# Patient Record
Sex: Male | Born: 1945 | Race: White | Hispanic: No | Smoking: Former smoker
Health system: Southern US, Community
[De-identification: ages and names within clinical notes are randomized; demographics above are authoritative.]

## PROBLEM LIST (undated history)

## (undated) DIAGNOSIS — C801 Malignant (primary) neoplasm, unspecified: Secondary | ICD-10-CM

## (undated) DIAGNOSIS — K5190 Ulcerative colitis, unspecified, without complications: Secondary | ICD-10-CM

## (undated) DIAGNOSIS — F32A Depression, unspecified: Secondary | ICD-10-CM

## (undated) DIAGNOSIS — G4730 Sleep apnea, unspecified: Secondary | ICD-10-CM

## (undated) DIAGNOSIS — K648 Other hemorrhoids: Secondary | ICD-10-CM

## (undated) DIAGNOSIS — F319 Bipolar disorder, unspecified: Secondary | ICD-10-CM

## (undated) DIAGNOSIS — R3911 Hesitancy of micturition: Secondary | ICD-10-CM

## (undated) DIAGNOSIS — M1990 Unspecified osteoarthritis, unspecified site: Secondary | ICD-10-CM

## (undated) DIAGNOSIS — M109 Gout, unspecified: Secondary | ICD-10-CM

## (undated) DIAGNOSIS — N2581 Secondary hyperparathyroidism of renal origin: Secondary | ICD-10-CM

## (undated) DIAGNOSIS — E291 Testicular hypofunction: Secondary | ICD-10-CM

## (undated) DIAGNOSIS — N472 Paraphimosis: Secondary | ICD-10-CM

## (undated) DIAGNOSIS — R350 Frequency of micturition: Secondary | ICD-10-CM

## (undated) DIAGNOSIS — Z974 Presence of external hearing-aid: Secondary | ICD-10-CM

## (undated) DIAGNOSIS — J939 Pneumothorax, unspecified: Secondary | ICD-10-CM

## (undated) DIAGNOSIS — N289 Disorder of kidney and ureter, unspecified: Secondary | ICD-10-CM

## (undated) DIAGNOSIS — N529 Male erectile dysfunction, unspecified: Secondary | ICD-10-CM

## (undated) DIAGNOSIS — I10 Essential (primary) hypertension: Secondary | ICD-10-CM

## (undated) DIAGNOSIS — I700 Atherosclerosis of aorta: Secondary | ICD-10-CM

## (undated) DIAGNOSIS — K589 Irritable bowel syndrome without diarrhea: Secondary | ICD-10-CM

## (undated) DIAGNOSIS — E785 Hyperlipidemia, unspecified: Secondary | ICD-10-CM

## (undated) DIAGNOSIS — K5090 Crohn's disease, unspecified, without complications: Secondary | ICD-10-CM

## (undated) DIAGNOSIS — N486 Induration penis plastica: Secondary | ICD-10-CM

## (undated) DIAGNOSIS — E0590 Thyrotoxicosis, unspecified without thyrotoxic crisis or storm: Secondary | ICD-10-CM

## (undated) DIAGNOSIS — N189 Chronic kidney disease, unspecified: Secondary | ICD-10-CM

## (undated) DIAGNOSIS — E232 Diabetes insipidus: Secondary | ICD-10-CM

## (undated) DIAGNOSIS — D649 Anemia, unspecified: Secondary | ICD-10-CM

## (undated) DIAGNOSIS — R7303 Prediabetes: Secondary | ICD-10-CM

## (undated) DIAGNOSIS — C4490 Unspecified malignant neoplasm of skin, unspecified: Secondary | ICD-10-CM

## (undated) DIAGNOSIS — I639 Cerebral infarction, unspecified: Secondary | ICD-10-CM

## (undated) DIAGNOSIS — F329 Major depressive disorder, single episode, unspecified: Secondary | ICD-10-CM

## (undated) HISTORY — PX: TONSILLECTOMY: SUR1361

## (undated) HISTORY — DX: Hesitancy of micturition: R39.11

## (undated) HISTORY — DX: Male erectile dysfunction, unspecified: N52.9

## (undated) HISTORY — DX: Diabetes insipidus: E23.2

## (undated) HISTORY — PX: MELANOMA EXCISION: SHX5266

## (undated) HISTORY — DX: Irritable bowel syndrome, unspecified: K58.9

## (undated) HISTORY — DX: Paraphimosis: N47.2

## (undated) HISTORY — DX: Induration penis plastica: N48.6

## (undated) HISTORY — PX: OTHER SURGICAL HISTORY: SHX169

## (undated) HISTORY — DX: Unspecified malignant neoplasm of skin, unspecified: C44.90

## (undated) HISTORY — DX: Hyperlipidemia, unspecified: E78.5

## (undated) HISTORY — DX: Essential (primary) hypertension: I10

## (undated) HISTORY — DX: Crohn's disease, unspecified, without complications: K50.90

## (undated) HISTORY — DX: Other hemorrhoids: K64.8

## (undated) HISTORY — DX: Testicular hypofunction: E29.1

## (undated) HISTORY — DX: Sleep apnea, unspecified: G47.30

## (undated) HISTORY — PX: PENILE PROSTHESIS IMPLANT: SHX240

## (undated) HISTORY — DX: Major depressive disorder, single episode, unspecified: F32.9

## (undated) HISTORY — PX: BONE MARROW BIOPSY: SHX199

## (undated) HISTORY — DX: Malignant (primary) neoplasm, unspecified: C80.1

## (undated) HISTORY — DX: Bipolar disorder, unspecified: F31.9

## (undated) HISTORY — DX: Depression, unspecified: F32.A

## (undated) HISTORY — PX: VASECTOMY: SHX75

## (undated) HISTORY — DX: Ulcerative colitis, unspecified, without complications: K51.90

## (undated) HISTORY — DX: Secondary hyperparathyroidism of renal origin: N25.81

## (undated) HISTORY — PX: SKIN CANCER EXCISION: SHX779

## (undated) HISTORY — DX: Anemia, unspecified: D64.9

## (undated) HISTORY — DX: Frequency of micturition: R35.0

## (undated) HISTORY — PX: COLONOSCOPY: SHX174

---

## 2004-07-15 ENCOUNTER — Ambulatory Visit: Payer: Self-pay | Admitting: Unknown Physician Specialty

## 2004-09-04 ENCOUNTER — Ambulatory Visit: Payer: Self-pay | Admitting: Unknown Physician Specialty

## 2005-07-14 ENCOUNTER — Ambulatory Visit: Payer: Self-pay | Admitting: Internal Medicine

## 2005-07-20 ENCOUNTER — Ambulatory Visit: Payer: Self-pay | Admitting: Internal Medicine

## 2005-08-04 ENCOUNTER — Ambulatory Visit: Payer: Self-pay | Admitting: Unknown Physician Specialty

## 2005-11-26 ENCOUNTER — Ambulatory Visit: Payer: Self-pay | Admitting: Internal Medicine

## 2006-01-20 ENCOUNTER — Ambulatory Visit: Payer: Self-pay | Admitting: Internal Medicine

## 2006-02-19 ENCOUNTER — Ambulatory Visit: Payer: Self-pay | Admitting: Internal Medicine

## 2006-03-07 ENCOUNTER — Ambulatory Visit: Payer: Self-pay | Admitting: Urology

## 2006-03-30 ENCOUNTER — Inpatient Hospital Stay: Payer: Self-pay | Admitting: Vascular Surgery

## 2006-04-12 ENCOUNTER — Ambulatory Visit: Payer: Self-pay | Admitting: Vascular Surgery

## 2006-06-20 ENCOUNTER — Ambulatory Visit: Payer: Self-pay | Admitting: Unknown Physician Specialty

## 2006-07-19 ENCOUNTER — Ambulatory Visit: Payer: Self-pay | Admitting: Internal Medicine

## 2006-07-21 ENCOUNTER — Ambulatory Visit: Payer: Self-pay | Admitting: Internal Medicine

## 2006-11-22 ENCOUNTER — Ambulatory Visit: Payer: Self-pay | Admitting: Family Medicine

## 2007-01-20 ENCOUNTER — Ambulatory Visit: Payer: Self-pay | Admitting: Internal Medicine

## 2007-01-21 ENCOUNTER — Ambulatory Visit: Payer: Self-pay | Admitting: Internal Medicine

## 2008-01-02 ENCOUNTER — Ambulatory Visit: Payer: Self-pay | Admitting: Urology

## 2008-01-17 ENCOUNTER — Ambulatory Visit: Payer: Self-pay | Admitting: Urology

## 2008-01-21 ENCOUNTER — Ambulatory Visit: Payer: Self-pay | Admitting: Internal Medicine

## 2008-02-02 ENCOUNTER — Ambulatory Visit: Payer: Self-pay | Admitting: Internal Medicine

## 2008-02-20 ENCOUNTER — Ambulatory Visit: Payer: Self-pay | Admitting: Internal Medicine

## 2008-03-19 ENCOUNTER — Ambulatory Visit: Payer: Self-pay | Admitting: Nephrology

## 2008-04-09 ENCOUNTER — Ambulatory Visit: Payer: Self-pay | Admitting: Family Medicine

## 2008-07-31 ENCOUNTER — Ambulatory Visit: Payer: Self-pay | Admitting: Unknown Physician Specialty

## 2009-01-20 ENCOUNTER — Ambulatory Visit: Payer: Self-pay | Admitting: Internal Medicine

## 2009-01-28 ENCOUNTER — Ambulatory Visit: Payer: Self-pay | Admitting: Internal Medicine

## 2009-02-19 ENCOUNTER — Ambulatory Visit: Payer: Self-pay | Admitting: Internal Medicine

## 2009-06-11 ENCOUNTER — Ambulatory Visit: Payer: Self-pay | Admitting: Psychiatry

## 2009-07-11 ENCOUNTER — Ambulatory Visit: Payer: Self-pay | Admitting: Psychiatry

## 2010-01-23 ENCOUNTER — Ambulatory Visit: Payer: Self-pay | Admitting: Internal Medicine

## 2010-02-19 ENCOUNTER — Ambulatory Visit: Payer: Self-pay | Admitting: Internal Medicine

## 2010-05-05 ENCOUNTER — Ambulatory Visit: Payer: Self-pay | Admitting: Family Medicine

## 2010-07-24 ENCOUNTER — Ambulatory Visit: Payer: Self-pay | Admitting: Internal Medicine

## 2010-08-21 ENCOUNTER — Ambulatory Visit: Payer: Self-pay | Admitting: Internal Medicine

## 2010-09-02 ENCOUNTER — Inpatient Hospital Stay: Payer: Self-pay | Admitting: Psychiatry

## 2011-01-25 ENCOUNTER — Ambulatory Visit: Payer: Self-pay | Admitting: Internal Medicine

## 2011-01-28 ENCOUNTER — Ambulatory Visit: Payer: Self-pay | Admitting: Ophthalmology

## 2011-02-20 ENCOUNTER — Ambulatory Visit: Payer: Self-pay | Admitting: Internal Medicine

## 2011-03-25 DIAGNOSIS — F39 Unspecified mood [affective] disorder: Secondary | ICD-10-CM | POA: Diagnosis not present

## 2011-03-29 DIAGNOSIS — N401 Enlarged prostate with lower urinary tract symptoms: Secondary | ICD-10-CM | POA: Diagnosis not present

## 2011-03-29 DIAGNOSIS — E291 Testicular hypofunction: Secondary | ICD-10-CM | POA: Diagnosis not present

## 2011-04-15 DIAGNOSIS — N189 Chronic kidney disease, unspecified: Secondary | ICD-10-CM | POA: Diagnosis not present

## 2011-04-15 DIAGNOSIS — K5190 Ulcerative colitis, unspecified, without complications: Secondary | ICD-10-CM | POA: Diagnosis not present

## 2011-04-15 DIAGNOSIS — F3130 Bipolar disorder, current episode depressed, mild or moderate severity, unspecified: Secondary | ICD-10-CM | POA: Diagnosis not present

## 2011-04-15 DIAGNOSIS — I10 Essential (primary) hypertension: Secondary | ICD-10-CM | POA: Diagnosis not present

## 2011-04-27 DIAGNOSIS — E232 Diabetes insipidus: Secondary | ICD-10-CM | POA: Diagnosis not present

## 2011-04-27 DIAGNOSIS — I10 Essential (primary) hypertension: Secondary | ICD-10-CM | POA: Diagnosis not present

## 2011-05-12 DIAGNOSIS — R972 Elevated prostate specific antigen [PSA]: Secondary | ICD-10-CM | POA: Diagnosis not present

## 2011-05-24 DIAGNOSIS — F39 Unspecified mood [affective] disorder: Secondary | ICD-10-CM | POA: Diagnosis not present

## 2011-05-25 DIAGNOSIS — R972 Elevated prostate specific antigen [PSA]: Secondary | ICD-10-CM | POA: Diagnosis not present

## 2011-05-28 DIAGNOSIS — I10 Essential (primary) hypertension: Secondary | ICD-10-CM | POA: Diagnosis not present

## 2011-05-28 DIAGNOSIS — R7301 Impaired fasting glucose: Secondary | ICD-10-CM | POA: Diagnosis not present

## 2011-05-28 DIAGNOSIS — N189 Chronic kidney disease, unspecified: Secondary | ICD-10-CM | POA: Diagnosis not present

## 2011-05-28 DIAGNOSIS — F39 Unspecified mood [affective] disorder: Secondary | ICD-10-CM | POA: Diagnosis not present

## 2011-05-28 DIAGNOSIS — E785 Hyperlipidemia, unspecified: Secondary | ICD-10-CM | POA: Diagnosis not present

## 2011-07-21 DIAGNOSIS — L820 Inflamed seborrheic keratosis: Secondary | ICD-10-CM | POA: Diagnosis not present

## 2011-07-21 DIAGNOSIS — D485 Neoplasm of uncertain behavior of skin: Secondary | ICD-10-CM | POA: Diagnosis not present

## 2011-07-21 DIAGNOSIS — D239 Other benign neoplasm of skin, unspecified: Secondary | ICD-10-CM | POA: Diagnosis not present

## 2011-07-26 DIAGNOSIS — F39 Unspecified mood [affective] disorder: Secondary | ICD-10-CM | POA: Diagnosis not present

## 2011-08-10 DIAGNOSIS — E0500 Thyrotoxicosis with diffuse goiter without thyrotoxic crisis or storm: Secondary | ICD-10-CM | POA: Diagnosis not present

## 2011-08-12 DIAGNOSIS — E785 Hyperlipidemia, unspecified: Secondary | ICD-10-CM | POA: Diagnosis not present

## 2011-08-12 DIAGNOSIS — E039 Hypothyroidism, unspecified: Secondary | ICD-10-CM | POA: Diagnosis not present

## 2011-08-12 DIAGNOSIS — I10 Essential (primary) hypertension: Secondary | ICD-10-CM | POA: Diagnosis not present

## 2011-08-12 DIAGNOSIS — F3130 Bipolar disorder, current episode depressed, mild or moderate severity, unspecified: Secondary | ICD-10-CM | POA: Diagnosis not present

## 2011-08-12 DIAGNOSIS — Z125 Encounter for screening for malignant neoplasm of prostate: Secondary | ICD-10-CM | POA: Diagnosis not present

## 2011-08-12 DIAGNOSIS — Z0000 Encounter for general adult medical examination without abnormal findings: Secondary | ICD-10-CM | POA: Diagnosis not present

## 2011-08-12 DIAGNOSIS — S0180XA Unspecified open wound of other part of head, initial encounter: Secondary | ICD-10-CM | POA: Diagnosis not present

## 2011-08-12 DIAGNOSIS — N189 Chronic kidney disease, unspecified: Secondary | ICD-10-CM | POA: Diagnosis not present

## 2011-08-12 DIAGNOSIS — K5190 Ulcerative colitis, unspecified, without complications: Secondary | ICD-10-CM | POA: Diagnosis not present

## 2011-08-23 DIAGNOSIS — D472 Monoclonal gammopathy: Secondary | ICD-10-CM | POA: Diagnosis not present

## 2011-08-23 DIAGNOSIS — I10 Essential (primary) hypertension: Secondary | ICD-10-CM | POA: Diagnosis not present

## 2011-08-23 DIAGNOSIS — E232 Diabetes insipidus: Secondary | ICD-10-CM | POA: Diagnosis not present

## 2011-10-06 DIAGNOSIS — R972 Elevated prostate specific antigen [PSA]: Secondary | ICD-10-CM | POA: Diagnosis not present

## 2011-10-06 DIAGNOSIS — E291 Testicular hypofunction: Secondary | ICD-10-CM | POA: Diagnosis not present

## 2011-10-07 DIAGNOSIS — R946 Abnormal results of thyroid function studies: Secondary | ICD-10-CM | POA: Diagnosis not present

## 2011-10-11 DIAGNOSIS — R972 Elevated prostate specific antigen [PSA]: Secondary | ICD-10-CM | POA: Diagnosis not present

## 2011-10-11 DIAGNOSIS — N529 Male erectile dysfunction, unspecified: Secondary | ICD-10-CM | POA: Diagnosis not present

## 2011-10-14 DIAGNOSIS — R946 Abnormal results of thyroid function studies: Secondary | ICD-10-CM | POA: Diagnosis not present

## 2011-10-28 DIAGNOSIS — Z86010 Personal history of colonic polyps: Secondary | ICD-10-CM | POA: Diagnosis not present

## 2011-11-17 DIAGNOSIS — F39 Unspecified mood [affective] disorder: Secondary | ICD-10-CM | POA: Diagnosis not present

## 2011-11-19 DIAGNOSIS — D239 Other benign neoplasm of skin, unspecified: Secondary | ICD-10-CM | POA: Diagnosis not present

## 2011-11-19 DIAGNOSIS — D485 Neoplasm of uncertain behavior of skin: Secondary | ICD-10-CM | POA: Diagnosis not present

## 2011-11-25 ENCOUNTER — Ambulatory Visit: Payer: Self-pay | Admitting: Unknown Physician Specialty

## 2011-11-25 DIAGNOSIS — Z09 Encounter for follow-up examination after completed treatment for conditions other than malignant neoplasm: Secondary | ICD-10-CM | POA: Diagnosis not present

## 2011-11-25 DIAGNOSIS — K621 Rectal polyp: Secondary | ICD-10-CM | POA: Diagnosis not present

## 2011-11-25 DIAGNOSIS — Z860100 Personal history of colon polyps, unspecified: Secondary | ICD-10-CM | POA: Diagnosis not present

## 2011-11-25 DIAGNOSIS — D126 Benign neoplasm of colon, unspecified: Secondary | ICD-10-CM | POA: Diagnosis not present

## 2011-11-25 DIAGNOSIS — K5190 Ulcerative colitis, unspecified, without complications: Secondary | ICD-10-CM | POA: Diagnosis not present

## 2011-11-25 DIAGNOSIS — I129 Hypertensive chronic kidney disease with stage 1 through stage 4 chronic kidney disease, or unspecified chronic kidney disease: Secondary | ICD-10-CM | POA: Diagnosis not present

## 2011-11-25 DIAGNOSIS — K648 Other hemorrhoids: Secondary | ICD-10-CM | POA: Diagnosis not present

## 2011-11-25 DIAGNOSIS — G4730 Sleep apnea, unspecified: Secondary | ICD-10-CM | POA: Diagnosis not present

## 2011-11-25 DIAGNOSIS — N189 Chronic kidney disease, unspecified: Secondary | ICD-10-CM | POA: Diagnosis not present

## 2011-11-25 DIAGNOSIS — D649 Anemia, unspecified: Secondary | ICD-10-CM | POA: Diagnosis not present

## 2011-11-25 DIAGNOSIS — K620 Anal polyp: Secondary | ICD-10-CM | POA: Diagnosis not present

## 2011-11-25 DIAGNOSIS — D128 Benign neoplasm of rectum: Secondary | ICD-10-CM | POA: Diagnosis not present

## 2011-11-25 DIAGNOSIS — S058X9A Other injuries of unspecified eye and orbit, initial encounter: Secondary | ICD-10-CM | POA: Diagnosis not present

## 2011-11-25 DIAGNOSIS — Z79899 Other long term (current) drug therapy: Secondary | ICD-10-CM | POA: Diagnosis not present

## 2011-11-25 DIAGNOSIS — F319 Bipolar disorder, unspecified: Secondary | ICD-10-CM | POA: Diagnosis not present

## 2011-11-25 DIAGNOSIS — Z85820 Personal history of malignant melanoma of skin: Secondary | ICD-10-CM | POA: Diagnosis not present

## 2011-11-26 DIAGNOSIS — K5180 Other ulcerative colitis without complications: Secondary | ICD-10-CM | POA: Diagnosis not present

## 2011-11-26 DIAGNOSIS — Z86010 Personal history of colonic polyps: Secondary | ICD-10-CM | POA: Diagnosis not present

## 2011-11-29 DIAGNOSIS — S058X9A Other injuries of unspecified eye and orbit, initial encounter: Secondary | ICD-10-CM | POA: Diagnosis not present

## 2011-12-13 DIAGNOSIS — E232 Diabetes insipidus: Secondary | ICD-10-CM | POA: Diagnosis not present

## 2011-12-13 DIAGNOSIS — I10 Essential (primary) hypertension: Secondary | ICD-10-CM | POA: Diagnosis not present

## 2011-12-13 DIAGNOSIS — D472 Monoclonal gammopathy: Secondary | ICD-10-CM | POA: Diagnosis not present

## 2011-12-15 DIAGNOSIS — D239 Other benign neoplasm of skin, unspecified: Secondary | ICD-10-CM | POA: Diagnosis not present

## 2011-12-15 DIAGNOSIS — D485 Neoplasm of uncertain behavior of skin: Secondary | ICD-10-CM | POA: Diagnosis not present

## 2012-01-06 DIAGNOSIS — F39 Unspecified mood [affective] disorder: Secondary | ICD-10-CM | POA: Diagnosis not present

## 2012-01-13 DIAGNOSIS — N401 Enlarged prostate with lower urinary tract symptoms: Secondary | ICD-10-CM | POA: Insufficient documentation

## 2012-01-13 DIAGNOSIS — N138 Other obstructive and reflux uropathy: Secondary | ICD-10-CM | POA: Insufficient documentation

## 2012-01-13 DIAGNOSIS — R351 Nocturia: Secondary | ICD-10-CM | POA: Insufficient documentation

## 2012-01-13 DIAGNOSIS — N529 Male erectile dysfunction, unspecified: Secondary | ICD-10-CM | POA: Insufficient documentation

## 2012-01-13 DIAGNOSIS — R972 Elevated prostate specific antigen [PSA]: Secondary | ICD-10-CM | POA: Diagnosis not present

## 2012-01-13 DIAGNOSIS — N486 Induration penis plastica: Secondary | ICD-10-CM | POA: Insufficient documentation

## 2012-01-13 DIAGNOSIS — R3911 Hesitancy of micturition: Secondary | ICD-10-CM | POA: Insufficient documentation

## 2012-01-13 DIAGNOSIS — E291 Testicular hypofunction: Secondary | ICD-10-CM | POA: Insufficient documentation

## 2012-01-13 DIAGNOSIS — R350 Frequency of micturition: Secondary | ICD-10-CM | POA: Insufficient documentation

## 2012-01-21 DIAGNOSIS — Z85820 Personal history of malignant melanoma of skin: Secondary | ICD-10-CM | POA: Diagnosis not present

## 2012-01-21 DIAGNOSIS — L570 Actinic keratosis: Secondary | ICD-10-CM | POA: Diagnosis not present

## 2012-01-25 ENCOUNTER — Ambulatory Visit: Payer: Self-pay | Admitting: Internal Medicine

## 2012-01-25 DIAGNOSIS — E785 Hyperlipidemia, unspecified: Secondary | ICD-10-CM | POA: Diagnosis not present

## 2012-01-25 DIAGNOSIS — Z79899 Other long term (current) drug therapy: Secondary | ICD-10-CM | POA: Diagnosis not present

## 2012-01-25 DIAGNOSIS — D472 Monoclonal gammopathy: Secondary | ICD-10-CM | POA: Diagnosis not present

## 2012-01-25 DIAGNOSIS — K5090 Crohn's disease, unspecified, without complications: Secondary | ICD-10-CM | POA: Diagnosis not present

## 2012-01-25 DIAGNOSIS — D72819 Decreased white blood cell count, unspecified: Secondary | ICD-10-CM | POA: Diagnosis not present

## 2012-01-25 DIAGNOSIS — I10 Essential (primary) hypertension: Secondary | ICD-10-CM | POA: Diagnosis not present

## 2012-01-25 DIAGNOSIS — D509 Iron deficiency anemia, unspecified: Secondary | ICD-10-CM | POA: Diagnosis not present

## 2012-01-25 LAB — CBC CANCER CENTER
Eosinophil #: 0.2 x10 3/mm (ref 0.0–0.7)
Eosinophil %: 4.8 %
Lymphocyte %: 16.7 %
MCH: 33.6 pg (ref 26.0–34.0)
MCV: 99 fL (ref 80–100)
Monocyte #: 0.3 x10 3/mm (ref 0.2–1.0)
Neutrophil %: 70.5 %
Platelet: 130 x10 3/mm — ABNORMAL LOW (ref 150–440)
RBC: 4.11 10*6/uL — ABNORMAL LOW (ref 4.40–5.90)
RDW: 13.2 % (ref 11.5–14.5)

## 2012-01-25 LAB — CALCIUM: Calcium, Total: 9.5 mg/dL (ref 8.5–10.1)

## 2012-01-25 LAB — CREATININE, SERUM: EGFR (Non-African Amer.): 38 — ABNORMAL LOW

## 2012-01-27 DIAGNOSIS — R972 Elevated prostate specific antigen [PSA]: Secondary | ICD-10-CM | POA: Diagnosis not present

## 2012-02-02 DIAGNOSIS — D696 Thrombocytopenia, unspecified: Secondary | ICD-10-CM | POA: Diagnosis not present

## 2012-02-08 DIAGNOSIS — E785 Hyperlipidemia, unspecified: Secondary | ICD-10-CM | POA: Diagnosis not present

## 2012-02-08 DIAGNOSIS — Z23 Encounter for immunization: Secondary | ICD-10-CM | POA: Diagnosis not present

## 2012-02-20 ENCOUNTER — Ambulatory Visit: Payer: Self-pay | Admitting: Internal Medicine

## 2012-02-20 DIAGNOSIS — D472 Monoclonal gammopathy: Secondary | ICD-10-CM | POA: Diagnosis not present

## 2012-02-20 DIAGNOSIS — K5090 Crohn's disease, unspecified, without complications: Secondary | ICD-10-CM | POA: Diagnosis not present

## 2012-02-20 DIAGNOSIS — E785 Hyperlipidemia, unspecified: Secondary | ICD-10-CM | POA: Diagnosis not present

## 2012-02-20 DIAGNOSIS — D649 Anemia, unspecified: Secondary | ICD-10-CM | POA: Diagnosis not present

## 2012-02-20 DIAGNOSIS — Z79899 Other long term (current) drug therapy: Secondary | ICD-10-CM | POA: Diagnosis not present

## 2012-02-20 DIAGNOSIS — D72819 Decreased white blood cell count, unspecified: Secondary | ICD-10-CM | POA: Diagnosis not present

## 2012-02-20 DIAGNOSIS — N189 Chronic kidney disease, unspecified: Secondary | ICD-10-CM | POA: Diagnosis not present

## 2012-02-20 DIAGNOSIS — I10 Essential (primary) hypertension: Secondary | ICD-10-CM | POA: Diagnosis not present

## 2012-02-22 DIAGNOSIS — D649 Anemia, unspecified: Secondary | ICD-10-CM | POA: Diagnosis not present

## 2012-02-22 DIAGNOSIS — D472 Monoclonal gammopathy: Secondary | ICD-10-CM | POA: Diagnosis not present

## 2012-02-22 DIAGNOSIS — D72819 Decreased white blood cell count, unspecified: Secondary | ICD-10-CM | POA: Diagnosis not present

## 2012-02-22 LAB — CBC CANCER CENTER
Basophil: 1 %
Comment - H1-Com2: NORMAL
Eosinophil: 5 %
HGB: 13.8 g/dL (ref 13.0–18.0)
Lymphocytes: 11 %
MCH: 33.8 pg (ref 26.0–34.0)
MCV: 97 fL (ref 80–100)
RBC: 4.07 10*6/uL — ABNORMAL LOW (ref 4.40–5.90)
Variant Lymphocyte: 5 %

## 2012-02-22 LAB — RETICULOCYTES
Absolute Retic Count: 0.0574 10*6/uL (ref 0.031–0.129)
Reticulocyte: 1.41 % (ref 0.7–2.5)

## 2012-02-22 LAB — PROTIME-INR
INR: 0.9
Prothrombin Time: 12.4 secs (ref 11.5–14.7)

## 2012-02-22 LAB — FOLATE: Folic Acid: 66.4 ng/mL (ref 3.1–100.0)

## 2012-02-22 LAB — APTT: Activated PTT: 31.4 secs (ref 23.6–35.9)

## 2012-02-22 LAB — IRON AND TIBC: Unbound Iron-Bind.Cap.: 167 ug/dL

## 2012-02-22 LAB — LACTATE DEHYDROGENASE: LDH: 237 U/L (ref 85–241)

## 2012-03-07 DIAGNOSIS — Z79899 Other long term (current) drug therapy: Secondary | ICD-10-CM | POA: Diagnosis not present

## 2012-03-07 DIAGNOSIS — D649 Anemia, unspecified: Secondary | ICD-10-CM | POA: Diagnosis not present

## 2012-03-07 DIAGNOSIS — D72819 Decreased white blood cell count, unspecified: Secondary | ICD-10-CM | POA: Diagnosis not present

## 2012-03-07 DIAGNOSIS — D472 Monoclonal gammopathy: Secondary | ICD-10-CM | POA: Diagnosis not present

## 2012-03-22 ENCOUNTER — Ambulatory Visit: Payer: Self-pay | Admitting: Internal Medicine

## 2012-04-20 DIAGNOSIS — I10 Essential (primary) hypertension: Secondary | ICD-10-CM | POA: Diagnosis not present

## 2012-04-20 DIAGNOSIS — E232 Diabetes insipidus: Secondary | ICD-10-CM | POA: Diagnosis not present

## 2012-04-20 DIAGNOSIS — N2581 Secondary hyperparathyroidism of renal origin: Secondary | ICD-10-CM | POA: Diagnosis not present

## 2012-04-28 DIAGNOSIS — R972 Elevated prostate specific antigen [PSA]: Secondary | ICD-10-CM | POA: Diagnosis not present

## 2012-05-12 DIAGNOSIS — F39 Unspecified mood [affective] disorder: Secondary | ICD-10-CM | POA: Diagnosis not present

## 2012-05-15 DIAGNOSIS — R972 Elevated prostate specific antigen [PSA]: Secondary | ICD-10-CM | POA: Diagnosis not present

## 2012-08-10 DIAGNOSIS — N2581 Secondary hyperparathyroidism of renal origin: Secondary | ICD-10-CM | POA: Diagnosis not present

## 2012-08-10 DIAGNOSIS — E232 Diabetes insipidus: Secondary | ICD-10-CM | POA: Diagnosis not present

## 2012-08-10 DIAGNOSIS — I10 Essential (primary) hypertension: Secondary | ICD-10-CM | POA: Diagnosis not present

## 2012-08-16 DIAGNOSIS — N189 Chronic kidney disease, unspecified: Secondary | ICD-10-CM | POA: Diagnosis not present

## 2012-08-16 DIAGNOSIS — I10 Essential (primary) hypertension: Secondary | ICD-10-CM | POA: Diagnosis not present

## 2012-08-16 DIAGNOSIS — Z0000 Encounter for general adult medical examination without abnormal findings: Secondary | ICD-10-CM | POA: Diagnosis not present

## 2012-08-17 DIAGNOSIS — E785 Hyperlipidemia, unspecified: Secondary | ICD-10-CM | POA: Diagnosis not present

## 2012-08-17 DIAGNOSIS — Z0000 Encounter for general adult medical examination without abnormal findings: Secondary | ICD-10-CM | POA: Diagnosis not present

## 2012-08-17 DIAGNOSIS — I10 Essential (primary) hypertension: Secondary | ICD-10-CM | POA: Diagnosis not present

## 2012-08-17 DIAGNOSIS — Z125 Encounter for screening for malignant neoplasm of prostate: Secondary | ICD-10-CM | POA: Diagnosis not present

## 2012-08-17 DIAGNOSIS — N189 Chronic kidney disease, unspecified: Secondary | ICD-10-CM | POA: Diagnosis not present

## 2012-08-18 DIAGNOSIS — F39 Unspecified mood [affective] disorder: Secondary | ICD-10-CM | POA: Diagnosis not present

## 2012-10-06 DIAGNOSIS — R946 Abnormal results of thyroid function studies: Secondary | ICD-10-CM | POA: Diagnosis not present

## 2012-10-12 DIAGNOSIS — R946 Abnormal results of thyroid function studies: Secondary | ICD-10-CM | POA: Diagnosis not present

## 2012-11-27 DIAGNOSIS — R972 Elevated prostate specific antigen [PSA]: Secondary | ICD-10-CM | POA: Diagnosis not present

## 2012-11-30 DIAGNOSIS — I10 Essential (primary) hypertension: Secondary | ICD-10-CM | POA: Diagnosis not present

## 2012-11-30 DIAGNOSIS — N2581 Secondary hyperparathyroidism of renal origin: Secondary | ICD-10-CM | POA: Diagnosis not present

## 2012-11-30 DIAGNOSIS — E232 Diabetes insipidus: Secondary | ICD-10-CM | POA: Diagnosis not present

## 2013-01-25 DIAGNOSIS — L821 Other seborrheic keratosis: Secondary | ICD-10-CM | POA: Diagnosis not present

## 2013-01-25 DIAGNOSIS — Z85820 Personal history of malignant melanoma of skin: Secondary | ICD-10-CM | POA: Diagnosis not present

## 2013-01-25 DIAGNOSIS — D485 Neoplasm of uncertain behavior of skin: Secondary | ICD-10-CM | POA: Diagnosis not present

## 2013-01-25 DIAGNOSIS — L570 Actinic keratosis: Secondary | ICD-10-CM | POA: Diagnosis not present

## 2013-01-25 DIAGNOSIS — D235 Other benign neoplasm of skin of trunk: Secondary | ICD-10-CM | POA: Diagnosis not present

## 2013-02-05 DIAGNOSIS — Z23 Encounter for immunization: Secondary | ICD-10-CM | POA: Diagnosis not present

## 2013-02-08 DIAGNOSIS — D485 Neoplasm of uncertain behavior of skin: Secondary | ICD-10-CM | POA: Diagnosis not present

## 2013-02-22 DIAGNOSIS — F39 Unspecified mood [affective] disorder: Secondary | ICD-10-CM | POA: Diagnosis not present

## 2013-03-01 DIAGNOSIS — I10 Essential (primary) hypertension: Secondary | ICD-10-CM | POA: Diagnosis not present

## 2013-03-07 ENCOUNTER — Ambulatory Visit: Payer: Self-pay | Admitting: Internal Medicine

## 2013-03-07 DIAGNOSIS — F329 Major depressive disorder, single episode, unspecified: Secondary | ICD-10-CM | POA: Diagnosis not present

## 2013-03-07 DIAGNOSIS — D472 Monoclonal gammopathy: Secondary | ICD-10-CM | POA: Diagnosis not present

## 2013-03-07 DIAGNOSIS — D72819 Decreased white blood cell count, unspecified: Secondary | ICD-10-CM | POA: Diagnosis not present

## 2013-03-07 DIAGNOSIS — E785 Hyperlipidemia, unspecified: Secondary | ICD-10-CM | POA: Diagnosis not present

## 2013-03-07 DIAGNOSIS — K5090 Crohn's disease, unspecified, without complications: Secondary | ICD-10-CM | POA: Diagnosis not present

## 2013-03-07 DIAGNOSIS — N189 Chronic kidney disease, unspecified: Secondary | ICD-10-CM | POA: Diagnosis not present

## 2013-03-07 DIAGNOSIS — Z85820 Personal history of malignant melanoma of skin: Secondary | ICD-10-CM | POA: Diagnosis not present

## 2013-03-07 DIAGNOSIS — Z79899 Other long term (current) drug therapy: Secondary | ICD-10-CM | POA: Diagnosis not present

## 2013-03-07 DIAGNOSIS — D649 Anemia, unspecified: Secondary | ICD-10-CM | POA: Diagnosis not present

## 2013-03-07 DIAGNOSIS — I10 Essential (primary) hypertension: Secondary | ICD-10-CM | POA: Diagnosis not present

## 2013-03-07 LAB — CALCIUM: Calcium, Total: 9.5 mg/dL (ref 8.5–10.1)

## 2013-03-07 LAB — CBC CANCER CENTER
Basophil #: 0 x10 3/mm (ref 0.0–0.1)
HCT: 40.2 % (ref 40.0–52.0)
Lymphocyte #: 0.7 x10 3/mm — ABNORMAL LOW (ref 1.0–3.6)
MCH: 33.6 pg (ref 26.0–34.0)
MCHC: 34 g/dL (ref 32.0–36.0)
Monocyte #: 0.3 x10 3/mm (ref 0.2–1.0)
Neutrophil #: 2.5 x10 3/mm (ref 1.4–6.5)

## 2013-03-07 LAB — CREATININE, SERUM: Creatinine: 1.99 mg/dL — ABNORMAL HIGH (ref 0.60–1.30)

## 2013-03-08 DIAGNOSIS — L820 Inflamed seborrheic keratosis: Secondary | ICD-10-CM | POA: Diagnosis not present

## 2013-03-13 DIAGNOSIS — I10 Essential (primary) hypertension: Secondary | ICD-10-CM | POA: Diagnosis not present

## 2013-03-13 DIAGNOSIS — N2581 Secondary hyperparathyroidism of renal origin: Secondary | ICD-10-CM | POA: Diagnosis not present

## 2013-03-13 DIAGNOSIS — E232 Diabetes insipidus: Secondary | ICD-10-CM | POA: Diagnosis not present

## 2013-03-20 DIAGNOSIS — N2581 Secondary hyperparathyroidism of renal origin: Secondary | ICD-10-CM | POA: Diagnosis not present

## 2013-03-20 DIAGNOSIS — E232 Diabetes insipidus: Secondary | ICD-10-CM | POA: Diagnosis not present

## 2013-03-22 ENCOUNTER — Ambulatory Visit: Payer: Self-pay | Admitting: Internal Medicine

## 2013-06-04 DIAGNOSIS — N401 Enlarged prostate with lower urinary tract symptoms: Secondary | ICD-10-CM | POA: Diagnosis not present

## 2013-06-04 DIAGNOSIS — N529 Male erectile dysfunction, unspecified: Secondary | ICD-10-CM | POA: Diagnosis not present

## 2013-06-04 DIAGNOSIS — R972 Elevated prostate specific antigen [PSA]: Secondary | ICD-10-CM | POA: Diagnosis not present

## 2013-07-11 DIAGNOSIS — I10 Essential (primary) hypertension: Secondary | ICD-10-CM | POA: Diagnosis not present

## 2013-07-11 DIAGNOSIS — N251 Nephrogenic diabetes insipidus: Secondary | ICD-10-CM | POA: Diagnosis not present

## 2013-07-11 DIAGNOSIS — E232 Diabetes insipidus: Secondary | ICD-10-CM | POA: Diagnosis not present

## 2013-07-11 DIAGNOSIS — N183 Chronic kidney disease, stage 3 unspecified: Secondary | ICD-10-CM | POA: Diagnosis not present

## 2013-07-11 DIAGNOSIS — N2581 Secondary hyperparathyroidism of renal origin: Secondary | ICD-10-CM | POA: Diagnosis not present

## 2013-08-08 DIAGNOSIS — L821 Other seborrheic keratosis: Secondary | ICD-10-CM | POA: Diagnosis not present

## 2013-08-08 DIAGNOSIS — D235 Other benign neoplasm of skin of trunk: Secondary | ICD-10-CM | POA: Diagnosis not present

## 2013-08-08 DIAGNOSIS — D485 Neoplasm of uncertain behavior of skin: Secondary | ICD-10-CM | POA: Diagnosis not present

## 2013-08-08 DIAGNOSIS — Z85820 Personal history of malignant melanoma of skin: Secondary | ICD-10-CM | POA: Diagnosis not present

## 2013-08-20 DIAGNOSIS — I10 Essential (primary) hypertension: Secondary | ICD-10-CM | POA: Diagnosis not present

## 2013-08-20 DIAGNOSIS — Z125 Encounter for screening for malignant neoplasm of prostate: Secondary | ICD-10-CM | POA: Diagnosis not present

## 2013-08-20 DIAGNOSIS — N189 Chronic kidney disease, unspecified: Secondary | ICD-10-CM | POA: Diagnosis not present

## 2013-08-20 DIAGNOSIS — Z0000 Encounter for general adult medical examination without abnormal findings: Secondary | ICD-10-CM | POA: Diagnosis not present

## 2013-08-20 DIAGNOSIS — E785 Hyperlipidemia, unspecified: Secondary | ICD-10-CM | POA: Diagnosis not present

## 2013-08-23 ENCOUNTER — Ambulatory Visit: Payer: Self-pay | Admitting: Family Medicine

## 2013-08-23 DIAGNOSIS — R1900 Intra-abdominal and pelvic swelling, mass and lump, unspecified site: Secondary | ICD-10-CM | POA: Diagnosis not present

## 2013-08-23 DIAGNOSIS — I10 Essential (primary) hypertension: Secondary | ICD-10-CM | POA: Diagnosis not present

## 2013-08-23 DIAGNOSIS — N281 Cyst of kidney, acquired: Secondary | ICD-10-CM | POA: Diagnosis not present

## 2013-08-23 DIAGNOSIS — N189 Chronic kidney disease, unspecified: Secondary | ICD-10-CM | POA: Diagnosis not present

## 2013-08-28 DIAGNOSIS — F39 Unspecified mood [affective] disorder: Secondary | ICD-10-CM | POA: Diagnosis not present

## 2013-08-28 DIAGNOSIS — G3184 Mild cognitive impairment, so stated: Secondary | ICD-10-CM | POA: Diagnosis not present

## 2013-08-30 DIAGNOSIS — D235 Other benign neoplasm of skin of trunk: Secondary | ICD-10-CM | POA: Diagnosis not present

## 2013-08-30 DIAGNOSIS — D485 Neoplasm of uncertain behavior of skin: Secondary | ICD-10-CM | POA: Diagnosis not present

## 2013-10-01 DIAGNOSIS — R946 Abnormal results of thyroid function studies: Secondary | ICD-10-CM | POA: Insufficient documentation

## 2013-10-11 DIAGNOSIS — R946 Abnormal results of thyroid function studies: Secondary | ICD-10-CM | POA: Diagnosis not present

## 2013-10-18 DIAGNOSIS — Z79899 Other long term (current) drug therapy: Secondary | ICD-10-CM | POA: Insufficient documentation

## 2013-10-18 DIAGNOSIS — R946 Abnormal results of thyroid function studies: Secondary | ICD-10-CM | POA: Diagnosis not present

## 2013-10-30 DIAGNOSIS — N2581 Secondary hyperparathyroidism of renal origin: Secondary | ICD-10-CM | POA: Diagnosis not present

## 2013-10-30 DIAGNOSIS — I10 Essential (primary) hypertension: Secondary | ICD-10-CM | POA: Diagnosis not present

## 2013-10-30 DIAGNOSIS — N183 Chronic kidney disease, stage 3 unspecified: Secondary | ICD-10-CM | POA: Diagnosis not present

## 2013-10-30 DIAGNOSIS — E232 Diabetes insipidus: Secondary | ICD-10-CM | POA: Diagnosis not present

## 2013-11-27 DIAGNOSIS — R972 Elevated prostate specific antigen [PSA]: Secondary | ICD-10-CM | POA: Diagnosis not present

## 2013-11-27 DIAGNOSIS — N400 Enlarged prostate without lower urinary tract symptoms: Secondary | ICD-10-CM | POA: Diagnosis not present

## 2013-11-27 DIAGNOSIS — N529 Male erectile dysfunction, unspecified: Secondary | ICD-10-CM | POA: Diagnosis not present

## 2013-11-27 DIAGNOSIS — N486 Induration penis plastica: Secondary | ICD-10-CM | POA: Diagnosis not present

## 2013-12-18 DIAGNOSIS — H2510 Age-related nuclear cataract, unspecified eye: Secondary | ICD-10-CM | POA: Diagnosis not present

## 2014-01-04 DIAGNOSIS — K045 Chronic apical periodontitis: Secondary | ICD-10-CM | POA: Diagnosis not present

## 2014-01-23 DIAGNOSIS — N529 Male erectile dysfunction, unspecified: Secondary | ICD-10-CM | POA: Diagnosis not present

## 2014-01-24 DIAGNOSIS — J3489 Other specified disorders of nose and nasal sinuses: Secondary | ICD-10-CM | POA: Diagnosis not present

## 2014-02-11 ENCOUNTER — Ambulatory Visit: Payer: Self-pay | Admitting: Urology

## 2014-02-11 DIAGNOSIS — Z01810 Encounter for preprocedural cardiovascular examination: Secondary | ICD-10-CM | POA: Diagnosis not present

## 2014-02-11 DIAGNOSIS — N529 Male erectile dysfunction, unspecified: Secondary | ICD-10-CM | POA: Diagnosis not present

## 2014-02-11 DIAGNOSIS — Z01812 Encounter for preprocedural laboratory examination: Secondary | ICD-10-CM | POA: Diagnosis not present

## 2014-02-11 DIAGNOSIS — N486 Induration penis plastica: Secondary | ICD-10-CM | POA: Diagnosis not present

## 2014-02-11 DIAGNOSIS — I10 Essential (primary) hypertension: Secondary | ICD-10-CM | POA: Diagnosis not present

## 2014-02-11 LAB — CBC WITH DIFFERENTIAL/PLATELET
BASOS PCT: 0.7 %
Basophil #: 0 10*3/uL (ref 0.0–0.1)
EOS ABS: 0.2 10*3/uL (ref 0.0–0.7)
EOS PCT: 3.9 %
HCT: 37.5 % — ABNORMAL LOW (ref 40.0–52.0)
HGB: 12.7 g/dL — AB (ref 13.0–18.0)
LYMPHS PCT: 17.3 %
Lymphocyte #: 0.8 10*3/uL — ABNORMAL LOW (ref 1.0–3.6)
MCH: 34.2 pg — ABNORMAL HIGH (ref 26.0–34.0)
MCHC: 33.9 g/dL (ref 32.0–36.0)
MCV: 101 fL — ABNORMAL HIGH (ref 80–100)
Monocyte #: 0.4 x10 3/mm (ref 0.2–1.0)
Monocyte %: 8.6 %
NEUTROS PCT: 69.5 %
Neutrophil #: 3.2 10*3/uL (ref 1.4–6.5)
Platelet: 158 10*3/uL (ref 150–440)
RBC: 3.72 10*6/uL — AB (ref 4.40–5.90)
RDW: 13.5 % (ref 11.5–14.5)
WBC: 4.6 10*3/uL (ref 3.8–10.6)

## 2014-02-11 LAB — POTASSIUM: POTASSIUM: 4.1 mmol/L (ref 3.5–5.1)

## 2014-02-18 ENCOUNTER — Ambulatory Visit: Payer: Self-pay | Admitting: Urology

## 2014-02-18 DIAGNOSIS — Z79899 Other long term (current) drug therapy: Secondary | ICD-10-CM | POA: Diagnosis not present

## 2014-02-18 DIAGNOSIS — F319 Bipolar disorder, unspecified: Secondary | ICD-10-CM | POA: Diagnosis not present

## 2014-02-18 DIAGNOSIS — E291 Testicular hypofunction: Secondary | ICD-10-CM | POA: Diagnosis not present

## 2014-02-18 DIAGNOSIS — N400 Enlarged prostate without lower urinary tract symptoms: Secondary | ICD-10-CM | POA: Diagnosis not present

## 2014-02-18 DIAGNOSIS — N529 Male erectile dysfunction, unspecified: Secondary | ICD-10-CM | POA: Diagnosis not present

## 2014-02-18 DIAGNOSIS — N486 Induration penis plastica: Secondary | ICD-10-CM | POA: Diagnosis not present

## 2014-02-18 DIAGNOSIS — I10 Essential (primary) hypertension: Secondary | ICD-10-CM | POA: Diagnosis not present

## 2014-02-18 DIAGNOSIS — Z87891 Personal history of nicotine dependence: Secondary | ICD-10-CM | POA: Diagnosis not present

## 2014-02-19 DIAGNOSIS — N400 Enlarged prostate without lower urinary tract symptoms: Secondary | ICD-10-CM | POA: Diagnosis not present

## 2014-02-19 DIAGNOSIS — E291 Testicular hypofunction: Secondary | ICD-10-CM | POA: Diagnosis not present

## 2014-02-19 DIAGNOSIS — N529 Male erectile dysfunction, unspecified: Secondary | ICD-10-CM | POA: Diagnosis not present

## 2014-02-19 DIAGNOSIS — I10 Essential (primary) hypertension: Secondary | ICD-10-CM | POA: Diagnosis not present

## 2014-02-19 DIAGNOSIS — F319 Bipolar disorder, unspecified: Secondary | ICD-10-CM | POA: Diagnosis not present

## 2014-02-19 DIAGNOSIS — N486 Induration penis plastica: Secondary | ICD-10-CM | POA: Diagnosis not present

## 2014-02-25 DIAGNOSIS — E232 Diabetes insipidus: Secondary | ICD-10-CM | POA: Diagnosis not present

## 2014-02-25 DIAGNOSIS — N2581 Secondary hyperparathyroidism of renal origin: Secondary | ICD-10-CM | POA: Diagnosis not present

## 2014-02-25 DIAGNOSIS — I10 Essential (primary) hypertension: Secondary | ICD-10-CM | POA: Diagnosis not present

## 2014-02-25 DIAGNOSIS — N183 Chronic kidney disease, stage 3 (moderate): Secondary | ICD-10-CM | POA: Diagnosis not present

## 2014-02-26 DIAGNOSIS — F339 Major depressive disorder, recurrent, unspecified: Secondary | ICD-10-CM | POA: Diagnosis not present

## 2014-02-27 DIAGNOSIS — Z23 Encounter for immunization: Secondary | ICD-10-CM | POA: Diagnosis not present

## 2014-02-27 DIAGNOSIS — N183 Chronic kidney disease, stage 3 (moderate): Secondary | ICD-10-CM | POA: Diagnosis not present

## 2014-02-27 DIAGNOSIS — I10 Essential (primary) hypertension: Secondary | ICD-10-CM | POA: Diagnosis not present

## 2014-02-27 DIAGNOSIS — N2581 Secondary hyperparathyroidism of renal origin: Secondary | ICD-10-CM | POA: Diagnosis not present

## 2014-02-27 DIAGNOSIS — F319 Bipolar disorder, unspecified: Secondary | ICD-10-CM | POA: Diagnosis not present

## 2014-02-27 DIAGNOSIS — E232 Diabetes insipidus: Secondary | ICD-10-CM | POA: Diagnosis not present

## 2014-03-01 DIAGNOSIS — L821 Other seborrheic keratosis: Secondary | ICD-10-CM | POA: Diagnosis not present

## 2014-03-01 DIAGNOSIS — Z85828 Personal history of other malignant neoplasm of skin: Secondary | ICD-10-CM | POA: Diagnosis not present

## 2014-03-01 DIAGNOSIS — L570 Actinic keratosis: Secondary | ICD-10-CM | POA: Diagnosis not present

## 2014-03-01 DIAGNOSIS — X32XXXA Exposure to sunlight, initial encounter: Secondary | ICD-10-CM | POA: Diagnosis not present

## 2014-03-01 DIAGNOSIS — D2261 Melanocytic nevi of right upper limb, including shoulder: Secondary | ICD-10-CM | POA: Diagnosis not present

## 2014-03-01 DIAGNOSIS — Z85820 Personal history of malignant melanoma of skin: Secondary | ICD-10-CM | POA: Diagnosis not present

## 2014-03-08 ENCOUNTER — Ambulatory Visit: Payer: Self-pay | Admitting: Internal Medicine

## 2014-03-08 DIAGNOSIS — D72819 Decreased white blood cell count, unspecified: Secondary | ICD-10-CM | POA: Diagnosis not present

## 2014-03-08 DIAGNOSIS — D649 Anemia, unspecified: Secondary | ICD-10-CM | POA: Diagnosis not present

## 2014-03-08 DIAGNOSIS — D472 Monoclonal gammopathy: Secondary | ICD-10-CM | POA: Diagnosis not present

## 2014-03-08 LAB — CALCIUM: Calcium, Total: 9.4 mg/dL (ref 8.5–10.1)

## 2014-03-08 LAB — CREATININE, SERUM
CREATININE: 2.22 mg/dL — AB (ref 0.60–1.30)
GFR CALC AF AMER: 38 — AB
GFR CALC NON AF AMER: 31 — AB

## 2014-03-08 LAB — CBC CANCER CENTER
BASOS PCT: 0.4 %
Basophil #: 0 x10 3/mm (ref 0.0–0.1)
EOS ABS: 0.3 x10 3/mm (ref 0.0–0.7)
EOS PCT: 5.3 %
HCT: 35.1 % — AB (ref 40.0–52.0)
HGB: 11.7 g/dL — AB (ref 13.0–18.0)
Lymphocyte #: 0.6 x10 3/mm — ABNORMAL LOW (ref 1.0–3.6)
Lymphocyte %: 11.4 %
MCH: 33.2 pg (ref 26.0–34.0)
MCHC: 33.4 g/dL (ref 32.0–36.0)
MCV: 99 fL (ref 80–100)
MONOS PCT: 7.3 %
Monocyte #: 0.4 x10 3/mm (ref 0.2–1.0)
NEUTROS PCT: 75.6 %
Neutrophil #: 4 x10 3/mm (ref 1.4–6.5)
Platelet: 248 x10 3/mm (ref 150–440)
RBC: 3.53 10*6/uL — ABNORMAL LOW (ref 4.40–5.90)
RDW: 12.9 % (ref 11.5–14.5)
WBC: 5.2 x10 3/mm (ref 3.8–10.6)

## 2014-03-12 DIAGNOSIS — Z09 Encounter for follow-up examination after completed treatment for conditions other than malignant neoplasm: Secondary | ICD-10-CM | POA: Diagnosis not present

## 2014-03-12 DIAGNOSIS — T814XXA Infection following a procedure, initial encounter: Secondary | ICD-10-CM | POA: Diagnosis not present

## 2014-03-12 DIAGNOSIS — N529 Male erectile dysfunction, unspecified: Secondary | ICD-10-CM | POA: Diagnosis not present

## 2014-03-12 LAB — PROT IMMUNOELECTROPHORES(ARMC)

## 2014-03-20 DIAGNOSIS — Z23 Encounter for immunization: Secondary | ICD-10-CM | POA: Diagnosis not present

## 2014-03-20 DIAGNOSIS — N183 Chronic kidney disease, stage 3 (moderate): Secondary | ICD-10-CM | POA: Diagnosis not present

## 2014-03-20 DIAGNOSIS — E785 Hyperlipidemia, unspecified: Secondary | ICD-10-CM | POA: Diagnosis not present

## 2014-03-20 DIAGNOSIS — I10 Essential (primary) hypertension: Secondary | ICD-10-CM | POA: Diagnosis not present

## 2014-03-22 ENCOUNTER — Ambulatory Visit: Payer: Self-pay | Admitting: Internal Medicine

## 2014-03-22 DIAGNOSIS — Z87891 Personal history of nicotine dependence: Secondary | ICD-10-CM | POA: Diagnosis not present

## 2014-03-22 DIAGNOSIS — D72819 Decreased white blood cell count, unspecified: Secondary | ICD-10-CM | POA: Diagnosis not present

## 2014-03-22 DIAGNOSIS — N189 Chronic kidney disease, unspecified: Secondary | ICD-10-CM | POA: Diagnosis not present

## 2014-03-22 DIAGNOSIS — E785 Hyperlipidemia, unspecified: Secondary | ICD-10-CM | POA: Diagnosis not present

## 2014-03-22 DIAGNOSIS — D472 Monoclonal gammopathy: Secondary | ICD-10-CM | POA: Diagnosis not present

## 2014-03-22 DIAGNOSIS — Z79899 Other long term (current) drug therapy: Secondary | ICD-10-CM | POA: Diagnosis not present

## 2014-03-22 DIAGNOSIS — D649 Anemia, unspecified: Secondary | ICD-10-CM | POA: Diagnosis not present

## 2014-03-25 DIAGNOSIS — N189 Chronic kidney disease, unspecified: Secondary | ICD-10-CM | POA: Diagnosis not present

## 2014-03-25 DIAGNOSIS — E785 Hyperlipidemia, unspecified: Secondary | ICD-10-CM | POA: Diagnosis not present

## 2014-03-25 DIAGNOSIS — Z79899 Other long term (current) drug therapy: Secondary | ICD-10-CM | POA: Diagnosis not present

## 2014-03-25 DIAGNOSIS — D472 Monoclonal gammopathy: Secondary | ICD-10-CM | POA: Diagnosis not present

## 2014-03-25 DIAGNOSIS — D72819 Decreased white blood cell count, unspecified: Secondary | ICD-10-CM | POA: Diagnosis not present

## 2014-03-25 DIAGNOSIS — D649 Anemia, unspecified: Secondary | ICD-10-CM | POA: Diagnosis not present

## 2014-04-05 DIAGNOSIS — N529 Male erectile dysfunction, unspecified: Secondary | ICD-10-CM | POA: Diagnosis not present

## 2014-04-05 DIAGNOSIS — Z09 Encounter for follow-up examination after completed treatment for conditions other than malignant neoplasm: Secondary | ICD-10-CM | POA: Diagnosis not present

## 2014-04-05 DIAGNOSIS — T814XXA Infection following a procedure, initial encounter: Secondary | ICD-10-CM | POA: Diagnosis not present

## 2014-04-22 ENCOUNTER — Ambulatory Visit: Payer: Self-pay | Admitting: Family Medicine

## 2014-04-22 ENCOUNTER — Ambulatory Visit: Payer: Self-pay | Admitting: Internal Medicine

## 2014-05-28 DIAGNOSIS — E669 Obesity, unspecified: Secondary | ICD-10-CM | POA: Diagnosis not present

## 2014-05-28 DIAGNOSIS — R972 Elevated prostate specific antigen [PSA]: Secondary | ICD-10-CM | POA: Diagnosis not present

## 2014-05-28 DIAGNOSIS — N401 Enlarged prostate with lower urinary tract symptoms: Secondary | ICD-10-CM | POA: Diagnosis not present

## 2014-05-28 DIAGNOSIS — I10 Essential (primary) hypertension: Secondary | ICD-10-CM | POA: Diagnosis not present

## 2014-06-24 DIAGNOSIS — N2581 Secondary hyperparathyroidism of renal origin: Secondary | ICD-10-CM | POA: Diagnosis not present

## 2014-06-24 DIAGNOSIS — N183 Chronic kidney disease, stage 3 (moderate): Secondary | ICD-10-CM | POA: Diagnosis not present

## 2014-06-24 DIAGNOSIS — I10 Essential (primary) hypertension: Secondary | ICD-10-CM | POA: Diagnosis not present

## 2014-06-24 DIAGNOSIS — E232 Diabetes insipidus: Secondary | ICD-10-CM | POA: Diagnosis not present

## 2014-07-09 DIAGNOSIS — J019 Acute sinusitis, unspecified: Secondary | ICD-10-CM | POA: Diagnosis not present

## 2014-07-09 DIAGNOSIS — N183 Chronic kidney disease, stage 3 (moderate): Secondary | ICD-10-CM | POA: Diagnosis not present

## 2014-07-13 NOTE — Discharge Summary (Signed)
PATIENT NAME:  Luis Miller, Luis Miller MR#:  [id number] DATE OF BIRTH:  1945/08/29  DATE OF ADMISSION:  02/18/2014 DATE OF DISCHARGE:  02/19/2014  Luis Miller was admitted to observation.   POSTOPERATIVE DIAGNOSIS: Hypogonadism with impotence.   PROCEDURE: On February 18, 2014 insertion of an inflatable penile prosthesis, infrapubic approach.  COMPLICATIONS: None.   HOSPITAL COURSE: Drains are removed on the first postoperative day with minimal ecchymosis and swelling. The patient is active, voiding, vital signs are stable. Abdomen is soft.   Instructions are given to shower, limited activities, 2 quarts of water a day to drink and see me in about a week and a half. The patient will be followed up a week from Monday, on December 7th or 8th.   Follow-up instructions are to pull his pump down every time he voids. He was shown the pump in the scrotum. It is in good position, easy to feel, with minimal swelling. The patient's drain was removed with no difficulty. There was minimal drainage. He received his normal home meds while in the hospital, as well as vancomycin and gentamicin postop for 12 hours, 2 doses of vancomycin given, 3 of gentamicin. They were given at 6 hour intervals on the gentamicin and 12 hour intervals for his vancomycin 500. He required minimal pain meds.   DISCHARGE CONDITION: Satisfactory.   ____________________________ Janice Coffin. Elnoria Howard, DO rdh:sb D: 02/19/2014 07:21:01 ET T: 02/19/2014 08:06:30 ET JOB#: [id number]  cc: Janice Coffin. Elnoria Howard, DO, <Dictator> RICHARD D HART DO ELECTRONICALLY SIGNED 03/06/2014 13:11

## 2014-07-13 NOTE — Op Note (Signed)
PATIENT NAME:  Luis Miller, Luis Miller MR#:  [id number] DATE OF BIRTH:  [birth year]  DATE OF PROCEDURE:  02/18/2014  PREOPERATIVE DIAGNOSIS: Organic impotence and hypogonadism.   POSTOPERATIVE DIAGNOSIS: Organic impotence and hypogonadism.  PROCEDURE: Insertion of an inflatable penile prosthesis Surveyor, minerals).   BLOOD LOSS: Zero.   COMPLICATIONS: Zero.   PROCEDURE:  Prostheses:  We put in a [employer] reservoir with 125 mL reservoir with 75 mL within the reservoir.  This was transversalis placement through the internal ring.  We had two 16 cm Titan cylinders with pump.  Our rear tip extenders were 2.5 and 1.5.  The 2.5 was on the left and the 1.5 on the right.   Total length of the corpora were, on the right distal 9, proximal 8 cm, and the same on the left.  This information was also sent to [employer] for permanent storage in case of any need to exchange this prosthesis.   DESCRIPTION OF PROCEDURE: With the patient sterilely prepped and draped in the supine position with a slight flexion to the table, to open up the infrapubic area totally and after 20 minute sterile scrub and then prep, the patient is sterilely prepped and draped and an appropriate timeout done.  A small 1.5 inches incision is made in the infrapubic area 1 inch above the penile abdominal angle.  The blunt dissection is carried down to each corpora being careful to not interfere with the dorsal vein plexus and the branch of the epigastric vessels.  The corpora then exposed and on the left side I placed a 0 Ethibond suture and on the right side laterally with 2 sutures.  Then I do the opposite side. Then I lift up on each suture on each side so that the corpora is exposed and with a 12 blade make a corporotomy incision.  I enlarged this incision slightly with electrocautery.  Then with a Furlow dilator and measuring device I measure proximally first and then distally being careful to stay on the lateral side of the corpora.  Preoperatively, we had  filled each corpora with saline. There was minimal bend.  There is a very floppy head, very floppy glans, but this is not due to any lateral Peyronie's.  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 Then the right corpora is incised with a corporotomy incision utilizing a 12 blade and sutures to hold it up.  Measurements are taken and the above measurements of 17 total cm are found and then we use rear tip extenders to the rear tip extenders, one side more than the other, because he does have a tendency for the glans to flop to the right.  There is no SST with this. There is just a floppy glans head which will later have to be modified utilizing Durasphere.     Once I made the measurements, I then insert the reservoir. The cylinders are prepared on the back table.  The reservoir was inserted utilizing a 3.5 inch nasal speculum through the internal ring.  Then I rotate the nasal speculum from 90 degrees to a horizontal position pointing toward the head.  I popped through the transversalis fascia and so the reservoirs put in along the transversalis muscle below the fascia.  The speculum is opened and the reservoir placed through the external ring.  It is in excellent position.  I fill it was 75 mL of saline and there is no back pressure from this.  The space is large enough for it and fits nicely there.  Then I leave the reservoir tubing clamped and insert the cylinders.  The cylinders are inserted to the end of the corpora distally utilizing the [facility] inserter and a Lanny Hurst needle.  I am very careful to stay lateral and not perforate with the needle. The needle was kept well inside the [facility] inserter until I pushed the [place] needle out through the glans on each side.  This is well away from the urethra.  I pulled down on the glans, especially on the left side, we get an excellent placement of the needle; on the right side, I am not as happy with the  placement of the needle, but it is still out through the end of the glans with the corpora intact.  Then, I insert the cylinders and pump each cylinder up.  We modified the length on each side because of the floppiness of the glans.  Then I suture closed the corporotomy sites with the original corporotomy suture.  I have to place 1 more suture on the left side and I am very careful to push the prosthesis down so that the corpora is closed on that side.  Then once this is done, I put the pump into the scrotum using the nasal speculum on the left, and I placed it down into the scrotum dependently below the dartos fascia.  I placed it into the scrotum and then pulled down hard on it, so I feel that the fascia gives so that it is in excellent position in the most dependent portion of the scrotum.  Careful not to skive the scrotum. Then, I put 2 pumps into the cylinders, and leave them slightly inflated.  I will deflate them again in a week. Then I connect the tubing from the pump to the reservoir with a quick connect.  We are very careful not to have any leakage of fluid, or air get into the system.  Then we put a drain next to the pump. This is a Jackson-Pratt drain, bringing it out through the skin lateral to the incision, close the incision with a running 3-0 Vicryl, and the skin with a 4-0 Vicryl in a subcuticular fashion. Suture the drain in utilizing 3-0 Ethilon.  So, the patient tolerated this well, and the Dermabond is applied to the incision and sterile dressings and a 10 pound sandbag weight over the incision to keep the edema down, sent to recovery in satisfactory condition.      ____________________________ Janice Coffin. Elnoria Howard, [place] rdh:DT D: 02/18/2014 10:04:11 ET T: 02/18/2014 11:13:50 ET JOB#: [id number]  cc: Janice Coffin. Elnoria Howard, DO, <Dictator> RICHARD D HART DO ELECTRONICALLY SIGNED 03/06/2014 13:11

## 2014-08-12 DIAGNOSIS — J069 Acute upper respiratory infection, unspecified: Secondary | ICD-10-CM | POA: Diagnosis not present

## 2014-08-22 ENCOUNTER — Encounter: Payer: Self-pay | Admitting: Family Medicine

## 2014-08-27 DIAGNOSIS — F349 Persistent mood [affective] disorder, unspecified: Secondary | ICD-10-CM | POA: Diagnosis not present

## 2014-09-09 ENCOUNTER — Encounter: Payer: Self-pay | Admitting: Family Medicine

## 2014-09-09 ENCOUNTER — Ambulatory Visit (INDEPENDENT_AMBULATORY_CARE_PROVIDER_SITE_OTHER): Payer: Medicare Other | Admitting: Family Medicine

## 2014-09-09 ENCOUNTER — Other Ambulatory Visit: Payer: Self-pay | Admitting: Family Medicine

## 2014-09-09 VITALS — BP 164/72 | HR 48 | Temp 97.7°F | Ht 66.2 in | Wt 195.0 lb

## 2014-09-09 DIAGNOSIS — R972 Elevated prostate specific antigen [PSA]: Secondary | ICD-10-CM

## 2014-09-09 DIAGNOSIS — I150 Renovascular hypertension: Secondary | ICD-10-CM

## 2014-09-09 DIAGNOSIS — Z79899 Other long term (current) drug therapy: Secondary | ICD-10-CM

## 2014-09-09 DIAGNOSIS — F3176 Bipolar disorder, in full remission, most recent episode depressed: Secondary | ICD-10-CM

## 2014-09-09 DIAGNOSIS — N529 Male erectile dysfunction, unspecified: Secondary | ICD-10-CM | POA: Insufficient documentation

## 2014-09-09 DIAGNOSIS — Z0001 Encounter for general adult medical examination with abnormal findings: Secondary | ICD-10-CM | POA: Diagnosis not present

## 2014-09-09 DIAGNOSIS — I10 Essential (primary) hypertension: Secondary | ICD-10-CM | POA: Insufficient documentation

## 2014-09-09 LAB — URINALYSIS, ROUTINE W REFLEX MICROSCOPIC
BILIRUBIN UA: NEGATIVE
GLUCOSE, UA: NEGATIVE
Ketones, UA: NEGATIVE
LEUKOCYTES UA: NEGATIVE
Nitrite, UA: NEGATIVE
Protein, UA: NEGATIVE
RBC UA: NEGATIVE
Specific Gravity, UA: 1.01 (ref 1.005–1.030)
UUROB: 0.2 mg/dL (ref 0.2–1.0)
pH, UA: 6 (ref 5.0–7.5)

## 2014-09-09 MED ORDER — AMILORIDE HCL 5 MG PO TABS: 5.0000 mg | ORAL_TABLET | Freq: Every day | ORAL | Status: DC

## 2014-09-09 MED ORDER — AMLODIPINE BESYLATE 10 MG PO TABS: 10.0000 mg | ORAL_TABLET | Freq: Every day | ORAL | Status: DC

## 2014-09-09 NOTE — Assessment & Plan Note (Signed)
Has implant pump

## 2014-09-09 NOTE — Assessment & Plan Note (Signed)
Followed by Luis Miller and doing well

## 2014-09-09 NOTE — Progress Notes (Signed)
   BP 164/72 mmHg  Pulse 48  Temp(Src) 97.7 F (36.5 C)  Ht 5' 6.2" (1.681 m)  Wt 195 lb (88.451 kg)  BMI 31.30 kg/m2  SpO2 99%   Subjective:    Patient ID: Luis Coss., male    DOB: [birth year], 69 y.o.   MRN: [id number]  HPI: Luis Alzate. is a 69 y.o. male  Chief Complaint  Patient presents with  . Annual Exam  doing well Going to [facility] nephrology for control and CKD Nerves stable Dr Geryl Councilman needs some blood work, will send copy Tasking meds with no prob;lems long term No side effects   Relevant past medical, surgical, family and social history reviewed and updated as indicated. Interim medical history since our last visit reviewed. Allergies and medications reviewed and updated.  Review of Systems  Constitutional: Negative.   HENT: Negative.   Eyes: Negative.   Respiratory: Negative.   Cardiovascular: Negative.   Endocrine: Negative.   Musculoskeletal: Negative.   Skin: Negative.   Allergic/Immunologic: Negative.   Neurological: Negative.   Hematological: Negative.   Psychiatric/Behavioral: Negative.     Per HPI unless specifically indicated above     Objective:    BP 164/72 mmHg  Pulse 48  Temp(Src) 97.7 F (36.5 C)  Ht 5' 6.2" (1.681 m)  Wt 195 lb (88.451 kg)  BMI 31.30 kg/m2  SpO2 99%  Wt Readings from Last 3 Encounters:  09/09/14 195 lb (88.451 kg)  07/09/14 199 lb (90.266 kg)    Physical Exam  Constitutional: He is oriented to person, place, and time. He appears well-developed and well-nourished.  HENT:  Head: Normocephalic and atraumatic.  Right Ear: External ear normal.  Left Ear: External ear normal.  Eyes: Conjunctivae and EOM are normal. Pupils are equal, round, and reactive to light.  Neck: Normal range of motion. Neck supple.  Cardiovascular: Normal rate, regular rhythm, normal heart sounds and intact distal pulses.   Pulmonary/Chest: Effort normal and breath sounds normal.  Abdominal: Soft. Bowel sounds are normal.  There is no splenomegaly or hepatomegaly.  Genitourinary: Rectum normal, prostate normal and penis normal.  Has implant pump  Musculoskeletal: Normal range of motion.  Neurological: He is alert and oriented to person, place, and time. He has normal reflexes.  Skin: No rash noted. No erythema.  Psychiatric: He has a normal mood and affect. His behavior is normal. Judgment and thought content normal.        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

## 2014-09-09 NOTE — Assessment & Plan Note (Signed)
BP has been elevated and Kidney Dr is increasing meds

## 2014-09-10 LAB — COMPREHENSIVE METABOLIC PANEL
A/G RATIO: 1.8 (ref 1.1–2.5)
ALT: 15 IU/L (ref 0–44)
AST: 20 IU/L (ref 0–40)
Albumin: 4.3 g/dL (ref 3.6–4.8)
Alkaline Phosphatase: 67 IU/L (ref 39–117)
BILIRUBIN TOTAL: 0.5 mg/dL (ref 0.0–1.2)
BUN / CREAT RATIO: 13 (ref 10–22)
BUN: 27 mg/dL (ref 8–27)
CALCIUM: 9.6 mg/dL (ref 8.6–10.2)
CO2: 22 mmol/L (ref 18–29)
CREATININE: 2.01 mg/dL — AB (ref 0.76–1.27)
Chloride: 109 mmol/L — ABNORMAL HIGH (ref 97–108)
GFR calc non Af Amer: 33 mL/min/{1.73_m2} — ABNORMAL LOW (ref >59)
GFR, EST AFRICAN AMERICAN: 38 mL/min/{1.73_m2} — AB (ref >59)
GLUCOSE: 105 mg/dL — AB (ref 65–99)
Globulin, Total: 2.4 g/dL (ref 1.5–4.5)
POTASSIUM: 4.6 mmol/L (ref 3.5–5.2)
Sodium: 147 mmol/L — ABNORMAL HIGH (ref 134–144)
TOTAL PROTEIN: 6.7 g/dL (ref 6.0–8.5)

## 2014-09-10 LAB — PSA: PROSTATE SPECIFIC AG, SERUM: 5.7 ng/mL — AB (ref 0.0–4.0)

## 2014-09-10 LAB — CBC WITH DIFFERENTIAL/PLATELET
BASOS ABS: 0 10*3/uL (ref 0.0–0.2)
Basos: 0 %
EOS (ABSOLUTE): 0.2 10*3/uL (ref 0.0–0.4)
Eos: 5 %
HEMATOCRIT: 37.4 % — AB (ref 37.5–51.0)
HEMOGLOBIN: 12.6 g/dL (ref 12.6–17.7)
IMMATURE GRANS (ABS): 0 10*3/uL (ref 0.0–0.1)
Immature Granulocytes: 0 %
LYMPHS: 26 %
Lymphocytes Absolute: 0.9 10*3/uL (ref 0.7–3.1)
MCH: 33 pg (ref 26.6–33.0)
MCHC: 33.7 g/dL (ref 31.5–35.7)
MCV: 98 fL — ABNORMAL HIGH (ref 79–97)
MONOCYTES: 6 %
Monocytes Absolute: 0.2 10*3/uL (ref 0.1–0.9)
NEUTROS ABS: 2.2 10*3/uL (ref 1.4–7.0)
Neutrophils: 63 %
Platelets: 138 10*3/uL — ABNORMAL LOW (ref 150–379)
RBC: 3.82 x10E6/uL — ABNORMAL LOW (ref 4.14–5.80)
RDW: 14.6 % (ref 12.3–15.4)
WBC: 3.5 10*3/uL (ref 3.4–10.8)

## 2014-09-10 LAB — LIPID PANEL
CHOLESTEROL TOTAL: 184 mg/dL (ref 100–199)
Chol/HDL Ratio: 4.8 ratio units (ref 0.0–5.0)
HDL: 38 mg/dL — ABNORMAL LOW (ref >39)
LDL Calculated: 104 mg/dL — ABNORMAL HIGH (ref 0–99)
Triglycerides: 211 mg/dL — ABNORMAL HIGH (ref 0–149)
VLDL Cholesterol Cal: 42 mg/dL — ABNORMAL HIGH (ref 5–40)

## 2014-09-10 LAB — LITHIUM LEVEL: LITHIUM LVL: 1.1 mmol/L (ref 0.6–1.4)

## 2014-09-10 LAB — TSH: TSH: 0.316 u[IU]/mL — AB (ref 0.450–4.500)

## 2014-09-12 DIAGNOSIS — D485 Neoplasm of uncertain behavior of skin: Secondary | ICD-10-CM | POA: Diagnosis not present

## 2014-09-12 DIAGNOSIS — L570 Actinic keratosis: Secondary | ICD-10-CM | POA: Diagnosis not present

## 2014-09-12 DIAGNOSIS — X32XXXA Exposure to sunlight, initial encounter: Secondary | ICD-10-CM | POA: Diagnosis not present

## 2014-09-12 DIAGNOSIS — Z85820 Personal history of malignant melanoma of skin: Secondary | ICD-10-CM | POA: Diagnosis not present

## 2014-09-12 DIAGNOSIS — Z85828 Personal history of other malignant neoplasm of skin: Secondary | ICD-10-CM | POA: Diagnosis not present

## 2014-09-12 DIAGNOSIS — L538 Other specified erythematous conditions: Secondary | ICD-10-CM | POA: Diagnosis not present

## 2014-09-12 DIAGNOSIS — D2261 Melanocytic nevi of right upper limb, including shoulder: Secondary | ICD-10-CM | POA: Diagnosis not present

## 2014-09-12 DIAGNOSIS — L820 Inflamed seborrheic keratosis: Secondary | ICD-10-CM | POA: Diagnosis not present

## 2014-09-12 DIAGNOSIS — R208 Other disturbances of skin sensation: Secondary | ICD-10-CM | POA: Diagnosis not present

## 2014-09-12 DIAGNOSIS — D225 Melanocytic nevi of trunk: Secondary | ICD-10-CM | POA: Diagnosis not present

## 2014-09-18 ENCOUNTER — Telehealth: Payer: Self-pay | Admitting: Family Medicine

## 2014-09-18 NOTE — Telephone Encounter (Signed)
Pt called about extra labs that were done when he was here, just basically wanted results. And wanted to also make sure the results were sent to the other doctor.

## 2014-09-18 NOTE — Telephone Encounter (Signed)
Labs faxed to [place], patient notified

## 2014-09-25 ENCOUNTER — Ambulatory Visit (INDEPENDENT_AMBULATORY_CARE_PROVIDER_SITE_OTHER): Payer: Medicare Other | Admitting: Urology

## 2014-09-25 ENCOUNTER — Encounter: Payer: Self-pay | Admitting: Urology

## 2014-09-25 VITALS — BP 132/69 | HR 53 | Ht 68.0 in | Wt 197.8 lb

## 2014-09-25 DIAGNOSIS — N529 Male erectile dysfunction, unspecified: Secondary | ICD-10-CM | POA: Diagnosis not present

## 2014-09-25 NOTE — Progress Notes (Signed)
09/25/2014 10:26 AM   Luis Miller 06-Dec-1945 [id number]  Referring provider: Guadalupe Maple, MD [address] [place], [place] [address]  Chief Complaint  Patient presents with  . Follow-up    pt comes in today complaining that his penile prosthesis is not working     HPI: Patient is approximately 6 or 7 months post penile prosthesis. This replacement of the penile prosthesis was for impotence. He had an inflatable prosthesis placed in infrapubic incision. Patient is having difficulty getting the cylinders hard. He's tried to use it one time since the surgery. He only prompted twice. I showed him he needed to pump it 728 times. The result of this is a very hard good erection. The pump is in perfect position. The prosthesis itself was in good position. He has slight floppiness in the head of his penis but when you rotate the head of the penis over the prosthesis is in perfect position in the corpora.    PMH: Past Medical History  Diagnosis Date  . Diabetes insipidus   . Nonspecific ulcerative colitis   . Bipolar affective   . Cancer malignant melanoma  . Internal hemorrhoids   . Hyperlipidemia   . Hypertension   . Chronic kidney disease   . Apnea, sleep   . Impotence   . Secondary hyperparathyroidism of renal origin   . Erectile dysfunction     Surgical History: Past Surgical History  Procedure Laterality Date  . Vasectomy    . Tonsillectomy    . Bone marrow biopsy    . Penile prosthesis implant      Home Medications:    Medication List       This list is accurate as of: 09/25/14 10:26 AM.  Always use your most recent med list.               aMILoride 5 MG tablet  Commonly known as:  MIDAMOR  Take 1 tablet (5 mg total) by mouth daily.     amLODipine 10 MG tablet  Commonly known as:  NORVASC  Take 1 tablet (10 mg total) by mouth daily.     azelastine 0.1 % nasal spray  Commonly known as:  ASTELIN  Place 2 sprays into both nostrils as needed for  rhinitis. Use in each nostril as directed     benazepril 40 MG tablet  Commonly known as:  LOTENSIN     benzonatate 100 MG capsule  Commonly known as:  TESSALON     carvedilol 6.25 MG tablet  Commonly known as:  COREG  Take 6.25 mg by mouth daily.     hydrALAZINE 100 MG tablet  Commonly known as:  APRESOLINE  Take 100 mg by mouth 3 (three) times daily.     lithium carbonate 450 MG CR tablet  Commonly known as:  ESKALITH     multivitamin tablet  Take 1 tablet by mouth daily.     QUEtiapine 200 MG tablet  Commonly known as:  SEROQUEL  Take 200 mg by mouth at bedtime.     VENTOLIN HFA 108 (90 BASE) MCG/ACT inhaler  Generic drug:  albuterol     VITAMIN D (CHOLECALCIFEROL) PO  Take by mouth.        Allergies:  Allergies  Allergen Reactions  . Mirtazapine Rash  . Olanzapine Rash  . Strattera [Atomoxetine Hcl]     Urinary sx    Family History: Family History  Problem Relation Age of Onset  . Family  history unknown: Yes    Social History:  reports that he quit smoking about 40 years ago. He has never used smokeless tobacco. He reports that he does not drink alcohol or use illicit drugs.  ROS: UROLOGY Frequent Urination?: No Hard to postpone urination?: No Burning/pain with urination?: No Get up at night to urinate?: Yes Leakage of urine?: No Urine stream starts and stops?: No Trouble starting stream?: No Do you have to strain to urinate?: No Blood in urine?: No Urinary tract infection?: No Sexually transmitted disease?: No Injury to kidneys or bladder?: No Painful intercourse?: No Weak stream?: No Erection problems?: Yes Penile pain?: No Gastrointestinal Nausea?: No Vomiting?: No Indigestion/heartburn?: No Diarrhea?: No Constipation?: No Constitutional Fever: No Night sweats?: No Weight loss?: No Fatigue?: No Skin Skin rash/lesions?: No Itching?: No Eyes Blurred vision?: No Double vision?: No Ears/Nose/Throat Sore throat?: No Sinus  problems?: No Hematologic/Lymphatic Swollen glands?: No Easy bruising?: No Cardiovascular Leg swelling?: No Chest pain?: No Respiratory Cough?: No Shortness of breath?: No Endocrine Excessive thirst?: No Musculoskeletal Back pain?: No Joint pain?: No Neurological Headaches?: No Dizziness?: No Psychologic Depression?: No Anxiety?: No   Physical Exam: BP 132/69 mmHg  Pulse 53  Ht 5' 8"  (1.727 m)  Wt 197 lb 12.8 oz (89.721 kg)  BMI 30.08 kg/m2  Constitutional:  Alert and oriented, No acute distress. HEENT: [place] AT, moist mucus membranes.  Trachea midline, no masses. Cardiovascular: No clubbing, cyanosis, or edema. Respiratory: Normal respiratory effort, no increased work of breathing. GI: Abdomen is soft, nontender, nondistended, no abdominal masses GU: No CVA tenderness. Well-functioning penile prosthesis in a noncircumcised patient Skin: No rashes, bruises or suspicious lesions. Lymph: No cervical or inguinal adenopathy. Neurologic: Grossly intact, no focal deficits, moving all 4 extremities. Psychiatric: Normal mood and affect.  Laboratory Data: Lab Results  Component Value Date   WBC 3.5 09/09/2014   HGB 11.7* 03/08/2014   HCT 37.4* 09/09/2014   MCV 99 03/08/2014   PLT 248 03/08/2014    Lab Results  Component Value Date   CREATININE 2.01* 09/09/2014    Lab Results  Component Value Date   PSA 5.7* 09/09/2014    No results found for: TESTOSTERONE  No results found for: HGBA1C  Urinalysis    Component Value Date/Time   GLUCOSEU Negative 09/09/2014 1053   BILIRUBINUR Negative 09/09/2014 1053   NITRITE Negative 09/09/2014 1053   LEUKOCYTESUR Negative 09/09/2014 1053    Pertinent Imaging: None  Assessment & Plan:  Functional inflatable penile prosthesis. Follow-up is when necessary for dysfunction.  There are no diagnoses linked to this encounter.  No Follow-up on file.  Collier Flowers, [facility] [address] [place],  [address] [phone]

## 2014-10-02 DIAGNOSIS — D485 Neoplasm of uncertain behavior of skin: Secondary | ICD-10-CM | POA: Diagnosis not present

## 2014-10-02 DIAGNOSIS — D225 Melanocytic nevi of trunk: Secondary | ICD-10-CM | POA: Diagnosis not present

## 2014-10-02 DIAGNOSIS — L905 Scar conditions and fibrosis of skin: Secondary | ICD-10-CM | POA: Diagnosis not present

## 2014-10-08 ENCOUNTER — Telehealth: Payer: Self-pay | Admitting: Family Medicine

## 2014-10-08 DIAGNOSIS — Z1211 Encounter for screening for malignant neoplasm of colon: Secondary | ICD-10-CM

## 2014-10-08 NOTE — Telephone Encounter (Signed)
PT CAME IN AND SAID HE NEEDED A REFERRAL TO GET HIS COLONOSCOPY DONE. HE GOES TO [facility](DR Quinterious T ELLIOTT)  AND THEY TOLD HIM THAT HE WOULD NEED PRIOR APPROVAL FROM HIS PCP. THE OFFICE NUMBER IS [phone] AND THE PTS NUMBER IS [phone]

## 2014-10-08 NOTE — Telephone Encounter (Signed)
Could you put in a referral for a colonoscopy with Dr. Vira Agar Last one 11/23/11 with repeat 3 years

## 2014-10-10 DIAGNOSIS — R946 Abnormal results of thyroid function studies: Secondary | ICD-10-CM | POA: Diagnosis not present

## 2014-10-17 DIAGNOSIS — R946 Abnormal results of thyroid function studies: Secondary | ICD-10-CM | POA: Diagnosis not present

## 2014-10-17 DIAGNOSIS — Z79899 Other long term (current) drug therapy: Secondary | ICD-10-CM | POA: Diagnosis not present

## 2014-10-21 DIAGNOSIS — I10 Essential (primary) hypertension: Secondary | ICD-10-CM | POA: Diagnosis not present

## 2014-10-21 DIAGNOSIS — N183 Chronic kidney disease, stage 3 (moderate): Secondary | ICD-10-CM | POA: Diagnosis not present

## 2014-10-21 DIAGNOSIS — N2581 Secondary hyperparathyroidism of renal origin: Secondary | ICD-10-CM | POA: Diagnosis not present

## 2014-10-21 DIAGNOSIS — E232 Diabetes insipidus: Secondary | ICD-10-CM | POA: Diagnosis not present

## 2014-11-14 ENCOUNTER — Ambulatory Visit (INDEPENDENT_AMBULATORY_CARE_PROVIDER_SITE_OTHER): Payer: Medicare Other | Admitting: Family Medicine

## 2014-11-14 ENCOUNTER — Encounter: Payer: Self-pay | Admitting: Family Medicine

## 2014-11-14 VITALS — BP 136/72 | HR 61 | Temp 98.6°F | Ht 66.2 in | Wt 199.0 lb

## 2014-11-14 DIAGNOSIS — M19079 Primary osteoarthritis, unspecified ankle and foot: Secondary | ICD-10-CM

## 2014-11-14 DIAGNOSIS — I10 Essential (primary) hypertension: Secondary | ICD-10-CM | POA: Diagnosis not present

## 2014-11-14 DIAGNOSIS — M129 Arthropathy, unspecified: Secondary | ICD-10-CM

## 2014-11-14 NOTE — Assessment & Plan Note (Signed)
The current medical regimen is effective;  continue present plan and medications.  

## 2014-11-14 NOTE — Progress Notes (Signed)
BP 136/72 mmHg  Pulse 61  Temp(Src) 98.6 F (37 C)  Ht 5' 6.2" (1.681 m)  Wt 199 lb (90.266 kg)  BMI 31.94 kg/m2  SpO2 98%   Subjective:    Patient ID: Luis Coss., male    DOB: [birth year], 69 y.o.   MRN: [id number]  HPI: Luis Miller. is a 69 y.o. male  Chief Complaint  Patient presents with  . Ankle Pain    right, with swelling   patient with right ankle swellings been ongoing for 2-3 weeks has had chronic mild leg swelling of both legs ongoing long-term the patient's taking amlodipine 10 mg no change in that. Patient with no shortness of breath PND orthopnea No known trauma to either ankle no extra walking discomfort. Swelling is largely gone in the morning but walking is very tender especially on his right ankle these last few weeks.   Relevant past medical, surgical, family and social history reviewed and updated as indicated. Interim medical history since our last visit reviewed. Allergies and medications reviewed and updated.  Review of Systems  Constitutional: Negative.   Respiratory: Negative.   Cardiovascular: Negative.   Skin:       2 weeks ago patient had some peeling of his skin like sunburn but no sun exposure is otherwise been okay no itching or other changes. No change in medications.    Per HPI unless specifically indicated above     Objective:    BP 136/72 mmHg  Pulse 61  Temp(Src) 98.6 F (37 C)  Ht 5' 6.2" (1.681 m)  Wt 199 lb (90.266 kg)  BMI 31.94 kg/m2  SpO2 98%  Wt Readings from Last 3 Encounters:  11/14/14 199 lb (90.266 kg)  09/25/14 197 lb 12.8 oz (89.721 kg)  09/09/14 195 lb (88.451 kg)    Physical Exam  Constitutional: He is oriented to person, place, and time. He appears well-developed and well-nourished. No distress.  HENT:  Head: Normocephalic and atraumatic.  Right Ear: Hearing normal.  Left Ear: Hearing normal.  Nose: Nose normal.  Eyes: Conjunctivae and lids are normal. Right eye exhibits no discharge. Left  eye exhibits no discharge. No scleral icterus.  Pulmonary/Chest: Effort normal. No respiratory distress.  Musculoskeletal: Normal range of motion.  Ankles right one slightly swollen full range of motion no joint tenderness. Trace edema. Normal strength and and neurological exam, good pulses  Neurological: He is alert and oriented to person, place, and time.  Skin: Skin is intact. No rash noted.  Psychiatric: He has a normal mood and affect. His speech is normal and behavior is normal. Judgment and thought content normal. Cognition and memory are normal.    Results for orders placed or performed in visit on 09/09/14  CBC with Differential/Platelet  Result Value Ref Range   WBC 3.5 3.4 - 10.8 x10E3/uL   RBC 3.82 (L) 4.14 - 5.80 x10E6/uL   Hemoglobin 12.6 12.6 - 17.7 g/dL   Hematocrit 37.4 (L) 37.5 - 51.0 %   MCV 98 (H) 79 - 97 fL   MCH 33.0 26.6 - 33.0 pg   MCHC 33.7 31.5 - 35.7 g/dL   RDW 14.6 12.3 - 15.4 %   Platelets 138 (L) 150 - 379 x10E3/uL   Neutrophils 63 %   Lymphs 26 %   Monocytes 6 %   Eos 5 %   Basos 0 %   Neutrophils Absolute 2.2 1.4 - 7.0 x10E3/uL   Lymphocytes Absolute 0.9 0.7 - 3.1  x10E3/uL   Monocytes Absolute 0.2 0.1 - 0.9 x10E3/uL   EOS (ABSOLUTE) 0.2 0.0 - 0.4 x10E3/uL   Basophils Absolute 0.0 0.0 - 0.2 x10E3/uL   Immature Granulocytes 0 %   Immature Grans (Abs) 0.0 0.0 - 0.1 x10E3/uL  Comprehensive metabolic panel  Result Value Ref Range   Glucose 105 (H) 65 - 99 mg/dL   BUN 27 8 - 27 mg/dL   Creatinine, Ser 2.01 (H) 0.76 - 1.27 mg/dL   GFR calc non Af Amer 33 (L) >59 mL/min/1.73   GFR calc Af Amer 38 (L) >59 mL/min/1.73   BUN/Creatinine Ratio 13 10 - 22   Sodium 147 (H) 134 - 144 mmol/L   Potassium 4.6 3.5 - 5.2 mmol/L   Chloride 109 (H) 97 - 108 mmol/L   CO2 22 18 - 29 mmol/L   Calcium 9.6 8.6 - 10.2 mg/dL   Total Protein 6.7 6.0 - 8.5 g/dL   Albumin 4.3 3.6 - 4.8 g/dL   Globulin, Total 2.4 1.5 - 4.5 g/dL   Albumin/Globulin Ratio 1.8 1.1 - 2.5    Bilirubin Total 0.5 0.0 - 1.2 mg/dL   Alkaline Phosphatase 67 39 - 117 IU/L   AST 20 0 - 40 IU/L   ALT 15 0 - 44 IU/L  Lipid panel  Result Value Ref Range   Cholesterol, Total 184 100 - 199 mg/dL   Triglycerides 211 (H) 0 - 149 mg/dL   HDL 38 (L) >39 mg/dL   VLDL Cholesterol Cal 42 (H) 5 - 40 mg/dL   LDL Calculated 104 (H) 0 - 99 mg/dL   Chol/HDL Ratio 4.8 0.0 - 5.0 ratio units  PSA  Result Value Ref Range   Prostate Specific Ag, Serum 5.7 (H) 0.0 - 4.0 ng/mL  TSH  Result Value Ref Range   TSH 0.316 (L) 0.450 - 4.500 uIU/mL  Urinalysis, Routine w reflex microscopic (not at [facility])  Result Value Ref Range   Specific Gravity, UA 1.010 1.005 - 1.030   pH, UA 6.0 5.0 - 7.5   Color, UA Yellow Yellow   Appearance Ur Clear Clear   Leukocytes, UA Negative Negative   Protein, UA Negative Negative/Trace   Glucose, UA Negative Negative   Ketones, UA Negative Negative   RBC, UA Negative Negative   Bilirubin, UA Negative Negative   Urobilinogen, Ur 0.2 0.2 - 1.0 mg/dL   Nitrite, UA Negative Negative  Lithium level  Result Value Ref Range   Lithium Lvl 1.1 0.6 - 1.4 mmol/L      Assessment & Plan:   Problem List Items Addressed This Visit      Cardiovascular and Mediastinum   Hypertension - Primary (Chronic)    The current medical regimen is effective;  continue present plan and medications.         Musculoskeletal and Integument   Ankle arthritis    Discuss ankle arthritis care new shoes weight loss and caution with activity but not in activity Encourage patient to continue dancing Encourage again noted nonsteroidal anti-inflammatory agents May use Tylenol Ankle support          Follow up plan: Return if symptoms worsen or fail to improve.

## 2014-11-14 NOTE — Assessment & Plan Note (Addendum)
Discuss ankle arthritis care new shoes weight loss and caution with activity but not in activity Encourage patient to continue dancing Encourage again noted nonsteroidal anti-inflammatory agents May use Tylenol Ankle support

## 2014-11-21 ENCOUNTER — Telehealth: Payer: Self-pay | Admitting: Family Medicine

## 2014-11-21 NOTE — Telephone Encounter (Signed)
Call plz

## 2014-11-21 NOTE — Telephone Encounter (Signed)
Pain now radiating into great toe.  Please call pt to advise.

## 2014-11-21 NOTE — Telephone Encounter (Signed)
Phone call discussed with patient pain in foot sounding more like gout Patient complicated by chronic kidney disease stage IV Patient will come in tomorrow to be checked Consider bloodwork x-rays etc.

## 2014-11-22 ENCOUNTER — Ambulatory Visit (INDEPENDENT_AMBULATORY_CARE_PROVIDER_SITE_OTHER): Payer: Medicare Other | Admitting: Family Medicine

## 2014-11-22 ENCOUNTER — Encounter: Payer: Self-pay | Admitting: Family Medicine

## 2014-11-22 VITALS — BP 134/73 | HR 64 | Temp 98.3°F | Wt 198.0 lb

## 2014-11-22 DIAGNOSIS — M10371 Gout due to renal impairment, right ankle and foot: Secondary | ICD-10-CM | POA: Diagnosis not present

## 2014-11-22 DIAGNOSIS — N183 Chronic kidney disease, stage 3 unspecified: Secondary | ICD-10-CM

## 2014-11-22 DIAGNOSIS — E669 Obesity, unspecified: Secondary | ICD-10-CM | POA: Diagnosis not present

## 2014-11-22 MED ORDER — COLCHICINE 0.6 MG PO TABS
ORAL_TABLET | ORAL | Status: DC
Start: 2014-11-22 — End: 2014-11-28

## 2014-11-22 NOTE — Progress Notes (Signed)
BP 134/73 mmHg  Pulse 64  Temp(Src) 98.3 F (36.8 C)  Wt 198 lb (89.812 kg)  SpO2 100%   Subjective:    Patient ID: Luis Coss., male    DOB: [birth year], 69 y.o.   MRN: [id number]  HPI: Luis Ashmead. is a 69 y.o. male  Chief Complaint  Patient presents with  . Toe Pain    right big toe, gout?   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 Interim medical history since our last visit reviewed. Allergies and medications reviewed and updated.  Review of Systems  Per HPI unless specifically indicated above     Objective:    BP 134/73 mmHg  Pulse 64  Temp(Src) 98.3 F (36.8 C)  Wt 198 lb (89.812 kg)  SpO2 100%  Wt Readings from Last 3 Encounters:  11/22/14 198 lb (89.812 kg)  11/14/14 199 lb (90.266 kg)  09/25/14 197 lb 12.8 oz (89.721 kg)    Physical Exam  Constitutional: He appears well-developed and well-nourished.  obese  HENT:  Head: Normocephalic.  Eyes: No scleral icterus.  Cardiovascular: Normal rate.   Pulmonary/Chest: Effort normal.  Musculoskeletal: He exhibits no edema.       Right foot: There is tenderness and swelling.  Tender swelling of the 1st MTP right foot  Neurological: He displays no atrophy and no tremor.  Skin: Skin is warm and dry.  Psychiatric: He has a normal mood and affect. His behavior is normal.   Results for orders placed or performed in visit on 09/09/14  CBC with Differential/Platelet  Result Value Ref Range   WBC 3.5 3.4 - 10.8 x10E3/uL   RBC 3.82 (L) 4.14 - 5.80 x10E6/uL   Hemoglobin 12.6 12.6 - 17.7 g/dL   Hematocrit 37.4 (L) 37.5 -  51.0 %   MCV 98 (H) 79 - 97 fL   MCH 33.0 26.6 - 33.0 pg   MCHC 33.7 31.5 - 35.7 g/dL   RDW 14.6 12.3 - 15.4 %   Platelets 138 (L) 150 - 379 x10E3/uL   Neutrophils 63 %   Lymphs 26 %   Monocytes 6 %   Eos 5 %   Basos 0 %   Neutrophils Absolute 2.2 1.4 - 7.0 x10E3/uL   Lymphocytes Absolute 0.9 0.7 - 3.1 x10E3/uL   Monocytes Absolute 0.2 0.1 - 0.9 x10E3/uL   EOS (ABSOLUTE) 0.2 0.0 - 0.4 x10E3/uL   Basophils Absolute 0.0 0.0 - 0.2 x10E3/uL   Immature Granulocytes 0 %   Immature Grans (Abs) 0.0 0.0 - 0.1 x10E3/uL  Comprehensive metabolic panel  Result Value Ref Range   Glucose 105 (H) 65 - 99 mg/dL   BUN 27 8 - 27 mg/dL   Creatinine, Ser 2.01 (H) 0.76 - 1.27 mg/dL   GFR calc non Af Amer 33 (L) >59 mL/min/1.73   GFR calc Af Amer 38 (L) >59 mL/min/1.73   BUN/Creatinine Ratio 13 10 - 22   Sodium 147 (H) 134 - 144 mmol/L   Potassium 4.6 3.5 - 5.2 mmol/L   Chloride 109 (H) 97 - 108 mmol/L   CO2 22  18 - 29 mmol/L   Calcium 9.6 8.6 - 10.2 mg/dL   Total Protein 6.7 6.0 - 8.5 g/dL   Albumin 4.3 3.6 - 4.8 g/dL   Globulin, Total 2.4 1.5 - 4.5 g/dL   Albumin/Globulin Ratio 1.8 1.1 - 2.5   Bilirubin Total 0.5 0.0 - 1.2 mg/dL   Alkaline Phosphatase 67 39 - 117 IU/L   AST 20 0 - 40 IU/L   ALT 15 0 - 44 IU/L  Lipid panel  Result Value Ref Range   Cholesterol, Total 184 100 - 199 mg/dL   Triglycerides 211 (H) 0 - 149 mg/dL   HDL 38 (L) >39 mg/dL   VLDL Cholesterol Cal 42 (H) 5 - 40 mg/dL   LDL Calculated 104 (H) 0 - 99 mg/dL   Chol/HDL Ratio 4.8 0.0 - 5.0 ratio units  PSA  Result Value Ref Range   Prostate Specific Ag, Serum 5.7 (H) 0.0 - 4.0 ng/mL  TSH  Result Value Ref Range   TSH 0.316 (L) 0.450 - 4.500 uIU/mL  Urinalysis, Routine w reflex microscopic (not at [facility])  Result Value Ref Range   Specific Gravity, UA 1.010 1.005 - 1.030   pH, UA 6.0 5.0 - 7.5   Color, UA Yellow Yellow   Appearance Ur Clear Clear   Leukocytes, UA Negative Negative   Protein, UA Negative  Negative/Trace   Glucose, UA Negative Negative   Ketones, UA Negative Negative   RBC, UA Negative Negative   Bilirubin, UA Negative Negative   Urobilinogen, Ur 0.2 0.2 - 1.0 mg/dL   Nitrite, UA Negative Negative  Lithium level  Result Value Ref Range   Lithium Lvl 1.1 0.6 - 1.4 mmol/L      Assessment & Plan:   Problem List Items Addressed This Visit      Musculoskeletal and Integument   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 An after-visit summary was printed and given to the patient at [facility].  Please see the patient instructions which may contain other information and recommendations beyond what is mentioned above in the assessment and plan. Meds ordered this encounter  Medications  . colchicine 0.6 MG tablet    Sig: Take two pills by mouth now and then one pill one hour later    Dispense:  3 tablet    Refill:  0

## 2014-11-22 NOTE — Patient Instructions (Addendum)
You have gout Start the new medicine Avoid trigger foods like gravies, [place], red meat, trout, liver, chicken soup, alcohol, etc.  Gout Gout is an inflammatory arthritis caused by a buildup of uric acid crystals in the joints. Uric acid is a chemical that is normally present in the blood. When the level of uric acid in the blood is too high it can form crystals that deposit in your joints and tissues. This causes joint redness, soreness, and swelling (inflammation). Repeat attacks are common. Over time, uric acid crystals can form into masses (tophi) near a joint, destroying bone and causing disfigurement. Gout is treatable and often preventable. CAUSES  The disease begins with elevated levels of uric acid in the blood. Uric acid is produced by your body when it breaks down a naturally found substance called purines. Certain foods you eat, such as meats and fish, contain high amounts of purines. Causes of an elevated uric acid level include:  Being passed down from parent to child (heredity).  Diseases that cause increased uric acid production (such as obesity, psoriasis, and certain cancers).  Excessive alcohol use.  Diet, especially diets rich in meat and seafood.  Medicines, including certain cancer-fighting medicines (chemotherapy), water pills (diuretics), and aspirin.  Chronic kidney disease. The kidneys are no longer able to remove uric acid well.  Problems with metabolism. Conditions strongly associated with gout include:  Obesity.  High blood pressure.  High cholesterol.  Diabetes. Not everyone with elevated uric acid levels gets gout. It is not understood why some people get gout and others do not. Surgery, joint injury, and eating too much of certain foods are some of the factors that can lead to gout attacks. SYMPTOMS   An attack of gout comes on quickly. It causes intense pain with redness, swelling, and warmth in a joint.  Fever can occur.  Often, only one joint  is involved. Certain joints are more commonly involved:  Base of the big toe.  Knee.  Ankle.  Wrist.  Finger. Without treatment, an attack usually goes away in a few days to weeks. Between attacks, you usually will not have symptoms, which is different from many other forms of arthritis. DIAGNOSIS  Your caregiver will suspect gout based on your symptoms and exam. In some cases, tests may be recommended. The tests may include:  Blood tests.  Urine tests.  X-rays.  Joint fluid exam. This exam requires a needle to remove fluid from the joint (arthrocentesis). Using a microscope, gout is confirmed when uric acid crystals are seen in the joint fluid. TREATMENT  There are two phases to gout treatment: treating the sudden onset (acute) attack and preventing attacks (prophylaxis).  Treatment of an Acute Attack.  Medicines are used. These include anti-inflammatory medicines or steroid medicines.  An injection of steroid medicine into the affected joint is sometimes necessary.  The painful joint is rested. Movement can worsen the arthritis.  You may use warm or cold treatments on painful joints, depending which works best for you.  Treatment to Prevent Attacks.  If you suffer from frequent gout attacks, your caregiver may advise preventive medicine. These medicines are started after the acute attack subsides. These medicines either help your kidneys eliminate uric acid from your body or decrease your uric acid production. You may need to stay on these medicines for a very long time.  The early phase of treatment with preventive medicine can be associated with an increase in acute gout attacks. For this reason, during the first  few months of treatment, your caregiver may also advise you to take medicines usually used for acute gout treatment. Be sure you understand your caregiver's directions. Your caregiver may make several adjustments to your medicine dose before these medicines are  effective.  Discuss dietary treatment with your caregiver or dietitian. Alcohol and drinks high in sugar and fructose and foods such as meat, poultry, and seafood can increase uric acid levels. Your caregiver or dietitian can advise you on drinks and foods that should be limited. HOME CARE INSTRUCTIONS   Do not take aspirin to relieve pain. This raises uric acid levels.  Only take over-the-counter or prescription medicines for pain, discomfort, or fever as directed by your caregiver.  Rest the joint as much as possible. When in bed, keep sheets and blankets off painful areas.  Keep the affected joint raised (elevated).  Apply warm or cold treatments to painful joints. Use of warm or cold treatments depends on which works best for you.  Use crutches if the painful joint is in your leg.  Drink enough fluids to keep your urine clear or pale yellow. This helps your body get rid of uric acid. Limit alcohol, sugary drinks, and fructose drinks.  Follow your dietary instructions. Pay careful attention to the amount of protein you eat. Your daily diet should emphasize fruits, vegetables, whole grains, and fat-free or low-fat milk products. Discuss the use of coffee, vitamin C, and cherries with your caregiver or dietitian. These may be helpful in lowering uric acid levels.  Maintain a healthy body weight. SEEK MEDICAL CARE IF:   You develop diarrhea, vomiting, or any side effects from medicines.  You do not feel better in 24 hours, or you are getting worse. SEEK IMMEDIATE MEDICAL CARE IF:   Your joint becomes suddenly more tender, and you have chills or a fever. MAKE SURE YOU:   Understand these instructions.  Will watch your condition.  Will get help right away if you are not doing well or get worse. Document Released: 03/05/2000 Document Revised: 07/23/2013 Document Reviewed: 10/20/2011 [facility] Patient Information 2015 [place], [place]. This information is not intended to replace  advice given to you by your health care provider. Make sure you discuss any questions you have with your health care provider.

## 2014-11-27 DIAGNOSIS — N184 Chronic kidney disease, stage 4 (severe): Secondary | ICD-10-CM

## 2014-11-27 DIAGNOSIS — E669 Obesity, unspecified: Secondary | ICD-10-CM | POA: Insufficient documentation

## 2014-11-27 HISTORY — DX: Chronic kidney disease, stage 4 (severe): N18.4

## 2014-11-27 NOTE — Assessment & Plan Note (Signed)
Likely the etiology for recent gout flare, combined with dietary indiscretion, intake of purine-rich food lately; avoid NSAIDs

## 2014-11-27 NOTE — Assessment & Plan Note (Signed)
Added to problem list; should discuss with primary provider at f/u

## 2014-11-27 NOTE — Assessment & Plan Note (Signed)
Explained dx; will treat with colchicine short-term; avoiding NSAIDs because of his stage 3 CKD; avoid/limit purine-rich foods; see AVS

## 2014-11-28 ENCOUNTER — Telehealth: Payer: Self-pay | Admitting: Family Medicine

## 2014-11-28 MED ORDER — COLCHICINE 0.6 MG PO TABS: ORAL_TABLET | ORAL | Status: DC

## 2014-11-28 NOTE — Telephone Encounter (Signed)
He is feeling better; wonders what he can eat Chicken is safer than [place] Red meats are worst He does not cook, eats out a lot We talked about diet options Talk with Dr. Jeananne Rama and kidney doctor about options since they know him Sent Rx for another colchicine in case he has another flare while on his cruise

## 2014-12-20 ENCOUNTER — Encounter: Payer: Self-pay | Admitting: *Deleted

## 2014-12-23 ENCOUNTER — Encounter: Payer: Self-pay | Admitting: *Deleted

## 2014-12-23 ENCOUNTER — Ambulatory Visit: Payer: Medicare Other | Admitting: Anesthesiology

## 2014-12-23 ENCOUNTER — Ambulatory Visit
Admission: RE | Admit: 2014-12-23 | Discharge: 2014-12-23 | Disposition: A | Discharge status: Home / Self Care | Payer: Medicare Other | Source: Ambulatory Visit | Attending: Unknown Physician Specialty | Admitting: Unknown Physician Specialty

## 2014-12-23 ENCOUNTER — Encounter
Admission: RE | Disposition: A | Discharge status: Home / Self Care | Payer: Self-pay | Source: Ambulatory Visit | Attending: Unknown Physician Specialty

## 2014-12-23 DIAGNOSIS — N189 Chronic kidney disease, unspecified: Secondary | ICD-10-CM | POA: Diagnosis not present

## 2014-12-23 DIAGNOSIS — Z87891 Personal history of nicotine dependence: Secondary | ICD-10-CM | POA: Diagnosis not present

## 2014-12-23 DIAGNOSIS — I129 Hypertensive chronic kidney disease with stage 1 through stage 4 chronic kidney disease, or unspecified chronic kidney disease: Secondary | ICD-10-CM | POA: Diagnosis not present

## 2014-12-23 DIAGNOSIS — F319 Bipolar disorder, unspecified: Secondary | ICD-10-CM | POA: Diagnosis not present

## 2014-12-23 DIAGNOSIS — D124 Benign neoplasm of descending colon: Secondary | ICD-10-CM | POA: Insufficient documentation

## 2014-12-23 DIAGNOSIS — K635 Polyp of colon: Secondary | ICD-10-CM | POA: Insufficient documentation

## 2014-12-23 DIAGNOSIS — N2581 Secondary hyperparathyroidism of renal origin: Secondary | ICD-10-CM | POA: Insufficient documentation

## 2014-12-23 DIAGNOSIS — G4730 Sleep apnea, unspecified: Secondary | ICD-10-CM | POA: Diagnosis not present

## 2014-12-23 DIAGNOSIS — Z79899 Other long term (current) drug therapy: Secondary | ICD-10-CM | POA: Diagnosis not present

## 2014-12-23 DIAGNOSIS — E785 Hyperlipidemia, unspecified: Secondary | ICD-10-CM | POA: Diagnosis not present

## 2014-12-23 DIAGNOSIS — D125 Benign neoplasm of sigmoid colon: Secondary | ICD-10-CM | POA: Diagnosis not present

## 2014-12-23 DIAGNOSIS — I10 Essential (primary) hypertension: Secondary | ICD-10-CM | POA: Diagnosis not present

## 2014-12-23 DIAGNOSIS — D123 Benign neoplasm of transverse colon: Secondary | ICD-10-CM | POA: Insufficient documentation

## 2014-12-23 DIAGNOSIS — D122 Benign neoplasm of ascending colon: Secondary | ICD-10-CM | POA: Insufficient documentation

## 2014-12-23 DIAGNOSIS — Z9852 Vasectomy status: Secondary | ICD-10-CM | POA: Insufficient documentation

## 2014-12-23 DIAGNOSIS — Z9889 Other specified postprocedural states: Secondary | ICD-10-CM | POA: Insufficient documentation

## 2014-12-23 DIAGNOSIS — Z859 Personal history of malignant neoplasm, unspecified: Secondary | ICD-10-CM | POA: Diagnosis not present

## 2014-12-23 DIAGNOSIS — D127 Benign neoplasm of rectosigmoid junction: Secondary | ICD-10-CM | POA: Diagnosis not present

## 2014-12-23 DIAGNOSIS — Z86010 Personal history of colonic polyps: Secondary | ICD-10-CM | POA: Diagnosis not present

## 2014-12-23 DIAGNOSIS — Z888 Allergy status to other drugs, medicaments and biological substances status: Secondary | ICD-10-CM | POA: Insufficient documentation

## 2014-12-23 HISTORY — PX: COLONOSCOPY WITH PROPOFOL: SHX5780

## 2014-12-23 SURGERY — COLONOSCOPY WITH PROPOFOL
Anesthesia: General

## 2014-12-23 MED ORDER — MIDAZOLAM HCL 2 MG/2ML IJ SOLN
INTRAMUSCULAR | Status: DC | PRN
  Administered 2014-12-23: 2 mg via INTRAVENOUS

## 2014-12-23 MED ORDER — PROPOFOL 500 MG/50ML IV EMUL
INTRAVENOUS | Status: DC | PRN
  Administered 2014-12-23: 25 ug/kg/min via INTRAVENOUS

## 2014-12-23 MED ORDER — SODIUM CHLORIDE 0.9 % IV SOLN
INTRAVENOUS | Status: DC
  Administered 2014-12-23: 11:00:00 via INTRAVENOUS

## 2014-12-23 NOTE — Op Note (Signed)
[facility] Gastroenterology Patient Name: Luis Miller Procedure Date: 12/23/2014 12:11 PM MRN: [id number] Account #: [id number] Date of Birth: Jan 03, 1946 Admit Type: Outpatient Age: 69 Room: [facility] ENDO ROOM 1 Gender: Male Note Status: Finalized Procedure:         Colonoscopy Indications:       Personal history of colonic polyps Providers:         Manya Silvas, MD Referring MD:      Guadalupe Maple, MD (Referring MD) Medicines:         Propofol per Anesthesia Complications:     No immediate complications. Procedure:         Pre-Anesthesia Assessment:                    - After reviewing the risks and benefits, the patient was                     deemed in satisfactory condition to undergo the procedure.                    After obtaining informed consent, the colonoscope was                     passed under direct vision. Throughout the procedure, the                     patient's blood pressure, pulse, and oxygen saturations                     were monitored continuously. The Colonoscope was                     introduced through the anus and advanced to the the cecum,                     identified by appendiceal orifice and ileocecal valve. The                     colonoscopy was somewhat difficult. The patient tolerated                     the procedure well. The quality of the bowel preparation                     was good. Findings:      A medium polyp was found in the descending colon. The polyp was sessile.       The polyp was removed with a hot snare. Resection and retrieval were       complete. To prevent bleeding after the polypectomy, two hemostatic       clips were successfully placed (MRI compatible). There was no bleeding       at the end of the procedure.      A small polyp was found in the transverse colon. The polyp was sessile.       The polyp was removed with a hot snare. Resection and retrieval were       complete. To prevent bleeding  after the polypectomy, two hemostatic       clips were successfully placed. There was no bleeding at the end of the       procedure.      A small polyp was found in the ascending colon. The polyp was sessile.       The  polyp was removed with a hot snare. Resection and retrieval were       complete.      A diminutive polyp was found in the cecum. The polyp was sessile. The       polyp was removed with a cold biopsy forceps. Resection and retrieval       were complete. To prevent bleeding after the polypectomy, two hemostatic       clips were successfully placed.      A small polyp was found in the recto-sigmoid colon. The polyp was       sessile. The polyp was removed with a hot snare. Resection and retrieval       were complete. To prevent bleeding after the polypectomy, one hemostatic       clip was successfully placed. There was no bleeding at the end of the       procedure.      There was an area in descending and splenic flexure showing dilated       venules. Impression:        - One medium polyp in the descending colon. Resected and                     retrieved. Clips were placed.                    - One small polyp in the transverse colon. Resected and                     retrieved. Clips were placed.                    - One small polyp in the ascending colon. Resected and                     retrieved.                    - One diminutive polyp in the cecum. Resected and                     retrieved. Clips were placed.                    - One small polyp at the recto-sigmoid colon. Resected and                     retrieved. Clip was placed. Recommendation:    - Await pathology results. Manya Silvas, MD 12/23/2014 1:11:16 PM This report has been signed electronically. Number of Addenda: 0 Note Initiated On: 12/23/2014 12:11 PM Scope Withdrawal Time: 0 hours 39 minutes 25 seconds  Total Procedure Duration: 0 hours 44 minutes 46 seconds       [facility]

## 2014-12-23 NOTE — Anesthesia Preprocedure Evaluation (Signed)
Anesthesia Evaluation  Patient identified by MRN, date of birth, ID band Patient awake    Reviewed: Allergy & Precautions, H&P , NPO status , Patient's Chart, lab work & pertinent test results, reviewed documented beta blocker date and time   Airway Mallampati: II  TM Distance: >3 FB Neck ROM: full    Dental no notable dental hx.    Pulmonary neg pulmonary ROS, former smoker,    Pulmonary exam normal breath sounds clear to auscultation       Cardiovascular Exercise Tolerance: Good hypertension, negative cardio ROS   Rhythm:regular Rate:Normal     Neuro/Psych negative neurological ROS  negative psych ROS   GI/Hepatic negative GI ROS, Neg liver ROS, PUD,   Endo/Other  negative endocrine ROS  Renal/GU Renal diseasenegative Renal ROS  negative genitourinary   Musculoskeletal   Abdominal   Peds  Hematology negative hematology ROS (+)   Anesthesia Other Findings   Reproductive/Obstetrics negative OB ROS                             Anesthesia Physical Anesthesia Plan  ASA: III  Anesthesia Plan: General   Post-op Pain Management:    Induction:   Airway Management Planned:   Additional Equipment:   Intra-op Plan:   Post-operative Plan:   Informed Consent: I have reviewed the patients History and Physical, chart, labs and discussed the procedure including the risks, benefits and alternatives for the proposed anesthesia with the patient or authorized representative who has indicated his/her understanding and acceptance.   Dental Advisory Given  Plan Discussed with: CRNA  Anesthesia Plan Comments:         Anesthesia Quick Evaluation

## 2014-12-23 NOTE — Anesthesia Postprocedure Evaluation (Signed)
  Anesthesia Post-op Note  Patient: Luis Miller.  Procedure(s) Performed: Procedure(s): COLONOSCOPY WITH PROPOFOL (N/A)  Anesthesia type:General  Patient location: PACU  Post pain: Pain level controlled  Post assessment: Post-op Vital signs reviewed, Patient's Cardiovascular Status Stable, Respiratory Function Stable, Patent Airway and No signs of Nausea or vomiting  Post vital signs: Reviewed and stable  Last Vitals:  Filed Vitals:   12/23/14 1340  BP: 148/86  Pulse:   Temp:   Resp:     Level of consciousness: awake, alert  and patient cooperative  Complications: No apparent anesthesia complications

## 2014-12-23 NOTE — Transfer of Care (Signed)
Immediate Anesthesia Transfer of Care Note  Patient: Luis Miller.  Procedure(s) Performed: Procedure(s): COLONOSCOPY WITH PROPOFOL (N/A)  Patient Location: PACU  Anesthesia Type:General  Level of Consciousness: awake, alert , oriented and patient cooperative  Airway & Oxygen Therapy: Patient Spontanous Breathing and Patient connected to nasal cannula oxygen  Post-op Assessment: Report given to RN and Post -op Vital signs reviewed and stable  Post vital signs: Reviewed and stable  Last Vitals:  Filed Vitals:   12/23/14 1315  BP: 143/76  Pulse: 47  Temp: 36.6 C  Resp: 17    Complications: No apparent anesthesia complications

## 2014-12-24 ENCOUNTER — Encounter: Payer: Self-pay | Admitting: Unknown Physician Specialty

## 2014-12-24 LAB — SURGICAL PATHOLOGY

## 2014-12-26 NOTE — H&P (Signed)
Primary Care Physician:  Golden Pop, MD Primary Gastroenterologist:  Dr. Vira Agar  Pre-Procedure History & Physical: HPI:  Luis Miller. is a 69 y.o. male is here for an colonoscopy.   Past Medical History  Diagnosis Date  . Diabetes insipidus ([place])   . 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 Bipolar affective ([place])   . Cancer ([place]) malignant melanoma  . Internal hemorrhoids   . Hyperlipidemia   . Hypertension   . Chronic kidney disease   . Apnea, sleep   . Impotence   . Secondary hyperparathyroidism of renal origin ([place])   . Erectile dysfunction     Past Surgical History  Procedure Laterality Date  . Vasectomy    . Tonsillectomy    . Bone marrow biopsy    . Penile prosthesis implant    . Colonoscopy with propofol N/A 12/23/2014    Procedure: COLONOSCOPY WITH PROPOFOL;  Surgeon: Manya Silvas, MD;  Location: [facility] ENDOSCOPY;  Service: Endoscopy;  Laterality: N/A;    Prior to Admission medications   Medication Sig Start Date End Date Taking? Authorizing Provider  aMILoride (MIDAMOR) 5 MG tablet Take 1 tablet (5 mg total) by mouth daily. 09/09/14  Yes Guadalupe Maple, MD  amLODipine (NORVASC) 10 MG tablet Take 1 tablet (10 mg total) by mouth daily. 09/09/14  Yes Guadalupe Maple, MD  azelastine (ASTELIN) 0.1 % nasal spray Place 2 sprays into both nostrils as needed for rhinitis. Use in each nostril as directed   Yes Historical Provider, MD  benazepril (LOTENSIN) 40 MG tablet  08/27/14  Yes Historical Provider, MD  carvedilol (COREG) 6.25 MG tablet Take 6.25 mg by mouth daily.   Yes Historical Provider, MD  hydrALAZINE (APRESOLINE) 100 MG tablet Take 100 mg by mouth 3 (three) times daily.   Yes Historical Provider, MD  lithium carbonate (ESKALITH) 450 MG CR tablet  08/27/14  Yes Historical Provider, MD  Multiple Vitamin (MULTIVITAMIN) tablet Take 1 tablet by mouth daily.   Yes Historical Provider, MD  QUEtiapine (SEROQUEL) 200 MG tablet Take 200 mg by mouth at bedtime.    Yes Historical Provider, MD  VITAMIN D, CHOLECALCIFEROL, PO Take by mouth.   Yes Historical Provider, MD  benzonatate (TESSALON) 100 MG capsule  08/12/14   Historical Provider, MD  colchicine 0.6 MG tablet Take two pills by mouth now and then one pill one hour later Patient not taking: Reported on 12/23/2014 11/28/14   Arnetha Courser, MD  VENTOLIN HFA 108 (90 BASE) MCG/ACT inhaler  08/12/14   Historical Provider, MD    Allergies as of 10/22/2014 - Review Complete 09/25/2014  Allergen Reaction Noted  . Mirtazapine Rash 09/09/2014  . Olanzapine Rash 09/09/2014  . Strattera [atomoxetine hcl]  07/12/2014    Family History  Problem Relation Age of Onset  . Family history unknown: Yes    Social History   Social History  . Marital Status: Divorced    Spouse Name: N/A  . Number of Children: N/A  . Years of Education: N/A   Occupational History  . Not on file.   Social History Main Topics  . Smoking status: Former Smoker    Quit date: 09/08/1972  . Smokeless tobacco: Never Used  . Alcohol Use: No  . Drug Use: No  . Sexual Activity: Not on file   Other Topics Concern  . Not on file   Social History Narrative    Review of Systems: See HPI, otherwise negative ROS  Physical  Exam: BP 148/86 mmHg  Pulse 46  Temp(Src) 97.9 F (36.6 C) (Tympanic)  Resp 18  Ht _0  (1.676 m)  Wt 90.719 kg (200 lb)  BMI 32.30 kg/m2  SpO2 100% General:   Alert,  pleasant and cooperative in NAD Head:  Normocephalic and atraumatic. Neck:  Supple; no masses or thyromegaly. Lungs:  Clear throughout to auscultation.    Heart:  Regular rate and rhythm. Abdomen:  Soft, nontender and nondistended. Normal bowel sounds, without guarding, and without rebound.   Neurologic:  Alert and  oriented x4;  grossly normal neurologically.  Impression/Plan: [name] is here for an colonoscopy to be performed for Personal hx of colon polyps  Risks, benefits, limitations, and alternatives regarding   colonoscopy have been reviewed with the patient.  Questions have been answered.  All parties agreeable.   Gaylyn Cheers, MD  12/26/2014, 2:09 PM

## 2015-01-21 ENCOUNTER — Telehealth: Payer: Self-pay

## 2015-01-21 NOTE — Telephone Encounter (Signed)
yes

## 2015-01-21 NOTE — Telephone Encounter (Signed)
Patient's psychiatrist (Dr. Bridgett Larsson) is retiring Patient has enough medication for approx. 4 months He wants to know if you would be willing to fill his meds  Until he can replace his therapist  Looks like Lithium and Seroquil

## 2015-01-21 NOTE — Telephone Encounter (Signed)
Called patient to confirm with him [facility] agreed to fill meds until new therapist is found

## 2015-01-22 ENCOUNTER — Other Ambulatory Visit: Payer: Self-pay | Admitting: Family Medicine

## 2015-01-23 DIAGNOSIS — L298 Other pruritus: Secondary | ICD-10-CM | POA: Diagnosis not present

## 2015-01-23 DIAGNOSIS — L820 Inflamed seborrheic keratosis: Secondary | ICD-10-CM | POA: Diagnosis not present

## 2015-01-27 DIAGNOSIS — N2581 Secondary hyperparathyroidism of renal origin: Secondary | ICD-10-CM | POA: Diagnosis not present

## 2015-01-27 DIAGNOSIS — I10 Essential (primary) hypertension: Secondary | ICD-10-CM | POA: Diagnosis not present

## 2015-01-27 DIAGNOSIS — E232 Diabetes insipidus: Secondary | ICD-10-CM | POA: Diagnosis not present

## 2015-01-27 DIAGNOSIS — N183 Chronic kidney disease, stage 3 (moderate): Secondary | ICD-10-CM | POA: Diagnosis not present

## 2015-03-11 ENCOUNTER — Ambulatory Visit: Payer: Medicare Other | Admitting: Family Medicine

## 2015-03-14 DIAGNOSIS — L538 Other specified erythematous conditions: Secondary | ICD-10-CM | POA: Diagnosis not present

## 2015-03-14 DIAGNOSIS — Z85828 Personal history of other malignant neoplasm of skin: Secondary | ICD-10-CM | POA: Diagnosis not present

## 2015-03-14 DIAGNOSIS — B078 Other viral warts: Secondary | ICD-10-CM | POA: Diagnosis not present

## 2015-03-14 DIAGNOSIS — D485 Neoplasm of uncertain behavior of skin: Secondary | ICD-10-CM | POA: Diagnosis not present

## 2015-03-14 DIAGNOSIS — Z85820 Personal history of malignant melanoma of skin: Secondary | ICD-10-CM | POA: Diagnosis not present

## 2015-03-14 DIAGNOSIS — L0889 Other specified local infections of the skin and subcutaneous tissue: Secondary | ICD-10-CM | POA: Diagnosis not present

## 2015-03-14 DIAGNOSIS — D1801 Hemangioma of skin and subcutaneous tissue: Secondary | ICD-10-CM | POA: Diagnosis not present

## 2015-04-01 DIAGNOSIS — D485 Neoplasm of uncertain behavior of skin: Secondary | ICD-10-CM | POA: Diagnosis not present

## 2015-04-07 ENCOUNTER — Telehealth: Payer: Self-pay | Admitting: Family Medicine

## 2015-04-07 MED ORDER — LITHIUM CARBONATE ER 450 MG PO TBCR: 450.0000 mg | EXTENDED_RELEASE_TABLET | Freq: Every day | ORAL | Status: DC

## 2015-04-07 MED ORDER — QUETIAPINE FUMARATE 200 MG PO TABS: 200.0000 mg | ORAL_TABLET | Freq: Every day | ORAL | Status: DC

## 2015-04-07 NOTE — Telephone Encounter (Signed)
Forward to provider

## 2015-04-07 NOTE — Telephone Encounter (Signed)
Pt needs refill on seroquel and lithium carbonate sent to [facility] mail order

## 2015-04-07 NOTE — Telephone Encounter (Signed)
Rx sent to his pharmacy

## 2015-04-17 DIAGNOSIS — D485 Neoplasm of uncertain behavior of skin: Secondary | ICD-10-CM | POA: Diagnosis not present

## 2015-04-17 DIAGNOSIS — L905 Scar conditions and fibrosis of skin: Secondary | ICD-10-CM | POA: Diagnosis not present

## 2015-04-17 DIAGNOSIS — D2261 Melanocytic nevi of right upper limb, including shoulder: Secondary | ICD-10-CM | POA: Diagnosis not present

## 2015-05-13 ENCOUNTER — Encounter: Payer: Self-pay | Admitting: Family Medicine

## 2015-05-13 ENCOUNTER — Ambulatory Visit (INDEPENDENT_AMBULATORY_CARE_PROVIDER_SITE_OTHER): Payer: Medicare Other | Admitting: Family Medicine

## 2015-05-13 VITALS — BP 129/78 | HR 54 | Temp 97.6°F | Ht 67.0 in | Wt 204.0 lb

## 2015-05-13 DIAGNOSIS — F3176 Bipolar disorder, in full remission, most recent episode depressed: Secondary | ICD-10-CM | POA: Diagnosis not present

## 2015-05-13 DIAGNOSIS — Z23 Encounter for immunization: Secondary | ICD-10-CM

## 2015-05-13 DIAGNOSIS — I10 Essential (primary) hypertension: Secondary | ICD-10-CM | POA: Diagnosis not present

## 2015-05-13 NOTE — Assessment & Plan Note (Signed)
The current medical regimen is effective;  continue present plan and medications.  

## 2015-05-13 NOTE — Progress Notes (Signed)
BP 129/78 mmHg  Pulse 54  Temp(Src) 97.6 F (36.4 C)  Ht 5\' 7"  (1.702 m)  Wt 204 lb (92.534 kg)  BMI 31.94 kg/m2  SpO2 96%   Subjective:    Patient ID: Luis Coss., male    DOB: 09-03-1945, 70 y.o.   MRN: [id number]  HPI: Luis Miller. is a 70 y.o. male  Chief Complaint  Patient presents with  . Depression  . Hypertension   patient recheck blood pressure doing well no complaints from medications takes faithfully without side effects Nerves depression doing well in his usual psychiatrist has retired waiting to get plugged in with someone new in the meantime I'm prescribing his medicines CK D stable  Relevant past medical, surgical, family and social history reviewed and updated as indicated. Interim medical history since our last visit reviewed. Allergies and medications reviewed and updated.  Review of Systems  Constitutional: Negative.   Respiratory: Negative.   Cardiovascular: Negative.     Per HPI unless specifically indicated above     Objective:    BP 129/78 mmHg  Pulse 54  Temp(Src) 97.6 F (36.4 C)  Ht 5\' 7"  (1.702 m)  Wt 204 lb (92.534 kg)  BMI 31.94 kg/m2  SpO2 96%  Wt Readings from Last 3 Encounters:  05/13/15 204 lb (92.534 kg)  12/23/14 200 lb (90.719 kg)  11/22/14 198 lb (89.812 kg)    Physical Exam  Constitutional: He is oriented to person, place, and time. He appears well-developed and well-nourished. No distress.  HENT:  Head: Normocephalic and atraumatic.  Right Ear: Hearing normal.  Left Ear: Hearing normal.  Nose: Nose normal.  Eyes: Conjunctivae and lids are normal. Right eye exhibits no discharge. Left eye exhibits no discharge. No scleral icterus.  Cardiovascular: Normal rate, regular rhythm and normal heart sounds.   Pulmonary/Chest: Effort normal and breath sounds normal. No respiratory distress.  Musculoskeletal: Normal range of motion.  Neurological: He is alert and oriented to person, place, and time.  Skin: Skin  is intact. No rash noted.  Psychiatric: He has a normal mood and affect. His speech is normal and behavior is normal. Judgment and thought content normal. Cognition and memory are normal.    Results for orders placed or performed during the hospital encounter of 12/23/14  Surgical pathology  Result Value Ref Range   SURGICAL PATHOLOGY      Surgical Pathology CASE: [id number] PATIENT: [name] Report     SPECIMEN SUBMITTED: A. Colon polyp,cecum,cbx B. Colon polyp, ascending, hot snare C. Colon polyp, transverse, hot snare D. Colon polyp, descending, hot snare E. Colon polyp, recto-sigmoid, hot snare  CLINICAL HISTORY: None provided  PRE-OPERATIVE DIAGNOSIS: PH COLON POLYPS  POST-OPERATIVE DIAGNOSIS: polyps     DIAGNOSIS: A. COLON POLYP, CECUM; COLD BIOPSY: - HYPERPLASTIC POLYP. - NEGATIVE FOR DYSPLASIA AND MALIGNANCY.  B. COLON POLYP, ASCENDING; HOT SNARE: - TUBULAR ADENOMA. - NEGATIVE FOR HIGH GRADE DYSPLASIA AND MALIGNANCY.  C. COLON POLYP, TRANSVERSE; HOT SNARE: - TUBULAR ADENOMA. - NEGATIVE FOR HIGH GRADE DYSPLASIA AND MALIGNANCY.  D. COLON POLYP, DESCENDING; HOT SNARE: - TUBULAR ADENOMA. - NEGATIVE FOR HIGH GRADE DYSPLASIA AND MALIGNANCY.  E. COLON POLYP, RECTOSIGMOID; HOT SNARE: - TUBULAR ADENOMA. - NEGATIVE FOR HIGH GRADE DYSPLASIA AND MALIGNAN CY.       GROSS DESCRIPTION:  A. Labeled: C biopsy cecum polyp  Tissue fragment(s): 1  Size: 0.2 cm  Description: pink  Entirely submitted in one cassette(s).   B. Labeled:  hot snare AC polyp  Tissue fragment(s): multiple  Size: aggregate, 1.1 x 0.5 by 0.1 cm  Description: pink fragments  Entirely submitted in one cassette(s).   C. Labeled: hot snare TC polyp  Tissue fragment(s): multiple  Size: 0.6 x 0.3 x 0.1 cm  Description: pink fragments and fecal material  Entirely submitted in one cassette(s).   D. Labeled: hot snare DC polyp  Tissue  fragment(s): multiple  Size: aggregate, 2.4 x 0.8 x 0.1 cm  Description: pink-tan fragment and fecal material  Entirely submitted in one cassette(s).   E. Labeled: hot snare rectosigmoid  Tissue fragment(s): 1  Size: 0.5 cm  Description: pink polypoid fragment, inked blue  Entirely submitted in one cassette(s).    Final Diagnosis performed by Quay Burow, MD.  Electronically signed 1 0/06/2014 11:44:42AM    The electronic signature indicates that the named Attending Pathologist has evaluated the specimen  Technical component performed at [facility], [address] [place], [place] [address] Lab: [phone] Dir: Darrick Penna. Evette Doffing, MD  Professional component performed at [facility], [facility], [place], [place],  [address] Lab: [phone] Dir: Dellia Nims. Reuel Derby, MD        Assessment & Plan:   Problem List Items Addressed This Visit      Cardiovascular and Mediastinum   Hypertension (Chronic)    The current medical regimen is effective;  continue present plan and medications.         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        Other Visit Diagnoses    Immunization due    -  Primary    Relevant Orders    Flu Vaccine QUAD 36+ mos PF IM (Fluarix & Fluzone Quad PF) (Completed)        Follow up plan: Return for Physical exam in June.

## 2015-05-23 ENCOUNTER — Encounter: Payer: Self-pay | Admitting: *Deleted

## 2015-05-26 ENCOUNTER — Other Ambulatory Visit: Payer: Self-pay | Admitting: Family Medicine

## 2015-05-26 DIAGNOSIS — E232 Diabetes insipidus: Secondary | ICD-10-CM | POA: Diagnosis not present

## 2015-05-26 DIAGNOSIS — N2581 Secondary hyperparathyroidism of renal origin: Secondary | ICD-10-CM | POA: Diagnosis not present

## 2015-05-26 DIAGNOSIS — I10 Essential (primary) hypertension: Secondary | ICD-10-CM | POA: Diagnosis not present

## 2015-05-26 DIAGNOSIS — N183 Chronic kidney disease, stage 3 (moderate): Secondary | ICD-10-CM | POA: Diagnosis not present

## 2015-05-29 ENCOUNTER — Ambulatory Visit: Payer: Medicare Other | Admitting: Urology

## 2015-06-03 ENCOUNTER — Encounter: Payer: Self-pay | Admitting: Urology

## 2015-06-03 ENCOUNTER — Ambulatory Visit (INDEPENDENT_AMBULATORY_CARE_PROVIDER_SITE_OTHER): Payer: Medicare Other | Admitting: Urology

## 2015-06-03 VITALS — BP 174/94 | HR 57 | Ht 67.0 in | Wt 201.9 lb

## 2015-06-03 DIAGNOSIS — N401 Enlarged prostate with lower urinary tract symptoms: Secondary | ICD-10-CM | POA: Diagnosis not present

## 2015-06-03 DIAGNOSIS — R972 Elevated prostate specific antigen [PSA]: Secondary | ICD-10-CM

## 2015-06-03 DIAGNOSIS — N138 Other obstructive and reflux uropathy: Secondary | ICD-10-CM

## 2015-06-03 DIAGNOSIS — N529 Male erectile dysfunction, unspecified: Secondary | ICD-10-CM

## 2015-06-03 DIAGNOSIS — N528 Other male erectile dysfunction: Secondary | ICD-10-CM | POA: Diagnosis not present

## 2015-06-03 NOTE — Progress Notes (Signed)
06/03/2015 3:32 PM   Luis Miller. 1946-02-07 [id number]  Referring provider: Guadalupe Maple, MD [address] [place], [place] [address]  Chief Complaint  Patient presents with  . Elevated PSA  . Benign Prostatic Hypertrophy    HPI: Patient is a 70 year old Caucasian male with a history of elevated PSA, erectile dysfunction and BPH with LUTS who presents today for a 1 year follow-up.  Elevated PSA Patient had a negative bx in 2002 for a PSA of 4.03 ng/mL.  His most recent PSA was 5.7 ng/mL on 09/09/2014.  A PSA is drawn today.    Erectile dysfunction Patient underwent a penile prosthesis placement on 02/18/2014 with Dr. Elnoria Howard.   He has been unsatisfied with the results.  He states that the tip of the prosthesis does not reach into his glands, therefore his glands are flaccid making intercourse difficult.   He also has a curvature caused by a Peyronie's plaque that was not corrected during the surgery.   BPH WITH LUTS His IPSS score today is 5, which is mild lower urinary tract symptomatology. He is pleased with his quality life due to his urinary symptoms.  He denies any dysuria, hematuria or suprapubic pain.  He also denies any recent fevers, chills, nausea or vomiting.  He is adopted.       IPSS      06/03/15 1400       International Prostate Symptom Score   How often have you had the sensation of not emptying your bladder? Not at All     How often have you had to urinate less than every two hours? Less than half the time     How often have you found you stopped and started again several times when you urinated? Not at All     How often have you found it difficult to postpone urination? Less than 1 in 5 times     How often have you had a weak urinary stream? Not at All     How often have you had to strain to start urination? Less than 1 in 5 times     How many times did you typically get up at night to urinate? 1 Time     Total IPSS Score 5     Quality of Life due  to urinary symptoms   If you were to spend the rest of your life with your urinary condition just the way it is now how would you feel about that? Pleased        Score:  1-7 Mild 8-19 Moderate 20-35 Severe    PMH: Past Medical History  Diagnosis Date  . Diabetes insipidus ([place])   . 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 Bipolar affective ([place])   . Cancer ([place]) malignant melanoma  . Internal hemorrhoids   . Hyperlipidemia   . Hypertension   . Chronic kidney disease   . Apnea, sleep   . Impotence   . Secondary hyperparathyroidism of renal origin ([place])   . Erectile dysfunction   . Urinary hesitancy   . Skin cancer   . Urinary frequency   . Testicular hypofunction   . BPH (benign prostatic hyperplasia)   . IBS (irritable bowel syndrome)   . Anemia   . Crohn's disease ([place])   . Peyronie disease   . Paraphimosis     Surgical History: Past Surgical History  Procedure Laterality Date  . Vasectomy    . Tonsillectomy    .  Bone marrow biopsy    . Penile prosthesis implant    . Colonoscopy with propofol N/A 12/23/2014    Procedure: COLONOSCOPY WITH PROPOFOL;  Surgeon: Manya Silvas, MD;  Location: [facility] ENDOSCOPY;  Service: Endoscopy;  Laterality: N/A;  . Melanoma excision    . Arm fracture      Home Medications:    Medication List       This list is accurate as of: 06/03/15  3:32 PM.  Always use your most recent med list.               aMILoride 5 MG tablet  Commonly known as:  MIDAMOR  TAKE 1 TABLET ONE TIME DAILY     amLODipine 10 MG tablet  Commonly known as:  NORVASC  TAKE 1 TABLET ONE TIME DAILY     benazepril 40 MG tablet  Commonly known as:  LOTENSIN  Take 40 mg by mouth daily.     carvedilol 6.25 MG tablet  Commonly known as:  COREG  Take 6.25 mg by mouth daily.     hydrALAZINE 100 MG tablet  Commonly known as:  APRESOLINE  Take 100 mg by mouth 3 (three) times daily.     lithium carbonate 450 MG CR tablet  Commonly known as:  ESKALITH   Take 1 tablet (450 mg total) by mouth daily.     multivitamin tablet  Take 1 tablet by mouth daily.     QUEtiapine 200 MG tablet  Commonly known as:  SEROQUEL  Take 1 tablet (200 mg total) by mouth at bedtime.     VITAMIN D (CHOLECALCIFEROL) PO  Take by mouth.        Allergies:  Allergies  Allergen Reactions  . Mirtazapine Rash  . Olanzapine Rash  . Strattera [Atomoxetine Hcl]     Urinary sx    Family History: Family History  Problem Relation Age of Onset  . Family history unknown: Yes    Social History:  reports that he quit smoking about 42 years ago. He has never used smokeless tobacco. He reports that he drinks alcohol. He reports that he does not use illicit drugs.  ROS: UROLOGY Frequent Urination?: No Hard to postpone urination?: No Burning/pain with urination?: No Get up at night to urinate?: Yes Leakage of urine?: No Urine stream starts and stops?: No Trouble starting stream?: No Do you have to strain to urinate?: No Blood in urine?: No Urinary tract infection?: No Sexually transmitted disease?: No Injury to kidneys or bladder?: No Painful intercourse?: No Weak stream?: No Erection problems?: No Penile pain?: No  Gastrointestinal Nausea?: No Vomiting?: No Indigestion/heartburn?: No Diarrhea?: No Constipation?: No  Constitutional Fever: No Night sweats?: No Weight loss?: No Fatigue?: No  Skin Skin rash/lesions?: No Itching?: No  Eyes Blurred vision?: No Double vision?: No  Ears/Nose/Throat Sore throat?: No Sinus problems?: No  Hematologic/Lymphatic Swollen glands?: No Easy bruising?: Yes  Cardiovascular Leg swelling?: Yes Chest pain?: No  Respiratory Cough?: No Shortness of breath?: No  Endocrine Excessive thirst?: No  Musculoskeletal Back pain?: No Joint pain?: Yes  Neurological Headaches?: No Dizziness?: No  Psychologic Depression?: No Anxiety?: No  Physical Exam: BP 174/94 mmHg  Pulse 57  Ht _0   (1.702 m)  Wt 201 lb 14.4 oz (91.581 kg)  BMI 31.61 kg/m2  Constitutional: Well nourished. Alert and oriented, No acute distress. HEENT: [place] AT, moist mucus membranes. Trachea midline, no masses. Cardiovascular: No clubbing, cyanosis, or edema. Respiratory: Normal respiratory effort, no increased  work of breathing. GI: Abdomen is soft, non tender, non distended, no abdominal masses. Liver and spleen not palpable.  No hernias appreciated.  Stool sample for occult testing is not indicated.   GU: No CVA tenderness.  No bladder fullness or masses.  Patient with circumcised phallus.  Urethral meatus is patent.  No penile discharge. No penile lesions or rashes.  Glands floppy.  Cylinders in place.  Scrotum without lesions, cysts, rashes and/or edema.  Testicles are located scrotally bilaterally. Pump palpable high within the left hemiscrotum.  No masses are appreciated in the testicles. Left and right epididymis are normal. Rectal: Patient with  normal sphincter tone. Anus and perineum without scarring or rashes. No rectal masses are appreciated. Prostate is approximately 50 grams, no nodules are appreciated. Seminal vesicles are normal. Skin: No rashes, bruises or suspicious lesions. Lymph: No cervical or inguinal adenopathy. Neurologic: Grossly intact, no focal deficits, moving all 4 extremities. Psychiatric: Normal mood and affect.  Laboratory Data: Lab Results  Component Value Date   WBC 3.5 09/09/2014   HGB 11.7* 03/08/2014   HCT 37.4* 09/09/2014   MCV 98* 09/09/2014   PLT 138* 09/09/2014    Lab Results  Component Value Date   CREATININE 2.01* 09/09/2014    Lab Results  Component Value Date   TSH 0.316* 09/09/2014       Component Value Date/Time   CHOL 184 09/09/2014 1052   HDL 38* 09/09/2014 1052   CHOLHDL 4.8 09/09/2014 1052   LDLCALC 104* 09/09/2014 1052    Lab Results  Component Value Date   AST 20 09/09/2014   Lab Results  Component Value Date   ALT 15 09/09/2014    PSA History  5.6 ng/mL on 11/27/2013  4.9 ng/mL on 05/28/2014  0.7 ng/mL on 09/09/2014    Assessment & Plan:    1. Elevated PSA:   We discussed the nature, R/B of PSA screening, repeat TRUSPBx of prostate or continued surveillance. He elects to continue surveillance if PSA remains relatively stable. The issue is readdressed today and he elects continued surveillance. He will return in one year for repeat PSA.  2. Erectile dysfunction:   Patient is unsatisfied with his current penile prosthesis.  Dr. Alyson Ingles was available and he examined and spoke with the patient concerning a prosthesis revision.  He explains that an extender off one of the cylinders may have fallen off or the cylinders were not measured correctly at the time of placement, but this could be corrected.  He would also remove the pump portion of the prosthesis deeper into his scrotum.  If the patient desires revision, he will contact our office and we will make arrangements with Dr. Alyson Ingles for the surgery to be performed at the [facility].  Otherwise, he will return in 1 year for exam.  3. BPH with LUTS:   IPSS score is 5/1.  We will continue to monitor.  He will have his IPSS score, exam and PSA in 12 months.  - PSA   Return in about 1 year (around 06/02/2016) for IPSS score and exam.  These notes generated with voice recognition software. I apologize for typographical errors.  Zara Council, [facility] [address], [place],  [address] [phone]

## 2015-06-04 LAB — PSA: PROSTATE SPECIFIC AG, SERUM: 5.5 ng/mL — AB (ref 0.0–4.0)

## 2015-06-06 ENCOUNTER — Telehealth: Payer: Self-pay

## 2015-06-06 NOTE — Telephone Encounter (Signed)
Spoke with pt in reference to PSA and prothesis. Pt stated he needs to check with insurance to see how much they will cover. Pt will call back when he can. Pt voiced understanding of PSA and prothesis.

## 2015-06-06 NOTE — Telephone Encounter (Signed)
-----   Message from Nori Riis, PA-C sent at 06/04/2015 10:17 AM EDT ----- Patient's PSA is stable.   We will see him in one year.  If he decides to have a revision of his prothesis, he may contact the office.  Dr. Noah Delaine will do the revision, but not at [facility].   He will do the revision at [facility].

## 2015-06-07 DIAGNOSIS — N529 Male erectile dysfunction, unspecified: Secondary | ICD-10-CM | POA: Insufficient documentation

## 2015-06-07 DIAGNOSIS — R972 Elevated prostate specific antigen [PSA]: Secondary | ICD-10-CM | POA: Insufficient documentation

## 2015-06-09 ENCOUNTER — Telehealth: Payer: Self-pay | Admitting: Urology

## 2015-06-09 NOTE — Telephone Encounter (Signed)
Left message for Selita to call back to discuss how to get this sent to [facility].  michelle

## 2015-06-09 NOTE — Telephone Encounter (Signed)
Patient called and stated that he wanted to move forward with the penile implant.  Thanks [employer]

## 2015-06-09 NOTE — Telephone Encounter (Signed)
We need to notify Dr. Noland Fordyce office that he wants to move forward.  Dr. Alyson Ingles will only do the revision at [facility].

## 2015-06-10 NOTE — Telephone Encounter (Signed)
Spoke with [employer] and she asked me to fax all the [place] notes and demographics and she will take care of getting an order from [facility] and get this taken care of and she will contact the patient with the appointment for the surgery.  Thanks,  Sharyn Lull

## 2015-06-13 DIAGNOSIS — N183 Chronic kidney disease, stage 3 (moderate): Secondary | ICD-10-CM | POA: Diagnosis not present

## 2015-06-13 DIAGNOSIS — I10 Essential (primary) hypertension: Secondary | ICD-10-CM | POA: Diagnosis not present

## 2015-06-13 DIAGNOSIS — N2581 Secondary hyperparathyroidism of renal origin: Secondary | ICD-10-CM | POA: Diagnosis not present

## 2015-06-13 DIAGNOSIS — E233 Hypothalamic dysfunction, not elsewhere classified: Secondary | ICD-10-CM | POA: Diagnosis not present

## 2015-06-17 ENCOUNTER — Encounter: Payer: Self-pay | Admitting: Family Medicine

## 2015-06-25 ENCOUNTER — Other Ambulatory Visit: Payer: Self-pay | Admitting: Family Medicine

## 2015-07-01 ENCOUNTER — Encounter: Payer: Self-pay | Admitting: Family Medicine

## 2015-07-01 ENCOUNTER — Ambulatory Visit (INDEPENDENT_AMBULATORY_CARE_PROVIDER_SITE_OTHER): Payer: Medicare Other | Admitting: Family Medicine

## 2015-07-01 ENCOUNTER — Ambulatory Visit (INDEPENDENT_AMBULATORY_CARE_PROVIDER_SITE_OTHER): Payer: Medicare Other | Admitting: Urology

## 2015-07-01 VITALS — BP 153/74 | HR 54 | Ht 68.0 in | Wt 208.0 lb

## 2015-07-01 VITALS — BP 157/78 | HR 52 | Temp 97.9°F | Ht 67.3 in | Wt 206.0 lb

## 2015-07-01 DIAGNOSIS — N183 Chronic kidney disease, stage 3 unspecified: Secondary | ICD-10-CM

## 2015-07-01 DIAGNOSIS — N5201 Erectile dysfunction due to arterial insufficiency: Secondary | ICD-10-CM | POA: Diagnosis not present

## 2015-07-01 DIAGNOSIS — Z01818 Encounter for other preprocedural examination: Secondary | ICD-10-CM | POA: Diagnosis not present

## 2015-07-01 DIAGNOSIS — I10 Essential (primary) hypertension: Secondary | ICD-10-CM | POA: Diagnosis not present

## 2015-07-01 DIAGNOSIS — F3176 Bipolar disorder, in full remission, most recent episode depressed: Secondary | ICD-10-CM | POA: Diagnosis not present

## 2015-07-01 MED ORDER — QUETIAPINE FUMARATE 200 MG PO TABS: 200.0000 mg | ORAL_TABLET | Freq: Every day | ORAL | Status: DC

## 2015-07-01 MED ORDER — LITHIUM CARBONATE ER 450 MG PO TBCR: 450.0000 mg | EXTENDED_RELEASE_TABLET | Freq: Every day | ORAL | Status: DC

## 2015-07-01 NOTE — Progress Notes (Signed)
07/01/2015 11:04 AM   Luis Miller 02/25/1946 [id number]  Referring provider: Guadalupe Maple, MD [address] [place], [place] [address]  Chief Complaint  Patient presents with  . Erectile Dysfunction    discuss surgery    HPI: Luis Miller is a 70yo seen in followup for erectile dysfunction and currently has a malfunctioning IPP.  He had an IPP placed Nov 2014. Since then he has not been able to use it due to significant curvature and glans deformity.    PMH: Past Medical History  Diagnosis Date  . Diabetes insipidus ([place])   . 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[place])   . Bipolar affective ([place])   . Cancer ([place]) malignant melanoma  . Internal hemorrhoids   . Hyperlipidemia   . Hypertension   . Chronic kidney disease   . Apnea, sleep   . Impotence   . Secondary hyperparathyroidism of renal origin ([place])   . Erectile dysfunction   . Urinary hesitancy   . Skin cancer   . Urinary frequency   . Testicular hypofunction   . BPH (benign prostatic hyperplasia)   . IBS (irritable bowel syndrome)   . Anemia   . Crohn's disease ([place])   . Peyronie disease   . Paraphimosis     Surgical History: Past Surgical History  Procedure Laterality Date  . Vasectomy    . Tonsillectomy    . Bone marrow biopsy    . Penile prosthesis implant    . Colonoscopy with propofol N/A 12/23/2014    Procedure: COLONOSCOPY WITH PROPOFOL;  Surgeon: Manya Silvas, MD;  Location: [facility] ENDOSCOPY;  Service: Endoscopy;  Laterality: N/A;  . Melanoma excision    . Arm fracture      Home Medications:    Medication List       This list is accurate as of: 07/01/15 11:04 AM.  Always use your most recent med list.               aMILoride 5 MG tablet  Commonly known as:  MIDAMOR  TAKE 1 TABLET ONE TIME DAILY     amLODipine 10 MG tablet  Commonly known as:  NORVASC  TAKE 1 TABLET ONE TIME DAILY     benazepril 40 MG tablet  Commonly known as:  LOTENSIN  Take 40 mg by mouth daily.     carvedilol 6.25 MG tablet  Commonly known as:  COREG  Take 6.25 mg by mouth daily.     hydrALAZINE 100 MG tablet  Commonly known as:  APRESOLINE  Take 100 mg by mouth 3 (three) times daily.     lithium carbonate 450 MG CR tablet  Commonly known as:  ESKALITH  Take 1 tablet (450 mg total) by mouth daily.     multivitamin tablet  Take 1 tablet by mouth daily.     QUEtiapine 200 MG tablet  Commonly known as:  SEROQUEL  Take 1 tablet (200 mg total) by mouth at bedtime.     VITAMIN D (CHOLECALCIFEROL) PO  Take by mouth.        Allergies:  Allergies  Allergen Reactions  . Mirtazapine Rash  . Olanzapine Rash  . Strattera [Atomoxetine Hcl]     Urinary sx    Family History: Family History  Problem Relation Age of Onset  . Family history unknown: Yes    Social History:  reports that he quit smoking about 42 years ago. He has never used smokeless tobacco. He reports that he drinks  alcohol. He reports that he does not use illicit drugs.  ROS: UROLOGY Frequent Urination?: No Hard to postpone urination?: No Burning/pain with urination?: No Get up at night to urinate?: Yes Leakage of urine?: No Urine stream starts and stops?: No Trouble starting stream?: No Do you have to strain to urinate?: No Blood in urine?: No Urinary tract infection?: No Sexually transmitted disease?: No Injury to kidneys or bladder?: Yes Painful intercourse?: No Weak stream?: No Erection problems?: Yes Penile pain?: No  Gastrointestinal Nausea?: No Vomiting?: No Indigestion/heartburn?: No Diarrhea?: No Constipation?: No  Constitutional Fever: No Night sweats?: No Weight loss?: No Fatigue?: No  Skin Skin rash/lesions?: No Itching?: No  Eyes Blurred vision?: No Double vision?: No  Ears/Nose/Throat Sore throat?: No Sinus problems?: No  Hematologic/Lymphatic Swollen glands?: No Easy bruising?: Yes  Cardiovascular Leg swelling?: Yes Chest pain?: No  Respiratory Cough?:  No Shortness of breath?: No  Endocrine Excessive thirst?: No  Musculoskeletal Back pain?: No Joint pain?: Yes  Neurological Headaches?: No Dizziness?: No  Psychologic Depression?: No Anxiety?: No  Physical Exam: BP 153/74 mmHg  Pulse 54  Ht _0  (1.727 m)  Wt 94.348 kg (208 lb)  BMI 31.63 kg/m2  Constitutional:  Alert and oriented, No acute distress. HEENT: [place] AT, moist mucus membranes.  Trachea midline, no masses. Cardiovascular: No clubbing, cyanosis, or edema. Respiratory: Normal respiratory effort, no increased work of breathing. GI: Abdomen is soft, nontender, nondistended, no abdominal masses GU: No CVA tenderness. Left cylinder 3cm short of glans, right cylinder 0.5cm short of glans. Left curvature 45 degrees. Pump in left inguinal canal Skin: No rashes, bruises or suspicious lesions. Lymph: No cervical or inguinal adenopathy. Neurologic: Grossly intact, no focal deficits, moving all 4 extremities. Psychiatric: Normal mood and affect.  Laboratory Data: Lab Results  Component Value Date   WBC 3.5 09/09/2014   HGB 11.7* 03/08/2014   HCT 37.4* 09/09/2014   MCV 98* 09/09/2014   PLT 138* 09/09/2014    Lab Results  Component Value Date   CREATININE 2.01* 09/09/2014    No results found for: PSA  No results found for: TESTOSTERONE  No results found for: HGBA1C  Urinalysis    Component Value Date/Time   APPEARANCEUR Clear 09/09/2014 1053   GLUCOSEU Negative 09/09/2014 1053   BILIRUBINUR Negative 09/09/2014 1053   PROTEINUR Negative 09/09/2014 1053   NITRITE Negative 09/09/2014 1053   LEUKOCYTESUR Negative 09/09/2014 1053    Pertinent Imaging:   Assessment & Plan:    1. ED -Schedule for IPP revision, AMS  There are no diagnoses linked to this encounter.  No Follow-up on file.  Nicolette Bang, MD  [facility] [address], [place], [place] [address] [phone]

## 2015-07-01 NOTE — Progress Notes (Signed)
BP 157/78 mmHg  Pulse 52  Temp(Src) 97.9 F (36.6 C)  Ht 5' 7.3" (1.709 m)  Wt 206 lb (93.441 kg)  BMI 31.99 kg/m2  SpO2 99%   Subjective:    Patient ID: Luis Coss., male    DOB: Jul 28, 1945, 70 y.o.   MRN: [id number]  HPI: Luis Fok. is a 70 y.o. male  Chief Complaint  Patient presents with  . surgical clearance   She with stage III CK D which is been basically stable. No issues or concerns does take lithium and needs to keep taking lithium. Patient's nerves are stable on quetiapine and lithium which is taken long-term and is done well. Blood pressure good control no issues with medications except for some mild ankle edema which is controlled with support hose Taking amiloride without problems and drinks a lot of water as recommended by nephrology. No cardiovascular symptoms Relevant past medical, surgical, family and social history reviewed and updated as indicated. Interim medical history since our last visit reviewed. Allergies and medications reviewed and updated.  Review of Systems  Constitutional: Negative.   HENT: Negative.   Respiratory: Negative.   Cardiovascular: Negative.   Gastrointestinal: Negative.     Per HPI unless specifically indicated above     Objective:    BP 157/78 mmHg  Pulse 52  Temp(Src) 97.9 F (36.6 C)  Ht 5' 7.3" (1.709 m)  Wt 206 lb (93.441 kg)  BMI 31.99 kg/m2  SpO2 99%  Wt Readings from Last 3 Encounters:  07/01/15 206 lb (93.441 kg)  07/01/15 208 lb (94.348 kg)  06/03/15 201 lb 14.4 oz (91.581 kg)    Physical Exam  Constitutional: He is oriented to person, place, and time. He appears well-developed and well-nourished. No distress.  HENT:  Head: Normocephalic and atraumatic.  Right Ear: Hearing normal.  Left Ear: Hearing normal.  Nose: Nose normal.  Mouth/Throat: Oropharynx is clear and moist.  Eyes: Conjunctivae and lids are normal. Pupils are equal, round, and reactive to light. Right eye exhibits no  discharge. Left eye exhibits no discharge. No scleral icterus.  Neck: Normal range of motion. Neck supple. No tracheal deviation present. No thyromegaly present.  Cardiovascular: Normal rate, regular rhythm and normal heart sounds.   Pulmonary/Chest: Effort normal and breath sounds normal. No respiratory distress. He has no wheezes. He has no rales.  Abdominal: Soft. Bowel sounds are normal. He exhibits no distension. There is no tenderness.  Musculoskeletal: Normal range of motion.  Neurological: He is alert and oriented to person, place, and time.  Skin: Skin is warm, dry and intact. No rash noted.  Psychiatric: He has a normal mood and affect. His speech is normal and behavior is normal. Judgment and thought content normal. Cognition and memory are normal.    Results for orders placed or performed in visit on 06/03/15  PSA  Result Value Ref Range   Prostate Specific Ag, Serum 5.5 (H) 0.0 - 4.0 ng/mL      Assessment & Plan:   Problem List Items Addressed This Visit      Cardiovascular and Mediastinum   Hypertension - Primary (Chronic)    The current medical regimen is effective;  continue present plan and medications.         Genitourinary   CKD (chronic kidney disease) stage 3, GFR 30-59 ml/min    The current medical regimen is effective;  continue present plan and medications.         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        Other Visit Diagnoses    Preoperative clearance        Patient is cleared for surgery. Precautions about chronic kidney disease are recommended.        Follow up plan: No Follow-up on file.

## 2015-07-01 NOTE — Assessment & Plan Note (Signed)
The current medical regimen is effective;  continue present plan and medications.  

## 2015-07-07 ENCOUNTER — Other Ambulatory Visit: Payer: Self-pay | Admitting: Urology

## 2015-07-08 DIAGNOSIS — N5201 Erectile dysfunction due to arterial insufficiency: Secondary | ICD-10-CM | POA: Insufficient documentation

## 2015-07-21 DIAGNOSIS — L281 Prurigo nodularis: Secondary | ICD-10-CM | POA: Diagnosis not present

## 2015-07-21 DIAGNOSIS — D485 Neoplasm of uncertain behavior of skin: Secondary | ICD-10-CM | POA: Diagnosis not present

## 2015-08-19 ENCOUNTER — Encounter (HOSPITAL_COMMUNITY)
Admission: RE | Admit: 2015-08-19 | Discharge: 2015-08-19 | Disposition: A | Discharge status: Home / Self Care | Payer: Medicare Other | Source: Ambulatory Visit | Attending: Urology | Admitting: Urology

## 2015-08-19 ENCOUNTER — Encounter (HOSPITAL_COMMUNITY): Payer: Self-pay

## 2015-08-19 DIAGNOSIS — R9431 Abnormal electrocardiogram [ECG] [EKG]: Secondary | ICD-10-CM | POA: Diagnosis not present

## 2015-08-19 DIAGNOSIS — N529 Male erectile dysfunction, unspecified: Secondary | ICD-10-CM | POA: Diagnosis not present

## 2015-08-19 DIAGNOSIS — Z01810 Encounter for preprocedural cardiovascular examination: Secondary | ICD-10-CM | POA: Diagnosis not present

## 2015-08-19 DIAGNOSIS — Z01812 Encounter for preprocedural laboratory examination: Secondary | ICD-10-CM | POA: Diagnosis not present

## 2015-08-19 LAB — CBC
HCT: 37.3 % — ABNORMAL LOW (ref 39.0–52.0)
HEMOGLOBIN: 12.7 g/dL — AB (ref 13.0–17.0)
MCH: 33.5 pg (ref 26.0–34.0)
MCHC: 34 g/dL (ref 30.0–36.0)
MCV: 98.4 fL (ref 78.0–100.0)
PLATELETS: 137 10*3/uL — AB (ref 150–400)
RBC: 3.79 MIL/uL — AB (ref 4.22–5.81)
RDW: 13.3 % (ref 11.5–15.5)
WBC: 3.9 10*3/uL — AB (ref 4.0–10.5)

## 2015-08-19 LAB — BASIC METABOLIC PANEL
ANION GAP: 3 — AB (ref 5–15)
BUN: 31 mg/dL — ABNORMAL HIGH (ref 6–20)
CALCIUM: 9.5 mg/dL (ref 8.9–10.3)
CHLORIDE: 115 mmol/L — AB (ref 101–111)
CO2: 25 mmol/L (ref 22–32)
CREATININE: 2.14 mg/dL — AB (ref 0.61–1.24)
GFR calc non Af Amer: 30 mL/min — ABNORMAL LOW (ref >60)
GFR, EST AFRICAN AMERICAN: 34 mL/min — AB (ref >60)
Glucose, Bld: 93 mg/dL (ref 65–99)
Potassium: 3.9 mmol/L (ref 3.5–5.1)
SODIUM: 143 mmol/L (ref 135–145)

## 2015-08-19 NOTE — Pre-Procedure Instructions (Signed)
CBC and BMP results routed to Dr. Alyson Ingles

## 2015-08-19 NOTE — Patient Instructions (Signed)
[name]  08/19/2015   Your procedure is scheduled on: 08/25/15  Report to [facility]  Entrance take [facility]  elevators to 3rd floor to  [place] at 0930 am  Call this number if you have problems the morning of surgery [phone]   Remember: ONLY 1 PERSON MAY GO WITH YOU TO SHORT STAY TO GET  READY MORNING OF [place].  Do not eat food or drink liquids :After Midnight Sunday     Take these medicines the morning of surgery with A SIP OF WATER: AMLODOPINE, CARVEDILOL, HYDRALAZINE, LITHIUM                                You may not have any metal on your body and              piercings  Do not wear jewelry, lotions, deodorant                         Men may shave face and neck.   Do not bring valuables to the hospital. [place].  Contacts, dentures or bridgework may not be worn into surgery.  Leave suitcase in the car. After surgery it may be brought to your room._____________________________________________________________________             [facility] - Preparing for Surgery Before surgery, you can play an important role.  Because skin is not sterile, your skin needs to be as free of germs as possible.  You can reduce the number of germs on your skin by washing with CHG (chlorahexidine gluconate) soap before surgery.  CHG is an antiseptic cleaner which kills germs and bonds with the skin to continue killing germs even after washing. Please DO NOT use if you have an allergy to CHG or antibacterial soaps.  If your skin becomes reddened/irritated stop using the CHG and inform your nurse when you arrive at Short Stay. Do not shave (including legs and underarms) for at least 48 hours prior to the first CHG shower.  You may shave your face/neck. Please follow these instructions carefully:  1.  Shower with CHG Soap the night before surgery and the  morning of Surgery.  2.  If you choose to  wash your hair, wash your hair first as usual with your  normal  shampoo.  3.  After you shampoo, rinse your hair and body thoroughly to remove the  shampoo.                           4.  Use CHG as you would any other liquid soap.  You can apply chg directly  to the skin and wash                       Gently with a scrungie or clean washcloth.  5.  Apply the CHG Soap to your body ONLY FROM THE NECK DOWN.   Do not use on face/ open                           Wound or open sores.  Avoid contact with eyes, ears mouth and genitals (private parts).                       Wash face,  Genitals (private parts) with your normal soap.             6.  Wash thoroughly, paying special attention to the area where your surgery  will be performed.  7.  Thoroughly rinse your body with warm water from the neck down.  8.  DO NOT shower/wash with your normal soap after using and rinsing off  the CHG Soap.                9.  Pat yourself dry with a clean towel.            10.  Wear clean pajamas.            11.  Place clean sheets on your bed the night of your first shower and do not  sleep with pets. Day of Surgery : Do not apply any lotions/deodorants the morning of surgery.  Please wear clean clothes to the hospital/surgery center.  FAILURE TO FOLLOW THESE INSTRUCTIONS MAY RESULT IN THE CANCELLATION OF YOUR SURGERY PATIENT SIGNATURE_________________________________  NURSE SIGNATURE__________________________________  ________________________________________________________________________

## 2015-08-20 ENCOUNTER — Other Ambulatory Visit (HOSPITAL_COMMUNITY): Payer: Medicare Other

## 2015-08-25 ENCOUNTER — Encounter (HOSPITAL_COMMUNITY): Payer: Self-pay | Admitting: *Deleted

## 2015-08-25 ENCOUNTER — Ambulatory Visit (HOSPITAL_COMMUNITY): Payer: Medicare Other | Admitting: Anesthesiology

## 2015-08-25 ENCOUNTER — Encounter (HOSPITAL_COMMUNITY)
Admission: RE | Disposition: A | Discharge status: Home / Self Care | Payer: Self-pay | Source: Ambulatory Visit | Attending: Urology

## 2015-08-25 ENCOUNTER — Observation Stay (HOSPITAL_COMMUNITY)
Admission: RE | Admit: 2015-08-25 | Discharge: 2015-08-26 | Disposition: A | Discharge status: Home / Self Care | Payer: Medicare Other | Source: Ambulatory Visit | Attending: Urology | Admitting: Urology

## 2015-08-25 DIAGNOSIS — Z79899 Other long term (current) drug therapy: Secondary | ICD-10-CM | POA: Diagnosis not present

## 2015-08-25 DIAGNOSIS — N183 Chronic kidney disease, stage 3 (moderate): Secondary | ICD-10-CM | POA: Diagnosis not present

## 2015-08-25 DIAGNOSIS — N529 Male erectile dysfunction, unspecified: Secondary | ICD-10-CM | POA: Diagnosis not present

## 2015-08-25 DIAGNOSIS — F319 Bipolar disorder, unspecified: Secondary | ICD-10-CM | POA: Diagnosis not present

## 2015-08-25 DIAGNOSIS — Y838 Other surgical procedures as the cause of abnormal reaction of the patient, or of later complication, without mention of misadventure at the time of the procedure: Secondary | ICD-10-CM | POA: Insufficient documentation

## 2015-08-25 DIAGNOSIS — T83420A Displacement of penile (implanted) prosthesis, initial encounter: Secondary | ICD-10-CM | POA: Diagnosis not present

## 2015-08-25 DIAGNOSIS — E232 Diabetes insipidus: Secondary | ICD-10-CM | POA: Insufficient documentation

## 2015-08-25 DIAGNOSIS — I129 Hypertensive chronic kidney disease with stage 1 through stage 4 chronic kidney disease, or unspecified chronic kidney disease: Secondary | ICD-10-CM | POA: Insufficient documentation

## 2015-08-25 DIAGNOSIS — Z87891 Personal history of nicotine dependence: Secondary | ICD-10-CM | POA: Insufficient documentation

## 2015-08-25 DIAGNOSIS — G4730 Sleep apnea, unspecified: Secondary | ICD-10-CM | POA: Insufficient documentation

## 2015-08-25 DIAGNOSIS — N189 Chronic kidney disease, unspecified: Secondary | ICD-10-CM | POA: Insufficient documentation

## 2015-08-25 DIAGNOSIS — T8389XA Other specified complication of genitourinary prosthetic devices, implants and grafts, initial encounter: Secondary | ICD-10-CM | POA: Diagnosis not present

## 2015-08-25 DIAGNOSIS — D649 Anemia, unspecified: Secondary | ICD-10-CM | POA: Diagnosis not present

## 2015-08-25 DIAGNOSIS — T83490A Other mechanical complication of penile (implanted) prosthesis, initial encounter: Secondary | ICD-10-CM | POA: Diagnosis not present

## 2015-08-25 HISTORY — PX: PENILE PROSTHESIS IMPLANT: SHX240

## 2015-08-25 SURGERY — INSERTION, PENILE PROSTHESIS, INFLATABLE
Anesthesia: General

## 2015-08-25 MED ORDER — HYDROMORPHONE HCL 1 MG/ML IJ SOLN
INTRAMUSCULAR | Status: AC
  Filled 2015-08-25: qty 1

## 2015-08-25 MED ORDER — ATROPINE SULFATE 0.4 MG/ML IJ SOLN
INTRAMUSCULAR | Status: AC
  Filled 2015-08-25: qty 1

## 2015-08-25 MED ORDER — ROCURONIUM BROMIDE 100 MG/10ML IV SOLN
INTRAVENOUS | Status: DC | PRN
  Administered 2015-08-25 (×2): 10 mg via INTRAVENOUS
  Administered 2015-08-25: 50 mg via INTRAVENOUS
  Administered 2015-08-25: 20 mg via INTRAVENOUS

## 2015-08-25 MED ORDER — SODIUM CHLORIDE 0.9% FLUSH: 3.0000 mL | INTRAVENOUS | Status: DC | PRN

## 2015-08-25 MED ORDER — GENTAMICIN SULFATE 40 MG/ML IJ SOLN
380.0000 mg | Freq: Once | INTRAVENOUS | Status: AC
  Administered 2015-08-25: 380 mg via INTRAVENOUS
  Filled 2015-08-25: qty 9.5

## 2015-08-25 MED ORDER — ATROPINE SULFATE 0.4 MG/ML IJ SOLN
INTRAMUSCULAR | Status: DC | PRN
  Administered 2015-08-25: 0.4 mg via INTRAVENOUS

## 2015-08-25 MED ORDER — MIDAZOLAM HCL 2 MG/2ML IJ SOLN
INTRAMUSCULAR | Status: DC | PRN
  Administered 2015-08-25: 2 mg via INTRAVENOUS

## 2015-08-25 MED ORDER — SENNOSIDES-DOCUSATE SODIUM 8.6-50 MG PO TABS
2.0000 | ORAL_TABLET | Freq: Every day | ORAL | Status: DC
  Administered 2015-08-25: 2 via ORAL
  Filled 2015-08-25: qty 2

## 2015-08-25 MED ORDER — LORATADINE 10 MG PO TABS
10.0000 mg | ORAL_TABLET | Freq: Every day | ORAL | Status: DC
  Administered 2015-08-25: 10 mg via ORAL
  Filled 2015-08-25: qty 1

## 2015-08-25 MED ORDER — ACETAMINOPHEN 500 MG PO TABS
1000.0000 mg | ORAL_TABLET | Freq: Four times a day (QID) | ORAL | Status: DC
  Administered 2015-08-25 – 2015-08-26 (×3): 1000 mg via ORAL
  Filled 2015-08-25 (×3): qty 2

## 2015-08-25 MED ORDER — KETAMINE HCL 10 MG/ML IJ SOLN
INTRAMUSCULAR | Status: DC | PRN
  Administered 2015-08-25 (×3): 10 mg via INTRAVENOUS
  Administered 2015-08-25: 30 mg via INTRAVENOUS
  Administered 2015-08-25: 10 mg via INTRAVENOUS
  Administered 2015-08-25: 20 mg via INTRAVENOUS

## 2015-08-25 MED ORDER — KETOROLAC TROMETHAMINE 30 MG/ML IJ SOLN: 15.0000 mg | Freq: Once | INTRAMUSCULAR | Status: DC | PRN

## 2015-08-25 MED ORDER — SODIUM CHLORIDE 0.9 % IJ SOLN
INTRAMUSCULAR | Status: AC
  Filled 2015-08-25: qty 10

## 2015-08-25 MED ORDER — GENTAMICIN SULFATE 40 MG/ML IJ SOLN
1.5000 mg/kg | INTRAMUSCULAR | Status: AC
  Administered 2015-08-25: 140 mg via INTRAVENOUS
  Filled 2015-08-25: qty 3.5

## 2015-08-25 MED ORDER — DIPHENHYDRAMINE HCL 12.5 MG/5ML PO ELIX: 12.5000 mg | ORAL_SOLUTION | Freq: Four times a day (QID) | ORAL | Status: DC | PRN

## 2015-08-25 MED ORDER — LACTATED RINGERS IV SOLN: INTRAVENOUS | Status: DC

## 2015-08-25 MED ORDER — BACITRACIN-NEOMYCIN-POLYMYXIN 400-5-5000 EX OINT: 1.0000 "application " | TOPICAL_OINTMENT | Freq: Three times a day (TID) | CUTANEOUS | Status: DC | PRN

## 2015-08-25 MED ORDER — QUETIAPINE FUMARATE 100 MG PO TABS
200.0000 mg | ORAL_TABLET | Freq: Every day | ORAL | Status: DC
  Administered 2015-08-25: 200 mg via ORAL
  Filled 2015-08-25: qty 2

## 2015-08-25 MED ORDER — SUGAMMADEX SODIUM 200 MG/2ML IV SOLN
INTRAVENOUS | Status: DC | PRN
  Administered 2015-08-25: 180 mg via INTRAVENOUS

## 2015-08-25 MED ORDER — HYDROMORPHONE HCL 1 MG/ML IJ SOLN
INTRAMUSCULAR | Status: DC | PRN
  Administered 2015-08-25: .4 mg via INTRAVENOUS

## 2015-08-25 MED ORDER — SODIUM CHLORIDE 0.9 % IR SOLN
Status: DC | PRN
  Administered 2015-08-25: 500 mL

## 2015-08-25 MED ORDER — EPHEDRINE SULFATE 50 MG/ML IJ SOLN
INTRAMUSCULAR | Status: DC | PRN
  Administered 2015-08-25 (×2): 10 mg via INTRAVENOUS
  Administered 2015-08-25: 5 mg via INTRAVENOUS

## 2015-08-25 MED ORDER — MORPHINE SULFATE (PF) 2 MG/ML IV SOLN: 2.0000 mg | INTRAVENOUS | Status: DC | PRN

## 2015-08-25 MED ORDER — KETAMINE HCL 10 MG/ML IJ SOLN
INTRAMUSCULAR | Status: AC
  Filled 2015-08-25: qty 1

## 2015-08-25 MED ORDER — DEXAMETHASONE SODIUM PHOSPHATE 10 MG/ML IJ SOLN
INTRAMUSCULAR | Status: DC | PRN
  Administered 2015-08-25: 10 mg via INTRAVENOUS

## 2015-08-25 MED ORDER — SODIUM CHLORIDE 0.9 % IR SOLN
Status: AC
  Filled 2015-08-25 (×2): qty 1

## 2015-08-25 MED ORDER — ONDANSETRON HCL 4 MG/2ML IJ SOLN
INTRAMUSCULAR | Status: DC | PRN
  Administered 2015-08-25: 4 mg via INTRAVENOUS

## 2015-08-25 MED ORDER — DEXAMETHASONE SODIUM PHOSPHATE 10 MG/ML IJ SOLN
INTRAMUSCULAR | Status: AC
  Filled 2015-08-25: qty 1

## 2015-08-25 MED ORDER — BACITRACIN ZINC 500 UNIT/GM EX OINT
TOPICAL_OINTMENT | CUTANEOUS | Status: AC
  Filled 2015-08-25: qty 28.35

## 2015-08-25 MED ORDER — AMILORIDE HCL 5 MG PO TABS
5.0000 mg | ORAL_TABLET | Freq: Every day | ORAL | Status: DC
  Administered 2015-08-26: 5 mg via ORAL
  Filled 2015-08-25 (×2): qty 1

## 2015-08-25 MED ORDER — POLYETHYLENE GLYCOL 3350 17 G PO PACK: 17.0000 g | PACK | Freq: Every day | ORAL | Status: DC | PRN

## 2015-08-25 MED ORDER — LIDOCAINE HCL (CARDIAC) 20 MG/ML IV SOLN
INTRAVENOUS | Status: AC
  Filled 2015-08-25: qty 5

## 2015-08-25 MED ORDER — SUCCINYLCHOLINE CHLORIDE 20 MG/ML IJ SOLN
INTRAMUSCULAR | Status: DC | PRN
  Administered 2015-08-25: 100 mg via INTRAVENOUS

## 2015-08-25 MED ORDER — AMLODIPINE BESYLATE 10 MG PO TABS
10.0000 mg | ORAL_TABLET | Freq: Every day | ORAL | Status: DC
  Administered 2015-08-26: 10 mg via ORAL
  Filled 2015-08-25: qty 1

## 2015-08-25 MED ORDER — LACTATED RINGERS IV SOLN
INTRAVENOUS | Status: DC
  Administered 2015-08-25 (×2): via INTRAVENOUS

## 2015-08-25 MED ORDER — SUFENTANIL CITRATE 50 MCG/ML IV SOLN
INTRAVENOUS | Status: AC
Start: 2015-08-25 — End: 2015-08-25
  Filled 2015-08-25: qty 1

## 2015-08-25 MED ORDER — HYDROMORPHONE HCL 1 MG/ML IJ SOLN: 0.2500 mg | INTRAMUSCULAR | Status: DC | PRN

## 2015-08-25 MED ORDER — BUPIVACAINE-EPINEPHRINE (PF) 0.5% -1:200000 IJ SOLN
INTRAMUSCULAR | Status: AC
  Filled 2015-08-25: qty 30

## 2015-08-25 MED ORDER — BENAZEPRIL HCL 10 MG PO TABS
40.0000 mg | ORAL_TABLET | Freq: Every day | ORAL | Status: DC
  Administered 2015-08-26: 40 mg via ORAL
  Filled 2015-08-25: qty 4

## 2015-08-25 MED ORDER — PROMETHAZINE HCL 25 MG/ML IJ SOLN: 6.2500 mg | INTRAMUSCULAR | Status: DC | PRN

## 2015-08-25 MED ORDER — HYDROMORPHONE HCL 2 MG/ML IJ SOLN
INTRAMUSCULAR | Status: AC
  Filled 2015-08-25: qty 1

## 2015-08-25 MED ORDER — LIDOCAINE HCL (CARDIAC) 20 MG/ML IV SOLN
INTRAVENOUS | Status: DC | PRN
  Administered 2015-08-25: 100 mg via INTRAVENOUS

## 2015-08-25 MED ORDER — CARVEDILOL 6.25 MG PO TABS
6.2500 mg | ORAL_TABLET | Freq: Two times a day (BID) | ORAL | Status: DC
  Administered 2015-08-26: 6.25 mg via ORAL
  Filled 2015-08-25: qty 1

## 2015-08-25 MED ORDER — ROCURONIUM BROMIDE 100 MG/10ML IV SOLN
INTRAVENOUS | Status: AC
  Filled 2015-08-25: qty 1

## 2015-08-25 MED ORDER — HYDROMORPHONE HCL 1 MG/ML IJ SOLN
0.2500 mg | INTRAMUSCULAR | Status: DC | PRN
  Administered 2015-08-25 (×2): 0.5 mg via INTRAVENOUS

## 2015-08-25 MED ORDER — VANCOMYCIN HCL IN DEXTROSE 1-5 GM/200ML-% IV SOLN
1000.0000 mg | INTRAVENOUS | Status: AC
  Administered 2015-08-25: 1000 mg via INTRAVENOUS
  Filled 2015-08-25: qty 200

## 2015-08-25 MED ORDER — DIPHENHYDRAMINE HCL 50 MG/ML IJ SOLN: 12.5000 mg | Freq: Four times a day (QID) | INTRAMUSCULAR | Status: DC | PRN

## 2015-08-25 MED ORDER — LITHIUM CARBONATE ER 450 MG PO TBCR
450.0000 mg | EXTENDED_RELEASE_TABLET | Freq: Every day | ORAL | Status: DC
  Administered 2015-08-26: 450 mg via ORAL
  Filled 2015-08-25: qty 1

## 2015-08-25 MED ORDER — ONDANSETRON HCL 4 MG/2ML IJ SOLN
INTRAMUSCULAR | Status: AC
  Filled 2015-08-25: qty 2

## 2015-08-25 MED ORDER — SODIUM CHLORIDE 0.9% FLUSH
3.0000 mL | Freq: Two times a day (BID) | INTRAVENOUS | Status: DC
  Administered 2015-08-26: 3 mL via INTRAVENOUS

## 2015-08-25 MED ORDER — OXYCODONE HCL 5 MG PO TABS
5.0000 mg | ORAL_TABLET | ORAL | Status: DC | PRN
  Administered 2015-08-26: 10 mg via ORAL
  Filled 2015-08-25: qty 2

## 2015-08-25 MED ORDER — HYDRALAZINE HCL 50 MG PO TABS
100.0000 mg | ORAL_TABLET | Freq: Three times a day (TID) | ORAL | Status: DC
  Administered 2015-08-25 – 2015-08-26 (×2): 100 mg via ORAL
  Filled 2015-08-25 (×2): qty 2

## 2015-08-25 MED ORDER — PROPOFOL 10 MG/ML IV BOLUS
INTRAVENOUS | Status: DC | PRN
Start: 2015-08-25 — End: 2015-08-25
  Administered 2015-08-25: 160 mg via INTRAVENOUS

## 2015-08-25 MED ORDER — EPHEDRINE SULFATE 50 MG/ML IJ SOLN
INTRAMUSCULAR | Status: AC
  Filled 2015-08-25: qty 1

## 2015-08-25 MED ORDER — DEXTROSE IN LACTATED RINGERS 5 % IV SOLN
INTRAVENOUS | Status: DC
  Administered 2015-08-25 (×2): via INTRAVENOUS

## 2015-08-25 MED ORDER — MIDAZOLAM HCL 2 MG/2ML IJ SOLN
INTRAMUSCULAR | Status: AC
  Filled 2015-08-25: qty 2

## 2015-08-25 MED ORDER — VANCOMYCIN HCL IN DEXTROSE 1-5 GM/200ML-% IV SOLN
1000.0000 mg | Freq: Two times a day (BID) | INTRAVENOUS | Status: DC
  Administered 2015-08-25 – 2015-08-26 (×2): 1000 mg via INTRAVENOUS
  Filled 2015-08-25 (×2): qty 200

## 2015-08-25 MED ORDER — SODIUM CHLORIDE 0.9 % IV SOLN: 250.0000 mL | INTRAVENOUS | Status: DC | PRN

## 2015-08-25 MED ORDER — PROPOFOL 10 MG/ML IV BOLUS
INTRAVENOUS | Status: AC
  Filled 2015-08-25: qty 20

## 2015-08-25 MED ORDER — SUFENTANIL CITRATE 50 MCG/ML IV SOLN
INTRAVENOUS | Status: DC | PRN
  Administered 2015-08-25 (×3): 10 ug via INTRAVENOUS
  Administered 2015-08-25: 20 ug via INTRAVENOUS

## 2015-08-25 MED ORDER — ONDANSETRON HCL 4 MG/2ML IJ SOLN: 4.0000 mg | INTRAMUSCULAR | Status: DC | PRN

## 2015-08-25 SURGICAL SUPPLY — 55 items
1.5cm RTE ×3 IMPLANT
4.0 cm RTE ×3 IMPLANT
5.0 cm RTE ×3 IMPLANT
BAG URINE DRAINAGE (UROLOGICAL SUPPLIES) ×3 IMPLANT
BANDAGE COBAN STERILE 2 (GAUZE/BANDAGES/DRESSINGS) IMPLANT
BENZOIN TINCTURE PRP APPL 2/3 (GAUZE/BANDAGES/DRESSINGS) IMPLANT
BLADE HEX COATED 2.75 (ELECTRODE) ×3 IMPLANT
BLADE SURG 15 STRL LF DISP TIS (BLADE) ×2 IMPLANT
BLADE SURG 15 STRL SS (BLADE) ×4
BNDG GAUZE ELAST 4 BULKY (GAUZE/BANDAGES/DRESSINGS) ×3 IMPLANT
CATH FOLEY 2WAY SLVR  5CC 16FR (CATHETERS) ×2
CATH FOLEY 2WAY SLVR 5CC 16FR (CATHETERS) ×1 IMPLANT
CHLORAPREP W/TINT 26ML (MISCELLANEOUS) ×6 IMPLANT
CLOSURE WOUND 1/2 X4 (GAUZE/BANDAGES/DRESSINGS) ×1
COVER MAYO STAND STRL (DRAPES) ×3 IMPLANT
COVER SURGICAL LIGHT HANDLE (MISCELLANEOUS) ×3 IMPLANT
DISSECTOR ROUND CHERRY 3/8 STR (MISCELLANEOUS) IMPLANT
DRAPE EXTREMITY T 121X128X90 (DRAPE) IMPLANT
DRAPE INCISE IOBAN 66X45 STRL (DRAPES) ×3 IMPLANT
DRAPE LAPAROTOMY T 98X78 PEDS (DRAPES) ×3 IMPLANT
DRSG TEGADERM 4X4.75 (GAUZE/BANDAGES/DRESSINGS) IMPLANT
ELECT REM PT RETURN 9FT ADLT (ELECTROSURGICAL) ×3
ELECTRODE REM PT RTRN 9FT ADLT (ELECTROSURGICAL) ×1 IMPLANT
GAUZE SPONGE 4X4 12PLY STRL (GAUZE/BANDAGES/DRESSINGS) IMPLANT
GLOVE BIOGEL M 8.0 STRL (GLOVE) ×6 IMPLANT
GOWN STRL REUS W/TWL XL LVL3 (GOWN DISPOSABLE) ×3 IMPLANT
KIT ACCESSORY AMS 700 PUMP (UROLOGICAL SUPPLIES) ×3 IMPLANT
KIT BASIN OR (CUSTOM PROCEDURE TRAY) ×3 IMPLANT
LIQUID BAND (GAUZE/BANDAGES/DRESSINGS) ×3 IMPLANT
NEEDLE HYPO 22GX1.5 SAFETY (NEEDLE) ×3 IMPLANT
NS IRRIG 1000ML POUR BTL (IV SOLUTION) IMPLANT
PACK GENERAL/GYN (CUSTOM PROCEDURE TRAY) ×3 IMPLANT
PLUG CATH AND CAP STER (CATHETERS) ×3 IMPLANT
PUMP PRECONNECT MS 15CM IZ (Miscellaneous) ×3 IMPLANT
RESERVOIR 65ML PENILE PROS (Urological Implant) ×3 IMPLANT
RETRACTOR DEEP SCROTAL PENILE (Miscellaneous) ×3 IMPLANT
RETRACTOR WILSON SYSTEM (INSTRUMENTS) IMPLANT
SCRUB PCMX 4 OZ (MISCELLANEOUS) IMPLANT
SPONGE LAP 4X18 X RAY DECT (DISPOSABLE) ×3 IMPLANT
STRIP CLOSURE SKIN 1/2X4 (GAUZE/BANDAGES/DRESSINGS) ×2 IMPLANT
SUPPORT SCROTAL LG STRP (MISCELLANEOUS) ×2 IMPLANT
SUPPORTER ATHLETIC LG (MISCELLANEOUS) ×1
SURGILUBE 3G PEEL PACK STRL (MISCELLANEOUS) IMPLANT
SUT CHROMIC 3 0 SH 27 (SUTURE) IMPLANT
SUT MNCRL AB 4-0 PS2 18 (SUTURE) IMPLANT
SUT PDS AB 2-0 CT2 27 (SUTURE) IMPLANT
SUT VIC AB 2-0 UR6 27 (SUTURE) ×15 IMPLANT
SUT VIC AB 3-0 SH 27 (SUTURE) ×6
SUT VIC AB 3-0 SH 27XBRD (SUTURE) ×3 IMPLANT
SYR 20CC LL (SYRINGE) ×3 IMPLANT
SYR 50ML LL SCALE MARK (SYRINGE) ×3 IMPLANT
SYR CONTROL 10ML LL (SYRINGE) ×3 IMPLANT
TOWEL OR 17X26 10 PK STRL BLUE (TOWEL DISPOSABLE) ×3 IMPLANT
TOWEL OR NON WOVEN STRL DISP B (DISPOSABLE) ×3 IMPLANT
WATER STERILE IRR 500ML POUR (IV SOLUTION) IMPLANT

## 2015-08-25 NOTE — Anesthesia Postprocedure Evaluation (Signed)
Anesthesia Post Note  Patient: Luis Miller.  Procedure(s) Performed: Procedure(s) (LRB): REPLACEMENT OF INFLATABLE PENILE PROSTHESIS COMPONENTS (N/A)  Patient location during evaluation: PACU Anesthesia Type: General Level of consciousness: awake and alert Pain management: pain level controlled Vital Signs Assessment: post-procedure vital signs reviewed and stable Respiratory status: spontaneous breathing, nonlabored ventilation, respiratory function stable and patient connected to nasal cannula oxygen Cardiovascular status: blood pressure returned to baseline and stable Postop Assessment: no signs of nausea or vomiting Anesthetic complications: no    Last Vitals:  Filed Vitals:   08/25/15 1515 08/25/15 1530  BP: 147/88 138/83  Pulse: 58 56  Temp:    Resp: 21 13    Last Pain:  Filed Vitals:   08/25/15 1532  PainSc: 4                  Selia Wareing S

## 2015-08-25 NOTE — H&P (Signed)
Urology Admission H&P  Chief Complaint: penile pain  History of Present Illness: [name] is a 70yo with a hx of ED s/p IPP placement. 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 Diabetes insipidus ([place])   . 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 Bipolar affective ([place])   . Cancer ([place]) malignant melanoma  . Internal hemorrhoids   . Hyperlipidemia   . Hypertension   . Chronic kidney disease   . Apnea, sleep   . Impotence   . Secondary hyperparathyroidism of renal origin ([place])   . Erectile dysfunction   . Urinary hesitancy   . Skin cancer   . Urinary frequency   . Testicular hypofunction   . BPH (benign prostatic hyperplasia)   . IBS (irritable bowel syndrome)   . Anemia   . Crohn's disease ([place])   . Peyronie disease   . Paraphimosis    Past Surgical History  Procedure Laterality Date  . Vasectomy    . Tonsillectomy    . Bone marrow biopsy    . Penile prosthesis implant    . Colonoscopy with propofol N/A 12/23/2014    Procedure: COLONOSCOPY WITH PROPOFOL;  Surgeon: Manya Silvas, MD;  Location: [facility] ENDOSCOPY;  Service: Endoscopy;  Laterality: N/A;  . Melanoma excision    . Arm fracture      Home Medications:  Prescriptions prior to admission  Medication Sig Dispense Refill Last Dose  . aMILoride (MIDAMOR) 5 MG tablet TAKE 1 TABLET ONE TIME DAILY 90 tablet 1 08/24/2015 at Unknown time  . amLODipine (NORVASC) 10 MG tablet TAKE 1 TABLET ONE TIME DAILY 90 tablet 1 08/25/2015 at 0700  . benazepril (LOTENSIN) 40 MG tablet Take 40 mg by mouth daily.    08/24/2015 at Unknown time  . carvedilol (COREG) 6.25 MG tablet Take 6.25 mg by mouth 2 (two) times daily with a meal.    08/25/2015 at 0700  . cetirizine (ZYRTEC) 10 MG tablet Take 10 mg by mouth at bedtime.   08/24/2015 at 2200  . hydrALAZINE (APRESOLINE) 100 MG tablet Take 100 mg by mouth 3 (three) times daily.   08/25/2015 at  0700  . lithium carbonate (ESKALITH) 450 MG CR tablet Take 1 tablet (450 mg total) by mouth daily. 90 tablet 1 08/25/2015 at 0700  . Multiple Vitamin (MULTIVITAMIN) tablet Take 1 tablet by mouth daily.   Past Month at Unknown time  . QUEtiapine (SEROQUEL) 200 MG tablet Take 1 tablet (200 mg total) by mouth at bedtime. 90 tablet 1 08/24/2015 at 2200  . VITAMIN D, CHOLECALCIFEROL, PO Take 1 tablet by mouth daily.    Past Month at Unknown time   Allergies:  Allergies  Allergen Reactions  . Mirtazapine Rash  . Olanzapine Rash  . Strattera [Atomoxetine Hcl] Other (See Comments)    Urinary sx    Family History  Problem Relation Age of Onset  . Family history unknown: Yes   Social History:  reports that he quit smoking about 42 years ago. His smoking use included Cigarettes. He has never used smokeless tobacco. He reports that he does not drink alcohol or use illicit drugs.  Review of Systems  All other systems reviewed and are negative.   Physical Exam:  Vital signs in last 24 hours: Temp:  [98.1 F (36.7 C)] 98.1 F (36.7 C) (06/05 0920) Pulse Rate:  [52] 52 (06/05 0920) Resp:  [  16] 16 (06/05 0920) BP: (159)/(76) 159/76 mmHg (06/05 0920) SpO2:  [99 %] 99 % (06/05 0920) Weight:  [89.926 kg (198 lb 4 oz)-89.994 kg (198 lb 6.4 oz)] 89.926 kg (198 lb 4 oz) (06/05 0926) Physical Exam  Constitutional: He is oriented to person, place, and time. He appears well-developed and well-nourished.  HENT:  Head: Normocephalic and atraumatic.  Eyes: EOM are normal. Pupils are equal, round, and reactive to light.  Neck: Normal range of motion. No thyromegaly present.  Cardiovascular: Normal rate and regular rhythm.   Respiratory: Effort normal. No respiratory distress.  GI: Soft. He exhibits no distension.  Musculoskeletal: Normal range of motion.  Neurological: He is alert and oriented to person, place, and time.  Skin: Skin is warm and dry.  Psychiatric: He has a normal mood and affect. His  behavior is normal. Judgment and thought content normal.    Laboratory Data:  No results found for this or any previous visit (from the past 24 hour(s)). No results found for this or any previous visit (from the past 240 hour(s)). Creatinine:  Recent Labs  08/19/15 1100  CREATININE 2.14*   Baseline Creatinine: 2  Impression/Assessment:  69yo with malfunctioning IPP  Plan:  The risks/benefits/alternatives to IPP revision and penile modeling was explained to the patient and he understands and wishes to proceed with surgery  Nicolette Bang 08/25/2015, 11:24 AM

## 2015-08-25 NOTE — Anesthesia Preprocedure Evaluation (Addendum)
Anesthesia Evaluation  Patient identified by MRN, date of birth, ID band Patient awake    Reviewed: Allergy & Precautions, NPO status , Patient's Chart, lab work & pertinent test results  Airway Mallampati: II  TM Distance: >3 FB Neck ROM: Full    Dental  (+) Caps, Dental Advisory Given, Loose,    Pulmonary sleep apnea , former smoker,    Pulmonary exam normal breath sounds clear to auscultation       Cardiovascular hypertension, Pt. on medications Normal cardiovascular exam Rhythm:Regular Rate:Normal     Neuro/Psych Bipolar Disorder negative neurological ROS     GI/Hepatic negative GI ROS, Neg liver ROS,   Endo/Other  negative endocrine ROS  Renal/GU Renal InsufficiencyRenal disease  negative genitourinary   Musculoskeletal negative musculoskeletal ROS (+)   Abdominal   Peds negative pediatric ROS (+)  Hematology  (+) anemia ,   Anesthesia Other Findings   Reproductive/Obstetrics negative OB ROS                           Anesthesia Physical Anesthesia Plan  ASA: III  Anesthesia Plan: General   Post-op Pain Management:    Induction: Intravenous  Airway Management Planned: LMA  Additional Equipment:   Intra-op Plan:   Post-operative Plan:   Informed Consent: I have reviewed the patients History and Physical, chart, labs and discussed the procedure including the risks, benefits and alternatives for the proposed anesthesia with the patient or authorized representative who has indicated his/her understanding and acceptance.   Dental advisory given  Plan Discussed with: CRNA and Surgeon  Anesthesia Plan Comments:        Anesthesia Quick Evaluation

## 2015-08-25 NOTE — Transfer of Care (Signed)
Immediate Anesthesia Transfer of Care Note  Patient: Luis Miller.  Procedure(s) Performed: Procedure(s): REPLACEMENT OF INFLATABLE PENILE PROSTHESIS COMPONENTS (N/A)  Patient Location: PACU  Anesthesia Type:General  Level of Consciousness: awake, alert , oriented and patient cooperative  Airway & Oxygen Therapy: Patient Spontanous Breathing and Patient connected to face mask oxygen  Post-op Assessment: Report given to RN, Post -op Vital signs reviewed and stable and Patient moving all extremities  Post vital signs: Reviewed and stable  Last Vitals:  Filed Vitals:   08/25/15 0920  BP: 159/76  Pulse: 52  Temp: 36.7 C  Resp: 16    Last Pain: There were no vitals filed for this visit.       Complications: No apparent anesthesia complications

## 2015-08-25 NOTE — Brief Op Note (Signed)
08/25/2015  4:06 PM  PATIENT:  Luis Miller.  70 y.o. male  PRE-OPERATIVE DIAGNOSIS:  MALFUNCTIONING INFLATABLE PENILE PROSTHESIS, ERECTILE DYSFUNCTION  POST-OPERATIVE DIAGNOSIS:  MALFUNCTIONING INFLATABLE PENILE PROSTHESIS, ERECTILE DYSFUNCTION  PROCEDURE:  Procedure(s): REPLACEMENT OF INFLATABLE PENILE PROSTHESIS COMPONENTS (N/A)  SURGEON:  Surgeon(s) and Role:    * Cleon Gustin, MD - Primary  PHYSICIAN ASSISTANT:   ASSISTANTS: Harvel Ricks, MD   ANESTHESIA:   general  EBL:  Total I/O In: 1753.5 [I.V.:1700; IV Piggyback:53.5] Out: 750 [Urine:650; Blood:100]  BLOOD ADMINISTERED:none  DRAINS: Urinary Catheter (Foley)   LOCAL MEDICATIONS USED:  NONE  SPECIMEN:  No Specimen  DISPOSITION OF SPECIMEN:  N/A  COUNTS:  YES  TOURNIQUET:  * No tourniquets in log *  DICTATION: .Note written in EPIC  PLAN OF CARE: Admit for overnight observation  PATIENT DISPOSITION:  PACU - hemodynamically stable.   Delay start of Pharmacological VTE agent (>24hrs) due to surgical blood loss or risk of bleeding: not applicable

## 2015-08-25 NOTE — Progress Notes (Signed)
Pharmacy Antibiotic Note  Colm Herpel. is a 70 y.o. male admitted on 08/25/2015 for replacement of urological prosthesis.  Pharmacy has been consulted for gentamicin dosing perioperatively.  Patient did receive 1.5 mg/kg preoperatively at noon.  Plan: Patient meets criteria for extended-nterval dosing. Gentamicin 380 mg IV x 1, will give 8 hrs after preop dose Do not anticipate further treatment; pharmacy will sign off  Height: 5\' 8"  (172.7 cm) Weight: 198 lb (89.812 kg) IBW/kg (Calculated) : 68.4  Temp (24hrs), Avg:97.9 F (36.6 C), Min:97.7 F (36.5 C), Max:98.1 F (36.7 C)   Recent Labs Lab 08/19/15 1100  WBC 3.9*  CREATININE 2.14*    Estimated Creatinine Clearance: 35 mL/min (by C-G formula based on Cr of 2.14).    Allergies  Allergen Reactions  . Mirtazapine Rash  . Olanzapine Rash  . Strattera [Atomoxetine Hcl] Other (See Comments)    Urinary sx    Thank you for allowing pharmacy to be a part of this patient's care.  Reuel Boom, PharmD, BCPS Pager: [phone] 08/25/2015, 4:55 PM

## 2015-08-25 NOTE — Anesthesia Procedure Notes (Signed)
Procedure Name: Intubation Date/Time: 08/25/2015 11:47 AM Performed by: Danley Danker L Patient Re-evaluated:Patient Re-evaluated prior to inductionOxygen Delivery Method: Circle system utilized Preoxygenation: Pre-oxygenation with 100% oxygen Intubation Type: IV induction Ventilation: Mask ventilation without difficulty and Oral airway inserted - appropriate to patient size Laryngoscope Size: Miller and 3 Grade View: Grade I Tube type: Oral Tube size: 8.0 mm Number of attempts: 1 Airway Equipment and Method: Stylet Placement Confirmation: ETT inserted through vocal cords under direct vision,  positive ETCO2 and breath sounds checked- equal and bilateral Secured at: 21 cm Tube secured with: Tape Dental Injury: Teeth and Oropharynx as per pre-operative assessment

## 2015-08-25 NOTE — Op Note (Signed)
Operative Note:  Pre-operative Diagnosis: Malfunctioning inflatable penile prosthesis  Post-operative Diagnosis:  Malfunctioning inflatable penile implant  Procedure and Anesthesia:  Procedure(s) and Anesthesia Type:    * REPLACEMENT OF INFLATABLE PENILE PROSTHESIS COMPONENTS - General  Surgeon: Surgeon(s) and Role:    * Cleon Gustin, MD - Primary   Resident:  Acie Fredrickson  EBL: * No blood loss amount entered *  IVF: See anesthesia record  UOP: See anesthesia record  Drains: 77F foley catheter  Implants:  Implant Name Type Inv. Item Serial No. Manufacturer Lot No. LRB No. Used  RESERVOIR 65ML PENILE PROS - [id number] Urological Implant RESERVOIR 65ML PENILE PROS  [employer] [id number] N/A 1  5.0 cm RTE    [place] [id number]  1  PUMP PRECONNECT MS 15CM IZ - [id number] Miscellaneous PUMP PRECONNECT MS 15CM [employer] [id number] N/A 1  1.5cm RTE    [employer] [id number]  1  4.0 cm RTE       [place] [id number]   1    Specimens: None  Complications: None  Indications for Surgery: 70 y.o. male with with malfunctioning inflatable penile prosthesis. Risks, benefits, and alternatives of the above procedure were discussed previously in detail and informed consents was signed and verified.  Findings:  - Prior coloplast IPP was removed intact. No signs of infection. Left sided reservoir was emptied and left in place. 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  - Successful placement of AMS LGX: Right 15 cm + 5.5 rear tip extender; Left 15 cm with 5 cm rear tip extender.   Procedure Details: The patient and consent was verified in the pre-op holding area and brought to the operating room where they were placed on the operating table. Pre-induction time out was called  and general anesthesia was induced. SCDs were placed and IV antibiotics were started (intravenous vancomycin and gentamicin).  The patient was brought back to the operative suite. A 5 minute betadine scrub was performed. The patient was prepped and draped in the usual sterile fashion. A 16 Fr foley catheter was placed per urethra.  The penis was then placed on stretch with the aid of a hook through the meatus attached to the lonestar retractor. A 4 cm penoscrotal incision was made 1 finger breadth above the peno-scrotal junction. The subcutaneous tissues were opened with eletrocautery and the Henry sweep maneuver was performed to clear the tissue off of the corpora bilaterally. Metzenbaum scissors were used to free off the periurethral attachments.   A 3 cm corporotomy was performed bilaterally and the cylinders were freed using a right angle clamp taking care to protect the urethra. With the cylinders removed, the tubing was cut. The sub-dartos scrotal tissue was opened and the pump was removed. The tubing was also cut. We then turned our attention to the reservoir which was located in the left inguinal canal. The reservoir was drained and the tubing was cut short and retracted into the inguinal canal and it was left in place. We then placed 2 stay sutures on the medial and lateral aspect of the corporal bodies with 2-0 vicryl.   We then used sequential dilators to dilate the proximal and distal corporal body to 14 mm. We the measured the right proximal corporal length which was 10.5 cm. We then measured the right distal  corporal length which was 9.5 cm.  The corporal body was the irrigated with double antibiotic irrigation. We then did a similar technique on the left. The left proximal corporal length was 10.5 cm and the distal corporal length was 9.5 cm.   We then turned our attention to placing the reservoir. We used blunt dissection along the right spermatic cord to create a space past the inguinal  canal. We then placed the reservoir and filled it with 65cc of normal saline and then a rubber shod was placed on the tubing. We then turned our attention to placing the cylinders. We placed the proximal end of the right cylinder into the corpora and seated it with the aid of the applicator. We the placed the string attached to the distal end of the cylinder through a [place] needle. We then attached the needle to the [facility] and advanced the Furlow through the corporal incision up to the glans. The needle was then advanced through the glans and the string was then secured with a snap. We then turned our attention to the left corpora. A similar technique was used to place the left cylinder. Once this was complete we then tested the device and noted that the right cylinder was ~0.5 cm too short and sitting somewhat laterally within the distal shaft of the penis. We deflated the device and removed the right cylinder.  We re-measured the right corporal length and determined that the right proximal corporal length which was 10.5 cm. We then measured the right distal corporal length which was the same at 9.5 cm. We also were concerned that the previous distal corporal track had perforated the lateral portion of the distal right corpora. In an attempt to make a new tract we sharply incised the dorsal pseudocapsule of the prior cylinder making sure not to incise the dorsal tunica of the corpora. We then carefully dilated a new track just posterior to where the prior right cylinder had sat. This demonstrated improved placement of the distal portion of the right corpora with less lateral deviation and improved glanular placement. We then re-measured within then new posterior corporal track and the right proximal corporal length was still 10.5 cm but the right distal corporal length which was now longer at 10 cm and in a better position.   We then turned our attention to replacing the right cylinder. We the placed the string  attached to the distal end of the cylinder through a [place] needle. We then attached the needle to the [facility] and advanced the Furlow through the new more posterior corporal incision up to the glans with excellent cosmetic effect. The needle was then advanced through the glans and the string was then secured with a snap. We placed the proximal end of the right cylinder into the corpora and seated it with the aid of the applicator and then pulled the distal cylinder through the corporotomy and into the distal corpora. Once this was complete we then tested the device and noted no leaks and that the cylinders were now symmetric and the position of the right distal tip was under the glans and not sitting too laterally. We deflated the device.  Once both cylinders were appropriately seated in the corpora we then tied the stay sutures over the cylinder.  We then proceeded to make a subdartos pocket in the scrotum for the pump. Once this was complete the pump was placed the the pump in the pouch and the neck was then closed with two  interrupted 3-0 Vicryl sutures. We then turned our attention to connecting the device. The tubing was cut to length and then the locking clips were placed on either end of the tubing. A barrel connector was then placed on on end of the tubing. The tubing was then irrigated with normal saline to ensure no air bubbles in the tubing. The free end of the barrel was then attached to the reservoir tubing and using the crimping tool the barrel was secured to the tubing. We then closed the dartos over the tubing using a 3-0 vicryl in a running fashion. We then closed a second layer of dartos over the tubing in a simple running fashion with a 3-0 vicryl. The skin was then closed with 4-0 monocryl in a simple running fashion. Skin glue was then placed over the scrotal incision. We then removed the strings attached to the distal ends of the cylinders and placed skin glue over the incisions in the glans.  The device was then cycled to partially erect.  We then placed a scrotal fluff and a scrotal support and this then concluded the procedure which was well tolerated by the patient. The patient was transferred to the PACU in stable condition.  Plan: Patient is to be admitted overnight for IV antibiotics.  His foley will be removed in the morning and the device will be deactivated. He will followup in 2 weeks for a wound check. He will be discharged with 1 week of antibiotics.  Teaching Physician Attestation: Dr. Alyson Ingles was present and scrubbed for the entirety of the procedure

## 2015-08-26 DIAGNOSIS — T83420A Displacement of penile (implanted) prosthesis, initial encounter: Secondary | ICD-10-CM | POA: Diagnosis not present

## 2015-08-26 DIAGNOSIS — I129 Hypertensive chronic kidney disease with stage 1 through stage 4 chronic kidney disease, or unspecified chronic kidney disease: Secondary | ICD-10-CM | POA: Diagnosis not present

## 2015-08-26 DIAGNOSIS — F319 Bipolar disorder, unspecified: Secondary | ICD-10-CM | POA: Diagnosis not present

## 2015-08-26 DIAGNOSIS — Z79899 Other long term (current) drug therapy: Secondary | ICD-10-CM | POA: Diagnosis not present

## 2015-08-26 DIAGNOSIS — E232 Diabetes insipidus: Secondary | ICD-10-CM | POA: Diagnosis not present

## 2015-08-26 DIAGNOSIS — N189 Chronic kidney disease, unspecified: Secondary | ICD-10-CM | POA: Diagnosis not present

## 2015-08-26 LAB — BASIC METABOLIC PANEL
ANION GAP: 8 (ref 5–15)
BUN: 31 mg/dL — ABNORMAL HIGH (ref 6–20)
CALCIUM: 9.2 mg/dL (ref 8.9–10.3)
CO2: 23 mmol/L (ref 22–32)
Chloride: 110 mmol/L (ref 101–111)
Creatinine, Ser: 2.48 mg/dL — ABNORMAL HIGH (ref 0.61–1.24)
GFR, EST AFRICAN AMERICAN: 29 mL/min — AB (ref >60)
GFR, EST NON AFRICAN AMERICAN: 25 mL/min — AB (ref >60)
GLUCOSE: 162 mg/dL — AB (ref 65–99)
POTASSIUM: 4.3 mmol/L (ref 3.5–5.1)
Sodium: 141 mmol/L (ref 135–145)

## 2015-08-26 LAB — HEMOGLOBIN AND HEMATOCRIT, BLOOD
HCT: 34 % — ABNORMAL LOW (ref 39.0–52.0)
HEMOGLOBIN: 11.8 g/dL — AB (ref 13.0–17.0)

## 2015-08-26 MED ORDER — OXYCODONE-ACETAMINOPHEN 5-325 MG PO TABS: 1.0000 | ORAL_TABLET | ORAL | Status: DC | PRN

## 2015-08-26 MED ORDER — SULFAMETHOXAZOLE-TRIMETHOPRIM 400-80 MG PO TABS: 1.0000 | ORAL_TABLET | Freq: Two times a day (BID) | ORAL | Status: DC

## 2015-08-26 MED ORDER — SENNOSIDES-DOCUSATE SODIUM 8.6-50 MG PO TABS: 2.0000 | ORAL_TABLET | Freq: Two times a day (BID) | ORAL | Status: DC

## 2015-08-26 MED ORDER — AMOXICILLIN-POT CLAVULANATE 500-125 MG PO TABS: 1.0000 | ORAL_TABLET | Freq: Two times a day (BID) | ORAL | Status: DC

## 2015-08-26 NOTE — Discharge Summary (Signed)
Physician Discharge Summary  Patient ID: Luis Miller. MRN: [id number] DOB/AGE: [birth year] 70 y.o.  Admit date: 08/25/2015 Discharge date: 08/26/2015  Admission Diagnoses: Erectile dysfunction  Discharge Diagnoses:  Active Problems:   Erectile dysfunction   Discharged Condition: good  Hospital Course:  70 yo male who is s/p removal and replacement of all components of an inflatable penile prosthesis. He did well post-operatively. His prosthesis was initially left partially inflated POD#0 and was then deflated and his catheter was removed on the morning of POD#1. He passed his trial of void without difficulty. His diet was slowly advanced and at the time of discharge he was tolerating a regular diet, ambulating at his baseline, was voiding spontaneously without difficulty, and pain was well controlled with oral narcotics. He was discharged to home on the morning of POD#1.  Consults: None  Significant Diagnostic Studies: none  Treatments: surgery: removal and replacement of all components of an inflatable penile prosthesis  Discharge Exam: Blood pressure 126/66, pulse 60, temperature 97.7 F (36.5 C), temperature source Oral, resp. rate 16, height 5\' 8"  (1.727 m), weight 89.812 kg (198 lb), SpO2 97 %. General:alert, cooperative and appears stated age GI: soft, non tender, normal bowel sounds, no palpable masses, no organomegaly, no inguinal hernia Male genitalia: Circumcised male phallus s/p IPP with mild bruising on the glans. Scrotal incision with without erythema, induration or exudate with dermabond in place. Pump is in dependent portion of the scrotum and IPP was deflated at the bedside. Foley out.  Disposition: 01-Home or Self Care      Discharge Instructions    Call MD for:  difficulty breathing, headache or visual disturbances    Complete by:  As directed      Call MD for:  extreme fatigue    Complete by:  As directed      Call MD for:  hives    Complete by:  As  directed      Call MD for:  persistant dizziness or light-headedness    Complete by:  As directed      Call MD for:  persistant nausea and vomiting    Complete by:  As directed      Call MD for:  redness, tenderness, or signs of infection (pain, swelling, redness, odor or green/yellow discharge around incision site)    Complete by:  As directed      Call MD for:  severe uncontrolled pain    Complete by:  As directed      Call MD for:  temperature >100.4    Complete by:  As directed      Diet - low sodium heart healthy    Complete by:  As directed      Increase activity slowly    Complete by:  As directed             Medication List    TAKE these medications        aMILoride 5 MG tablet  Commonly known as:  MIDAMOR  TAKE 1 TABLET ONE TIME DAILY     amLODipine 10 MG tablet  Commonly known as:  NORVASC  TAKE 1 TABLET ONE TIME DAILY     benazepril 40 MG tablet  Commonly known as:  LOTENSIN  Take 40 mg by mouth daily.     carvedilol 6.25 MG tablet  Commonly known as:  COREG  Take 6.25 mg by mouth 2 (two) times daily with a meal.     cetirizine 10 MG  tablet  Commonly known as:  ZYRTEC  Take 10 mg by mouth at bedtime.     hydrALAZINE 100 MG tablet  Commonly known as:  APRESOLINE  Take 100 mg by mouth 3 (three) times daily.     lithium carbonate 450 MG CR tablet  Commonly known as:  ESKALITH  Take 1 tablet (450 mg total) by mouth daily.     multivitamin tablet  Take 1 tablet by mouth daily.     oxyCODONE-acetaminophen 5-325 MG tablet  Commonly known as:  ROXICET  Take 1-2 tablets by mouth every 4 (four) hours as needed for severe pain.     QUEtiapine 200 MG tablet  Commonly known as:  SEROQUEL  Take 1 tablet (200 mg total) by mouth at bedtime.     senna-docusate 8.6-50 MG tablet  Commonly known as:  Senokot-S  Take 2 tablets by mouth 2 (two) times daily. While taking narcotics. Hold for diarrhea.     sulfamethoxazole-trimethoprim 400-80 MG tablet  Commonly  known as:  BACTRIM  Take 1 tablet by mouth 2 (two) times daily.     VITAMIN D (CHOLECALCIFEROL) PO  Take 1 tablet by mouth daily.         Signed: Acie Fredrickson 08/26/2015, 12:48 PM

## 2015-08-26 NOTE — Progress Notes (Signed)
Patient discharged home, discharge instructions given and explained to patient and he verbalized understanding, denies any distress, accompanied home by ex-wife. No wound noted.

## 2015-08-26 NOTE — Discharge Instructions (Signed)
Inflatable Penile Prosthesis-Home Care Instructions  The following instructions have been prepared to help you care for yourself upon your return home today.   Wound Care & Hygiene:   You may apply ice to the penis.  This may help to decrease swelling.  Remove the dressing tomorrow.  If the dressing falls off before then, leave it off.  You may shower or bathe in 48 hours  Gently wash the penis with soap and water.  The stitches do not need to be removed.  Activity:  Do not drive or operate any equipment today.  The effects of anesthesia are still present, drowsiness may result.  Rest today, not necessarily flat bed rest, just take it easy.  You may resume your normal activity in one to two days or as indicated by your physician.  DO NOT ACTIVATE OR ATTEMPT TO USE YOUR PROSTHESIS DEVICE UNTIL ADVISED TO BY DR. Alyson Ingles (6 weeks following surgery)  Finish 14 day course of antibiotics as prescribed  At 10 days following surgery you may soak in a bathtub for 20 minutes per day and massage pump down into scrotum  Sexual Activity:  Erection and sexual relations should be avoided until you are instructed otherwise by Dr. Alyson Ingles  Return to Work:  One week. DO NOT LIFT ANY OBJECTS HEAVIER THAN A GALLON OF MILK (10 Lbs) or RIDE ON HEAVY MACHINERY UNTIL INSTRUCTED OTHERWISE  Diet:  Drink liquids or eat a very light diet this evening.  You may resume a regular diet tomorrow.  General Expectations of your surgery:   You may have a small amount of bleeding  The penis will be swollen and bruised for approximately one week  You may wake during the night with an erection, usually this is caused by having a full bladder so you should try to urinate (pass your water) to relieve the erection or apply ice to the penis  Unexpected Observations - Call your doctor if these occur!  Persistent or heavy bleeding  Temperature of 101 degrees or more  Severe pain not relieved by medication

## 2015-08-26 NOTE — Progress Notes (Signed)
1 Day Post-Op Subjective: Patient reports pain control good. He is tolerating clears. He has walked in the halls. No N/V.  Objective: Vital signs in last 24 hours: Temp:  [97.7 F (36.5 C)-98.1 F (36.7 C)] 97.7 F (36.5 C) (06/06 0519) Pulse Rate:  [50-64] 53 (06/06 0519) Resp:  [10-21] 16 (06/06 0519) BP: (118-159)/(65-88) 134/68 mmHg (06/06 0519) SpO2:  [96 %-100 %] 97 % (06/06 0519) FiO2 (%):  [2 %] 2 % (06/05 1625) Weight:  [89.812 kg (198 lb)-89.994 kg (198 lb 6.4 oz)] 89.812 kg (198 lb) (06/05 1625)  Intake/Output from previous day: 06/05 0701 - 06/06 0700 In: 2855.6 [I.V.:2602.1; IV Piggyback:253.5] Out: 1400 [Urine:1300; Blood:100] Intake/Output this shift: Total I/O In: 1102.1 [I.V.:902.1; IV Piggyback:200] Out: 400 [Urine:400]  Physical Exam:  General:alert, cooperative and appears stated age GI: soft, non tender, normal bowel sounds, no palpable masses, no organomegaly, no inguinal hernia Male genitalia: Circumcised male phallus s/p IPP with mild bruising on the glans. Scrotal incision with without erythema, induration or exudate with dermabond in place. Pump is in dependent portion of the scrotum and IPP was deflated at the bedside.  Lab Results:  Recent Labs  08/26/15 0524  HGB 11.8*  HCT 34.0*   BMET  Recent Labs  08/26/15 0524  NA 141  K 4.3  CL 110  CO2 23  GLUCOSE 162*  BUN 31*  CREATININE 2.48*  CALCIUM 9.2   No results for input(s): LABPT, INR in the last 72 hours. No results for input(s): LABURIN in the last 72 hours. No results found for this or any previous visit.  Studies/Results: No results found.  Assessment/Plan: 1 Day Post-Op Procedure(s) (LRB): REPLACEMENT OF INFLATABLE PENILE PROSTHESIS COMPONENTS (N/A)\   Deflated IPP at bedside  Remove foley catheter  Follow up TOV  Regular diet  [place]  Ambulate  Plan to discharge later this AM    Acie Fredrickson 08/26/2015, 6:52 AM

## 2015-08-28 ENCOUNTER — Other Ambulatory Visit: Payer: Self-pay | Admitting: Family Medicine

## 2015-09-08 DIAGNOSIS — N5201 Erectile dysfunction due to arterial insufficiency: Secondary | ICD-10-CM | POA: Diagnosis not present

## 2015-09-09 ENCOUNTER — Other Ambulatory Visit: Payer: Self-pay | Admitting: Family Medicine

## 2015-09-16 ENCOUNTER — Encounter: Payer: Medicare Other | Admitting: Family Medicine

## 2015-09-16 DIAGNOSIS — E232 Diabetes insipidus: Secondary | ICD-10-CM | POA: Diagnosis not present

## 2015-09-16 DIAGNOSIS — N2581 Secondary hyperparathyroidism of renal origin: Secondary | ICD-10-CM | POA: Diagnosis not present

## 2015-09-16 DIAGNOSIS — I10 Essential (primary) hypertension: Secondary | ICD-10-CM | POA: Diagnosis not present

## 2015-09-16 DIAGNOSIS — N183 Chronic kidney disease, stage 3 (moderate): Secondary | ICD-10-CM | POA: Diagnosis not present

## 2015-10-10 DIAGNOSIS — I10 Essential (primary) hypertension: Secondary | ICD-10-CM | POA: Diagnosis not present

## 2015-10-10 DIAGNOSIS — N2581 Secondary hyperparathyroidism of renal origin: Secondary | ICD-10-CM | POA: Diagnosis not present

## 2015-10-10 DIAGNOSIS — E232 Diabetes insipidus: Secondary | ICD-10-CM | POA: Diagnosis not present

## 2015-10-10 DIAGNOSIS — N183 Chronic kidney disease, stage 3 (moderate): Secondary | ICD-10-CM | POA: Diagnosis not present

## 2015-10-14 ENCOUNTER — Encounter (INDEPENDENT_AMBULATORY_CARE_PROVIDER_SITE_OTHER): Payer: Self-pay

## 2015-10-16 DIAGNOSIS — L821 Other seborrheic keratosis: Secondary | ICD-10-CM | POA: Diagnosis not present

## 2015-10-16 DIAGNOSIS — L570 Actinic keratosis: Secondary | ICD-10-CM | POA: Diagnosis not present

## 2015-10-16 DIAGNOSIS — L02223 Furuncle of chest wall: Secondary | ICD-10-CM | POA: Diagnosis not present

## 2015-10-16 DIAGNOSIS — X32XXXA Exposure to sunlight, initial encounter: Secondary | ICD-10-CM | POA: Diagnosis not present

## 2015-10-16 DIAGNOSIS — Z85828 Personal history of other malignant neoplasm of skin: Secondary | ICD-10-CM | POA: Diagnosis not present

## 2015-10-16 DIAGNOSIS — Z85820 Personal history of malignant melanoma of skin: Secondary | ICD-10-CM | POA: Diagnosis not present

## 2015-11-11 ENCOUNTER — Ambulatory Visit (INDEPENDENT_AMBULATORY_CARE_PROVIDER_SITE_OTHER): Payer: Medicare Other | Admitting: Family Medicine

## 2015-11-11 ENCOUNTER — Encounter: Payer: Self-pay | Admitting: Family Medicine

## 2015-11-11 VITALS — BP 134/72 | HR 49 | Temp 98.4°F | Ht 67.3 in | Wt 198.0 lb

## 2015-11-11 DIAGNOSIS — I10 Essential (primary) hypertension: Secondary | ICD-10-CM

## 2015-11-11 DIAGNOSIS — Z0000 Encounter for general adult medical examination without abnormal findings: Secondary | ICD-10-CM

## 2015-11-11 DIAGNOSIS — M10371 Gout due to renal impairment, right ankle and foot: Secondary | ICD-10-CM | POA: Diagnosis not present

## 2015-11-11 DIAGNOSIS — N183 Chronic kidney disease, stage 3 unspecified: Secondary | ICD-10-CM

## 2015-11-11 DIAGNOSIS — F3176 Bipolar disorder, in full remission, most recent episode depressed: Secondary | ICD-10-CM | POA: Diagnosis not present

## 2015-11-11 DIAGNOSIS — R972 Elevated prostate specific antigen [PSA]: Secondary | ICD-10-CM

## 2015-11-11 DIAGNOSIS — E785 Hyperlipidemia, unspecified: Secondary | ICD-10-CM | POA: Diagnosis not present

## 2015-11-11 DIAGNOSIS — R5383 Other fatigue: Secondary | ICD-10-CM

## 2015-11-11 LAB — URINALYSIS, ROUTINE W REFLEX MICROSCOPIC
Bilirubin, UA: NEGATIVE
GLUCOSE, UA: NEGATIVE
Ketones, UA: NEGATIVE
LEUKOCYTES UA: NEGATIVE
Nitrite, UA: NEGATIVE
PH UA: 5.5 (ref 5.0–7.5)
RBC UA: NEGATIVE
Urobilinogen, Ur: 0.2 mg/dL (ref 0.2–1.0)

## 2015-11-11 MED ORDER — QUETIAPINE FUMARATE 200 MG PO TABS: 200.0000 mg | ORAL_TABLET | Freq: Every day | ORAL | 1 refills | Status: DC

## 2015-11-11 MED ORDER — LITHIUM CARBONATE ER 450 MG PO TBCR: 450.0000 mg | EXTENDED_RELEASE_TABLET | Freq: Every day | ORAL | 1 refills | Status: DC

## 2015-11-11 MED ORDER — AMLODIPINE BESYLATE 10 MG PO TABS: 10.0000 mg | ORAL_TABLET | Freq: Every day | ORAL | 4 refills | Status: DC

## 2015-11-11 MED ORDER — AMILORIDE HCL 5 MG PO TABS: 5.0000 mg | ORAL_TABLET | Freq: Every day | ORAL | 4 refills | Status: DC

## 2015-11-11 NOTE — Assessment & Plan Note (Signed)
The current medical regimen is effective;  continue present plan and medications.  

## 2015-11-11 NOTE — Assessment & Plan Note (Addendum)
Stable followed by nephrology 

## 2015-11-11 NOTE — Progress Notes (Signed)
BP 134/72 (BP Location: Left Arm, Patient Position: Sitting, Cuff Size: Normal)   Pulse (!) 49   Temp 98.4 F (36.9 C)   Ht 5' 7.3" (1.709 m)   Wt 198 lb (89.8 kg)   SpO2 96%   BMI 30.74 kg/m    Subjective:    Patient ID: Luis Coss., male    DOB: [birth year], 70 y.o.   MRN: [id number]  HPI: Luis Bloor. is a 70 y.o. male  Chief Complaint  Patient presents with  . Annual Exam  Patient with multiple problems all in all doing well For Medicare annual wellness visit metrics were met Hypertension good control no complaints from medications taken faithfully without problems Bipolar stable on lithium and quetiapine hasn't been back to psychiatry is hasn't found one in town will be making an appointment soon go to with a local psychiatrist. Burnis Medin need to check lithium level today. Patient's toe which was tentatively diagnosed with gout hasn't really behaved as gout more like just arthritis. We'll check uric acid today  ED patient had reconstruction again and following with urology Patient does have arthr as mentioned also left knee some discomfort especially going up and down stairs No clicking locking or giving way Is seeing nephrology on a regular basis and renal function is stable notTaking aspirin Advil and Aleve  Relevant past medical, surgical, family and social history reviewed and updated as indicated. Interim medical history since our last visit reviewed. Allergies and medications reviewed and updated.  Review of Systems  Constitutional: Negative.   HENT: Negative.   Eyes: Negative.   Respiratory: Negative.   Cardiovascular: Negative.   Gastrointestinal: Negative.   Endocrine: Negative.   Genitourinary: Negative.   Musculoskeletal: Negative.   Skin: Negative.   Allergic/Immunologic: Negative.   Neurological: Negative.   Hematological: Negative.   Psychiatric/Behavioral: Negative.     Per HPI unless specifically indicated above     Objective:      BP 134/72 (BP Location: Left Arm, Patient Position: Sitting, Cuff Size: Normal)   Pulse (!) 49   Temp 98.4 F (36.9 C)   Ht 5' 7.3" (1.709 m)   Wt 198 lb (89.8 kg)   SpO2 96%   BMI 30.74 kg/m   Wt Readings from Last 3 Encounters:  11/11/15 198 lb (89.8 kg)  08/25/15 198 lb (89.8 kg)  08/19/15 205 lb (93 kg)    Physical Exam  Constitutional: He is oriented to person, place, and time. He appears well-developed and well-nourished.  HENT:  Head: Normocephalic.  Right Ear: External ear normal.  Left Ear: External ear normal.  Nose: Nose normal.  Eyes: Conjunctivae and EOM are normal. Pupils are equal, round, and reactive to light.  Neck: Normal range of motion. Neck supple. No thyromegaly present.  Cardiovascular: Normal rate, regular rhythm, normal heart sounds and intact distal pulses.   Pulmonary/Chest: Effort normal and breath sounds normal.  Abdominal: Soft. Bowel sounds are normal. There is no splenomegaly or hepatomegaly.  Genitourinary:  Genitourinary Comments: Done at urology  Musculoskeletal: Normal range of motion.  Lymphadenopathy:    He has no cervical adenopathy.  Neurological: He is alert and oriented to person, place, and time. He has normal reflexes.  Skin: Skin is warm and dry.  Psychiatric: He has a normal mood and affect. His behavior is normal. Judgment and thought content normal.    Results for orders placed or performed during the hospital encounter of 76/81/15  Basic metabolic panel  Result  Value Ref Range   Sodium 141 135 - 145 mmol/L   Potassium 4.3 3.5 - 5.1 mmol/L   Chloride 110 101 - 111 mmol/L   CO2 23 22 - 32 mmol/L   Glucose, Bld 162 (H) 65 - 99 mg/dL   BUN 31 (H) 6 - 20 mg/dL   Creatinine, Ser 2.48 (H) 0.61 - 1.24 mg/dL   Calcium 9.2 8.9 - 10.3 mg/dL   GFR calc non Af Amer 25 (L) >60 mL/min   GFR calc Af Amer 29 (L) >60 mL/min   Anion gap 8 5 - 15  Hemoglobin and hematocrit, blood  Result Value Ref Range   Hemoglobin 11.8 (L) 13.0 -  17.0 g/dL   HCT 34.0 (L) 39.0 - 52.0 %      Assessment & Plan:   Problem List Items Addressed This Visit      Cardiovascular and Mediastinum   Hypertension (Chronic)    The current medical regimen is effective;  continue present plan and medications.       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       Relevant Medications   QUEtiapine (SEROQUEL) 200 MG tablet   Other Relevant Orders   Lithium level   RESOLVED: Elevated PSA   Relevant Orders   PSA    Other Visit Diagnoses    Well adult exam    -  Primary   Relevant Orders   CBC with Differential/Platelet   Comprehensive metabolic panel   Lipid Panel w/o Chol/HDL Ratio   PSA   TSH   Urinalysis, Routine w reflex microscopic (not at [facility])   Other fatigue       Relevant Orders   CBC with Differential/Platelet   TSH       Follow up plan: Return in about 6 months (around 05/13/2016), or if symptoms worsen or fail to improve, for med check.

## 2015-11-11 NOTE — Assessment & Plan Note (Signed)
Symptoms more like arthritis will check uric acid

## 2015-11-12 ENCOUNTER — Telehealth: Payer: Self-pay | Admitting: Family Medicine

## 2015-11-12 DIAGNOSIS — R972 Elevated prostate specific antigen [PSA]: Secondary | ICD-10-CM

## 2015-11-12 LAB — CBC WITH DIFFERENTIAL/PLATELET
BASOS: 1 %
Basophils Absolute: 0 10*3/uL (ref 0.0–0.2)
EOS (ABSOLUTE): 0.2 10*3/uL (ref 0.0–0.4)
EOS: 5 %
HEMATOCRIT: 36.5 % — AB (ref 37.5–51.0)
HEMOGLOBIN: 12.4 g/dL — AB (ref 12.6–17.7)
Immature Grans (Abs): 0 10*3/uL (ref 0.0–0.1)
Immature Granulocytes: 1 %
LYMPHS ABS: 0.6 10*3/uL — AB (ref 0.7–3.1)
Lymphs: 18 %
MCH: 33.8 pg — ABNORMAL HIGH (ref 26.6–33.0)
MCHC: 34 g/dL (ref 31.5–35.7)
MCV: 100 fL — AB (ref 79–97)
MONOCYTES: 10 %
MONOS ABS: 0.3 10*3/uL (ref 0.1–0.9)
NEUTROS ABS: 2.4 10*3/uL (ref 1.4–7.0)
Neutrophils: 65 %
Platelets: 141 10*3/uL — ABNORMAL LOW (ref 150–379)
RBC: 3.67 x10E6/uL — ABNORMAL LOW (ref 4.14–5.80)
RDW: 13.8 % (ref 12.3–15.4)
WBC: 3.6 10*3/uL (ref 3.4–10.8)

## 2015-11-12 LAB — COMPREHENSIVE METABOLIC PANEL
A/G RATIO: 1.7 (ref 1.2–2.2)
ALBUMIN: 4.1 g/dL (ref 3.5–4.8)
ALK PHOS: 61 IU/L (ref 39–117)
ALT: 14 IU/L (ref 0–44)
AST: 15 IU/L (ref 0–40)
BILIRUBIN TOTAL: 0.4 mg/dL (ref 0.0–1.2)
BUN / CREAT RATIO: 12 (ref 10–24)
BUN: 26 mg/dL (ref 8–27)
CHLORIDE: 112 mmol/L — AB (ref 96–106)
CO2: 22 mmol/L (ref 18–29)
Calcium: 9.4 mg/dL (ref 8.6–10.2)
Creatinine, Ser: 2.21 mg/dL — ABNORMAL HIGH (ref 0.76–1.27)
GFR calc Af Amer: 33 mL/min/{1.73_m2} — ABNORMAL LOW (ref >59)
GFR calc non Af Amer: 29 mL/min/{1.73_m2} — ABNORMAL LOW (ref >59)
GLOBULIN, TOTAL: 2.4 g/dL (ref 1.5–4.5)
Glucose: 102 mg/dL — ABNORMAL HIGH (ref 65–99)
POTASSIUM: 4.3 mmol/L (ref 3.5–5.2)
SODIUM: 149 mmol/L — AB (ref 134–144)
Total Protein: 6.5 g/dL (ref 6.0–8.5)

## 2015-11-12 LAB — LIPID PANEL W/O CHOL/HDL RATIO
CHOLESTEROL TOTAL: 184 mg/dL (ref 100–199)
HDL: 34 mg/dL — ABNORMAL LOW (ref >39)
LDL Calculated: 98 mg/dL (ref 0–99)
Triglycerides: 261 mg/dL — ABNORMAL HIGH (ref 0–149)
VLDL Cholesterol Cal: 52 mg/dL — ABNORMAL HIGH (ref 5–40)

## 2015-11-12 LAB — TSH: TSH: 0.751 u[IU]/mL (ref 0.450–4.500)

## 2015-11-12 LAB — PSA: Prostate Specific Ag, Serum: 6.4 ng/mL — ABNORMAL HIGH (ref 0.0–4.0)

## 2015-11-12 NOTE — Telephone Encounter (Signed)
Phone call Discussed with patient PSA remains elevated little more so we'll recheck PSA 6 months

## 2015-11-13 LAB — LITHIUM LEVEL: LITHIUM LVL: 0.7 mmol/L (ref 0.6–1.2)

## 2015-11-13 LAB — URIC ACID: URIC ACID: 9.2 mg/dL — AB (ref 3.7–8.6)

## 2015-11-13 LAB — SPECIMEN STATUS REPORT

## 2015-11-25 ENCOUNTER — Encounter: Payer: Self-pay | Admitting: Urology

## 2015-11-25 ENCOUNTER — Ambulatory Visit (INDEPENDENT_AMBULATORY_CARE_PROVIDER_SITE_OTHER): Payer: Medicare Other | Admitting: Urology

## 2015-11-25 VITALS — BP 115/65 | HR 54 | Ht 67.0 in | Wt 192.7 lb

## 2015-11-25 DIAGNOSIS — N5201 Erectile dysfunction due to arterial insufficiency: Secondary | ICD-10-CM

## 2015-11-25 NOTE — Progress Notes (Signed)
11/25/2015 11:39 AM   Luis Miller. [birth year] [id number]  Referring provider: Guadalupe Maple, MD [address] [place], [place] [address]  Chief Complaint  Patient presents with  . Routine Post Op    HPI: Luis Miller is a 70yo with a hx of ED here for followup after IPP placement. IPP revision was done on 08/25/2015.  He has been using his IPP with good results. He has numbenss on ventral aspect of penis.  PMH: Past Medical History:  Diagnosis Date  . Anemia   . Apnea, sleep   . Bipolar affective ([place])   . Cancer ([place]) malignant melanoma  . Crohn's disease ([place])   . Diabetes insipidus ([place])   . Erectile dysfunction   . Hyperlipidemia   . Hypertension   . IBS (irritable bowel syndrome)   . Impotence   . Internal hemorrhoids   . 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[place])   . Paraphimosis   . Peyronie disease   . Secondary hyperparathyroidism of renal origin ([place])   . Skin cancer   . Testicular hypofunction   . Urinary frequency   . Urinary hesitancy     Surgical History: Past Surgical History:  Procedure Laterality Date  . arm fracture    . BONE MARROW BIOPSY    . COLONOSCOPY WITH PROPOFOL N/A 12/23/2014   Procedure: COLONOSCOPY WITH PROPOFOL;  Surgeon: Luis Silvas, MD;  Location: [facility] ENDOSCOPY;  Service: Endoscopy;  Laterality: N/A;  . MELANOMA EXCISION    . PENILE PROSTHESIS IMPLANT    . PENILE PROSTHESIS IMPLANT N/A 08/25/2015   Procedure: REPLACEMENT OF INFLATABLE PENILE PROSTHESIS COMPONENTS;  Surgeon: Luis Gustin, MD;  Location: [facility] ORS;  Service: Urology;  Laterality: N/A;  . TONSILLECTOMY    . VASECTOMY      Home Medications:    Medication List       Accurate as of 11/25/15 11:39 AM. Always use your most recent med list.          aMILoride 5 MG tablet Commonly known as:  MIDAMOR Take 1 tablet (5 mg total) by mouth daily.   amLODipine 10 MG tablet Commonly known as:  NORVASC Take 1 tablet (10 mg total) by mouth daily.   benazepril 40  MG tablet Commonly known as:  LOTENSIN Take 40 mg by mouth daily.   carvedilol 6.25 MG tablet Commonly known as:  COREG Take 6.25 mg by mouth 2 (two) times daily with a meal.   cetirizine 10 MG tablet Commonly known as:  ZYRTEC Take 10 mg by mouth at bedtime.   febuxostat 40 MG tablet Commonly known as:  ULORIC Take 40 mg by mouth daily.   fluticasone 50 MCG/ACT nasal spray Commonly known as:  FLONASE Place 2 sprays into both nostrils daily as needed for allergies or rhinitis.   hydrALAZINE 100 MG tablet Commonly known as:  APRESOLINE Take 100 mg by mouth 3 (three) times daily.   lithium carbonate 450 MG CR tablet Commonly known as:  ESKALITH Take 1 tablet (450 mg total) by mouth daily.   multivitamin tablet Take 1 tablet by mouth daily.   QUEtiapine 200 MG tablet Commonly known as:  SEROQUEL Take 1 tablet (200 mg total) by mouth at bedtime.   VITAMIN D (CHOLECALCIFEROL) PO Take 1 tablet by mouth daily.       Allergies:  Allergies  Allergen Reactions  . Mirtazapine Rash  . Olanzapine Rash  . Atomoxetine Other (See Comments)    Urinary sx  .  Strattera [Atomoxetine Hcl] Other (See Comments)    Urinary sx    Family History: Family History  Problem Relation Age of Onset  . Family history unknown: Yes    Social History:  reports that he quit smoking about 43 years ago. His smoking use included Cigarettes. He has never used smokeless tobacco. He reports that he does not drink alcohol or use drugs.  ROS: UROLOGY Frequent Urination?: No Hard to postpone urination?: No Burning/pain with urination?: No Get up at night to urinate?: No Leakage of urine?: No Urine stream starts and stops?: No Trouble starting stream?: No Do you have to strain to urinate?: No Blood in urine?: No Urinary tract infection?: No Sexually transmitted disease?: No Injury to kidneys or bladder?: No Painful intercourse?: No Weak stream?: No Erection problems?: No Penile pain?:  No  Gastrointestinal Nausea?: No Vomiting?: No Indigestion/heartburn?: No Diarrhea?: No Constipation?: No  Constitutional Fever: No Night sweats?: No Weight loss?: No Fatigue?: No  Skin Skin rash/lesions?: No Itching?: No  Eyes Blurred vision?: No Double vision?: No  Ears/Nose/Throat Sore throat?: No Sinus problems?: No  Hematologic/Lymphatic Swollen glands?: No Easy bruising?: No  Cardiovascular Leg swelling?: No Chest pain?: No  Respiratory Cough?: No Shortness of breath?: No  Endocrine Excessive thirst?: No  Musculoskeletal Back pain?: No Joint pain?: No  Neurological Headaches?: No Dizziness?: No  Psychologic Depression?: Yes Anxiety?: No  Physical Exam: BP 115/65   Pulse (!) 54   Ht 5' 7"  (1.702 m)   Wt 87.4 kg (192 lb 11.2 oz)   BMI 30.18 kg/m   Constitutional:  Alert and oriented, No acute distress. HEENT: [place] AT, moist mucus membranes.  Trachea midline, no masses. Cardiovascular: No clubbing, cyanosis, or edema. Respiratory: Normal respiratory effort, no increased work of breathing. GI: Abdomen is soft, nontender, nondistended, no abdominal masses GU: No CVA tenderness. Well healed incisions. IPP cycles normal Skin: No rashes, bruises or suspicious lesions. Lymph: No cervical or inguinal adenopathy. Neurologic: Grossly intact, no focal deficits, moving all 4 extremities. Psychiatric: Normal mood and affect.  Laboratory Data: Lab Results  Component Value Date   WBC 3.6 11/11/2015   HGB 11.8 (L) 08/26/2015   HCT 36.5 (L) 11/11/2015   MCV 100 (H) 11/11/2015   PLT 141 (L) 11/11/2015    Lab Results  Component Value Date   CREATININE 2.21 (H) 11/11/2015    No results found for: PSA  No results found for: TESTOSTERONE  No results found for: HGBA1C  Urinalysis    Component Value Date/Time   APPEARANCEUR Clear 11/11/2015 0916   GLUCOSEU Negative 11/11/2015 0916   BILIRUBINUR Negative 11/11/2015 0916   PROTEINUR Trace  11/11/2015 0916   NITRITE Negative 11/11/2015 0916   LEUKOCYTESUR Negative 11/11/2015 0916    Pertinent Imaging: none  Assessment & Plan:    1. ED s/p IPP -RTC 3 months   There are no diagnoses linked to this encounter.  No Follow-up on file.  Luis Bang, MD  [facility] [address], [place], [place] [address] [phone]

## 2015-12-29 DIAGNOSIS — E232 Diabetes insipidus: Secondary | ICD-10-CM | POA: Diagnosis not present

## 2015-12-29 DIAGNOSIS — N183 Chronic kidney disease, stage 3 (moderate): Secondary | ICD-10-CM | POA: Diagnosis not present

## 2015-12-29 DIAGNOSIS — N2581 Secondary hyperparathyroidism of renal origin: Secondary | ICD-10-CM | POA: Diagnosis not present

## 2015-12-29 DIAGNOSIS — I10 Essential (primary) hypertension: Secondary | ICD-10-CM | POA: Diagnosis not present

## 2016-01-02 ENCOUNTER — Ambulatory Visit (INDEPENDENT_AMBULATORY_CARE_PROVIDER_SITE_OTHER): Payer: Medicare Other

## 2016-01-02 DIAGNOSIS — Z23 Encounter for immunization: Secondary | ICD-10-CM

## 2016-01-08 DIAGNOSIS — N183 Chronic kidney disease, stage 3 (moderate): Secondary | ICD-10-CM | POA: Diagnosis not present

## 2016-01-08 DIAGNOSIS — I10 Essential (primary) hypertension: Secondary | ICD-10-CM | POA: Diagnosis not present

## 2016-01-08 DIAGNOSIS — N2581 Secondary hyperparathyroidism of renal origin: Secondary | ICD-10-CM | POA: Diagnosis not present

## 2016-01-08 DIAGNOSIS — E233 Hypothalamic dysfunction, not elsewhere classified: Secondary | ICD-10-CM | POA: Diagnosis not present

## 2016-01-22 ENCOUNTER — Ambulatory Visit (INDEPENDENT_AMBULATORY_CARE_PROVIDER_SITE_OTHER): Payer: Medicare Other | Admitting: Psychiatry

## 2016-01-22 ENCOUNTER — Encounter: Payer: Self-pay | Admitting: Psychiatry

## 2016-01-22 ENCOUNTER — Ambulatory Visit: Payer: Medicare Other | Admitting: Psychiatry

## 2016-01-22 VITALS — BP 150/82 | HR 58 | Temp 97.8°F | Wt 206.2 lb

## 2016-01-22 DIAGNOSIS — F3160 Bipolar disorder, current episode mixed, unspecified: Secondary | ICD-10-CM

## 2016-01-22 NOTE — Progress Notes (Signed)
Psychiatric Initial Adult Assessment   Patient Identification: Luis Miller. MRN:  [id number] Date of Evaluation:  01/22/2016 Referral Source: Golden Pop, M.D Chief Complaint:   Chief Complaint    Establish Care; Depression; Manic Behavior     Visit Diagnosis:    ICD-9-CM ICD-10-CM   1. Bipolar I disorder, most recent episode mixed ([place]) 296.60 F31.60     History of Present Illness:    Patient is a 70 year old divorced male with history of bipolar disorder presented for initial assessment. He reported that he was previously following Dr. Vallarie Mare for many years. He reported that he was looking for a new psychiatrist as nobody is accepting his insurance at this time. He stated that he has been getting his medications from his primary care physician. He stated that he is currently stable on his medications and has chronic kidney disease and is taking lithium for many years but cannot stop taking the lithium due to his mood swings. He is currently following with Dr. Zollie Scale at [facility] and he has been monitoring his kidney function closely. Patient stated that he has tried several psycho topic medications in the past but they do not work. He reported that he feels that the dose of lithium is helping him. He is also on Seroquel at this time. He appeared younger than his stated age. He stated that he has a new relationship for the past 6 months and he is doing line dancing every Friday evening. He stated that he feels physically active.  Patient appears somewhat tired during the interview. He has been taking lithium carbonate in the morning. He reported that he has always taken lithium in the morning. He takes Seroquel at night. He reported that some things can trigger his depression. He has a strong family history of bipolar as well. He currently denied having any suicidal ideations or plans. He appeared calm and receptive during the interview.  Associated  Signs/Symptoms: Depression Symptoms:  psychomotor retardation, fatigue, loss of energy/fatigue, (Hypo) Manic Symptoms:  denied Anxiety Symptoms:  Excessive Worry, Psychotic Symptoms:  none PTSD Symptoms: Negative NA  Past Psychiatric History:  Long history of bipolar disorder. He reported that he has seen Dr. Vallarie Mare for many years in the past. ECT Treatment - several  Psychiatric Admission x 4, [facility], [facility], [place] [place], [place] No h/o Suicide attemtps.   Previous Psychotropic Medications:  Lithium Mellaril Seroquel Does not remember the names.   Allergies- Remeron Olanzapine Strattera   Substance Abuse History in the last 12 months:  No.  Consequences of Substance Abuse: Negative NA  Past Medical History:  Past Medical History:  Diagnosis Date  . Anemia   . Apnea, sleep   . Bipolar affective ([place])   . Cancer ([place]) malignant melanoma  . Crohn's disease ([place])   . Depression   . Diabetes insipidus ([place])   . Erectile dysfunction   . Hyperlipidemia   . Hypertension   . IBS (irritable bowel syndrome)   . Impotence   . Internal hemorrhoids   . 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[place])   . Paraphimosis   . Peyronie disease   . Secondary hyperparathyroidism of renal origin ([place])   . Skin cancer   . Testicular hypofunction   . Urinary frequency   . Urinary hesitancy     Past Surgical History:  Procedure Laterality Date  . arm fracture    . BONE MARROW BIOPSY    . COLONOSCOPY WITH PROPOFOL N/A 12/23/2014   Procedure: COLONOSCOPY WITH  PROPOFOL;  Surgeon: Scot Jun, MD;  Location: [facility] ENDOSCOPY;  Service: Endoscopy;  Laterality: N/A;  . MELANOMA EXCISION    . PENILE PROSTHESIS IMPLANT    . PENILE PROSTHESIS IMPLANT N/A 08/25/2015   Procedure: REPLACEMENT OF INFLATABLE PENILE PROSTHESIS COMPONENTS;  Surgeon: Malen Gauze, MD;  Location: [facility] ORS;  Service: Urology;  Laterality: N/A;  . TONSILLECTOMY    . VASECTOMY      Family Psychiatric History:  He  reported bipolar disorder in his sister and nephew.  Family History:  Family History  Problem Relation Age of Onset  . Depression Sister   . Bipolar disorder Other     Social History:   Social History   Social History  . Marital status: Divorced    Spouse name: N/A  . Number of children: N/A  . Years of education: N/A   Social History Main Topics  . Smoking status: Former Smoker    Types: Cigarettes    Quit date: 09/08/1972  . Smokeless tobacco: Never Used  . Alcohol use No  . Drug use: No  . Sexual activity: No   Other Topics Concern  . None   Social History Narrative  . None    Additional Social History:  Divorced- x 2 Lives with GF, she is 25 Retired- from [employer].    Allergies:   Allergies  Allergen Reactions  . Mirtazapine Rash  . Olanzapine Rash  . Atomoxetine Other (See Comments)    Urinary sx  . Strattera [Atomoxetine Hcl] Other (See Comments)    Urinary sx    Metabolic Disorder Labs: No results found for: HGBA1C, MPG No results found for: PROLACTIN Lab Results  Component Value Date   CHOL 184 11/11/2015   TRIG 261 (H) 11/11/2015   HDL 34 (L) 11/11/2015   CHOLHDL 4.8 09/09/2014   LDLCALC 98 11/11/2015   LDLCALC 104 (H) 09/09/2014     Current Medications: Current Outpatient Prescriptions  Medication Sig Dispense Refill  . allopurinol (ZYLOPRIM) 100 MG tablet     . aMILoride (MIDAMOR) 5 MG tablet Take 1 tablet (5 mg total) by mouth daily. 90 tablet 4  . amLODipine (NORVASC) 10 MG tablet Take 1 tablet (10 mg total) by mouth daily. 90 tablet 4  . benazepril (LOTENSIN) 40 MG tablet Take 40 mg by mouth daily.     . carvedilol (COREG) 6.25 MG tablet Take 6.25 mg by mouth 2 (two) times daily with a meal.     . febuxostat (ULORIC) 40 MG tablet Take 40 mg by mouth daily.    . fluticasone (FLONASE) 50 MCG/ACT nasal spray Place 2 sprays into both nostrils daily as needed for allergies or rhinitis.    . hydrALAZINE (APRESOLINE) 100 MG tablet Take 100 mg  by mouth 3 (three) times daily.    Marland Kitchen lithium carbonate (ESKALITH) 450 MG CR tablet Take 1 tablet (450 mg total) by mouth daily. 90 tablet 1  . Multiple Vitamin (MULTIVITAMIN) tablet Take 1 tablet by mouth daily.    . QUEtiapine (SEROQUEL) 200 MG tablet Take 1 tablet (200 mg total) by mouth at bedtime. 90 tablet 1  . VITAMIN D, CHOLECALCIFEROL, PO Take 1 tablet by mouth daily.      No current facility-administered medications for this visit.     Neurologic: Headache: No Seizure: No Paresthesias:No  Musculoskeletal: Strength & Muscle Tone: within normal limits Gait & Station: normal Patient leans: N/A  Psychiatric Specialty Exam: ROS  Blood pressure (!) 150/82, pulse (!) 58,  temperature 97.8 F (36.6 C), temperature source Oral, weight 206 lb 3.2 oz (93.5 kg).Body mass index is 32.3 kg/m.  General Appearance: Casual  Eye Contact:  Fair  Speech:  Slow  Volume:  Normal  Mood:  Anxious  Affect:  Blunt  Thought Process:  Coherent  Orientation:  Full (Time, Place, and Person)  Thought Content:  Logical  Suicidal Thoughts:  No  Homicidal Thoughts:  No  Memory:  Immediate;   Fair Recent;   Fair Remote;   Fair  Judgement:  Fair  Insight:  Fair  Psychomotor Activity:  Psychomotor Retardation  Concentration:  Concentration: Fair and Attention Span: Fair  Recall:  [employer] of Knowledge:Fair  Language: Fair  Akathisia:  No  Handed:  Right  AIMS (if indicated):    Assets:  Armed forces logistics/support/administrative officer Physical Health Social Support  ADL's:  Intact  Cognition: WNL  Sleep:  good    Treatment Plan Summary: Medication management   Continue lithium carbonate 450 mg by mouth daily. Advised patient to start taking the medication in the evening. He agreed with the plan. It will decrease the sedation. He is taking Seroquel 200 mg at bedtime. He has enough supply of medication at this time  Follow-up in one month or earlier depending on his symptoms Reviewed his records from [facility]   More than 50% of the time spent in psychoeducation, counseling and coordination of care.    This note was generated in part or whole with voice recognition software. Voice regonition is usually quite accurate but there are transcription errors that can and very often do occur. I apologize for any typographical errors that were not detected and corrected.    Rainey Pines, MD 11/2/201711:08 AM

## 2016-01-26 ENCOUNTER — Ambulatory Visit (INDEPENDENT_AMBULATORY_CARE_PROVIDER_SITE_OTHER): Payer: Medicare Other | Admitting: Family Medicine

## 2016-01-26 ENCOUNTER — Encounter: Payer: Self-pay | Admitting: Family Medicine

## 2016-01-26 VITALS — BP 152/88 | HR 57 | Temp 98.5°F | Wt 203.0 lb

## 2016-01-26 DIAGNOSIS — B9789 Other viral agents as the cause of diseases classified elsewhere: Secondary | ICD-10-CM | POA: Diagnosis not present

## 2016-01-26 DIAGNOSIS — J069 Acute upper respiratory infection, unspecified: Secondary | ICD-10-CM

## 2016-01-26 MED ORDER — AMOXICILLIN-POT CLAVULANATE 875-125 MG PO TABS: 1.0000 | ORAL_TABLET | Freq: Two times a day (BID) | ORAL | 0 refills | Status: DC

## 2016-01-26 MED ORDER — BENZONATATE 200 MG PO CAPS: 200.0000 mg | ORAL_CAPSULE | Freq: Two times a day (BID) | ORAL | 0 refills | Status: DC | PRN

## 2016-01-26 MED ORDER — IPRATROPIUM BROMIDE 0.03 % NA SOLN: 2.0000 | Freq: Two times a day (BID) | NASAL | 2 refills | Status: DC

## 2016-01-26 NOTE — Progress Notes (Signed)
   BP (!) 152/88   Pulse (!) 57   Temp 98.5 F (36.9 C)   Wt 203 lb (92.1 kg)   SpO2 98%   BMI 31.79 kg/m    Subjective:    Patient ID: Luis Miller., male    DOB: [birth year], 70 y.o.   MRN: [id number]  HPI: [name]. is a 70 y.o. male  Chief Complaint  Patient presents with  . URI    started Friday, horseness, runny nose, sneezing, head congestion. No sore throat, no chest congestion. No fever.   Patient presents with 4 day history of hoarse voice, rhinorrhea, sneezing, and congestion. Denies fever, chills, body aches, ST, N/V/D. Has not been trying anything OTC. His girlfriend has a sinus infection. Going to [employer] this weekend and wanting to make sure he's not still sick by then.   Relevant past medical, surgical, family and social history reviewed and updated as indicated. Interim medical history since our last visit reviewed. Allergies and medications reviewed and updated.  Review of Systems  Constitutional: Negative for chills, diaphoresis and fever.  HENT: Positive for congestion, postnasal drip and rhinorrhea.   Respiratory: Negative.   Cardiovascular: Negative.   Gastrointestinal: Negative.   Genitourinary: Negative.   Musculoskeletal: Negative.   Skin: Negative.   Neurological: Negative.   Psychiatric/Behavioral: Negative.     Per HPI unless specifically indicated above     Objective:    BP (!) 152/88   Pulse (!) 57   Temp 98.5 F (36.9 C)   Wt 203 lb (92.1 kg)   SpO2 98%   BMI 31.79 kg/m   Wt Readings from Last 3 Encounters:  01/26/16 203 lb (92.1 kg)  01/22/16 206 lb 3.2 oz (93.5 kg)  11/25/15 192 lb 11.2 oz (87.4 kg)    Physical Exam  Constitutional: He is oriented to person, place, and time. He appears well-developed and well-nourished.  HENT:  Head: Atraumatic.  Right Ear: External ear normal.  Left Ear: External ear normal.  Mouth/Throat: Oropharynx is clear and moist. No oropharyngeal exudate.  Eyes: Conjunctivae are  normal. Pupils are equal, round, and reactive to light. No scleral icterus.  Neck: Normal range of motion. Neck supple.  Cardiovascular: Normal rate and normal heart sounds.   Pulmonary/Chest: Effort normal and breath sounds normal. No respiratory distress. He has no wheezes. He has no rales.  Musculoskeletal: Normal range of motion.  Lymphadenopathy:    He has no cervical adenopathy.  Neurological: He is alert and oriented to person, place, and time.  Skin: Skin is warm and dry.  Psychiatric: He has a normal mood and affect. His behavior is normal.  Nursing note and vitals reviewed.     Assessment & Plan:   Problem List Items Addressed This Visit    None    Visit Diagnoses    Viral URI    -  Primary   Atrovent nasal spray for rhinorrhea, tessalon perles and augmentin sent in case no better by end of week. Tylenol and mucinex prn        Follow up plan: Return if symptoms worsen or fail to improve.

## 2016-01-26 NOTE — Patient Instructions (Signed)
Follow up as needed

## 2016-02-17 ENCOUNTER — Ambulatory Visit (INDEPENDENT_AMBULATORY_CARE_PROVIDER_SITE_OTHER): Payer: Medicare Other | Admitting: Psychiatry

## 2016-02-17 ENCOUNTER — Encounter: Payer: Self-pay | Admitting: Psychiatry

## 2016-02-17 VITALS — BP 150/86 | HR 80 | Temp 97.9°F | Wt 205.4 lb

## 2016-02-17 DIAGNOSIS — F3160 Bipolar disorder, current episode mixed, unspecified: Secondary | ICD-10-CM | POA: Diagnosis not present

## 2016-02-17 DIAGNOSIS — F3176 Bipolar disorder, in full remission, most recent episode depressed: Secondary | ICD-10-CM

## 2016-02-17 MED ORDER — QUETIAPINE FUMARATE 100 MG PO TABS: 150.0000 mg | ORAL_TABLET | Freq: Every day | ORAL | 0 refills | Status: DC

## 2016-02-17 NOTE — Progress Notes (Signed)
Psychiatric MD Progress Note   Patient Identification: Luis Miller. MRN:  [id number] Date of Evaluation:  02/17/2016 Referral Source: Golden Pop, M.D Chief Complaint:   Chief Complaint    Follow-up     Visit Diagnosis:    ICD-9-CM ICD-10-CM   1. Bipolar I disorder, most recent episode mixed ([place]) 296.60 F31.60   2. Depressed bipolar I disorder in remission ([facility]) 296.56 F31.76 QUEtiapine (SEROQUEL) 100 MG tablet    History of Present Illness:    Patient is a 70 year old divorced male with history of bipolar disorder presented for follow up. He reported that he was previously following Dr. Vallarie Mare for many years. He reported that he was looking for His records from Dr. Lucious Groves office and he found out that he is currently located in [place]. He wasn't able to find his records. Patient reported that he continues to feel sleepy. He has tried to switch his dose of lithium to bedtime. He still does off while sitting. Patient reported that his girlfriend is very supportive. He went to [facility] for a week and enjoyed this time. He is planning to go to [facility] around the Christmas holidays. Patient reported that he has been compliant with his medications. He stated that the medications help with his mood symptoms. He currently denied having any suicidal ideations or plans. Patient reported that he feels that the Seroquel might be causing him to sleep a lot. We discussed about decreasing the dose of Seroquel and he agreed with the plan.  He appeared well groomed during the interview. He denied having any suicidal homicidal ideations or plans. He denied having any perceptual disturbances. He stated that he has a new relationship for the past 6 months and he is doing line dancing every Friday evening. He stated that he feels physically active.  He has a strong family history of bipolar as well.  Associated Signs/Symptoms: Depression Symptoms:  psychomotor retardation, fatigue, loss of  energy/fatigue, (Hypo) Manic Symptoms:  denied Anxiety Symptoms:  Excessive Worry, Psychotic Symptoms:  none PTSD Symptoms: Negative NA  Past Psychiatric History:  Long history of bipolar disorder. He reported that he has seen Dr. Vallarie Mare for many years in the past. ECT Treatment - several  Psychiatric Admission x 4, [facility], [facility], [place], [place] No h/o Suicide attemtps.   Previous Psychotropic Medications:  Lithium Mellaril Seroquel Does not remember the names.   Allergies- Remeron Olanzapine Strattera   Substance Abuse History in the last 12 months:  No.  Consequences of Substance Abuse: Negative NA  Past Medical History:  Past Medical History:  Diagnosis Date  . Anemia   . Apnea, sleep   . Bipolar affective ([place])   . Cancer ([place]) malignant melanoma  . Crohn's disease ([place])   . Depression   . Diabetes insipidus ([place])   . Erectile dysfunction   . Hyperlipidemia   . Hypertension   . IBS (irritable bowel syndrome)   . Impotence   . Internal hemorrhoids   . 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[place])   . Paraphimosis   . Peyronie disease   . Secondary hyperparathyroidism of renal origin ([place])   . Skin cancer   . Testicular hypofunction   . Urinary frequency   . Urinary hesitancy     Past Surgical History:  Procedure Laterality Date  . arm fracture    . BONE MARROW BIOPSY    . COLONOSCOPY WITH PROPOFOL N/A 12/23/2014   Procedure: COLONOSCOPY WITH PROPOFOL;  Surgeon: Manya Silvas, MD;  Location:  [facility];  Service: Endoscopy;  Laterality: N/A;  . MELANOMA EXCISION    . PENILE PROSTHESIS IMPLANT    . PENILE PROSTHESIS IMPLANT N/A 08/25/2015   Procedure: REPLACEMENT OF INFLATABLE PENILE PROSTHESIS COMPONENTS;  Surgeon: Cleon Gustin, MD;  Location: [facility];  Service: Urology;  Laterality: N/A;  . TONSILLECTOMY    . VASECTOMY      Family Psychiatric History:  He reported bipolar disorder in his sister and nephew.  Family History:  Family History   Problem Relation Age of Onset  . Depression Sister   . Bipolar disorder Other     Social History:   Social History   Social History  . Marital status: Divorced    Spouse name: N/A  . Number of children: N/A  . Years of education: N/A   Social History Main Topics  . Smoking status: Former Smoker    Types: Cigarettes    Quit date: 09/08/1972  . Smokeless tobacco: Never Used  . Alcohol use No  . Drug use: No  . Sexual activity: No   Other Topics Concern  . None   Social History Narrative  . None    Additional Social History:  Divorced- x 2 Lives with GF, she is 35 Retired- from [employer].    Allergies:   Allergies  Allergen Reactions  . Mirtazapine Rash  . Olanzapine Rash  . Atomoxetine Other (See Comments)    Urinary sx  . Strattera [Atomoxetine Hcl] Other (See Comments)    Urinary sx    Metabolic Disorder Labs: No results found for: HGBA1C, MPG No results found for: PROLACTIN Lab Results  Component Value Date   CHOL 184 11/11/2015   TRIG 261 (H) 11/11/2015   HDL 34 (L) 11/11/2015   CHOLHDL 4.8 09/09/2014   LDLCALC 98 11/11/2015   LDLCALC 104 (H) 09/09/2014     Current Medications: Current Outpatient Prescriptions  Medication Sig Dispense Refill  . allopurinol (ZYLOPRIM) 100 MG tablet Take 100 mg by mouth daily.     Marland Kitchen aMILoride (MIDAMOR) 5 MG tablet Take 1 tablet (5 mg total) by mouth daily. 90 tablet 4  . amLODipine (NORVASC) 10 MG tablet Take 1 tablet (10 mg total) by mouth daily. 90 tablet 4  . amoxicillin-clavulanate (AUGMENTIN) 875-125 MG tablet Take 1 tablet by mouth 2 (two) times daily. 14 tablet 0  . benazepril (LOTENSIN) 40 MG tablet Take 40 mg by mouth daily.     . benzonatate (TESSALON) 200 MG capsule Take 1 capsule (200 mg total) by mouth 2 (two) times daily as needed for cough. 20 capsule 0  . carvedilol (COREG) 6.25 MG tablet Take 6.25 mg by mouth 2 (two) times daily with a meal.     . fluticasone (FLONASE) 50 MCG/ACT nasal spray Place 2  sprays into both nostrils daily as needed for allergies or rhinitis.    . hydrALAZINE (APRESOLINE) 100 MG tablet Take 100 mg by mouth 3 (three) times daily.    Marland Kitchen ipratropium (ATROVENT) 0.03 % nasal spray Place 2 sprays into both nostrils every 12 (twelve) hours. 30 mL 2  . lithium carbonate (ESKALITH) 450 MG CR tablet Take 1 tablet (450 mg total) by mouth daily. 90 tablet 1  . Multiple Vitamin (MULTIVITAMIN) tablet Take 1 tablet by mouth daily.    . QUEtiapine (SEROQUEL) 100 MG tablet Take 1.5 tablets (150 mg total) by mouth at bedtime. 135 tablet 0  . VITAMIN D, CHOLECALCIFEROL, PO Take 1 tablet by mouth daily.  No current facility-administered medications for this visit.     Neurologic: Headache: No Seizure: No Paresthesias:No  Musculoskeletal: Strength & Muscle Tone: within normal limits Gait & Station: normal Patient leans: N/A  Psychiatric Specialty Exam: ROS  Blood pressure (!) 150/86, pulse 80, temperature 97.9 F (36.6 C), temperature source Tympanic, weight 205 lb 6.4 oz (93.2 kg), SpO2 98 %.Body mass index is 32.17 kg/m.  General Appearance: Casual  Eye Contact:  Fair  Speech:  Slow  Volume:  Normal  Mood:  Anxious  Affect:  Blunt  Thought Process:  Coherent  Orientation:  Full (Time, Place, and Person)  Thought Content:  Logical  Suicidal Thoughts:  No  Homicidal Thoughts:  No  Memory:  Immediate;   Fair Recent;   Fair Remote;   Fair  Judgement:  Fair  Insight:  Fair  Psychomotor Activity:  Psychomotor Retardation  Concentration:  Concentration: Fair and Attention Span: Fair  Recall:  [employer] of Knowledge:Fair  Language: Fair  Akathisia:  No  Handed:  Right  AIMS (if indicated):    Assets:  Armed forces logistics/support/administrative officer Physical Health Social Support  ADL's:  Intact  Cognition: WNL  Sleep:  good    Treatment Plan Summary: Medication management   Continue lithium carbonate 450 mg Daily at bedtime  He is taking Seroquel 200 mg at bedtime. I will  advise him to decrease the dose 100 mg. He agreed with the plan. I will reorder the Seroquel at this time. He was take between 100-150.   Follow-up in 2 month or earlier depending on his symptoms    More than 50% of the time spent in psychoeducation, counseling and coordination of care.    This note was generated in part or whole with voice recognition software. Voice regonition is usually quite accurate but there are transcription errors that can and very often do occur. I apologize for any typographical errors that were not detected and corrected.    Rainey Pines, MD 11/28/20171:45 PM

## 2016-02-24 ENCOUNTER — Ambulatory Visit (INDEPENDENT_AMBULATORY_CARE_PROVIDER_SITE_OTHER): Payer: Medicare Other | Admitting: Urology

## 2016-02-24 VITALS — BP 132/65 | HR 60 | Ht 68.0 in | Wt 206.0 lb

## 2016-02-24 DIAGNOSIS — N5201 Erectile dysfunction due to arterial insufficiency: Secondary | ICD-10-CM

## 2016-02-24 NOTE — Progress Notes (Signed)
02/24/2016 11:21 AM   Maximiano Coss [birth year] [id number]  Referring provider: Guadalupe Maple, MD [address] [place], [place] [address]  Chief Complaint  Patient presents with  . Follow-up    erectile dysfunction    HPI: Mr Paulino is a 70yo seen in followup for ED. He had an IPP replacement in 08/2015 for a malfunctioning IPP. He has mild curvature with the new pump. He has been using the IPP without issue. He has mild glans numbness. He has difficulty with delayed ejaculation     PMH: Past Medical History:  Diagnosis Date  . Anemia   . Apnea, sleep   . Bipolar affective ([place])   . Cancer ([place]) malignant melanoma  . Crohn's disease ([place])   . Depression   . Diabetes insipidus ([place])   . Erectile dysfunction   . Hyperlipidemia   . Hypertension   . IBS (irritable bowel syndrome)   . Impotence   . Internal hemorrhoids   . 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[place])   . Paraphimosis   . Peyronie disease   . Secondary hyperparathyroidism of renal origin ([place])   . Skin cancer   . Testicular hypofunction   . Urinary frequency   . Urinary hesitancy     Surgical History: Past Surgical History:  Procedure Laterality Date  . arm fracture    . BONE MARROW BIOPSY    . COLONOSCOPY WITH PROPOFOL N/A 12/23/2014   Procedure: COLONOSCOPY WITH PROPOFOL;  Surgeon: Manya Silvas, MD;  Location: [facility];  Service: Endoscopy;  Laterality: N/A;  . MELANOMA EXCISION    . PENILE PROSTHESIS IMPLANT    . PENILE PROSTHESIS IMPLANT N/A 08/25/2015   Procedure: REPLACEMENT OF INFLATABLE PENILE PROSTHESIS COMPONENTS;  Surgeon: Cleon Gustin, MD;  Location: [facility];  Service: Urology;  Laterality: N/A;  . TONSILLECTOMY    . VASECTOMY      Home Medications:    Medication List       Accurate as of 02/24/16 11:21 AM. Always use your most recent med list.          allopurinol 100 MG tablet Commonly known as:  ZYLOPRIM Take 100 mg by mouth daily.   aMILoride 5 MG  tablet Commonly known as:  MIDAMOR Take 1 tablet (5 mg total) by mouth daily.   amLODipine 10 MG tablet Commonly known as:  NORVASC Take 1 tablet (10 mg total) by mouth daily.   benazepril 40 MG tablet Commonly known as:  LOTENSIN Take 40 mg by mouth daily.   benzonatate 200 MG capsule Commonly known as:  TESSALON Take 1 capsule (200 mg total) by mouth 2 (two) times daily as needed for cough.   carvedilol 6.25 MG tablet Commonly known as:  COREG Take 6.25 mg by mouth 2 (two) times daily with a meal.   fluticasone 50 MCG/ACT nasal spray Commonly known as:  FLONASE Place 2 sprays into both nostrils daily as needed for allergies or rhinitis.   hydrALAZINE 100 MG tablet Commonly known as:  APRESOLINE Take 100 mg by mouth 3 (three) times daily.   ipratropium 0.03 % nasal spray Commonly known as:  ATROVENT Place 2 sprays into both nostrils every 12 (twelve) hours.   lithium carbonate 450 MG CR tablet Commonly known as:  ESKALITH Take 1 tablet (450 mg total) by mouth daily.   multivitamin tablet Take 1 tablet by mouth daily.   QUEtiapine 100 MG tablet Commonly known as:  SEROQUEL Take 1.5 tablets (150 mg  total) by mouth at bedtime.   VITAMIN D (CHOLECALCIFEROL) PO Take 1 tablet by mouth daily.       Allergies:  Allergies  Allergen Reactions  . Mirtazapine Rash  . Olanzapine Rash  . Atomoxetine Other (See Comments)    Urinary sx  . Strattera [Atomoxetine Hcl] Other (See Comments)    Urinary sx    Family History: Family History  Problem Relation Age of Onset  . Depression Sister   . Bipolar disorder Other     Social History:  reports that he quit smoking about 43 years ago. His smoking use included Cigarettes. He has never used smokeless tobacco. He reports that he does not drink alcohol or use drugs.  ROS:                                        Physical Exam: BP 132/65   Pulse 60   Ht 5' 8"  (1.727 m)   Wt 93.4 kg (206 lb)    BMI 31.32 kg/m   Constitutional:  Alert and oriented, No acute distress. HEENT: [place] AT, moist mucus membranes.  Trachea midline, no masses. Cardiovascular: No clubbing, cyanosis, or edema. Respiratory: Normal respiratory effort, no increased work of breathing. GI: Abdomen is soft, nontender, nondistended, no abdominal masses GU: No CVA tenderness.  Skin: No rashes, bruises or suspicious lesions. Lymph: No cervical or inguinal adenopathy. Neurologic: Grossly intact, no focal deficits, moving all 4 extremities. Psychiatric: Normal mood and affect.  Laboratory Data: Lab Results  Component Value Date   WBC 3.6 11/11/2015   HGB 11.8 (L) 08/26/2015   HCT 36.5 (L) 11/11/2015   MCV 100 (H) 11/11/2015   PLT 141 (L) 11/11/2015    Lab Results  Component Value Date   CREATININE 2.21 (H) 11/11/2015    No results found for: PSA  No results found for: TESTOSTERONE  No results found for: HGBA1C  Urinalysis    Component Value Date/Time   APPEARANCEUR Clear 11/11/2015 0916   GLUCOSEU Negative 11/11/2015 0916   BILIRUBINUR Negative 11/11/2015 0916   PROTEINUR Trace 11/11/2015 0916   NITRITE Negative 11/11/2015 0916   LEUKOCYTESUR Negative 11/11/2015 0916    Pertinent Imaging: none  Assessment & Plan:    1. Erectile dysfunction due to arterial insufficiency -RTC 6 months   No Follow-up on file.  Nicolette Bang, MD  [facility] [address] [place], [place] [address] [phone]

## 2016-02-26 ENCOUNTER — Encounter: Payer: Self-pay | Admitting: Family Medicine

## 2016-02-26 ENCOUNTER — Ambulatory Visit (INDEPENDENT_AMBULATORY_CARE_PROVIDER_SITE_OTHER): Payer: Medicare Other | Admitting: Family Medicine

## 2016-02-26 DIAGNOSIS — M10372 Gout due to renal impairment, left ankle and foot: Secondary | ICD-10-CM | POA: Diagnosis not present

## 2016-02-26 DIAGNOSIS — M109 Gout, unspecified: Secondary | ICD-10-CM | POA: Insufficient documentation

## 2016-02-26 MED ORDER — COLCHICINE 0.6 MG PO CAPS: 0.3000 mg | ORAL_CAPSULE | Freq: Every morning | ORAL | 3 refills | Status: DC

## 2016-02-26 NOTE — Assessment & Plan Note (Signed)
Reviewed patient's notes from nephrology and labs will continue allopurinol 100 mg. Patient is changing insurance next year and will look into starting Uloric. We'll check uric acid levels today. Will start colchicine 0.3 mg 2 today then one a day. Follow-up in one month to assess renal status and uric acid.

## 2016-02-26 NOTE — Progress Notes (Signed)
BP (!) 166/76 (BP Location: Left Arm, Patient Position: Sitting, Cuff Size: Normal)   Pulse (!) 55   Temp 97.8 F (36.6 C)   Ht 5\' 8"  (1.727 m)   Wt 205 lb (93 kg)   SpO2 100%   BMI 31.17 kg/m    Subjective:    Patient ID: Luis Coss., male    DOB: 1945/05/26, 70 y.o.   MRN: [id number]  HPI: Luis Granlund. is a 70 y.o. male  Chief Complaint  Patient presents with  . Foot Pain    Left foot pain. On Allopurinol. Not working for right foot.   Patient with complaints of gout flare was in his right great toe now in his left great toe was originally given Uloric but due to cost was unable to get and is on allopurinol 100 mg. Renal status is stable reviewed notes from urology and patient in stage III CK D.  Has been taken allopurinol without problems.   Relevant past medical, surgical, family and social history reviewed and updated as indicated. Interim medical history since our last visit reviewed. Allergies and medications reviewed and updated.  Review of Systems  Constitutional: Negative.   Respiratory: Negative.   Cardiovascular: Negative.     Per HPI unless specifically indicated above     Objective:    BP (!) 166/76 (BP Location: Left Arm, Patient Position: Sitting, Cuff Size: Normal)   Pulse (!) 55   Temp 97.8 F (36.6 C)   Ht 5\' 8"  (1.727 m)   Wt 205 lb (93 kg)   SpO2 100%   BMI 31.17 kg/m   Wt Readings from Last 3 Encounters:  02/26/16 205 lb (93 kg)  02/24/16 206 lb (93.4 kg)  01/26/16 203 lb (92.1 kg)    Physical Exam  Constitutional: He is oriented to person, place, and time. He appears well-developed and well-nourished. No distress.  HENT:  Head: Normocephalic and atraumatic.  Right Ear: Hearing normal.  Left Ear: Hearing normal.  Nose: Nose normal.  Eyes: Conjunctivae and lids are normal. Right eye exhibits no discharge. Left eye exhibits no discharge. No scleral icterus.  Pulmonary/Chest: Effort normal. No respiratory distress.    Musculoskeletal: Normal range of motion.  Left bunion inflamed tender to touch and swollen and red.  Neurological: He is alert and oriented to person, place, and time.  Skin: Skin is intact. No rash noted.  Psychiatric: He has a normal mood and affect. His speech is normal and behavior is normal. Judgment and thought content normal. Cognition and memory are normal.    Results for orders placed or performed in visit on 11/11/15  CBC with Differential/Platelet  Result Value Ref Range   WBC 3.6 3.4 - 10.8 x10E3/uL   RBC 3.67 (L) 4.14 - 5.80 x10E6/uL   Hemoglobin 12.4 (L) 12.6 - 17.7 g/dL   Hematocrit 36.5 (L) 37.5 - 51.0 %   MCV 100 (H) 79 - 97 fL   MCH 33.8 (H) 26.6 - 33.0 pg   MCHC 34.0 31.5 - 35.7 g/dL   RDW 13.8 12.3 - 15.4 %   Platelets 141 (L) 150 - 379 x10E3/uL   Neutrophils 65 %   Lymphs 18 %   Monocytes 10 %   Eos 5 %   Basos 1 %   Neutrophils Absolute 2.4 1.4 - 7.0 x10E3/uL   Lymphocytes Absolute 0.6 (L) 0.7 - 3.1 x10E3/uL   Monocytes Absolute 0.3 0.1 - 0.9 x10E3/uL   EOS (ABSOLUTE) 0.2 0.0 -  0.4 x10E3/uL   Basophils Absolute 0.0 0.0 - 0.2 x10E3/uL   Immature Granulocytes 1 %   Immature Grans (Abs) 0.0 0.0 - 0.1 x10E3/uL  Comprehensive metabolic panel  Result Value Ref Range   Glucose 102 (H) 65 - 99 mg/dL   BUN 26 8 - 27 mg/dL   Creatinine, Ser 2.21 (H) 0.76 - 1.27 mg/dL   GFR calc non Af Amer 29 (L) >59 mL/min/1.73   GFR calc Af Amer 33 (L) >59 mL/min/1.73   BUN/Creatinine Ratio 12 10 - 24   Sodium 149 (H) 134 - 144 mmol/L   Potassium 4.3 3.5 - 5.2 mmol/L   Chloride 112 (H) 96 - 106 mmol/L   CO2 22 18 - 29 mmol/L   Calcium 9.4 8.6 - 10.2 mg/dL   Total Protein 6.5 6.0 - 8.5 g/dL   Albumin 4.1 3.5 - 4.8 g/dL   Globulin, Total 2.4 1.5 - 4.5 g/dL   Albumin/Globulin Ratio 1.7 1.2 - 2.2   Bilirubin Total 0.4 0.0 - 1.2 mg/dL   Alkaline Phosphatase 61 39 - 117 IU/L   AST 15 0 - 40 IU/L   ALT 14 0 - 44 IU/L  Lipid Panel w/o Chol/HDL Ratio  Result Value Ref Range    Cholesterol, Total 184 100 - 199 mg/dL   Triglycerides 261 (H) 0 - 149 mg/dL   HDL 34 (L) >39 mg/dL   VLDL Cholesterol Cal 52 (H) 5 - 40 mg/dL   LDL Calculated 98 0 - 99 mg/dL  PSA  Result Value Ref Range   Prostate Specific Ag, Serum 6.4 (H) 0.0 - 4.0 ng/mL  TSH  Result Value Ref Range   TSH 0.751 0.450 - 4.500 uIU/mL  Urinalysis, Routine w reflex microscopic (not at [facility])  Result Value Ref Range   Specific Gravity, UA <1.005 (L) 1.005 - 1.030   pH, UA 5.5 5.0 - 7.5   Color, UA Yellow Yellow   Appearance Ur Clear Clear   Leukocytes, UA Negative Negative   Protein, UA Trace Negative/Trace   Glucose, UA Negative Negative   Ketones, UA Negative Negative   RBC, UA Negative Negative   Bilirubin, UA Negative Negative   Urobilinogen, Ur 0.2 0.2 - 1.0 mg/dL   Nitrite, UA Negative Negative  Uric acid  Result Value Ref Range   Uric Acid 9.2 (H) 3.7 - 8.6 mg/dL  Lithium level  Result Value Ref Range   Lithium Lvl 0.7 0.6 - 1.2 mmol/L  Specimen status report  Result Value Ref Range   specimen status report Comment       Assessment & Plan:   Problem List Items Addressed This Visit      Other   Gout    Reviewed patient's notes from nephrology and labs will continue allopurinol 100 mg. Patient is changing insurance next year and will look into starting Uloric. We'll check uric acid levels today. Will start colchicine 0.3 mg 2 today then one a day. Follow-up in one month to assess renal status and uric acid.      Relevant Medications   Colchicine 0.6 MG CAPS   Other Relevant Orders   Basic metabolic panel   Uric acid       Follow up plan: Return in about 4 weeks (around 03/25/2016) for BMP uric acid.

## 2016-02-27 LAB — BASIC METABOLIC PANEL
BUN / CREAT RATIO: 13 (ref 10–24)
BUN: 27 mg/dL (ref 8–27)
CHLORIDE: 110 mmol/L — AB (ref 96–106)
CO2: 23 mmol/L (ref 18–29)
Calcium: 9.6 mg/dL (ref 8.6–10.2)
Creatinine, Ser: 2.11 mg/dL — ABNORMAL HIGH (ref 0.76–1.27)
GFR calc non Af Amer: 31 mL/min/{1.73_m2} — ABNORMAL LOW (ref >59)
GFR, EST AFRICAN AMERICAN: 35 mL/min/{1.73_m2} — AB (ref >59)
GLUCOSE: 94 mg/dL (ref 65–99)
Potassium: 4.7 mmol/L (ref 3.5–5.2)
SODIUM: 146 mmol/L — AB (ref 134–144)

## 2016-02-27 LAB — URIC ACID: URIC ACID: 4.2 mg/dL (ref 3.7–8.6)

## 2016-03-01 ENCOUNTER — Encounter: Payer: Self-pay | Admitting: Family Medicine

## 2016-03-04 ENCOUNTER — Telehealth: Payer: Self-pay | Admitting: Family Medicine

## 2016-03-04 NOTE — Telephone Encounter (Signed)
Patient states he received a letter in the mail regarding his results and he has been taking medication for gout but per the letter he does not have gout.  He is wanting to know if he needs to continue taking the medication prescribed for his gout or stop it.  Thank you Santiago Glad

## 2016-03-05 NOTE — Telephone Encounter (Signed)
Called and told patient to continue current regimen and what was discussed with Dr. Jeananne Rama until he gets back on Monday.   If Dr. Jeananne Rama wants him to stop the cochicine because his levels were normal and he's taking allopurinol, we'd call him.

## 2016-03-08 NOTE — Telephone Encounter (Signed)
Patient not available today, will try tomorrow.

## 2016-03-08 NOTE — Telephone Encounter (Signed)
Call pt 

## 2016-03-09 NOTE — Telephone Encounter (Signed)
Phone call Discussed with patient about gout uric acid will check uric acid next office visit to just assess continue colchicine for another couple weeks then discontinue.

## 2016-03-09 NOTE — Telephone Encounter (Signed)
Phone call

## 2016-04-16 ENCOUNTER — Encounter: Payer: Self-pay | Admitting: Psychiatry

## 2016-04-16 ENCOUNTER — Ambulatory Visit (INDEPENDENT_AMBULATORY_CARE_PROVIDER_SITE_OTHER): Payer: Medicare Other | Admitting: Psychiatry

## 2016-04-16 VITALS — BP 120/72 | HR 62 | Temp 97.6°F | Wt 205.8 lb

## 2016-04-16 DIAGNOSIS — F3176 Bipolar disorder, in full remission, most recent episode depressed: Secondary | ICD-10-CM | POA: Diagnosis not present

## 2016-04-16 DIAGNOSIS — F3160 Bipolar disorder, current episode mixed, unspecified: Secondary | ICD-10-CM | POA: Diagnosis not present

## 2016-04-16 MED ORDER — QUETIAPINE FUMARATE 100 MG PO TABS: 50.0000 mg | ORAL_TABLET | Freq: Every day | ORAL | 0 refills | Status: DC

## 2016-04-16 MED ORDER — LITHIUM CARBONATE ER 450 MG PO TBCR: 450.0000 mg | EXTENDED_RELEASE_TABLET | Freq: Every day | ORAL | 1 refills | Status: DC

## 2016-04-16 NOTE — Progress Notes (Signed)
Psychiatric MD Progress Note   Patient Identification: Luis Miller. MRN:  [id number] Date of Evaluation:  04/16/2016 Referral Source: Golden Pop, M.D Chief Complaint:   Chief Complaint    Follow-up; Medication Refill     Visit Diagnosis:    ICD-9-CM ICD-10-CM   1. Bipolar I disorder, most recent episode mixed ([place]) 296.60 F31.60   2. Depressed bipolar I disorder in remission ([facility]) 296.56 F31.76 QUEtiapine (SEROQUEL) 100 MG tablet    History of Present Illness:    Patient is a 71 year old divorced male with history of bipolar disorder presented for follow up. He stated that he has a decreasing the dose of Seroquel to 100 mg and has not noticed any improvement in his symptoms. He continues to feel drowsy when he wakes up in the morning and will take him a while to get alert. He reported that he usually takes the Seroquel around 11 PM. He reported that he wakes up at 7 AM in the morning although he is still tired. He reported that his girlfriend was sick with flu. Patient reported that he been going dancing for the past week as she was sick. He is willing to decrease the dose of Seroquel at this time. He appears somewhat sleepy during the interview. He currently denied having any suicidal homicidal ideations or plans. We discussed about his medications in detail. He is willing to have his medications adjusted at this time.   He appeared well groomed during the interview. He stated that he feels physically active planning to go to the gym on a regular basis.   Associated Signs/Symptoms: Depression Symptoms:  psychomotor retardation, fatigue, loss of energy/fatigue, (Hypo) Manic Symptoms:  denied Anxiety Symptoms:  Excessive Worry, Psychotic Symptoms:  none PTSD Symptoms: Negative NA  Past Psychiatric History:  Long history of bipolar disorder. He reported that he has seen Dr. Vallarie Mare for many years in the past. ECT Treatment - several  Psychiatric Admission x 4, [facility], [facility],  [place], [place] No h/o Suicide attemtps.   Previous Psychotropic Medications:  Lithium Mellaril Seroquel Does not remember the names.   Allergies- Remeron Olanzapine Strattera   Substance Abuse History in the last 12 months:  No.  Consequences of Substance Abuse: Negative NA  Past Medical History:  Past Medical History:  Diagnosis Date  . Anemia   . Apnea, sleep   . Bipolar affective ([place])   . Cancer ([place]) malignant melanoma  . Crohn's disease ([place])   . Depression   . Diabetes insipidus ([place])   . Erectile dysfunction   . Hyperlipidemia   . Hypertension   . IBS (irritable bowel syndrome)   . Impotence   . Internal hemorrhoids   . 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[place])   . Paraphimosis   . Peyronie disease   . Secondary hyperparathyroidism of renal origin ([place])   . Skin cancer   . Testicular hypofunction   . Urinary frequency   . Urinary hesitancy     Past Surgical History:  Procedure Laterality Date  . arm fracture    . BONE MARROW BIOPSY    . COLONOSCOPY WITH PROPOFOL N/A 12/23/2014   Procedure: COLONOSCOPY WITH PROPOFOL;  Surgeon: Manya Silvas, MD;  Location: [facility] ENDOSCOPY;  Service: Endoscopy;  Laterality: N/A;  . MELANOMA EXCISION    . PENILE PROSTHESIS IMPLANT    . PENILE PROSTHESIS IMPLANT N/A 08/25/2015   Procedure: REPLACEMENT OF INFLATABLE PENILE PROSTHESIS COMPONENTS;  Surgeon: Cleon Gustin, MD;  Location: [facility] ORS;  Service:  Urology;  Laterality: N/A;  . TONSILLECTOMY    . VASECTOMY      Family Psychiatric History:  He reported bipolar disorder in his sister and nephew.  Family History:  Family History  Problem Relation Age of Onset  . Depression Sister   . Bipolar disorder Other     Social History:   Social History   Social History  . Marital status: Divorced    Spouse name: N/A  . Number of children: N/A  . Years of education: N/A   Social History Main Topics  . Smoking status: Former Smoker    Types: Cigarettes     Quit date: 09/08/1972  . Smokeless tobacco: Never Used  . Alcohol use No  . Drug use: No  . Sexual activity: No   Other Topics Concern  . None   Social History Narrative  . None    Additional Social History:  Divorced- x 2 Lives with GF, she is 55 Retired- from [employer].    Allergies:   Allergies  Allergen Reactions  . Mirtazapine Rash  . Olanzapine Rash  . Atomoxetine Other (See Comments)    Urinary sx  . Strattera [Atomoxetine Hcl] Other (See Comments)    Urinary sx    Metabolic Disorder Labs: No results found for: HGBA1C, MPG No results found for: PROLACTIN Lab Results  Component Value Date   CHOL 184 11/11/2015   TRIG 261 (H) 11/11/2015   HDL 34 (L) 11/11/2015   CHOLHDL 4.8 09/09/2014   LDLCALC 98 11/11/2015   LDLCALC 104 (H) 09/09/2014     Current Medications: Current Outpatient Prescriptions  Medication Sig Dispense Refill  . allopurinol (ZYLOPRIM) 100 MG tablet Take 100 mg by mouth daily.     Marland Kitchen aMILoride (MIDAMOR) 5 MG tablet Take 1 tablet (5 mg total) by mouth daily. 90 tablet 4  . amLODipine (NORVASC) 10 MG tablet Take 1 tablet (10 mg total) by mouth daily. 90 tablet 4  . benazepril (LOTENSIN) 40 MG tablet Take 40 mg by mouth daily.     . carvedilol (COREG) 6.25 MG tablet Take 6.25 mg by mouth 2 (two) times daily with a meal.     . Colchicine 0.6 MG CAPS Take 0.3 mg by mouth every morning. Take 2 today 30 capsule 3  . hydrALAZINE (APRESOLINE) 100 MG tablet Take 100 mg by mouth 3 (three) times daily.    Marland Kitchen lithium carbonate (ESKALITH) 450 MG CR tablet Take 1 tablet (450 mg total) by mouth daily. 90 tablet 1  . Multiple Vitamin (MULTIVITAMIN) tablet Take 1 tablet by mouth daily.    . QUEtiapine (SEROQUEL) 100 MG tablet Take 0.5 tablets (50 mg total) by mouth at bedtime. 135 tablet 0  . VITAMIN D, CHOLECALCIFEROL, PO Take 1 tablet by mouth daily.      No current facility-administered medications for this visit.     Neurologic: Headache: No Seizure:  No Paresthesias:No  Musculoskeletal: Strength & Muscle Tone: within normal limits Gait & Station: normal Patient leans: N/A  Psychiatric Specialty Exam: ROS  Blood pressure 120/72, pulse 62, temperature 97.6 F (36.4 C), temperature source Oral, weight 205 lb 12.8 oz (93.4 kg).Body mass index is 31.29 kg/m.  General Appearance: Casual  Eye Contact:  Fair  Speech:  Slow  Volume:  Normal  Mood:  Anxious  Affect:  Blunt  Thought Process:  Coherent  Orientation:  Full (Time, Place, and Person)  Thought Content:  Logical  Suicidal Thoughts:  No  Homicidal Thoughts:  No  Memory:  Immediate;   Fair Recent;   Fair Remote;   Fair  Judgement:  Fair  Insight:  Fair  Psychomotor Activity:  Psychomotor Retardation  Concentration:  Concentration: Fair and Attention Span: Fair  Recall:  [employer] of Knowledge:Fair  Language: Fair  Akathisia:  No  Handed:  Right  AIMS (if indicated):    Assets:  Armed forces logistics/support/administrative officer Physical Health Social Support  ADL's:  Intact  Cognition: WNL  Sleep:  good    Treatment Plan Summary: Medication management   Continue lithium carbonate 450 mg Daily at bedtime  He  will taking Seroquel 50 mg at bedtime to decrease the sedation. He agreed with the plan. Patient will follow up in 2 months or until depending on his symptoms. He has refills of Seroquel available at this time. Follow-up in 2 month or earlier depending on his symptoms    More than 50% of the time spent in psychoeducation, counseling and coordination of care.    This note was generated in part or whole with voice recognition software. Voice regonition is usually quite accurate but there are transcription errors that can and very often do occur. I apologize for any typographical errors that were not detected and corrected.    Rainey Pines, MD 1/26/201810:41 AM

## 2016-04-27 DIAGNOSIS — N2581 Secondary hyperparathyroidism of renal origin: Secondary | ICD-10-CM | POA: Diagnosis not present

## 2016-04-27 DIAGNOSIS — N183 Chronic kidney disease, stage 3 (moderate): Secondary | ICD-10-CM | POA: Diagnosis not present

## 2016-04-27 DIAGNOSIS — I10 Essential (primary) hypertension: Secondary | ICD-10-CM | POA: Diagnosis not present

## 2016-04-27 DIAGNOSIS — E232 Diabetes insipidus: Secondary | ICD-10-CM | POA: Diagnosis not present

## 2016-05-04 DIAGNOSIS — L570 Actinic keratosis: Secondary | ICD-10-CM | POA: Diagnosis not present

## 2016-05-04 DIAGNOSIS — R208 Other disturbances of skin sensation: Secondary | ICD-10-CM | POA: Diagnosis not present

## 2016-05-04 DIAGNOSIS — X32XXXA Exposure to sunlight, initial encounter: Secondary | ICD-10-CM | POA: Diagnosis not present

## 2016-05-04 DIAGNOSIS — L820 Inflamed seborrheic keratosis: Secondary | ICD-10-CM | POA: Diagnosis not present

## 2016-05-04 DIAGNOSIS — L538 Other specified erythematous conditions: Secondary | ICD-10-CM | POA: Diagnosis not present

## 2016-05-10 DIAGNOSIS — H2513 Age-related nuclear cataract, bilateral: Secondary | ICD-10-CM | POA: Diagnosis not present

## 2016-05-24 DIAGNOSIS — M25562 Pain in left knee: Secondary | ICD-10-CM | POA: Diagnosis not present

## 2016-05-24 DIAGNOSIS — M25561 Pain in right knee: Secondary | ICD-10-CM | POA: Diagnosis not present

## 2016-05-24 DIAGNOSIS — M1712 Unilateral primary osteoarthritis, left knee: Secondary | ICD-10-CM | POA: Diagnosis not present

## 2016-05-26 DIAGNOSIS — L111 Transient acantholytic dermatosis [Grover]: Secondary | ICD-10-CM | POA: Diagnosis not present

## 2016-05-26 DIAGNOSIS — L821 Other seborrheic keratosis: Secondary | ICD-10-CM | POA: Diagnosis not present

## 2016-05-31 ENCOUNTER — Other Ambulatory Visit: Payer: Medicare Other

## 2016-05-31 DIAGNOSIS — R972 Elevated prostate specific antigen [PSA]: Secondary | ICD-10-CM | POA: Diagnosis not present

## 2016-06-01 LAB — PSA: Prostate Specific Ag, Serum: 6.4 ng/mL — ABNORMAL HIGH (ref 0.0–4.0)

## 2016-06-02 NOTE — Progress Notes (Signed)
06/03/2016 10:41 AM   Luis Miller. [birth year] [id number]  Referring provider: Guadalupe Maple, MD [address] [place], [place] [address]  Chief Complaint  Patient presents with  . Benign Prostatic Hypertrophy    1 year follow up    HPI: 71 yo WM with an elevated PSA, ED and BPH with LU TS who presents today for a one year follow up.  Elevated PSA Patient had a negative bx in 2002 for a PSA of 4.03 ng/mL.  His current PSA is 6.4 ng/mL on 05/31/2016.  He has had a < 0.75 ng/mL rise per year.     Erectile dysfunction IPP placement for a malfunction IPP on 08/25/2015.  He is experiencing ejaculatory disorders.  He only ejaculates on occasion.  He is having orgasms.    BPH WITH LUTS His IPSS score today is 4, which is mild lower urinary tract symptomatology. He is delighted with his quality life due to his urinary symptoms.  His previous I PSS score was 5/2.  He denies any dysuria, hematuria or suprapubic pain.  He also denies any recent fevers, chills, nausea or vomiting.  He is adopted.       IPSS    Row Name 06/03/16 1000         International Prostate Symptom Score   How often have you had the sensation of not emptying your bladder? Not at All     How often have you had to urinate less than every two hours? Less than half the time     How often have you found you stopped and started again several times when you urinated? Not at All     How often have you found it difficult to postpone urination? Less than 1 in 5 times     How often have you had a weak urinary stream? Not at All     How often have you had to strain to start urination? Not at All     How many times did you typically get up at night to urinate? 1 Time     Total IPSS Score 4       Quality of Life due to urinary symptoms   If you were to spend the rest of your life with your urinary condition just the way it is now how would you feel about that? Delighted        Score:  1-7 Mild 8-19  Moderate 20-35 Severe    PMH: Past Medical History:  Diagnosis Date  . Anemia   . Apnea, sleep   . Bipolar affective ([place])   . Cancer ([place]) malignant melanoma  . Crohn's disease ([place])   . Depression   . Diabetes insipidus ([place])   . Erectile dysfunction   . Hyperlipidemia   . Hypertension   . IBS (irritable bowel syndrome)   . Impotence   . Internal hemorrhoids   . 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[place])   . Paraphimosis   . Peyronie disease   . Secondary hyperparathyroidism of renal origin ([place])   . Skin cancer   . Testicular hypofunction   . Urinary frequency   . Urinary hesitancy     Surgical History: Past Surgical History:  Procedure Laterality Date  . arm fracture    . BONE MARROW BIOPSY    . COLONOSCOPY WITH PROPOFOL N/A 12/23/2014   Procedure: COLONOSCOPY WITH PROPOFOL;  Surgeon: Manya Silvas, MD;  Location: [facility] ENDOSCOPY;  Service: Endoscopy;  Laterality: N/A;  .  MELANOMA EXCISION    . PENILE PROSTHESIS IMPLANT    . PENILE PROSTHESIS IMPLANT N/A 08/25/2015   Procedure: REPLACEMENT OF INFLATABLE PENILE PROSTHESIS COMPONENTS;  Surgeon: Cleon Gustin, MD;  Location: [facility] ORS;  Service: Urology;  Laterality: N/A;  . TONSILLECTOMY    . VASECTOMY      Home Medications:  Allergies as of 06/03/2016      Reactions   Mirtazapine Rash   Olanzapine Rash   Atomoxetine Other (See Comments)   Urinary sx   Strattera [atomoxetine Hcl] Other (See Comments)   Urinary sx      Medication List       Accurate as of 06/03/16 10:41 AM. Always use your most recent med list.          allopurinol 100 MG tablet Commonly known as:  ZYLOPRIM Take 100 mg by mouth daily.   aMILoride 5 MG tablet Commonly known as:  MIDAMOR Take 1 tablet (5 mg total) by mouth daily.   amLODipine 10 MG tablet Commonly known as:  NORVASC Take 1 tablet (10 mg total) by mouth daily.   benazepril 40 MG tablet Commonly known as:  LOTENSIN Take 40 mg by mouth daily.   carvedilol 6.25 MG  tablet Commonly known as:  COREG Take 6.25 mg by mouth 2 (two) times daily with a meal.   Colchicine 0.6 MG Caps Take 0.3 mg by mouth every morning. Take 2 today   fluticasone 50 MCG/ACT nasal spray Commonly known as:  FLONASE Place into both nostrils daily.   FOLIC ACID PO Take by mouth.   hydrALAZINE 100 MG tablet Commonly known as:  APRESOLINE Take 100 mg by mouth 3 (three) times daily.   ipratropium 0.03 % nasal spray Commonly known as:  ATROVENT Place 2 sprays into both nostrils every 12 (twelve) hours.   lithium carbonate 450 MG CR tablet Commonly known as:  ESKALITH Take 1 tablet (450 mg total) by mouth daily.   multivitamin tablet Take 1 tablet by mouth daily.   QUEtiapine 100 MG tablet Commonly known as:  SEROQUEL Take 0.5 tablets (50 mg total) by mouth at bedtime.   VITAMIN D (CHOLECALCIFEROL) PO Take 1 tablet by mouth daily.       Allergies:  Allergies  Allergen Reactions  . Mirtazapine Rash  . Olanzapine Rash  . Atomoxetine Other (See Comments)    Urinary sx  . Strattera [Atomoxetine Hcl] Other (See Comments)    Urinary sx    Family History: Family History  Problem Relation Age of Onset  . Depression Sister   . Bipolar disorder Other   . Prostate cancer Neg Hx   . Kidney cancer Neg Hx   . Bladder Cancer Neg Hx     Social History:  reports that he quit smoking about 43 years ago. His smoking use included Cigarettes. He has never used smokeless tobacco. He reports that he does not drink alcohol or use drugs.  ROS: UROLOGY Frequent Urination?: Yes Hard to postpone urination?: No Burning/pain with urination?: No Get up at night to urinate?: Yes Leakage of urine?: No Urine stream starts and stops?: No Trouble starting stream?: No Do you have to strain to urinate?: No Blood in urine?: No Urinary tract infection?: No Sexually transmitted disease?: No Injury to kidneys or bladder?: No Painful intercourse?: No Weak stream?: No Erection  problems?: No Penile pain?: No  Gastrointestinal Nausea?: No Vomiting?: No Indigestion/heartburn?: No Diarrhea?: No Constipation?: No  Constitutional Fever: No Night sweats?: No Weight loss?:  No Fatigue?: No  Skin Skin rash/lesions?: Yes Itching?: No  Eyes Blurred vision?: No Double vision?: No  Ears/Nose/Throat Sore throat?: No Sinus problems?: No  Hematologic/Lymphatic Swollen glands?: No Easy bruising?: Yes  Cardiovascular Leg swelling?: No Chest pain?: No  Respiratory Cough?: No Shortness of breath?: No  Endocrine Excessive thirst?: No  Musculoskeletal Back pain?: No Joint pain?: Yes  Neurological Headaches?: No Dizziness?: No  Psychologic Depression?: No Anxiety?: No  Physical Exam: BP 121/66   Pulse (!) 56   Ht '5\' 8"'  (1.727 m)   Wt 206 lb 6.4 oz (93.6 kg)   BMI 31.38 kg/m   Constitutional: Well nourished. Alert and oriented, No acute distress. HEENT: [place] AT, moist mucus membranes. Trachea midline, no masses. Cardiovascular: No clubbing, cyanosis, or edema. Respiratory: Normal respiratory effort, no increased work of breathing. GI: Abdomen is soft, non tender, non distended, no abdominal masses. Liver and spleen not palpable.  No hernias appreciated.  Stool sample for occult testing is not indicated.   GU: No CVA tenderness.  No bladder fullness or masses.  Patient with circumcised phallus.  Urethral meatus is patent.  No penile discharge. No penile lesions or rashes.  Glands floppy.  Cylinders in place.  Scrotum without lesions, cysts, rashes and/or edema.  Testicles are located scrotally bilaterally. Pump palpable high within the left hemiscrotum.  No masses are appreciated in the testicles. Left and right epididymis are normal. Rectal: Patient with  normal sphincter tone. Anus and perineum without scarring or rashes. No rectal masses are appreciated. Prostate is approximately 50 grams, no nodules are appreciated. Seminal vesicles are  normal. Skin: No rashes, bruises or suspicious lesions. Lymph: No cervical or inguinal adenopathy. Neurologic: Grossly intact, no focal deficits, moving all 4 extremities. Psychiatric: Normal mood and affect.  Laboratory Data: Lab Results  Component Value Date   WBC 3.6 11/11/2015   HGB 11.8 (L) 08/26/2015   HCT 36.5 (L) 11/11/2015   MCV 100 (H) 11/11/2015   PLT 141 (L) 11/11/2015    Lab Results  Component Value Date   CREATININE 2.11 (H) 02/26/2016    Lab Results  Component Value Date   TSH 0.751 11/11/2015       Component Value Date/Time   CHOL 184 11/11/2015 0916   HDL 34 (L) 11/11/2015 0916   CHOLHDL 4.8 09/09/2014 1052   LDLCALC 98 11/11/2015 0916    Lab Results  Component Value Date   AST 15 11/11/2015   Lab Results  Component Value Date   ALT 14 11/11/2015   PSA History  5.6 ng/mL on 11/27/2013  4.9 ng/mL on 05/28/2014  5.7 ng/mL on 09/09/2014  5.5 ng/mL on 06/03/2015  6.4 ng/mL on 11/11/2015  6.4 ng/mL on 05/31/2016    Assessment & Plan:    1. Elevated PSA  - PSA continue a slight upward trend at < 0.75 ng/mL per year  - RTC in 6 months for exam and PSA  2. Erectile dysfunction  - Satisfied with IPP  - RTC in one year for exam  3. Ejaculatory disorder  - explained to the patient that this is a multi-factorial issue   - will give a trial of Viagra 100 mg  4. BPH with LUTS  - IPSS score is 4/0, it is improving  - Continue conservative management, avoiding bladder irritants and timed voiding's  - RTC in 6 months for IPSS, PSA and exam    Return in about 6 months (around 12/04/2016) for IPSS, SHIM and exam.  These  notes generated with voice recognition software. I apologize for typographical errors.  Zara Council, [facility] [address] [place], [place] [address] [phone]

## 2016-06-03 ENCOUNTER — Encounter: Payer: Self-pay | Admitting: Urology

## 2016-06-03 ENCOUNTER — Ambulatory Visit (INDEPENDENT_AMBULATORY_CARE_PROVIDER_SITE_OTHER): Payer: Medicare Other | Admitting: Urology

## 2016-06-03 VITALS — BP 121/66 | HR 56 | Ht 68.0 in | Wt 206.4 lb

## 2016-06-03 DIAGNOSIS — N5319 Other ejaculatory dysfunction: Secondary | ICD-10-CM | POA: Diagnosis not present

## 2016-06-03 DIAGNOSIS — R972 Elevated prostate specific antigen [PSA]: Secondary | ICD-10-CM

## 2016-06-03 DIAGNOSIS — N401 Enlarged prostate with lower urinary tract symptoms: Secondary | ICD-10-CM

## 2016-06-03 DIAGNOSIS — N529 Male erectile dysfunction, unspecified: Secondary | ICD-10-CM

## 2016-06-03 DIAGNOSIS — N138 Other obstructive and reflux uropathy: Secondary | ICD-10-CM

## 2016-06-07 ENCOUNTER — Encounter: Payer: Self-pay | Admitting: Psychiatry

## 2016-06-07 ENCOUNTER — Ambulatory Visit (INDEPENDENT_AMBULATORY_CARE_PROVIDER_SITE_OTHER): Payer: Medicare Other | Admitting: Psychiatry

## 2016-06-07 ENCOUNTER — Other Ambulatory Visit: Payer: Self-pay | Admitting: Psychiatry

## 2016-06-07 VITALS — BP 144/73 | HR 65 | Temp 97.6°F | Wt 208.6 lb

## 2016-06-07 DIAGNOSIS — F3160 Bipolar disorder, current episode mixed, unspecified: Secondary | ICD-10-CM | POA: Diagnosis not present

## 2016-06-07 MED ORDER — QUETIAPINE FUMARATE 25 MG PO TABS: 25.0000 mg | ORAL_TABLET | Freq: Every day | ORAL | 1 refills | Status: DC

## 2016-06-07 MED ORDER — MELATONIN 3 MG PO TABS: 3.0000 mg | ORAL_TABLET | Freq: Every evening | ORAL | 1 refills | Status: DC | PRN

## 2016-06-07 MED ORDER — LITHIUM CARBONATE ER 450 MG PO TBCR: 450.0000 mg | EXTENDED_RELEASE_TABLET | Freq: Every day | ORAL | 1 refills | Status: DC

## 2016-06-07 NOTE — Progress Notes (Signed)
Psychiatric MD Progress Note   Patient Identification: Luis Miller. MRN:  [id number] Date of Evaluation:  06/07/2016 Referral Source: Golden Pop, M.D Chief Complaint:   Chief Complaint    Follow-up; Medication Refill     Visit Diagnosis:    ICD-9-CM ICD-10-CM   1. Bipolar I disorder, most recent episode mixed ([place]) 296.60 F31.60     History of Present Illness:    Patient is a 71 year old divorced male with history of bipolar disorder presented for follow up. He stated that he Tried to decrease the dose of Seroquel to 50 mg but he continues to feel sedated. He took the 50 ng for almost a month and then is taking 100 mg again. He appeared tired and sedated during the interview. Patient reported that he is also having problems in his knee and is going for the injection next week. We discussed about decreasing the dose of the Seroquel to 25 mg due to his sedation. He agreed with the plan. Patient reported that he is having difficulty walking due to his knee issue. He currently is compliant with his medications. He reported that his urologist is also asking him to have the lithium level done as he has been on the same dose of the medication for a long period of time. He currently denied having any suicidal homicidal ideations or plans. He appeared compliant with his medications.    He appeared well groomed during the interview. He stated that he feels physically active planning to go to the gym on a regular basis.   Associated Signs/Symptoms: Depression Symptoms:  psychomotor retardation, fatigue, loss of energy/fatigue, (Hypo) Manic Symptoms:  denied Anxiety Symptoms:  Excessive Worry, Psychotic Symptoms:  none PTSD Symptoms: Negative NA  Past Psychiatric History:  Long history of bipolar disorder. He reported that he has seen Dr. Vallarie Mare for many years in the past. ECT Treatment - several  Psychiatric Admission x 4, [facility], [facility], [place], [place] No h/o Suicide  attemtps.   Previous Psychotropic Medications:  Lithium Mellaril Seroquel Does not remember the names.   Allergies- Remeron Olanzapine Strattera   Substance Abuse History in the last 12 months:  No.  Consequences of Substance Abuse: Negative NA  Past Medical History:  Past Medical History:  Diagnosis Date  . Anemia   . Apnea, sleep   . Bipolar affective ([place])   . Cancer ([place]) malignant melanoma  . Crohn's disease ([place])   . Depression   . Diabetes insipidus ([place])   . Erectile dysfunction   . Hyperlipidemia   . Hypertension   . IBS (irritable bowel syndrome)   . Impotence   . Internal hemorrhoids   . 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[place])   . Paraphimosis   . Peyronie disease   . Secondary hyperparathyroidism of renal origin ([place])   . Skin cancer   . Testicular hypofunction   . Urinary frequency   . Urinary hesitancy     Past Surgical History:  Procedure Laterality Date  . arm fracture    . BONE MARROW BIOPSY    . COLONOSCOPY WITH PROPOFOL N/A 12/23/2014   Procedure: COLONOSCOPY WITH PROPOFOL;  Surgeon: Manya Silvas, MD;  Location: [facility] ENDOSCOPY;  Service: Endoscopy;  Laterality: N/A;  . MELANOMA EXCISION    . PENILE PROSTHESIS IMPLANT    . PENILE PROSTHESIS IMPLANT N/A 08/25/2015   Procedure: REPLACEMENT OF INFLATABLE PENILE PROSTHESIS COMPONENTS;  Surgeon: Cleon Gustin, MD;  Location: [facility] ORS;  Service: Urology;  Laterality: N/A;  .  TONSILLECTOMY    . VASECTOMY      Family Psychiatric History:  He reported bipolar disorder in his sister and nephew.  Family History:  Family History  Problem Relation Age of Onset  . Depression Sister   . Bipolar disorder Other   . Prostate cancer Neg Hx   . Kidney cancer Neg Hx   . Bladder Cancer Neg Hx     Social History:   Social History   Social History  . Marital status: Divorced    Spouse name: N/A  . Number of children: N/A  . Years of education: N/A   Social History Main Topics  . Smoking  status: Former Smoker    Types: Cigarettes    Quit date: 09/08/1972  . Smokeless tobacco: Never Used  . Alcohol use No  . Drug use: No  . Sexual activity: No   Other Topics Concern  . None   Social History Narrative  . None    Additional Social History:  Divorced- x 2 Lives with GF, she is 32 Retired- from [employer].    Allergies:   Allergies  Allergen Reactions  . Mirtazapine Rash  . Olanzapine Rash  . Atomoxetine Other (See Comments)    Urinary sx  . Strattera [Atomoxetine Hcl] Other (See Comments)    Urinary sx    Metabolic Disorder Labs: No results found for: HGBA1C, MPG No results found for: PROLACTIN Lab Results  Component Value Date   CHOL 184 11/11/2015   TRIG 261 (H) 11/11/2015   HDL 34 (L) 11/11/2015   CHOLHDL 4.8 09/09/2014   LDLCALC 98 11/11/2015   LDLCALC 104 (H) 09/09/2014     Current Medications: Current Outpatient Prescriptions  Medication Sig Dispense Refill  . allopurinol (ZYLOPRIM) 100 MG tablet Take 100 mg by mouth daily.     Marland Kitchen aMILoride (MIDAMOR) 5 MG tablet Take 1 tablet (5 mg total) by mouth daily. 90 tablet 4  . amLODipine (NORVASC) 10 MG tablet Take 1 tablet (10 mg total) by mouth daily. 90 tablet 4  . benazepril (LOTENSIN) 40 MG tablet Take 40 mg by mouth daily.     . carvedilol (COREG) 6.25 MG tablet Take 6.25 mg by mouth 2 (two) times daily with a meal.     . Colchicine 0.6 MG CAPS Take 0.3 mg by mouth every morning. Take 2 today 30 capsule 3  . fluticasone (FLONASE) 50 MCG/ACT nasal spray Place into both nostrils daily.    Marland Kitchen FOLIC ACID PO Take by mouth.    . hydrALAZINE (APRESOLINE) 100 MG tablet Take 100 mg by mouth 3 (three) times daily.    Marland Kitchen ipratropium (ATROVENT) 0.03 % nasal spray Place 2 sprays into both nostrils every 12 (twelve) hours.    Marland Kitchen lithium carbonate (ESKALITH) 450 MG CR tablet Take 1 tablet (450 mg total) by mouth daily. 90 tablet 1  . Multiple Vitamin (MULTIVITAMIN) tablet Take 1 tablet by mouth daily.    Marland Kitchen VITAMIN D,  CHOLECALCIFEROL, PO Take 1 tablet by mouth daily.     . Melatonin 3 MG TABS Take 1 tablet (3 mg total) by mouth at bedtime as needed. 30 tablet 1  . QUEtiapine (SEROQUEL) 25 MG tablet Take 1 tablet (25 mg total) by mouth at bedtime. 90 tablet 1   No current facility-administered medications for this visit.     Neurologic: Headache: No Seizure: No Paresthesias:No  Musculoskeletal: Strength & Muscle Tone: within normal limits Gait & Station: normal Patient leans: N/A  Psychiatric Specialty  Exam: ROS  Blood pressure (!) 144/73, pulse 65, temperature 97.6 F (36.4 C), temperature source Oral, weight 208 lb 9.6 oz (94.6 kg).Body mass index is 31.72 kg/m.  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 He agreed with the plan. Patient will also take melatonin when necessary if he continues to have insomnia.  Patient will follow up in 2 months or earlier depending on his symptoms.   More than 50% of the time spent in psychoeducation, counseling and coordination of care.    This note was generated in part or whole with voice  recognition software. Voice regonition is usually quite accurate but there are transcription errors that can and very often do occur. I apologize for any typographical errors that were not detected and corrected.    Rainey Pines, MD 3/19/201810:49 AM

## 2016-06-08 LAB — COMPREHENSIVE METABOLIC PANEL
ALT: 19 IU/L (ref 0–44)
AST: 19 IU/L (ref 0–40)
Albumin/Globulin Ratio: 1.7 (ref 1.2–2.2)
Albumin: 4.2 g/dL (ref 3.5–4.8)
Alkaline Phosphatase: 70 IU/L (ref 39–117)
BUN/Creatinine Ratio: 14 (ref 10–24)
BUN: 32 mg/dL — ABNORMAL HIGH (ref 8–27)
Bilirubin Total: 0.5 mg/dL (ref 0.0–1.2)
CO2: 22 mmol/L (ref 18–29)
Calcium: 9.7 mg/dL (ref 8.6–10.2)
Chloride: 110 mmol/L — ABNORMAL HIGH (ref 96–106)
Creatinine, Ser: 2.28 mg/dL — ABNORMAL HIGH (ref 0.76–1.27)
GFR calc Af Amer: 32 mL/min/{1.73_m2} — ABNORMAL LOW (ref >59)
GFR calc non Af Amer: 28 mL/min/{1.73_m2} — ABNORMAL LOW (ref >59)
Globulin, Total: 2.5 g/dL (ref 1.5–4.5)
Glucose: 93 mg/dL (ref 65–99)
Potassium: 4 mmol/L (ref 3.5–5.2)
Sodium: 150 mmol/L — ABNORMAL HIGH (ref 134–144)
Total Protein: 6.7 g/dL (ref 6.0–8.5)

## 2016-06-08 LAB — CBC
Hematocrit: 38.4 % (ref 37.5–51.0)
Hemoglobin: 12.9 g/dL — ABNORMAL LOW (ref 13.0–17.7)
MCH: 34 pg — ABNORMAL HIGH (ref 26.6–33.0)
MCHC: 33.6 g/dL (ref 31.5–35.7)
MCV: 101 fL — ABNORMAL HIGH (ref 79–97)
Platelets: 160 10*3/uL (ref 150–379)
RBC: 3.79 x10E6/uL — ABNORMAL LOW (ref 4.14–5.80)
RDW: 14.4 % (ref 12.3–15.4)
WBC: 5 10*3/uL (ref 3.4–10.8)

## 2016-06-08 LAB — LIPID PANEL W/O CHOL/HDL RATIO
Cholesterol, Total: 193 mg/dL (ref 100–199)
HDL: 35 mg/dL — ABNORMAL LOW (ref >39)
LDL Calculated: 95 mg/dL (ref 0–99)
Triglycerides: 315 mg/dL — ABNORMAL HIGH (ref 0–149)
VLDL Cholesterol Cal: 63 mg/dL — ABNORMAL HIGH (ref 5–40)

## 2016-06-08 LAB — LITHIUM LEVEL: Lithium Lvl: 0.7 mmol/L (ref 0.6–1.2)

## 2016-06-08 LAB — TSH: TSH: 0.419 u[IU]/mL — ABNORMAL LOW (ref 0.450–4.500)

## 2016-06-09 ENCOUNTER — Telehealth: Payer: Self-pay | Admitting: Psychiatry

## 2016-06-09 NOTE — Progress Notes (Signed)
Spoke with patient. He reported that he is following with a urologist Dr Holley Raring as well as with the endocrinologist. He reported that he has just seen his urologist Dr. Zollie Scale 2 days ago. They are keeping up with his kidney function and endocrinologist following him on a yearly basis. He is not having any issues with his labs. I discussed the labs with him in detail.

## 2016-06-09 NOTE — Telephone Encounter (Signed)
Reviewed lab results. His LI level is WNL. TSH Low.  Will contact pt to follow up with PCP

## 2016-06-10 ENCOUNTER — Other Ambulatory Visit: Payer: Self-pay

## 2016-06-10 NOTE — Telephone Encounter (Signed)
Last OV: 11/11/15, last acute 02/26/16 Next OV: None on file  BMP Latest Ref Rng & Units 06/07/2016 02/26/2016 11/11/2015  Glucose 65 - 99 mg/dL 93 94 102(H)  BUN 8 - 27 mg/dL 32(H) 27 26  Creatinine 0.76 - 1.27 mg/dL 2.28(H) 2.11(H) 2.21(H)  BUN/Creat Ratio 10 - 24 14 13 12   Sodium 134 - 144 mmol/L 150(H) 146(H) 149(H)  Potassium 3.5 - 5.2 mmol/L 4.0 4.7 4.3  Chloride 96 - 106 mmol/L 110(H) 110(H) 112(H)  CO2 18 - 29 mmol/L 22 23 22   Calcium 8.6 - 10.2 mg/dL 9.7 9.6 9.4

## 2016-06-14 ENCOUNTER — Ambulatory Visit: Payer: Medicare Other | Admitting: Psychiatry

## 2016-06-16 ENCOUNTER — Other Ambulatory Visit: Payer: Self-pay

## 2016-06-16 NOTE — Telephone Encounter (Signed)
Pt mail order pharmacy switched.   Last OV: 02/26/16 Next OV:  None on file.  BMP Latest Ref Rng & Units 06/07/2016 02/26/2016 11/11/2015  Glucose 65 - 99 mg/dL 93 94 102(H)  BUN 8 - 27 mg/dL 32(H) 27 26  Creatinine 0.76 - 1.27 mg/dL 2.28(H) 2.11(H) 2.21(H)  BUN/Creat Ratio 10 - 24 14 13 12   Sodium 134 - 144 mmol/L 150(H) 146(H) 149(H)  Potassium 3.5 - 5.2 mmol/L 4.0 4.7 4.3  Chloride 96 - 106 mmol/L 110(H) 110(H) 112(H)  CO2 18 - 29 mmol/L 22 23 22   Calcium 8.6 - 10.2 mg/dL 9.7 9.6 9.4

## 2016-06-17 MED ORDER — AMLODIPINE BESYLATE 10 MG PO TABS: 10.0000 mg | ORAL_TABLET | Freq: Every day | ORAL | 0 refills | Status: DC

## 2016-06-22 ENCOUNTER — Other Ambulatory Visit: Payer: Self-pay

## 2016-06-22 DIAGNOSIS — M1712 Unilateral primary osteoarthritis, left knee: Secondary | ICD-10-CM | POA: Diagnosis not present

## 2016-06-23 ENCOUNTER — Other Ambulatory Visit: Payer: Self-pay | Admitting: Family Medicine

## 2016-06-23 MED ORDER — AMILORIDE HCL 5 MG PO TABS: 5.0000 mg | ORAL_TABLET | Freq: Every day | ORAL | 4 refills | Status: DC

## 2016-06-23 NOTE — Telephone Encounter (Signed)
Pt would like refill for aMILoride (MIDAMOR) 5 MG tablet [facility]

## 2016-06-23 NOTE — Telephone Encounter (Signed)
Pt requesting refill as he is switching mail order pharmacy. Please advise.

## 2016-06-29 DIAGNOSIS — M1712 Unilateral primary osteoarthritis, left knee: Secondary | ICD-10-CM | POA: Diagnosis not present

## 2016-07-06 DIAGNOSIS — M1712 Unilateral primary osteoarthritis, left knee: Secondary | ICD-10-CM | POA: Diagnosis not present

## 2016-07-20 ENCOUNTER — Telehealth: Payer: Self-pay | Admitting: Psychiatry

## 2016-07-20 DIAGNOSIS — M5030 Other cervical disc degeneration, unspecified cervical region: Secondary | ICD-10-CM | POA: Diagnosis not present

## 2016-07-20 DIAGNOSIS — M549 Dorsalgia, unspecified: Secondary | ICD-10-CM | POA: Diagnosis not present

## 2016-07-27 ENCOUNTER — Encounter: Payer: Self-pay | Admitting: Psychiatry

## 2016-07-27 ENCOUNTER — Ambulatory Visit (INDEPENDENT_AMBULATORY_CARE_PROVIDER_SITE_OTHER): Payer: Medicare Other | Admitting: Psychiatry

## 2016-07-27 VITALS — BP 144/74 | HR 55 | Temp 97.5°F | Wt 209.2 lb

## 2016-07-27 DIAGNOSIS — F3160 Bipolar disorder, current episode mixed, unspecified: Secondary | ICD-10-CM | POA: Diagnosis not present

## 2016-07-27 NOTE — Progress Notes (Signed)
Psychiatric MD Progress Note   Patient Identification: Luis Miller. MRN:  [id number] Date of Evaluation:  07/27/2016 Referral Source: Golden Pop, M.D Chief Complaint:    Visit Diagnosis:    ICD-9-CM ICD-10-CM   1. Bipolar I disorder, most recent episode mixed ([place]) 296.60 F31.60     History of Present Illness:    Patient is a 71 year old divorced male with history of bipolar disorder presented for follow up. He stated that he  Has been doing well and has been compliant with his medications. He reported that he is planning to go to [facility] with his girlfriend next week. He is excited about the same. He has also changed his glasses and has been having some difficulty looking through them. We discussed about his medications. He is currently taking Seroquel 25 mg at bedtime and has been compliant with his lithium.   Patient reported that he is also following with his urologist and his kidney doctor on a regular basis. I discussed the lab results with him. He does not have any side effects of the medications at this time. He denied having any suicidal homicidal ideations or plans.   He appeared well groomed during the interview. He stated that he feels physically active planning to go to the gym on a regular basis.   Associated Signs/Symptoms: Depression Symptoms:  psychomotor retardation, fatigue, loss of energy/fatigue, (Hypo) Manic Symptoms:  denied Anxiety Symptoms:  Excessive Worry, Psychotic Symptoms:  none PTSD Symptoms: Negative NA  Past Psychiatric History:  Long history of bipolar disorder. He reported that he has seen Dr. Vallarie Mare for many years in the past. ECT Treatment - several  Psychiatric Admission x 4, [facility], [facility], [place], [place] No h/o Suicide attemtps.   Previous Psychotropic Medications:  Lithium Mellaril Seroquel Does not remember the names.   Allergies- Remeron Olanzapine Strattera   Substance Abuse History in the last 12 months:   No.  Consequences of Substance Abuse: Negative NA  Past Medical History:  Past Medical History:  Diagnosis Date  . Anemia   . Apnea, sleep   . Bipolar affective ([place])   . Cancer ([place]) malignant melanoma  . Crohn's disease ([place])   . Depression   . Diabetes insipidus ([place])   . Erectile dysfunction   . Hyperlipidemia   . Hypertension   . IBS (irritable bowel syndrome)   . Impotence   . Internal hemorrhoids   . 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[place])   . Paraphimosis   . Peyronie disease   . Secondary hyperparathyroidism of renal origin ([place])   . Skin cancer   . Testicular hypofunction   . Urinary frequency   . Urinary hesitancy     Past Surgical History:  Procedure Laterality Date  . arm fracture    . BONE MARROW BIOPSY    . COLONOSCOPY WITH PROPOFOL N/A 12/23/2014   Procedure: COLONOSCOPY WITH PROPOFOL;  Surgeon: Manya Silvas, MD;  Location: [facility] ENDOSCOPY;  Service: Endoscopy;  Laterality: N/A;  . MELANOMA EXCISION    . PENILE PROSTHESIS IMPLANT    . PENILE PROSTHESIS IMPLANT N/A 08/25/2015   Procedure: REPLACEMENT OF INFLATABLE PENILE PROSTHESIS COMPONENTS;  Surgeon: Cleon Gustin, MD;  Location: [facility] ORS;  Service: Urology;  Laterality: N/A;  . TONSILLECTOMY    . VASECTOMY      Family Psychiatric History:  He reported bipolar disorder in his sister and nephew.  Family History:  Family History  Problem Relation Age of Onset  . Depression Sister   .  Bipolar disorder Other   . Prostate cancer Neg Hx   . Kidney cancer Neg Hx   . Bladder Cancer Neg Hx     Social History:   Social History   Social History  . Marital status: Divorced    Spouse name: N/A  . Number of children: N/A  . Years of education: N/A   Social History Main Topics  . Smoking status: Former Smoker    Types: Cigarettes    Quit date: 09/08/1972  . Smokeless tobacco: Never Used  . Alcohol use No  . Drug use: No  . Sexual activity: No   Other Topics Concern  . Not on file    Social History Narrative  . No narrative on file    Additional Social History:  Divorced- x 2 Lives with GF, she is 10 Retired- from [employer].    Allergies:   Allergies  Allergen Reactions  . Mirtazapine Rash  . Olanzapine Rash  . Atomoxetine Other (See Comments)    Urinary sx  . Strattera [Atomoxetine Hcl] Other (See Comments)    Urinary sx    Metabolic Disorder Labs: No results found for: HGBA1C, MPG No results found for: PROLACTIN Lab Results  Component Value Date   CHOL 193 06/07/2016   TRIG 315 (H) 06/07/2016   HDL 35 (L) 06/07/2016   CHOLHDL 4.8 09/09/2014   [place] 95 06/07/2016   LDLCALC 98 11/11/2015     Current Medications: Current Outpatient Prescriptions  Medication Sig Dispense Refill  . allopurinol (ZYLOPRIM) 100 MG tablet Take 100 mg by mouth daily.     Marland Kitchen aMILoride (MIDAMOR) 5 MG tablet Take 1 tablet (5 mg total) by mouth daily. 90 tablet 4  . amLODipine (NORVASC) 10 MG tablet Take 1 tablet (10 mg total) by mouth daily. 90 tablet 0  . benazepril (LOTENSIN) 40 MG tablet Take 40 mg by mouth daily.     . carvedilol (COREG) 6.25 MG tablet Take 6.25 mg by mouth 2 (two) times daily with a meal.     . Colchicine 0.6 MG CAPS Take 0.3 mg by mouth every morning. Take 2 today 30 capsule 3  . fluticasone (FLONASE) 50 MCG/ACT nasal spray Place into both nostrils daily.    Marland Kitchen FOLIC ACID PO Take by mouth.    . hydrALAZINE (APRESOLINE) 100 MG tablet Take 100 mg by mouth 3 (three) times daily.    Marland Kitchen ipratropium (ATROVENT) 0.03 % nasal spray Place 2 sprays into both nostrils every 12 (twelve) hours.    Marland Kitchen lithium carbonate (ESKALITH) 450 MG CR tablet Take 1 tablet (450 mg total) by mouth daily. 90 tablet 1  . Melatonin 3 MG TABS Take 1 tablet (3 mg total) by mouth at bedtime as needed. 30 tablet 1  . Multiple Vitamin (MULTIVITAMIN) tablet Take 1 tablet by mouth daily.    . QUEtiapine (SEROQUEL) 25 MG tablet Take 1 tablet (25 mg total) by mouth at bedtime. 90 tablet 1  .  VITAMIN D, CHOLECALCIFEROL, PO Take 1 tablet by mouth daily.      No current facility-administered medications for this visit.     Neurologic: Headache: No Seizure: No Paresthesias:No  Musculoskeletal: Strength & Muscle Tone: within normal limits Gait & Station: normal Patient leans: N/A  Psychiatric Specialty Exam: ROS  There were no vitals taken for this visit.There is no height or weight on file to calculate BMI.  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   Patient will follow up in 3 months or earlier depending on his symptoms   More than 50% of the time spent in psychoeducation, counseling and coordination of care.    This note was generated in part or whole with voice recognition software. Voice regonition is usually quite accurate but there are transcription errors that can and very often do occur. I apologize for any typographical errors that were not detected and corrected.    Rainey Pines, MD 5/8/201810:55 AM

## 2016-07-29 DIAGNOSIS — M1712 Unilateral primary osteoarthritis, left knee: Secondary | ICD-10-CM | POA: Diagnosis not present

## 2016-08-02 ENCOUNTER — Ambulatory Visit: Payer: Medicare Other | Admitting: Psychiatry

## 2016-08-19 DIAGNOSIS — E232 Diabetes insipidus: Secondary | ICD-10-CM | POA: Diagnosis not present

## 2016-08-19 DIAGNOSIS — N183 Chronic kidney disease, stage 3 (moderate): Secondary | ICD-10-CM | POA: Diagnosis not present

## 2016-08-19 DIAGNOSIS — I10 Essential (primary) hypertension: Secondary | ICD-10-CM | POA: Diagnosis not present

## 2016-08-19 DIAGNOSIS — N2581 Secondary hyperparathyroidism of renal origin: Secondary | ICD-10-CM | POA: Diagnosis not present

## 2016-08-27 NOTE — Telephone Encounter (Signed)
Need to find out about labwork

## 2016-08-28 ENCOUNTER — Other Ambulatory Visit: Payer: Self-pay | Admitting: Family Medicine

## 2016-08-30 DIAGNOSIS — N5202 Corporo-venous occlusive erectile dysfunction: Secondary | ICD-10-CM | POA: Diagnosis not present

## 2016-10-15 ENCOUNTER — Other Ambulatory Visit: Payer: Self-pay

## 2016-10-25 ENCOUNTER — Ambulatory Visit (INDEPENDENT_AMBULATORY_CARE_PROVIDER_SITE_OTHER): Payer: Medicare Other | Admitting: Psychiatry

## 2016-10-25 ENCOUNTER — Encounter: Payer: Self-pay | Admitting: Psychiatry

## 2016-10-25 VITALS — BP 131/73 | HR 48 | Temp 97.9°F | Wt 205.2 lb

## 2016-10-25 DIAGNOSIS — F3160 Bipolar disorder, current episode mixed, unspecified: Secondary | ICD-10-CM | POA: Diagnosis not present

## 2016-10-25 MED ORDER — LITHIUM CARBONATE ER 450 MG PO TBCR: 450.0000 mg | EXTENDED_RELEASE_TABLET | Freq: Every day | ORAL | 1 refills | Status: DC

## 2016-10-25 NOTE — Progress Notes (Signed)
Psychiatric MD Progress Note   Patient Identification: Luis Miller. MRN:  [id number] Date of Evaluation:  10/25/2016 Referral Source: Golden Pop, M.D Chief Complaint:   Chief Complaint    Follow-up; Medication Refill     Visit Diagnosis:    ICD-10-CM   1. Bipolar I disorder, most recent episode mixed ([place]) F31.60     History of Present Illness:    Patient is a 71 year old divorced male with history of bipolar disorder presented for follow up. He stated that he He has been feeling tired and he has noted that his pulse is very low. He has checked his pulse at home and it was also in the 40s. He reported that he has felt dizzy couple of times. He is currently on several medications for his hypertension. He reported that he is willing to talk to his primary care physician about his medications. He reported that he is willing to stop the Seroquel at this time. We discussed about his lithium and he reported that he is continuing to take the lithium as prescribed. He has been following with his nephrologist on a regular basis. He reported that he has been sleeping well at this time. He currently denied having any suicidal ideations or plans. He spends the weekend with his girlfriend and they go for a dance and then dinner on a regular basis.  He appeared well groomed during the interview. Patient currently denied having any suicidal homicidal ideations or plans.   Associated Signs/Symptoms: Depression Symptoms:  fatigue, loss of energy/fatigue, (Hypo) Manic Symptoms:  denied Anxiety Symptoms:  Excessive Worry, Psychotic Symptoms:  none PTSD Symptoms: Negative NA  Past Psychiatric History:  Long history of bipolar disorder. He reported that he has seen Dr. Vallarie Mare for many years in the past. ECT Treatment - several  Psychiatric Admission x 4, [facility], [facility], [facility], [place] No h/o Suicide attemtps.   Previous Psychotropic Medications:  Lithium Mellaril Seroquel Does not  remember the names.   Allergies- Remeron Olanzapine Strattera   Substance Abuse History in the last 12 months:  No.  Consequences of Substance Abuse: Negative NA  Past Medical History:  Past Medical History:  Diagnosis Date  . Anemia   . Apnea, sleep   . Bipolar affective ([place])   . Cancer ([place]) malignant melanoma  . Crohn's disease ([place])   . Depression   . Diabetes insipidus ([place])   . Erectile dysfunction   . Hyperlipidemia   . Hypertension   . IBS (irritable bowel syndrome)   . Impotence   . Internal hemorrhoids   . 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[place])   . Paraphimosis   . Peyronie disease   . Secondary hyperparathyroidism of renal origin ([place])   . Skin cancer   . Testicular hypofunction   . Urinary frequency   . Urinary hesitancy     Past Surgical History:  Procedure Laterality Date  . arm fracture    . BONE MARROW BIOPSY    . COLONOSCOPY WITH PROPOFOL N/A 12/23/2014   Procedure: COLONOSCOPY WITH PROPOFOL;  Surgeon: Manya Silvas, MD;  [facility] ENDOSCOPY;  Service: Endoscopy;  Laterality: N/A;  . MELANOMA EXCISION    . PENILE PROSTHESIS IMPLANT    . PENILE PROSTHESIS IMPLANT N/A 08/25/2015   Procedure: REPLACEMENT OF INFLATABLE PENILE PROSTHESIS COMPONENTS;  Surgeon: Cleon Gustin, MD;  Location: [facility] ORS;  Service: Urology;  Laterality: N/A;  . TONSILLECTOMY    . VASECTOMY      Family Psychiatric History:  He reported bipolar disorder in his sister and nephew.  Family History:  Family History  Problem Relation Age of Onset  . Depression Sister   . Bipolar disorder Other   . Prostate cancer Neg Hx   . Kidney cancer Neg Hx   . Bladder Cancer Neg Hx     Social History:   Social History   Social History  . Marital status: Divorced    Spouse name: N/A  . Number of children: N/A  . Years of education: N/A   Social History Main Topics  . Smoking status: Former Smoker    Types: Cigarettes    Quit date: 09/08/1972  . Smokeless tobacco:  Never Used  . Alcohol use No  . Drug use: No  . Sexual activity: No   Other Topics Concern  . None   Social History Narrative  . None    Additional Social History:  Divorced- x 2 Lives with GF, she is 10 Retired- from [employer].    Allergies:   Allergies  Allergen Reactions  . Mirtazapine Rash  . Olanzapine Rash  . Atomoxetine Other (See Comments)    Urinary sx  . Strattera [Atomoxetine Hcl] Other (See Comments)    Urinary sx    Metabolic Disorder Labs: No results found for: HGBA1C, MPG No results found for: PROLACTIN Lab Results  Component Value Date   CHOL 193 06/07/2016   TRIG 315 (H) 06/07/2016   HDL 35 (L) 06/07/2016   CHOLHDL 4.8 09/09/2014   [place] 95 06/07/2016   LDLCALC 98 11/11/2015     Current Medications: Current Outpatient Prescriptions  Medication Sig Dispense Refill  . allopurinol (ZYLOPRIM) 100 MG tablet Take 100 mg by mouth daily.     Marland Kitchen aMILoride (MIDAMOR) 5 MG tablet Take 1 tablet (5 mg total) by mouth daily. 90 tablet 4  . amLODipine (NORVASC) 10 MG tablet TAKE 1 TABLET DAILY 90 tablet 0  . benazepril (LOTENSIN) 40 MG tablet Take 40 mg by mouth daily.     . carvedilol (COREG) 6.25 MG tablet Take 6.25 mg by mouth 2 (two) times daily with a meal.     . Colchicine 0.6 MG CAPS Take 0.3 mg by mouth every morning. Take 2 today 30 capsule 3  . fluticasone (FLONASE) 50 MCG/ACT nasal spray Place into both nostrils daily.    Marland Kitchen FOLIC ACID PO Take by mouth.    . hydrALAZINE (APRESOLINE) 100 MG tablet Take 100 mg by mouth 3 (three) times daily.    Marland Kitchen ipratropium (ATROVENT) 0.03 % nasal spray Place 2 sprays into both nostrils every 12 (twelve) hours.    Marland Kitchen lithium carbonate (ESKALITH) 450 MG CR tablet Take 1 tablet (450 mg total) by mouth daily. 90 tablet 1  . Multiple Vitamin (MULTIVITAMIN) tablet Take 1 tablet by mouth daily.    Marland Kitchen VITAMIN D, CHOLECALCIFEROL, PO Take 1 tablet by mouth daily.      No current facility-administered medications for this visit.      Neurologic: Headache: No Seizure: No Paresthesias:No  Musculoskeletal: Strength & Muscle Tone: within normal limits Gait & Station: normal Patient leans: N/A  Psychiatric Specialty Exam: ROS  Blood pressure 131/73, pulse (!) 48, temperature 97.9 F (36.6 C), temperature source Oral, weight 205 lb 3.2 oz (93.1 kg).Body mass index is 31.2 kg/m.  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  Advised patient that he should follow-up with his primary care physician regarding his low pulse and he agreed with the plan.  He will follow-up in 3 months or earlier depending on his symptoms.       More than 50% of the time spent in psychoeducation, counseling and coordination of care.    This note was generated in part or whole with voice recognition software. Voice regonition is usually quite accurate but there are transcription errors that can and very often do occur. I apologize for any typographical errors that were not detected and corrected.    Rainey Pines, MD 8/6/201810:53 AM

## 2016-11-04 ENCOUNTER — Telehealth: Payer: Self-pay

## 2016-11-04 NOTE — Telephone Encounter (Signed)
pt left a copy of his [facility] bill. pt states that you told him that you was going to do a letter to see about getting bill reduced. pt wants to pick up a letter call when done.

## 2016-11-08 ENCOUNTER — Ambulatory Visit (INDEPENDENT_AMBULATORY_CARE_PROVIDER_SITE_OTHER): Payer: Medicare Other | Admitting: Family Medicine

## 2016-11-08 ENCOUNTER — Encounter: Payer: Self-pay | Admitting: Family Medicine

## 2016-11-08 VITALS — BP 153/81 | HR 48 | Ht 68.0 in | Wt 220.0 lb

## 2016-11-08 DIAGNOSIS — I10 Essential (primary) hypertension: Secondary | ICD-10-CM

## 2016-11-08 DIAGNOSIS — R001 Bradycardia, unspecified: Secondary | ICD-10-CM | POA: Diagnosis not present

## 2016-11-08 NOTE — Progress Notes (Signed)
   BP (!) 153/81   Pulse (!) 48   Ht 5\' 8"  (1.727 m)   Wt 220 lb (99.8 kg)   SpO2 99%   BMI 33.45 kg/m    Subjective:    Patient ID: Luis Miller., male    DOB: 01-10-1946, 71 y.o.   MRN: [id number]  HPI: [name] is a 71 y.o. male  Chief Complaint  Patient presents with  . Hypertension    htn AND heart rate slow   Patient follow-up hypertension has blood pressure readings from psychiatry which are consistently good. Here he is to high today. Looking at nephrology blood pressures been okay. Takes blood pressure medicine without problems or issues. Of concern is patient's noted low pulse rate. Hasn't really had any low blood pressure or low pulse symptoms no orthostatic changes lightheadedness. Patient does dance on Friday nights without problems.  Relevant past medical, surgical, family and social history reviewed and updated as indicated. Interim medical history since our last visit reviewed. Allergies and medications reviewed and updated.  Review of Systems  Constitutional: Negative.   Respiratory: Negative.   Cardiovascular: Negative.     Per HPI unless specifically indicated above     Objective:    BP (!) 153/81   Pulse (!) 48   Ht 5\' 8"  (1.727 m)   Wt 220 lb (99.8 kg)   SpO2 99%   BMI 33.45 kg/m   Wt Readings from Last 3 Encounters:  11/08/16 220 lb (99.8 kg)  06/03/16 206 lb 6.4 oz (93.6 kg)  02/26/16 205 lb (93 kg)    Physical Exam  Constitutional: He is oriented to person, place, and time. He appears well-developed and well-nourished.  HENT:  Head: Normocephalic and atraumatic.  Eyes: Conjunctivae and EOM are normal.  Neck: Normal range of motion.  Cardiovascular: Normal rate, regular rhythm and normal heart sounds.   Pulmonary/Chest: Effort normal and breath sounds normal.  Musculoskeletal: Normal range of motion.  Neurological: He is alert and oriented to person, place, and time.  Skin: No erythema.  Psychiatric: He has a normal mood  and affect. His behavior is normal. Judgment and thought content normal.    Results for orders placed or performed in visit on 05/31/16  PSA  Result Value Ref Range   Prostate Specific Ag, Serum 6.4 (H) 0.0 - 4.0 ng/mL      Assessment & Plan:   Problem List Items Addressed This Visit      Cardiovascular and Mediastinum   Hypertension - Primary (Chronic)    High here today but all in all good control will not change medications except for due to bradycardia Will try stopping and carvedilol 6.25 twice a day and observe pulse        Other   Bradycardia   Relevant Orders   EKG 12-Lead (Completed)     For bradycardia see above stopping carvedilol Has appointment in 3 days will recheck during his physical blood pressure control with decreasing medication and pulse rate. EKG showing bradycardia no other acute or concerning changes. Follow up plan: Return for Physical Exam.

## 2016-11-08 NOTE — Assessment & Plan Note (Signed)
High here today but all in all good control will not change medications except for due to bradycardia Will try stopping and carvedilol 6.25 twice a day and observe pulse

## 2016-11-08 NOTE — Telephone Encounter (Signed)
Medication management - Telephone call with patient, per Dr. Gretel Acre request, to inform she could not write a letter about his lab work bill if the bill was his responsible co-payment amount.   Patient stated he had a supplement coverage along with his Medicare and agreed to call both to see if the supplement coverage should be taking care of his co-payment expense and patient will contact Dr. Gretel Acre back if anything needed from her as stated he understood this was his co-payment portion for having lab work completed.  Patient to follow up with both insurance companies to make sure all charges they cover have been paid.

## 2016-11-09 DIAGNOSIS — N5202 Corporo-venous occlusive erectile dysfunction: Secondary | ICD-10-CM | POA: Diagnosis not present

## 2016-11-11 ENCOUNTER — Ambulatory Visit (INDEPENDENT_AMBULATORY_CARE_PROVIDER_SITE_OTHER): Payer: Medicare Other

## 2016-11-11 VITALS — BP 148/84 | HR 73 | Temp 98.4°F | Resp 16 | Ht 67.0 in | Wt 201.2 lb

## 2016-11-11 DIAGNOSIS — Z0000 Encounter for general adult medical examination without abnormal findings: Secondary | ICD-10-CM | POA: Diagnosis not present

## 2016-11-11 NOTE — Progress Notes (Signed)
Subjective:   Luis Miller. is a 71 y.o. male who presents for Medicare Annual/Subsequent preventive examination.  Review of Systems:  Cardiac Risk Factors include: male gender;advanced age (>49mn, >>55women);obesity (BMI >30kg/m2);hypertension     Objective:    Vitals: BP (!) 148/84 (BP Location: Right Arm, Patient Position: Sitting)   Pulse 73   Temp 98.4 F (36.9 C)   Resp 16   Ht '5\' 7"'  (1.702 m)   Wt 201 lb 3.2 oz (91.3 kg)   BMI 31.51 kg/m   Body mass index is 31.51 kg/m.  Tobacco History  Smoking Status  . Former Smoker  . Types: Cigarettes  . Quit date: 09/08/1972  Smokeless Tobacco  . Never Used     Counseling given: Not Answered   Past Medical History:  Diagnosis Date  . Anemia   . Apnea, sleep   . [place]   . Cancer ([place] malignant melanoma  . Crohn's disease ([place]   . Depression   . Diabetes insipidus ([place]   . Erectile dysfunction   . Hyperlipidemia   . Hypertension   . IBS (irritable bowel syndrome)   . Impotence   . Internal hemorrhoids   . 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[place]   . Paraphimosis   . Peyronie disease   . Secondary hyperparathyroidism of renal origin ([place]   . Skin cancer   . Testicular hypofunction   . Urinary frequency   . Urinary hesitancy    Past Surgical History:  Procedure Laterality Date  . arm fracture    . BONE MARROW BIOPSY    . COLONOSCOPY WITH PROPOFOL N/A 12/23/2014   Procedure: COLONOSCOPY WITH PROPOFOL;  Surgeon: RManya Silvas MD;  [facility];  Service: Endoscopy;  Laterality: N/A;  . MELANOMA EXCISION    . PENILE PROSTHESIS IMPLANT    . PENILE PROSTHESIS IMPLANT N/A 08/25/2015   Procedure: REPLACEMENT OF INFLATABLE PENILE PROSTHESIS COMPONENTS;  Surgeon: PCleon Gustin MD;  Location: [facility] ORS;  Service: Urology;  Laterality: N/A;  . TONSILLECTOMY    . VASECTOMY     Family History  Problem Relation Age of Onset  . Depression Sister   . Bipolar disorder Other     . Prostate cancer Neg Hx   . Kidney cancer Neg Hx   . Bladder Cancer Neg Hx    History  Sexual Activity  . Sexual activity: No    Outpatient Encounter Prescriptions as of 11/11/2016  Medication Sig  . allopurinol (ZYLOPRIM) 100 MG tablet Take 100 mg by mouth daily.   .Marland KitchenaMILoride (MIDAMOR) 5 MG tablet Take 1 tablet (5 mg total) by mouth daily.  .Marland KitchenamLODipine (NORVASC) 10 MG tablet TAKE 1 TABLET DAILY  . benazepril (LOTENSIN) 40 MG tablet Take 40 mg by mouth daily.   . Colchicine 0.6 MG CAPS Take 0.3 mg by mouth every morning. Take 2 today  . fluticasone (FLONASE) 50 MCG/ACT nasal spray Place into both nostrils daily.  . hydrALAZINE (APRESOLINE) 100 MG tablet Take 100 mg by mouth 3 (three) times daily.  .Marland Kitchenlithium carbonate (ESKALITH) 450 MG CR tablet Take 1 tablet (450 mg total) by mouth daily.  . Multiple Vitamin (MULTIVITAMIN) tablet Take 1 tablet by mouth daily.  .Marland KitchenVITAMIN D, CHOLECALCIFEROL, PO Take 1 tablet by mouth daily.   .Marland KitchenFOLIC ACID PO Take by mouth.   No facility-administered encounter medications on file as of 11/11/2016.     Activities of Daily Living In your present state  of health, do you have any difficulty performing the following activities: 11/11/2016  Hearing? N  Vision? N  Difficulty concentrating or making decisions? Y  Walking or climbing stairs? N  Dressing or bathing? N  Doing errands, shopping? N  Preparing Food and eating ? N  Using the Toilet? N  In the past six months, have you accidently leaked urine? N  Do you have problems with loss of bowel control? N  Managing your Medications? N  Managing your Finances? N  Housekeeping or managing your Housekeeping? N  Some recent data might be hidden    Patient Care Team: Guadalupe Maple, MD as PCP - General (Family Medicine) Anthonette Legato, MD (Internal Medicine) Ulis Rias, MD (Psychiatry) Elease Etienne, MD (Internal Medicine) Laneta Simmers as Physician Assistant (Urology) Leia Alf, MD (Inactive) as Attending Physician (Internal Medicine)   Assessment:     Exercise Activities and Dietary recommendations Current Exercise Habits: The patient does not participate in regular exercise at present, Exercise limited by: orthopedic condition(s) (knee pain)  Goals    None     Fall Risk Fall Risk  11/11/2016 11/08/2016 05/13/2015  Falls in the past year? No No No   Depression Screen PHQ 2/9 Scores 11/11/2016 11/08/2016 11/11/2015 05/13/2015  PHQ - 2 Score 0 0 0 0  PHQ- 9 Score - - 0 -    Cognitive Function     6CIT Screen 11/11/2016  What Year? 0 points  What month? 0 points  What time? 0 points  Count back from 20 0 points  Months in reverse 0 points  Repeat phrase 0 points  Total Score 0    Immunization History  Administered Date(s) Administered  . Influenza, High Dose Seasonal PF 01/02/2016  . Influenza,inj,Quad PF,6+ Mos 05/13/2015  . Influenza-Unspecified 02/27/2014  . Pneumococcal Conjugate-13 03/20/2014  . Pneumococcal-Unspecified 11/05/2004, 04/29/2009  . Td 08/12/2011  . Zoster 10/07/2009   Screening Tests Health Maintenance  Topic Date Due  . INFLUENZA VACCINE  10/20/2016  . Hepatitis C Screening  12/20/2016 (Originally April 22, 1945)  . TETANUS/TDAP  08/11/2021  . COLONOSCOPY  12/22/2024      Plan:    I have personally reviewed and addressed the Medicare Annual Wellness questionnaire and have noted the following in the patient's chart:  A. Medical and social history B. Use of alcohol, tobacco or illicit drugs  C. Current medications and supplements D. Functional ability and status E.  Nutritional status F.  Physical activity G. Advance directives H. List of other physicians I.  Hospitalizations, surgeries, and ER visits in previous 12 months J.  [place] such as hearing and vision if needed, cognitive and depression L. Referrals and appointments  In addition, I have reviewed and discussed with patient certain  preventive protocols, quality metrics, and best practice recommendations. A written personalized care plan for preventive services as well as general preventive health recommendations were provided to patient.   Signed,  Tyler Aas, LPN Nurse Health Advisor   MD Recommendations: Discussed with you today: Patient pulse is 73, pt informed this is good and to keep tracking pulse at home and call with any problems.

## 2016-11-11 NOTE — Patient Instructions (Signed)
Luis Miller , Thank you for taking time to come for your Medicare Wellness Visit. I appreciate your ongoing commitment to your health goals. Please review the following plan we discussed and let me know if I can assist you in the future.   Screening recommendations/referrals: Colonoscopy: completed 12/23/2014 Recommended yearly ophthalmology/optometry visit for glaucoma screening and checkup Recommended yearly dental visit for hygiene and checkup  Vaccinations: Influenza vaccine: up to date, due 11/2016 Pneumococcal vaccine: up to date Tdap vaccine: up to date Shingles vaccine: up to date  Advanced directives: copy on file, you requested no changes that need to be made at this time.   Conditions/risks identified: none  Next appointment: Follow up in one year for your annual wellness exam.   Preventive Care 72 Years and Older, Male Preventive care refers to lifestyle choices and visits with your health care provider that can promote health and wellness. What does preventive care include?  A yearly physical exam. This is also called an annual well check.  Dental exams once or twice a year.  Routine eye exams. Ask your health care provider how often you should have your eyes checked.  Personal lifestyle choices, including:  Daily care of your teeth and gums.  Regular physical activity.  Eating a healthy diet.  Avoiding tobacco and drug use.  Limiting alcohol use.  Practicing safe sex.  Taking low doses of aspirin every day.  Taking vitamin and mineral supplements as recommended by your health care provider. What happens during an annual well check? The services and screenings done by your health care provider during your annual well check will depend on your age, overall health, lifestyle risk factors, and family history of disease. Counseling  Your health care provider may ask you questions about your:  Alcohol use.  Tobacco use.  Drug use.  Emotional  well-being.  Home and relationship well-being.  Sexual activity.  Eating habits.  History of falls.  Memory and ability to understand (cognition).  Work and work Statistician. Screening  You may have the following tests or measurements:  Height, weight, and BMI.  Blood pressure.  Lipid and cholesterol levels. These may be checked every 5 years, or more frequently if you are over 80 years old.  Skin check.  Lung cancer screening. You may have this screening every year starting at age 66 if you have a 30-pack-year history of smoking and currently smoke or have quit within the past 15 years.  Fecal occult blood test (FOBT) of the stool. You may have this test every year starting at age 35.  Flexible sigmoidoscopy or colonoscopy. You may have a sigmoidoscopy every 5 years or a colonoscopy every 10 years starting at age 42.  Prostate cancer screening. Recommendations will vary depending on your family history and other risks.  Hepatitis C blood test.  Hepatitis B blood test.  Sexually transmitted disease (STD) testing.  Diabetes screening. This is done by checking your blood sugar (glucose) after you have not eaten for a while (fasting). You may have this done every 1-3 years.  Abdominal aortic aneurysm (AAA) screening. You may need this if you are a current or former smoker.  Osteoporosis. You may be screened starting at age 50 if you are at high risk. Talk with your health care provider about your test results, treatment options, and if necessary, the need for more tests. Vaccines  Your health care provider may recommend certain vaccines, such as:  Influenza vaccine. This is recommended every year.  Tetanus,  diphtheria, and acellular pertussis (Tdap, Td) vaccine. You may need a Td booster every 10 years.  Zoster vaccine. You may need this after age 18.  Pneumococcal 13-valent conjugate (PCV13) vaccine. One dose is recommended after age 56.  Pneumococcal  polysaccharide (PPSV23) vaccine. One dose is recommended after age 36. Talk to your health care provider about which screenings and vaccines you need and how often you need them. This information is not intended to replace advice given to you by your health care provider. Make sure you discuss any questions you have with your health care provider. Document Released: 04/04/2015 Document Revised: 11/26/2015 Document Reviewed: 01/07/2015 Elsevier Interactive Patient Education  2017 [employer] Prevention in the Home Falls can cause injuries. They can happen to people of all ages. There are many things you can do to make your home safe and to help prevent falls. What can I do on the outside of my home?  Regularly fix the edges of walkways and driveways and fix any cracks.  Remove anything that might make you trip as you walk through a door, such as a raised step or threshold.  Trim any bushes or trees on the path to your home.  Use bright outdoor lighting.  Clear any walking paths of anything that might make someone trip, such as rocks or tools.  Regularly check to see if handrails are loose or broken. Make sure that both sides of any steps have handrails.  Any raised decks and porches should have guardrails on the edges.  Have any leaves, snow, or ice cleared regularly.  Use sand or salt on walking paths during winter.  Clean up any spills in your garage right away. This includes oil or grease spills. What can I do in the bathroom?  Use night lights.  Install grab bars by the toilet and in the tub and shower. Do not use towel bars as grab bars.  Use non-skid mats or decals in the tub or shower.  If you need to sit down in the shower, use a plastic, non-slip stool.  Keep the floor dry. Clean up any water that spills on the floor as soon as it happens.  Remove soap buildup in the tub or shower regularly.  Attach bath mats securely with double-sided non-slip rug  tape.  Do not have throw rugs and other things on the floor that can make you trip. What can I do in the bedroom?  Use night lights.  Make sure that you have a light by your bed that is easy to reach.  Do not use any sheets or blankets that are too big for your bed. They should not hang down onto the floor.  Have a firm chair that has side arms. You can use this for support while you get dressed.  Do not have throw rugs and other things on the floor that can make you trip. What can I do in the kitchen?  Clean up any spills right away.  Avoid walking on wet floors.  Keep items that you use a lot in easy-to-reach places.  If you need to reach something above you, use a strong step stool that has a grab bar.  Keep electrical cords out of the way.  Do not use floor polish or wax that makes floors slippery. If you must use wax, use non-skid floor wax.  Do not have throw rugs and other things on the floor that can make you trip. What can I do with  my stairs?  Do not leave any items on the stairs.  Make sure that there are handrails on both sides of the stairs and use them. Fix handrails that are broken or loose. Make sure that handrails are as long as the stairways.  Check any carpeting to make sure that it is firmly attached to the stairs. Fix any carpet that is loose or worn.  Avoid having throw rugs at the top or bottom of the stairs. If you do have throw rugs, attach them to the floor with carpet tape.  Make sure that you have a light switch at the top of the stairs and the bottom of the stairs. If you do not have them, ask someone to add them for you. What else can I do to help prevent falls?  Wear shoes that:  Do not have high heels.  Have rubber bottoms.  Are comfortable and fit you well.  Are closed at the toe. Do not wear sandals.  If you use a stepladder:  Make sure that it is fully opened. Do not climb a closed stepladder.  Make sure that both sides of the  stepladder are locked into place.  Ask someone to hold it for you, if possible.  Clearly mark and make sure that you can see:  Any grab bars or handrails.  First and last steps.  Where the edge of each step is.  Use tools that help you move around (mobility aids) if they are needed. These include:  Canes.  Walkers.  Scooters.  Crutches.  Turn on the lights when you go into a dark area. Replace any light bulbs as soon as they burn out.  Set up your furniture so you have a clear path. Avoid moving your furniture around.  If any of your floors are uneven, fix them.  If there are any pets around you, be aware of where they are.  Review your medicines with your doctor. Some medicines can make you feel dizzy. This can increase your chance of falling. Ask your doctor what other things that you can do to help prevent falls. This information is not intended to replace advice given to you by your health care provider. Make sure you discuss any questions you have with your health care provider. Document Released: 01/02/2009 Document Revised: 08/14/2015 Document Reviewed: 04/12/2014 Elsevier Interactive Patient Education  2017 [employer].

## 2016-11-17 NOTE — Telephone Encounter (Signed)
Note closed. No doctor response. Pt was seen on  07-27-16 and  10-25-16

## 2016-11-23 ENCOUNTER — Telehealth: Payer: Self-pay

## 2016-11-23 ENCOUNTER — Other Ambulatory Visit: Payer: Self-pay | Admitting: Psychiatry

## 2016-11-23 MED ORDER — QUETIAPINE FUMARATE 25 MG PO TABS: 25.0000 mg | ORAL_TABLET | Freq: Every day | ORAL | 1 refills | Status: DC

## 2016-11-23 NOTE — Telephone Encounter (Signed)
Med refilled.

## 2016-11-23 NOTE — Telephone Encounter (Signed)
pt asked if he could go back on the seroquel 74m pt states that he has not been sleeping well since he been off completely. pt states he is leaving friday to go out of town and would like to have before then if possible.  pt was last seen on  10-25-16 next appt  01-24-17.

## 2016-11-29 ENCOUNTER — Other Ambulatory Visit: Payer: Self-pay

## 2016-12-02 ENCOUNTER — Ambulatory Visit: Payer: Self-pay | Admitting: Urology

## 2016-12-06 ENCOUNTER — Other Ambulatory Visit: Payer: Self-pay

## 2016-12-06 ENCOUNTER — Other Ambulatory Visit: Payer: Medicare Other

## 2016-12-06 ENCOUNTER — Other Ambulatory Visit: Payer: Self-pay | Admitting: Family Medicine

## 2016-12-06 ENCOUNTER — Telehealth: Payer: Self-pay | Admitting: Psychiatry

## 2016-12-06 DIAGNOSIS — N401 Enlarged prostate with lower urinary tract symptoms: Secondary | ICD-10-CM | POA: Diagnosis not present

## 2016-12-06 MED ORDER — AMLODIPINE BESYLATE 10 MG PO TABS: 10.0000 mg | ORAL_TABLET | Freq: Every day | ORAL | 0 refills | Status: DC

## 2016-12-06 NOTE — Telephone Encounter (Signed)
Changes have been made to reflect [facility].

## 2016-12-06 NOTE — Telephone Encounter (Signed)
Patient needs refill sent to [facility] 90 day supply of Amolodopine 70m    Thank you

## 2016-12-06 NOTE — Telephone Encounter (Signed)
Routing to provider. No follow up on file.

## 2016-12-07 LAB — PSA: Prostate Specific Ag, Serum: 5.6 ng/mL — ABNORMAL HIGH (ref 0.0–4.0)

## 2016-12-07 NOTE — Progress Notes (Deleted)
12/09/2016 9:19 PM   Luis Miller. [birth year] [id number]  Referring provider: Guadalupe Maple, MD [address] [place], [place] [address]  No chief complaint on file.   HPI: 71 yo WM with an elevated PSA, ED and BPH with LU TS who presents today for a one year follow up.  Elevated PSA Patient had a negative bx in 2002 for a PSA of 4.03 ng/mL.  His current PSA is 5.6 ng/mL on 12/06/2016.Marland Kitchen  He has had a < 0.75 ng/mL rise per year.     Erectile dysfunction IPP placement for a malfunction IPP on 08/25/2015.  He is experiencing ejaculatory disorders.  He only ejaculates on occasion.  He is having orgasms.  He was recently started on cyproheptadine.  He reports ***.    BPH WITH LUTS His IPSS score today is ***, which is *** lower urinary tract symptomatology. He is *** with his quality life due to his urinary symptoms.  His previous I PSS score was 4/0.  He denies any dysuria, hematuria or suprapubic pain.  He also denies any recent fevers, chills, nausea or vomiting.  He is adopted.     Score:  1-7 Mild 8-19 Moderate 20-35 Severe    PMH: Past Medical History:  Diagnosis Date  . Anemia   . Apnea, sleep   . Bipolar affective ([place])   . Cancer ([place]) malignant melanoma  . Crohn's disease ([place])   . Depression   . Diabetes insipidus ([place])   . Erectile dysfunction   . Hyperlipidemia   . Hypertension   . IBS (irritable bowel syndrome)   . Impotence   . Internal hemorrhoids   . 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[place])   . Paraphimosis   . Peyronie disease   . Secondary hyperparathyroidism of renal origin ([place])   . Skin cancer   . Testicular hypofunction   . Urinary frequency   . Urinary hesitancy     Surgical History: Past Surgical History:  Procedure Laterality Date  . arm fracture    . BONE MARROW BIOPSY    . COLONOSCOPY WITH PROPOFOL N/A 12/23/2014   Procedure: COLONOSCOPY WITH PROPOFOL;  Surgeon: Manya Silvas, MD;  Location: [facility] ENDOSCOPY;  Service:  Endoscopy;  Laterality: N/A;  . MELANOMA EXCISION    . PENILE PROSTHESIS IMPLANT    . PENILE PROSTHESIS IMPLANT N/A 08/25/2015   Procedure: REPLACEMENT OF INFLATABLE PENILE PROSTHESIS COMPONENTS;  Surgeon: Cleon Gustin, MD;  Location: [facility] ORS;  Service: Urology;  Laterality: N/A;  . TONSILLECTOMY    . VASECTOMY      Home Medications:  Allergies as of 12/09/2016      Reactions   Mirtazapine Rash   Olanzapine Rash   Atomoxetine Other (See Comments)   Urinary sx   Strattera [atomoxetine Hcl] Other (See Comments)   Urinary sx      Medication List       Accurate as of 12/07/16  9:19 PM. Always use your most recent med list.          allopurinol 100 MG tablet Commonly known as:  ZYLOPRIM Take 100 mg by mouth daily.   aMILoride 5 MG tablet Commonly known as:  MIDAMOR Take 1 tablet (5 mg total) by mouth daily.   amLODipine 10 MG tablet Commonly known as:  NORVASC Take 1 tablet (10 mg total) by mouth daily.   benazepril 40 MG tablet Commonly known as:  LOTENSIN Take 40 mg by mouth daily.   Colchicine 0.6  MG Caps Take 0.3 mg by mouth every morning. Take 2 today   fluticasone 50 MCG/ACT nasal spray Commonly known as:  FLONASE Place into both nostrils daily.   FOLIC ACID PO Take by mouth.   hydrALAZINE 100 MG tablet Commonly known as:  APRESOLINE Take 100 mg by mouth 3 (three) times daily.   lithium carbonate 450 MG CR tablet Commonly known as:  ESKALITH Take 1 tablet (450 mg total) by mouth daily.   multivitamin tablet Take 1 tablet by mouth daily.   QUEtiapine 25 MG tablet Commonly known as:  SEROQUEL Take 1 tablet (25 mg total) by mouth at bedtime.   VITAMIN D (CHOLECALCIFEROL) PO Take 1 tablet by mouth daily.       Allergies:  Allergies  Allergen Reactions  . Mirtazapine Rash  . Olanzapine Rash  . Atomoxetine Other (See Comments)    Urinary sx  . Strattera [Atomoxetine Hcl] Other (See Comments)    Urinary sx    Family History: Family  History  Problem Relation Age of Onset  . Depression Sister   . Bipolar disorder Other   . Prostate cancer Neg Hx   . Kidney cancer Neg Hx   . Bladder Cancer Neg Hx     Social History:  reports that he quit smoking about 44 years ago. His smoking use included Cigarettes. He has never used smokeless tobacco. He reports that he does not drink alcohol or use drugs.  ROS:                                        Physical Exam: There were no vitals taken for this visit.  Constitutional: Well nourished. Alert and oriented, No acute distress. HEENT: [place] AT, moist mucus membranes. Trachea midline, no masses. Cardiovascular: No clubbing, cyanosis, or edema. Respiratory: Normal respiratory effort, no increased work of breathing. GI: Abdomen is soft, non tender, non distended, no abdominal masses. Liver and spleen not palpable.  No hernias appreciated.  Stool sample for occult testing is not indicated.   GU: No CVA tenderness.  No bladder fullness or masses.  Patient with circumcised phallus.  Urethral meatus is patent.  No penile discharge. No penile lesions or rashes.  Glands floppy.  Cylinders in place.  Scrotum without lesions, cysts, rashes and/or edema.  Testicles are located scrotally bilaterally. Pump palpable high within the left hemiscrotum.  No masses are appreciated in the testicles. Left and right epididymis are normal. Rectal: Patient with  normal sphincter tone. Anus and perineum without scarring or rashes. No rectal masses are appreciated. Prostate is approximately 50 grams, no nodules are appreciated. Seminal vesicles are normal. Skin: No rashes, bruises or suspicious lesions. Lymph: No cervical or inguinal adenopathy. Neurologic: Grossly intact, no focal deficits, moving all 4 extremities. Psychiatric: Normal mood and affect.  Laboratory Data: Lab Results  Component Value Date   WBC 5.0 06/07/2016   HGB 12.9 (L) 06/07/2016   HCT 38.4 06/07/2016   MCV  101 (H) 06/07/2016   PLT 160 06/07/2016    Lab Results  Component Value Date   CREATININE 2.28 (H) 06/07/2016    Lab Results  Component Value Date   TSH 0.419 (L) 06/07/2016       Component Value Date/Time   CHOL 193 06/07/2016 1548   HDL 35 (L) 06/07/2016 1548   CHOLHDL 4.8 09/09/2014 1052   [place] 95 06/07/2016 1548  Lab Results  Component Value Date   AST 19 06/07/2016   Lab Results  Component Value Date   ALT 19 06/07/2016   PSA History  5.6 ng/mL on 11/27/2013  4.9 ng/mL on 05/28/2014  5.7 ng/mL on 09/09/2014  5.5 ng/mL on 06/03/2015  6.4 ng/mL on 11/11/2015  6.4 ng/mL on 05/31/2016  5.6 ng/mL on 12/06/2016  Assessment & Plan:    1. Elevated PSA  - PSA continue a slight upward trend at < 0.75 ng/mL per year  - RTC in 6 months for exam and PSA  2. Erectile dysfunction  - Satisfied with IPP  - RTC in one year for exam  3. Ejaculatory disorder  - explained to the patient that this is a multi-factorial issue   - will give a trial of Viagra 100 mg  4. BPH with LUTS  - IPSS score is 4/0, it is improving  - Continue conservative management, avoiding bladder irritants and timed voiding's  - RTC in 6 months for IPSS, PSA and exam    No Follow-up on file.  These notes generated with voice recognition software. I apologize for typographical errors.  Zara Council, [facility] [address], [place], [place] [address] [phone]

## 2016-12-09 ENCOUNTER — Encounter: Payer: Self-pay | Admitting: Urology

## 2016-12-09 ENCOUNTER — Ambulatory Visit: Payer: Self-pay | Admitting: Urology

## 2016-12-13 DIAGNOSIS — N183 Chronic kidney disease, stage 3 (moderate): Secondary | ICD-10-CM | POA: Diagnosis not present

## 2016-12-13 DIAGNOSIS — N2581 Secondary hyperparathyroidism of renal origin: Secondary | ICD-10-CM | POA: Diagnosis not present

## 2016-12-13 DIAGNOSIS — I10 Essential (primary) hypertension: Secondary | ICD-10-CM | POA: Diagnosis not present

## 2016-12-13 DIAGNOSIS — R809 Proteinuria, unspecified: Secondary | ICD-10-CM | POA: Diagnosis not present

## 2016-12-13 DIAGNOSIS — E232 Diabetes insipidus: Secondary | ICD-10-CM | POA: Diagnosis not present

## 2016-12-14 NOTE — Progress Notes (Signed)
12/15/2016 3:08 PM   [name] [birth year] [id number]  Referring provider: Guadalupe Maple, MD [address] [place], [place] [address]  Chief Complaint  Patient presents with  . Elevated PSA    6 month    HPI: 71 yo WM with an elevated PSA, ED and BPH with LU TS who presents today for a one year follow up.  Elevated PSA Patient had a negative bx in 2002 for a PSA of 4.03 ng/mL.  His current PSA is 5.6 ng/mL on 12/06/2016.Marland Kitchen  He has had a < 0.75 ng/mL rise per year.     Erectile dysfunction IPP placement for a malfunction IPP on 08/25/2015.  He is experiencing ejaculatory disorders.  He only ejaculates on occasion.  He is having orgasms.  He was recently started on cyproheptadine.  He reports that it is helping with orgasm.    BPH WITH LUTS His IPSS score today is 5, which is mild lower urinary tract symptomatology. He is pleased with his quality life due to his urinary symptoms.  His previous I PSS score was 4/0.  He denies any dysuria, hematuria or suprapubic pain.  He also denies any recent fevers, chills, nausea or vomiting.  He is adopted.       IPSS    Row Name 12/15/16 1300         International Prostate Symptom Score   How often have you had the sensation of not emptying your bladder? Not at All     How often have you had to urinate less than every two hours? Less than half the time     How often have you found you stopped and started again several times when you urinated? Not at All     How often have you found it difficult to postpone urination? Less than 1 in 5 times     How often have you had a weak urinary stream? Not at All     How often have you had to strain to start urination? Not at All     How many times did you typically get up at night to urinate? 2 Times     Total IPSS Score 5       Quality of Life due to urinary symptoms   If you were to spend the rest of your life with your urinary condition just the way it is now how would you feel about  that? Pleased        Score:  1-7 Mild 8-19 Moderate 20-35 Severe    PMH: Past Medical History:  Diagnosis Date  . Anemia   . Apnea, sleep   . Bipolar affective ([place])   . Cancer ([place]) malignant melanoma  . Crohn's disease ([place])   . Depression   . Diabetes insipidus ([place])   . Erectile dysfunction   . Hyperlipidemia   . Hypertension   . IBS (irritable bowel syndrome)   . Impotence   . Internal hemorrhoids   . 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 Paraphimosis   . Peyronie disease   . Secondary hyperparathyroidism of renal origin ([place])   . Skin cancer   . Testicular hypofunction   . Urinary frequency   . Urinary hesitancy     Surgical History: Past Surgical History:  Procedure Laterality Date  . arm fracture    . BONE MARROW BIOPSY    . COLONOSCOPY WITH PROPOFOL N/A 12/23/2014   Procedure: COLONOSCOPY WITH PROPOFOL;  Surgeon: [name]  Federico Flake, MD;  Location: [facility] ENDOSCOPY;  Service: Endoscopy;  Laterality: N/A;  . MELANOMA EXCISION    . PENILE PROSTHESIS IMPLANT    . PENILE PROSTHESIS IMPLANT N/A 08/25/2015   Procedure: REPLACEMENT OF INFLATABLE PENILE PROSTHESIS COMPONENTS;  Surgeon: Cleon Gustin, MD;  Location: [facility];  Service: Urology;  Laterality: N/A;  . TONSILLECTOMY    . VASECTOMY      Home Medications:  Allergies as of 12/15/2016      Reactions   Mirtazapine Rash   Olanzapine Rash   Atomoxetine Other (See Comments)   Urinary sx   Strattera [atomoxetine Hcl] Other (See Comments)   Urinary sx      Medication List       Accurate as of 12/15/16  3:08 PM. Always use your most recent med list.          allopurinol 100 MG tablet Commonly known as:  ZYLOPRIM Take 100 mg by mouth daily.   aMILoride 5 MG tablet Commonly known as:  MIDAMOR Take 1 tablet (5 mg total) by mouth daily.   amLODipine 10 MG tablet Commonly known as:  NORVASC Take 1 tablet (10 mg total) by mouth daily.   benazepril 40 MG tablet Commonly known as:   LOTENSIN Take 40 mg by mouth daily.   Colchicine 0.6 MG Caps Take 0.3 mg by mouth every morning. Take 2 today   cyproheptadine 4 MG tablet Commonly known as:  PERIACTIN Take one tablet three hours prior to intercourse   fluticasone 50 MCG/ACT nasal spray Commonly known as:  FLONASE Place into both nostrils daily.   FOLIC ACID PO Take by mouth.   hydrALAZINE 100 MG tablet Commonly known as:  APRESOLINE Take 100 mg by mouth 3 (three) times daily.   lithium carbonate 450 MG CR tablet Commonly known as:  ESKALITH Take 1 tablet (450 mg total) by mouth daily.   multivitamin tablet Take 1 tablet by mouth daily.   QUEtiapine 25 MG tablet Commonly known as:  SEROQUEL Take 1 tablet (25 mg total) by mouth at bedtime.   VITAMIN D (CHOLECALCIFEROL) PO Take 1 tablet by mouth daily.            Discharge Care Instructions        Start     Ordered   12/15/16 0000  cyproheptadine (PERIACTIN) 4 MG tablet    Question:  Supervising Provider  Answer:  Hollice Espy   12/15/16 1505      Allergies:  Allergies  Allergen Reactions  . Mirtazapine Rash  . Olanzapine Rash  . Atomoxetine Other (See Comments)    Urinary sx  . Strattera [Atomoxetine Hcl] Other (See Comments)    Urinary sx    Family History: Family History  Problem Relation Age of Onset  . Depression Sister   . Bipolar disorder Other   . Prostate cancer Neg Hx   . Kidney cancer Neg Hx   . Bladder Cancer Neg Hx     Social History:  reports that he quit smoking about 44 years ago. His smoking use included Cigarettes. He has never used smokeless tobacco. He reports that he does not drink alcohol or use drugs.  ROS: UROLOGY Frequent Urination?: Yes Hard to postpone urination?: No Burning/pain with urination?: No Get up at night to urinate?: Yes Leakage of urine?: No Urine stream starts and stops?: No Trouble starting stream?: No Do you have to strain to urinate?: No Blood in urine?: No Urinary tract  infection?: No Sexually  transmitted disease?: No Injury to kidneys or bladder?: Yes Painful intercourse?: No Weak stream?: No Erection problems?: No Penile pain?: No  Gastrointestinal Nausea?: No Vomiting?: No Indigestion/heartburn?: No Diarrhea?: No Constipation?: No  Constitutional Fever: No Night sweats?: No Weight loss?: No Fatigue?: No  Skin Skin rash/lesions?: No Itching?: No  Eyes Blurred vision?: No Double vision?: No  Ears/Nose/Throat Sore throat?: No Sinus problems?: No  Hematologic/Lymphatic Swollen glands?: No Easy bruising?: Yes  Cardiovascular Leg swelling?: Yes Chest pain?: No  Respiratory Cough?: No Shortness of breath?: No  Endocrine Excessive thirst?: No  Musculoskeletal Back pain?: No Joint pain?: No  Neurological Headaches?: No Dizziness?: No  Psychologic Depression?: No Anxiety?: No  Physical Exam: BP 133/77   Pulse 66   Ht '5\' 8"'  (1.727 m)   Wt 204 lb (92.5 kg)   BMI 31.02 kg/m   Constitutional: Well nourished. Alert and oriented, No acute distress. HEENT: [place] AT, moist mucus membranes. Trachea midline, no masses. Cardiovascular: No clubbing, cyanosis, or edema. Respiratory: Normal respiratory effort, no increased work of breathing. GI: Abdomen is soft, non tender, non distended, no abdominal masses. Liver and spleen not palpable.  No hernias appreciated.  Stool sample for occult testing is not indicated.   GU: No CVA tenderness.  No bladder fullness or masses.  Patient with circumcised phallus.  Urethral meatus is patent.  No penile discharge. No penile lesions or rashes.  Glands floppy.  Cylinders in place.  Scrotum without lesions, cysts, rashes and/or edema.  Testicles are located scrotally bilaterally. Pump palpable high within the left hemiscrotum.  No masses are appreciated in the testicles. Left and right epididymis are normal. Rectal: Patient with  normal sphincter tone. Anus and perineum without scarring or  rashes. No rectal masses are appreciated. Prostate is approximately 50 grams, no nodules are appreciated. Seminal vesicles are normal. Skin: No rashes, bruises or suspicious lesions. Lymph: No cervical or inguinal adenopathy. Neurologic: Grossly intact, no focal deficits, moving all 4 extremities. Psychiatric: Normal mood and affect.  Laboratory Data: Lab Results  Component Value Date   WBC 5.0 06/07/2016   HGB 12.9 (L) 06/07/2016   HCT 38.4 06/07/2016   MCV 101 (H) 06/07/2016   PLT 160 06/07/2016    Lab Results  Component Value Date   CREATININE 2.28 (H) 06/07/2016    Lab Results  Component Value Date   TSH 0.419 (L) 06/07/2016       Component Value Date/Time   CHOL 193 06/07/2016 1548   HDL 35 (L) 06/07/2016 1548   CHOLHDL 4.8 09/09/2014 1052   LDLCALC 95 06/07/2016 1548    Lab Results  Component Value Date   AST 19 06/07/2016   Lab Results  Component Value Date   ALT 19 06/07/2016   PSA History  5.6 ng/mL on 11/27/2013  4.9 ng/mL on 05/28/2014  5.7 ng/mL on 09/09/2014  5.5 ng/mL on 06/03/2015  6.4 ng/mL on 11/11/2015  6.4 ng/mL on 05/31/2016  5.6 ng/mL on 12/06/2016  Assessment & Plan:    1. Elevated PSA  - PSA currently is 5.6  - RTC in 6 months for exam and PSA  2. Erectile dysfunction  - Satisfied with IPP  - RTC in 6 months for exam  3. Ejaculatory disorder  - explained to the patient that this is a multi-factorial issue   - script for cyproheptadine 4 mg, take one tablet three hours prior to intercourse sent to pharmacy  4. BPH with LUTS  - IPSS score is 5/1, it  is slightly worse  - Continue conservative management, avoiding bladder irritants and timed voiding's  - RTC in 6 months for IPSS, PSA and exam    Return in about 6 months (around 06/14/2017) for IPSS, PSA and exam.  These notes generated with voice recognition software. I apologize for typographical errors.  Zara Council, [facility] [address] [place], [place] [address] [phone]

## 2016-12-15 ENCOUNTER — Ambulatory Visit (INDEPENDENT_AMBULATORY_CARE_PROVIDER_SITE_OTHER): Payer: Medicare Other | Admitting: Urology

## 2016-12-15 ENCOUNTER — Encounter: Payer: Self-pay | Admitting: Urology

## 2016-12-15 VITALS — BP 133/77 | HR 66 | Ht 68.0 in | Wt 204.0 lb

## 2016-12-15 DIAGNOSIS — N138 Other obstructive and reflux uropathy: Secondary | ICD-10-CM

## 2016-12-15 DIAGNOSIS — N401 Enlarged prostate with lower urinary tract symptoms: Secondary | ICD-10-CM

## 2016-12-15 DIAGNOSIS — N5319 Other ejaculatory dysfunction: Secondary | ICD-10-CM | POA: Diagnosis not present

## 2016-12-15 DIAGNOSIS — N529 Male erectile dysfunction, unspecified: Secondary | ICD-10-CM

## 2016-12-15 DIAGNOSIS — R972 Elevated prostate specific antigen [PSA]: Secondary | ICD-10-CM

## 2016-12-15 MED ORDER — CYPROHEPTADINE HCL 4 MG PO TABS: ORAL_TABLET | ORAL | 3 refills | Status: DC

## 2016-12-20 ENCOUNTER — Telehealth: Payer: Self-pay

## 2016-12-20 DIAGNOSIS — N5319 Other ejaculatory dysfunction: Secondary | ICD-10-CM

## 2016-12-20 MED ORDER — LEVOCETIRIZINE DIHYDROCHLORIDE 5 MG PO TABS: 5.0000 mg | ORAL_TABLET | Freq: Every evening | ORAL | 3 refills | Status: DC

## 2016-12-20 NOTE — Telephone Encounter (Signed)
Spoke with Dr. Alyson Ingles personally in reference to medications and insurance coverage. Dr. Alyson Ingles said that levocetirizine would help pt but it is the last medication that is used for ejaculatory disorders. Dr. Alyson Ingles stated that levocetirizine does not work as well as cyproheptadine and pt should go back to original medication if needed. Spoke with pt in reference to medications and made aware will call new medication in to pharmacy. Also made aware if new medication does not to work to call [facility] back. Pt voiced understanding of whole conversation.

## 2016-12-20 NOTE — Telephone Encounter (Signed)
Please have the patient contact Dr. Noland Fordyce office as I refilled the cyproheptadine as the patient was having success with the medication.  I am not familiar with this medication and am unsure if levocetirizine would be a suitable replacement.

## 2016-12-20 NOTE — Telephone Encounter (Signed)
Pt pharmacy, [facility], called stating the cyproheptadine 4 mg is not covered on pt medication plan. [facility] stated they will cover levocetirizine and requested to change medication. Please advise.

## 2016-12-20 NOTE — Telephone Encounter (Signed)
Spoke with patient and gave message. Patient to call Dr. Noland Fordyce office and find out and have a staff member from that office to call back with that information because per Larene Beach will write the rx for him. Patient ok with the plan.

## 2016-12-22 ENCOUNTER — Telehealth: Payer: Self-pay | Admitting: Urology

## 2016-12-22 NOTE — Telephone Encounter (Signed)
Pt called office asking to speak to a nurse about directions of how to use a new medication. Please advise. Thanks.

## 2016-12-23 NOTE — Telephone Encounter (Signed)
Per Dr. Alyson Ingles pt should take medicine a couple of hours prior to intercourse. Spoke with pt and made aware. Pt voiced understanding.

## 2016-12-24 DIAGNOSIS — N183 Chronic kidney disease, stage 3 (moderate): Secondary | ICD-10-CM | POA: Diagnosis not present

## 2016-12-24 DIAGNOSIS — N2581 Secondary hyperparathyroidism of renal origin: Secondary | ICD-10-CM | POA: Diagnosis not present

## 2016-12-24 DIAGNOSIS — E232 Diabetes insipidus: Secondary | ICD-10-CM | POA: Diagnosis not present

## 2016-12-24 DIAGNOSIS — I10 Essential (primary) hypertension: Secondary | ICD-10-CM | POA: Diagnosis not present

## 2016-12-28 ENCOUNTER — Telehealth: Payer: Self-pay

## 2016-12-28 ENCOUNTER — Other Ambulatory Visit: Payer: Self-pay | Admitting: Psychiatry

## 2016-12-28 NOTE — Telephone Encounter (Signed)
Please advise 

## 2016-12-28 NOTE — Telephone Encounter (Signed)
pt called stated he needs [facility]. he states that medication is working fine. pt was seen on  10-25-16 next appt 01-24-17

## 2016-12-28 NOTE — Telephone Encounter (Signed)
Will defer this to Dr. Gretel Acre

## 2016-12-29 DIAGNOSIS — M1712 Unilateral primary osteoarthritis, left knee: Secondary | ICD-10-CM | POA: Diagnosis not present

## 2016-12-29 DIAGNOSIS — M25562 Pain in left knee: Secondary | ICD-10-CM | POA: Diagnosis not present

## 2017-01-04 NOTE — Telephone Encounter (Signed)
pt called states he needs a refill on his quetiapine

## 2017-01-12 ENCOUNTER — Ambulatory Visit (INDEPENDENT_AMBULATORY_CARE_PROVIDER_SITE_OTHER): Payer: Medicare Other

## 2017-01-12 DIAGNOSIS — Z23 Encounter for immunization: Secondary | ICD-10-CM | POA: Diagnosis not present

## 2017-01-13 NOTE — Telephone Encounter (Signed)
pt called needs to get a refill on [facility]oquel [facility] before he runs out next week. states it comes in the mail.

## 2017-01-14 DIAGNOSIS — L570 Actinic keratosis: Secondary | ICD-10-CM | POA: Diagnosis not present

## 2017-01-14 DIAGNOSIS — L821 Other seborrheic keratosis: Secondary | ICD-10-CM | POA: Diagnosis not present

## 2017-01-14 DIAGNOSIS — X32XXXA Exposure to sunlight, initial encounter: Secondary | ICD-10-CM | POA: Diagnosis not present

## 2017-01-14 DIAGNOSIS — D2371 Other benign neoplasm of skin of right lower limb, including hip: Secondary | ICD-10-CM | POA: Diagnosis not present

## 2017-01-14 DIAGNOSIS — D485 Neoplasm of uncertain behavior of skin: Secondary | ICD-10-CM | POA: Diagnosis not present

## 2017-01-14 DIAGNOSIS — L918 Other hypertrophic disorders of the skin: Secondary | ICD-10-CM | POA: Diagnosis not present

## 2017-01-17 MED ORDER — QUETIAPINE FUMARATE 25 MG PO TABS: 25.0000 mg | ORAL_TABLET | Freq: Every day | ORAL | 0 refills | Status: DC

## 2017-01-17 NOTE — Telephone Encounter (Signed)
Pt needs at least a weeks worth of medication until his 90 day supply comes in.

## 2017-01-17 NOTE — Telephone Encounter (Signed)
pt called states he [facility] and a 90 to mail order pharmacy.  pt has appt next week. but he states he is out of his quetiapine.  After looking it appears pt should have another refill at [facility] but he needs a 90 day supply mail order pharmacy.

## 2017-01-17 NOTE — Telephone Encounter (Signed)
Will defer to Dr. Gretel Acre

## 2017-01-17 NOTE — Telephone Encounter (Signed)
Thanks. Pt called and notified.

## 2017-01-17 NOTE — Telephone Encounter (Signed)
Per Dr. Casimiro Needle a 90 day order of patients Quetiapine was sent to [facility].

## 2017-01-18 ENCOUNTER — Telehealth: Payer: Self-pay | Admitting: Urology

## 2017-01-18 NOTE — Telephone Encounter (Signed)
Pt needs at least a weeks worth of medication until he gets his 90 day supply

## 2017-01-18 NOTE — Telephone Encounter (Signed)
Patient called the office today requesting an update on his insurance authorization for cyproheptadine (periactin).  He tried the substitution medication that the insurance suggested and it does not work.    He states that he will pay for cyproheptadine out of pocket if insurance will not pay for it, but would like for [place] to submit an authorization request on his behalf.   He can be reached at [phone].

## 2017-01-18 NOTE — Telephone Encounter (Signed)
Ask the pharmacy first if they have medication order- it appears Dr. Gretel Acre ordered 30 days with one refill in the past.

## 2017-01-21 ENCOUNTER — Telehealth: Payer: Self-pay | Admitting: Psychiatry

## 2017-01-24 ENCOUNTER — Other Ambulatory Visit: Payer: Self-pay

## 2017-01-24 ENCOUNTER — Ambulatory Visit: Payer: Medicare Other | Admitting: Psychiatry

## 2017-01-24 ENCOUNTER — Encounter: Payer: Self-pay | Admitting: Psychiatry

## 2017-01-24 VITALS — BP 165/78 | HR 80 | Temp 98.0°F | Wt 202.0 lb

## 2017-01-24 DIAGNOSIS — F3160 Bipolar disorder, current episode mixed, unspecified: Secondary | ICD-10-CM | POA: Diagnosis not present

## 2017-01-24 MED ORDER — QUETIAPINE FUMARATE 25 MG PO TABS: 25.0000 mg | ORAL_TABLET | Freq: Every day | ORAL | 0 refills | Status: DC

## 2017-01-24 MED ORDER — LITHIUM CARBONATE ER 450 MG PO TBCR
450.0000 mg | EXTENDED_RELEASE_TABLET | Freq: Every day | ORAL | 1 refills | Status: DC
Start: 2017-01-24 — End: 2017-04-11

## 2017-01-24 NOTE — Progress Notes (Signed)
Psychiatric MD Progress Note   Patient Identification: Luis Miller. MRN:  [id number] Date of Evaluation:  01/24/2017 Referral Source: Golden Pop, M.D Chief Complaint:   Chief Complaint    Follow-up; Medication Refill     Visit Diagnosis:    ICD-10-CM   1. Bipolar I disorder, most recent episode mixed ([place]) F31.60     History of Present Illness:    Patient is a 71 year old divorced male with history of bipolar disorder presented for follow up. He stated that he has resumed the Seroquel 25 mg as he reported that he started feeling anxious when he stopped the medication completely. He reported that he called for the refill of the medication. He has recently received the Seroquel 25 mg and he has been feeling better. He appeared calm and alert during the interview. Patient currently denied having any side effects. He reported that he spends time with his girlfriend who lives in [place]. He goes for regular dancing with her. Patient reported that he feels that the lithium and the Seroquel has been helping him. He was concerned about his elevated potassium as he went to see his nephrologist Dr. Holley Raring who has discussed with him about increasing the intake of water. Patient reported that he is trying to drink more water on a regular basis. Patient reported that he does not want to have dialysis in the long run. He currently denied having any suicidal ideations or plans. We discussed about his medications in detail and he agreed with the plan. He is compliant with his medications.   Patient reported that he also has mild tremors which she has noted recently. He is going to follow-up with his primary care physicians about the same..   Associated Signs/Symptoms: Depression Symptoms:  fatigue, loss of energy/fatigue, (Hypo) Manic Symptoms:  denied Anxiety Symptoms:  Excessive Worry, Psychotic Symptoms:  none PTSD Symptoms: Negative NA  Past Psychiatric History:  Long history of bipolar  disorder. He reported that he has seen Dr. Vallarie Mare for many years in the past. ECT Treatment - several  Psychiatric Admission x 4, [facility], [facility], [facility], [place] No h/o Suicide attemtps.   Previous Psychotropic Medications:  Lithium Mellaril Seroquel Does not remember the names.   Allergies- Remeron Olanzapine Strattera   Substance Abuse History in the last 12 months:  No.  Consequences of Substance Abuse: Negative NA  Past Medical History:  Past Medical History:  Diagnosis Date  . Anemia   . Apnea, sleep   . Bipolar affective ([place])   . Cancer ([place]) malignant melanoma  . Crohn's disease ([place])   . Depression   . Diabetes insipidus ([place])   . Erectile dysfunction   . Hyperlipidemia   . Hypertension   . IBS (irritable bowel syndrome)   . Impotence   . Internal hemorrhoids   . 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[place])   . Paraphimosis   . Peyronie disease   . Secondary hyperparathyroidism of renal origin ([place])   . Skin cancer   . Testicular hypofunction   . Urinary frequency   . Urinary hesitancy     Past Surgical History:  Procedure Laterality Date  . arm fracture    . BONE MARROW BIOPSY    . MELANOMA EXCISION    . PENILE PROSTHESIS IMPLANT    . TONSILLECTOMY    . VASECTOMY      Family Psychiatric History:  He reported bipolar disorder in his sister and nephew.  Family History:  Family History  Problem Relation Age  of Onset  . Depression Sister   . Bipolar disorder Other   . Prostate cancer Neg Hx   . Kidney cancer Neg Hx   . Bladder Cancer Neg Hx     Social History:   Social History   Socioeconomic History  . Marital status: Divorced    Spouse name: None  . Number of children: 2  . Years of education: None  . Highest education level: High school graduate  Social Needs  . Financial resource strain: Not hard at all  . Food insecurity - worry: Never true  . Food insecurity - inability: Never true  . Transportation needs - medical: No  .  Transportation needs - non-medical: No  Occupational History  . Occupation: retired  Tobacco Use  . Smoking status: Former Smoker    Types: Cigarettes    Last attempt to quit: 09/08/1972    Years since quitting: 44.4  . Smokeless tobacco: Never Used  Substance and Sexual Activity  . Alcohol use: No    Alcohol/week: 0.0 oz  . Drug use: No  . Sexual activity: No  Other Topics Concern  . None  Social History Narrative  . None    Additional Social History:  Divorced- x 2 Lives with GF, she is 62 Retired- from [employer].    Allergies:   Allergies  Allergen Reactions  . Mirtazapine Rash  . Olanzapine Rash  . Atomoxetine Other (See Comments)    Urinary sx  . Strattera [Atomoxetine Hcl] Other (See Comments)    Urinary sx    Metabolic Disorder Labs: No results found for: HGBA1C, MPG No results found for: PROLACTIN Lab Results  Component Value Date   CHOL 193 06/07/2016   TRIG 315 (H) 06/07/2016   HDL 35 (L) 06/07/2016   CHOLHDL 4.8 09/09/2014   [place] 95 06/07/2016   LDLCALC 98 11/11/2015     Current Medications: Current Outpatient Medications  Medication Sig Dispense Refill  . allopurinol (ZYLOPRIM) 100 MG tablet Take 100 mg by mouth daily.     Marland Kitchen aMILoride (MIDAMOR) 5 MG tablet Take 1 tablet (5 mg total) by mouth daily. 90 tablet 4  . amLODipine (NORVASC) 10 MG tablet Take 1 tablet (10 mg total) by mouth daily. 90 tablet 0  . benazepril (LOTENSIN) 40 MG tablet Take 40 mg by mouth daily.     . Colchicine 0.6 MG CAPS Take 0.3 mg by mouth every morning. Take 2 today 30 capsule 3  . cyproheptadine (PERIACTIN) 4 MG tablet Take one tablet three hours prior to intercourse 90 tablet 3  . fluticasone (FLONASE) 50 MCG/ACT nasal spray Place into both nostrils daily.    Marland Kitchen FOLIC ACID PO Take by mouth.    . hydrALAZINE (APRESOLINE) 100 MG tablet Take 100 mg by mouth 3 (three) times daily.    Marland Kitchen levocetirizine (XYZAL) 5 MG tablet Take 1 tablet (5 mg total) by mouth every evening. 30  tablet 3  . lithium carbonate (ESKALITH) 450 MG CR tablet Take 1 tablet (450 mg total) daily by mouth. 90 tablet 1  . Multiple Vitamin (MULTIVITAMIN) tablet Take 1 tablet by mouth daily.    . QUEtiapine (SEROQUEL) 25 MG tablet Take 1 tablet (25 mg total) at bedtime by mouth. 90 tablet 0  . VITAMIN D, CHOLECALCIFEROL, PO Take 1 tablet by mouth daily.      No current facility-administered medications for this visit.     Neurologic: Headache: No Seizure: No Paresthesias:No  Musculoskeletal: Strength & Muscle Tone: within  normal limits Gait & Station: normal Patient leans: N/A  Psychiatric Specialty Exam: ROS  Blood pressure (!) 165/78, pulse 80, temperature 98 F (36.7 C), temperature source Oral, weight 202 lb (91.6 kg).Body mass index is 30.71 kg/m.  General Appearance: Casual  Eye Contact:  Fair  Speech:  Clear and Coherent  Volume:  Normal  Mood:  Anxious  Affect:  Appropriate and Congruent  Thought Process:  Coherent  Orientation:  Full (Time, Place, and Person)  Thought Content:  Logical  Suicidal Thoughts:  No  Homicidal Thoughts:  No  Memory:  Immediate;   Fair Recent;   Fair Remote;   Fair  Judgement:  Fair  Insight:  Fair  Psychomotor Activity:  Psychomotor Retardation  Concentration:  Concentration: Fair and Attention Span: Fair  Recall:  [employer] of Knowledge:Fair  Language: Fair  Akathisia:  No  Handed:  Right  AIMS (if indicated):    Assets:  Armed forces logistics/support/administrative officer Physical Health Social Support  ADL's:  Intact  Cognition: WNL  Sleep:  good    Treatment Plan Summary: Medication management  Continue medication as follows Continue lithium carbonate 450 mg Daily at bedtime  Start Seroquel 25 mg by mouth daily    He will follow-up in 3 months or earlier depending on his symptoms.       More than 50% of the time spent in psychoeducation, counseling and coordination of care.    This note was generated in part or whole with voice recognition  software. Voice regonition is usually quite accurate but there are transcription errors that can and very often do occur. I apologize for any typographical errors that were not detected and corrected.    Rainey Pines, MD 11/5/201810:33 AM

## 2017-01-24 NOTE — Telephone Encounter (Signed)
Pt has 1 month worth of meds left.  The Xyzal didn't work for him.  He would like to stay on Periactin, if insurance will approve.  Please give pt a call [phone]

## 2017-01-25 NOTE — Telephone Encounter (Signed)
Pt was seen on Monday by dr. Gretel Acre

## 2017-01-31 NOTE — Telephone Encounter (Signed)
Have been trying to get in touch with pt. There is no diagnosis nor documentation about pt medication for a PA to be completed. Per Dr. Erlene Quan pt needs to come in to be seen for another medication and documentation. LMOM

## 2017-02-22 ENCOUNTER — Other Ambulatory Visit: Payer: Self-pay | Admitting: Family Medicine

## 2017-02-23 ENCOUNTER — Encounter: Payer: Self-pay | Admitting: Family Medicine

## 2017-02-23 ENCOUNTER — Ambulatory Visit (INDEPENDENT_AMBULATORY_CARE_PROVIDER_SITE_OTHER): Payer: Medicare Other | Admitting: Family Medicine

## 2017-02-23 VITALS — BP 149/83 | HR 71 | Wt 200.0 lb

## 2017-02-23 DIAGNOSIS — R251 Tremor, unspecified: Secondary | ICD-10-CM | POA: Diagnosis not present

## 2017-02-23 NOTE — Progress Notes (Signed)
   BP (!) 149/83   Pulse 71   Wt 200 lb (90.7 kg)   SpO2 98%   BMI 30.41 kg/m    Subjective:    Patient ID: Luis Coss., male    DOB: [birth year], 71 y.o.   MRN: [id number]  HPI: Luis Aschoff. is a 71 y.o. male  Chief Complaint  Patient presents with  . Cough  . Tremors  . Memory Loss  patient with above concerns cough is been lingering after had episode of upper respiratory infection several weeks ago Now still has a slightly dry and nonproductive cough.Does not feel sick at all.  Patient's noticed tremors worse with eating or other activities especially overhead work. Also concerned about some memory loss issues forgetting what is going to do her get then will remember someone's name after they've already left.  Relevant past medical, surgical, family and social history reviewed and updated as indicated. Interim medical history since our last visit reviewed. Allergies and medications reviewed and updated.  Review of Systems  Constitutional: Negative.   Respiratory: Negative.   Cardiovascular: Negative.     Per HPI unless specifically indicated above     Objective:    BP (!) 149/83   Pulse 71   Wt 200 lb (90.7 kg)   SpO2 98%   BMI 30.41 kg/m   Wt Readings from Last 3 Encounters:  02/23/17 200 lb (90.7 kg)  12/15/16 204 lb (92.5 kg)  11/11/16 201 lb 3.2 oz (91.3 kg)    Physical Exam  Constitutional: He is oriented to person, place, and time. He appears well-developed and well-nourished.  HENT:  Head: Normocephalic and atraumatic.  Eyes: Conjunctivae and EOM are normal.  Neck: Normal range of motion.  Cardiovascular: Normal rate, regular rhythm and normal heart sounds.  Pulmonary/Chest: Effort normal and breath sounds normal.  Musculoskeletal: Normal range of motion.  Neurological: He is alert and oriented to person, place, and time.  Slight tremor noted  Skin: No erythema.  Psychiatric: He has a normal mood and affect. His behavior is normal.  Judgment and thought content normal.  mini-Mental Status exam with score of 29 out of 30 with 1 point off for for spelling world backwards.  Results for orders placed or performed in visit on 12/06/16  PSA  Result Value Ref Range   Prostate Specific Ag, Serum 5.6 (H) 0.0 - 4.0 ng/mL      Assessment & Plan:   Problem List Items Addressed This Visit    None    Visit Diagnoses    Tremor    -  Primary   Relevant Orders   Ambulatory referral to Neurology     patient for follow-up with psychiatry coming up later this winter will review memory loss and tremor with psychiatry to see if any medications he is taking may be involved. We'll also go ahead with neurology referral to evaluate for testing of tremor and possibility of consideration of memory issues.  Follow up plan: Return in about 4 weeks (around 03/23/2017) for Physical Exam this winter.

## 2017-04-04 DIAGNOSIS — M1712 Unilateral primary osteoarthritis, left knee: Secondary | ICD-10-CM | POA: Diagnosis not present

## 2017-04-05 ENCOUNTER — Encounter: Payer: Self-pay | Admitting: Family Medicine

## 2017-04-05 ENCOUNTER — Ambulatory Visit (INDEPENDENT_AMBULATORY_CARE_PROVIDER_SITE_OTHER): Payer: Medicare Other | Admitting: Family Medicine

## 2017-04-05 VITALS — BP 161/88 | HR 67 | Ht 67.72 in | Wt 201.0 lb

## 2017-04-05 DIAGNOSIS — I129 Hypertensive chronic kidney disease with stage 1 through stage 4 chronic kidney disease, or unspecified chronic kidney disease: Secondary | ICD-10-CM | POA: Diagnosis not present

## 2017-04-05 DIAGNOSIS — Z1329 Encounter for screening for other suspected endocrine disorder: Secondary | ICD-10-CM | POA: Diagnosis not present

## 2017-04-05 DIAGNOSIS — N2581 Secondary hyperparathyroidism of renal origin: Secondary | ICD-10-CM

## 2017-04-05 DIAGNOSIS — E291 Testicular hypofunction: Secondary | ICD-10-CM

## 2017-04-05 DIAGNOSIS — N183 Chronic kidney disease, stage 3 unspecified: Secondary | ICD-10-CM

## 2017-04-05 DIAGNOSIS — Z0000 Encounter for general adult medical examination without abnormal findings: Secondary | ICD-10-CM

## 2017-04-05 DIAGNOSIS — N401 Enlarged prostate with lower urinary tract symptoms: Secondary | ICD-10-CM

## 2017-04-05 DIAGNOSIS — N5201 Erectile dysfunction due to arterial insufficiency: Secondary | ICD-10-CM

## 2017-04-05 DIAGNOSIS — I10 Essential (primary) hypertension: Secondary | ICD-10-CM | POA: Diagnosis not present

## 2017-04-05 DIAGNOSIS — R0989 Other specified symptoms and signs involving the circulatory and respiratory systems: Secondary | ICD-10-CM | POA: Diagnosis not present

## 2017-04-05 DIAGNOSIS — Z13220 Encounter for screening for lipoid disorders: Secondary | ICD-10-CM

## 2017-04-05 DIAGNOSIS — M1712 Unilateral primary osteoarthritis, left knee: Secondary | ICD-10-CM | POA: Insufficient documentation

## 2017-04-05 DIAGNOSIS — N138 Other obstructive and reflux uropathy: Secondary | ICD-10-CM | POA: Diagnosis not present

## 2017-04-05 DIAGNOSIS — Z0001 Encounter for general adult medical examination with abnormal findings: Secondary | ICD-10-CM | POA: Diagnosis not present

## 2017-04-05 DIAGNOSIS — Z7189 Other specified counseling: Secondary | ICD-10-CM

## 2017-04-05 DIAGNOSIS — Z79899 Other long term (current) drug therapy: Secondary | ICD-10-CM

## 2017-04-05 DIAGNOSIS — E232 Diabetes insipidus: Secondary | ICD-10-CM

## 2017-04-05 DIAGNOSIS — E1129 Type 2 diabetes mellitus with other diabetic kidney complication: Secondary | ICD-10-CM | POA: Diagnosis not present

## 2017-04-05 DIAGNOSIS — R972 Elevated prostate specific antigen [PSA]: Secondary | ICD-10-CM | POA: Diagnosis not present

## 2017-04-05 LAB — MICROSCOPIC EXAMINATION: RBC MICROSCOPIC, UA: NONE SEEN /HPF (ref 0–?)

## 2017-04-05 LAB — URINALYSIS, ROUTINE W REFLEX MICROSCOPIC
Bilirubin, UA: NEGATIVE
GLUCOSE, UA: NEGATIVE
Ketones, UA: NEGATIVE
Leukocytes, UA: NEGATIVE
NITRITE UA: NEGATIVE
RBC, UA: NEGATIVE
Specific Gravity, UA: 1.015 (ref 1.005–1.030)
Urobilinogen, Ur: 0.2 mg/dL (ref 0.2–1.0)
pH, UA: 5.5 (ref 5.0–7.5)

## 2017-04-05 MED ORDER — AMLODIPINE BESYLATE 10 MG PO TABS: 10.0000 mg | ORAL_TABLET | Freq: Every day | ORAL | 4 refills | Status: DC

## 2017-04-05 MED ORDER — AMILORIDE HCL 5 MG PO TABS: 5.0000 mg | ORAL_TABLET | Freq: Every day | ORAL | 4 refills | Status: DC

## 2017-04-05 NOTE — Assessment & Plan Note (Signed)
Followed by nephrology. 

## 2017-04-05 NOTE — Assessment & Plan Note (Signed)
Will make appointment for an ultrasound [facility]

## 2017-04-05 NOTE — Assessment & Plan Note (Signed)
Discussed elevated reading patient going back with nephrology next week will review with nephrology and assess treatment.

## 2017-04-05 NOTE — Addendum Note (Signed)
Addended by: Golden Pop A on: 04/05/2017 02:05 PM   Modules accepted: Orders

## 2017-04-05 NOTE — Assessment & Plan Note (Signed)
The current medical regimen is effective;  continue present plan and medications.  

## 2017-04-05 NOTE — Assessment & Plan Note (Signed)
Followed by urology.   

## 2017-04-05 NOTE — Progress Notes (Addendum)
BP (!) 161/88   Pulse 67   Ht 5' 7.72" (1.72 m)   Wt 201 lb (91.2 kg)   SpO2 99%   BMI 30.82 kg/m    Subjective:    Patient ID: Luis Coss., male    DOB: [birth year], 72 y.o.   MRN: [id number]  HPI: [name] is a 72 y.o. male  Chief Complaint  Patient presents with  . Annual Exam  All in all doing okay has been working with orthopedics for chronic knee pain with injections which may help some also has an apt coming up  with neurology for memory and tremor has appointment coming up later this month. Nerves are stable with lithium. Kidney functions also doing stable.   Relevant past medical, surgical, family and social history reviewed and updated as indicated. Interim medical history since our last visit reviewed. Allergies and medications reviewed and updated.  Review of Systems  Constitutional: Negative.   HENT: Negative.   Eyes: Negative.   Respiratory: Negative.   Cardiovascular: Negative.   Gastrointestinal: Negative.   Endocrine: Negative.   Genitourinary: Negative.   Musculoskeletal: Negative.   Skin: Negative.   Allergic/Immunologic: Negative.   Neurological: Negative.   Hematological: Negative.   Psychiatric/Behavioral: Negative.     Per HPI unless specifically indicated above     Objective:    BP (!) 161/88   Pulse 67   Ht 5' 7.72" (1.72 m)   Wt 201 lb (91.2 kg)   SpO2 99%   BMI 30.82 kg/m   Wt Readings from Last 3 Encounters:  04/05/17 201 lb (91.2 kg)  02/23/17 200 lb (90.7 kg)  12/15/16 204 lb (92.5 kg)    Physical Exam  Constitutional: He is oriented to person, place, and time. He appears well-developed and well-nourished.  HENT:  Head: Normocephalic and atraumatic.  Right Ear: External ear normal.  Left Ear: External ear normal.  Eyes: Conjunctivae and EOM are normal. Pupils are equal, round, and reactive to light.  Neck: Normal range of motion. Neck supple.  Carotid bruit the right appreciated  Cardiovascular:  Normal rate, regular rhythm, normal heart sounds and intact distal pulses.  Pulmonary/Chest: Effort normal and breath sounds normal.  Abdominal: Soft. Bowel sounds are normal. There is no splenomegaly or hepatomegaly.  Genitourinary:  Genitourinary Comments: followed by urology  Musculoskeletal: Normal range of motion.  Neurological: He is alert and oriented to person, place, and time. He has normal reflexes.  Skin: No rash noted. No erythema.  Psychiatric: He has a normal mood and affect. His behavior is normal. Judgment and thought content normal.    Results for orders placed or performed in visit on 12/06/16  PSA  Result Value Ref Range   Prostate Specific Ag, Serum 5.6 (H) 0.0 - 4.0 ng/mL      Assessment & Plan:   Problem List Items Addressed This Visit      Cardiovascular and Mediastinum   Hypertension - Primary (Chronic)    Discussed elevated reading patient going back with nephrology next week will review with nephrology and assess treatment.      Relevant Medications   aMILoride (MIDAMOR) 5 MG tablet   amLODipine (NORVASC) 10 MG tablet   Other Relevant Orders   Comprehensive metabolic panel   CBC with Differential/Platelet   Urinalysis, Routine w reflex microscopic   Erectile dysfunction due to arterial insufficiency   Relevant Medications   aMILoride (MIDAMOR) 5 MG tablet   amLODipine (NORVASC) 10 MG tablet  Other Relevant Orders   PSA     Endocrine   Testicular hypofunction   Relevant Orders   PSA   Diabetes insipidus ([place])    Followed by nephrology      Secondary hyperparathyroidism of renal origin ([place])    Followed by nephrology        Genitourinary   CKD (chronic kidney disease) stage 3, GFR 30-59 ml/min (HCC)    Followed by nephrology      Relevant Orders   Comprehensive metabolic panel   CBC with Differential/Platelet   Urinalysis, Routine w reflex microscopic   Benign prostatic hyperplasia with urinary obstruction    Followed by  urology      Relevant Orders   PSA     Other   Elevated prostate specific antigen (PSA)   Relevant Orders   PSA   Lithium use   Relevant Orders   Comprehensive metabolic panel   CBC with Differential/Platelet   Urinalysis, Routine w reflex microscopic   Advanced care planning/counseling discussion    The current medical regimen is effective;  continue present plan and medications.        Other Visit Diagnoses    Thyroid disorder screen       Relevant Orders   TSH   Screening cholesterol level       Relevant Orders   Lipid panel   PE (physical exam), annual           Follow up plan: Return in about 6 months (around 10/03/2017) for BMP,  Lipids, ALT, AST, uric acid.

## 2017-04-06 LAB — CBC WITH DIFFERENTIAL/PLATELET
BASOS ABS: 0 10*3/uL (ref 0.0–0.2)
BASOS: 0 %
EOS (ABSOLUTE): 0 10*3/uL (ref 0.0–0.4)
Eos: 0 %
Hematocrit: 36.4 % — ABNORMAL LOW (ref 37.5–51.0)
Hemoglobin: 12.8 g/dL — ABNORMAL LOW (ref 13.0–17.7)
IMMATURE GRANULOCYTES: 0 %
Immature Grans (Abs): 0 10*3/uL (ref 0.0–0.1)
Lymphocytes Absolute: 0.7 10*3/uL (ref 0.7–3.1)
Lymphs: 7 %
MCH: 34.5 pg — ABNORMAL HIGH (ref 26.6–33.0)
MCHC: 35.2 g/dL (ref 31.5–35.7)
MCV: 98 fL — AB (ref 79–97)
Monocytes Absolute: 0.4 10*3/uL (ref 0.1–0.9)
Monocytes: 4 %
NEUTROS PCT: 89 %
Neutrophils Absolute: 9 10*3/uL — ABNORMAL HIGH (ref 1.4–7.0)
PLATELETS: 198 10*3/uL (ref 150–379)
RBC: 3.71 x10E6/uL — ABNORMAL LOW (ref 4.14–5.80)
RDW: 13.9 % (ref 12.3–15.4)
WBC: 10.1 10*3/uL (ref 3.4–10.8)

## 2017-04-06 LAB — COMPREHENSIVE METABOLIC PANEL
A/G RATIO: 1.6 (ref 1.2–2.2)
ALT: 15 IU/L (ref 0–44)
AST: 14 IU/L (ref 0–40)
Albumin: 4.3 g/dL (ref 3.5–4.8)
Alkaline Phosphatase: 74 IU/L (ref 39–117)
BILIRUBIN TOTAL: 0.3 mg/dL (ref 0.0–1.2)
BUN/Creatinine Ratio: 17 (ref 10–24)
BUN: 46 mg/dL — ABNORMAL HIGH (ref 8–27)
CALCIUM: 9.9 mg/dL (ref 8.6–10.2)
CHLORIDE: 113 mmol/L — AB (ref 96–106)
CO2: 18 mmol/L — ABNORMAL LOW (ref 20–29)
Creatinine, Ser: 2.77 mg/dL — ABNORMAL HIGH (ref 0.76–1.27)
GFR calc Af Amer: 25 mL/min/{1.73_m2} — ABNORMAL LOW (ref >59)
GFR, EST NON AFRICAN AMERICAN: 22 mL/min/{1.73_m2} — AB (ref >59)
GLOBULIN, TOTAL: 2.7 g/dL (ref 1.5–4.5)
Glucose: 112 mg/dL — ABNORMAL HIGH (ref 65–99)
POTASSIUM: 4.8 mmol/L (ref 3.5–5.2)
SODIUM: 148 mmol/L — AB (ref 134–144)
Total Protein: 7 g/dL (ref 6.0–8.5)

## 2017-04-06 LAB — LIPID PANEL
Chol/HDL Ratio: 4.7 ratio (ref 0.0–5.0)
Cholesterol, Total: 202 mg/dL — ABNORMAL HIGH (ref 100–199)
HDL: 43 mg/dL (ref >39)
LDL Calculated: 111 mg/dL — ABNORMAL HIGH (ref 0–99)
Triglycerides: 241 mg/dL — ABNORMAL HIGH (ref 0–149)
VLDL Cholesterol Cal: 48 mg/dL — ABNORMAL HIGH (ref 5–40)

## 2017-04-06 LAB — PSA: PROSTATE SPECIFIC AG, SERUM: 7.1 ng/mL — AB (ref 0.0–4.0)

## 2017-04-06 LAB — TSH: TSH: 0.213 u[IU]/mL — AB (ref 0.450–4.500)

## 2017-04-07 DIAGNOSIS — R946 Abnormal results of thyroid function studies: Secondary | ICD-10-CM | POA: Diagnosis not present

## 2017-04-11 ENCOUNTER — Encounter: Payer: Self-pay | Admitting: Psychiatry

## 2017-04-11 ENCOUNTER — Other Ambulatory Visit: Payer: Self-pay

## 2017-04-11 ENCOUNTER — Ambulatory Visit: Payer: Medicare Other | Admitting: Psychiatry

## 2017-04-11 ENCOUNTER — Ambulatory Visit: Payer: Self-pay | Admitting: Psychiatry

## 2017-04-11 VITALS — BP 165/87 | HR 67 | Temp 97.3°F | Wt 201.8 lb

## 2017-04-11 DIAGNOSIS — F3160 Bipolar disorder, current episode mixed, unspecified: Secondary | ICD-10-CM | POA: Diagnosis not present

## 2017-04-11 MED ORDER — LITHIUM CARBONATE ER 300 MG PO TBCR: 300.0000 mg | EXTENDED_RELEASE_TABLET | Freq: Every day | ORAL | 1 refills | Status: DC

## 2017-04-11 MED ORDER — QUETIAPINE FUMARATE 25 MG PO TABS: 25.0000 mg | ORAL_TABLET | Freq: Every day | ORAL | 0 refills | Status: DC

## 2017-04-11 NOTE — Progress Notes (Signed)
Psychiatric MD Progress Note   Patient Identification: Luis Miller. MRN:  [id number] Date of Evaluation:  04/11/2017 Referral Source: Golden Pop, M.D Chief Complaint:   Chief Complaint    Medication Refill; Follow-up     Visit Diagnosis:    ICD-10-CM   1. Bipolar I disorder, most recent episode mixed ([place]) F31.60     History of Present Illness:    Patient is a 72 year old divorced male with history of bipolar disorder presented for follow up. He stated that he has been diagnosed with several medical problems as he was seen by his primary care physician for annual  physical exam and they have noticed that he has worsening thyroid issues and has tremors. He reported that they have referred him to neurology for evaluation. He reported that he is concerned about his lithium as he was advised by his urologist Dr. Holley Raring not to have his medication changed. Patient reported that he also has relationship issues with his girlfriend who has become depressed after the death of her mother in 2023/03/28. She does not want to keep any relationship. He reported that he is also not interested in the relationship and is still going to the dance programs. We discussed about having his medications adjusted and he agreed with the plan. He reported that he is feeling well and is sleeping good at night. He does not have any depressive symptoms at this time. He denied having any perceptual disturbances. He denied having any suicidal homicidal ideations or plans.    Associated Signs/Symptoms: Depression Symptoms:  fatigue, loss of energy/fatigue, (Hypo) Manic Symptoms:  denied Anxiety Symptoms:  Excessive Worry, Psychotic Symptoms:  none PTSD Symptoms: Negative NA  Past Psychiatric History:  Long history of bipolar disorder. He reported that he has seen Dr. Vallarie Mare for many years in the past. ECT Treatment - several  Psychiatric Admission x 4, [facility], [facility], [place], [place] No h/o Suicide  attemtps.   Previous Psychotropic Medications:  Lithium Mellaril Seroquel Does not remember the names.   Allergies- Remeron Olanzapine Strattera   Substance Abuse History in the last 12 months:  No.  Consequences of Substance Abuse: Negative NA  Past Medical History:  Past Medical History:  Diagnosis Date  . Anemia   . Apnea, sleep   . Bipolar affective ([place])   . Cancer ([place]) malignant melanoma  . Crohn's disease ([place])   . Depression   . Diabetes insipidus ([place])   . Erectile dysfunction   . Hyperlipidemia   . Hypertension   . IBS (irritable bowel syndrome)   . Impotence   . Internal hemorrhoids   . 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 Paraphimosis   . Peyronie disease   . Secondary hyperparathyroidism of renal origin ([place])   . Skin cancer   . Testicular hypofunction   . Urinary frequency   . Urinary hesitancy     Past Surgical History:  Procedure Laterality Date  . arm fracture    . BONE MARROW BIOPSY    . COLONOSCOPY WITH PROPOFOL N/A 12/23/2014   Procedure: COLONOSCOPY WITH PROPOFOL;  Surgeon: Manya Silvas, MD;  Location: [facility] ENDOSCOPY;  Service: Endoscopy;  Laterality: N/A;  . MELANOMA EXCISION    . PENILE PROSTHESIS IMPLANT    . PENILE PROSTHESIS IMPLANT N/A 08/25/2015   Procedure: REPLACEMENT OF INFLATABLE PENILE PROSTHESIS COMPONENTS;  Surgeon: Cleon Gustin, MD;  Location: [facility] ORS;  Service: Urology;  Laterality: N/A;  . TONSILLECTOMY    . VASECTOMY  Family Psychiatric History:  He reported bipolar disorder in his sister and nephew.  Family History:  Family History  Problem Relation Age of Onset  . Depression Sister   . Bipolar disorder Other   . Prostate cancer Neg Hx   . Kidney cancer Neg Hx   . Bladder Cancer Neg Hx     Social History:   Social History   Socioeconomic History  . Marital status: Divorced    Spouse name: None  . Number of children: 2  . Years of education: None  . Highest education level: High  school graduate  Social Needs  . Financial resource strain: Not hard at all  . Food insecurity - worry: Never true  . Food insecurity - inability: Never true  . Transportation needs - medical: No  . Transportation needs - non-medical: No  Occupational History  . Occupation: retired  Tobacco Use  . Smoking status: Former Smoker    Types: Cigarettes    Last attempt to quit: 09/08/1972    Years since quitting: 44.6  . Smokeless tobacco: Never Used  Substance and Sexual Activity  . Alcohol use: No    Alcohol/week: 0.0 oz  . Drug use: No  . Sexual activity: No  Other Topics Concern  . None  Social History Narrative  . None    Additional Social History:  Divorced- x 2 Lives with GF, she is 68 Retired- from [employer].    Allergies:   Allergies  Allergen Reactions  . Mirtazapine Rash  . Olanzapine Rash  . Atomoxetine Other (See Comments)    Urinary sx  . Strattera [Atomoxetine Hcl] Other (See Comments)    Urinary sx    Metabolic Disorder Labs: No results found for: HGBA1C, MPG No results found for: PROLACTIN Lab Results  Component Value Date   CHOL 202 (H) 04/05/2017   TRIG 241 (H) 04/05/2017   HDL 43 04/05/2017   CHOLHDL 4.7 04/05/2017   LDLCALC 111 (H) 04/05/2017   LDLCALC 95 06/07/2016     Current Medications: Current Outpatient Medications  Medication Sig Dispense Refill  . allopurinol (ZYLOPRIM) 100 MG tablet Take 100 mg by mouth daily.     Marland Kitchen aMILoride (MIDAMOR) 5 MG tablet Take 1 tablet (5 mg total) by mouth daily. 90 tablet 4  . amLODipine (NORVASC) 10 MG tablet Take 1 tablet (10 mg total) by mouth daily. 90 tablet 4  . benazepril (LOTENSIN) 40 MG tablet Take 40 mg by mouth daily.     . Colchicine 0.6 MG CAPS Take 0.3 mg by mouth every morning. Take 2 today 30 capsule 3  . cyproheptadine (PERIACTIN) 4 MG tablet Take one tablet three hours prior to intercourse 90 tablet 3  . fluticasone (FLONASE) 50 MCG/ACT nasal spray Place into both nostrils daily.    Marland Kitchen  FOLIC ACID PO Take by mouth.    . hydrALAZINE (APRESOLINE) 100 MG tablet Take 100 mg by mouth 3 (three) times daily.    Marland Kitchen levocetirizine (XYZAL) 5 MG tablet Take 1 tablet (5 mg total) by mouth every evening. 30 tablet 3  . lithium carbonate (ESKALITH) 450 MG CR tablet Take 1 tablet (450 mg total) daily by mouth. 90 tablet 1  . Multiple Vitamin (MULTIVITAMIN) tablet Take 1 tablet by mouth daily.    . QUEtiapine (SEROQUEL) 25 MG tablet Take 1 tablet (25 mg total) at bedtime by mouth. 90 tablet 0  . VITAMIN D, CHOLECALCIFEROL, PO Take 1 tablet by mouth daily.  No current facility-administered medications for this visit.     Neurologic: Headache: No Seizure: No Paresthesias:No  Musculoskeletal: Strength & Muscle Tone: within normal limits Gait & Station: normal Patient leans: N/A  Psychiatric Specialty Exam: ROS  Blood pressure (!) 165/87, pulse 67, temperature (!) 97.3 F (36.3 C), temperature source Oral, weight 201 lb 12.8 oz (91.5 kg).Body mass index is 30.94 kg/m.  General Appearance: Casual  Eye Contact:  Fair  Speech:  Clear and Coherent  Volume:  Normal  Mood:  Anxious  Affect:  Appropriate and Congruent  Thought Process:  Coherent  Orientation:  Full (Time, Place, and Person)  Thought Content:  Logical  Suicidal Thoughts:  No  Homicidal Thoughts:  No  Memory:  Immediate;   Fair Recent;   Fair Remote;   Fair  Judgement:  Fair  Insight:  Fair  Psychomotor Activity:  Psychomotor Retardation  Concentration:  Concentration: Fair and Attention Span: Fair  Recall:  [employer] of Knowledge:Fair  Language: Fair  Akathisia:  No  Handed:  Right  AIMS (if indicated):    Assets:  Armed forces logistics/support/administrative officer Physical Health Social Support  ADL's:  Intact  Cognition: WNL  Sleep:  good    Treatment Plan Summary: Medication management    I will decrease lithium 300 mg by mouth daily at bedtime. Continue Seroquel 25 mg by mouth daily at bedtime  Advised patient if he  notices worsening of his symptoms he should come back for an earlier appointment otherwise he'll follow-up in 2 weeks.         More than 50% of the time spent in psychoeducation, counseling and coordination of care.    This note was generated in part or whole with voice recognition software. Voice regonition is usually quite accurate but there are transcription errors that can and very often do occur. I apologize for any typographical errors that were not detected and corrected.    Rainey Pines, MD 1/21/20192:47 PM

## 2017-04-12 DIAGNOSIS — R251 Tremor, unspecified: Secondary | ICD-10-CM | POA: Diagnosis not present

## 2017-04-12 DIAGNOSIS — Z79899 Other long term (current) drug therapy: Secondary | ICD-10-CM | POA: Diagnosis not present

## 2017-04-12 DIAGNOSIS — R946 Abnormal results of thyroid function studies: Secondary | ICD-10-CM | POA: Diagnosis not present

## 2017-04-18 DIAGNOSIS — E232 Diabetes insipidus: Secondary | ICD-10-CM | POA: Diagnosis not present

## 2017-04-18 DIAGNOSIS — N2581 Secondary hyperparathyroidism of renal origin: Secondary | ICD-10-CM | POA: Diagnosis not present

## 2017-04-18 DIAGNOSIS — I10 Essential (primary) hypertension: Secondary | ICD-10-CM | POA: Diagnosis not present

## 2017-04-18 DIAGNOSIS — N184 Chronic kidney disease, stage 4 (severe): Secondary | ICD-10-CM | POA: Diagnosis not present

## 2017-04-21 DIAGNOSIS — R0989 Other specified symptoms and signs involving the circulatory and respiratory systems: Secondary | ICD-10-CM | POA: Diagnosis not present

## 2017-04-21 DIAGNOSIS — R413 Other amnesia: Secondary | ICD-10-CM | POA: Diagnosis not present

## 2017-04-21 DIAGNOSIS — G251 Drug-induced tremor: Secondary | ICD-10-CM | POA: Diagnosis not present

## 2017-04-25 ENCOUNTER — Other Ambulatory Visit: Payer: Self-pay

## 2017-04-25 ENCOUNTER — Ambulatory Visit: Payer: Medicare Other | Admitting: Psychiatry

## 2017-04-25 ENCOUNTER — Encounter: Payer: Self-pay | Admitting: Psychiatry

## 2017-04-25 ENCOUNTER — Other Ambulatory Visit: Payer: Self-pay | Admitting: Neurology

## 2017-04-25 VITALS — BP 150/79 | HR 79 | Temp 97.5°F | Wt 201.0 lb

## 2017-04-25 DIAGNOSIS — F3160 Bipolar disorder, current episode mixed, unspecified: Secondary | ICD-10-CM | POA: Diagnosis not present

## 2017-04-25 DIAGNOSIS — R413 Other amnesia: Secondary | ICD-10-CM

## 2017-04-25 DIAGNOSIS — G251 Drug-induced tremor: Secondary | ICD-10-CM

## 2017-04-25 MED ORDER — MELATONIN 3 MG PO TABS: 3.0000 mg | ORAL_TABLET | Freq: Every day | ORAL | 1 refills | Status: DC

## 2017-04-25 NOTE — Progress Notes (Signed)
Psychiatric MD Progress Note   Patient Identification: Luis Miller. MRN:  [id number] Date of Evaluation:  04/25/2017 Referral Source: Golden Pop, M.D Chief Complaint:   Chief Complaint    Follow-up; Medication Refill     Visit Diagnosis:    ICD-10-CM   1. Bipolar I disorder, most recent episode mixed ([place]) F31.60     History of Present Illness:    Patient is a 72 year old divorced male with history of bipolar disorder presented for follow up. He stated that he has noticed some improvement in his symptoms since the dose of lithium was decreased to 300 mg. He reported that his tremors are improving. He reported that he has some problems with his sleep and he wakes up early in the morning. We discussed about his medications. He reported that he has an upcoming appointment with the neurologist in the next few months. They want to do his MRI. He reported that they also want to do an ultrasound in his carotid arteries. Patient reported that he has noticed some memory issues but it has been a long-term problem for him. He does not have any mood changes since the lithium dose was decreased. He wants medications to help with his sleep at this time. He denied having any suicidal homicidal ideations or plans. He remains receptive to his medication changes at this time.       Associated Signs/Symptoms: Depression Symptoms:  fatigue, loss of energy/fatigue, (Hypo) Manic Symptoms:  denied Anxiety Symptoms:  Excessive Worry, Psychotic Symptoms:  none PTSD Symptoms: Negative NA  Past Psychiatric History:  Long history of bipolar disorder. He reported that he has seen Dr. Vallarie Mare for many years in the past. ECT Treatment - several  Psychiatric Admission x 4, [facility], [facility], [place], [place] No h/o Suicide attemtps.   Previous Psychotropic Medications:  Lithium Mellaril Seroquel Does not remember the names.   Allergies- Remeron Olanzapine Strattera   Substance Abuse History in  the last 12 months:  No.  Consequences of Substance Abuse: Negative NA  Past Medical History:  Past Medical History:  Diagnosis Date  . Anemia   . Apnea, sleep   . Bipolar affective ([place])   . Cancer ([place]) malignant melanoma  . Crohn's disease ([place])   . Depression   . Diabetes insipidus ([place])   . Erectile dysfunction   . Hyperlipidemia   . Hypertension   . IBS (irritable bowel syndrome)   . Impotence   . Internal hemorrhoids   . 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[place])   . Paraphimosis   . Peyronie disease   . Secondary hyperparathyroidism of renal origin ([place])   . Skin cancer   . Testicular hypofunction   . Urinary frequency   . Urinary hesitancy     Past Surgical History:  Procedure Laterality Date  . arm fracture    . BONE MARROW BIOPSY    . COLONOSCOPY WITH PROPOFOL N/A 12/23/2014   Procedure: COLONOSCOPY WITH PROPOFOL;  Surgeon: Manya Silvas, MD;  Location: [facility] ENDOSCOPY;  Service: Endoscopy;  Laterality: N/A;  . MELANOMA EXCISION    . PENILE PROSTHESIS IMPLANT    . PENILE PROSTHESIS IMPLANT N/A 08/25/2015   Procedure: REPLACEMENT OF INFLATABLE PENILE PROSTHESIS COMPONENTS;  Surgeon: Cleon Gustin, MD;  Location: [facility] ORS;  Service: Urology;  Laterality: N/A;  . TONSILLECTOMY    . VASECTOMY      Family Psychiatric History:  He reported bipolar disorder in his sister and nephew.  Family History:  Family History  Problem Relation Age of Onset  . Depression Sister   . Bipolar disorder Other   . Prostate cancer Neg Hx   . Kidney cancer Neg Hx   . Bladder Cancer Neg Hx     Social History:   Social History   Socioeconomic History  . Marital status: Divorced    Spouse name: None  . Number of children: 2  . Years of education: None  . Highest education level: High school graduate  Social Needs  . Financial resource strain: Not hard at all  . Food insecurity - worry: Never true  . Food insecurity - inability: Never true  . Transportation needs -  medical: No  . Transportation needs - non-medical: No  Occupational History  . Occupation: retired  Tobacco Use  . Smoking status: Former Smoker    Types: Cigarettes    Last attempt to quit: 09/08/1972    Years since quitting: 44.6  . Smokeless tobacco: Never Used  Substance and Sexual Activity  . Alcohol use: No    Alcohol/week: 0.0 oz  . Drug use: No  . Sexual activity: No  Other Topics Concern  . None  Social History Narrative  . None    Additional Social History:  Divorced- x 2 Lives with GF, she is 81 Retired- from [employer].    Allergies:   Allergies  Allergen Reactions  . Mirtazapine Rash  . Olanzapine Rash  . Atomoxetine Other (See Comments)    Urinary sx  . Strattera [Atomoxetine Hcl] Other (See Comments)    Urinary sx    Metabolic Disorder Labs: No results found for: HGBA1C, MPG No results found for: PROLACTIN Lab Results  Component Value Date   CHOL 202 (H) 04/05/2017   TRIG 241 (H) 04/05/2017   HDL 43 04/05/2017   CHOLHDL 4.7 04/05/2017   LDLCALC 111 (H) 04/05/2017   LDLCALC 95 06/07/2016     Current Medications: Current Outpatient Medications  Medication Sig Dispense Refill  . allopurinol (ZYLOPRIM) 100 MG tablet Take 100 mg by mouth daily.     Marland Kitchen aMILoride (MIDAMOR) 5 MG tablet Take 1 tablet (5 mg total) by mouth daily. 90 tablet 4  . amLODipine (NORVASC) 10 MG tablet Take 1 tablet (10 mg total) by mouth daily. 90 tablet 4  . benazepril (LOTENSIN) 40 MG tablet Take 40 mg by mouth daily.     . Colchicine 0.6 MG CAPS Take 0.3 mg by mouth every morning. Take 2 today 30 capsule 3  . cyproheptadine (PERIACTIN) 4 MG tablet Take one tablet three hours prior to intercourse 90 tablet 3  . fluticasone (FLONASE) 50 MCG/ACT nasal spray Place into both nostrils daily.    Marland Kitchen FOLIC ACID PO Take by mouth.    . hydrALAZINE (APRESOLINE) 100 MG tablet Take 100 mg by mouth 3 (three) times daily.    Marland Kitchen levocetirizine (XYZAL) 5 MG tablet Take 1 tablet (5 mg total) by  mouth every evening. 30 tablet 3  . lithium carbonate (LITHOBID) 300 MG CR tablet Take 1 tablet (300 mg total) by mouth at bedtime. 30 tablet 1  . Multiple Vitamin (MULTIVITAMIN) tablet Take 1 tablet by mouth daily.    . QUEtiapine (SEROQUEL) 25 MG tablet Take 1 tablet (25 mg total) by mouth at bedtime. 90 tablet 0  . VITAMIN D, CHOLECALCIFEROL, PO Take 1 tablet by mouth daily.     . Melatonin 3 MG TABS Take 1 tablet (3 mg total) by mouth at bedtime. 30 tablet 1  No current facility-administered medications for this visit.     Neurologic: Headache: No Seizure: No Paresthesias:No  Musculoskeletal: Strength & Muscle Tone: within normal limits Gait & Station: normal Patient leans: N/A  Psychiatric Specialty Exam: ROS  Blood pressure (!) 150/79, pulse 79, temperature (!) 97.5 F (36.4 C), temperature source Oral, weight 201 lb (91.2 kg).Body mass index is 30.82 kg/m.  General Appearance: Casual  Eye Contact:  Fair  Speech:  Clear and Coherent  Volume:  Normal  Mood:  Anxious  Affect:  Appropriate and Congruent  Thought Process:  Coherent  Orientation:  Full (Time, Place, and Person)  Thought Content:  Logical  Suicidal Thoughts:  No  Homicidal Thoughts:  No  Memory:  Immediate;   Fair Recent;   Fair Remote;   Fair  Judgement:  Fair  Insight:  Fair  Psychomotor Activity:  Psychomotor Retardation  Concentration:  Concentration: Fair and Attention Span: Fair  Recall:  [employer] of Knowledge:Fair  Language: Fair  Akathisia:  No  Handed:  Right  AIMS (if indicated):    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 Continue Seroquel 25 mg by mouth daily at bedtime I will add melatonin 3 mg at bedtime when necessary for insomnia.  Advised patient if he notices worsening of his symptoms he should come  back for an earlier appointment otherwise he'll follow-up in 4 weeks.         More than 50% of the time spent in psychoeducation, counseling and coordination of care.    This note was generated in part or whole with voice recognition software. Voice regonition is usually quite accurate but there are transcription errors that can and very often do occur. I apologize for any typographical errors that were not detected and corrected.    Rainey Pines, MD 2/4/201911:47 AM

## 2017-05-02 DIAGNOSIS — N2581 Secondary hyperparathyroidism of renal origin: Secondary | ICD-10-CM | POA: Diagnosis not present

## 2017-05-02 DIAGNOSIS — I10 Essential (primary) hypertension: Secondary | ICD-10-CM | POA: Diagnosis not present

## 2017-05-02 DIAGNOSIS — N183 Chronic kidney disease, stage 3 (moderate): Secondary | ICD-10-CM | POA: Diagnosis not present

## 2017-05-02 DIAGNOSIS — E232 Diabetes insipidus: Secondary | ICD-10-CM | POA: Diagnosis not present

## 2017-05-03 ENCOUNTER — Ambulatory Visit
Admission: RE | Admit: 2017-05-03 | Discharge: 2017-05-03 | Disposition: A | Discharge status: Home / Self Care | Payer: Medicare Other | Source: Ambulatory Visit | Attending: Neurology | Admitting: Neurology

## 2017-05-03 DIAGNOSIS — J320 Chronic maxillary sinusitis: Secondary | ICD-10-CM | POA: Diagnosis not present

## 2017-05-03 DIAGNOSIS — G251 Drug-induced tremor: Secondary | ICD-10-CM | POA: Diagnosis not present

## 2017-05-03 DIAGNOSIS — R413 Other amnesia: Secondary | ICD-10-CM

## 2017-05-03 DIAGNOSIS — G252 Other specified forms of tremor: Secondary | ICD-10-CM | POA: Diagnosis not present

## 2017-05-05 DIAGNOSIS — I6521 Occlusion and stenosis of right carotid artery: Secondary | ICD-10-CM | POA: Diagnosis not present

## 2017-05-05 DIAGNOSIS — R0989 Other specified symptoms and signs involving the circulatory and respiratory systems: Secondary | ICD-10-CM | POA: Diagnosis not present

## 2017-05-08 DIAGNOSIS — G251 Drug-induced tremor: Secondary | ICD-10-CM | POA: Insufficient documentation

## 2017-05-09 ENCOUNTER — Telehealth: Payer: Self-pay

## 2017-05-09 NOTE — Telephone Encounter (Signed)
pt called states that the melatonin is not working he wakes up and cant get back to sleep.  Pt was last seen on  04-25-17 next appt 05-23-17

## 2017-05-10 NOTE — Telephone Encounter (Signed)
pt is calling again pt states he not able to sleep. do you recommend anything else that he can try.

## 2017-05-23 ENCOUNTER — Encounter: Payer: Self-pay | Admitting: Psychiatry

## 2017-05-23 ENCOUNTER — Ambulatory Visit: Payer: Medicare Other | Admitting: Psychiatry

## 2017-05-23 VITALS — BP 144/74 | HR 76 | Ht 66.25 in | Wt 201.0 lb

## 2017-05-23 DIAGNOSIS — F3160 Bipolar disorder, current episode mixed, unspecified: Secondary | ICD-10-CM | POA: Diagnosis not present

## 2017-05-23 MED ORDER — LITHIUM CARBONATE ER 300 MG PO TBCR: 300.0000 mg | EXTENDED_RELEASE_TABLET | Freq: Every day | ORAL | 1 refills | Status: DC

## 2017-05-23 MED ORDER — QUETIAPINE FUMARATE 25 MG PO TABS: 25.0000 mg | ORAL_TABLET | Freq: Every day | ORAL | 1 refills | Status: DC

## 2017-05-23 NOTE — Progress Notes (Signed)
Psychiatric MD Progress Note   Patient Identification: Luis Miller. MRN:  [id number] Date of Evaluation:  05/23/2017 Referral Source: Golden Pop, M.D Chief Complaint:    Visit Diagnosis:    ICD-10-CM   1. Bipolar I disorder, most recent episode mixed ([place]) F31.60     History of Present Illness:    Patient is a 72 year old divorced male with history of bipolar disorder presented for follow up. He stated that he had his MRI done and is still awaiting results. He reported that he is also waiting for his neurology appointment. Patient reported that he continues to have mild tremors occasionally. We discussed about his medications in detail. He reported that he is compliant with his medication and did not have any side effects from the lithium. Patient currently denied having any suicidal ideations or plans. He reported that he sleeps well at night. He is taking Seroquel at night. He denied having any suicidal homicidal ideations or plans. He remains receptive to his medications at this time.  I  Read  the MRI results to the patient and they are within normal limits. He was appreciative of the same.He does not have any mood changes since the lithium dose was decreased.       Associated Signs/Symptoms: Depression Symptoms:  fatigue, loss of energy/fatigue, (Hypo) Manic Symptoms:  denied Anxiety Symptoms:  Excessive Worry, Psychotic Symptoms:  none PTSD Symptoms: Negative NA  Past Psychiatric History:  Long history of bipolar disorder. He reported that he has seen Dr. Vallarie Mare for many years in the past. ECT Treatment - several  Psychiatric Admission x 4, [facility], [facility], [place], [place] No h/o Suicide attemtps.   Previous Psychotropic Medications:  Lithium Mellaril Seroquel Does not remember the names.   Allergies- Remeron Olanzapine Strattera   Substance Abuse History in the last 12 months:  No.  Consequences of Substance Abuse: Negative NA  Past Medical History:   Past Medical History:  Diagnosis Date  . Anemia   . Apnea, sleep   . Bipolar affective ([place])   . Cancer ([place]) malignant melanoma  . Crohn's disease ([place])   . Depression   . Diabetes insipidus ([place])   . Erectile dysfunction   . Hyperlipidemia   . Hypertension   . IBS (irritable bowel syndrome)   . Impotence   . Internal hemorrhoids   . 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[place])   . Paraphimosis   . Peyronie disease   . Secondary hyperparathyroidism of renal origin ([place])   . Skin cancer   . Testicular hypofunction   . Urinary frequency   . Urinary hesitancy     Past Surgical History:  Procedure Laterality Date  . arm fracture    . BONE MARROW BIOPSY    . COLONOSCOPY WITH PROPOFOL N/A 12/23/2014   Procedure: COLONOSCOPY WITH PROPOFOL;  Surgeon: Manya Silvas, MD;  Location: [facility] ENDOSCOPY;  Service: Endoscopy;  Laterality: N/A;  . MELANOMA EXCISION    . PENILE PROSTHESIS IMPLANT    . PENILE PROSTHESIS IMPLANT N/A 08/25/2015   Procedure: REPLACEMENT OF INFLATABLE PENILE PROSTHESIS COMPONENTS;  Surgeon: Cleon Gustin, MD;  Location: [facility] ORS;  Service: Urology;  Laterality: N/A;  . TONSILLECTOMY    . VASECTOMY      Family Psychiatric History:  He reported bipolar disorder in his sister and nephew.  Family History:  Family History  Problem Relation Age of Onset  . Depression Sister   . Bipolar disorder Other   . Prostate cancer Neg Hx   .  Kidney cancer Neg Hx   . Bladder Cancer Neg Hx     Social History:   Social History   Socioeconomic History  . Marital status: Divorced    Spouse name: None  . Number of children: 2  . Years of education: None  . Highest education level: High school graduate  Social Needs  . Financial resource strain: Not hard at all  . Food insecurity - worry: Never true  . Food insecurity - inability: Never true  . Transportation needs - medical: No  . Transportation needs - non-medical: No  Occupational History  . Occupation: retired   Tobacco Use  . Smoking status: Former Smoker    Types: Cigarettes    Last attempt to quit: 09/08/1972    Years since quitting: 44.7  . Smokeless tobacco: Never Used  Substance and Sexual Activity  . Alcohol use: Yes    Alcohol/week: 0.0 oz    Comment: Occasional glass of wine  . Drug use: No  . Sexual activity: Yes    Partners: Female    Birth control/protection: None  Other Topics Concern  . None  Social History Narrative  . None    Additional Social History:  Divorced- x 2 Lives with GF, she is 26 Retired- from [employer].    Allergies:   Allergies  Allergen Reactions  . Mirtazapine Rash  . Olanzapine Rash  . Atomoxetine Other (See Comments)    Urinary sx  . Strattera [Atomoxetine Hcl] Other (See Comments)    Urinary sx    Metabolic Disorder Labs: No results found for: HGBA1C, MPG No results found for: PROLACTIN Lab Results  Component Value Date   CHOL 202 (H) 04/05/2017   TRIG 241 (H) 04/05/2017   HDL 43 04/05/2017   CHOLHDL 4.7 04/05/2017   LDLCALC 111 (H) 04/05/2017   LDLCALC 95 06/07/2016     Current Medications: Current Outpatient Medications  Medication Sig Dispense Refill  . allopurinol (ZYLOPRIM) 100 MG tablet Take 100 mg by mouth daily.     Marland Kitchen aMILoride (MIDAMOR) 5 MG tablet Take 1 tablet (5 mg total) by mouth daily. 90 tablet 4  . amLODipine (NORVASC) 10 MG tablet Take 1 tablet (10 mg total) by mouth daily. 90 tablet 4  . benazepril (LOTENSIN) 40 MG tablet Take 40 mg by mouth daily.     . Colchicine 0.6 MG CAPS Take 0.3 mg by mouth every morning. Take 2 today 30 capsule 3  . cyproheptadine (PERIACTIN) 4 MG tablet Take one tablet three hours prior to intercourse 90 tablet 3  . fluticasone (FLONASE) 50 MCG/ACT nasal spray Place into both nostrils daily.    Marland Kitchen FOLIC ACID PO Take by mouth.    . hydrALAZINE (APRESOLINE) 100 MG tablet Take 100 mg by mouth 3 (three) times daily.    Marland Kitchen levocetirizine (XYZAL) 5 MG tablet Take 1 tablet (5 mg total) by mouth  every evening. 30 tablet 3  . lithium carbonate (LITHOBID) 300 MG CR tablet Take 1 tablet (300 mg total) by mouth at bedtime. 90 tablet 1  . Melatonin 3 MG TABS Take 1 tablet (3 mg total) by mouth at bedtime. 30 tablet 1  . Multiple Vitamin (MULTIVITAMIN) tablet Take 1 tablet by mouth daily.    . QUEtiapine (SEROQUEL) 25 MG tablet Take 1 tablet (25 mg total) by mouth at bedtime. 90 tablet 1  . VITAMIN D, CHOLECALCIFEROL, PO Take 1 tablet by mouth daily.      No current facility-administered medications for this  visit.     Neurologic: Headache: No Seizure: No Paresthesias:No  Musculoskeletal: Strength & Muscle Tone: within normal limits Gait & Station: normal Patient leans: N/A  Psychiatric Specialty Exam: ROS  Blood pressure (!) 144/74, pulse 76, height 5' 6.25" (1.683 m), weight 201 lb (91.2 kg), SpO2 96 %.Body mass index is 32.2 kg/m.  General Appearance: Casual  Eye Contact:  Fair  Speech:  Clear and Coherent  Volume:  Normal  Mood:  Anxious  Affect:  Appropriate and Congruent  Thought Process:  Coherent  Orientation:  Full (Time, Place, and Person)  Thought Content:  Logical  Suicidal Thoughts:  No  Homicidal Thoughts:  No  Memory:  Immediate;   Fair Recent;   Fair Remote;   Fair  Judgement:  Fair  Insight:  Fair  Psychomotor Activity:  Psychomotor Retardation  Concentration:  Concentration: Fair and Attention Span: Fair  Recall:  [employer] of Knowledge:Fair  Language: Fair  Akathisia:  No  Handed:  Right  AIMS (if indicated):    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 Continue Seroquel 25 mg by mouth daily at bedtime melatonin 3 mg at bedtime when necessary for insomnia.  Advised patient if he notices worsening of his symptoms he should come back for an earlier appointment otherwise  he'll follow-up in 4 weeks.    Patient will follow up in 2 months or earlier depending on his symptoms     More than 50% of the time spent in psychoeducation, counseling and coordination of care.    This note was generated in part or whole with voice recognition software. Voice regonition is usually quite accurate but there are transcription errors that can and very often do occur. I apologize for any typographical errors that were not detected and corrected.    Rainey Pines, MD 3/4/201910:42 AM

## 2017-06-13 ENCOUNTER — Other Ambulatory Visit: Payer: Self-pay

## 2017-06-13 ENCOUNTER — Other Ambulatory Visit: Payer: Medicare Other

## 2017-06-13 DIAGNOSIS — N401 Enlarged prostate with lower urinary tract symptoms: Principal | ICD-10-CM

## 2017-06-13 DIAGNOSIS — N138 Other obstructive and reflux uropathy: Secondary | ICD-10-CM | POA: Diagnosis not present

## 2017-06-14 LAB — PSA: Prostate Specific Ag, Serum: 6.4 ng/mL — ABNORMAL HIGH (ref 0.0–4.0)

## 2017-06-15 ENCOUNTER — Ambulatory Visit: Payer: Self-pay | Admitting: Urology

## 2017-06-20 NOTE — Progress Notes (Signed)
06/21/2017 10:26 AM   Luis Miller 1945-09-01 [id number]  Referring provider: Guadalupe Maple, MD [address] [place], [place] [address]  Chief Complaint  Patient presents with  . Elevated PSA    HPI: 72 yo WM with an elevated PSA, ED and BPH with LU TS who presents today for a one year follow up.  Elevated PSA Patient had a negative bx in 2002 for a PSA of 4.03 ng/mL.  His current PSA is 6.4 ng/mL on 06/13/2017.  He has had a < 0.75 ng/mL rise per year.     Erectile dysfunction IPP placement for a malfunction IPP on 08/25/2015.  He is experiencing ejaculatory disorders.  He only ejaculates on occasion.  He is having orgasms.  He was recently started on cyproheptadine.  He reports that it is helping with orgasm.    BPH WITH LUTS His IPSS score today is 8, which is moderate lower urinary tract symptomatology. He is mixed with his quality life due to his urinary symptoms.  His major complaints are frequency and nocturia.  His previous I PSS score was 5/1.  He denies any dysuria, hematuria or suprapubic pain.  He also denies any recent fevers, chills, nausea or vomiting.  He is adopted.   His nephrologist, Dr. Holley Raring, is having him drink 64 ounces of water daily.   IPSS    Row Name 06/21/17 1000         International Prostate Symptom Score   How often have you had the sensation of not emptying your bladder?  Not at All     How often have you had to urinate less than every two hours?  Almost always     How often have you found you stopped and started again several times when you urinated?  Not at All     How often have you found it difficult to postpone urination?  Less than 1 in 5 times     How often have you had a weak urinary stream?  Not at All     How often have you had to strain to start urination?  Not at All     How many times did you typically get up at night to urinate?  2 Times     Total IPSS Score  8       Quality of Life due to urinary symptoms   If you  were to spend the rest of your life with your urinary condition just the way it is now how would you feel about that?  Mixed        Score:  1-7 Mild 8-19 Moderate 20-35 Severe   PMH: Past Medical History:  Diagnosis Date  . Anemia   . Apnea, sleep   . Bipolar affective ([place])   . Cancer ([place]) malignant melanoma  . Crohn's disease ([place])   . Depression   . Diabetes insipidus ([place])   . Erectile dysfunction   . Hyperlipidemia   . Hypertension   . IBS (irritable bowel syndrome)   . Impotence   . Internal hemorrhoids   . 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 Paraphimosis   . Peyronie disease   . Secondary hyperparathyroidism of renal origin ([place])   . Skin cancer   . Testicular hypofunction   . Urinary frequency   . Urinary hesitancy     Surgical History: Past Surgical History:  Procedure Laterality Date  . arm fracture    . BONE MARROW  BIOPSY    . COLONOSCOPY WITH PROPOFOL N/A 12/23/2014   Procedure: COLONOSCOPY WITH PROPOFOL;  Surgeon: Manya Silvas, MD;  Location: [facility] ENDOSCOPY;  Service: Endoscopy;  Laterality: N/A;  . MELANOMA EXCISION    . PENILE PROSTHESIS IMPLANT    . PENILE PROSTHESIS IMPLANT N/A 08/25/2015   Procedure: REPLACEMENT OF INFLATABLE PENILE PROSTHESIS COMPONENTS;  Surgeon: Cleon Gustin, MD;  Location: [facility] ORS;  Service: Urology;  Laterality: N/A;  . TONSILLECTOMY    . VASECTOMY      Home Medications:  Allergies as of 06/21/2017      Reactions   Mirtazapine Rash   Olanzapine Rash   Aspirin    Other reaction(s): Unknown Patient was instructed not to take aspirin but can not remember why   Atomoxetine Other (See Comments)   Urinary sx   Strattera [atomoxetine Hcl] Other (See Comments)   Urinary sx      Medication List        Accurate as of 06/21/17 10:26 AM. Always use your most recent med list.          allopurinol 100 MG tablet Commonly known as:  ZYLOPRIM Take 100 mg by mouth daily.   aMILoride 5 MG tablet Commonly known  as:  MIDAMOR Take 1 tablet (5 mg total) by mouth daily.   amLODipine 10 MG tablet Commonly known as:  NORVASC Take 1 tablet (10 mg total) by mouth daily.   benazepril 40 MG tablet Commonly known as:  LOTENSIN Take 40 mg by mouth daily.   Colchicine 0.6 MG Caps Take 0.3 mg by mouth every morning. Take 2 today   cyproheptadine 4 MG tablet Commonly known as:  PERIACTIN Take one tablet three hours prior to intercourse   FOLIC ACID PO Take by mouth.   hydrALAZINE 100 MG tablet Commonly known as:  APRESOLINE Take 100 mg by mouth 3 (three) times daily.   lithium carbonate 300 MG CR tablet Commonly known as:  LITHOBID Take 1 tablet (300 mg total) by mouth at bedtime.   Melatonin 3 MG Tabs Take 1 tablet (3 mg total) by mouth at bedtime.   multivitamin tablet Take 1 tablet by mouth daily.   QUEtiapine 25 MG tablet Commonly known as:  SEROQUEL Take 1 tablet (25 mg total) by mouth at bedtime.   VITAMIN D (CHOLECALCIFEROL) PO Take 1 tablet by mouth daily.       Allergies:  Allergies  Allergen Reactions  . Mirtazapine Rash  . Olanzapine Rash  . Aspirin     Other reaction(s): Unknown Patient was instructed not to take aspirin but can not remember why  . Atomoxetine Other (See Comments)    Urinary sx  . Strattera [Atomoxetine Hcl] Other (See Comments)    Urinary sx    Family History: Family History  Problem Relation Age of Onset  . Depression Sister   . Bipolar disorder Other   . Prostate cancer Neg Hx   . Kidney cancer Neg Hx   . Bladder Cancer Neg Hx     Social History:  reports that he quit smoking about 44 years ago. His smoking use included cigarettes. He has never used smokeless tobacco. He reports that he drinks alcohol. He reports that he does not use drugs.  ROS: UROLOGY Frequent Urination?: Yes Hard to postpone urination?: No Burning/pain with urination?: No Get up at night to urinate?: Yes Leakage of urine?: No Urine stream starts and stops?:  No Trouble starting stream?: No Do you have to  strain to urinate?: No Blood in urine?: No Urinary tract infection?: No Sexually transmitted disease?: No Injury to kidneys or bladder?: No Painful intercourse?: No Weak stream?: No Erection problems?: No Penile pain?: No  Gastrointestinal Nausea?: No Vomiting?: No Indigestion/heartburn?: No Diarrhea?: Yes Constipation?: Yes  Constitutional Fever: No Night sweats?: No Weight loss?: No Fatigue?: No  Skin Skin rash/lesions?: No Itching?: No  Eyes Blurred vision?: No Double vision?: No  Ears/Nose/Throat Sore throat?: No Sinus problems?: Yes  Hematologic/Lymphatic Swollen glands?: No Easy bruising?: Yes  Cardiovascular Leg swelling?: Yes Chest pain?: No  Respiratory Cough?: No Shortness of breath?: No  Endocrine Excessive thirst?: No  Musculoskeletal Back pain?: No Joint pain?: Yes  Neurological Headaches?: No Dizziness?: No  Psychologic Depression?: No Anxiety?: No  Physical Exam: BP (!) 150/77 (BP Location: Right Arm, Patient Position: Sitting, Cuff Size: Large)   Pulse 76   Ht 5' 6.25" (1.683 m)   Wt 200 lb 3.2 oz (90.8 kg)   BMI 32.07 kg/m   Constitutional: Well nourished. Alert and oriented, No acute distress. HEENT: [place] AT, moist mucus membranes. Trachea midline, no masses. Cardiovascular: No clubbing, cyanosis, or edema. Respiratory: Normal respiratory effort, no increased work of breathing. GI: Abdomen is soft, non tender, non distended, no abdominal masses. Liver and spleen not palpable.  No hernias appreciated.  Stool sample for occult testing is not indicated.   GU: No CVA tenderness.  No bladder fullness or masses.  Patient with circumcised phallus.  Urethral meatus is patent.  No penile discharge. No penile lesions or rashes. Glans floppy.  Cylinders in place.  Scrotum without lesions, cysts, rashes and/or edema.  Testicles are located scrotally bilaterally. No masses are appreciated  in the testicles. Left and right epididymis are normal.  Pump palpable high within the left hemiscrotum.   Rectal: Patient with  normal sphincter tone. Anus and perineum without scarring or rashes. No rectal masses are appreciated. Prostate is approximately 50 grams, no nodules are appreciated. Seminal vesicles are normal. Skin: No rashes, bruises or suspicious lesions. Lymph: No cervical or inguinal adenopathy. Neurologic: Grossly intact, no focal deficits, moving all 4 extremities. Psychiatric: Normal mood and affect.   Laboratory Data: Lab Results  Component Value Date   WBC 10.1 04/05/2017   HGB 12.8 (L) 04/05/2017   HCT 36.4 (L) 04/05/2017   MCV 98 (H) 04/05/2017   PLT 198 04/05/2017    Lab Results  Component Value Date   CREATININE 2.77 (H) 04/05/2017    Lab Results  Component Value Date   TSH 0.213 (L) 04/05/2017       Component Value Date/Time   CHOL 202 (H) 04/05/2017 1154   HDL 43 04/05/2017 1154   CHOLHDL 4.7 04/05/2017 1154   LDLCALC 111 (H) 04/05/2017 1154    Lab Results  Component Value Date   AST 14 04/05/2017   Lab Results  Component Value Date   ALT 15 04/05/2017   PSA History  5.6 ng/mL on 11/27/2013  4.9 ng/mL on 05/28/2014  5.7 ng/mL on 09/09/2014  5.5 ng/mL on 06/03/2015  6.4 ng/mL on 11/11/2015  6.4 ng/mL on 05/31/2016  5.6 ng/mL on 12/06/2016  7.1 ng/mL on 04/05/2017  6.4 ng/mL on 06/13/2017 I have reviewed the labs.  Assessment & Plan:    1. Elevated PSA  - PSA currently is 6.4 - according to the PCPT risk calculator if a biopsy was performed at this time the results would be 74% probability of a negative result, 15% probability of a  low-grade cancer, 11% probability of a high-grade cancer and a 2 to 4% probability of having an infection that may result in hospitalization  - RTC in 6 months for exam and PSA  2. Erectile dysfunction  - Satisfied with IPP  - RTC in 6 months for exam  3. Ejaculatory disorder  - explained to the  patient that this is a multi-factorial issue   - script for cyproheptadine 4 mg, take one tablet three hours prior to intercourse   4. BPH with LUTS  - IPSS score is 8/3, it is slightly worse  - Continue conservative management, avoiding bladder irritants and timed voiding's  - RTC in 6 months for IPSS, PSA and exam    Return in about 6 months (around 12/21/2017) for IPSS, PSA and exam.  These notes generated with voice recognition software. I apologize for typographical errors.  Zara Council, [facility] [address] [place], [place] [address] [phone]

## 2017-06-21 ENCOUNTER — Encounter: Payer: Self-pay | Admitting: Urology

## 2017-06-21 ENCOUNTER — Ambulatory Visit (INDEPENDENT_AMBULATORY_CARE_PROVIDER_SITE_OTHER): Payer: Medicare Other | Admitting: Urology

## 2017-06-21 VITALS — BP 150/77 | HR 76 | Ht 66.25 in | Wt 200.2 lb

## 2017-06-21 DIAGNOSIS — N5319 Other ejaculatory dysfunction: Secondary | ICD-10-CM

## 2017-06-21 DIAGNOSIS — N401 Enlarged prostate with lower urinary tract symptoms: Secondary | ICD-10-CM

## 2017-06-21 DIAGNOSIS — N529 Male erectile dysfunction, unspecified: Secondary | ICD-10-CM | POA: Diagnosis not present

## 2017-06-21 DIAGNOSIS — R972 Elevated prostate specific antigen [PSA]: Secondary | ICD-10-CM | POA: Diagnosis not present

## 2017-06-21 DIAGNOSIS — N138 Other obstructive and reflux uropathy: Secondary | ICD-10-CM | POA: Diagnosis not present

## 2017-07-12 DIAGNOSIS — Z79899 Other long term (current) drug therapy: Secondary | ICD-10-CM | POA: Diagnosis not present

## 2017-07-12 DIAGNOSIS — E538 Deficiency of other specified B group vitamins: Secondary | ICD-10-CM | POA: Diagnosis not present

## 2017-07-12 DIAGNOSIS — R946 Abnormal results of thyroid function studies: Secondary | ICD-10-CM | POA: Diagnosis not present

## 2017-07-14 DIAGNOSIS — D0361 Melanoma in situ of right upper limb, including shoulder: Secondary | ICD-10-CM | POA: Diagnosis not present

## 2017-07-14 DIAGNOSIS — L570 Actinic keratosis: Secondary | ICD-10-CM | POA: Diagnosis not present

## 2017-07-14 DIAGNOSIS — D485 Neoplasm of uncertain behavior of skin: Secondary | ICD-10-CM | POA: Diagnosis not present

## 2017-07-14 DIAGNOSIS — D2261 Melanocytic nevi of right upper limb, including shoulder: Secondary | ICD-10-CM | POA: Diagnosis not present

## 2017-07-14 DIAGNOSIS — D2262 Melanocytic nevi of left upper limb, including shoulder: Secondary | ICD-10-CM | POA: Diagnosis not present

## 2017-07-14 DIAGNOSIS — D225 Melanocytic nevi of trunk: Secondary | ICD-10-CM | POA: Diagnosis not present

## 2017-07-14 DIAGNOSIS — X32XXXA Exposure to sunlight, initial encounter: Secondary | ICD-10-CM | POA: Diagnosis not present

## 2017-07-14 DIAGNOSIS — Z85828 Personal history of other malignant neoplasm of skin: Secondary | ICD-10-CM | POA: Diagnosis not present

## 2017-07-14 DIAGNOSIS — Z85820 Personal history of malignant melanoma of skin: Secondary | ICD-10-CM | POA: Diagnosis not present

## 2017-07-14 DIAGNOSIS — D2272 Melanocytic nevi of left lower limb, including hip: Secondary | ICD-10-CM | POA: Diagnosis not present

## 2017-07-14 DIAGNOSIS — L821 Other seborrheic keratosis: Secondary | ICD-10-CM | POA: Diagnosis not present

## 2017-07-19 DIAGNOSIS — Z79899 Other long term (current) drug therapy: Secondary | ICD-10-CM | POA: Diagnosis not present

## 2017-07-19 DIAGNOSIS — R251 Tremor, unspecified: Secondary | ICD-10-CM | POA: Diagnosis not present

## 2017-07-19 DIAGNOSIS — R946 Abnormal results of thyroid function studies: Secondary | ICD-10-CM | POA: Diagnosis not present

## 2017-07-22 DIAGNOSIS — D0362 Melanoma in situ of left upper limb, including shoulder: Secondary | ICD-10-CM | POA: Diagnosis not present

## 2017-07-22 DIAGNOSIS — L905 Scar conditions and fibrosis of skin: Secondary | ICD-10-CM | POA: Diagnosis not present

## 2017-07-25 ENCOUNTER — Ambulatory Visit: Payer: Self-pay | Admitting: Psychiatry

## 2017-07-25 DIAGNOSIS — M25562 Pain in left knee: Secondary | ICD-10-CM | POA: Diagnosis not present

## 2017-07-25 DIAGNOSIS — M1712 Unilateral primary osteoarthritis, left knee: Secondary | ICD-10-CM | POA: Diagnosis not present

## 2017-07-26 ENCOUNTER — Telehealth: Payer: Self-pay | Admitting: Family Medicine

## 2017-07-26 NOTE — Telephone Encounter (Signed)
Copied from [place] [id number]. Topic: Quick Communication - See Telephone Encounter >> Jul 26, 2017  9:39 AM Cleaster Corin, NT wrote: CRM for notification. See Telephone encounter for: 07/26/17.  Pt. Calling in reference to bill for date of service 04/05/17 (physical)

## 2017-07-27 NOTE — Telephone Encounter (Signed)
Patient calling to in regards to receiving a phone call. States he has not received a call about his bill and wanted to see what his next steps are. Please advise.

## 2017-07-28 ENCOUNTER — Other Ambulatory Visit: Payer: Self-pay

## 2017-07-28 ENCOUNTER — Encounter: Payer: Self-pay | Admitting: Psychiatry

## 2017-07-28 ENCOUNTER — Ambulatory Visit: Payer: Medicare Other | Admitting: Psychiatry

## 2017-07-28 ENCOUNTER — Ambulatory Visit: Payer: Self-pay | Admitting: Psychiatry

## 2017-07-28 VITALS — BP 126/68 | HR 65 | Temp 97.7°F | Wt 205.0 lb

## 2017-07-28 DIAGNOSIS — F3160 Bipolar disorder, current episode mixed, unspecified: Secondary | ICD-10-CM

## 2017-07-28 MED ORDER — QUETIAPINE FUMARATE 25 MG PO TABS: 25.0000 mg | ORAL_TABLET | Freq: Every day | ORAL | 1 refills | Status: DC

## 2017-07-28 MED ORDER — LITHIUM CARBONATE ER 300 MG PO TBCR: 300.0000 mg | EXTENDED_RELEASE_TABLET | Freq: Every day | ORAL | 1 refills | Status: DC

## 2017-07-28 NOTE — Telephone Encounter (Signed)
Spoke with patient. Explained the situation to patient and that Alwyn Ren would contact patient once she received a response

## 2017-07-28 NOTE — Telephone Encounter (Signed)
Please let Luis Miller know that I have sent a request to [employer] Billing Department for a coding review.  I will notify him as soon as I receive a response.

## 2017-07-28 NOTE — Progress Notes (Signed)
Psychiatric MD Progress Note   Patient Identification: Luis Miller. MRN:  [id number] Date of Evaluation:  07/28/2017 Referral Source: Golden Pop, M.D Chief Complaint:   Chief Complaint    Follow-up; Medication Refill     Visit Diagnosis:    ICD-10-CM   1. Bipolar I disorder, most recent episode mixed ([place]) F31.60     History of Present Illness:    Patient is a 72 year old divorced male with history of bipolar disorder presented for follow up. He stated that he has been doing well and his tremors have decreased since the dose of him was decreased to 300 mg.  He reported that he has been feeling better.  He stated that he takes them as prescribed.  He stated that he has his appointment with his nephrologist next week.  Patient reported that he does not have any side effects of the medications at this time.  He is still having some memory issues remembering things which might be related to his concentration problems.  Patient reported that he is going for his neurology appointment on a regular basis.  He appears pleasant and cooperative during the interview.  He currently denied having any suicidal or homicidal ideations or plans.  No acute symptoms noted at this time.   He enjoys dancing on a weekly basis on Friday nights.  Patient sleeps well at night and does not take any melatonin or sleeping aids.  He reported that he has to drink 3 bottles of water on a daily basis as suggested by his nephrologist and we discussed about that he should stop drinking at night so he does not have to wake up in the middle of the night and he agreed with the plan.           Associated Signs/Symptoms: Depression Symptoms:  fatigue, loss of energy/fatigue, (Hypo) Manic Symptoms:  denied Anxiety Symptoms:  Excessive Worry, Psychotic Symptoms:  none PTSD Symptoms: Negative NA  Past Psychiatric History:  Long history of bipolar disorder. He reported that he has seen Dr. Vallarie Mare for many years in  the past. ECT Treatment - several  Psychiatric Admission x 4, [facility], [facility], [place], [place] No h/o Suicide attemtps.   Previous Psychotropic Medications:  Lithium Mellaril Seroquel Does not remember the names.   Allergies- Remeron Olanzapine Strattera   Substance Abuse History in the last 12 months:  No.  Consequences of Substance Abuse: Negative NA  Past Medical History:  Past Medical History:  Diagnosis Date  . Anemia   . Apnea, sleep   . Bipolar affective ([place])   . Cancer ([place]) malignant melanoma  . Crohn's disease ([place])   . Depression   . Diabetes insipidus ([place])   . Erectile dysfunction   . Hyperlipidemia   . Hypertension   . IBS (irritable bowel syndrome)   . Impotence   . Internal hemorrhoids   . 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[place])   . Paraphimosis   . Peyronie disease   . Secondary hyperparathyroidism of renal origin ([place])   . Skin cancer   . Testicular hypofunction   . Urinary frequency   . Urinary hesitancy     Past Surgical History:  Procedure Laterality Date  . arm fracture    . BONE MARROW BIOPSY    . COLONOSCOPY WITH PROPOFOL N/A 12/23/2014   Procedure: COLONOSCOPY WITH PROPOFOL;  Surgeon: Manya Silvas, MD;  Location[facility];  Service: Endoscopy;  Laterality: N/A;  . MELANOMA EXCISION    . PENILE PROSTHESIS  IMPLANT    . PENILE PROSTHESIS IMPLANT N/A 08/25/2015   Procedure: REPLACEMENT OF INFLATABLE PENILE PROSTHESIS COMPONENTS;  Surgeon: Cleon Gustin, MD;  Location: [facility] ORS;  Service: Urology;  Laterality: N/A;  . TONSILLECTOMY    . VASECTOMY      Family Psychiatric History:  He reported bipolar disorder in his sister and nephew.  Family History:  Family History  Problem Relation Age of Onset  . Depression Sister   . Bipolar disorder Other   . Prostate cancer Neg Hx   . Kidney cancer Neg Hx   . Bladder Cancer Neg Hx     Social History:   Social History   Socioeconomic History  . Marital status: Divorced     Spouse name: Not on file  . Number of children: 2  . Years of education: Not on file  . Highest education level: High school graduate  Occupational History  . Occupation: retired  Scientific laboratory technician  . Financial resource strain: Not hard at all  . Food insecurity:    Worry: Never true    Inability: Never true  . Transportation needs:    Medical: No    Non-medical: No  Tobacco Use  . Smoking status: Former Smoker    Types: Cigarettes    Last attempt to quit: 09/08/1972    Years since quitting: 44.9  . Smokeless tobacco: Never Used  Substance and Sexual Activity  . Alcohol use: Yes    Alcohol/week: 0.0 oz    Comment: Occasional glass of wine  . Drug use: No  . Sexual activity: Yes    Partners: Female    Birth control/protection: None  Lifestyle  . Physical activity:    Days per week: 1 day    Minutes per session: 120 min  . Stress: Not at all  Relationships  . Social connections:    Talks on phone: More than three times a week    Gets together: Twice a week    Attends religious service: More than 4 times per year    Active member of club or organization: Yes    Attends meetings of clubs or organizations: More than 4 times per year    Relationship status: Divorced  Other Topics Concern  . Not on file  Social History Narrative  . Not on file    Additional Social History:  Divorced- x 2 Lives with GF, she is 73 Retired- from [employer].    Allergies:   Allergies  Allergen Reactions  . Mirtazapine Rash  . Olanzapine Rash  . Aspirin     Other reaction(s): Unknown Patient was instructed not to take aspirin but can not remember why  . Atomoxetine Other (See Comments)    Urinary sx  . Strattera [Atomoxetine Hcl] Other (See Comments)    Urinary sx    Metabolic Disorder Labs: No results found for: HGBA1C, MPG No results found for: PROLACTIN Lab Results  Component Value Date   CHOL 202 (H) 04/05/2017   TRIG 241 (H) 04/05/2017   HDL 43 04/05/2017   CHOLHDL 4.7 04/05/2017    LDLCALC 111 (H) 04/05/2017   LDLCALC 95 06/07/2016     Current Medications: Current Outpatient Medications  Medication Sig Dispense Refill  . allopurinol (ZYLOPRIM) 100 MG tablet Take 100 mg by mouth daily.     Marland Kitchen aMILoride (MIDAMOR) 5 MG tablet Take 1 tablet (5 mg total) by mouth daily. 90 tablet 4  . amLODipine (NORVASC) 10 MG tablet Take 1 tablet (10 mg total)  by mouth daily. 90 tablet 4  . benazepril (LOTENSIN) 40 MG tablet Take 40 mg by mouth daily.     . Colchicine 0.6 MG CAPS Take 0.3 mg by mouth every morning. Take 2 today 30 capsule 3  . cyproheptadine (PERIACTIN) 4 MG tablet Take one tablet three hours prior to intercourse 90 tablet 3  . FOLIC ACID PO Take by mouth.    . hydrALAZINE (APRESOLINE) 100 MG tablet Take 100 mg by mouth 3 (three) times daily.    Marland Kitchen lithium carbonate (LITHOBID) 300 MG CR tablet Take 1 tablet (300 mg total) by mouth at bedtime. 90 tablet 1  . Melatonin 3 MG TABS Take 1 tablet (3 mg total) by mouth at bedtime. 30 tablet 1  . Multiple Vitamin (MULTIVITAMIN) tablet Take 1 tablet by mouth daily.    . QUEtiapine (SEROQUEL) 25 MG tablet Take 1 tablet (25 mg total) by mouth at bedtime. 90 tablet 1  . VITAMIN D, CHOLECALCIFEROL, PO Take 1 tablet by mouth daily.      No current facility-administered medications for this visit.     Neurologic: Headache: No Seizure: No Paresthesias:No  Musculoskeletal: Strength & Muscle Tone: within normal limits Gait & Station: normal Patient leans: N/A  Psychiatric Specialty Exam: ROS  Blood pressure 126/68, pulse 65, temperature 97.7 F (36.5 C), temperature source Oral, weight 205 lb (93 kg).Body mass index is 32.84 kg/m.  General Appearance: Casual  Eye Contact:  Fair  Speech:  Clear and Coherent  Volume:  Normal  Mood:  Anxious  Affect:  Appropriate and Congruent  Thought Process:  Coherent  Orientation:  Full (Time, Place, and Person)  Thought Content:  Logical  Suicidal Thoughts:  No  Homicidal  Thoughts:  No  Memory:  Immediate;   Fair Recent;   Fair Remote;   Fair  Judgement:  Fair  Insight:  Fair  Psychomotor Activity:  Psychomotor Retardation  Concentration:  Concentration: Fair and Attention Span: Fair  Recall:  [employer] of Knowledge:Fair  Language: Fair  Akathisia:  No  Handed:  Right  AIMS (if indicated):    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 Continue Seroquel 25 mg by mouth daily at bedtime Patient does not  take melatonin to help him sleep at night.        Patient will follow up in 2 months or earlier depending on his symptoms     More than 50% of the time spent in psychoeducation, counseling and coordination of care.    This note was generated in part or whole with voice recognition software. Voice regonition is usually quite accurate but there are transcription errors that can and very often do occur. I apologize for any typographical errors that were not detected and corrected.    Rainey Pines, MD 5/9/201910:33 AM

## 2017-07-28 NOTE — Telephone Encounter (Signed)
Please advise. Thank you

## 2017-08-01 DIAGNOSIS — E232 Diabetes insipidus: Secondary | ICD-10-CM | POA: Diagnosis not present

## 2017-08-01 DIAGNOSIS — I10 Essential (primary) hypertension: Secondary | ICD-10-CM | POA: Diagnosis not present

## 2017-08-01 DIAGNOSIS — N184 Chronic kidney disease, stage 4 (severe): Secondary | ICD-10-CM | POA: Diagnosis not present

## 2017-08-01 DIAGNOSIS — N2581 Secondary hyperparathyroidism of renal origin: Secondary | ICD-10-CM | POA: Diagnosis not present

## 2017-08-02 DIAGNOSIS — G251 Drug-induced tremor: Secondary | ICD-10-CM | POA: Diagnosis not present

## 2017-08-02 DIAGNOSIS — R413 Other amnesia: Secondary | ICD-10-CM | POA: Diagnosis not present

## 2017-08-02 DIAGNOSIS — Z9989 Dependence on other enabling machines and devices: Secondary | ICD-10-CM | POA: Diagnosis not present

## 2017-08-02 DIAGNOSIS — R0989 Other specified symptoms and signs involving the circulatory and respiratory systems: Secondary | ICD-10-CM | POA: Diagnosis not present

## 2017-08-02 DIAGNOSIS — G4733 Obstructive sleep apnea (adult) (pediatric): Secondary | ICD-10-CM | POA: Diagnosis not present

## 2017-08-05 DIAGNOSIS — D485 Neoplasm of uncertain behavior of skin: Secondary | ICD-10-CM | POA: Diagnosis not present

## 2017-08-05 DIAGNOSIS — L570 Actinic keratosis: Secondary | ICD-10-CM | POA: Diagnosis not present

## 2017-08-09 NOTE — Telephone Encounter (Signed)
Please let Mr. Cranor know that he now has a $0.00 balance with our office.  Thanks, kbs

## 2017-08-09 NOTE — Telephone Encounter (Signed)
Spoke with patient to give him the information regarding his bill being $0 per [place]

## 2017-08-18 ENCOUNTER — Telehealth: Payer: Self-pay

## 2017-08-18 NOTE — Telephone Encounter (Signed)
pt called stated that his dr. Zollie Scale did his lithium level and it was 0.5

## 2017-08-31 DIAGNOSIS — D485 Neoplasm of uncertain behavior of skin: Secondary | ICD-10-CM | POA: Diagnosis not present

## 2017-08-31 DIAGNOSIS — C44329 Squamous cell carcinoma of skin of other parts of face: Secondary | ICD-10-CM | POA: Diagnosis not present

## 2017-08-31 DIAGNOSIS — L821 Other seborrheic keratosis: Secondary | ICD-10-CM | POA: Diagnosis not present

## 2017-10-03 ENCOUNTER — Encounter: Payer: Self-pay | Admitting: Family Medicine

## 2017-10-03 ENCOUNTER — Ambulatory Visit (INDEPENDENT_AMBULATORY_CARE_PROVIDER_SITE_OTHER): Payer: Medicare Other | Admitting: Family Medicine

## 2017-10-03 VITALS — BP 136/78 | HR 53 | Wt 203.6 lb

## 2017-10-03 DIAGNOSIS — I10 Essential (primary) hypertension: Secondary | ICD-10-CM | POA: Diagnosis not present

## 2017-10-03 DIAGNOSIS — M10372 Gout due to renal impairment, left ankle and foot: Secondary | ICD-10-CM

## 2017-10-03 DIAGNOSIS — N486 Induration penis plastica: Secondary | ICD-10-CM | POA: Diagnosis not present

## 2017-10-03 DIAGNOSIS — N183 Chronic kidney disease, stage 3 unspecified: Secondary | ICD-10-CM

## 2017-10-03 DIAGNOSIS — M10371 Gout due to renal impairment, right ankle and foot: Secondary | ICD-10-CM

## 2017-10-03 LAB — LP+ALT+AST PICCOLO, WAIVED
ALT (SGPT) Piccolo, Waived: 20 U/L (ref 10–47)
AST (SGOT) Piccolo, Waived: 21 U/L (ref 11–38)
CHOL/HDL RATIO PICCOLO,WAIVE: 4.7 mg/dL
Cholesterol Piccolo, Waived: 180 mg/dL (ref <200)
HDL Chol Piccolo, Waived: 39 mg/dL — ABNORMAL LOW (ref >59)
LDL Chol Calc Piccolo Waived: 91 mg/dL (ref <100)
TRIGLYCERIDES PICCOLO,WAIVED: 252 mg/dL — AB (ref <150)
VLDL CHOL CALC PICCOLO,WAIVE: 50 mg/dL — AB (ref <30)

## 2017-10-03 NOTE — Progress Notes (Signed)
BP 136/78   Pulse (!) 53   Wt 203 lb 9.6 oz (92.4 kg)   SpO2 98%   BMI 32.61 kg/m    Subjective:    Patient ID: Luis Coss., male    DOB: Aug 02, 1945, 72 y.o.   MRN: [id number]  HPI: Luis Ozment. is a 72 y.o. male  Chief Complaint  Patient presents with  . Follow-up  . Hypertension  . Hyperlipidemia  Patient follow-up doing well all in all followed by neurology and has helped with tremor by decreasing nerve medications which is been led by his psychiatrist.  Patient's retained good control over his nerves and tremor has improved somewhat. Blood pressure cholesterol doing well.  Not taking cholesterol medications his neurologist was wondering about going on cholesterol medication but at this point not seeing any real indication.  Relevant past medical, surgical, family and social history reviewed and updated as indicated. Interim medical history since our last visit reviewed. Allergies and medications reviewed and updated.  Review of Systems  Constitutional: Negative.   Respiratory: Negative.   Cardiovascular: Negative.     Per HPI unless specifically indicated above     Objective:    BP 136/78   Pulse (!) 53   Wt 203 lb 9.6 oz (92.4 kg)   SpO2 98%   BMI 32.61 kg/m   Wt Readings from Last 3 Encounters:  10/03/17 203 lb 9.6 oz (92.4 kg)  06/21/17 200 lb 3.2 oz (90.8 kg)  04/05/17 201 lb (91.2 kg)    Physical Exam  Constitutional: He is oriented to person, place, and time. He appears well-developed and well-nourished.  HENT:  Head: Normocephalic and atraumatic.  Eyes: Conjunctivae and EOM are normal.  Neck: Normal range of motion.  Cardiovascular: Normal rate, regular rhythm and normal heart sounds.  Pulmonary/Chest: Effort normal and breath sounds normal.  Musculoskeletal: Normal range of motion.  Neurological: He is alert and oriented to person, place, and time.  Skin: No erythema.  Psychiatric: He has a normal mood and affect. His behavior is  normal. Judgment and thought content normal.    Results for orders placed or performed in visit on 06/13/17  PSA  Result Value Ref Range   Prostate Specific Ag, Serum 6.4 (H) 0.0 - 4.0 ng/mL      Assessment & Plan:   Problem List Items Addressed This Visit      Cardiovascular and Mediastinum   Hypertension - Primary (Chronic)    The current medical regimen is effective;  continue present plan and medications.       Relevant Orders   Uric acid   Basic metabolic panel   LP+ALT+AST Piccolo, Waived     Musculoskeletal and Integument   Acute gout due to renal impairment involving right foot    The current medical regimen is effective;  continue present plan and medications.       Relevant Orders   Uric acid   Basic metabolic panel   LP+ALT+AST Piccolo, Waived   Peyronie's disease     Genitourinary   CKD (chronic kidney disease) stage 3, GFR 30-59 ml/min (HCC)    Followed by nephrology        Other   Gout   Relevant Orders   Uric acid   Basic metabolic panel   LP+ALT+AST Piccolo, Waived   Uric acid   Basic metabolic panel   LP+ALT+AST Piccolo, Waived       Follow up plan: Return in about 6 months (around  04/05/2018) for Physical Exam.

## 2017-10-03 NOTE — Assessment & Plan Note (Signed)
Followed by nephrology. 

## 2017-10-03 NOTE — Assessment & Plan Note (Signed)
The current medical regimen is effective;  continue present plan and medications.  

## 2017-10-04 LAB — BASIC METABOLIC PANEL
BUN/Creatinine Ratio: 13 (ref 10–24)
BUN: 40 mg/dL — ABNORMAL HIGH (ref 8–27)
CO2: 20 mmol/L (ref 20–29)
Calcium: 10 mg/dL (ref 8.6–10.2)
Chloride: 113 mmol/L — ABNORMAL HIGH (ref 96–106)
Creatinine, Ser: 3.15 mg/dL — ABNORMAL HIGH (ref 0.76–1.27)
GFR calc Af Amer: 22 mL/min/{1.73_m2} — ABNORMAL LOW (ref >59)
GFR, EST NON AFRICAN AMERICAN: 19 mL/min/{1.73_m2} — AB (ref >59)
GLUCOSE: 115 mg/dL — AB (ref 65–99)
POTASSIUM: 4.2 mmol/L (ref 3.5–5.2)
SODIUM: 148 mmol/L — AB (ref 134–144)

## 2017-10-04 LAB — URIC ACID: URIC ACID: 4.1 mg/dL (ref 3.7–8.6)

## 2017-10-17 DIAGNOSIS — L578 Other skin changes due to chronic exposure to nonionizing radiation: Secondary | ICD-10-CM | POA: Diagnosis not present

## 2017-10-17 DIAGNOSIS — L814 Other melanin hyperpigmentation: Secondary | ICD-10-CM | POA: Diagnosis not present

## 2017-10-17 DIAGNOSIS — C44329 Squamous cell carcinoma of skin of other parts of face: Secondary | ICD-10-CM | POA: Diagnosis not present

## 2017-10-24 ENCOUNTER — Encounter: Payer: Self-pay | Admitting: Psychiatry

## 2017-10-24 ENCOUNTER — Ambulatory Visit (INDEPENDENT_AMBULATORY_CARE_PROVIDER_SITE_OTHER): Payer: Medicare Other | Admitting: Psychiatry

## 2017-10-24 ENCOUNTER — Other Ambulatory Visit: Payer: Self-pay

## 2017-10-24 VITALS — BP 139/68 | HR 67 | Temp 98.6°F | Wt 199.2 lb

## 2017-10-24 DIAGNOSIS — N184 Chronic kidney disease, stage 4 (severe): Secondary | ICD-10-CM | POA: Diagnosis not present

## 2017-10-24 DIAGNOSIS — I10 Essential (primary) hypertension: Secondary | ICD-10-CM | POA: Diagnosis not present

## 2017-10-24 DIAGNOSIS — Z0189 Encounter for other specified special examinations: Secondary | ICD-10-CM | POA: Diagnosis not present

## 2017-10-24 DIAGNOSIS — R809 Proteinuria, unspecified: Secondary | ICD-10-CM | POA: Diagnosis not present

## 2017-10-24 DIAGNOSIS — N2581 Secondary hyperparathyroidism of renal origin: Secondary | ICD-10-CM | POA: Diagnosis not present

## 2017-10-24 DIAGNOSIS — F3160 Bipolar disorder, current episode mixed, unspecified: Secondary | ICD-10-CM | POA: Diagnosis not present

## 2017-10-24 MED ORDER — QUETIAPINE FUMARATE 25 MG PO TABS: 25.0000 mg | ORAL_TABLET | Freq: Every day | ORAL | 1 refills | Status: DC

## 2017-10-24 MED ORDER — LITHIUM CARBONATE ER 300 MG PO TBCR: 300.0000 mg | EXTENDED_RELEASE_TABLET | Freq: Every day | ORAL | 1 refills | Status: DC

## 2017-10-24 NOTE — Progress Notes (Signed)
Psychiatric MD Progress Note   Patient Identification: Luis Miller. MRN:  [id number] Date of Evaluation:  10/24/2017 Referral Source: Golden Pop, M.D Chief Complaint:   Chief Complaint    Follow-up; Medication Refill     Visit Diagnosis:    ICD-10-CM   1. Bipolar I disorder, most recent episode mixed ([place]) F31.60     History of Present Illness:    Patient is a 72 year old divorced male with history of bipolar disorder presented for follow up. He was walking with a limping gait with the help of a walker.  He reported that he is having chronic knee pain.  He stated that he also had his melanoma removed from the side of the nose last week.  Patient reported that he is concerned about his chronic kidney disease as his kidney function has been declining steadily.  His primary care physician has recently done his labs he is concerned about his kidney function.  He reported that he is going to see his nephrologist Dr. Holley Raring this afternoon and will discuss with his kidney function.  He reported that he is a stable on his current medications.  I discussed with patient about the dose of lithium dialysis that the lithium dose will not matter.  Because the patient about starting him and on different medications for his bipolar disorder although he has been stable time..    Patient has been stable and his tremors have decreased.  He currently denied having any suicidal homicidal ideations or plans.  He denied having any perceptual disturbances.  He sleeps well at this time.  He does not take melatonin at night.   I left a message for Dr. Holley Raring at his practice about his kidney function and his lithium dose.  Awaiting his return call.              Associated Signs/Symptoms: Depression Symptoms:  fatigue, loss of energy/fatigue, (Hypo) Manic Symptoms:  denied Anxiety Symptoms:  Excessive Worry, Psychotic Symptoms:  none PTSD Symptoms: Negative NA  Past Psychiatric History:  Long  history of bipolar disorder. He reported that he has seen Dr. Vallarie Mare for many years in the past. ECT Treatment - several  Psychiatric Admission x 4, [facility], [facility], [place], [place] No h/o Suicide attemtps.   Previous Psychotropic Medications:  Lithium Mellaril Seroquel Does not remember the names.   Allergies- Remeron Olanzapine Strattera   Substance Abuse History in the last 12 months:  No.  Consequences of Substance Abuse: Negative NA  Past Medical History:  Past Medical History:  Diagnosis Date  . Anemia   . Apnea, sleep   . Bipolar affective ([place])   . Cancer ([place]) malignant melanoma  . Crohn's disease ([place])   . Depression   . Diabetes insipidus ([place])   . Erectile dysfunction   . Hyperlipidemia   . Hypertension   . IBS (irritable bowel syndrome)   . Impotence   . Internal hemorrhoids   . 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[place])   . Paraphimosis   . Peyronie disease   . Secondary hyperparathyroidism of renal origin ([place])   . Skin cancer   . Testicular hypofunction   . Urinary frequency   . Urinary hesitancy     Past Surgical History:  Procedure Laterality Date  . arm fracture    . BONE MARROW BIOPSY    . COLONOSCOPY WITH PROPOFOL N/A 12/23/2014   Procedure: COLONOSCOPY WITH PROPOFOL;  Surgeon: Manya Silvas, MD;  Location: [facility] ENDOSCOPY;  Service: Endoscopy;  Laterality: N/A;  . MELANOMA EXCISION    . PENILE PROSTHESIS IMPLANT    . PENILE PROSTHESIS IMPLANT N/A 08/25/2015   Procedure: REPLACEMENT OF INFLATABLE PENILE PROSTHESIS COMPONENTS;  Surgeon: Cleon Gustin, MD;  Location: [facility] ORS;  Service: Urology;  Laterality: N/A;  . TONSILLECTOMY    . VASECTOMY      Family Psychiatric History:  He reported bipolar disorder in his sister and nephew.  Family History:  Family History  Problem Relation Age of Onset  . Depression Sister   . Bipolar disorder Other   . Prostate cancer Neg Hx   . Kidney cancer Neg Hx   . Bladder Cancer Neg Hx     Social  History:   Social History   Socioeconomic History  . Marital status: Divorced    Spouse name: Not on file  . Number of children: 2  . Years of education: Not on file  . Highest education level: High school graduate  Occupational History  . Occupation: retired  Scientific laboratory technician  . Financial resource strain: Not hard at all  . Food insecurity:    Worry: Never true    Inability: Never true  . Transportation needs:    Medical: No    Non-medical: No  Tobacco Use  . Smoking status: Former Smoker    Types: Cigarettes    Last attempt to quit: 09/08/1972    Years since quitting: 45.1  . Smokeless tobacco: Never Used  Substance and Sexual Activity  . Alcohol use: Yes    Alcohol/week: 0.0 oz    Comment: Occasional glass of wine  . Drug use: No  . Sexual activity: Yes    Partners: Female    Birth control/protection: None  Lifestyle  . Physical activity:    Days per week: 1 day    Minutes per session: 120 min  . Stress: Not at all  Relationships  . Social connections:    Talks on phone: More than three times a week    Gets together: Twice a week    Attends religious service: More than 4 times per year    Active member of club or organization: Yes    Attends meetings of clubs or organizations: More than 4 times per year    Relationship status: Divorced  Other Topics Concern  . Not on file  Social History Narrative  . Not on file    Additional Social History:  Divorced- x 2 Lives with GF, she is 37 Retired- from [employer].    Allergies:   Allergies  Allergen Reactions  . Mirtazapine Rash  . Olanzapine Rash  . Aspirin     Other reaction(s): Unknown Patient was instructed not to take aspirin but can not remember why  . Atomoxetine Other (See Comments)    Urinary sx  . Strattera [Atomoxetine Hcl] Other (See Comments)    Urinary sx    Metabolic Disorder Labs: No results found for: HGBA1C, MPG No results found for: PROLACTIN Lab Results  Component Value Date   CHOL 180  10/03/2017   TRIG 252 (H) 10/03/2017   HDL 43 04/05/2017   CHOLHDL 4.7 04/05/2017   VLDL 50 (H) 10/03/2017   LDLCALC 111 (H) 04/05/2017   LDLCALC 95 06/07/2016     Current Medications: Current Outpatient Medications  Medication Sig Dispense Refill  . allopurinol (ZYLOPRIM) 100 MG tablet Take 100 mg by mouth daily.     Marland Kitchen aMILoride (MIDAMOR) 5 MG tablet Take 1 tablet (5 mg total) by mouth  daily. 90 tablet 4  . amLODipine (NORVASC) 10 MG tablet Take 1 tablet (10 mg total) by mouth daily. 90 tablet 4  . benazepril (LOTENSIN) 40 MG tablet Take 40 mg by mouth daily.     . Colchicine 0.6 MG CAPS Take 0.3 mg by mouth every morning. Take 2 today 30 capsule 3  . cyproheptadine (PERIACTIN) 4 MG tablet Take one tablet three hours prior to intercourse 90 tablet 3  . FOLIC ACID PO Take by mouth.    . hydrALAZINE (APRESOLINE) 100 MG tablet Take 100 mg by mouth 3 (three) times daily.    Marland Kitchen lithium carbonate (LITHOBID) 300 MG CR tablet Take 1 tablet (300 mg total) by mouth at bedtime. 90 tablet 1  . Melatonin 3 MG TABS Take 1 tablet (3 mg total) by mouth at bedtime. 30 tablet 1  . Multiple Vitamin (MULTIVITAMIN) tablet Take 1 tablet by mouth daily.    . QUEtiapine (SEROQUEL) 25 MG tablet Take 1 tablet (25 mg total) by mouth at bedtime. 90 tablet 1  . VITAMIN D, CHOLECALCIFEROL, PO Take 1 tablet by mouth daily.      No current facility-administered medications for this visit.     Neurologic: Headache: No Seizure: No Paresthesias:No  Musculoskeletal: Strength & Muscle Tone: within normal limits Gait & Station: normal Patient leans: N/A  Psychiatric Specialty Exam: ROS  Blood pressure 139/68, pulse 67, temperature 98.6 F (37 C), temperature source Oral, weight 199 lb 3.2 oz (90.4 kg).Body mass index is 31.91 kg/m.  General Appearance: Casual  Eye Contact:  Fair  Speech:  Clear and Coherent  Volume:  Normal  Mood:  Anxious  Affect:  Appropriate and Congruent  Thought Process:  Coherent   Orientation:  Full (Time, Place, and Person)  Thought Content:  Logical  Suicidal Thoughts:  No  Homicidal Thoughts:  No  Memory:  Immediate;   Fair Recent;   Fair Remote;   Fair  Judgement:  Fair  Insight:  Fair  Psychomotor Activity:  Psychomotor Retardation  Concentration:  Concentration: Fair and Attention Span: Fair  Recall:  [employer] of Knowledge:Fair  Language: Fair  Akathisia:  No  Handed:  Right  AIMS (if indicated):    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 Continue Seroquel 25 mg by mouth daily at bedtime   Left message for Dr. Holley Raring about his lithium dose and kidney function.  Discussed with patient about the same and he has a follow-up appointment with him this afternoon.      Patient will follow up in 2 months or earlier depending on his symptoms     More than 50% of the time spent in psychoeducation, counseling and coordination of care.    This note was generated in part or whole with voice recognition software. Voice regonition is usually quite accurate but there are transcription errors that can and very often do occur. I apologize for any typographical errors that were not detected and corrected.    Rainey Pines, MD 8/5/201910:10 AM

## 2017-10-27 ENCOUNTER — Other Ambulatory Visit: Payer: Self-pay

## 2017-10-27 ENCOUNTER — Ambulatory Visit (INDEPENDENT_AMBULATORY_CARE_PROVIDER_SITE_OTHER): Payer: Medicare Other | Admitting: Physician Assistant

## 2017-10-27 ENCOUNTER — Encounter: Payer: Self-pay | Admitting: Physician Assistant

## 2017-10-27 VITALS — BP 146/68 | HR 69 | Temp 98.5°F | Wt 198.6 lb

## 2017-10-27 DIAGNOSIS — J069 Acute upper respiratory infection, unspecified: Secondary | ICD-10-CM | POA: Diagnosis not present

## 2017-10-27 DIAGNOSIS — J45909 Unspecified asthma, uncomplicated: Secondary | ICD-10-CM | POA: Diagnosis not present

## 2017-10-27 DIAGNOSIS — N183 Chronic kidney disease, stage 3 unspecified: Secondary | ICD-10-CM

## 2017-10-27 NOTE — Progress Notes (Signed)
Subjective:    Patient ID: Luis Coss., male    DOB: [birth year], 72 y.o.   MRN: [id number]  Luis Sheeley. is a 72 y.o. male presenting on 10/27/2017 for Nasal Congestion (x 1 day. Has a dance Friday he wants to attend. ) and Sneezing (x 1 day. )   HPI   Runny nose x 1 day. Sneezing. Denies fever. Stage IV renal disease. CrCl 27.   Social History   Tobacco Use  . Smoking status: Former Smoker    Types: Cigarettes    Last attempt to quit: 09/08/1972    Years since quitting: 45.1  . Smokeless tobacco: Never Used  Substance Use Topics  . Alcohol use: Yes    Alcohol/week: 0.0 standard drinks    Comment: Occasional glass of wine  . Drug use: No    Review of Systems Per HPI unless specifically indicated above     Objective:    BP (!) 146/68   Pulse 69   Temp 98.5 F (36.9 C) (Oral)   Wt 198 lb 9.6 oz (90.1 kg)   SpO2 96%   BMI 31.81 kg/m   Wt Readings from Last 3 Encounters:  10/27/17 198 lb 9.6 oz (90.1 kg)  10/03/17 203 lb 9.6 oz (92.4 kg)  06/21/17 200 lb 3.2 oz (90.8 kg)    Physical Exam  Constitutional: He is oriented to person, place, and time. He appears well-developed and well-nourished.  HENT:  Right Ear: External ear normal.  Left Ear: External ear normal.  Nose: Rhinorrhea present.  Mouth/Throat: No oropharyngeal exudate.  Eyes: Conjunctivae are normal. Right eye exhibits discharge. Left eye exhibits discharge.  Cardiovascular: Normal rate and regular rhythm.  Pulmonary/Chest: Effort normal and breath sounds normal.  Neurological: He is alert and oriented to person, place, and time.   Results for orders placed or performed in visit on 10/03/17  Uric acid  Result Value Ref Range   Uric Acid 4.1 3.7 - 8.6 mg/dL  Basic metabolic panel  Result Value Ref Range   Glucose 115 (H) 65 - 99 mg/dL   BUN 40 (H) 8 - 27 mg/dL   Creatinine, Ser 3.15 (H) 0.76 - 1.27 mg/dL   GFR calc non Af Amer 19 (L) >59 mL/min/1.73   GFR calc Af Amer 22 (L) >59  mL/min/1.73   BUN/Creatinine Ratio 13 10 - 24   Sodium 148 (H) 134 - 144 mmol/L   Potassium 4.2 3.5 - 5.2 mmol/L   Chloride 113 (H) 96 - 106 mmol/L   CO2 20 20 - 29 mmol/L   Calcium 10.0 8.6 - 10.2 mg/dL  LP+ALT+AST Piccolo, Waived  Result Value Ref Range   ALT (SGPT) Piccolo, Waived 20 10 - 47 U/L   AST (SGOT) Piccolo, Waived 21 11 - 38 U/L   Cholesterol Piccolo, Waived 180 <200 mg/dL   HDL Chol Piccolo, Waived 39 (L) >59 mg/dL   Triglycerides Piccolo,Waived 252 (H) <150 mg/dL   Chol/HDL Ratio Piccolo,Waive 4.7 mg/dL   LDL Chol Calc Piccolo Waived 91 <100 mg/dL   VLDL Chol Calc Piccolo,Waive 50 (H) <30 mg/dL      Assessment & Plan:  1. Viral URI  Xyzal 2.5 mg (half tablet) every three to four days due to reduced renal function. Ask kidney doctor about coricidin but I do not see renal dosing for this.   2. Asthma due to environmental allergies  3. CKD Stage IV   Follow up plan: Return if symptoms worsen or  fail to improve.  Carles Collet, PA-C [facility] 10/27/2017, 10:03 AM

## 2017-10-27 NOTE — Patient Instructions (Signed)
xyzal 2.5 mg every 3-4 days Ask kidney dr about coricidin

## 2017-11-02 ENCOUNTER — Telehealth: Payer: Self-pay | Admitting: Family Medicine

## 2017-11-02 NOTE — Telephone Encounter (Signed)
Added to allergy list. FYI.

## 2017-11-02 NOTE — Telephone Encounter (Signed)
Copied from [place] [id number]. Topic: Quick Communication - See Telephone Encounter >> Nov 02, 2017  9:35 AM Bea Graff, NT wrote: CRM for notification. See Telephone encounter for: 11/02/17. Pt states that after his appt on 10/27/17 he took the xyzal one time and broke out in a rash. He states he would like this added to his chart that he is allergic to the medication. He states he is ok now, no symptoms, but wanted his PCP to be aware.

## 2017-11-08 DIAGNOSIS — M1712 Unilateral primary osteoarthritis, left knee: Secondary | ICD-10-CM | POA: Diagnosis not present

## 2017-11-14 ENCOUNTER — Ambulatory Visit (INDEPENDENT_AMBULATORY_CARE_PROVIDER_SITE_OTHER): Payer: Medicare Other

## 2017-11-14 VITALS — BP 132/70 | HR 53 | Temp 98.5°F | Resp 16 | Ht 66.5 in | Wt 201.9 lb

## 2017-11-14 DIAGNOSIS — Z1159 Encounter for screening for other viral diseases: Secondary | ICD-10-CM | POA: Diagnosis not present

## 2017-11-14 DIAGNOSIS — Z0000 Encounter for general adult medical examination without abnormal findings: Secondary | ICD-10-CM | POA: Diagnosis not present

## 2017-11-14 NOTE — Patient Instructions (Addendum)
[name] , Thank you for taking time to come for your Medicare Wellness Visit. I appreciate your ongoing commitment to your health goals. Please review the following plan we discussed and let me know if I can assist you in the future.   Screening recommendations/referrals: Colonoscopy: completed 12/23/2014 Recommended yearly ophthalmology/optometry visit for glaucoma screening and checkup Recommended yearly dental visit for hygiene and checkup  Vaccinations: Influenza vaccine: due 11/2017 Pneumococcal vaccine: up to date Tdap vaccine: up to date Shingles vaccine: shingrix eligible, check with your insurance company for coverage     Advanced directives: copy on file   Conditions/risks identified: Recommend continue drinking at least 6-8 glasses of water a day   Next appointment: Follow up in one year for your annual wellness exam.   Preventive Care 65 Years and Older, Male Preventive care refers to lifestyle choices and visits with your health care provider that can promote health and wellness. What does preventive care include?  A yearly physical exam. This is also called an annual well check.  Dental exams once or twice a year.  Routine eye exams. Ask your health care provider how often you should have your eyes checked.  Personal lifestyle choices, including:  Daily care of your teeth and gums.  Regular physical activity.  Eating a healthy diet.  Avoiding tobacco and drug use.  Limiting alcohol use.  Practicing safe sex.  Taking low doses of aspirin every day.  Taking vitamin and mineral supplements as recommended by your health care provider. What happens during an annual well check? The services and screenings done by your health care provider during your annual well check will depend on your age, overall health, lifestyle risk factors, and family history of disease. Counseling  Your health care provider may ask you questions about your:  Alcohol use.  Tobacco  use.  Drug use.  Emotional well-being.  Home and relationship well-being.  Sexual activity.  Eating habits.  History of falls.  Memory and ability to understand (cognition).  Work and work Statistician. Screening  You may have the following tests or measurements:  Height, weight, and BMI.  Blood pressure.  Lipid and cholesterol levels. These may be checked every 5 years, or more frequently if you are over 68 years old.  Skin check.  Lung cancer screening. You may have this screening every year starting at age 70 if you have a 30-pack-year history of smoking and currently smoke or have quit within the past 15 years.  Fecal occult blood test (FOBT) of the stool. You may have this test every year starting at age 4.  Flexible sigmoidoscopy or colonoscopy. You may have a sigmoidoscopy every 5 years or a colonoscopy every 10 years starting at age 35.  Prostate cancer screening. Recommendations will vary depending on your family history and other risks.  Hepatitis C blood test.  Hepatitis B blood test.  Sexually transmitted disease (STD) testing.  Diabetes screening. This is done by checking your blood sugar (glucose) after you have not eaten for a while (fasting). You may have this done every 1-3 years.  Abdominal aortic aneurysm (AAA) screening. You may need this if you are a current or former smoker.  Osteoporosis. You may be screened starting at age 26 if you are at high risk. Talk with your health care provider about your test results, treatment options, and if necessary, the need for more tests. Vaccines  Your health care provider may recommend certain vaccines, such as:  Influenza vaccine. This is  recommended every year.  Tetanus, diphtheria, and acellular pertussis (Tdap, Td) vaccine. You may need a Td booster every 10 years.  Zoster vaccine. You may need this after age 38.  Pneumococcal 13-valent conjugate (PCV13) vaccine. One dose is recommended after age  62.  Pneumococcal polysaccharide (PPSV23) vaccine. One dose is recommended after age 50. Talk to your health care provider about which screenings and vaccines you need and how often you need them. This information is not intended to replace advice given to you by your health care provider. Make sure you discuss any questions you have with your health care provider. Document Released: 04/04/2015 Document Revised: 11/26/2015 Document Reviewed: 01/07/2015 Elsevier Interactive Patient Education  2017 [place] Prevention in the Home Falls can cause injuries. They can happen to people of all ages. There are many things you can do to make your home safe and to help prevent falls. What can I do on the outside of my home?  Regularly fix the edges of walkways and driveways and fix any cracks.  Remove anything that might make you trip as you walk through a door, such as a raised step or threshold.  Trim any bushes or trees on the path to your home.  Use bright outdoor lighting.  Clear any walking paths of anything that might make someone trip, such as rocks or tools.  Regularly check to see if handrails are loose or broken. Make sure that both sides of any steps have handrails.  Any raised decks and porches should have guardrails on the edges.  Have any leaves, snow, or ice cleared regularly.  Use sand or salt on walking paths during winter.  Clean up any spills in your garage right away. This includes oil or grease spills. What can I do in the bathroom?  Use night lights.  Install grab bars by the toilet and in the tub and shower. Do not use towel bars as grab bars.  Use non-skid mats or decals in the tub or shower.  If you need to sit down in the shower, use a plastic, non-slip stool.  Keep the floor dry. Clean up any water that spills on the floor as soon as it happens.  Remove soap buildup in the tub or shower regularly.  Attach bath mats securely with double-sided  non-slip rug tape.  Do not have throw rugs and other things on the floor that can make you trip. What can I do in the bedroom?  Use night lights.  Make sure that you have a light by your bed that is easy to reach.  Do not use any sheets or blankets that are too big for your bed. They should not hang down onto the floor.  Have a firm chair that has side arms. You can use this for support while you get dressed.  Do not have throw rugs and other things on the floor that can make you trip. What can I do in the kitchen?  Clean up any spills right away.  Avoid walking on wet floors.  Keep items that you use a lot in easy-to-reach places.  If you need to reach something above you, use a strong step stool that has a grab bar.  Keep electrical cords out of the way.  Do not use floor polish or wax that makes floors slippery. If you must use wax, use non-skid floor wax.  Do not have throw rugs and other things on the floor that can make you trip.  What can I do with my stairs?  Do not leave any items on the stairs.  Make sure that there are handrails on both sides of the stairs and use them. Fix handrails that are broken or loose. Make sure that handrails are as long as the stairways.  Check any carpeting to make sure that it is firmly attached to the stairs. Fix any carpet that is loose or worn.  Avoid having throw rugs at the top or bottom of the stairs. If you do have throw rugs, attach them to the floor with carpet tape.  Make sure that you have a light switch at the top of the stairs and the bottom of the stairs. If you do not have them, ask someone to add them for you. What else can I do to help prevent falls?  Wear shoes that:  Do not have high heels.  Have rubber bottoms.  Are comfortable and fit you well.  Are closed at the toe. Do not wear sandals.  If you use a stepladder:  Make sure that it is fully opened. Do not climb a closed stepladder.  Make sure that both  sides of the stepladder are locked into place.  Ask someone to hold it for you, if possible.  Clearly mark and make sure that you can see:  Any grab bars or handrails.  First and last steps.  Where the edge of each step is.  Use tools that help you move around (mobility aids) if they are needed. These include:  Canes.  Walkers.  Scooters.  Crutches.  Turn on the lights when you go into a dark area. Replace any light bulbs as soon as they burn out.  Set up your furniture so you have a clear path. Avoid moving your furniture around.  If any of your floors are uneven, fix them.  If there are any pets around you, be aware of where they are.  Review your medicines with your doctor. Some medicines can make you feel dizzy. This can increase your chance of falling. Ask your doctor what other things that you can do to help prevent falls. This information is not intended to replace advice given to you by your health care provider. Make sure you discuss any questions you have with your health care provider. Document Released: 01/02/2009 Document Revised: 08/14/2015 Document Reviewed: 04/12/2014 Elsevier Interactive Patient Education  2017 [employer].

## 2017-11-14 NOTE — Progress Notes (Signed)
Subjective:   Luis Miller. is a 72 y.o. male who presents for Medicare Annual/Subsequent preventive examination.  Review of Systems:   Cardiac Risk Factors include: male gender;hypertension;dyslipidemia;obesity (BMI >30kg/m2);advanced age (>30mn, >>60women);smoking/ tobacco exposure     Objective:    Vitals: BP 132/70 (BP Location: Left Arm, Patient Position: Sitting)   Pulse (!) 53   Temp 98.5 F (36.9 C) (Temporal)   Resp 16   Ht 5' 6.5" (1.689 m)   Wt 201 lb 14.4 oz (91.6 kg)   BMI 32.10 kg/m   Body mass index is 32.1 kg/m.  Advanced Directives 11/11/2016 08/25/2015 08/25/2015 08/25/2015 08/19/2015  Does Patient Have a Medical Advance Directive? Yes Yes Yes Yes Yes  Type of AParamedicof ACypressLiving will HDarlingLiving will HDuttonLiving will HGold HillLiving will HCane BedsLiving will  Does patient want to make changes to medical advance directive? - No - Patient declined - No - Patient declined No - Patient declined  Copy of [place]in Chart? Yes Yes Yes Yes Yes  Some encounter information is confidential and restricted. Go to Review Flowsheets activity to see all data.    Tobacco Social History   Tobacco Use  Smoking Status Former Smoker  . Types: Cigarettes  . Last attempt to quit: 09/08/1972  . Years since quitting: 45.2  Smokeless Tobacco Never Used     Counseling given: Not Answered   Clinical Intake:  Pre-visit preparation completed: Yes  Pain : 0-10 Pain Score: 4  Pain Type: Chronic pain Pain Location: Knee Pain Orientation: Left Pain Descriptors / Indicators: Aching Pain Onset: More than a month ago Pain Frequency: Constant     Nutritional Status: BMI > 30  Obese Nutritional Risks: None Diabetes: No  How often do you need to have someone help you when you read instructions, pamphlets, or other written materials  from your doctor or pharmacy?: 1 - Never What is the last grade level you completed in school?: high school   Interpreter Needed?: No  Information entered by :: Immanuel Fedak,LPN   Past Medical History:  Diagnosis Date  . Anemia   . Apnea, sleep   . Bipolar affective ([place]   . Cancer ([place] malignant melanoma  . Crohn's disease (HEl [place]   . Depression   . Diabetes insipidus ([place]   . Erectile dysfunction   . Hyperlipidemia   . Hypertension   . IBS (irritable bowel syndrome)   . Impotence   . Internal hemorrhoids   . 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[place]   . Paraphimosis   . Peyronie disease   . Secondary hyperparathyroidism of renal origin ([place]   . Skin cancer   . Testicular hypofunction   . Urinary frequency   . Urinary hesitancy    Past Surgical History:  Procedure Laterality Date  . arm fracture    . BONE MARROW BIOPSY    . COLONOSCOPY WITH PROPOFOL N/A 12/23/2014   Procedure: COLONOSCOPY WITH PROPOFOL;  Surgeon: RManya Silvas MD;  [facility];  Service: Endoscopy;  Laterality: N/A;  . MELANOMA EXCISION    . PENILE PROSTHESIS IMPLANT    . PENILE PROSTHESIS IMPLANT N/A 08/25/2015   Procedure: REPLACEMENT OF INFLATABLE PENILE PROSTHESIS COMPONENTS;  Surgeon: PCleon Gustin MD;  Location: [facility];  Service: Urology;  Laterality: N/A;  . SKIN CANCER EXCISION     nose  . TONSILLECTOMY    . VASECTOMY  Family History  Problem Relation Age of Onset  . Depression Sister   . Bipolar disorder Other   . Prostate cancer Neg Hx   . Kidney cancer Neg Hx   . Bladder Cancer Neg Hx    Social History   Socioeconomic History  . Marital status: Divorced    Spouse name: Not on file  . Number of children: 2  . Years of education: Not on file  . Highest education level: High school graduate  Occupational History  . Occupation: retired  Scientific laboratory technician  . Financial resource strain: Not hard at all  . Food insecurity:    Worry: Never true    Inability: Never  true  . Transportation needs:    Medical: No    Non-medical: No  Tobacco Use  . Smoking status: Former Smoker    Types: Cigarettes    Last attempt to quit: 09/08/1972    Years since quitting: 45.2  . Smokeless tobacco: Never Used  Substance and Sexual Activity  . Alcohol use: Not Currently    Alcohol/week: 0.0 standard drinks    Comment: Occasional glass of wine  . Drug use: No  . Sexual activity: Yes    Partners: Female    Birth control/protection: None  Lifestyle  . Physical activity:    Days per week: 1 day    Minutes per session: 120 min  . Stress: Not at all  Relationships  . Social connections:    Talks on phone: More than three times a week    Gets together: Twice a week    Attends religious service: More than 4 times per year    Active member of club or organization: Yes    Attends meetings of clubs or organizations: More than 4 times per year    Relationship status: Divorced  Other Topics Concern  . Not on file  Social History Narrative  . Not on file    Outpatient Encounter Medications as of 11/14/2017  Medication Sig  . allopurinol (ZYLOPRIM) 100 MG tablet Take 100 mg by mouth daily.   Marland Kitchen aMILoride (MIDAMOR) 5 MG tablet Take 1 tablet (5 mg total) by mouth daily.  Marland Kitchen amLODipine (NORVASC) 10 MG tablet Take 1 tablet (10 mg total) by mouth daily.  . benazepril (LOTENSIN) 40 MG tablet Take 40 mg by mouth daily.   Marland Kitchen FOLIC ACID PO Take by mouth.  . hydrALAZINE (APRESOLINE) 100 MG tablet Take 100 mg by mouth 3 (three) times daily.  Marland Kitchen lithium carbonate (LITHOBID) 300 MG CR tablet Take 1 tablet (300 mg total) by mouth at bedtime.  . Multiple Vitamin (MULTIVITAMIN) tablet Take 1 tablet by mouth daily.  . QUEtiapine (SEROQUEL) 25 MG tablet Take 1 tablet (25 mg total) by mouth at bedtime.  Marland Kitchen VITAMIN D, CHOLECALCIFEROL, PO Take 1 tablet by mouth daily.   . Colchicine 0.6 MG CAPS Take 0.3 mg by mouth every morning. Take 2 today (Patient not taking: Reported on 11/14/2017)  .  cyproheptadine (PERIACTIN) 4 MG tablet Take one tablet three hours prior to intercourse (Patient not taking: Reported on 11/14/2017)  . Multiple Vitamins-Minerals (CENTRUM SILVER) tablet Take by mouth.   No facility-administered encounter medications on file as of 11/14/2017.     Activities of Daily Living In your present state of health, do you have any difficulty performing the following activities: 11/14/2017  Hearing? Y  Comment hearing aids   Vision? N  Difficulty concentrating or making decisions? Y  Walking or climbing stairs? N  Dressing or bathing? N  Doing errands, shopping? N  Preparing Food and eating ? N  Using the Toilet? N  In the past six months, have you accidently leaked urine? N  Do you have problems with loss of bowel control? N  Managing your Medications? N  Managing your Finances? N  Housekeeping or managing your Housekeeping? N  Some recent data might be hidden    Patient Care Team: Guadalupe Maple, MD as PCP - General (Family Medicine) Anthonette Legato, MD (Internal Medicine) Nori Riis PA-C as Physician Assistant (Urology) Merril Abbe, MD as Referring Physician (Dermatology) Marry Guan, Laurice Record, MD (Orthopedic Surgery) Rainey Pines, MD as Consulting Physician (Psychiatry)   Assessment:   This is a routine wellness examination for Octavio.  Exercise Activities and Dietary recommendations Current Exercise Habits: The patient does not participate in regular exercise at present, Exercise limited by: None identified  Goals    . DIET - INCREASE WATER INTAKE     Recommend drinking at least 6-8 glasses of water a day        Fall Risk Fall Risk  11/14/2017 10/03/2017 04/05/2017 02/23/2017 11/11/2016  Falls in the past year? No No No No No  Comment - - - - -   Is the patient's home free of loose throw rugs in walkways, pet beds, electrical cords, etc?   yes      Grab bars in the bathroom? yes      Handrails on the stairs?   no stairs        Adequate lighting?   yes  Timed Get Up and Go Performed: Completed in 8 seconds with no use of assistive devices, steady gait. No intervention needed at this time.   Depression Screen PHQ 2/9 Scores 11/14/2017 04/05/2017 02/23/2017 11/11/2016  PHQ - 2 Score 0 0 0 0  PHQ- 9 Score - - - -    Cognitive Function MMSE - Mini Mental State Exam 02/23/2017  Orientation to time 5  Orientation to Place 5  Registration 3  Attention/ Calculation 4  Recall 2  Language- name 2 objects 2  Language- repeat 1  Language- follow 3 step command 3  Language- read & follow direction 1  Write a sentence 1  Copy design 1  Total score 28     6CIT Screen 11/14/2017 11/11/2016  What Year? 0 points 0 points  What month? 0 points 0 points  What time? 0 points 0 points  Count back from 20 0 points 0 points  Months in reverse 0 points 0 points  Repeat phrase 2 points 0 points  Total Score 2 0    Immunization History  Administered Date(s) Administered  . Influenza, High Dose Seasonal PF 01/02/2016, 01/12/2017  . Influenza,inj,Quad PF,6+ Mos 05/13/2015  . Influenza-Unspecified 02/27/2014  . Pneumococcal Conjugate-13 03/20/2014  . Pneumococcal-Unspecified 11/05/2004, 04/29/2009  . Td 08/12/2011  . Zoster 10/07/2009    Qualifies for Shingles Vaccine? Yes, discussed shingrix vaccine   Screening Tests Health Maintenance  Topic Date Due  . Hepatitis C Screening  04-Jun-1945  . INFLUENZA VACCINE  10/20/2017  . TETANUS/TDAP  08/11/2021  . COLONOSCOPY  12/22/2024   Cancer Screenings: Lung: Low Dose CT Chest recommended if Age 76-80 years, 30 pack-year currently smoking OR have quit w/in 15years. Patient does not qualify. Colorectal: completed 12/23/2014  Additional Screenings:  Hepatitis C Screening: due now, will order for future lab work       Plan:    I have personally  reviewed and addressed the Medicare Annual Wellness questionnaire and have noted the following in the patient's chart:   A. Medical and social history B. Use of alcohol, tobacco or illicit drugs  C. Current medications and supplements D. Functional ability and status E.  Nutritional status F.  Physical activity G. Advance directives H. List of other physicians I.  Hospitalizations, surgeries, and ER visits in previous 12 months J.  [place] such as hearing and vision if needed, cognitive and depression L. Referrals and appointments   In addition, I have reviewed and discussed with patient certain preventive protocols, quality metrics, and best practice recommendations. A written personalized care plan for preventive services as well as general preventive health recommendations were provided to patient.   Signed,  Tyler Aas, LPN Nurse Health Advisor   Nurse Notes:none

## 2017-11-23 ENCOUNTER — Telehealth: Payer: Self-pay

## 2017-11-23 NOTE — Telephone Encounter (Signed)
Patient requested call when high dose flu vaccines were available at office. Called patient and informed him office does currently have them and he can come anytime during business hours to receive the vaccine. Patient verbalized understanding.

## 2017-11-30 DIAGNOSIS — M1712 Unilateral primary osteoarthritis, left knee: Secondary | ICD-10-CM | POA: Diagnosis not present

## 2017-12-05 DIAGNOSIS — K649 Unspecified hemorrhoids: Secondary | ICD-10-CM | POA: Diagnosis not present

## 2017-12-05 DIAGNOSIS — Z86010 Personal history of colonic polyps: Secondary | ICD-10-CM | POA: Diagnosis not present

## 2017-12-05 DIAGNOSIS — Z860101 Personal history of adenomatous and serrated colon polyps: Secondary | ICD-10-CM | POA: Insufficient documentation

## 2017-12-05 DIAGNOSIS — K645 Perianal venous thrombosis: Secondary | ICD-10-CM | POA: Insufficient documentation

## 2017-12-12 DIAGNOSIS — K644 Residual hemorrhoidal skin tags: Secondary | ICD-10-CM | POA: Diagnosis not present

## 2017-12-12 DIAGNOSIS — K648 Other hemorrhoids: Secondary | ICD-10-CM | POA: Insufficient documentation

## 2017-12-19 ENCOUNTER — Other Ambulatory Visit: Payer: Self-pay | Admitting: Family Medicine

## 2017-12-19 DIAGNOSIS — R972 Elevated prostate specific antigen [PSA]: Secondary | ICD-10-CM

## 2017-12-20 ENCOUNTER — Other Ambulatory Visit: Payer: Medicare Other

## 2017-12-20 DIAGNOSIS — R972 Elevated prostate specific antigen [PSA]: Secondary | ICD-10-CM

## 2017-12-21 LAB — PSA: PROSTATE SPECIFIC AG, SERUM: 5.7 ng/mL — AB (ref 0.0–4.0)

## 2017-12-22 ENCOUNTER — Ambulatory Visit (INDEPENDENT_AMBULATORY_CARE_PROVIDER_SITE_OTHER): Payer: Medicare Other | Admitting: Urology

## 2017-12-22 ENCOUNTER — Ambulatory Visit (INDEPENDENT_AMBULATORY_CARE_PROVIDER_SITE_OTHER): Payer: Medicare Other

## 2017-12-22 ENCOUNTER — Encounter: Payer: Self-pay | Admitting: Urology

## 2017-12-22 VITALS — BP 137/74 | HR 60 | Ht 66.0 in | Wt 202.4 lb

## 2017-12-22 DIAGNOSIS — Z23 Encounter for immunization: Secondary | ICD-10-CM | POA: Diagnosis not present

## 2017-12-22 DIAGNOSIS — N138 Other obstructive and reflux uropathy: Secondary | ICD-10-CM

## 2017-12-22 DIAGNOSIS — N5319 Other ejaculatory dysfunction: Secondary | ICD-10-CM

## 2017-12-22 DIAGNOSIS — N529 Male erectile dysfunction, unspecified: Secondary | ICD-10-CM | POA: Diagnosis not present

## 2017-12-22 DIAGNOSIS — R972 Elevated prostate specific antigen [PSA]: Secondary | ICD-10-CM

## 2017-12-22 DIAGNOSIS — N401 Enlarged prostate with lower urinary tract symptoms: Secondary | ICD-10-CM | POA: Diagnosis not present

## 2017-12-22 NOTE — Progress Notes (Signed)
12/22/2017 11:11 AM   [name] 1945/07/28 [id number]  Referring provider: Guadalupe Maple, MD [address] [place], [place] [address]  Chief Complaint  Patient presents with  . Follow-up    HPI: 72 yo WM with an elevated PSA, ED and BPH with LU TS who presents today for a 6 month follow up.  Elevated PSA Patient had a negative bx in 2002 for a PSA of 4.03 ng/mL.  His current PSA is 5.7 ng/mL on 12/20/2017.  Marland Kitchen     Erectile dysfunction IPP placement for a malfunction IPP on 08/25/2015.  He is experiencing ejaculatory disorders.  He only ejaculates on occasion.  He is having orgasms.  He was recently started on cyproheptadine.  He reports that it is helping with orgasm.    BPH WITH LUTS His IPSS score today is 8, which is moderate lower urinary tract symptomatology. He is unhappy with his quality life due to his urinary symptoms.  His major complaints are frequency and nocturia.  His previous I PSS score was 8/3.  He denies any dysuria, hematuria or suprapubic pain.  He also denies any recent fevers, chills, nausea or vomiting.  He is adopted.   His nephrologist, Dr. Holley Raring, is having him drink 64 ounces of water daily.   IPSS    Row Name 12/22/17 1000         International Prostate Symptom Score   How often have you had the sensation of not emptying your bladder?  Not at All     How often have you had to urinate less than every two hours?  Almost always     How often have you found you stopped and started again several times when you urinated?  Not at All     How often have you found it difficult to postpone urination?  Less than 1 in 5 times     How often have you had a weak urinary stream?  Not at All     How often have you had to strain to start urination?  Not at All     How many times did you typically get up at night to urinate?  2 Times     Total IPSS Score  8       Quality of Life due to urinary symptoms   If you were to spend the rest of your life with  your urinary condition just the way it is now how would you feel about that?  Unhappy        Score:  1-7 Mild 8-19 Moderate 20-35 Severe   PMH: Past Medical History:  Diagnosis Date  . Anemia   . Apnea, sleep   . Bipolar affective ([place])   . Cancer ([place]) malignant melanoma  . Crohn's disease ([place])   . Depression   . Diabetes insipidus ([place])   . Erectile dysfunction   . Hyperlipidemia   . Hypertension   . IBS (irritable bowel syndrome)   . Impotence   . Internal hemorrhoids   . 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[place])   . Paraphimosis   . Peyronie disease   . Secondary hyperparathyroidism of renal origin ([place])   . Skin cancer   . Testicular hypofunction   . Urinary frequency   . Urinary hesitancy     Surgical History: Past Surgical History:  Procedure Laterality Date  . arm fracture    . BONE MARROW BIOPSY    . COLONOSCOPY WITH PROPOFOL N/A 12/23/2014  Procedure: COLONOSCOPY WITH PROPOFOL;  Surgeon: Manya Silvas, MD;  [facility];  Service: Endoscopy;  Laterality: N/A;  . MELANOMA EXCISION    . PENILE PROSTHESIS IMPLANT    . PENILE PROSTHESIS IMPLANT N/A 08/25/2015   Procedure: REPLACEMENT OF INFLATABLE PENILE PROSTHESIS COMPONENTS;  Surgeon: Cleon Gustin, MD;  Location: [facility];  Service: Urology;  Laterality: N/A;  . SKIN CANCER EXCISION     nose  . TONSILLECTOMY    . VASECTOMY      Home Medications:  Allergies as of 12/22/2017      Reactions   Mirtazapine Rash   Olanzapine Rash   Aspirin    Other reaction(s): Unknown Patient was instructed not to take aspirin but can not remember why   Atomoxetine Other (See Comments)   Urinary sx   Strattera [atomoxetine Hcl] Other (See Comments)   Urinary sx   Xyzal [levocetirizine Dihydrochloride] Rash   Patient reported on 11/02/17      Medication List        Accurate as of 12/22/17 11:11 AM. Always use your most recent med list.          allopurinol 100 MG tablet Commonly known as:   ZYLOPRIM Take 100 mg by mouth daily.   aMILoride 5 MG tablet Commonly known as:  MIDAMOR Take 1 tablet (5 mg total) by mouth daily.   amLODipine 10 MG tablet Commonly known as:  NORVASC Take 1 tablet (10 mg total) by mouth daily.   benazepril 40 MG tablet Commonly known as:  LOTENSIN Take 40 mg by mouth daily.   CENTRUM SILVER tablet Take by mouth.   Colchicine 0.6 MG Caps Take 0.3 mg by mouth every morning. Take 2 today   cyproheptadine 4 MG tablet Commonly known as:  PERIACTIN Take one tablet three hours prior to intercourse   FOLIC ACID PO Take by mouth.   hydrALAZINE 100 MG tablet Commonly known as:  APRESOLINE Take 100 mg by mouth 3 (three) times daily.   lithium carbonate 300 MG CR tablet Commonly known as:  LITHOBID Take 1 tablet (300 mg total) by mouth at bedtime.   multivitamin tablet Take 1 tablet by mouth daily.   QUEtiapine 25 MG tablet Commonly known as:  SEROQUEL Take 1 tablet (25 mg total) by mouth at bedtime.   VITAMIN D (CHOLECALCIFEROL) PO Take 1 tablet by mouth daily.       Allergies:  Allergies  Allergen Reactions  . Mirtazapine Rash  . Olanzapine Rash  . Aspirin     Other reaction(s): Unknown Patient was instructed not to take aspirin but can not remember why  . Atomoxetine Other (See Comments)    Urinary sx  . Strattera [Atomoxetine Hcl] Other (See Comments)    Urinary sx  . Xyzal [Levocetirizine Dihydrochloride] Rash    Patient reported on 11/02/17    Family History: Family History  Problem Relation Age of Onset  . Depression Sister   . Bipolar disorder Other   . Prostate cancer Neg Hx   . Kidney cancer Neg Hx   . Bladder Cancer Neg Hx     Social History:  reports that he quit smoking about 45 years ago. His smoking use included cigarettes. He has never used smokeless tobacco. He reports that he drank alcohol. He reports that he does not use drugs.  ROS: UROLOGY Frequent Urination?: Yes Hard to postpone urination?:  No Burning/pain with urination?: No Get up at night to urinate?: Yes Leakage of urine?:  No Urine stream starts and stops?: No Trouble starting stream?: No Do you have to strain to urinate?: No Blood in urine?: No Urinary tract infection?: No Sexually transmitted disease?: No Injury to kidneys or bladder?: Yes Painful intercourse?: No Weak stream?: No Erection problems?: No Penile pain?: No  Gastrointestinal Nausea?: No Vomiting?: No Indigestion/heartburn?: No Diarrhea?: No Constipation?: No  Constitutional Fever: No Night sweats?: No Weight loss?: No Fatigue?: No  Skin Skin rash/lesions?: No Itching?: No  Eyes Blurred vision?: No Double vision?: No  Ears/Nose/Throat Sore throat?: No Sinus problems?: No  Hematologic/Lymphatic Swollen glands?: No Easy bruising?: Yes  Cardiovascular Leg swelling?: Yes Chest pain?: No  Respiratory Cough?: No Shortness of breath?: No  Endocrine Excessive thirst?: Yes  Musculoskeletal Back pain?: No Joint pain?: Yes  Neurological Headaches?: No Dizziness?: No  Psychologic Depression?: No Anxiety?: No  Physical Exam: BP 137/74 (BP Location: Left Arm, Patient Position: Sitting, Cuff Size: Normal)   Pulse 60   Ht '5\' 6"'  (1.676 m)   Wt 202 lb 6.4 oz (91.8 kg)   BMI 32.67 kg/m   Constitutional: Well nourished. Alert and oriented, No acute distress. HEENT: [place] AT, moist mucus membranes. Trachea midline, no masses. Cardiovascular: No clubbing, cyanosis, or edema. Respiratory: Normal respiratory effort, no increased work of breathing. GI: Abdomen is soft, non tender, non distended, no abdominal masses. Liver and spleen not palpable.  No hernias appreciated.  Stool sample for occult testing is not indicated.   GU: No CVA tenderness.  No bladder fullness or masses.  Patient with uncircumcised phallus.  Foreskin easily retracted  Urethral meatus is patent.  No penile discharge. No penile lesions or rashes. Glans floppy.   Cylinders in place.  Scrotum without lesions, cysts, rashes and/or edema.  Testicles are located scrotally bilaterally. No masses are appreciated in the testicles. Left and right epididymis are normal.  Pump palpable high in the left hemiscrotum Rectal: Patient with  normal sphincter tone. Anus and perineum without scarring or rashes. No rectal masses are appreciated. Prostate is approximately 50 grams, could only palpate the apex and midportion of glands, no nodules are appreciated.  Skin: No rashes, bruises or suspicious lesions. Lymph: No cervical or inguinal adenopathy. Neurologic: Grossly intact, no focal deficits, moving all 4 extremities. Psychiatric: Normal mood and affect.    Laboratory Data: Lab Results  Component Value Date   WBC 10.1 04/05/2017   HGB 12.8 (L) 04/05/2017   HCT 36.4 (L) 04/05/2017   MCV 98 (H) 04/05/2017   PLT 198 04/05/2017    Lab Results  Component Value Date   CREATININE 3.15 (H) 10/03/2017    Lab Results  Component Value Date   TSH 0.213 (L) 04/05/2017       Component Value Date/Time   CHOL 180 10/03/2017 1101   HDL 43 04/05/2017 1154   CHOLHDL 4.7 04/05/2017 1154   VLDL 50 (H) 10/03/2017 1101   LDLCALC 111 (H) 04/05/2017 1154    Lab Results  Component Value Date   AST 21 10/03/2017   Lab Results  Component Value Date   ALT 20 10/03/2017   PSA History  5.6 ng/mL on 11/27/2013  4.9 ng/mL on 05/28/2014  5.7 ng/mL on 09/09/2014  5.5 ng/mL on 06/03/2015  6.4 ng/mL on 11/11/2015  6.4 ng/mL on 05/31/2016  5.6 ng/mL on 12/06/2016  7.1 ng/mL on 04/05/2017  6.4 ng/mL on 06/13/2017  5.7 ng/mL on 12/20/2017   I have reviewed the labs.  Assessment & Plan:    1. Elevated  PSA PSA currently is 5.7 RTC in 6 months for exam and PSA  2. Erectile dysfunction Satisfied with IPP RTC in 6 months for exam  3. Ejaculatory disorder Patient does not have a sexual partner at this time  4. BPH with LUTS IPSS score is 8/5, it is  worse Continue conservative management, avoiding bladder irritants and timed voiding's RTC in 6 months for IPSS, PSA and exam    Return in about 6 months (around 06/23/2018) for IPSS, PSA and exam.  These notes generated with voice recognition software. I apologize for typographical errors.  Zara Council, PA-C  [facility] [address] [place],  [address] [phone]

## 2017-12-26 ENCOUNTER — Ambulatory Visit (INDEPENDENT_AMBULATORY_CARE_PROVIDER_SITE_OTHER): Payer: Medicare Other | Admitting: Psychiatry

## 2017-12-26 ENCOUNTER — Encounter: Payer: Self-pay | Admitting: Psychiatry

## 2017-12-26 ENCOUNTER — Other Ambulatory Visit: Payer: Self-pay

## 2017-12-26 VITALS — BP 163/79 | HR 72 | Temp 97.9°F | Wt 204.4 lb

## 2017-12-26 DIAGNOSIS — N2581 Secondary hyperparathyroidism of renal origin: Secondary | ICD-10-CM | POA: Diagnosis not present

## 2017-12-26 DIAGNOSIS — I10 Essential (primary) hypertension: Secondary | ICD-10-CM | POA: Diagnosis not present

## 2017-12-26 DIAGNOSIS — D631 Anemia in chronic kidney disease: Secondary | ICD-10-CM | POA: Diagnosis not present

## 2017-12-26 DIAGNOSIS — N184 Chronic kidney disease, stage 4 (severe): Secondary | ICD-10-CM | POA: Diagnosis not present

## 2017-12-26 DIAGNOSIS — F3160 Bipolar disorder, current episode mixed, unspecified: Secondary | ICD-10-CM | POA: Diagnosis not present

## 2017-12-26 MED ORDER — ZALEPLON 5 MG PO CAPS: 5.0000 mg | ORAL_CAPSULE | Freq: Every evening | ORAL | 1 refills | Status: DC | PRN

## 2017-12-26 NOTE — Progress Notes (Signed)
Psychiatric MD Progress Note   Patient Identification: Luis Miller. MRN:  [id number] Date of Evaluation:  12/26/2017 Referral Source: Golden Pop, M.D Chief Complaint:    Visit Diagnosis:    ICD-10-CM   1. Bipolar I disorder, most recent episode mixed ([place]) F31.60     History of Present Illness:    Patient is a 72 year old divorced male with history of bipolar disorder presented for follow up. He appeared tired during the interview.  He reported that he was unable to sleep last night.  He stated that he again started having problems with the sleep and has been taking over-the-counter medications which are not helpful.  He stated that he is concerned about his insomnia.  He stated that he has to wake up several times during the night to go to the bathroom.  He was also concerned about recent diagnosis of hemorrhoids and will have his colonoscopy done next month.  Patient reported that he is also having trouble with his knee and is unable to dance on a regular basis.  He stated that he is having several issues and his kidney function is going down on a steady basis.  He has appointment with Dr. Holley Raring this afternoon and he will discuss about his kidney issues with him.  However I had a long discussion with his nephrologist Dr. Holley Raring last month and he is not concerned about his lithium intake and wants to continue his medication on a regular basis.     Patient reported that he is compliant with his medications.  He is agreeable to start taking sleep medication at this time as he is unable to sleep well at night.  He denied having any perceptual disturbances.  He denied having any suicidal homicidal ideations or plans.     He is receptive to his medication changes and remains compliant with his pills.        Patient has been stable and his tremors have decreased.      Associated Signs/Symptoms: Depression Symptoms:  fatigue, loss of energy/fatigue, (Hypo) Manic Symptoms:   denied Anxiety Symptoms:  Excessive Worry, Psychotic Symptoms:  none PTSD Symptoms: Negative NA  Past Psychiatric History:  Long history of bipolar disorder. He reported that he has seen Dr. Vallarie Mare for many years in the past. ECT Treatment - several  Psychiatric Admission x 4, [facility], [facility], [place], [place] No h/o Suicide attemtps.   Previous Psychotropic Medications:  Lithium Mellaril Seroquel Does not remember the names.   Allergies- Remeron Olanzapine Strattera   Substance Abuse History in the last 12 months:  No.  Consequences of Substance Abuse: Negative NA  Past Medical History:  Past Medical History:  Diagnosis Date  . Anemia   . Apnea, sleep   . Bipolar affective ([place])   . Cancer ([place]) malignant melanoma  . Crohn's disease ([place])   . Depression   . Diabetes insipidus ([place])   . Erectile dysfunction   . Hyperlipidemia   . Hypertension   . IBS (irritable bowel syndrome)   . Impotence   . Internal hemorrhoids   . 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[place])   . Paraphimosis   . Peyronie disease   . Secondary hyperparathyroidism of renal origin ([place])   . Skin cancer   . Testicular hypofunction   . Urinary frequency   . Urinary hesitancy     Past Surgical History:  Procedure Laterality Date  . arm fracture    . BONE MARROW BIOPSY    . COLONOSCOPY WITH  PROPOFOL N/A 12/23/2014   Procedure: COLONOSCOPY WITH PROPOFOL;  Surgeon: Manya Silvas, MD;  Location: [facility] ENDOSCOPY;  Service: Endoscopy;  Laterality: N/A;  . MELANOMA EXCISION    . PENILE PROSTHESIS IMPLANT    . PENILE PROSTHESIS IMPLANT N/A 08/25/2015   Procedure: REPLACEMENT OF INFLATABLE PENILE PROSTHESIS COMPONENTS;  Surgeon: Cleon Gustin, MD;  Location: [facility] ORS;  Service: Urology;  Laterality: N/A;  . SKIN CANCER EXCISION     nose  . TONSILLECTOMY    . VASECTOMY      Family Psychiatric History:  He reported bipolar disorder in his sister and nephew.  Family History:  Family History   Problem Relation Age of Onset  . Depression Sister   . Bipolar disorder Other   . Prostate cancer Neg Hx   . Kidney cancer Neg Hx   . Bladder Cancer Neg Hx     Social History:   Social History   Socioeconomic History  . Marital status: Divorced    Spouse name: Not on file  . Number of children: 2  . Years of education: Not on file  . Highest education level: High school graduate  Occupational History  . Occupation: retired  Scientific laboratory technician  . Financial resource strain: Not hard at all  . Food insecurity:    Worry: Never true    Inability: Never true  . Transportation needs:    Medical: No    Non-medical: No  Tobacco Use  . Smoking status: Former Smoker    Types: Cigarettes    Last attempt to quit: 09/08/1972    Years since quitting: 45.3  . Smokeless tobacco: Never Used  Substance and Sexual Activity  . Alcohol use: Not Currently    Alcohol/week: 0.0 standard drinks    Comment: Occasional glass of wine  . Drug use: No  . Sexual activity: Yes    Partners: Female    Birth control/protection: None  Lifestyle  . Physical activity:    Days per week: 1 day    Minutes per session: 120 min  . Stress: Not at all  Relationships  . Social connections:    Talks on phone: More than three times a week    Gets together: Twice a week    Attends religious service: More than 4 times per year    Active member of club or organization: Yes    Attends meetings of clubs or organizations: More than 4 times per year    Relationship status: Divorced  Other Topics Concern  . Not on file  Social History Narrative  . Not on file    Additional Social History:  Divorced- x 2 Lives with GF, she is 26 Retired- from [employer].    Allergies:   Allergies  Allergen Reactions  . Mirtazapine Rash  . Olanzapine Rash  . Aspirin     Other reaction(s): Unknown Patient was instructed not to take aspirin but can not remember why  . Atomoxetine Other (See Comments)    Urinary sx  . Strattera  [Atomoxetine Hcl] Other (See Comments)    Urinary sx  . Xyzal [Levocetirizine Dihydrochloride] Rash    Patient reported on 0/32/12    Metabolic Disorder Labs: No results found for: HGBA1C, MPG No results found for: PROLACTIN Lab Results  Component Value Date   CHOL 180 10/03/2017   TRIG 252 (H) 10/03/2017   HDL 43 04/05/2017   CHOLHDL 4.7 04/05/2017   VLDL 50 (H) 10/03/2017   LDLCALC 111 (H) 04/05/2017  [place] 95 06/07/2016     Current Medications: Current Outpatient Medications  Medication Sig Dispense Refill  . allopurinol (ZYLOPRIM) 100 MG tablet Take 100 mg by mouth daily.     Marland Kitchen aMILoride (MIDAMOR) 5 MG tablet Take 1 tablet (5 mg total) by mouth daily. 90 tablet 4  . amLODipine (NORVASC) 10 MG tablet Take 1 tablet (10 mg total) by mouth daily. 90 tablet 4  . benazepril (LOTENSIN) 40 MG tablet Take 40 mg by mouth daily.     . Colchicine 0.6 MG CAPS Take 0.3 mg by mouth every morning. Take 2 today (Patient not taking: Reported on 11/14/2017) 30 capsule 3  . cyproheptadine (PERIACTIN) 4 MG tablet Take one tablet three hours prior to intercourse (Patient not taking: Reported on 11/14/2017) 90 tablet 3  . FOLIC ACID PO Take by mouth.    . hydrALAZINE (APRESOLINE) 100 MG tablet Take 100 mg by mouth 3 (three) times daily.    Marland Kitchen lithium carbonate (LITHOBID) 300 MG CR tablet Take 1 tablet (300 mg total) by mouth at bedtime. 90 tablet 1  . Multiple Vitamin (MULTIVITAMIN) tablet Take 1 tablet by mouth daily.    . Multiple Vitamins-Minerals (CENTRUM SILVER) tablet Take by mouth.    . QUEtiapine (SEROQUEL) 25 MG tablet Take 1 tablet (25 mg total) by mouth at bedtime. 90 tablet 1  . VITAMIN D, CHOLECALCIFEROL, PO Take 1 tablet by mouth daily.      No current facility-administered medications for this visit.     Neurologic: Headache: No Seizure: No Paresthesias:No  Musculoskeletal: Strength & Muscle Tone: within normal limits Gait & Station: normal Patient leans:  N/A  Psychiatric Specialty Exam: Review of Systems  Constitutional: Positive for malaise/fatigue.  Gastrointestinal: Positive for constipation.  Genitourinary: Positive for frequency and urgency.  Musculoskeletal: Positive for joint pain.  Neurological: Positive for weakness.  Psychiatric/Behavioral: The patient has insomnia.     There were no vitals taken for this visit.There is no height or weight on file to calculate BMI.  General Appearance: Casual  Eye Contact:  Fair  Speech:  Clear and Coherent  Volume:  Normal  Mood:  Anxious  Affect:  Appropriate and Congruent  Thought Process:  Coherent  Orientation:  Full (Time, Place, and Person)  Thought Content:  Logical  Suicidal Thoughts:  No  Homicidal Thoughts:  No  Memory:  Immediate;   Fair Recent;   Fair Remote;   Fair  Judgement:  Fair  Insight:  Fair  Psychomotor Activity:  Psychomotor Retardation  Concentration:  Concentration: Fair and Attention Span: Fair  Recall:  [employer] of Knowledge:Fair  Language: Fair  Akathisia:  No  Handed:  Right  AIMS (if indicated):    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 Continue Seroquel 25 mg by mouth daily at bedtime  I will add Sonata 5 mg p.o. nightly as needed for insomnia.  Discussed with him about side effects of medications in detail and he agreed with the plan.       Patient will follow up in 1 months or earlier depending on his symptoms     More than 50% of the time spent in psychoeducation, counseling and coordination of care.    This note was generated in part or whole with voice recognition software. Voice regonition is usually  quite accurate but there are transcription errors that can and very often do occur. I apologize for any typographical errors that were not detected and  corrected.    Rainey Pines, MD 10/7/201910:08 AM

## 2017-12-27 ENCOUNTER — Telehealth: Payer: Self-pay

## 2017-12-27 NOTE — Telephone Encounter (Signed)
pt called states that the zaleplon is not covered by his insurance and that he needs something cheaper sent in.  pt uses [facility] in [place].  This is a dr. Gretel Acre pt.

## 2017-12-28 NOTE — Telephone Encounter (Signed)
I will defer this to Dr.Faheem . I am not familiar with this patient. Thanks .

## 2018-01-02 NOTE — Telephone Encounter (Signed)
I am sorry , I am not familiar with this patient. Pls ask him if he wants to be seen by Probation officer.

## 2018-01-02 NOTE — Telephone Encounter (Signed)
pt called states he still has not heard anything back from dr. Gretel Miller.

## 2018-01-05 DIAGNOSIS — M1712 Unilateral primary osteoarthritis, left knee: Secondary | ICD-10-CM | POA: Diagnosis not present

## 2018-01-09 ENCOUNTER — Telehealth: Payer: Self-pay

## 2018-01-09 NOTE — Telephone Encounter (Signed)
pt call states that he not sleeping so good.  wants you to call him about the sleep medication not covered under his plan.

## 2018-01-16 ENCOUNTER — Other Ambulatory Visit: Payer: Self-pay | Admitting: Psychiatry

## 2018-01-16 MED ORDER — TRAZODONE HCL 50 MG PO TABS: ORAL_TABLET | ORAL | 1 refills | Status: DC

## 2018-01-16 NOTE — Telephone Encounter (Signed)
Med refilled.

## 2018-01-18 DIAGNOSIS — Z09 Encounter for follow-up examination after completed treatment for conditions other than malignant neoplasm: Secondary | ICD-10-CM | POA: Diagnosis not present

## 2018-01-18 DIAGNOSIS — D225 Melanocytic nevi of trunk: Secondary | ICD-10-CM | POA: Diagnosis not present

## 2018-01-18 DIAGNOSIS — Z872 Personal history of diseases of the skin and subcutaneous tissue: Secondary | ICD-10-CM | POA: Diagnosis not present

## 2018-01-18 DIAGNOSIS — Z85828 Personal history of other malignant neoplasm of skin: Secondary | ICD-10-CM | POA: Diagnosis not present

## 2018-01-18 DIAGNOSIS — L821 Other seborrheic keratosis: Secondary | ICD-10-CM | POA: Diagnosis not present

## 2018-01-18 DIAGNOSIS — Z85820 Personal history of malignant melanoma of skin: Secondary | ICD-10-CM | POA: Diagnosis not present

## 2018-01-18 DIAGNOSIS — Z08 Encounter for follow-up examination after completed treatment for malignant neoplasm: Secondary | ICD-10-CM | POA: Diagnosis not present

## 2018-01-23 ENCOUNTER — Ambulatory Visit: Payer: Medicare Other | Admitting: Psychiatry

## 2018-01-31 DIAGNOSIS — Z9989 Dependence on other enabling machines and devices: Secondary | ICD-10-CM | POA: Diagnosis not present

## 2018-01-31 DIAGNOSIS — G4733 Obstructive sleep apnea (adult) (pediatric): Secondary | ICD-10-CM | POA: Diagnosis not present

## 2018-01-31 DIAGNOSIS — G251 Drug-induced tremor: Secondary | ICD-10-CM | POA: Diagnosis not present

## 2018-01-31 DIAGNOSIS — R413 Other amnesia: Secondary | ICD-10-CM | POA: Diagnosis not present

## 2018-01-31 NOTE — Discharge Instructions (Signed)
°  Instructions after Total Knee Replacement ° ° Luis Miller, Jr., M.D.    ° Dept. of Orthopaedics & Sports Medicine ° [facility] ° [address] ° [place], [place]  [address] ° Phone: [phone]   Fax: [phone] ° °  °DIET: °• Drink plenty of non-alcoholic fluids. °• Resume your normal diet. Include foods high in fiber. ° °ACTIVITY:  °• You may use crutches or a walker with weight-bearing as tolerated, unless instructed otherwise. °• You may be weaned off of the walker or crutches by your Physical Therapist.  °• Do NOT place pillows under the knee. Anything placed under the knee could limit your ability to straighten the knee.   °• Continue doing gentle exercises. Exercising will reduce the pain and swelling, increase motion, and prevent muscle weakness.   °• Please continue to use the TED compression stockings for 6 weeks. You may remove the stockings at night, but should reapply them in the morning. °• Do not drive or operate any equipment until instructed. ° °WOUND CARE:  °• Continue to use the PolarCare or ice packs periodically to reduce pain and swelling. °• You may bathe or shower after the staples are removed at the first office visit following surgery. ° °MEDICATIONS: °• You may resume your regular medications. °• Please take the pain medication as prescribed on the medication. °• Do not take pain medication on an empty stomach. °• You have been given a prescription for a blood thinner (Lovenox or Coumadin). Please take the medication as instructed. (NOTE: After completing a 2 week course of Lovenox, take one Enteric-coated aspirin once a day. This along with elevation will help reduce the possibility of phlebitis in your operated leg.) °• Do not drive or drink alcoholic beverages when taking pain medications. ° °CALL THE OFFICE FOR: °• Temperature above 101 degrees °• Excessive bleeding or drainage on the dressing. °• Excessive swelling, coldness, or paleness of the toes. °• Persistent  nausea and vomiting. ° °FOLLOW-UP:  °• You should have an appointment to return to the office in 10-14 days after surgery. °• Arrangements have been made for continuation of Physical Therapy (either home therapy or outpatient therapy). °  °

## 2018-02-05 MED ORDER — CEFAZOLIN SODIUM-DEXTROSE 2-4 GM/100ML-% IV SOLN: 2.0000 g | INTRAVENOUS | Status: DC

## 2018-02-05 MED ORDER — TRANEXAMIC ACID-NACL 1000-0.7 MG/100ML-% IV SOLN
1000.0000 mg | INTRAVENOUS | Status: DC
  Filled 2018-02-05: qty 100

## 2018-02-06 ENCOUNTER — Ambulatory Visit
Admission: RE | Admit: 2018-02-06 | Discharge: 2018-02-06 | Disposition: A | Discharge status: Home / Self Care | Payer: Medicare Other | Source: Ambulatory Visit | Attending: Unknown Physician Specialty | Admitting: Unknown Physician Specialty

## 2018-02-06 ENCOUNTER — Ambulatory Visit: Payer: Medicare Other | Admitting: Anesthesiology

## 2018-02-06 ENCOUNTER — Encounter
Admission: RE | Disposition: A | Discharge status: Home / Self Care | Payer: Self-pay | Source: Ambulatory Visit | Attending: Unknown Physician Specialty

## 2018-02-06 DIAGNOSIS — D125 Benign neoplasm of sigmoid colon: Secondary | ICD-10-CM | POA: Diagnosis not present

## 2018-02-06 DIAGNOSIS — F319 Bipolar disorder, unspecified: Secondary | ICD-10-CM | POA: Diagnosis not present

## 2018-02-06 DIAGNOSIS — E232 Diabetes insipidus: Secondary | ICD-10-CM | POA: Insufficient documentation

## 2018-02-06 DIAGNOSIS — K5790 Diverticulosis of intestine, part unspecified, without perforation or abscess without bleeding: Secondary | ICD-10-CM | POA: Diagnosis not present

## 2018-02-06 DIAGNOSIS — Z1211 Encounter for screening for malignant neoplasm of colon: Secondary | ICD-10-CM | POA: Insufficient documentation

## 2018-02-06 DIAGNOSIS — K5520 Angiodysplasia of colon without hemorrhage: Secondary | ICD-10-CM | POA: Diagnosis not present

## 2018-02-06 DIAGNOSIS — Z87891 Personal history of nicotine dependence: Secondary | ICD-10-CM | POA: Diagnosis not present

## 2018-02-06 DIAGNOSIS — I10 Essential (primary) hypertension: Secondary | ICD-10-CM | POA: Diagnosis not present

## 2018-02-06 DIAGNOSIS — Z86010 Personal history of colonic polyps: Secondary | ICD-10-CM | POA: Insufficient documentation

## 2018-02-06 DIAGNOSIS — K5791 Diverticulosis of intestine, part unspecified, without perforation or abscess with bleeding: Secondary | ICD-10-CM | POA: Diagnosis not present

## 2018-02-06 DIAGNOSIS — E785 Hyperlipidemia, unspecified: Secondary | ICD-10-CM | POA: Insufficient documentation

## 2018-02-06 DIAGNOSIS — K621 Rectal polyp: Secondary | ICD-10-CM | POA: Diagnosis not present

## 2018-02-06 DIAGNOSIS — I129 Hypertensive chronic kidney disease with stage 1 through stage 4 chronic kidney disease, or unspecified chronic kidney disease: Secondary | ICD-10-CM | POA: Diagnosis not present

## 2018-02-06 DIAGNOSIS — D123 Benign neoplasm of transverse colon: Secondary | ICD-10-CM | POA: Diagnosis not present

## 2018-02-06 DIAGNOSIS — Z6830 Body mass index (BMI) 30.0-30.9, adult: Secondary | ICD-10-CM | POA: Insufficient documentation

## 2018-02-06 DIAGNOSIS — D126 Benign neoplasm of colon, unspecified: Secondary | ICD-10-CM | POA: Diagnosis not present

## 2018-02-06 DIAGNOSIS — N183 Chronic kidney disease, stage 3 (moderate): Secondary | ICD-10-CM | POA: Diagnosis not present

## 2018-02-06 DIAGNOSIS — K5730 Diverticulosis of large intestine without perforation or abscess without bleeding: Secondary | ICD-10-CM | POA: Insufficient documentation

## 2018-02-06 DIAGNOSIS — E669 Obesity, unspecified: Secondary | ICD-10-CM | POA: Insufficient documentation

## 2018-02-06 DIAGNOSIS — G4730 Sleep apnea, unspecified: Secondary | ICD-10-CM | POA: Diagnosis not present

## 2018-02-06 DIAGNOSIS — K635 Polyp of colon: Secondary | ICD-10-CM | POA: Diagnosis not present

## 2018-02-06 DIAGNOSIS — K5090 Crohn's disease, unspecified, without complications: Secondary | ICD-10-CM | POA: Diagnosis not present

## 2018-02-06 HISTORY — PX: COLONOSCOPY WITH PROPOFOL: SHX5780

## 2018-02-06 SURGERY — COLONOSCOPY WITH PROPOFOL
Anesthesia: General

## 2018-02-06 MED ORDER — PROPOFOL 500 MG/50ML IV EMUL
INTRAVENOUS | Status: AC
  Filled 2018-02-06: qty 50

## 2018-02-06 MED ORDER — MIDAZOLAM HCL 5 MG/5ML IJ SOLN
INTRAMUSCULAR | Status: DC | PRN
  Administered 2018-02-06: 2 mg via INTRAVENOUS

## 2018-02-06 MED ORDER — SODIUM CHLORIDE 0.9 % IV SOLN
INTRAVENOUS | Status: DC
  Administered 2018-02-06: 14:00:00 via INTRAVENOUS

## 2018-02-06 MED ORDER — FENTANYL CITRATE (PF) 100 MCG/2ML IJ SOLN
INTRAMUSCULAR | Status: AC
  Filled 2018-02-06: qty 2

## 2018-02-06 MED ORDER — PROPOFOL 500 MG/50ML IV EMUL
INTRAVENOUS | Status: DC | PRN
  Administered 2018-02-06: 50 ug/kg/min via INTRAVENOUS

## 2018-02-06 MED ORDER — LIDOCAINE HCL (PF) 2 % IJ SOLN
INTRAMUSCULAR | Status: DC | PRN
  Administered 2018-02-06: 80 mg

## 2018-02-06 MED ORDER — MIDAZOLAM HCL 2 MG/2ML IJ SOLN
INTRAMUSCULAR | Status: AC
  Filled 2018-02-06: qty 2

## 2018-02-06 MED ORDER — LIDOCAINE HCL (PF) 2 % IJ SOLN
INTRAMUSCULAR | Status: AC
  Filled 2018-02-06: qty 10

## 2018-02-06 MED ORDER — PROPOFOL 10 MG/ML IV BOLUS
INTRAVENOUS | Status: DC | PRN
  Administered 2018-02-06 (×2): 20 mg via INTRAVENOUS
  Administered 2018-02-06: 30 mg via INTRAVENOUS

## 2018-02-06 MED ORDER — SODIUM CHLORIDE 0.9 % IV SOLN: INTRAVENOUS | Status: DC

## 2018-02-06 MED ORDER — FENTANYL CITRATE (PF) 100 MCG/2ML IJ SOLN
INTRAMUSCULAR | Status: DC | PRN
  Administered 2018-02-06 (×2): 50 ug via INTRAVENOUS

## 2018-02-06 NOTE — Transfer of Care (Signed)
Immediate Anesthesia Transfer of Care Note  Patient: Luis Miller.  Procedure(s) Performed: COLONOSCOPY WITH PROPOFOL (N/A )  Patient Location: PACU  Anesthesia Type:General  Level of Consciousness: sedated  Airway & Oxygen Therapy: Patient Spontanous Breathing and Patient connected to nasal cannula oxygen  Post-op Assessment: Report given to RN and Post -op Vital signs reviewed and stable  Post vital signs: Reviewed and stable  Last Vitals:  Vitals Value Taken Time  BP 111/58 02/06/2018  2:53 PM  Temp 36.2 C 02/06/2018  2:53 PM  Pulse 64 02/06/2018  2:53 PM  Resp 13 02/06/2018  2:53 PM  SpO2 97 % 02/06/2018  2:53 PM    Last Pain:  Vitals:   02/06/18 1453  TempSrc: Tympanic  PainSc: 0-No pain         Complications: No apparent anesthesia complications

## 2018-02-06 NOTE — Anesthesia Post-op Follow-up Note (Signed)
Anesthesia QCDR form completed.        

## 2018-02-06 NOTE — Op Note (Signed)
[facility] Gastroenterology Patient Name: Luis Miller Procedure Date: 02/06/2018 2:19 PM MRN: [id number] Account #: [id number] Date of Birth: Dec 03, 1945 Admit Type: Outpatient Age: 72 Room: [facility] ENDO ROOM 1 Gender: Male Note Status: Finalized Procedure:            Colonoscopy Indications:          High risk colon cancer surveillance: Personal history                        of colonic polyps Providers:            Manya Silvas, MD Referring MD:         Guadalupe Maple, MD (Referring MD) Medicines:            Propofol per Anesthesia Complications:        No immediate complications. Procedure:            Pre-Anesthesia Assessment:                       - After reviewing the risks and benefits, the patient                        was deemed in satisfactory condition to undergo the                        procedure.                       After obtaining informed consent, the colonoscope was                        passed under direct vision. Throughout the procedure,                        the patient's blood pressure, pulse, and oxygen                        saturations were monitored continuously. The                        Colonoscope was introduced through the anus and                        advanced to the the cecum, identified by appendiceal                        orifice and ileocecal valve. The colonoscopy was                        performed without difficulty. The patient tolerated the                        procedure well. The quality of the bowel preparation                        was good. Findings:      A medium polyp was found in the transverse colon. The polyp was sessile.       The polyp was removed with a hot snare. Resection and retrieval were       complete.      A small polyp was found  in the sigmoid colon. The polyp was sessile. The       polyp was removed with a hot snare. Resection and retrieval were       complete.      A very small  polyp was destroyed in the rectum.      Unusual area of superficial scattered blood vessels in descending colon,       possibly AVM's. No biopsies done. Impression:           - One medium polyp in the transverse colon, removed                        with a hot snare. Resected and retrieved.                       - One small polyp in the sigmoid colon, removed with a                        hot snare. Resected and retrieved. Recommendation:       - Await pathology results. Manya Silvas, MD 02/06/2018 2:56:36 PM This report has been signed electronically. Number of Addenda: 0 Note Initiated On: 02/06/2018 2:19 PM Scope Withdrawal Time: 0 hours 14 minutes 34 seconds  Total Procedure Duration: 0 hours 20 minutes 37 seconds       [facility]

## 2018-02-06 NOTE — H&P (Signed)
Primary Care Physician:  Guadalupe Maple, MD Primary Gastroenterologist:  Dr. Vira Agar  Pre-Procedure History & Physical: HPI:  Luis Bowens. is a 72 y.o. male is here for a colonoscopy to see how many polyps have grown colonoscopy.  Last colonoscopy was 3 years ago.   Past Medical History:  Diagnosis Date  . Anemia   . Apnea, sleep   . Bipolar affective ([place])   . Cancer ([place]) malignant melanoma  . Crohn's disease ([place])   . Depression   . Diabetes insipidus ([place])   . Erectile dysfunction   . Hyperlipidemia   . Hypertension   . IBS (irritable bowel syndrome)   . Impotence   . Internal hemorrhoids   . 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[place])   . Paraphimosis   . Peyronie disease   . Secondary hyperparathyroidism of renal origin ([place])   . Skin cancer   . Testicular hypofunction   . Urinary frequency   . Urinary hesitancy     Past Surgical History:  Procedure Laterality Date  . arm fracture    . BONE MARROW BIOPSY    . COLONOSCOPY WITH PROPOFOL N/A 12/23/2014   Procedure: COLONOSCOPY WITH PROPOFOL;  Surgeon: Manya Silvas, MD;  Location: [facility] ENDOSCOPY;  Service: Endoscopy;  Laterality: N/A;  . MELANOMA EXCISION    . PENILE PROSTHESIS IMPLANT    . PENILE PROSTHESIS IMPLANT N/A 08/25/2015   Procedure: REPLACEMENT OF INFLATABLE PENILE PROSTHESIS COMPONENTS;  Surgeon: Cleon Gustin, MD;  Location: [facility] ORS;  Service: Urology;  Laterality: N/A;  . SKIN CANCER EXCISION     nose  . TONSILLECTOMY    . VASECTOMY      Prior to Admission medications   Medication Sig Start Date End Date Taking? Authorizing Provider  allopurinol (ZYLOPRIM) 100 MG tablet Take 200 mg by mouth daily.  12/11/15  Yes [provider]  aMILoride (MIDAMOR) 5 MG tablet Take 1 tablet (5 mg total) by mouth daily. 04/05/17  Yes Crissman, Jeannette How, MD  amLODipine (NORVASC) 10 MG tablet Take 1 tablet (10 mg total) by mouth daily. 04/05/17  Yes Crissman, Jeannette How, MD  benazepril (LOTENSIN) 40 MG tablet  Take 40 mg by mouth daily.  08/27/14  Yes [provider]  cyproheptadine (PERIACTIN) 4 MG tablet Take one tablet three hours prior to intercourse 12/15/16  Yes McGowan, Larene Beach A, PA-C  hydrALAZINE (APRESOLINE) 100 MG tablet Take 100 mg by mouth 3 (three) times daily.   Yes [provider]  lithium carbonate (LITHOBID) 300 MG CR tablet Take 1 tablet (300 mg total) by mouth at bedtime. 10/24/17  Yes Rainey Pines, MD  Multiple Vitamin (MULTIVITAMIN) tablet Take 1 tablet by mouth daily.   Yes [provider]  OVER THE COUNTER MEDICATION Apply 1 application topically daily as needed (DMSO Cream for knee pain).   Yes [provider]  QUEtiapine (SEROQUEL) 25 MG tablet Take 1 tablet (25 mg total) by mouth at bedtime. 10/24/17  Yes Rainey Pines, MD  traZODone (DESYREL) 50 MG tablet Take 25- 97m po qhs prn Patient taking differently: Take 25-50 mg by mouth at bedtime as needed for sleep. Take 25- 55mpo qhs prn 01/16/18  Yes FaRainey PinesMD  Vitamin D, Cholecalciferol, 25 MCG (1000 UT) TABS Take 1 tablet by mouth daily.    Yes [provider]  calcitRIOL (ROCALTROL) 0.25 MCG capsule Take 0.25 mcg by mouth daily.    [provider]  Colchicine 0.6 MG CAPS Take 0.3  mg by mouth every morning. Take 2 today Patient taking differently: Take 0.3 mg by mouth 2 (two) times daily as needed (gout).  02/26/16   Crissman, Mark A, MD    Allergies as of 12/27/2017 - Review Complete 12/26/2017  Allergen Reaction Noted  . Mirtazapine Rash 09/09/2014  . Olanzapine Rash 09/09/2014  . Aspirin  05/10/2017  . Atomoxetine Other (See Comments) 10/17/2014  . Strattera [atomoxetine hcl] Other (See Comments) 07/12/2014  . Xyzal [levocetirizine dihydrochloride] Rash 11/02/2017    Family History  Problem Relation Age of Onset  . Depression Sister   . Bipolar disorder Other   . Prostate cancer Neg Hx   . Kidney cancer Neg Hx   . Bladder Cancer Neg Hx     Social History    Socioeconomic History  . Marital status: Divorced    Spouse name: Not on file  . Number of children: 2  . Years of education: Not on file  . Highest education level: High school graduate  Occupational History  . Occupation: retired  Social Needs  . Financial resource strain: Not hard at all  . Food insecurity:    Worry: Never true    Inability: Never true  . Transportation needs:    Medical: No    Non-medical: No  Tobacco Use  . Smoking status: Former Smoker    Types: Cigarettes    Last attempt to quit: 09/08/1972    Years since quitting: 45.4  . Smokeless tobacco: Never Used  Substance and Sexual Activity  . Alcohol use: Not Currently    Alcohol/week: 0.0 standard drinks    Comment: Occasional glass of wine  . Drug use: No  . Sexual activity: Yes    Partners: Female    Birth control/protection: None  Lifestyle  . Physical activity:    Days per week: 1 day    Minutes per session: 120 min  . Stress: Not at all  Relationships  . Social connections:    Talks on phone: More than three times a week    Gets together: Twice a week    Attends religious service: More than 4 times per year    Active member of club or organization: Yes    Attends meetings of clubs or organizations: More than 4 times per year    Relationship status: Divorced  . Intimate partner violence:    Fear of current or ex partner: No    Emotionally abused: No    Physically abused: No    Forced sexual activity: No  Other Topics Concern  . Not on file  Social History Narrative  . Not on file    Review of Systems: See HPI, otherwise negative ROS  Physical Exam: BP (!) 172/89   Pulse 72   Temp (!) 96.9 F (36.1 C) (Tympanic)   Resp 20   Ht 5' 8" (1.727 m)   Wt 90.7 kg   SpO2 100%   BMI 30.41 kg/m  General:   Alert,  pleasant and cooperative in NAD Head:  Normocephalic and atraumatic. Neck:  Supple; no masses or thyromegaly. Lungs:  Clear throughout to auscultation.    Heart:  Regular  rate and rhythm. Abdomen:  Soft, nontender and nondistended. Normal bowel sounds, without guarding, and without rebound.   Neurologic:  Alert and  oriented x4;  grossly normal neurologically.  Impression/Plan: Luis T Sames Jr. is here for an colonoscopy to be performed for removal of polyps.  Risks, benefits, limitations, and alternatives regarding  colonoscopy   have been reviewed with the patient.  Questions have been answered.  All parties agreeable.   Gaylyn Cheers, MD  02/06/2018, 2:20 PM

## 2018-02-06 NOTE — Anesthesia Preprocedure Evaluation (Signed)
Anesthesia Evaluation  Patient identified by MRN, date of birth, ID band Patient awake    Reviewed: Allergy & Precautions, NPO status , Patient's Chart, lab work & pertinent test results  History of Anesthesia Complications Negative for: history of anesthetic complications  Airway Mallampati: III  TM Distance: >3 FB Neck ROM: Full    Dental no notable dental hx.    Pulmonary sleep apnea and Continuous Positive Airway Pressure Ventilation , neg COPD, former smoker,    breath sounds clear to auscultation- rhonchi (-) wheezing      Cardiovascular hypertension, Pt. on medications (-) CAD, (-) Past MI, (-) Cardiac Stents and (-) CABG  Rhythm:Regular Rate:Normal - Systolic murmurs and - Diastolic murmurs    Neuro/Psych PSYCHIATRIC DISORDERS Depression Bipolar Disorder negative neurological ROS     GI/Hepatic Neg liver ROS, PUD,   Endo/Other  negative endocrine ROSneg diabetes  Renal/GU Renal InsufficiencyRenal disease     Musculoskeletal  (+) Arthritis ,   Abdominal (+) + obese,   Peds  Hematology  (+) anemia ,   Anesthesia Other Findings Past Medical History: No date: Anemia No date: Apnea, sleep No date: Bipolar affective (HCC) malignant melanoma: Cancer (HCC) No date: Crohn's disease ([place]) No date: Depression 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) No date: Erectile dysfunction No date: Hyperlipidemia No date: Hypertension No date: IBS (irritable bowel syndrome) No date: Impotence No date: Internal hemorrhoids No date: 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HCC) No date: Paraphimosis No date: Peyronie disease No date: Secondary hyperparathyroidism of renal origin ([place]) No date: Skin cancer 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 No date: Urinary frequency No date: Urinary hesitancy   Reproductive/Obstetrics                             Anesthesia Physical Anesthesia Plan  ASA: III  Anesthesia  Plan: General   Post-op Pain Management:    Induction: Intravenous  PONV Risk Score and Plan: 1 and Propofol infusion  Airway Management Planned: Natural Airway  Additional Equipment:   Intra-op Plan:   Post-operative Plan:   Informed Consent: I have reviewed the patients History and Physical, chart, labs and discussed the procedure including the risks, benefits and alternatives for the proposed anesthesia with the patient or authorized representative who has indicated his/her understanding and acceptance.   Dental advisory given  Plan Discussed with: CRNA and Anesthesiologist  Anesthesia Plan Comments:         Anesthesia Quick Evaluation

## 2018-02-07 NOTE — Anesthesia Postprocedure Evaluation (Signed)
Anesthesia Post Note  Patient: Luis Miller.  Procedure(s) Performed: COLONOSCOPY WITH PROPOFOL (N/A )  Patient location during evaluation: Endoscopy Anesthesia Type: General Level of consciousness: awake and alert and oriented Pain management: pain level controlled Vital Signs Assessment: post-procedure vital signs reviewed and stable Respiratory status: spontaneous breathing, nonlabored ventilation and respiratory function stable Cardiovascular status: blood pressure returned to baseline and stable Postop Assessment: no signs of nausea or vomiting Anesthetic complications: no     Last Vitals:  Vitals:   02/06/18 1513 02/06/18 1523  BP: (!) 150/85 (!) 149/81  Pulse: (!) 58 (!) 58  Resp: 15 14  Temp:    SpO2: 100% 99%    Last Pain:  Vitals:   02/07/18 0733  TempSrc:   PainSc: 0-No pain                 Zebulon Gantt

## 2018-02-08 ENCOUNTER — Other Ambulatory Visit: Payer: Self-pay

## 2018-02-08 ENCOUNTER — Encounter
Admission: RE | Admit: 2018-02-08 | Discharge: 2018-02-08 | Disposition: A | Discharge status: Home / Self Care | Payer: Medicare Other | Source: Ambulatory Visit | Attending: Orthopedic Surgery | Admitting: Orthopedic Surgery

## 2018-02-08 DIAGNOSIS — R9431 Abnormal electrocardiogram [ECG] [EKG]: Secondary | ICD-10-CM | POA: Insufficient documentation

## 2018-02-08 DIAGNOSIS — I10 Essential (primary) hypertension: Secondary | ICD-10-CM | POA: Diagnosis not present

## 2018-02-08 DIAGNOSIS — M1712 Unilateral primary osteoarthritis, left knee: Secondary | ICD-10-CM | POA: Diagnosis not present

## 2018-02-08 DIAGNOSIS — Z01818 Encounter for other preprocedural examination: Secondary | ICD-10-CM | POA: Diagnosis not present

## 2018-02-08 HISTORY — DX: Disorder of kidney and ureter, unspecified: N28.9

## 2018-02-08 HISTORY — DX: Chronic kidney disease, unspecified: N18.9

## 2018-02-08 HISTORY — DX: Unspecified osteoarthritis, unspecified site: M19.90

## 2018-02-08 HISTORY — DX: Gout, unspecified: M10.9

## 2018-02-08 LAB — CBC
HCT: 34.8 % — ABNORMAL LOW (ref 39.0–52.0)
Hemoglobin: 11.8 g/dL — ABNORMAL LOW (ref 13.0–17.0)
MCH: 34.4 pg — ABNORMAL HIGH (ref 26.0–34.0)
MCHC: 33.9 g/dL (ref 30.0–36.0)
MCV: 101.5 fL — ABNORMAL HIGH (ref 80.0–100.0)
NRBC: 0 % (ref 0.0–0.2)
PLATELETS: 150 10*3/uL (ref 150–400)
RBC: 3.43 MIL/uL — AB (ref 4.22–5.81)
RDW: 13.2 % (ref 11.5–15.5)
WBC: 3.9 10*3/uL — ABNORMAL LOW (ref 4.0–10.5)

## 2018-02-08 LAB — TYPE AND SCREEN
ABO/RH(D): A POS
ANTIBODY SCREEN: NEGATIVE

## 2018-02-08 LAB — COMPREHENSIVE METABOLIC PANEL
ALT: 20 U/L (ref 0–44)
AST: 23 U/L (ref 15–41)
Albumin: 4.2 g/dL (ref 3.5–5.0)
Alkaline Phosphatase: 53 U/L (ref 38–126)
Anion gap: 7 (ref 5–15)
BUN: 32 mg/dL — AB (ref 8–23)
CO2: 25 mmol/L (ref 22–32)
Calcium: 9.4 mg/dL (ref 8.9–10.3)
Chloride: 114 mmol/L — ABNORMAL HIGH (ref 98–111)
Creatinine, Ser: 2.9 mg/dL — ABNORMAL HIGH (ref 0.61–1.24)
GFR calc Af Amer: 23 mL/min — ABNORMAL LOW (ref >60)
GFR calc non Af Amer: 20 mL/min — ABNORMAL LOW (ref >60)
GLUCOSE: 98 mg/dL (ref 70–99)
POTASSIUM: 3.5 mmol/L (ref 3.5–5.1)
SODIUM: 146 mmol/L — AB (ref 135–145)
TOTAL PROTEIN: 7.3 g/dL (ref 6.5–8.1)
Total Bilirubin: 0.8 mg/dL (ref 0.3–1.2)

## 2018-02-08 LAB — URINALYSIS, ROUTINE W REFLEX MICROSCOPIC
BACTERIA UA: NONE SEEN
BILIRUBIN URINE: NEGATIVE
Glucose, UA: NEGATIVE mg/dL
HGB URINE DIPSTICK: NEGATIVE
Ketones, ur: NEGATIVE mg/dL
LEUKOCYTES UA: NEGATIVE
NITRITE: NEGATIVE
PH: 6 (ref 5.0–8.0)
Protein, ur: 30 mg/dL — AB
SPECIFIC GRAVITY, URINE: 1.005 (ref 1.005–1.030)
Squamous Epithelial / LPF: NONE SEEN (ref 0–5)

## 2018-02-08 LAB — PROTIME-INR
INR: 0.96
Prothrombin Time: 12.7 seconds (ref 11.4–15.2)

## 2018-02-08 LAB — SEDIMENTATION RATE: Sed Rate: 40 mm/hr — ABNORMAL HIGH (ref 0–20)

## 2018-02-08 LAB — SURGICAL PCR SCREEN
MRSA, PCR: NEGATIVE
STAPHYLOCOCCUS AUREUS: NEGATIVE

## 2018-02-08 LAB — SURGICAL PATHOLOGY

## 2018-02-08 LAB — C-REACTIVE PROTEIN: CRP: 1 mg/dL — ABNORMAL HIGH (ref <1.0)

## 2018-02-08 LAB — APTT: APTT: 31 s (ref 24–36)

## 2018-02-08 NOTE — Patient Instructions (Signed)
Your procedure is scheduled on: 02/22/18 Wed Report to Same Day Surgery 2nd floor medical mall [facility] Entrance-take elevator on left to 2nd floor.  Check in with surgery information desk.) To find out your arrival time please call [phone] between 1PM - 3PM on 02/21/18 Tues  Remember: Instructions that are not followed completely may result in serious medical risk, up to and including death, or upon the discretion of your surgeon and anesthesiologist your surgery may need to be rescheduled.    _x___ 1. Do not eat food after midnight the night before your procedure. You may drink clear liquids up to 2 hours before you are scheduled to arrive at the hospital for your procedure.  Do not drink clear liquids within 2 hours of your scheduled arrival to the hospital.  Clear liquids include  --Water or Apple juice without pulp  --Clear carbohydrate beverage such as ClearFast or Gatorade  --Black Coffee or Clear Tea (No milk, no creamers, do not add anything to                  the coffee or Tea Type 1 and type 2 diabetics should only drink water.   ____Ensure clear carbohydrate drink on the way to the hospital for bariatric patients  ____Ensure clear carbohydrate drink 3 hours before surgery for Dr Dwyane Luo patients if physician instructed.   No gum chewing or hard candies.     __x__ 2. No Alcohol for 24 hours before or after surgery.   __x__3. No Smoking or e-cigarettes for 24 prior to surgery.  Do not use any chewable tobacco products for at least 6 hour prior to surgery   ____  4. Bring all medications with you on the day of surgery if instructed.    __x__ 5. Notify your doctor if there is any change in your medical condition     (cold, fever, infections).    x___6. On the morning of surgery brush your teeth with toothpaste and water.  You may rinse your mouth with mouth wash if you wish.  Do not swallow any toothpaste or mouthwash.   Do not wear jewelry, make-up, hairpins,  clips or nail polish.  Do not wear lotions, powders, or perfumes. You may wear deodorant.  Do not shave 48 hours prior to surgery. Men may shave face and neck.  Do not bring valuables to the hospital.    [facility] is not responsible for any belongings or valuables.               Contacts, dentures or bridgework may not be worn into surgery.  Leave your suitcase in the car. After surgery it may be brought to your room.  For patients admitted to the hospital, discharge time is determined by your                       treatment team.  _  Patients discharged the day of surgery will not be allowed to drive home.  You will need someone to drive you home and stay with you the night of your procedure.    Please read over the following fact sheets that you were given:   [facility] Preparing for Surgery and or MRSA Information   _x___ Take anti-hypertensive listed below, cardiac, seizure, asthma,     anti-reflux and psychiatric medicines. These include:  1. amLODipine (NORVASC) 10 MG tablet  2.hydrALAZINE (APRESOLINE) 100 MG tablet  3.  4.  5.  6.  ____Fleets enema or Magnesium Citrate as directed.   _x___ Use CHG Soap or sage wipes as directed on instruction sheet   ____ Use inhalers on the day of surgery and bring to hospital day of surgery  ____ Stop Metformin and Janumet 2 days prior to surgery.    ____ Take 1/2 of usual insulin dose the night before surgery and none on the morning     surgery.   _x___ Follow recommendations from Cardiologist, Pulmonologist or PCP regarding          stopping Aspirin, Coumadin, Plavix ,Eliquis, Effient, or Pradaxa, and Pletal.  X____Stop Anti-inflammatories such as Advil, Aleve, Ibuprofen, Motrin, Naproxen, Naprosyn, Goodies powders or aspirin products. OK to take Tylenol and                          Celebrex.   _x___ Stop supplements until after surgery.  But may continue Vitamin D, Vitamin B,       and multivitamin.   _x___ Bring C-Pap to the  hospital.

## 2018-02-09 LAB — URINE CULTURE
Culture: NO GROWTH
SPECIAL REQUESTS: NORMAL

## 2018-02-09 NOTE — Pre-Procedure Instructions (Signed)
EKG OK BY DR Randa Lynn

## 2018-02-10 ENCOUNTER — Encounter: Payer: Self-pay | Admitting: Unknown Physician Specialty

## 2018-02-14 DIAGNOSIS — M1712 Unilateral primary osteoarthritis, left knee: Secondary | ICD-10-CM | POA: Diagnosis not present

## 2018-02-22 ENCOUNTER — Inpatient Hospital Stay: Payer: Medicare Other | Admitting: Anesthesiology

## 2018-02-22 ENCOUNTER — Other Ambulatory Visit: Payer: Self-pay | Admitting: Orthopedic Surgery

## 2018-02-22 ENCOUNTER — Inpatient Hospital Stay: Payer: Medicare Other

## 2018-02-22 ENCOUNTER — Inpatient Hospital Stay
Admission: RE | Admit: 2018-02-22 | Discharge: 2018-02-24 | DRG: 470 | Disposition: A | Discharge status: Home Health | Payer: Medicare Other | Attending: Orthopedic Surgery | Admitting: Orthopedic Surgery

## 2018-02-22 ENCOUNTER — Ambulatory Visit
Admission: RE | Admit: 2018-02-22 | Discharge: 2018-02-22 | Disposition: A | Discharge status: Home / Self Care | Payer: Self-pay | Source: Ambulatory Visit | Attending: Orthopedic Surgery | Admitting: Orthopedic Surgery

## 2018-02-22 ENCOUNTER — Encounter: Payer: Self-pay | Admitting: Orthopedic Surgery

## 2018-02-22 ENCOUNTER — Other Ambulatory Visit: Payer: Self-pay

## 2018-02-22 ENCOUNTER — Encounter
Admission: RE | Disposition: A | Discharge status: Home Health | Payer: Self-pay | Source: Home / Self Care | Attending: Orthopedic Surgery

## 2018-02-22 DIAGNOSIS — Z79899 Other long term (current) drug therapy: Secondary | ICD-10-CM | POA: Diagnosis not present

## 2018-02-22 DIAGNOSIS — Z471 Aftercare following joint replacement surgery: Secondary | ICD-10-CM | POA: Diagnosis not present

## 2018-02-22 DIAGNOSIS — M1710 Unilateral primary osteoarthritis, unspecified knee: Secondary | ICD-10-CM

## 2018-02-22 DIAGNOSIS — Z96651 Presence of right artificial knee joint: Secondary | ICD-10-CM | POA: Diagnosis not present

## 2018-02-22 DIAGNOSIS — I129 Hypertensive chronic kidney disease with stage 1 through stage 4 chronic kidney disease, or unspecified chronic kidney disease: Secondary | ICD-10-CM | POA: Diagnosis present

## 2018-02-22 DIAGNOSIS — E232 Diabetes insipidus: Secondary | ICD-10-CM | POA: Diagnosis present

## 2018-02-22 DIAGNOSIS — Z85820 Personal history of malignant melanoma of skin: Secondary | ICD-10-CM | POA: Diagnosis not present

## 2018-02-22 DIAGNOSIS — Z820 Family history of epilepsy and other diseases of the nervous system: Secondary | ICD-10-CM

## 2018-02-22 DIAGNOSIS — G4730 Sleep apnea, unspecified: Secondary | ICD-10-CM | POA: Diagnosis present

## 2018-02-22 DIAGNOSIS — N189 Chronic kidney disease, unspecified: Secondary | ICD-10-CM | POA: Diagnosis present

## 2018-02-22 DIAGNOSIS — K5090 Crohn's disease, unspecified, without complications: Secondary | ICD-10-CM | POA: Diagnosis present

## 2018-02-22 DIAGNOSIS — N183 Chronic kidney disease, stage 3 (moderate): Secondary | ICD-10-CM | POA: Diagnosis not present

## 2018-02-22 DIAGNOSIS — N529 Male erectile dysfunction, unspecified: Secondary | ICD-10-CM | POA: Diagnosis present

## 2018-02-22 DIAGNOSIS — Z888 Allergy status to other drugs, medicaments and biological substances status: Secondary | ICD-10-CM | POA: Diagnosis not present

## 2018-02-22 DIAGNOSIS — M1712 Unilateral primary osteoarthritis, left knee: Principal | ICD-10-CM | POA: Diagnosis present

## 2018-02-22 DIAGNOSIS — N2581 Secondary hyperparathyroidism of renal origin: Secondary | ICD-10-CM | POA: Diagnosis present

## 2018-02-22 DIAGNOSIS — Z886 Allergy status to analgesic agent status: Secondary | ICD-10-CM

## 2018-02-22 DIAGNOSIS — D631 Anemia in chronic kidney disease: Secondary | ICD-10-CM | POA: Diagnosis present

## 2018-02-22 DIAGNOSIS — Z86010 Personal history of colonic polyps: Secondary | ICD-10-CM

## 2018-02-22 DIAGNOSIS — Z96659 Presence of unspecified artificial knee joint: Secondary | ICD-10-CM

## 2018-02-22 DIAGNOSIS — E785 Hyperlipidemia, unspecified: Secondary | ICD-10-CM | POA: Diagnosis present

## 2018-02-22 HISTORY — PX: KNEE ARTHROPLASTY: SHX992

## 2018-02-22 LAB — ABO/RH: ABO/RH(D): A POS

## 2018-02-22 SURGERY — ARTHROPLASTY, KNEE, TOTAL, USING IMAGELESS COMPUTER-ASSISTED NAVIGATION
Anesthesia: Spinal | Laterality: Left

## 2018-02-22 MED ORDER — ONDANSETRON HCL 4 MG PO TABS: 4.0000 mg | ORAL_TABLET | Freq: Four times a day (QID) | ORAL | Status: DC | PRN

## 2018-02-22 MED ORDER — GABAPENTIN 300 MG PO CAPS
300.0000 mg | ORAL_CAPSULE | Freq: Once | ORAL | Status: AC
  Administered 2018-02-22: 300 mg via ORAL

## 2018-02-22 MED ORDER — CELECOXIB 200 MG PO CAPS
200.0000 mg | ORAL_CAPSULE | Freq: Two times a day (BID) | ORAL | Status: DC
  Administered 2018-02-22 – 2018-02-24 (×4): 200 mg via ORAL
  Filled 2018-02-22 (×4): qty 1

## 2018-02-22 MED ORDER — VITAMIN D 25 MCG (1000 UNIT) PO TABS
1000.0000 [IU] | ORAL_TABLET | Freq: Every day | ORAL | Status: DC
  Administered 2018-02-23: 1000 [IU] via ORAL
  Filled 2018-02-22: qty 1

## 2018-02-22 MED ORDER — CELECOXIB 200 MG PO CAPS
ORAL_CAPSULE | ORAL | Status: AC
  Administered 2018-02-22: 400 mg via ORAL
  Filled 2018-02-22: qty 2

## 2018-02-22 MED ORDER — MENTHOL 3 MG MT LOZG
1.0000 | LOZENGE | OROMUCOSAL | Status: DC | PRN
  Filled 2018-02-22: qty 9

## 2018-02-22 MED ORDER — SENNOSIDES-DOCUSATE SODIUM 8.6-50 MG PO TABS
1.0000 | ORAL_TABLET | Freq: Two times a day (BID) | ORAL | Status: DC
  Administered 2018-02-22 – 2018-02-24 (×4): 1 via ORAL
  Filled 2018-02-22 (×4): qty 1

## 2018-02-22 MED ORDER — FENTANYL CITRATE (PF) 100 MCG/2ML IJ SOLN
INTRAMUSCULAR | Status: DC | PRN
  Administered 2018-02-22: 100 ug via INTRAVENOUS

## 2018-02-22 MED ORDER — TRANEXAMIC ACID-NACL 1000-0.7 MG/100ML-% IV SOLN
1000.0000 mg | Freq: Once | INTRAVENOUS | Status: DC
  Filled 2018-02-22: qty 100

## 2018-02-22 MED ORDER — COLCHICINE 0.6 MG PO TABS
0.3000 mg | ORAL_TABLET | Freq: Every morning | ORAL | Status: DC
  Administered 2018-02-23 – 2018-02-24 (×2): 0.3 mg via ORAL
  Filled 2018-02-22 (×2): qty 0.5
  Filled 2018-02-22 (×2): qty 1

## 2018-02-22 MED ORDER — GABAPENTIN 300 MG PO CAPS
300.0000 mg | ORAL_CAPSULE | Freq: Every day | ORAL | Status: DC
  Administered 2018-02-22 – 2018-02-23 (×2): 300 mg via ORAL
  Filled 2018-02-22 (×2): qty 1

## 2018-02-22 MED ORDER — DIPHENHYDRAMINE HCL 12.5 MG/5ML PO ELIX: 12.5000 mg | ORAL_SOLUTION | ORAL | Status: DC | PRN

## 2018-02-22 MED ORDER — DEXAMETHASONE SODIUM PHOSPHATE 10 MG/ML IJ SOLN
8.0000 mg | Freq: Once | INTRAMUSCULAR | Status: AC
  Administered 2018-02-22: 8 mg via INTRAVENOUS

## 2018-02-22 MED ORDER — FAMOTIDINE 20 MG PO TABS
ORAL_TABLET | ORAL | Status: AC
  Administered 2018-02-22: 20 mg via ORAL
  Filled 2018-02-22: qty 1

## 2018-02-22 MED ORDER — CEFAZOLIN SODIUM-DEXTROSE 2-3 GM-%(50ML) IV SOLR
INTRAVENOUS | Status: DC | PRN
  Administered 2018-02-22: 2 g via INTRAVENOUS

## 2018-02-22 MED ORDER — PROPOFOL 500 MG/50ML IV EMUL
INTRAVENOUS | Status: DC | PRN
  Administered 2018-02-22: 50 ug/kg/min via INTRAVENOUS

## 2018-02-22 MED ORDER — GABAPENTIN 300 MG PO CAPS
ORAL_CAPSULE | ORAL | Status: AC
  Administered 2018-02-22: 300 mg via ORAL
  Filled 2018-02-22: qty 1

## 2018-02-22 MED ORDER — OXYCODONE HCL 5 MG PO TABS
5.0000 mg | ORAL_TABLET | ORAL | Status: DC | PRN
  Administered 2018-02-22 – 2018-02-24 (×5): 5 mg via ORAL
  Filled 2018-02-22 (×6): qty 1

## 2018-02-22 MED ORDER — METOCLOPRAMIDE HCL 10 MG PO TABS: 5.0000 mg | ORAL_TABLET | Freq: Three times a day (TID) | ORAL | Status: DC | PRN

## 2018-02-22 MED ORDER — ALUM & MAG HYDROXIDE-SIMETH 200-200-20 MG/5ML PO SUSP: 30.0000 mL | ORAL | Status: DC | PRN

## 2018-02-22 MED ORDER — ACETAMINOPHEN 10 MG/ML IV SOLN
INTRAVENOUS | Status: DC | PRN
  Administered 2018-02-22: 1000 mg via INTRAVENOUS

## 2018-02-22 MED ORDER — FENTANYL CITRATE (PF) 100 MCG/2ML IJ SOLN
25.0000 ug | INTRAMUSCULAR | Status: DC | PRN
  Administered 2018-02-22 (×5): 25 ug via INTRAVENOUS

## 2018-02-22 MED ORDER — PANTOPRAZOLE SODIUM 40 MG PO TBEC
40.0000 mg | DELAYED_RELEASE_TABLET | Freq: Two times a day (BID) | ORAL | Status: DC
  Administered 2018-02-22 – 2018-02-24 (×4): 40 mg via ORAL
  Filled 2018-02-22 (×4): qty 1

## 2018-02-22 MED ORDER — PROPOFOL 10 MG/ML IV BOLUS
INTRAVENOUS | Status: DC | PRN
  Administered 2018-02-22: 20 mg via INTRAVENOUS
  Administered 2018-02-22: 18 mg via INTRAVENOUS

## 2018-02-22 MED ORDER — MAGNESIUM HYDROXIDE 400 MG/5ML PO SUSP
30.0000 mL | Freq: Every day | ORAL | Status: DC
  Administered 2018-02-23: 30 mL via ORAL
  Filled 2018-02-22: qty 30

## 2018-02-22 MED ORDER — SODIUM CHLORIDE 0.9 % IV SOLN
INTRAVENOUS | Status: DC
  Administered 2018-02-22 – 2018-02-23 (×2): via INTRAVENOUS

## 2018-02-22 MED ORDER — BUPIVACAINE HCL (PF) 0.5 % IJ SOLN
INTRAMUSCULAR | Status: AC
  Filled 2018-02-22: qty 10

## 2018-02-22 MED ORDER — ALLOPURINOL 100 MG PO TABS
200.0000 mg | ORAL_TABLET | Freq: Every day | ORAL | Status: DC
  Administered 2018-02-22 – 2018-02-24 (×3): 200 mg via ORAL
  Filled 2018-02-22 (×3): qty 2

## 2018-02-22 MED ORDER — CEFAZOLIN SODIUM-DEXTROSE 2-4 GM/100ML-% IV SOLN
2.0000 g | Freq: Four times a day (QID) | INTRAVENOUS | Status: DC
  Administered 2018-02-22 – 2018-02-23 (×3): 2 g via INTRAVENOUS
  Filled 2018-02-22 (×4): qty 100

## 2018-02-22 MED ORDER — QUETIAPINE FUMARATE 25 MG PO TABS
25.0000 mg | ORAL_TABLET | Freq: Every day | ORAL | Status: DC
  Administered 2018-02-22 – 2018-02-23 (×2): 25 mg via ORAL
  Filled 2018-02-22 (×2): qty 1

## 2018-02-22 MED ORDER — TRANEXAMIC ACID-NACL 1000-0.7 MG/100ML-% IV SOLN
INTRAVENOUS | Status: DC | PRN
  Administered 2018-02-22: 1000 mg via INTRAVENOUS

## 2018-02-22 MED ORDER — TRAMADOL HCL 50 MG PO TABS: 50.0000 mg | ORAL_TABLET | ORAL | Status: DC | PRN

## 2018-02-22 MED ORDER — FLEET ENEMA 7-19 GM/118ML RE ENEM: 1.0000 | ENEMA | Freq: Once | RECTAL | Status: DC | PRN

## 2018-02-22 MED ORDER — ACETAMINOPHEN 10 MG/ML IV SOLN
1000.0000 mg | Freq: Four times a day (QID) | INTRAVENOUS | Status: AC
  Administered 2018-02-22 – 2018-02-23 (×3): 1000 mg via INTRAVENOUS
  Filled 2018-02-22 (×4): qty 100

## 2018-02-22 MED ORDER — LIDOCAINE HCL (PF) 2 % IJ SOLN
INTRAMUSCULAR | Status: AC
  Filled 2018-02-22: qty 10

## 2018-02-22 MED ORDER — ONDANSETRON HCL 4 MG/2ML IJ SOLN: 4.0000 mg | Freq: Once | INTRAMUSCULAR | Status: DC | PRN

## 2018-02-22 MED ORDER — FENTANYL CITRATE (PF) 100 MCG/2ML IJ SOLN
INTRAMUSCULAR | Status: AC
  Administered 2018-02-22: 25 ug via INTRAVENOUS
  Filled 2018-02-22: qty 2

## 2018-02-22 MED ORDER — METOCLOPRAMIDE HCL 5 MG/ML IJ SOLN: 5.0000 mg | Freq: Three times a day (TID) | INTRAMUSCULAR | Status: DC | PRN

## 2018-02-22 MED ORDER — PROPOFOL 500 MG/50ML IV EMUL
INTRAVENOUS | Status: AC
  Filled 2018-02-22: qty 50

## 2018-02-22 MED ORDER — OXYCODONE HCL 5 MG PO TABS: 10.0000 mg | ORAL_TABLET | ORAL | Status: DC | PRN

## 2018-02-22 MED ORDER — ACETAMINOPHEN 10 MG/ML IV SOLN
INTRAVENOUS | Status: AC
  Filled 2018-02-22: qty 100

## 2018-02-22 MED ORDER — CEFAZOLIN SODIUM-DEXTROSE 2-4 GM/100ML-% IV SOLN
2.0000 g | INTRAVENOUS | Status: DC
  Filled 2018-02-22: qty 100

## 2018-02-22 MED ORDER — CHLORHEXIDINE GLUCONATE 4 % EX LIQD: 60.0000 mL | Freq: Once | CUTANEOUS | Status: DC

## 2018-02-22 MED ORDER — TETRACAINE HCL 1 % IJ SOLN
INTRAMUSCULAR | Status: DC | PRN
  Administered 2018-02-22: 4 mg via INTRASPINAL

## 2018-02-22 MED ORDER — BUPIVACAINE HCL (PF) 0.25 % IJ SOLN
INTRAMUSCULAR | Status: DC | PRN
  Administered 2018-02-22: 60 mL

## 2018-02-22 MED ORDER — AMILORIDE HCL 5 MG PO TABS
5.0000 mg | ORAL_TABLET | Freq: Every day | ORAL | Status: DC
  Administered 2018-02-23 – 2018-02-24 (×2): 5 mg via ORAL
  Filled 2018-02-22 (×2): qty 1

## 2018-02-22 MED ORDER — TRANEXAMIC ACID 1000 MG/10ML IV SOLN
1000.0000 mg | Freq: Once | INTRAVENOUS | Status: AC
  Administered 2018-02-22: 1000 mg via INTRAVENOUS
  Filled 2018-02-22: qty 10

## 2018-02-22 MED ORDER — HYDRALAZINE HCL 50 MG PO TABS
100.0000 mg | ORAL_TABLET | Freq: Three times a day (TID) | ORAL | Status: DC
  Administered 2018-02-22 – 2018-02-24 (×4): 100 mg via ORAL
  Filled 2018-02-22 (×3): qty 2

## 2018-02-22 MED ORDER — CALCITRIOL 0.25 MCG PO CAPS
0.2500 ug | ORAL_CAPSULE | Freq: Every day | ORAL | Status: DC
  Administered 2018-02-22 – 2018-02-24 (×3): 0.25 ug via ORAL
  Filled 2018-02-22 (×3): qty 1

## 2018-02-22 MED ORDER — LITHIUM CARBONATE ER 300 MG PO TBCR
300.0000 mg | EXTENDED_RELEASE_TABLET | Freq: Every day | ORAL | Status: DC
  Administered 2018-02-22 – 2018-02-23 (×2): 300 mg via ORAL
  Filled 2018-02-22 (×3): qty 1

## 2018-02-22 MED ORDER — CEFAZOLIN SODIUM-DEXTROSE 2-4 GM/100ML-% IV SOLN
INTRAVENOUS | Status: AC
  Filled 2018-02-22: qty 100

## 2018-02-22 MED ORDER — ADULT MULTIVITAMIN W/MINERALS CH
1.0000 | ORAL_TABLET | Freq: Every day | ORAL | Status: DC
  Administered 2018-02-23 – 2018-02-24 (×2): 1 via ORAL
  Filled 2018-02-22 (×2): qty 1

## 2018-02-22 MED ORDER — PHENOL 1.4 % MT LIQD
1.0000 | OROMUCOSAL | Status: DC | PRN
  Filled 2018-02-22: qty 177

## 2018-02-22 MED ORDER — TRAZODONE HCL 50 MG PO TABS
25.0000 mg | ORAL_TABLET | Freq: Every evening | ORAL | Status: DC | PRN
  Filled 2018-02-22: qty 1

## 2018-02-22 MED ORDER — FAMOTIDINE 20 MG PO TABS
20.0000 mg | ORAL_TABLET | Freq: Once | ORAL | Status: AC
  Administered 2018-02-22: 20 mg via ORAL

## 2018-02-22 MED ORDER — NEOMYCIN-POLYMYXIN B GU 40-200000 IR SOLN
Status: DC | PRN
  Administered 2018-02-22: 14 mL

## 2018-02-22 MED ORDER — HYDROMORPHONE HCL 1 MG/ML IJ SOLN: 0.5000 mg | INTRAMUSCULAR | Status: DC | PRN

## 2018-02-22 MED ORDER — GLYCOPYRROLATE 0.2 MG/ML IJ SOLN
INTRAMUSCULAR | Status: AC
  Filled 2018-02-22: qty 1

## 2018-02-22 MED ORDER — BUPIVACAINE HCL (PF) 0.25 % IJ SOLN
INTRAMUSCULAR | Status: AC
  Filled 2018-02-22: qty 30

## 2018-02-22 MED ORDER — DEXAMETHASONE SODIUM PHOSPHATE 10 MG/ML IJ SOLN
INTRAMUSCULAR | Status: AC
  Administered 2018-02-22: 8 mg via INTRAVENOUS
  Filled 2018-02-22: qty 1

## 2018-02-22 MED ORDER — AMLODIPINE BESYLATE 10 MG PO TABS
10.0000 mg | ORAL_TABLET | Freq: Every day | ORAL | Status: DC
  Administered 2018-02-23 – 2018-02-24 (×2): 10 mg via ORAL
  Filled 2018-02-22 (×2): qty 1

## 2018-02-22 MED ORDER — BENAZEPRIL HCL 20 MG PO TABS
40.0000 mg | ORAL_TABLET | Freq: Every day | ORAL | Status: DC
  Administered 2018-02-22 – 2018-02-24 (×3): 40 mg via ORAL
  Filled 2018-02-22 (×3): qty 2

## 2018-02-22 MED ORDER — METOCLOPRAMIDE HCL 10 MG PO TABS
10.0000 mg | ORAL_TABLET | Freq: Three times a day (TID) | ORAL | Status: DC
  Administered 2018-02-22 – 2018-02-24 (×4): 10 mg via ORAL
  Filled 2018-02-22 (×4): qty 1

## 2018-02-22 MED ORDER — FENTANYL CITRATE (PF) 100 MCG/2ML IJ SOLN
INTRAMUSCULAR | Status: AC
  Filled 2018-02-22: qty 2

## 2018-02-22 MED ORDER — SODIUM CHLORIDE 0.9 % IV SOLN
INTRAVENOUS | Status: DC | PRN
  Administered 2018-02-22: 30 ug/min via INTRAVENOUS

## 2018-02-22 MED ORDER — BUPIVACAINE HCL (PF) 0.5 % IJ SOLN
INTRAMUSCULAR | Status: DC | PRN
  Administered 2018-02-22: 2.6 mL

## 2018-02-22 MED ORDER — BUPIVACAINE LIPOSOME 1.3 % IJ SUSP
INTRAMUSCULAR | Status: AC
  Filled 2018-02-22: qty 20

## 2018-02-22 MED ORDER — CELECOXIB 200 MG PO CAPS
400.0000 mg | ORAL_CAPSULE | Freq: Once | ORAL | Status: AC
  Administered 2018-02-22: 400 mg via ORAL

## 2018-02-22 MED ORDER — FERROUS SULFATE 325 (65 FE) MG PO TABS
325.0000 mg | ORAL_TABLET | Freq: Two times a day (BID) | ORAL | Status: DC
  Administered 2018-02-23 – 2018-02-24 (×2): 325 mg via ORAL
  Filled 2018-02-22 (×2): qty 1

## 2018-02-22 MED ORDER — BISACODYL 10 MG RE SUPP
10.0000 mg | Freq: Every day | RECTAL | Status: DC | PRN
  Administered 2018-02-23: 10 mg via RECTAL
  Filled 2018-02-22: qty 1

## 2018-02-22 MED ORDER — ACETAMINOPHEN 325 MG PO TABS: 325.0000 mg | ORAL_TABLET | Freq: Four times a day (QID) | ORAL | Status: DC | PRN

## 2018-02-22 MED ORDER — ONDANSETRON HCL 4 MG/2ML IJ SOLN: 4.0000 mg | Freq: Four times a day (QID) | INTRAMUSCULAR | Status: DC | PRN

## 2018-02-22 MED ORDER — ENOXAPARIN SODIUM 30 MG/0.3ML ~~LOC~~ SOLN
30.0000 mg | Freq: Two times a day (BID) | SUBCUTANEOUS | Status: DC
  Administered 2018-02-23: 30 mg via SUBCUTANEOUS
  Filled 2018-02-22: qty 0.3

## 2018-02-22 MED ORDER — SODIUM CHLORIDE (PF) 0.9 % IJ SOLN
INTRAMUSCULAR | Status: AC
  Filled 2018-02-22: qty 50

## 2018-02-22 MED ORDER — SODIUM CHLORIDE 0.9 % IV SOLN
INTRAVENOUS | Status: DC | PRN
  Administered 2018-02-22: 60 mL

## 2018-02-22 MED ORDER — LOPERAMIDE HCL 2 MG PO CAPS
2.0000 mg | ORAL_CAPSULE | ORAL | Status: DC | PRN
  Filled 2018-02-22: qty 1

## 2018-02-22 MED ORDER — LACTATED RINGERS IV SOLN
INTRAVENOUS | Status: DC
  Administered 2018-02-22 (×2): via INTRAVENOUS

## 2018-02-22 SURGICAL SUPPLY — 71 items
ATTUNE MED DOME PAT 38 KNEE (Knees) ×2 IMPLANT
ATTUNE MED DOME PAT 38MM KNEE (Knees) ×1 IMPLANT
ATTUNE PS FEM LT SZ 6 CEM KNEE (Femur) ×3 IMPLANT
ATTUNE PSRP INSR SZ6 5 KNEE (Insert) ×2 IMPLANT
ATTUNE PSRP INSR SZ6 5MM KNEE (Insert) ×1 IMPLANT
BASE TIBIAL ROT PLAT SZ 7 KNEE (Knees) ×1 IMPLANT
BATTERY INSTRU NAVIGATION (MISCELLANEOUS) ×12 IMPLANT
BLADE SAW 70X12.5 (BLADE) ×3 IMPLANT
BLADE SAW 90X13X1.19 OSCILLAT (BLADE) ×3 IMPLANT
BLADE SAW 90X25X1.19 OSCILLAT (BLADE) ×3 IMPLANT
CANISTER SUCT 1200ML W/VALVE (MISCELLANEOUS) ×3 IMPLANT
CANISTER SUCT 3000ML PPV (MISCELLANEOUS) ×6 IMPLANT
CEMENT HV SMART SET (Cement) ×6 IMPLANT
COOLER POLAR GLACIER W/PUMP (MISCELLANEOUS) ×3 IMPLANT
COVER WAND RF STERILE (DRAPES) ×3 IMPLANT
CUFF TOURN 24 STER (MISCELLANEOUS) IMPLANT
CUFF TOURN 30 STER DUAL PORT (MISCELLANEOUS) IMPLANT
DRAPE SHEET LG 3/4 BI-LAMINATE (DRAPES) ×3 IMPLANT
DRSG DERMACEA 8X12 NADH (GAUZE/BANDAGES/DRESSINGS) ×3 IMPLANT
DRSG OPSITE POSTOP 4X14 (GAUZE/BANDAGES/DRESSINGS) ×3 IMPLANT
DRSG TEGADERM 4X4.75 (GAUZE/BANDAGES/DRESSINGS) ×3 IMPLANT
DURAPREP 26ML APPLICATOR (WOUND CARE) ×6 IMPLANT
ELECT CAUTERY BLADE 6.4 (BLADE) ×3 IMPLANT
ELECT REM PT RETURN 9FT ADLT (ELECTROSURGICAL) ×3
ELECTRODE REM PT RTRN 9FT ADLT (ELECTROSURGICAL) ×1 IMPLANT
EX-PIN ORTHOLOCK NAV 4X150 (PIN) ×6 IMPLANT
GLOVE BIOGEL M STRL SZ7.5 (GLOVE) ×6 IMPLANT
GLOVE BIOGEL PI IND STRL 9 (GLOVE) ×1 IMPLANT
GLOVE BIOGEL PI INDICATOR 9 (GLOVE) ×2
GLOVE INDICATOR 8.0 STRL GRN (GLOVE) ×3 IMPLANT
GLOVE SURG SYN 9.0  PF PI (GLOVE) ×2
GLOVE SURG SYN 9.0 PF PI (GLOVE) ×1 IMPLANT
GOWN STRL REUS W/ TWL LRG LVL3 (GOWN DISPOSABLE) ×2 IMPLANT
GOWN STRL REUS W/TWL 2XL LVL3 (GOWN DISPOSABLE) ×3 IMPLANT
GOWN STRL REUS W/TWL LRG LVL3 (GOWN DISPOSABLE) ×4
HEMOVAC 400CC 10FR (MISCELLANEOUS) ×3 IMPLANT
HOLDER FOLEY CATH W/STRAP (MISCELLANEOUS) ×3 IMPLANT
HOOD PEEL AWAY FLYTE STAYCOOL (MISCELLANEOUS) ×6 IMPLANT
KIT TURNOVER KIT A (KITS) ×3 IMPLANT
KNIFE SCULPS 14X20 (INSTRUMENTS) ×3 IMPLANT
LABEL OR SOLS (LABEL) ×3 IMPLANT
NDL SAFETY ECLIPSE 18X1.5 (NEEDLE) ×1 IMPLANT
NEEDLE HYPO 18GX1.5 SHARP (NEEDLE) ×2
NEEDLE SPNL 20GX3.5 QUINCKE YW (NEEDLE) ×6 IMPLANT
NS IRRIG 500ML POUR BTL (IV SOLUTION) ×3 IMPLANT
PACK TOTAL KNEE (MISCELLANEOUS) ×3 IMPLANT
PAD WRAPON POLAR KNEE (MISCELLANEOUS) ×1 IMPLANT
PENCIL SMOKE ULTRAEVAC 22 CON (MISCELLANEOUS) ×3 IMPLANT
PIN DRILL QUICK PACK ×3 IMPLANT
PIN FIXATION 1/8DIA X 3INL (PIN) ×9 IMPLANT
PULSAVAC PLUS IRRIG FAN TIP (DISPOSABLE) ×3
SOL .9 NS 3000ML IRR  AL (IV SOLUTION) ×2
SOL .9 NS 3000ML IRR UROMATIC (IV SOLUTION) ×1 IMPLANT
SOL PREP PVP 2OZ (MISCELLANEOUS) ×3
SOLUTION PREP PVP 2OZ (MISCELLANEOUS) ×1 IMPLANT
SPONGE DRAIN TRACH 4X4 STRL 2S (GAUZE/BANDAGES/DRESSINGS) ×3 IMPLANT
STAPLER SKIN PROX 35W (STAPLE) ×3 IMPLANT
STRAP TIBIA SHORT (MISCELLANEOUS) ×3 IMPLANT
SUCTION FRAZIER HANDLE 10FR (MISCELLANEOUS) ×2
SUCTION TUBE FRAZIER 10FR DISP (MISCELLANEOUS) ×1 IMPLANT
SUT VIC AB 0 CT1 36 (SUTURE) ×3 IMPLANT
SUT VIC AB 1 CT1 36 (SUTURE) ×6 IMPLANT
SUT VIC AB 2-0 CT2 27 (SUTURE) ×3 IMPLANT
SYR 20CC LL (SYRINGE) ×3 IMPLANT
SYR 30ML LL (SYRINGE) ×6 IMPLANT
TIBIAL BASE ROT PLAT SZ 7 KNEE (Knees) ×3 IMPLANT
TIP FAN IRRIG PULSAVAC PLUS (DISPOSABLE) ×1 IMPLANT
TOWEL OR 17X26 4PK STRL BLUE (TOWEL DISPOSABLE) ×3 IMPLANT
TOWER CARTRIDGE SMART MIX (DISPOSABLE) ×3 IMPLANT
TRAY FOLEY MTR SLVR 16FR STAT (SET/KITS/TRAYS/PACK) ×3 IMPLANT
WRAPON POLAR PAD KNEE (MISCELLANEOUS) ×3

## 2018-02-22 NOTE — Anesthesia Post-op Follow-up Note (Signed)
Anesthesia QCDR form completed.        

## 2018-02-22 NOTE — Transfer of Care (Signed)
Immediate Anesthesia Transfer of Care Note  Patient: Luis Miller.  Procedure(s) Performed: COMPUTER ASSISTED TOTAL KNEE ARTHROPLASTY (Left )  Patient Location: PACU  Anesthesia Type:Spinal  Level of Consciousness: awake, alert  and oriented  Airway & Oxygen Therapy: Patient Spontanous Breathing and Patient connected to face mask oxygen  Post-op Assessment: Report given to RN and Post -op Vital signs reviewed and stable  Post vital signs: Reviewed and stable  Last Vitals:  Vitals Value Taken Time  BP 133/86 02/22/2018  6:12 PM  Temp    Pulse 60 02/22/2018  6:15 PM  Resp 16 02/22/2018  6:15 PM  SpO2 100 % 02/22/2018  6:15 PM  Vitals shown include unvalidated device data.  Last Pain:  Vitals:   02/22/18 1329  TempSrc: Oral  PainSc: 0-No pain         Complications: No apparent anesthesia complications

## 2018-02-22 NOTE — H&P (Signed)
The patient has been re-examined, and the chart reviewed, and there have been no interval changes to the documented history and physical.    The risks, benefits, and alternatives have been discussed at length. The patient expressed understanding of the risks benefits and agreed with plans for surgical intervention.  Luis Miller, Jr. M.D.    

## 2018-02-22 NOTE — Anesthesia Preprocedure Evaluation (Signed)
Anesthesia Evaluation  Patient identified by MRN, date of birth, ID band Patient awake    Reviewed: Allergy & Precautions, NPO status , Patient's Chart, lab work & pertinent test results  History of Anesthesia Complications Negative for: history of anesthetic complications  Airway Mallampati: III  TM Distance: >3 FB Neck ROM: Full    Dental no notable dental hx.    Pulmonary sleep apnea and Continuous Positive Airway Pressure Ventilation , neg COPD, former smoker,    breath sounds clear to auscultation- rhonchi (-) wheezing      Cardiovascular hypertension, Pt. on medications (-) CAD, (-) Past MI, (-) Cardiac Stents and (-) CABG  Rhythm:Regular Rate:Normal - Systolic murmurs and - Diastolic murmurs    Neuro/Psych PSYCHIATRIC DISORDERS Depression Bipolar Disorder negative neurological ROS     GI/Hepatic Neg liver ROS, PUD,   Endo/Other  negative endocrine ROSneg diabetes  Renal/GU Renal InsufficiencyRenal disease     Musculoskeletal  (+) Arthritis ,   Abdominal (+) + obese,   Peds  Hematology  (+) anemia ,   Anesthesia Other Findings Past Medical History: No date: Anemia No date: Apnea, sleep No date: Bipolar affective (HCC) malignant melanoma: Cancer (HCC) No date: Crohn's disease ([place]) No date: Depression 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) No date: Erectile dysfunction No date: Hyperlipidemia No date: Hypertension No date: IBS (irritable bowel syndrome) No date: Impotence No date: Internal hemorrhoids No date: 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HCC) No date: Paraphimosis No date: Peyronie disease No date: Secondary hyperparathyroidism of renal origin ([place]) No date: Skin cancer 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 No date: Urinary frequency No date: Urinary hesitancy   Reproductive/Obstetrics                             Anesthesia Physical  Anesthesia Plan  ASA: III  Anesthesia  Plan: Spinal   Post-op Pain Management:    Induction: Intravenous  PONV Risk Score and Plan: 1 and Propofol infusion  Airway Management Planned: Natural Airway and Nasal Cannula  Additional Equipment:   Intra-op Plan:   Post-operative Plan:   Informed Consent: I have reviewed the patients History and Physical, chart, labs and discussed the procedure including the risks, benefits and alternatives for the proposed anesthesia with the patient or authorized representative who has indicated his/her understanding and acceptance.   Dental advisory given  Plan Discussed with: CRNA and Anesthesiologist  Anesthesia Plan Comments:         Anesthesia Quick Evaluation

## 2018-02-22 NOTE — Anesthesia Procedure Notes (Signed)
Spinal  Patient location during procedure: OR Start time: 02/22/2018 2:32 PM End time: 02/22/2018 2:37 PM Staffing Resident/CRNA: , , CRNA Performed: resident/CRNA  Preanesthetic Checklist Completed: patient identified, site marked, surgical consent, pre-op evaluation, timeout performed, IV checked, risks and benefits discussed and monitors and equipment checked Spinal Block Patient position: sitting Prep: ChloraPrep Patient monitoring: heart rate, continuous pulse ox, blood pressure and cardiac monitor Approach: midline Location: L3-4 Injection technique: single-shot Needle Needle type: Introducer and Pencan  Needle gauge: 24 G Needle length: 9 cm Additional Notes Negative paresthesia. Negative blood return. Positive free-flowing CSF. Expiration date of kit checked and confirmed. Patient tolerated procedure well, without complications.       

## 2018-02-22 NOTE — Op Note (Signed)
OPERATIVE NOTE  DATE OF SURGERY:  02/22/2018  PATIENT NAME:  Luis Miller.   DOB: 14-Mar-1946  MRN: [id number]  PRE-OPERATIVE DIAGNOSIS: Degenerative arthrosis of the left knee, primary  POST-OPERATIVE DIAGNOSIS:  Same  PROCEDURE:  Left total knee arthroplasty using computer-assisted navigation  SURGEON:  Marciano Sequin. M.D.  ASSISTANT:  Vance Peper, PA (present and scrubbed throughout the case, critical for assistance with exposure, retraction, instrumentation, and closure)  ANESTHESIA: spinal  ESTIMATED BLOOD LOSS: 75 mL  FLUIDS REPLACED: 1800 mL of crystalloid  TOURNIQUET TIME: 88 minutes  DRAINS: 2 medium Hemovac drains  SOFT TISSUE RELEASES: Anterior cruciate ligament, posterior cruciate ligament, deep medial collateral ligament, patellofemoral ligament  IMPLANTS UTILIZED: DePuy Attune size 6 posterior stabilized femoral component (cemented), size 7 rotating platform tibial component (cemented), 38 mm medialized dome patella (cemented), and a 5 mm stabilized rotating platform polyethylene insert.  INDICATIONS FOR SURGERY: Luis Miller. is a 72 y.o. year old male with a long history of progressive knee pain. X-rays demonstrated severe degenerative changes in tricompartmental fashion. The patient had not seen any significant improvement despite conservative nonsurgical intervention. After discussion of the risks and benefits of surgical intervention, the patient expressed understanding of the risks benefits and agree with plans for total knee arthroplasty.   The risks, benefits, and alternatives were discussed at length including but not limited to the risks of infection, bleeding, nerve injury, stiffness, blood clots, the need for revision surgery, cardiopulmonary complications, among others, and they were willing to proceed.  PROCEDURE IN DETAIL: The patient was brought into the operating room and, after adequate spinal anesthesia was achieved, a tourniquet was  placed on the patient's upper thigh. The patient's knee and leg were cleaned and prepped with alcohol and DuraPrep and draped in the usual sterile fashion. A "timeout" was performed as per usual protocol. The lower extremity was exsanguinated using an Esmarch, and the tourniquet was inflated to 300 mmHg. An anterior longitudinal incision was made followed by a standard mid vastus approach. The deep fibers of the medial collateral ligament were elevated in a subperiosteal fashion off of the medial flare of the tibia so as to maintain a continuous soft tissue sleeve. The patella was subluxed laterally and the patellofemoral ligament was incised. Inspection of the knee demonstrated severe degenerative changes with full-thickness loss of articular cartilage. Osteophytes were debrided using a rongeur. Anterior and posterior cruciate ligaments were excised. Two 4.0 mm Schanz pins were inserted in the femur and into the tibia for attachment of the array of trackers used for computer-assisted navigation. Hip center was identified using a circumduction technique. Distal landmarks were mapped using the computer. The distal femur and proximal tibia were mapped using the computer. The distal femoral cutting guide was positioned using computer-assisted navigation so as to achieve a 5 distal valgus cut. The femur was sized and it was felt that a size 6 femoral component was appropriate. A size 6 femoral cutting guide was positioned and the anterior cut was performed and verified using the computer. This was followed by completion of the posterior and chamfer cuts. Femoral cutting guide for the central box was then positioned in the center box cut was performed.  Attention was then directed to the proximal tibia. Medial and lateral menisci were excised. The extramedullary tibial cutting guide was positioned using computer-assisted navigation so as to achieve a 0 varus-valgus alignment and 3 posterior slope. The cut was  performed and verified using the computer. The  proximal tibia was sized and it was felt that a size 7 tibial tray was appropriate. Tibial and femoral trials were inserted followed by insertion of a 5 mm polyethylene insert.  The knee was felt to be tight both in flexion and in extension.  The trial components were removed and the extra medullary tibial cutting guide was repositioned supposed to resect an additional 2 mm of bone from the proximal tibia.  The cut was performed and verified using the computer.  Trial components were reinserted.  This allowed for excellent mediolateral soft tissue balancing both in flexion and in full extension. Finally, the patella was cut and prepared so as to accommodate a 38 mm medialized dome patella. A patella trial was placed and the knee was placed through a range of motion with excellent patellar tracking appreciated. The femoral trial was removed after debridement of posterior osteophytes. The central post-hole for the tibial component was reamed followed by insertion of a keel punch. Tibial trials were then removed. Cut surfaces of bone were irrigated with copious amounts of normal saline with antibiotic solution using pulsatile lavage and then suctioned dry. Polymethylmethacrylate cement was prepared in the usual fashion using a vacuum mixer. Cement was applied to the cut surface of the proximal tibia as well as along the undersurface of a size 7 rotating platform tibial component. Tibial component was positioned and impacted into place. Excess cement was removed using [employer] fast streamer. Cement was then applied to the cut surfaces of the femur as well as along the posterior flanges of the size 6 femoral component. The femoral component was positioned and impacted into place. Excess cement was removed using [employer] fast streamer. A 5 mm polyethylene trial was inserted and the knee was brought into full extension with steady axial compression applied. Finally, cement was applied to  the backside of a 38 mm medialized dome patella and the patellar component was positioned and patellar clamp applied. Excess cement was removed using [employer] fast streamer. After adequate curing of the cement, the tourniquet was deflated after a total tourniquet time of 88 minutes. Hemostasis was achieved using electrocautery. The knee was irrigated with copious amounts of normal saline with antibiotic solution using pulsatile lavage and then suctioned dry. 20 mL of 1.3% Exparel and 60 mL of 0.25% Marcaine in 40 mL of normal saline was injected along the posterior capsule, medial and lateral gutters, and along the arthrotomy site. A 5 mm stabilized rotating platform polyethylene insert was inserted and the knee was placed through a range of motion with excellent mediolateral soft tissue balancing appreciated and excellent patellar tracking noted. 2 medium drains were placed in the wound bed and brought out through separate stab incisions. The medial parapatellar portion of the incision was reapproximated using interrupted sutures of #1 Vicryl. Subcutaneous tissue was approximated in layers using first #0 Vicryl followed #2-0 Vicryl. The skin was approximated with skin staples. A sterile dressing was applied.  The patient tolerated the procedure well and was transported to the recovery room in stable condition.    Mykaela Arena P. Holley Bouche., M.D.

## 2018-02-23 ENCOUNTER — Encounter: Payer: Self-pay | Admitting: Orthopedic Surgery

## 2018-02-23 MED ORDER — TRAMADOL HCL 50 MG PO TABS: 50.0000 mg | ORAL_TABLET | ORAL | 0 refills | Status: DC | PRN

## 2018-02-23 MED ORDER — ENOXAPARIN SODIUM 30 MG/0.3ML ~~LOC~~ SOLN
30.0000 mg | SUBCUTANEOUS | Status: DC
  Administered 2018-02-24: 30 mg via SUBCUTANEOUS
  Filled 2018-02-23: qty 0.3

## 2018-02-23 MED ORDER — ENOXAPARIN SODIUM 40 MG/0.4ML ~~LOC~~ SOLN: 40.0000 mg | SUBCUTANEOUS | 0 refills | Status: DC

## 2018-02-23 MED ORDER — OXYCODONE HCL 5 MG PO TABS: 5.0000 mg | ORAL_TABLET | ORAL | 0 refills | Status: DC | PRN

## 2018-02-23 NOTE — Discharge Summary (Signed)
Physician Discharge Summary  Patient ID: Luis Miller. MRN: [id number] DOB/AGE: Jul 12, 1945 72 y.o.  Admit date: 02/22/2018 Discharge date: 02/24/2018  Admission Diagnoses:  LEFT KNEE OSTEOARTHRITIS   Discharge Diagnoses: Patient Active Problem List   Diagnosis Date Noted  . 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 Hemorrhoids, internal 12/12/2017  . Hx of adenomatous colonic polyps 12/05/2017  . Thrombosed external hemorrhoid 12/05/2017  . Drug-induced tremor 05/08/2017  . Diabetes insipidus ([place]) 04/05/2017  . Secondary hyperparathyroidism of renal origin ([place]) 04/05/2017  . Advanced care planning/counseling discussion 04/05/2017  . Carotid bruit 04/05/2017  . Primary osteoarthritis of left knee 04/05/2017  . Bradycardia 11/08/2016  . Gout 02/26/2016  . Erectile dysfunction due to arterial insufficiency 07/08/2015  . Obesity 11/27/2014  . CKD (chronic kidney disease) stage 3, GFR 30-59 ml/min (HCC) 11/27/2014  . Acute gout due to renal impairment involving right foot 11/22/2014  . 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  . Hypertension 09/09/2014  . Depressed bipolar I disorder in remission ([facility]) 09/09/2014  . Impotence 09/09/2014  . Lithium use 10/18/2013  . Abnormal thyroid function test 10/01/2013  . Hypertrophy of prostate with urinary obstruction and other lower urinary tract symptoms (LUTS) 01/13/2012  . Elevated prostate specific antigen (PSA) 01/13/2012  . Benign prostatic hyperplasia with urinary obstruction 01/13/2012  . ED (erectile dysfunction) of organic origin 01/13/2012  . Nocturia 01/13/2012  . 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 Peyronie's disease 01/13/2012  . Increased frequency of urination 01/13/2012  . Urinary hesitancy 01/13/2012    Past Medical History:  Diagnosis Date  . Anemia   . Apnea, sleep   . Arthritis   . Bipolar affective ([place])   . Cancer ([place]) malignant melanoma  . CRI (chronic renal insufficiency)   . Crohn's disease ([place])    . Depression   . Diabetes insipidus ([place])   . Erectile dysfunction   . Gout   . Hyperlipidemia   . Hypertension   . IBS (irritable bowel syndrome)   . Impotence   . Internal hemorrhoids   . 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[place])   . Paraphimosis   . Peyronie disease   . Secondary hyperparathyroidism of renal origin ([place])   . Skin cancer   . Testicular hypofunction   . Urinary frequency   . Urinary hesitancy      Transfusion: No transfusions during this admission   Consultants (if any):   Discharged Condition: Improved  Hospital Course: Carvin Almas. is an 72 y.o. male who was admitted 02/22/2018 with a diagnosis of degenerative arthrosis left knee and went to the operating room on 02/22/2018 and underwent the above named procedures.    Surgeries:Procedure(s): COMPUTER ASSISTED TOTAL KNEE ARTHROPLASTY on 02/22/2018  PRE-OPERATIVE DIAGNOSIS: Degenerative arthrosis of the left knee, primary  POST-OPERATIVE DIAGNOSIS:  Same  PROCEDURE:  Left total knee arthroplasty using computer-assisted navigation  SURGEON:  Marciano Sequin. M.D.  ASSISTANT:  Vance Peper, PA (present and scrubbed throughout the case, critical for assistance with exposure, retraction, instrumentation, and closure)  ANESTHESIA: spinal  ESTIMATED BLOOD LOSS: 75 mL  FLUIDS REPLACED: 1800 mL of crystalloid  TOURNIQUET TIME: 88 minutes  DRAINS: 2 medium Hemovac drains  SOFT TISSUE RELEASES: Anterior cruciate ligament, posterior cruciate ligament, deep medial collateral ligament, patellofemoral ligament  IMPLANTS UTILIZED: DePuy Attune size 6 posterior stabilized femoral component (cemented), size 7 rotating platform tibial component (cemented), 38 mm medialized dome patella (cemented), and a 5 mm stabilized rotating platform polyethylene insert.  INDICATIONS FOR SURGERY: Luis Miller  Luis Miller. is a 72 y.o. year old male with a long history of progressive knee pain. X-rays demonstrated severe  degenerative changes in tricompartmental fashion. The patient had not seen any significant improvement despite conservative nonsurgical intervention. After discussion of the risks and benefits of surgical intervention, the patient expressed understanding of the risks benefits and agree with plans for total knee arthroplasty.   The risks, benefits, and alternatives were discussed at length including but not limited to the risks of infection, bleeding, nerve injury, stiffness, blood clots, the need for revision surgery, cardiopulmonary complications, among others, and they were willing to proceed. Patient tolerated the surgery well. No complications .Patient was taken to PACU where she was stabilized and then transferred to the orthopedic floor.  Patient started on Lovenox 30 mg q 12 hrs. Foot pumps applied bilaterally at 80 mm hgb. Heels elevated off bed with rolled towels. No evidence of DVT. Calves non tender. Negative [place]. Physical therapy started on day #1 for gait training and transfer with OT starting on  day #1 for ADL and assisted devices. Patient has done well with therapy. Ambulated greater than 200 feet upon being discharged.  Was able to ascend and descend 4 steps safely and independently  Patient's IV And Foley were discontinued on day #1 with Hemovac being discontinued on day #2. Dressing was changed on day 2 prior to patient being discharged   He was given perioperative antibiotics:  Anti-infectives (From admission, onward)   Start     Dose/Rate Route Frequency Ordered Stop   02/22/18 2100  ceFAZolin (ANCEF) IVPB 2g/100 mL premix     2 g 200 mL/hr over 30 Minutes Intravenous Every 6 hours 02/22/18 2015 02/23/18 2059   02/22/18 1315  ceFAZolin (ANCEF) IVPB 2g/100 mL premix  Status:  Discontinued    Note to Pharmacy:  [place]. DO NOT ADMINISTER ON THE UNIT!   2 g 200 mL/hr over 30 Minutes Intravenous To Surgery 02/22/18 1307 02/22/18 2012   02/22/18 1258   ceFAZolin (ANCEF) 2-4 GM/100ML-% IVPB    Note to Pharmacy:  Lyman Bishop   : cabinet override      02/22/18 1258 02/23/18 0114    .  He was fitted with AV 1 compression foot pump devices, instructed on heel pumps, early ambulation, and fitted with TED stockings bilaterally for DVT prophylaxis.  He benefited maximally from the hospital stay and there were no complications.    Recent vital signs:  Vitals:   02/22/18 2342 02/23/18 0433  BP: 137/69 126/65  Pulse: 61 67  Resp: 16 16  Temp: 98.2 F (36.8 C) 98.2 F (36.8 C)  SpO2: 98% 99%    Recent laboratory studies:  Lab Results  Component Value Date   HGB 11.8 (L) 02/08/2018   HGB 12.8 (L) 04/05/2017   HGB 12.9 (L) 06/07/2016   Lab Results  Component Value Date   WBC 3.9 (L) 02/08/2018   PLT 150 02/08/2018   Lab Results  Component Value Date   INR 0.96 02/08/2018   Lab Results  Component Value Date   NA 146 (H) 02/08/2018   K 3.5 02/08/2018   CL 114 (H) 02/08/2018   CO2 25 02/08/2018   BUN 32 (H) 02/08/2018   CREATININE 2.90 (H) 02/08/2018   GLUCOSE 98 02/08/2018    Discharge Medications:   Allergies as of 02/23/2018      Reactions   Mirtazapine Rash   Olanzapine Rash   Aspirin  Other reaction(s): Unknown Patient was instructed not to take aspirin but can not remember why   Atomoxetine Other (See Comments)   Urinary sx   Strattera [atomoxetine Hcl] Other (See Comments)   Urinary sx   Xyzal [levocetirizine Dihydrochloride] Rash   Patient reported on 11/02/17      Medication List    TAKE these medications   allopurinol 100 MG tablet Commonly known as:  ZYLOPRIM Take 200 mg by mouth daily.   aMILoride 5 MG tablet Commonly known as:  MIDAMOR Take 1 tablet (5 mg total) by mouth daily.   amLODipine 10 MG tablet Commonly known as:  NORVASC Take 1 tablet (10 mg total) by mouth daily.   benazepril 40 MG tablet Commonly known as:  LOTENSIN Take 40 mg by mouth daily.   calcitRIOL 0.25 MCG  capsule Commonly known as:  ROCALTROL Take 0.25 mcg by mouth daily.   Colchicine 0.6 MG Caps Take 0.3 mg by mouth every morning. Take 2 today   cyproheptadine 4 MG tablet Commonly known as:  PERIACTIN Take one tablet three hours prior to intercourse   enoxaparin 40 MG/0.4ML injection Commonly known as:  LOVENOX Inject 0.4 mLs (40 mg total) into the skin daily for 14 days. Start taking on:  02/25/2018   hydrALAZINE 100 MG tablet Commonly known as:  APRESOLINE Take 100 mg by mouth 3 (three) times daily.   lithium carbonate 300 MG CR tablet Commonly known as:  LITHOBID Take 1 tablet (300 mg total) by mouth at bedtime.   loperamide 2 MG capsule Commonly known as:  IMODIUM Take 2 mg by mouth as needed for diarrhea or loose stools.   multivitamin tablet Take 1 tablet by mouth daily.   OVER THE COUNTER MEDICATION Apply 1 application topically daily as needed (DMSO Cream for knee pain).   oxyCODONE 5 MG immediate release tablet Commonly known as:  Oxy IR/ROXICODONE Take 1 tablet (5 mg total) by mouth every 4 (four) hours as needed for moderate pain (pain score 4-6).   QUEtiapine 25 MG tablet Commonly known as:  SEROQUEL Take 1 tablet (25 mg total) by mouth at bedtime.   traMADol 50 MG tablet Commonly known as:  ULTRAM Take 1-2 tablets (50-100 mg total) by mouth every 4 (four) hours as needed for moderate pain.   traZODone 50 MG tablet Commonly known as:  DESYREL Take 25- 50mg  po qhs prn What changed:    how much to take  how to take this  when to take this  reasons to take this   Vitamin D (Cholecalciferol) 25 MCG (1000 UT) Tabs Take 1 tablet by mouth daily.            Durable Medical Equipment  (From admission, onward)         Start     Ordered   02/22/18 2016  DME Walker rolling  Once    Question:  Patient needs a walker to treat with the following condition  Answer:  Total knee replacement status   02/22/18 2015   02/22/18 2016  DME Bedside  commode  Once    Question:  Patient needs a bedside commode to treat with the following condition  Answer:  Total knee replacement status   02/22/18 2015          Diagnostic Studies: Dg Knee Left Port  Result Date: 02/22/2018 CLINICAL DATA:  Total knee replacement status. EXAM: PORTABLE LEFT KNEE - 1-2 VIEW COMPARISON:  None. FINDINGS: Surgical changes of LEFT knee  arthroplasty. Hardware appears appropriately positioned. Osseous alignment is anatomic. Expected postsurgical changes within the surrounding soft tissues. Drainage catheter at the level of the suprapatellar joint space. IMPRESSION: Surgical changes of LEFT knee arthroplasty. Hardware appears appropriately positioned. Electronically Signed   By: Franki Cabot M.D.   On: 02/22/2018 19:08   Dg Outside Films Extremity  Result Date: 02/22/2018 This examination belongs to an outside facility and is stored here for comparison purposes only.  Contact the originating outside institution for any associated report or interpretation.   Disposition:   Discharge Instructions    Increase activity slowly   Complete by:  As directed       Follow-up Information    Watt Climes, PA On 03/09/2018.   Specialty:  Physician Assistant Why:  at 9:15am Contact information: [place] [address] [phone]        Dereck Leep, MD On 04/06/2018.   Specialty:  Orthopedic Surgery Why:  at 9:15am Contact information: [place] [address] [phone]            Signed: Watt Climes 02/23/2018, 7:11 AM

## 2018-02-23 NOTE — Evaluation (Signed)
Occupational Therapy Evaluation Patient Details Name: Luis Miller. MRN: [id number] DOB: [birth year] Today's Date: 02/23/2018    History of Present Illness 72 y/o male s/p L TKA 02/22/18.   Clinical Impression   Pt seen for OT evaluation this date, POD#1 from above surgery. Pt was independent in all ADLs prior to surgery and is eager to return to PLOF with less pain and improved safety and independence. Pt currently requires minimal assist for LB dressing/bathing while in seated position due to pain and limited AROM of L knee. Pt instructed in polar care mgt, falls prevention strategies, home/routines modifications, DME/AE for LB bathing and dressing tasks, and compression stocking mgt. Will benefit from skilled OT while in the hospital to address impairments/deficits. Do not currently anticipate any additional skilled OT needs following this hospitalization.      Follow Up Recommendations  No OT follow up    Equipment Recommendations  3 in 1 bedside commode    Recommendations for Other Services       Precautions / Restrictions Precautions Precautions: Knee Restrictions Weight Bearing Restrictions: Yes LLE Weight Bearing: Weight bearing as tolerated      Mobility Bed Mobility             General bed mobility comments: deferred, pt up in recliner  Transfers Overall transfer level: Independent Equipment used: Rolling walker (2 wheeled)             General transfer comment: no assist or cues required    Balance Overall balance assessment: Independent                                         ADL either performed or assessed with clinical judgement   ADL Overall ADL's : Needs assistance/impaired                                       General ADL Comments: Min A for LB bathing/dressing; Max A for compression stockings     Vision Baseline Vision/History: Wears glasses Wears Glasses: At all times Patient Visual Report: No  change from baseline       Perception     Praxis      Pertinent Vitals/Pain Pain Assessment: 0-10 Pain Score: 5  Pain Location: L knee Pain Descriptors / Indicators: Aching Pain Intervention(s): Limited activity within patient's tolerance;Monitored during session;Repositioned;Ice applied     Hand Dominance     Extremity/Trunk Assessment Upper Extremity Assessment Upper Extremity Assessment: Overall WFL for tasks assessed   Lower Extremity Assessment Lower Extremity Assessment: Overall WFL for tasks assessed(expected L LE post-op weakness, but functional )   Cervical / Trunk Assessment Cervical / Trunk Assessment: Normal   Communication Communication Communication: No difficulties   Cognition Arousal/Alertness: Awake/alert Behavior During Therapy: WFL for tasks assessed/performed Overall Cognitive Status: Within Functional Limits for tasks assessed                                     General Comments       Exercises Other Exercises Other Exercises: pt educated in polar care mgt, compression stockings mgt, AE/DME for ADL, and falls prevention strategies   Shoulder Instructions      Home Living Family/patient expects to be discharged  to:: Private residence Living Arrangements: Alone Available Help at Discharge: Friend(s);Available PRN/intermittently;Available 24 hours/day;Family(ex wife, nephew, niece) Type of Home: House Home Access: (1/2 step thresh hold, no stairs)     Home Layout: One level     Bathroom Shower/Tub: Teacher, early years/pre: Handicapped height     Home Equipment: Environmental consultant - 2 wheels;Cane - single point;Adaptive equipment Adaptive Equipment: Reacher;Long-handled shoe horn        Prior Functioning/Environment Level of Independence: Independent        Comments: Pt is able to be active, drives, runs errands, etc        OT Problem List: Decreased knowledge of use of DME or AE;Pain      OT  Treatment/Interventions: Self-care/ADL training;DME and/or AE instruction    OT Goals(Current goals can be found in the care plan section) Acute Rehab OT Goals Patient Stated Goal: go home tomorrow OT Goal Formulation: With patient Time For Goal Achievement: 03/02/18 Potential to Achieve Goals: Good ADL Goals Pt Will Perform Lower Body Dressing: with modified independence;with adaptive equipment;sit to/from stand Pt Will Transfer to Toilet: with modified independence;ambulating(elevated commode, LRAD For amb) Additional ADL Goal #1: Pt will independently instruct family/caregiver in polar care mgt including wear schedule, positioning, and donning/doffing. Additional ADL Goal #2: Pt will independently instruct family/caregiver in compression stocking mgt including wear schedule, positioning, and donning/doffing.  OT Frequency: Min 1X/week   Barriers to D/C:            Co-evaluation              AM-PAC OT "6 Clicks" Daily Activity     Outcome Measure Help from another person eating meals?: None Help from another person taking care of personal grooming?: None Help from another person toileting, which includes using toliet, bedpan, or urinal?: None Help from another person bathing (including washing, rinsing, drying)?: A Little Help from another person to put on and taking off regular upper body clothing?: None Help from another person to put on and taking off regular lower body clothing?: A Little 6 Click Score: 22   End of Session    Activity Tolerance: Patient tolerated treatment well Patient left: in chair;with call bell/phone within reach;with chair alarm set;with SCD's reapplied;Other (comment)(polar care in place)  OT Visit Diagnosis: Other abnormalities of gait and mobility (R26.89);Pain Pain - Right/Left: Left Pain - part of body: Knee                Time: 9150-5697 OT Time Calculation (min): 21 min Charges:  OT General Charges $OT Visit: 1 Visit OT  Evaluation $OT Eval Low Complexity: 1 Low OT Treatments $Self Care/Home Management : 8-22 mins  Jeni Salles, MPH, MS, OTR/L ascom [phone] 02/23/18, 11:37 AM

## 2018-02-23 NOTE — Progress Notes (Signed)
PHARMACIST - PHYSICIAN COMMUNICATION  CONCERNING:  Enoxaparin (Lovenox) for DVT Prophylaxis    RECOMMENDATION: Patient was prescribed enoxaprin 40mg  q24 hours for VTE prophylaxis.   Filed Weights   02/22/18 1329  Weight: 199 lb 15.3 oz (90.7 kg)    Body mass index is 30.4 kg/m.  Estimated Creatinine Clearance: 24.8 mL/min (A) (by C-G formula based on SCr of 2.9 mg/dL (H)).  Patient is candidate for enoxaparin 30mg  every 24 hours based on CrCl <105ml/min or Weight less then 45kg for male and 50kg for male  DESCRIPTION: Pharmacy has adjusted enoxaparin dose per [facility] policy.  Patient is now receiving enoxaparin 30mg  every 24 hours.  Oswald Hillock, PharmD, BCPS Clinical Pharmacist  02/23/2018 11:45 AM

## 2018-02-23 NOTE — NC FL2 (Signed)
[place] LEVEL OF CARE SCREENING TOOL     IDENTIFICATION  Patient Name: Luis Miller. Birthdate: 06/17/1945 Sex: male Admission Date (Current Location): 02/22/2018  [place] and [place] Number:  Engineering geologist and Address:  [facility], [address] [place],  [address]      Provider Number: [id number]  Attending Physician Name and Address:  Dereck Leep, MD  Relative Name and Phone Number:       Current Level of Care: Hospital Recommended Level of Care: [place] Prior Approval Number:    Date Approved/Denied:   PASRR Number: [id number])  Discharge Plan: SNF    Current Diagnoses: Patient Active Problem List   Diagnosis Date Noted  . 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 Hemorrhoids, internal 12/12/2017  . Hx of adenomatous colonic polyps 12/05/2017  . Thrombosed external hemorrhoid 12/05/2017  . Drug-induced tremor 05/08/2017  . 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 Secondary hyperparathyroidism of renal origin ([place]) 04/05/2017  . Advanced care planning/counseling discussion 04/05/2017  . Carotid bruit 04/05/2017  . Primary osteoarthritis of left knee 04/05/2017  . Bradycardia 11/08/2016  . Gout 02/26/2016  . Erectile dysfunction due to arterial insufficiency 07/08/2015  . Obesity 11/27/2014  . CKD (chronic kidney disease) stage 3, GFR 30-59 ml/min (HCC) 11/27/2014  . Acute gout due to renal impairment involving right foot 11/22/2014  . 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  . Hypertension 09/09/2014  . Depressed bipolar I disorder in remission ([place]) 09/09/2014  . Impotence 09/09/2014  . Lithium use 10/18/2013  . Abnormal thyroid function test 10/01/2013  . Hypertrophy of prostate with urinary obstruction and other lower urinary tract symptoms (LUTS) 01/13/2012  . Elevated prostate specific antigen (PSA) 01/13/2012  . Benign prostatic hyperplasia with urinary obstruction 01/13/2012  .  ED (erectile dysfunction) of organic origin 01/13/2012  . Nocturia 01/13/2012  . 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 Peyronie's disease 01/13/2012  . Increased frequency of urination 01/13/2012  . Urinary hesitancy 01/13/2012    Orientation RESPIRATION BLADDER Height & Weight     Self, Time, Situation, Place  O2(2 Liters Oxygen. ) Continent Weight: 199 lb 15.3 oz (90.7 kg) Height:  5\' 8"  (172.7 cm)  BEHAVIORAL SYMPTOMS/MOOD NEUROLOGICAL BOWEL NUTRITION STATUS      Continent Diet(Diet: Regular )  AMBULATORY STATUS COMMUNICATION OF NEEDS Skin   Extensive Assist Verbally Surgical wounds(Incision: Left Knee. )                       Personal Care Assistance Level of Assistance  Bathing, Feeding, Dressing Bathing Assistance: Limited assistance Feeding assistance: Independent Dressing Assistance: Limited assistance     Functional Limitations Info  Sight, Hearing, Speech Sight Info: Adequate Hearing Info: Adequate Speech Info: Adequate    SPECIAL CARE FACTORS FREQUENCY  PT (By licensed PT), OT (By licensed OT)     PT Frequency: (5) OT Frequency: (5)            Contractures      Additional Factors Info  Code Status, Allergies Code Status Info: (Full Code. ) Allergies Info: (Mirtazapine, Olanzapine, Aspirin, Atomoxetine, Strattera Atomoxetine Hcl, Xyzal Levocetirizine Dihydrochloride)           Current Medications (02/23/2018):  This is the current hospital active medication list Current Facility-Administered Medications  Medication Dose Route Frequency Provider Last Rate Last Dose  . 0.9 %  sodium chloride infusion   Intravenous Continuous Hooten, Laurice Record, MD 100 mL/hr at 02/23/18  0820    . acetaminophen (OFIRMEV) IV 1,000 mg  1,000 mg Intravenous Q6H Hooten, Laurice Record, MD 400 mL/hr at 02/23/18 0517 1,000 mg at 02/23/18 0517  . acetaminophen (TYLENOL) tablet 325-650 mg  325-650 mg Oral Q6H PRN Hooten, Laurice Record, MD      . allopurinol (ZYLOPRIM) tablet 200 mg   200 mg Oral Daily Hooten, Laurice Record, MD   200 mg at 02/22/18 2149  . alum & mag hydroxide-simeth (MAALOX/MYLANTA) 200-200-20 MG/5ML suspension 30 mL  30 mL Oral Q4H PRN Hooten, Laurice Record, MD      . aMILoride (MIDAMOR) tablet 5 mg  5 mg Oral Daily Hooten, Laurice Record, MD      . amLODipine (NORVASC) tablet 10 mg  10 mg Oral Daily Hooten, Laurice Record, MD      . benazepril (LOTENSIN) tablet 40 mg  40 mg Oral Daily Hooten, Laurice Record, MD   40 mg at 02/22/18 2148  . bisacodyl (DULCOLAX) suppository 10 mg  10 mg Rectal Daily PRN Hooten, Laurice Record, MD      . calcitRIOL (ROCALTROL) capsule 0.25 mcg  0.25 mcg Oral Daily Hooten, Laurice Record, MD   0.25 mcg at 02/22/18 2149  . ceFAZolin (ANCEF) IVPB 2g/100 mL premix  2 g Intravenous Q6H Hooten, Laurice Record, MD 200 mL/hr at 02/23/18 0822 2 g at 02/23/18 2440  . celecoxib (CELEBREX) capsule 200 mg  200 mg Oral BID Dereck Leep, MD   200 mg at 02/23/18 1027  . cholecalciferol (VITAMIN D3) tablet 1,000 Units  1,000 Units Oral Daily Dereck Leep, MD   1,000 Units at 02/23/18 [phone]  . colchicine tablet 0.3 mg  0.3 mg Oral q morning - 10a Hooten, Laurice Record, MD   0.3 mg at 02/23/18 0824  . diphenhydrAMINE (BENADRYL) 12.5 MG/5ML elixir 12.5-25 mg  12.5-25 mg Oral Q4H PRN Hooten, Laurice Record, MD      . enoxaparin (LOVENOX) injection 30 mg  30 mg Subcutaneous Q12H Hooten, Laurice Record, MD   30 mg at 02/23/18 6440  . ferrous sulfate tablet 325 mg  325 mg Oral BID WC Dereck Leep, MD   325 mg at 02/23/18 3474  . gabapentin (NEURONTIN) capsule 300 mg  300 mg Oral QHS Hooten, Laurice Record, MD   300 mg at 02/22/18 2151  . hydrALAZINE (APRESOLINE) tablet 100 mg  100 mg Oral TID Dereck Leep, MD   100 mg at 02/22/18 2150  . HYDROmorphone (DILAUDID) injection 0.5-1 mg  0.5-1 mg Intravenous Q4H PRN Hooten, Laurice Record, MD      . lithium carbonate (LITHOBID) CR tablet 300 mg  300 mg Oral QHS Hooten, Laurice Record, MD   300 mg at 02/22/18 2149  . loperamide (IMODIUM) capsule 2 mg  2 mg Oral PRN Hooten, Laurice Record, MD       . magnesium hydroxide (MILK OF MAGNESIA) suspension 30 mL  30 mL Oral Daily Hooten, Laurice Record, MD   30 mL at 02/23/18 0824  . menthol-cetylpyridinium (CEPACOL) lozenge 3 mg  1 lozenge Oral PRN Hooten, Laurice Record, MD       Or  . phenol (CHLORASEPTIC) mouth spray 1 spray  1 spray Mouth/Throat PRN Hooten, Laurice Record, MD      . metoCLOPramide (REGLAN) tablet 5-10 mg  5-10 mg Oral Q8H PRN Hooten, Laurice Record, MD       Or  . metoCLOPramide (REGLAN) injection 5-10 mg  5-10 mg Intravenous Q8H PRN Hooten, Laurice Record, MD      .  metoCLOPramide (REGLAN) tablet 10 mg  10 mg Oral TID AC & HS Hooten, Laurice Record, MD   10 mg at 02/23/18 0823  . multivitamin with minerals tablet 1 tablet  1 tablet Oral Daily Hooten, Laurice Record, MD   1 tablet at 02/23/18 0824  . ondansetron (ZOFRAN) tablet 4 mg  4 mg Oral Q6H PRN Hooten, Laurice Record, MD       Or  . ondansetron (ZOFRAN) injection 4 mg  4 mg Intravenous Q6H PRN Hooten, Laurice Record, MD      . oxyCODONE (Oxy IR/ROXICODONE) immediate release tablet 10 mg  10 mg Oral Q4H PRN Hooten, Laurice Record, MD      . oxyCODONE (Oxy IR/ROXICODONE) immediate release tablet 5 mg  5 mg Oral Q4H PRN Dereck Leep, MD   5 mg at 02/23/18 0932  . pantoprazole (PROTONIX) EC tablet 40 mg  40 mg Oral BID Dereck Leep, MD   40 mg at 02/23/18 3557  . QUEtiapine (SEROQUEL) tablet 25 mg  25 mg Oral QHS Hooten, Laurice Record, MD   25 mg at 02/22/18 2152  . senna-docusate (Senokot-S) tablet 1 tablet  1 tablet Oral BID Dereck Leep, MD   1 tablet at 02/23/18 [phone]  . sodium phosphate (FLEET) 7-19 GM/118ML enema 1 enema  1 enema Rectal Once PRN Hooten, Laurice Record, MD      . traMADol Veatrice Bourbon) tablet 50-100 mg  50-100 mg Oral Q4H PRN Hooten, Laurice Record, MD      . traZODone (DESYREL) tablet 25-50 mg  25-50 mg Oral QHS PRN Hooten, Laurice Record, MD         Discharge Medications: Please see discharge summary for a list of discharge medications.  Relevant Imaging Results:  Relevant Lab Results:   Additional Information (SSN:  [id number])  Breyona Swander, Veronia Beets, LCSW

## 2018-02-23 NOTE — Evaluation (Signed)
Physical Therapy Evaluation Patient Details Name: Luis Miller. MRN: [id number] DOB: [birth year] Today's Date: 02/23/2018   History of Present Illness  72 y/o male s/p L TKA 02/22/18.  Clinical Impression  Pt did very well with first post-op PT session.  He was able to do all mobility tasks w/o assist, easily performed SLRs, had ~75 degrees of AROM flexion and 86 PROM and was able to ambulate with consistent cadence/appropriate speed ~75 ft w/o heavy walker use.  Overall pt at or beyond typical POD1 expectations and recovering nicely.     Follow Up Recommendations Home health PT    Equipment Recommendations       Recommendations for Other Services       Precautions / Restrictions Precautions Precautions: Knee Restrictions Weight Bearing Restrictions: Yes LLE Weight Bearing: Weight bearing as tolerated      Mobility  Bed Mobility Overal bed mobility: Independent             General bed mobility comments: Pt easily gets to sitting EOB w/o assist  Transfers Overall transfer level: Independent Equipment used: Rolling walker (2 wheeled)             General transfer comment: Pt is able to rise to standing w/o assist or heavy reliance on UEs/walker  Ambulation/Gait Ambulation/Gait assistance: Modified independent (Device/Increase time) Gait Distance (Feet): 75 Feet Assistive device: Rolling walker (2 wheeled)       General Gait Details: Pt able to maintain consistent cadence and walker motion with only limited WBing hesitation and good overall safety and tolerance  Stairs            Wheelchair Mobility    Modified Rankin (Stroke Patients Only)       Balance Overall balance assessment: Independent                                           Pertinent Vitals/Pain Pain Assessment: 0-10 Pain Score: 2 (increases to 6/10 with WBing/ambulation) Pain Location: L knee    Home Living Family/patient expects to be discharged to::  Private residence Living Arrangements: Alone Available Help at Discharge: Friend(s) Type of Home: House Home Access: (1/2 step thresh hold, no stairs)       Home Equipment: Environmental consultant - 2 wheels      Prior Function Level of Independence: Independent         Comments: Pt is able to be active, drives, runs errands, etc     Hand Dominance        Extremity/Trunk Assessment   Upper Extremity Assessment Upper Extremity Assessment: Overall WFL for tasks assessed    Lower Extremity Assessment Lower Extremity Assessment: Overall WFL for tasks assessed(expected L LE post-op weakness, but functional )       Communication   Communication: No difficulties  Cognition Arousal/Alertness: Awake/alert Behavior During Therapy: WFL for tasks assessed/performed Overall Cognitive Status: Within Functional Limits for tasks assessed                                        General Comments      Exercises Total Joint Exercises Ankle Circles/Pumps: AROM;10 reps Quad Sets: Strengthening;10 reps Gluteal Sets: Strengthening;10 reps Short Arc Quad: Strengthening;10 reps Heel Slides: AROM;10 reps Hip ABduction/ADduction: Strengthening;10 reps Straight Leg Raises: AROM;10 reps Knee  Flexion: PROM Goniometric ROM: 0-86   Assessment/Plan    PT Assessment Patient needs continued PT services  PT Problem List Decreased strength;Decreased range of motion;Decreased activity tolerance;Decreased balance;Decreased mobility;Decreased coordination;Decreased knowledge of use of DME;Decreased safety awareness       PT Treatment Interventions DME instruction;Gait training;Stair training;Functional mobility training;Therapeutic activities;Therapeutic exercise;Balance training;Neuromuscular re-education;Patient/family education    PT Goals (Current goals can be found in the Care Plan section)  Acute Rehab PT Goals Patient Stated Goal: go home tomorrow PT Goal Formulation: With  patient Time For Goal Achievement: 03/09/18 Potential to Achieve Goals: Good    Frequency BID   Barriers to discharge Decreased caregiver support      Co-evaluation               AM-PAC PT "6 Clicks" Mobility  Outcome Measure Help needed turning from your back to your side while in a flat bed without using bedrails?: None Help needed moving from lying on your back to sitting on the side of a flat bed without using bedrails?: None Help needed moving to and from a bed to a chair (including a wheelchair)?: None Help needed standing up from a chair using your arms (e.g., wheelchair or bedside chair)?: None Help needed to walk in hospital room?: None Help needed climbing 3-5 steps with a railing? : None 6 Click Score: 24    End of Session Equipment Utilized During Treatment: Gait belt Activity Tolerance: Patient tolerated treatment well Patient left: with chair alarm set;with call bell/phone within reach Nurse Communication: Mobility status PT Visit Diagnosis: Muscle weakness (generalized) (M62.81);Difficulty in walking, not elsewhere classified (R26.2)    Time: 4332-9518 PT Time Calculation (min) (ACUTE ONLY): 40 min   Charges:   PT Evaluation $PT Eval Low Complexity: 1 Low PT Treatments $Gait Training: 8-22 mins $Therapeutic Exercise: 8-22 mins        Kreg Shropshire, DPT 02/23/2018, 11:18 AM

## 2018-02-23 NOTE — Progress Notes (Signed)
Clinical Social Worker (CSW) received SNF consult. PT is recommending home health. RN case manager aware of above. Please reconsult if future social work needs arise. CSW signing off.   Dietrich Ke, LCSW [phone] 

## 2018-02-23 NOTE — Progress Notes (Signed)
Physical Therapy Treatment Patient Details Name: Luis Miller. MRN: [id number] DOB: 07/12/1945 Today's Date: 02/23/2018    History of Present Illness 72 y/o male s/p L TKA 02/22/18.    PT Comments    Pt did well with mobility, gait, strength, ROM and generally has made good gains with minimal need for heavy cuing, etc.  He easily circumambulated the nurses' station with consistent, quality cadence and no safety issues.  He has solid quad set with good ROM and quality of motion.  Pt overall doing very well, surpassing POD1 expectations.    Follow Up Recommendations  Home health PT     Equipment Recommendations       Recommendations for Other Services       Precautions / Restrictions Precautions Precautions: Knee Restrictions Weight Bearing Restrictions: Yes LLE Weight Bearing: Weight bearing as tolerated    Mobility  Bed Mobility Overal bed mobility: Independent             General bed mobility comments: Pt able to get LEs up into bed w/o assist or hesitation  Transfers Overall transfer level: Independent Equipment used: Rolling walker (2 wheeled)             General transfer comment: Pt able to rise w/o hesitation or issue  Ambulation/Gait Ambulation/Gait assistance: Modified independent (Device/Increase time) Gait Distance (Feet): 250 Feet Assistive device: Rolling walker (2 wheeled)       General Gait Details: Pt did very well with consistent, safe and confident ambulation.  He reported some pain and stiffness, but ultimatley had even and apprpriate cadence and speed with very little reliance on the walker.   Stairs             Wheelchair Mobility    Modified Rankin (Stroke Patients Only)       Balance Overall balance assessment: Independent                                          Cognition Arousal/Alertness: Awake/alert Behavior During Therapy: WFL for tasks assessed/performed Overall Cognitive Status: Within  Functional Limits for tasks assessed                                        Exercises Total Joint Exercises Ankle Circles/Pumps: AROM;10 reps Quad Sets: Strengthening;10 reps Gluteal Sets: Strengthening;10 reps Short Arc Quad: Strengthening;10 reps Heel Slides: Strengthening;10 reps Hip ABduction/ADduction: Strengthening;10 reps Straight Leg Raises: Strengthening;10 reps Knee Flexion: PROM;5 reps Goniometric ROM: 0-86 Other Exercises Other Exercises: pt educated in polar care mgt, compression stockings mgt, AE/DME for ADL, and falls prevention strategies    General Comments        Pertinent Vitals/Pain Pain Assessment: 0-10 Pain Score: 8  Pain Location: L knee Pain Descriptors / Indicators: Aching Pain Intervention(s): Limited activity within patient's tolerance;Monitored during session;Repositioned;Ice applied    Home Living Family/patient expects to be discharged to:: Private residence Living Arrangements: Alone Available Help at Discharge: Friend(s);Available PRN/intermittently;Available 24 hours/day;Family(ex wife, nephew, niece) Type of Home: House Home Access: (1/2 step thresh hold, no stairs)   Home Layout: One level Home Equipment: Walker - 2 wheels;Cane - single point;Adaptive equipment      Prior Function Level of Independence: Independent      Comments: Pt is able to be active, drives, runs errands,  etc   PT Goals (current goals can now be found in the care plan section) Acute Rehab PT Goals Patient Stated Goal: go home tomorrow PT Goal Formulation: With patient Time For Goal Achievement: 03/09/18 Potential to Achieve Goals: Good Progress towards PT goals: Progressing toward goals    Frequency    BID      PT Plan Current plan remains appropriate    Co-evaluation              AM-PAC PT "6 Clicks" Mobility   Outcome Measure  Help needed turning from your back to your side while in a flat bed without using bedrails?:  None Help needed moving from lying on your back to sitting on the side of a flat bed without using bedrails?: None Help needed moving to and from a bed to a chair (including a wheelchair)?: None Help needed standing up from a chair using your arms (e.g., wheelchair or bedside chair)?: None Help needed to walk in hospital room?: None Help needed climbing 3-5 steps with a railing? : None 6 Click Score: 24    End of Session Equipment Utilized During Treatment: Gait belt Activity Tolerance: Patient tolerated treatment well Patient left: with bed alarm set;with call bell/phone within reach Nurse Communication: Mobility status PT Visit Diagnosis: Muscle weakness (generalized) (M62.81);Difficulty in walking, not elsewhere classified (R26.2)     Time: 2229-7989 PT Time Calculation (min) (ACUTE ONLY): 31 min  Charges:  $Gait Training: 8-22 mins $Therapeutic Exercise: 8-22 mins                     Kreg Shropshire, DPT 02/23/2018, 2:51 PM

## 2018-02-23 NOTE — Anesthesia Postprocedure Evaluation (Signed)
Anesthesia Post Note  Patient: Luis Miller.  Procedure(s) Performed: COMPUTER ASSISTED TOTAL KNEE ARTHROPLASTY (Left )  Patient location during evaluation: Nursing Unit Anesthesia Type: Spinal Level of consciousness: oriented and awake and alert Pain management: pain level controlled Vital Signs Assessment: post-procedure vital signs reviewed and stable Respiratory status: spontaneous breathing and respiratory function stable Cardiovascular status: blood pressure returned to baseline and stable Postop Assessment: no headache, no backache, no apparent nausea or vomiting and patient able to bend at knees Anesthetic complications: no     Last Vitals:  Vitals:   02/22/18 2342 02/23/18 0433  BP: 137/69 126/65  Pulse: 61 67  Resp: 16 16  Temp: 36.8 C 36.8 C  SpO2: 98% 99%    Last Pain:  Vitals:   02/23/18 0433  TempSrc: Oral  PainSc:                  Hedda Slade

## 2018-02-23 NOTE — Progress Notes (Signed)
Home CPAP reviewed by RT and appears in good working condition. No loose or frayed wires. CPAP plugged into red outlet. Patient stated he would place CPAP on self when he is ready for bed. RT assistance not needed. Patient resting comfortably on Room air in bed.

## 2018-02-23 NOTE — Progress Notes (Signed)
   Subjective: 1 Day Post-Op Procedure(s) (LRB): COMPUTER ASSISTED TOTAL KNEE ARTHROPLASTY (Left) Patient reports pain as mild.   Patient is well, and has had no acute complaints or problems We will start therapy today.  Plan is to go Home after hospital stay. no nausea and no vomiting Patient denies any chest pains or shortness of breath. Patient resting well.  Slept off and on during the night.  No complaints this morning  Objective: Vital signs in last 24 hours: Temp:  [97.7 F (36.5 C)-98.4 F (36.9 C)] 98.2 F (36.8 C) (12/05 0433) Pulse Rate:  [51-76] 67 (12/05 0433) Resp:  [10-16] 16 (12/05 0433) BP: (106-161)/(65-98) 126/65 (12/05 0433) SpO2:  [96 %-100 %] 99 % (12/05 0433) Weight:  [90.7 kg] 90.7 kg (12/04 1329) Heels are non tender and elevated off the bed using rolled towels along with bone foam under operative heel  Intake/Output from previous day: 12/04 0701 - 12/05 0700 In: 6170.5 [P.O.:474; I.V.:2430.8; IV Piggyback:3265.7] Out: 835 [Urine:775; Drains:60] Intake/Output this shift: No intake/output data recorded.  No results for input(s): HGB in the last 72 hours. No results for input(s): WBC, RBC, HCT, PLT in the last 72 hours. No results for input(s): NA, K, CL, CO2, BUN, CREATININE, GLUCOSE, CALCIUM in the last 72 hours. No results for input(s): LABPT, INR in the last 72 hours.  EXAM General - Patient is Alert, Appropriate and Oriented Extremity - Neurologically intact Neurovascular intact Sensation intact distally Intact pulses distally Dorsiflexion/Plantar flexion intact Compartment soft Dressing - dressing C/D/I Motor Function - intact, moving foot and toes well on exam.  She is able to do straight leg raise on his own  Past Medical History:  Diagnosis Date  . Anemia   . Apnea, sleep   . Arthritis   . Bipolar affective ([place])   . Cancer ([place]) malignant melanoma  . CRI (chronic renal insufficiency)   . Crohn's disease ([place])   . Depression    . Diabetes insipidus ([place])   . Erectile dysfunction   . Gout   . Hyperlipidemia   . Hypertension   . IBS (irritable bowel syndrome)   . Impotence   . Internal hemorrhoids   . 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[place])   . Paraphimosis   . Peyronie disease   . Secondary hyperparathyroidism of renal origin ([place])   . Skin cancer   . Testicular hypofunction   . Urinary frequency   . Urinary hesitancy     Assessment/Plan: 1 Day Post-Op Procedure(s) (LRB): COMPUTER ASSISTED TOTAL KNEE ARTHROPLASTY (Left) Active Problems:   Total knee replacement status  Estimated body mass index is 30.4 kg/m as calculated from the following:   Height as of this encounter: 5' 8"  (1.727 m).   Weight as of this encounter: 90.7 kg. Advance diet Up with therapy D/C IV fluids Plan for discharge tomorrow Discharge home with home health  Labs: None DVT Prophylaxis - Lovenox, Foot Pumps and TED hose Weight-Bearing as tolerated to left leg D/C O2 and Pulse OX and try on Room Air Begin working on bowel movement   R. Wilbarger [place] 02/23/2018, 7:07 AM

## 2018-02-23 NOTE — Care Management Note (Addendum)
Case Management Note  Patient Details  Name: Luis T Dagley Jr. MRN: [id number] Date of Birth: 03/04/1946  Subjective/Objective:                  RNCM met with patient to discuss discharge planning. He plans to return to home where he stays alone. He has support from his ex-wife Luis Miller, niece Julie RN, and nephew.  He states he has a front-wheeled walker available for use at home.  Surgeon would like for RNCM to call in Lovenox 40mg injection daily for 14 days no-refills to pharmacy. He uses [facility] [phone].  He would like to use [facility] for home health.  Action/Plan:   Home health list provided to patient for review based on CMS.gov 3-5 star rating. [facility] updated that [facility]  Patient aware that surgeon prefers [facility]. Referral sent to [facility] as he does not have preference.   Expected Discharge Date:                  Expected Discharge Plan:     In-House Referral:     Discharge planning Services  CM Consult  Post Acute Care Choice:  Home Health Choice offered to:  Patient  DME Arranged:    DME Agency:     HH Arranged:  PT HH Agency:     Status of Service:  In process, will continue to follow  If discussed at Long Length of Stay Meetings, dates discussed:    Additional Comments:   , RN 02/23/2018, 1:45 PM  

## 2018-02-24 LAB — CBC
HCT: 26.9 % — ABNORMAL LOW (ref 39.0–52.0)
Hemoglobin: 9.1 g/dL — ABNORMAL LOW (ref 13.0–17.0)
MCH: 35.4 pg — ABNORMAL HIGH (ref 26.0–34.0)
MCHC: 33.8 g/dL (ref 30.0–36.0)
MCV: 104.7 fL — ABNORMAL HIGH (ref 80.0–100.0)
Platelets: 122 10*3/uL — ABNORMAL LOW (ref 150–400)
RBC: 2.57 MIL/uL — AB (ref 4.22–5.81)
RDW: 13.5 % (ref 11.5–15.5)
WBC: 6.8 10*3/uL (ref 4.0–10.5)
nRBC: 0 % (ref 0.0–0.2)

## 2018-02-24 LAB — CREATININE, SERUM
Creatinine, Ser: 3.9 mg/dL — ABNORMAL HIGH (ref 0.61–1.24)
GFR calc Af Amer: 17 mL/min — ABNORMAL LOW (ref >60)
GFR, EST NON AFRICAN AMERICAN: 14 mL/min — AB (ref >60)

## 2018-02-24 NOTE — Progress Notes (Signed)
   Subjective: 2 Days Post-Op Procedure(s) (LRB): COMPUTER ASSISTED TOTAL KNEE ARTHROPLASTY (Left) Patient reports pain as mild.   Patient is well, and has had no acute complaints or problems Patient did well with therapy yesterday.  Was able to ambulate around the nurses desk.  Still needs to do steps prior to being discharged. Patient resting comfortably and voices no complaints Plan is to go Home after hospital stay. no nausea and no vomiting Patient denies any chest pains or shortness of breath. Objective: Vital signs in last 24 hours: Temp:  [98 F (36.7 C)-98.5 F (36.9 C)] 98 F (36.7 C) (12/05 2120) Pulse Rate:  [64-65] 64 (12/05 2323) Resp:  [16-18] 18 (12/05 2323) BP: (119-130)/(57-79) 125/72 (12/05 2323) SpO2:  [95 %-100 %] 97 % (12/05 2323) well approximated incision Heels are non tender and elevated off the bed using rolled towels Intake/Output from previous day: 12/05 0701 - 12/06 0700 In: 1341.3 [P.O.:600; I.V.:641.3; IV Piggyback:100] Out: 740 [Urine:550; Drains:190] Intake/Output this shift: No intake/output data recorded.  Recent Labs    02/24/18 0414  HGB 9.1*   Recent Labs    02/24/18 0414  WBC 6.8  RBC 2.57*  HCT 26.9*  PLT 122*   Recent Labs    02/24/18 0414  CREATININE 3.90*   No results for input(s): LABPT, INR in the last 72 hours.  EXAM General - Patient is Alert, Appropriate and Oriented Extremity - Neurologically intact Neurovascular intact Sensation intact distally Intact pulses distally Dorsiflexion/Plantar flexion intact No cellulitis present Compartment soft Dressing - scant drainage Motor Function - intact, moving foot and toes well on exam.    Past Medical History:  Diagnosis Date  . Anemia   . Apnea, sleep   . Arthritis   . Bipolar affective ([place])   . Cancer ([place]) malignant melanoma  . CRI (chronic renal insufficiency)   . Crohn's disease ([place])   . Depression   . Diabetes insipidus ([place])   . Erectile  dysfunction   . Gout   . Hyperlipidemia   . Hypertension   . IBS (irritable bowel syndrome)   . Impotence   . Internal hemorrhoids   . 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 Paraphimosis   . Peyronie disease   . Secondary hyperparathyroidism of renal origin ([place])   . Skin cancer   . Testicular hypofunction   . Urinary frequency   . Urinary hesitancy     Assessment/Plan: 2 Days Post-Op Procedure(s) (LRB): COMPUTER ASSISTED TOTAL KNEE ARTHROPLASTY (Left) Active Problems:   Total knee replacement status  Estimated body mass index is 30.4 kg/m as calculated from the following:   Height as of this encounter: 5\' 8"  (1.727 m).   Weight as of this encounter: 90.7 kg. Up with therapy Discharge home with home health once he has done steps with physical therapy  Labs: None DVT Prophylaxis - Lovenox, Foot Pumps and TED hose Weight-Bearing as tolerated to left leg Hemovac was discontinued today.  Ends of the drain appeared to be intact. Please wash operative leg, change dressing and apply TED stockings to both legs Please give the patient 2 extra honeycomb dressings to take home  Breese. [place] 02/24/2018, 7:26 AM

## 2018-02-24 NOTE — Care Management (Signed)
RNCM spoke with [facility]- Last filled 02/23/18 via mail and unable to reorder. RNCM checked with patient and has never received Rx by mail. Patient advised to call RNCM back if he does not receive Lovenox by tomorrow 02/25/18 (when he should start self-administration). Surgery team notified.  [facility] notified of patient discharge.

## 2018-02-24 NOTE — Progress Notes (Signed)
Physical Therapy Treatment Patient Details Name: Luis Miller. MRN: [id number] DOB: 11/21/1945 Today's Date: 02/24/2018    History of Present Illness 72 y/o male s/p L TKA 02/22/18.    PT Comments    Pt continues to do well with all aspects of post-op mobility, strength, ROM, ambulation, etc.  He was able to maintain very confident and consistent cadence with ambulation, negotiated up/down steps w/o issue.  He had AROM of 0-105 and generally has made great gains.     Follow Up Recommendations  Home health PT     Equipment Recommendations       Recommendations for Other Services       Precautions / Restrictions Precautions Precautions: Knee Restrictions LLE Weight Bearing: Weight bearing as tolerated    Mobility  Bed Mobility Overal bed mobility: Independent                Transfers Overall transfer level: Independent Equipment used: Rolling walker (2 wheeled)             General transfer comment: Pt able to rise w/o hesitation or issue  Ambulation/Gait Ambulation/Gait assistance: Independent Gait Distance (Feet): 250 Feet Assistive device: Rolling walker (2 wheeled)       General Gait Details: Pt did very well with consistent, safe and confident ambulation.  He reported some pain and stiffness, but ultimatley had even and apprpriate cadence and speed with very little reliance on the walker.   Stairs Stairs: Yes Stairs assistance: Modified independent (Device/Increase time) Stair Management: Two rails Number of Stairs: 4 General stair comments: Pt easily negotiated up/down steps with step-to strategy   Wheelchair Mobility    Modified Rankin (Stroke Patients Only)       Balance Overall balance assessment: Independent                                          Cognition Arousal/Alertness: Awake/alert Behavior During Therapy: WFL for tasks assessed/performed Overall Cognitive Status: Within Functional Limits for tasks  assessed                                        Exercises Total Joint Exercises Ankle Circles/Pumps: AROM;10 reps Quad Sets: Strengthening;15 reps Gluteal Sets: Strengthening;15 reps Short Arc Quad: Strengthening;15 reps Heel Slides: Strengthening;10 reps Hip ABduction/ADduction: Strengthening;10 reps Straight Leg Raises: Strengthening;10 reps Knee Flexion: PROM;5 reps Goniometric ROM: 0-105 AROM    General Comments        Pertinent Vitals/Pain Pain Score: 4 (increases to 7/10 with activity/WBing/etc)    Home Living                      Prior Function            PT Goals (current goals can now be found in the care plan section) Progress towards PT goals: Progressing toward goals    Frequency    BID      PT Plan Current plan remains appropriate    Co-evaluation              AM-PAC PT "6 Clicks" Mobility   Outcome Measure  Help needed turning from your back to your side while in a flat bed without using bedrails?: None Help needed moving from lying on your back to sitting on  the side of a flat bed without using bedrails?: None Help needed moving to and from a bed to a chair (including a wheelchair)?: None Help needed standing up from a chair using your arms (e.g., wheelchair or bedside chair)?: None Help needed to walk in hospital room?: None Help needed climbing 3-5 steps with a railing? : None 6 Click Score: 24    End of Session Equipment Utilized During Treatment: Gait belt Activity Tolerance: Patient tolerated treatment well Patient left: with call bell/phone within reach;with chair alarm set;with family/visitor present Nurse Communication: Mobility status PT Visit Diagnosis: Muscle weakness (generalized) (M62.81);Difficulty in walking, not elsewhere classified (R26.2)     Time: 2637-8588 PT Time Calculation (min) (ACUTE ONLY): 26 min  Charges:  $Gait Training: 8-22 mins $Therapeutic Exercise: 8-22 mins                      Kreg Shropshire, DPT 02/24/2018, 11:30 AM

## 2018-02-24 NOTE — Care Management Important Message (Signed)
Important Message  Patient Details  Name: Luis Miller. MRN: [id number] Date of Birth: 03/11/1946   Medicare Important Message Given:  Yes    Juliann Pulse A Elisabet Gutzmer 02/24/2018, 12:16 PM

## 2018-02-25 DIAGNOSIS — Z96652 Presence of left artificial knee joint: Secondary | ICD-10-CM | POA: Diagnosis not present

## 2018-02-25 DIAGNOSIS — I129 Hypertensive chronic kidney disease with stage 1 through stage 4 chronic kidney disease, or unspecified chronic kidney disease: Secondary | ICD-10-CM | POA: Diagnosis not present

## 2018-02-25 DIAGNOSIS — K5090 Crohn's disease, unspecified, without complications: Secondary | ICD-10-CM | POA: Diagnosis not present

## 2018-02-25 DIAGNOSIS — E232 Diabetes insipidus: Secondary | ICD-10-CM | POA: Diagnosis not present

## 2018-02-25 DIAGNOSIS — M109 Gout, unspecified: Secondary | ICD-10-CM | POA: Diagnosis not present

## 2018-02-25 DIAGNOSIS — Z85828 Personal history of other malignant neoplasm of skin: Secondary | ICD-10-CM | POA: Diagnosis not present

## 2018-02-25 DIAGNOSIS — F319 Bipolar disorder, unspecified: Secondary | ICD-10-CM | POA: Diagnosis not present

## 2018-02-25 DIAGNOSIS — Z7901 Long term (current) use of anticoagulants: Secondary | ICD-10-CM | POA: Diagnosis not present

## 2018-02-25 DIAGNOSIS — Z471 Aftercare following joint replacement surgery: Secondary | ICD-10-CM | POA: Diagnosis not present

## 2018-02-25 DIAGNOSIS — G4733 Obstructive sleep apnea (adult) (pediatric): Secondary | ICD-10-CM | POA: Diagnosis not present

## 2018-02-25 DIAGNOSIS — N189 Chronic kidney disease, unspecified: Secondary | ICD-10-CM | POA: Diagnosis not present

## 2018-02-25 NOTE — Care Management (Addendum)
Post-discharge note entry:  RN case manager received notification that patient had not received his Lovenox by mail. RN case manager attempted to reach [facility] after [facility] [place] that medication could not be filled since this medicine was sent in to [place]. [facility] and [facility] did allow patient to fill Lovenox 80 mg syringes. RN case manager confirmed with surgeon to order Lovenox 80 mg inject 40 mg daily for 7 days pending delivery of Mail in  Prescription. Patient instructed by phone to waste half of the 80 mg dose only injecting 40 mg daily. Patient instructed to call case manager when he receives the Lovenox by mail.  Patient agreed to call case manager and repeated instructions given on wasting half of the 80 mg syringe per day per injection. RN case manager notified [facility] to re-enforce these instructions at tomorrow's visit. [facility] will [facility].  Update on 02/27/18 at 1045AM: RNCM received call from patient [phone] stating that [facility] [phone] has called him stating that they were going to ship 14-days of Lovenox 40mg  and his cost will by $300.  [facility] only going [facility] him ~160$ for all 14 syringes (at 80mg  dose).  RNCM has reached out to [place] [facility] [place] with request to cancel this order by mail so that it can be ordered through [facility] [facility] [phone]. Mail order has been cancelled by this Probation officer.  [facility] and attempted to order Lovenox 40mg  or 80mg  (whatever is covered by patients insurance). Per  Alyssa with [facility], order cannot be completed until tomorrow 02/28/18.  RNCM will update patient, Surgeon and add reminder to complete this tomorrow. Update 02/28/18: This RNCM reached out to [facility] to call in [facility]ox. It had already been taken care of and this have been confirmed with patient.  Cost for remaining injections $30.  No other RNCM needs.

## 2018-02-27 DIAGNOSIS — G4733 Obstructive sleep apnea (adult) (pediatric): Secondary | ICD-10-CM | POA: Diagnosis not present

## 2018-02-27 DIAGNOSIS — M109 Gout, unspecified: Secondary | ICD-10-CM | POA: Diagnosis not present

## 2018-02-27 DIAGNOSIS — I129 Hypertensive chronic kidney disease with stage 1 through stage 4 chronic kidney disease, or unspecified chronic kidney disease: Secondary | ICD-10-CM | POA: Diagnosis not present

## 2018-02-27 DIAGNOSIS — F319 Bipolar disorder, unspecified: Secondary | ICD-10-CM | POA: Diagnosis not present

## 2018-02-27 DIAGNOSIS — Z471 Aftercare following joint replacement surgery: Secondary | ICD-10-CM | POA: Diagnosis not present

## 2018-02-27 DIAGNOSIS — N189 Chronic kidney disease, unspecified: Secondary | ICD-10-CM | POA: Diagnosis not present

## 2018-03-01 DIAGNOSIS — Z471 Aftercare following joint replacement surgery: Secondary | ICD-10-CM | POA: Diagnosis not present

## 2018-03-01 DIAGNOSIS — N189 Chronic kidney disease, unspecified: Secondary | ICD-10-CM | POA: Diagnosis not present

## 2018-03-01 DIAGNOSIS — F319 Bipolar disorder, unspecified: Secondary | ICD-10-CM | POA: Diagnosis not present

## 2018-03-01 DIAGNOSIS — M109 Gout, unspecified: Secondary | ICD-10-CM | POA: Diagnosis not present

## 2018-03-01 DIAGNOSIS — I129 Hypertensive chronic kidney disease with stage 1 through stage 4 chronic kidney disease, or unspecified chronic kidney disease: Secondary | ICD-10-CM | POA: Diagnosis not present

## 2018-03-01 DIAGNOSIS — G4733 Obstructive sleep apnea (adult) (pediatric): Secondary | ICD-10-CM | POA: Diagnosis not present

## 2018-03-02 DIAGNOSIS — M109 Gout, unspecified: Secondary | ICD-10-CM | POA: Diagnosis not present

## 2018-03-02 DIAGNOSIS — I129 Hypertensive chronic kidney disease with stage 1 through stage 4 chronic kidney disease, or unspecified chronic kidney disease: Secondary | ICD-10-CM | POA: Diagnosis not present

## 2018-03-02 DIAGNOSIS — Z471 Aftercare following joint replacement surgery: Secondary | ICD-10-CM | POA: Diagnosis not present

## 2018-03-02 DIAGNOSIS — N189 Chronic kidney disease, unspecified: Secondary | ICD-10-CM | POA: Diagnosis not present

## 2018-03-02 DIAGNOSIS — G4733 Obstructive sleep apnea (adult) (pediatric): Secondary | ICD-10-CM | POA: Diagnosis not present

## 2018-03-02 DIAGNOSIS — F319 Bipolar disorder, unspecified: Secondary | ICD-10-CM | POA: Diagnosis not present

## 2018-03-05 ENCOUNTER — Other Ambulatory Visit: Payer: Self-pay | Admitting: Psychiatry

## 2018-03-06 DIAGNOSIS — N189 Chronic kidney disease, unspecified: Secondary | ICD-10-CM | POA: Diagnosis not present

## 2018-03-06 DIAGNOSIS — I129 Hypertensive chronic kidney disease with stage 1 through stage 4 chronic kidney disease, or unspecified chronic kidney disease: Secondary | ICD-10-CM | POA: Diagnosis not present

## 2018-03-06 DIAGNOSIS — F319 Bipolar disorder, unspecified: Secondary | ICD-10-CM | POA: Diagnosis not present

## 2018-03-06 DIAGNOSIS — M109 Gout, unspecified: Secondary | ICD-10-CM | POA: Diagnosis not present

## 2018-03-06 DIAGNOSIS — Z471 Aftercare following joint replacement surgery: Secondary | ICD-10-CM | POA: Diagnosis not present

## 2018-03-06 DIAGNOSIS — G4733 Obstructive sleep apnea (adult) (pediatric): Secondary | ICD-10-CM | POA: Diagnosis not present

## 2018-03-08 DIAGNOSIS — I129 Hypertensive chronic kidney disease with stage 1 through stage 4 chronic kidney disease, or unspecified chronic kidney disease: Secondary | ICD-10-CM | POA: Diagnosis not present

## 2018-03-08 DIAGNOSIS — F319 Bipolar disorder, unspecified: Secondary | ICD-10-CM | POA: Diagnosis not present

## 2018-03-08 DIAGNOSIS — N189 Chronic kidney disease, unspecified: Secondary | ICD-10-CM | POA: Diagnosis not present

## 2018-03-08 DIAGNOSIS — G4733 Obstructive sleep apnea (adult) (pediatric): Secondary | ICD-10-CM | POA: Diagnosis not present

## 2018-03-08 DIAGNOSIS — M109 Gout, unspecified: Secondary | ICD-10-CM | POA: Diagnosis not present

## 2018-03-08 DIAGNOSIS — Z471 Aftercare following joint replacement surgery: Secondary | ICD-10-CM | POA: Diagnosis not present

## 2018-03-09 DIAGNOSIS — M25562 Pain in left knee: Secondary | ICD-10-CM | POA: Diagnosis not present

## 2018-03-09 DIAGNOSIS — Z96652 Presence of left artificial knee joint: Secondary | ICD-10-CM | POA: Diagnosis not present

## 2018-03-13 DIAGNOSIS — Z96652 Presence of left artificial knee joint: Secondary | ICD-10-CM | POA: Diagnosis not present

## 2018-03-17 DIAGNOSIS — Z96652 Presence of left artificial knee joint: Secondary | ICD-10-CM | POA: Diagnosis not present

## 2018-03-17 DIAGNOSIS — M25562 Pain in left knee: Secondary | ICD-10-CM | POA: Diagnosis not present

## 2018-03-20 DIAGNOSIS — Z96652 Presence of left artificial knee joint: Secondary | ICD-10-CM | POA: Diagnosis not present

## 2018-03-20 DIAGNOSIS — M25562 Pain in left knee: Secondary | ICD-10-CM | POA: Diagnosis not present

## 2018-03-23 DIAGNOSIS — Z96652 Presence of left artificial knee joint: Secondary | ICD-10-CM | POA: Diagnosis not present

## 2018-03-23 DIAGNOSIS — M25562 Pain in left knee: Secondary | ICD-10-CM | POA: Diagnosis not present

## 2018-03-27 ENCOUNTER — Ambulatory Visit (INDEPENDENT_AMBULATORY_CARE_PROVIDER_SITE_OTHER): Payer: Medicare Other | Admitting: Psychiatry

## 2018-03-27 ENCOUNTER — Other Ambulatory Visit: Payer: Self-pay

## 2018-03-27 ENCOUNTER — Encounter: Payer: Self-pay | Admitting: Psychiatry

## 2018-03-27 VITALS — BP 144/69 | HR 65 | Temp 97.6°F

## 2018-03-27 DIAGNOSIS — G4700 Insomnia, unspecified: Secondary | ICD-10-CM

## 2018-03-27 DIAGNOSIS — F3160 Bipolar disorder, current episode mixed, unspecified: Secondary | ICD-10-CM | POA: Diagnosis not present

## 2018-03-27 MED ORDER — LITHIUM CARBONATE ER 300 MG PO TBCR: 300.0000 mg | EXTENDED_RELEASE_TABLET | Freq: Every day | ORAL | 1 refills | Status: DC

## 2018-03-27 MED ORDER — ZOLPIDEM TARTRATE 5 MG PO TABS: 5.0000 mg | ORAL_TABLET | Freq: Every evening | ORAL | 0 refills | Status: DC | PRN

## 2018-03-27 NOTE — Progress Notes (Signed)
Psychiatric MD Progress Note   Patient Identification: Luis Miller. MRN:  [id number] Date of Evaluation:  03/27/2018 Referral Source: Golden Pop, M.D Chief Complaint:    Visit Diagnosis:    ICD-10-CM   1. Bipolar I disorder, most recent episode mixed ([place]) F31.60   2. 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 He appeared tired during the interview.  Stated that he recently had the knee replacement surgery last month and is a still recuperating from the same.  Patient reported that he is difficulty having sleep and has been having restless night.  He stated that he has tried taking trazodone and tramadol and the medications are not helpful.  He was unable to fill the prescription of [place] as it was too expensive on his formulary.  Patient reported that he has been doing physical therapy on a regular basis.  He is interested in taking medications to help him sleep.  We discussed about again and he is agreeable with the plan.  He reported that he is also interested in going higher on the dose of Seroquel.  He stated that he does not have any depression at this time.  His kidney function is also stable and he will follow-up with his urologist next week.  He appeared calm and alert during the interview.  He denied having any suicidal homicidal ideations or plans.    He is compliant with his lithium and no side effects of the medications noted.     He is receptive to his medication changes and remains compliant with his pills.        Associated Signs/Symptoms: Depression Symptoms:  fatigue, loss of energy/fatigue, (Hypo) Manic Symptoms:  denied Anxiety Symptoms:  Excessive Worry, Psychotic Symptoms:  none PTSD Symptoms: Negative NA  Past Psychiatric History:  Long history of bipolar disorder. He reported that he has seen Dr. Vallarie Mare  for many years in the past. ECT Treatment - several  Psychiatric Admission x 4, [facility], [facility], [facility], [place] No h/o Suicide attemtps.   Previous Psychotropic Medications:  Lithium Mellaril Seroquel Does not remember the names.   Allergies- Remeron Olanzapine Strattera   Substance Abuse History in the last 12 months:  No.  Consequences of Substance Abuse: Negative NA  Past Medical History:  Past Medical History:  Diagnosis Date  . Anemia   . Apnea, sleep   . Arthritis   . Bipolar affective ([place])   . Cancer ([place]) malignant melanoma  . CRI (chronic renal insufficiency)   . Crohn's disease ([place])   . Depression   . Diabetes insipidus ([place])   . Erectile dysfunction   . Gout   . Hyperlipidemia   . Hypertension   . IBS (irritable bowel syndrome)   . Impotence   . Internal hemorrhoids   . 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[place])   . Paraphimosis   . Peyronie disease   . Secondary hyperparathyroidism of renal origin ([place])   . Skin cancer   . Testicular hypofunction   . Urinary frequency   . Urinary hesitancy     Past Surgical History:  Procedure Laterality Date  . arm fracture    . BONE MARROW BIOPSY    . COLONOSCOPY WITH PROPOFOL N/A 12/23/2014   Procedure: COLONOSCOPY WITH PROPOFOL;  Surgeon: Manya Silvas, MD;  Location: [facility] ENDOSCOPY;  Service: Endoscopy;  Laterality:  N/A;  . COLONOSCOPY WITH PROPOFOL N/A 02/06/2018   Procedure: COLONOSCOPY WITH PROPOFOL;  Surgeon: Manya Silvas, MD;  Location: [facility] ENDOSCOPY;  Service: Endoscopy;  Laterality: N/A;  . KNEE ARTHROPLASTY Left 02/22/2018   Procedure: COMPUTER ASSISTED TOTAL KNEE ARTHROPLASTY;  Surgeon: Dereck Leep, MD;  Location: [facility] ORS;  Service: Orthopedics;  Laterality: Left;  Marland Kitchen MELANOMA EXCISION    . PENILE PROSTHESIS IMPLANT    . PENILE PROSTHESIS IMPLANT N/A 08/25/2015   Procedure: REPLACEMENT OF INFLATABLE PENILE PROSTHESIS COMPONENTS;  Surgeon: Cleon Gustin, MD;  Location: [facility] ORS;   Service: Urology;  Laterality: N/A;  . SKIN CANCER EXCISION     nose  . TONSILLECTOMY    . VASECTOMY      Family Psychiatric History:  He reported bipolar disorder in his sister and nephew.  Family History:  Family History  Problem Relation Age of Onset  . Depression Sister   . Bipolar disorder Other   . Prostate cancer Neg Hx   . Kidney cancer Neg Hx   . Bladder Cancer Neg Hx     Social History:   Social History   Socioeconomic History  . Marital status: Divorced    Spouse name: Not on file  . Number of children: 2  . Years of education: Not on file  . Highest education level: High school graduate  Occupational History  . Occupation: retired  Scientific laboratory technician  . Financial resource strain: Not hard at all  . Food insecurity:    Worry: Never true    Inability: Never true  . Transportation needs:    Medical: No    Non-medical: No  Tobacco Use  . Smoking status: Former Smoker    Types: Cigarettes    Last attempt to quit: 09/08/1972    Years since quitting: 45.5  . Smokeless tobacco: Never Used  Substance and Sexual Activity  . Alcohol use: Not Currently    Alcohol/week: 0.0 standard drinks    Comment: Occasional glass of wine  . Drug use: No  . Sexual activity: Yes    Partners: Female    Birth control/protection: None  Lifestyle  . Physical activity:    Days per week: 1 day    Minutes per session: 120 min  . Stress: Not at all  Relationships  . Social connections:    Talks on phone: More than three times a week    Gets together: Twice a week    Attends religious service: More than 4 times per year    Active member of club or organization: Yes    Attends meetings of clubs or organizations: More than 4 times per year    Relationship status: Divorced  Other Topics Concern  . Not on file  Social History Narrative  . Not on file    Additional Social History:  Divorced- x 2 Lives with GF, she is 41 Retired- from [employer].    Allergies:   Allergies  Allergen  Reactions  . Mirtazapine Rash  . Olanzapine Rash  . Aspirin     Other reaction(s): Unknown Patient was instructed not to take aspirin but can not remember why  . Atomoxetine Other (See Comments)    Urinary sx  . Strattera [Atomoxetine Hcl] Other (See Comments)    Urinary sx  . Xyzal [Levocetirizine Dihydrochloride] Rash    Patient reported on 9/76/73    Metabolic Disorder Labs: No results found for: HGBA1C, MPG No results found for: PROLACTIN Lab Results  Component Value Date  CHOL 180 10/03/2017   TRIG 252 (H) 10/03/2017   HDL 43 04/05/2017   CHOLHDL 4.7 04/05/2017   VLDL 50 (H) 10/03/2017   LDLCALC 111 (H) 04/05/2017   LDLCALC 95 06/07/2016     Current Medications: Current Outpatient Medications  Medication Sig Dispense Refill  . allopurinol (ZYLOPRIM) 100 MG tablet Take 200 mg by mouth daily.     Marland Kitchen aMILoride (MIDAMOR) 5 MG tablet Take 1 tablet (5 mg total) by mouth daily. 90 tablet 4  . amLODipine (NORVASC) 10 MG tablet Take 1 tablet (10 mg total) by mouth daily. 90 tablet 4  . benazepril (LOTENSIN) 40 MG tablet Take 40 mg by mouth daily.     . calcitRIOL (ROCALTROL) 0.25 MCG capsule Take 0.25 mcg by mouth daily.    . Colchicine 0.6 MG CAPS Take 0.3 mg by mouth every morning. Take 2 today (Patient not taking: Reported on 02/08/2018) 30 capsule 3  . cyproheptadine (PERIACTIN) 4 MG tablet Take one tablet three hours prior to intercourse 90 tablet 3  . enoxaparin (LOVENOX) 40 MG/0.4ML injection Inject 0.4 mLs (40 mg total) into the skin daily for 14 days. 14 Syringe 0  . hydrALAZINE (APRESOLINE) 100 MG tablet Take 100 mg by mouth 3 (three) times daily.    Marland Kitchen lithium carbonate (LITHOBID) 300 MG CR tablet Take 1 tablet (300 mg total) by mouth at bedtime. 90 tablet 1  . loperamide (IMODIUM) 2 MG capsule Take 2 mg by mouth as needed for diarrhea or loose stools.    . Multiple Vitamin (MULTIVITAMIN) tablet Take 1 tablet by mouth daily.    Marland Kitchen OVER THE COUNTER MEDICATION Apply 1  application topically daily as needed (DMSO Cream for knee pain).    Marland Kitchen oxyCODONE (OXY IR/ROXICODONE) 5 MG immediate release tablet Take 1 tablet (5 mg total) by mouth every 4 (four) hours as needed for moderate pain (pain score 4-6). 30 tablet 0  . QUEtiapine (SEROQUEL) 25 MG tablet Take 1 tablet (25 mg total) by mouth at bedtime. 90 tablet 1  . traMADol (ULTRAM) 50 MG tablet Take 1-2 tablets (50-100 mg total) by mouth every 4 (four) hours as needed for moderate pain. 60 tablet 0  . traZODone (DESYREL) 50 MG tablet TAKE 1/2 TO 1 TABLET BY MOUTH AT BEDTIME AS NEEDED 30 tablet 1  . Vitamin D, Cholecalciferol, 25 MCG (1000 UT) TABS Take 1 tablet by mouth daily.      No current facility-administered medications for this visit.     Neurologic: Headache: No Seizure: No Paresthesias:No  Musculoskeletal: Strength & Muscle Tone: within normal limits Gait & Station: normal Patient leans: N/A  Psychiatric Specialty Exam: Review of Systems  Constitutional: Positive for malaise/fatigue.  Gastrointestinal: Positive for constipation.  Genitourinary: Positive for frequency and urgency.  Musculoskeletal: Positive for joint pain.  Neurological: Positive for weakness.  Psychiatric/Behavioral: The patient has insomnia.     There were no vitals taken for this visit.There is no height or weight on file to calculate BMI.  General Appearance: Casual  Eye Contact:  Fair  Speech:  Clear and Coherent  Volume:  Normal  Mood:  Anxious  Affect:  Appropriate and Congruent  Thought Process:  Coherent  Orientation:  Full (Time, Place, and Person)  Thought Content:  Logical  Suicidal Thoughts:  No  Homicidal Thoughts:  No  Memory:  Immediate;   Fair Recent;   Fair Remote;   Fair  Judgement:  Fair  Insight:  Fair  Psychomotor Activity:  Psychomotor  Retardation  Concentration:  Concentration: Fair and Attention Span: Fair  Recall:  [employer] of Knowledge:Fair  Language: Fair  Akathisia:  No  Handed:   Right  AIMS (if indicated):    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 Continue Seroquel 25 mg-50 milligrams p.o. nightly. He is also taking trazodone 50 mg at bedtime. I will start him on Ambien 5 mg and advised him to take half to 1 pill at bedtime on a as needed basis and he agreed with the plan.  He will follow-up in 3 to 4 weeks       More than 50% of the time spent in psychoeducation, counseling and coordination of care.    This note was generated in part or whole with voice recognition software. Voice regonition is usually quite accurate but there are transcription errors that can and very often do occur. I apologize for any typographical errors that were not detected and corrected.    Rainey Pines, MD 1/6/202010:00 AM

## 2018-03-28 DIAGNOSIS — M25562 Pain in left knee: Secondary | ICD-10-CM | POA: Diagnosis not present

## 2018-03-28 DIAGNOSIS — Z96652 Presence of left artificial knee joint: Secondary | ICD-10-CM | POA: Diagnosis not present

## 2018-03-30 DIAGNOSIS — Z96652 Presence of left artificial knee joint: Secondary | ICD-10-CM | POA: Diagnosis not present

## 2018-03-30 DIAGNOSIS — M25562 Pain in left knee: Secondary | ICD-10-CM | POA: Diagnosis not present

## 2018-04-04 DIAGNOSIS — M25562 Pain in left knee: Secondary | ICD-10-CM | POA: Diagnosis not present

## 2018-04-06 DIAGNOSIS — M25562 Pain in left knee: Secondary | ICD-10-CM | POA: Diagnosis not present

## 2018-04-06 DIAGNOSIS — Z96652 Presence of left artificial knee joint: Secondary | ICD-10-CM | POA: Diagnosis not present

## 2018-04-06 DIAGNOSIS — M1712 Unilateral primary osteoarthritis, left knee: Secondary | ICD-10-CM | POA: Diagnosis not present

## 2018-04-14 DIAGNOSIS — N184 Chronic kidney disease, stage 4 (severe): Secondary | ICD-10-CM | POA: Diagnosis not present

## 2018-04-14 DIAGNOSIS — N2581 Secondary hyperparathyroidism of renal origin: Secondary | ICD-10-CM | POA: Diagnosis not present

## 2018-04-14 DIAGNOSIS — I10 Essential (primary) hypertension: Secondary | ICD-10-CM | POA: Diagnosis not present

## 2018-04-14 DIAGNOSIS — D631 Anemia in chronic kidney disease: Secondary | ICD-10-CM | POA: Diagnosis not present

## 2018-04-14 DIAGNOSIS — E232 Diabetes insipidus: Secondary | ICD-10-CM | POA: Diagnosis not present

## 2018-04-17 ENCOUNTER — Ambulatory Visit: Payer: Medicare Other | Admitting: Psychiatry

## 2018-04-17 ENCOUNTER — Other Ambulatory Visit: Payer: Self-pay

## 2018-04-17 ENCOUNTER — Encounter: Payer: Self-pay | Admitting: Psychiatry

## 2018-04-17 VITALS — BP 142/67 | HR 61 | Temp 97.6°F | Wt 196.4 lb

## 2018-04-17 DIAGNOSIS — F3160 Bipolar disorder, current episode mixed, unspecified: Secondary | ICD-10-CM | POA: Diagnosis not present

## 2018-04-17 DIAGNOSIS — G4700 Insomnia, unspecified: Secondary | ICD-10-CM

## 2018-04-17 MED ORDER — TRAZODONE HCL 50 MG PO TABS: ORAL_TABLET | ORAL | 1 refills | Status: DC

## 2018-04-17 MED ORDER — QUETIAPINE FUMARATE 25 MG PO TABS: 25.0000 mg | ORAL_TABLET | Freq: Every day | ORAL | 1 refills | Status: DC

## 2018-04-17 NOTE — Progress Notes (Signed)
Psychiatric MD Progress Note   Patient Identification: Luis Miller. MRN:  [id number] Date of Evaluation:  04/17/2018 Referral Source: Golden Pop, M.D Chief Complaint:   Chief Complaint    Follow-up; Medication Refill     Visit Diagnosis:    ICD-10-CM   1. Bipolar I disorder, most recent episode mixed ([place]) F31.60   2. 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 He reported that he has been doing better and recuperating well after his knee surgery.  Patient stated that he is not following with the physical therapy for the past 2 weeks.  He reported that he will have appointment with his surgeon next week.  He reported that he has stopped using the cane as well.  Patient stated that he went to the dance 2 times since his surgery and first time he was using the cane and now he can do slow dancing.  Patient reported that he has been sleeping well now since he is more relaxed.  He tried Ambien only for 1 week but stopped the medication as it was not helpful.  He reported that trazodone has been helpful and he takes it in combination with his Seroquel.  He does not have any side effects of the medications.  He denied having any suicidal homicidal ideations or plans.  He is able to contract for safety.  Patient reported that the medications are helpful and he was talking in detail about the medications.   Patient reported that he does not have any relationships since last year and he is not looking for anybody for long-term relationship.  He was talking in detail about his ex-wife who is also living in the local area at this time.  He denied having any perceptual disturbances.  He denied having any suicidal homicidal ideations or plans.    He reported that his nephrologist Dr. Holley Raring has told him to attend classes regarding dialysis if he might  start doing the same and he was concerned about and was discussing in detail about the side effects of the dialysis.    Discussed about dialysis and different options for him.  He was discussing about the lifestyle changes including how it will affect his dance as he enjoyed his dancing on a weekly basis    He is receptive to his medication changes and remains compliant with his pills.        Associated Signs/Symptoms: Depression Symptoms:  fatigue, loss of energy/fatigue, (Hypo) Manic Symptoms:  denied Anxiety Symptoms:  Excessive Worry, Psychotic Symptoms:  none PTSD Symptoms: Negative NA  Past Psychiatric History:  Long history of bipolar disorder. He reported that he has seen Dr. Vallarie Mare for many years in the past. ECT Treatment - several  Psychiatric Admission x 4, [facility], [facility], [place], [place] No h/o Suicide attemtps.   Previous Psychotropic Medications:  Lithium Mellaril Seroquel Does not remember the names.   Allergies- Remeron Olanzapine Strattera   Substance Abuse History in the last 12 months:  No.  Consequences of Substance Abuse: Negative NA  Past Medical History:  Past Medical History:  Diagnosis Date  . Anemia   . Apnea, sleep   . Arthritis   . Bipolar affective ([place])   . Cancer ([place]) malignant melanoma  . CRI (chronic renal insufficiency)   . Crohn's disease ([place])   . Depression   .  Diabetes insipidus ([place])   . Erectile dysfunction   . Gout   . Hyperlipidemia   . Hypertension   . IBS (irritable bowel syndrome)   . Impotence   . Internal hemorrhoids   . 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[place])   . Paraphimosis   . Peyronie disease   . Secondary hyperparathyroidism of renal origin ([place])   . Skin cancer   . Testicular hypofunction   . Urinary frequency   . Urinary hesitancy     Past Surgical History:  Procedure Laterality Date  . arm fracture    . BONE MARROW BIOPSY    . COLONOSCOPY WITH PROPOFOL N/A 12/23/2014   Procedure:  COLONOSCOPY WITH PROPOFOL;  Surgeon: Manya Silvas, MD;  Location[facility] ENDOSCOPY;  Service: Endoscopy;  Laterality: N/A;  . COLONOSCOPY WITH PROPOFOL N/A 02/06/2018   Procedure: COLONOSCOPY WITH PROPOFOL;  Surgeon: Manya Silvas, MD;  Location: [facility] ENDOSCOPY;  Service: Endoscopy;  Laterality: N/A;  . KNEE ARTHROPLASTY Left 02/22/2018   Procedure: COMPUTER ASSISTED TOTAL KNEE ARTHROPLASTY;  Surgeon: Dereck Leep, MD;  Location: [facility] ORS;  Service: Orthopedics;  Laterality: Left;  Marland Kitchen MELANOMA EXCISION    . PENILE PROSTHESIS IMPLANT    . PENILE PROSTHESIS IMPLANT N/A 08/25/2015   Procedure: REPLACEMENT OF INFLATABLE PENILE PROSTHESIS COMPONENTS;  Surgeon: Cleon Gustin, MD;  Location: [facility] ORS;  Service: Urology;  Laterality: N/A;  . SKIN CANCER EXCISION     nose  . TONSILLECTOMY    . VASECTOMY      Family Psychiatric History:  He reported bipolar disorder in his sister and nephew.  Family History:  Family History  Problem Relation Age of Onset  . Depression Sister   . Bipolar disorder Other   . Prostate cancer Neg Hx   . Kidney cancer Neg Hx   . Bladder Cancer Neg Hx     Social History:   Social History   Socioeconomic History  . Marital status: Divorced    Spouse name: Not on file  . Number of children: 2  . Years of education: Not on file  . Highest education level: High school graduate  Occupational History  . Occupation: retired  Scientific laboratory technician  . Financial resource strain: Not hard at all  . Food insecurity:    Worry: Never true    Inability: Never true  . Transportation needs:    Medical: No    Non-medical: No  Tobacco Use  . Smoking status: Former Smoker    Types: Cigarettes    Last attempt to quit: 09/08/1972    Years since quitting: 45.6  . Smokeless tobacco: Never Used  Substance and Sexual Activity  . Alcohol use: Not Currently    Alcohol/week: 0.0 standard drinks    Comment: Occasional glass of wine  . Drug use: No  . Sexual activity: Yes     Partners: Female    Birth control/protection: None  Lifestyle  . Physical activity:    Days per week: 1 day    Minutes per session: 120 min  . Stress: Not at all  Relationships  . Social connections:    Talks on phone: More than three times a week    Gets together: Twice a week    Attends religious service: More than 4 times per year    Active member of club or organization: Yes    Attends meetings of clubs or organizations: More than 4 times per year    Relationship status: Divorced  Other Topics Concern  .  Not on file  Social History Narrative  . Not on file    Additional Social History:  Divorced- x 2 Lives with GF, she is 68 Retired- from [employer].    Allergies:   Allergies  Allergen Reactions  . Mirtazapine Rash  . Olanzapine Rash  . Aspirin     Other reaction(s): Unknown Patient was instructed not to take aspirin but can not remember why  . Atomoxetine Other (See Comments)    Urinary sx  . Strattera [Atomoxetine Hcl] Other (See Comments)    Urinary sx  . Xyzal [Levocetirizine Dihydrochloride] Rash    Patient reported on 7/37/10    Metabolic Disorder Labs: No results found for: HGBA1C, MPG No results found for: PROLACTIN Lab Results  Component Value Date   CHOL 180 10/03/2017   TRIG 252 (H) 10/03/2017   HDL 43 04/05/2017   CHOLHDL 4.7 04/05/2017   VLDL 50 (H) 10/03/2017   LDLCALC 111 (H) 04/05/2017   LDLCALC 95 06/07/2016     Current Medications: Current Outpatient Medications  Medication Sig Dispense Refill  . allopurinol (ZYLOPRIM) 100 MG tablet Take 200 mg by mouth daily.     Marland Kitchen aMILoride (MIDAMOR) 5 MG tablet Take 1 tablet (5 mg total) by mouth daily. 90 tablet 4  . amLODipine (NORVASC) 10 MG tablet Take 1 tablet (10 mg total) by mouth daily. 90 tablet 4  . benazepril (LOTENSIN) 40 MG tablet Take 40 mg by mouth daily.     . calcitRIOL (ROCALTROL) 0.25 MCG capsule Take 0.25 mcg by mouth daily.    . Colchicine 0.6 MG CAPS Take 0.3 mg by mouth every  morning. Take 2 today 30 capsule 3  . cyproheptadine (PERIACTIN) 4 MG tablet Take one tablet three hours prior to intercourse 90 tablet 3  . hydrALAZINE (APRESOLINE) 100 MG tablet Take 100 mg by mouth 3 (three) times daily.    Marland Kitchen lithium carbonate (LITHOBID) 300 MG CR tablet Take 1 tablet (300 mg total) by mouth at bedtime. 90 tablet 1  . loperamide (IMODIUM) 2 MG capsule Take 2 mg by mouth as needed for diarrhea or loose stools.    . Multiple Vitamin (MULTIVITAMIN) tablet Take 1 tablet by mouth daily.    Marland Kitchen OVER THE COUNTER MEDICATION Apply 1 application topically daily as needed (DMSO Cream for knee pain).    Marland Kitchen oxyCODONE (OXY IR/ROXICODONE) 5 MG immediate release tablet Take 1 tablet (5 mg total) by mouth every 4 (four) hours as needed for moderate pain (pain score 4-6). 30 tablet 0  . QUEtiapine (SEROQUEL) 25 MG tablet Take 1 tablet (25 mg total) by mouth at bedtime. 90 tablet 1  . traMADol (ULTRAM) 50 MG tablet Take 1-2 tablets (50-100 mg total) by mouth every 4 (four) hours as needed for moderate pain. 60 tablet 0  . traZODone (DESYREL) 50 MG tablet TAKE 1/2 TO 1 TABLET BY MOUTH AT BEDTIME AS NEEDED 30 tablet 1  . Vitamin D, Cholecalciferol, 25 MCG (1000 UT) TABS Take 1 tablet by mouth daily.     Marland Kitchen zolpidem (AMBIEN) 5 MG tablet Take 1 tablet (5 mg total) by mouth at bedtime as needed for sleep (take 1/2- 1 pill at bed time). 30 tablet 0  . enoxaparin (LOVENOX) 40 MG/0.4ML injection Inject 0.4 mLs (40 mg total) into the skin daily for 14 days. 14 Syringe 0   No current facility-administered medications for this visit.     Neurologic: Headache: No Seizure: No Paresthesias:No  Musculoskeletal: Strength & Muscle Tone:  within normal limits Gait & Station: normal Patient leans: N/A  Psychiatric Specialty Exam: Review of Systems  Constitutional: Positive for malaise/fatigue.  Gastrointestinal: Positive for constipation.  Genitourinary: Positive for frequency and urgency.   Musculoskeletal: Positive for joint pain.  Neurological: Positive for weakness.  Psychiatric/Behavioral: The patient has insomnia.     Blood pressure (!) 142/67, pulse 61, temperature 97.6 F (36.4 C), temperature source Oral, weight 196 lb 6.4 oz (89.1 kg).Body mass index is 29.86 kg/m.  General Appearance: Casual  Eye Contact:  Fair  Speech:  Clear and Coherent  Volume:  Normal  Mood:  Anxious  Affect:  Appropriate and Congruent  Thought Process:  Coherent  Orientation:  Full (Time, Place, and Person)  Thought Content:  Logical  Suicidal Thoughts:  No  Homicidal Thoughts:  No  Memory:  Immediate;   Fair Recent;   Fair Remote;   Fair  Judgement:  Fair  Insight:  Fair  Psychomotor Activity:  Psychomotor Retardation  Concentration:  Concentration: Fair and Attention Span: Fair  Recall:  [employer] of Knowledge:Fair  Language: Fair  Akathisia:  No  Handed:  Right  AIMS (if indicated):    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 Continue Seroquel 25 mg-50 milligrams p.o. nightly. He is also taking trazodone 50 mg at bedtime. D/c Ambien  He will follow-up in 3 months.    More than 50% of the time spent in psychoeducation, counseling and coordination of care.    This note was generated in part or whole with voice recognition software. Voice regonition is usually quite accurate but there are transcription errors that can and very often do occur. I apologize for any typographical errors that were not detected and corrected.    Rainey Pines, MD 1/27/202010:09 AM

## 2018-04-18 DIAGNOSIS — M25562 Pain in left knee: Secondary | ICD-10-CM | POA: Diagnosis not present

## 2018-04-18 DIAGNOSIS — Z96652 Presence of left artificial knee joint: Secondary | ICD-10-CM | POA: Diagnosis not present

## 2018-04-24 ENCOUNTER — Ambulatory Visit (INDEPENDENT_AMBULATORY_CARE_PROVIDER_SITE_OTHER): Payer: Medicare Other

## 2018-04-24 ENCOUNTER — Ambulatory Visit (INDEPENDENT_AMBULATORY_CARE_PROVIDER_SITE_OTHER): Payer: Medicare Other | Admitting: Family Medicine

## 2018-04-24 ENCOUNTER — Encounter: Payer: Self-pay | Admitting: Podiatry

## 2018-04-24 ENCOUNTER — Encounter: Payer: Self-pay | Admitting: Family Medicine

## 2018-04-24 ENCOUNTER — Ambulatory Visit (INDEPENDENT_AMBULATORY_CARE_PROVIDER_SITE_OTHER): Payer: Medicare Other | Admitting: Podiatry

## 2018-04-24 VITALS — BP 156/82 | HR 75 | Resp 16

## 2018-04-24 VITALS — BP 135/76 | HR 87 | Temp 98.3°F | Ht 66.85 in | Wt 193.0 lb

## 2018-04-24 DIAGNOSIS — N183 Chronic kidney disease, stage 3 unspecified: Secondary | ICD-10-CM

## 2018-04-24 DIAGNOSIS — I10 Essential (primary) hypertension: Secondary | ICD-10-CM

## 2018-04-24 DIAGNOSIS — F3176 Bipolar disorder, in full remission, most recent episode depressed: Secondary | ICD-10-CM | POA: Diagnosis not present

## 2018-04-24 DIAGNOSIS — M722 Plantar fascial fibromatosis: Secondary | ICD-10-CM | POA: Diagnosis not present

## 2018-04-24 DIAGNOSIS — R946 Abnormal results of thyroid function studies: Secondary | ICD-10-CM | POA: Diagnosis not present

## 2018-04-24 DIAGNOSIS — M10371 Gout due to renal impairment, right ankle and foot: Secondary | ICD-10-CM | POA: Diagnosis not present

## 2018-04-24 DIAGNOSIS — N184 Chronic kidney disease, stage 4 (severe): Secondary | ICD-10-CM | POA: Diagnosis not present

## 2018-04-24 DIAGNOSIS — Z7189 Other specified counseling: Secondary | ICD-10-CM

## 2018-04-24 DIAGNOSIS — E232 Diabetes insipidus: Secondary | ICD-10-CM

## 2018-04-24 DIAGNOSIS — N2581 Secondary hyperparathyroidism of renal origin: Secondary | ICD-10-CM | POA: Diagnosis not present

## 2018-04-24 DIAGNOSIS — Z1159 Encounter for screening for other viral diseases: Secondary | ICD-10-CM

## 2018-04-24 LAB — URINALYSIS, ROUTINE W REFLEX MICROSCOPIC
Bilirubin, UA: NEGATIVE
Glucose, UA: NEGATIVE
Ketones, UA: NEGATIVE
Nitrite, UA: NEGATIVE
RBC, UA: NEGATIVE
Specific Gravity, UA: <1.005 — ABNORMAL LOW (ref 1.005–1.030)
Urobilinogen, Ur: 0.2 mg/dL (ref 0.2–1.0)
pH, UA: 6 (ref 5.0–7.5)

## 2018-04-24 MED ORDER — AMILORIDE HCL 5 MG PO TABS: 5.0000 mg | ORAL_TABLET | Freq: Every day | ORAL | 4 refills | Status: DC

## 2018-04-24 MED ORDER — AMLODIPINE BESYLATE 10 MG PO TABS: 10.0000 mg | ORAL_TABLET | Freq: Every day | ORAL | 4 refills | Status: DC

## 2018-04-24 NOTE — Assessment & Plan Note (Signed)
Again stable and followed by nephrology

## 2018-04-24 NOTE — Assessment & Plan Note (Signed)
Followed by psychiatry and stable 

## 2018-04-24 NOTE — Assessment & Plan Note (Signed)
The current medical regimen is effective;  continue present plan and medications.  

## 2018-04-24 NOTE — Patient Instructions (Signed)

## 2018-04-24 NOTE — Assessment & Plan Note (Signed)
A voluntary discussion about advanced care planning including explanation and discussion of advanced directives was extentively discussed with the patient.  Explained about the healthcare proxy and living will was reviewed and packet with forms with expiration of how to fill them out was given.  Time spent: Encounter 16+ min individuals present: Patient 

## 2018-04-24 NOTE — Assessment & Plan Note (Signed)
Followed by nephrology secondary to lithium use and will probably be undergoing dialysis this year.

## 2018-04-24 NOTE — Assessment & Plan Note (Signed)
No further gout symptoms taking allopurinol every day

## 2018-04-24 NOTE — Progress Notes (Signed)
Subjective:  Patient ID: Luis Miller., male    DOB: 03/17/1946,  MRN: [id number] HPI Chief Complaint  Patient presents with  . Foot Pain    Plantar heel right - aching x 2 weeks, just had knee replacement surgery early December on left knee, AM pain, no treatment  . New Patient (Initial Visit)    73 y.o. male presents with the above complaint.   ROS: He denies fever chills nausea vomiting muscle aches pains calf pain back pain chest pain shortness of breath.  Past Medical History:  Diagnosis Date  . Anemia   . Apnea, sleep   . Arthritis   . Bipolar affective ([place])   . Cancer ([place]) malignant melanoma  . CRI (chronic renal insufficiency)   . Crohn's disease ([place])   . Depression   . Diabetes insipidus ([place])   . Erectile dysfunction   . Gout   . Hyperlipidemia   . Hypertension   . IBS (irritable bowel syndrome)   . Impotence   . Internal hemorrhoids   . 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[place])   . Paraphimosis   . Peyronie disease   . Secondary hyperparathyroidism of renal origin ([place])   . Skin cancer   . Testicular hypofunction   . Urinary frequency   . Urinary hesitancy    Past Surgical History:  Procedure Laterality Date  . arm fracture    . BONE MARROW BIOPSY    . COLONOSCOPY WITH PROPOFOL N/A 12/23/2014   Procedure: COLONOSCOPY WITH PROPOFOL;  Surgeon: Manya Silvas, MD;  Location: [facility] ENDOSCOPY;  Service: Endoscopy;  Laterality: N/A;  . COLONOSCOPY WITH PROPOFOL N/A 02/06/2018   Procedure: COLONOSCOPY WITH PROPOFOL;  Surgeon: Manya Silvas, MD;  Location: [facility] ENDOSCOPY;  Service: Endoscopy;  Laterality: N/A;  . KNEE ARTHROPLASTY Left 02/22/2018   Procedure: COMPUTER ASSISTED TOTAL KNEE ARTHROPLASTY;  Surgeon: Dereck Leep, MD;  Location: [facility] ORS;  Service: Orthopedics;  Laterality: Left;  Marland Kitchen MELANOMA EXCISION    . PENILE PROSTHESIS IMPLANT    . PENILE PROSTHESIS IMPLANT N/A 08/25/2015   Procedure: REPLACEMENT OF INFLATABLE PENILE PROSTHESIS  COMPONENTS;  Surgeon: Cleon Gustin, MD;  Location: [facility] ORS;  Service: Urology;  Laterality: N/A;  . SKIN CANCER EXCISION     nose  . TONSILLECTOMY    . VASECTOMY      Current Outpatient Medications:  .  allopurinol (ZYLOPRIM) 100 MG tablet, Take 200 mg by mouth daily. , Disp: , Rfl:  .  aMILoride (MIDAMOR) 5 MG tablet, Take 1 tablet (5 mg total) by mouth daily., Disp: 90 tablet, Rfl: 4 .  amLODipine (NORVASC) 10 MG tablet, Take 1 tablet (10 mg total) by mouth daily., Disp: 90 tablet, Rfl: 4 .  benazepril (LOTENSIN) 40 MG tablet, Take 40 mg by mouth daily. , Disp: , Rfl:  .  calcitRIOL (ROCALTROL) 0.25 MCG capsule, Take 0.25 mcg by mouth daily., Disp: , Rfl:  .  Colchicine 0.6 MG CAPS, Take 0.3 mg by mouth every morning. Take 2 today (Patient not taking: Reported on 04/24/2018), Disp: 30 capsule, Rfl: 3 .  cyproheptadine (PERIACTIN) 4 MG tablet, Take one tablet three hours prior to intercourse (Patient not taking: Reported on 04/24/2018), Disp: 90 tablet, Rfl: 3 .  hydrALAZINE (APRESOLINE) 100 MG tablet, Take 100 mg by mouth 3 (three) times daily., Disp: , Rfl:  .  lithium carbonate (LITHOBID) 300 MG CR tablet, Take 1 tablet (300 mg total) by mouth at bedtime., Disp: 90 tablet, Rfl: 1 .  loperamide (IMODIUM) 2 MG capsule, Take 2 mg by mouth as needed for diarrhea or loose stools., Disp: , Rfl:  .  Multiple Vitamin (MULTIVITAMIN) tablet, Take 1 tablet by mouth daily., Disp: , Rfl:  .  OVER THE COUNTER MEDICATION, Apply 1 application topically daily as needed (DMSO Cream for knee pain)., Disp: , Rfl:  .  QUEtiapine (SEROQUEL) 25 MG tablet, Take 1 tablet (25 mg total) by mouth at bedtime., Disp: 90 tablet, Rfl: 1 .  traZODone (DESYREL) 50 MG tablet, TAKE 1/2 TO 1 TABLET BY MOUTH AT BEDTIME AS NEEDED, Disp: 90 tablet, Rfl: 1 .  Vitamin D, Cholecalciferol, 25 MCG (1000 UT) TABS, Take 1 tablet by mouth daily. , Disp: , Rfl:   Allergies  Allergen Reactions  . Mirtazapine Rash  . Olanzapine Rash    . Aspirin     Other reaction(s): Unknown Patient was instructed not to take aspirin but can not remember why  . Atomoxetine Other (See Comments)    Urinary sx  . Strattera [Atomoxetine Hcl] Other (See Comments)    Urinary sx  . Xyzal [Levocetirizine Dihydrochloride] Rash    Patient reported on 11/02/17   Review of Systems Objective:   Vitals:   04/24/18 1338  BP: (!) 156/82  Pulse: 75  Resp: 16    General: Well developed, nourished, in no acute distress, alert and oriented x3   Dermatological: Skin is warm, dry and supple bilateral. Nails x 10 are well maintained; remaining integument appears unremarkable at this time. There are no open sores, no preulcerative lesions, no rash or signs of infection present.  Vascular: Dorsalis Pedis artery and Posterior Tibial artery pedal pulses are 2/4 bilateral with immedate capillary fill time. Pedal hair growth present. No varicosities and no lower extremity edema present bilateral.   Neruologic: Grossly intact via light touch bilateral. Vibratory intact via tuning fork bilateral. Protective threshold with Semmes Wienstein monofilament intact to all pedal sites bilateral. Patellar and Achilles deep tendon reflexes 2+ bilateral. No Babinski or clonus noted bilateral.   Musculoskeletal: No gross boney pedal deformities bilateral. No pain, crepitus, or limitation noted with foot and ankle range of motion bilateral. Muscular strength 5/5 in all groups tested bilateral.  He has pain on palpation medial calcaneal tubercle of the right heel.  With flexible pes planus.  Gait: Unassisted, Nonantalgic.    Radiographs:  Radiographs taken today demonstrate pes planus with soft tissue increase in density plantar calcaneal insertion site of the right heel.  Assessment & Plan:   Assessment: Plantar fasciitis with pes planus right.  Plan: Discussed etiology pathology conservative surgical therapies at this point I injected him 20 mg Kenalog 5 mg  Marcaine after sterile Betadine skin prep medial aspect of the right heel.  Discussed possible need for orthotics and placement plantar fascial brace.     [name], [place]

## 2018-04-24 NOTE — Progress Notes (Signed)
BP 135/76   Pulse 87   Temp 98.3 F (36.8 C) (Oral)   Ht 5' 6.85" (1.698 m)   Wt 193 lb (87.5 kg)   SpO2 100%   BMI 30.36 kg/m    Subjective:    Patient ID: Luis Miller., male    DOB: Aug 01, 1945, 73 y.o.   MRN: [id number]  HPI: Luis Miller. is a 73 y.o. male  Chief Complaint  Patient presents with  . Follow-up    Yearly visit, NO CPE due to physcial.    Patient all in all doing well followed by nephrology and will possibly be going on dialysis later this year. Patient had knee replacement surgery of his left knee in December and is recovering well. Working with psychiatry and stable with chronic lithium use and Seroquel. Sees urology for BPH symptoms and stable.  Relevant past medical, surgical, family and social history reviewed and updated as indicated. Interim medical history since our last visit reviewed. Allergies and medications reviewed and updated.  Review of Systems  Constitutional: Negative.   HENT: Negative.   Eyes: Negative.   Respiratory: Negative.   Cardiovascular: Negative.   Gastrointestinal: Negative.   Endocrine: Negative.   Genitourinary: Negative.   Musculoskeletal: Negative.   Skin: Negative.   Allergic/Immunologic: Negative.   Neurological: Negative.   Hematological: Negative.   Psychiatric/Behavioral: Negative.     Per HPI unless specifically indicated above     Objective:    BP 135/76   Pulse 87   Temp 98.3 F (36.8 C) (Oral)   Ht 5' 6.85" (1.698 m)   Wt 193 lb (87.5 kg)   SpO2 100%   BMI 30.36 kg/m   Wt Readings from Last 3 Encounters:  04/24/18 193 lb (87.5 kg)  02/22/18 199 lb 15.3 oz (90.7 kg)  02/08/18 200 lb (90.7 kg)    Physical Exam Constitutional:      Appearance: He is well-developed.  HENT:     Head: Normocephalic.     Right Ear: External ear normal.     Left Ear: External ear normal.     Nose: Nose normal.  Eyes:     Conjunctiva/sclera: Conjunctivae normal.     Pupils: Pupils are equal,  round, and reactive to light.  Neck:     Musculoskeletal: Normal range of motion and neck supple.     Thyroid: No thyromegaly.  Cardiovascular:     Rate and Rhythm: Normal rate and regular rhythm.     Heart sounds: Normal heart sounds.  Pulmonary:     Effort: Pulmonary effort is normal.     Breath sounds: Normal breath sounds.  Abdominal:     General: Bowel sounds are normal.     Palpations: Abdomen is soft. There is no hepatomegaly or splenomegaly.  Genitourinary:    Comments: Followed by urology Musculoskeletal: Normal range of motion.  Lymphadenopathy:     Cervical: No cervical adenopathy.  Skin:    General: Skin is warm and dry.  Neurological:     Mental Status: He is alert and oriented to person, place, and time.     Deep Tendon Reflexes: Reflexes are normal and symmetric.  Psychiatric:        Behavior: Behavior normal.        Thought Content: Thought content normal.        Judgment: Judgment normal.     Results for orders placed or performed during the hospital encounter of 02/22/18  CBC  Result Value Ref  Range   WBC 6.8 4.0 - 10.5 K/uL   RBC 2.57 (L) 4.22 - 5.81 MIL/uL   Hemoglobin 9.1 (L) 13.0 - 17.0 g/dL   HCT 26.9 (L) 39.0 - 52.0 %   MCV 104.7 (H) 80.0 - 100.0 fL   MCH 35.4 (H) 26.0 - 34.0 pg   MCHC 33.8 30.0 - 36.0 g/dL   RDW 13.5 11.5 - 15.5 %   Platelets 122 (L) 150 - 400 K/uL   nRBC 0.0 0.0 - 0.2 %  Creatinine, serum  Result Value Ref Range   Creatinine, Ser 3.90 (H) 0.61 - 1.24 mg/dL   GFR calc non Af Amer 14 (L) >60 mL/min   GFR calc Af Amer 17 (L) >60 mL/min  ABO/Rh  Result Value Ref Range   ABO/RH(D)      A POS Performed at [facility], [address], [place], [place] [address]       Assessment & Plan:   Problem List Items Addressed This Visit      Cardiovascular and Mediastinum   Hypertension - Primary (Chronic)    The current medical regimen is effective;  continue present plan and medications.       Relevant  Medications   amLODipine (NORVASC) 10 MG tablet   aMILoride (MIDAMOR) 5 MG tablet   Other Relevant Orders   CBC with Differential/Platelet   Comprehensive metabolic panel   Lipid panel   Urinalysis, Routine w reflex microscopic     Endocrine   Diabetes insipidus ([place])    Stable and followed by nephrology      Relevant Orders   CBC with Differential/Platelet   Comprehensive metabolic panel   Lipid panel   Urinalysis, Routine w reflex microscopic   Secondary hyperparathyroidism of renal origin ([place])    Again stable and followed by nephrology        Musculoskeletal and Integument   Acute gout due to renal impairment involving right foot    No further gout symptoms taking allopurinol every day        Genitourinary   CKD (chronic kidney disease) stage 4, GFR 15-29 ml/min (HCC)    Followed by nephrology secondary to lithium use and will probably be undergoing dialysis this year.      Relevant Orders   Lithium level     Other   Depressed bipolar I disorder in remission ([place]) (Chronic)    Followed by psychiatry and stable      Relevant Orders   Lithium level   Abnormal thyroid function test   Relevant Orders   TSH   Advanced care planning/counseling discussion    A voluntary discussion about advanced care planning including explanation and discussion of advanced directives was extentively discussed with the patient.  Explained about the healthcare proxy and living will was reviewed and packet with forms with expiration of how to fill them out was given.  Time spent: Encounter 16+ min individuals present: Patient       Other Visit Diagnoses    Need for hepatitis C screening test           Follow up plan: Return in about 1 year (around 04/25/2019) for Physical Exam.

## 2018-04-24 NOTE — Assessment & Plan Note (Signed)
Stable and followed by nephrology

## 2018-04-25 ENCOUNTER — Encounter: Payer: Self-pay | Admitting: Family Medicine

## 2018-04-25 ENCOUNTER — Telehealth: Payer: Self-pay

## 2018-04-25 DIAGNOSIS — Z471 Aftercare following joint replacement surgery: Secondary | ICD-10-CM | POA: Diagnosis not present

## 2018-04-25 DIAGNOSIS — I10 Essential (primary) hypertension: Secondary | ICD-10-CM

## 2018-04-25 LAB — CBC WITH DIFFERENTIAL/PLATELET
Basophils Absolute: 0 10*3/uL (ref 0.0–0.2)
Basos: 1 %
EOS (ABSOLUTE): 0.3 10*3/uL (ref 0.0–0.4)
Eos: 6 %
Hematocrit: 30.8 % — ABNORMAL LOW (ref 37.5–51.0)
Hemoglobin: 10.5 g/dL — ABNORMAL LOW (ref 13.0–17.7)
Immature Grans (Abs): 0 10*3/uL (ref 0.0–0.1)
Immature Granulocytes: 1 %
LYMPHS: 16 %
Lymphocytes Absolute: 0.7 10*3/uL (ref 0.7–3.1)
MCH: 33.8 pg — ABNORMAL HIGH (ref 26.6–33.0)
MCHC: 34.1 g/dL (ref 31.5–35.7)
MCV: 99 fL — ABNORMAL HIGH (ref 79–97)
Monocytes Absolute: 0.3 10*3/uL (ref 0.1–0.9)
Monocytes: 7 %
Neutrophils Absolute: 3 10*3/uL (ref 1.4–7.0)
Neutrophils: 69 %
PLATELETS: 173 10*3/uL (ref 150–450)
RBC: 3.11 x10E6/uL — ABNORMAL LOW (ref 4.14–5.80)
RDW: 13.2 % (ref 11.6–15.4)
WBC: 4.3 10*3/uL (ref 3.4–10.8)

## 2018-04-25 LAB — COMPREHENSIVE METABOLIC PANEL
ALT: 11 IU/L (ref 0–44)
AST: 14 IU/L (ref 0–40)
Albumin/Globulin Ratio: 1.4 (ref 1.2–2.2)
Albumin: 3.9 g/dL (ref 3.7–4.7)
Alkaline Phosphatase: 62 IU/L (ref 39–117)
BUN/Creatinine Ratio: 12 (ref 10–24)
BUN: 34 mg/dL — ABNORMAL HIGH (ref 8–27)
Bilirubin Total: 0.3 mg/dL (ref 0.0–1.2)
CO2: 21 mmol/L (ref 20–29)
Calcium: 10 mg/dL (ref 8.6–10.2)
Chloride: 109 mmol/L — ABNORMAL HIGH (ref 96–106)
Creatinine, Ser: 2.88 mg/dL — ABNORMAL HIGH (ref 0.76–1.27)
GFR calc Af Amer: 24 mL/min/{1.73_m2} — ABNORMAL LOW (ref >59)
GFR calc non Af Amer: 21 mL/min/{1.73_m2} — ABNORMAL LOW (ref >59)
Globulin, Total: 2.7 g/dL (ref 1.5–4.5)
Glucose: 129 mg/dL — ABNORMAL HIGH (ref 65–99)
Potassium: 4.2 mmol/L (ref 3.5–5.2)
Sodium: 147 mmol/L — ABNORMAL HIGH (ref 134–144)
Total Protein: 6.6 g/dL (ref 6.0–8.5)

## 2018-04-25 LAB — LIPID PANEL
CHOLESTEROL TOTAL: 168 mg/dL (ref 100–199)
Chol/HDL Ratio: 3.9 ratio (ref 0.0–5.0)
HDL: 43 mg/dL (ref >39)
LDL Calculated: 96 mg/dL (ref 0–99)
Triglycerides: 144 mg/dL (ref 0–149)
VLDL Cholesterol Cal: 29 mg/dL (ref 5–40)

## 2018-04-25 LAB — TSH: TSH: 0.508 u[IU]/mL (ref 0.450–4.500)

## 2018-04-25 LAB — LITHIUM LEVEL: Lithium Lvl: 0.7 mmol/L (ref 0.6–1.2)

## 2018-04-25 MED ORDER — AMLODIPINE BESYLATE 10 MG PO TABS: 10.0000 mg | ORAL_TABLET | Freq: Every day | ORAL | 4 refills | Status: DC

## 2018-04-25 NOTE — Telephone Encounter (Signed)
Amlodipine was sent to mail order, this needs to be sent to the local pharmacy.

## 2018-05-03 ENCOUNTER — Telehealth: Payer: Self-pay

## 2018-05-03 ENCOUNTER — Other Ambulatory Visit: Payer: Self-pay | Admitting: Psychiatry

## 2018-05-03 NOTE — Telephone Encounter (Signed)
Copied from [place] [id number]. Topic: General - Inquiry >> May 02, 2018  3:18 PM Rayann Heman wrote: Reason for CRM: pt called and stated that he received labs in the mail and would like someone to call him back to explain. Please advise >> May 03, 2018  9:38 AM Guadalupe Maple, MD wrote: Call pt   Phone call.

## 2018-05-22 ENCOUNTER — Ambulatory Visit (INDEPENDENT_AMBULATORY_CARE_PROVIDER_SITE_OTHER): Payer: Medicare Other | Admitting: Podiatry

## 2018-05-22 ENCOUNTER — Encounter: Payer: Self-pay | Admitting: Podiatry

## 2018-05-22 DIAGNOSIS — M722 Plantar fascial fibromatosis: Secondary | ICD-10-CM | POA: Diagnosis not present

## 2018-05-22 NOTE — Progress Notes (Signed)
He presents today for follow-up of his right heel states that is doing much better.  Objective: Vital signs are stable he is alert and oriented x3 states that he is 85% improved.  He has no reproducible pain on palpation medial Cokato tubercle of the right heel.  Assessment: Resolving plantar fasciitis right.  Plan: Discussed the need for orthotics.  Recommend he continue all other conservative therapies until completely resolved he declined an injection.

## 2018-06-12 ENCOUNTER — Telehealth: Payer: Self-pay

## 2018-06-12 DIAGNOSIS — I10 Essential (primary) hypertension: Secondary | ICD-10-CM

## 2018-06-12 MED ORDER — AMILORIDE HCL 5 MG PO TABS: 5.0000 mg | ORAL_TABLET | Freq: Every day | ORAL | 4 refills | Status: DC

## 2018-06-12 NOTE — Telephone Encounter (Signed)
Patient would like a script for Amiloride sent to [facility]

## 2018-06-16 DIAGNOSIS — N2581 Secondary hyperparathyroidism of renal origin: Secondary | ICD-10-CM | POA: Diagnosis not present

## 2018-06-16 DIAGNOSIS — N184 Chronic kidney disease, stage 4 (severe): Secondary | ICD-10-CM | POA: Diagnosis not present

## 2018-06-16 DIAGNOSIS — I10 Essential (primary) hypertension: Secondary | ICD-10-CM | POA: Diagnosis not present

## 2018-06-16 DIAGNOSIS — D631 Anemia in chronic kidney disease: Secondary | ICD-10-CM | POA: Diagnosis not present

## 2018-06-16 DIAGNOSIS — R809 Proteinuria, unspecified: Secondary | ICD-10-CM | POA: Diagnosis not present

## 2018-06-19 ENCOUNTER — Other Ambulatory Visit: Payer: Self-pay

## 2018-06-19 DIAGNOSIS — R972 Elevated prostate specific antigen [PSA]: Secondary | ICD-10-CM

## 2018-06-20 ENCOUNTER — Other Ambulatory Visit: Payer: Medicare Other

## 2018-06-20 ENCOUNTER — Other Ambulatory Visit: Payer: Self-pay

## 2018-06-20 DIAGNOSIS — R972 Elevated prostate specific antigen [PSA]: Secondary | ICD-10-CM | POA: Diagnosis not present

## 2018-06-21 ENCOUNTER — Telehealth: Payer: Self-pay | Admitting: *Deleted

## 2018-06-21 LAB — PSA: Prostate Specific Ag, Serum: 6.6 ng/mL — ABNORMAL HIGH (ref 0.0–4.0)

## 2018-06-21 NOTE — Telephone Encounter (Signed)
Attempted to [facility] unable to leave message   Notes recorded by Nori Riis, PA-C on 06/21/2018 at 8:53 AM EDT Please let Mr. Ryans know that his PSA is 6.6, which is relatively stable. We will see him in June.

## 2018-06-22 ENCOUNTER — Ambulatory Visit: Payer: Self-pay | Admitting: Urology

## 2018-06-22 NOTE — Telephone Encounter (Signed)
Patient informed-verbalized understanding. Aware of appointment.

## 2018-07-10 ENCOUNTER — Encounter: Payer: Self-pay | Admitting: Psychiatry

## 2018-07-10 ENCOUNTER — Ambulatory Visit (INDEPENDENT_AMBULATORY_CARE_PROVIDER_SITE_OTHER): Payer: Medicare Other | Admitting: Psychiatry

## 2018-07-10 ENCOUNTER — Other Ambulatory Visit: Payer: Self-pay

## 2018-07-10 DIAGNOSIS — G4700 Insomnia, unspecified: Secondary | ICD-10-CM

## 2018-07-10 DIAGNOSIS — F3160 Bipolar disorder, current episode mixed, unspecified: Secondary | ICD-10-CM

## 2018-07-10 MED ORDER — QUETIAPINE FUMARATE 25 MG PO TABS: 25.0000 mg | ORAL_TABLET | Freq: Every day | ORAL | 1 refills | Status: DC

## 2018-07-10 MED ORDER — TRAZODONE HCL 50 MG PO TABS: 50.0000 mg | ORAL_TABLET | Freq: Every evening | ORAL | 1 refills | Status: DC | PRN

## 2018-07-10 MED ORDER — LITHIUM CARBONATE ER 300 MG PO TBCR: 300.0000 mg | EXTENDED_RELEASE_TABLET | Freq: Every day | ORAL | 1 refills | Status: DC

## 2018-07-10 NOTE — Progress Notes (Signed)
TC on  07-10-18@ 8:51 pt medical and surgical hx was reviewed with no changes. Pt allergies reviewed with no changes. Pt medications and pharmacy was reviewed with no updated. No vitals taken because this is a phone visit.

## 2018-07-10 NOTE — Progress Notes (Signed)
Patient ID: Luis Coss., male   DOB: 1945-12-04, 73 y.o.   MRN: [id number]  Patient is a 73 year old male with history of bipolar followed up for telephone conversation. He reported that he has been doing well and staying at home most of the time. He reported that he has to occasionally go out for his grocery shopping. He stated that he has been compliant with his medication. [facility]. He stated that he is he feels most of his medication through [facility].he stated that he has been compliant with his medications. He has also been following with his nephrologist on a regular basis. He does not have any other acute issues at this time.   Plan.  Refilled his medications. Patient will follow up and three months earlier depending on his symptoms. I discussed the assessment and treatment plan with the patient. The patient was provided an opportunity to ask questions and all were answered. The patient agreed with the plan and demonstrated an understanding of the instructions.   The patient was advised to call back or seek an in-person evaluation if the symptoms worsen or if the condition fails to improve as anticipated.   I provided 10 minutes of non-face-to-face time during this encounter.

## 2018-07-20 DIAGNOSIS — L720 Epidermal cyst: Secondary | ICD-10-CM | POA: Diagnosis not present

## 2018-08-16 DIAGNOSIS — R809 Proteinuria, unspecified: Secondary | ICD-10-CM | POA: Diagnosis not present

## 2018-08-16 DIAGNOSIS — D631 Anemia in chronic kidney disease: Secondary | ICD-10-CM | POA: Diagnosis not present

## 2018-08-16 DIAGNOSIS — I10 Essential (primary) hypertension: Secondary | ICD-10-CM | POA: Diagnosis not present

## 2018-08-16 DIAGNOSIS — N184 Chronic kidney disease, stage 4 (severe): Secondary | ICD-10-CM | POA: Diagnosis not present

## 2018-08-16 DIAGNOSIS — N2581 Secondary hyperparathyroidism of renal origin: Secondary | ICD-10-CM | POA: Diagnosis not present

## 2018-08-21 ENCOUNTER — Ambulatory Visit: Payer: Self-pay | Admitting: Urology

## 2018-08-25 DIAGNOSIS — Z09 Encounter for follow-up examination after completed treatment for conditions other than malignant neoplasm: Secondary | ICD-10-CM | POA: Diagnosis not present

## 2018-08-25 DIAGNOSIS — L821 Other seborrheic keratosis: Secondary | ICD-10-CM | POA: Diagnosis not present

## 2018-08-25 DIAGNOSIS — Z85820 Personal history of malignant melanoma of skin: Secondary | ICD-10-CM | POA: Diagnosis not present

## 2018-08-25 DIAGNOSIS — D235 Other benign neoplasm of skin of trunk: Secondary | ICD-10-CM | POA: Diagnosis not present

## 2018-08-25 DIAGNOSIS — Z08 Encounter for follow-up examination after completed treatment for malignant neoplasm: Secondary | ICD-10-CM | POA: Diagnosis not present

## 2018-08-25 DIAGNOSIS — Z85828 Personal history of other malignant neoplasm of skin: Secondary | ICD-10-CM | POA: Diagnosis not present

## 2018-08-25 DIAGNOSIS — Z872 Personal history of diseases of the skin and subcutaneous tissue: Secondary | ICD-10-CM | POA: Diagnosis not present

## 2018-09-25 ENCOUNTER — Other Ambulatory Visit: Payer: Self-pay | Admitting: Family Medicine

## 2018-09-25 DIAGNOSIS — R972 Elevated prostate specific antigen [PSA]: Secondary | ICD-10-CM

## 2018-09-25 DIAGNOSIS — D631 Anemia in chronic kidney disease: Secondary | ICD-10-CM | POA: Diagnosis not present

## 2018-09-25 DIAGNOSIS — N184 Chronic kidney disease, stage 4 (severe): Secondary | ICD-10-CM | POA: Diagnosis not present

## 2018-09-25 DIAGNOSIS — N2581 Secondary hyperparathyroidism of renal origin: Secondary | ICD-10-CM | POA: Diagnosis not present

## 2018-09-25 DIAGNOSIS — R809 Proteinuria, unspecified: Secondary | ICD-10-CM | POA: Diagnosis not present

## 2018-09-25 DIAGNOSIS — I10 Essential (primary) hypertension: Secondary | ICD-10-CM | POA: Diagnosis not present

## 2018-09-26 ENCOUNTER — Other Ambulatory Visit: Payer: Self-pay

## 2018-09-26 ENCOUNTER — Other Ambulatory Visit: Payer: Medicare Other

## 2018-09-26 DIAGNOSIS — R972 Elevated prostate specific antigen [PSA]: Secondary | ICD-10-CM

## 2018-09-26 NOTE — Progress Notes (Signed)
09/27/2018 8:55 AM   [name] Nov 27, 1945 [id number]  Referring provider: Guadalupe Maple, MD [address] [place],  [place] [address]  Chief Complaint  Patient presents with  . Elevated PSA    HPI: Luis Miller is a 73 year old male with an elevated PSA, ED and BPH with LU TS who presents today for a 6 month follow up.  Elevated PSA Patient had a negative bx in 2002 for a PSA of 4.03 ng/mL.  His current PSA is 5.8 ng/mL in 09/2018.       Erectile dysfunction IPP placement for a malfunction IPP on 08/25/2015.  He is not sexually active at this time.    BPH WITH LUTS His IPSS score today is 4, which is mild lower urinary tract symptomatology. He is mostly satisfied with his quality life due to his urinary symptoms.  His major complaints are frequency and nocturia.  His previous I PSS score was 8/4.  He denies any dysuria, hematuria or suprapubic pain.  He also denies any recent fevers, chills, nausea or vomiting.  He is adopted.   His nephrologist, Dr. Holley Raring, is having him drink 64 ounces of water daily.   May need to start dialysis in the future.   IPSS    Row Name 09/27/18 0800         International Prostate Symptom Score   How often have you had the sensation of not emptying your bladder?  Not at All     How often have you had to urinate less than every two hours?  Less than half the time     How often have you found you stopped and started again several times when you urinated?  Not at All     How often have you found it difficult to postpone urination?  Not at All     How often have you had a weak urinary stream?  Not at All     How often have you had to strain to start urination?  Not at All     How many times did you typically get up at night to urinate?  2 Times     Total IPSS Score  4       Quality of Life due to urinary symptoms   If you were to spend the rest of your life with your urinary condition just the way it is now how would you feel about that?   Mostly Satisfied        Score:  1-7 Mild 8-19 Moderate 20-35 Severe   PMH: Past Medical History:  Diagnosis Date  . Anemia   . Apnea, sleep   . Arthritis   . Bipolar affective ([place])   . Cancer ([place]) malignant melanoma  . CRI (chronic renal insufficiency)   . Crohn's disease ([place])   . Depression   . Diabetes insipidus ([place])   . Erectile dysfunction   . Gout   . Hyperlipidemia   . Hypertension   . IBS (irritable bowel syndrome)   . Impotence   . Internal hemorrhoids   . 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[place])   . Paraphimosis   . Peyronie disease   . Secondary hyperparathyroidism of renal origin ([place])   . Skin cancer   . Testicular hypofunction   . Urinary frequency   . Urinary hesitancy     Surgical History: Past Surgical History:  Procedure Laterality Date  . arm fracture    . BONE MARROW  BIOPSY    . COLONOSCOPY WITH PROPOFOL N/A 12/23/2014   Procedure: COLONOSCOPY WITH PROPOFOL;  Surgeon: Manya Silvas, MD;  [facility] ENDOSCOPY;  Service: Endoscopy;  Laterality: N/A;  . COLONOSCOPY WITH PROPOFOL N/A 02/06/2018   Procedure: COLONOSCOPY WITH PROPOFOL;  Surgeon: Manya Silvas, MD;  Location: [facility] ENDOSCOPY;  Service: Endoscopy;  Laterality: N/A;  . KNEE ARTHROPLASTY Left 02/22/2018   Procedure: COMPUTER ASSISTED TOTAL KNEE ARTHROPLASTY;  Surgeon: Dereck Leep, MD;  Location: [facility] ORS;  Service: Orthopedics;  Laterality: Left;  Marland Kitchen MELANOMA EXCISION    . PENILE PROSTHESIS IMPLANT    . PENILE PROSTHESIS IMPLANT N/A 08/25/2015   Procedure: REPLACEMENT OF INFLATABLE PENILE PROSTHESIS COMPONENTS;  Surgeon: Cleon Gustin, MD;  Location: [facility] ORS;  Service: Urology;  Laterality: N/A;  . SKIN CANCER EXCISION     nose  . TONSILLECTOMY    . VASECTOMY      Home Medications:  Allergies as of 09/27/2018      Reactions   Mirtazapine Rash   Olanzapine Rash   Aspirin    Other reaction(s): Unknown Patient was instructed not to take aspirin but can not remember  why   Atomoxetine Other (See Comments)   Urinary sx   Strattera [atomoxetine Hcl] Other (See Comments)   Urinary sx   Xyzal [levocetirizine Dihydrochloride] Rash   Patient reported on 11/02/17      Medication List       Accurate as of September 27, 2018  8:55 AM. If you have any questions, ask your nurse or doctor.        STOP taking these medications   Colchicine 0.6 MG Caps Stopped by: Merle Whitehorn, PA-C     TAKE these medications   allopurinol 100 MG tablet Commonly known as: ZYLOPRIM Take 200 mg by mouth daily.   aMILoride 5 MG tablet Commonly known as: MIDAMOR Take 1 tablet (5 mg total) by mouth daily.   amLODipine 10 MG tablet Commonly known as: NORVASC Take 1 tablet (10 mg total) by mouth daily.   benazepril 40 MG tablet Commonly known as: LOTENSIN Take 40 mg by mouth daily.   calcitRIOL 0.25 MCG capsule Commonly known as: ROCALTROL Take 0.25 mcg by mouth daily.   cyproheptadine 4 MG tablet Commonly known as: PERIACTIN Take one tablet three hours prior to intercourse   hydrALAZINE 100 MG tablet Commonly known as: APRESOLINE Take 100 mg by mouth 3 (three) times daily.   lithium carbonate 300 MG CR tablet Commonly known as: LITHOBID Take 1 tablet (300 mg total) by mouth at bedtime.   loperamide 2 MG capsule Commonly known as: IMODIUM Take 2 mg by mouth as needed for diarrhea or loose stools.   multivitamin tablet Take 1 tablet by mouth daily.   OVER THE COUNTER MEDICATION Apply 1 application topically daily as needed (DMSO Cream for knee pain).   QUEtiapine 25 MG tablet Commonly known as: SEROquel Take 1 tablet (25 mg total) by mouth at bedtime.   traZODone 50 MG tablet Commonly known as: DESYREL Take 1 tablet (50 mg total) by mouth at bedtime as needed.   Vitamin D (Cholecalciferol) 25 MCG (1000 UT) Tabs Take 1 tablet by mouth daily.       Allergies:  Allergies  Allergen Reactions  . Mirtazapine Rash  . Olanzapine Rash  . Aspirin      Other reaction(s): Unknown Patient was instructed not to take aspirin but can not remember why  . Atomoxetine Other (See Comments)  Urinary sx  . Strattera [Atomoxetine Hcl] Other (See Comments)    Urinary sx  . Xyzal [Levocetirizine Dihydrochloride] Rash    Patient reported on 11/02/17    Family History: Family History  Problem Relation Age of Onset  . Depression Sister   . Bipolar disorder Other   . Prostate cancer Neg Hx   . Kidney cancer Neg Hx   . Bladder Cancer Neg Hx     Social History:  reports that he quit smoking about 46 years ago. His smoking use included cigarettes. He has never used smokeless tobacco. He reports previous alcohol use. He reports that he does not use drugs.  ROS: UROLOGY Frequent Urination?: Yes Hard to postpone urination?: No Burning/pain with urination?: No Get up at night to urinate?: Yes Leakage of urine?: No Urine stream starts and stops?: No Trouble starting stream?: No Do you have to strain to urinate?: No Blood in urine?: No Urinary tract infection?: No Sexually transmitted disease?: No Injury to kidneys or bladder?: No Painful intercourse?: No Weak stream?: No Erection problems?: No Penile pain?: No  Gastrointestinal Nausea?: No Vomiting?: No Indigestion/heartburn?: No Diarrhea?: No Constipation?: No  Constitutional Fever: No Night sweats?: No Weight loss?: No Fatigue?: No  Skin Skin rash/lesions?: No Itching?: No  Eyes Blurred vision?: No Double vision?: No  Ears/Nose/Throat Sore throat?: No Sinus problems?: No  Hematologic/Lymphatic Swollen glands?: No Easy bruising?: Yes  Cardiovascular Leg swelling?: No Chest pain?: No  Respiratory Cough?: No Shortness of breath?: No  Endocrine Excessive thirst?: Yes  Musculoskeletal Back pain?: No Joint pain?: No  Neurological Headaches?: No Dizziness?: No  Psychologic Depression?: Yes Anxiety?: No  Physical Exam: BP (!) 148/76 (BP Location:  Left Arm, Patient Position: Sitting, Cuff Size: Normal)   Pulse 72   Ht 5' 5.85" (1.673 m)   Wt 194 lb (88 kg)   BMI 31.46 kg/m   Constitutional:  Well nourished. Alert and oriented, No acute distress. HEENT: [place] AT, moist mucus membranes.  Trachea midline, no masses. Cardiovascular: No clubbing, cyanosis, or edema. Respiratory: Normal respiratory effort, no increased work of breathing. GI: Abdomen is soft, non tender, non distended, no abdominal masses. Liver and spleen not palpable.  No hernias appreciated.  Stool sample for occult testing is not indicated.   GU: No CVA tenderness.  No bladder fullness or masses.  Patient with uncircumcised phallus.  Foreskin easily retracted.   Urethral meatus is patent.  No penile discharge. No penile lesions or rashes. Glans floppy.  Cylinders in place.  Prosthesis is cycled and working properly.  Scrotum without lesions, cysts, rashes and/or edema.  Testicles are located scrotally bilaterally. No masses are appreciated in the testicles. Left and right epididymis are normal. Rectal: Patient with  normal sphincter tone. Anus and perineum without scarring or rashes. No rectal masses are appreciated. Prostate is approximately 50 grams, could only palpate the apex and midportion of the gland, no nodules are appreciated.  Lymph: No inguinal adenopathy. Neurologic: Grossly intact, no focal deficits, moving all 4 extremities. Psychiatric: Normal mood and affect.  Laboratory Data: Lab Results  Component Value Date   WBC 4.3 04/24/2018   HGB 10.5 (L) 04/24/2018   HCT 30.8 (L) 04/24/2018   MCV 99 (H) 04/24/2018   PLT 173 04/24/2018    Lab Results  Component Value Date   CREATININE 2.88 (H) 04/24/2018    Lab Results  Component Value Date   TSH 0.508 04/24/2018       Component Value Date/Time  CHOL 168 04/24/2018 0837   CHOL 180 10/03/2017 1101   HDL 43 04/24/2018 0837   CHOLHDL 3.9 04/24/2018 0837   VLDL 50 (H) 10/03/2017 1101   LDLCALC 96  04/24/2018 0837    Lab Results  Component Value Date   AST 14 04/24/2018   Lab Results  Component Value Date   ALT 11 04/24/2018   PSA History  5.6 ng/mL on 11/27/2013  4.9 ng/mL on 05/28/2014  5.7 ng/mL on 09/09/2014  5.5 ng/mL on 06/03/2015  6.4 ng/mL on 11/11/2015  6.4 ng/mL on 05/31/2016  5.6 ng/mL on 12/06/2016  7.1 ng/mL on 04/05/2017  6.4 ng/mL on 06/13/2017  5.7 ng/mL on 12/20/2017  6.6 ng/mL on 06/20/2018  5.8 ng/mL on 09/26/2018  I have reviewed the labs.  Assessment & Plan:    1. Elevated PSA PSA currently is 5.8 - age specific PSA 42 to 74 years of age the range is 0 to 6.5 RTC in 6 months for PSA and exam   2. Erectile dysfunction Currently not sexually active RTC in 6 months for exam  3. Ejaculatory disorder Patient does not have a sexual partner at this time  4. BPH with LUTS IPSS score is 4/2, it is worse Continue conservative management, avoiding bladder irritants and timed voiding's RTC in 6 months for IPSS, PSA and exam    Return in about 6 months (around 03/30/2019) for IPSS, PSA and exam.  These notes generated with voice recognition software. I apologize for typographical errors.  Zara Council, PA-C  [facility] [address] [place], [place] [address] [phone]

## 2018-09-27 ENCOUNTER — Encounter: Payer: Self-pay | Admitting: Urology

## 2018-09-27 ENCOUNTER — Ambulatory Visit (INDEPENDENT_AMBULATORY_CARE_PROVIDER_SITE_OTHER): Payer: Medicare Other | Admitting: Urology

## 2018-09-27 VITALS — BP 148/76 | HR 72 | Ht 65.85 in | Wt 194.0 lb

## 2018-09-27 DIAGNOSIS — N401 Enlarged prostate with lower urinary tract symptoms: Secondary | ICD-10-CM | POA: Diagnosis not present

## 2018-09-27 DIAGNOSIS — N5319 Other ejaculatory dysfunction: Secondary | ICD-10-CM

## 2018-09-27 DIAGNOSIS — N138 Other obstructive and reflux uropathy: Secondary | ICD-10-CM | POA: Diagnosis not present

## 2018-09-27 DIAGNOSIS — R972 Elevated prostate specific antigen [PSA]: Secondary | ICD-10-CM

## 2018-09-27 DIAGNOSIS — N529 Male erectile dysfunction, unspecified: Secondary | ICD-10-CM

## 2018-09-27 LAB — PSA: Prostate Specific Ag, Serum: 5.8 ng/mL — ABNORMAL HIGH (ref 0.0–4.0)

## 2018-10-09 ENCOUNTER — Encounter: Payer: Self-pay | Admitting: Psychiatry

## 2018-10-09 ENCOUNTER — Ambulatory Visit (INDEPENDENT_AMBULATORY_CARE_PROVIDER_SITE_OTHER): Payer: Medicare Other | Admitting: Psychiatry

## 2018-10-09 ENCOUNTER — Other Ambulatory Visit: Payer: Self-pay

## 2018-10-09 DIAGNOSIS — G4700 Insomnia, unspecified: Secondary | ICD-10-CM

## 2018-10-09 DIAGNOSIS — F3160 Bipolar disorder, current episode mixed, unspecified: Secondary | ICD-10-CM | POA: Diagnosis not present

## 2018-10-09 MED ORDER — QUETIAPINE FUMARATE 25 MG PO TABS: 25.0000 mg | ORAL_TABLET | Freq: Every day | ORAL | 1 refills | Status: DC

## 2018-10-09 MED ORDER — LITHIUM CARBONATE ER 300 MG PO TBCR: 300.0000 mg | EXTENDED_RELEASE_TABLET | Freq: Every day | ORAL | 1 refills | Status: DC

## 2018-10-09 MED ORDER — TRAZODONE HCL 50 MG PO TABS: 50.0000 mg | ORAL_TABLET | Freq: Every evening | ORAL | 1 refills | Status: DC | PRN

## 2018-10-09 NOTE — Progress Notes (Signed)
Patient ID: Luis Coss., male   DOB: 10-09-1945, 73 y.o.   MRN: [id number]  Patient is a 73 year old male with history of bipolar who was followed up for medication management. He reported that he is upset as she came to the office for his follow-up but nobody has notified him that the office visit has been canceled. He reported that he is driving in such a hot weather and he does not feel well. He stated that he has been following up with his nephrologist on a regular basis and is currently stable. He stated that he has been taking his medications as prescribed. He also received trazodone from the local pharmacy as well as from the [facility]. He would prefer to get all his medications from the mail-order pharmacy. He remains compliant with his medications. He denied having any suicidal or homicidal ideation or plans.  Plan Continue medications as prescribed. Follow-up in two months earlier depending on his symptoms   I connected with patient via telemedicine application and verified that I am speaking with the correct person using two identifiers.  I discussed the limitations of evaluation and management by telemedicine and the availability of in person appointments. The patient expressed understanding and agreed to proceed.   I discussed the assessment and treatment plan with the patient. The patient was provided an opportunity to ask questions and all were answered. The patient agreed with the plan and demonstrated an understanding of the instructions.   The patient was advised to call back or seek an in-person evaluation if the symptoms worsen or if the condition fails to improve as anticipated.   I provided 15 minutes of non-face-to-face time during this encounter.

## 2018-10-15 ENCOUNTER — Other Ambulatory Visit: Payer: Self-pay | Admitting: Psychiatry

## 2018-10-30 ENCOUNTER — Other Ambulatory Visit: Payer: Self-pay | Admitting: Family Medicine

## 2018-10-30 DIAGNOSIS — I10 Essential (primary) hypertension: Secondary | ICD-10-CM

## 2018-10-30 MED ORDER — AMLODIPINE BESYLATE 10 MG PO TABS: 10.0000 mg | ORAL_TABLET | Freq: Every day | ORAL | 1 refills | Status: DC

## 2018-10-30 NOTE — Telephone Encounter (Signed)
Medication Refill - Medication:  amLODipine (NORVASC) 10 MG tablet  Has the patient contacted their pharmacy?yes advised to call PCP  Preferred Pharmacy (with phone number or street name):  [place] - [address] [phone] (Phone) [phone] (Fax)   Agent: Please be advised that RX refills may take up to 3 business days. We ask that you follow-up with your pharmacy.

## 2018-10-30 NOTE — Telephone Encounter (Signed)
Requested Prescriptions  Pending Prescriptions Disp Refills  . amLODipine (NORVASC) 10 MG tablet 90 tablet 1    Sig: Take 1 tablet (10 mg total) by mouth daily.     Cardiovascular:  Calcium Channel Blockers Failed - 10/30/2018  2:44 PM      Failed - Last BP in normal range    BP Readings from Last 1 Encounters:  09/27/18 (!) 148/76         Failed - Valid encounter within last 6 months    Recent Outpatient Visits          6 months ago Essential hypertension   [place] Crissman, Jeannette How, MD   1 year ago Viral URI   [place] Trin[place] Post, PA-C   1 year ago Essential hypertension   [place] Crissman, Jeannette How, MD   1 year ago Essential hypertension   [place], Jeannette How, MD   1 year ago [place], [place]      Future Appointments            In 3 weeks  [place], [place]   In 5 months [place], [facility] [place]   In 5 months Crissman, Jeannette How, MD [facility], [place]

## 2018-11-08 DIAGNOSIS — H2513 Age-related nuclear cataract, bilateral: Secondary | ICD-10-CM | POA: Diagnosis not present

## 2018-11-20 ENCOUNTER — Ambulatory Visit (INDEPENDENT_AMBULATORY_CARE_PROVIDER_SITE_OTHER): Payer: Medicare Other

## 2018-11-20 VITALS — BP 100/64 | HR 47 | Temp 98.7°F | Resp 16 | Ht 68.0 in | Wt 198.2 lb

## 2018-11-20 DIAGNOSIS — Z0000 Encounter for general adult medical examination without abnormal findings: Secondary | ICD-10-CM

## 2018-11-20 DIAGNOSIS — Z23 Encounter for immunization: Secondary | ICD-10-CM

## 2018-11-20 NOTE — Patient Instructions (Signed)
Luis Miller , Thank you for taking time to come for your Medicare Wellness Visit. I appreciate your ongoing commitment to your health goals. Please review the following plan we discussed and let me know if I can assist you in the future.   Screening recommendations/referrals: Colonoscopy: completed 2019 Recommended yearly ophthalmology/optometry visit for glaucoma screening and checkup Recommended yearly dental visit for hygiene and checkup  Vaccinations: Influenza vaccine: done today  Pneumococcal vaccine: up to date Tdap vaccine: up to date Shingles vaccine: shingrix eligible     Advanced directives: copy on file  Conditions/risks identified: none   Next appointment: Follow up in one year for your annual wellness visit.   Preventive Care 64 Years and Older, Male Preventive care refers to lifestyle choices and visits with your health care provider that can promote health and wellness. What does preventive care include?  A yearly physical exam. This is also called an annual well check.  Dental exams once or twice a year.  Routine eye exams. Ask your health care provider how often you should have your eyes checked.  Personal lifestyle choices, including:  Daily care of your teeth and gums.  Regular physical activity.  Eating a healthy diet.  Avoiding tobacco and drug use.  Limiting alcohol use.  Practicing safe sex.  Taking low doses of aspirin every day.  Taking vitamin and mineral supplements as recommended by your health care provider. What happens during an annual well check? The services and screenings done by your health care provider during your annual well check will depend on your age, overall health, lifestyle risk factors, and family history of disease. Counseling  Your health care provider may ask you questions about your:  Alcohol use.  Tobacco use.  Drug use.  Emotional well-being.  Home and relationship well-being.  Sexual activity.  Eating  habits.  History of falls.  Memory and ability to understand (cognition).  Work and work Statistician. Screening  You may have the following tests or measurements:  Height, weight, and BMI.  Blood pressure.  Lipid and cholesterol levels. These may be checked every 5 years, or more frequently if you are over 48 years old.  Skin check.  Lung cancer screening. You may have this screening every year starting at age 9 if you have a 30-pack-year history of smoking and currently smoke or have quit within the past 15 years.  Fecal occult blood test (FOBT) of the stool. You may have this test every year starting at age 58.  Flexible sigmoidoscopy or colonoscopy. You may have a sigmoidoscopy every 5 years or a colonoscopy every 10 years starting at age 60.  Prostate cancer screening. Recommendations will vary depending on your family history and other risks.  Hepatitis C blood test.  Hepatitis B blood test.  Sexually transmitted disease (STD) testing.  Diabetes screening. This is done by checking your blood sugar (glucose) after you have not eaten for a while (fasting). You may have this done every 1-3 years.  Abdominal aortic aneurysm (AAA) screening. You may need this if you are a current or former smoker.  Osteoporosis. You may be screened starting at age 48 if you are at high risk. Talk with your health care provider about your test results, treatment options, and if necessary, the need for more tests. Vaccines  Your health care provider may recommend certain vaccines, such as:  Influenza vaccine. This is recommended every year.  Tetanus, diphtheria, and acellular pertussis (Tdap, Td) vaccine. You may need a Td  booster every 10 years.  Zoster vaccine. You may need this after age 11.  Pneumococcal 13-valent conjugate (PCV13) vaccine. One dose is recommended after age 6.  Pneumococcal polysaccharide (PPSV23) vaccine. One dose is recommended after age 28. Talk to your health  care provider about which screenings and vaccines you need and how often you need them. This information is not intended to replace advice given to you by your health care provider. Make sure you discuss any questions you have with your health care provider. Document Released: 04/04/2015 Document Revised: 11/26/2015 Document Reviewed: 01/07/2015 Elsevier Interactive Patient Education  2017 [place] Prevention in the Home Falls can cause injuries. They can happen to people of all ages. There are many things you can do to make your home safe and to help prevent falls. What can I do on the outside of my home?  Regularly fix the edges of walkways and driveways and fix any cracks.  Remove anything that might make you trip as you walk through a door, such as a raised step or threshold.  Trim any bushes or trees on the path to your home.  Use bright outdoor lighting.  Clear any walking paths of anything that might make someone trip, such as rocks or tools.  Regularly check to see if handrails are loose or broken. Make sure that both sides of any steps have handrails.  Any raised decks and porches should have guardrails on the edges.  Have any leaves, snow, or ice cleared regularly.  Use sand or salt on walking paths during winter.  Clean up any spills in your garage right away. This includes oil or grease spills. What can I do in the bathroom?  Use night lights.  Install grab bars by the toilet and in the tub and shower. Do not use towel bars as grab bars.  Use non-skid mats or decals in the tub or shower.  If you need to sit down in the shower, use a plastic, non-slip stool.  Keep the floor dry. Clean up any water that spills on the floor as soon as it happens.  Remove soap buildup in the tub or shower regularly.  Attach bath mats securely with double-sided non-slip rug tape.  Do not have throw rugs and other things on the floor that can make you trip. What can I do  in the bedroom?  Use night lights.  Make sure that you have a light by your bed that is easy to reach.  Do not use any sheets or blankets that are too big for your bed. They should not hang down onto the floor.  Have a firm chair that has side arms. You can use this for support while you get dressed.  Do not have throw rugs and other things on the floor that can make you trip. What can I do in the kitchen?  Clean up any spills right away.  Avoid walking on wet floors.  Keep items that you use a lot in easy-to-reach places.  If you need to reach something above you, use a strong step stool that has a grab bar.  Keep electrical cords out of the way.  Do not use floor polish or wax that makes floors slippery. If you must use wax, use non-skid floor wax.  Do not have throw rugs and other things on the floor that can make you trip. What can I do with my stairs?  Do not leave any items on the stairs.  Make sure that there are handrails on both sides of the stairs and use them. Fix handrails that are broken or loose. Make sure that handrails are as long as the stairways.  Check any carpeting to make sure that it is firmly attached to the stairs. Fix any carpet that is loose or worn.  Avoid having throw rugs at the top or bottom of the stairs. If you do have throw rugs, attach them to the floor with carpet tape.  Make sure that you have a light switch at the top of the stairs and the bottom of the stairs. If you do not have them, ask someone to add them for you. What else can I do to help prevent falls?  Wear shoes that:  Do not have high heels.  Have rubber bottoms.  Are comfortable and fit you well.  Are closed at the toe. Do not wear sandals.  If you use a stepladder:  Make sure that it is fully opened. Do not climb a closed stepladder.  Make sure that both sides of the stepladder are locked into place.  Ask someone to hold it for you, if possible.  Clearly mark  and make sure that you can see:  Any grab bars or handrails.  First and last steps.  Where the edge of each step is.  Use tools that help you move around (mobility aids) if they are needed. These include:  Canes.  Walkers.  Scooters.  Crutches.  Turn on the lights when you go into a dark area. Replace any light bulbs as soon as they burn out.  Set up your furniture so you have a clear path. Avoid moving your furniture around.  If any of your floors are uneven, fix them.  If there are any pets around you, be aware of where they are.  Review your medicines with your doctor. Some medicines can make you feel dizzy. This can increase your chance of falling. Ask your doctor what other things that you can do to help prevent falls. This information is not intended to replace advice given to you by your health care provider. Make sure you discuss any questions you have with your health care provider. Document Released: 01/02/2009 Document Revised: 08/14/2015 Document Reviewed: 04/12/2014 Elsevier Interactive Patient Education  2017 [employer].

## 2018-11-20 NOTE — Progress Notes (Signed)
Subjective:   Luis Miller. is a 73 y.o. male who presents for Medicare Annual/Subsequent preventive examination.  Review of Systems:   Cardiac Risk Factors include: hypertension;male gender;advanced age (>74mn, >>96women);dyslipidemia     Objective:    Vitals: BP 100/64 (BP Location: Right Arm, Patient Position: Sitting, Cuff Size: Normal)   Pulse (!) 47   Temp 98.7 F (37.1 C) (Temporal)   Resp 16   Ht _0  (1.727 m)   Wt 198 lb 3.2 oz (89.9 kg)   BMI 30.14 kg/m   Body mass index is 30.14 kg/m.  Advanced Directives 11/20/2018 02/22/2018 02/08/2018 02/06/2018 11/11/2016 08/25/2015 08/25/2015  Does Patient Have a Medical Advance Directive? _1  Yes Yes  Type of Advance Directive Living will;Healthcare Power of [place] will [place] RiversideLiving will - HLa F[place]Living will [place]Living will [place]Living will  Does patient want to make changes to medical advance directive? - No - Patient declined - - - No - Patient declined -  Copy of HBridgetonin Chart? Yes - validated most recent copy scanned in chart (See row information) No - copy requested - - Yes Yes Yes  Some encounter information is confidential and restricted. Go to Review Flowsheets activity to see all data.    Tobacco Social History   Tobacco Use  Smoking Status Former Smoker  . Types: Cigarettes  . Quit date: 09/08/1972  . Years since quitting: 46.2  Smokeless Tobacco Never Used     Counseling given: Not Answered   Clinical Intake:  Pre-visit preparation completed: Yes  Pain : No/denies pain     Nutritional Risks: None Diabetes: No  How often do you need to have someone help you when you read instructions, pamphlets, or other written materials from your doctor or pharmacy?: 1 - Never  Interpreter Needed?: No  Information entered by :: TTyler Aas  LPN  Past Medical History:  Diagnosis Date  . Anemia   . Apnea, sleep   . Arthritis   . Bipolar affective ([place]   . Cancer ([place]rat[place] malignant melanoma  . CRI (chronic renal insufficiency)   . Crohn's disease (HNortheast Ithaca   . Depression   . Diabetes insipidus (HHaliimaile   . Erectile dysfunction   . Gout   . Hyperlipidemia   . Hypertension   . IBS (irritable bowel syndrome)   . Impotence   . Internal hemorrhoids   . 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[place]nelly Springs   . Paraphimosis   . Peyronie disease   . Secondary hyperparathyroidism of renal origin (HNeponset   . Skin cancer   . Testicular hypofunction   . Urinary frequency   . Urinary hesitancy    Past Surgical History:  Procedure Laterality Date  . arm fracture    . BONE MARROW BIOPSY    . COLONOSCOPY WITH PROPOFOL N/A 12/23/2014   Procedure: COLONOSCOPY WITH PROPOFOL;  Surgeon: RManya Silvas MD;  [facility];  Service: Endoscopy;  Laterality: N/A;  . COLONOSCOPY WITH PROPOFOL N/A 02/06/2018   Procedure: COLONOSCOPY WITH PROPOFOL;  Surgeon: EManya Silvas MD;  [facility];  Service: Endoscopy;  Laterality: N/A;  . KNEE ARTHROPLASTY Left 02/22/2018   Procedure: COMPUTER ASSISTED TOTAL KNEE ARTHROPLASTY;  Surgeon: HDereck Leep MD;  Location: [facility];  Service: Orthopedics;  Laterality: Left;  .Marland KitchenMELANOMA EXCISION    . PENILE PROSTHESIS IMPLANT    .  PENILE PROSTHESIS IMPLANT N/A 08/25/2015   Procedure: REPLACEMENT OF INFLATABLE PENILE PROSTHESIS COMPONENTS;  Surgeon: Cleon Gustin, MD;  Location: [facility];  Service: Urology;  Laterality: N/A;  . SKIN CANCER EXCISION     nose  . TONSILLECTOMY    . VASECTOMY     Family History  Problem Relation Age of Onset  . Depression Sister   . Bipolar disorder Other   . Prostate cancer Neg Hx   . Kidney cancer Neg Hx   . Bladder Cancer Neg Hx    Social History   Socioeconomic History  . Marital status: Divorced    Spouse name: Not on file  . Number of children: 2   . Years of education: Not on file  . Highest education level: High school graduate  Occupational History  . Occupation: retired  Scientific laboratory technician  . Financial resource strain: Not hard at all  . Food insecurity    Worry: Never true    Inability: Never true  . Transportation needs    Medical: No    Non-medical: No  Tobacco Use  . Smoking status: Former Smoker    Types: Cigarettes    Quit date: 09/08/1972    Years since quitting: 46.2  . Smokeless tobacco: Never Used  Substance and Sexual Activity  . Alcohol use: Not Currently    Alcohol/week: 0.0 standard drinks    Comment: Occasional glass of wine  . Drug use: No  . Sexual activity: Yes    Partners: Female    Birth control/protection: None  Lifestyle  . Physical activity    Days per week: 0 days    Minutes per session: 0 min  . Stress: Not at all  Relationships  . Social connections    Talks on phone: More than three times a week    Gets together: Twice a week    Attends religious service: More than 4 times per year    Active member of club or organization: Yes    Attends meetings of clubs or organizations: More than 4 times per year    Relationship status: Divorced  Other Topics Concern  . Not on file  Social History Narrative  . Not on file    Outpatient Encounter Medications as of 11/20/2018  Medication Sig  . allopurinol (ZYLOPRIM) 100 MG tablet Take 200 mg by mouth daily.   Marland Kitchen aMILoride (MIDAMOR) 5 MG tablet Take 1 tablet (5 mg total) by mouth daily.  Marland Kitchen amLODipine (NORVASC) 10 MG tablet Take 1 tablet (10 mg total) by mouth daily.  . benazepril (LOTENSIN) 40 MG tablet Take 40 mg by mouth daily.   . hydrALAZINE (APRESOLINE) 100 MG tablet Take 100 mg by mouth 3 (three) times daily.  Marland Kitchen lithium carbonate (LITHOBID) 300 MG CR tablet Take 1 tablet (300 mg total) by mouth at bedtime.  . Multiple Vitamin (MULTIVITAMIN) tablet Take 1 tablet by mouth daily.  . QUEtiapine (SEROQUEL) 25 MG tablet Take 1 tablet (25 mg total) by  mouth at bedtime.  . traZODone (DESYREL) 50 MG tablet Take 1 tablet (50 mg total) by mouth at bedtime as needed.  . Vitamin D, Cholecalciferol, 25 MCG (1000 UT) TABS Take 1 tablet by mouth daily.   . cyproheptadine (PERIACTIN) 4 MG tablet Take one tablet three hours prior to intercourse (Patient not taking: Reported on 11/20/2018)  . loperamide (IMODIUM) 2 MG capsule Take 2 mg by mouth as needed for diarrhea or loose stools.  . [DISCONTINUED] calcitRIOL (ROCALTROL) 0.25 MCG  capsule Take 0.25 mcg by mouth daily.  . [DISCONTINUED] OVER THE COUNTER MEDICATION Apply 1 application topically daily as needed (DMSO Cream for knee pain).   No facility-administered encounter medications on file as of 11/20/2018.     Activities of Daily Living In your present state of health, do you have any difficulty performing the following activities: 11/20/2018 02/22/2018  Hearing? Y -  Comment bilateral hearing aids -  Vision? N -  Comment eyeglasses and contacts , goes to [facility] -  Difficulty concentrating or making decisions? Y -  Comment typically comes back -  Walking or climbing stairs? N -  Dressing or bathing? N -  Doing errands, shopping? N N  Preparing Food and eating ? N -  Using the Toilet? N -  In the past six months, have you accidently leaked urine? Y -  Do you have problems with loss of bowel control? N -  Comment has IBS -  Managing your Medications? N -  Managing your Finances? N -  Housekeeping or managing your Housekeeping? N -  Some recent data might be hidden    Patient Care Team: Guadalupe Maple, MD as PCP - General (Family Medicine) Anthonette Legato, MD (Internal Medicine) Laneta Simmers as Physician Assistant (Urology) Hooten, Laurice Record, MD (Orthopedic Surgery) Rainey Pines, MD as Consulting Physician (Psychiatry) Oneta Rack, MD (Dermatology)   Assessment:   This is a routine wellness examination for Ishan.  Exercise Activities and Dietary  recommendations Current Exercise Habits: Home exercise routine, Type of exercise: strength training/weights(knee exercises), Time (Minutes): 10, Intensity: Mild, Exercise limited by: None identified  Goals    . DIET - INCREASE WATER INTAKE     Recommend drinking at least 6-8 glasses of water a day        Fall Risk: Fall Risk  11/20/2018 04/24/2018 11/14/2017 10/03/2017 04/05/2017  Falls in the past year? 0 0 No No No  Comment - - - - -  Follow up - Falls evaluation completed - - -    FALL RISK PREVENTION PERTAINING TO THE HOME:  Any stairs in or around the home? No  If so, are there any without handrails? No   Home free of loose throw rugs in walkways, pet beds, electrical cords, etc? Yes  Adequate lighting in your home to reduce risk of falls? Yes   ASSISTIVE DEVICES UTILIZED TO PREVENT FALLS:  Life alert? No  Use of a cane, walker or w/c? No  Grab bars in the bathroom? No  Shower chair or bench in shower? No  Elevated toilet seat or a handicapped toilet? No   TIMED UP AND GO:  Was the test performed? Yes .  Length of time to ambulate 10 feet: 10 sec.   GAIT:  Appearance of gait: Gait steady and fast without the use of an assistive device.  Intervention(s) required? No  DME/home health order needed?  No   Depression Screen PHQ 2/9 Scores 11/20/2018 04/24/2018 11/14/2017 04/05/2017  PHQ - 2 Score 0 0 0 0  PHQ- 9 Score - 3 - -    Cognitive Function MMSE - Mini Mental State Exam 02/23/2017  Orientation to time 5  Orientation to Place 5  Registration 3  Attention/ Calculation 4  Recall 2  Language- name 2 objects 2  Language- repeat 1  Language- follow 3 step command 3  Language- read & follow direction 1  Write a sentence 1  Copy design 1  Total score 28  6CIT Screen 11/20/2018 11/14/2017 11/11/2016  What Year? 0 points 0 points 0 points  What month? 0 points 0 points 0 points  What time? 0 points 0 points 0 points  Count back from 20 0 points 0 points 0 points   Months in reverse 0 points 0 points 0 points  Repeat phrase 2 points 2 points 0 points  Total Score 2 2 0    Immunization History  Administered Date(s) Administered  . Fluad Quad(high Dose 65+) 11/20/2018  . Influenza, High Dose Seasonal PF 01/02/2016, 01/12/2017, 12/22/2017  . Influenza,inj,Quad PF,6+ Mos 05/13/2015  . Influenza-Unspecified 02/27/2014  . Pneumococcal Conjugate-13 03/20/2014  . Pneumococcal-Unspecified 11/05/2004, 04/29/2009  . Td 08/12/2011  . Zoster 10/07/2009    Qualifies for Shingles Vaccine? Yes  Zostavax completed n/a. Due for Shingrix. Education has been provided regarding the importance of this vaccine. Pt has been advised to call insurance company to determine out of pocket expense. Advised may also receive vaccine at local pharmacy or Health Dept. Verbalized acceptance and understanding.  Tdap: up to date   Flu Vaccine: done today   Pneumococcal Vaccine: up to date   Screening Tests Health Maintenance  Topic Date Due  . INFLUENZA VACCINE  10/21/2018  . Hepatitis C Screening  11/20/2019 (Originally 25-Mar-1945)  . TETANUS/TDAP  08/11/2021  . COLONOSCOPY  02/07/2023   Cancer Screenings:  Colorectal Screening: Completed 02/06/2018. Repeat every 5 years   Lung Cancer Screening: (Low Dose CT Chest recommended if Age 28-80 years, 30 pack-year currently smoking OR have quit w/in 15years.) does not qualify.    Additional Screening:  Hepatitis C Screening: does qualify  Vision Screening: Recommended annual ophthalmology exams for early detection of glaucoma and other disorders of the eye. Is the patient up to date with their annual eye exam?  Yes  Who is the provider or what is the name of the office in which the pt attends annual eye exams? [facility]   Dental Screening: Recommended annual dental exams for proper oral hygiene  Community Resource Referral:  CRR required this visit?  No        Plan:  I have personally reviewed and addressed  the Medicare Annual Wellness questionnaire and have noted the following in the patient's chart:  A. Medical and social history B. Use of alcohol, tobacco or illicit drugs  C. Current medications and supplements D. Functional ability and status E.  Nutritional status F.  Physical activity G. Advance directives H. List of other physicians I.  Hospitalizations, surgeries, and ER visits in previous 12 months J.  [place] such as hearing and vision if needed, cognitive and depression L. Referrals and appointments   In addition, I have reviewed and discussed with patient certain preventive protocols, quality metrics, and best practice recommendations. A written personalized care plan for preventive services as well as general preventive health recommendations were provided to patient.   Signed,   Bevelyn Ngo, LPN  1/61/0960 Nurse Health Advisor   Nurse Notes: none

## 2018-11-21 ENCOUNTER — Ambulatory Visit: Payer: Self-pay | Admitting: *Deleted

## 2018-11-21 NOTE — Telephone Encounter (Signed)
Immunization given by Tyler Aas, LPN.   Routing to her to see if she can help assist patient and since he would only like to talk to her.

## 2018-11-21 NOTE — Telephone Encounter (Signed)
Pt called in requesting to talk with Tiffany.   "She's the one that gave me the shot".   He is c/o his right arm being very sore from the shot.   I asked if I could help him so I went over the protocol for reactions, soreness, etc with him.   I reassured him that it's not usual to be sore after receiving a vaccine even if you've never been sore before.    It could be the difference in the composition of the vaccine this year.   Every year it is different depending on the strain going around.    He felt better after I talked with him however he still wants Tiffany to call him back. I let him know I would pass the message along.    He mentioned he called earlier for Tiffany and someone said it would be 2 hours before she could call me back.  I let him know she may be busy with patients however I would pass his message to her.  He was agreeable to this.    Please call him on his cell phone [phone].  I sent my notes to the office.    Reason for Disposition . Influenza (TIV; Injection) injected vaccine reactions  Answer Assessment - Initial Assessment Questions 1. SYMPTOMS: "What is the main symptom?" (e.g., redness, swelling, pain)      My right arm is sore from my flu shot I got on Monday. 2. ONSET: "When was the vaccine (shot) given?" "How much later did the Monday begin?" (e.g., hours, days ago)      *No Answer* 3. SEVERITY: "How bad is it?"      My right arm is really sore and I've never got so sore after a flu shot before. 4. FEVER: "Is there a fever?" If so, ask: "What is it, how was it measured, and when did it start?"      No 5. IMMUNIZATIONS GIVEN: "What shots have you recently received?"     Flu shot on Monday in my right ar. 6. PAST REACTIONS: "Have you reacted to immunizations before?" If so, ask: "What happened?"     No 7. OTHER SYMPTOMS: "Do you have any other symptoms?"     No just my arm is really sore.  Protocols used: IMMUNIZATION REACTIONS-A-AH

## 2018-11-22 ENCOUNTER — Telehealth: Payer: Self-pay

## 2018-11-22 NOTE — Telephone Encounter (Signed)
Called patient back, he states his arm is felling better today. Just wanted to make sure the medication that was normal for it to be sore as he has never had soreness from it before. Informed patient due to the medication it can cause some soreness in that muscle. patient declined any redness or or inflammation at injection site. Informed patient to call if it worsens. Pt verbalized understanding and felt better after talking.

## 2018-11-22 NOTE — Telephone Encounter (Signed)
Called patient back- no answer. Left message for patient to call back with direct number.

## 2018-12-01 DIAGNOSIS — D631 Anemia in chronic kidney disease: Secondary | ICD-10-CM | POA: Diagnosis not present

## 2018-12-01 DIAGNOSIS — I10 Essential (primary) hypertension: Secondary | ICD-10-CM | POA: Diagnosis not present

## 2018-12-01 DIAGNOSIS — R809 Proteinuria, unspecified: Secondary | ICD-10-CM | POA: Diagnosis not present

## 2018-12-01 DIAGNOSIS — N2581 Secondary hyperparathyroidism of renal origin: Secondary | ICD-10-CM | POA: Diagnosis not present

## 2018-12-01 DIAGNOSIS — N184 Chronic kidney disease, stage 4 (severe): Secondary | ICD-10-CM | POA: Diagnosis not present

## 2019-01-08 ENCOUNTER — Ambulatory Visit (INDEPENDENT_AMBULATORY_CARE_PROVIDER_SITE_OTHER): Payer: Medicare Other | Admitting: Psychiatry

## 2019-01-08 ENCOUNTER — Ambulatory Visit: Payer: Medicare Other | Admitting: Psychiatry

## 2019-01-08 ENCOUNTER — Other Ambulatory Visit: Payer: Self-pay

## 2019-01-08 ENCOUNTER — Encounter: Payer: Self-pay | Admitting: Psychiatry

## 2019-01-08 DIAGNOSIS — F3176 Bipolar disorder, in full remission, most recent episode depressed: Secondary | ICD-10-CM

## 2019-01-08 MED ORDER — LITHIUM CARBONATE ER 300 MG PO TBCR: 300.0000 mg | EXTENDED_RELEASE_TABLET | Freq: Every day | ORAL | 0 refills | Status: DC

## 2019-01-08 MED ORDER — QUETIAPINE FUMARATE 25 MG PO TABS: 25.0000 mg | ORAL_TABLET | Freq: Every day | ORAL | 0 refills | Status: DC

## 2019-01-08 MED ORDER — TRAZODONE HCL 50 MG PO TABS: 50.0000 mg | ORAL_TABLET | Freq: Every evening | ORAL | 0 refills | Status: DC | PRN

## 2019-01-08 NOTE — Progress Notes (Signed)
BH MD/PA/NP OP Progress Note  I connected with  Luis Coss. on 01/08/19 by phone and verified that I am speaking with the correct person using two identifiers.   I discussed the limitations of evaluation and management by phone. The patient expressed understanding and agreed to proceed.   01/08/2019 9:46 AM Luis Coss.  MRN:  [id number]  Chief Complaint:  " I am doing fine."  HPI:  Pt is a 73 y/o male with long hx of Bipolar disorder with Chronic kidney disease followed by nephrology. Pt has been maintained on Lithium for many years.   Latest CMP done on 12/01/2018 reviewed, noted that creatinine is up at 3.31 with GFR at 17. His last CMP was done on 04/24/2018 and at that time creatinine was at 2.88 and GFR was 21.  Last lithium level was doe on 2/32020- 0.7.  I discussed all the lab results with the patient in detail. Pt was asked if this was a time to consider discontinuation of Lithium. Pt stated that he ends up in the hospital due to resurgence of depressive symptoms each time he is taken off of the Lithium. He informed that this has been tried several times in the past. He stated he would like to continue his current regimen of lithium 300 mg HS, Seroquel 25 mg HS, Trazodone 50 mg HS as he is fine mentally. He stated that he is worried about having to go on dialysis and is working closely with his nephrologist regarding this.  He denied any other issues or concerns. He denied any suicidal or homicidal ideations.  He did verbalize that he would prefer to see some one face to face in [facility] Psychiatry clinic once that restarts.  Visit Diagnosis: Bipolar disorder, in full remission, most recent episode depressed [facility])   Past Psychiatric History: Bipolar disorder, in full remission, most recent episode depressed [facility])    Past Medical History:  Past Medical History:  Diagnosis Date  . Anemia   . Apnea, sleep   . Arthritis   . Bipolar affective ([place])   . Cancer  ([place]) malignant melanoma  . CRI (chronic renal insufficiency)   . Crohn's disease ([place])   . Depression   . Diabetes insipidus ([place])   . Erectile dysfunction   . Gout   . Hyperlipidemia   . Hypertension   . IBS (irritable bowel syndrome)   . Impotence   . Internal hemorrhoids   . 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[place])   . Paraphimosis   . Peyronie disease   . Secondary hyperparathyroidism of renal origin ([place])   . Skin cancer   . Testicular hypofunction   . Urinary frequency   . Urinary hesitancy     Past Surgical History:  Procedure Laterality Date  . arm fracture    . BONE MARROW BIOPSY    . COLONOSCOPY WITH PROPOFOL N/A 12/23/2014   Procedure: COLONOSCOPY WITH PROPOFOL;  Surgeon: Manya Silvas, MD;  [facility] ENDOSCOPY;  Service: Endoscopy;  Laterality: N/A;  . COLONOSCOPY WITH PROPOFOL N/A 02/06/2018   Procedure: COLONOSCOPY WITH PROPOFOL;  Surgeon: Manya Silvas, MD;  Location: [facility] ENDOSCOPY;  Service: Endoscopy;  Laterality: N/A;  . KNEE ARTHROPLASTY Left 02/22/2018   Procedure: COMPUTER ASSISTED TOTAL KNEE ARTHROPLASTY;  Surgeon: Dereck Leep, MD;  Location: [facility] ORS;  Service: Orthopedics;  Laterality: Left;  Marland Kitchen MELANOMA EXCISION    . PENILE PROSTHESIS IMPLANT    . PENILE PROSTHESIS IMPLANT N/A 08/25/2015   Procedure:  REPLACEMENT OF INFLATABLE PENILE PROSTHESIS COMPONENTS;  Surgeon: Cleon Gustin, MD;  Location: [facility] ORS;  Service: Urology;  Laterality: N/A;  . SKIN CANCER EXCISION     nose  . TONSILLECTOMY    . VASECTOMY       Family History:  Family History  Problem Relation Age of Onset  . Depression Sister   . Bipolar disorder Other   . Prostate cancer Neg Hx   . Kidney cancer Neg Hx   . Bladder Cancer Neg Hx     Social History:  Social History   Socioeconomic History  . Marital status: Divorced    Spouse name: Not on file  . Number of children: 2  . Years of education: Not on file  . Highest education level: High school graduate   Occupational History  . Occupation: retired  Scientific laboratory technician  . Financial resource strain: Not hard at all  . Food insecurity    Worry: Never true    Inability: Never true  . Transportation needs    Medical: No    Non-medical: No  Tobacco Use  . Smoking status: Former Smoker    Types: Cigarettes    Quit date: 09/08/1972    Years since quitting: 46.3  . Smokeless tobacco: Never Used  Substance and Sexual Activity  . Alcohol use: Not Currently    Alcohol/week: 0.0 standard drinks    Comment: Occasional glass of wine  . Drug use: No  . Sexual activity: Yes    Partners: Female    Birth control/protection: None  Lifestyle  . Physical activity    Days per week: 0 days    Minutes per session: 0 min  . Stress: Not at all  Relationships  . Social connections    Talks on phone: More than three times a week    Gets together: Twice a week    Attends religious service: More than 4 times per year    Active member of club or organization: Yes    Attends meetings of clubs or organizations: More than 4 times per year    Relationship status: Divorced  Other Topics Concern  . Not on file  Social History Narrative  . Not on file    Allergies:  Allergies  Allergen Reactions  . Mirtazapine Rash  . Olanzapine Rash  . Aspirin     Other reaction(s): Unknown Patient was instructed not to take aspirin but can not remember why  . Atomoxetine Other (See Comments)    Urinary sx  . Strattera [Atomoxetine Hcl] Other (See Comments)    Urinary sx  . Xyzal [Levocetirizine Dihydrochloride] Rash    Patient reported on 7/90/24    Metabolic Disorder Labs: No results found for: HGBA1C, MPG No results found for: PROLACTIN Lab Results  Component Value Date   CHOL 168 04/24/2018   TRIG 144 04/24/2018   HDL 43 04/24/2018   CHOLHDL 3.9 04/24/2018   VLDL 50 (H) 10/03/2017   LDLCALC 96 04/24/2018   LDLCALC 111 (H) 04/05/2017   Lab Results  Component Value Date   TSH 0.508 04/24/2018   TSH  0.213 (L) 04/05/2017    Therapeutic Level Labs: Lab Results  Component Value Date   LITHIUM 0.7 04/24/2018   LITHIUM 0.7 06/07/2016   No results found for: VALPROATE No components found for:  CBMZ  Current Medications: Current Outpatient Medications  Medication Sig Dispense Refill  . allopurinol (ZYLOPRIM) 100 MG tablet Take 200 mg by mouth daily.     Marland Kitchen  aMILoride (MIDAMOR) 5 MG tablet Take 1 tablet (5 mg total) by mouth daily. 90 tablet 4  . amLODipine (NORVASC) 10 MG tablet Take 1 tablet (10 mg total) by mouth daily. 90 tablet 1  . benazepril (LOTENSIN) 40 MG tablet Take 40 mg by mouth daily.     . cyproheptadine (PERIACTIN) 4 MG tablet Take one tablet three hours prior to intercourse (Patient not taking: Reported on 11/20/2018) 90 tablet 3  . hydrALAZINE (APRESOLINE) 100 MG tablet Take 100 mg by mouth 3 (three) times daily.    Marland Kitchen lithium carbonate (LITHOBID) 300 MG CR tablet Take 1 tablet (300 mg total) by mouth at bedtime. 90 tablet 1  . loperamide (IMODIUM) 2 MG capsule Take 2 mg by mouth as needed for diarrhea or loose stools.    . Multiple Vitamin (MULTIVITAMIN) tablet Take 1 tablet by mouth daily.    . QUEtiapine (SEROQUEL) 25 MG tablet Take 1 tablet (25 mg total) by mouth at bedtime. 90 tablet 1  . traZODone (DESYREL) 50 MG tablet Take 1 tablet (50 mg total) by mouth at bedtime as needed. 90 tablet 1  . Vitamin D, Cholecalciferol, 25 MCG (1000 UT) TABS Take 1 tablet by mouth daily.      No current facility-administered medications for this visit.      Musculoskeletal: Strength & Muscle Tone: Unable to assess due to phone visit Gait & Station: Unable to assess due to phone visit Patient leans: Unable to assess due to phone visit  Psychiatric Specialty Exam: ROS  There were no vitals taken for this visit.There is no height or weight on file to calculate BMI.  General Appearance: Unable to assess due to phone visit  Eye Contact:  Unable to assess due to phone visit   Speech:  Clear and Coherent and Normal Rate  Volume:  Normal  Mood:  Euthymic  Affect:  Appropriate  Thought Process:  Goal Directed, Linear and Descriptions of Associations: Intact  Orientation:  Full (Time, Place, and Person)  Thought Content: Logical   Suicidal Thoughts:  No  Homicidal Thoughts:  No  Memory:  Recent;   Good Remote;   Good  Judgement:  Fair  Insight:  Fair  Psychomotor Activity:  Unable to assess due to phone visit  Concentration:  Concentration: Good and Attention Span: Good  Recall:  Good  Fund of Knowledge: Good  Language: Good  Akathisia:  Negative  Handed:  Right  AIMS (if indicated): Unable to assess due to phone visit  Assets:  Communication Skills Desire for Improvement Financial Resources/Insurance Social Support Transportation  ADL's:  Intact  Cognition: WNL  Sleep:  Good   Screenings: Mini-Mental     Office Visit from 02/23/2017 in [facility]  Total Score (max 30 points )  28    PHQ2-9     Clinical Support from 11/20/2018 in [place] Visit from 04/24/2018 in [place] from 11/14/2017 in [place] Visit from 04/05/2017 in [place] Visit from 02/23/2017 in [place]  PHQ-2 Total Score  0  0  0  0  0  PHQ-9 Total Score  -  3  -  -  -       Assessment and Plan: 73 y/o male with hx of bipolar disorder maintained on Lithium for many years. His lab results were reviewed and discussed in detail with the patient. Pt would like to continue his current medication combination and discuss  with his nephrologist regarding any recommendations.  1. Bipolar disorder, in full remission, most recent episode depressed ([place]) - Continue lithium 300 mg HS for now - Continue Seroquel 25 mg HS - Continue Trazodone 50 mg HS  Pt has follow up appointment with Dr. Holley Raring, Nephrologist on 02/02/2019.  F/up in 3 months. Pt would prefer that he  sees some one face to face in [facility] psychiatry clinic once we are over with virtual visits. He said he will call the clinic to schedule an appointment by himself.  [place] Crane, MD 01/08/2019, 9:46 AM

## 2019-01-18 ENCOUNTER — Other Ambulatory Visit: Payer: Self-pay

## 2019-01-22 ENCOUNTER — Telehealth: Payer: Self-pay

## 2019-01-22 NOTE — Telephone Encounter (Signed)
pt called left message that the quetiapine will cost him alot of money and insurance states that he can try clonidine and it would be alot cheaper.

## 2019-01-23 ENCOUNTER — Telehealth: Payer: Self-pay

## 2019-01-23 NOTE — Telephone Encounter (Signed)
Is there anything else he can try in the place of the seroquel. He states that he has enough seroquel to do for 90 days but the next time he has to get refilled it will cost him $107.00 and he doesn't want to spend that kind of money on medication.  Would you like for him to just use the seroquel he already has and then work something out at his next visit.?   Patient also state that he needs to know when to come back to see you.

## 2019-01-23 NOTE — Telephone Encounter (Signed)
Please inform him that Clonidine is not a replacement option for Seroquel.  Ask him if he would like to continue with the regimen of Lithium for mood stabilization and Trazodone for sleep. If he says yes, then he can continue with Lithium and Trazodone and we can safely discontinue Seroquel which is a very low dose.  Thanks.

## 2019-01-24 NOTE — Telephone Encounter (Signed)
Please offer him appointment on December 9 at 11 am to discuss other medication options. He may continue Seroquel in the mean time and at the time of next visit we can discuss other medications. Please explain to him that the appt will still be by virtual means as he was not too happy regarding that at the time of his last appt and therefore had stated that he will call to reschedule the appt by himself. That is the reason why he does not have a follow up appt scheduled.  Thanks.

## 2019-02-01 DIAGNOSIS — I10 Essential (primary) hypertension: Secondary | ICD-10-CM | POA: Insufficient documentation

## 2019-02-01 DIAGNOSIS — E1159 Type 2 diabetes mellitus with other circulatory complications: Secondary | ICD-10-CM | POA: Insufficient documentation

## 2019-02-02 DIAGNOSIS — R809 Proteinuria, unspecified: Secondary | ICD-10-CM | POA: Diagnosis not present

## 2019-02-02 DIAGNOSIS — N2581 Secondary hyperparathyroidism of renal origin: Secondary | ICD-10-CM | POA: Diagnosis not present

## 2019-02-02 DIAGNOSIS — D631 Anemia in chronic kidney disease: Secondary | ICD-10-CM | POA: Diagnosis not present

## 2019-02-02 DIAGNOSIS — I10 Essential (primary) hypertension: Secondary | ICD-10-CM | POA: Diagnosis not present

## 2019-02-02 DIAGNOSIS — N184 Chronic kidney disease, stage 4 (severe): Secondary | ICD-10-CM | POA: Diagnosis not present

## 2019-02-19 ENCOUNTER — Encounter: Payer: Self-pay | Admitting: Psychiatry

## 2019-02-19 ENCOUNTER — Ambulatory Visit (INDEPENDENT_AMBULATORY_CARE_PROVIDER_SITE_OTHER): Payer: Medicare Other | Admitting: Psychiatry

## 2019-02-19 ENCOUNTER — Other Ambulatory Visit: Payer: Self-pay

## 2019-02-19 DIAGNOSIS — F3176 Bipolar disorder, in full remission, most recent episode depressed: Secondary | ICD-10-CM | POA: Diagnosis not present

## 2019-02-19 DIAGNOSIS — G4700 Insomnia, unspecified: Secondary | ICD-10-CM | POA: Diagnosis not present

## 2019-02-19 MED ORDER — TRAZODONE HCL 50 MG PO TABS: 50.0000 mg | ORAL_TABLET | Freq: Every evening | ORAL | 0 refills | Status: DC | PRN

## 2019-02-19 MED ORDER — LITHIUM CARBONATE ER 300 MG PO TBCR: 300.0000 mg | EXTENDED_RELEASE_TABLET | Freq: Every day | ORAL | 0 refills | Status: DC

## 2019-02-19 MED ORDER — OLANZAPINE 2.5 MG PO TABS: 2.5000 mg | ORAL_TABLET | Freq: Every day | ORAL | 0 refills | Status: DC

## 2019-02-19 NOTE — Progress Notes (Signed)
[facility] MD OP Progress Note  I connected with  Luis Coss. on 02/19/19 by a video enabled telemedicine application and verified that I am speaking with the correct person using two identifiers.   I discussed the limitations of evaluation and management by telemedicine. The patient expressed understanding and agreed to proceed.   02/19/2019 2:41 PM Luis Coss.  MRN:  [id number]  Chief Complaint:  " I am worried about having to pay a lot more for Seroquel now that it is a tier 3 per my insurance."  HPI: Patient reported that he is doing okay however he is worried that once he runs out of Seroquel he will not be able to afford it anymore as it is tier 3 as per his insurance.  He said tier 3 and implies co-pay of $117 per prescription which she cannot afford.  Patient was given 2 options.  First option was that he can try going without the Seroquel and if he cannot sleep then he can take 2 of trazodone tablets at night.  The other option was to replace Seroquel with olanzapine.  Patient was given time during the session to check his insurance booklet to see if olanzapine was covered by his insurance.  Patient checked that olanzapine was a tier 2 implying a co-pay of $7 per prescription so he went ahead and agreed for the writer to send a prescription for the same.  Patient remained focused on having to go without Seroquel.  Writer even suggested that he can try a different pharmacy as Seroquel is generic and he can get cheaper options from there.  However patient remained fixated on using the same pharmacy for all his medications.  Pt is under the care of nephrologist Dr. Holley Raring, was seen a few weeks ago. His Cr clearance is being monitored closely.  Visit Diagnosis:    ICD-10-CM   1. Bipolar disorder, in full remission, most recent episode depressed ([place])  F31.76   2. Insomnia disorder related to known organic factor  G47.00     Past Psychiatric History: Bipolar disorder  Past Medical  History:  Past Medical History:  Diagnosis Date  . Anemia   . Apnea, sleep   . Arthritis   . Bipolar affective ([place])   . Cancer ([place]) malignant melanoma  . CRI (chronic renal insufficiency)   . Crohn's disease ([place])   . Depression   . Diabetes insipidus ([place])   . Erectile dysfunction   . Gout   . Hyperlipidemia   . Hypertension   . IBS (irritable bowel syndrome)   . Impotence   . Internal hemorrhoids   . 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[place])   . Paraphimosis   . Peyronie disease   . Secondary hyperparathyroidism of renal origin ([place])   . Skin cancer   . Testicular hypofunction   . Urinary frequency   . Urinary hesitancy     Past Surgical History:  Procedure Laterality Date  . arm fracture    . BONE MARROW BIOPSY    . COLONOSCOPY WITH PROPOFOL N/A 12/23/2014   Procedure: COLONOSCOPY WITH PROPOFOL;  Surgeon: Manya Silvas, MD;  Location: [facility] ENDOSCOPY;  Service: Endoscopy;  Laterality: N/A;  . COLONOSCOPY WITH PROPOFOL N/A 02/06/2018   Procedure: COLONOSCOPY WITH PROPOFOL;  Surgeon: Manya Silvas, MD;  Location: [facility] ENDOSCOPY;  Service: Endoscopy;  Laterality: N/A;  . KNEE ARTHROPLASTY Left 02/22/2018   Procedure: COMPUTER ASSISTED TOTAL KNEE ARTHROPLASTY;  Surgeon: Dereck Leep, MD;  Location:  [facility] ORS;  Service: Orthopedics;  Laterality: Left;  Marland Kitchen MELANOMA EXCISION    . PENILE PROSTHESIS IMPLANT    . PENILE PROSTHESIS IMPLANT N/A 08/25/2015   Procedure: REPLACEMENT OF INFLATABLE PENILE PROSTHESIS COMPONENTS;  Surgeon: Cleon Gustin, MD;  Location: [facility] ORS;  Service: Urology;  Laterality: N/A;  . SKIN CANCER EXCISION     nose  . TONSILLECTOMY    . VASECTOMY      Family Psychiatric History: see below  Family History:  Family History  Problem Relation Age of Onset  . Depression Sister   . Bipolar disorder Other   . Prostate cancer Neg Hx   . Kidney cancer Neg Hx   . Bladder Cancer Neg Hx     Social History:  Social History   Socioeconomic History   . Marital status: Divorced    Spouse name: Not on file  . Number of children: 2  . Years of education: Not on file  . Highest education level: High school graduate  Occupational History  . Occupation: retired  Scientific laboratory technician  . Financial resource strain: Not hard at all  . Food insecurity    Worry: Never true    Inability: Never true  . Transportation needs    Medical: No    Non-medical: No  Tobacco Use  . Smoking status: Former Smoker    Types: Cigarettes    Quit date: 09/08/1972    Years since quitting: 46.4  . Smokeless tobacco: Never Used  Substance and Sexual Activity  . Alcohol use: Not Currently    Alcohol/week: 0.0 standard drinks    Comment: Occasional glass of wine  . Drug use: No  . Sexual activity: Yes    Partners: Female    Birth control/protection: None  Lifestyle  . Physical activity    Days per week: 0 days    Minutes per session: 0 min  . Stress: Not at all  Relationships  . Social connections    Talks on phone: More than three times a week    Gets together: Twice a week    Attends religious service: More than 4 times per year    Active member of club or organization: Yes    Attends meetings of clubs or organizations: More than 4 times per year    Relationship status: Divorced  Other Topics Concern  . Not on file  Social History Narrative  . Not on file    Allergies:  Allergies  Allergen Reactions  . Mirtazapine Rash  . Olanzapine Rash  . Aspirin     Other reaction(s): Unknown Patient was instructed not to take aspirin but can not remember why  . Atomoxetine Other (See Comments)    Urinary sx  . Strattera [Atomoxetine Hcl] Other (See Comments)    Urinary sx  . Xyzal [Levocetirizine Dihydrochloride] Rash    Patient reported on 6/73/41    Metabolic Disorder Labs: No results found for: HGBA1C, MPG No results found for: PROLACTIN Lab Results  Component Value Date   CHOL 168 04/24/2018   TRIG 144 04/24/2018   HDL 43 04/24/2018    CHOLHDL 3.9 04/24/2018   VLDL 50 (H) 10/03/2017   LDLCALC 96 04/24/2018   LDLCALC 111 (H) 04/05/2017   Lab Results  Component Value Date   TSH 0.508 04/24/2018   TSH 0.213 (L) 04/05/2017    Therapeutic Level Labs: Lab Results  Component Value Date   LITHIUM 0.7 04/24/2018   LITHIUM 0.7 06/07/2016   No results found  for: VALPROATE No components found for:  CBMZ  Current Medications: Current Outpatient Medications  Medication Sig Dispense Refill  . allopurinol (ZYLOPRIM) 100 MG tablet Take 200 mg by mouth daily.     Marland Kitchen aMILoride (MIDAMOR) 5 MG tablet Take 1 tablet (5 mg total) by mouth daily. 90 tablet 4  . amLODipine (NORVASC) 10 MG tablet Take 1 tablet (10 mg total) by mouth daily. 90 tablet 1  . benazepril (LOTENSIN) 40 MG tablet Take 40 mg by mouth daily.     . cyproheptadine (PERIACTIN) 4 MG tablet Take one tablet three hours prior to intercourse (Patient not taking: Reported on 11/20/2018) 90 tablet 3  . hydrALAZINE (APRESOLINE) 100 MG tablet Take 100 mg by mouth 3 (three) times daily.    Marland Kitchen lithium carbonate (LITHOBID) 300 MG CR tablet Take 1 tablet (300 mg total) by mouth at bedtime. 90 tablet 0  . loperamide (IMODIUM) 2 MG capsule Take 2 mg by mouth as needed for diarrhea or loose stools.    . Multiple Vitamin (MULTIVITAMIN) tablet Take 1 tablet by mouth daily.    . QUEtiapine (SEROQUEL) 25 MG tablet Take 1 tablet (25 mg total) by mouth at bedtime. 90 tablet 0  . traZODone (DESYREL) 50 MG tablet Take 1 tablet (50 mg total) by mouth at bedtime as needed. 90 tablet 0  . Vitamin D, Cholecalciferol, 25 MCG (1000 UT) TABS Take 1 tablet by mouth daily.      No current facility-administered medications for this visit.       Psychiatric Specialty Exam: ROS  There were no vitals taken for this visit.There is no height or weight on file to calculate BMI.  General Appearance: unable to assess due to phone visit  Eye Contact:  Unable to assess due to phone visit  Speech:  Clear  and Coherent and Normal Rate  Volume:  Normal  Mood:  Worried about seroquel  Affect:  Congruent  Thought Process:  Goal Directed, Linear and Descriptions of Associations: Intact  Orientation:  Full (Time, Place, and Person)  Thought Content: Logical, Rumination and fixated on seroquel   Suicidal Thoughts:  No  Homicidal Thoughts:  No  Memory:  Recent;   Good Remote;   Good  Judgement:  Fair  Insight:  Fair  Psychomotor Activity:  Normal  Concentration:  Concentration: Good and Attention Span: Fair  Recall:  Good  Fund of Knowledge: Good  Language: Good  Akathisia:  Negative  Handed:  Right  AIMS (if indicated): not done due to phone visit  Assets:  Communication Skills Desire for Improvement Financial Resources/Insurance  ADL's:  Intact  Cognition: WNL  Sleep:  Good   Screenings: Mini-Mental     [facility]  Total Score (max 30 points )  28    PHQ2-9     Clinical Support from 11/20/2018 in [place] Visit from 04/24/2018 in [place] from 11/14/2017 in [place] Visit from 04/05/2017 in [place] Visit from 02/23/2017 in [place]  PHQ-2 Total Score  0  0  0  0  0  PHQ-9 Total Score  -  3  -  -  -       Assessment and Plan: 73 year old male who has been on lithium and Seroquel for many years.  Patient is now stressed as he will no longer be able to afford Seroquel due to insurance changes and therefore wants  to know his options.  Patient was recommended to try without Seroquel and if needed try higher dose of trazodone for sleep.  However if he does not do well without the Seroquel he was recommended trial of olanzapine 2.5 mg at bedtime which he confirmed is covered by his insurance.  1. Bipolar disorder, in full remission, most recent episode depressed ([place])  - lithium carbonate (LITHOBID) 300 MG CR tablet; Take 1 tablet  (300 mg total) by mouth at bedtime.  Dispense: 90 tablet; Refill: 0 - traZODone (DESYREL) 50 MG tablet; Take 1 tablet (50 mg total) by mouth at bedtime as needed.  Dispense: 90 tablet; Refill: 0 - Start OLANZapine (ZYPREXA) 2.5 MG tablet; Take 1 tablet (2.5 mg total) by mouth at bedtime.  Dispense: 90 tablet; Refill: 0  2. Insomnia disorder related to known organic factor  - traZODone (DESYREL) 50 MG tablet; Take 1 tablet (50 mg total) by mouth at bedtime as needed.  Dispense: 90 tablet; Refill: 0 - Start OLANZapine (ZYPREXA) 2.5 MG tablet; Take 1 tablet (2.5 mg total) by mouth at bedtime.  Dispense: 90 tablet; Refill: 0  F/up in 2 months.  [place] Crane, MD 02/19/2019, 2:41 PM

## 2019-02-22 ENCOUNTER — Other Ambulatory Visit: Payer: Self-pay | Admitting: *Deleted

## 2019-02-22 NOTE — Patient Outreach (Signed)
[facility]) Care Management  02/22/2019  Luis Miller. [birth year] [phone]   RN Health Coach telephone call to patient.  [employer] compliance verified. RN Health Coach described services that are available from [facility].  RN discussed how Education officer, museum can assist with community resources. RN discussed how the Pharmacists can help assist with resources for to get medications if unable to afford. RN discussed the 24 hr nurse line and the services RN can help in education and managing his conditions.    Per patient his blood pressure is good. Patient stated he didn't feel he needed any additional information on this. Patient is CKD stage 4. Patient stated he is being monitored closely by nephrology. Per patient his PCP has retired. RN told patient to call practice and see who is available to be his new PCP.  Patient has declined [facility] services at this time.   Plan: RN sent unsuccessful outreach letter Pamphlet 24 hr Nurse [place] Management [phone]

## 2019-02-27 DIAGNOSIS — M25561 Pain in right knee: Secondary | ICD-10-CM | POA: Diagnosis not present

## 2019-02-27 DIAGNOSIS — G8929 Other chronic pain: Secondary | ICD-10-CM | POA: Diagnosis not present

## 2019-02-27 DIAGNOSIS — Z96652 Presence of left artificial knee joint: Secondary | ICD-10-CM | POA: Diagnosis not present

## 2019-02-27 DIAGNOSIS — M1711 Unilateral primary osteoarthritis, right knee: Secondary | ICD-10-CM | POA: Diagnosis not present

## 2019-03-01 DIAGNOSIS — Z85828 Personal history of other malignant neoplasm of skin: Secondary | ICD-10-CM | POA: Diagnosis not present

## 2019-03-01 DIAGNOSIS — D225 Melanocytic nevi of trunk: Secondary | ICD-10-CM | POA: Diagnosis not present

## 2019-03-01 DIAGNOSIS — D2261 Melanocytic nevi of right upper limb, including shoulder: Secondary | ICD-10-CM | POA: Diagnosis not present

## 2019-03-01 DIAGNOSIS — Z85820 Personal history of malignant melanoma of skin: Secondary | ICD-10-CM | POA: Diagnosis not present

## 2019-03-01 DIAGNOSIS — D2271 Melanocytic nevi of right lower limb, including hip: Secondary | ICD-10-CM | POA: Diagnosis not present

## 2019-03-01 DIAGNOSIS — D2262 Melanocytic nevi of left upper limb, including shoulder: Secondary | ICD-10-CM | POA: Diagnosis not present

## 2019-03-01 DIAGNOSIS — D2272 Melanocytic nevi of left lower limb, including hip: Secondary | ICD-10-CM | POA: Diagnosis not present

## 2019-03-07 ENCOUNTER — Telehealth: Payer: Self-pay

## 2019-03-07 NOTE — Telephone Encounter (Signed)
ok i spoke with Luis Miller, he stated that the seroquel come in and he was calling to see why you sent that one in when you guys had discussed that it was going to cost him more starting next month. but he stated that yesterday the new rx came in the mail. i explained that maybe the pharmacy had his medication on auto refill and that they had already refill and sent out before you two discussed the changes. pt stated that he wants to finish what he had of the seroquel before starting the other. he did nt want to waste his money. I told him that i would let you know.

## 2019-03-07 NOTE — Telephone Encounter (Signed)
Okay, finishing what he has first sounds good as he has been on Seroquel for quite some time.

## 2019-03-26 ENCOUNTER — Other Ambulatory Visit: Payer: Self-pay | Admitting: *Deleted

## 2019-03-26 DIAGNOSIS — R972 Elevated prostate specific antigen [PSA]: Secondary | ICD-10-CM

## 2019-03-27 ENCOUNTER — Other Ambulatory Visit: Payer: Self-pay

## 2019-03-27 ENCOUNTER — Other Ambulatory Visit: Payer: Medicare Other

## 2019-03-27 DIAGNOSIS — R972 Elevated prostate specific antigen [PSA]: Secondary | ICD-10-CM | POA: Diagnosis not present

## 2019-03-28 LAB — PSA: Prostate Specific Ag, Serum: 5.9 ng/mL — ABNORMAL HIGH (ref 0.0–4.0)

## 2019-03-29 NOTE — Progress Notes (Signed)
03/30/2019 10:17 AM   Luis Miller. 1945-08-07 [id number]  Referring provider: Guadalupe Maple, MD No address on file  Chief Complaint  Patient presents with  . Elevated PSA    HPI: Luis Miller is a 74 year old male with an elevated PSA, ED and BPH with LU TS who presents today for a 6 month follow up.  Elevated PSA Patient had a negative bx in 2002 for a PSA of 4.03 ng/mL.  His current PSA is 5.9 ng/mL in 02/2019.       Erectile dysfunction IPP placement for a malfunction IPP on 08/25/2015.  He is not sexually active at this time.    BPH WITH LUTS His IPSS score today is 7, which is mild lower urinary tract symptomatology.  He is mixed with his quality life due to his urinary symptoms.  His major complaints are frequency and nocturia.  His previous I PSS score was 4/2.Marland Kitchen  He denies any dysuria, hematuria or suprapubic pain.  He also denies any recent fevers, chills, nausea or vomiting.  He is adopted.   His nephrologist, Dr. Holley Raring, is having him drink 64 ounces of water daily.   May need to start dialysis in the future.    IPSS    Row Name 03/30/19 1000         International Prostate Symptom Score   How often have you had the sensation of not emptying your bladder?  Not at All     How often have you had to urinate less than every two hours?  Almost always     How often have you found you stopped and started again several times when you urinated?  Not at All     How often have you had a weak urinary stream?  Not at All     How often have you had to strain to start urination?  Not at All     How many times did you typically get up at night to urinate?  2 Times     Total IPSS Score  7       Quality of Life due to urinary symptoms   If you were to spend the rest of your life with your urinary condition just the way it is now how would you feel about that?  Mixed        Score:  1-7 Mild 8-19 Moderate 20-35 Severe   PMH: Past Medical History:  Diagnosis Date    . Anemia   . Apnea, sleep   . Arthritis   . Bipolar affective ([place])   . Cancer ([place]) malignant melanoma  . CRI (chronic renal insufficiency)   . Crohn's disease ([place])   . Depression   . Diabetes insipidus ([place])   . Erectile dysfunction   . Gout   . Hyperlipidemia   . Hypertension   . IBS (irritable bowel syndrome)   . Impotence   . Internal hemorrhoids   . 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[place])   . Paraphimosis   . Peyronie disease   . Secondary hyperparathyroidism of renal origin ([place])   . Skin cancer   . Testicular hypofunction   . Urinary frequency   . Urinary hesitancy     Surgical History: Past Surgical History:  Procedure Laterality Date  . arm fracture    . BONE MARROW BIOPSY    . COLONOSCOPY WITH PROPOFOL N/A 12/23/2014   Procedure: COLONOSCOPY WITH PROPOFOL;  Surgeon: Manya Silvas, MD;  Location:  [facility];  Service: Endoscopy;  Laterality: N/A;  . COLONOSCOPY WITH PROPOFOL N/A 02/06/2018   Procedure: COLONOSCOPY WITH PROPOFOL;  Surgeon: Manya Silvas, MD;  Location: [facility] END[facility]COPY;  Service: Endoscopy;  Laterality: N/A;  . KNEE ARTHROPLASTY Left 02/22/2018   Procedure: COMPUTER ASSISTED TOTAL KNEE ARTHROPLASTY;  Surgeon: Dereck Leep, MD;  Location: [facility] ORS;  Service: Orthopedics;  Laterality: Left;  Marland Kitchen MELANOMA EXCISION    . PENILE PROSTHESIS IMPLANT    . PENILE PROSTHESIS IMPLANT N/A 08/25/2015   Procedure: REPLACEMENT OF INFLATABLE PENILE PROSTHESIS COMPONENTS;  Surgeon: Cleon Gustin, MD;  Location: [facility] ORS;  Service: Urology;  Laterality: N/A;  . SKIN CANCER EXCISION     nose  . TONSILLECTOMY    . VASECTOMY      Home Medications:  Allergies as of 03/30/2019      Reactions   Mirtazapine Rash   Aspirin    Other reaction(s): Unknown Patient was instructed not to take aspirin but can not remember why   Atomoxetine Other (See Comments)   Urinary sx   Strattera [atomoxetine Hcl] Other (See Comments)   Urinary sx   Xyzal [levocetirizine  Dihydrochloride] Rash   Patient reported on 11/02/17      Medication List       Accurate as of March 30, 2019 10:17 AM. If you have any questions, ask your nurse or doctor.        allopurinol 100 MG tablet Commonly known as: ZYLOPRIM Take 200 mg by mouth daily.   aMILoride 5 MG tablet Commonly known as: MIDAMOR Take 1 tablet (5 mg total) by mouth daily.   amLODipine 10 MG tablet Commonly known as: NORVASC Take 1 tablet (10 mg total) by mouth daily.   benazepril 40 MG tablet Commonly known as: LOTENSIN Take 40 mg by mouth daily.   cyproheptadine 4 MG tablet Commonly known as: PERIACTIN Take one tablet three hours prior to intercourse   hydrALAZINE 100 MG tablet Commonly known as: APRESOLINE Take 100 mg by mouth 3 (three) times daily.   lithium carbonate 300 MG CR tablet Commonly known as: LITHOBID Take 1 tablet (300 mg total) by mouth at bedtime.   loperamide 2 MG capsule Commonly known as: IMODIUM Take 2 mg by mouth as needed for diarrhea or loose stools.   multivitamin tablet Take 1 tablet by mouth daily.   OLANZapine 2.5 MG tablet Commonly known as: ZyPREXA Take 1 tablet (2.5 mg total) by mouth at bedtime.   traZODone 50 MG tablet Commonly known as: DESYREL Take 1 tablet (50 mg total) by mouth at bedtime as needed.   Vitamin D (Cholecalciferol) 25 MCG (1000 UT) Tabs Take 1 tablet by mouth daily.       Allergies:  Allergies  Allergen Reactions  . Mirtazapine Rash  . Aspirin     Other reaction(s): Unknown Patient was instructed not to take aspirin but can not remember why  . Atomoxetine Other (See Comments)    Urinary sx  . Strattera [Atomoxetine Hcl] Other (See Comments)    Urinary sx  . Xyzal [Levocetirizine Dihydrochloride] Rash    Patient reported on 11/02/17    Family History: Family History  Problem Relation Age of Onset  . Depression Sister   . Bipolar disorder Other   . Prostate cancer Neg Hx   . Kidney cancer Neg Hx   . Bladder  Cancer Neg Hx     Social History:  reports that he quit smoking about 46 years ago. His  smoking use included cigarettes. He has never used smokeless tobacco. He reports previous alcohol use. He reports that he does not use drugs.  ROS: UROLOGY Frequent Urination?: Yes Hard to postpone urination?: No Burning/pain with urination?: No Get up at night to urinate?: Yes Leakage of urine?: No Urine stream starts and stops?: No Trouble starting stream?: No Do you have to strain to urinate?: No Blood in urine?: No Urinary tract infection?: No Sexually transmitted disease?: No Injury to kidneys or bladder?: No Painful intercourse?: No Weak stream?: No Erection problems?: No Penile pain?: No  Gastrointestinal Nausea?: No Vomiting?: No Indigestion/heartburn?: No Diarrhea?: No Constipation?: No  Constitutional Fever: No Night sweats?: No Weight loss?: No Fatigue?: No  Skin Skin rash/lesions?: No Itching?: No  Eyes Blurred vision?: No Double vision?: No  Ears/Nose/Throat Sore throat?: No Sinus problems?: No  Hematologic/Lymphatic Swollen glands?: No Easy bruising?: No  Cardiovascular Leg swelling?: Yes Chest pain?: No  Respiratory Cough?: No Shortness of breath?: No  Endocrine Excessive thirst?: Yes  Musculoskeletal Back pain?: No Joint pain?: No  Neurological Headaches?: No Dizziness?: No  Psychologic Depression?: Yes Anxiety?: No  Physical Exam: BP (!) 156/78   Pulse 73   Ht 5' 7"  (1.702 m)   Wt 200 lb (90.7 kg)   BMI 31.32 kg/m   Constitutional:  Well nourished. Alert and oriented, No acute distress. HEENT: [place] AT, mask in place.  Trachea midline, no masses. Cardiovascular: No clubbing, cyanosis, or edema. Respiratory: Normal respiratory effort, no increased work of breathing. GI: Abdomen is soft, non tender, non distended, no abdominal masses. Liver and spleen not palpable.  No hernias appreciated.  Stool sample for occult testing is not  indicated.   GU: No CVA tenderness.  No bladder fullness or masses.  Patient with uncircumcised phallus.  Foreskin easily retracted  Urethral meatus is patent.  No penile discharge. No penile lesions or rashes. Cylinders in place.  Glans floppy.  Scrotum without lesions, cysts, rashes and/or edema.  Testicles are located scrotally bilaterally. No masses are appreciated in the testicles. Left and right epididymis are normal.  Pump located in the scrotum.   Rectal: Patient with  normal sphincter tone. Anus and perineum without scarring or rashes. No rectal masses are appreciated. Prostate is approximately 50 grams, could only palpate apex and midportion of the gland, no nodules are appreciated. Seminal vesicles could not be palpated Skin: No rashes, bruises or suspicious lesions. Lymph: No  inguinal adenopathy. Neurologic: Grossly intact, no focal deficits, moving all 4 extremities. Psychiatric: Normal mood and affect.  Laboratory Data: Lab Results  Component Value Date   WBC 4.3 04/24/2018   HGB 10.5 (L) 04/24/2018   HCT 30.8 (L) 04/24/2018   MCV 99 (H) 04/24/2018   PLT 173 04/24/2018    Lab Results  Component Value Date   CREATININE 2.88 (H) 04/24/2018    Lab Results  Component Value Date   TSH 0.508 04/24/2018       Component Value Date/Time   CHOL 168 04/24/2018 0837   CHOL 180 10/03/2017 1101   HDL 43 04/24/2018 0837   CHOLHDL 3.9 04/24/2018 0837   VLDL 50 (H) 10/03/2017 1101   LDLCALC 96 04/24/2018 0837    Lab Results  Component Value Date   AST 14 04/24/2018   Lab Results  Component Value Date   ALT 11 04/24/2018   PSA History  5.6 ng/mL on 11/27/2013  4.9 ng/mL on 05/28/2014   Component     Latest Ref Rng &  Units 09/09/2014 06/03/2015 11/11/2015 05/31/2016  Prostate Specific Ag, Serum     0.0 - 4.0 ng/mL 5.7 (H) 5.5 (H) 6.4 (H) 6.4 (H)   Component     Latest Ref Rng & Units 12/06/2016 04/05/2017 06/13/2017 12/20/2017  Prostate Specific Ag, Serum     0.0 - 4.0  ng/mL 5.6 (H) 7.1 (H) 6.4 (H) 5.7 (H)   Component     Latest Ref Rng & Units 06/20/2018 09/26/2018 03/27/2019  Prostate Specific Ag, Serum     0.0 - 4.0 ng/mL 6.6 (H) 5.8 (H) 5.9 (H)   I have reviewed the labs.  Assessment & Plan:    1. Elevated PSA PSA currently is 5.9 - age specific PSA 73 to 75 years of age the range is 0 to 6.5 RTC in 6 months for PSA and exam   2. Erectile dysfunction Currently not sexually active RTC in 6 months for exam  3. BPH with LUTS IPSS score is 7/3, it is worse Continue conservative management, avoiding bladder irritants and timed voiding's RTC in 6 months for IPSS, PSA and exam    Return in about 6 months (around 09/27/2019) for IPSS, PSA and exam.  These notes generated with voice recognition software. I apologize for typographical errors.  Zara Council, PA-C  [facility] [address] [place], [place] [address] [phone]

## 2019-03-30 ENCOUNTER — Encounter: Payer: Self-pay | Admitting: Urology

## 2019-03-30 ENCOUNTER — Other Ambulatory Visit: Payer: Self-pay

## 2019-03-30 ENCOUNTER — Ambulatory Visit (INDEPENDENT_AMBULATORY_CARE_PROVIDER_SITE_OTHER): Payer: Medicare Other | Admitting: Urology

## 2019-03-30 VITALS — BP 156/78 | HR 73 | Ht 67.0 in | Wt 200.0 lb

## 2019-03-30 DIAGNOSIS — N401 Enlarged prostate with lower urinary tract symptoms: Secondary | ICD-10-CM | POA: Diagnosis not present

## 2019-03-30 DIAGNOSIS — N529 Male erectile dysfunction, unspecified: Secondary | ICD-10-CM | POA: Diagnosis not present

## 2019-03-30 DIAGNOSIS — R972 Elevated prostate specific antigen [PSA]: Secondary | ICD-10-CM

## 2019-03-30 DIAGNOSIS — N138 Other obstructive and reflux uropathy: Secondary | ICD-10-CM

## 2019-04-17 ENCOUNTER — Ambulatory Visit: Payer: Medicare Other | Admitting: Psychiatry

## 2019-04-26 ENCOUNTER — Other Ambulatory Visit: Payer: Self-pay

## 2019-04-26 ENCOUNTER — Ambulatory Visit: Payer: Self-pay | Admitting: Family Medicine

## 2019-04-26 ENCOUNTER — Encounter: Payer: Self-pay | Admitting: Family Medicine

## 2019-04-26 ENCOUNTER — Ambulatory Visit (INDEPENDENT_AMBULATORY_CARE_PROVIDER_SITE_OTHER): Payer: Medicare Other | Admitting: Family Medicine

## 2019-04-26 VITALS — BP 130/72 | HR 55 | Temp 97.8°F | Ht 68.0 in | Wt 197.0 lb

## 2019-04-26 DIAGNOSIS — R946 Abnormal results of thyroid function studies: Secondary | ICD-10-CM | POA: Diagnosis not present

## 2019-04-26 DIAGNOSIS — I10 Essential (primary) hypertension: Secondary | ICD-10-CM

## 2019-04-26 DIAGNOSIS — N401 Enlarged prostate with lower urinary tract symptoms: Secondary | ICD-10-CM

## 2019-04-26 DIAGNOSIS — N138 Other obstructive and reflux uropathy: Secondary | ICD-10-CM

## 2019-04-26 DIAGNOSIS — N184 Chronic kidney disease, stage 4 (severe): Secondary | ICD-10-CM | POA: Diagnosis not present

## 2019-04-26 DIAGNOSIS — F3176 Bipolar disorder, in full remission, most recent episode depressed: Secondary | ICD-10-CM | POA: Diagnosis not present

## 2019-04-26 DIAGNOSIS — E781 Pure hyperglyceridemia: Secondary | ICD-10-CM | POA: Diagnosis not present

## 2019-04-26 DIAGNOSIS — D638 Anemia in other chronic diseases classified elsewhere: Secondary | ICD-10-CM

## 2019-04-26 NOTE — Telephone Encounter (Signed)
Refill request for Amlodipine LOV: 04/24/2018 Next Appt: Today, 04/26/2019

## 2019-04-26 NOTE — Progress Notes (Signed)
BP 130/72   Pulse (!) 55   Temp 97.8 F (36.6 C) (Oral)   Ht 5' 8"  (1.727 m)   Wt 197 lb (89.4 kg)   SpO2 100%   BMI 29.95 kg/m    Subjective:    Patient ID: Luis Miller., male    DOB: 1946/01/04, 74 y.o.   MRN: [id number]  HPI: Luis Miller. is a 74 y.o. male  Chief Complaint  Patient presents with  . Depression  . Hypertension  . Thyroid Problem  . Hyperlipidemia   Patient presenting today for 6 month f/u chronic conditions.   HTN - Does not check BPs at home. Taking his medicines faithfully without side effects. Denies CP, SOB, HAs, dizziness.   Followed closely by numerous specialists, including Nephrology for CKD, Urology for elevated PSA and BPH, Psychiatry for bipolar disorder. States these are all stable currently.   Has a hx of high triglycerides, diet controlled at this time. Trying to watch diet closely.   Hx of thyroid abnormalities, states this has not been rechecked in quite some time. Asymptomatic.   Depression screen [facility] 2/9 11/20/2018 04/24/2018 11/14/2017  Decreased Interest 0 0 0  Down, Depressed, Hopeless 0 0 0  PHQ - 2 Score 0 0 0  Altered sleeping - 3 -  Tired, decreased energy - 0 -  Change in appetite - 0 -  Feeling bad or failure about yourself  - 0 -  Trouble concentrating - 0 -  Moving slowly or fidgety/restless - 0 -  Suicidal thoughts - 0 -  PHQ-9 Score - 3 -  No flowsheet data found.    Relevant past medical, surgical, family and social history reviewed and updated as indicated. Interim medical history since our last visit reviewed. Allergies and medications reviewed and updated.  Review of Systems  Per HPI unless specifically indicated above     Objective:    BP 130/72   Pulse (!) 55   Temp 97.8 F (36.6 C) (Oral)   Ht 5' 8"  (1.727 m)   Wt 197 lb (89.4 kg)   SpO2 100%   BMI 29.95 kg/m   Wt Readings from Last 3 Encounters:  04/26/19 197 lb (89.4 kg)  03/30/19 200 lb (90.7 kg)  11/20/18 198 lb 3.2 oz (89.9  kg)    Physical Exam Vitals and nursing note reviewed.  Constitutional:      Appearance: Normal appearance.  HENT:     Head: Atraumatic.  Eyes:     Extraocular Movements: Extraocular movements intact.     Conjunctiva/sclera: Conjunctivae normal.  Cardiovascular:     Rate and Rhythm: Normal rate and regular rhythm.  Pulmonary:     Effort: Pulmonary effort is normal.     Breath sounds: Normal breath sounds.  Abdominal:     General: Bowel sounds are normal.     Palpations: Abdomen is soft.     Tenderness: There is no abdominal tenderness. There is no guarding.  Musculoskeletal:        General: Normal range of motion.     Cervical back: Normal range of motion and neck supple.  Skin:    General: Skin is warm and dry.  Neurological:     General: No focal deficit present.     Mental Status: He is oriented to person, place, and time.  Psychiatric:        Mood and Affect: Mood normal.        Thought Content: Thought content normal.  Judgment: Judgment normal.     Results for orders placed or performed in visit on 04/26/19  CBC with Differential/Platelet  Result Value Ref Range   WBC 4.6 3.4 - 10.8 x10E3/uL   RBC 3.38 (L) 4.14 - 5.80 x10E6/uL   Hemoglobin 12.0 (L) 13.0 - 17.7 g/dL   Hematocrit 34.9 (L) 37.5 - 51.0 %   MCV 103 (H) 79 - 97 fL   MCH 35.5 (H) 26.6 - 33.0 pg   MCHC 34.4 31.5 - 35.7 g/dL   RDW 13.0 11.6 - 15.4 %   Platelets 155 150 - 450 x10E3/uL   Neutrophils 69 Not Estab. %   Lymphs 17 Not Estab. %   Monocytes 8 Not Estab. %   Eos 5 Not Estab. %   Basos 1 Not Estab. %   Neutrophils Absolute 3.3 1.4 - 7.0 x10E3/uL   Lymphocytes Absolute 0.8 0.7 - 3.1 x10E3/uL   Monocytes Absolute 0.4 0.1 - 0.9 x10E3/uL   EOS (ABSOLUTE) 0.2 0.0 - 0.4 x10E3/uL   Basophils Absolute 0.0 0.0 - 0.2 x10E3/uL   Immature Granulocytes 0 Not Estab. %   Immature Grans (Abs) 0.0 0.0 - 0.1 x10E3/uL  Comprehensive metabolic panel  Result Value Ref Range   Glucose 92 65 - 99 mg/dL    BUN 50 (H) 8 - 27 mg/dL   Creatinine, Ser 3.82 (H) 0.76 - 1.27 mg/dL   GFR calc non Af Amer 15 (L) >59 mL/min/1.73   GFR calc Af Amer 17 (L) >59 mL/min/1.73   BUN/Creatinine Ratio 13 10 - 24   Sodium 146 (H) 134 - 144 mmol/L   Potassium 4.5 3.5 - 5.2 mmol/L   Chloride 114 (H) 96 - 106 mmol/L   CO2 19 (L) 20 - 29 mmol/L   Calcium 9.9 8.6 - 10.2 mg/dL   Total Protein 7.1 6.0 - 8.5 g/dL   Albumin 4.7 3.7 - 4.7 g/dL   Globulin, Total 2.4 1.5 - 4.5 g/dL   Albumin/Globulin Ratio 2.0 1.2 - 2.2   Bilirubin Total 0.6 0.0 - 1.2 mg/dL   Alkaline Phosphatase 85 39 - 117 IU/L   AST 19 0 - 40 IU/L   ALT 20 0 - 44 IU/L  Lipid Panel w/o Chol/HDL Ratio  Result Value Ref Range   Cholesterol, Total 202 (H) 100 - 199 mg/dL   Triglycerides 176 (H) 0 - 149 mg/dL   HDL 41 >39 mg/dL   VLDL Cholesterol Cal 31 5 - 40 mg/dL   LDL Chol Calc (NIH) 130 (H) 0 - 99 mg/dL  TSH  Result Value Ref Range   TSH 1.060 0.450 - 4.500 uIU/mL      Assessment & Plan:   Problem List Items Addressed This Visit      Cardiovascular and Mediastinum   Hypertension - Primary (Chronic)    BPs stable and WNL, continue current regimen and close home monitoring      Relevant Medications   aMILoride (MIDAMOR) 5 MG tablet   amLODipine (NORVASC) 10 MG tablet     Genitourinary   CKD (chronic kidney disease) stage 4, GFR 15-29 ml/min (HCC)    Followed closely by Nephrology, continue per their recommendations      Relevant Orders   Comprehensive metabolic panel (Completed)   Benign prostatic hyperplasia with urinary obstruction    Following with Urology who is closely monitoring sxs and PSA. Continue per their recommendations        Other   Bipolar disorder, in full remission, most recent episode  depressed ([place])    Followed by Psychiatry, continue per their recommendations      Abnormal thyroid function test    Recheck TSH, adjust based on results      Relevant Orders   TSH (Completed)    Other Visit  Diagnoses    Anemia of chronic disease       Relevant Orders   CBC with Differential/Platelet (Completed)   Hypertriglyceridemia       Relevant Medications   aMILoride (MIDAMOR) 5 MG tablet   amLODipine (NORVASC) 10 MG tablet   Other Relevant Orders   Lipid Panel w/o Chol/HDL Ratio (Completed)       Follow up plan: Return in about 6 months (around 10/24/2019) for 6 month f/u.

## 2019-04-27 LAB — LIPID PANEL W/O CHOL/HDL RATIO
Cholesterol, Total: 202 mg/dL — ABNORMAL HIGH (ref 100–199)
HDL: 41 mg/dL (ref >39)
LDL Chol Calc (NIH): 130 mg/dL — ABNORMAL HIGH (ref 0–99)
Triglycerides: 176 mg/dL — ABNORMAL HIGH (ref 0–149)
VLDL Cholesterol Cal: 31 mg/dL (ref 5–40)

## 2019-04-27 LAB — CBC WITH DIFFERENTIAL/PLATELET
Basophils Absolute: 0 10*3/uL (ref 0.0–0.2)
Basos: 1 %
EOS (ABSOLUTE): 0.2 10*3/uL (ref 0.0–0.4)
Eos: 5 %
Hematocrit: 34.9 % — ABNORMAL LOW (ref 37.5–51.0)
Hemoglobin: 12 g/dL — ABNORMAL LOW (ref 13.0–17.7)
Immature Grans (Abs): 0 10*3/uL (ref 0.0–0.1)
Immature Granulocytes: 0 %
Lymphocytes Absolute: 0.8 10*3/uL (ref 0.7–3.1)
Lymphs: 17 %
MCH: 35.5 pg — ABNORMAL HIGH (ref 26.6–33.0)
MCHC: 34.4 g/dL (ref 31.5–35.7)
MCV: 103 fL — ABNORMAL HIGH (ref 79–97)
Monocytes Absolute: 0.4 10*3/uL (ref 0.1–0.9)
Monocytes: 8 %
Neutrophils Absolute: 3.3 10*3/uL (ref 1.4–7.0)
Neutrophils: 69 %
Platelets: 155 10*3/uL (ref 150–450)
RBC: 3.38 x10E6/uL — ABNORMAL LOW (ref 4.14–5.80)
RDW: 13 % (ref 11.6–15.4)
WBC: 4.6 10*3/uL (ref 3.4–10.8)

## 2019-04-27 LAB — COMPREHENSIVE METABOLIC PANEL
ALT: 20 IU/L (ref 0–44)
AST: 19 IU/L (ref 0–40)
Albumin/Globulin Ratio: 2 (ref 1.2–2.2)
Albumin: 4.7 g/dL (ref 3.7–4.7)
Alkaline Phosphatase: 85 IU/L (ref 39–117)
BUN/Creatinine Ratio: 13 (ref 10–24)
BUN: 50 mg/dL — ABNORMAL HIGH (ref 8–27)
Bilirubin Total: 0.6 mg/dL (ref 0.0–1.2)
CO2: 19 mmol/L — ABNORMAL LOW (ref 20–29)
Calcium: 9.9 mg/dL (ref 8.6–10.2)
Chloride: 114 mmol/L — ABNORMAL HIGH (ref 96–106)
Creatinine, Ser: 3.82 mg/dL — ABNORMAL HIGH (ref 0.76–1.27)
GFR calc Af Amer: 17 mL/min/{1.73_m2} — ABNORMAL LOW (ref >59)
GFR calc non Af Amer: 15 mL/min/{1.73_m2} — ABNORMAL LOW (ref >59)
Globulin, Total: 2.4 g/dL (ref 1.5–4.5)
Glucose: 92 mg/dL (ref 65–99)
Potassium: 4.5 mmol/L (ref 3.5–5.2)
Sodium: 146 mmol/L — ABNORMAL HIGH (ref 134–144)
Total Protein: 7.1 g/dL (ref 6.0–8.5)

## 2019-04-27 LAB — TSH: TSH: 1.06 u[IU]/mL (ref 0.450–4.500)

## 2019-04-27 MED ORDER — AMILORIDE HCL 5 MG PO TABS: 5.0000 mg | ORAL_TABLET | Freq: Every day | ORAL | 1 refills | Status: DC

## 2019-04-27 MED ORDER — AMLODIPINE BESYLATE 10 MG PO TABS: 10.0000 mg | ORAL_TABLET | Freq: Every day | ORAL | 1 refills | Status: DC

## 2019-04-27 NOTE — Assessment & Plan Note (Signed)
Recheck TSH, adjust based on results

## 2019-04-27 NOTE — Assessment & Plan Note (Signed)
Followed closely by Nephrology, continue per their recommendations

## 2019-04-27 NOTE — Assessment & Plan Note (Signed)
Following with Urology who is closely monitoring sxs and PSA. Continue per their recommendations

## 2019-04-27 NOTE — Assessment & Plan Note (Signed)
BPs stable and WNL, continue current regimen and close home monitoring

## 2019-04-27 NOTE — Telephone Encounter (Signed)
Already refilled in [place] encounter

## 2019-04-27 NOTE — Assessment & Plan Note (Signed)
Followed by Psychiatry, continue per their recommendations

## 2019-05-17 DIAGNOSIS — D631 Anemia in chronic kidney disease: Secondary | ICD-10-CM | POA: Diagnosis not present

## 2019-05-17 DIAGNOSIS — N184 Chronic kidney disease, stage 4 (severe): Secondary | ICD-10-CM | POA: Diagnosis not present

## 2019-05-17 DIAGNOSIS — R809 Proteinuria, unspecified: Secondary | ICD-10-CM | POA: Diagnosis not present

## 2019-05-17 DIAGNOSIS — I10 Essential (primary) hypertension: Secondary | ICD-10-CM | POA: Diagnosis not present

## 2019-05-17 DIAGNOSIS — N2581 Secondary hyperparathyroidism of renal origin: Secondary | ICD-10-CM | POA: Diagnosis not present

## 2019-06-25 ENCOUNTER — Telehealth: Payer: Self-pay | Admitting: Family Medicine

## 2019-06-25 NOTE — Telephone Encounter (Signed)
Noted, call if worsening in meantime

## 2019-06-25 NOTE — Telephone Encounter (Signed)
Called pt scheduled appt for 06/28/19  Copied from [place] #[id number]. Topic: General - Other >> Jun 25, 2019 11:58 AM [name] wrote: Reason for CRM: Patient called to inform [name] that he may be having a gout flare up but it has not yet gotten really bad. He stated that years ago he was given colchicine tablet 0.3 mg   which helped and want [name] to please prescribe him some. Ph# [phone]

## 2019-06-28 ENCOUNTER — Ambulatory Visit (INDEPENDENT_AMBULATORY_CARE_PROVIDER_SITE_OTHER): Payer: Medicare Other | Admitting: Family Medicine

## 2019-06-28 ENCOUNTER — Encounter: Payer: Self-pay | Admitting: Family Medicine

## 2019-06-28 VITALS — BP 125/69 | Wt 205.0 lb

## 2019-06-28 DIAGNOSIS — M10371 Gout due to renal impairment, right ankle and foot: Secondary | ICD-10-CM | POA: Diagnosis not present

## 2019-06-28 MED ORDER — COLCHICINE 0.6 MG PO TABS: 0.3000 mg | ORAL_TABLET | Freq: Every day | ORAL | 0 refills | Status: DC | PRN

## 2019-06-28 NOTE — Progress Notes (Signed)
BP 125/69   Wt 205 lb (93 kg)   BMI 31.17 kg/m    Subjective:    Patient ID: Luis Coss., male    DOB: 1945-05-04, 74 y.o.   MRN: [id number]  HPI: Luis Melchior. is a 74 y.o. male  Chief Complaint  Patient presents with  . Gout    right big toe x about 2 weeks    . This visit was completed via telephone due to the restrictions of the COVID-19 pandemic. All issues as above were discussed and addressed. Physical exam was done as above through visual confirmation on telephone. If it was felt that the patient should be evaluated in the office, they were directed there. The patient verbally consented to this visit. . Location of the patient: home . Location of the provider: home . Those involved with this call:  . Provider: [name], PA-C . CMA: Lesle Chris, [place] . Front Desk/Registration: Jill Side  . Time spent on call: 15 minutes on the phone discussing health concerns. 5 minutes total spent in review of patient's record and preparation of their chart. I verified patient identity using two factors (patient name and date of birth). Patient consents verbally to being seen via telemedicine visit today.   Right great toe pain for about 2 weeks now. Hx of gout in this toe and states this is consistent with past flares. Currently on daily allopurinol and hasn't had a flare in over a year. Tries to watch diet. Has severe CKD followed closely by Nephrology. Denies fever, chills, injury, numbness or tingling in foot.   Relevant past medical, surgical, family and social history reviewed and updated as indicated. Interim medical history since our last visit reviewed. Allergies and medications reviewed and updated.  Review of Systems  Per HPI unless specifically indicated above     Objective:    BP 125/69   Wt 205 lb (93 kg)   BMI 31.17 kg/m   Wt Readings from Last 3 Encounters:  06/28/19 205 lb (93 kg)  04/26/19 197 lb (89.4 kg)  03/30/19 200 lb (90.7 kg)      Physical Exam  Unable to perform PE due to patient lack of access to video technology for today's visit.   Results for orders placed or performed in visit on 04/26/19  CBC with Differential/Platelet  Result Value Ref Range   WBC 4.6 3.4 - 10.8 x10E3/uL   RBC 3.38 (L) 4.14 - 5.80 x10E6/uL   Hemoglobin 12.0 (L) 13.0 - 17.7 g/dL   Hematocrit 34.9 (L) 37.5 - 51.0 %   MCV 103 (H) 79 - 97 fL   MCH 35.5 (H) 26.6 - 33.0 pg   MCHC 34.4 31.5 - 35.7 g/dL   RDW 13.0 11.6 - 15.4 %   Platelets 155 150 - 450 x10E3/uL   Neutrophils 69 Not Estab. %   Lymphs 17 Not Estab. %   Monocytes 8 Not Estab. %   Eos 5 Not Estab. %   Basos 1 Not Estab. %   Neutrophils Absolute 3.3 1.4 - 7.0 x10E3/uL   Lymphocytes Absolute 0.8 0.7 - 3.1 x10E3/uL   Monocytes Absolute 0.4 0.1 - 0.9 x10E3/uL   EOS (ABSOLUTE) 0.2 0.0 - 0.4 x10E3/uL   Basophils Absolute 0.0 0.0 - 0.2 x10E3/uL   Immature Granulocytes 0 Not Estab. %   Immature Grans (Abs) 0.0 0.0 - 0.1 x10E3/uL  Comprehensive metabolic panel  Result Value Ref Range   Glucose 92 65 - 99 mg/dL  BUN 50 (H) 8 - 27 mg/dL   Creatinine, Ser 3.82 (H) 0.76 - 1.27 mg/dL   GFR calc non Af Amer 15 (L) >59 mL/min/1.73   GFR calc Af Amer 17 (L) >59 mL/min/1.73   BUN/Creatinine Ratio 13 10 - 24   Sodium 146 (H) 134 - 144 mmol/L   Potassium 4.5 3.5 - 5.2 mmol/L   Chloride 114 (H) 96 - 106 mmol/L   CO2 19 (L) 20 - 29 mmol/L   Calcium 9.9 8.6 - 10.2 mg/dL   Total Protein 7.1 6.0 - 8.5 g/dL   Albumin 4.7 3.7 - 4.7 g/dL   Globulin, Total 2.4 1.5 - 4.5 g/dL   Albumin/Globulin Ratio 2.0 1.2 - 2.2   Bilirubin Total 0.6 0.0 - 1.2 mg/dL   Alkaline Phosphatase 85 39 - 117 IU/L   AST 19 0 - 40 IU/L   ALT 20 0 - 44 IU/L  Lipid Panel w/o Chol/HDL Ratio  Result Value Ref Range   Cholesterol, Total 202 (H) 100 - 199 mg/dL   Triglycerides 176 (H) 0 - 149 mg/dL   HDL 41 >39 mg/dL   VLDL Cholesterol Cal 31 5 - 40 mg/dL   LDL Chol Calc (NIH) 130 (H) 0 - 99 mg/dL  TSH  Result  Value Ref Range   TSH 1.060 0.450 - 4.500 uIU/mL      Assessment & Plan:   Problem List Items Addressed This Visit      Musculoskeletal and Integument   Acute gout due to renal impairment involving right foot - Primary    Continue allopurinol, add prn colchicine at half tab daily until flare resolves. Discussed tart cherry supplements for prevention      Relevant Medications   colchicine 0.6 MG tablet       Follow up plan: Return for as scheduled.

## 2019-06-29 NOTE — Assessment & Plan Note (Signed)
Continue allopurinol, add prn colchicine at half tab daily until flare resolves. Discussed tart cherry supplements for prevention

## 2019-07-05 ENCOUNTER — Other Ambulatory Visit: Payer: Self-pay

## 2019-07-05 ENCOUNTER — Ambulatory Visit (INDEPENDENT_AMBULATORY_CARE_PROVIDER_SITE_OTHER): Payer: Medicare Other | Admitting: Psychiatry

## 2019-07-05 ENCOUNTER — Encounter: Payer: Self-pay | Admitting: Psychiatry

## 2019-07-05 DIAGNOSIS — G4700 Insomnia, unspecified: Secondary | ICD-10-CM | POA: Diagnosis not present

## 2019-07-05 DIAGNOSIS — F3176 Bipolar disorder, in full remission, most recent episode depressed: Secondary | ICD-10-CM | POA: Diagnosis not present

## 2019-07-05 MED ORDER — LITHIUM CARBONATE ER 300 MG PO TBCR: 300.0000 mg | EXTENDED_RELEASE_TABLET | Freq: Every day | ORAL | 1 refills | Status: DC

## 2019-07-05 MED ORDER — OLANZAPINE 2.5 MG PO TABS: 2.5000 mg | ORAL_TABLET | Freq: Every day | ORAL | 1 refills | Status: DC

## 2019-07-05 MED ORDER — TRAZODONE HCL 50 MG PO TABS: 50.0000 mg | ORAL_TABLET | Freq: Every evening | ORAL | 1 refills | Status: DC | PRN

## 2019-07-05 NOTE — Progress Notes (Addendum)
[facility] MD OP Progress Note  I connected with  Luis Miller. on 07/05/19 by phone and verified that I am speaking with the correct person using two identifiers.   I discussed the limitations of evaluation and management by phone. The patient expressed understanding and agreed to proceed.  I spent 8 minutes with the patient on the phone.   07/05/2019 11:11 AM Luis Miller.  MRN:  [id number]  Chief Complaint:  " I just need refills on my Lithium."  HPI: Patient reported that he is doing well.  He stated that he still has a few office Seroquel tablets left and is trying to finish that bottle before he switches to olanzapine due to insurance coverage issues.  He has been taking his lithium regularly.  He denied any acute issues or concerns at this time.  Pt is under the care of nephrologist Dr. Holley Raring for chronic kidney disease.  His most recent EGFR around 19.  Visit Diagnosis:    ICD-10-CM   1. Bipolar disorder, in full remission, most recent episode depressed ([place])  F31.76 lithium carbonate (LITHOBID) 300 MG CR tablet    traZODone (DESYREL) 50 MG tablet    OLANZapine (ZYPREXA) 2.5 MG tablet  2. Insomnia disorder related to known organic factor  G47.00 traZODone (DESYREL) 50 MG tablet    Past Psychiatric History: Bipolar disorder  Past Medical History:  Past Medical History:  Diagnosis Date  . Anemia   . Apnea, sleep   . Arthritis   . Bipolar affective ([place])   . Cancer ([place]) malignant melanoma  . CRI (chronic renal insufficiency)   . Crohn's disease ([place])   . Depression   . Diabetes insipidus ([place])   . Erectile dysfunction   . Gout   . Hyperlipidemia   . Hypertension   . IBS (irritable bowel syndrome)   . Impotence   . Internal hemorrhoids   . 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[place])   . Paraphimosis   . Peyronie disease   . Secondary hyperparathyroidism of renal origin ([place])   . Skin cancer   . Testicular hypofunction   . Urinary frequency   . Urinary hesitancy      Past Surgical History:  Procedure Laterality Date  . arm fracture    . BONE MARROW BIOPSY    . COLONOSCOPY WITH PROPOFOL N/A 12/23/2014   Procedure: COLONOSCOPY WITH PROPOFOL;  Surgeon: Manya Silvas, MD;  Location: [facility] ENDOSCOPY;  Service: Endoscopy;  Laterality: N/A;  . COLONOSCOPY WITH PROPOFOL N/A 02/06/2018   Procedure: COLONOSCOPY WITH PROPOFOL;  Surgeon: Manya Silvas, MD;  Location[facility] ENDOSCOPY;  Service: Endoscopy;  Laterality: N/A;  . KNEE ARTHROPLASTY Left 02/22/2018   Procedure: COMPUTER ASSISTED TOTAL KNEE ARTHROPLASTY;  Surgeon: Dereck Leep, MD;  Location: [facility] ORS;  Service: Orthopedics;  Laterality: Left;  Marland Kitchen MELANOMA EXCISION    . PENILE PROSTHESIS IMPLANT    . PENILE PROSTHESIS IMPLANT N/A 08/25/2015   Procedure: REPLACEMENT OF INFLATABLE PENILE PROSTHESIS COMPONENTS;  Surgeon: Cleon Gustin, MD;  Location: [facility] ORS;  Service: Urology;  Laterality: N/A;  . SKIN CANCER EXCISION     nose  . TONSILLECTOMY    . VASECTOMY      Family Psychiatric History: see below  Family History:  Family History  Problem Relation Age of Onset  . Depression Sister   . Bipolar disorder Other   . Prostate cancer Neg Hx   . Kidney cancer Neg Hx   . Bladder Cancer Neg Hx  Social History:  Social History   Socioeconomic History  . Marital status: Divorced    Spouse name: Not on file  . Number of children: 2  . Years of education: Not on file  . Highest education level: High school graduate  Occupational History  . Occupation: retired  Tobacco Use  . Smoking status: Former Smoker    Types: Cigarettes    Quit date: 09/08/1972    Years since quitting: 46.8  . Smokeless tobacco: Never Used  Substance and Sexual Activity  . Alcohol use: Not Currently    Alcohol/week: 0.0 standard drinks    Comment: Occasional glass of wine  . Drug use: No  . Sexual activity: Yes    Partners: Female    Birth control/protection: None  Other Topics Concern  . Not on file   Social History Narrative  . Not on file   Social Determinants of Health   Financial Resource Strain:   . Difficulty of Paying Living Expenses:   Food Insecurity:   . Worried About Charity fundraiser in the Last Year:   . Arboriculturist in the Last Year:   Transportation Needs:   . Film/video editor (Medical):   Marland Kitchen Lack of Transportation (Non-Medical):   Physical Activity: Inactive  . Days of Exercise per Week: 0 days  . Minutes of Exercise per Session: 0 min  Stress:   . Feeling of Stress :   Social Connections:   . Frequency of Communication with Friends and Family:   . Frequency of Social Gatherings with Friends and Family:   . Attends Religious Services:   . Active Member of Clubs or Organizations:   . Attends Archivist Meetings:   Marland Kitchen Marital Status:     Allergies:  Allergies  Allergen Reactions  . Mirtazapine Rash  . Aspirin     Other reaction(s): Unknown Patient was instructed not to take aspirin but can not remember why  . Atomoxetine Other (See Comments)    Urinary sx  . Strattera [Atomoxetine Hcl] Other (See Comments)    Urinary sx  . Xyzal [Levocetirizine Dihydrochloride] Rash    Patient reported on 6/71/24    Metabolic Disorder Labs: No results found for: HGBA1C, MPG No results found for: PROLACTIN Lab Results  Component Value Date   CHOL 202 (H) 04/26/2019   TRIG 176 (H) 04/26/2019   HDL 41 04/26/2019   CHOLHDL 3.9 04/24/2018   VLDL 50 (H) 10/03/2017   LDLCALC 130 (H) 04/26/2019   LDLCALC 96 04/24/2018   Lab Results  Component Value Date   TSH 1.060 04/26/2019   TSH 0.508 04/24/2018    Therapeutic Level Labs: Lab Results  Component Value Date   LITHIUM 0.7 04/24/2018   LITHIUM 0.7 06/07/2016   No results found for: VALPROATE No components found for:  CBMZ  Current Medications: Current Outpatient Medications  Medication Sig Dispense Refill  . allopurinol (ZYLOPRIM) 100 MG tablet Take 200 mg by mouth daily.     Marland Kitchen  aMILoride (MIDAMOR) 5 MG tablet Take 1 tablet (5 mg total) by mouth daily. 90 tablet 1  . amLODipine (NORVASC) 10 MG tablet Take 1 tablet (10 mg total) by mouth daily. 90 tablet 1  . benazepril (LOTENSIN) 40 MG tablet Take 40 mg by mouth daily.     . colchicine 0.6 MG tablet Take 0.5 tablets (0.3 mg total) by mouth daily as needed. 5 tablet 0  . cyproheptadine (PERIACTIN) 4 MG tablet Take one tablet  three hours prior to intercourse (Patient not taking: Reported on 04/26/2019) 90 tablet 3  . hydrALAZINE (APRESOLINE) 100 MG tablet Take 100 mg by mouth 3 (three) times daily.    Marland Kitchen lithium carbonate (LITHOBID) 300 MG CR tablet Take 1 tablet (300 mg total) by mouth at bedtime. 90 tablet 1  . loperamide (IMODIUM) 2 MG capsule Take 2 mg by mouth as needed for diarrhea or loose stools.    . Multiple Vitamin (MULTIVITAMIN) tablet Take 1 tablet by mouth daily.    Marland Kitchen OLANZapine (ZYPREXA) 2.5 MG tablet Take 1 tablet (2.5 mg total) by mouth at bedtime. 90 tablet 1  . QUEtiapine (SEROQUEL) 25 MG tablet Take 25 mg by mouth at bedtime.    . traZODone (DESYREL) 50 MG tablet Take 1 tablet (50 mg total) by mouth at bedtime as needed. 90 tablet 1  . Vitamin D, Cholecalciferol, 25 MCG (1000 UT) TABS Take 1 tablet by mouth daily.      No current facility-administered medications for this visit.      Psychiatric Specialty Exam: ROS  There were no vitals taken for this visit.There is no height or weight on file to calculate BMI.  General Appearance: unable to assess due to phone visit  Eye Contact:  Unable to assess due to phone visit  Speech:  Clear and Coherent and Normal Rate  Volume:  Normal  Mood:  Worried about seroquel  Affect:  Congruent  Thought Process:  Goal Directed, Linear and Descriptions of Associations: Intact  Orientation:  Full (Time, Place, and Person)  Thought Content: Logical, Rumination and fixated on seroquel   Suicidal Thoughts:  No  Homicidal Thoughts:  No  Memory:  Recent;    Good Remote;   Good  Judgement:  Fair  Insight:  Fair  Psychomotor Activity:  Normal  Concentration:  Concentration: Good and Attention Span: Fair  Recall:  Good  Fund of Knowledge: Good  Language: Good  Akathisia:  Negative  Handed:  Right  AIMS (if indicated): not done due to phone visit  Assets:  Communication Skills Desire for Improvement Financial Resources/Insurance  ADL's:  Intact  Cognition: WNL  Sleep:  Good   Screenings: Mini-Mental     [facility] Visit from [facility]/23/2017 in [facility]  Total Score (max 30 points )  28    PHQ2-9     Clinical Support from 11/20/2018 in [facility] Visit from 04/24/2018 in [facility] from 11/14/2017 in [facility] Visit from 04/05/2017 in [facility] Visit from 02/23/2017 in [facility]  PHQ-2 Total Score  0  0  0  0  0  PHQ-9 Total Score  --  3  --  --  --       Assessment and Plan: 74 year old male who has been on lithium for many years.  He has chronic kidney disease and is followed by nephrologist.  He wants to continue taking the lithium despite the kidney disease as it has really been helpful and he does not want to relapse.  He was also taking Seroquel 25 mg at bedtime for a long time however due to insurance coverage issues he will now be taking olanzapine 2.5 mg at bedtime starting in a few days.   1. Bipolar disorder, in full remission, most recent episode depressed ([place])  - lithium carbonate (LITHOBID) 300 MG CR tablet; Take 1 tablet (300 mg total) by mouth at bedtime.  Dispense: 90 tablet; Refill:  0 - traZODone (DESYREL) 50 MG tablet; Take 1 tablet (50 mg total) by mouth at bedtime as needed.  Dispense: 90 tablet; Refill: 0 - Start OLANZapine (ZYPREXA) 2.5 MG tablet; Take 1 tablet (2.5 mg total) by mouth at bedtime.  Dispense: 90 tablet; Refill: 0  2. Insomnia disorder related to known organic factor  - traZODone  (DESYREL) 50 MG tablet; Take 1 tablet (50 mg total) by mouth at bedtime as needed.  Dispense: 90 tablet; Refill: 0 - Start OLANZapine (ZYPREXA) 2.5 MG tablet; Take 1 tablet (2.5 mg total) by mouth at bedtime.  Dispense: 90 tablet; Refill: 0  Patient stated that he is doing well and he does see the need to be seen frequently and wants 6 months follow-up. F/up in 6 months.  [place] Crane, MD 07/05/2019, 11:11 AM

## 2019-07-19 DIAGNOSIS — N184 Chronic kidney disease, stage 4 (severe): Secondary | ICD-10-CM | POA: Diagnosis not present

## 2019-07-19 DIAGNOSIS — R809 Proteinuria, unspecified: Secondary | ICD-10-CM | POA: Diagnosis not present

## 2019-07-19 DIAGNOSIS — I10 Essential (primary) hypertension: Secondary | ICD-10-CM | POA: Diagnosis not present

## 2019-07-19 DIAGNOSIS — D631 Anemia in chronic kidney disease: Secondary | ICD-10-CM | POA: Diagnosis not present

## 2019-07-30 ENCOUNTER — Ambulatory Visit: Payer: Self-pay | Admitting: Family Medicine

## 2019-07-30 NOTE — Telephone Encounter (Signed)
Called pt to let him know of Rachel's message, pt does not want to see anyone else. Moved appt to Wednesday 2:45 advised if he gets worse he may want to seek UC or ER

## 2019-07-30 NOTE — Telephone Encounter (Signed)
Pt with multiple issues, evasive historian.  Reports started [employer] , stopped 2 weeks ago "Wasn't working." Developed symptoms when stopped, sudden onset:  "Off balance a bit, not confident when talking, voice weaker, writing different." Also reports insomnia, decreased urine output, "A little less."denies dysuria. Denies headache, no unilateral weakness,no CP, no SOB, speech clear and non-halting during call, does not check BP at home. Denies dizziness, "Just have to think more when I'm walking."   St. 4 kidney disease, states called nephrologist, and reported symptoms.. Reports nephrologist stated "Maybe my lithium level is off." Questioned pt as to what f/u from nephrologist recommended, states has appt next Thursday.   Agent made pt appt with Ms. Lane for this Thursday prior to triage.  Please advise if earlier appt, labs appropriate Care advise given, go to ED if any symptoms worsen.  [phone]  Reason for Disposition . [1] Loss of speech or garbled speech AND [2] is a chronic symptom (recurrent or ongoing AND present > 4 weeks)    2 weeks present, no loss of speech, not garbled, "Not confident when talking."  Answer Assessment - Initial Assessment Questions 1. SYMPTOM: "What is the main symptom you are concerned about?" (e.g., weakness, numbness)     "Balance off a bit" "Voice weaker" "Not confident when talking." 2. ONSET: "When did this start?" (minutes, hours, days; while sleeping)     2 weeks ago 3. LAST NORMAL: "When was the last time you were normal (no symptoms)?"     2 weeks ago 4. PATTERN "Does this come and go, or has it been constant since it started?"  "Is it present now?"     Constant 5. CARDIAC SYMPTOMS: "Have you had any of the following symptoms: chest pain, difficulty breathing, palpitations?"     no 6. NEUROLOGIC SYMPTOMS: "Have you had any of the following symptoms: headache, dizziness, vision loss, double vision, changes in speech, unsteady on your feet?"    Gait off balance slightly 7. OTHER SYMPTOMS: "Do you have any other symptoms?"    Diarrhea at times x 2 weeks, output decreased.  Protocols used: NEUROLOGIC DEFICIT-A-AH

## 2019-07-30 NOTE — Telephone Encounter (Signed)
OK to work in sooner with another provider if pt agreeable

## 2019-08-01 ENCOUNTER — Inpatient Hospital Stay
Admission: EM | Admit: 2019-08-01 | Discharge: 2019-08-06 | DRG: 683 | Disposition: A | Discharge status: Home Health | Payer: Medicare Other | Attending: Family Medicine | Admitting: Family Medicine

## 2019-08-01 ENCOUNTER — Emergency Department: Payer: Medicare Other

## 2019-08-01 ENCOUNTER — Ambulatory Visit (INDEPENDENT_AMBULATORY_CARE_PROVIDER_SITE_OTHER): Payer: Medicare Other | Admitting: Family Medicine

## 2019-08-01 ENCOUNTER — Encounter: Payer: Self-pay | Admitting: Family Medicine

## 2019-08-01 ENCOUNTER — Other Ambulatory Visit: Payer: Self-pay

## 2019-08-01 VITALS — BP 112/62 | HR 32 | Temp 97.5°F

## 2019-08-01 DIAGNOSIS — E872 Acidosis: Secondary | ICD-10-CM | POA: Diagnosis present

## 2019-08-01 DIAGNOSIS — I129 Hypertensive chronic kidney disease with stage 1 through stage 4 chronic kidney disease, or unspecified chronic kidney disease: Secondary | ICD-10-CM | POA: Diagnosis not present

## 2019-08-01 DIAGNOSIS — Z20822 Contact with and (suspected) exposure to covid-19: Secondary | ICD-10-CM | POA: Diagnosis present

## 2019-08-01 DIAGNOSIS — N2581 Secondary hyperparathyroidism of renal origin: Secondary | ICD-10-CM | POA: Diagnosis present

## 2019-08-01 DIAGNOSIS — I4581 Long QT syndrome: Secondary | ICD-10-CM | POA: Diagnosis present

## 2019-08-01 DIAGNOSIS — Z87891 Personal history of nicotine dependence: Secondary | ICD-10-CM

## 2019-08-01 DIAGNOSIS — Z8639 Personal history of other endocrine, nutritional and metabolic disease: Secondary | ICD-10-CM

## 2019-08-01 DIAGNOSIS — T56891S Toxic effect of other metals, accidental (unintentional), sequela: Secondary | ICD-10-CM | POA: Diagnosis not present

## 2019-08-01 DIAGNOSIS — T43595A Adverse effect of other antipsychotics and neuroleptics, initial encounter: Secondary | ICD-10-CM | POA: Diagnosis present

## 2019-08-01 DIAGNOSIS — N170 Acute kidney failure with tubular necrosis: Principal | ICD-10-CM | POA: Diagnosis present

## 2019-08-01 DIAGNOSIS — I442 Atrioventricular block, complete: Secondary | ICD-10-CM | POA: Diagnosis present

## 2019-08-01 DIAGNOSIS — E870 Hyperosmolality and hypernatremia: Secondary | ICD-10-CM | POA: Diagnosis not present

## 2019-08-01 DIAGNOSIS — R009 Unspecified abnormalities of heart beat: Secondary | ICD-10-CM

## 2019-08-01 DIAGNOSIS — D472 Monoclonal gammopathy: Secondary | ICD-10-CM | POA: Diagnosis present

## 2019-08-01 DIAGNOSIS — D631 Anemia in chronic kidney disease: Secondary | ICD-10-CM | POA: Diagnosis present

## 2019-08-01 DIAGNOSIS — G4700 Insomnia, unspecified: Secondary | ICD-10-CM | POA: Diagnosis present

## 2019-08-01 DIAGNOSIS — E669 Obesity, unspecified: Secondary | ICD-10-CM | POA: Diagnosis present

## 2019-08-01 DIAGNOSIS — N281 Cyst of kidney, acquired: Secondary | ICD-10-CM | POA: Diagnosis present

## 2019-08-01 DIAGNOSIS — T56891A Toxic effect of other metals, accidental (unintentional), initial encounter: Secondary | ICD-10-CM

## 2019-08-01 DIAGNOSIS — R270 Ataxia, unspecified: Secondary | ICD-10-CM | POA: Diagnosis present

## 2019-08-01 DIAGNOSIS — R809 Proteinuria, unspecified: Secondary | ICD-10-CM | POA: Diagnosis present

## 2019-08-01 DIAGNOSIS — Z8719 Personal history of other diseases of the digestive system: Secondary | ICD-10-CM

## 2019-08-01 DIAGNOSIS — F3176 Bipolar disorder, in full remission, most recent episode depressed: Secondary | ICD-10-CM | POA: Diagnosis present

## 2019-08-01 DIAGNOSIS — Z888 Allergy status to other drugs, medicaments and biological substances status: Secondary | ICD-10-CM

## 2019-08-01 DIAGNOSIS — T43505A Adverse effect of unspecified antipsychotics and neuroleptics, initial encounter: Secondary | ICD-10-CM | POA: Diagnosis not present

## 2019-08-01 DIAGNOSIS — T56894A Toxic effect of other metals, undetermined, initial encounter: Secondary | ICD-10-CM | POA: Diagnosis not present

## 2019-08-01 DIAGNOSIS — E871 Hypo-osmolality and hyponatremia: Secondary | ICD-10-CM | POA: Diagnosis not present

## 2019-08-01 DIAGNOSIS — N185 Chronic kidney disease, stage 5: Secondary | ICD-10-CM | POA: Diagnosis present

## 2019-08-01 DIAGNOSIS — I959 Hypotension, unspecified: Secondary | ICD-10-CM | POA: Diagnosis not present

## 2019-08-01 DIAGNOSIS — R197 Diarrhea, unspecified: Secondary | ICD-10-CM | POA: Diagnosis not present

## 2019-08-01 DIAGNOSIS — E860 Dehydration: Secondary | ICD-10-CM | POA: Diagnosis present

## 2019-08-01 DIAGNOSIS — Z96652 Presence of left artificial knee joint: Secondary | ICD-10-CM | POA: Diagnosis present

## 2019-08-01 DIAGNOSIS — Z85820 Personal history of malignant melanoma of skin: Secondary | ICD-10-CM | POA: Diagnosis not present

## 2019-08-01 DIAGNOSIS — N179 Acute kidney failure, unspecified: Secondary | ICD-10-CM | POA: Diagnosis present

## 2019-08-01 DIAGNOSIS — I10 Essential (primary) hypertension: Secondary | ICD-10-CM | POA: Diagnosis not present

## 2019-08-01 DIAGNOSIS — Z886 Allergy status to analgesic agent status: Secondary | ICD-10-CM

## 2019-08-01 DIAGNOSIS — N184 Chronic kidney disease, stage 4 (severe): Secondary | ICD-10-CM | POA: Diagnosis present

## 2019-08-01 DIAGNOSIS — G4733 Obstructive sleep apnea (adult) (pediatric): Secondary | ICD-10-CM | POA: Diagnosis present

## 2019-08-01 DIAGNOSIS — Z79899 Other long term (current) drug therapy: Secondary | ICD-10-CM | POA: Diagnosis not present

## 2019-08-01 DIAGNOSIS — I120 Hypertensive chronic kidney disease with stage 5 chronic kidney disease or end stage renal disease: Secondary | ICD-10-CM | POA: Diagnosis not present

## 2019-08-01 DIAGNOSIS — Z6831 Body mass index (BMI) 31.0-31.9, adult: Secondary | ICD-10-CM

## 2019-08-01 DIAGNOSIS — M109 Gout, unspecified: Secondary | ICD-10-CM | POA: Diagnosis present

## 2019-08-01 DIAGNOSIS — R001 Bradycardia, unspecified: Secondary | ICD-10-CM

## 2019-08-01 DIAGNOSIS — I517 Cardiomegaly: Secondary | ICD-10-CM | POA: Diagnosis not present

## 2019-08-01 DIAGNOSIS — I498 Other specified cardiac arrhythmias: Secondary | ICD-10-CM | POA: Diagnosis not present

## 2019-08-01 LAB — URINALYSIS, COMPLETE (UACMP) WITH MICROSCOPIC
Bacteria, UA: NONE SEEN
Bilirubin Urine: NEGATIVE
Glucose, UA: NEGATIVE mg/dL
Hgb urine dipstick: NEGATIVE
Ketones, ur: NEGATIVE mg/dL
Leukocytes,Ua: NEGATIVE
Nitrite: NEGATIVE
Protein, ur: 30 mg/dL — AB
Specific Gravity, Urine: 1.011 (ref 1.005–1.030)
Squamous Epithelial / HPF: NONE SEEN (ref 0–5)
pH: 5 (ref 5.0–8.0)

## 2019-08-01 LAB — CBC
HCT: 31.4 % — ABNORMAL LOW (ref 39.0–52.0)
Hemoglobin: 10.3 g/dL — ABNORMAL LOW (ref 13.0–17.0)
MCH: 35.6 pg — ABNORMAL HIGH (ref 26.0–34.0)
MCHC: 32.8 g/dL (ref 30.0–36.0)
MCV: 108.7 fL — ABNORMAL HIGH (ref 80.0–100.0)
Platelets: 130 10*3/uL — ABNORMAL LOW (ref 150–400)
RBC: 2.89 MIL/uL — ABNORMAL LOW (ref 4.22–5.81)
RDW: 13.8 % (ref 11.5–15.5)
WBC: 5 10*3/uL (ref 4.0–10.5)
nRBC: 0 % (ref 0.0–0.2)

## 2019-08-01 LAB — MAGNESIUM: Magnesium: 2.9 mg/dL — ABNORMAL HIGH (ref 1.7–2.4)

## 2019-08-01 LAB — BASIC METABOLIC PANEL
Anion gap: 9 (ref 5–15)
BUN: 123 mg/dL — ABNORMAL HIGH (ref 8–23)
CO2: 11 mmol/L — ABNORMAL LOW (ref 22–32)
Calcium: 9.2 mg/dL (ref 8.9–10.3)
Chloride: 121 mmol/L — ABNORMAL HIGH (ref 98–111)
Creatinine, Ser: 7.32 mg/dL — ABNORMAL HIGH (ref 0.61–1.24)
GFR calc Af Amer: 8 mL/min — ABNORMAL LOW (ref >60)
GFR calc non Af Amer: 7 mL/min — ABNORMAL LOW (ref >60)
Glucose, Bld: 116 mg/dL — ABNORMAL HIGH (ref 70–99)
Potassium: 4.8 mmol/L (ref 3.5–5.1)
Sodium: 141 mmol/L (ref 135–145)

## 2019-08-01 LAB — LITHIUM LEVEL: Lithium Lvl: 1.67 mmol/L (ref 0.60–1.20)

## 2019-08-01 LAB — SODIUM, URINE, RANDOM: Sodium, Ur: 37 mmol/L

## 2019-08-01 LAB — TSH: TSH: 1.226 u[IU]/mL (ref 0.350–4.500)

## 2019-08-01 LAB — SARS CORONAVIRUS 2 BY RT PCR (HOSPITAL ORDER, PERFORMED IN ~~LOC~~ HOSPITAL LAB): SARS Coronavirus 2: NEGATIVE

## 2019-08-01 LAB — CREATININE, URINE, RANDOM: Creatinine, Urine: 194 mg/dL

## 2019-08-01 MED ORDER — SODIUM CHLORIDE 0.9 % IV BOLUS
500.0000 mL | Freq: Once | INTRAVENOUS | Status: AC
  Administered 2019-08-01: 500 mL via INTRAVENOUS

## 2019-08-01 MED ORDER — SODIUM CHLORIDE 0.9 % IV SOLN: INTRAVENOUS | Status: DC

## 2019-08-01 MED ORDER — ACETAMINOPHEN 650 MG RE SUPP: 650.0000 mg | Freq: Four times a day (QID) | RECTAL | Status: DC | PRN

## 2019-08-01 MED ORDER — SODIUM CHLORIDE 0.9% FLUSH
3.0000 mL | Freq: Two times a day (BID) | INTRAVENOUS | Status: DC
  Administered 2019-08-02 – 2019-08-05 (×6): 3 mL via INTRAVENOUS

## 2019-08-01 MED ORDER — SODIUM CHLORIDE 0.9 % IV SOLN: Freq: Once | INTRAVENOUS | Status: AC

## 2019-08-01 MED ORDER — ACETAMINOPHEN 325 MG PO TABS: 650.0000 mg | ORAL_TABLET | Freq: Four times a day (QID) | ORAL | Status: DC | PRN

## 2019-08-01 MED ORDER — HYDRALAZINE HCL 50 MG PO TABS: 100.0000 mg | ORAL_TABLET | Freq: Three times a day (TID) | ORAL | Status: DC

## 2019-08-01 MED ORDER — ONDANSETRON HCL 4 MG/2ML IJ SOLN: 4.0000 mg | Freq: Four times a day (QID) | INTRAMUSCULAR | Status: DC | PRN

## 2019-08-01 MED ORDER — ONDANSETRON HCL 4 MG PO TABS: 4.0000 mg | ORAL_TABLET | Freq: Four times a day (QID) | ORAL | Status: DC | PRN

## 2019-08-01 NOTE — Progress Notes (Signed)
Patient placed on Auto CPAP, 21% FIO2, SAT 97%. Heart rate noted to be in low 40s. Patient states he feels fine. No respiratory distress noted. Call bell placed in reach. Will continue to monitor.

## 2019-08-01 NOTE — H&P (Addendum)
History and Physical    [name] SNK:[id number] DOB: 06/23/1945 DOA: 08/01/2019  PCP: Volney American, PA-C  Patient coming from: PCP office  I have personally briefly reviewed patient's old medical records in [place]  Chief Complaint: Bradycardia  HPI: Arjen Deringer. is a 74 y.o. male with medical history significant for CKD stage IV, bipolar disorder, and hypertension who presents to the ED from his PCP office for evaluation of bradycardia.  Patient reports about 2 weeks of generally feeling unwell with fatigue/lethargy, shakes, and decreased urine output.  He states he has felt off balance but has not fallen or lost consciousness.  He went to his primary care office earlier today for further evaluation and was noted to be bradycardic and was subsequently sent to the ED for further evaluation.  He currently denies any chest pain, palpitation, nausea, vomiting, abdominal pain, dyspnea, cough, or diarrhea.  He reports seeing some blood mixed into the stool and on tissue paper when he wipes after having a bowel movement.  He denies subjective fevers or diaphoresis but reports having chills.  ED Course:  Initial vitals showed BP 126/65, pulse 49, RR 16, temp 98.0 Fahrenheit, SPO2 99% on room air.  Labs are notable for BUN 123, creatinine 7.32, EGFR 7 (creatinine 5.29 and GFR 10 on 07/19/2019), sodium 141, potassium 4.8, bicarb 11, serum glucose 116, WBC 5.0, hemoglobin 10.3, platelets 130,000.  Lithium level is elevated at 1.67.  Portable chest x-ray showed enlarged cardiac silhouette with low lung volumes.  No focal consolidation, edema, or effusion noted.  Renal ultrasound showed changes consistent with medical renal disease, bilateral renal cysts simple in nature, no obstructive changes seen.  EDP discussed the case with on-call nephrology who recommended IV fluid hydration and medical admission.  Patient was started on IV normal saline and the hospitalist  service was consulted to admit for further evaluation management.  Review of Systems: All systems reviewed and are negative except as documented in history of present illness above.   Past Medical History:  Diagnosis Date  . Anemia   . Apnea, sleep   . Arthritis   . Bipolar affective ([place])   . Cancer ([place]) malignant melanoma  . CRI (chronic renal insufficiency)   . Crohn's disease ([place])   . Depression   . Diabetes insipidus ([place])   . Erectile dysfunction   . Gout   . Hyperlipidemia   . Hypertension   . IBS (irritable bowel syndrome)   . Impotence   . Internal hemorrhoids   . 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[place])   . Paraphimosis   . Peyronie disease   . Secondary hyperparathyroidism of renal origin ([place])   . Skin cancer   . Testicular hypofunction   . Urinary frequency   . Urinary hesitancy     Past Surgical History:  Procedure Laterality Date  . arm fracture    . BONE MARROW BIOPSY    . COLONOSCOPY WITH PROPOFOL N/A 12/23/2014   Procedure: COLONOSCOPY WITH PROPOFOL;  Surgeon: Manya Silvas, MD;  Location: [facility] ENDOSCOPY;  Service: Endoscopy;  Laterality: N/A;  . COLONOSCOPY WITH PROPOFOL N/A 02/06/2018   Procedure: COLONOSCOPY WITH PROPOFOL;  Surgeon: Manya Silvas, MD;  Location: [facility] ENDOSCOPY;  Service: Endoscopy;  Laterality: N/A;  . KNEE ARTHROPLASTY Left 02/22/2018   Procedure: COMPUTER ASSISTED TOTAL KNEE ARTHROPLASTY;  Surgeon: Dereck Leep, MD;  Location: [facility] ORS;  Service: Orthopedics;  Laterality: Left;  Marland Kitchen MELANOMA EXCISION    .  PENILE PROSTHESIS IMPLANT    . PENILE PROSTHESIS IMPLANT N/A 08/25/2015   Procedure: REPLACEMENT OF INFLATABLE PENILE PROSTHESIS COMPONENTS;  Surgeon: Cleon Gustin, MD;  Location: [facility] ORS;  Service: Urology;  Laterality: N/A;  . SKIN CANCER EXCISION     nose  . TONSILLECTOMY    . VASECTOMY      Social History:  reports that he quit smoking about 46 years ago. His smoking use included cigarettes. He has never used  smokeless tobacco. He reports previous alcohol use. He reports that he does not use drugs.  Allergies  Allergen Reactions  . Mirtazapine Rash  . Aspirin     Other reaction(s): Unknown Patient was instructed not to take aspirin but can not remember why  . Atomoxetine Other (See Comments)    Urinary sx  . Strattera [Atomoxetine Hcl] Other (See Comments)    Urinary sx  . Xyzal [Levocetirizine Dihydrochloride] Rash    Patient reported on 11/02/17    Family History  Problem Relation Age of Onset  . Depression Sister   . Bipolar disorder Other   . Prostate cancer Neg Hx   . Kidney cancer Neg Hx   . Bladder Cancer Neg Hx      Prior to Admission medications   Medication Sig Start Date End Date Taking? Authorizing Provider  allopurinol (ZYLOPRIM) 100 MG tablet Take 200 mg by mouth daily.  12/11/15   [provider]  aMILoride (MIDAMOR) 5 MG tablet Take 1 tablet (5 mg total) by mouth daily. 04/27/19   Volney American, PA-C  amLODipine (NORVASC) 10 MG tablet Take 1 tablet (10 mg total) by mouth daily. 04/27/19   Volney American, PA-C  benazepril (LOTENSIN) 40 MG tablet Take 40 mg by mouth daily.  08/27/14   [provider]  colchicine 0.6 MG tablet Take 0.5 tablets (0.3 mg total) by mouth daily as needed. 06/28/19   Volney American, PA-C  cyproheptadine (PERIACTIN) 4 MG tablet Take one tablet three hours prior to intercourse 12/15/16   Zara Council A, PA-C  hydrALAZINE (APRESOLINE) 100 MG tablet Take 100 mg by mouth 3 (three) times daily.    [provider]  lithium carbonate (LITHOBID) 300 MG CR tablet Take 1 tablet (300 mg total) by mouth at bedtime. 07/05/19   [place] Crane, MD  loperamide (IMODIUM) 2 MG capsule Take 2 mg by mouth as needed for diarrhea or loose stools.    [provider]  Multiple Vitamin (MULTIVITAMIN) tablet Take 1 tablet by mouth daily.    [provider]  OLANZapine (ZYPREXA) 2.5 MG tablet Take 1 tablet (2.5  mg total) by mouth at bedtime. Patient not taking: Reported on 08/01/2019 07/05/19   [place] Crane, MD  QUEtiapine (SEROQUEL) 25 MG tablet Take 25 mg by mouth at bedtime.    [provider]  traZODone (DESYREL) 50 MG tablet Take 1 tablet (50 mg total) by mouth at bedtime as needed. 07/05/19   [place] Crane, MD  Vitamin D, Cholecalciferol, 25 MCG (1000 UT) TABS Take 1 tablet by mouth daily.     [provider]    Physical Exam: Vitals:   08/01/19 1622 08/01/19 1623 08/01/19 1624 08/01/19 1830  BP: 126/65   114/64  Pulse: 83   (!) 32  Resp: 16   (!) 39  Temp:  98 F (36.7 C)    TempSrc:  Oral    SpO2: 99%   98%  Weight:   89.8 kg   Height:  5' 8"  (1.727 m)    Constitutional: Resting supine in bed, NAD, calm, comfortable Eyes: PERRL, lids and conjunctivae normal ENMT: Mucous membranes are dry. Posterior pharynx clear of any exudate or lesions. Neck: normal, supple, no masses. Respiratory: clear to auscultation bilaterally, no wheezing, no crackles. Normal respiratory effort. No accessory muscle use.  Cardiovascular: Bradycardic, no murmurs / rubs / gallops.  Trace bilateral lower extremity edema. 2+ pedal pulses. Abdomen: no tenderness, no masses palpated. No hepatosplenomegaly. Bowel sounds positive.  Musculoskeletal: no clubbing / cyanosis. No joint deformity upper and lower extremities. Good ROM, no contractures. Normal muscle tone.  Skin: no rashes, lesions, ulcers. No induration Neurologic: CN 2-12 grossly intact. Sensation intact, Strength 5/5 in all 4, generalized slight tremors.  Psychiatric: Normal judgment and insight. Alert and oriented x 3. Normal mood.   Labs on Admission: I have personally reviewed following labs and imaging studies  CBC: Recent Labs  Lab 08/01/19 1627  WBC 5.0  HGB 10.3*  HCT 31.4*  MCV 108.7*  PLT 229*   Basic Metabolic Panel: Recent Labs  Lab 08/01/19 1627  NA 141  K 4.8  CL 121*  CO2 11*  GLUCOSE 116*  BUN 123*    CREATININE 7.32*  CALCIUM 9.2   GFR: Estimated Creatinine Clearance: 9.6 mL/min (A) (by C-G formula based on SCr of 7.32 mg/dL (H)). Liver Function Tests: No results for input(s): AST, ALT, ALKPHOS, BILITOT, PROT, ALBUMIN in the last 168 hours. No results for input(s): LIPASE, AMYLASE in the last 168 hours. No results for input(s): AMMONIA in the last 168 hours. Coagulation Profile: No results for input(s): INR, PROTIME in the last 168 hours. Cardiac Enzymes: No results for input(s): CKTOTAL, CKMB, CKMBINDEX, TROPONINI in the last 168 hours. BNP (last 3 results) No results for input(s): PROBNP in the last 8760 hours. HbA1C: No results for input(s): HGBA1C in the last 72 hours. CBG: No results for input(s): GLUCAP in the last 168 hours. Lipid Profile: No results for input(s): CHOL, HDL, LDLCALC, TRIG, CHOLHDL, LDLDIRECT in the last 72 hours. Thyroid Function Tests: No results for input(s): TSH, T4TOTAL, FREET4, T3FREE, THYROIDAB in the last 72 hours. Anemia Panel: No results for input(s): VITAMINB12, FOLATE, FERRITIN, TIBC, IRON, RETICCTPCT in the last 72 hours. Urine analysis:    Component Value Date/Time   COLORURINE STRAW (A) 02/08/2018 1326   APPEARANCEUR Clear 04/24/2018 0835   LABSPEC 1.005 02/08/2018 1326   PHURINE 6.0 02/08/2018 1326   GLUCOSEU Negative 04/24/2018 0835   HGBUR NEGATIVE 02/08/2018 1326   BILIRUBINUR Negative 04/24/2018 [place] 02/08/2018 1326   PROTEINUR 1+ (A) 04/24/2018 0835   PROTEINUR 30 (A) 02/08/2018 1326   NITRITE Negative 04/24/2018 0835   NITRITE NEGATIVE 02/08/2018 1326   LEUKOCYTESUR Trace (A) 04/24/2018 0835    Radiological Exams on Admission: No results found.  EKG: Independently reviewed.  Atrial fibrillation vs flutter with slow ventricular response, rate 39.  Prior outpatient EKG same day 08/01/2019 showed sinus bradycardia, rate 31.  Previous EKG November 2019 showed sinus rhythm with rate  65.  Assessment/Plan Principal Problem:   Acute renal failure superimposed on stage 4 chronic kidney disease (HCC) Active Problems:   Hypertension   Bipolar disorder, in full remission, most recent episode depressed ([place])   Lithium use   Bradycardia  Cleaven Demario. is a 74 y.o. male with medical history significant for CKD stage IV, bipolar disorder, and hypertension who is admitted with AKI on CKD stage IV and bradycardia.  AKI on CKD stage IV: Likely progression of chronic kidney disease exacerbated by dehydration from decreased oral intake and medication side effect.  -Continue hydration with IV NS at 125 mL/hr overnight -Holding home benazepril and amiloride -Renal ultrasound without evidence of obstruction -Urinalysis without evidence of UTI -Monitor strict I/O's and daily weights  Bradycardia: Rates between 30-50s, intermittently sinus versus irregular.  Review of prior EKGs show history of bradycardia in August 2018 and May 2017. -Monitor on telemetry, keep in PCU tonight -Holding home amlodipine, lithium, trazodone -TSH 1.226 -Magnesium 2.9 -Obtain echocardiogram  Bipolar disorder Elevated lithium level: Currently taking lithium, Seroquel, and trazodone.  He was planned to switch from Seroquel to Zyprexa due to insurance issues but states he has not started Zyprexa yet as he still has tablets of Seroquel at home.  Lithium level is elevated at 1.67 in setting of worsening renal function. -Holding home lithium, Seroquel, trazodone. -Repeat lithium level in a.m.  BRBPR: Patient reports recent blood mixed in stool and present on tissue paper.  Hemoglobin stable and consistent with anemia of chronic disease.  Platelets slightly decreased.  Check FOBT and monitor closely for further signs/symptoms of bleeding.  Hypertension: -Continue home hydralazine -Holding home benazepril and amiloride as above  OSA: -Continue CPAP nightly   DVT prophylaxis: SCDs Code Status:  Full code, confirmed with patient Family Communication: Discussed with patient, he is discussed with family Disposition Plan: From home, likely discharge to home pending progression of renal function and stability of bradycardia.  May require initiation of peritoneal dialysis while in hospital versus outpatient. Consults called: EDP discussed with on-call nephrology Admission status:  Status is: Observation  The patient remains OBS appropriate and will d/c before 2 midnights.  Dispo: The patient is from: Home              Anticipated d/c is to: Home              Anticipated d/c date is: 1 day, pending progression of renal function and bradycardia              Patient currently is not medically stable to d/c.  Zada Finders MD [facility]  If 7PM-7AM, please contact night-coverage [email or web address]  08/01/2019, 6:46 PM

## 2019-08-01 NOTE — ED Notes (Signed)
Pt's pastor at bedside.

## 2019-08-01 NOTE — Progress Notes (Signed)
BP 112/62 (BP Location: Left Arm, Patient Position: Sitting, Cuff Size: Normal)   Pulse (!) 32   Temp (!) 97.5 F (36.4 C) (Oral)   SpO2 97%    Subjective:    Patient ID: [name], male    DOB: 08-Aug-1945, 74 y.o.   MRN: [id number]  HPI: Stevan Eberwein. is a 74 y.o. male  Chief Complaint  Patient presents with  . Fatigue    Patient states he feels very weak, staggered. Kidney doctor is concnered about lithium levels because his kidney function is lower.  . Diarrhea    Ongoing appx 2 weeks  . Rectal Bleeding    Bright Red blood  . Neck Pain    Across back of neck  . Insomnia    Patient having trouble sleeping, even though he's taking his medications. Has not started taking Olanzapine.  . Shaking    Patient states he has trouble with shaking and can't be still.    Presenting today with about 2 weeks of fatigue, weakness, tremors, diarrhea (now with some bright red rectal bleeding), altered mental status, slurred speech, dizziness. Of note, currently on lithium for bipolar disorder and renal function has recently significantly, Nephrologist concerned for lithium toxicity when he called regarding his current sxs. Denies fever, chills, recent illness. Not tolerating much PO. States he's barely urinating.   Relevant past medical, surgical, family and social history reviewed and updated as indicated. Interim medical history since our last visit reviewed. Allergies and medications reviewed and updated.  Review of Systems  Per HPI unless specifically indicated above     Objective:    BP 112/62 (BP Location: Left Arm, Patient Position: Sitting, Cuff Size: Normal)   Pulse (!) 32   Temp (!) 97.5 F (36.4 C) (Oral)   SpO2 97%   Wt Readings from Last 3 Encounters:  08/09/19 194 lb (88 kg)  08/06/19 196 lb 4.8 oz (89 kg)  06/28/19 205 lb (93 kg)    Physical Exam Vitals and nursing note reviewed.  Constitutional:      General: He is not in acute distress.  Appearance: He is ill-appearing.     Comments: Appears drowsy, mildly disoriented  HENT:     Head: Atraumatic.     Nose: Nose normal.     Mouth/Throat:     Mouth: Mucous membranes are dry.     Pharynx: No posterior oropharyngeal erythema.  Eyes:     Extraocular Movements: Extraocular movements intact.  Cardiovascular:     Rate and Rhythm: Bradycardia present.  Pulmonary:     Effort: Pulmonary effort is normal. No respiratory distress.     Breath sounds: Normal breath sounds.  Abdominal:     General: Bowel sounds are normal. There is no distension.     Palpations: Abdomen is soft.     Tenderness: There is no abdominal tenderness.  Musculoskeletal:     Cervical back: Normal range of motion and neck supple.  Neurological:     Comments: Gait staggered, speech discernable but not at baseline, mildly sedate, tremor of hands b/l  Psychiatric:        Judgment: Judgment normal.     Comments: Thought content normal, appears groggy     Results for orders placed or performed in visit on 04/26/19  CBC with Differential/Platelet  Result Value Ref Range   WBC 4.6 3.4 - 10.8 x10E3/uL   RBC 3.38 (L) 4.14 - 5.80 x10E6/uL   Hemoglobin 12.0 (L) 13.0 -  17.7 g/dL   Hematocrit 34.9 (L) 37.5 - 51.0 %   MCV 103 (H) 79 - 97 fL   MCH 35.5 (H) 26.6 - 33.0 pg   MCHC 34.4 31.5 - 35.7 g/dL   RDW 13.0 11.6 - 15.4 %   Platelets 155 150 - 450 x10E3/uL   Neutrophils 69 Not Estab. %   Lymphs 17 Not Estab. %   Monocytes 8 Not Estab. %   Eos 5 Not Estab. %   Basos 1 Not Estab. %   Neutrophils Absolute 3.3 1.4 - 7.0 x10E3/uL   Lymphocytes Absolute 0.8 0.7 - 3.1 x10E3/uL   Monocytes Absolute 0.4 0.1 - 0.9 x10E3/uL   EOS (ABSOLUTE) 0.2 0.0 - 0.4 x10E3/uL   Basophils Absolute 0.0 0.0 - 0.2 x10E3/uL   Immature Granulocytes 0 Not Estab. %   Immature Grans (Abs) 0.0 0.0 - 0.1 x10E3/uL  Comprehensive metabolic panel  Result Value Ref Range   Glucose 92 65 - 99 mg/dL   BUN 50 (H) 8 - 27 mg/dL   Creatinine,  Ser 3.82 (H) 0.76 - 1.27 mg/dL   GFR calc non Af Amer 15 (L) >59 mL/min/1.73   GFR calc Af Amer 17 (L) >59 mL/min/1.73   BUN/Creatinine Ratio 13 10 - 24   Sodium 146 (H) 134 - 144 mmol/L   Potassium 4.5 3.5 - 5.2 mmol/L   Chloride 114 (H) 96 - 106 mmol/L   CO2 19 (L) 20 - 29 mmol/L   Calcium 9.9 8.6 - 10.2 mg/dL   Total Protein 7.1 6.0 - 8.5 g/dL   Albumin 4.7 3.7 - 4.7 g/dL   Globulin, Total 2.4 1.5 - 4.5 g/dL   Albumin/Globulin Ratio 2.0 1.2 - 2.2   Bilirubin Total 0.6 0.0 - 1.2 mg/dL   Alkaline Phosphatase 85 39 - 117 IU/L   AST 19 0 - 40 IU/L   ALT 20 0 - 44 IU/L  Lipid Panel w/o Chol/HDL Ratio  Result Value Ref Range   Cholesterol, Total 202 (H) 100 - 199 mg/dL   Triglycerides 176 (H) 0 - 149 mg/dL   HDL 41 >39 mg/dL   VLDL Cholesterol Cal 31 5 - 40 mg/dL   LDL Chol Calc (NIH) 130 (H) 0 - 99 mg/dL  TSH  Result Value Ref Range   TSH 1.060 0.450 - 4.500 uIU/mL      Assessment & Plan:   Problem List Items Addressed This Visit      Genitourinary   Acute renal failure (ARF) (HCC)    Creatinine already above 5 on recent check with Nephrologist, and now s/p 2 weeks of severe diarrhea and poor PO intake additionally. Needs stat labs, fluids and possibly dialysis - urged pt to go to ER, he will call someone to come take him immediately         Other   Lithium use    Concern for lithium toxicity due to acute renal failure given sxs. Will have someone come pick him up and take him to the ER for immediate labs, imaging and tx of current sxs given significantly concerning nature of presentation.       Bradycardia    Significant bradycardia noted today, around 30-40 range on several checks with machine and manual reading as well as on EKG today. Otherwise EKG looks stable without acute ST or T wave abnormalities, evidence of obvious atrial fib or other issues. Concern for lithium toxicity and possible other causes for issue at current time. Pt will call someone  to take him to the  ER from clinic for immediate workup and treatment       Other Visit Diagnoses    Abnormal heart rate    -  Primary   Relevant Orders   EKG 12-Lead (Completed)      50 minutes spent today in chart review, direct care and counseling, and coordination of care including contacting ED for report  Follow up plan: Return for hospital f/u.

## 2019-08-01 NOTE — ED Notes (Signed)
Admitting provider at bedside.

## 2019-08-01 NOTE — ED Notes (Signed)
US at bedside

## 2019-08-01 NOTE — ED Triage Notes (Signed)
First Nurse Note:  Arrives from PCP for ED evaluation of bradycardia (30-35), rectal bleeding.  Per PCP, patient has history of CKD and recently has had some diarrhea.  Patient arrives to ED, AAOx3.  Skin warm and dry. NAD

## 2019-08-01 NOTE — ED Notes (Signed)
Bladder scan performed: 80m  Mitch, RN notified.

## 2019-08-01 NOTE — ED Notes (Signed)
Pt was seen at PCP today and referred to ED for bradycardia. Pt states he was being seen for "not feeling right and being unsteady on feet" x 2 weeks. Pt has hx of CKD.

## 2019-08-01 NOTE — ED Provider Notes (Signed)
[facility] Emergency Department Provider Note    First MD Initiated Contact with Patient 08/01/19 1735     (approximate)  I have reviewed the triage vital signs and the nursing notes.   HISTORY  Chief Complaint Bradycardia    HPI Luis Miller. is a 74 y.o. male history of CKD as well as past medical history on lithium presents to the ER for bradycardia from PCPs office. Reportedly did have an episode of diarrhea over the past few weeks may have gotten very dehydrated but had evidence of worsening renal function was sent to the ER. Had his lithium decreased recently. Denies any headache. No chest pain or shortness of breath. Does feel generalized malaise and weakness.    Past Medical History:  Diagnosis Date  . Anemia   . Apnea, sleep   . Arthritis   . Bipolar affective ([place])   . Cancer ([place]) malignant melanoma  . CRI (chronic renal insufficiency)   . Crohn's disease ([place])   . Depression   . Diabetes insipidus ([place])   . Erectile dysfunction   . Gout   . Hyperlipidemia   . Hypertension   . IBS (irritable bowel syndrome)   . Impotence   . Internal hemorrhoids   . 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[place])   . Paraphimosis   . Peyronie disease   . Secondary hyperparathyroidism of renal origin ([place])   . Skin cancer   . Testicular hypofunction   . Urinary frequency   . Urinary hesitancy    Family History  Problem Relation Age of Onset  . Depression Sister   . Bipolar disorder Other   . Prostate cancer Neg Hx   . Kidney cancer Neg Hx   . Bladder Cancer Neg Hx    Past Surgical History:  Procedure Laterality Date  . arm fracture    . BONE MARROW BIOPSY    . COLONOSCOPY WITH PROPOFOL N/A 12/23/2014   Procedure: COLONOSCOPY WITH PROPOFOL;  Surgeon: Manya Silvas, MD;  Location: [facility] ENDOSCOPY;  Service: Endoscopy;  Laterality: N/A;  . COLONOSCOPY WITH PROPOFOL N/A 02/06/2018   Procedure: COLONOSCOPY WITH PROPOFOL;  Surgeon: Manya Silvas, MD;  Location: [facility] ENDOSCOPY;  Service: Endoscopy;  Laterality: N/A;  . KNEE ARTHROPLASTY Left 02/22/2018   Procedure: COMPUTER ASSISTED TOTAL KNEE ARTHROPLASTY;  Surgeon: Dereck Leep, MD;  Location: [facility] ORS;  Service: Orthopedics;  Laterality: Left;  Marland Kitchen MELANOMA EXCISION    . PENILE PROSTHESIS IMPLANT    . PENILE PROSTHESIS IMPLANT N/A 08/25/2015   Procedure: REPLACEMENT OF INFLATABLE PENILE PROSTHESIS COMPONENTS;  Surgeon: Cleon Gustin, MD;  Location: [facility] ORS;  Service: Urology;  Laterality: N/A;  . SKIN CANCER EXCISION     nose  . TONSILLECTOMY    . VASECTOMY     Patient Active Problem List   Diagnosis Date Noted  . Insomnia disorder related to known organic factor 02/19/2019  . 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 Hemorrhoids, internal 12/12/2017  . Hx of adenomatous colonic polyps 12/05/2017  . Thrombosed external hemorrhoid 12/05/2017  . Drug-induced tremor 05/08/2017  . Diabetes insipidus ([place]) 04/05/2017  . Secondary hyperparathyroidism of renal origin ([place]) 04/05/2017  . Advanced care planning/counseling discussion 04/05/2017  . Carotid bruit 04/05/2017  . Bradycardia 11/08/2016  . Gout 02/26/2016  . Erectile dysfunction due to arterial insufficiency 07/08/2015  . Obesity 11/27/2014  . CKD (chronic kidney disease) stage 4, GFR 15-29 ml/min (HCC) 11/27/2014  . Acute gout due to renal  impairment involving right foot 11/22/2014  . 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  . Hypertension 09/09/2014  . Bipolar disorder, in full remission, most recent episode depressed ([place]) 09/09/2014  . Impotence 09/09/2014  . Lithium use 10/18/2013  . Abnormal thyroid function test 10/01/2013  . Hypertrophy of prostate with urinary obstruction and other lower urinary tract symptoms (LUTS) 01/13/2012  . Elevated prostate specific antigen (PSA) 01/13/2012  . Benign prostatic hyperplasia with urinary obstruction 01/13/2012  . ED (erectile dysfunction) of organic origin 01/13/2012    . Nocturia 01/13/2012  . 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 Peyronie's disease 01/13/2012  . Increased frequency of urination 01/13/2012  . Urinary hesitancy 01/13/2012      Prior to Admission medications   Medication Sig Start Date End Date Taking? Authorizing Provider  allopurinol (ZYLOPRIM) 100 MG tablet Take 200 mg by mouth daily.  12/11/15   [provider]  aMILoride (MIDAMOR) 5 MG tablet Take 1 tablet (5 mg total) by mouth daily. 04/27/19   Volney American, PA-C  amLODipine (NORVASC) 10 MG tablet Take 1 tablet (10 mg total) by mouth daily. 04/27/19   Volney American, PA-C  benazepril (LOTENSIN) 40 MG tablet Take 40 mg by mouth daily.  08/27/14   [provider]  colchicine 0.6 MG tablet Take 0.5 tablets (0.3 mg total) by mouth daily as needed. 06/28/19   Volney American, PA-C  cyproheptadine (PERIACTIN) 4 MG tablet Take one tablet three hours prior to intercourse 12/15/16   Zara Council A, PA-C  hydrALAZINE (APRESOLINE) 100 MG tablet Take 100 mg by mouth 3 (three) times daily.    [provider]  lithium carbonate (LITHOBID) 300 MG CR tablet Take 1 tablet (300 mg total) by mouth at bedtime. 07/05/19   [place] Crane, MD  loperamide (IMODIUM) 2 MG capsule Take 2 mg by mouth as needed for diarrhea or loose stools.    [provider]  Multiple Vitamin (MULTIVITAMIN) tablet Take 1 tablet by mouth daily.    [provider]  OLANZapine (ZYPREXA) 2.5 MG tablet Take 1 tablet (2.5 mg total) by mouth at bedtime. Patient not taking: Reported on 08/01/2019 07/05/19   [place] Crane, MD  QUEtiapine (SEROQUEL) 25 MG tablet Take 25 mg by mouth at bedtime.    [provider]  traZODone (DESYREL) 50 MG tablet Take 1 tablet (50 mg total) by mouth at bedtime as needed. 07/05/19   [place] Crane, MD  Vitamin D, Cholecalciferol, 25 MCG (1000 UT) TABS Take 1 tablet by mouth daily.     [provider]     Allergies Mirtazapine, Aspirin, Atomoxetine, Strattera [atomoxetine hcl], and Xyzal [levocetirizine dihydrochloride]    Social History Social History   Tobacco Use  . Smoking status: Former Smoker    Types: Cigarettes    Quit date: 09/08/1972    Years since quitting: 46.9  . Smokeless tobacco: Never Used  Substance Use Topics  . Alcohol use: Not Currently    Alcohol/week: 0.0 standard drinks    Comment: Occasional glass of wine  . Drug use: No    Review of Systems Patient denies headaches, rhinorrhea, blurry vision, numbness, shortness of breath, chest pain, edema, cough, abdominal pain, nausea, vomiting, diarrhea, dysuria, fevers, rashes or hallucinations unless otherwise stated above in HPI. ____________________________________________   PHYSICAL EXAM:  VITAL SIGNS: Vitals:   08/01/19 1622 08/01/19 1623  BP: 126/65   Pulse: 83   Resp: 16   Temp:  98 F (36.7 C)  SpO2: 99%  Constitutional: Alert and oriented.  Eyes: Conjunctivae are normal.  Head: Atraumatic. Nose: No congestion/rhinnorhea. Mouth/Throat: Mucous membranes are moist.   Neck: No stridor. Painless ROM.  Cardiovascular: bradycardic, regular rhythm. Grossly normal heart sounds.  Good peripheral circulation. Respiratory: Normal respiratory effort.  No retractions. Lungs CTAB. Gastrointestinal: Soft and nontender. No distention. No abdominal bruits. No CVA tenderness. Genitourinary:  Musculoskeletal: No lower extremity tenderness nor edema.  No joint effusions. Neurologic:  Normal speech and language. No gross focal neurologic deficits are appreciated. No facial droop Skin:  Skin is warm, dry and intact. No rash noted. Psychiatric: Mood and affect are normal. Speech and behavior are normal.  ____________________________________________   LABS (all labs ordered are listed, but only abnormal results are displayed)  Results for orders placed or performed during the hospital encounter of  08/01/19 (from the past 24 hour(s))  Basic metabolic panel     Status: Abnormal   Collection Time: 08/01/19  4:27 PM  Result Value Ref Range   Sodium 141 135 - 145 mmol/L   Potassium 4.8 3.5 - 5.1 mmol/L   Chloride 121 (H) 98 - 111 mmol/L   CO2 11 (L) 22 - 32 mmol/L   Glucose, Bld 116 (H) 70 - 99 mg/dL   BUN 123 (H) 8 - 23 mg/dL   Creatinine, Ser 7.32 (H) 0.61 - 1.24 mg/dL   Calcium 9.2 8.9 - 10.3 mg/dL   GFR calc non Af Amer 7 (L) >60 mL/min   GFR calc Af Amer 8 (L) >60 mL/min   Anion gap 9 5 - 15  CBC     Status: Abnormal   Collection Time: 08/01/19  4:27 PM  Result Value Ref Range   WBC 5.0 4.0 - 10.5 K/uL   RBC 2.89 (L) 4.22 - 5.81 MIL/uL   Hemoglobin 10.3 (L) 13.0 - 17.0 g/dL   HCT 31.4 (L) 39.0 - 52.0 %   MCV 108.7 (H) 80.0 - 100.0 fL   MCH 35.6 (H) 26.0 - 34.0 pg   MCHC 32.8 30.0 - 36.0 g/dL   RDW 13.8 11.5 - 15.5 %   Platelets 130 (L) 150 - 400 K/uL   nRBC 0.0 0.0 - 0.2 %  Lithium level     Status: Abnormal   Collection Time: 08/01/19  4:27 PM  Result Value Ref Range   Lithium Lvl 1.67 (HH) 0.60 - 1.20 mmol/L   ____________________________________________  EKG My review and personal interpretation at Time: 16:28   Indication: weakness  Rate: 40  Rhythm: sinus bradyacardia Axis: normal Other: motion artifact, nonspecific st abn, abnml ekg ____________________________________________  RADIOLOGY  I personally reviewed all radiographic images ordered to evaluate for the above acute complaints and reviewed radiology reports and findings.  These findings were personally discussed with the patient.  Please see medical record for radiology report.  ____________________________________________   PROCEDURES  Procedure(s) performed:  .Critical Care Performed by: Merlyn Lot, MD Authorized by: Merlyn Lot, MD   Critical care provider statement:    Critical care time (minutes):  45   Critical care was necessary to treat or prevent imminent or  life-threatening deterioration of the following conditions:  Renal failure and cardiac failure   Critical care was time spent personally by me on the following activities:  Discussions with consultants, evaluation of patient's response to treatment, examination of patient, ordering and performing treatments and interventions, ordering and review of laboratory studies, ordering and review of radiographic studies, pulse oximetry, re-evaluation of patient's condition, obtaining history from patient  or surrogate and review of old charts .1-3 Lead EKG Interpretation Performed by: Merlyn Lot, MD Authorized by: Merlyn Lot, MD     Interpretation: normal     ECG rate:  38   ECG rate assessment: bradycardic     Rhythm: sinus bradycardia     Ectopy: aberrant and PAC     Conduction: normal        Critical Care performed: yes ____________________________________________   INITIAL IMPRESSION / ASSESSMENT AND PLAN / ED COURSE  Pertinent labs & imaging results that were available during my care of the patient were reviewed by me and considered in my medical decision making (see chart for details).   DDX: Dehydration, electrolyte abnormality, cardiac, medication effect, toxicity, renal failure, anemia  Raydell Maners. is a 74 y.o. who presents to the ED with evidence of acute renal failure with bradycardia. Fortunately is normotensive and mentating well. Blood work does show evidence of acute renal failure metabolic acidosis but fortunately his potassium is normal. Will give aggressive IV hydration support suspect a component of dehydration. States he does still make urine. Is not currently on dialysis. Patient was placed on cardiac pacer pads due to significant bradycardia. He is mentating well. Started on IV hydration. I consulted with nephrology who recommends aggressive IV hydration admission to the hospital for further medical management. Will order ultrasound renal to evaluate for  any evidence of obstruction. His abdominal exam is otherwise soft benign. Have discussed with the patient and available family all diagnostics and treatments performed thus far and all questions were answered to the best of my ability. The patient demonstrates understanding and agreement with plan.       The patient was evaluated in Emergency Department today for the symptoms described in the history of present illness. He/she was evaluated in the context of the global COVID-19 pandemic, which necessitated consideration that the patient might be at risk for infection with the SARS-CoV-2 virus that causes COVID-19. Institutional protocols and algorithms that pertain to the evaluation of patients at risk for COVID-19 are in a state of rapid change based on information released by regulatory bodies including the CDC and federal and state organizations. These policies and algorithms were followed during the patient's care in the ED.  As part of my medical decision making, I reviewed the following data within the [place] notes reviewed and incorporated, Labs reviewed, notes from prior ED visits and [place] Controlled Substance Database   ____________________________________________   FINAL CLINICAL IMPRESSION(S) / ED DIAGNOSES  Final diagnoses:  Acute renal failure, unspecified acute renal failure type ([place])  Bradycardia      NEW MEDICATIONS STARTED DURING THIS VISIT:  New Prescriptions   No medications on file     Note:  This document was prepared using Dragon voice recognition software and may include unintentional dictation errors.    Merlyn Lot, MD 08/01/19 [phone]

## 2019-08-01 NOTE — ED Notes (Signed)
Pt provided w/ sandwich box and ginger ale.

## 2019-08-01 NOTE — ED Triage Notes (Signed)
Pt here from his primary provider's office due to not feeling well and bradycardia. Pt NAD.

## 2019-08-02 ENCOUNTER — Ambulatory Visit: Payer: Medicare Other | Admitting: Family Medicine

## 2019-08-02 ENCOUNTER — Encounter: Payer: Self-pay | Admitting: Family Medicine

## 2019-08-02 ENCOUNTER — Observation Stay (HOSPITAL_COMMUNITY)
Admit: 2019-08-02 | Discharge: 2019-08-02 | Disposition: A | Discharge status: Home / Self Care | Payer: Medicare Other | Attending: Internal Medicine | Admitting: Internal Medicine

## 2019-08-02 DIAGNOSIS — T56894A Toxic effect of other metals, undetermined, initial encounter: Secondary | ICD-10-CM

## 2019-08-02 DIAGNOSIS — I498 Other specified cardiac arrhythmias: Secondary | ICD-10-CM

## 2019-08-02 DIAGNOSIS — N179 Acute kidney failure, unspecified: Secondary | ICD-10-CM | POA: Diagnosis present

## 2019-08-02 DIAGNOSIS — R001 Bradycardia, unspecified: Secondary | ICD-10-CM

## 2019-08-02 DIAGNOSIS — N184 Chronic kidney disease, stage 4 (severe): Secondary | ICD-10-CM

## 2019-08-02 DIAGNOSIS — I4581 Long QT syndrome: Secondary | ICD-10-CM | POA: Diagnosis present

## 2019-08-02 DIAGNOSIS — T43505A Adverse effect of unspecified antipsychotics and neuroleptics, initial encounter: Secondary | ICD-10-CM | POA: Diagnosis not present

## 2019-08-02 DIAGNOSIS — N281 Cyst of kidney, acquired: Secondary | ICD-10-CM | POA: Diagnosis present

## 2019-08-02 DIAGNOSIS — Z85820 Personal history of malignant melanoma of skin: Secondary | ICD-10-CM | POA: Diagnosis not present

## 2019-08-02 DIAGNOSIS — N170 Acute kidney failure with tubular necrosis: Principal | ICD-10-CM

## 2019-08-02 DIAGNOSIS — G4700 Insomnia, unspecified: Secondary | ICD-10-CM | POA: Diagnosis present

## 2019-08-02 DIAGNOSIS — M109 Gout, unspecified: Secondary | ICD-10-CM | POA: Diagnosis present

## 2019-08-02 DIAGNOSIS — G4733 Obstructive sleep apnea (adult) (pediatric): Secondary | ICD-10-CM | POA: Diagnosis present

## 2019-08-02 DIAGNOSIS — N2581 Secondary hyperparathyroidism of renal origin: Secondary | ICD-10-CM | POA: Diagnosis present

## 2019-08-02 DIAGNOSIS — F3176 Bipolar disorder, in full remission, most recent episode depressed: Secondary | ICD-10-CM

## 2019-08-02 DIAGNOSIS — Z6831 Body mass index (BMI) 31.0-31.9, adult: Secondary | ICD-10-CM | POA: Diagnosis not present

## 2019-08-02 DIAGNOSIS — I129 Hypertensive chronic kidney disease with stage 1 through stage 4 chronic kidney disease, or unspecified chronic kidney disease: Secondary | ICD-10-CM | POA: Diagnosis not present

## 2019-08-02 DIAGNOSIS — T43595A Adverse effect of other antipsychotics and neuroleptics, initial encounter: Secondary | ICD-10-CM | POA: Diagnosis present

## 2019-08-02 DIAGNOSIS — R197 Diarrhea, unspecified: Secondary | ICD-10-CM | POA: Diagnosis not present

## 2019-08-02 DIAGNOSIS — T56891A Toxic effect of other metals, accidental (unintentional), initial encounter: Secondary | ICD-10-CM

## 2019-08-02 DIAGNOSIS — D631 Anemia in chronic kidney disease: Secondary | ICD-10-CM | POA: Diagnosis present

## 2019-08-02 DIAGNOSIS — I442 Atrioventricular block, complete: Secondary | ICD-10-CM | POA: Diagnosis present

## 2019-08-02 DIAGNOSIS — E870 Hyperosmolality and hypernatremia: Secondary | ICD-10-CM | POA: Diagnosis not present

## 2019-08-02 DIAGNOSIS — Z20822 Contact with and (suspected) exposure to covid-19: Secondary | ICD-10-CM | POA: Diagnosis present

## 2019-08-02 DIAGNOSIS — I10 Essential (primary) hypertension: Secondary | ICD-10-CM | POA: Diagnosis not present

## 2019-08-02 DIAGNOSIS — I959 Hypotension, unspecified: Secondary | ICD-10-CM | POA: Diagnosis present

## 2019-08-02 DIAGNOSIS — E871 Hypo-osmolality and hyponatremia: Secondary | ICD-10-CM | POA: Diagnosis not present

## 2019-08-02 DIAGNOSIS — E860 Dehydration: Secondary | ICD-10-CM | POA: Diagnosis present

## 2019-08-02 DIAGNOSIS — I120 Hypertensive chronic kidney disease with stage 5 chronic kidney disease or end stage renal disease: Secondary | ICD-10-CM | POA: Diagnosis present

## 2019-08-02 DIAGNOSIS — E669 Obesity, unspecified: Secondary | ICD-10-CM | POA: Diagnosis present

## 2019-08-02 DIAGNOSIS — E872 Acidosis: Secondary | ICD-10-CM | POA: Diagnosis present

## 2019-08-02 DIAGNOSIS — R809 Proteinuria, unspecified: Secondary | ICD-10-CM | POA: Diagnosis not present

## 2019-08-02 DIAGNOSIS — N185 Chronic kidney disease, stage 5: Secondary | ICD-10-CM | POA: Diagnosis present

## 2019-08-02 HISTORY — DX: Acute kidney failure, unspecified: N17.9

## 2019-08-02 LAB — BASIC METABOLIC PANEL
Anion gap: 6 (ref 5–15)
BUN: 123 mg/dL — ABNORMAL HIGH (ref 8–23)
CO2: 12 mmol/L — ABNORMAL LOW (ref 22–32)
Calcium: 8.5 mg/dL — ABNORMAL LOW (ref 8.9–10.3)
Chloride: 124 mmol/L — ABNORMAL HIGH (ref 98–111)
Creatinine, Ser: 7.01 mg/dL — ABNORMAL HIGH (ref 0.61–1.24)
GFR calc Af Amer: 8 mL/min — ABNORMAL LOW (ref >60)
GFR calc non Af Amer: 7 mL/min — ABNORMAL LOW (ref >60)
Glucose, Bld: 150 mg/dL — ABNORMAL HIGH (ref 70–99)
Potassium: 4.6 mmol/L (ref 3.5–5.1)
Sodium: 142 mmol/L (ref 135–145)

## 2019-08-02 LAB — MAGNESIUM: Magnesium: 3 mg/dL — ABNORMAL HIGH (ref 1.7–2.4)

## 2019-08-02 LAB — HEPATITIS B CORE ANTIBODY, TOTAL: Hep B Core Total Ab: NONREACTIVE

## 2019-08-02 LAB — MRSA PCR SCREENING: MRSA by PCR: NEGATIVE

## 2019-08-02 LAB — ECHOCARDIOGRAM COMPLETE
Height: 68 in
Weight: 3241.64 oz

## 2019-08-02 LAB — HEPATITIS C ANTIBODY: HCV Ab: NONREACTIVE

## 2019-08-02 LAB — HEPATITIS B SURFACE ANTIGEN: Hepatitis B Surface Ag: NONREACTIVE

## 2019-08-02 LAB — HEPATITIS B SURFACE ANTIBODY,QUALITATIVE: Hep B S Ab: NONREACTIVE

## 2019-08-02 LAB — LITHIUM LEVEL: Lithium Lvl: 1.66 mmol/L (ref 0.60–1.20)

## 2019-08-02 LAB — GLUCOSE, CAPILLARY
Glucose-Capillary: 84 mg/dL (ref 70–99)
Glucose-Capillary: 172 mg/dL — ABNORMAL HIGH (ref 70–99)

## 2019-08-02 MED ORDER — CHLORHEXIDINE GLUCONATE CLOTH 2 % EX PADS
6.0000 | MEDICATED_PAD | Freq: Every day | CUTANEOUS | Status: DC
  Administered 2019-08-02 – 2019-08-04 (×3): 6 via TOPICAL

## 2019-08-02 MED ORDER — CALCIUM GLUCONATE-NACL 1-0.675 GM/50ML-% IV SOLN
1.0000 g | Freq: Once | INTRAVENOUS | Status: AC
  Administered 2019-08-02: 1000 mg via INTRAVENOUS
  Filled 2019-08-02: qty 50

## 2019-08-02 MED ORDER — ALTEPLASE 2 MG IJ SOLR: 2.0000 mg | Freq: Once | INTRAMUSCULAR | Status: DC | PRN

## 2019-08-02 MED ORDER — HEPARIN SODIUM (PORCINE) 1000 UNIT/ML DIALYSIS
1000.0000 [IU] | INTRAMUSCULAR | Status: DC | PRN
  Administered 2019-08-02: 3600 [IU] via INTRAVENOUS_CENTRAL
  Filled 2019-08-02 (×2): qty 1

## 2019-08-02 MED ORDER — SODIUM CHLORIDE 0.9 % IV SOLN: 100.0000 mL | INTRAVENOUS | Status: DC | PRN

## 2019-08-02 MED ORDER — LOPERAMIDE HCL 2 MG PO CAPS
2.0000 mg | ORAL_CAPSULE | ORAL | Status: DC | PRN
  Administered 2019-08-02 – 2019-08-05 (×4): 2 mg via ORAL
  Filled 2019-08-02 (×4): qty 1

## 2019-08-02 MED ORDER — LIDOCAINE HCL (PF) 1 % IJ SOLN
5.0000 mL | INTRAMUSCULAR | Status: DC | PRN
  Filled 2019-08-02: qty 5

## 2019-08-02 MED ORDER — LIDOCAINE-PRILOCAINE 2.5-2.5 % EX CREA
1.0000 "application " | TOPICAL_CREAM | CUTANEOUS | Status: DC | PRN
  Filled 2019-08-02: qty 5

## 2019-08-02 MED ORDER — PENTAFLUOROPROP-TETRAFLUOROETH EX AERO
1.0000 "application " | INHALATION_SPRAY | CUTANEOUS | Status: DC | PRN
  Filled 2019-08-02: qty 30

## 2019-08-02 MED ORDER — ATROPINE SULFATE 1 MG/10ML IJ SOSY
0.5000 mg | PREFILLED_SYRINGE | Freq: Once | INTRAMUSCULAR | Status: AC
  Administered 2019-08-02: 0.5 mg via INTRAVENOUS
  Filled 2019-08-02: qty 10

## 2019-08-02 NOTE — Plan of Care (Signed)
Recently admitted from ED

## 2019-08-02 NOTE — Progress Notes (Signed)
Luis Miller.  MRN: [id number]  DOB/AGE: 1945/03/24 74 y.o.  Primary Care Physician:Lane, Lilia Argue, PA-C  Admit date: 08/01/2019  Chief Complaint:  Chief Complaint  Patient presents with  . Bradycardia    S-Pt presented on  08/01/2019 with  Chief Complaint  Patient presents with  . Bradycardia  . Patient is lying comfortably in the ICU bed. I then discussed patient vitals-has low heart rate, high lithium level, high BUN level and need for renal replacement therapy.After extensive discussion patient was willing to start early replacement therapy. We will ask for vascular/pulmonary critical care help  Medications . Chlorhexidine Gluconate Cloth  6 each Topical Daily  . sodium chloride flush  3 mL Intravenous Q12H         LOV:FIEPP from the symptoms mentioned above,there are no other symptoms referable to all systems reviewed.  Physical Exam: Vital signs in last 24 hours: Temp:  [97.5 F (36.4 C)-99 F (37.2 C)] 98 F (36.7 C) (05/13 0758) Pulse Rate:  [25-95] 29 (05/13 0710) Resp:  [11-39] 15 (05/13 0758) BP: (72-143)/(35-106) 109/94 (05/13 0758) SpO2:  [94 %-100 %] 100 % (05/13 0758) Weight:  [89.8 kg-91.9 kg] 91.9 kg (05/13 0758) Weight change:     Intake/Output from previous day: 05/12 0701 - 05/13 0700 In: 550 [IV Piggyback:550] Out: -  Total I/O In: 120 [P.O.:120] Out: 375 [Urine:375]   Physical Exam: General- pt is awake,alert, oriented to time place and person Resp- No acute REsp distress, CTA B/L NO Rhonchi CVS- S1S2 regular in rate and rhythm but bradycardic GIT- BS+, soft, NT, ND EXT- 1+ LE Edema, Cyanosis Access patient has temporary dialysis catheter in situ  Lab Results: CBC Recent Labs    08/01/19 1627  WBC 5.0  HGB 10.3*  HCT 31.4*  PLT 130*    BMET Recent Labs    08/01/19 1627 08/02/19 0249  NA 141 142  K 4.8 4.6  CL 121* 124*  CO2 11* 12*  GLUCOSE 116* 150*  BUN 123* 123*  CREATININE 7.32* 7.01*   CALCIUM 9.2 8.5*    Creat trend 2021  3.8==>7.0  2020 2.8 2019 2.7--3.1 2018 2.3 2017 2.2-2.4    Lithium 1.67   MICRO Recent Results (from the past 240 hour(s))  SARS Coronavirus 2 by RT PCR (hospital order, performed in [facility] hospital lab) Nasopharyngeal Nasopharyngeal Swab     Status: None   Collection Time: 08/01/19  6:24 PM   Specimen: Nasopharyngeal Swab  Result Value Ref Range Status   SARS Coronavirus 2 NEGATIVE NEGATIVE Final    Comment: (NOTE) SARS-CoV-2 target nucleic acids are NOT DETECTED. The SARS-CoV-2 RNA is generally detectable in upper and lower respiratory specimens during the acute phase of infection. The lowest concentration of SARS-CoV-2 viral copies this assay can detect is 250 copies / mL. A negative result does not preclude SARS-CoV-2 infection and should not be used as the sole basis for treatment or other patient management decisions.  A negative result may occur with improper specimen collection / handling, submission of specimen other than nasopharyngeal swab, presence of viral mutation(s) within the areas targeted by this assay, and inadequate number of viral copies (<250 copies / mL). A negative result must be combined with clinical observations, patient history, and epidemiological information. Fact Sheet for Patients:   StrictlyIdeas.no Fact Sheet for Healthcare Providers: BankingDealers.co.za This test is not yet approved or cleared  by the [place] FDA and has been authorized for detection and/or diagnosis of SARS-CoV-2  by FDA under an Emergency Use Authorization (EUA).  This EUA will remain in effect (meaning this test can be used) for the duration of the COVID-19 declaration under Section 564(b)(1) of the Act, 21 U.S.C. section 360bbb-3(b)(1), unless the authorization is terminated or revoked sooner. Performed at [facility], [address] [place], [place]  [address]   MRSA PCR Screening     Status: None   Collection Time: 08/02/19  8:03 AM   Specimen: Nasopharyngeal  Result Value Ref Range Status   MRSA by PCR NEGATIVE NEGATIVE Final    Comment:        The GeneXpert MRSA Assay (FDA approved for NASAL specimens only), is one component of a comprehensive MRSA colonization surveillance program. It is not intended to diagnose MRSA infection nor to guide or monitor treatment for MRSA infections. Performed at [facility], [address] [place], [place] [address]       Lab Results  Component Value Date   CALCIUM 8.5 (L) 08/02/2019               Impression:   Patient is a 74 year old Caucasian male with a past medical history of CKD stage IV, bipolar disorder, hypertension who came to the ER with chief complaint of low heart rate patient was admitted for acute kidney injury and bradycardia  1)Renal  AKI secondary to ATN Patient has CKD stage IV Patient has CKD stage IV most likely secondary to long-term lithium use/there could be some contribution from his history of hypertension Patient currently has BUN of more than 100 and patient heart rate is on the lower side. After extensive discussion with the patient about need benefit risk of renal replacement therapy patient was willing to start dialysis    2)HTN Blood pressure is stable   3)Anemia of chronic disease  HGb at goal (9--11)   4) secondary hyperparathyroidism -CKD Mineral-Bone Disorder   Secondary Hyperparathyroidism present Patient outpatient PTH level was elevated at 85  Phosphorus at goal.   5) lithium toxicity Patient lithium level is on the higher side Patient was being hydrated with lithium level remains high Renal placement therapy/dialysis should help   6) electrolytes   sodium Normonatremic   potassium Normokalemic    7)Acid base Co2 at goal     Plan:   I have asked for help from a pulmonary critical care  regarding need for temporary catheter. We will dialyze patient today. Patient will most likely need another dialysis tomorrow. Patient will most likely be transitioned to PD as an outpatient.     Lucy Boardman s Theador Hawthorne 08/02/2019, 10:58 AM

## 2019-08-02 NOTE — ED Notes (Signed)
NP Ouma to pts bedside

## 2019-08-02 NOTE — ED Notes (Addendum)
NP ouma notified again of vitals, concerning low BP and Low HR. No orders at this time

## 2019-08-02 NOTE — Procedures (Signed)
Central Venous Dailysis Catheter Placement: TRIPLE  LUMEN  Indication: Hemo Dialysis/CRRT   Consent:WRITTEN   Hand washing performed prior to starting the procedure.   Procedure: An active timeout was performed and correct patient, name, & ID confirmed. Patient was positioned correctly for central venous access. Patient was prepped using strict sterile technique including chlorohexadine preps, sterile drape, sterile gown and sterile gloves.  The area was prepped, draped and anesthetized in the usual sterile manner. Patient comfort was obtained.    A Double lumen catheter was placed in RT FEMORAL  Vein There was good blood return, catheter caps were placed on lumens, catheter flushed easily, the line was secured and a sterile dressing and BIO-PATCH applied.   Ultrasound was used to visualize vasculature and guidance of needle.   Number of Attempts: 1 Complications:none  Estimated Blood Loss: none Operator: Daley Mooradian.   Corrin Parker, M.D.  [facility]  Medical Director [place] Director [facility] Cardio-Pulmonary Department

## 2019-08-02 NOTE — Progress Notes (Signed)
PROGRESS NOTE    Luis Miller.  KCL:[id number] DOB: 1945/07/05 DOA: 08/01/2019 PCP: Volney American, PA-C      Brief Narrative:  Luis Miller is a 74 y.o. 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  Patient had had 2 weeks generalized malaise, decreased UOP, "shakes", dizziness.  Went to [facility] office, found to have bradycardia, sent to ER.  In the ER, HR 49 but BP 126/65.  Cr 7.32, BUN 123, Bicab 11, lithium level 1.67.         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  Now with signs and symptoms of renal failure (bicarb 11, BUN 123, lithium toxicity).  Cr slightly down yesterday, no UOP documented. -Strict I/Os -Continue IV fluids -Consult Nephrology   Lithium toxicity -Hold lithium -HD per Nephrology  Bradycardia Lithium can induce sinus node dysfunction, which can be permanent.  Had severe bradycardia to 25 bpm overnight, ECG shows a junctional rhythm.  Discussed with Cardiology, I think he is primarily symptomatic from lithium, not bradycardia.   -Monitor on telemetry -Atropine as needed, IV fluids for hypotension  -Consult Cardiology  -Hold Lithium -HD per nephrology  Hypertension BP <49 systolic overnight due to bradycardia -Hold hydralazine, amiloride, amlodipine, benazepril   Bipolar disorder -Hold Lithium, Seroquel  Obesity BMI 31  Gout -Hold allopurinol       Disposition: Status is: Inpatient  Remains inpatient appropriate because:Remains hemodynamically stable, heart rate in the 40s, renal failure, and altered mentation, tremor.   Dispo: The patient is from: Home              Anticipated d/c is to: Possibly home              Anticipated d/c date is: > 3 days              Patient currently is not medically stable to  d/c.                    MDM: The patient is critically ill with multi-organ failure.  Critical care was necessary to treat or prevent imminent or life-threatening deterioration of bradycardia requiring atropine, cardiac failure, renal failure and was exclusive of separately billable procedures and treating other patients. Total critical care time spent by me: 45 minutes Time spent personally by me on obtaining history from patient or surrogate, evaluation of the patient, evaluation of patient's response to treatment, ordering and review of laboratory studies, ordering and review of radiographic studies, ordering and performing treatments and interventions, and re-evaluation of the patient's condition.   DVT prophylaxis: SCDs Code Status: FULL Family Communication:     Consultants:   Cardiology  nephrology  Procedures:     Antimicrobials:      Culture data:              Subjective: The patient still has tremor, disorientation, malaise, ataxia.  He has no dizziness, syncope.  No chest pain.  No confusion.  No vomiting, respiratory distress.  Objective: Vitals:   08/02/19 0700 08/02/19 0710 08/02/19 0722 08/02/19 0758  BP: (!) 133/58 (!) 124/52  (!) 109/94  Pulse: 74 (!) 29    Resp:  (!) 35  15  Temp:   99 F (37.2 C) 98 F (36.7 C)  TempSrc:   Axillary Oral  SpO2: 98% 97%  100%  Weight:    91.9 kg  Height:    5' 8"  (1.727 m)    Intake/Output Summary (Last 24 hours) at 08/02/2019 0929 Last data filed at 08/02/2019 0421 Gross per 24 hour  Intake 550 ml  Output --  Net 550 ml   Filed Weights   08/01/19 1624 08/02/19 0758  Weight: 89.8 kg 91.9 kg    Examination: General appearance: Obese adult male, alert and in no acute distress.,  Appears tired, weak.  Lying in bed. HEENT: Anicteric, conjunctiva pink, lids and lashes normal. No nasal deformity, discharge, epistaxis.  Lips moist, dentition normal, oropharynx moist, no oral lesions,  hearing normal.   Skin: Warm and dry.  No jaundice.  No suspicious rashes or lesions. Cardiac: Bradycardic, irregular, nl S1-S2, no murmurs appreciated.  Capillary refill is brisk.  JVP not visible.  1+ bilateral LE edema.  Radial  pulses 2+ and symmetric. Respiratory: Normal respiratory rate and rhythm.  CTAB without rales or wheezes. Abdomen: Abdomen soft.  No TTP or guarding. No ascites, distension, hepatosplenomegaly.   MSK: No deformities or effusions. Neuro: Awake and alert.  EOMI, moves all extremities with severe generalized weakness. Speech slow and halting, no dysarthria.  Tremor noted. Psych: Sensorium intact and responding to questions, attention diminished, affect blunted.  Judgment and insight appear normal.    Data Reviewed: I have personally reviewed following labs and imaging studies:  CBC: Recent Labs  Lab 08/01/19 1627  WBC 5.0  HGB 10.3*  HCT 31.4*  MCV 108.7*  PLT 161*   Basic Metabolic Panel: Recent Labs  Lab 08/01/19 1627 08/02/19 0249  NA 141 142  K 4.8 4.6  CL 121* 124*  CO2 11* 12*  GLUCOSE 116* 150*  BUN 123* 123*  CREATININE 7.32* 7.01*  CALCIUM 9.2 8.5*  MG 2.9* 3.0*   GFR: Estimated Creatinine Clearance: 10.2 mL/min (A) (by C-G formula based on SCr of 7.01 mg/dL (H)). Liver Function Tests: No results for input(s): AST, ALT, ALKPHOS, BILITOT, PROT, ALBUMIN in the last 168 hours. No results for input(s): LIPASE, AMYLASE in the last 168 hours. No results for input(s): AMMONIA in the last 168 hours. Coagulation Profile: No results for input(s): INR, PROTIME in the last 168 hours. Cardiac Enzymes: No results for input(s): CKTOTAL, CKMB, CKMBINDEX, TROPONINI in the last 168 hours. BNP (last 3 results) No results for input(s): PROBNP in the last 8760 hours. HbA1C: No results for input(s): HGBA1C in the last 72 hours. CBG: Recent Labs  Lab 08/02/19 0759  GLUCAP 84   Lipid Profile: No results for input(s): CHOL, HDL, LDLCALC, TRIG,  CHOLHDL, LDLDIRECT in the last 72 hours. Thyroid Function Tests: Recent Labs    08/01/19 1627  TSH 1.226   Anemia Panel: No results for input(s): VITAMINB12, FOLATE, FERRITIN, TIBC, IRON, RETICCTPCT in the last 72 hours. Urine analysis:    Component Value Date/Time   COLORURINE YELLOW (A) 08/01/2019 1824   APPEARANCEUR HAZY (A) 08/01/2019 1824   APPEARANCEUR Clear 04/24/2018 0835   LABSPEC 1.011 08/01/2019 1824   PHURINE 5.0 08/01/2019 1824   GLUCOSEU NEGATIVE 08/01/2019 1824   HGBUR NEGATIVE 08/01/2019 1824   BILIRUBINUR NEGATIVE 08/01/2019 1824   BILIRUBINUR Negative 04/24/2018 [place] 08/01/2019 1824   PROTEINUR 30 (A) 08/01/2019 1824   NITRITE NEGATIVE 08/01/2019 1824   LEUKOCYTESUR NEGATIVE 08/01/2019 1824   Sepsis Labs: @LABRCNTIP (procalcitonin:4,lacticacidven:4)  ) Recent Results (from the past 240 hour(s))  SARS Coronavirus 2 by RT  PCR (hospital order, performed in [facility] hospital lab) Nasopharyngeal Nasopharyngeal Swab     Status: None   Collection Time: 08/01/19  6:24 PM   Specimen: Nasopharyngeal Swab  Result Value Ref Range Status   SARS Coronavirus 2 NEGATIVE NEGATIVE Final    Comment: (NOTE) SARS-CoV-2 target nucleic acids are NOT DETECTED. The SARS-CoV-2 RNA is generally detectable in upper and lower respiratory specimens during the acute phase of infection. The lowest concentration of SARS-CoV-2 viral copies this assay can detect is 250 copies / mL. A negative result does not preclude SARS-CoV-2 infection and should not be used as the sole basis for treatment or other patient management decisions.  A negative result may occur with improper specimen collection / handling, submission of specimen other than nasopharyngeal swab, presence of viral mutation(s) within the areas targeted by this assay, and inadequate number of viral copies (<250 copies / mL). A negative result must be combined with clinical observations, patient history,  and epidemiological information. Fact Sheet for Patients:   StrictlyIdeas.no Fact Sheet for Healthcare Providers: BankingDealers.co.za This test is not yet approved or cleared  by the [place] FDA and has been authorized for detection and/or diagnosis of SARS-CoV-2 by FDA under an Emergency Use Authorization (EUA).  This EUA will remain in effect (meaning this test can be used) for the duration of the COVID-19 declaration under Section 564(b)(1) of the Act, 21 U.S.C. section 360bbb-3(b)(1), unless the authorization is terminated or revoked sooner. Performed at [facility], [address] [place], [place] [address]   MRSA PCR Screening     Status: None   Collection Time: 08/02/19  8:03 AM   Specimen: Nasopharyngeal  Result Value Ref Range Status   MRSA by PCR NEGATIVE NEGATIVE Final    Comment:        The GeneXpert MRSA Assay (FDA approved for NASAL specimens only), is one component of a comprehensive MRSA colonization surveillance program. It is not intended to diagnose MRSA infection nor to guide or monitor treatment for MRSA infections. Performed at [facility], [address], [place], [place] [address]          Radiology Studies: US Renal  Result Date: 08/01/2019 CLINICAL DATA:  Acute renal failure EXAM: RENAL / URINARY TRACT ULTRASOUND COMPLETE COMPARISON:  03/19/2008 FINDINGS: Right Kidney: Renal measurements: 9.5 x 5.3 x 5.3 cm. = volume: 139 mL. Diffuse increased echogenicity is noted. Cortical thinning is seen. 4.6 cm cyst is noted in the upper pole of the right kidney. Left Kidney: Renal measurements: 10.0 x 6.2 x 4.7 cm = volume: 151 mL. Increased echogenicity is noted with cortical thinning. Multiple cysts are seen the largest of which measures 1.8 cm. Bladder: Appears normal for degree of bladder distention. Other: None. IMPRESSION: Bilateral renal cysts simple in nature. Increased  echogenicity and cortical thinning consistent with medical renal disease. No obstructive changes are seen. Electronically Signed   By: Inez Catalina M.D.   On: 08/01/2019 19:33   DG Chest Portable 1 View  Result Date: 08/01/2019 CLINICAL DATA:  Acute renal fat, bradycardia, rectal bleeding, chronic kidney disease, Crohn's disease, hypertension EXAM: PORTABLE CHEST 1 VIEW COMPARISON:  Portable exam 1828 hours compared to 09/17/2010 FINDINGS: External pacing leads project over chest. Enlargement of cardiac silhouette. Mediastinal contours and pulmonary vascularity normal. Decreased lung volumes without definite pulmonary infiltrate, pleural effusion or pneumothorax. Multiple old BILATERAL rib fractures. IMPRESSION: Enlargement of cardiac silhouette. Low lung volumes without acute infiltrate. Electronically Signed   By:  Lavonia Dana M.D.   On: 08/01/2019 19:04        Scheduled Meds: . Chlorhexidine Gluconate Cloth  6 each Topical Daily  . sodium chloride flush  3 mL Intravenous Q12H   Continuous Infusions:   LOS: 0 days    Time spent: 45 minutes    Edwin Dada, MD [facility] 08/02/2019, 9:29 AM     Please page though [place] or Epic secure chat:  For [employer], Adult nurse

## 2019-08-02 NOTE — Progress Notes (Signed)
Pt is post dialysis treatment. Looks and feels much better he says. HR maintaining in the 50s this evening and BP stable. Pt is alert and oriented. Chaplain at bedside at this time. Pt resting in bed with no distress.

## 2019-08-02 NOTE — Plan of Care (Deleted)
Recently admitted.

## 2019-08-02 NOTE — ED Notes (Signed)
Taken to ICU by this RN. Pt does not report SOB and does not wear oxygen at home, wears CPAP at night. Pt transferred on RA 96%. NS infusion transferred and all of belongings taken to unit by this RN. No acute events during transfer.

## 2019-08-02 NOTE — ED Notes (Signed)
Report to Lauren, RN

## 2019-08-02 NOTE — Consult Note (Signed)
Cardiology Consultation:   Patient ID: Luis Marcy.; [id number]; 09/21/1945   Admit date: 08/01/2019 Date of Consult: 08/02/2019  Primary Care Provider: Volney American, PA-C Primary Cardiologist: new to [facility] - consult by [place]   Patient Profile:   Luis Coss. is a 73 y.o. male with a hx of CKD stage IV, anemia of chronic disease, bipolar disorder on lithium, and HTN who is being seen today for the evaluation of junctional bradycardia at the request of Ms. Stark Klein, NP.  History of Present Illness:   Mr. Varnell has no previously known cardiac history.  He presented to the ED from his PCPs office on 5/12 for evaluation of bradycardia with heart rates in the 30s bpm and just generally not feeling well with noted unsteady gait for the past 2 weeks.  He denies any dizziness, presyncope, or syncope.  No falls.  No chest pain or shortness of breath.  He was noted to be in a junctional rhythm versus A. fib with slow ventricular response upon arrival with rates in the 30s to 40s bpm with a stable BP upon arrival.  Lithium level noted to be 1.67 upon arrival.  Renal function showed acute renal failure with a BUN of 123 and serum creatinine of 7.32 with a baseline around 3.7-5.3.  Potassium 4.8 trending to 4.6.  Magnesium 3.0.  TSH normal.  Overnight, he did have a couple of isolated hypotensive BP readings into the 63F to 35K systolic and was given atropine x1 as well as calcium gluconate.  He has not required further doses of atropine or initiation of dopamine drip.  Currently he has a MAP in the 80s.   Past Medical History:  Diagnosis Date  . Anemia   . Apnea, sleep   . Arthritis   . Bipolar affective ([place])   . Cancer ([place]) malignant melanoma  . CRI (chronic renal insufficiency)   . Crohn's disease ([place])   . Depression   . Diabetes insipidus ([place])   . Erectile dysfunction   . Gout   . Hyperlipidemia   . Hypertension   . IBS (irritable bowel syndrome)   . Impotence   .  Internal hemorrhoids   . 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[place])   . Paraphimosis   . Peyronie disease   . Secondary hyperparathyroidism of renal origin ([place])   . Skin cancer   . Testicular hypofunction   . Urinary frequency   . Urinary hesitancy     Past Surgical History:  Procedure Laterality Date  . arm fracture    . BONE MARROW BIOPSY    . COLONOSCOPY WITH PROPOFOL N/A 12/23/2014   Procedure: COLONOSCOPY WITH PROPOFOL;  Surgeon: Manya Silvas, MD;  Location: [facility] ENDOSCOPY;  Service: Endoscopy;  Laterality: N/A;  . COLONOSCOPY WITH PROPOFOL N/A 02/06/2018   Procedure: COLONOSCOPY WITH PROPOFOL;  Surgeon: Manya Silvas, MD;  Location: [facility] ENDOSCOPY;  Service: Endoscopy;  Laterality: N/A;  . KNEE ARTHROPLASTY Left 02/22/2018   Procedure: COMPUTER ASSISTED TOTAL KNEE ARTHROPLASTY;  Surgeon: Dereck Leep, MD;  Location: [facility] ORS;  Service: Orthopedics;  Laterality: Left;  Marland Kitchen MELANOMA EXCISION    . PENILE PROSTHESIS IMPLANT    . PENILE PROSTHESIS IMPLANT N/A 08/25/2015   Procedure: REPLACEMENT OF INFLATABLE PENILE PROSTHESIS COMPONENTS;  Surgeon: Cleon Gustin, MD;  Location: [facility] ORS;  Service: Urology;  Laterality: N/A;  . SKIN CANCER EXCISION     nose  . TONSILLECTOMY    . VASECTOMY  Home Meds: Prior to Admission medications   Medication Sig Start Date End Date Taking? Authorizing Provider  allopurinol (ZYLOPRIM) 100 MG tablet Take 200 mg by mouth daily.  12/11/15  Yes [provider]  aMILoride (MIDAMOR) 5 MG tablet Take 1 tablet (5 mg total) by mouth daily. 04/27/19  Yes Volney American, PA-C  amLODipine (NORVASC) 10 MG tablet Take 1 tablet (10 mg total) by mouth daily. 04/27/19  Yes Volney American, PA-C  benazepril (LOTENSIN) 40 MG tablet Take 40 mg by mouth daily.  08/27/14  Yes [provider]  colchicine 0.6 MG tablet Take 0.5 tablets (0.3 mg total) by mouth daily as needed. 06/28/19  Yes Volney American, PA-C  cyproheptadine  (PERIACTIN) 4 MG tablet Take one tablet three hours prior to intercourse Patient taking differently: Take 4 mg by mouth daily as needed. (take 3 hours before intercourse) 12/15/16  Yes McGowan, Larene Beach A, PA-C  hydrALAZINE (APRESOLINE) 100 MG tablet Take 100 mg by mouth 3 (three) times daily.   Yes [provider]  lithium carbonate (LITHOBID) 300 MG CR tablet Take 1 tablet (300 mg total) by mouth at bedtime. 07/05/19  Yes [place] Crane, MD  loperamide (IMODIUM) 2 MG capsule Take 2 mg by mouth as needed for diarrhea or loose stools.   Yes [provider]  Multiple Vitamin (MULTIVITAMIN) tablet Take 1 tablet by mouth daily.   Yes [provider]  QUEtiapine (SEROQUEL) 25 MG tablet Take 25 mg by mouth at bedtime.   Yes [provider]  traZODone (DESYREL) 50 MG tablet Take 1 tablet (50 mg total) by mouth at bedtime as needed. Patient taking differently: Take 50 mg by mouth at bedtime as needed for sleep.  07/05/19  Yes [place] Crane, MD  Vitamin D, Cholecalciferol, 25 MCG (1000 UT) TABS Take 1,000 tablets by mouth daily.    Yes [provider]  OLANZapine (ZYPREXA) 2.5 MG tablet Take 1 tablet (2.5 mg total) by mouth at bedtime. Patient not taking: Reported on 08/01/2019 07/05/19   [place] Crane, MD    Inpatient Medications: Scheduled Meds: . Chlorhexidine Gluconate Cloth  6 each Topical Daily  . sodium chloride flush  3 mL Intravenous Q12H   Continuous Infusions:  PRN Meds: acetaminophen **OR** acetaminophen, ondansetron **OR** ondansetron (ZOFRAN) IV  Allergies:   Allergies  Allergen Reactions  . Mirtazapine Rash  . Aspirin     Other reaction(s): Unknown Patient was instructed not to take aspirin but can not remember why  . Atomoxetine Other (See Comments)    Urinary sx  . Strattera [Atomoxetine Hcl] Other (See Comments)    Urinary sx  . Xyzal [Levocetirizine Dihydrochloride] Rash    Patient reported on 11/02/17    Social History:   Social  History   Socioeconomic History  . Marital status: Divorced    Spouse name: Not on file  . Number of children: 2  . Years of education: Not on file  . Highest education level: High school graduate  Occupational History  . Occupation: retired  Tobacco Use  . Smoking status: Former Smoker    Types: Cigarettes    Quit date: 09/08/1972    Years since quitting: 46.9  . Smokeless tobacco: Never Used  Substance and Sexual Activity  . Alcohol use: Not Currently    Alcohol/week: 0.0 standard drinks    Comment: Occasional glass of wine  . Drug use: No  . Sexual activity: Yes    Partners: Female    Birth  control/protection: None  Other Topics Concern  . Not on file  Social History Narrative  . Not on file   Social Determinants of Health   Financial Resource Strain:   . Difficulty of Paying Living Expenses:   Food Insecurity:   . Worried About Charity fundraiser in the Last Year:   . Arboriculturist in the Last Year:   Transportation Needs:   . Film/video editor (Medical):   Marland Kitchen Lack of Transportation (Non-Medical):   Physical Activity: Inactive  . Days of Exercise per Week: 0 days  . Minutes of Exercise per Session: 0 min  Stress:   . Feeling of Stress :   Social Connections:   . Frequency of Communication with Friends and Family:   . Frequency of Social Gatherings with Friends and Family:   . Attends Religious Services:   . Active Member of Clubs or Organizations:   . Attends Archivist Meetings:   Marland Kitchen Marital Status:   Intimate Partner Violence:   . Fear of Current or Ex-Partner:   . Emotionally Abused:   Marland Kitchen Physically Abused:   . Sexually Abused:      Family History:   Family History  Problem Relation Age of Onset  . Depression Sister   . Bipolar disorder Other   . Prostate cancer Neg Hx   . Kidney cancer Neg Hx   . Bladder Cancer Neg Hx     ROS:  Review of Systems  Constitutional: Positive for malaise/fatigue. Negative for chills, diaphoresis,  fever and weight loss.  HENT: Negative for congestion.   Eyes: Negative for discharge and redness.  Respiratory: Negative for cough, sputum production, shortness of breath and wheezing.   Cardiovascular: Negative for chest pain, palpitations, orthopnea, claudication, leg swelling and PND.  Gastrointestinal: Negative for abdominal pain, heartburn, nausea and vomiting.  Musculoskeletal: Negative for falls and myalgias.  Skin: Negative for rash.  Neurological: Positive for weakness. Negative for dizziness, tingling, tremors, sensory change, speech change, focal weakness, loss of consciousness and headaches.       Unsteady gait  Endo/Heme/Allergies: Does not bruise/bleed easily.  Psychiatric/Behavioral: Negative for substance abuse. The patient is not nervous/anxious.   All other systems reviewed and are negative.     Physical Exam/Data:   Vitals:   08/02/19 0700 08/02/19 0710 08/02/19 0722 08/02/19 0758  BP: (!) 133/58 (!) 124/52  (!) 109/94  Pulse: 74 (!) 29    Resp:  (!) 35  15  Temp:   99 F (37.2 C) 98 F (36.7 C)  TempSrc:   Axillary Oral  SpO2: 98% 97%  100%  Weight:    91.9 kg  Height:    '5\' 8"'  (1.727 m)    Intake/Output Summary (Last 24 hours) at 08/02/2019 0918 Last data filed at 08/02/2019 0421 Gross per 24 hour  Intake 550 ml  Output -  Net 550 ml   Filed Weights   08/01/19 1624 08/02/19 0758  Weight: 89.8 kg 91.9 kg   Body mass index is 30.81 kg/m.   Physical Exam: General: Well developed, well nourished, in no acute distress. Head: Normocephalic, atraumatic, sclera non-icteric, no xanthomas, nares without discharge.  Neck: Negative for carotid bruits. JVD not elevated. Lungs: Clear bilaterally to auscultation without wheezes, rales, or rhonchi. Breathing is unlabored. Heart: Bradycardic with S1 S2. No murmurs, rubs, or gallops appreciated. Abdomen: Soft, non-tender, non-distended with normoactive bowel sounds. No hepatomegaly. No rebound/guarding. No obvious  abdominal masses. Msk:  Strength and  tone appear normal for age. Extremities: No clubbing or cyanosis. No edema. Distal pedal pulses are 2+ and equal bilaterally. Neuro: Alert and oriented X 3. No facial asymmetry. No focal deficit. Moves all extremities spontaneously. Psych:  Responds to questions appropriately with a normal affect.   EKG:  The EKG was personally reviewed and demonstrates: A. fib with slow ventricular response, 39 bpm, nonspecific ST-T changes.  Repeat EKG appears to be consistent with junctional rhythm, 54 bpm, nonspecific ST-T changes Telemetry:  Telemetry was personally reviewed and demonstrates: Junctional versus A. fib with slow ventricular response, 30s bpm  Weights: Filed Weights   08/10/2019 1624 08/02/19 0758  Weight: 89.8 kg 91.9 kg    Relevant CV Studies:  2D echo pending  Laboratory Data:  Chemistry Recent Labs  Lab 08-10-19 1627 08/02/19 0249  NA 141 142  K 4.8 4.6  CL 121* 124*  CO2 11* 12*  GLUCOSE 116* 150*  BUN 123* 123*  CREATININE 7.32* 7.01*  CALCIUM 9.2 8.5*  GFRNONAA 7* 7*  GFRAA 8* 8*  ANIONGAP 9 6    No results for input(s): PROT, ALBUMIN, AST, ALT, ALKPHOS, BILITOT in the last 168 hours. Hematology Recent Labs  Lab 08-10-2019 1627  WBC 5.0  RBC 2.89*  HGB 10.3*  HCT 31.4*  MCV 108.7*  MCH 35.6*  MCHC 32.8  RDW 13.8  PLT 130*   Cardiac EnzymesNo results for input(s): TROPONINI in the last 168 hours. No results for input(s): TROPIPOC in the last 168 hours.  BNPNo results for input(s): BNP, PROBNP in the last 168 hours.  DDimer No results for input(s): DDIMER in the last 168 hours.  Radiology/Studies:  US Renal  Result Date: 2019/08/10 IMPRESSION: Bilateral renal cysts simple in nature. Increased echogenicity and cortical thinning consistent with medical renal disease. No obstructive changes are seen. Electronically Signed   By: Inez Catalina M.D.   On: 2019/08/10 19:33   DG Chest Portable 1 View  Result Date:  08-10-2019 IMPRESSION: Enlargement of cardiac silhouette. Low lung volumes without acute infiltrate. Electronically Signed   By: Lavonia Dana M.D.   On: 10-Aug-2019 19:04    Assessment and Plan:   1.  Junctional rhythm versus A. fib with slow ventricular response/prolonged QTc: -Likely in the setting of lithium toxicity, which appears to be unintentional, and some component of hypermagnesemia -Monitor on telemetry, if junctional rhythm persists despite improvement in lithium levels with HD he will require PPM -Suspect his symptomology is more from lithium toxicity/uremia given a MAP in the 80s with stable BP -No current indication for dopamine drip -Atropine as needed -If needed, during HD, could use dopamine -Hold lithium, Seroquel, trazodone, Imodium -Avoid AV nodal blocking agents -TSH normal -Echo pending  2.  Hypermagnesemia: -Possibly contributing to his underlying bradycardia -HD per nephrology given severe renal dysfunction  3.  Acute on CKD stage IV: -HD per nephrology  4.  HTN: -BP stable -Allow for permissive BP given underlying bradycardic rate  5.  Anemia of chronic disease: -Stable  6.  Bipolar disorder: -Will likely need psychiatry input for further recommendations regarding medications   For questions or updates, please contact [place] Please consult [email or web address] for contact info under Cardiology/STEMI.   Signed, Christell Faith, PA-C [place] Pager: [phone] 08/02/2019, 9:18 AM

## 2019-08-02 NOTE — Progress Notes (Signed)
Received report from Ander Purpura, [place].

## 2019-08-02 NOTE — ED Notes (Addendum)
NP Ouma notified of pts vitals. HR and BP, no orders at this time

## 2019-08-02 NOTE — Progress Notes (Addendum)
    BRIEF OVERNIGHT PROGRESS REPORT    SUBJECTIVE: Patient with severe bradycardia <30bpm, decreased urine output, hypotension without change in mental status.  OBJECTIVE: Patient assessed at the bedside, he was afebrile with blood pressure 89/46 mm Hg and pulse rate 25 beats/min. There were no focal neurological deficits; he was alert and nods appropriately to orientation questions.    BRIEF PATIENT DESCRIPTION: 74 year old male with PMH CKD stage IV, bipolar disorder, diabetes insipidus, HLD, IBS, secondary hyperparathyroidism of renal origin, and OSA on CPAP admitted on 08/01/2019 with symptomatic bradycardia, lithium toxicity and AKI.  ASSESSMENT/PLAN:  1. Complete AV Block - Patient presents with symptomatic bradycardia - likely case of  cardiorenal syndrome? - Repeat EKG shows complete AV block - Atropine as needed, may need dopamine infusion if no improvement with atropine or presence of hemodynamic instability - Monitor in stepdown unit - Echocardiogram pending - IVFs and PRN bolus to keep MAP<56mHg or SBP <971mg - Discussed EKG finding with on-call cardiologist  Dr. BeHaroldine Lawsho recommends close monitoring and treatment with atropine/dopamine and probable underlying cause.  Patient will also likely need pacemaker placement.  Appreciate cardiology input - Cardiology consult placed  2. AKI on CKD stage IV - BUN/creatinine elevated above baseline 123/7.32 - Repeat BMP+mag + lithium levels - Avoid Nephrotoxins - Will give calcium gluconate pending repeat labs - IVFs hydration - Renal ultrasound without evidence of obstruction - Nephrology consult placed  3. Bipolar disorder -on lithium now with lithium toxicity - Hold lithium - Monitor lithium levels  4. QTc prolongation - Avoid QTc prolongation medication - Pharmacy consult  5. OSA - On CPAP     ElRufina FalcoDNP, CCRN, FNBaylor Surgical Hospital At Las Colinasriad Hospitalist Nurse Practitioner Between 7pm to 7a[employer] Pager - [phone]

## 2019-08-02 NOTE — Progress Notes (Signed)
*  PRELIMINARY RESULTS* Echocardiogram 2D Echocardiogram has been performed.  Luis Miller 08/02/2019, 9:12 AM

## 2019-08-03 DIAGNOSIS — I959 Hypotension, unspecified: Secondary | ICD-10-CM

## 2019-08-03 LAB — LITHIUM LEVEL: Lithium Lvl: 1.07 mmol/L (ref 0.60–1.20)

## 2019-08-03 LAB — CBC
HCT: 26 % — ABNORMAL LOW (ref 39.0–52.0)
Hemoglobin: 8.6 g/dL — ABNORMAL LOW (ref 13.0–17.0)
MCH: 34.7 pg — ABNORMAL HIGH (ref 26.0–34.0)
MCHC: 33.1 g/dL (ref 30.0–36.0)
MCV: 104.8 fL — ABNORMAL HIGH (ref 80.0–100.0)
Platelets: 120 10*3/uL — ABNORMAL LOW (ref 150–400)
RBC: 2.48 MIL/uL — ABNORMAL LOW (ref 4.22–5.81)
RDW: 13.3 % (ref 11.5–15.5)
WBC: 4.9 10*3/uL (ref 4.0–10.5)
nRBC: 0 % (ref 0.0–0.2)

## 2019-08-03 LAB — RENAL FUNCTION PANEL
Albumin: 3.3 g/dL — ABNORMAL LOW (ref 3.5–5.0)
Anion gap: 5 (ref 5–15)
BUN: 84 mg/dL — ABNORMAL HIGH (ref 8–23)
CO2: 19 mmol/L — ABNORMAL LOW (ref 22–32)
Calcium: 8.4 mg/dL — ABNORMAL LOW (ref 8.9–10.3)
Chloride: 123 mmol/L — ABNORMAL HIGH (ref 98–111)
Creatinine, Ser: 4.95 mg/dL — ABNORMAL HIGH (ref 0.61–1.24)
GFR calc Af Amer: 12 mL/min — ABNORMAL LOW (ref >60)
GFR calc non Af Amer: 11 mL/min — ABNORMAL LOW (ref >60)
Glucose, Bld: 107 mg/dL — ABNORMAL HIGH (ref 70–99)
Phosphorus: 4 mg/dL (ref 2.5–4.6)
Potassium: 3.4 mmol/L — ABNORMAL LOW (ref 3.5–5.1)
Sodium: 147 mmol/L — ABNORMAL HIGH (ref 135–145)

## 2019-08-03 LAB — GLUCOSE, CAPILLARY
Glucose-Capillary: 103 mg/dL — ABNORMAL HIGH (ref 70–99)
Glucose-Capillary: 137 mg/dL — ABNORMAL HIGH (ref 70–99)

## 2019-08-03 MED ORDER — AMLODIPINE BESYLATE 10 MG PO TABS
10.0000 mg | ORAL_TABLET | Freq: Every day | ORAL | Status: DC
  Administered 2019-08-03 – 2019-08-06 (×3): 10 mg via ORAL
  Filled 2019-08-03 (×4): qty 1

## 2019-08-03 MED ORDER — QUETIAPINE FUMARATE 25 MG PO TABS
25.0000 mg | ORAL_TABLET | Freq: Every day | ORAL | Status: DC
  Administered 2019-08-03 – 2019-08-04 (×2): 25 mg via ORAL
  Filled 2019-08-03 (×2): qty 1

## 2019-08-03 MED ORDER — PNEUMOCOCCAL VAC POLYVALENT 25 MCG/0.5ML IJ INJ
0.5000 mL | INJECTION | INTRAMUSCULAR | Status: DC
  Filled 2019-08-03: qty 0.5

## 2019-08-03 MED ORDER — ALLOPURINOL 100 MG PO TABS
100.0000 mg | ORAL_TABLET | Freq: Every day | ORAL | Status: DC
  Administered 2019-08-03 – 2019-08-06 (×4): 100 mg via ORAL
  Filled 2019-08-03 (×5): qty 1

## 2019-08-03 MED ORDER — SODIUM CHLORIDE 0.9 % IV SOLN: INTRAVENOUS | Status: DC

## 2019-08-03 NOTE — Progress Notes (Signed)
[facility]  ROUNDING NOTE   Subjective:  Patient feeling better today. Lithium level is normalized. Good urine output of 2.1 L yesterday.   Objective:  Vital signs in last 24 hours:  Temp:  [97.7 F (36.5 C)-98.5 F (36.9 C)] 98.5 F (36.9 C) (05/14 0800) Pulse Rate:  [40-57] 43 (05/14 1400) Resp:  [10-26] 22 (05/14 1400) BP: (122-149)/(59-98) 124/59 (05/14 1400) SpO2:  [91 %-100 %] 91 % (05/14 1400) Weight:  [88.8 kg] 88.8 kg (05/14 0428)  Weight change: 2.088 kg Filed Weights   08/01/19 1624 08/02/19 0758 08/03/19 0428  Weight: 89.8 kg 91.9 kg 88.8 kg    Intake/Output: I/O last 3 completed shifts: In: 1030 [P.O.:480; IV Piggyback:550] Out: 2175 [Urine:2175]   Intake/Output this shift:  Total I/O In: 319.7 [P.O.:240; I.V.:79.7] Out: 1000 [Urine:1000]  Physical Exam: General: No acute distress  Head: Normocephalic, atraumatic. Moist oral mucosal membranes  Eyes: Anicteric  Neck: Supple, trachea midline  Lungs:  Clear to auscultation, normal effort  Heart: S1S2 no rubs  Abdomen:  Soft, nontender, bowel sounds present  Extremities: traceperipheral edema.  Neurologic: Awake, alert, following commands  Skin: No lesions  Access: Right femoral dialysis catheter    Basic Metabolic Panel: Recent Labs  Lab 08/01/19 1627 08/02/19 0249 08/03/19 0415  NA 141 142 147*  K 4.8 4.6 3.4*  CL 121* 124* 123*  CO2 11* 12* 19*  GLUCOSE 116* 150* 107*  BUN 123* 123* 84*  CREATININE 7.32* 7.01* 4.95*  CALCIUM 9.2 8.5* 8.4*  MG 2.9* 3.0*  --   PHOS  --   --  4.0    Liver Function Tests: Recent Labs  Lab 08/03/19 0415  ALBUMIN 3.3*   No results for input(s): LIPASE, AMYLASE in the last 168 hours. No results for input(s): AMMONIA in the last 168 hours.  CBC: Recent Labs  Lab 08/01/19 1627 08/03/19 0415  WBC 5.0 4.9  HGB 10.3* 8.6*  HCT 31.4* 26.0*  MCV 108.7* 104.8*  PLT 130* 120*    Cardiac Enzymes: No results for input(s): CKTOTAL,  CKMB, CKMBINDEX, TROPONINI in the last 168 hours.  BNP: Invalid input(s): POCBNP  CBG: Recent Labs  Lab 08/02/19 0759 08/02/19 1116 08/03/19 0729 08/03/19 1125  GLUCAP 84 172* 103* 137*    Microbiology: Results for orders placed or performed during the hospital encounter of 08/01/19  SARS Coronavirus 2 by RT PCR (hospital order, performed in [facility] hospital lab) Nasopharyngeal Nasopharyngeal Swab     Status: None   Collection Time: 08/01/19  6:24 PM   Specimen: Nasopharyngeal Swab  Result Value Ref Range Status   SARS Coronavirus 2 NEGATIVE NEGATIVE Final    Comment: (NOTE) SARS-CoV-2 target nucleic acids are NOT DETECTED. The SARS-CoV-2 RNA is generally detectable in upper and lower respiratory specimens during the acute phase of infection. The lowest concentration of SARS-CoV-2 viral copies this assay can detect is 250 copies / mL. A negative result does not preclude SARS-CoV-2 infection and should not be used as the sole basis for treatment or other patient management decisions.  A negative result may occur with improper specimen collection / handling, submission of specimen other than nasopharyngeal swab, presence of viral mutation(s) within the areas targeted by this assay, and inadequate number of viral copies (<250 copies / mL). A negative result must be combined with clinical observations, patient history, and epidemiological information. Fact Sheet for Patients:   StrictlyIdeas.no Fact Sheet for Healthcare Providers: BankingDealers.co.za This test is not yet approved or  cleared  by the [place] and has been authorized for detection and/or diagnosis of SARS-CoV-2 by FDA under an Emergency Use Authorization (EUA).  This EUA will remain in effect (meaning this test can be used) for the duration of the COVID-19 declaration under Section 564(b)(1) of the Act, 21 U.S.C. section 360bbb-3(b)(1), unless the  authorization is terminated or revoked sooner. Performed at [facility], [address] [place], [place] [address]   MRSA PCR Screening     Status: None   Collection Time: 08/02/19  8:03 AM   Specimen: Nasopharyngeal  Result Value Ref Range Status   MRSA by PCR NEGATIVE NEGATIVE Final    Comment:        The GeneXpert MRSA Assay (FDA approved for NASAL specimens only), is one component of a comprehensive MRSA colonization surveillance program. It is not intended to diagnose MRSA infection nor to guide or monitor treatment for MRSA infections. Performed at [facility], [address] [place], [place] [address]     Coagulation Studies: No results for input(s): LABPROT, INR in the last 72 hours.  Urinalysis: Recent Labs    08/01/19 1824  COLORURINE YELLOW*  LABSPEC 1.011  PHURINE 5.0  GLUCOSEU NEGATIVE  HGBUR NEGATIVE  BILIRUBINUR NEGATIVE  KETONESUR NEGATIVE  PROTEINUR 30*  NITRITE NEGATIVE  LEUKOCYTESUR NEGATIVE      Imaging: US Renal  Result Date: 08/01/2019 CLINICAL DATA:  Acute renal failure EXAM: RENAL / URINARY TRACT ULTRASOUND COMPLETE COMPARISON:  03/19/2008 FINDINGS: Right Kidney: Renal measurements: 9.5 x 5.3 x 5.3 cm. = volume: 139 mL. Diffuse increased echogenicity is noted. Cortical thinning is seen. 4.6 cm cyst is noted in the upper pole of the right kidney. Left Kidney: Renal measurements: 10.0 x 6.2 x 4.7 cm = volume: 151 mL. Increased echogenicity is noted with cortical thinning. Multiple cysts are seen the largest of which measures 1.8 cm. Bladder: Appears normal for degree of bladder distention. Other: None. IMPRESSION: Bilateral renal cysts simple in nature. Increased echogenicity and cortical thinning consistent with medical renal disease. No obstructive changes are seen. Electronically Signed   By: Inez Catalina M.D.   On: 08/01/2019 19:33   DG Chest Portable 1 View  Result Date: 08/01/2019 CLINICAL DATA:  Acute renal fat,  bradycardia, rectal bleeding, chronic kidney disease, Crohn's disease, hypertension EXAM: PORTABLE CHEST 1 VIEW COMPARISON:  Portable exam 1828 hours compared to 09/17/2010 FINDINGS: External pacing leads project over chest. Enlargement of cardiac silhouette. Mediastinal contours and pulmonary vascularity normal. Decreased lung volumes without definite pulmonary infiltrate, pleural effusion or pneumothorax. Multiple old BILATERAL rib fractures. IMPRESSION: Enlargement of cardiac silhouette. Low lung volumes without acute infiltrate. Electronically Signed   By: Lavonia Dana M.D.   On: 08/01/2019 19:04   ECHOCARDIOGRAM COMPLETE  Result Date: 08/02/2019    ECHOCARDIOGRAM REPORT   Patient Name:   Luis Miller. Date of Exam: 08/02/2019 Medical Rec #:  [id number]          Height:       68.0 in Accession #:    [id number]         Weight:       202.6 lb Date of Birth:  1945-06-16          BSA:          2.055 m Patient Age:    48 years           BP:           109/94 mmHg  Patient Gender: M                  HR:           37 bpm. Exam Location:  [facility] Procedure: 2D Echo, Color Doppler and Cardiac Doppler Indications:     I48.91 Atrial fibrillation  History:         Patient has no prior history of Echocardiogram examinations.                  Risk Factors:Hypertension and Dyslipidemia.  Sonographer:     Charmayne Sheer RDCS (AE) Referring Phys:  [id number] Cleaster Corin PATEL Diagnosing Phys: Ida Rogue MD  Sonographer Comments: No subcostal window. IMPRESSIONS  1. Left ventricular ejection fraction, by estimation, is 60 to 65%. The left ventricle has normal function. The left ventricle has no regional wall motion abnormalities. Left ventricular diastolic parameters are indeterminate.  2. Right ventricular systolic function is normal. The right ventricular size is normal. There is moderately elevated pulmonary artery systolic pressure. The estimated right ventricular systolic pressure is 00.7 mmHg.  3. Bradycardia noted, rate in the  40s FINDINGS  Left Ventricle: Left ventricular ejection fraction, by estimation, is 60 to 65%. The left ventricle has normal function. The left ventricle has no regional wall motion abnormalities. The left ventricular internal cavity size was normal in size. There is  no left ventricular hypertrophy. Left ventricular diastolic parameters are indeterminate. Right Ventricle: The right ventricular size is normal. No increase in right ventricular wall thickness. Right ventricular systolic function is normal. There is moderately elevated pulmonary artery systolic pressure. The tricuspid regurgitant velocity is 3.34 m/s, and with an assumed right atrial pressure of 10 mmHg, the estimated right ventricular systolic pressure is 12.1 mmHg. Left Atrium: Left atrial size was normal in size. Right Atrium: Right atrial size was normal in size. Pericardium: There is no evidence of pericardial effusion. Mitral Valve: The mitral valve is normal in structure. Normal mobility of the mitral valve leaflets. No evidence of mitral valve regurgitation. No evidence of mitral valve stenosis. MV peak gradient, 8.1 mmHg. The mean mitral valve gradient is 2.0 mmHg. Tricuspid Valve: The tricuspid valve is normal in structure. Tricuspid valve regurgitation is not demonstrated. No evidence of tricuspid stenosis. Aortic Valve: The aortic valve is normal in structure. Aortic valve regurgitation is not visualized. No aortic stenosis is present. Aortic valve mean gradient measures 11.0 mmHg. Aortic valve peak gradient measures 26.2 mmHg. Aortic valve area, by VTI measures 2.38 cm. Pulmonic Valve: The pulmonic valve was normal in structure. Pulmonic valve regurgitation is not visualized. No evidence of pulmonic stenosis. Aorta: The aortic root is normal in size and structure. Venous: The inferior vena cava is normal in size with greater than 50% respiratory variability, suggesting right atrial pressure of 3 mmHg. IAS/Shunts: No atrial level shunt  detected by color flow Doppler.  LEFT VENTRICLE PLAX 2D LVIDd:         4.95 cm  Diastology LVIDs:         3.19 cm  LV e' lateral:   21.60 cm/s LV PW:         1.14 cm  LV E/e' lateral: 6.1 LV IVS:        0.94 cm  LV e' medial:    11.70 cm/s LVOT diam:     2.10 cm  LV E/e' medial:  11.2 LV SV:         107 LV SV Index:   52 LVOT Area:  3.46 cm  RIGHT VENTRICLE RV Basal diam:  4.31 cm LEFT ATRIUM           Index       RIGHT ATRIUM           Index LA diam:      4.70 cm 2.29 cm/m  RA Area:     18.00 cm LA Vol (A2C): 45.8 ml 22.29 ml/m RA Volume:   48.80 ml  23.75 ml/m LA Vol (A4C): 70.5 ml 34.31 ml/m  AORTIC VALVE                    PULMONIC VALVE AV Area (Vmax):    2.12 cm     PV Vmax:       1.19 m/s AV Area (Vmean):   2.18 cm     PV Vmean:      74.100 cm/s AV Area (VTI):     2.38 cm     PV VTI:        0.245 m AV Vmax:           256.00 cm/s  PV Peak grad:  5.7 mmHg AV Vmean:          151.000 cm/s PV Mean grad:  3.0 mmHg AV VTI:            0.449 m AV Peak Grad:      26.2 mmHg AV Mean Grad:      11.0 mmHg LVOT Vmax:         157.00 cm/s LVOT Vmean:        94.900 cm/s LVOT VTI:          0.308 m LVOT/AV VTI ratio: 0.69  AORTA Ao Root diam: 3.50 cm MITRAL VALVE                TRICUSPID VALVE MV Area (PHT): 2.60 cm     TR Peak grad:   44.6 mmHg MV Peak grad:  8.1 mmHg     TR Vmax:        334.00 cm/s MV Mean grad:  2.0 mmHg MV Vmax:       1.42 m/s     SHUNTS MV Vmean:      53.9 cm/s    Systemic VTI:  0.31 m MV Decel Time: 292 msec     Systemic Diam: 2.10 cm MV E velocity: 131.33 cm/s MV A velocity: 49.80 cm/s MV E/A ratio:  2.64 Ida Rogue MD Electronically signed by Ida Rogue MD Signature Date/Time: 08/02/2019/2:51:01 PM    Final      Medications:   . sodium chloride    . sodium chloride    . sodium chloride 50 mL/hr at 08/03/19 1245   . allopurinol  100 mg Oral Daily  . amLODipine  10 mg Oral Daily  . Chlorhexidine Gluconate Cloth  6 each Topical Daily  . QUEtiapine  25 mg Oral QHS  . sodium  chloride flush  3 mL Intravenous Q12H   sodium chloride, sodium chloride, acetaminophen **OR** acetaminophen, alteplase, heparin, lidocaine (PF), lidocaine-prilocaine, loperamide, ondansetron **OR** ondansetron (ZOFRAN) IV, pentafluoroprop-tetrafluoroeth  Assessment/ Plan:  74 y.o. 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  Patient is on lithium dependent and in the past when taken off of it ended up with psychiatric admission for suicidal ideation.  1.  Acute kidney injury/chronic kidney disease stage V/proteinuria/chronic  lithium usage.  Patient very well-known to [place] as we follow him very closely as an outpatient.  We were making plans for peritoneal dialysis catheter placement.  He had a recent elevation in lithium level and lithium dose was recommended to be decreased to 150 mg.  Despite this he has high lithium level.  Recommend holding lithium for now but we will likely need to restart him on this after he is started on peritoneal dialysis as previously he had a psychiatric admission for suicidal ideation after being taken off.  No further dialysis recommended as lithium level normal now.  Consider removing temporary dialysis catheter tomorrow.  2.  Anemia of chronic kidney disease.  Hemoglobin 8.6.  We will likely start the patient on Epogen soon as an outpatient.  3.  Secondary hyperparathyroidism.  We will continue to monitor abdominal metabolism parameters as an outpatient.   LOS: 1 Brasen Bundren 5/14/20212:38 PM

## 2019-08-03 NOTE — Progress Notes (Signed)
Pt is sitting up in chair and in no distress at this time. Pt has had a good day. HR has still been bradycardic but sustains in the 40s to 50s. BP stable and pt is asymptomatic. BMs have lessened throughout the day compared to overnight after immodium given this morning. Pt has been classed to med-surg status.

## 2019-08-03 NOTE — Progress Notes (Signed)
Progress Note  Patient Name: Luis Miller. Date of Encounter: 08/03/2019  Primary Cardiologist: [facility]  Subjective   No complaints, feels somewhat better today, waiting for breakfast Hemodialysis yesterday Telemetry reviewed, normal sinus rhythm in the 40s Blood pressures 825 systolic up to 053Z  Review of records, heart rate 51 bpm May 17, 2019 Heart rate 51 bpm in March 22, 2019 Heart rate 49 bpm in February 02, 2019   Inpatient Medications    Scheduled Meds: . allopurinol  100 mg Oral Daily  . amLODipine  10 mg Oral Daily  . Chlorhexidine Gluconate Cloth  6 each Topical Daily  . QUEtiapine  25 mg Oral QHS  . sodium chloride flush  3 mL Intravenous Q12H   Continuous Infusions: . sodium chloride    . sodium chloride    . sodium chloride 50 mL/hr at 08/03/19 1109   PRN Meds: sodium chloride, sodium chloride, acetaminophen **OR** acetaminophen, alteplase, heparin, lidocaine (PF), lidocaine-prilocaine, loperamide, ondansetron **OR** ondansetron (ZOFRAN) IV, pentafluoroprop-tetrafluoroeth   Vital Signs    Vitals:   08/03/19 0800 08/03/19 0900 08/03/19 1000 08/03/19 1100  BP: 132/65  130/61 138/63  Pulse: (!) 42 (!) 57 (!) 44 (!) 48  Resp: (!) 22 17 15 10   Temp: 98.5 F (36.9 C)     TempSrc: Oral     SpO2: 93% 95% 93% 96%  Weight:      Height:        Intake/Output Summary (Last 24 hours) at 08/03/2019 1126 Last data filed at 08/03/2019 0939 Gross per 24 hour  Intake 600 ml  Output 2300 ml  Net -1700 ml   Last 3 Weights 08/03/2019 08/02/2019 08/01/2019  Weight (lbs) 195 lb 12.3 oz 202 lb 9.6 oz 198 lb  Weight (kg) 88.8 kg 91.9 kg 89.812 kg  Some encounter information is confidential and restricted. Go to Review Flowsheets activity to see all data.      Telemetry    Sinus bradycardia rate 40-50- Personally Reviewed  ECG     - Personally Reviewed  Physical Exam   GEN: No acute distress.   Neck: No JVD Cardiac: RRR, no murmurs, rubs,  or gallops.  Respiratory: Clear to auscultation bilaterally. GI: Soft, nontender, non-distended  MS: No edema; No deformity. Neuro:  Nonfocal  Psych: Normal affect   Labs    High Sensitivity Troponin:  No results for input(s): TROPONINIHS in the last 720 hours.    Chemistry Recent Labs  Lab 08/01/19 1627 08/02/19 0249 08/03/19 0415  NA 141 142 147*  K 4.8 4.6 3.4*  CL 121* 124* 123*  CO2 11* 12* 19*  GLUCOSE 116* 150* 107*  BUN 123* 123* 84*  CREATININE 7.32* 7.01* 4.95*  CALCIUM 9.2 8.5* 8.4*  ALBUMIN  --   --  3.3*  GFRNONAA 7* 7* 11*  GFRAA 8* 8* 12*  ANIONGAP 9 6 5      Hematology Recent Labs  Lab 08/01/19 1627 08/03/19 0415  WBC 5.0 4.9  RBC 2.89* 2.48*  HGB 10.3* 8.6*  HCT 31.4* 26.0*  MCV 108.7* 104.8*  MCH 35.6* 34.7*  MCHC 32.8 33.1  RDW 13.8 13.3  PLT 130* 120*    BNPNo results for input(s): BNP, PROBNP in the last 168 hours.   DDimer No results for input(s): DDIMER in the last 168 hours.   Radiology    US Renal  Result Date: 08/01/2019 CLINICAL DATA:  Acute renal failure EXAM: RENAL / URINARY TRACT ULTRASOUND COMPLETE COMPARISON:  03/19/2008 FINDINGS:  Right Kidney: Renal measurements: 9.5 x 5.3 x 5.3 cm. = volume: 139 mL. Diffuse increased echogenicity is noted. Cortical thinning is seen. 4.6 cm cyst is noted in the upper pole of the right kidney. Left Kidney: Renal measurements: 10.0 x 6.2 x 4.7 cm = volume: 151 mL. Increased echogenicity is noted with cortical thinning. Multiple cysts are seen the largest of which measures 1.8 cm. Bladder: Appears normal for degree of bladder distention. Other: None. IMPRESSION: Bilateral renal cysts simple in nature. Increased echogenicity and cortical thinning consistent with medical renal disease. No obstructive changes are seen. Electronically Signed   By: Inez Catalina M.D.   On: 08/01/2019 19:33   DG Chest Portable 1 View  Result Date: 08/01/2019 CLINICAL DATA:  Acute renal fat, bradycardia, rectal bleeding,  chronic kidney disease, Crohn's disease, hypertension EXAM: PORTABLE CHEST 1 VIEW COMPARISON:  Portable exam 1828 hours compared to 09/17/2010 FINDINGS: External pacing leads project over chest. Enlargement of cardiac silhouette. Mediastinal contours and pulmonary vascularity normal. Decreased lung volumes without definite pulmonary infiltrate, pleural effusion or pneumothorax. Multiple old BILATERAL rib fractures. IMPRESSION: Enlargement of cardiac silhouette. Low lung volumes without acute infiltrate. Electronically Signed   By: Lavonia Dana M.D.   On: 08/01/2019 19:04   ECHOCARDIOGRAM COMPLETE  Result Date: 08/02/2019    ECHOCARDIOGRAM REPORT   Patient Name:   Luis Miller. Date of Exam: 08/02/2019 Medical Rec #:  [id number]          Height:       68.0 in Accession #:    [id number]         Weight:       202.6 lb Date of Birth:  1946/01/02          BSA:          2.055 m Patient Age:    74 years           BP:           109/94 mmHg Patient Gender: M                  HR:           37 bpm. Exam Location:  [facility] Procedure: 2D Echo, Color Doppler and Cardiac Doppler Indications:     I48.91 Atrial fibrillation  History:         Patient has no prior history of Echocardiogram examinations.                  Risk Factors:Hypertension and Dyslipidemia.  Sonographer:     Charmayne Sheer RDCS (AE) Referring Phys:  [id number] Cleaster Corin PATEL Diagnosing Phys: Ida Rogue MD  Sonographer Comments: No subcostal window. IMPRESSIONS  1. Left ventricular ejection fraction, by estimation, is 60 to 65%. The left ventricle has normal function. The left ventricle has no regional wall motion abnormalities. Left ventricular diastolic parameters are indeterminate.  2. Right ventricular systolic function is normal. The right ventricular size is normal. There is moderately elevated pulmonary artery systolic pressure. The estimated right ventricular systolic pressure is 94.7 mmHg.  3. Bradycardia noted, rate in the 40s FINDINGS  Left Ventricle:  Left ventricular ejection fraction, by estimation, is 60 to 65%. The left ventricle has normal function. The left ventricle has no regional wall motion abnormalities. The left ventricular internal cavity size was normal in size. There is  no left ventricular hypertrophy. Left ventricular diastolic parameters are indeterminate. Right Ventricle: The right ventricular size is normal. No increase in right  ventricular wall thickness. Right ventricular systolic function is normal. There is moderately elevated pulmonary artery systolic pressure. The tricuspid regurgitant velocity is 3.34 m/s, and with an assumed right atrial pressure of 10 mmHg, the estimated right ventricular systolic pressure is 85.0 mmHg. Left Atrium: Left atrial size was normal in size. Right Atrium: Right atrial size was normal in size. Pericardium: There is no evidence of pericardial effusion. Mitral Valve: The mitral valve is normal in structure. Normal mobility of the mitral valve leaflets. No evidence of mitral valve regurgitation. No evidence of mitral valve stenosis. MV peak gradient, 8.1 mmHg. The mean mitral valve gradient is 2.0 mmHg. Tricuspid Valve: The tricuspid valve is normal in structure. Tricuspid valve regurgitation is not demonstrated. No evidence of tricuspid stenosis. Aortic Valve: The aortic valve is normal in structure. Aortic valve regurgitation is not visualized. No aortic stenosis is present. Aortic valve mean gradient measures 11.0 mmHg. Aortic valve peak gradient measures 26.2 mmHg. Aortic valve area, by VTI measures 2.38 cm. Pulmonic Valve: The pulmonic valve was normal in structure. Pulmonic valve regurgitation is not visualized. No evidence of pulmonic stenosis. Aorta: The aortic root is normal in size and structure. Venous: The inferior vena cava is normal in size with greater than 50% respiratory variability, suggesting right atrial pressure of 3 mmHg. IAS/Shunts: No atrial level shunt detected by color flow Doppler.   LEFT VENTRICLE PLAX 2D LVIDd:         4.95 cm  Diastology LVIDs:         3.19 cm  LV e' lateral:   21.60 cm/s LV PW:         1.14 cm  LV E/e' lateral: 6.1 LV IVS:        0.94 cm  LV e' medial:    11.70 cm/s LVOT diam:     2.10 cm  LV E/e' medial:  11.2 LV SV:         107 LV SV Index:   52 LVOT Area:     3.46 cm  RIGHT VENTRICLE RV Basal diam:  4.31 cm LEFT ATRIUM           Index       RIGHT ATRIUM           Index LA diam:      4.70 cm 2.29 cm/m  RA Area:     18.00 cm LA Vol (A2C): 45.8 ml 22.29 ml/m RA Volume:   48.80 ml  23.75 ml/m LA Vol (A4C): 70.5 ml 34.31 ml/m  AORTIC VALVE                    PULMONIC VALVE AV Area (Vmax):    2.12 cm     PV Vmax:       1.19 m/s AV Area (Vmean):   2.18 cm     PV Vmean:      74.100 cm/s AV Area (VTI):     2.38 cm     PV VTI:        0.245 m AV Vmax:           256.00 cm/s  PV Peak grad:  5.7 mmHg AV Vmean:          151.000 cm/s PV Mean grad:  3.0 mmHg AV VTI:            0.449 m AV Peak Grad:      26.2 mmHg AV Mean Grad:      11.0 mmHg LVOT Vmax:  157.00 cm/s LVOT Vmean:        94.900 cm/s LVOT VTI:          0.308 m LVOT/AV VTI ratio: 0.69  AORTA Ao Root diam: 3.50 cm MITRAL VALVE                TRICUSPID VALVE MV Area (PHT): 2.60 cm     TR Peak grad:   44.6 mmHg MV Peak grad:  8.1 mmHg     TR Vmax:        334.00 cm/s MV Mean grad:  2.0 mmHg MV Vmax:       1.42 m/s     SHUNTS MV Vmean:      53.9 cm/s    Systemic VTI:  0.31 m MV Decel Time: 292 msec     Systemic Diam: 2.10 cm MV E velocity: 131.33 cm/s MV A velocity: 49.80 cm/s MV E/A ratio:  2.64 Ida Rogue MD Electronically signed by Ida Rogue MD Signature Date/Time: 08/02/2019/2:51:01 PM    Final     Cardiac Studies     Patient Profile     Mr. Sida is a 74 year old gentleman with history of chronic kidney disease stage IV, anemia of chronic disease, bipolar disorder on lithium and hypertension Who presented to the hospital by referral from primary care for rate, rectal  bleeding,diarrhea  Assessment & Plan    Lithium toxicity In the setting of acute on chronic renal failure Hemodialysis yesterday, plan for further hemodialysis today Has history of bipolar, on lithium, trazodone, Seroquel Rhythm has improved yesterday from junctional escape rhythm to normal sinus/sinus bradycardia  Junctional bradycardia Most likely related to lithium toxicity With dialysis converted to normal sinus rhythm yesterday -Hemodynamically stable systolic pressures 450T up to 150s -No pacing needed Would hold any beta-blockers, AV nodal blockers -Review outpatient records indicates prior heart rates recorded in the high 40s to low 50s  Bipolar disorder On lithium trazodone Seroquel as outpatient Less confused today, suspect closer to his baseline  Acute on chronic renal failure Followed by nephrology as an outpatient Likely exacerbated by dehydration, diarrhea Fluids managed by hemodialysis   Total encounter time more than 35 minutes  Greater than 50% was spent in counseling and coordination of care with the patient   For questions or updates, please contact [facility] Please consult [email or web address] for contact info under        Signed, Ida Rogue, MD  08/03/2019, 11:26 AM

## 2019-08-03 NOTE — Progress Notes (Signed)
PROGRESS NOTE    [name]  LKT:[id number] DOB: 1945/07/31 DOA: 08/01/2019 PCP: Volney American, PA-C      Brief Narrative:  Mr. Brickle is a 74 y.o. 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  Patient had had 2 weeks generalized malaise, decreased UOP, "shakes", dizziness.  Went to [facility] office, found to have bradycardia, sent to ER.  In the ER, HR 49 but BP 126/65.  Cr 7.32, BUN 123, Bicab 11, lithium level 1.67.         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  Presented now with signs and symptoms of renal failure (bicarb 11, BUN 123, lithium toxicity).  Admitted and had HD, feels better today.  No further plans for HD today. -Strict I/Os -Continue gentle IV fluids -Consult Nephrology   Lithium toxicity Lithium level down to 1.0 after HD -Hold lithium, can resume as outpatient but not until after HD is established  Bradycardia Lithium can induce sinus node dysfunction, which can be permanent.   After dialysis, symptoms improved, lithium level normal, still bradycardic but stable symptoms and sinus function has returned. -Monitor on telemetry -Consult Cardiology  -Hold Lithium -HD per nephrology  Hypertension BP elevated now -Hold hydralazine, amiloride, benazepril -Resume amlodipine   Bipolar disorder -Hold Lithium -Resume Seroquel  Obesity BMI 31  Gout -Resume allopurinol       Disposition: Status is: Inpatient  Remains inpatient appropriate because: patient still has HR 40s and 50s, with severely decreased renal function acute on chronic, as well as may need further HD or pacer.      Dispo: The patient is from: Home              Anticipated d/c is to: Possibly home              Anticipated d/c date is: TBD, more than 2 days               Patient currently is not medically stable to d/c.    We will transfer out to floor.  Monitor for further HD needs, and eval by Cardiology for pacer.  If not needing pacer, and not needing further HD and Cr stabilizes, likely home in 2-3 days                MDM: The below labs and imaging reports reviewed and summarized above.  Medication management as above.    DVT prophylaxis: SCDs Code Status: FULL Family Communication:     Consultants:   Cardiology  Nephrology  Procedures:   5/13 vas cath placement  5/13 dialysis session #1  Antimicrobials:      Culture data:              Subjective: The patient's tremor and disorientation are resolve.d  Diarrhea overnight, improved with imodium.  No confusion, dizziness, syncope, hypotension.  No fever.  No chest pain, dyspnea.     Objective: Vitals:   08/03/19 0428 08/03/19 0700 08/03/19 0800 08/03/19 0900  BP:   132/65   Pulse:  (!) 48 (!) 42 (!) 57  Resp:  18 (!) 22 17  Temp:   98.5 F (36.9 C)   TempSrc:   Oral   SpO2:  97% 93% 95%  Weight: 88.8 kg     Height:  Intake/Output Summary (Last 24 hours) at 08/03/2019 0929 Last data filed at 08/03/2019 0800 Gross per 24 hour  Intake 480 ml  Output 2675 ml  Net -2195 ml   Filed Weights   08/01/19 1624 08/02/19 0758 08/03/19 0428  Weight: 89.8 kg 91.9 kg 88.8 kg    Examination: General appearance: obese adult male, lying in bed, NAD     HEENT: Anicteric, conjunctiva pink, lids and lashes normal, OP moist, no oral lesions, hearing normal   Skin: Normal, no rashes, warm and dry. Cardiac: Slow, normal rhythm, no murmurs, normal JVP, 1+ LE edema  Respiratory: Regular rate and rhythm, no rales or wheezds Abdomen: No TTP or guarding, no HSM  MSK: Normal muscle bulk and tone Neuro: A&O, EOMI, moves all extremities with moderate generalized weakness, no tremor.     Psych: Attention normal, afect normal, judgment and insigth normal.        Data Reviewed: I have personally reviewed following labs and imaging studies:  CBC: Recent Labs  Lab 08/01/19 1627 08/03/19 0415  WBC 5.0 4.9  HGB 10.3* 8.6*  HCT 31.4* 26.0*  MCV 108.7* 104.8*  PLT 130* 623*   Basic Metabolic Panel: Recent Labs  Lab 08/01/19 1627 08/02/19 0249 08/03/19 0415  NA 141 142 147*  K 4.8 4.6 3.4*  CL 121* 124* 123*  CO2 11* 12* 19*  GLUCOSE 116* 150* 107*  BUN 123* 123* 84*  CREATININE 7.32* 7.01* 4.95*  CALCIUM 9.2 8.5* 8.4*  MG 2.9* 3.0*  --   PHOS  --   --  4.0   GFR: Estimated Creatinine Clearance: 14.2 mL/min (A) (by C-G formula based on SCr of 4.95 mg/dL (H)). Liver Function Tests: Recent Labs  Lab 08/03/19 0415  ALBUMIN 3.3*   No results for input(s): LIPASE, AMYLASE in the last 168 hours. No results for input(s): AMMONIA in the last 168 hours. Coagulation Profile: No results for input(s): INR, PROTIME in the last 168 hours. Cardiac Enzymes: No results for input(s): CKTOTAL, CKMB, CKMBINDEX, TROPONINI in the last 168 hours. BNP (last 3 results) No results for input(s): PROBNP in the last 8760 hours. HbA1C: No results for input(s): HGBA1C in the last 72 hours. CBG: Recent Labs  Lab 08/02/19 0759 08/02/19 1116 08/03/19 0729  GLUCAP 84 172* 103*   Lipid Profile: No results for input(s): CHOL, HDL, LDLCALC, TRIG, CHOLHDL, LDLDIRECT in the last 72 hours. Thyroid Function Tests: Recent Labs    08/01/19 1627  TSH 1.226   Anemia Panel: No results for input(s): VITAMINB12, FOLATE, FERRITIN, TIBC, IRON, RETICCTPCT in the last 72 hours. Urine analysis:    Component Value Date/Time   COLORURINE YELLOW (A) 08/01/2019 1824   APPEARANCEUR HAZY (A) 08/01/2019 1824   APPEARANCEUR Clear 04/24/2018 0835   LABSPEC 1.011 08/01/2019 1824   PHURINE 5.0 08/01/2019 1824   GLUCOSEU NEGATIVE 08/01/2019 1824   HGBUR NEGATIVE 08/01/2019 1824   BILIRUBINUR NEGATIVE 08/01/2019 1824   BILIRUBINUR Negative 04/24/2018 [place] 08/01/2019 1824   PROTEINUR 30 (A) 08/01/2019 1824   NITRITE NEGATIVE 08/01/2019 1824   LEUKOCYTESUR NEGATIVE 08/01/2019 1824   Sepsis Labs: @LABRCNTIP (procalcitonin:4,lacticacidven:4)  ) Recent Results (from the past 240 hour(s))  SARS Coronavirus 2 by RT PCR (hospital order, performed in [place] hospital lab) Nasopharyngeal Nasopharyngeal Swab     Status: None   Collection Time: 08/01/19  6:24 PM   Specimen: Nasopharyngeal Swab  Result Value Ref Range Status   SARS Coronavirus 2 NEGATIVE NEGATIVE Final  Comment: (NOTE) SARS-CoV-2 target nucleic acids are NOT DETECTED. The SARS-CoV-2 RNA is generally detectable in upper and lower respiratory specimens during the acute phase of infection. The lowest concentration of SARS-CoV-2 viral copies this assay can detect is 250 copies / mL. A negative result does not preclude SARS-CoV-2 infection and should not be used as the sole basis for treatment or other patient management decisions.  A negative result may occur with improper specimen collection / handling, submission of specimen other than nasopharyngeal swab, presence of viral mutation(s) within the areas targeted by this assay, and inadequate number of viral copies (<250 copies / mL). A negative result must be combined with clinical observations, patient history, and epidemiological information. Fact Sheet for Patients:   StrictlyIdeas.no Fact Sheet for Healthcare Providers: BankingDealers.co.za This test is not yet approved or cleared  by the [place] FDA and has been authorized for detection and/or diagnosis of SARS-CoV-2 by FDA under an Emergency Use Authorization (EUA).  This EUA will remain in effect (meaning this test can be used) for the duration of the COVID-19 declaration under Section 564(b)(1) of the Act, 21 U.S.C. section 360bbb-3(b)(1), unless the authorization is terminated or revoked  sooner. Performed at [facility], [address] [place], [place] [address]   MRSA PCR Screening     Status: None   Collection Time: 08/02/19  8:03 AM   Specimen: Nasopharyngeal  Result Value Ref Range Status   MRSA by PCR NEGATIVE NEGATIVE Final    Comment:        The GeneXpert MRSA Assay (FDA approved for NASAL specimens only), is one component of a comprehensive MRSA colonization surveillance program. It is not intended to diagnose MRSA infection nor to guide or monitor treatment for MRSA infections. Performed at [facility], [address], [place], [place] [address]          Radiology Studies: US Renal  Result Date: 08/01/2019 CLINICAL DATA:  Acute renal failure EXAM: RENAL / URINARY TRACT ULTRASOUND COMPLETE COMPARISON:  03/19/2008 FINDINGS: Right Kidney: Renal measurements: 9.5 x 5.3 x 5.3 cm. = volume: 139 mL. Diffuse increased echogenicity is noted. Cortical thinning is seen. 4.6 cm cyst is noted in the upper pole of the right kidney. Left Kidney: Renal measurements: 10.0 x 6.2 x 4.7 cm = volume: 151 mL. Increased echogenicity is noted with cortical thinning. Multiple cysts are seen the largest of which measures 1.8 cm. Bladder: Appears normal for degree of bladder distention. Other: None. IMPRESSION: Bilateral renal cysts simple in nature. Increased echogenicity and cortical thinning consistent with medical renal disease. No obstructive changes are seen. Electronically Signed   By: Inez Catalina M.D.   On: 08/01/2019 19:33   DG Chest Portable 1 View  Result Date: 08/01/2019 CLINICAL DATA:  Acute renal fat, bradycardia, rectal bleeding, chronic kidney disease, Crohn's disease, hypertension EXAM: PORTABLE CHEST 1 VIEW COMPARISON:  Portable exam 1828 hours compared to 09/17/2010 FINDINGS: External pacing leads project over chest. Enlargement of cardiac silhouette. Mediastinal contours and pulmonary vascularity normal. Decreased lung volumes without  definite pulmonary infiltrate, pleural effusion or pneumothorax. Multiple old BILATERAL rib fractures. IMPRESSION: Enlargement of cardiac silhouette. Low lung volumes without acute infiltrate. Electronically Signed   By: Lavonia Dana M.D.   On: 08/01/2019 19:04   ECHOCARDIOGRAM COMPLETE  Result Date: 08/02/2019    ECHOCARDIOGRAM REPORT   Patient Name:   Luis Miller. Date of Exam: 08/02/2019 Medical Rec #:  [id number]  Height:       68.0 in Accession #:    [id number]         Weight:       202.6 lb Date of Birth:  [birth year]          BSA:          2.055 m Patient Age:    42 years           BP:           109/94 mmHg Patient Gender: M                  HR:           37 bpm. Exam Location:  [facility] Procedure: 2D Echo, Color Doppler and Cardiac Doppler Indications:     I48.91 Atrial fibrillation  History:         Patient has no prior history of Echocardiogram examinations.                  Risk Factors:Hypertension and Dyslipidemia.  Sonographer:     Charmayne Sheer RDCS (AE) Referring Phys:  [id number] Cleaster Corin PATEL Diagnosing Phys: Ida Rogue MD  Sonographer Comments: No subcostal window. IMPRESSIONS  1. Left ventricular ejection fraction, by estimation, is 60 to 65%. The left ventricle has normal function. The left ventricle has no regional wall motion abnormalities. Left ventricular diastolic parameters are indeterminate.  2. Right ventricular systolic function is normal. The right ventricular size is normal. There is moderately elevated pulmonary artery systolic pressure. The estimated right ventricular systolic pressure is 47.8 mmHg.  3. Bradycardia noted, rate in the 40s FINDINGS  Left Ventricle: Left ventricular ejection fraction, by estimation, is 60 to 65%. The left ventricle has normal function. The left ventricle has no regional wall motion abnormalities. The left ventricular internal cavity size was normal in size. There is  no left ventricular hypertrophy. Left ventricular diastolic parameters are  indeterminate. Right Ventricle: The right ventricular size is normal. No increase in right ventricular wall thickness. Right ventricular systolic function is normal. There is moderately elevated pulmonary artery systolic pressure. The tricuspid regurgitant velocity is 3.34 m/s, and with an assumed right atrial pressure of 10 mmHg, the estimated right ventricular systolic pressure is 29.5 mmHg. Left Atrium: Left atrial size was normal in size. Right Atrium: Right atrial size was normal in size. Pericardium: There is no evidence of pericardial effusion. Mitral Valve: The mitral valve is normal in structure. Normal mobility of the mitral valve leaflets. No evidence of mitral valve regurgitation. No evidence of mitral valve stenosis. MV peak gradient, 8.1 mmHg. The mean mitral valve gradient is 2.0 mmHg. Tricuspid Valve: The tricuspid valve is normal in structure. Tricuspid valve regurgitation is not demonstrated. No evidence of tricuspid stenosis. Aortic Valve: The aortic valve is normal in structure. Aortic valve regurgitation is not visualized. No aortic stenosis is present. Aortic valve mean gradient measures 11.0 mmHg. Aortic valve peak gradient measures 26.2 mmHg. Aortic valve area, by VTI measures 2.38 cm. Pulmonic Valve: The pulmonic valve was normal in structure. Pulmonic valve regurgitation is not visualized. No evidence of pulmonic stenosis. Aorta: The aortic root is normal in size and structure. Venous: The inferior vena cava is normal in size with greater than 50% respiratory variability, suggesting right atrial pressure of 3 mmHg. IAS/Shunts: No atrial level shunt detected by color flow Doppler.  LEFT VENTRICLE PLAX 2D LVIDd:         4.95 cm  Diastology LVIDs:  3.19 cm  LV e' lateral:   21.60 cm/s LV PW:         1.14 cm  LV E/e' lateral: 6.1 LV IVS:        0.94 cm  LV e' medial:    11.70 cm/s LVOT diam:     2.10 cm  LV E/e' medial:  11.2 LV SV:         107 LV SV Index:   52 LVOT Area:     3.46 cm   RIGHT VENTRICLE RV Basal diam:  4.31 cm LEFT ATRIUM           Index       RIGHT ATRIUM           Index LA diam:      4.70 cm 2.29 cm/m  RA Area:     18.00 cm LA Vol (A2C): 45.8 ml 22.29 ml/m RA Volume:   48.80 ml  23.75 ml/m LA Vol (A4C): 70.5 ml 34.31 ml/m  AORTIC VALVE                    PULMONIC VALVE AV Area (Vmax):    2.12 cm     PV Vmax:       1.19 m/s AV Area (Vmean):   2.18 cm     PV Vmean:      74.100 cm/s AV Area (VTI):     2.38 cm     PV VTI:        0.245 m AV Vmax:           256.00 cm/s  PV Peak grad:  5.7 mmHg AV Vmean:          151.000 cm/s PV Mean grad:  3.0 mmHg AV VTI:            0.449 m AV Peak Grad:      26.2 mmHg AV Mean Grad:      11.0 mmHg LVOT Vmax:         157.00 cm/s LVOT Vmean:        94.900 cm/s LVOT VTI:          0.308 m LVOT/AV VTI ratio: 0.69  AORTA Ao Root diam: 3.50 cm MITRAL VALVE                TRICUSPID VALVE MV Area (PHT): 2.60 cm     TR Peak grad:   44.6 mmHg MV Peak grad:  8.1 mmHg     TR Vmax:        334.00 cm/s MV Mean grad:  2.0 mmHg MV Vmax:       1.42 m/s     SHUNTS MV Vmean:      53.9 cm/s    Systemic VTI:  0.31 m MV Decel Time: 292 msec     Systemic Diam: 2.10 cm MV E velocity: 131.33 cm/s MV A velocity: 49.80 cm/s MV E/A ratio:  2.64 Ida Rogue MD Electronically signed by Ida Rogue MD Signature Date/Time: 08/02/2019/2:51:01 PM    Final         Scheduled Meds: . allopurinol  100 mg Oral Daily  . amLODipine  10 mg Oral Daily  . Chlorhexidine Gluconate Cloth  6 each Topical Daily  . QUEtiapine  25 mg Oral QHS  . sodium chloride flush  3 mL Intravenous Q12H   Continuous Infusions: . sodium chloride    . sodium chloride    . sodium chloride  LOS: 1 day    Time spent: 25 minutes    Edwin Dada, MD [facility] 08/03/2019, 9:29 AM     Please page though [place] or Epic secure chat:  For [employer], Adult nurse

## 2019-08-04 DIAGNOSIS — R001 Bradycardia, unspecified: Secondary | ICD-10-CM

## 2019-08-04 LAB — RENAL FUNCTION PANEL
Albumin: 3.4 g/dL — ABNORMAL LOW (ref 3.5–5.0)
Anion gap: 5 (ref 5–15)
BUN: 68 mg/dL — ABNORMAL HIGH (ref 8–23)
CO2: 19 mmol/L — ABNORMAL LOW (ref 22–32)
Calcium: 8.8 mg/dL — ABNORMAL LOW (ref 8.9–10.3)
Chloride: 127 mmol/L — ABNORMAL HIGH (ref 98–111)
Creatinine, Ser: 4.36 mg/dL — ABNORMAL HIGH (ref 0.61–1.24)
GFR calc Af Amer: 14 mL/min — ABNORMAL LOW (ref >60)
GFR calc non Af Amer: 12 mL/min — ABNORMAL LOW (ref >60)
Glucose, Bld: 97 mg/dL (ref 70–99)
Phosphorus: 3.9 mg/dL (ref 2.5–4.6)
Potassium: 3.8 mmol/L (ref 3.5–5.1)
Sodium: 151 mmol/L — ABNORMAL HIGH (ref 135–145)

## 2019-08-04 LAB — CBC
HCT: 30.2 % — ABNORMAL LOW (ref 39.0–52.0)
Hemoglobin: 10.3 g/dL — ABNORMAL LOW (ref 13.0–17.0)
MCH: 34.7 pg — ABNORMAL HIGH (ref 26.0–34.0)
MCHC: 34.1 g/dL (ref 30.0–36.0)
MCV: 101.7 fL — ABNORMAL HIGH (ref 80.0–100.0)
Platelets: 131 10*3/uL — ABNORMAL LOW (ref 150–400)
RBC: 2.97 MIL/uL — ABNORMAL LOW (ref 4.22–5.81)
RDW: 13.5 % (ref 11.5–15.5)
WBC: 5.4 10*3/uL (ref 4.0–10.5)
nRBC: 0 % (ref 0.0–0.2)

## 2019-08-04 MED ORDER — HYDRALAZINE HCL 50 MG PO TABS
50.0000 mg | ORAL_TABLET | Freq: Three times a day (TID) | ORAL | Status: DC
  Administered 2019-08-04 – 2019-08-06 (×4): 50 mg via ORAL
  Filled 2019-08-04 (×5): qty 1

## 2019-08-04 MED ORDER — DEXTROSE 5 % IV SOLN: INTRAVENOUS | Status: DC

## 2019-08-04 NOTE — Progress Notes (Signed)
Patient HR dropped to 28 per CCMD. NP notified.

## 2019-08-04 NOTE — Progress Notes (Signed)
Progress Note  Patient Name: Luis Miller. Date of Encounter: 08/04/2019  Primary Cardiologist: [facility]  Subjective   No complaints, would like to go home Still has hemodialysis catheter right groin Has not spent much time out of bed Continues on low-dose Seroquel, lithium held  Review of records, heart rate 51 bpm May 17, 2019 Heart rate 51 bpm in March 22, 2019 Heart rate 49 bpm in February 02, 2019   Inpatient Medications    Scheduled Meds: . allopurinol  100 mg Oral Daily  . amLODipine  10 mg Oral Daily  . Chlorhexidine Gluconate Cloth  6 each Topical Daily  . hydrALAZINE  50 mg Oral Q8H  . pneumococcal 23 valent vaccine  0.5 mL Intramuscular Tomorrow-1000  . QUEtiapine  25 mg Oral QHS  . sodium chloride flush  3 mL Intravenous Q12H   Continuous Infusions: . sodium chloride    . sodium chloride    . dextrose 125 mL/hr at 08/04/19 1126   PRN Meds: sodium chloride, sodium chloride, acetaminophen **OR** acetaminophen, alteplase, heparin, lidocaine (PF), lidocaine-prilocaine, loperamide, ondansetron **OR** ondansetron (ZOFRAN) IV, pentafluoroprop-tetrafluoroeth   Vital Signs    Vitals:   08/04/19 0148 08/04/19 0354 08/04/19 0743 08/04/19 1104  BP: (!) 156/67 (!) 151/75 (!) 157/74 (!) 156/75  Pulse: (!) 54 (!) 46 (!) 43 (!) 50  Resp: 18  17 17   Temp: 98.8 F (37.1 C) (!) 97.5 F (36.4 C) 97.6 F (36.4 C) 97.8 F (36.6 C)  TempSrc: Oral Oral Oral   SpO2: 99% 98% 100% 99%  Weight: 88 kg     Height: 5' 8"  (1.727 m)       Intake/Output Summary (Last 24 hours) at 08/04/2019 1538 Last data filed at 08/04/2019 1315 Gross per 24 hour  Intake 503.14 ml  Output 2700 ml  Net -2196.86 ml   Last 3 Weights 08/04/2019 08/03/2019 08/02/2019  Weight (lbs) 194 lb 195 lb 12.3 oz 202 lb 9.6 oz  Weight (kg) 87.998 kg 88.8 kg 91.9 kg  Some encounter information is confidential and restricted. Go to Review Flowsheets activity to see all data.      Telemetry     Sinus bradycardia rate 40-50- Personally Reviewed  ECG     - Personally Reviewed  Physical Exam   Constitutional:  oriented to person, place, and time. No distress.  HENT:  Head: Grossly normal Eyes:  no discharge. No scleral icterus.  Neck: No JVD, no carotid bruits  Cardiovascular: Regular rate and rhythm, no murmurs appreciated Pulmonary/Chest: Clear to auscultation bilaterally, no wheezes or rails Abdominal: Soft.  no distension.  no tenderness.  Musculoskeletal: Normal range of motion Neurological:  normal muscle tone. Coordination normal. No atrophy Skin: Skin warm and dry Psychiatric: normal affect, pleasant   Labs    High Sensitivity Troponin:  No results for input(s): TROPONINIHS in the last 720 hours.    Chemistry Recent Labs  Lab 08/02/19 0249 08/03/19 0415 08/04/19 0436  NA 142 147* 151*  K 4.6 3.4* 3.8  CL 124* 123* 127*  CO2 12* 19* 19*  GLUCOSE 150* 107* 97  BUN 123* 84* 68*  CREATININE 7.01* 4.95* 4.36*  CALCIUM 8.5* 8.4* 8.8*  ALBUMIN  --  3.3* 3.4*  GFRNONAA 7* 11* 12*  GFRAA 8* 12* 14*  ANIONGAP 6 5 5      Hematology Recent Labs  Lab 08/01/19 1627 08/03/19 0415 08/04/19 0722  WBC 5.0 4.9 5.4  RBC 2.89* 2.48* 2.97*  HGB 10.3* 8.6* 10.3*  HCT 31.4* 26.0* 30.2*  MCV 108.7* 104.8* 101.7*  MCH 35.6* 34.7* 34.7*  MCHC 32.8 33.1 34.1  RDW 13.8 13.3 13.5  PLT 130* 120* 131*    BNPNo results for input(s): BNP, PROBNP in the last 168 hours.   DDimer No results for input(s): DDIMER in the last 168 hours.   Radiology    No results found.  Cardiac Studies     Patient Profile     Luis Miller is a 74 year old gentleman with history of chronic kidney disease stage IV, anemia of chronic disease, bipolar disorder on lithium and hypertension Who presented to the hospital by referral from primary care for rate, rectal bleeding,diarrhea  Assessment & Plan    Lithium toxicity In the setting of acute on chronic renal  failure Hemodialysis  x1 this admission with improvement in his rhythm from junctional rhythm to sinus bradycardia  Has history of bipolar, on lithium, trazodone, Seroquel as outpatient Lithium held  Junctional bradycardia Most likely related to lithium toxicity though Unable to exclude contribution from Seroquel Symptoms improved/rhythm improved with dialysis -Asymptomatic bradycardia rate in the 40-50 range, higher with exertion Blood pressure stable, no indication for pacing or pacemaker placement --Seroquel does have some alpha-2 affinity which has the potential to induce bradycardia.  Would recommend if bradycardia persists, suggest change of his Seroquel to alternate agent if possible -Plan is to discharge off lithium which can cause sinus node disease  Bipolar disorder On lithium trazodone Seroquel as outpatient Appears to be close to his baseline Lithium held For persistent bradycardia may need to find alternate agents other than Seroquel if possible  Acute on chronic renal failure Followed by nephrology as an outpatient Likely exacerbated by dehydration, diarrhea Plan is for peritoneal dialysis as outpatient, making good urine currently  Case discussed with nephrology, also discussed with hospitalist service  Total encounter time more than 35 minutes  Greater than 50% was spent in counseling and coordination of care with the patient   For questions or updates, please contact [facility] Please consult [email or web address] for contact info under        Signed, Ida Rogue, MD  08/04/2019, 3:38 PM

## 2019-08-04 NOTE — Progress Notes (Signed)
[facility]  ROUNDING NOTE   Subjective:   Patient has no complaints this morning.  UOP 2752m  Na 151 - free water deficit of 3.4 liters   Objective:  Vital signs in last 24 hours:  Temp:  [97.5 F (36.4 C)-98.8 F (37.1 C)] 97.6 F (36.4 C) (05/15 0743) Pulse Rate:  [38-54] 43 (05/15 0743) Resp:  [10-22] 17 (05/15 0743) BP: (124-160)/(59-102) 157/74 (05/15 0743) SpO2:  [90 %-100 %] 100 % (05/15 0743) Weight:  [88 kg] 88 kg (05/15 0148)  Weight change: -3.902 kg Filed Weights   08/02/19 0758 08/03/19 0428 08/04/19 0148  Weight: 91.9 kg 88.8 kg 88 kg    Intake/Output: I/O last 3 completed shifts: In: 1062.9 [P.O.:480; I.V.:582.9] Out: 4500 [Urine:4500]   Intake/Output this shift:  No intake/output data recorded.  Physical Exam: General: No acute distress  Head: Normocephalic, atraumatic. Moist oral mucosal membranes  Eyes: Anicteric  Neck: Supple, trachea midline  Lungs:  Clear to auscultation, normal effort  Heart: bradycardia  Abdomen:  Soft, nontender, bowel sounds present  Extremities: No edema.  Neurologic: Awake, alert, following commands  Skin: No lesions  Access: Right femoral dialysis catheter    Basic Metabolic Panel: Recent Labs  Lab 08/01/19 1627 08/01/19 1627 08/02/19 0249 08/03/19 0415 08/04/19 0436  NA 141  --  142 147* 151*  K 4.8  --  4.6 3.4* 3.8  CL 121*  --  124* 123* 127*  CO2 11*  --  12* 19* 19*  GLUCOSE 116*  --  150* 107* 97  BUN 123*  --  123* 84* 68*  CREATININE 7.32*  --  7.01* 4.95* 4.36*  CALCIUM 9.2   < > 8.5* 8.4* 8.8*  MG 2.9*  --  3.0*  --   --   PHOS  --   --   --  4.0 3.9   < > = values in this interval not displayed.    Liver Function Tests: Recent Labs  Lab 08/03/19 0415 08/04/19 0436  ALBUMIN 3.3* 3.4*   No results for input(s): LIPASE, AMYLASE in the last 168 hours. No results for input(s): AMMONIA in the last 168 hours.  CBC: Recent Labs  Lab 08/01/19 1627 08/03/19 0415  08/04/19 0722  WBC 5.0 4.9 5.4  HGB 10.3* 8.6* 10.3*  HCT 31.4* 26.0* 30.2*  MCV 108.7* 104.8* 101.7*  PLT 130* 120* 131*    Cardiac Enzymes: No results for input(s): CKTOTAL, CKMB, CKMBINDEX, TROPONINI in the last 168 hours.  BNP: Invalid input(s): POCBNP  CBG: Recent Labs  Lab 08/02/19 0759 08/02/19 1116 08/03/19 0729 08/03/19 1125  GLUCAP 84 172* 103* 137*    Microbiology: Results for orders placed or performed during the hospital encounter of 08/01/19  SARS Coronavirus 2 by RT PCR (hospital order[facility]) Nasopharyngeal Nasopharyngeal Swab     Status: None   Collection Time: 08/01/19  6:24 PM   Specimen: Nasopharyngeal Swab  Result Value Ref Range Status   SARS Coronavirus 2 NEGATIVE NEGATIVE Final    Comment: (NOTE) SARS-CoV-2 target nucleic acids are NOT DETECTED. The SARS-CoV-2 RNA is generally detectable in upper and lower respiratory specimens during the acute phase of infection. The lowest concentration of SARS-CoV-2 viral copies this assay can detect is 250 copies / mL. A negative result does not preclude SARS-CoV-2 infection and should not be used as the sole basis for treatment or other patient management decisions.  A negative result may occur with improper specimen  collection / handling, submission of specimen other than nasopharyngeal swab, presence of viral mutation(s) within the areas targeted by this assay, and inadequate number of viral copies (<250 copies / mL). A negative result must be combined with clinical observations, patient history, and epidemiological information. Fact Sheet for Patients:   StrictlyIdeas.no Fact Sheet for Healthcare Providers: BankingDealers.co.za This test is not yet approved or cleared  by the [place] FDA and has been authorized for detection and/or diagnosis of SARS-CoV-2 by FDA under an Emergency Use Authorization (EUA).  This EUA  will remain in effect (meaning this test can be used) for the duration of the COVID-19 declaration under Section 564(b)(1) of the Act, 21 U.S.C. section 360bbb-3(b)(1), unless the authorization is terminated or revoked sooner. Performed at [facility], [address] [place], [place] [address]   MRSA PCR Screening     Status: None   Collection Time: 08/02/19  8:03 AM   Specimen: Nasopharyngeal  Result Value Ref Range Status   MRSA by PCR NEGATIVE NEGATIVE Final    Comment:        The GeneXpert MRSA Assay (FDA approved for NASAL specimens only), is one component of a comprehensive MRSA colonization surveillance program. It is not intended to diagnose MRSA infection nor to guide or monitor treatment for MRSA infections. Performed at [facility], [address] [place], [place] [address]     Coagulation Studies: No results for input(s): LABPROT, INR in the last 72 hours.  Urinalysis: Recent Labs    08/01/19 1824  COLORURINE YELLOW*  LABSPEC 1.011  PHURINE 5.0  GLUCOSEU NEGATIVE  HGBUR NEGATIVE  BILIRUBINUR NEGATIVE  KETONESUR NEGATIVE  PROTEINUR 30*  NITRITE NEGATIVE  LEUKOCYTESUR NEGATIVE      Imaging: No results found.   Medications:   . sodium chloride    . sodium chloride    . dextrose 75 mL/hr at 08/04/19 0844   . allopurinol  100 mg Oral Daily  . amLODipine  10 mg Oral Daily  . Chlorhexidine Gluconate Cloth  6 each Topical Daily  . pneumococcal 23 valent vaccine  0.5 mL Intramuscular Tomorrow-1000  . QUEtiapine  25 mg Oral QHS  . sodium chloride flush  3 mL Intravenous Q12H   sodium chloride, sodium chloride, acetaminophen **OR** acetaminophen, alteplase, heparin, lidocaine (PF), lidocaine-prilocaine, loperamide, ondansetron **OR** ondansetron (ZOFRAN) IV, pentafluoroprop-tetrafluoroeth  Assessment/ Plan:    [name]. is a 74 y.o. 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  Acute kidney failure secondary to lithium toxicity.  Baseline creatinine 3.73 on 05/17/19.  Required one treatment of hemodialysis this admission No acute indication for dialysis at this time. Nonoliguric urine output.  Outpatient planning for peritoneal dialysis - discontinued lithium - discontinue dialysis catheter tomorrow  2. Hypernatremia: free water deficit of 3.4 liters. - liberalized water intake - Increase Dextrose infusion to 16m/hr  3. Bradycardia: chronic. Appreciate cardiology input.   4. Hypertension: 157/74. Home regimen of amlodipine, amiloride, benazepril, hydralazine.  Currently just getting amlodipine.  Recommend holding amiloride for now.   5. Anemia of chronic kidney disease: macrocytic. Hemoglobin 10.3. No  indication for ESA at this time.   6. Secondary Hyperparathyroidism: PTH low for level of kidney function at 105. Calcium and phosphorus at goal. No indication for vitamin D agents or binders.    7. Bipolar Disorder: discontinue lithium - quetiapine.    LOS: 2 Luis Miller 5/15/202110:30 AM

## 2019-08-04 NOTE — Progress Notes (Signed)
PROGRESS NOTE    Luis Miller.  SNK:[id number] DOB: 1946/02/10 DOA: 08/01/2019 PCP: Volney American, PA-C      Brief Narrative:  Luis Miller is a 74 y.o. 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  Patient had had 2 weeks generalized malaise, decreased UOP, "shakes", dizziness.  Went to [employer] office, found to have bradycardia, sent to ER.  In the ER, HR 49 but BP 126/65.  Cr 7.32, BUN 123, Bicab 11, lithium level 1.67.         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  Presented now with signs and symptoms of renal failure (bicarb 11, BUN 123, lithium toxicity).  Urine output good, electrolytes improving.  Nephrology plan no further dialysis. -Strict I/Os -Consult Nephrology  The Sodium up to 151 today. -Hypotonic fluids -Trend sodium  Lithium toxicity Lithium level normalized after dialysis. -Hold lithium, Dr. Zollie Scale will discuss with psychiatry after peritoneal dialysis has been established   Bradycardia Persistent sinus bradycardia without hemodynamic instability.  This seems stable, cardiology plan no interventions.  There are case reports of Seroquel contributing to bradycardia, although at this lowest dose, and after stopping lithium, I favor continuing this.  His bradycardia at present is not symptomatic. -Hold Lithium  Hypertension BP elevated now -Stop amiloride -Hold benazepril -Resume hydralazine -Continue amlodipine    Bipolar disorder -Hold Lithium -Continue Seroquel  Obesity BMI 31  Gout -Continue allopurinol       Disposition: Status is: Inpatient  Remains inpatient appropriate because: patient still has HR 40s and 50s, with severely decreased renal function acute on chronic, as well as may need further HD or pacer.      Dispo: The patient  is from: Home              Anticipated d/c is to: Possibly home              Anticipated d/c date is: TBD, more than 2 days              Patient currently is not medically stable to d/c.                  MDM: The below labs and imaging reports reviewed and summarized above.  Medication management as above.    DVT prophylaxis: SCDs Code Status: FULL Family Communication:     Consultants:   Cardiology  Nephrology  Procedures:   5/13 vas cath placement  5/13 dialysis session #1  Antimicrobials:      Culture data:              Subjective: Tremor and disorientation resolved.  No confusion, dizziness, weakness, or malaise.      Objective: Vitals:   08/04/19 0148 08/04/19 0354 08/04/19 0743 08/04/19 1104  BP: (!) 156/67 (!) 151/75 (!) 157/74 (!) 156/75  Pulse: (!) 54 (!) 46 (!) 43 (!) 50  Resp: 18  17 17   Temp: 98.8 F (37.1 C) (!) 97.5 F (36.4 C) 97.6 F (36.4 C) 97.8 F (36.6 C)  TempSrc: Oral Oral Oral   SpO2: 99% 98% 100% 99%  Weight: 88 kg     Height: 5' 8"  (1.727 m)       Intake/Output Summary (Last 24 hours) at 08/04/2019 1526 Last data filed at 08/04/2019 1315 Gross per  24 hour  Intake 503.14 ml  Output 2700 ml  Net -2196.86 ml   Filed Weights   08/02/19 0758 08/03/19 0428 08/04/19 0148  Weight: 91.9 kg 88.8 kg 88 kg    Examination: General appearance: obese adult male, lying in bed, NAD     HEENT: Anicteric, conjunctiva pink, lids and lashes normal, OP moist, no oral lesions, hearing normal   Skin: Normal, no rashes, warm and dry. Cardiac: Slow, normal rhythm, no murmurs, normal JVP, 1+ LE edema  Respiratory: Regular rate and rhythm, no rales or wheezds Abdomen: No TTP or guarding, no HSM  MSK: Normal muscle bulk and tone Neuro: A&O, EOMI, moves all extremities with moderate generalized weakness, no tremor.     Psych: Attention normal, afect normal, judgment and insigth normal.       Data Reviewed: I have  personally reviewed following labs and imaging studies:  CBC: Recent Labs  Lab 08/01/19 1627 08/03/19 0415 08/04/19 0722  WBC 5.0 4.9 5.4  HGB 10.3* 8.6* 10.3*  HCT 31.4* 26.0* 30.2*  MCV 108.7* 104.8* 101.7*  PLT 130* 120* 263*   Basic Metabolic Panel: Recent Labs  Lab 08/01/19 1627 08/02/19 0249 08/03/19 0415 08/04/19 0436  NA 141 142 147* 151*  K 4.8 4.6 3.4* 3.8  CL 121* 124* 123* 127*  CO2 11* 12* 19* 19*  GLUCOSE 116* 150* 107* 97  BUN 123* 123* 84* 68*  CREATININE 7.32* 7.01* 4.95* 4.36*  CALCIUM 9.2 8.5* 8.4* 8.8*  MG 2.9* 3.0*  --   --   PHOS  --   --  4.0 3.9   GFR: Estimated Creatinine Clearance: 16 mL/min (A) (by C-G formula based on SCr of 4.36 mg/dL (H)). Liver Function Tests: Recent Labs  Lab 08/03/19 0415 08/04/19 0436  ALBUMIN 3.3* 3.4*   No results for input(s): LIPASE, AMYLASE in the last 168 hours. No results for input(s): AMMONIA in the last 168 hours. Coagulation Profile: No results for input(s): INR, PROTIME in the last 168 hours. Cardiac Enzymes: No results for input(s): CKTOTAL, CKMB, CKMBINDEX, TROPONINI in the last 168 hours. BNP (last 3 results) No results for input(s): PROBNP in the last 8760 hours. HbA1C: No results for input(s): HGBA1C in the last 72 hours. CBG: Recent Labs  Lab 08/02/19 0759 08/02/19 1116 08/03/19 0729 08/03/19 1125  GLUCAP 84 172* 103* 137*   Lipid Profile: No results for input(s): CHOL, HDL, LDLCALC, TRIG, CHOLHDL, LDLDIRECT in the last 72 hours. Thyroid Function Tests: Recent Labs    08/01/19 1627  TSH 1.226   Anemia Panel: No results for input(s): VITAMINB12, FOLATE, FERRITIN, TIBC, IRON, RETICCTPCT in the last 72 hours. Urine analysis:    Component Value Date/Time   COLORURINE YELLOW (A) 08/01/2019 1824   APPEARANCEUR HAZY (A) 08/01/2019 1824   APPEARANCEUR Clear 04/24/2018 0835   LABSPEC 1.011 08/01/2019 1824   PHURINE 5.0 08/01/2019 1824   GLUCOSEU NEGATIVE 08/01/2019 1824   HGBUR  NEGATIVE 08/01/2019 1824   BILIRUBINUR NEGATIVE 08/01/2019 1824   BILIRUBINUR Negative 04/24/2018 [place] 08/01/2019 1824   PROTEINUR 30 (A) 08/01/2019 1824   NITRITE NEGATIVE 08/01/2019 1824   LEUKOCYTESUR NEGATIVE 08/01/2019 1824   Sepsis Labs: @LABRCNTIP (procalcitonin:4,lacticacidven:4)  ) Recent Results (from the past 240 hour(s))  SARS Coronavirus 2 by RT PCR (hospital order, performed in [facility] hospital lab) Nasopharyngeal Nasopharyngeal Swab     Status: None   Collection Time: 08/01/19  6:24 PM   Specimen: Nasopharyngeal Swab  Result Value Ref  Range Status   SARS Coronavirus 2 NEGATIVE NEGATIVE Final    Comment: (NOTE) SARS-CoV-2 target nucleic acids are NOT DETECTED. The SARS-CoV-2 RNA is generally detectable in upper and lower respiratory specimens during the acute phase of infection. The lowest concentration of SARS-CoV-2 viral copies this assay can detect is 250 copies / mL. A negative result does not preclude SARS-CoV-2 infection and should not be used as the sole basis for treatment or other patient management decisions.  A negative result may occur with improper specimen collection / handling, submission of specimen other than nasopharyngeal swab, presence of viral mutation(s) within the areas targeted by this assay, and inadequate number of viral copies (<250 copies / mL). A negative result must be combined with clinical observations, patient history, and epidemiological information. Fact Sheet for Patients:   StrictlyIdeas.no Fact Sheet for Healthcare Providers: BankingDealers.co.za This test is not yet approved or cleared  by the [place] FDA and has been authorized for detection and/or diagnosis of SARS-CoV-2 by FDA under an Emergency Use Authorization (EUA).  This EUA will remain in effect (meaning this test can be used) for the duration of the COVID-19 declaration under Section  564(b)(1) of the Act, 21 U.S.C. section 360bbb-3(b)(1), unless the authorization is terminated or revoked sooner. Performed at [facility], [address] [place], [place] [address]   MRSA PCR Screening     Status: None   Collection Time: 08/02/19  8:03 AM   Specimen: Nasopharyngeal  Result Value Ref Range Status   MRSA by PCR NEGATIVE NEGATIVE Final    Comment:        The GeneXpert MRSA Assay (FDA approved for NASAL specimens only), is one component of a comprehensive MRSA colonization surveillance program. It is not intended to diagnose MRSA infection nor to guide or monitor treatment for MRSA infections. Performed at [facility], [address], [place], [place] [address]          Radiology Studies: No results found.      Scheduled Meds: . allopurinol  100 mg Oral Daily  . amLODipine  10 mg Oral Daily  . Chlorhexidine Gluconate Cloth  6 each Topical Daily  . pneumococcal 23 valent vaccine  0.5 mL Intramuscular Tomorrow-1000  . QUEtiapine  25 mg Oral QHS  . sodium chloride flush  3 mL Intravenous Q12H   Continuous Infusions: . sodium chloride    . sodium chloride    . dextrose 125 mL/hr at 08/04/19 1126     LOS: 2 days    Time spent: 25 minutes    Edwin Dada, MD [facility] 08/04/2019, 3:26 PM     Please page though [place] or Epic secure chat:  For [employer], Adult nurse

## 2019-08-04 NOTE — Progress Notes (Signed)
NP Ouma responds with orders for EKG. Patient currently resting peacefully. Will continue to monitor to ensure comfort and safety.

## 2019-08-05 DIAGNOSIS — T56891A Toxic effect of other metals, accidental (unintentional), initial encounter: Secondary | ICD-10-CM

## 2019-08-05 DIAGNOSIS — R001 Bradycardia, unspecified: Secondary | ICD-10-CM

## 2019-08-05 DIAGNOSIS — T56891S Toxic effect of other metals, accidental (unintentional), sequela: Secondary | ICD-10-CM

## 2019-08-05 LAB — RENAL FUNCTION PANEL
Albumin: 3.4 g/dL — ABNORMAL LOW (ref 3.5–5.0)
Anion gap: 6 (ref 5–15)
BUN: 57 mg/dL — ABNORMAL HIGH (ref 8–23)
CO2: 19 mmol/L — ABNORMAL LOW (ref 22–32)
Calcium: 8.9 mg/dL (ref 8.9–10.3)
Chloride: 122 mmol/L — ABNORMAL HIGH (ref 98–111)
Creatinine, Ser: 3.86 mg/dL — ABNORMAL HIGH (ref 0.61–1.24)
GFR calc Af Amer: 17 mL/min — ABNORMAL LOW (ref >60)
GFR calc non Af Amer: 14 mL/min — ABNORMAL LOW (ref >60)
Glucose, Bld: 119 mg/dL — ABNORMAL HIGH (ref 70–99)
Phosphorus: 3.8 mg/dL (ref 2.5–4.6)
Potassium: 3.6 mmol/L (ref 3.5–5.1)
Sodium: 147 mmol/L — ABNORMAL HIGH (ref 135–145)

## 2019-08-05 LAB — CBC
HCT: 28.6 % — ABNORMAL LOW (ref 39.0–52.0)
Hemoglobin: 9.6 g/dL — ABNORMAL LOW (ref 13.0–17.0)
MCH: 34.3 pg — ABNORMAL HIGH (ref 26.0–34.0)
MCHC: 33.6 g/dL (ref 30.0–36.0)
MCV: 102.1 fL — ABNORMAL HIGH (ref 80.0–100.0)
Platelets: 123 10*3/uL — ABNORMAL LOW (ref 150–400)
RBC: 2.8 MIL/uL — ABNORMAL LOW (ref 4.22–5.81)
RDW: 13.4 % (ref 11.5–15.5)
WBC: 5.4 10*3/uL (ref 4.0–10.5)
nRBC: 0 % (ref 0.0–0.2)

## 2019-08-05 LAB — LITHIUM LEVEL: Lithium Lvl: 0.78 mmol/L (ref 0.60–1.20)

## 2019-08-05 MED ORDER — OLANZAPINE 2.5 MG PO TABS
2.5000 mg | ORAL_TABLET | Freq: Every day | ORAL | Status: DC
  Administered 2019-08-05: 2.5 mg via ORAL
  Filled 2019-08-05 (×2): qty 1

## 2019-08-05 MED ORDER — FLUOXETINE HCL 10 MG PO CAPS
10.0000 mg | ORAL_CAPSULE | Freq: Every day | ORAL | Status: DC
  Administered 2019-08-05 – 2019-08-06 (×2): 10 mg via ORAL
  Filled 2019-08-05 (×2): qty 1

## 2019-08-05 NOTE — Progress Notes (Signed)
[facility]  ROUNDING NOTE   Subjective:    UOP 2911mL  Na 147   Patient has no complaints.   IVF discontinued this morning.    Objective:  Vital signs in last 24 hours:  Temp:  [97.6 F (36.4 C)-98.4 F (36.9 C)] 98.2 F (36.8 C) (05/16 0514) Pulse Rate:  [40-56] 41 (05/16 0653) Resp:  [17-19] 19 (05/16 0514) BP: (146-161)/(70-80) 156/73 (05/16 0653) SpO2:  [98 %-99 %] 98 % (05/16 0514) Weight:  [89.3 kg] 89.3 kg (05/16 0514)  Weight change: 1.27 kg Filed Weights   08/03/19 0428 08/04/19 0148 08/05/19 0514  Weight: 88.8 kg 88 kg 89.3 kg    Intake/Output: I/O last 3 completed shifts: In: 2237.5 [I.V.:2237.5] Out: 4536 [Urine:3550]   Intake/Output this shift:  Total I/O In: -  Out: 300 [Urine:300]  Physical Exam: General: No acute distress  Head: Normocephalic, atraumatic. Moist oral mucosal membranes  Eyes: Anicteric  Neck: Supple, trachea midline  Lungs:  Clear to auscultation, normal effort  Heart: bradycardia  Abdomen:  Soft, nontender, bowel sounds present  Extremities: No edema.  Neurologic: Awake, alert, following commands  Skin: No lesions  Access: Right femoral dialysis catheter    Basic Metabolic Panel: Recent Labs  Lab 08/01/19 1627 08/01/19 1627 08/02/19 0249 08/02/19 0249 08/03/19 0415 08/04/19 0436 08/05/19 0537  NA 141  --  142  --  147* 151* 147*  K 4.8  --  4.6  --  3.4* 3.8 3.6  CL 121*  --  124*  --  123* 127* 122*  CO2 11*  --  12*  --  19* 19* 19*  GLUCOSE 116*  --  150*  --  107* 97 119*  BUN 123*  --  123*  --  84* 68* 57*  CREATININE 7.32*  --  7.01*  --  4.95* 4.36* 3.86*  CALCIUM 9.2   < > 8.5*   < > 8.4* 8.8* 8.9  MG 2.9*  --  3.0*  --   --   --   --   PHOS  --   --   --   --  4.0 3.9 3.8   < > = values in this interval not displayed.    Liver Function Tests: Recent Labs  Lab 08/03/19 0415 08/04/19 0436 08/05/19 0537  ALBUMIN 3.3* 3.4* 3.4*   No results for input(s): LIPASE, AMYLASE in the  last 168 hours. No results for input(s): AMMONIA in the last 168 hours.  CBC: Recent Labs  Lab 08/01/19 1627 08/03/19 0415 08/04/19 0722 08/05/19 0537  WBC 5.0 4.9 5.4 5.4  HGB 10.3* 8.6* 10.3* 9.6*  HCT 31.4* 26.0* 30.2* 28.6*  MCV 108.7* 104.8* 101.7* 102.1*  PLT 130* 120* 131* 123*    Cardiac Enzymes: No results for input(s): CKTOTAL, CKMB, CKMBINDEX, TROPONINI in the last 168 hours.  BNP: Invalid input(s): POCBNP  CBG: Recent Labs  Lab 08/02/19 0759 08/02/19 1116 08/03/19 0729 08/03/19 1125  GLUCAP 84 172* 103* 137*    Microbiology: Results for orders placed or performed during the hospital encounter of 08/01/19  SARS Coronavirus 2 by RT PCR (hospital order, performed in [facility] hospital lab) Nasopharyngeal Nasopharyngeal Swab     Status: None   Collection Time: 08/01/19  6:24 PM   Specimen: Nasopharyngeal Swab  Result Value Ref Range Status   SARS Coronavirus 2 NEGATIVE NEGATIVE Final    Comment: (NOTE) SARS-CoV-2 target nucleic acids are NOT DETECTED. The SARS-CoV-2 RNA is generally detectable in  upper and lower respiratory specimens during the acute phase of infection. The lowest concentration of SARS-CoV-2 viral copies this assay can detect is 250 copies / mL. A negative result does not preclude SARS-CoV-2 infection and should not be used as the sole basis for treatment or other patient management decisions.  A negative result may occur with improper specimen collection / handling, submission of specimen other than nasopharyngeal swab, presence of viral mutation(s) within the areas targeted by this assay, and inadequate number of viral copies (<250 copies / mL). A negative result must be combined with clinical observations, patient history, and epidemiological information. Fact Sheet for Patients:   StrictlyIdeas.no Fact Sheet for Healthcare Providers: BankingDealers.co.za This test is not yet approved  or cleared  by the [place] FDA and has been authorized for detection and/or diagnosis of SARS-CoV-2 by FDA under an Emergency Use Authorization (EUA).  This EUA will remain in effect (meaning this test can be used) for the duration of the COVID-19 declaration under Section 564(b)(1) of the Act, 21 U.S.C. section 360bbb-3(b)(1), unless the authorization is terminated or revoked sooner. Performed at [facility], [address] [place], [place] [address]   MRSA PCR Screening     Status: None   Collection Time: 08/02/19  8:03 AM   Specimen: Nasopharyngeal  Result Value Ref Range Status   MRSA by PCR NEGATIVE NEGATIVE Final    Comment:        The GeneXpert MRSA Assay (FDA approved for NASAL specimens only), is one component of a comprehensive MRSA colonization surveillance program. It is not intended to diagnose MRSA infection nor to guide or monitor treatment for MRSA infections. Performed at [facility], [address] [place], [place] [address]     Coagulation Studies: No results for input(s): LABPROT, INR in the last 72 hours.  Urinalysis: No results for input(s): COLORURINE, LABSPEC, PHURINE, GLUCOSEU, HGBUR, BILIRUBINUR, KETONESUR, PROTEINUR, UROBILINOGEN, NITRITE, LEUKOCYTESUR in the last 72 hours.  Invalid input(s): APPERANCEUR    Imaging: No results found.   Medications:    . allopurinol  100 mg Oral Daily  . amLODipine  10 mg Oral Daily  . Chlorhexidine Gluconate Cloth  6 each Topical Daily  . hydrALAZINE  50 mg Oral Q8H  . pneumococcal 23 valent vaccine  0.5 mL Intramuscular Tomorrow-1000  . QUEtiapine  25 mg Oral QHS  . sodium chloride flush  3 mL Intravenous Q12H   acetaminophen **OR** acetaminophen, alteplase, heparin, lidocaine (PF), lidocaine-prilocaine, loperamide, ondansetron **OR** ondansetron (ZOFRAN) IV, pentafluoroprop-tetrafluoroeth  Assessment/ Plan:    [name]. is a 74 y.o. 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  Acute kidney failure secondary to lithium toxicity.  Baseline creatinine 3.73 on 05/17/19.  Required one treatment of hemodialysis this admission No acute indication for dialysis at this time. Nonoliguric urine output. Creatinine back to baseline Outpatient planning for peritoneal dialysis - discontinued lithium - discontinue dialysis catheter    2. Hypernatremia: free water deficit of 2.7 liters.  - liberalized water intake  3. Bradycardia: chronic. Appreciate cardiology input.   4. Hypertension: Home regimen of  amlodipine, amiloride, benazepril, hydralazine.  Currently just getting amlodipine.  Recommend holding amiloride for now.   5. Anemia of chronic kidney disease: macrocytic. Hemoglobin 9.6.  Has not received ESA this admission.   6. Secondary Hyperparathyroidism: PTH low for level of kidney function at 105. Calcium and phosphorus at goal. No indication for vitamin D agents or binders.    7. Bipolar Disorder: discontinued lithium - quetiapine.  - Patient is concern being off lithium.    LOS: 3 Sarath Kolluru 5/16/202110:02 AM

## 2019-08-05 NOTE — Progress Notes (Addendum)
Physical Therapy Evaluation Patient Details Name: Luis Miller. MRN: [id number] DOB: 06-May-1945 Today's Date: 08/05/2019   History of Present Illness  Per MD note:Luis Miller. is a 74 y.o. male with medical history significant for CKD stage IV, bipolar disorder, and hypertension who presents to the ED from his PCP office for evaluation of bradycardia.  Patient reports about 2 weeks of generally feeling unwell with fatigue/lethargy, shakes, and decreased urine output.  He states he has felt off balance but has not fallen or lost consciousness.  He went to his primary care office earlier today for further evaluation and was noted to be bradycardic and was subsequently sent to the ED for further evaluation.  Clinical Impression  Patient agrees to PT evaluation. Pt reports no pain. Pt lives alone with a level entry into his home. Pt ambulated without AD prior to this hospital admission. Pt has 3/5 strength BLE hip and knee and is MI for bed mobility, MI for transfers sit to stand with RW. Pt ambulates with RW 100 feet with min assist and no reports of pain. Pt has WNL static sitting balance and fair static/dynamic standing balance and needs UE support with RW. Pt will continue to benefit from skilled PT to improve mobility and strength.   Follow Up Recommendations Home health PT    Equipment Recommendations  Rolling walker with 5" wheels    Recommendations for Other Services       Precautions / Restrictions Restrictions Weight Bearing Restrictions: No      Mobility  Bed Mobility Overal bed mobility: Modified Independent                Transfers Overall transfer level: Needs assistance Equipment used: Rolling walker (2 wheeled) Transfers: Sit to/from Stand Sit to Stand: Min assist         General transfer comment: cues for safety  Ambulation/Gait Ambulation/Gait assistance: Min guard Gait Distance (Feet): 100 Feet Assistive device: Rolling walker (2  wheeled) Gait Pattern/deviations: Step-to pattern Gait velocity: (decreased)      Stairs            Wheelchair Mobility    Modified Rankin (Stroke Patients Only)       Balance Overall balance assessment: Mild deficits observed, not formally tested                                           Pertinent Vitals/Pain Pain Assessment: No/denies pain    Home Living Family/patient expects to be discharged to:: Private residence Living Arrangements: Alone Available Help at Discharge: Friend(s)   Home Access: Level entry       Home Equipment: (none)      Prior Function Level of Independence: Independent               Hand Dominance        Extremity/Trunk Assessment   Upper Extremity Assessment Upper Extremity Assessment: Overall WFL for tasks assessed    Lower Extremity Assessment Lower Extremity Assessment: Generalized weakness       Communication   Communication: No difficulties  Cognition Arousal/Alertness: Awake/alert Behavior During Therapy: WFL for tasks assessed/performed Overall Cognitive Status: Within Functional Limits for tasks assessed  General Comments      Exercises     Assessment/Plan    PT Assessment Patient needs continued PT services  PT Problem List Decreased strength;Decreased balance;Decreased mobility       PT Treatment Interventions Gait training;Therapeutic activities;Therapeutic exercise    PT Goals (Current goals can be found in the Care Plan section)  Acute Rehab PT Goals Patient Stated Goal: to get stronger PT Goal Formulation: With patient Time For Goal Achievement: 08/19/19 Potential to Achieve Goals: Good    Frequency Min 2X/week   Barriers to discharge        Co-evaluation               AM-PAC PT "6 Clicks" Mobility  Outcome Measure Help needed turning from your back to your side while in a flat bed without using  bedrails?: None Help needed moving from lying on your back to sitting on the side of a flat bed without using bedrails?: None Help needed moving to and from a bed to a chair (including a wheelchair)?: None Help needed standing up from a chair using your arms (e.g., wheelchair or bedside chair)?: A Little Help needed to walk in hospital room?: A Little Help needed climbing 3-5 steps with a railing? : A Little 6 Click Score: 21    End of Session Equipment Utilized During Treatment: Gait belt Activity Tolerance: Patient limited by fatigue Patient left: in bed;with bed alarm set Nurse Communication: Mobility status PT Visit Diagnosis: Unsteadiness on feet (R26.81);Muscle weakness (generalized) (M62.81);Difficulty in walking, not elsewhere classified (R26.2)    Time: 1130-1150 PT Time Calculation (min) (ACUTE ONLY): 20 min   Charges:   PT Evaluation $PT Eval Low Complexity: 1 Low PT Treatments $Gait Training: 8-22 mins        Alanson Puls, PT DPT 08/05/2019, 12:24 PM

## 2019-08-05 NOTE — Progress Notes (Signed)
PROGRESS NOTE    Luis Miller.  ZHG:[id number] DOB: 18-Nov-1945 DOA: 08/01/2019 PCP: Volney American, PA-C      Brief Narrative:  Luis Miller is a 74 y.o. 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  Patient had had 2 weeks generalized malaise, decreased UOP, "shakes", dizziness.  Went to [facility] office, found to have bradycardia, sent to ER.  In the ER, HR 49 but BP 126/65.  Cr 7.32, BUN 123, Bicab 11, lithium level 1.67.         Assessment & Plan:  Acute renal failure on CKD stage IV Good urine output, no further plans for dialysis. -Consult nephrology, appreciate recommendations   Lithium toxicity Lithium level back to therapeutic range.  Symptoms of lithium toxicity improved but not completely resolved.   -Hold lithium -If the lithium is dialyzed by peritoneal dialysis, long-term continue lithium use may be possible; if peritoneal dialysis does not remove lithium this seems like an unsafe plan  Hypernatremia Sodium down to 147 with hypotonic fluids yesterday. 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  Currently his mentation and blood pressure are good with his sinus bradycardia.  His junctional rhythm has resolved after dialyzing off lithium excess.  Bipolar disorder -Continue Seroquel -Stop lithium -Consult psychiatry for alternative regimens without lithium  Obesity BMI 31  Gout -Continue allopurinol    Disposition: Status is: Inpatient  Remains inpatient appropriate because ongoing severe electrolyte derangements (hypernatremia), and it is my medical opinion that he should remain in the hospital for 24 hours to ensure that his sodium can be maintained with oral intake,    Dispo: The patient is from: Home              Anticipated d/c is  to: Possibly home, likely with home health              Anticipated d/c date is: Possibly tomorrow              Patient currently is not medically stable to d/c.                  MDM: The below labs and imaging reports reviewed and summarized above.  Medication management as above.    DVT prophylaxis: SCDs Code Status: FULL Family Communication:     Consultants:   Cardiology  Nephrology  Procedures:   5/13 vas cath placement  5/13 dialysis session #1  5/15 remove HD catheter  Antimicrobials:      Culture data:              Subjective: No more tremor, disorientation.  Confusion is improved.  Dizziness is slight.  No weakness malaise, syncope, vomiting, chest pain.   Objective: Vitals:   08/04/19 2350 08/05/19 0514 08/05/19 0653 08/05/19 1147  BP: (!) 152/72 (!) 146/80 (!) 156/73 (!) 169/72  Pulse: (!) 40 (!) 56 (!) 41 (!) 43  Resp:  19  17  Temp:  98.2 F (36.8 C)  97.8 F (36.6 C)  TempSrc:  Oral    SpO2:  98%  98%  Weight:  89.3 kg    Height:        Intake/Output Summary (Last 24 hours) at 08/05/2019 1317 Last data filed at 08/05/2019 1229 Gross per 24 hour  Intake 1854.25 ml  Output  2675 ml  Net -820.75 ml   Filed Weights   08/03/19 0428 08/04/19 0148 08/05/19 0514  Weight: 88.8 kg 88 kg 89.3 kg    Examination: General appearance: Obese adult male, lying in bed, no acute distress     HEENT: Anicteric, conjunctival pink, lids and lashes normal.  No nasal deformity, discharge, or epistaxis.  Hearing normal. Skin: No suspicious rashes or lesions Cardiac: RRR, normal S1-S2, no murmurs, JVP not visible, trace extremity edema Respiratory: Normal respiratory rate, no rales or wheezes Abdomen: No tenderness palpation or guarding, no hepatosplenomegaly MSK: Normal muscle bulk and tone Neuro: Extraocular movements intact, moves all extremities with generalized weakness, no tremor Psych: Attention normal, affect blunted, judgment  insight appear normal      Data Reviewed: I have personally reviewed following labs and imaging studies:  CBC: Recent Labs  Lab 08/01/19 1627 08/03/19 0415 08/04/19 0722 08/05/19 0537  WBC 5.0 4.9 5.4 5.4  HGB 10.3* 8.6* 10.3* 9.6*  HCT 31.4* 26.0* 30.2* 28.6*  MCV 108.7* 104.8* 101.7* 102.1*  PLT 130* 120* 131* 606*   Basic Metabolic Panel: Recent Labs  Lab 08/01/19 1627 08/02/19 0249 08/03/19 0415 08/04/19 0436 08/05/19 0537  NA 141 142 147* 151* 147*  K 4.8 4.6 3.4* 3.8 3.6  CL 121* 124* 123* 127* 122*  CO2 11* 12* 19* 19* 19*  GLUCOSE 116* 150* 107* 97 119*  BUN 123* 123* 84* 68* 57*  CREATININE 7.32* 7.01* 4.95* 4.36* 3.86*  CALCIUM 9.2 8.5* 8.4* 8.8* 8.9  MG 2.9* 3.0*  --   --   --   PHOS  --   --  4.0 3.9 3.8   GFR: Estimated Creatinine Clearance: 18.2 mL/min (A) (by C-G formula based on SCr of 3.86 mg/dL (H)). Liver Function Tests: Recent Labs  Lab 08/03/19 0415 08/04/19 0436 08/05/19 0537  ALBUMIN 3.3* 3.4* 3.4*   No results for input(s): LIPASE, AMYLASE in the last 168 hours. No results for input(s): AMMONIA in the last 168 hours. Coagulation Profile: No results for input(s): INR, PROTIME in the last 168 hours. Cardiac Enzymes: No results for input(s): CKTOTAL, CKMB, CKMBINDEX, TROPONINI in the last 168 hours. BNP (last 3 results) No results for input(s): PROBNP in the last 8760 hours. HbA1C: No results for input(s): HGBA1C in the last 72 hours. CBG: Recent Labs  Lab 08/02/19 0759 08/02/19 1116 08/03/19 0729 08/03/19 1125  GLUCAP 84 172* 103* 137*   Lipid Profile: No results for input(s): CHOL, HDL, LDLCALC, TRIG, CHOLHDL, LDLDIRECT in the last 72 hours. Thyroid Function Tests: No results for input(s): TSH, T4TOTAL, FREET4, T3FREE, THYROIDAB in the last 72 hours. Anemia Panel: No results for input(s): VITAMINB12, FOLATE, FERRITIN, TIBC, IRON, RETICCTPCT in the last 72 hours. Urine analysis:    Component Value Date/Time    COLORURINE YELLOW (A) 08/01/2019 1824   APPEARANCEUR HAZY (A) 08/01/2019 1824   APPEARANCEUR Clear 04/24/2018 0835   LABSPEC 1.011 08/01/2019 1824   PHURINE 5.0 08/01/2019 1824   GLUCOSEU NEGATIVE 08/01/2019 1824   HGBUR NEGATIVE 08/01/2019 1824   BILIRUBINUR NEGATIVE 08/01/2019 1824   BILIRUBINUR Negative 04/24/2018 [place] 08/01/2019 1824   PROTEINUR 30 (A) 08/01/2019 1824   NITRITE NEGATIVE 08/01/2019 1824   LEUKOCYTESUR NEGATIVE 08/01/2019 1824   Sepsis Labs: @LABRCNTIP (procalcitonin:4,lacticacidven:4)  ) Recent Results (from the past 240 hour(s))  SARS Coronavirus 2 by RT PCR (hospital order, performed in [place] hospital lab) Nasopharyngeal Nasopharyngeal Swab     Status: None   Collection  Time: 08/01/19  6:24 PM   Specimen: Nasopharyngeal Swab  Result Value Ref Range Status   SARS Coronavirus 2 NEGATIVE NEGATIVE Final    Comment: (NOTE) SARS-CoV-2 target nucleic acids are NOT DETECTED. The SARS-CoV-2 RNA is generally detectable in upper and lower respiratory specimens during the acute phase of infection. The lowest concentration of SARS-CoV-2 viral copies this assay can detect is 250 copies / mL. A negative result does not preclude SARS-CoV-2 infection and should not be used as the sole basis for treatment or other patient management decisions.  A negative result may occur with improper specimen collection / handling, submission of specimen other than nasopharyngeal swab, presence of viral mutation(s) within the areas targeted by this assay, and inadequate number of viral copies (<250 copies / mL). A negative result must be combined with clinical observations, patient history, and epidemiological information. Fact Sheet for Patients:   StrictlyIdeas.no Fact Sheet for Healthcare Providers: BankingDealers.co.za This test is not yet approved or cleared  by the [place] FDA and has been authorized  for detection and/or diagnosis of SARS-CoV-2 by FDA under an Emergency Use Authorization (EUA).  This EUA will remain in effect (meaning this test can be used) for the duration of the COVID-19 declaration under Section 564(b)(1) of the Act, 21 U.S.C. section 360bbb-3(b)(1), unless the authorization is terminated or revoked sooner. Performed at [facility], [address] [place], [place] [address]   MRSA PCR Screening     Status: None   Collection Time: 08/02/19  8:03 AM   Specimen: Nasopharyngeal  Result Value Ref Range Status   MRSA by PCR NEGATIVE NEGATIVE Final    Comment:        The GeneXpert MRSA Assay (FDA approved for NASAL specimens only), is one component of a comprehensive MRSA colonization surveillance program. It is not intended to diagnose MRSA infection nor to guide or monitor treatment for MRSA infections. Performed at [facility], [address], [place], [place] [address]          Radiology Studies: No results found.      Scheduled Meds: . allopurinol  100 mg Oral Daily  . amLODipine  10 mg Oral Daily  . hydrALAZINE  50 mg Oral Q8H  . pneumococcal 23 valent vaccine  0.5 mL Intramuscular Tomorrow-1000  . QUEtiapine  25 mg Oral QHS  . sodium chloride flush  3 mL Intravenous Q12H   Continuous Infusions:    LOS: 3 days    Time spent: 25 minutes    Edwin Dada, MD [facility] 08/05/2019, 1:17 PM     Please page though [place] or Epic secure chat:  For [employer], Adult nurse

## 2019-08-05 NOTE — Plan of Care (Signed)
  Problem: Education: Goal: Understanding of CV disease, CV risk reduction, and recovery process will improve Outcome: Progressing Goal: Individualized Educational Video(s) Outcome: Progressing   Problem: Activity: Goal: Ability to return to baseline activity level will improve Outcome: Progressing   Problem: Cardiovascular: Goal: Ability to achieve and maintain adequate cardiovascular perfusion will improve Outcome: Progressing Goal: Vascular access site(s) Level 0-1 will be maintained Outcome: Progressing   Problem: Health Behavior/Discharge Planning: Goal: Ability to safely manage health-related needs after discharge will improve Outcome: Progressing   Problem: Education: Goal: Knowledge of General Education information will improve Description: Including pain rating scale, medication(s)/side effects and non-pharmacologic comfort measures Outcome: Progressing   Problem: Nutrition: Goal: Adequate nutrition will be maintained Outcome: Progressing   Problem: Elimination: Goal: Will not experience complications related to bowel motility Outcome: Progressing Goal: Will not experience complications related to urinary retention Outcome: Progressing   Problem: Safety: Goal: Ability to remain free from injury will improve Outcome: Progressing   Problem: Skin Integrity: Goal: Risk for impaired skin integrity will decrease Outcome: Progressing

## 2019-08-05 NOTE — Consult Note (Signed)
[place] Psychiatry Consult   Reason for Consult: Lithium toxicity Referring Physician: Dr. Loleta Books Patient Identification: Luis Miller. MRN:  [id number] Principal Diagnosis: Acute renal failure superimposed on stage 4 chronic kidney disease (HCC) Diagnosis:  Principal Problem:   Acute renal failure superimposed on stage 4 chronic kidney disease (HCC) Active Problems:   Hypertension   Bipolar disorder, in full remission, most recent episode depressed (HCC)   Acute renal failure (ARF) (HCC)   Junctional bradycardia   Lithium toxicity   Total Time spent with patient: 1 hour  Subjective:   Luis Miller. is a 74 y.o. male patient admitted with lithium toxicity in the setting of chronic kidney disease stage V.  Patient has a history of bipolar depression on lithium daily, who presented with lithium level 1.66, bradycardia, and 2 weeks of malaise.  Psych consult was placed for medication management, recommendations for discontinuing of lithium.  HPI:   Luis Miller is a 74 year old male with bipolar previously controlled with lithium.  Patient reports being on lithium for too many years to remember.  He reports upon discontinuation of lithium he developed a severe state of depression, that ultimately needs to suicidal thoughts.  He denies any previous suicide attempts with cessation of lithium, however goes on to say he is unable to recall if he is ever attempted suicide.  Patient reports being stable on lithium prior to this admission, he has been seeing his outpatient psychiatrist via telepsych.  He states his previous provider recently retired and he was referred to Dr. Robina Ade for ongoing management of condition above.  He states his nephrologist is concerned about the lithium and his levels not being managed appropriately.  Patient denies any manic episodes, grandiosity, delusions, irritability, or sexually inappropriate behavior.  He denies any depressive symptoms, weight loss or  weight gain, sadness, recurrent thoughts of death, anhedonia, and or guilt.  He does endorse insomnia which has been managed by oral Seroquel for several years as per patient.  He does not appear to be responding to internal stimuli, and external stimuli, and or preoccupied.  He presents with logical thought processes, and coherent speech.  At this time he denies any suicidal ideations, homicidal ideations, and or hallucinations.  Past Psychiatric History: Bipolar depression, controlled on lithium.  Patient reports previously trying and failing Zoloft.  He is unable to recall any additional medications that he has tried in the past.  He reports outpatient therapist [facility], where he is being managed by Dr. Robina Ade.  His last outpatient appointment was April 15, it is noted that olanzapine was started 2.5 p.o. nightly.  Per chart review patient was aware chronic kidney disease and affects of lithium, and did not want to have relapse of his bipolar depression.  It also appears that due to insurance coverage he requested to take olanzapine instead of quetiapine.  Patient does report previous history of inpatient admission as a result of abrupt lithium discontinuation.  Patient denies any previous suicide attempts.  Risk to Self:   Risk to Others:   Prior Inpatient Therapy:   Prior Outpatient Therapy:    Past Medical History:  Past Medical History:  Diagnosis Date  . Anemia   . Apnea, sleep   . Arthritis   . Bipolar affective ([place])   . Cancer ([place]) malignant melanoma  . CRI (chronic renal insufficiency)   . Crohn's disease ([place])   . Depression   . Diabetes insipidus ([place])   . Erectile  dysfunction   . Gout   . Hyperlipidemia   . Hypertension   . IBS (irritable bowel syndrome)   . Impotence   . Internal hemorrhoids   . 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[place])   . Paraphimosis   . Peyronie disease   . Secondary hyperparathyroidism of renal origin ([place])   . Skin  cancer   . Testicular hypofunction   . Urinary frequency   . Urinary hesitancy     Past Surgical History:  Procedure Laterality Date  . arm fracture    . BONE MARROW BIOPSY    . COLONOSCOPY WITH PROPOFOL N/A 12/23/2014   Procedure: COLONOSCOPY WITH PROPOFOL;  Surgeon: Manya Silvas, MD;  Location: [facility] ENDOSCOPY;  Service: Endoscopy;  Laterality: N/A;  . COLONOSCOPY WITH PROPOFOL N/A 02/06/2018   Procedure: COLONOSCOPY WITH PROPOFOL;  Surgeon: Manya Silvas, MD;  Location: [facility] ENDOSCOPY;  Service: Endoscopy;  Laterality: N/A;  . KNEE ARTHROPLASTY Left 02/22/2018   Procedure: COMPUTER ASSISTED TOTAL KNEE ARTHROPLASTY;  Surgeon: Dereck Leep, MD;  Location: [facility] ORS;  Service: Orthopedics;  Laterality: Left;  Marland Kitchen MELANOMA EXCISION    . PENILE PROSTHESIS IMPLANT    . PENILE PROSTHESIS IMPLANT N/A 08/25/2015   Procedure: REPLACEMENT OF INFLATABLE PENILE PROSTHESIS COMPONENTS;  Surgeon: Cleon Gustin, MD;  Location: [facility] ORS;  Service: Urology;  Laterality: N/A;  . SKIN CANCER EXCISION     nose  . TONSILLECTOMY    . VASECTOMY     Family History:  Family History  Problem Relation Age of Onset  . Depression Sister   . Bipolar disorder Other   . Prostate cancer Neg Hx   . Kidney cancer Neg Hx   . Bladder Cancer Neg Hx    Family Psychiatric  History: As per patient nephew has bipolar.  He denies any family members with suicide attempts and or suicide completion. Social History:  Social History   Substance and Sexual Activity  Alcohol Use Not Currently  . Alcohol/week: 0.0 standard drinks   Comment: Occasional glass of wine     Social History   Substance and Sexual Activity  Drug Use No    Social History   Socioeconomic History  . Marital status: Divorced    Spouse name: Not on file  . Number of children: 2  . Years of education: Not on file  . Highest education level: High school graduate  Occupational History  . Occupation: retired  Tobacco Use  . Smoking  status: Former Smoker    Types: Cigarettes    Quit date: 09/08/1972    Years since quitting: 46.9  . Smokeless tobacco: Never Used  Substance and Sexual Activity  . Alcohol use: Not Currently    Alcohol/week: 0.0 standard drinks    Comment: Occasional glass of wine  . Drug use: No  . Sexual activity: Yes    Partners: Female    Birth control/protection: None  Other Topics Concern  . Not on file  Social History Narrative  . Not on file   Social Determinants of Health   Financial Resource Strain:   . Difficulty of Paying Living Expenses:   Food Insecurity:   . Worried About Charity fundraiser in the Last Year:   . Arboriculturist in the Last Year:   Transportation Needs:   . Film/video editor (Medical):   Marland Kitchen Lack of Transportation (Non-Medical):   Physical Activity: Inactive  . Days of Exercise per Week: 0 days  . Minutes of  Exercise per Session: 0 min  Stress:   . Feeling of Stress :   Social Connections:   . Frequency of Communication with Friends and Family:   . Frequency of Social Gatherings with Friends and Family:   . Attends Religious Services:   . Active Member of Clubs or Organizations:   . Attends Archivist Meetings:   Marland Kitchen Marital Status:    Additional Social History:    Allergies:   Allergies  Allergen Reactions  . Mirtazapine Rash  . Aspirin     Other reaction(s): Unknown Patient was instructed not to take aspirin but can not remember why  . Atomoxetine Other (See Comments)    Urinary sx  . Strattera [Atomoxetine Hcl] Other (See Comments)    Urinary sx  . Xyzal [Levocetirizine Dihydrochloride] Rash    Patient reported on 11/02/17    Labs:  Results for orders placed or performed during the hospital encounter of 08/01/19 (from the past 48 hour(s))  Renal function panel     Status: Abnormal   Collection Time: 08/04/19  4:36 AM  Result Value Ref Range   Sodium 151 (H) 135 - 145 mmol/L   Potassium 3.8 3.5 - 5.1 mmol/L   Chloride 127  (H) 98 - 111 mmol/L   CO2 19 (L) 22 - 32 mmol/L   Glucose, Bld 97 70 - 99 mg/dL    Comment: Glucose reference range applies only to samples taken after fasting for at least 8 hours.   BUN 68 (H) 8 - 23 mg/dL   Creatinine, Ser 4.36 (H) 0.61 - 1.24 mg/dL   Calcium 8.8 (L) 8.9 - 10.3 mg/dL   Phosphorus 3.9 2.5 - 4.6 mg/dL   Albumin 3.4 (L) 3.5 - 5.0 g/dL   GFR calc non Af Amer 12 (L) >60 mL/min   GFR calc Af Amer 14 (L) >60 mL/min   Anion gap 5 5 - 15    Comment: Performed at [facility], [address] [place], [place] [address]  CBC     Status: Abnormal   Collection Time: 08/04/19  7:22 AM  Result Value Ref Range   WBC 5.4 4.0 - 10.5 K/uL   RBC 2.97 (L) 4.22 - 5.81 MIL/uL   Hemoglobin 10.3 (L) 13.0 - 17.0 g/dL   HCT 30.2 (L) 39.0 - 52.0 %   MCV 101.7 (H) 80.0 - 100.0 fL   MCH 34.7 (H) 26.0 - 34.0 pg   MCHC 34.1 30.0 - 36.0 g/dL   RDW 13.5 11.5 - 15.5 %   Platelets 131 (L) 150 - 400 K/uL   nRBC 0.0 0.0 - 0.2 %    Comment: Performed at [facility], [address] [place], [place] [address]  Renal function panel     Status: Abnormal   Collection Time: 08/05/19  5:37 AM  Result Value Ref Range   Sodium 147 (H) 135 - 145 mmol/L   Potassium 3.6 3.5 - 5.1 mmol/L   Chloride 122 (H) 98 - 111 mmol/L   CO2 19 (L) 22 - 32 mmol/L   Glucose, Bld 119 (H) 70 - 99 mg/dL    Comment: Glucose reference range applies only to samples taken after fasting for at least 8 hours.   BUN 57 (H) 8 - 23 mg/dL   Creatinine, Ser 3.86 (H) 0.61 - 1.24 mg/dL   Calcium 8.9 8.9 - 10.3 mg/dL   Phosphorus 3.8 2.5 - 4.6 mg/dL   Albumin 3.4 (L) 3.5 - 5.0 g/dL  GFR calc non Af Amer 14 (L) >60 mL/min   GFR calc Af Amer 17 (L) >60 mL/min   Anion gap 6 5 - 15    Comment: Performed at [facility], [address] [place], [place] [address]  CBC     Status: Abnormal   Collection Time: 08/05/19  5:37 AM  Result Value Ref Range   WBC 5.4 4.0 - 10.5 K/uL   RBC 2.80 (L) 4.22 - 5.81  MIL/uL   Hemoglobin 9.6 (L) 13.0 - 17.0 g/dL   HCT 28.6 (L) 39.0 - 52.0 %   MCV 102.1 (H) 80.0 - 100.0 fL   MCH 34.3 (H) 26.0 - 34.0 pg   MCHC 33.6 30.0 - 36.0 g/dL   RDW 13.4 11.5 - 15.5 %   Platelets 123 (L) 150 - 400 K/uL    Comment: Immature Platelet Fraction may be clinically indicated, consider ordering this additional test [id number]    nRBC 0.0 0.0 - 0.2 %    Comment: Performed at [facility], [address] [place], [place] [address]  Lithium level     Status: None   Collection Time: 08/05/19  5:37 AM  Result Value Ref Range   Lithium Lvl 0.78 0.60 - 1.20 mmol/L    Comment: Performed at [facility], [address], [place],  [address]    Current Facility-Administered Medications  Medication Dose Route Frequency Provider Last Rate Last Admin  . acetaminophen (TYLENOL) tablet 650 mg  650 mg Oral Q6H PRN Lenore Cordia, MD       Or  . acetaminophen (TYLENOL) suppository 650 mg  650 mg Rectal Q6H PRN Zada Finders R, MD      . allopurinol (ZYLOPRIM) tablet 100 mg  100 mg Oral Daily Edwin Dada, MD   100 mg at 08/05/19 0952  . alteplase (CATHFLO ACTIVASE) injection 2 mg  2 mg Intracatheter Once PRN Bhutani, Manpreet S, MD      . amLODipine (NORVASC) tablet 10 mg  10 mg Oral Daily Danford, Suann Larry, MD   10 mg at 08/04/19 0845  . heparin injection 1,000 Units  1,000 Units Dialysis PRN Liana Gerold, MD   3,600 Units at 08/02/19 1626  . hydrALAZINE (APRESOLINE) tablet 50 mg  50 mg Oral Q8H Danford, Suann Larry, MD   50 mg at 08/05/19 0655  . lidocaine (PF) (XYLOCAINE) 1 % injection 5 mL  5 mL Intradermal PRN Bhutani, Manpreet S, MD      . lidocaine-prilocaine (EMLA) cream 1 application  1 application Topical PRN Bhutani, Manpreet S, MD      . loperamide (IMODIUM) capsule 2 mg  2 mg Oral PRN Lang Snow, NP   2 mg at 08/04/19 2353  . ondansetron (ZOFRAN) tablet 4 mg  4 mg Oral Q6H PRN Lenore Cordia, MD       Or   . ondansetron (ZOFRAN) injection 4 mg  4 mg Intravenous Q6H PRN Lenore Cordia, MD      . pentafluoroprop-tetrafluoroeth (GEBAUERS) aerosol 1 application  1 application Topical PRN Bhutani, Manpreet S, MD      . pneumococcal 23 valent vaccine (PNEUMOVAX-23) injection 0.5 mL  0.5 mL Intramuscular Tomorrow-1000 Danford, Suann Larry, MD      . QUEtiapine (SEROQUEL) tablet 25 mg  25 mg Oral QHS Edwin Dada, MD   25 mg at 08/04/19 2352  . sodium chloride flush (NS) 0.9 % injection 3 mL  3 mL Intravenous Q12H  Lenore Cordia, MD   3 mL at 08/05/19 7408    Musculoskeletal: Strength & Muscle Tone: within normal limits Gait & Station: normal Patient leans: N/A  Psychiatric Specialty Exam: Physical Exam  Review of Systems  Blood pressure (!) 169/72, pulse (!) 43, temperature 97.8 F (36.6 C), resp. rate 17, height _0  (1.727 m), weight 89.3 kg, SpO2 98 %.Body mass index is 29.92 kg/m.  General Appearance: Fairly Groomed  Eye Contact:  Good  Speech:  Clear and Coherent and Normal Rate  Volume:  Normal  Mood:  Depressed  Affect:  Appropriate and Congruent  Thought Process:  Coherent, Goal Directed and Descriptions of Associations: Intact  Orientation:  Full (Time, Place, and Person)  Thought Content:  WDL  Suicidal Thoughts:  No  Homicidal Thoughts:  No  Memory:  Immediate;   Good Recent;   Good  Judgement:  Intact  Insight:  Fair  Psychomotor Activity:  Normal  Concentration:  Concentration: Fair and Attention Span: Fair  Recall:  [employer] of Knowledge:  Fair  Language:  Fair  Akathisia:  No  Handed:  Right  AIMS (if indicated):     Assets:  Communication Skills Desire for Improvement Financial Resources/Insurance Housing Intimacy Leisure Time Physical Health Resilience Social Support Talents/Skills Transportation  ADL's:  Intact  Cognition:  WNL  Sleep:        Treatment Plan Summary: Plan Will adjust medications at this time.  Practitioner  discussed in length with patient regarding discontinuation of lithium permanently due to worsening kidney disease.  Patient verbalizes understanding and appears to have insight and judgment into his current condition.  Patient is able to verbalize the need to discontinue medication, and initiate new medication while in the hospital due to previous history of worsening depression and suicidality upon discontinuation of lithium.  Lithium has already been discontinued current level is 0.78.  We will also discontinue Seroquel and start olanzapine 2.5 mg p.o. nightly.  Will initiate olanzapine to further target depressive symptoms, bipolar, and rapid onset of antipsychotic action without the need for drug titration.  Olanzapine is a preferred agent and bipolar depression, and will assist with reduction of suicidal thoughts.  No renal dose adjustment is required for oral olanzapine.  Will start fluoxetine 10 mg p.o. daily for depression.  At the time in his evaluation patient last dose of lithium was 5 days ago, and has not been resumed.  At this time patient does not meet inpatient criteria however due to previous psychiatric history of abrupt cessation of lithium will advise close monitoring by outpatient provider.  Patient is requesting a new referral to a provider that is closer to him in [facility].  Goal is to initiate medication today closely monitor for any side effects and possible discharge tomorrow.Marland Kitchen and Patient has a therapuetic amount of lithium remaining in his system, therefore he will not experience symptoms of depression or suicidal thoughts yet. It is with hope to decrease the liklihood of these symptoms with initiation of olanzapine. Recommend stressing the importance of following up with outpatient provider this week if discharged soon. He will need to be closely followed in an outpatient setting.    Disposition: No evidence of imminent risk to self or others at present.   Patient does  not meet criteria for psychiatric inpatient admission. Supportive therapy provided about ongoing stressors. Discussed crisis plan, support from social network, calling 911, coming to the Emergency Department, and calling Suicide Hotline.  Gayland Curry  Starkes-[place]ry, FNP 08/05/2019 2:33 PM

## 2019-08-05 NOTE — Progress Notes (Signed)
Progress Note  Patient Name: Luis Miller. Date of Encounter: 08/05/2019  Primary Cardiologist: [facility]  Subjective   Right femoral vein hemodialysis catheter out today Reports he feels well no complaints Blood pressures running 150s to 160s Telemetry reviewed sinus bradycardia rates in the 40s, up to 55-60 with exertion   Inpatient Medications    Scheduled Meds: . allopurinol  100 mg Oral Daily  . amLODipine  10 mg Oral Daily  . hydrALAZINE  50 mg Oral Q8H  . pneumococcal 23 valent vaccine  0.5 mL Intramuscular Tomorrow-1000  . QUEtiapine  25 mg Oral QHS  . sodium chloride flush  3 mL Intravenous Q12H   Continuous Infusions:  PRN Meds: acetaminophen **OR** acetaminophen, alteplase, heparin, lidocaine (PF), lidocaine-prilocaine, loperamide, ondansetron **OR** ondansetron (ZOFRAN) IV, pentafluoroprop-tetrafluoroeth   Vital Signs    Vitals:   08/04/19 2350 08/05/19 0514 08/05/19 0653 08/05/19 1147  BP: (!) 152/72 (!) 146/80 (!) 156/73 (!) 169/72  Pulse: (!) 40 (!) 56 (!) 41 (!) 43  Resp:  19  17  Temp:  98.2 F (36.8 C)  97.8 F (36.6 C)  TempSrc:  Oral    SpO2:  98%  98%  Weight:  89.3 kg    Height:        Intake/Output Summary (Last 24 hours) at 08/05/2019 1351 Last data filed at 08/05/2019 1346 Gross per 24 hour  Intake 1854.25 ml  Output 2675 ml  Net -820.75 ml   Last 3 Weights 08/05/2019 08/04/2019 08/03/2019  Weight (lbs) 196 lb 12.8 oz 194 lb 195 lb 12.3 oz  Weight (kg) 89.268 kg 87.998 kg 88.8 kg  Some encounter information is confidential and restricted. Go to Review Flowsheets activity to see all data.      Telemetry    Sinus bradycardia rate 40-55- Personally Reviewed  ECG     - Personally Reviewed  Physical Exam   Constitutional:  oriented to person, place, and time. No distress.  HENT:  Head: Grossly normal Eyes:  no discharge. No scleral icterus.  Neck: No JVD, no carotid bruits  Cardiovascular: Regular rate and rhythm, no  murmurs appreciated Pulmonary/Chest: Clear to auscultation bilaterally, no wheezes or rails Abdominal: Soft.  no distension.  no tenderness.  Musculoskeletal: Normal range of motion Neurological:  normal muscle tone. Coordination normal. No atrophy Skin: Skin warm and dry Psychiatric: normal affect, pleasant   Labs    High Sensitivity Troponin:  No results for input(s): TROPONINIHS in the last 720 hours.    Chemistry Recent Labs  Lab 08/03/19 0415 08/04/19 0436 08/05/19 0537  NA 147* 151* 147*  K 3.4* 3.8 3.6  CL 123* 127* 122*  CO2 19* 19* 19*  GLUCOSE 107* 97 119*  BUN 84* 68* 57*  CREATININE 4.95* 4.36* 3.86*  CALCIUM 8.4* 8.8* 8.9  ALBUMIN 3.3* 3.4* 3.4*  GFRNONAA 11* 12* 14*  GFRAA 12* 14* 17*  ANIONGAP 5 5 6      Hematology Recent Labs  Lab 08/03/19 0415 08/04/19 0722 08/05/19 0537  WBC 4.9 5.4 5.4  RBC 2.48* 2.97* 2.80*  HGB 8.6* 10.3* 9.6*  HCT 26.0* 30.2* 28.6*  MCV 104.8* 101.7* 102.1*  MCH 34.7* 34.7* 34.3*  MCHC 33.1 34.1 33.6  RDW 13.3 13.5 13.4  PLT 120* 131* 123*    BNPNo results for input(s): BNP, PROBNP in the last 168 hours.   DDimer No results for input(s): DDIMER in the last 168 hours.   Radiology    No results found.  Cardiac  Studies     Patient Profile     [name] is a 74 year old gentleman with history of chronic kidney disease stage IV, anemia of chronic disease, bipolar disorder on lithium and hypertension Who presented to the hospital by referral from primary care for rate, rectal bleeding,diarrhea  Assessment & Plan    Lithium toxicity Presenting in junctional escape rhythm In the setting of acute on chronic renal failure Hemodialysis  x1 this admission with improvement in his rhythm  -We will defer to psychiatry, given continued renal dysfunction, lithium likely not a good choice  Junctional bradycardia Most likely related to lithium toxicity  After dialysis converting to sinus bradycardia Unable to exclude  Seroquel as a cause of his sinus bradycardia but he is asymptomatic with good blood pressures --Seroquel does have some alpha-2 affinity which has the potential to induce bradycardia.  Will defer to psychiatry to see if there are other options for him   Bipolar disorder On lithium trazodone Seroquel as outpatient Lithium held Patient is concerned about coming off lithium, concerned about relapse of his bipolar -Very possible that Seroquel is contributing to his continued bradycardia  Acute on chronic renal failure Followed by nephrology as an outpatient Making good urine, plan for outpatient peritoneal dialysis  Essential hypertension ACE inhibitor has been held in the setting of renal failure Would increase hydralazine back to 100 mg 3 times daily which she was taking as outpatient Continue amlodipine 10 Suspect he will need additional agents for blood pressure control Would avoid beta-blockers, clonidine, Could try long-acting nitrates/Imdur, other option includes Cardura/doxazosin   Total encounter time more than 25 minutes  Greater than 50% was spent in counseling and coordination of care with the patient   For questions or updates, please contact [place] Please consult [email or web address] for contact info under        Signed, Ida Rogue, MD  08/05/2019, 1:51 PM

## 2019-08-05 NOTE — Progress Notes (Signed)
Temporary HD catheter removed per order. Line was intact. Pressure held at site for 30 minutes. Vaseline gauze/gauze dressing applied and is clean dry and intact. Patient aware he is on bedrest for 30 minutes.

## 2019-08-06 LAB — RENAL FUNCTION PANEL
Albumin: 3.6 g/dL (ref 3.5–5.0)
Anion gap: 7 (ref 5–15)
BUN: 58 mg/dL — ABNORMAL HIGH (ref 8–23)
CO2: 18 mmol/L — ABNORMAL LOW (ref 22–32)
Calcium: 9.1 mg/dL (ref 8.9–10.3)
Chloride: 122 mmol/L — ABNORMAL HIGH (ref 98–111)
Creatinine, Ser: 3.63 mg/dL — ABNORMAL HIGH (ref 0.61–1.24)
GFR calc Af Amer: 18 mL/min — ABNORMAL LOW (ref >60)
GFR calc non Af Amer: 16 mL/min — ABNORMAL LOW (ref >60)
Glucose, Bld: 93 mg/dL (ref 70–99)
Phosphorus: 3.8 mg/dL (ref 2.5–4.6)
Potassium: 4.2 mmol/L (ref 3.5–5.1)
Sodium: 147 mmol/L — ABNORMAL HIGH (ref 135–145)

## 2019-08-06 LAB — LITHIUM LEVEL: Lithium Lvl: 0.7 mmol/L (ref 0.60–1.20)

## 2019-08-06 LAB — CBC
HCT: 30.6 % — ABNORMAL LOW (ref 39.0–52.0)
Hemoglobin: 10.2 g/dL — ABNORMAL LOW (ref 13.0–17.0)
MCH: 34.8 pg — ABNORMAL HIGH (ref 26.0–34.0)
MCHC: 33.3 g/dL (ref 30.0–36.0)
MCV: 104.4 fL — ABNORMAL HIGH (ref 80.0–100.0)
Platelets: 141 10*3/uL — ABNORMAL LOW (ref 150–400)
RBC: 2.93 MIL/uL — ABNORMAL LOW (ref 4.22–5.81)
RDW: 13.6 % (ref 11.5–15.5)
WBC: 5.8 10*3/uL (ref 4.0–10.5)
nRBC: 0 % (ref 0.0–0.2)

## 2019-08-06 MED ORDER — BENAZEPRIL HCL 20 MG PO TABS
40.0000 mg | ORAL_TABLET | Freq: Once | ORAL | Status: AC
  Administered 2019-08-06: 40 mg via ORAL
  Filled 2019-08-06: qty 2

## 2019-08-06 MED ORDER — OLANZAPINE 2.5 MG PO TABS: 2.5000 mg | ORAL_TABLET | Freq: Every day | ORAL | 3 refills | Status: DC

## 2019-08-06 MED ORDER — HYDRALAZINE HCL 50 MG PO TABS
50.0000 mg | ORAL_TABLET | Freq: Once | ORAL | Status: AC
  Administered 2019-08-06: 50 mg via ORAL
  Filled 2019-08-06: qty 1

## 2019-08-06 MED ORDER — HYDRALAZINE HCL 50 MG PO TABS: 100.0000 mg | ORAL_TABLET | Freq: Three times a day (TID) | ORAL | Status: DC

## 2019-08-06 MED ORDER — FLUOXETINE HCL 10 MG PO CAPS: 10.0000 mg | ORAL_CAPSULE | Freq: Every day | ORAL | 3 refills | Status: DC

## 2019-08-06 NOTE — Progress Notes (Signed)
Pt refused cpap

## 2019-08-06 NOTE — Plan of Care (Signed)
  Problem: Cardiovascular: Goal: Vascular access site(s) Level 0-1 will be maintained Outcome: Progressing   Problem: Safety: Goal: Ability to remain free from injury will improve Outcome: Progressing

## 2019-08-06 NOTE — Discharge Summary (Signed)
Physician Discharge Summary  Luis Miller. UVO:[id number] DOB: March 19, 1946 DOA: 08/01/2019  PCP: Volney American, PA-C  Admit date: 08/01/2019 Discharge date: 08/06/2019  Admitted From: Home  Disposition:  Home   Recommendations for Outpatient Follow-up:  1. Follow up with psychiatry Dr. Toy Care in 1 week 2. Follow up with nephrology Dr. Holley Raring in 1 week 3. Dr. Toy Care: Please review new regimen fluoxetine and olanzapine and adjust as able 4. Dr. Holley Raring: Please repeat BMP and BP and adjust antihypertensives as needed      Home Health: PT/OT due to ongoing balance issues  Equipment/Devices: Rolling walker  Discharge Condition: Fair  CODE STATUS: FULL Diet recommendation: Renal  Brief/Interim Summary: Luis Miller is a 74 y.o. 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  Patient had had 2 weeks generalized malaise, decreased UOP, "shakes", dizziness.  Went to [facility] office, found to have bradycardia, sent to ER.  In the ER, HR 49 but BP 126/65.  Cr 7.32, BUN 123, Bicab 11, lithium level 1.67.      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  Presented now with signs and symptoms of renal failure (bicarb 11, BUN 123, lithium toxicity).  Nephrology consulted.  Temp cath placed, underwent dialysis once, with improvement of lithium level to therapeutic window and resolution of symptoms.  Subsequently, patient treated with fluids, and serum creatinine returned to baseline.  Urine output was normal.  Discharged with close nephrology follow-up.     Lithium toxicity Patient underwent dialysis once and lithium level returned to the therapeutic window.  This incidence of lithium toxicity improved, although he has  some lingering symptoms of chronic lithium toxicity it appears to me.    -Hold lithium -If lithium is dialyzed by peritoneal dialysis, long-term continue lithium use may be possible; if peritoneal dialysis does not remove lithium this would be an unsafe plan  Bipolar disorder The patient's lithium was stopped.  Psychiatry were consulted.  They recommended initiating fluoxetine, and transitioning Seroquel to olanzapine due to formulary cost.   -Recommend close psychiatry follow-up -Return precautions for suicidal ideation emphasized to patient and son  Hypernatremia Patient developed hyponatremia after stopping lithium.  He was given hypotonic fluids and this improved, his oral intake was liberalized, and he was able to maintain stable sodium 147.   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    At admission he had a junctional bradycardia delirium.  This resolved to sinus bradycardia after dialysis, he was monitored on telemetry and had no further junctional rhythms.     Obesity BMI 31  Gout Continue allopurinol           Discharge Instructions  Discharge Instructions    Discharge instructions   Complete by: As directed    From Dr. Loleta Books: You were admitted for worsening renal failure and also lithium build up to toxic levels. You were treated with dialysis to clean the blood, and your lithium was stopped.  Your kidney function appears to have returned to your previous normal, although we still suspect you will need to initiate dialysis soon.  DO NOT restart lithium.  In the long run, this may not be safe.  You must  discuss with both Dr. Toy Care and Dr. Holley Raring if this is ever safe to restart.  In the meantime, start fluoxetine/Prozac 10 mg once daily Call Dr. Starleen Arms office (number listed below) for an appiontment AS SOON AS POSSIBLE  In place of Seroquel/quetiapine, start taking  olanzapine/Zyprexa 2.5 mg nightly If you have ANY suicidal thoughts or severe depression, call 9-1-1 or come to the ER immediately    For your blood pressure, STOP taking amiloride RESUME hydralazine 100 mg three times daily Amlodipine 10 mg daily And benazepril 40 mg daily  Go see Dr. Holley Raring AS SOON AS POSSIBLE and have him check your kidney function and blood pressure   Increase activity slowly   Complete by: As directed      Allergies as of 08/06/2019      Reactions   Mirtazapine Rash   Aspirin    Other reaction(s): Unknown Patient was instructed not to take aspirin but can not remember why   Atomoxetine Other (See Comments)   Urinary sx   Strattera [atomoxetine Hcl] Other (See Comments)   Urinary sx   Xyzal [levocetirizine Dihydrochloride] Rash   Patient reported on 11/02/17      Medication List    STOP taking these medications   aMILoride 5 MG tablet Commonly known as: MIDAMOR   lithium carbonate 300 MG CR tablet Commonly known as: LITHOBID   QUEtiapine 25 MG tablet Commonly known as: SEROQUEL     TAKE these medications   allopurinol 100 MG tablet Commonly known as: ZYLOPRIM Take 200 mg by mouth daily.   amLODipine 10 MG tablet Commonly known as: NORVASC Take 1 tablet (10 mg total) by mouth daily.   benazepril 40 MG tablet Commonly known as: LOTENSIN Take 40 mg by mouth daily.   colchicine 0.6 MG tablet Take 0.5 tablets (0.3 mg total) by mouth daily as needed.   cyproheptadine 4 MG tablet Commonly known as: PERIACTIN Take one tablet three hours prior to intercourse What changed:   how much to take  how to take this  when to take this  reasons to take this  additional instructions   FLUoxetine 10 MG capsule Commonly known as: PROZAC Take 1 capsule (10 mg total) by mouth daily.   hydrALAZINE 100 MG tablet Commonly known as: APRESOLINE Take 100 mg by mouth 3 (three) times daily.   loperamide 2 MG capsule Commonly known as:  IMODIUM Take 2 mg by mouth as needed for diarrhea or loose stools.   multivitamin tablet Take 1 tablet by mouth daily.   OLANZapine 2.5 MG tablet Commonly known as: ZYPREXA Take 1 tablet (2.5 mg total) by mouth at bedtime.   traZODone 50 MG tablet Commonly known as: DESYREL Take 1 tablet (50 mg total) by mouth at bedtime as needed. What changed: reasons to take this   Vitamin D (Cholecalciferol) 25 MCG (1000 UT) Tabs Take 1,000 tablets by mouth daily.      Follow-up Information    Anthonette Legato, MD. Go on 08/13/2019.   Specialty: Nephrology Why: Appointment at 2:30pm Contact information: [place] [address] [phone]        Schedule an appointment as soon as possible for a visit with Nevada Crane, MD.   Specialty: Psychiatry Why: Call Dr. Starleen Arms office for a follow up as soon as able Contact information: [place] [address] [phone]        [facility] Follow up.   Specialty: [place]  Why: Physical Therapy, Occupational Therapy Contact information: [place] [address] [phone]          Allergies  Allergen Reactions  . Mirtazapine Rash  . Aspirin     Other reaction(s): Unknown Patient was instructed not to take aspirin but can not remember why  . Atomoxetine Other (See Comments)    Urinary sx  . Strattera [Atomoxetine Hcl] Other (See Comments)    Urinary sx  . Xyzal [Levocetirizine Dihydrochloride] Rash    Patient reported on 11/02/17    Consultations:  Nephrology  Psychiatry   Procedures/Studies: US Renal  Result Date: 08/01/2019 CLINICAL DATA:  Acute renal failure EXAM: RENAL / URINARY TRACT ULTRASOUND COMPLETE COMPARISON:  03/19/2008 FINDINGS: Right Kidney: Renal measurements: 9.5 x 5.3 x 5.3 cm. = volume: 139 mL. Diffuse increased echogenicity is noted. Cortical thinning is seen. 4.6 cm cyst is noted in the upper pole of the right kidney. Left  Kidney: Renal measurements: 10.0 x 6.2 x 4.7 cm = volume: 151 mL. Increased echogenicity is noted with cortical thinning. Multiple cysts are seen the largest of which measures 1.8 cm. Bladder: Appears normal for degree of bladder distention. Other: None. IMPRESSION: Bilateral renal cysts simple in nature. Increased echogenicity and cortical thinning consistent with medical renal disease. No obstructive changes are seen. Electronically Signed   By: Inez Catalina M.D.   On: 08/01/2019 19:33   DG Chest Portable 1 View  Result Date: 08/01/2019 CLINICAL DATA:  Acute renal fat, bradycardia, rectal bleeding, chronic kidney disease, Crohn's disease, hypertension EXAM: PORTABLE CHEST 1 VIEW COMPARISON:  Portable exam 1828 hours compared to 09/17/2010 FINDINGS: External pacing leads project over chest. Enlargement of cardiac silhouette. Mediastinal contours and pulmonary vascularity normal. Decreased lung volumes without definite pulmonary infiltrate, pleural effusion or pneumothorax. Multiple old BILATERAL rib fractures. IMPRESSION: Enlargement of cardiac silhouette. Low lung volumes without acute infiltrate. Electronically Signed   By: Lavonia Dana M.D.   On: 08/01/2019 19:04   ECHOCARDIOGRAM COMPLETE  Result Date: 08/02/2019    ECHOCARDIOGRAM REPORT   Patient Name:   Luis Miller. Date of Exam: 08/02/2019 Medical Rec #:  [id number]          Height:       68.0 in Accession #:    [id number]         Weight:       202.6 lb Date of Birth:  02-26-1946          BSA:          2.055 m Patient Age:    74 years           BP:           109/94 mmHg Patient Gender: M                  HR:           37 bpm. Exam Location:  [facility] Procedure: 2D Echo, Color Doppler and Cardiac Doppler Indications:     I48.91 Atrial fibrillation  History:         Patient has no prior history of Echocardiogram examinations.                  Risk Factors:Hypertension and Dyslipidemia.  Sonographer:     Charmayne Sheer RDCS (AE) Referring Phys:  [id number]  Cleaster Corin PATEL Diagnosing Phys: Ida Rogue MD  Sonographer Comments: No subcostal window. IMPRESSIONS  1. Left ventricular ejection fraction, by estimation, is  60 to 65%. The left ventricle has normal function. The left ventricle has no regional wall motion abnormalities. Left ventricular diastolic parameters are indeterminate.  2. Right ventricular systolic function is normal. The right ventricular size is normal. There is moderately elevated pulmonary artery systolic pressure. The estimated right ventricular systolic pressure is 58.5 mmHg.  3. Bradycardia noted, rate in the 40s FINDINGS  Left Ventricle: Left ventricular ejection fraction, by estimation, is 60 to 65%. The left ventricle has normal function. The left ventricle has no regional wall motion abnormalities. The left ventricular internal cavity size was normal in size. There is  no left ventricular hypertrophy. Left ventricular diastolic parameters are indeterminate. Right Ventricle: The right ventricular size is normal. No increase in right ventricular wall thickness. Right ventricular systolic function is normal. There is moderately elevated pulmonary artery systolic pressure. The tricuspid regurgitant velocity is 3.34 m/s, and with an assumed right atrial pressure of 10 mmHg, the estimated right ventricular systolic pressure is 27.7 mmHg. Left Atrium: Left atrial size was normal in size. Right Atrium: Right atrial size was normal in size. Pericardium: There is no evidence of pericardial effusion. Mitral Valve: The mitral valve is normal in structure. Normal mobility of the mitral valve leaflets. No evidence of mitral valve regurgitation. No evidence of mitral valve stenosis. MV peak gradient, 8.1 mmHg. The mean mitral valve gradient is 2.0 mmHg. Tricuspid Valve: The tricuspid valve is normal in structure. Tricuspid valve regurgitation is not demonstrated. No evidence of tricuspid stenosis. Aortic Valve: The aortic valve is normal in structure.  Aortic valve regurgitation is not visualized. No aortic stenosis is present. Aortic valve mean gradient measures 11.0 mmHg. Aortic valve peak gradient measures 26.2 mmHg. Aortic valve area, by VTI measures 2.38 cm. Pulmonic Valve: The pulmonic valve was normal in structure. Pulmonic valve regurgitation is not visualized. No evidence of pulmonic stenosis. Aorta: The aortic root is normal in size and structure. Venous: The inferior vena cava is normal in size with greater than 50% respiratory variability, suggesting right atrial pressure of 3 mmHg. IAS/Shunts: No atrial level shunt detected by color flow Doppler.  LEFT VENTRICLE PLAX 2D LVIDd:         4.95 cm  Diastology LVIDs:         3.19 cm  LV e' lateral:   21.60 cm/s LV PW:         1.14 cm  LV E/e' lateral: 6.1 LV IVS:        0.94 cm  LV e' medial:    11.70 cm/s LVOT diam:     2.10 cm  LV E/e' medial:  11.2 LV SV:         107 LV SV Index:   52 LVOT Area:     3.46 cm  RIGHT VENTRICLE RV Basal diam:  4.31 cm LEFT ATRIUM           Index       RIGHT ATRIUM           Index LA diam:      4.70 cm 2.29 cm/m  RA Area:     18.00 cm LA Vol (A2C): 45.8 ml 22.29 ml/m RA Volume:   48.80 ml  23.75 ml/m LA Vol (A4C): 70.5 ml 34.31 ml/m  AORTIC VALVE                    PULMONIC VALVE AV Area (Vmax):    2.12 cm     PV Vmax:  1.19 m/s AV Area (Vmean):   2.18 cm     PV Vmean:      74.100 cm/s AV Area (VTI):     2.38 cm     PV VTI:        0.245 m AV Vmax:           256.00 cm/s  PV Peak grad:  5.7 mmHg AV Vmean:          151.000 cm/s PV Mean grad:  3.0 mmHg AV VTI:            0.449 m AV Peak Grad:      26.2 mmHg AV Mean Grad:      11.0 mmHg LVOT Vmax:         157.00 cm/s LVOT Vmean:        94.900 cm/s LVOT VTI:          0.308 m LVOT/AV VTI ratio: 0.69  AORTA Ao Root diam: 3.50 cm MITRAL VALVE                TRICUSPID VALVE MV Area (PHT): 2.60 cm     TR Peak grad:   44.6 mmHg MV Peak grad:  8.1 mmHg     TR Vmax:        334.00 cm/s MV Mean grad:  2.0 mmHg MV Vmax:        1.42 m/s     SHUNTS MV Vmean:      53.9 cm/s    Systemic VTI:  0.31 m MV Decel Time: 292 msec     Systemic Diam: 2.10 cm MV E velocity: 131.33 cm/s MV A velocity: 49.80 cm/s MV E/A ratio:  2.64 Ida Rogue MD Electronically signed by Ida Rogue MD Signature Date/Time: 08/02/2019/2:51:01 PM    Final        Subjective: Patient feels stronger, less tremor, no confusion.  No fever, chest pain, dyspnea, urinary discomfort.  Discharge Exam: Vitals:   08/06/19 0752 08/06/19 1001  BP: (!) 182/83 (!) 159/74  Pulse: (!) 54 (!) 45  Resp: 18   Temp: 98.5 F (36.9 C)   SpO2: 99%    Vitals:   08/06/19 0507 08/06/19 0635 08/06/19 0752 08/06/19 1001  BP: (!) 186/84 (!) 176/78 (!) 182/83 (!) 159/74  Pulse: (!) 49 (!) 42 (!) 54 (!) 45  Resp:   18   Temp: 98.3 F (36.8 C)  98.5 F (36.9 C)   TempSrc: Oral     SpO2: 99%  99%   Weight: 89 kg     Height:        General: Pt is alert, awake, not in acute distress, lying in bed, interactive Cardiovascular: RRR, nl S1-S2, no murmurs appreciated.   No LE edema.   Respiratory: Normal respiratory rate and rhythm.  CTAB without rales or wheezes. Abdominal: Abdomen soft and non-tender.  No distension or HSM.   Neuro/Psych: Strength symmetric in upper and lower extremities.  Judgment and insight appear normal.   The results of significant diagnostics from this hospitalization (including imaging, microbiology, ancillary and laboratory) are listed below for reference.     Microbiology: Recent Results (from the past 240 hour(s))  SARS Coronavirus 2 by RT PCR (hospital order, performed in [facility] hospital lab) Nasopharyngeal Nasopharyngeal Swab     Status: None   Collection Time: 08/01/19  6:24 PM   Specimen: Nasopharyngeal Swab  Result Value Ref Range Status   SARS Coronavirus 2 NEGATIVE NEGATIVE Final    Comment: (  NOTE) SARS-CoV-2 target nucleic acids are NOT DETECTED. The SARS-CoV-2 RNA is generally detectable in upper and  lower respiratory specimens during the acute phase of infection. The lowest concentration of SARS-CoV-2 viral copies this assay can detect is 250 copies / mL. A negative result does not preclude SARS-CoV-2 infection and should not be used as the sole basis for treatment or other patient management decisions.  A negative result may occur with improper specimen collection / handling, submission of specimen other than nasopharyngeal swab, presence of viral mutation(s) within the areas targeted by this assay, and inadequate number of viral copies (<250 copies / mL). A negative result must be combined with clinical observations, patient history, and epidemiological information. Fact Sheet for Patients:   StrictlyIdeas.no Fact Sheet for Healthcare Providers: BankingDealers.co.za This test is not yet approved or cleared  by the [place] FDA and has been authorized for detection and/or diagnosis of SARS-CoV-2 by FDA under an Emergency Use Authorization (EUA).  This EUA will remain in effect (meaning this test can be used) for the duration of the COVID-19 declaration under Section 564(b)(1) of the Act, 21 U.S.C. section 360bbb-3(b)(1), unless the authorization is terminated or revoked sooner. Performed at [facility], [address] [place], [place] [address]   MRSA PCR Screening     Status: None   Collection Time: 08/02/19  8:03 AM   Specimen: Nasopharyngeal  Result Value Ref Range Status   MRSA by PCR NEGATIVE NEGATIVE Final    Comment:        The GeneXpert MRSA Assay (FDA approved for NASAL specimens only), is one component of a comprehensive MRSA colonization surveillance program. It is not intended to diagnose MRSA infection nor to guide or monitor treatment for MRSA infections. Performed at [facility], [address] [place], [place] [address]      Labs: BNP (last 3 results) No results for input(s):  BNP in the last 8760 hours. Basic Metabolic Panel: Recent Labs  Lab 08/01/19 1627 08/01/19 1627 08/02/19 0249 08/03/19 0415 08/04/19 0436 08/05/19 0537 08/06/19 0552  NA 141   < > 142 147* 151* 147* 147*  K 4.8   < > 4.6 3.4* 3.8 3.6 4.2  CL 121*   < > 124* 123* 127* 122* 122*  CO2 11*   < > 12* 19* 19* 19* 18*  GLUCOSE 116*   < > 150* 107* 97 119* 93  BUN 123*   < > 123* 84* 68* 57* 58*  CREATININE 7.32*   < > 7.01* 4.95* 4.36* 3.86* 3.63*  CALCIUM 9.2   < > 8.5* 8.4* 8.8* 8.9 9.1  MG 2.9*  --  3.0*  --   --   --   --   PHOS  --   --   --  4.0 3.9 3.8 3.8   < > = values in this interval not displayed.   Liver Function Tests: Recent Labs  Lab 08/03/19 0415 08/04/19 0436 08/05/19 0537 08/06/19 0552  ALBUMIN 3.3* 3.4* 3.4* 3.6   No results for input(s): LIPASE, AMYLASE in the last 168 hours. No results for input(s): AMMONIA in the last 168 hours. CBC: Recent Labs  Lab 08/01/19 1627 08/03/19 0415 08/04/19 0722 08/05/19 0537 08/06/19 0834  WBC 5.0 4.9 5.4 5.4 5.8  HGB 10.3* 8.6* 10.3* 9.6* 10.2*  HCT 31.4* 26.0* 30.2* 28.6* 30.6*  MCV 108.7* 104.8* 101.7* 102.1* 104.4*  PLT 130* 120* 131* 123* 141*   Cardiac Enzymes: No results for input(s):  CKTOTAL, CKMB, CKMBINDEX, TROPONINI in the last 168 hours. BNP: Invalid input(s): POCBNP CBG: Recent Labs  Lab 08/02/19 0759 08/02/19 1116 08/03/19 0729 08/03/19 1125  GLUCAP 84 172* 103* 137*   D-Dimer No results for input(s): DDIMER in the last 72 hours. Hgb A1c No results for input(s): HGBA1C in the last 72 hours. Lipid Profile No results for input(s): CHOL, HDL, LDLCALC, TRIG, CHOLHDL, LDLDIRECT in the last 72 hours. Thyroid function studies No results for input(s): TSH, T4TOTAL, T3FREE, THYROIDAB in the last 72 hours.  Invalid input(s): FREET3 Anemia work up No results for input(s): VITAMINB12, FOLATE, FERRITIN, TIBC, IRON, RETICCTPCT in the last 72 hours. Urinalysis    Component Value Date/Time    COLORURINE YELLOW (A) 08/01/2019 1824   APPEARANCEUR HAZY (A) 08/01/2019 1824   APPEARANCEUR Clear 04/24/2018 0835   LABSPEC 1.011 08/01/2019 1824   PHURINE 5.0 08/01/2019 1824   GLUCOSEU NEGATIVE 08/01/2019 1824   HGBUR NEGATIVE 08/01/2019 1824   BILIRUBINUR NEGATIVE 08/01/2019 1824   BILIRUBINUR Negative 04/24/2018 [place] 08/01/2019 1824   PROTEINUR 30 (A) 08/01/2019 1824   NITRITE NEGATIVE 08/01/2019 1824   LEUKOCYTESUR NEGATIVE 08/01/2019 1824   Sepsis Labs Invalid input(s): PROCALCITONIN,  WBC,  LACTICIDVEN Microbiology Recent Results (from the past 240 hour(s))  SARS Coronavirus 2 by RT PCR (hospital order, performed in [facility] hospital lab) Nasopharyngeal Nasopharyngeal Swab     Status: None   Collection Time: 08/01/19  6:24 PM   Specimen: Nasopharyngeal Swab  Result Value Ref Range Status   SARS Coronavirus 2 NEGATIVE NEGATIVE Final    Comment: (NOTE) SARS-CoV-2 target nucleic acids are NOT DETECTED. The SARS-CoV-2 RNA is generally detectable in upper and lower respiratory specimens during the acute phase of infection. The lowest concentration of SARS-CoV-2 viral copies this assay can detect is 250 copies / mL. A negative result does not preclude SARS-CoV-2 infection and should not be used as the sole basis for treatment or other patient management decisions.  A negative result may occur with improper specimen collection / handling, submission of specimen other than nasopharyngeal swab, presence of viral mutation(s) within the areas targeted by this assay, and inadequate number of viral copies (<250 copies / mL). A negative result must be combined with clinical observations, patient history, and epidemiological information. Fact Sheet for Patients:   StrictlyIdeas.no Fact Sheet for Healthcare Providers: BankingDealers.co.za This test is not yet approved or cleared  by the [place] FDA and has  been authorized for detection and/or diagnosis of SARS-CoV-2 by FDA under an Emergency Use Authorization (EUA).  This EUA will remain in effect (meaning this test can be used) for the duration of the COVID-19 declaration under Section 564(b)(1) of the Act, 21 U.S.C. section 360bbb-3(b)(1), unless the authorization is terminated or revoked sooner. Performed at [facility], [address] [place], [place] [address]   MRSA PCR Screening     Status: None   Collection Time: 08/02/19  8:03 AM   Specimen: Nasopharyngeal  Result Value Ref Range Status   MRSA by PCR NEGATIVE NEGATIVE Final    Comment:        The GeneXpert MRSA Assay (FDA approved for NASAL specimens only), is one component of a comprehensive MRSA colonization surveillance program. It is not intended to diagnose MRSA infection nor to guide or monitor treatment for MRSA infections. Performed at [facility], [address], [place], [place] [address]      Time coordinating discharge: 35  minutes The  [place] controlled substances registry was reviewed for this patient      SIGNED:   Edwin Dada, MD  [facility] 08/06/2019, 6:52 PM

## 2019-08-06 NOTE — TOC Initial Note (Signed)
Transition of Care (TOC) - Initial/Assessment Note    Patient Details  Name: Luis Miller. MRN: [id number] Date of Birth: 04-16-1945  Transition of Care [facility]) CM/SW Contact:    Eileen Stanford, LCSW Phone Number: 08/06/2019, 9:53 AM  Clinical Narrative:   Pt is alert and oriented. Pt lives alone. PT is recommending HH. Pt is agreeable. Pt has used [facility] in the past and would like to use them again. Pt states he has a walker at home. CSW will provide referral.                Expected Discharge Plan: [place] Barriers to Discharge: Continued Medical Work up   Patient Goals and CMS Choice Patient states their goals for this hospitalization and ongoing recovery are:: to go home   Choice offered to / list presented to : Patient  Expected Discharge Plan and Services Expected Discharge Plan: [place] In-house Referral: Clinical Social Work   Post Acute Care Choice: [place] arrangements for the past 2 months: [place] Expected Discharge Date: 08/06/19                         HH Arranged: OT, PT [place] Agency: [facility] (formerly [employer]) Date [place]: 08/06/19 [place]: [phone] Representative spoke with at [place]: teresa  Prior Living Arrangements/Services Living arrangements for the past 2 months: [place] with:: Self Patient language and need for interpreter reviewed:: Yes Do you feel safe going back to the place where you live?: Yes      Need for Family Participation in Patient Care: Yes (Comment) Care giver support system in place?: Yes (comment)   Criminal Activity/Legal Involvement Pertinent to Current Situation/Hospitalization: No - Comment as needed  Activities of Daily Living Home Assistive Devices/Equipment: None ADL Screening (condition at time of admission) Patient's cognitive ability adequate to safely complete daily activities?: Yes Is  the patient deaf or have difficulty hearing?: No Does the patient have difficulty seeing, even when wearing glasses/contacts?: No Does the patient have difficulty concentrating, remembering, or making decisions?: No Patient able to express need for assistance with ADLs?: Yes Does the patient have difficulty dressing or bathing?: No Independently performs ADLs?: Yes (appropriate for developmental age) Does the patient have difficulty walking or climbing stairs?: No Weakness of Legs: None Weakness of Arms/Hands: None  Permission Sought/Granted Permission sought to share information with : Family Supports                Emotional Assessment Appearance:: Appears stated age Attitude/Demeanor/Rapport: Engaged Affect (typically observed): Accepting, Appropriate Orientation: : Oriented to Self, Oriented to Place, Oriented to  Time, Oriented to Situation Alcohol / Substance Use: Not Applicable Psych Involvement: No (comment)  Admission diagnosis:  Bradycardia [R00.1] Acute renal failure, unspecified acute renal failure type ([place]) [N17.9] Acute renal failure superimposed on stage 4 chronic kidney disease (HCC) [N17.9, N18.4] Acute renal failure (ARF) (HCC) [N17.9] Patient Active Problem List   Diagnosis Date Noted  . 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 Lithium toxicity 08/05/2019  . Acute renal failure (ARF) ([place]) 08/02/2019  . Acute renal failure superimposed on stage 4 chronic kidney disease ([place]) 08/01/2019  . Insomnia disorder related to known organic factor 02/19/2019  . 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 Hemorrhoids, internal 12/12/2017  . Hx of adenomatous colonic polyps 12/05/2017  . Thrombosed external hemorrhoid 12/05/2017  .  Drug-induced tremor 05/08/2017  . 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 Secondary hyperparathyroidism of renal origin ([place]) 04/05/2017  . Advanced care planning/counseling discussion 04/05/2017  . Carotid bruit 04/05/2017  . Bradycardia  11/08/2016  . Gout 02/26/2016  . Erectile dysfunction due to arterial insufficiency 07/08/2015  . Obesity 11/27/2014  . CKD (chronic kidney disease) stage 4, GFR 15-29 ml/min (HCC) 11/27/2014  . Acute gout due to renal impairment involving right foot 11/22/2014  . 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  . Hypertension 09/09/2014  . Bipolar disorder, in full remission, most recent episode depressed ([place]) 09/09/2014  . Impotence 09/09/2014  . Lithium use 10/18/2013  . Abnormal thyroid function test 10/01/2013  . Hypertrophy of prostate with urinary obstruction and other lower urinary tract symptoms (LUTS) 01/13/2012  . Elevated prostate specific antigen (PSA) 01/13/2012  . Benign prostatic hyperplasia with urinary obstruction 01/13/2012  . ED (erectile dysfunction) of organic origin 01/13/2012  . Nocturia 01/13/2012  . 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 Peyronie's disease 01/13/2012  . Increased frequency of urination 01/13/2012  . Urinary hesitancy 01/13/2012   PCP:  Volney American, PA-C Pharmacy:   [id number] [id number]: [phone]: [phone] [facility] [address] [place]Phone: [phone]: [phone]    Social Determinants of Health (SDOH) Interventions    Readmission Risk Interventions No flowsheet data found.

## 2019-08-06 NOTE — Progress Notes (Signed)
Patient BP 176/78 after receiving AM dose of PO hydralazine 50 mg. NP [place] notified. No new orders at this time. Will continue to monitor.

## 2019-08-06 NOTE — Care Management Important Message (Signed)
Important Message  Patient Details  Name: Luis Miller. MRN: [id number] Date of Birth: Jul 21, 1945   Medicare Important Message Given:  No  Patient discharged prior to arrival to unit to deliver concurrent Medicare IM.     Dannette Barbara 08/06/2019, 12:36 PM

## 2019-08-06 NOTE — Plan of Care (Signed)
  Problem: Education: Goal: Understanding of CV disease, CV risk reduction, and recovery process will improve Outcome: Adequate for Discharge Goal: Individualized Educational Video(s) Outcome: Adequate for Discharge   Problem: Activity: Goal: Ability to return to baseline activity level will improve Outcome: Adequate for Discharge   Problem: Cardiovascular: Goal: Ability to achieve and maintain adequate cardiovascular perfusion will improve Outcome: Adequate for Discharge Goal: Vascular access site(s) Level 0-1 will be maintained Outcome: Adequate for Discharge   Problem: Health Behavior/Discharge Planning: Goal: Ability to safely manage health-related needs after discharge will improve Outcome: Adequate for Discharge   Problem: Education: Goal: Knowledge of General Education information will improve Description: Including pain rating scale, medication(s)/side effects and non-pharmacologic comfort measures Outcome: Adequate for Discharge   Problem: Nutrition: Goal: Adequate nutrition will be maintained Outcome: Adequate for Discharge   Problem: Elimination: Goal: Will not experience complications related to bowel motility Outcome: Adequate for Discharge Goal: Will not experience complications related to urinary retention Outcome: Adequate for Discharge   Problem: Safety: Goal: Ability to remain free from injury will improve Outcome: Adequate for Discharge   Problem: Skin Integrity: Goal: Risk for impaired skin integrity will decrease Outcome: Adequate for Discharge

## 2019-08-06 NOTE — Progress Notes (Signed)
[facility]  ROUNDING NOTE   Subjective:   Patient is concerned about not being on lithium at this time.   Objective:  Vital signs in last 24 hours:  Temp:  [97.6 F (36.4 C)-98.5 F (36.9 C)] 98.5 F (36.9 C) (05/17 0752) Pulse Rate:  [42-54] 45 (05/17 1001) Resp:  [18] 18 (05/17 0752) BP: (157-186)/(74-84) 159/74 (05/17 1001) SpO2:  [97 %-99 %] 99 % (05/17 0752) Weight:  [89 kg] 89 kg (05/17 0507)  Weight change: -0.227 kg Filed Weights   08/04/19 0148 08/05/19 0514 08/06/19 0507  Weight: 88 kg 89.3 kg 89 kg    Intake/Output: I/O last 3 completed shifts: In: 1854.3 [I.V.:1854.3] Out: 2440 [Urine:4650]   Intake/Output this shift:  Total I/O In: 240 [P.O.:240] Out: 300 [Urine:300]  Physical Exam: General: No acute distress  Head: Normocephalic, atraumatic. Moist oral mucosal membranes  Eyes: Anicteric  Neck: Supple, trachea midline  Lungs:  Clear to auscultation, normal effort  Heart: bradycardia  Abdomen:  Soft, nontender, bowel sounds present  Extremities: No edema.  Neurologic: Awake, alert, following commands  Skin: No lesions        Basic Metabolic Panel: Recent Labs  Lab 08/01/19 1627 08/01/19 1627 08/02/19 0249 08/02/19 0249 08/03/19 0415 08/03/19 0415 08/04/19 0436 08/05/19 0537 08/06/19 0552  NA 141   < > 142  --  147*  --  151* 147* 147*  K 4.8   < > 4.6  --  3.4*  --  3.8 3.6 4.2  CL 121*   < > 124*  --  123*  --  127* 122* 122*  CO2 11*   < > 12*  --  19*  --  19* 19* 18*  GLUCOSE 116*   < > 150*  --  107*  --  97 119* 93  BUN 123*   < > 123*  --  84*  --  68* 57* 58*  CREATININE 7.32*   < > 7.01*  --  4.95*  --  4.36* 3.86* 3.63*  CALCIUM 9.2   < > 8.5*   < > 8.4*   < > 8.8* 8.9 9.1  MG 2.9*  --  3.0*  --   --   --   --   --   --   PHOS  --   --   --   --  4.0  --  3.9 3.8 3.8   < > = values in this interval not displayed.    Liver Function Tests: Recent Labs  Lab 08/03/19 0415 08/04/19 0436 08/05/19 0537  08/06/19 0552  ALBUMIN 3.3* 3.4* 3.4* 3.6   No results for input(s): LIPASE, AMYLASE in the last 168 hours. No results for input(s): AMMONIA in the last 168 hours.  CBC: Recent Labs  Lab 08/01/19 1627 08/03/19 0415 08/04/19 0722 08/05/19 0537 08/06/19 0834  WBC 5.0 4.9 5.4 5.4 5.8  HGB 10.3* 8.6* 10.3* 9.6* 10.2*  HCT 31.4* 26.0* 30.2* 28.6* 30.6*  MCV 108.7* 104.8* 101.7* 102.1* 104.4*  PLT 130* 120* 131* 123* 141*    Cardiac Enzymes: No results for input(s): CKTOTAL, CKMB, CKMBINDEX, TROPONINI in the last 168 hours.  BNP: Invalid input(s): POCBNP  CBG: Recent Labs  Lab 08/02/19 0759 08/02/19 1116 08/03/19 0729 08/03/19 1125  GLUCAP 84 172* 103* 137*    Microbiology: Results for orders placed or performed during the hospital encounter of 08/01/19  SARS Coronavirus 2 by RT PCR (hospital order, performed in [facility] hospital lab) Nasopharyngeal  Nasopharyngeal Swab     Status: None   Collection Time: 08/01/19  6:24 PM   Specimen: Nasopharyngeal Swab  Result Value Ref Range Status   SARS Coronavirus 2 NEGATIVE NEGATIVE Final    Comment: (NOTE) SARS-CoV-2 target nucleic acids are NOT DETECTED. The SARS-CoV-2 RNA is generally detectable in upper and lower respiratory specimens during the acute phase of infection. The lowest concentration of SARS-CoV-2 viral copies this assay can detect is 250 copies / mL. A negative result does not preclude SARS-CoV-2 infection and should not be used as the sole basis for treatment or other patient management decisions.  A negative result may occur with improper specimen collection / handling, submission of specimen other than nasopharyngeal swab, presence of viral mutation(s) within the areas targeted by this assay, and inadequate number of viral copies (<250 copies / mL). A negative result must be combined with clinical observations, patient history, and epidemiological information. Fact Sheet for Patients:    StrictlyIdeas.no Fact Sheet for Healthcare Providers: BankingDealers.co.za This test is not yet approved or cleared  by the [place] FDA and has been authorized for detection and/or diagnosis of SARS-CoV-2 by FDA under an Emergency Use Authorization (EUA).  This EUA will remain in effect (meaning this test can be used) for the duration of the COVID-19 declaration under Section 564(b)(1) of the Act, 21 U.S.C. section 360bbb-3(b)(1), unless the authorization is terminated or revoked sooner. Performed at [facility], [address] [place], [place] [address]   MRSA PCR Screening     Status: None   Collection Time: 08/02/19  8:03 AM   Specimen: Nasopharyngeal  Result Value Ref Range Status   MRSA by PCR NEGATIVE NEGATIVE Final    Comment:        The GeneXpert MRSA Assay (FDA approved for NASAL specimens only), is one component of a comprehensive MRSA colonization surveillance program. It is not intended to diagnose MRSA infection nor to guide or monitor treatment for MRSA infections. Performed at [facility], [address] [place], [place] [address]     Coagulation Studies: No results for input(s): LABPROT, INR in the last 72 hours.  Urinalysis: No results for input(s): COLORURINE, LABSPEC, PHURINE, GLUCOSEU, HGBUR, BILIRUBINUR, KETONESUR, PROTEINUR, UROBILINOGEN, NITRITE, LEUKOCYTESUR in the last 72 hours.  Invalid input(s): APPERANCEUR    Imaging: No results found.   Medications:    . allopurinol  100 mg Oral Daily  . amLODipine  10 mg Oral Daily  . FLUoxetine  10 mg Oral Daily  . hydrALAZINE  100 mg Oral Q8H  . OLANZapine  2.5 mg Oral QHS  . pneumococcal 23 valent vaccine  0.5 mL Intramuscular Tomorrow-1000  . sodium chloride flush  3 mL Intravenous Q12H   acetaminophen **OR** acetaminophen, alteplase, heparin, lidocaine (PF), lidocaine-prilocaine, loperamide, ondansetron **OR**  ondansetron (ZOFRAN) IV, pentafluoroprop-tetrafluoroeth  Assessment/ Plan:    [name]. is a 74 y.o. 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  Acute kidney failure secondary to lithium toxicity.  Baseline creatinine 3.73 on 05/17/19.  Required one treatment of hemodialysis this admission No acute indication for dialysis  at this time. Nonoliguric urine output. Creatinine back to baseline Outpatient planning for peritoneal dialysis - discontinued lithium. Level is at goal at 0.7. Follow up with psych.   2. Hypernatremia: free water deficit of 2.7 liters.  - liberalized water intake  3. Bradycardia: chronic. Appreciate cardiology input.   4. Hypertension: Home regimen of amlodipine, amiloride, benazepril, hydralazine.  Recommend holding amiloride for now.   5. Anemia of chronic kidney disease: macrocytic. Hemoglobin 10.2.   Has not received ESA this admission.   6. Secondary Hyperparathyroidism: PTH low for level of kidney function at 105. Calcium and phosphorus at goal. No indication for vitamin D agents or binders.    7. Bipolar Disorder:   - quetiapine.  - discontinued lithium. Level is at goal at 0.7. Follow up with psych.    LOS: 4 Sarath Kolluru 5/17/202112:52 PM

## 2019-08-06 NOTE — Progress Notes (Signed)
Patient given discharge instructions. Dr. Loleta Books notified to call son with discharge instructions as well. Contact info given. Patient verbalized understanding without any questions or concerns. IV taken out and tele monitor off. Secretary attempted to make appointment for psych follow up multiple times without any success so patient was advised to try calling from home to make that appointment as soon as possible.

## 2019-08-07 DIAGNOSIS — G4700 Insomnia, unspecified: Secondary | ICD-10-CM | POA: Diagnosis not present

## 2019-08-07 DIAGNOSIS — M10371 Gout due to renal impairment, right ankle and foot: Secondary | ICD-10-CM | POA: Diagnosis not present

## 2019-08-07 DIAGNOSIS — N138 Other obstructive and reflux uropathy: Secondary | ICD-10-CM | POA: Diagnosis not present

## 2019-08-07 DIAGNOSIS — N401 Enlarged prostate with lower urinary tract symptoms: Secondary | ICD-10-CM | POA: Diagnosis not present

## 2019-08-07 DIAGNOSIS — E785 Hyperlipidemia, unspecified: Secondary | ICD-10-CM | POA: Diagnosis not present

## 2019-08-07 DIAGNOSIS — M19079 Primary osteoarthritis, unspecified ankle and foot: Secondary | ICD-10-CM | POA: Diagnosis not present

## 2019-08-07 DIAGNOSIS — R350 Frequency of micturition: Secondary | ICD-10-CM | POA: Diagnosis not present

## 2019-08-07 DIAGNOSIS — Z86010 Personal history of colonic polyps: Secondary | ICD-10-CM | POA: Diagnosis not present

## 2019-08-07 DIAGNOSIS — N184 Chronic kidney disease, stage 4 (severe): Secondary | ICD-10-CM | POA: Diagnosis not present

## 2019-08-07 DIAGNOSIS — Z85828 Personal history of other malignant neoplasm of skin: Secondary | ICD-10-CM | POA: Diagnosis not present

## 2019-08-07 DIAGNOSIS — N529 Male erectile dysfunction, unspecified: Secondary | ICD-10-CM | POA: Diagnosis not present

## 2019-08-07 DIAGNOSIS — E669 Obesity, unspecified: Secondary | ICD-10-CM | POA: Diagnosis not present

## 2019-08-07 DIAGNOSIS — I4891 Unspecified atrial fibrillation: Secondary | ICD-10-CM | POA: Diagnosis not present

## 2019-08-07 DIAGNOSIS — R351 Nocturia: Secondary | ICD-10-CM | POA: Diagnosis not present

## 2019-08-07 DIAGNOSIS — Z87891 Personal history of nicotine dependence: Secondary | ICD-10-CM | POA: Diagnosis not present

## 2019-08-07 DIAGNOSIS — D631 Anemia in chronic kidney disease: Secondary | ICD-10-CM | POA: Diagnosis not present

## 2019-08-07 DIAGNOSIS — R3911 Hesitancy of micturition: Secondary | ICD-10-CM | POA: Diagnosis not present

## 2019-08-07 DIAGNOSIS — K648 Other hemorrhoids: Secondary | ICD-10-CM | POA: Diagnosis not present

## 2019-08-07 DIAGNOSIS — K5090 Crohn's disease, unspecified, without complications: Secondary | ICD-10-CM | POA: Diagnosis not present

## 2019-08-07 DIAGNOSIS — F3176 Bipolar disorder, in full remission, most recent episode depressed: Secondary | ICD-10-CM | POA: Diagnosis not present

## 2019-08-07 DIAGNOSIS — K589 Irritable bowel syndrome without diarrhea: Secondary | ICD-10-CM | POA: Diagnosis not present

## 2019-08-07 DIAGNOSIS — I129 Hypertensive chronic kidney disease with stage 1 through stage 4 chronic kidney disease, or unspecified chronic kidney disease: Secondary | ICD-10-CM | POA: Diagnosis not present

## 2019-08-08 ENCOUNTER — Telehealth: Payer: Self-pay

## 2019-08-08 ENCOUNTER — Other Ambulatory Visit: Payer: Self-pay

## 2019-08-08 ENCOUNTER — Telehealth (INDEPENDENT_AMBULATORY_CARE_PROVIDER_SITE_OTHER): Payer: Medicare Other | Admitting: Psychiatry

## 2019-08-08 ENCOUNTER — Encounter: Payer: Self-pay | Admitting: Psychiatry

## 2019-08-08 DIAGNOSIS — F3176 Bipolar disorder, in full remission, most recent episode depressed: Secondary | ICD-10-CM | POA: Diagnosis not present

## 2019-08-08 MED ORDER — FLUOXETINE HCL 10 MG PO CAPS: 10.0000 mg | ORAL_CAPSULE | Freq: Every day | ORAL | 1 refills | Status: DC

## 2019-08-08 NOTE — Progress Notes (Signed)
[place] MD OP Progress Note  Virtual Visit via Telephone Note  I connected with Luis Coss. on 08/08/19 at  9:30 AM EDT by telephone and verified that I am speaking with the correct person using two identifiers.  Location: Patient: home Provider: Clinic   I discussed the limitations, risks, security and privacy concerns of performing an evaluation and management service by telephone and the availability of in person appointments. I also discussed with the patient that there may be a patient responsible charge related to this service. The patient expressed understanding and agreed to proceed.   I provided 13 minutes of non-face-to-face time during this encounter.   08/08/2019 9:42 AM Luis Coss.  MRN:  [id number]  Chief Complaint:  " I am doing better now."  HPI: Patient was recently hospitalized for lithium toxicity due to acute on chronic renal failure at [facility] hospital. He received one treatment of hemodialysis and that helped improve his renal function. He was discharged with recommendation of avoiding lithium in the further. Pt has chronic kidney disease and was maintaining on low dose lithium 300 mg HS for past few years. He was also closely being monitored by his nephrologist Dr. Holley Raring.   He informed that e few weeks ago he started to feel uneasy and when he went to his PCP his PCP advised him to go to nearest ER. He was evaluated in the ED on 5/13 and was hospitalized. He was discharged on 5/17. He was continued on olanzapine 2.5 mg HS. Prozac 10 mg was added to his regimen.  Today, pt reported he is doing much better now. He feels his physical functioning is improving gradually and he is hoping to reach his baseline soon.  Psychiatrically, he feels his mood is stable. He did not want to get off the Lithium in the past as he was convinced that if he gets off of it he will have a relapse and will end up back in the psychiatry in-pt unit. He now understands that lithium can  be detrimental to his physical health due to CKD.  He stated that he likes the combination of Olanzapine and Prozac. He denied any side effects to it. He is sleeping well at night and does not need Trazodone on a regular basis.  He requested his Prozac prescription is sent to [facility]. He informed he has plenty of Olanzapine bottles at home.  He also requested that he is given in person appointments in the [facility] clinic location for future follow up care. He does not want to drive to [facility] to see the Probation officer in office.    Visit Diagnosis:    ICD-10-CM   1. Bipolar disorder, in full remission, most recent episode depressed ([place])  F31.76     Past Psychiatric History: Bipolar disorder  Past Medical History:  Past Medical History:  Diagnosis Date  . Anemia   . Apnea, sleep   . Arthritis   . Bipolar affective ([place])   . Cancer ([place]) malignant melanoma  . CRI (chronic renal insufficiency)   . Crohn's disease ([place])   . Depression   . Diabetes insipidus ([place])   . Erectile dysfunction   . Gout   . Hyperlipidemia   . Hypertension   . IBS (irritable bowel syndrome)   . Impotence   . Internal hemorrhoids   . 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[place])   . Paraphimosis   . Peyronie disease   . Secondary hyperparathyroidism of renal origin (  [place])   . Skin cancer   . Testicular hypofunction   . Urinary frequency   . Urinary hesitancy     Past Surgical History:  Procedure Laterality Date  . arm fracture    . BONE MARROW BIOPSY    . COLONOSCOPY WITH PROPOFOL N/A 12/23/2014   Procedure: COLONOSCOPY WITH PROPOFOL;  Surgeon: Manya Silvas, MD;  Location: [facility] ENDOSCOPY;  Service: Endoscopy;  Laterality: N/A;  . COLONOSCOPY WITH PROPOFOL N/A 02/06/2018   Procedure: COLONOSCOPY WITH PROPOFOL;  Surgeon: Manya Silvas, MD;  Location: [facility] ENDOSCOPY;  Service: Endoscopy;  Laterality: N/A;  . KNEE ARTHROPLASTY Left 02/22/2018   Procedure: COMPUTER ASSISTED  TOTAL KNEE ARTHROPLASTY;  Surgeon: Dereck Leep, MD;  Location: [facility] ORS;  Service: Orthopedics;  Laterality: Left;  Marland Kitchen MELANOMA EXCISION    . PENILE PROSTHESIS IMPLANT    . PENILE PROSTHESIS IMPLANT N/A 08/25/2015   Procedure: REPLACEMENT OF INFLATABLE PENILE PROSTHESIS COMPONENTS;  Surgeon: Cleon Gustin, MD;  Location: [facility] ORS;  Service: Urology;  Laterality: N/A;  . SKIN CANCER EXCISION     nose  . TONSILLECTOMY    . VASECTOMY      Family Psychiatric History: see below  Family History:  Family History  Problem Relation Age of Onset  . Depression Sister   . Bipolar disorder Other   . Prostate cancer Neg Hx   . Kidney cancer Neg Hx   . Bladder Cancer Neg Hx     Social History:  Social History   Socioeconomic History  . Marital status: Divorced    Spouse name: Not on file  . Number of children: 2  . Years of education: Not on file  . Highest education level: High school graduate  Occupational History  . Occupation: retired  Tobacco Use  . Smoking status: Former Smoker    Types: Cigarettes    Quit date: 09/08/1972    Years since quitting: 46.9  . Smokeless tobacco: Never Used  Substance and Sexual Activity  . Alcohol use: Not Currently    Alcohol/week: 0.0 standard drinks    Comment: Occasional glass of wine  . Drug use: No  . Sexual activity: Yes    Partners: Female    Birth control/protection: None  Other Topics Concern  . Not on file  Social History Narrative  . Not on file   Social Determinants of Health   Financial Resource Strain:   . Difficulty of Paying Living Expenses:   Food Insecurity:   . Worried About Charity fundraiser in the Last Year:   . Arboriculturist in the Last Year:   Transportation Needs:   . Film/video editor (Medical):   Marland Kitchen Lack of Transportation (Non-Medical):   Physical Activity: Inactive  . Days of Exercise per Week: 0 days  . Minutes of Exercise per Session: 0 min  Stress:   . Feeling of Stress :   Social  Connections:   . Frequency of Communication with Friends and Family:   . Frequency of Social Gatherings with Friends and Family:   . Attends Religious Services:   . Active Member of Clubs or Organizations:   . Attends Archivist Meetings:   Marland Kitchen Marital Status:     Allergies:  Allergies  Allergen Reactions  . Mirtazapine Rash  . Aspirin     Other reaction(s): Unknown Patient was instructed not to take aspirin but can not remember why  . Atomoxetine Other (See Comments)    Urinary  sx  . Strattera [Atomoxetine Hcl] Other (See Comments)    Urinary sx  . Xyzal [Levocetirizine Dihydrochloride] Rash    Patient reported on 2/40/97    Metabolic Disorder Labs: No results found for: HGBA1C, MPG No results found for: PROLACTIN Lab Results  Component Value Date   CHOL 202 (H) 04/26/2019   TRIG 176 (H) 04/26/2019   HDL 41 04/26/2019   CHOLHDL 3.9 04/24/2018   VLDL 50 (H) 10/03/2017   LDLCALC 130 (H) 04/26/2019   LDLCALC 96 04/24/2018   Lab Results  Component Value Date   TSH 1.226 08/01/2019   TSH 1.060 04/26/2019    Therapeutic Level Labs: Lab Results  Component Value Date   LITHIUM 0.70 08/06/2019   LITHIUM 0.78 08/05/2019   No results found for: VALPROATE No components found for:  CBMZ  Current Medications: Current Outpatient Medications  Medication Sig Dispense Refill  . allopurinol (ZYLOPRIM) 100 MG tablet Take 200 mg by mouth daily.     Marland Kitchen amLODipine (NORVASC) 10 MG tablet Take 1 tablet (10 mg total) by mouth daily. 90 tablet 1  . benazepril (LOTENSIN) 40 MG tablet Take 40 mg by mouth daily.     . colchicine 0.6 MG tablet Take 0.5 tablets (0.3 mg total) by mouth daily as needed. 5 tablet 0  . cyproheptadine (PERIACTIN) 4 MG tablet Take one tablet three hours prior to intercourse (Patient taking differently: Take 4 mg by mouth daily as needed. (take 3 hours before intercourse)) 90 tablet 3  . FLUoxetine (PROZAC) 10 MG capsule Take 1 capsule (10 mg total) by  mouth daily. 30 capsule 3  . hydrALAZINE (APRESOLINE) 100 MG tablet Take 100 mg by mouth 3 (three) times daily.    Marland Kitchen loperamide (IMODIUM) 2 MG capsule Take 2 mg by mouth as needed for diarrhea or loose stools.    . Multiple Vitamin (MULTIVITAMIN) tablet Take 1 tablet by mouth daily.    Marland Kitchen OLANZapine (ZYPREXA) 2.5 MG tablet Take 1 tablet (2.5 mg total) by mouth at bedtime. 30 tablet 3  . traZODone (DESYREL) 50 MG tablet Take 1 tablet (50 mg total) by mouth at bedtime as needed. (Patient taking differently: Take 50 mg by mouth at bedtime as needed for sleep. ) 90 tablet 1  . Vitamin D, Cholecalciferol, 25 MCG (1000 UT) TABS Take 1,000 tablets by mouth daily.      No current facility-administered medications for this visit.      Psychiatric Specialty Exam: ROS  There were no vitals taken for this visit.There is no height or weight on file to calculate BMI.  General Appearance: unable to assess due to phone visit  Eye Contact:  Unable to assess due to phone visit  Speech:  Clear and Coherent and Normal Rate  Volume:  Normal  Mood:  Worried about seroquel  Affect:  Congruent  Thought Process:  Goal Directed, Linear and Descriptions of Associations: Intact  Orientation:  Full (Time, Place, and Person)  Thought Content: Logical, Rumination and fixated on seroquel   Suicidal Thoughts:  No  Homicidal Thoughts:  No  Memory:  Recent;   Good Remote;   Good  Judgement:  Fair  Insight:  Fair  Psychomotor Activity:  Normal  Concentration:  Concentration: Good and Attention Span: Fair  Recall:  Good  Fund of Knowledge: Good  Language: Good  Akathisia:  Negative  Handed:  Right  AIMS (if indicated): not done due to phone visit  Assets:  Communication Skills Desire for Improvement Financial  Resources/Insurance  ADL's:  Intact  Cognition: WNL  Sleep:  Good   Screenings: Mini-Mental     Office Visit from 02/23/2017 in [facility]  Total Score (max 30 points )  28    PHQ2-9      Clinical Support from 11/20/2018 in [place] Visit from 04/24/2018 in [place] from 11/14/2017 in [place] Visit from 04/05/2017 in [place] Visit from 02/23/2017 in [place]  PHQ-2 Total Score  0  0  0  0  0  PHQ-9 Total Score  --  3  --  --  --       Assessment and Plan: 74 year old male with hx of CKD who had been on low dose lithium for many years, was recently hospitalized for lithium toxicity and acute on chronic renal failure. He was discharged with one treatment of hemodialysis. He was also started on prozac 10 mg recently. He is continuing olanzapine 2.5 mg HS and Prozac 10 mg HS and find the combination helpful.  1. Bipolar disorder, in full remission, most recent episode depressed ([facility])  - Continue FLUoxetine (PROZAC) 10 MG capsule; Take 1 capsule (10 mg total) by mouth daily.  Dispense: 90 capsule; Refill: 1 - Continue Olanzapine 2.5 mg HS.  Pt requested that he is given in person appointments in the [facility] clinic location for future follow up care. He does not want to drive to [facility] to see the Probation officer in office. Pt is scheduled to see his nephrologist next week. F/up in 2 months with psychiatry. Pt was advised to call sooner if the need arises.  [place] Crane, MD 08/08/2019, 9:42 AM

## 2019-08-08 NOTE — Telephone Encounter (Signed)
Patient discharged from hospital on 08/06/2019, per discharge summary pt to follow up with his specialist. D/c summary did not mention follow up needed with PCP. Called patient to see if he would like a hospital follow up with PCP as it is still recommended after discharge from hospital. No answer/ no vm set up. Will attempt at a later time.

## 2019-08-09 ENCOUNTER — Encounter: Payer: Self-pay | Admitting: Family Medicine

## 2019-08-09 ENCOUNTER — Ambulatory Visit (INDEPENDENT_AMBULATORY_CARE_PROVIDER_SITE_OTHER): Payer: Medicare Other | Admitting: Family Medicine

## 2019-08-09 ENCOUNTER — Other Ambulatory Visit: Payer: Self-pay

## 2019-08-09 VITALS — BP 128/70 | HR 47 | Temp 97.9°F | Wt 194.0 lb

## 2019-08-09 DIAGNOSIS — M19079 Primary osteoarthritis, unspecified ankle and foot: Secondary | ICD-10-CM | POA: Diagnosis not present

## 2019-08-09 DIAGNOSIS — I10 Essential (primary) hypertension: Secondary | ICD-10-CM | POA: Diagnosis not present

## 2019-08-09 DIAGNOSIS — N184 Chronic kidney disease, stage 4 (severe): Secondary | ICD-10-CM

## 2019-08-09 DIAGNOSIS — N170 Acute kidney failure with tubular necrosis: Secondary | ICD-10-CM

## 2019-08-09 DIAGNOSIS — I129 Hypertensive chronic kidney disease with stage 1 through stage 4 chronic kidney disease, or unspecified chronic kidney disease: Secondary | ICD-10-CM | POA: Diagnosis not present

## 2019-08-09 DIAGNOSIS — R001 Bradycardia, unspecified: Secondary | ICD-10-CM | POA: Diagnosis not present

## 2019-08-09 DIAGNOSIS — T56891S Toxic effect of other metals, accidental (unintentional), sequela: Secondary | ICD-10-CM

## 2019-08-09 DIAGNOSIS — M10371 Gout due to renal impairment, right ankle and foot: Secondary | ICD-10-CM | POA: Diagnosis not present

## 2019-08-09 DIAGNOSIS — I4891 Unspecified atrial fibrillation: Secondary | ICD-10-CM | POA: Diagnosis not present

## 2019-08-09 DIAGNOSIS — F3176 Bipolar disorder, in full remission, most recent episode depressed: Secondary | ICD-10-CM | POA: Diagnosis not present

## 2019-08-09 DIAGNOSIS — D631 Anemia in chronic kidney disease: Secondary | ICD-10-CM | POA: Diagnosis not present

## 2019-08-09 NOTE — Progress Notes (Signed)
BP 128/70   Pulse (!) 47   Temp 97.9 F (36.6 C) (Oral)   Wt 194 lb (88 kg)   SpO2 98%   BMI 29.50 kg/m    Subjective:    Patient ID: Luis Coss., male    DOB: [birth year], 74 y.o.   MRN: [id number]  HPI: Luis Wease. is a 74 y.o. male  Chief Complaint  Patient presents with  . Hospitalization Follow-up   Presenting today for hospital f/u for bradycardia, lithium toxicity due to acute on chronic renal failure, bipolar disorder. Feeling much better, was dialyzed during admission with good relief of many of his sxs. Lithium discontinued, he is now on zyprexa, prozac, and trazodone at bedtime and so far doing well on this regimen. Has outpatient Psychiatrist he's following closely with on these changes. Sees Nephrology as outpatient on Monday, with plans to recheck labs and make future plan on possible dialysis needs. Denies any concerns at this time, just feeling a bit weak still from admission.   Transition of Care Hospital Follow up.   Hospital/Facility: [facility] D/C Physician: Dr. Loleta Books D/C Date: 08/06/19  Records Requested: available Records Received: immediately Records Reviewed: 08/09/19  Diagnoses on Discharge: acute on chronic renal failure, HTN, bipolar disorder, bradycardia, lithium toxicity  Date of interactive Contact within 48 hours of discharge:  Contact was through: phone  Date of 7 day or 14 day face-to-face visit:    within 7 days  Outpatient Encounter Medications as of 08/09/2019  Medication Sig Note  . allopurinol (ZYLOPRIM) 100 MG tablet Take 200 mg by mouth daily.    Marland Kitchen amLODipine (NORVASC) 10 MG tablet Take 1 tablet (10 mg total) by mouth daily.   . benazepril (LOTENSIN) 40 MG tablet Take 40 mg by mouth daily.    . colchicine 0.6 MG tablet Take 0.5 tablets (0.3 mg total) by mouth daily as needed.   Marland Kitchen FLUoxetine (PROZAC) 10 MG capsule Take 1 capsule (10 mg total) by mouth daily.   . hydrALAZINE (APRESOLINE) 100 MG tablet Take 100 mg by mouth 3  (three) times daily.   Marland Kitchen loperamide (IMODIUM) 2 MG capsule Take 2 mg by mouth as needed for diarrhea or loose stools.   . Multiple Vitamin (MULTIVITAMIN) tablet Take 1 tablet by mouth daily.   Marland Kitchen OLANZapine (ZYPREXA) 2.5 MG tablet Take 1 tablet (2.5 mg total) by mouth at bedtime.   . traZODone (DESYREL) 50 MG tablet Take 1 tablet (50 mg total) by mouth at bedtime as needed. (Patient taking differently: Take 50 mg by mouth at bedtime as needed for sleep. )   . Vitamin D, Cholecalciferol, 25 MCG (1000 UT) TABS Take 1,000 tablets by mouth daily.    . cyproheptadine (PERIACTIN) 4 MG tablet Take one tablet three hours prior to intercourse (Patient not taking: Reported on 08/09/2019) 08/09/2019: As needed   No facility-administered encounter medications on file as of 08/09/2019.    Diagnostic Tests Reviewed/Disposition: Labs, CXR, u/s renal  Consults: Nephrology, Psychiatry, Cardiology  Discharge Instructions: Psychiatric med adjustments, outpt f/u  Disease/illness Education: given  Home Health/Community Services Discussions/Referrals: n/a  Establishment or re-establishment of referral orders for community resources: n/a  Discussion with other health care providers: n/a  Assessment and Support of treatment regimen adherence: excellent  Appointments Coordinated with: has outpt f/u with Nephrology and Psychiatry scheduled  Education for self-management, independent living, and ADLs: given  Relevant past medical, surgical, family and social history reviewed and updated as indicated. Interim medical  history since our last visit reviewed. Allergies and medications reviewed and updated.  Review of Systems  Per HPI unless specifically indicated above     Objective:    BP 128/70   Pulse (!) 47   Temp 97.9 F (36.6 C) (Oral)   Wt 194 lb (88 kg)   SpO2 98%   BMI 29.50 kg/m   Wt Readings from Last 3 Encounters:  08/09/19 194 lb (88 kg)  08/06/19 196 lb 4.8 oz (89 kg)  06/28/19 205 lb  (93 kg)    Physical Exam Vitals and nursing note reviewed.  Constitutional:      Appearance: Normal appearance.  HENT:     Head: Atraumatic.  Eyes:     Extraocular Movements: Extraocular movements intact.     Conjunctiva/sclera: Conjunctivae normal.  Cardiovascular:     Rate and Rhythm: Bradycardia present.  Pulmonary:     Effort: Pulmonary effort is normal.     Breath sounds: Normal breath sounds.  Musculoskeletal:        General: Normal range of motion.     Cervical back: Normal range of motion and neck supple.  Skin:    General: Skin is warm and dry.  Neurological:     General: No focal deficit present.     Mental Status: He is oriented to person, place, and time.  Psychiatric:        Mood and Affect: Mood normal.        Thought Content: Thought content normal.        Judgment: Judgment normal.     Results for orders placed or performed during the hospital encounter of 08/01/19  SARS Coronavirus 2 by RT PCR (hospital order, performed in [facility] hospital lab) Nasopharyngeal Nasopharyngeal Swab   Specimen: Nasopharyngeal Swab  Result Value Ref Range   SARS Coronavirus 2 NEGATIVE NEGATIVE  MRSA PCR Screening   Specimen: Nasopharyngeal  Result Value Ref Range   MRSA by PCR NEGATIVE NEGATIVE  Basic metabolic panel  Result Value Ref Range   Sodium 141 135 - 145 mmol/L   Potassium 4.8 3.5 - 5.1 mmol/L   Chloride 121 (H) 98 - 111 mmol/L   CO2 11 (L) 22 - 32 mmol/L   Glucose, Bld 116 (H) 70 - 99 mg/dL   BUN 123 (H) 8 - 23 mg/dL   Creatinine, Ser 7.32 (H) 0.61 - 1.24 mg/dL   Calcium 9.2 8.9 - 10.3 mg/dL   GFR calc non Af Amer 7 (L) >60 mL/min   GFR calc Af Amer 8 (L) >60 mL/min   Anion gap 9 5 - 15  CBC  Result Value Ref Range   WBC 5.0 4.0 - 10.5 K/uL   RBC 2.89 (L) 4.22 - 5.81 MIL/uL   Hemoglobin 10.3 (L) 13.0 - 17.0 g/dL   HCT 31.4 (L) 39.0 - 52.0 %   MCV 108.7 (H) 80.0 - 100.0 fL   MCH 35.6 (H) 26.0 - 34.0 pg   MCHC 32.8 30.0 - 36.0 g/dL   RDW 13.8 11.5  - 15.5 %   Platelets 130 (L) 150 - 400 K/uL   nRBC 0.0 0.0 - 0.2 %  Urinalysis, Complete w Microscopic  Result Value Ref Range   Color, Urine YELLOW (A) YELLOW   APPearance HAZY (A) CLEAR   Specific Gravity, Urine 1.011 1.005 - 1.030   pH 5.0 5.0 - 8.0   Glucose, UA NEGATIVE NEGATIVE mg/dL   Hgb urine dipstick NEGATIVE NEGATIVE   Bilirubin Urine NEGATIVE NEGATIVE  Ketones, ur NEGATIVE NEGATIVE mg/dL   Protein, ur 30 (A) NEGATIVE mg/dL   Nitrite NEGATIVE NEGATIVE   Leukocytes,Ua NEGATIVE NEGATIVE   RBC / HPF 0-5 0 - 5 RBC/hpf   WBC, UA 0-5 0 - 5 WBC/hpf   Bacteria, UA NONE SEEN NONE SEEN   Squamous Epithelial / LPF NONE SEEN 0 - 5  Lithium level  Result Value Ref Range   Lithium Lvl 1.67 (HH) 0.60 - 1.20 mmol/L  TSH  Result Value Ref Range   TSH 1.226 0.350 - 4.500 uIU/mL  Magnesium  Result Value Ref Range   Magnesium 2.9 (H) 1.7 - 2.4 mg/dL  Sodium, urine, random  Result Value Ref Range   Sodium, Ur 37 mmol/L  Creatinine, urine, random  Result Value Ref Range   Creatinine, Urine 194 mg/dL  Basic metabolic panel  Result Value Ref Range   Sodium 142 135 - 145 mmol/L   Potassium 4.6 3.5 - 5.1 mmol/L   Chloride 124 (H) 98 - 111 mmol/L   CO2 12 (L) 22 - 32 mmol/L   Glucose, Bld 150 (H) 70 - 99 mg/dL   BUN 123 (H) 8 - 23 mg/dL   Creatinine, Ser 7.01 (H) 0.61 - 1.24 mg/dL   Calcium 8.5 (L) 8.9 - 10.3 mg/dL   GFR calc non Af Amer 7 (L) >60 mL/min   GFR calc Af Amer 8 (L) >60 mL/min   Anion gap 6 5 - 15  Magnesium  Result Value Ref Range   Magnesium 3.0 (H) 1.7 - 2.4 mg/dL  Lithium level  Result Value Ref Range   Lithium Lvl 1.66 (HH) 0.60 - 1.20 mmol/L  Glucose, capillary  Result Value Ref Range   Glucose-Capillary 84 70 - 99 mg/dL  Glucose, capillary  Result Value Ref Range   Glucose-Capillary 172 (H) 70 - 99 mg/dL  Hepatitis B surface antigen  Result Value Ref Range   Hepatitis B Surface Ag NON REACTIVE NON REACTIVE  Hepatitis B surface antibody  Result  Value Ref Range   Hep B S Ab NON REACTIVE NON REACTIVE  Hepatitis B core antibody, total  Result Value Ref Range   Hep B Core Total Ab NON REACTIVE NON REACTIVE  Hepatitis C antibody  Result Value Ref Range   HCV Ab NON REACTIVE NON REACTIVE  CBC  Result Value Ref Range   WBC 4.9 4.0 - 10.5 K/uL   RBC 2.48 (L) 4.22 - 5.81 MIL/uL   Hemoglobin 8.6 (L) 13.0 - 17.0 g/dL   HCT 26.0 (L) 39.0 - 52.0 %   MCV 104.8 (H) 80.0 - 100.0 fL   MCH 34.7 (H) 26.0 - 34.0 pg   MCHC 33.1 30.0 - 36.0 g/dL   RDW 13.3 11.5 - 15.5 %   Platelets 120 (L) 150 - 400 K/uL   nRBC 0.0 0.0 - 0.2 %  Renal function panel  Result Value Ref Range   Sodium 147 (H) 135 - 145 mmol/L   Potassium 3.4 (L) 3.5 - 5.1 mmol/L   Chloride 123 (H) 98 - 111 mmol/L   CO2 19 (L) 22 - 32 mmol/L   Glucose, Bld 107 (H) 70 - 99 mg/dL   BUN 84 (H) 8 - 23 mg/dL   Creatinine, Ser 4.95 (H) 0.61 - 1.24 mg/dL   Calcium 8.4 (L) 8.9 - 10.3 mg/dL   Phosphorus 4.0 2.5 - 4.6 mg/dL   Albumin 3.3 (L) 3.5 - 5.0 g/dL   GFR calc non Af Amer 11 (L) >60  mL/min   GFR calc Af Amer 12 (L) >60 mL/min   Anion gap 5 5 - 15  Lithium level  Result Value Ref Range   Lithium Lvl 1.07 0.60 - 1.20 mmol/L  Glucose, capillary  Result Value Ref Range   Glucose-Capillary 103 (H) 70 - 99 mg/dL   Comment 1 Notify RN    Comment 2 Document in Chart   Glucose, capillary  Result Value Ref Range   Glucose-Capillary 137 (H) 70 - 99 mg/dL   Comment 1 Notify RN    Comment 2 Document in Chart   Renal function panel  Result Value Ref Range   Sodium 151 (H) 135 - 145 mmol/L   Potassium 3.8 3.5 - 5.1 mmol/L   Chloride 127 (H) 98 - 111 mmol/L   CO2 19 (L) 22 - 32 mmol/L   Glucose, Bld 97 70 - 99 mg/dL   BUN 68 (H) 8 - 23 mg/dL   Creatinine, Ser 4.36 (H) 0.61 - 1.24 mg/dL   Calcium 8.8 (L) 8.9 - 10.3 mg/dL   Phosphorus 3.9 2.5 - 4.6 mg/dL   Albumin 3.4 (L) 3.5 - 5.0 g/dL   GFR calc non Af Amer 12 (L) >60 mL/min   GFR calc Af Amer 14 (L) >60 mL/min   Anion gap  5 5 - 15  CBC  Result Value Ref Range   WBC 5.4 4.0 - 10.5 K/uL   RBC 2.97 (L) 4.22 - 5.81 MIL/uL   Hemoglobin 10.3 (L) 13.0 - 17.0 g/dL   HCT 30.2 (L) 39.0 - 52.0 %   MCV 101.7 (H) 80.0 - 100.0 fL   MCH 34.7 (H) 26.0 - 34.0 pg   MCHC 34.1 30.0 - 36.0 g/dL   RDW 13.5 11.5 - 15.5 %   Platelets 131 (L) 150 - 400 K/uL   nRBC 0.0 0.0 - 0.2 %  Renal function panel  Result Value Ref Range   Sodium 147 (H) 135 - 145 mmol/L   Potassium 3.6 3.5 - 5.1 mmol/L   Chloride 122 (H) 98 - 111 mmol/L   CO2 19 (L) 22 - 32 mmol/L   Glucose, Bld 119 (H) 70 - 99 mg/dL   BUN 57 (H) 8 - 23 mg/dL   Creatinine, Ser 3.86 (H) 0.61 - 1.24 mg/dL   Calcium 8.9 8.9 - 10.3 mg/dL   Phosphorus 3.8 2.5 - 4.6 mg/dL   Albumin 3.4 (L) 3.5 - 5.0 g/dL   GFR calc non Af Amer 14 (L) >60 mL/min   GFR calc Af Amer 17 (L) >60 mL/min   Anion gap 6 5 - 15  CBC  Result Value Ref Range   WBC 5.4 4.0 - 10.5 K/uL   RBC 2.80 (L) 4.22 - 5.81 MIL/uL   Hemoglobin 9.6 (L) 13.0 - 17.0 g/dL   HCT 28.6 (L) 39.0 - 52.0 %   MCV 102.1 (H) 80.0 - 100.0 fL   MCH 34.3 (H) 26.0 - 34.0 pg   MCHC 33.6 30.0 - 36.0 g/dL   RDW 13.4 11.5 - 15.5 %   Platelets 123 (L) 150 - 400 K/uL   nRBC 0.0 0.0 - 0.2 %  Lithium level  Result Value Ref Range   Lithium Lvl 0.78 0.60 - 1.20 mmol/L  Renal function panel  Result Value Ref Range   Sodium 147 (H) 135 - 145 mmol/L   Potassium 4.2 3.5 - 5.1 mmol/L   Chloride 122 (H) 98 - 111 mmol/L   CO2 18 (L) 22 -  32 mmol/L   Glucose, Bld 93 70 - 99 mg/dL   BUN 58 (H) 8 - 23 mg/dL   Creatinine, Ser 3.63 (H) 0.61 - 1.24 mg/dL   Calcium 9.1 8.9 - 10.3 mg/dL   Phosphorus 3.8 2.5 - 4.6 mg/dL   Albumin 3.6 3.5 - 5.0 g/dL   GFR calc non Af Amer 16 (L) >60 mL/min   GFR calc Af Amer 18 (L) >60 mL/min   Anion gap 7 5 - 15  Lithium level  Result Value Ref Range   Lithium Lvl 0.70 0.60 - 1.20 mmol/L  CBC  Result Value Ref Range   WBC 5.8 4.0 - 10.5 K/uL   RBC 2.93 (L) 4.22 - 5.81 MIL/uL   Hemoglobin 10.2  (L) 13.0 - 17.0 g/dL   HCT 30.6 (L) 39.0 - 52.0 %   MCV 104.4 (H) 80.0 - 100.0 fL   MCH 34.8 (H) 26.0 - 34.0 pg   MCHC 33.3 30.0 - 36.0 g/dL   RDW 13.6 11.5 - 15.5 %   Platelets 141 (L) 150 - 400 K/uL   nRBC 0.0 0.0 - 0.2 %  ECHOCARDIOGRAM COMPLETE  Result Value Ref Range   Weight 3,241.64 oz   Height 68 in   BP 109/94 mmHg      Assessment & Plan:   Problem List Items Addressed This Visit      Cardiovascular and Mediastinum   Hypertension (Chronic)    BPs stable and WNL, continue current regimen with close monitoring        Genitourinary   Acute renal failure superimposed on stage 4 chronic kidney disease ([place])    Improved after inpatient dialysis, will hold off on lab recheck today as he's following up with Nephrologist Monday for labs and recheck. Continue current regimen per their recommendation         Other   Bipolar disorder, in full remission, most recent episode depressed ([place]) - Primary    Lithium discontinued during admission, prozac initiated with zyprexa. So far tolerating well, continue current regimen per Psychiatry with close f/u      Bradycardia    Improved and back near his baseline, asymptomatic. Continue to monitor closely on current regimen      Lithium toxicity    Resolved with inpatient dialysis, lithium d/c'd. Follow up with Psychiatry and Nephrology as outpatient          Follow up plan: Return for as scheduled.

## 2019-08-10 DIAGNOSIS — N184 Chronic kidney disease, stage 4 (severe): Secondary | ICD-10-CM | POA: Diagnosis not present

## 2019-08-10 DIAGNOSIS — D631 Anemia in chronic kidney disease: Secondary | ICD-10-CM | POA: Diagnosis not present

## 2019-08-10 DIAGNOSIS — I129 Hypertensive chronic kidney disease with stage 1 through stage 4 chronic kidney disease, or unspecified chronic kidney disease: Secondary | ICD-10-CM | POA: Diagnosis not present

## 2019-08-10 DIAGNOSIS — I4891 Unspecified atrial fibrillation: Secondary | ICD-10-CM | POA: Diagnosis not present

## 2019-08-10 DIAGNOSIS — M10371 Gout due to renal impairment, right ankle and foot: Secondary | ICD-10-CM | POA: Diagnosis not present

## 2019-08-10 DIAGNOSIS — M19079 Primary osteoarthritis, unspecified ankle and foot: Secondary | ICD-10-CM | POA: Diagnosis not present

## 2019-08-12 NOTE — Assessment & Plan Note (Signed)
Improved and back near his baseline, asymptomatic. Continue to monitor closely on current regimen

## 2019-08-12 NOTE — Assessment & Plan Note (Signed)
Resolved with inpatient dialysis, lithium d/c'd. Follow up with Psychiatry and Nephrology as outpatient

## 2019-08-12 NOTE — Assessment & Plan Note (Signed)
Improved after inpatient dialysis, will hold off on lab recheck today as he's following up with Nephrologist Monday for labs and recheck. Continue current regimen per their recommendation

## 2019-08-12 NOTE — Assessment & Plan Note (Signed)
Lithium discontinued during admission, prozac initiated with zyprexa. So far tolerating well, continue current regimen per Psychiatry with close f/u

## 2019-08-12 NOTE — Assessment & Plan Note (Signed)
BPs stable and WNL, continue current regimen with close monitoring

## 2019-08-13 DIAGNOSIS — N2581 Secondary hyperparathyroidism of renal origin: Secondary | ICD-10-CM | POA: Diagnosis not present

## 2019-08-13 DIAGNOSIS — N184 Chronic kidney disease, stage 4 (severe): Secondary | ICD-10-CM | POA: Diagnosis not present

## 2019-08-13 DIAGNOSIS — N179 Acute kidney failure, unspecified: Secondary | ICD-10-CM | POA: Diagnosis not present

## 2019-08-13 DIAGNOSIS — D631 Anemia in chronic kidney disease: Secondary | ICD-10-CM | POA: Diagnosis not present

## 2019-08-13 DIAGNOSIS — I10 Essential (primary) hypertension: Secondary | ICD-10-CM | POA: Diagnosis not present

## 2019-08-13 NOTE — Assessment & Plan Note (Signed)
Creatinine already above 5 on recent check with Nephrologist, and now s/p 2 weeks of severe diarrhea and poor PO intake additionally. Needs stat labs, fluids and possibly dialysis - urged pt to go to ER, he will call someone to come take him immediately

## 2019-08-13 NOTE — Assessment & Plan Note (Signed)
Significant bradycardia noted today, around 30-40 range on several checks with machine and manual reading as well as on EKG today. Otherwise EKG looks stable without acute ST or T wave abnormalities, evidence of obvious atrial fib or other issues. Concern for lithium toxicity and possible other causes for issue at current time. Pt will call someone to take him to the ER from clinic for immediate workup and treatment

## 2019-08-13 NOTE — Assessment & Plan Note (Signed)
Concern for lithium toxicity due to acute renal failure given sxs. Will have someone come pick him up and take him to the ER for immediate labs, imaging and tx of current sxs given significantly concerning nature of presentation.

## 2019-08-14 DIAGNOSIS — N184 Chronic kidney disease, stage 4 (severe): Secondary | ICD-10-CM | POA: Diagnosis not present

## 2019-08-14 DIAGNOSIS — D631 Anemia in chronic kidney disease: Secondary | ICD-10-CM | POA: Diagnosis not present

## 2019-08-14 DIAGNOSIS — M19079 Primary osteoarthritis, unspecified ankle and foot: Secondary | ICD-10-CM | POA: Diagnosis not present

## 2019-08-14 DIAGNOSIS — I4891 Unspecified atrial fibrillation: Secondary | ICD-10-CM | POA: Diagnosis not present

## 2019-08-14 DIAGNOSIS — I129 Hypertensive chronic kidney disease with stage 1 through stage 4 chronic kidney disease, or unspecified chronic kidney disease: Secondary | ICD-10-CM | POA: Diagnosis not present

## 2019-08-14 DIAGNOSIS — M10371 Gout due to renal impairment, right ankle and foot: Secondary | ICD-10-CM | POA: Diagnosis not present

## 2019-08-16 DIAGNOSIS — M10371 Gout due to renal impairment, right ankle and foot: Secondary | ICD-10-CM | POA: Diagnosis not present

## 2019-08-16 DIAGNOSIS — N184 Chronic kidney disease, stage 4 (severe): Secondary | ICD-10-CM | POA: Diagnosis not present

## 2019-08-16 DIAGNOSIS — I129 Hypertensive chronic kidney disease with stage 1 through stage 4 chronic kidney disease, or unspecified chronic kidney disease: Secondary | ICD-10-CM | POA: Diagnosis not present

## 2019-08-16 DIAGNOSIS — D631 Anemia in chronic kidney disease: Secondary | ICD-10-CM | POA: Diagnosis not present

## 2019-08-16 DIAGNOSIS — M19079 Primary osteoarthritis, unspecified ankle and foot: Secondary | ICD-10-CM | POA: Diagnosis not present

## 2019-08-16 DIAGNOSIS — I4891 Unspecified atrial fibrillation: Secondary | ICD-10-CM | POA: Diagnosis not present

## 2019-08-21 DIAGNOSIS — I129 Hypertensive chronic kidney disease with stage 1 through stage 4 chronic kidney disease, or unspecified chronic kidney disease: Secondary | ICD-10-CM | POA: Diagnosis not present

## 2019-08-21 DIAGNOSIS — D631 Anemia in chronic kidney disease: Secondary | ICD-10-CM | POA: Diagnosis not present

## 2019-08-21 DIAGNOSIS — N184 Chronic kidney disease, stage 4 (severe): Secondary | ICD-10-CM | POA: Diagnosis not present

## 2019-08-21 DIAGNOSIS — M19079 Primary osteoarthritis, unspecified ankle and foot: Secondary | ICD-10-CM | POA: Diagnosis not present

## 2019-08-21 DIAGNOSIS — M10371 Gout due to renal impairment, right ankle and foot: Secondary | ICD-10-CM | POA: Diagnosis not present

## 2019-08-21 DIAGNOSIS — I4891 Unspecified atrial fibrillation: Secondary | ICD-10-CM | POA: Diagnosis not present

## 2019-08-23 DIAGNOSIS — M19079 Primary osteoarthritis, unspecified ankle and foot: Secondary | ICD-10-CM | POA: Diagnosis not present

## 2019-08-23 DIAGNOSIS — D631 Anemia in chronic kidney disease: Secondary | ICD-10-CM | POA: Diagnosis not present

## 2019-08-23 DIAGNOSIS — N184 Chronic kidney disease, stage 4 (severe): Secondary | ICD-10-CM | POA: Diagnosis not present

## 2019-08-23 DIAGNOSIS — I4891 Unspecified atrial fibrillation: Secondary | ICD-10-CM | POA: Diagnosis not present

## 2019-08-23 DIAGNOSIS — I129 Hypertensive chronic kidney disease with stage 1 through stage 4 chronic kidney disease, or unspecified chronic kidney disease: Secondary | ICD-10-CM | POA: Diagnosis not present

## 2019-08-23 DIAGNOSIS — N179 Acute kidney failure, unspecified: Secondary | ICD-10-CM | POA: Diagnosis not present

## 2019-08-23 DIAGNOSIS — M10371 Gout due to renal impairment, right ankle and foot: Secondary | ICD-10-CM | POA: Diagnosis not present

## 2019-08-28 DIAGNOSIS — M10371 Gout due to renal impairment, right ankle and foot: Secondary | ICD-10-CM | POA: Diagnosis not present

## 2019-08-28 DIAGNOSIS — I129 Hypertensive chronic kidney disease with stage 1 through stage 4 chronic kidney disease, or unspecified chronic kidney disease: Secondary | ICD-10-CM | POA: Diagnosis not present

## 2019-08-28 DIAGNOSIS — D631 Anemia in chronic kidney disease: Secondary | ICD-10-CM | POA: Diagnosis not present

## 2019-08-28 DIAGNOSIS — M19079 Primary osteoarthritis, unspecified ankle and foot: Secondary | ICD-10-CM | POA: Diagnosis not present

## 2019-08-28 DIAGNOSIS — N184 Chronic kidney disease, stage 4 (severe): Secondary | ICD-10-CM | POA: Diagnosis not present

## 2019-08-28 DIAGNOSIS — I4891 Unspecified atrial fibrillation: Secondary | ICD-10-CM | POA: Diagnosis not present

## 2019-08-30 DIAGNOSIS — N184 Chronic kidney disease, stage 4 (severe): Secondary | ICD-10-CM | POA: Diagnosis not present

## 2019-09-04 DIAGNOSIS — D631 Anemia in chronic kidney disease: Secondary | ICD-10-CM | POA: Diagnosis not present

## 2019-09-04 DIAGNOSIS — I4891 Unspecified atrial fibrillation: Secondary | ICD-10-CM | POA: Diagnosis not present

## 2019-09-04 DIAGNOSIS — M19079 Primary osteoarthritis, unspecified ankle and foot: Secondary | ICD-10-CM | POA: Diagnosis not present

## 2019-09-04 DIAGNOSIS — N184 Chronic kidney disease, stage 4 (severe): Secondary | ICD-10-CM | POA: Diagnosis not present

## 2019-09-04 DIAGNOSIS — M10371 Gout due to renal impairment, right ankle and foot: Secondary | ICD-10-CM | POA: Diagnosis not present

## 2019-09-04 DIAGNOSIS — I129 Hypertensive chronic kidney disease with stage 1 through stage 4 chronic kidney disease, or unspecified chronic kidney disease: Secondary | ICD-10-CM | POA: Diagnosis not present

## 2019-09-05 DIAGNOSIS — N2581 Secondary hyperparathyroidism of renal origin: Secondary | ICD-10-CM | POA: Diagnosis not present

## 2019-09-05 DIAGNOSIS — D631 Anemia in chronic kidney disease: Secondary | ICD-10-CM | POA: Diagnosis not present

## 2019-09-05 DIAGNOSIS — I10 Essential (primary) hypertension: Secondary | ICD-10-CM | POA: Diagnosis not present

## 2019-09-05 DIAGNOSIS — R809 Proteinuria, unspecified: Secondary | ICD-10-CM | POA: Diagnosis not present

## 2019-09-05 DIAGNOSIS — N184 Chronic kidney disease, stage 4 (severe): Secondary | ICD-10-CM | POA: Diagnosis not present

## 2019-09-06 DIAGNOSIS — L905 Scar conditions and fibrosis of skin: Secondary | ICD-10-CM | POA: Diagnosis not present

## 2019-09-06 DIAGNOSIS — D2261 Melanocytic nevi of right upper limb, including shoulder: Secondary | ICD-10-CM | POA: Diagnosis not present

## 2019-09-06 DIAGNOSIS — N184 Chronic kidney disease, stage 4 (severe): Secondary | ICD-10-CM | POA: Diagnosis not present

## 2019-09-06 DIAGNOSIS — D2262 Melanocytic nevi of left upper limb, including shoulder: Secondary | ICD-10-CM | POA: Diagnosis not present

## 2019-09-06 DIAGNOSIS — Z85820 Personal history of malignant melanoma of skin: Secondary | ICD-10-CM | POA: Diagnosis not present

## 2019-09-06 DIAGNOSIS — L821 Other seborrheic keratosis: Secondary | ICD-10-CM | POA: Diagnosis not present

## 2019-09-06 DIAGNOSIS — D692 Other nonthrombocytopenic purpura: Secondary | ICD-10-CM | POA: Diagnosis not present

## 2019-09-06 DIAGNOSIS — D485 Neoplasm of uncertain behavior of skin: Secondary | ICD-10-CM | POA: Diagnosis not present

## 2019-09-06 DIAGNOSIS — D225 Melanocytic nevi of trunk: Secondary | ICD-10-CM | POA: Diagnosis not present

## 2019-09-14 ENCOUNTER — Telehealth (HOSPITAL_COMMUNITY): Payer: Self-pay | Admitting: *Deleted

## 2019-09-14 NOTE — Telephone Encounter (Signed)
PATIENT CALLED WITH A QUESTION /CONCERN THAT DURING HOSPITALIZATION 2 OF THE MED'S PRESCRIBED BY YOU WERE STOPPED > LITHIUM & SEROQUEL. HE'S NOW  EXPERIENCING  WAKING UP  Q 2 HRS WHERE AS HE WAS PREVIOUSLY WAKING UP Q 4 HRS. PATIENT ISN'T REQUESTING TO GO BACK ON THE D/C'D MED'S. LOOKING FOR A ANSWER FROM YOU  IF THIS IS A SIDE EFFECT OF THE NEWLY PRESCRIBED MED'S??

## 2019-09-14 NOTE — Telephone Encounter (Signed)
I called and spoke with the patient.  He stated that he has been taking Prozac, olanzapine and trazodone at bedtime and lately has noticed that he is not sleeping as well as he used to.  He informed that he is waking up several times during the night.  Writer advised him to try taking the Prozac in the morning instead of bedtime as it can be activating in nature and can impact sleep.  He was advised to continue taking olanzapine and trazodone at bedtime.  Patient was agreeable to this suggestion and stated that he would try doing the same.

## 2019-09-20 ENCOUNTER — Other Ambulatory Visit: Payer: Medicare Other

## 2019-09-25 ENCOUNTER — Telehealth (HOSPITAL_COMMUNITY): Payer: Self-pay | Admitting: *Deleted

## 2019-09-25 ENCOUNTER — Other Ambulatory Visit: Payer: Medicare Other

## 2019-09-25 ENCOUNTER — Other Ambulatory Visit: Payer: Self-pay | Admitting: Family Medicine

## 2019-09-25 DIAGNOSIS — R972 Elevated prostate specific antigen [PSA]: Secondary | ICD-10-CM

## 2019-09-25 NOTE — Telephone Encounter (Signed)
Called him back after he left a voice mail fri for writer stating he was continuing to have difficulty sleeping, he is waking up q 2 hours at night. He called re this concern about 10 days ago. Reviewed with him he followed the directions given to him at that time and he has. He has in the past taken Melatonin and he suggested he restart it and Probation officer agreed it was worth revisiting. Also, Probation officer noted he does not have a follow up appt scheduled, Dr Malachy Moan note from May indicates as he does today he does not want to come to [facility] for his appts and would prefer to stay in [place] as he lives in [place], much closer to [place]. Reached out the [place] office to have them schedule him. Waiting on them to provide an appt.

## 2019-09-26 ENCOUNTER — Encounter: Payer: Self-pay | Admitting: Urology

## 2019-09-26 ENCOUNTER — Other Ambulatory Visit: Payer: Medicare Other

## 2019-09-26 ENCOUNTER — Other Ambulatory Visit: Payer: Self-pay

## 2019-09-26 DIAGNOSIS — R972 Elevated prostate specific antigen [PSA]: Secondary | ICD-10-CM

## 2019-09-26 NOTE — Telephone Encounter (Signed)
No further documentation required. Issue resolved for now.

## 2019-09-27 ENCOUNTER — Ambulatory Visit: Payer: Medicare Other | Admitting: Urology

## 2019-09-27 LAB — PSA: Prostate Specific Ag, Serum: 4.6 ng/mL — ABNORMAL HIGH (ref 0.0–4.0)

## 2019-10-02 ENCOUNTER — Encounter: Payer: Self-pay | Admitting: Urology

## 2019-10-02 ENCOUNTER — Other Ambulatory Visit: Payer: Self-pay

## 2019-10-02 ENCOUNTER — Ambulatory Visit (INDEPENDENT_AMBULATORY_CARE_PROVIDER_SITE_OTHER): Payer: Medicare Other | Admitting: Urology

## 2019-10-02 VITALS — BP 115/64 | HR 61 | Ht 67.0 in | Wt 185.0 lb

## 2019-10-02 DIAGNOSIS — R972 Elevated prostate specific antigen [PSA]: Secondary | ICD-10-CM

## 2019-10-02 DIAGNOSIS — N138 Other obstructive and reflux uropathy: Secondary | ICD-10-CM

## 2019-10-02 DIAGNOSIS — N401 Enlarged prostate with lower urinary tract symptoms: Secondary | ICD-10-CM

## 2019-10-02 DIAGNOSIS — R310 Gross hematuria: Secondary | ICD-10-CM | POA: Diagnosis not present

## 2019-10-02 DIAGNOSIS — R351 Nocturia: Secondary | ICD-10-CM | POA: Diagnosis not present

## 2019-10-02 DIAGNOSIS — N529 Male erectile dysfunction, unspecified: Secondary | ICD-10-CM | POA: Diagnosis not present

## 2019-10-02 LAB — BLADDER SCAN AMB NON-IMAGING: Scan Result: 7

## 2019-10-02 NOTE — Progress Notes (Addendum)
03/30/2019 1:16 PM   Luis Miller. 1946-03-10 [id number]  Referring provider: Volney American, PA-C [address] [place],  [place] [address]  Chief Complaint  Patient presents with  . Elevated PSA    HPI: Mr. Hidrogo is a 74 year old male with an elevated PSA, ED and BPH with LU TS who presents today for a 6 month follow up.  Elevated PSA Patient had a negative bx in 2002 for a PSA of 4.03 ng/mL.  His current PSA is 4.6 ng/mL in 09/26/2019       Erectile dysfunction IPP placement for a malfunction IPP on 08/25/2015.  He states that the prosthesis is harder to pump and the cylinders are not inflating to achieve a firm erections.    BPH WITH LUTS His IPSS score today is 10, which is moderate lower urinary tract symptomatology.  He is mostly dissatisfied with his quality life due to his urinary symptoms.  His PVR is 7 mL.  His major complaints are nocturia.  He has had a recent change in his psychiatric meds and that is when the nocturia worsened.  His previous I PSS score was 7/3.Marland Kitchen  He denies any dysuria, hematuria or suprapubic pain.  He also denies any recent fevers, chills, nausea or vomiting.  He is adopted.     IPSS    Row Name 10/02/19 1000         International Prostate Symptom Score   How often have you had the sensation of not emptying your bladder? Not at All     How often have you had to urinate less than every two hours? More than half the time     How often have you found you stopped and started again several times when you urinated? Not at All     How often have you found it difficult to postpone urination? Less than half the time     How often have you had a weak urinary stream? Not at All     How often have you had to strain to start urination? Not at All     How many times did you typically get up at night to urinate? 4 Times     Total IPSS Score 10       Quality of Life due to urinary symptoms   If you were to spend the rest of your life with your  urinary condition just the way it is now how would you feel about that? Mostly Disatisfied            Score:  1-7 Mild 8-19 Moderate 20-35 Severe  He states that about two months ago, he was masturbating and experienced gross hematuria.  This happened on two occasions.  He is not able to ejaculate, so he cannot tell if there was blood in his semen.  There was no pain with the urination.    PMH: Past Medical History:  Diagnosis Date  . Anemia   . Apnea, sleep   . Arthritis   . Bipolar affective ([place])   . Cancer ([place]) malignant melanoma  . CRI (chronic renal insufficiency)   . Crohn's disease ([place])   . Depression   . Diabetes insipidus ([place])   . Erectile dysfunction   . Gout   . Hyperlipidemia   . Hypertension   . IBS (irritable bowel syndrome)   . Impotence   . Internal hemorrhoids   . 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[place])   . Paraphimosis   .  Peyronie disease   . Secondary hyperparathyroidism of renal origin ([place])   . Skin cancer   . Testicular hypofunction   . Urinary frequency   . Urinary hesitancy     Surgical History: Past Surgical History:  Procedure Laterality Date  . arm fracture    . BONE MARROW BIOPSY    . COLONOSCOPY WITH PROPOFOL N/A 12/23/2014   Procedure: COLONOSCOPY WITH PROPOFOL;  Surgeon: Manya Silvas, MD;  Location: [facility] ENDOSCOPY;  Service: Endoscopy;  Laterality: N/A;  . COLONOSCOPY WITH PROPOFOL N/A 02/06/2018   Procedure: COLONOSCOPY WITH PROPOFOL;  Surgeon: Manya Silvas, MD;  Location: [facility] ENDOSCOPY;  Service: Endoscopy;  Laterality: N/A;  . KNEE ARTHROPLASTY Left 02/22/2018   Procedure: COMPUTER ASSISTED TOTAL KNEE ARTHROPLASTY;  Surgeon: Dereck Leep, MD;  Location: [facility] ORS;  Service: Orthopedics;  Laterality: Left;  Marland Kitchen MELANOMA EXCISION    . PENILE PROSTHESIS IMPLANT    . PENILE PROSTHESIS IMPLANT N/A 08/25/2015   Procedure: REPLACEMENT OF INFLATABLE PENILE PROSTHESIS COMPONENTS;  Surgeon: Cleon Gustin, MD;  Location: [facility]  ORS;  Service: Urology;  Laterality: N/A;  . SKIN CANCER EXCISION     nose  . TONSILLECTOMY    . VASECTOMY      Home Medications:  Allergies as of 10/02/2019      Reactions   Mirtazapine Rash   Aspirin    Other reaction(s): Unknown Patient was instructed not to take aspirin but can not remember why   Atomoxetine Other (See Comments)   Urinary sx   Strattera [atomoxetine Hcl] Other (See Comments)   Urinary sx   Xyzal [levocetirizine Dihydrochloride] Rash   Patient reported on 11/02/17      Medication List       Accurate as of October 02, 2019  1:16 PM. If you have any questions, ask your nurse or doctor.        allopurinol 100 MG tablet Commonly known as: ZYLOPRIM Take 200 mg by mouth daily.   amLODipine 10 MG tablet Commonly known as: NORVASC Take 1 tablet (10 mg total) by mouth daily.   benazepril 40 MG tablet Commonly known as: LOTENSIN Take 40 mg by mouth daily.   colchicine 0.6 MG tablet Take 0.5 tablets (0.3 mg total) by mouth daily as needed.   cyproheptadine 4 MG tablet Commonly known as: PERIACTIN Take one tablet three hours prior to intercourse   FLUoxetine 10 MG capsule Commonly known as: PROZAC Take 1 capsule (10 mg total) by mouth daily.   hydrALAZINE 100 MG tablet Commonly known as: APRESOLINE Take 100 mg by mouth 3 (three) times daily.   loperamide 2 MG capsule Commonly known as: IMODIUM Take 2 mg by mouth as needed for diarrhea or loose stools.   multivitamin tablet Take 1 tablet by mouth daily.   OLANZapine 2.5 MG tablet Commonly known as: ZYPREXA Take 1 tablet (2.5 mg total) by mouth at bedtime.   traZODone 50 MG tablet Commonly known as: DESYREL Take 1 tablet (50 mg total) by mouth at bedtime as needed. What changed: reasons to take this   Vitamin D (Cholecalciferol) 25 MCG (1000 UT) Tabs Take 1,000 tablets by mouth daily.       Allergies:  Allergies  Allergen Reactions  . Mirtazapine Rash  . Aspirin     Other  reaction(s): Unknown Patient was instructed not to take aspirin but can not remember why  . Atomoxetine Other (See Comments)    Urinary sx  . Strattera [Atomoxetine Hcl] Other (See  Comments)    Urinary sx  . Xyzal [Levocetirizine Dihydrochloride] Rash    Patient reported on 11/02/17    Family History: Family History  Problem Relation Age of Onset  . Depression Sister   . Bipolar disorder Other   . Prostate cancer Neg Hx   . Kidney cancer Neg Hx   . Bladder Cancer Neg Hx     Social History:  reports that he quit smoking about 47 years ago. His smoking use included cigarettes. He has never used smokeless tobacco. He reports previous alcohol use. He reports that he does not use drugs.  ROS: For pertinent review of systems please refer to history of present illness  Physical Exam: BP 115/64   Pulse 61   Ht _0  (1.702 m)   Wt 185 lb (83.9 kg)   BMI 28.98 kg/m   Constitutional:  Well nourished. Alert and oriented, No acute distress. HEENT: [place] AT, mask in place.  Trachea midline. Constitutional:  Well nourished. Alert and oriented, No acute distress. Respiratory: Normal respiratory effort, no increased work of breathing. GI: Abdomen is soft, non tender, non distended, no abdominal masses. Liver and spleen not palpable.  No hernias appreciated.  Stool sample for occult testing is not indicated.   GU: No CVA tenderness.  No bladder fullness or masses.  Patient with uncircumcised Foreskin easily retracted.  Urethral meatus is patent.  No penile discharge. No penile lesions or rashes. The cylinders in place, the glans is floppy. I cycled the prosthesis and noted that is difficult to pump the cylinders and the cylinders did not fully inflate Scrotum without lesions, cysts, rashes and/or edema.  Testicles are located scrotally bilaterally. No masses are appreciated in the testicles. Left and right epididymis are normal. The pump is located in the scrotum Rectal: Patient with  normal  sphincter tone. Anus and perineum without scarring or rashes. No rectal masses are appreciated. Prostate is approximately 60 grams, could only palpate the mid portion and apex of the gland no nodules are appreciated. Seminal vesicles could not be palpated  Skin: No rashes, bruises or suspicious lesions. Lymph: No inguinal adenopathy. Neurologic: Grossly intact, no focal deficits, moving all 4 extremities. Psychiatric: Normal mood and affect.   Laboratory Data: Lab Results  Component Value Date   WBC 5.8 08/06/2019   HGB 10.2 (L) 08/06/2019   HCT 30.6 (L) 08/06/2019   MCV 104.4 (H) 08/06/2019   PLT 141 (L) 08/06/2019    Lab Results  Component Value Date   CREATININE 3.63 (H) 08/06/2019    Lab Results  Component Value Date   TSH 1.226 08/01/2019       Component Value Date/Time   CHOL 202 (H) 04/26/2019 1101   CHOL 180 10/03/2017 1101   HDL 41 04/26/2019 1101   CHOLHDL 3.9 04/24/2018 0837   VLDL 50 (H) 10/03/2017 1101   LDLCALC 130 (H) 04/26/2019 1101    Lab Results  Component Value Date   AST 19 04/26/2019   Lab Results  Component Value Date   ALT 20 04/26/2019   PSA History  5.6 ng/mL on 11/27/2013  4.9 ng/mL on 05/28/2014   Component     Latest Ref Rng & Units 09/09/2014 06/03/2015 11/11/2015 05/31/2016  Prostate Specific Ag, Serum     0.0 - 4.0 ng/mL 5.7 (H) 5.5 (H) 6.4 (H) 6.4 (H)   Component     Latest Ref Rng & Units 12/06/2016 04/05/2017 06/13/2017 12/20/2017  Prostate Specific Ag, Serum  0.0 - 4.0 ng/mL 5.6 (H) 7.1 (H) 6.4 (H) 5.7 (H)   Component     Latest Ref Rng & Units 06/20/2018 09/26/2018 03/27/2019 09/26/2019  Prostate Specific Ag, Serum     0.0 - 4.0 ng/mL 6.6 (H) 5.8 (H) 5.9 (H) 4.6 (H)   I have reviewed the labs.  Pertinent Imaging Results for LESLEE, HAUETER (MRN [id number]) as of 10/02/2019 13:20  Ref. Range 10/02/2019 11:27  Scan Result Unknown 7   Assessment & Plan:    1. Gross hematuria Explained to the patient that the blood in the  urine may be the result of retrograde hematospermia, but we cannot be certain as GU malignancies can also present this way The AUA guidelines state that a CT urogram is the preferred imaging study to evaluate for high risk hematuria, but we need to resort to a non-contrast study due to his poor renal function.  I explained that the non contrast study could potentially miss a cancer, but he has had a recent RUS that did not identify a malignancy.   Following the imaging study,  I've recommended a cystoscopy. I described how this is performed, typically in an office setting with a flexible cystoscope. We described the risks, benefits, and possible side effects, the most common of which is a minor amount of blood in the urine and/or burning which usually resolves in 24 to 48 hours.   The patient had the opportunity to ask questions which were answered. Based upon this discussion, the patient is willing to proceed. Therefore, I've ordered: a non-contrast CT and cystoscopy. The patient will return following all of the above for discussion of the results.   2. Elevated PSA PSA currently is 4.6 - age specific PSA 20 to 75 years of age the range is 0 to 6.5 RTC in 6 months for PSA and exam   3. Erectile dysfunction Penile prosthesis is becoming more difficult to inflate Offered referral to Dr. Francesca Jewett at [facility] and the patient is not  interested to undergo more surgery at this time RTC in 6 months for exam  4. BPH with LUTS IPSS score is 10/4 it is worse Continue conservative management, avoiding bladder irritants and timed voiding's RTC in 6 months for IPSS, PSA and exam   5. Nocturia Patient does have sleep apnea and is sleeping with CPAP machine nightly  Patient will try restricting fluids in the evening Patient working with his psychiatrist to adjust his medication   Return for CT report and cystoscopy with Dr. Bernardo Heater for gross hematuria .  These notes generated with voice recognition  software. I apologize for typographical errors.  [address] [place], [place] [address] [phone]  I, Joneen Boers Peace, am acting as a Education administrator for [employer].  I have reviewed the above documentation for accuracy and completeness, and I agree with the above.    Zara Council, PA-C

## 2019-10-08 ENCOUNTER — Telehealth (HOSPITAL_COMMUNITY): Payer: Self-pay | Admitting: *Deleted

## 2019-10-08 MED ORDER — QUETIAPINE FUMARATE 25 MG PO TABS: 25.0000 mg | ORAL_TABLET | Freq: Every day | ORAL | 1 refills | Status: DC

## 2019-10-08 NOTE — Telephone Encounter (Signed)
Called and spoke with the patient who reported that he is waking up frequently during the night even when he has tried a combination of olanzapine and trazodone.  He also has started taking melatonin but even that does not help much. Patient repeatedly stated that he was very frustrated because of this.  He stated that he used to do very well with quetiapine.  Writer reminded him that in November 2020 he had informed the Probation officer that he could not take quetiapine anymore due to insurance coverage issues.  Patient then recalled that and then stated that he would rather go back to paying extra for quetiapine rather than staying up during the night. Writer then recommended that he discontinue the olanzapine and melatonin and he can resume quetiapine 25 mg at bedtime.  Patient stated that he still has a bottle left and he will start taking that one. Regarding his next visit, patient once again complained that he cannot come all the way to [facility] to see the Probation officer.  And that he really wants to see the doctor in person.  Writer informed him that Probation officer had spoken to the staff in [place] office regarding this and at that point the patient stated that he was informed that there is no appointment available till September for a face-to-face session.  He is scheduled to see the writer again on August 10.  He reluctantly agreed to talk to the writer on the phone and made him mentally denied that he will not come to [facility] to see the Probation officer.   Plan: Ms. Janett Billow- I will see him on August 10 via phone visit.  Can you please schedule him with Dr. Shea Evans for a face-to-face session in the next 3 months. Ms. Ron Agee am sending a prescription for quetiapine 25 mg to mail order pharmacy as per patient request.

## 2019-10-08 NOTE — Addendum Note (Signed)
Addended by: [place] Crane on: 10/08/2019 03:08 PM   Modules accepted: Orders

## 2019-10-08 NOTE — Telephone Encounter (Signed)
Called wanting to speak with Dr Toy Care. He states she told him to do something and its not working and he is hoping she has another idea. He did not want to speak with Probation officer. His call back is [phone].

## 2019-10-30 ENCOUNTER — Other Ambulatory Visit: Payer: Self-pay

## 2019-10-30 ENCOUNTER — Encounter (HOSPITAL_COMMUNITY): Payer: Self-pay | Admitting: Psychiatry

## 2019-10-30 ENCOUNTER — Telehealth (INDEPENDENT_AMBULATORY_CARE_PROVIDER_SITE_OTHER): Payer: Medicare Other | Admitting: Psychiatry

## 2019-10-30 DIAGNOSIS — F3176 Bipolar disorder, in full remission, most recent episode depressed: Secondary | ICD-10-CM

## 2019-10-30 DIAGNOSIS — G4700 Insomnia, unspecified: Secondary | ICD-10-CM

## 2019-10-30 MED ORDER — FLUOXETINE HCL 10 MG PO CAPS: 10.0000 mg | ORAL_CAPSULE | Freq: Every day | ORAL | 1 refills | Status: DC

## 2019-10-30 MED ORDER — TRAZODONE HCL 50 MG PO TABS: 50.0000 mg | ORAL_TABLET | Freq: Every evening | ORAL | 0 refills | Status: DC | PRN

## 2019-10-30 NOTE — Progress Notes (Signed)
[facility] MD OP Progress Note  Virtual Visit via Telephone Note  I connected with Luis Coss. on 10/30/19 at  3:00 PM EDT by telephone and verified that I am speaking with the correct person using two identifiers.  Location: Patient: home Provider: Clinic   I discussed the limitations, risks, security and privacy concerns of performing an evaluation and management service by telephone and the availability of in person appointments. I also discussed with the patient that there may be a patient responsible charge related to this service. The patient expressed understanding and agreed to proceed.   I provided 14 minutes of non-face-to-face time during this encounter.   10/30/2019 3:36 PM Luis Coss.  MRN:  [id number]  Chief Complaint:  " I am sleeping better now."  HPI: Patient had called to speak to the writer a few weeks ago and had reported that he was not sleeping as well as he used to with olanzapine.  He expressed a lot of frustration about that.  At that point writer had suggested if he wanted to go back to Seroquel 25 mg at bedtime as that seemed to help him better with sleep.  Patient had agreed to do so.  Today, patient reported that he has been taking Seroquel at night and is sleeping better compared to before.  He stated that he still wakes up but it is still better than it was a few weeks ago.  He informed that the main reason why he stopped taking Seroquel and switched to olanzapine end of last year was because his insurance increased the co-pay for Seroquel to be $40 for 90-day prescription whereas for olanzapine it is only $7.  He stated that he does not mind paying that extra money because he is feeling much better now.  He stated that he still takes trazodone along with it.  When writer suggested that he can try taking only Seroquel and seeing if that is enough to help him sleep better he stated he would try that and go from there. Patient stated that he is aware that  he has been diagnosed with bipolar disorder and he is a bit puzzled because in the past he has mainly had depressive episodes and that he knows several people around him with a bipolar disorder and he knows that he does not behave like them.  Once again he asked if he can see provider face-to-face in the [place] office as it is closer to him and he really wants to be seen in the office instead of virtual visits.  Writer informed him that Probation officer had spoken to the adult psychiatrist in [place] office and they informed that they have not started seeing patients in the office yet and are only doing virtual visits.  Patient stated that in that case he will continue to talk to the writer.   Visit Diagnosis:    ICD-10-CM   1. Bipolar disorder, in full remission, most recent episode depressed ([place])  F31.76 traZODone (DESYREL) 50 MG tablet    FLUoxetine (PROZAC) 10 MG capsule  2. 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[place])    Paraphimosis    Peyronie disease    Secondary hyperparathyroidism of renal origin ([place])    Skin cancer    Testicular hypofunction    Urinary frequency    Urinary hesitancy     Past Surgical History:  Procedure Laterality Date   arm fracture     BONE MARROW BIOPSY     COLONOSCOPY WITH PROPOFOL N/A 12/23/2014   Procedure: COLONOSCOPY WITH PROPOFOL;  Surgeon: Manya Silvas, MD;  Location[facility]  ENDOSCOPY;  Service: Endoscopy;  Laterality: N/A;   COLONOSCOPY WITH PROPOFOL N/A 02/06/2018   Procedure: COLONOSCOPY WITH PROPOFOL;  Surgeon: Manya Silvas, MD;  Location: [facility] ENDOSCOPY;  Service: Endoscopy;  Laterality: N/A;   KNEE ARTHROPLASTY Left 02/22/2018   Procedure: COMPUTER ASSISTED TOTAL KNEE ARTHROPLASTY;  Surgeon: Dereck Leep, MD;  Location: [facility] ORS;  Service: Orthopedics;  Laterality: Left;   MELANOMA EXCISION     PENILE PROSTHESIS IMPLANT     PENILE PROSTHESIS IMPLANT N/A 08/25/2015   Procedure: REPLACEMENT OF INFLATABLE PENILE PROSTHESIS COMPONENTS;  Surgeon: Cleon Gustin, MD;  Location: [facility] ORS;  Service: Urology;  Laterality: N/A;   SKIN CANCER EXCISION     nose   TONSILLECTOMY     VASECTOMY      Family Psychiatric History: see below  Family History:  Family History  Problem Relation Age of Onset   Depression Sister    Bipolar disorder Other    Prostate cancer Neg Hx    Kidney cancer Neg Hx    Bladder Cancer Neg Hx     Social History:  Social History   Socioeconomic History   Marital status: Divorced    Spouse name: Not on file   Number of children: 2   Years of education: Not on file   Highest education level: High school graduate  Occupational History   Occupation: retired  Tobacco Use   Smoking status: Former Smoker    Types: Cigarettes    Quit date: 09/08/1972    Years since quitting: 47.1   Smokeless tobacco: Never Used  Vaping Use   Vaping Use: Never used  Substance and Sexual Activity   Alcohol use: Not Currently    Alcohol/week: 0.0 standard drinks    Comment: Occasional glass of wine   Drug use: No   Sexual activity: Yes    Partners: Female    Birth control/protection: None  Other Topics Concern   Not on file  Social History Narrative   Not on file   Social Determinants of Health   Financial Resource Strain:    Difficulty of Paying Living Expenses:   Food Insecurity:    Worried About  Charity fundraiser in the Last Year:    Arboriculturist in the Last Year:   Transportation Needs:    Film/video editor (Medical):    Lack of Transportation (Non-Medical):   Physical Activity: Inactive   Days of Exercise per Week: 0 days   Minutes of Exercise per Session: 0 min  Stress:    Feeling of Stress :   Social Connections:    Frequency of Communication with Friends and Family:    Frequency of Social Gatherings with Friends and Family:  Attends Religious Services:    Active Member of Clubs or Organizations:    Attends Archivist Meetings:    Marital Status:     Allergies:  Allergies  Allergen Reactions   Mirtazapine Rash   Aspirin     Other reaction(s): Unknown Patient was instructed not to take aspirin but can not remember why   Atomoxetine Other (See Comments)    Urinary sx   Strattera [Atomoxetine Hcl] Other (See Comments)    Urinary sx   Xyzal [Levocetirizine Dihydrochloride] Rash    Patient reported on 09/21/48    Metabolic Disorder Labs: No results found for: HGBA1C, MPG No results found for: PROLACTIN Lab Results  Component Value Date   CHOL 202 (H) 04/26/2019   TRIG 176 (H) 04/26/2019   HDL 41 04/26/2019   CHOLHDL 3.9 04/24/2018   VLDL 50 (H) 10/03/2017   LDLCALC 130 (H) 04/26/2019   LDLCALC 96 04/24/2018   Lab Results  Component Value Date   TSH 1.226 08/01/2019   TSH 1.060 04/26/2019    Therapeutic Level Labs: Lab Results  Component Value Date   LITHIUM 0.70 08/06/2019   LITHIUM 0.78 08/05/2019   No results found for: VALPROATE No components found for:  CBMZ  Current Medications: Current Outpatient Medications  Medication Sig Dispense Refill   allopurinol (ZYLOPRIM) 100 MG tablet Take 200 mg by mouth daily.      amLODipine (NORVASC) 10 MG tablet Take 1 tablet (10 mg total) by mouth daily. 90 tablet 1   benazepril (LOTENSIN) 40 MG tablet Take 40 mg by mouth daily.      colchicine 0.6 MG tablet  Take 0.5 tablets (0.3 mg total) by mouth daily as needed. 5 tablet 0   cyproheptadine (PERIACTIN) 4 MG tablet Take one tablet three hours prior to intercourse 90 tablet 3   FLUoxetine (PROZAC) 10 MG capsule Take 1 capsule (10 mg total) by mouth daily. 90 capsule 1   hydrALAZINE (APRESOLINE) 100 MG tablet Take 100 mg by mouth 3 (three) times daily.     loperamide (IMODIUM) 2 MG capsule Take 2 mg by mouth as needed for diarrhea or loose stools.     Multiple Vitamin (MULTIVITAMIN) tablet Take 1 tablet by mouth daily.     QUEtiapine (SEROQUEL) 25 MG tablet Take 1 tablet (25 mg total) by mouth at bedtime. 90 tablet 1   traZODone (DESYREL) 50 MG tablet Take 1 tablet (50 mg total) by mouth at bedtime as needed for sleep. 90 tablet 0   Vitamin D, Cholecalciferol, 25 MCG (1000 UT) TABS Take 1,000 tablets by mouth daily.      No current facility-administered medications for this visit.      Psychiatric Specialty Exam: ROS  There were no vitals taken for this visit.There is no height or weight on file to calculate BMI.  General Appearance: unable to assess due to phone visit  Eye Contact:  Unable to assess due to phone visit  Speech:  Clear and Coherent and Normal Rate  Volume:  Normal  Mood:  Euthymic  Affect:  Congruent  Thought Process:  Goal Directed, Linear and Descriptions of Associations: Intact  Orientation:  Full (Time, Place, and Person)  Thought Content: Logical, Rumination and fixated on seroquel   Suicidal Thoughts:  No  Homicidal Thoughts:  No  Memory:  Recent;   Good Remote;   Good  Judgement:  Fair  Insight:  Fair  Psychomotor Activity:  Normal  Concentration:  Concentration: Good and Attention Span:  Fair  Recall:  Roel Cluck of Knowledge: Good  Language: Good  Akathisia:  Negative  Handed:  Right  AIMS (if indicated): not done due to phone visit  Assets:  Communication Skills Desire for Improvement Financial Resources/Insurance  ADL's:  Intact  Cognition:  WNL  Sleep:  Improved   Screenings: Mini-Mental     Office Visit from [facility]/23/2017 in [facility]  Total Score (max 30 points ) 28    PHQ2-9     Clinical Support from 11/20/2018 in [place] Visit from 04/24/2018 in [place] from 11/14/2017 in [place] Visit from 04/05/2017 in [place] Visit from 02/23/2017 in [place]  PHQ-2 Total Score 0 0 0 0 0  PHQ-9 Total Score -- 3 -- -- --       Assessment and Plan: Patient appears to be doing better on his current combination of Prozac with Seroquel.  He was recommended to continue same regimen for now.  1. Bipolar disorder, in full remission, most recent episode depressed ([facility])  - Continue FLUoxetine (PROZAC) 10 MG capsule; Take 1 capsule (10 mg total) by mouth daily.  Dispense: 90 capsule; Refill: 1 - Continue Seroquel 25 mg HS. - Continue Trazodone 50 mg HS PRN  Pt requested that he is given in person appointments in the [facility] clinic location for future follow up care. He does not want to drive to [facility] to see the Probation officer in office. Pt is scheduled to see his nephrologist on August 12. F/up in 3 months with psychiatry. Pt was advised to call sooner if the need arises.  [place] Crane, MD 10/30/2019, 3:36 PM

## 2019-10-31 ENCOUNTER — Ambulatory Visit
Admission: RE | Admit: 2019-10-31 | Discharge: 2019-10-31 | Disposition: A | Discharge status: Home / Self Care | Payer: Medicare Other | Source: Ambulatory Visit | Attending: Urology | Admitting: Urology

## 2019-10-31 ENCOUNTER — Other Ambulatory Visit: Payer: Self-pay

## 2019-10-31 DIAGNOSIS — N3289 Other specified disorders of bladder: Secondary | ICD-10-CM | POA: Diagnosis not present

## 2019-10-31 DIAGNOSIS — R310 Gross hematuria: Secondary | ICD-10-CM | POA: Diagnosis not present

## 2019-10-31 DIAGNOSIS — N400 Enlarged prostate without lower urinary tract symptoms: Secondary | ICD-10-CM | POA: Diagnosis not present

## 2019-10-31 DIAGNOSIS — K8020 Calculus of gallbladder without cholecystitis without obstruction: Secondary | ICD-10-CM | POA: Diagnosis not present

## 2019-10-31 DIAGNOSIS — K6389 Other specified diseases of intestine: Secondary | ICD-10-CM | POA: Diagnosis not present

## 2019-11-01 DIAGNOSIS — R809 Proteinuria, unspecified: Secondary | ICD-10-CM | POA: Diagnosis not present

## 2019-11-01 DIAGNOSIS — N184 Chronic kidney disease, stage 4 (severe): Secondary | ICD-10-CM | POA: Diagnosis not present

## 2019-11-01 DIAGNOSIS — I10 Essential (primary) hypertension: Secondary | ICD-10-CM | POA: Diagnosis not present

## 2019-11-01 DIAGNOSIS — N2581 Secondary hyperparathyroidism of renal origin: Secondary | ICD-10-CM | POA: Diagnosis not present

## 2019-11-01 DIAGNOSIS — D631 Anemia in chronic kidney disease: Secondary | ICD-10-CM | POA: Diagnosis not present

## 2019-11-02 ENCOUNTER — Other Ambulatory Visit: Payer: Self-pay | Admitting: Urology

## 2019-11-02 ENCOUNTER — Other Ambulatory Visit: Payer: Self-pay

## 2019-11-02 ENCOUNTER — Telehealth (HOSPITAL_COMMUNITY): Payer: Self-pay | Admitting: Psychiatry

## 2019-11-02 ENCOUNTER — Encounter: Payer: Self-pay | Admitting: Urology

## 2019-11-02 ENCOUNTER — Ambulatory Visit (INDEPENDENT_AMBULATORY_CARE_PROVIDER_SITE_OTHER): Payer: Medicare Other | Admitting: Urology

## 2019-11-02 VITALS — BP 150/79 | HR 65 | Ht 68.0 in | Wt 185.0 lb

## 2019-11-02 DIAGNOSIS — R310 Gross hematuria: Secondary | ICD-10-CM | POA: Diagnosis not present

## 2019-11-02 LAB — URINALYSIS, COMPLETE
Bilirubin, UA: NEGATIVE
Ketones, UA: NEGATIVE
Leukocytes,UA: NEGATIVE
Nitrite, UA: NEGATIVE
RBC, UA: NEGATIVE
Specific Gravity, UA: 1.015 (ref 1.005–1.030)
Urobilinogen, Ur: 0.2 mg/dL (ref 0.2–1.0)
pH, UA: 6 (ref 5.0–7.5)

## 2019-11-02 LAB — MICROSCOPIC EXAMINATION
Bacteria, UA: NONE SEEN
RBC, Urine: NONE SEEN /hpf (ref 0–2)

## 2019-11-02 NOTE — Telephone Encounter (Signed)
I called and spoke with pt. He stated that he stopped taking the Trazodone starting the night of August 10 after talking to the Probation officer. He stated that he had a hard time with sleeping after he discontinued it. He asked if he can just go ahead and get back on taking the Seroquel and Trazodone together. Writer agreed with this and advised him to take the Seroquel and Trazodone together to help with his sleep. Pt verbalized his understanding.

## 2019-11-02 NOTE — Progress Notes (Signed)
   11/02/19  CC:  Chief Complaint  Patient presents with  . Cysto    HPI: Refer to Shannon's note 10/02/2019.  2 episodes gross hematuria after masturbation  Stage IV chronic kidney disease, noncontrast CT abdomen pelvis without significant abnormalities  Blood pressure (!) 150/79, pulse 65, height 5' 8"  (1.727 m), weight 185 lb (83.9 kg). NED. A&Ox3.   No respiratory distress   Abd soft, NT, ND Normal phallus with bilateral descended testicles  Cystoscopy Procedure Note  Patient identification was confirmed, informed consent was obtained, and patient was prepped using Betadine solution.  Lidocaine jelly was administered per urethral meatus.     Pre-Procedure: - Inspection reveals a normal caliber urethral meatus. -Penile implant present  Procedure: The flexible cystoscope was introduced without difficulty - No urethral strictures/lesions are present. - Prominent lateral lobe enlargement with hypervascularity/friability prostate  - Elevated bladder neck - Bilateral ureteral orifices identified - Bladder mucosa  reveals no ulcers, tumors, or lesions - No bladder stones - No trabeculation  Retroflexion shows no intravesical median lobe, backbleeding from prostate   Post-Procedure: - Patient tolerated the procedure well  Assessment/ Plan:  1.  Gross hematuria  Most likely secondary to BPH  Urine cytology sent  Has follow-up scheduled with Wilhemena Durie, MD

## 2019-11-06 ENCOUNTER — Telehealth: Payer: Self-pay | Admitting: Family Medicine

## 2019-11-06 LAB — CYTOLOGY - NON PAP

## 2019-11-06 NOTE — Telephone Encounter (Signed)
-----   Message from Abbie Sons, MD sent at 11/06/2019  3:35 PM EDT ----- Urine cytology showed no abnormal cells

## 2019-11-06 NOTE — Telephone Encounter (Signed)
Patient notified and voiced understanding.

## 2019-11-15 ENCOUNTER — Ambulatory Visit (INDEPENDENT_AMBULATORY_CARE_PROVIDER_SITE_OTHER): Payer: Medicare Other | Admitting: Family Medicine

## 2019-11-15 ENCOUNTER — Other Ambulatory Visit: Payer: Self-pay

## 2019-11-15 ENCOUNTER — Encounter: Payer: Self-pay | Admitting: Family Medicine

## 2019-11-15 NOTE — Progress Notes (Signed)
Pt states he does not need anything at this moment and is technically not due to follow up until Nov. Will cancel this visit and schedule out into November for regular f/u.

## 2019-11-15 NOTE — Progress Notes (Deleted)
   BP 136/69   Pulse 62   Temp 98.4 F (36.9 C) (Oral)   Wt 188 lb (85.3 kg)   SpO2 97%   BMI 28.59 kg/m    Subjective:    Patient ID: [name], male    DOB: [birth year], 74 y.o.   MRN: [id number]  HPI: Dasean Brow. is a 74 y.o. male  Chief Complaint  Patient presents with  . Hypertension    Relevant past medical, surgical, family and social history reviewed and updated as indicated. Interim medical history since our last visit reviewed. Allergies and medications reviewed and updated.  Review of Systems  Per HPI unless specifically indicated above     Objective:    BP 136/69   Pulse 62   Temp 98.4 F (36.9 C) (Oral)   Wt 188 lb (85.3 kg)   SpO2 97%   BMI 28.59 kg/m   Wt Readings from Last 3 Encounters:  11/15/19 188 lb (85.3 kg)  11/02/19 185 lb (83.9 kg)  10/02/19 185 lb (83.9 kg)    Physical Exam  Results for orders placed or performed in visit on 11/02/19  Cytology - Non PAP;  Result Value Ref Range   CYTOLOGY - NON GYN      Cytology - Non PAP CASE: [id number] PATIENT: Kee Grams Non-Gynecological Cytology Report     Specimen Submitted: A. Urine, bladder wash  Clinical History: None provided    DIAGNOSIS: A. URINARY BLADDER; WASHING: - NEGATIVE FOR HIGH-GRADE UROTHELIAL CARCINOMA. - BENIGN UROTHELIAL CELLS, BOTH SINGLY AND IN SMALL CLUSTERS IN A BACKGROUND OF MIXED INFLAMMATION.  GROSS DESCRIPTION: A. Labeled: Received labeled with the patient's name and date of birth (per requisition urine, bladder wash) Received: Fresh Volume: 60 mL Description of fluid and container in which it is received: Received in a clear plastic specimen container with a yellow screw top lid is red cloudy fluid. Specimen material submitted for: ThinPrep  Final Diagnosis performed by Allena Napoleon, MD.   Electronically signed 11/06/2019 2:52:02PM The electronic signature indicates that the named Attending Pathologist has evaluated the  specimen Technical component performed at [facility], [address], [place], [place] [address] Lab: [id number] Dir: Rush Farmer, MD, MMM  Professional component performed at [facility], [facility], [place], [place], [place] [address] Lab: [phone] Dir: Dellia Nims. Reuel Derby, MD       Assessment & Plan:   Problem List Items Addressed This Visit    None       Follow up plan: No follow-ups on file.

## 2019-11-22 DIAGNOSIS — H18521 Epithelial (juvenile) corneal dystrophy, right eye: Secondary | ICD-10-CM | POA: Diagnosis not present

## 2019-11-30 ENCOUNTER — Ambulatory Visit (INDEPENDENT_AMBULATORY_CARE_PROVIDER_SITE_OTHER): Payer: Medicare Other

## 2019-11-30 VITALS — Ht 68.0 in | Wt 185.0 lb

## 2019-11-30 DIAGNOSIS — Z0000 Encounter for general adult medical examination without abnormal findings: Secondary | ICD-10-CM

## 2019-11-30 NOTE — Progress Notes (Signed)
I connected with Luis Miller today by telephone and verified that I am speaking with the correct person using two identifiers. Location patient: home Location provider: work Persons participating in the virtual visit: Luis Miller, Glenna Durand LPN.   I discussed the limitations, risks, security and privacy concerns of performing an evaluation and management service by telephone and the availability of in person appointments. I also discussed with the patient that there may be a patient responsible charge related to this service. The patient expressed understanding and verbally consented to this telephonic visit.    Interactive audio and video telecommunications were attempted between this provider and patient, however failed, due to patient having technical difficulties OR patient did not have access to video capability.  We continued and completed visit with audio only.     Vital signs may be patient reported or missing.  Subjective:   Luis Miller. is a 74 y.o. male who presents for Medicare Annual/Subsequent preventive examination.  Review of Systems     Cardiac Risk Factors include: advanced age (>37mn, >>38women);male gender     Objective:    Today's Vitals   11/30/19 0941  Weight: 185 lb (83.9 kg)  Height: _0  (1.727 m)   Body mass index is 28.13 kg/m.  Advanced Directives 11/30/2019 08/02/2019 08/01/2019 11/20/2018 02/22/2018 02/08/2018 02/06/2018  Does Patient Have a Medical Advance Directive? Yes No No Yes Yes Yes Yes  Type of [place] - - Living will;Healthcare Power of [place]Living will [place] will -  Does patient want to make changes to medical advance directive? - - - - No - Patient declined - -  Copy of [place] Chart? No - copy requested - - Yes - validated most recent copy scanned in chart (See row information) No - copy  requested - -  Would patient like information on creating a medical advance directive? - No - Patient declined - - - - -  Some encounter information is confidential and restricted. Go to Review Flowsheets activity to see all data.    Current Medications (verified) Outpatient Encounter Medications as of 11/30/2019  Medication Sig  . allopurinol (ZYLOPRIM) 100 MG tablet Take 200 mg by mouth daily.   .Marland KitchenamLODipine (NORVASC) 10 MG tablet Take 1 tablet (10 mg total) by mouth daily.  . benazepril (LOTENSIN) 40 MG tablet Take 40 mg by mouth daily.   . colchicine 0.6 MG tablet Take 0.5 tablets (0.3 mg total) by mouth daily as needed.  . cyproheptadine (PERIACTIN) 4 MG tablet Take one tablet three hours prior to intercourse  . FLUoxetine (PROZAC) 10 MG capsule Take 1 capsule (10 mg total) by mouth daily.  . hydrALAZINE (APRESOLINE) 100 MG tablet Take 100 mg by mouth 3 (three) times daily.  .Marland Kitchenloperamide (IMODIUM) 2 MG capsule Take 2 mg by mouth as needed for diarrhea or loose stools.  . Multiple Vitamin (MULTIVITAMIN) tablet Take 1 tablet by mouth daily.  . QUEtiapine (SEROQUEL) 25 MG tablet Take 1 tablet (25 mg total) by mouth at bedtime.  . traZODone (DESYREL) 50 MG tablet Take 1 tablet (50 mg total) by mouth at bedtime as needed for sleep.  . Vitamin D, Cholecalciferol, 25 MCG (1000 UT) TABS Take 1,000 tablets by mouth daily.    No facility-administered encounter medications on file as of 11/30/2019.    Allergies (verified) Mirtazapine, Aspirin, Atomoxetine, Strattera [atomoxetine hcl], and Xyzal [levocetirizine dihydrochloride]  History: Past Medical History:  Diagnosis Date  . Anemia   . Apnea, sleep   . Arthritis   . Bipolar affective ([place])   . Cancer ([place]) malignant melanoma  . CRI (chronic renal insufficiency)   . Crohn's disease ([place])   . Depression   . Diabetes insipidus ([place])   . Erectile dysfunction   . Gout   . Hyperlipidemia   . Hypertension   . IBS (irritable bowel  syndrome)   . Impotence   . Internal hemorrhoids   . 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 Paraphimosis   . Peyronie disease   . Secondary hyperparathyroidism of renal origin ([place])   . Skin cancer   . Testicular hypofunction   . Urinary frequency   . Urinary hesitancy    Past Surgical History:  Procedure Laterality Date  . arm fracture    . BONE MARROW BIOPSY    . COLONOSCOPY WITH PROPOFOL N/A 12/23/2014   Procedure: COLONOSCOPY WITH PROPOFOL;  Surgeon: Manya Silvas, MD;  Location: [facility] ENDOSCOPY;  Service: Endoscopy;  Laterality: N/A;  . COLONOSCOPY WITH PROPOFOL N/A 02/06/2018   Procedure: COLONOSCOPY WITH PROPOFOL;  Surgeon: Manya Silvas, MD;  Location: [facility] ENDOSCOPY;  Service: Endoscopy;  Laterality: N/A;  . KNEE ARTHROPLASTY Left 02/22/2018   Procedure: COMPUTER ASSISTED TOTAL KNEE ARTHROPLASTY;  Surgeon: Dereck Leep, MD;  Location: [facility] ORS;  Service: Orthopedics;  Laterality: Left;  Marland Kitchen MELANOMA EXCISION    . PENILE PROSTHESIS IMPLANT    . PENILE PROSTHESIS IMPLANT N/A 08/25/2015   Procedure: REPLACEMENT OF INFLATABLE PENILE PROSTHESIS COMPONENTS;  Surgeon: Cleon Gustin, MD;  Location: [facility] ORS;  Service: Urology;  Laterality: N/A;  . SKIN CANCER EXCISION     nose  . TONSILLECTOMY    . VASECTOMY     Family History  Problem Relation Age of Onset  . Depression Sister   . Bipolar disorder Other   . Prostate cancer Neg Hx   . Kidney cancer Neg Hx   . Bladder Cancer Neg Hx    Social History   Socioeconomic History  . Marital status: Divorced    Spouse name: Not on file  . Number of children: 2  . Years of education: Not on file  . Highest education level: High school graduate  Occupational History  . Occupation: retired  Tobacco Use  . Smoking status: Former Smoker    Types: Cigarettes    Quit date: 09/08/1972    Years since quitting: 47.2  . Smokeless tobacco: Never Used  Vaping Use  . Vaping Use: Never used  Substance and Sexual Activity  .  Alcohol use: Not Currently    Alcohol/week: 0.0 standard drinks    Comment: Occasional glass of wine  . Drug use: No  . Sexual activity: Yes    Partners: Female    Birth control/protection: None  Other Topics Concern  . Not on file  Social History Narrative  . Not on file   Social Determinants of Health   Financial Resource Strain: Low Risk   . Difficulty of Paying Living Expenses: Not hard at all  Food Insecurity: No Food Insecurity  . Worried About Charity fundraiser in the Last Year: Never true  . Ran Out of Food in the Last Year: Never true  Transportation Needs: No Transportation Needs  . Lack of Transportation (Medical): No  . Lack of Transportation (Non-Medical): No  Physical Activity: Sufficiently Active  . Days of Exercise per Week: 7 days  .  Minutes of Exercise per Session: 30 min  Stress: No Stress Concern Present  . Feeling of Stress : Not at all  Social Connections:   . Frequency of Communication with Friends and Family: Not on file  . Frequency of Social Gatherings with Friends and Family: Not on file  . Attends Religious Services: Not on file  . Active Member of Clubs or Organizations: Not on file  . Attends Archivist Meetings: Not on file  . Marital Status: Not on file    Tobacco Counseling Counseling given: Not Answered   Clinical Intake:  Pre-visit preparation completed: Yes  Pain : No/denies pain     Nutritional Status: BMI 25 -29 Overweight Nutritional Risks: None Diabetes: No  How often do you need to have someone help you when you read instructions, pamphlets, or other written materials from your doctor or pharmacy?: 1 - Never What is the last grade level you completed in school?: 12th grade  Diabetic?no  Interpreter Needed?: No  Information entered by :: NAllen LPN   Activities of Daily Living In your present state of health, do you have any difficulty performing the following activities: 11/30/2019 08/02/2019  Hearing?  Y N  Comment hearing aides -  Vision? N N  Difficulty concentrating or making decisions? N N  Walking or climbing stairs? N N  Dressing or bathing? N N  Doing errands, shopping? N Y  Conservation officer, nature and eating ? N -  Using the Toilet? N -  In the past six months, have you accidently leaked urine? Y -  Do you have problems with loss of bowel control? N -  Managing your Medications? N -  Managing your Finances? N -  Some recent data might be hidden    Patient Care Team: Volney American, PA-C as PCP - General (Family Medicine) Anthonette Legato, MD (Internal Medicine) Laneta Simmers as Physician Assistant (Urology) Hooten, Laurice Record, MD (Orthopedic Surgery) Rainey Pines, MD as Consulting Physician (Psychiatry) Oneta Rack, MD (Dermatology)  Indicate any recent Medical Services you may have received from other than [facility] providers in the past year (date may be approximate).     Assessment:   This is a routine wellness examination for [place].  Hearing/Vision screen  Hearing Screening   _0  _1  _2  _3  _4  _5  _6  _7  _8   Right ear:           Left ear:           Vision Screening Comments: Regular eye exams, [place]  Dietary issues and exercise activities discussed: Current Exercise Habits: Home exercise routine, Type of exercise: walking, Time (Minutes): 30  Goals    . DIET - INCREASE WATER INTAKE     Recommend drinking at least 6-8 glasses of water a day     . Patient Stated     11/30/2019, stay healthy      Depression Screen PHQ 2/9 Scores 11/30/2019 11/20/2018 04/24/2018 11/14/2017 04/05/2017 02/23/2017 11/11/2016  PHQ - 2 Score 0 0 0 0 0 0 0  PHQ- 9 Score - - 3 - - - -    Fall Risk Fall Risk  11/30/2019 11/20/2018 04/24/2018 11/14/2017 10/03/2017  Falls in the past year? 0 0 0 No No  Comment - - - - -  Risk for fall due to : History of fall(s);Medication side effect - - - -  Follow up Falls evaluation completed;Education  provided;Falls prevention discussed - Falls evaluation completed - -  Any stairs in or around the home? No  If so, are there any without handrails? n/a Home free of loose throw rugs in walkways, pet beds, electrical cords, etc? Yes  Adequate lighting in your home to reduce risk of falls? Yes   ASSISTIVE DEVICES UTILIZED TO PREVENT FALLS:  Life alert? No  Use of a cane, walker or w/c? No  Grab bars in the bathroom? No  Shower chair or bench in shower? No  Elevated toilet seat or a handicapped toilet? Yes   TIMED UP AND GO:  Was the test performed? No .    Cognitive Function: MMSE - Mini Mental State Exam 02/23/2017  Orientation to time 5  Orientation to Place 5  Registration 3  Attention/ Calculation 4  Recall 2  Language- name 2 objects 2  Language- repeat 1  Language- follow 3 step command 3  Language- read & follow direction 1  Write a sentence 1  Copy design 1  Total score 28     6CIT Screen 11/20/2018 11/14/2017 11/11/2016  What Year? 0 points 0 points 0 points  What month? 0 points 0 points 0 points  What time? 0 points 0 points 0 points  Count back from 20 0 points 0 points 0 points  Months in reverse 0 points 0 points 0 points  Repeat phrase 2 points 2 points 0 points  Total Score 2 2 0    Immunizations Immunization History  Administered Date(s) Administered  . Fluad Quad(high Dose 65+) 11/20/2018  . Influenza, High Dose Seasonal PF 01/02/2016, 01/12/2017, 12/22/2017  . Influenza,inj,Quad PF,6+ Mos 05/13/2015  . Influenza-Unspecified 02/27/2014  . PFIZER SARS-COV-2 Vaccination 05/17/2019, 06/07/2019  . Pneumococcal Conjugate-13 03/20/2014  . Pneumococcal-Unspecified 11/05/2004, 04/29/2009  . Td 08/12/2011  . Zoster 10/07/2009    TDAP status: Up to date Flu Vaccine status: Up to date Pneumococcal vaccine status: Up to date Covid-19 vaccine status: Completed vaccines  Qualifies for Shingles Vaccine? Yes   Zostavax completed No   Shingrix  Completed?: No.    Education has been provided regarding the importance of this vaccine. Patient has been advised to call insurance company to determine out of pocket expense if they have not yet received this vaccine. Advised may also receive vaccine at local pharmacy or Health Dept. Verbalized acceptance and understanding.  Screening Tests Health Maintenance  Topic Date Due  . INFLUENZA VACCINE  10/21/2019  . TETANUS/TDAP  08/11/2021  . COLONOSCOPY  02/07/2023  . COVID-19 Vaccine  Completed  . Hepatitis C Screening  Completed    Health Maintenance  Health Maintenance Due  Topic Date Due  . INFLUENZA VACCINE  10/21/2019    Colorectal cancer screening: Completed 02/06/2018. Repeat every 10 years  Lung Cancer Screening: (Low Dose CT Chest recommended if Age 20-80 years, 30 pack-year currently smoking OR have quit w/in 15years.) does not qualify.   Lung Cancer Screening Referral: no  Additional Screening:  Hepatitis C Screening: does qualify; Completed 08/02/2019  Vision Screening: Recommended annual ophthalmology exams for early detection of glaucoma and other disorders of the eye. Is the patient up to date with their annual eye exam?  Yes  Who is the provider or what is the name of the office in which the patient attends annual eye exams? [facility] If pt is not established with a provider, would they like to be referred to a provider to establish care? No .   Dental Screening: Recommended annual dental exams for proper oral hygiene  [employer]  Referral / Chronic Care Management: CRR required this visit?  No   CCM required this visit?  No      Plan:     I have personally reviewed and noted the following in the patient's chart:   . Medical and social history . Use of alcohol, tobacco or illicit drugs  . Current medications and supplements . Functional ability and status . Nutritional status . Physical activity . Advanced directives . List of other  physicians . Hospitalizations, surgeries, and ER visits in previous 12 months . Vitals . Screenings to include cognitive, depression, and falls . Referrals and appointments  In addition, I have reviewed and discussed with patient certain preventive protocols, quality metrics, and best practice recommendations. A written personalized care plan for preventive services as well as general preventive health recommendations were provided to patient.     Kellie Simmering, LPN   [id number]   Nurse Notes: 6 CIT not performed. Patient had somewhere he needed to be at 10 am. He appeared cognitive through conversation.

## 2019-11-30 NOTE — Patient Instructions (Signed)
Luis Miller , Thank you for taking time to come for your Medicare Wellness Visit. I appreciate your ongoing commitment to your health goals. Please review the following plan we discussed and let me know if I can assist you in the future.   Screening recommendations/referrals: Colonoscopy: not required Recommended yearly ophthalmology/optometry visit for glaucoma screening and checkup Recommended yearly dental visit for hygiene and checkup  Vaccinations: Influenza vaccine: due Pneumococcal vaccine: completed 03/20/2014 Tdap vaccine: completed 08/12/2011 Shingles vaccine: discussed   Covid-19: 06/07/2019, 05/17/2019  Advanced directives: Please bring a copy of your POA (Power of Attorney) and/or Living Will to your next appointment.   Conditions/risks identified: none  Next appointment: Follow up in one year for your annual wellness visit.   Preventive Care 46 Years and Older, Male Preventive care refers to lifestyle choices and visits with your health care provider that can promote health and wellness. What does preventive care include?  A yearly physical exam. This is also called an annual well check.  Dental exams once or twice a year.  Routine eye exams. Ask your health care provider how often you should have your eyes checked.  Personal lifestyle choices, including:  Daily care of your teeth and gums.  Regular physical activity.  Eating a healthy diet.  Avoiding tobacco and drug use.  Limiting alcohol use.  Practicing safe sex.  Taking low doses of aspirin every day.  Taking vitamin and mineral supplements as recommended by your health care provider. What happens during an annual well check? The services and screenings done by your health care provider during your annual well check will depend on your age, overall health, lifestyle risk factors, and family history of disease. Counseling  Your health care provider may ask you questions about your:  Alcohol  use.  Tobacco use.  Drug use.  Emotional well-being.  Home and relationship well-being.  Sexual activity.  Eating habits.  History of falls.  Memory and ability to understand (cognition).  Work and work Statistician. Screening  You may have the following tests or measurements:  Height, weight, and BMI.  Blood pressure.  Lipid and cholesterol levels. These may be checked every 5 years, or more frequently if you are over 17 years old.  Skin check.  Lung cancer screening. You may have this screening every year starting at age 58 if you have a 30-pack-year history of smoking and currently smoke or have quit within the past 15 years.  Fecal occult blood test (FOBT) of the stool. You may have this test every year starting at age 71.  Flexible sigmoidoscopy or colonoscopy. You may have a sigmoidoscopy every 5 years or a colonoscopy every 10 years starting at age 73.  Prostate cancer screening. Recommendations will vary depending on your family history and other risks.  Hepatitis C blood test.  Hepatitis B blood test.  Sexually transmitted disease (STD) testing.  Diabetes screening. This is done by checking your blood sugar (glucose) after you have not eaten for a while (fasting). You may have this done every 1-3 years.  Abdominal aortic aneurysm (AAA) screening. You may need this if you are a current or former smoker.  Osteoporosis. You may be screened starting at age 56 if you are at high risk. Talk with your health care provider about your test results, treatment options, and if necessary, the need for more tests. Vaccines  Your health care provider may recommend certain vaccines, such as:  Influenza vaccine. This is recommended every year.  Tetanus, diphtheria,  and acellular pertussis (Tdap, Td) vaccine. You may need a Td booster every 10 years.  Zoster vaccine. You may need this after age 52.  Pneumococcal 13-valent conjugate (PCV13) vaccine. One dose is  recommended after age 70.  Pneumococcal polysaccharide (PPSV23) vaccine. One dose is recommended after age 36. Talk to your health care provider about which screenings and vaccines you need and how often you need them. This information is not intended to replace advice given to you by your health care provider. Make sure you discuss any questions you have with your health care provider. Document Released: 04/04/2015 Document Revised: 11/26/2015 Document Reviewed: 01/07/2015 Elsevier Interactive Patient Education  2017 [employer] Prevention in the Home Falls can cause injuries. They can happen to people of all ages. There are many things you can do to make your home safe and to help prevent falls. What can I do on the outside of my home?  Regularly fix the edges of walkways and driveways and fix any cracks.  Remove anything that might make you trip as you walk through a door, such as a raised step or threshold.  Trim any bushes or trees on the path to your home.  Use bright outdoor lighting.  Clear any walking paths of anything that might make someone trip, such as rocks or tools.  Regularly check to see if handrails are loose or broken. Make sure that both sides of any steps have handrails.  Any raised decks and porches should have guardrails on the edges.  Have any leaves, snow, or ice cleared regularly.  Use sand or salt on walking paths during winter.  Clean up any spills in your garage right away. This includes oil or grease spills. What can I do in the bathroom?  Use night lights.  Install grab bars by the toilet and in the tub and shower. Do not use towel bars as grab bars.  Use non-skid mats or decals in the tub or shower.  If you need to sit down in the shower, use a plastic, non-slip stool.  Keep the floor dry. Clean up any water that spills on the floor as soon as it happens.  Remove soap buildup in the tub or shower regularly.  Attach bath mats  securely with double-sided non-slip rug tape.  Do not have throw rugs and other things on the floor that can make you trip. What can I do in the bedroom?  Use night lights.  Make sure that you have a light by your bed that is easy to reach.  Do not use any sheets or blankets that are too big for your bed. They should not hang down onto the floor.  Have a firm chair that has side arms. You can use this for support while you get dressed.  Do not have throw rugs and other things on the floor that can make you trip. What can I do in the kitchen?  Clean up any spills right away.  Avoid walking on wet floors.  Keep items that you use a lot in easy-to-reach places.  If you need to reach something above you, use a strong step stool that has a grab bar.  Keep electrical cords out of the way.  Do not use floor polish or wax that makes floors slippery. If you must use wax, use non-skid floor wax.  Do not have throw rugs and other things on the floor that can make you trip. What can I do with my  stairs?  Do not leave any items on the stairs.  Make sure that there are handrails on both sides of the stairs and use them. Fix handrails that are broken or loose. Make sure that handrails are as long as the stairways.  Check any carpeting to make sure that it is firmly attached to the stairs. Fix any carpet that is loose or worn.  Avoid having throw rugs at the top or bottom of the stairs. If you do have throw rugs, attach them to the floor with carpet tape.  Make sure that you have a light switch at the top of the stairs and the bottom of the stairs. If you do not have them, ask someone to add them for you. What else can I do to help prevent falls?  Wear shoes that:  Do not have high heels.  Have rubber bottoms.  Are comfortable and fit you well.  Are closed at the toe. Do not wear sandals.  If you use a stepladder:  Make sure that it is fully opened. Do not climb a closed  stepladder.  Make sure that both sides of the stepladder are locked into place.  Ask someone to hold it for you, if possible.  Clearly mark and make sure that you can see:  Any grab bars or handrails.  First and last steps.  Where the edge of each step is.  Use tools that help you move around (mobility aids) if they are needed. These include:  Canes.  Walkers.  Scooters.  Crutches.  Turn on the lights when you go into a dark area. Replace any light bulbs as soon as they burn out.  Set up your furniture so you have a clear path. Avoid moving your furniture around.  If any of your floors are uneven, fix them.  If there are any pets around you, be aware of where they are.  Review your medicines with your doctor. Some medicines can make you feel dizzy. This can increase your chance of falling. Ask your doctor what other things that you can do to help prevent falls. This information is not intended to replace advice given to you by your health care provider. Make sure you discuss any questions you have with your health care provider. Document Released: 01/02/2009 Document Revised: 08/14/2015 Document Reviewed: 04/12/2014 Elsevier Interactive Patient Education  2017 [employer].

## 2019-12-14 DIAGNOSIS — H2513 Age-related nuclear cataract, bilateral: Secondary | ICD-10-CM | POA: Diagnosis not present

## 2019-12-17 ENCOUNTER — Telehealth (INDEPENDENT_AMBULATORY_CARE_PROVIDER_SITE_OTHER): Payer: Medicare Other | Admitting: Nurse Practitioner

## 2019-12-17 ENCOUNTER — Encounter: Payer: Self-pay | Admitting: Nurse Practitioner

## 2019-12-17 ENCOUNTER — Other Ambulatory Visit: Payer: Medicare Other

## 2019-12-17 VITALS — Temp 102.0°F | Ht 68.0 in | Wt 175.0 lb

## 2019-12-17 DIAGNOSIS — U071 COVID-19: Secondary | ICD-10-CM

## 2019-12-17 DIAGNOSIS — R05 Cough: Secondary | ICD-10-CM | POA: Diagnosis not present

## 2019-12-17 DIAGNOSIS — Z20822 Contact with and (suspected) exposure to covid-19: Secondary | ICD-10-CM

## 2019-12-17 DIAGNOSIS — R059 Cough, unspecified: Secondary | ICD-10-CM

## 2019-12-17 HISTORY — DX: COVID-19: U07.1

## 2019-12-17 MED ORDER — GUAIFENESIN ER 600 MG PO TB12: 600.0000 mg | ORAL_TABLET | Freq: Two times a day (BID) | ORAL | 0 refills | Status: DC | PRN

## 2019-12-17 NOTE — Progress Notes (Signed)
Temp (!) 102 F (38.9 C) (Oral)   Ht 5' 8"  (1.727 m)   Wt 175 lb (79.4 kg)   BMI 26.61 kg/m    Subjective:    Patient ID: Luis Coss., male    DOB: 01/21/1946, 74 y.o.   MRN: [id number]  HPI: Luis Miller. is a 74 y.o. male presenting with upper respiratory symptoms.  Chief Complaint  Patient presents with  . Fever    Patient C/O body ache, fever, fatigue, and cough x one week.   UPPER RESPIRATORY TRACT INFECTION COVID test: this morning at the [facility] Onset: 1 week ago Worst symptom: fatigue, fever Fever: yes Cough: yes; productive Shortness of breath: no Wheezing: no Chest pain: no Chest tightness: no Chest congestion: no  Nasal congestion: yes Runny nose: yes Post nasal drip: no Sneezing: no Sore throat: no Swollen glands: no Sinus pressure: no Headache: no Face pain: no Toothache: no Ear pain: no  Ear pressure: no  Eyes red/itching:no Eye drainage/crusting: no  Nausea: no Vomiting: no  Change in appetite: yes Rash: no Fatigue: yes Sick contacts: no Strep contacts: no  Context: stable Recurrent sinusitis: no Relief with OTC cold/cough medications: no  Treatments attempted: Tylenol, ibuprofen    Allergies  Allergen Reactions  . Mirtazapine Rash  . Aspirin     Other reaction(s): Unknown Patient was instructed not to take aspirin but can not remember why  . Atomoxetine Other (See Comments)    Urinary sx  . Strattera [Atomoxetine Hcl] Other (See Comments)    Urinary sx  . Xyzal [Levocetirizine Dihydrochloride] Rash    Patient reported on 11/02/17   Outpatient Encounter Medications as of 12/17/2019  Medication Sig Note  . allopurinol (ZYLOPRIM) 100 MG tablet Take 200 mg by mouth daily.    Marland Kitchen amLODipine (NORVASC) 10 MG tablet Take 1 tablet (10 mg total) by mouth daily.   . benazepril (LOTENSIN) 40 MG tablet Take 40 mg by mouth daily.    . colchicine 0.6 MG tablet Take 0.5 tablets (0.3 mg total) by mouth daily as needed.   .  cyproheptadine (PERIACTIN) 4 MG tablet Take one tablet three hours prior to intercourse 11/15/2019: As needed  . FLUoxetine (PROZAC) 10 MG capsule Take 1 capsule (10 mg total) by mouth daily.   . hydrALAZINE (APRESOLINE) 100 MG tablet Take 100 mg by mouth 3 (three) times daily.   Marland Kitchen loperamide (IMODIUM) 2 MG capsule Take 2 mg by mouth as needed for diarrhea or loose stools.   . Multiple Vitamin (MULTIVITAMIN) tablet Take 1 tablet by mouth daily.   . QUEtiapine (SEROQUEL) 25 MG tablet Take 1 tablet (25 mg total) by mouth at bedtime.   . traZODone (DESYREL) 50 MG tablet Take 1 tablet (50 mg total) by mouth at bedtime as needed for sleep.   . Vitamin D, Cholecalciferol, 25 MCG (1000 UT) TABS Take 1,000 tablets by mouth daily.     No facility-administered encounter medications on file as of 12/17/2019.   Patient Active Problem List   Diagnosis Date Noted  . Suspected COVID-19 virus infection 12/17/2019  . 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 Lithium toxicity 08/05/2019  . Acute renal failure (ARF) ([place]) 08/02/2019  . Acute renal failure superimposed on stage 4 chronic kidney disease ([place]) 08/01/2019  . Insomnia disorder related to known organic factor 02/19/2019  . 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 Hemorrhoids, internal 12/12/2017  . Hx of adenomatous colonic polyps 12/05/2017  . Thrombosed external hemorrhoid  12/05/2017  . Drug-induced tremor 05/08/2017  . 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 Secondary hyperparathyroidism of renal origin ([place]) 04/05/2017  . Advanced care planning/counseling discussion 04/05/2017  . Carotid bruit 04/05/2017  . Bradycardia 11/08/2016  . Gout 02/26/2016  . Erectile dysfunction due to arterial insufficiency 07/08/2015  . Obesity 11/27/2014  . CKD (chronic kidney disease) stage 4, GFR 15-29 ml/min (HCC) 11/27/2014  . Acute gout due to renal impairment involving right foot 11/22/2014  . 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  . Hypertension 09/09/2014  .  Bipolar disorder, in full remission, most recent episode depressed ([place]) 09/09/2014  . Impotence 09/09/2014  . Lithium use 10/18/2013  . Abnormal thyroid function test 10/01/2013  . Hypertrophy of prostate with urinary obstruction and other lower urinary tract symptoms (LUTS) 01/13/2012  . Elevated prostate specific antigen (PSA) 01/13/2012  . Benign prostatic hyperplasia with urinary obstruction 01/13/2012  . ED (erectile dysfunction) of organic origin 01/13/2012  . Nocturia 01/13/2012  . 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 Peyronie's disease 01/13/2012  . Increased frequency of urination 01/13/2012  . Urinary hesitancy 01/13/2012   Past Medical History:  Diagnosis Date  . Anemia   . Apnea, sleep   . Arthritis   . Bipolar affective ([place])   . Cancer ([place]) malignant melanoma  . CRI (chronic renal insufficiency)   . Crohn's disease ([place])   . Depression   . Diabetes insipidus ([place])   . Erectile dysfunction   . Gout   . Hyperlipidemia   . Hypertension   . IBS (irritable bowel syndrome)   . Impotence   . Internal hemorrhoids   . 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[place])   . Paraphimosis   . Peyronie disease   . Secondary hyperparathyroidism of renal origin ([place])   . Skin cancer   . Testicular hypofunction   . Urinary frequency   . Urinary hesitancy    Relevant past medical, surgical, family and social history reviewed and updated as indicated. Interim medical history since our last visit reviewed.  Review of Systems  Constitutional: Positive for fatigue and fever. Negative for activity change, appetite change, chills, diaphoresis and unexpected weight change.  HENT: Positive for congestion. Negative for ear discharge, ear pain, postnasal drip, rhinorrhea, sinus pressure, sinus pain, sneezing, sore throat and trouble swallowing.   Eyes: Negative.  Negative for pain, discharge, redness and itching.  Respiratory: Positive for cough. Negative for shortness of breath and  wheezing.   Cardiovascular: Negative.  Negative for chest pain and palpitations.  Gastrointestinal: Negative.  Negative for nausea and vomiting.  Musculoskeletal: Negative.   Skin: Negative.  Negative for rash.  Neurological: Negative.  Negative for dizziness, weakness and headaches.  Psychiatric/Behavioral: Negative.     Per HPI unless specifically indicated above     Objective:    Temp (!) 102 F (38.9 C) (Oral)   Ht 5' 8"  (1.727 m)   Wt 175 lb (79.4 kg)   BMI 26.61 kg/m   Wt Readings from Last 3 Encounters:  12/17/19 175 lb (79.4 kg)  11/30/19 185 lb (83.9 kg)  11/15/19 188 lb (85.3 kg)    Physical Exam  Physical examination unable to be performed due to lack of equipment.     Assessment & Plan:   Problem List Items Addressed This Visit      Other   Suspected COVID-19 virus infection - Primary    Acute, ongoing.  Will manage symptoms symptomatically for now with Tylenol for fever, guaifenesin to help loosen secretions, plenty of hydration  including Pedialyte, and rest.  Can manage other symptoms with over-the-counter Coricidin.  Patient advised to let clinic know if Covid result comes back positive, is a candidate for monoclonal antibodies.  With any sudden onset of chest pain or shortness of breath, call 911 or seek emergent care.       Other Visit Diagnoses    Cough           Follow up plan: Return if symptoms worsen or fail to improve.  This visit was completed via telephone due to the restrictions of the COVID-19 pandemic. All issues as above were discussed and addressed but no physical exam was performed. If it was felt that the patient should be evaluated in the office, they were directed there. The patient verbally consented to this visit. Patient was unable to complete an audio/visual visit due to Lack of equipment. . Location of the patient: parking lot . Location of the provider: work . Those involved with this call:  . Provider: Carnella Guadalajara,  DNP . CMA: Lynford Humphrey, CMA . Front Desk/Registration: Don Perking  . Time spent on call: 15 minutes on the phone discussing health concerns. 30 minutes total spent in review of patient's record and preparation of their chart.  I verified patient identity using two factors (patient name and date of birth). Patient consents verbally to being seen via telemedicine visit today.

## 2019-12-17 NOTE — Assessment & Plan Note (Signed)
Acute, ongoing.  Will manage symptoms symptomatically for now with Tylenol for fever, guaifenesin to help loosen secretions, plenty of hydration including Pedialyte, and rest.  Can manage other symptoms with over-the-counter Coricidin.  Patient advised to let clinic know if Covid result comes back positive, is a candidate for monoclonal antibodies.  With any sudden onset of chest pain or shortness of breath, call 911 or seek emergent care.

## 2019-12-17 NOTE — Patient Instructions (Signed)
3 Key Steps to Take While Waiting for Your COVID-19 Test Result °To help stop the spread of COVID-19, take these 3 key steps NOW while waiting for your test results: °1. Stay home and monitor your health. °Stay home and monitor your health to help protect your friends, family, and others from possibly getting COVID-19 from you. °Stay home and away from others: °· If possible, stay away from others, especially people who are at higher risk for getting very sick from COVID-19, such as older adults and people with other medical conditions. °· If you have been in contact with someone with COVID-19, stay home and away from others for 14 days after your last contact with that person. °· If you have a fever, cough or other symptoms of COVID-19, stay home and away from others (except to get medical care). °Monitor your health: °· Watch for fever, cough, shortness of breath, or other symptoms of COVID-19. Remember, symptoms may appear 2-14 days after exposure to COVID-19 and can include: °? Fever or chills °? Cough °? Shortness of breath or difficulty breathing °? Tiredness °? Muscle or body aches °? Headache °? New loss of taste or smell °? Sore throat °? Congestion or runny nose °? Nausea or vomiting °? Diarrhea °2. Think about the people you have recently been around. °If you are diagnosed with COVID-19, a public health worker may call you to check on your health, discuss who you have been around, and ask where you spent time while you may have been able to spread COVID-19 to others. While you wait for your COVID-19 test result, think about everyone you have been around recently. This will be important information to give health workers if your test is positive.  °Complete the information on the back of this page to help you remember everyone you have been around. °3. Answer the phone call from the health department. °If a public health worker calls you, answer the call to help slow the spread of COVID-19 in your  community. °· Discussions with health department staff are confidential. This means that your personal and medical information will be kept private and only shared with those who may need to know, like your health care provider. °· Your name will not be shared with those you came in contact with. The health department will only notify people you were in close contact with (within 6 feet for more than 15 minutes) that they might have been exposed to COVID-19. °Think about the people you have recently been around °If you test positive and are diagnosed with COVID-19, someone from the health department may call to check-in on your health, discuss who you have been around, and ask where you spent time while you may have been able to spread COVID-19 to others. This form can help you think about people you have recently been around so you will be ready if a public health worker calls you. °Things to think about. Have you: °· Gone to work or school? °· Gotten together with others (eaten out at a restaurant, gone out for drinks, exercised with others or gone to a gym, had friends or family over to your house, volunteered, gone to a party, pool, or park)? °· Gone to a store in person (e.g., grocery store, mall)? °· Gone to in-person appointments (e.g., salon, barber, doctor's or dentist's office)? °· Ridden in a car with others (e.g., Uber or Lyft) or took public transportation? °· Been inside a church, synagogue, mosque or other places   of worship? °Who lives with you? °· ______________________________________________________________________ °· ______________________________________________________________________ °· ______________________________________________________________________ °· ______________________________________________________________________ °Who have you been around (within 6 feet for more than 15 minutes) in the last 10 days? (You may have more people to list than the space provided. If so, write on the  front of this sheet or a separate piece of paper.) °Name ______________________________________________ °· Phone number ____________________________________ °· Date you last saw them _____________________________ °· Where you last saw them ________________________________________________ °Name ______________________________________________ °· Phone number ____________________________________ °· Date you last saw them _____________________________ °· Where you last saw them ________________________________________________ °Name ______________________________________________ °· Phone number ____________________________________ °· Date you last saw them _____________________________ °· Where you last saw them ________________________________________________ °Name ______________________________________________ °· Phone number ____________________________________ °· Date you last saw them _____________________________ °· Where you last saw them ________________________________________________ °Name ______________________________________________ °· Phone number ____________________________________ °· Date you last saw them _____________________________ °· Where you last saw them ________________________________________________ °What have you done in the last 10 days with other people? °Activity _____________________________________________ °· Location _________________________________________ °· Date ____________________________________________ °Activity _____________________________________________ °· Location _________________________________________ °· Date ____________________________________________ °Activity _____________________________________________ °· Location _________________________________________ °· Date ____________________________________________ °Activity _____________________________________________ °· Location _________________________________________ °· Date  ____________________________________________ °Activity _____________________________________________ °· Location _________________________________________ °· Date ____________________________________________ °cdc.gov/coronavirus °10/16/2018 °This information is not intended to replace advice given to you by your health care provider. Make sure you discuss any questions you have with your health care provider. °Document Revised: 02/22/2019 Document Reviewed: 02/22/2019 °Elsevier Patient Education © 2020 [employer] Inc. ° °

## 2019-12-18 LAB — NOVEL CORONAVIRUS, NAA: SARS-CoV-2, NAA: DETECTED — AB

## 2019-12-18 LAB — SARS-COV-2, NAA 2 DAY TAT

## 2019-12-19 ENCOUNTER — Telehealth: Payer: Self-pay | Admitting: Infectious Diseases

## 2019-12-19 DIAGNOSIS — U071 COVID-19: Secondary | ICD-10-CM | POA: Diagnosis not present

## 2019-12-19 NOTE — Telephone Encounter (Signed)
[name] called me back.  He lives in [place] would prefer to have treatment at the [facility] in [place].  I will reach out to [facility] to see if they have any same day or next day availability.  Otherwise we can arrange for early morning on Thursday morning with transportation assistance.

## 2019-12-19 NOTE — Telephone Encounter (Signed)
Called to Discuss with patient about Covid symptoms and the use of the monoclonal antibody infusion for those with mild to moderate Covid symptoms and at a high risk of hospitalization.     Pt appears to qualify for this infusion due to co-morbid conditions and/or a member of an at-risk group in accordance with the FDA Emergency Use Authorization.    Unable to reach pt - LVM and sent MyChart message  Based on recent visit he is probably on day 9 now and needs treatment by tomorrow at the latest for benefit.  Multiple qualifiers for infusion    Janene Madeira, MSN, NP-C [facility] [email or web address] Pager: [phone] Office: [phone] [place]: [phone]

## 2019-12-27 ENCOUNTER — Telehealth (HOSPITAL_COMMUNITY): Payer: Self-pay | Admitting: *Deleted

## 2019-12-27 NOTE — Telephone Encounter (Signed)
Call from patient asking to speak with Dr Toy Care but didn't leave on the VM what medicine of what his conern was. His number is [phone]. Will let Dr Toy Care know of his request.

## 2019-12-27 NOTE — Telephone Encounter (Signed)
Called and spoke with the patient on the phone.  Patient stated that he is still not sleeping well at night.  He asked if he should go back to taking the Prozac at nighttime to see if that will help. He then asked if he should switch back to olanzapine because Seroquel is not helping much.  He also added that Seroquel is quite expensive due to the co-pay.  Writer reminded him that he was originally taking Seroquel and was sleeping okay however he requested to be switched to olanzapine due to co-pay being lower with olanzapine.  And then after starting olanzapine he couldn't sleep well.  And that is the reason why he was switched back to Seroquel. Patient said he remembered that but he wasn't sure what to do now.  Writer suggested that he can try taking 2 tablets of 25 mg strength together at bedtime to see if that would help him better with sleep.  Patient stated that he was agreeable to trying that.

## 2019-12-28 ENCOUNTER — Ambulatory Visit: Payer: Medicare Other

## 2019-12-28 DIAGNOSIS — Z23 Encounter for immunization: Secondary | ICD-10-CM | POA: Diagnosis not present

## 2019-12-28 DIAGNOSIS — U071 COVID-19: Secondary | ICD-10-CM | POA: Diagnosis not present

## 2020-01-07 DIAGNOSIS — D631 Anemia in chronic kidney disease: Secondary | ICD-10-CM | POA: Diagnosis not present

## 2020-01-07 DIAGNOSIS — I10 Essential (primary) hypertension: Secondary | ICD-10-CM | POA: Diagnosis not present

## 2020-01-07 DIAGNOSIS — N184 Chronic kidney disease, stage 4 (severe): Secondary | ICD-10-CM | POA: Diagnosis not present

## 2020-01-07 DIAGNOSIS — N2581 Secondary hyperparathyroidism of renal origin: Secondary | ICD-10-CM | POA: Diagnosis not present

## 2020-01-07 DIAGNOSIS — R809 Proteinuria, unspecified: Secondary | ICD-10-CM | POA: Diagnosis not present

## 2020-01-14 ENCOUNTER — Telehealth (HOSPITAL_COMMUNITY): Payer: Self-pay | Admitting: Psychiatry

## 2020-01-14 ENCOUNTER — Telehealth (HOSPITAL_COMMUNITY): Payer: Self-pay | Admitting: *Deleted

## 2020-01-14 NOTE — Telephone Encounter (Signed)
Called wanting to speak with Dr Toy Care. He did not indicate to me what he wanted to speak with her about. I will let her know he would like her to call tomorrow as she is out of the office today.

## 2020-01-15 MED ORDER — OLANZAPINE 2.5 MG PO TABS: ORAL_TABLET | ORAL | 2 refills | Status: DC

## 2020-01-15 NOTE — Addendum Note (Signed)
Addended by: [place] Crane on: 01/15/2020 12:14 PM   Modules accepted: Orders

## 2020-01-15 NOTE — Telephone Encounter (Signed)
I called and spoke with the pt. He informed that Quetiapine has not been helping his sleep. He has tried taking the Prozac with Quetiapine at bedtime and also at different times but nothing has helped. He also c/o excessive dryness of mouth which is frustrating to him. He asked what could be the cause of that. He was informed that Quetiapine is most likely the cause for that. He stated that since Quetiapine is at a high tier as per his insurance and he has a higher co-pay for that he wants to re-try the Olanzapine. He has plenty of pills of olanzapine 2.5 mg strength left. Writer advised him to try taking 2 tabs of olanzapine (5 mg ) at bedtime to see if that helps him with sleep. He is scheduled for f/up next week. Writer asked if he wants to move the appt date to a month later to give olanzapine higher dose some time, he agreed. Appt was rescheduled to December 1 at 3:20 pm.

## 2020-01-22 ENCOUNTER — Telehealth (HOSPITAL_COMMUNITY): Payer: Medicare Other | Admitting: Psychiatry

## 2020-01-22 DIAGNOSIS — H353131 Nonexudative age-related macular degeneration, bilateral, early dry stage: Secondary | ICD-10-CM | POA: Diagnosis not present

## 2020-01-22 DIAGNOSIS — H2513 Age-related nuclear cataract, bilateral: Secondary | ICD-10-CM | POA: Diagnosis not present

## 2020-02-18 ENCOUNTER — Other Ambulatory Visit: Payer: Self-pay

## 2020-02-18 ENCOUNTER — Ambulatory Visit (INDEPENDENT_AMBULATORY_CARE_PROVIDER_SITE_OTHER): Payer: Medicare Other | Admitting: Family Medicine

## 2020-02-18 DIAGNOSIS — Z23 Encounter for immunization: Secondary | ICD-10-CM | POA: Diagnosis not present

## 2020-02-18 NOTE — Progress Notes (Signed)
Patient presents today for High dose flu vaccinations

## 2020-02-19 ENCOUNTER — Other Ambulatory Visit: Payer: Self-pay | Admitting: Family Medicine

## 2020-02-19 ENCOUNTER — Other Ambulatory Visit: Payer: Self-pay | Admitting: Psychiatry

## 2020-02-19 DIAGNOSIS — I10 Essential (primary) hypertension: Secondary | ICD-10-CM

## 2020-02-19 MED ORDER — AMLODIPINE BESYLATE 10 MG PO TABS: 10.0000 mg | ORAL_TABLET | Freq: Every day | ORAL | 1 refills | Status: DC

## 2020-02-19 NOTE — Telephone Encounter (Signed)
Pt request refill   amLODipine (NORVASC) 10 MG tablet   90 day  [facility], [place] to [facility] [place] [facility] Phone:  [phone]  Fax:  [phone]

## 2020-02-20 ENCOUNTER — Other Ambulatory Visit: Payer: Self-pay

## 2020-02-20 ENCOUNTER — Encounter (HOSPITAL_COMMUNITY): Payer: Self-pay | Admitting: Psychiatry

## 2020-02-20 ENCOUNTER — Telehealth (INDEPENDENT_AMBULATORY_CARE_PROVIDER_SITE_OTHER): Payer: Medicare Other | Admitting: Psychiatry

## 2020-02-20 DIAGNOSIS — F3176 Bipolar disorder, in full remission, most recent episode depressed: Secondary | ICD-10-CM

## 2020-02-20 MED ORDER — FLUOXETINE HCL 10 MG PO CAPS: 10.0000 mg | ORAL_CAPSULE | Freq: Every day | ORAL | 1 refills | Status: DC

## 2020-02-20 MED ORDER — TRAZODONE HCL 100 MG PO TABS: 100.0000 mg | ORAL_TABLET | Freq: Every day | ORAL | 0 refills | Status: DC

## 2020-02-20 MED ORDER — OLANZAPINE 5 MG PO TABS: 5.0000 mg | ORAL_TABLET | Freq: Every day | ORAL | 0 refills | Status: DC

## 2020-02-20 NOTE — Progress Notes (Signed)
[place] MD OP Progress Note  Virtual Visit via Telephone Note  I connected with Luis Coss. on 02/20/20 at  3:20 PM EST by telephone and verified that I am speaking with the correct person using two identifiers.  Location: Patient: home Provider: Clinic   I discussed the limitations, risks, security and privacy concerns of performing an evaluation and management service by telephone and the availability of in person appointments. I also discussed with the patient that there may be a patient responsible charge related to this service. The patient expressed understanding and agreed to proceed.   I provided 16 minutes of non-face-to-face time during this encounter.   02/20/2020 3:31 PM Luis Coss.  MRN:  [id number]  Chief Complaint:  " I am still waking up 2-3 times during the night."  HPI: Patient had contacted the writer's office a few weeks ago to report that he was still not sleeping well with the help of olanzapine 2.5 mg at bedtime and trazodone 50 mg at bedtime.  At that time writer had recommended increasing the dose of olanzapine to 5 mg at bedtime to see if that would help him sleep better.  Today patient was contacted for follow-up by telephone.  Patient stated that his mood has been stable with the help of olanzapine however he still not sleeping well.  He stated that he still keeps waking up 2-3 times during the night and he does not know what else to do for that.  He also reported that he still has dryness of mouth which is still not improved after he stopped taking the Seroquel. He then asked if he can switch his psychiatrist.  He stated that he knows that he will still be talking to them on the phone and will be able to see them in their office as per his preference but maybe they can help him better with his sleep. Writer respects his request.  Writer informed him that office staff from the other clinic will be contacting him for an appointment with the other psychiatry  provider Dr. Shea Evans. In the interim writer recommended that we try a higher dose of trazodone.  He agreed to do that. As per his request, new prescriptions for olanzapine 5 mg at bedtime and trazodone 100 mg at bedtime were sent to [place].   Visit Diagnosis:    ICD-10-CM   1. Bipolar disorder, in full remission, most recent episode depressed ([place])  F31.76 OLANZapine (ZYPREXA) 5 MG tablet    traZODone (DESYREL) 100 MG tablet    Past Psychiatric History: Bipolar disorder  Past Medical History:  Past Medical History:  Diagnosis Date  . Anemia   . Apnea, sleep   . Arthritis   . Bipolar affective ([place])   . Cancer ([place]) malignant melanoma  . CRI (chronic renal insufficiency)   . Crohn's disease ([place])   . Depression   . Diabetes insipidus ([place])   . Erectile dysfunction   . Gout   . Hyperlipidemia   . Hypertension   . IBS (irritable bowel syndrome)   . Impotence   . Internal hemorrhoids   . 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[place])   . Paraphimosis   . Peyronie disease   . Secondary hyperparathyroidism of renal origin ([place])   . Skin cancer   . Testicular hypofunction   . Urinary frequency   . Urinary hesitancy     Past Surgical History:  Procedure Laterality Date  . arm fracture    .  BONE MARROW BIOPSY    . COLONOSCOPY WITH PROPOFOL N/A 12/23/2014   Procedure: COLONOSCOPY WITH PROPOFOL;  Surgeon: Manya Silvas, MD;  Location: [facility] ENDOSCOPY;  Service: Endoscopy;  Laterality: N/A;  . COLONOSCOPY WITH PROPOFOL N/A 02/06/2018   Procedure: COLONOSCOPY WITH PROPOFOL;  Surgeon: Manya Silvas, MD;  Location: [facility] ENDOSCOPY;  Service: Endoscopy;  Laterality: N/A;  . KNEE ARTHROPLASTY Left 02/22/2018   Procedure: COMPUTER ASSISTED TOTAL KNEE ARTHROPLASTY;  Surgeon: Dereck Leep, MD;  Location: [facility] ORS;  Service: Orthopedics;  Laterality: Left;  Marland Kitchen MELANOMA EXCISION    . PENILE PROSTHESIS IMPLANT    . PENILE PROSTHESIS IMPLANT N/A 08/25/2015   Procedure:  REPLACEMENT OF INFLATABLE PENILE PROSTHESIS COMPONENTS;  Surgeon: Cleon Gustin, MD;  Location: [facility] ORS;  Service: Urology;  Laterality: N/A;  . SKIN CANCER EXCISION     nose  . TONSILLECTOMY    . VASECTOMY      Family Psychiatric History: see below  Family History:  Family History  Problem Relation Age of Onset  . Depression Sister   . Bipolar disorder Other   . Prostate cancer Neg Hx   . Kidney cancer Neg Hx   . Bladder Cancer Neg Hx     Social History:  Social History   Socioeconomic History  . Marital status: Divorced    Spouse name: Not on file  . Number of children: 2  . Years of education: Not on file  . Highest education level: High school graduate  Occupational History  . Occupation: retired  Tobacco Use  . Smoking status: Former Smoker    Types: Cigarettes    Quit date: 09/08/1972    Years since quitting: 47.4  . Smokeless tobacco: Never Used  Vaping Use  . Vaping Use: Never used  Substance and Sexual Activity  . Alcohol use: Not Currently    Alcohol/week: 0.0 standard drinks    Comment: Occasional glass of wine  . Drug use: No  . Sexual activity: Yes    Partners: Female    Birth control/protection: None  Other Topics Concern  . Not on file  Social History Narrative  . Not on file   Social Determinants of Health   Financial Resource Strain: Low Risk   . Difficulty of Paying Living Expenses: Not hard at all  Food Insecurity: No Food Insecurity  . Worried About Charity fundraiser in the Last Year: Never true  . Ran Out of Food in the Last Year: Never true  Transportation Needs: No Transportation Needs  . Lack of Transportation (Medical): No  . Lack of Transportation (Non-Medical): No  Physical Activity: Sufficiently Active  . Days of Exercise per Week: 7 days  . Minutes of Exercise per Session: 30 min  Stress: No Stress Concern Present  . Feeling of Stress : Not at all  Social Connections:   . Frequency of Communication with Friends and  Family: Not on file  . Frequency of Social Gatherings with Friends and Family: Not on file  . Attends Religious Services: Not on file  . Active Member of Clubs or Organizations: Not on file  . Attends Archivist Meetings: Not on file  . Marital Status: Not on file    Allergies:  Allergies  Allergen Reactions  . Mirtazapine Rash  . Aspirin     Other reaction(s): Unknown Patient was instructed not to take aspirin but can not remember why  . Atomoxetine Other (See Comments)    Urinary sx  .  Strattera [Atomoxetine Hcl] Other (See Comments)    Urinary sx  . Xyzal [Levocetirizine Dihydrochloride] Rash    Patient reported on 03/23/70    Metabolic Disorder Labs: No results found for: HGBA1C, MPG No results found for: PROLACTIN Lab Results  Component Value Date   CHOL 202 (H) 04/26/2019   TRIG 176 (H) 04/26/2019   HDL 41 04/26/2019   CHOLHDL 3.9 04/24/2018   VLDL 50 (H) 10/03/2017   LDLCALC 130 (H) 04/26/2019   LDLCALC 96 04/24/2018   Lab Results  Component Value Date   TSH 1.226 08/01/2019   TSH 1.060 04/26/2019    Therapeutic Level Labs: Lab Results  Component Value Date   LITHIUM 0.70 08/06/2019   LITHIUM 0.78 08/05/2019   No results found for: VALPROATE No components found for:  CBMZ  Current Medications: Current Outpatient Medications  Medication Sig Dispense Refill  . allopurinol (ZYLOPRIM) 100 MG tablet Take 200 mg by mouth daily.     Marland Kitchen amLODipine (NORVASC) 10 MG tablet Take 1 tablet (10 mg total) by mouth daily. 90 tablet 1  . benazepril (LOTENSIN) 40 MG tablet Take 40 mg by mouth daily.     . colchicine 0.6 MG tablet Take 0.5 tablets (0.3 mg total) by mouth daily as needed. 5 tablet 0  . cyproheptadine (PERIACTIN) 4 MG tablet Take one tablet three hours prior to intercourse 90 tablet 3  . FLUoxetine (PROZAC) 10 MG capsule Take 1 capsule (10 mg total) by mouth daily. 90 capsule 1  . guaiFENesin (MUCINEX) 600 MG 12 hr tablet Take 1 tablet (600 mg  total) by mouth 2 (two) times daily as needed for cough or to loosen phlegm. 30 tablet 0  . hydrALAZINE (APRESOLINE) 100 MG tablet Take 100 mg by mouth 3 (three) times daily.    Marland Kitchen loperamide (IMODIUM) 2 MG capsule Take 2 mg by mouth as needed for diarrhea or loose stools.    . Multiple Vitamin (MULTIVITAMIN) tablet Take 1 tablet by mouth daily.    Marland Kitchen OLANZapine (ZYPREXA) 5 MG tablet Take 1 tablet (5 mg total) by mouth at bedtime. 90 tablet 0  . traZODone (DESYREL) 100 MG tablet Take 1 tablet (100 mg total) by mouth at bedtime. 90 tablet 0  . Vitamin D, Cholecalciferol, 25 MCG (1000 UT) TABS Take 1,000 tablets by mouth daily.      No current facility-administered medications for this visit.      Psychiatric Specialty Exam: ROS  There were no vitals taken for this visit.There is no height or weight on file to calculate BMI.  General Appearance: unable to assess due to phone visit  Eye Contact:  Unable to assess due to phone visit  Speech:  Clear and Coherent and Normal Rate  Volume:  Normal  Mood:  Euthymic  Affect:  Congruent  Thought Process:  Goal Directed, Linear and Descriptions of Associations: Intact  Orientation:  Full (Time, Place, and Person)  Thought Content: Logical, Rumination and fixated on seroquel   Suicidal Thoughts:  No  Homicidal Thoughts:  No  Memory:  Recent;   Good Remote;   Good  Judgement:  Fair  Insight:  Fair  Psychomotor Activity:  Normal  Concentration:  Concentration: Good and Attention Span: Fair  Recall:  Good  Fund of Knowledge: Good  Language: Good  Akathisia:  Negative  Handed:  Right  AIMS (if indicated): not done due to phone visit  Assets:  Communication Skills Desire for Improvement Financial Resources/Insurance  ADL's:  Intact  Cognition: WNL  Sleep:  Poor, wakes up 2-3 times per night   Screenings: Mini-Mental     [facility]  Total Score (max 30 points ) 28    PHQ2-9     Clinical  Support from 11/30/2019 in [facility] [employer] from 11/20/2018 in [place] Visit from 04/24/2018 in [facility] from 11/14/2017 in [place] Visit from 04/05/2017 in [place]  PHQ-2 Total Score 0 0 0 0 0  PHQ-9 Total Score -- -- 3 -- --       Assessment and Plan: 74 year old male with history of bipolar disorder and insomnia now contacted by phone for follow-up.  Patient continues to complain of poor sleep as evidenced by waking up several times during the night.  He was to talk to a different psychiatrist to see if they can help him better regarding this.  1. Bipolar disorder, in full remission, most recent episode depressed ([facility])  - OLANZapine (ZYPREXA) 5 MG tablet; Take 1 tablet (5 mg total) by mouth at bedtime.  Dispense: 90 tablet; Refill: 0 - Increase traZODone (DESYREL) 100 MG tablet; Take 1 tablet (100 mg total) by mouth at bedtime.  Dispense: 90 tablet; Refill: 0 - FLUoxetine (PROZAC) 10 MG capsule; Take 1 capsule (10 mg total) by mouth daily.  Dispense: 90 capsule; Refill: 1  Patient wants to switch his psychiatrist as he wants to see if the other psychiatrist can prescribe him something that can work better for his sleeping patterns.  We will have the office staff contact him for an appointment with Dr. Shea Evans. Pt is scheduled to see his nephrologist on December 16. F/up in 3 months with psychiatry.   [place] Crane, MD 02/20/2020, 3:31 PM

## 2020-03-06 DIAGNOSIS — N2581 Secondary hyperparathyroidism of renal origin: Secondary | ICD-10-CM | POA: Diagnosis not present

## 2020-03-06 DIAGNOSIS — I10 Essential (primary) hypertension: Secondary | ICD-10-CM | POA: Diagnosis not present

## 2020-03-06 DIAGNOSIS — D631 Anemia in chronic kidney disease: Secondary | ICD-10-CM | POA: Diagnosis not present

## 2020-03-06 DIAGNOSIS — N184 Chronic kidney disease, stage 4 (severe): Secondary | ICD-10-CM | POA: Diagnosis not present

## 2020-03-06 DIAGNOSIS — R809 Proteinuria, unspecified: Secondary | ICD-10-CM | POA: Diagnosis not present

## 2020-03-13 DIAGNOSIS — D225 Melanocytic nevi of trunk: Secondary | ICD-10-CM | POA: Diagnosis not present

## 2020-03-13 DIAGNOSIS — D2272 Melanocytic nevi of left lower limb, including hip: Secondary | ICD-10-CM | POA: Diagnosis not present

## 2020-03-13 DIAGNOSIS — D2271 Melanocytic nevi of right lower limb, including hip: Secondary | ICD-10-CM | POA: Diagnosis not present

## 2020-03-13 DIAGNOSIS — D2261 Melanocytic nevi of right upper limb, including shoulder: Secondary | ICD-10-CM | POA: Diagnosis not present

## 2020-03-13 DIAGNOSIS — D485 Neoplasm of uncertain behavior of skin: Secondary | ICD-10-CM | POA: Diagnosis not present

## 2020-03-13 DIAGNOSIS — L821 Other seborrheic keratosis: Secondary | ICD-10-CM | POA: Diagnosis not present

## 2020-03-13 DIAGNOSIS — D2262 Melanocytic nevi of left upper limb, including shoulder: Secondary | ICD-10-CM | POA: Diagnosis not present

## 2020-03-17 DIAGNOSIS — H52223 Regular astigmatism, bilateral: Secondary | ICD-10-CM | POA: Diagnosis not present

## 2020-03-20 NOTE — Telephone Encounter (Signed)
Patient has since spoken with provider. Encounter being closed at this time.

## 2020-03-26 ENCOUNTER — Telehealth: Payer: Medicare Other | Admitting: Psychiatry

## 2020-03-26 DIAGNOSIS — Z23 Encounter for immunization: Secondary | ICD-10-CM | POA: Diagnosis not present

## 2020-04-02 DIAGNOSIS — I10 Essential (primary) hypertension: Secondary | ICD-10-CM | POA: Diagnosis not present

## 2020-04-02 DIAGNOSIS — H2511 Age-related nuclear cataract, right eye: Secondary | ICD-10-CM | POA: Diagnosis not present

## 2020-04-02 NOTE — Progress Notes (Signed)
03/30/2019 7:50 PM   [name] [birth year] [id number]  Referring provider: Volney American, PA-C [address] [place],  [place] [address]  Chief Complaint  Patient presents with  . Hematuria    Urological history 1. Elevated PSA - negative biopsy in 2002 for PSA of 4.03 - PSA trend Component     Latest Ref Rng & Units 06/03/2015 11/11/2015 05/31/2016 12/06/2016  Prostate Specific Ag, Serum     0.0 - 4.0 ng/mL 5.5 (H) 6.4 (H) 6.4 (H) 5.6 (H)   Component     Latest Ref Rng & Units 04/05/2017 06/13/2017 12/20/2017 06/20/2018  Prostate Specific Ag, Serum     0.0 - 4.0 ng/mL 7.1 (H) 6.4 (H) 5.7 (H) 6.6 (H)   Component     Latest Ref Rng & Units 09/26/2018 03/27/2019 09/26/2019  Prostate Specific Ag, Serum     0.0 - 4.0 ng/mL 5.8 (H) 5.9 (H) 4.6 (H)   2. ED - IPP placement for a malfunction IPP on 08/25/2015  3. BPH with LU TS - I PSS: 6/4  4. High Risk Hematuria - Former smoker - work up with RUS , non-contrast CT (due to CKD), cysto and cytology noted bilateral simple renal cysts and prominent lateral lobe enlargement with hypervascularity/friability prostate with elevated bladder neck - No gross hematuria - UA negative for micro heme   HPI: Mr. Sagan is a 75 year old male who presents today for a 6 month follow up.  His biggest complaint is urinary frequency and nocturia which he attributes to his increase in fluid intake.  He states he was instructed by his nephrologist to drink a lot of water.  He states he is not certain how many ounces he is to drink and he is not certain how many ounces he actually drinks during the day.  He states he just has a jug of water with him constantly and is always sipping.  He also is having nocturia x3.  He states his psychiatrist has tried medication adjustments which have not been helpful.  He states he is used to it now and is able to fall asleep after he urinates.  He is compliant and CPAP use.  Patient denies any modifying or  aggravating factors.  Patient denies any gross hematuria, dysuria or suprapubic/flank pain.  Patient denies any fevers, chills, nausea or vomiting.    PMH: Past Medical History:  Diagnosis Date  . Anemia   . Apnea, sleep    CPAP  . Arthritis   . Bipolar affective ([place])   . Cancer ([place]) malignant melanoma  . COVID-19 12/17/2019  . CRI (chronic renal insufficiency)   . Crohn's disease ([place])   . Depression   . Diabetes insipidus ([place])   . Erectile dysfunction   . Gout   . Hyperlipidemia   . Hypertension   . IBS (irritable bowel syndrome)   . Impotence   . Internal hemorrhoids   . 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[place])   . Paraphimosis   . Peyronie disease   . Secondary hyperparathyroidism of renal origin ([place])   . Skin cancer   . Testicular hypofunction   . Urinary frequency   . Urinary hesitancy   . Wears hearing aid in both ears     Surgical History: Past Surgical History:  Procedure Laterality Date  . arm fracture    . BONE MARROW BIOPSY    . CATARACT EXTRACTION W/PHACO Right 04/09/2020   Procedure: CATARACT EXTRACTION PHACO AND INTRAOCULAR  LENS PLACEMENT (IOC) RIGHT EYHANCE TORIC 6.84 01:03.7 10.7%;  Surgeon: Leandrew Koyanagi, MD;  Location: [facility];  Service: Ophthalmology;  Laterality: Right;  sleep apnea  . COLONOSCOPY WITH PROPOFOL N/A 12/23/2014   Procedure: COLONOSCOPY WITH PROPOFOL;  Surgeon: Manya Silvas, MD;  Location: [facility] ENDOSCOPY;  Service: Endoscopy;  Laterality: N/A;  . COLONOSCOPY WITH PROPOFOL N/A 02/06/2018   Procedure: COLONOSCOPY WITH PROPOFOL;  Surgeon: Manya Silvas, MD;  Location: [facility] ENDOSCOPY;  Service: Endoscopy;  Laterality: N/A;  . KNEE ARTHROPLASTY Left 02/22/2018   Procedure: COMPUTER ASSISTED TOTAL KNEE ARTHROPLASTY;  Surgeon: Dereck Leep, MD;  Location: [facility] ORS;  Service: Orthopedics;  Laterality: Left;  Marland Kitchen MELANOMA EXCISION    . PENILE PROSTHESIS IMPLANT    . PENILE PROSTHESIS IMPLANT N/A 08/25/2015    Procedure: REPLACEMENT OF INFLATABLE PENILE PROSTHESIS COMPONENTS;  Surgeon: Cleon Gustin, MD;  Location: [facility];  Service: Urology;  Laterality: N/A;  . SKIN CANCER EXCISION     nose  . TONSILLECTOMY    . VASECTOMY      Home Medications:  Allergies as of 04/03/2020      Reactions   Mirtazapine Rash   Aspirin    Other reaction(s): Unknown Patient was instructed not to take aspirin but can not remember why   Atomoxetine Other (See Comments)   Urinary sx   Strattera [atomoxetine Hcl] Other (See Comments)   Urinary sx   Xyzal [levocetirizine Dihydrochloride] Rash   Patient reported on 11/02/17      Medication List       Accurate as of April 03, 2020 11:59 PM. If you have any questions, ask your nurse or doctor.        STOP taking these medications   guaiFENesin 600 MG 12 hr tablet Commonly known as: Mucinex Stopped by: Zara Council, PA-C     TAKE these medications   allopurinol 100 MG tablet Commonly known as: ZYLOPRIM Take 200 mg by mouth daily.   amLODipine 10 MG tablet Commonly known as: NORVASC Take 1 tablet (10 mg total) by mouth daily.   benazepril 40 MG tablet Commonly known as: LOTENSIN Take 40 mg by mouth daily.   colchicine 0.6 MG tablet Take 0.5 tablets (0.3 mg total) by mouth daily as needed.   cyproheptadine 4 MG tablet Commonly known as: PERIACTIN Take one tablet three hours prior to intercourse   FLUoxetine 10 MG capsule Commonly known as: PROZAC Take 1 capsule (10 mg total) by mouth daily.   hydrALAZINE 100 MG tablet Commonly known as: APRESOLINE Take 100 mg by mouth 3 (three) times daily.   loperamide 2 MG capsule Commonly known as: IMODIUM Take 2 mg by mouth as needed for diarrhea or loose stools.   multivitamin tablet Take 1 tablet by mouth daily.   OLANZapine 5 MG tablet Commonly known as: ZyPREXA Take 1 tablet (5 mg total) by mouth at bedtime.   traZODone 100 MG tablet Commonly known as: DESYREL Take 1 tablet (100  mg total) by mouth at bedtime.   Vitamin D (Cholecalciferol) 25 MCG (1000 UT) Tabs Take 1,000 tablets by mouth daily.       Allergies:  Allergies  Allergen Reactions  . Mirtazapine Rash  . Aspirin     Other reaction(s): Unknown Patient was instructed not to take aspirin but can not remember why  . Atomoxetine Other (See Comments)    Urinary sx  . Strattera [Atomoxetine Hcl] Other (See Comments)    Urinary sx  .  Xyzal [Levocetirizine Dihydrochloride] Rash    Patient reported on 11/02/17    Family History: Family History  Problem Relation Age of Onset  . Depression Sister   . Bipolar disorder Other   . Prostate cancer Neg Hx   . Kidney cancer Neg Hx   . Bladder Cancer Neg Hx     Social History:  reports that he quit smoking about 47 years ago. His smoking use included cigarettes. He has never used smokeless tobacco. He reports previous alcohol use. He reports that he does not use drugs.  ROS: For pertinent review of systems please refer to history of present illness  Physical Exam: BP (!) 148/79   Pulse (!) 103   Ht 5' 8" (1.727 m)   Wt 185 lb (83.9 kg)   BMI 28.13 kg/m   Constitutional:  Well nourished. Alert and oriented, No acute distress. HEENT: [place] AT, mask in place.  Trachea midline Cardiovascular: No clubbing, cyanosis, or edema. Respiratory: Normal respiratory effort, no increased work of breathing. GU: No CVA tenderness.  No bladder fullness or masses.  Patient with uncircumcised phallus.  Foreskin easily retracted.   Urethral meatus is patent.  No penile discharge. No penile lesions or rashes.  Prosthetic cylinders are in place.  Scrotum without lesions, cysts, rashes and/or edema.  Prosthetic pump is in place.  Testicles are located scrotally bilaterally. No masses are appreciated in the testicles. Left and right epididymis are normal. Rectal: Patient with  normal sphincter tone. Anus and perineum without scarring or rashes. No rectal masses are appreciated.  Prostate is approximately 60 grams, could only palpate the apex in the midportion of the gland, no nodules are appreciated. Seminal vesicles could not be palpated Neurologic: Grossly intact, no focal deficits, moving all 4 extremities. Psychiatric: Normal mood and affect.   Laboratory Data: Specimen:  Blood - Blood, Venous  Ref Range & Units 3 wk ago Comments  Glucose 65 - 99 mg/dL 91                                                Fasting reference interval                        BUN 7 - 25 mg/dL 45High    Creatinine 0.70 - 1.18 mg/dL 3.83High    For patients >53 years of age, the reference limit    for Creatinine is approximately 13% higher for people    identified as African-American.      eGFR Non-African > OR = 60 mL/min/1.51m 14Low    eGFR African > OR = 60 mL/min/1.757m17Low    BUN/Creatinine Ratio 6 - 22 (calc) 12    Sodium 135 - 146 mmol/L 145                     Verified by repeat analysis.                        Potassium 3.5 - 5.3 mmol/L 3.7    Chloride 98 - 110 mmol/L 109    Bicarbonate (CO2) 20 - 32 mmol/L 28    Calcium 8.6 - 10.3 mg/dL 9.4    Phosphorus 2.1 - 4.3 mg/dL 4.2    Albumin 3.6 - 5.1 g/dL 4.0    Resulting Agency  [facility]    Resulting Agency Comment  Performing Organization Information:   Site ID: [id number]   Name: [facility]   Address: [address], [address] [place], [place] [address]   Director: Cathleen Fears [place] Specimen Collected: 03/06/20 1:36 PM Last Resulted: 03/07/20 12:58 PM  Received From: [facility]  Result Received: 03/20/20 4:46 PM   Specimen:  Blood - Blood, Venous  Ref Range & Units 3 wk ago Comments  WBC 3.8 - 10.8 Thousand/uL 3.5Low    RBC 4.20 - 5.80 Million/uL 3.17Low    Hemoglobin 13.2 - 17.1 g/dL 10.9Low    Hematocrit 38.5 - 50.0 % 31.9Low    MCV 80.0  - 100.0 fL 100.6High    MCH 27.0 - 33.0 pg 34.4High    MCHC 32.0 - 36.0 g/dL 34.2    RDW 11.0 - 15.0 % 13.8    Platelets 140 - 400 Thousand/uL 144    MPV 7.5 - 12.5 fL 10.3    Neutrophils Absolute 1500 - 7800 cells/uL 1904    Band Neutrophils Absolute, Manual Count 0 - 750 cells/uL CANCELED  Result canceled by the ancillary.  Metamyelocytes Absolute 0 cells/uL CANCELED  Result canceled by the ancillary.  Absolute Myelocytes 0 cells/uL CANCELED  Result canceled by the ancillary.  Absolute Promyelocytes 0 cells/uL CANCELED  Result canceled by the ancillary.  Lymphocytes Absolute 850 - 3900 cells/uL 938    Monocytes Absolute 200 - 950 cells/uL 438    Eosinophils Absolute 15 - 500 cells/uL 200    Basophils Absolute 0 - 200 cells/uL 21    Blasts Absolute 0 cells/uL CANCELED  Result canceled by the ancillary.  NRBC Absolute 0 cells/uL CANCELED  Result canceled by the ancillary.  Neutrophils Relative % 54.4    Bands Absolute % CANCELED  Result canceled by the ancillary.  Metamyelocytes Percent % CANCELED  Result canceled by the ancillary.  Myelocytes Relative % CANCELED  Result canceled by the ancillary.  Promyelocytes Relative % CANCELED  Result canceled by the ancillary.  Lymphocytes % 26.8    Variant lymphocytes/100 WBC (Bld) 0 - 10 % CANCELED  Result canceled by the ancillary.  Monocytes % 12.5    Eosinophils % 5.7    Basophils Relative % 0.6    Blasts % CANCELED  Result canceled by the ancillary.  nRBC 0 /100 WBC CANCELED  Result canceled by the ancillary.  Comment(s)  CANCELED  Result canceled by the ancillary.  Resulting Agency  [facility]    Resulting Agency Comment  Performing Organization Information:   Site ID: [id number]   Name: [facility]   Address: [address], [address] [place], [place] [address]   Director: Cathleen Fears [place] Specimen Collected: 03/06/20 1:36 PM Last Resulted: 03/07/20 12:58 PM  Received From: [facility]  Result  Received: 03/20/20 4:46 PM     PSA History  5.6 ng/mL on 11/27/2013  4.9 ng/mL on 05/28/2014   Component     Latest Ref Rng & Units 09/09/2014 06/03/2015 11/11/2015 05/31/2016  Prostate Specific Ag, Serum     0.0 - 4.0 ng/mL 5.7 (H) 5.5 (H) 6.4 (H) 6.4 (H)   Component     Latest Ref Rng & Units 12/06/2016 04/05/2017 06/13/2017 12/20/2017  Prostate Specific Ag, Serum     0.0 - 4.0 ng/mL 5.6 (H) 7.1 (H) 6.4 (H) 5.7 (H)   Component     Latest Ref Rng & Units 06/20/2018 09/26/2018 03/27/2019 09/26/2019  Prostate Specific Ag, Serum     0.0 -  4.0 ng/mL 6.6 (H) 5.8 (H) 5.9 (H) 4.6 (H)   Urinalysis Component     Latest Ref Rng & Units 04/03/2020  Specific Gravity, UA     1.005 - 1.030 1.010  pH, UA     5.0 - 7.5 5.5  Color, UA     Yellow Yellow  Appearance Ur     Clear Clear  Leukocytes,UA     Negative Negative  Protein,UA     Negative/Trace 2+ (A)  Glucose, UA     Negative Trace (A)  Ketones, UA     Negative Negative  RBC, UA     Negative Negative  Bilirubin, UA     Negative Negative  Urobilinogen, Ur     0.2 - 1.0 mg/dL 0.2  Nitrite, UA     Negative Negative  Microscopic Examination      See below:   Component     Latest Ref Rng & Units 04/03/2020  WBC, UA     0 - 5 /hpf 0-5  RBC     0 - 2 /hpf 0-2  Epithelial Cells (non renal)     0 - 10 /hpf None seen  Casts     None seen /lpf Present (A)  Cast Type     N/A Granular casts (A)  Bacteria, UA     None seen/Few None seen   I have reviewed the labs.  Pertinent Imaging Results for GARNER, DULLEA (MRN [id number]) as of 10/02/2019 13:20  Ref. Range 10/02/2019 11:27  Scan Result Unknown 7   Assessment & Plan:    1. 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 BPH with obstruction/lower urinary tract symptoms - I PSS: 6/4 - He is not interested in any medical therapy at this time -Return in 6 months for  IPSS and exam  4. Hematuria, unspecified type - Urinalysis, Complete  Return in about 6 months (around 10/01/2020) for IPSS, UA, PSA and exam.  These notes generated with voice recognition software. I apologize for typographical errors.  [place], [address] [address] [phone]   Zara Council, PA-C

## 2020-04-03 ENCOUNTER — Encounter: Payer: Self-pay | Admitting: Ophthalmology

## 2020-04-03 ENCOUNTER — Other Ambulatory Visit: Payer: Self-pay

## 2020-04-03 ENCOUNTER — Encounter: Payer: Self-pay | Admitting: Urology

## 2020-04-03 ENCOUNTER — Ambulatory Visit (INDEPENDENT_AMBULATORY_CARE_PROVIDER_SITE_OTHER): Payer: Medicare Other | Admitting: Urology

## 2020-04-03 VITALS — BP 148/79 | HR 103 | Ht 68.0 in | Wt 185.0 lb

## 2020-04-03 DIAGNOSIS — R319 Hematuria, unspecified: Secondary | ICD-10-CM | POA: Diagnosis not present

## 2020-04-03 DIAGNOSIS — R972 Elevated prostate specific antigen [PSA]: Secondary | ICD-10-CM

## 2020-04-03 DIAGNOSIS — N401 Enlarged prostate with lower urinary tract symptoms: Secondary | ICD-10-CM

## 2020-04-03 DIAGNOSIS — N138 Other obstructive and reflux uropathy: Secondary | ICD-10-CM

## 2020-04-03 DIAGNOSIS — N529 Male erectile dysfunction, unspecified: Secondary | ICD-10-CM | POA: Diagnosis not present

## 2020-04-04 ENCOUNTER — Telehealth: Payer: Self-pay | Admitting: Family Medicine

## 2020-04-04 ENCOUNTER — Telehealth: Payer: Medicare Other | Admitting: Psychiatry

## 2020-04-04 LAB — URINALYSIS, COMPLETE
Bilirubin, UA: NEGATIVE
Ketones, UA: NEGATIVE
Leukocytes,UA: NEGATIVE
Nitrite, UA: NEGATIVE
RBC, UA: NEGATIVE
Specific Gravity, UA: 1.01 (ref 1.005–1.030)
Urobilinogen, Ur: 0.2 mg/dL (ref 0.2–1.0)
pH, UA: 5.5 (ref 5.0–7.5)

## 2020-04-04 LAB — MICROSCOPIC EXAMINATION
Bacteria, UA: NONE SEEN
Epithelial Cells (non renal): NONE SEEN /hpf (ref 0–10)

## 2020-04-04 LAB — PSA: Prostate Specific Ag, Serum: 6.3 ng/mL — ABNORMAL HIGH (ref 0.0–4.0)

## 2020-04-04 NOTE — Telephone Encounter (Signed)
Patient notified and voiced understanding.

## 2020-04-04 NOTE — Telephone Encounter (Signed)
-----   Message from Nori Riis, PA-C sent at 04/04/2020  8:17 AM EST ----- Please let Luis Miller know that his PSA is 6.3 which is higher than it was in July at 4.6, but it was at that level 2 years ago.  We will continue to monitor it at this time every six months.

## 2020-04-07 ENCOUNTER — Other Ambulatory Visit: Payer: Self-pay

## 2020-04-07 ENCOUNTER — Other Ambulatory Visit
Admission: RE | Admit: 2020-04-07 | Discharge: 2020-04-07 | Disposition: A | Discharge status: Home / Self Care | Payer: Medicare Other | Source: Ambulatory Visit | Attending: Ophthalmology | Admitting: Ophthalmology

## 2020-04-07 DIAGNOSIS — Z01812 Encounter for preprocedural laboratory examination: Secondary | ICD-10-CM | POA: Diagnosis not present

## 2020-04-07 DIAGNOSIS — Z20822 Contact with and (suspected) exposure to covid-19: Secondary | ICD-10-CM | POA: Diagnosis not present

## 2020-04-07 LAB — SARS CORONAVIRUS 2 (TAT 6-24 HRS): SARS Coronavirus 2: NEGATIVE

## 2020-04-08 NOTE — Discharge Instructions (Signed)

## 2020-04-09 ENCOUNTER — Ambulatory Visit: Payer: Medicare Other | Admitting: Anesthesiology

## 2020-04-09 ENCOUNTER — Ambulatory Visit
Admission: RE | Admit: 2020-04-09 | Discharge: 2020-04-09 | Disposition: A | Discharge status: Home / Self Care | Payer: Medicare Other | Attending: Ophthalmology | Admitting: Ophthalmology

## 2020-04-09 ENCOUNTER — Other Ambulatory Visit: Payer: Self-pay

## 2020-04-09 ENCOUNTER — Encounter
Admission: RE | Disposition: A | Discharge status: Home / Self Care | Payer: Self-pay | Source: Home / Self Care | Attending: Ophthalmology

## 2020-04-09 DIAGNOSIS — Z886 Allergy status to analgesic agent status: Secondary | ICD-10-CM | POA: Diagnosis not present

## 2020-04-09 DIAGNOSIS — Z8616 Personal history of COVID-19: Secondary | ICD-10-CM | POA: Insufficient documentation

## 2020-04-09 DIAGNOSIS — N184 Chronic kidney disease, stage 4 (severe): Secondary | ICD-10-CM | POA: Insufficient documentation

## 2020-04-09 DIAGNOSIS — I129 Hypertensive chronic kidney disease with stage 1 through stage 4 chronic kidney disease, or unspecified chronic kidney disease: Secondary | ICD-10-CM | POA: Insufficient documentation

## 2020-04-09 DIAGNOSIS — H2511 Age-related nuclear cataract, right eye: Secondary | ICD-10-CM | POA: Insufficient documentation

## 2020-04-09 DIAGNOSIS — Z87891 Personal history of nicotine dependence: Secondary | ICD-10-CM | POA: Insufficient documentation

## 2020-04-09 DIAGNOSIS — H25811 Combined forms of age-related cataract, right eye: Secondary | ICD-10-CM | POA: Diagnosis not present

## 2020-04-09 HISTORY — DX: Presence of external hearing-aid: Z97.4

## 2020-04-09 HISTORY — PX: CATARACT EXTRACTION W/PHACO: SHX586

## 2020-04-09 SURGERY — PHACOEMULSIFICATION, CATARACT, WITH IOL INSERTION
Anesthesia: Monitor Anesthesia Care | Site: Eye | Laterality: Right

## 2020-04-09 MED ORDER — MIDAZOLAM HCL 2 MG/2ML IJ SOLN
INTRAMUSCULAR | Status: DC | PRN
  Administered 2020-04-09: 2 mg via INTRAVENOUS

## 2020-04-09 MED ORDER — BRIMONIDINE TARTRATE-TIMOLOL 0.2-0.5 % OP SOLN
OPHTHALMIC | Status: DC | PRN
  Administered 2020-04-09: 1 [drp] via OPHTHALMIC

## 2020-04-09 MED ORDER — NA HYALUR & NA CHOND-NA HYALUR 0.4-0.35 ML IO KIT
PACK | INTRAOCULAR | Status: DC | PRN
  Administered 2020-04-09: 1 mL via INTRAOCULAR

## 2020-04-09 MED ORDER — ARMC OPHTHALMIC DILATING DROPS
1.0000 "application " | OPHTHALMIC | Status: DC | PRN
  Administered 2020-04-09 (×3): 1 via OPHTHALMIC

## 2020-04-09 MED ORDER — FENTANYL CITRATE (PF) 100 MCG/2ML IJ SOLN
INTRAMUSCULAR | Status: DC | PRN
  Administered 2020-04-09 (×2): 50 ug via INTRAVENOUS

## 2020-04-09 MED ORDER — EPINEPHRINE PF 1 MG/ML IJ SOLN
INTRAOCULAR | Status: DC | PRN
  Administered 2020-04-09: 71 mL via OPHTHALMIC

## 2020-04-09 MED ORDER — CEFUROXIME OPHTHALMIC INJECTION 1 MG/0.1 ML
INJECTION | OPHTHALMIC | Status: DC | PRN
  Administered 2020-04-09: 0.1 mL via INTRACAMERAL

## 2020-04-09 MED ORDER — LACTATED RINGERS IV SOLN: INTRAVENOUS | Status: DC

## 2020-04-09 MED ORDER — TETRACAINE HCL 0.5 % OP SOLN
1.0000 [drp] | OPHTHALMIC | Status: DC | PRN
  Administered 2020-04-09 (×3): 1 [drp] via OPHTHALMIC

## 2020-04-09 MED ORDER — LIDOCAINE HCL (PF) 2 % IJ SOLN
INTRAOCULAR | Status: DC | PRN
  Administered 2020-04-09: 1 mL

## 2020-04-09 SURGICAL SUPPLY — 24 items
CANNULA ANT/CHMB 27G (MISCELLANEOUS) ×1 IMPLANT
CANNULA ANT/CHMB 27GA (MISCELLANEOUS) ×2 IMPLANT
GLOVE SURG LX 7.5 STRW (GLOVE) ×1
GLOVE SURG LX STRL 7.5 STRW (GLOVE) ×1 IMPLANT
GLOVE SURG TRIUMPH 8.0 PF LTX (GLOVE) ×2 IMPLANT
GOWN STRL REUS W/ TWL LRG LVL3 (GOWN DISPOSABLE) ×2 IMPLANT
GOWN STRL REUS W/TWL LRG LVL3 (GOWN DISPOSABLE) ×4
LENS IOL EYHANCE TORIC II 24.0 ×2 IMPLANT
LENS IOL EYHANCE TRC 225 24.0 IMPLANT
LENS IOL EYHNC TORIC 225 24.0 ×1 IMPLANT
MARKER SKIN DUAL TIP RULER LAB (MISCELLANEOUS) ×2 IMPLANT
NDL CAPSULORHEX 25GA (NEEDLE) ×1 IMPLANT
NDL FILTER BLUNT 18X1 1/2 (NEEDLE) ×2 IMPLANT
NEEDLE CAPSULORHEX 25GA (NEEDLE) ×2 IMPLANT
NEEDLE FILTER BLUNT 18X 1/2SAF (NEEDLE) ×2
NEEDLE FILTER BLUNT 18X1 1/2 (NEEDLE) ×2 IMPLANT
PACK CATARACT BRASINGTON (MISCELLANEOUS) ×2 IMPLANT
PACK EYE AFTER SURG (MISCELLANEOUS) ×2 IMPLANT
PACK OPTHALMIC (MISCELLANEOUS) ×2 IMPLANT
SOLUTION OPHTHALMIC SALT (MISCELLANEOUS) ×2 IMPLANT
SYR 3ML LL SCALE MARK (SYRINGE) ×4 IMPLANT
SYR TB 1ML LUER SLIP (SYRINGE) ×2 IMPLANT
WATER STERILE IRR 250ML POUR (IV SOLUTION) ×2 IMPLANT
WIPE NON LINTING 3.25X3.25 (MISCELLANEOUS) ×2 IMPLANT

## 2020-04-09 NOTE — H&P (Signed)

## 2020-04-09 NOTE — Anesthesia Postprocedure Evaluation (Signed)
Anesthesia Post Note  Patient: Luis Miller.  Procedure(s) Performed: CATARACT EXTRACTION PHACO AND INTRAOCULAR LENS PLACEMENT (IOC) RIGHT EYHANCE TORIC 6.84 01:03.7 10.7% (Right Eye)     Patient location during evaluation: PACU Anesthesia Type: MAC Level of consciousness: awake and alert Pain management: pain level controlled Vital Signs Assessment: post-procedure vital signs reviewed and stable Respiratory status: spontaneous breathing, nonlabored ventilation, respiratory function stable and patient connected to nasal cannula oxygen Cardiovascular status: stable and blood pressure returned to baseline Postop Assessment: no apparent nausea or vomiting Anesthetic complications: no   No complications documented.  Alisa Graff

## 2020-04-09 NOTE — Anesthesia Procedure Notes (Signed)
Procedure Name: MAC Date/Time: 04/09/2020 11:39 AM Performed by: Jeannene Patella, CRNA Pre-anesthesia Checklist: Patient identified, Emergency Drugs available, Suction available, Timeout performed and Patient being monitored Patient Re-evaluated:Patient Re-evaluated prior to induction Oxygen Delivery Method: Nasal cannula Placement Confirmation: positive ETCO2

## 2020-04-09 NOTE — Transfer of Care (Signed)
Immediate Anesthesia Transfer of Care Note  Patient: Luis Miller.  Procedure(s) Performed: CATARACT EXTRACTION PHACO AND INTRAOCULAR LENS PLACEMENT (IOC) RIGHT EYHANCE TORIC 6.84 01:03.7 10.7% (Right Eye)  Patient Location: PACU  Anesthesia Type: MAC  Level of Consciousness: awake, alert  and patient cooperative  Airway and Oxygen Therapy: Patient Spontanous Breathing and Patient connected to supplemental oxygen  Post-op Assessment: Post-op Vital signs reviewed, Patient's Cardiovascular Status Stable, Respiratory Function Stable, Patent Airway and No signs of Nausea or vomiting  Post-op Vital Signs: Reviewed and stable  Complications: No complications documented.

## 2020-04-09 NOTE — Op Note (Signed)
LOCATION:  [facility]   PREOPERATIVE DIAGNOSIS:  Nuclear sclerotic cataract of the right eye.  H25.11   POSTOPERATIVE DIAGNOSIS:  Nuclear sclerotic cataract of the right eye.   PROCEDURE:  Phacoemulsification with Toric posterior chamber intraocular lens placement of the right eye.  Ultrasound time: Procedure(s) with comments: CATARACT EXTRACTION PHACO AND INTRAOCULAR LENS PLACEMENT (IOC) RIGHT EYHANCE TORIC 6.84 01:03.7 10.7% (Right) - sleep apnea  LENS:   Implant Name Type Inv. Item Serial No. Manufacturer Lot No. LRB No. Used Action  tecnis toric II IOL Intraocular Lens  [id number]   Right 1 Implanted     [id number] 24.0 D Eyhance Toric intraocular lens with 2.25 diopters of cylindrical power with axis orientation at 132 degrees.    SURGEON:  Wyonia Hough, MD   ANESTHESIA: Topical with tetracaine drops and 2% Xylocaine jelly, augmented with 1% preservative-free intracameral lidocaine. .   COMPLICATIONS:  None.   DESCRIPTION OF PROCEDURE:  The patient was identified in the holding room and transported to the operating suite and placed in the supine position under the operating microscope.  The right eye was identified as the operative eye, and it was prepped and draped in the usual sterile ophthalmic fashion.    A clear-corneal paracentesis incision was made at the 12:00 position.  0.5 ml of preservative-free 1% lidocaine was injected into the anterior chamber. The anterior chamber was filled with Viscoat.  A 2.4 millimeter near clear corneal incision was then made at the 9:00 position.  A cystotome and capsulorrhexis forceps were then used to make a curvilinear capsulorrhexis.  Hydrodissection and hydrodelineation were then performed using balanced salt solution.   Phacoemulsification was then used in stop and chop fashion to remove the lens, nucleus and epinucleus.  The remaining cortex was aspirated using the irrigation and aspiration handpiece.  Provisc viscoelastic  was then placed into the capsular bag to distend it for lens placement.  The Verion digital marker was used to align the implant at the intended axis.   A Toric lens was then injected into the capsular bag.  It was rotated clockwise until the axis marks on the lens were approximately 15 degrees in the counterclockwise direction to the intended alignment.  The viscoelastic was aspirated from the eye using the irrigation aspiration handpiece.  Then, a Koch spatula through the sideport incision was used to rotate the lens in a clockwise direction until the axis markings of the intraocular lens were lined up with the Verion alignment.  Balanced salt solution was then used to hydrate the wounds. Cefuroxime 0.1 ml of a 70m/ml solution was injected into the anterior chamber for a dose of 1 mg of intracameral antibiotic at the completion of the case.    The eye was noted to have a physiologic pressure and there was no wound leak noted.   Timolol and Brimonidine drops were applied to the eye.  The patient was taken to the recovery room in stable condition having had no complications of anesthesia or surgery.  Luis Miller 04/09/2020, 11:57 AM

## 2020-04-09 NOTE — Anesthesia Preprocedure Evaluation (Signed)
Anesthesia Evaluation  Patient identified by MRN, date of birth, ID band Patient awake    Reviewed: Allergy & Precautions, H&P , NPO status , Patient's Chart, lab work & pertinent test results, reviewed documented beta blocker date and time   Airway Mallampati: II  TM Distance: >3 FB Neck ROM: full    Dental no notable dental hx.    Pulmonary sleep apnea and Continuous Positive Airway Pressure Ventilation , former smoker,    Pulmonary exam normal breath sounds clear to auscultation       Cardiovascular Exercise Tolerance: Good hypertension,  Rhythm:regular Rate:Normal     Neuro/Psych Depression Bipolar Disorder negative neurological ROS     GI/Hepatic Neg liver ROS, Ulcerative colitis, IBS   Endo/Other  Diabetes insipidus  Renal/GU CRF  negative genitourinary   Musculoskeletal   Abdominal   Peds  Hematology negative hematology ROS (+)   Anesthesia Other Findings   Reproductive/Obstetrics negative OB ROS                            Anesthesia Physical Anesthesia Plan  ASA: III  Anesthesia Plan: MAC   Post-op Pain Management:    Induction:   PONV Risk Score and Plan: 1 and Treatment may vary due to age or medical condition  Airway Management Planned:   Additional Equipment:   Intra-op Plan:   Post-operative Plan:   Informed Consent: I have reviewed the patients History and Physical, chart, labs and discussed the procedure including the risks, benefits and alternatives for the proposed anesthesia with the patient or authorized representative who has indicated his/her understanding and acceptance.     Dental Advisory Given  Plan Discussed with: CRNA  Anesthesia Plan Comments:        Anesthesia Quick Evaluation

## 2020-04-10 ENCOUNTER — Encounter: Payer: Self-pay | Admitting: Ophthalmology

## 2020-04-22 ENCOUNTER — Encounter: Payer: Self-pay | Admitting: Ophthalmology

## 2020-04-24 ENCOUNTER — Telehealth (INDEPENDENT_AMBULATORY_CARE_PROVIDER_SITE_OTHER): Payer: Medicare Other | Admitting: Psychiatry

## 2020-04-24 ENCOUNTER — Encounter: Payer: Self-pay | Admitting: Psychiatry

## 2020-04-24 ENCOUNTER — Other Ambulatory Visit: Payer: Self-pay

## 2020-04-24 DIAGNOSIS — G4701 Insomnia due to medical condition: Secondary | ICD-10-CM

## 2020-04-24 DIAGNOSIS — H2512 Age-related nuclear cataract, left eye: Secondary | ICD-10-CM | POA: Diagnosis not present

## 2020-04-24 DIAGNOSIS — Z9189 Other specified personal risk factors, not elsewhere classified: Secondary | ICD-10-CM | POA: Diagnosis not present

## 2020-04-24 DIAGNOSIS — F3176 Bipolar disorder, in full remission, most recent episode depressed: Secondary | ICD-10-CM

## 2020-04-24 DIAGNOSIS — D631 Anemia in chronic kidney disease: Secondary | ICD-10-CM | POA: Diagnosis not present

## 2020-04-24 DIAGNOSIS — Z79899 Other long term (current) drug therapy: Secondary | ICD-10-CM | POA: Diagnosis not present

## 2020-04-24 DIAGNOSIS — I10 Essential (primary) hypertension: Secondary | ICD-10-CM | POA: Diagnosis not present

## 2020-04-24 DIAGNOSIS — N184 Chronic kidney disease, stage 4 (severe): Secondary | ICD-10-CM | POA: Diagnosis not present

## 2020-04-24 DIAGNOSIS — N2581 Secondary hyperparathyroidism of renal origin: Secondary | ICD-10-CM | POA: Diagnosis not present

## 2020-04-24 MED ORDER — TRAZODONE HCL 100 MG PO TABS: 100.0000 mg | ORAL_TABLET | Freq: Every day | ORAL | 0 refills | Status: DC

## 2020-04-24 MED ORDER — OLANZAPINE 5 MG PO TABS: 5.0000 mg | ORAL_TABLET | Freq: Every day | ORAL | 0 refills | Status: DC

## 2020-04-24 NOTE — Progress Notes (Signed)
Virtual Visit via Telephone Note  I connected with Luis Coss. on 04/24/20 at  3:00 PM EST by telephone and verified that I am speaking with the correct person using two identifiers.  Location Provider Location : [facility] Patient Location : Home  Participants: Patient , Provider   I discussed the limitations, risks, security and privacy concerns of performing an evaluation and management service by telephone and the availability of in person appointments. I also discussed with the patient that there may be a patient responsible charge related to this service. The patient expressed understanding and agreed to proceed.  I discussed the assessment and treatment plan with the patient. The patient was provided an opportunity to ask questions and all were answered. The patient agreed with the plan and demonstrated an understanding of the instructions.   The patient was advised to call back or seek an in-person evaluation if the symptoms worsen or if the condition fails to improve as anticipated.  [place] MD OP Progress Note  04/24/2020 5:45 PM Luis Coss.  MRN:  [id number]  Chief Complaint:  Chief Complaint    Follow-up     HPI: Luis Bellin. is a 75 year old Caucasian male, divorced, retired, lives in [place], has a history of bipolar disorder currently in remission, chronic kidney disease stage IV multiple other medical problems was evaluated by telemedicine today.  Patient was unable to connect by video hence appointment was completed by audio.  Patient used to be under the care of Dr. Toy Care , last appointment was on 02/20/2020 by telephone.  I have reviewed medical records including most recent notes per Dr.Kaur dated 02/20/2020.  At that visit patient was continued on olanzapine 5 mg, trazodone was increased to 200 mg at bedtime and fluoxetine was continued at 10 mg.  Patient today returns reporting that he has been doing well with regards to his mood symptoms.  He denies any  significant mood swings.  He reports since being on the combination of the medications as noted above he is doing fine.  Patient continues to struggle with sleep problems on and off however overall he is able to sleep through the night although interrupted.  Patient reports he has to wake up a few times at night to urinate due to his chronic kidney disease.  However he is able to fall back asleep quickly.  The trazodone does help.  Patient denies any suicidality, homicidality or perceptual disturbances.  Patient does report forgetfulness on and off however overall he is doing okay and denies any significant concerns with memory at this time.  He was able to answer all questions appropriately.  He appeared to be alert and oriented to person place time and situation.  Patient denies any side effects to any of these medications.  Patient continues to follow-up with his urologist for his kidney disease.  He also has upcoming appointment with primary care provider.  Patient reports he spends his time staying busy, [facility], enjoys it.  Visit Diagnosis:    ICD-10-CM   1. Bipolar disorder, in full remission, most recent episode depressed ([place])  F31.76 OLANZapine (ZYPREXA) 5 MG tablet    traZODone (DESYREL) 100 MG tablet  2. Insomnia due to medical condition  G47.01   3. High risk medication use  Z79.899   4. At risk for prolonged QT interval syndrome  Z91.89     Past Psychiatric History: Patient was diagnosed with mental  health problems in his early 21s.  He reports he does not remember having manic episodes however remembers that he could not function at his work.  He reports he had just started his new job which was very stressful.  He was married to his first wife at that time.  He also remember having flu-like symptoms and taking an over-the-counter medication for his cold.  He was admitted to [facility] at that time.  Patient reports at  least 4 inpatient mental health admissions in the past.  [facility] as well as [facility]. Patient denies suicide attempts. Patient used to be under the care of Dr.Faheem, Dr.Kaur in the past. Past trials of medications like lithium, Ambien, Sonata, Seroquel, mirtazapine, Strattera.  Patient did well on lithium however he developed kidney disease and was recently taken off of it.  Past Medical History:  Past Medical History:  Diagnosis Date  . Anemia   . Apnea, sleep    CPAP  . Arthritis   . Bipolar affective ([place])   . Cancer ([place]) malignant melanoma  . COVID-19 12/17/2019  . CRI (chronic renal insufficiency)   . Crohn's disease ([place])   . Depression   . Diabetes insipidus ([place])   . Erectile dysfunction   . Gout   . Hyperlipidemia   . Hypertension   . IBS (irritable bowel syndrome)   . Impotence   . Internal hemorrhoids   . 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[place])   . Paraphimosis   . Peyronie disease   . Secondary hyperparathyroidism of renal origin ([place])   . Skin cancer   . Testicular hypofunction   . Urinary frequency   . Urinary hesitancy   . Wears hearing aid in both ears     Past Surgical History:  Procedure Laterality Date  . arm fracture    . BONE MARROW BIOPSY    . CATARACT EXTRACTION W/PHACO Right 04/09/2020   Procedure: CATARACT EXTRACTION PHACO AND INTRAOCULAR LENS PLACEMENT (IOC) RIGHT EYHANCE TORIC 6.84 01:03.7 10.7%;  Surgeon: Leandrew Koyanagi, MD;  Location: [facility];  Service: Ophthalmology;  Laterality: Right;  sleep apnea  . COLONOSCOPY WITH PROPOFOL N/A 12/23/2014   Procedure: COLONOSCOPY WITH PROPOFOL;  Surgeon: Manya Silvas, MD;  Location: [facility] ENDOSCOPY;  Service: Endoscopy;  Laterality: N/A;  . COLONOSCOPY WITH PROPOFOL N/A 02/06/2018   Procedure: COLONOSCOPY WITH PROPOFOL;  Surgeon: Manya Silvas, MD;  Location: [facility] ENDOSCOPY;  Service: Endoscopy;  Laterality: N/A;  . KNEE ARTHROPLASTY Left 02/22/2018   Procedure:  COMPUTER ASSISTED TOTAL KNEE ARTHROPLASTY;  Surgeon: Dereck Leep, MD;  Location: [facility] ORS;  Service: Orthopedics;  Laterality: Left;  Marland Kitchen MELANOMA EXCISION    . PENILE PROSTHESIS IMPLANT    . PENILE PROSTHESIS IMPLANT N/A 08/25/2015   Procedure: REPLACEMENT OF INFLATABLE PENILE PROSTHESIS COMPONENTS;  Surgeon: Cleon Gustin, MD;  Location: [facility] ORS;  Service: Urology;  Laterality: N/A;  . SKIN CANCER EXCISION     nose  . TONSILLECTOMY    . VASECTOMY      Family Psychiatric History: Patient reports that mental health problems runs in his family.  Nephew-bipolar disorder, sister-deceased-depression.  Patient denies suicide in his family.  Family History:  Family History  Problem Relation Age of Onset  . Depression Sister   . Bipolar disorder Other   . Prostate cancer Neg Hx   . Kidney cancer Neg Hx   . Bladder Cancer Neg Hx     Social History: Patient was born in [place].  He was raised by both parents.  He graduated high school.  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  Patient was married twice, divorced.  Patient reports he currently has a very good relationship with his ex-wife who lives in town and is very supportive.  Patient has a biological son and an adopted daughter and he has a very good relationship with them.  Patient denies any history of sexual trauma.  Patient currently lives by himself in [place]. Social History   Socioeconomic History  . Marital status: Divorced    Spouse name: Not on file  . Number of children: 2  . Years of education: Not on file  . Highest education level: High school graduate  Occupational History  . Occupation: retired  Tobacco Use  . Smoking status: Former Smoker    Types: Cigarettes    Quit date: 09/08/1972    Years since quitting: 47.6  . Smokeless tobacco: Never Used  Vaping Use  . Vaping Use: Never used  Substance and Sexual Activity  . Alcohol use: Not Currently     Alcohol/week: 0.0 standard drinks    Comment: Occasional glass of wine  . Drug use: No  . Sexual activity: Yes    Partners: Female    Birth control/protection: None  Other Topics Concern  . Not on file  Social History Narrative  . Not on file   Social Determinants of Health   Financial Resource Strain: Low Risk   . Difficulty of Paying Living Expenses: Not hard at all  Food Insecurity: No Food Insecurity  . Worried About Charity fundraiser in the Last Year: Never true  . Ran Out of Food in the Last Year: Never true  Transportation Needs: No Transportation Needs  . Lack of Transportation (Medical): No  . Lack of Transportation (Non-Medical): No  Physical Activity: Sufficiently Active  . Days of Exercise per Week: 7 days  . Minutes of Exercise per Session: 30 min  Stress: No Stress Concern Present  . Feeling of Stress : Not at all  Social Connections: Not on file    Allergies:  Allergies  Allergen Reactions  . Mirtazapine Rash  . Aspirin     Other reaction(s): Unknown Patient was instructed not to take aspirin but can not remember why  . Atomoxetine Other (See Comments)    Urinary sx  . Strattera [Atomoxetine Hcl] Other (See Comments)    Urinary sx  . Xyzal [Levocetirizine Dihydrochloride] Rash    Patient reported on 6/44/03    Metabolic Disorder Labs: No results found for: HGBA1C, MPG No results found for: PROLACTIN Lab Results  Component Value Date   CHOL 202 (H) 04/26/2019   TRIG 176 (H) 04/26/2019   HDL 41 04/26/2019   CHOLHDL 3.9 04/24/2018   VLDL 50 (H) 10/03/2017   LDLCALC 130 (H) 04/26/2019   LDLCALC 96 04/24/2018   Lab Results  Component Value Date   TSH 1.226 08/01/2019   TSH 1.060 04/26/2019    Therapeutic Level Labs: Lab Results  Component Value Date   LITHIUM 0.70 08/06/2019   LITHIUM 0.78 08/05/2019   No results found for: VALPROATE No components found for:  CBMZ  Current Medications: Current Outpatient Medications  Medication Sig  Dispense Refill  . allopurinol (ZYLOPRIM) 100 MG tablet Take 200 mg by mouth daily.     Marland Kitchen amLODipine (NORVASC) 10 MG tablet Take 1 tablet (10 mg total) by mouth daily.  90 tablet 1  . benazepril (LOTENSIN) 40 MG tablet Take 40 mg by mouth daily.     Marland Kitchen FLUoxetine (PROZAC) 10 MG capsule Take 1 capsule (10 mg total) by mouth daily. 90 capsule 1  . hydrALAZINE (APRESOLINE) 100 MG tablet Take 100 mg by mouth 3 (three) times daily.    Marland Kitchen loperamide (IMODIUM) 2 MG capsule Take 2 mg by mouth as needed for diarrhea or loose stools.    . Multiple Vitamin (MULTIVITAMIN) tablet Take 1 tablet by mouth daily.    . Vitamin D, Cholecalciferol, 25 MCG (1000 UT) TABS Take 1,000 tablets by mouth daily.     . colchicine 0.6 MG tablet Take 0.5 tablets (0.3 mg total) by mouth daily as needed. (Patient not taking: Reported on 04/24/2020) 5 tablet 0  . cyproheptadine (PERIACTIN) 4 MG tablet Take one tablet three hours prior to intercourse (Patient not taking: Reported on 04/24/2020) 90 tablet 3  . OLANZapine (ZYPREXA) 5 MG tablet Take 1 tablet (5 mg total) by mouth at bedtime. 90 tablet 0  . traZODone (DESYREL) 100 MG tablet Take 1 tablet (100 mg total) by mouth at bedtime. 90 tablet 0   No current facility-administered medications for this visit.     Musculoskeletal: Strength & Muscle Tone: UTA Gait & Station: UTA Patient leans: N/A  Psychiatric Specialty Exam: Review of Systems  Genitourinary: Positive for frequency (chronic).  Psychiatric/Behavioral: Positive for sleep disturbance (stable). Negative for agitation, behavioral problems, confusion, decreased concentration, dysphoric mood, hallucinations, self-injury and suicidal ideas. The patient is not nervous/anxious and is not hyperactive.   All other systems reviewed and are negative.   There were no vitals taken for this visit.There is no height or weight on file to calculate BMI.  General Appearance: UTA  Eye Contact:  UTA  Speech:  Clear and Coherent   Volume:  Normal  Mood:  Euthymic  Affect:  UTA  Thought Process:  Goal Directed and Descriptions of Associations: Intact  Orientation:  Full (Time, Place, and Person)  Thought Content: Logical   Suicidal Thoughts:  No  Homicidal Thoughts:  No  Memory:  Immediate;   Fair Recent;   Fair Remote;   Fair does report some forgetfulness on and off  Judgement:  Fair  Insight:  Fair  Psychomotor Activity:  UTA  Concentration:  Concentration: Fair and Attention Span: Fair  Recall:  [employer] of Knowledge: Fair  Language: Fair  Akathisia:  No  Handed:  Right  AIMS (if indicated): UTA  Assets:  Communication Skills Desire for [place] Talents/Skills Transportation Vocational/Educational  ADL's:  Intact  Cognition: WNL  Sleep:  Restless , but stable on current medication   Screenings: [place] Office Visit from [facility]/23/2017 in [facility]  Total Score (max 30 points ) 28    PHQ2-9   Flowsheet Row Clinical Support from 11/30/2019 in [facility] from 11/20/2018 in [facility] Visit from 04/24/2018 in [facility] from 11/14/2017 in [facility] Visit from 04/05/2017 in [facility]  PHQ-2 Total Score 0 0 0 0 0  PHQ-9 Total Score - - 3 - -       Assessment and Plan: Luis Leitz. is a 75 year old male with history of bipolar disorder, chronic kidney disease stage IV, multiple other medical problems was evaluated by telemedicine today.  Patient is biologically predisposed given his history of mental health problems in  his family, history of medical problems.  Patient with psychosocial stressors of the current pandemic, his own health issues however has good social support system, is compliant on medication as well as follow-up recommendations with his providers.  Discussed plan as noted below.  Plan Bipolar disorder in  full remission Continue olanzapine 5 mg at bedtime. Continue trazodone 100 mg p.o. nightly Fluoxetine 10 mg p.o. daily.  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 Patient is currently on trazodone.  We will continue the same for now. We will monitor closely.  High risk medication use-patient will benefit from repeat labs-TSH, lipid panel, prolactin, hemoglobin A1c, hepatic function, CBC with differential.  Patient will also benefit from vitamin B12 due to his memory problems.  Patient agrees to get it done at his primary care doctor's office at his upcoming appointment.  At risk for prolonged QT syndrome-patient is currently on olanzapine with recent dosage increase. Patient will benefit from EKG.  Patient agrees to get it done at his primary care doctor's office.  I have reviewed medical records in [facility] from Dr. Toy Care - most recent - 02/20/2020, as well as from Dr.Faheem- previous visits.  Follow-up in clinic in 2 to 3 months or sooner if needed.  I have spent atleast 30 minutes non face to face  with patient today. More than 50 % of the time was spent for preparing to see the patient ( e.g., review of test, records ), obtaining and to review and separately obtained history , ordering medications and test ,psychoeducation and supportive psychotherapy and care coordination,as well as documenting clinical information in electronic health record. This note was generated in part or whole with voice recognition software. Voice recognition is usually quite accurate but there are transcription errors that can and very often do occur. I apologize for any typographical errors that were not detected and corrected.         Ursula Alert, MD 04/25/2020, 8:26 AM

## 2020-05-05 ENCOUNTER — Other Ambulatory Visit: Payer: Self-pay

## 2020-05-05 ENCOUNTER — Other Ambulatory Visit
Admission: RE | Admit: 2020-05-05 | Discharge: 2020-05-05 | Disposition: A | Discharge status: Home / Self Care | Payer: Medicare Other | Source: Ambulatory Visit | Attending: Ophthalmology | Admitting: Ophthalmology

## 2020-05-05 DIAGNOSIS — Z20822 Contact with and (suspected) exposure to covid-19: Secondary | ICD-10-CM | POA: Insufficient documentation

## 2020-05-05 DIAGNOSIS — Z01812 Encounter for preprocedural laboratory examination: Secondary | ICD-10-CM | POA: Insufficient documentation

## 2020-05-05 LAB — SARS CORONAVIRUS 2 (TAT 6-24 HRS): SARS Coronavirus 2: NEGATIVE

## 2020-05-05 NOTE — Discharge Instructions (Signed)

## 2020-05-07 ENCOUNTER — Encounter
Admission: RE | Disposition: A | Discharge status: Home / Self Care | Payer: Self-pay | Source: Home / Self Care | Attending: Ophthalmology

## 2020-05-07 ENCOUNTER — Ambulatory Visit: Payer: Medicare Other | Admitting: Anesthesiology

## 2020-05-07 ENCOUNTER — Other Ambulatory Visit: Payer: Self-pay

## 2020-05-07 ENCOUNTER — Ambulatory Visit
Admission: RE | Admit: 2020-05-07 | Discharge: 2020-05-07 | Disposition: A | Discharge status: Home / Self Care | Payer: Medicare Other | Attending: Ophthalmology | Admitting: Ophthalmology

## 2020-05-07 DIAGNOSIS — Z9841 Cataract extraction status, right eye: Secondary | ICD-10-CM | POA: Diagnosis not present

## 2020-05-07 DIAGNOSIS — Z96652 Presence of left artificial knee joint: Secondary | ICD-10-CM | POA: Diagnosis not present

## 2020-05-07 DIAGNOSIS — Z87891 Personal history of nicotine dependence: Secondary | ICD-10-CM | POA: Insufficient documentation

## 2020-05-07 DIAGNOSIS — Z85820 Personal history of malignant melanoma of skin: Secondary | ICD-10-CM | POA: Diagnosis not present

## 2020-05-07 DIAGNOSIS — H25812 Combined forms of age-related cataract, left eye: Secondary | ICD-10-CM | POA: Diagnosis not present

## 2020-05-07 DIAGNOSIS — H2512 Age-related nuclear cataract, left eye: Secondary | ICD-10-CM | POA: Insufficient documentation

## 2020-05-07 DIAGNOSIS — Z8616 Personal history of COVID-19: Secondary | ICD-10-CM | POA: Diagnosis not present

## 2020-05-07 DIAGNOSIS — Z79899 Other long term (current) drug therapy: Secondary | ICD-10-CM | POA: Insufficient documentation

## 2020-05-07 HISTORY — PX: CATARACT EXTRACTION W/PHACO: SHX586

## 2020-05-07 SURGERY — PHACOEMULSIFICATION, CATARACT, WITH IOL INSERTION
Anesthesia: Monitor Anesthesia Care | Site: Eye | Laterality: Left

## 2020-05-07 MED ORDER — MIDAZOLAM HCL 2 MG/2ML IJ SOLN
INTRAMUSCULAR | Status: DC | PRN
  Administered 2020-05-07: 2 mg via INTRAVENOUS

## 2020-05-07 MED ORDER — LIDOCAINE HCL (PF) 2 % IJ SOLN
INTRAOCULAR | Status: DC | PRN
  Administered 2020-05-07: 2 mL

## 2020-05-07 MED ORDER — ARMC OPHTHALMIC DILATING DROPS
1.0000 "application " | OPHTHALMIC | Status: DC | PRN
  Administered 2020-05-07 (×3): 1 via OPHTHALMIC

## 2020-05-07 MED ORDER — NA HYALUR & NA CHOND-NA HYALUR 0.4-0.35 ML IO KIT
PACK | INTRAOCULAR | Status: DC | PRN
  Administered 2020-05-07: 1 mL via INTRAOCULAR

## 2020-05-07 MED ORDER — LACTATED RINGERS IV SOLN: INTRAVENOUS | Status: DC

## 2020-05-07 MED ORDER — CEFUROXIME OPHTHALMIC INJECTION 1 MG/0.1 ML
INJECTION | OPHTHALMIC | Status: DC | PRN
  Administered 2020-05-07: 0.1 mL via INTRACAMERAL

## 2020-05-07 MED ORDER — FENTANYL CITRATE (PF) 100 MCG/2ML IJ SOLN
INTRAMUSCULAR | Status: DC | PRN
  Administered 2020-05-07 (×2): 50 ug via INTRAVENOUS

## 2020-05-07 MED ORDER — BRIMONIDINE TARTRATE-TIMOLOL 0.2-0.5 % OP SOLN
OPHTHALMIC | Status: DC | PRN
  Administered 2020-05-07: 1 [drp] via OPHTHALMIC

## 2020-05-07 MED ORDER — EPINEPHRINE PF 1 MG/ML IJ SOLN
INTRAOCULAR | Status: DC | PRN
  Administered 2020-05-07: 78 mL via OPHTHALMIC

## 2020-05-07 MED ORDER — TETRACAINE HCL 0.5 % OP SOLN
1.0000 [drp] | OPHTHALMIC | Status: DC | PRN
  Administered 2020-05-07 (×3): 1 [drp] via OPHTHALMIC

## 2020-05-07 SURGICAL SUPPLY — 24 items
CANNULA ANT/CHMB 27G (MISCELLANEOUS) ×1 IMPLANT
CANNULA ANT/CHMB 27GA (MISCELLANEOUS) ×2 IMPLANT
GLOVE SURG LX 7.5 STRW (GLOVE) ×1
GLOVE SURG LX STRL 7.5 STRW (GLOVE) ×1 IMPLANT
GLOVE SURG TRIUMPH 8.0 PF LTX (GLOVE) ×2 IMPLANT
GOWN STRL REUS W/ TWL LRG LVL3 (GOWN DISPOSABLE) ×2 IMPLANT
GOWN STRL REUS W/TWL LRG LVL3 (GOWN DISPOSABLE) ×4
LENS IOL EYHANCE TORIC II 27.0 ×2 IMPLANT
LENS IOL EYHANCE TRC 225 27.0 IMPLANT
LENS IOL EYHNC TORIC 225 27.0 ×1 IMPLANT
MARKER SKIN DUAL TIP RULER LAB (MISCELLANEOUS) ×2 IMPLANT
NDL CAPSULORHEX 25GA (NEEDLE) ×1 IMPLANT
NDL FILTER BLUNT 18X1 1/2 (NEEDLE) ×2 IMPLANT
NEEDLE CAPSULORHEX 25GA (NEEDLE) ×2 IMPLANT
NEEDLE FILTER BLUNT 18X 1/2SAF (NEEDLE) ×2
NEEDLE FILTER BLUNT 18X1 1/2 (NEEDLE) ×2 IMPLANT
PACK CATARACT BRASINGTON (MISCELLANEOUS) ×2 IMPLANT
PACK EYE AFTER SURG (MISCELLANEOUS) ×2 IMPLANT
PACK OPTHALMIC (MISCELLANEOUS) ×2 IMPLANT
SOLUTION OPHTHALMIC SALT (MISCELLANEOUS) ×2 IMPLANT
SYR 3ML LL SCALE MARK (SYRINGE) ×4 IMPLANT
SYR TB 1ML LUER SLIP (SYRINGE) ×2 IMPLANT
WATER STERILE IRR 250ML POUR (IV SOLUTION) ×2 IMPLANT
WIPE NON LINTING 3.25X3.25 (MISCELLANEOUS) ×2 IMPLANT

## 2020-05-07 NOTE — Anesthesia Preprocedure Evaluation (Signed)
Anesthesia Evaluation  Patient identified by MRN, date of birth, ID band Patient awake    Reviewed: Allergy & Precautions, H&P , NPO status , Patient's Chart, lab work & pertinent test results  Airway Mallampati: II  TM Distance: >3 FB Neck ROM: full    Dental no notable dental hx.    Pulmonary sleep apnea , former smoker,    Pulmonary exam normal        Cardiovascular hypertension, On Medications Normal cardiovascular exam Rhythm:regular Rate:Normal     Neuro/Psych Depression negative neurological ROS     GI/Hepatic Neg liver ROS,   Endo/Other  negative endocrine ROS  Renal/GU   negative genitourinary   Musculoskeletal   Abdominal   Peds  Hematology   Anesthesia Other Findings   Reproductive/Obstetrics                            Anesthesia Physical Anesthesia Plan  ASA: II  Anesthesia Plan: MAC   Post-op Pain Management:    Induction:   PONV Risk Score and Plan: 1 and Treatment may vary due to age or medical condition  Airway Management Planned:   Additional Equipment:   Intra-op Plan:   Post-operative Plan:   Informed Consent: I have reviewed the patients History and Physical, chart, labs and discussed the procedure including the risks, benefits and alternatives for the proposed anesthesia with the patient or authorized representative who has indicated his/her understanding and acceptance.       Plan Discussed with:   Anesthesia Plan Comments:         Anesthesia Quick Evaluation

## 2020-05-07 NOTE — Anesthesia Postprocedure Evaluation (Signed)
Anesthesia Post Note  Patient: Luis Miller.  Procedure(s) Performed: CATARACT EXTRACTION PHACO AND INTRAOCULAR LENS PLACEMENT (IOC) LEFT EYHANCE TORIC 2.93 00:58.7 5.0% (Left Eye)     Patient location during evaluation: PACU Anesthesia Type: MAC Level of consciousness: awake and alert Pain management: pain level controlled Vital Signs Assessment: post-procedure vital signs reviewed and stable Respiratory status: spontaneous breathing Cardiovascular status: stable Anesthetic complications: no   No complications documented.  Gillian Scarce

## 2020-05-07 NOTE — H&P (Signed)

## 2020-05-07 NOTE — Transfer of Care (Signed)
Immediate Anesthesia Transfer of Care Note  Patient: Luis Miller.  Procedure(s) Performed: CATARACT EXTRACTION PHACO AND INTRAOCULAR LENS PLACEMENT (IOC) LEFT EYHANCE TORIC 2.93 00:58.7 5.0% (Left Eye)  Patient Location: PACU  Anesthesia Type: MAC  Level of Consciousness: awake, alert  and patient cooperative  Airway and Oxygen Therapy: Patient Spontanous Breathing and Patient connected to supplemental oxygen  Post-op Assessment: Post-op Vital signs reviewed, Patient's Cardiovascular Status Stable, Respiratory Function Stable, Patent Airway and No signs of Nausea or vomiting  Post-op Vital Signs: Reviewed and stable  Complications: No complications documented.

## 2020-05-07 NOTE — Op Note (Signed)
LOCATION:  [facility]   PREOPERATIVE DIAGNOSIS:  Nuclear sclerotic cataract of the left eye.  H25.12  POSTOPERATIVE DIAGNOSIS:  Nuclear sclerotic cataract of the left eye.   PROCEDURE:  Phacoemulsification with Toric posterior chamber intraocular lens placement of the left eye.  Ultrasound time: Procedure(s): CATARACT EXTRACTION PHACO AND INTRAOCULAR LENS PLACEMENT (IOC) LEFT EYHANCE TORIC 2.93 00:58.7 5.0% (Left) LENS: [id number] Eyhance Toric 27.0 D Toric intraocular lens with 2.25 diopters of cylindrical power with axis orientation at 60 degrees.     SURGEON:  Wyonia Hough, MD   ANESTHESIA:  Topical with tetracaine drops and 2% Xylocaine jelly, augmented with 1% preservative-free intracameral lidocaine.  COMPLICATIONS:  None.   DESCRIPTION OF PROCEDURE:  The patient was identified in the holding room and transported to the operating suite and placed in the supine position under the operating microscope.  The left eye was identified as the operative eye, and it was prepped and draped in the usual sterile ophthalmic fashion.    A clear-corneal paracentesis incision was made at the 1:30 position.  0.5 ml of preservative-free 1% lidocaine was injected into the anterior chamber. The anterior chamber was filled with Viscoat.  A 2.4 millimeter near clear corneal incision was then made at the 10:30 position.  A cystotome and capsulorrhexis forceps were then used to make a curvilinear capsulorrhexis.  Hydrodissection and hydrodelineation were then performed using balanced salt solution.   Phacoemulsification was then used in stop and chop fashion to remove the lens, nucleus and epinucleus.  The remaining cortex was aspirated using the irrigation and aspiration handpiece.  Provisc viscoelastic was then placed into the capsular bag to distend it for lens placement.  The Verion digital marker was used to align the implant at the intended axis.   A 27.0 diopter lens was then injected  into the capsular bag.  It was rotated clockwise until the axis marks on the lens were approximately 15 degrees in the counterclockwise direction to the intended alignment.  The viscoelastic was aspirated from the eye using the irrigation aspiration handpiece.  Then, a Koch spatula through the sideport incision was used to rotate the lens in a clockwise direction until the axis markings of the intraocular lens were lined up with the Verion alignment.  Balanced salt solution was then used to hydrate the wounds. Cefuroxime 0.1 ml of a 65m/ml solution was injected into the anterior chamber for a dose of 1 mg of intracameral antibiotic at the completion of the case.    The eye was noted to have a physiologic pressure and there was no wound leak noted.   Timolol and Brimonidine drops were applied to the eye.  The patient was taken to the recovery room in stable condition having had no complications of anesthesia or surgery.  Luis Miller 05/07/2020, 11:08 AM

## 2020-05-07 NOTE — Anesthesia Procedure Notes (Signed)
Procedure Name: MAC Date/Time: 05/07/2020 10:47 AM Performed by: Jeannene Patella, CRNA Pre-anesthesia Checklist: Patient identified, Emergency Drugs available, Suction available, Timeout performed and Patient being monitored Patient Re-evaluated:Patient Re-evaluated prior to induction Oxygen Delivery Method: Nasal cannula Placement Confirmation: positive ETCO2

## 2020-05-09 ENCOUNTER — Encounter: Payer: Self-pay | Admitting: Ophthalmology

## 2020-05-16 NOTE — Progress Notes (Signed)
BP (!) 146/75   Pulse 67   Temp 97.9 F (36.6 C)   Ht 5' 6.93" (1.7 m)   Wt 188 lb 8 oz (85.5 kg)   SpO2 99%   BMI 29.59 kg/m    Subjective:    Patient ID: Luis Miller., male    DOB: 06/10/1945, 75 y.o.   MRN: [id number]  HPI: [name]. is a 75 y.o. male  Chief Complaint  Patient presents with  . Hypertension   HYPERTENSION / HYPERLIPIDEMIA Satisfied with current treatment? no Duration of hypertension: years BP monitoring frequency: a few times a month BP range:  130/70s BP medication side effects: no Past BP meds: amlodipine, benzapril, hydralazine. Duration of hyperlipidemia: years Cholesterol medication side effects: no Cholesterol supplements: none. Past cholesterol medications: None Medication compliance: Good Aspirin: no Recent stressors: no Recurrent headaches: no Visual changes: no Palpitations: no Dyspnea: no Chest pain: no Lower extremity edema: no Dizzy/lightheaded: no  The 10-year ASCVD risk score Luis Miller DC Jr., et al., 2013) is: 36.6%   Values used to calculate the score:     Age: 54 years     Sex: Male     Is Non-Hispanic African American: No     Diabetic: No     Tobacco smoker: No     Systolic Blood Pressure: 974 mmHg     Is BP treated: Yes     HDL Cholesterol: 41 mg/dL     Total Cholesterol: 202 mg/dL   CHRONIC KIDNEY DISEASE- managing Diabetes Insipidus and Hypertension CKD status: stable Medications renally dose: no Previous renal evaluation: no Pneumovax:  unknown Influenza Vaccine:  unknown  BIPOLAR Patient follows up with Behavioral health who requested TSH, A1c, Lipid, Hepatic function, B12, and Prolactin.  Patient is also at increased risk for prolonged QT and should get a EKG.     Relevant past medical, surgical, family and social history reviewed and updated as indicated. Interim medical history since our last visit reviewed. Allergies and medications reviewed and updated.  Review of Systems  Eyes: Negative  for visual disturbance.  Respiratory: Negative for chest tightness and shortness of breath.   Cardiovascular: Negative for chest pain, palpitations and leg swelling.  Endocrine: Positive for polydipsia and polyuria.  Neurological: Negative for dizziness, light-headedness, numbness and headaches.    Per HPI unless specifically indicated above     Objective:    BP (!) 146/75   Pulse 67   Temp 97.9 F (36.6 C)   Ht 5' 6.93" (1.7 m)   Wt 188 lb 8 oz (85.5 kg)   SpO2 99%   BMI 29.59 kg/m   Wt Readings from Last 3 Encounters:  05/19/20 188 lb 8 oz (85.5 kg)  05/07/20 182 lb (82.6 kg)  04/09/20 181 lb (82.1 kg)    Physical Exam Vitals and nursing note reviewed.  Constitutional:      General: He is not in acute distress.    Appearance: Normal appearance. He is not ill-appearing, toxic-appearing or diaphoretic.  HENT:     Head: Normocephalic.     Right Ear: Tympanic membrane, ear canal and external ear normal.     Left Ear: Tympanic membrane, ear canal and external ear normal.     Nose: Nose normal. No congestion or rhinorrhea.     Mouth/Throat:     Mouth: Mucous membranes are moist.  Eyes:     General:        Right eye: No discharge.  Left eye: No discharge.     Extraocular Movements: Extraocular movements intact.     Conjunctiva/sclera: Conjunctivae normal.     Pupils: Pupils are equal, round, and reactive to light.  Cardiovascular:     Rate and Rhythm: Normal rate and regular rhythm.     Heart sounds: No murmur heard.   Pulmonary:     Effort: Pulmonary effort is normal. No respiratory distress.     Breath sounds: Normal breath sounds. No wheezing, rhonchi or rales.  Abdominal:     General: Abdomen is flat. Bowel sounds are normal. There is no distension.     Palpations: Abdomen is soft.     Tenderness: There is no abdominal tenderness. There is no guarding.  Musculoskeletal:     Cervical back: Normal range of motion and neck supple.  Skin:    General: Skin  is warm and dry.     Capillary Refill: Capillary refill takes less than 2 seconds.  Neurological:     General: No focal deficit present.     Mental Status: He is alert and oriented to person, place, and time.     Cranial Nerves: No cranial nerve deficit.     Motor: No weakness.     Deep Tendon Reflexes: Reflexes normal.  Psychiatric:        Mood and Affect: Mood normal.        Behavior: Behavior normal.        Thought Content: Thought content normal.        Judgment: Judgment normal.       Assessment & Plan:   Problem List Items Addressed This Visit      Cardiovascular and Mediastinum   Essential hypertension    Elevated at visit today.  Blood pressure is managed by Nephrology.  Recommend following up with Dr. Holley Raring for further management.       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  Managed by Nephrology.      Secondary hyperparathyroidism of renal origin (HCC)    Chronic, stable.  Managed by Nephrology.        Genitourinary   CKD (chronic kidney disease) stage 4, GFR 15-29 ml/min (HCC)    Chronic, stable.  Managed by Nephrology.        Other   Bipolar disorder, in full remission, most recent episode depressed ([place])    Stable.  Continues to follow up with Psychiatry.      High risk medication use    Labs drawn at visit today.  Requested by Psychiatrist.  Will send results.       Relevant Orders   HgB A1c   At risk for prolonged QT interval syndrome    EKG ordered today.  NSR.  Will send results to psychiatrist who requested EKG due to Zyprexa.      Relevant Orders   EKG 12-Lead (Completed)   EKG   Memory loss    Labs ordered at visit today at the request of psychiatrist.  Will send results.      Relevant Orders   TSH   Prolactin   CBC w/Diff   B12   Hyperlipidemia    Chronic, stable.  Reviewed ASCVD risk score of  36.6% with patient during visit.  Begin Crestor 55m daily.  Will titrate up as tolerated by patient.  Relevant Medications   amLODipine (NORVASC) 10 MG tablet   rosuvastatin (CRESTOR) 5 MG tablet       Follow up plan: Return in about 6 months (around 11/16/2020) for Physical and Fasting labs.

## 2020-05-19 ENCOUNTER — Encounter: Payer: Self-pay | Admitting: Nurse Practitioner

## 2020-05-19 ENCOUNTER — Other Ambulatory Visit: Payer: Self-pay

## 2020-05-19 ENCOUNTER — Ambulatory Visit (INDEPENDENT_AMBULATORY_CARE_PROVIDER_SITE_OTHER): Payer: Medicare Other | Admitting: Nurse Practitioner

## 2020-05-19 VITALS — BP 146/75 | HR 67 | Temp 97.9°F | Ht 66.93 in | Wt 188.5 lb

## 2020-05-19 DIAGNOSIS — N2581 Secondary hyperparathyroidism of renal origin: Secondary | ICD-10-CM | POA: Diagnosis not present

## 2020-05-19 DIAGNOSIS — Z79899 Other long term (current) drug therapy: Secondary | ICD-10-CM

## 2020-05-19 DIAGNOSIS — E782 Mixed hyperlipidemia: Secondary | ICD-10-CM

## 2020-05-19 DIAGNOSIS — F3176 Bipolar disorder, in full remission, most recent episode depressed: Secondary | ICD-10-CM | POA: Diagnosis not present

## 2020-05-19 DIAGNOSIS — E232 Diabetes insipidus: Secondary | ICD-10-CM

## 2020-05-19 DIAGNOSIS — I10 Essential (primary) hypertension: Secondary | ICD-10-CM

## 2020-05-19 DIAGNOSIS — N184 Chronic kidney disease, stage 4 (severe): Secondary | ICD-10-CM

## 2020-05-19 DIAGNOSIS — E785 Hyperlipidemia, unspecified: Secondary | ICD-10-CM | POA: Insufficient documentation

## 2020-05-19 DIAGNOSIS — Z9189 Other specified personal risk factors, not elsewhere classified: Secondary | ICD-10-CM | POA: Diagnosis not present

## 2020-05-19 DIAGNOSIS — R413 Other amnesia: Secondary | ICD-10-CM

## 2020-05-19 MED ORDER — AMLODIPINE BESYLATE 10 MG PO TABS: 10.0000 mg | ORAL_TABLET | Freq: Every day | ORAL | 1 refills | Status: DC

## 2020-05-19 MED ORDER — ROSUVASTATIN CALCIUM 5 MG PO TABS: 5.0000 mg | ORAL_TABLET | Freq: Every day | ORAL | 1 refills | Status: DC

## 2020-05-19 NOTE — Assessment & Plan Note (Signed)
Labs ordered at visit today at the request of psychiatrist.  Will send results.

## 2020-05-19 NOTE — Assessment & Plan Note (Signed)
Labs drawn at visit today.  Requested by Psychiatrist.  Will send results.

## 2020-05-19 NOTE — Progress Notes (Signed)
Normal sinus rhythm.  Results discussed with patient in office.

## 2020-05-19 NOTE — Assessment & Plan Note (Signed)
Chronic, controlled.  Managed by Nephrology.

## 2020-05-19 NOTE — Progress Notes (Signed)
Good Morning.  I saw Luis Miller this morning.  I will be his new PCP.  His EKG looked great.  NSR.  I will forward the lab work to you once it has returned.

## 2020-05-19 NOTE — Assessment & Plan Note (Signed)
Chronic, stable.  Reviewed ASCVD risk score of 36.6% with patient during visit.  Begin Crestor 12m daily.  Will titrate up as tolerated by patient.

## 2020-05-19 NOTE — Assessment & Plan Note (Signed)
Chronic, stable.  Managed by Nephrology.

## 2020-05-19 NOTE — Assessment & Plan Note (Addendum)
Elevated at visit today.  Blood pressure is managed by Nephrology.  Recommend following up with Dr. Holley Raring for further management.

## 2020-05-19 NOTE — Assessment & Plan Note (Signed)
EKG ordered today.  NSR.  Will send results to psychiatrist who requested EKG due to Zyprexa.

## 2020-05-19 NOTE — Assessment & Plan Note (Signed)
Stable.  Continues to follow up with Psychiatry.

## 2020-05-20 LAB — CBC WITH DIFFERENTIAL/PLATELET
Basophils Absolute: 0 10*3/uL (ref 0.0–0.2)
Basos: 0 %
EOS (ABSOLUTE): 0.2 10*3/uL (ref 0.0–0.4)
Eos: 6 %
Hematocrit: 31.8 % — ABNORMAL LOW (ref 37.5–51.0)
Hemoglobin: 11.3 g/dL — ABNORMAL LOW (ref 13.0–17.7)
Immature Grans (Abs): 0 10*3/uL (ref 0.0–0.1)
Immature Granulocytes: 1 %
Lymphocytes Absolute: 0.8 10*3/uL (ref 0.7–3.1)
Lymphs: 23 %
MCH: 35.6 pg — ABNORMAL HIGH (ref 26.6–33.0)
MCHC: 35.5 g/dL (ref 31.5–35.7)
MCV: 100 fL — ABNORMAL HIGH (ref 79–97)
Monocytes Absolute: 0.4 10*3/uL (ref 0.1–0.9)
Monocytes: 12 %
Neutrophils Absolute: 1.9 10*3/uL (ref 1.4–7.0)
Neutrophils: 58 %
Platelets: 128 10*3/uL — ABNORMAL LOW (ref 150–450)
RBC: 3.17 x10E6/uL — ABNORMAL LOW (ref 4.14–5.80)
RDW: 14.2 % (ref 11.6–15.4)
WBC: 3.3 10*3/uL — ABNORMAL LOW (ref 3.4–10.8)

## 2020-05-20 LAB — TSH: TSH: 0.313 u[IU]/mL — ABNORMAL LOW (ref 0.450–4.500)

## 2020-05-20 LAB — LIPID PANEL
Chol/HDL Ratio: 5 ratio (ref 0.0–5.0)
Cholesterol, Total: 227 mg/dL — ABNORMAL HIGH (ref 100–199)
HDL: 45 mg/dL (ref >39)
LDL Chol Calc (NIH): 155 mg/dL — ABNORMAL HIGH (ref 0–99)
Triglycerides: 147 mg/dL (ref 0–149)
VLDL Cholesterol Cal: 27 mg/dL (ref 5–40)

## 2020-05-20 LAB — HEMOGLOBIN A1C
Est. average glucose Bld gHb Est-mCnc: 103 mg/dL
Hgb A1c MFr Bld: 5.2 % (ref 4.8–5.6)

## 2020-05-20 LAB — PROLACTIN: Prolactin: 15.4 ng/mL — ABNORMAL HIGH (ref 4.0–15.2)

## 2020-05-20 LAB — VITAMIN B12: Vitamin B-12: 572 pg/mL (ref 232–1245)

## 2020-05-20 NOTE — Progress Notes (Signed)
Hi Prosper.  It was a pleasure meeting you yesterday.  Your lab work shows that your cholesterol is elevated.  Your thyroid hormone is low indicating that it is over active.  You have had this in the past and could be related to your medications.  I would like you to come back in 1 month to have this redrawn.  Your prolactin is also elevated which is likely related to the medication you are on.   Your blood counts have improved from prior.  We will continue to monitor this.  Otherwise, your lab work looks good.  Please make a lab appointment to have the TSH repeated.  I will make sure to send this to Dr. Shea Evans.  See you at our next visit.

## 2020-05-20 NOTE — Addendum Note (Signed)
Addended by: Jon Billings on: 05/20/2020 10:16 AM   Modules accepted: Orders

## 2020-05-20 NOTE — Progress Notes (Signed)
Hi Dr. Shea Evans.  Here are [facility].  I am going to repeat his TSH in one month.  Please let me know if you need anything else.

## 2020-05-26 ENCOUNTER — Telehealth (HOSPITAL_COMMUNITY): Payer: Self-pay | Admitting: *Deleted

## 2020-05-26 NOTE — Telephone Encounter (Signed)
LM FOR Rx REFILL REQUEST

## 2020-06-02 DIAGNOSIS — N2581 Secondary hyperparathyroidism of renal origin: Secondary | ICD-10-CM | POA: Diagnosis not present

## 2020-06-02 DIAGNOSIS — R809 Proteinuria, unspecified: Secondary | ICD-10-CM | POA: Diagnosis not present

## 2020-06-02 DIAGNOSIS — D631 Anemia in chronic kidney disease: Secondary | ICD-10-CM | POA: Diagnosis not present

## 2020-06-02 DIAGNOSIS — I10 Essential (primary) hypertension: Secondary | ICD-10-CM | POA: Diagnosis not present

## 2020-06-02 DIAGNOSIS — N184 Chronic kidney disease, stage 4 (severe): Secondary | ICD-10-CM | POA: Diagnosis not present

## 2020-06-09 ENCOUNTER — Other Ambulatory Visit
Admission: RE | Admit: 2020-06-09 | Discharge: 2020-06-09 | Disposition: A | Discharge status: Home / Self Care | Payer: Medicare Other | Source: Ambulatory Visit | Attending: Ophthalmology | Admitting: Ophthalmology

## 2020-06-09 DIAGNOSIS — H47012 Ischemic optic neuropathy, left eye: Secondary | ICD-10-CM | POA: Insufficient documentation

## 2020-06-09 LAB — SEDIMENTATION RATE: Sed Rate: 54 mm/hr — ABNORMAL HIGH (ref 0–20)

## 2020-06-09 LAB — C-REACTIVE PROTEIN: CRP: 0.6 mg/dL (ref <1.0)

## 2020-06-09 LAB — CBC
HCT: 31.2 % — ABNORMAL LOW (ref 39.0–52.0)
Hemoglobin: 10.8 g/dL — ABNORMAL LOW (ref 13.0–17.0)
MCH: 34.3 pg — ABNORMAL HIGH (ref 26.0–34.0)
MCHC: 34.6 g/dL (ref 30.0–36.0)
MCV: 99 fL (ref 80.0–100.0)
Platelets: 127 10*3/uL — ABNORMAL LOW (ref 150–400)
RBC: 3.15 MIL/uL — ABNORMAL LOW (ref 4.22–5.81)
RDW: 13.7 % (ref 11.5–15.5)
WBC: 3.3 10*3/uL — ABNORMAL LOW (ref 4.0–10.5)
nRBC: 0 % (ref 0.0–0.2)

## 2020-06-12 DIAGNOSIS — H47012 Ischemic optic neuropathy, left eye: Secondary | ICD-10-CM | POA: Diagnosis not present

## 2020-06-25 ENCOUNTER — Telehealth (INDEPENDENT_AMBULATORY_CARE_PROVIDER_SITE_OTHER): Payer: Medicare Other | Admitting: Psychiatry

## 2020-06-25 ENCOUNTER — Telehealth: Payer: Self-pay

## 2020-06-25 ENCOUNTER — Other Ambulatory Visit: Payer: Self-pay

## 2020-06-25 ENCOUNTER — Encounter: Payer: Self-pay | Admitting: Psychiatry

## 2020-06-25 DIAGNOSIS — G4701 Insomnia due to medical condition: Secondary | ICD-10-CM | POA: Diagnosis not present

## 2020-06-25 DIAGNOSIS — Z79899 Other long term (current) drug therapy: Secondary | ICD-10-CM | POA: Diagnosis not present

## 2020-06-25 DIAGNOSIS — F3176 Bipolar disorder, in full remission, most recent episode depressed: Secondary | ICD-10-CM

## 2020-06-25 DIAGNOSIS — Z9189 Other specified personal risk factors, not elsewhere classified: Secondary | ICD-10-CM | POA: Diagnosis not present

## 2020-06-25 NOTE — Progress Notes (Signed)
Virtual Visit via Telephone Note  I connected with Luis Miller. on 06/25/20 at 10:00 AM EDT by telephone and verified that I am speaking with the correct person using two identifiers.  Location Provider Location : [facility] Patient Location : Home  Participants: Patient , Provider   I discussed the limitations, risks, security and privacy concerns of performing an evaluation and management service by telephone and the availability of in person appointments. I also discussed with the patient that there may be a patient responsible charge related to this service. The patient expressed understanding and agreed to proceed.    I discussed the assessment and treatment plan with the patient. The patient was provided an opportunity to ask questions and all were answered. The patient agreed with the plan and demonstrated an understanding of the instructions.   The patient was advised to call back or seek an in-person evaluation if the symptoms worsen or if the condition fails to improve as anticipated.   [place] MD OP Progress Note  06/25/2020 1:00 PM Luis Miller.  MRN:  [id number]  Chief Complaint:  Chief Complaint    Follow-up; Depression; Insomnia     HPI: Luis Miller. is a 75 year old Caucasian male, divorced, retired, lives in [place], has a history of bipolar disorder, currently in remission, chronic kidney disease stage IV multiple other medical problems was evaluated by telemedicine today.  Patient today reports he is currently doing well.  Patient denies any suicidality or homicidality or perceptual disturbances.  Patient reports he continues to have restlessness at night because he has to wake up several times to urinate.  He however has gotten used to it and reports overall sleep is okay.  Patient denies any sadness, anxiety attacks.  Patient is compliant on medications and denies side effects.  Patient had all his labs as well as EKG completed.  Reviewed and discussed  the same with patient.  Patient denies any other concerns today.  Visit Diagnosis:    ICD-10-CM   1. Bipolar disorder, in full remission, most recent episode depressed ([place])  F31.76   2. Insomnia due to medical condition  G47.01   3. High risk medication use  Z79.899   4. At risk for prolonged QT interval syndrome  Z91.89     Past Psychiatric History: I have reviewed past psychiatric history from my progress note on 04/24/2020.  Past trials of medications like lithium, Ambien, Sonata, Seroquel, mirtazapine, Strattera, lithium  Past Medical History:  Past Medical History:  Diagnosis Date  . Acute renal failure (ARF) ([place]) 08/02/2019  . Anemia   . Apnea, sleep    CPAP  . Arthritis   . Bipolar affective ([place])   . Cancer ([place]) malignant melanoma  . COVID-19 12/17/2019  . CRI (chronic renal insufficiency)   . Crohn's disease ([place])   . Depression   . Diabetes insipidus ([place])   . Erectile dysfunction   . Gout   . Hyperlipidemia   . Hypertension   . IBS (irritable bowel syndrome)   . Impotence   . Internal hemorrhoids   . 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[place])   . Paraphimosis   . Peyronie disease   . Secondary hyperparathyroidism of renal origin ([place])   . Skin cancer   . Testicular hypofunction   . Urinary frequency   . Urinary hesitancy   . Wears hearing aid in both ears     Past Surgical History:  Procedure Laterality Date  . arm fracture    .  BONE MARROW BIOPSY    . CATARACT EXTRACTION W/PHACO Right 04/09/2020   Procedure: CATARACT EXTRACTION PHACO AND INTRAOCULAR LENS PLACEMENT (IOC) RIGHT EYHANCE TORIC 6.84 01:03.7 10.7%;  Surgeon: Leandrew Koyanagi, MD;  Location: [facility];  Service: Ophthalmology;  Laterality: Right;  sleep apnea  . CATARACT EXTRACTION W/PHACO Left 05/07/2020   Procedure: CATARACT EXTRACTION PHACO AND INTRAOCULAR LENS PLACEMENT (IOC) LEFT EYHANCE TORIC 2.93 00:58.7 5.0%;  Surgeon: Leandrew Koyanagi, MD;  Location: [facility];   Service: Ophthalmology;  Laterality: Left;  . COLONOSCOPY WITH PROPOFOL N/A 12/23/2014   Procedure: COLONOSCOPY WITH PROPOFOL;  Surgeon: Manya Silvas, MD;  Location[facility] ENDOSCOPY;  Service: Endoscopy;  Laterality: N/A;  . COLONOSCOPY WITH PROPOFOL N/A 02/06/2018   Procedure: COLONOSCOPY WITH PROPOFOL;  Surgeon: Manya Silvas, MD;  Location[facility]COPY;  Service: Endoscopy;  Laterality: N/A;  . KNEE ARTHROPLASTY Left 02/22/2018   Procedure: COMPUTER ASSISTED TOTAL KNEE ARTHROPLASTY;  Surgeon: Dereck Leep, MD;  Location: [facility] ORS;  Service: Orthopedics;  Laterality: Left;  Marland Kitchen MELANOMA EXCISION    . PENILE PROSTHESIS IMPLANT    . PENILE PROSTHESIS IMPLANT N/A 08/25/2015   Procedure: REPLACEMENT OF INFLATABLE PENILE PROSTHESIS COMPONENTS;  Surgeon: Cleon Gustin, MD;  Location: [facility] ORS;  Service: Urology;  Laterality: N/A;  . SKIN CANCER EXCISION     nose  . TONSILLECTOMY    . VASECTOMY      Family Psychiatric History: I have reviewed family psychiatric history from my progress note on 04/24/2020  Family History:  Family History  Problem Relation Age of Onset  . Depression Sister   . Bipolar disorder Other   . Prostate cancer Neg Hx   . Kidney cancer Neg Hx   . Bladder Cancer Neg Hx     Social History: I have reviewed social history from my progress note on 04/24/2020 Social History   Socioeconomic History  . Marital status: Divorced    Spouse name: Not on file  . Number of children: 2  . Years of education: Not on file  . Highest education level: High school graduate  Occupational History  . Occupation: retired  Tobacco Use  . Smoking status: Former Smoker    Types: Cigarettes    Quit date: 09/08/1972    Years since quitting: 47.8  . Smokeless tobacco: Never Used  Vaping Use  . Vaping Use: Never used  Substance and Sexual Activity  . Alcohol use: Not Currently    Alcohol/week: 0.0 standard drinks    Comment: Occasional glass of wine  . Drug use: No  .  Sexual activity: Yes    Partners: Female    Birth control/protection: None  Other Topics Concern  . Not on file  Social History Narrative  . Not on file   Social Determinants of Health   Financial Resource Strain: Low Risk   . Difficulty of Paying Living Expenses: Not hard at all  Food Insecurity: No Food Insecurity  . Worried About Charity fundraiser in the Last Year: Never true  . Ran Out of Food in the Last Year: Never true  Transportation Needs: No Transportation Needs  . Lack of Transportation (Medical): No  . Lack of Transportation (Non-Medical): No  Physical Activity: Sufficiently Active  . Days of Exercise per Week: 7 days  . Minutes of Exercise per Session: 30 min  Stress: No Stress Concern Present  . Feeling of Stress : Not at all  Social Connections: Not on file  Allergies:  Allergies  Allergen Reactions  . Mirtazapine Rash  . Aspirin     Other reaction(s): Unknown Patient was instructed not to take aspirin but can not remember why  . Atomoxetine Other (See Comments)    Urinary sx  . Strattera [Atomoxetine Hcl] Other (See Comments)    Urinary sx  . Xyzal [Levocetirizine Dihydrochloride] Rash    Patient reported on 5/32/99    Metabolic Disorder Labs: Lab Results  Component Value Date   HGBA1C 5.2 05/19/2020   Lab Results  Component Value Date   PROLACTIN 15.4 (H) 05/19/2020   Lab Results  Component Value Date   CHOL 227 (H) 05/19/2020   TRIG 147 05/19/2020   HDL 45 05/19/2020   CHOLHDL 5.0 05/19/2020   VLDL 50 (H) 10/03/2017   LDLCALC 155 (H) 05/19/2020   LDLCALC 130 (H) 04/26/2019   Lab Results  Component Value Date   TSH 0.313 (L) 05/19/2020   TSH 1.226 08/01/2019    Therapeutic Level Labs: Lab Results  Component Value Date   LITHIUM 0.70 08/06/2019   LITHIUM 0.78 08/05/2019   No results found for: VALPROATE No components found for:  CBMZ  Current Medications: Current Outpatient Medications  Medication Sig Dispense Refill  .  allopurinol (ZYLOPRIM) 100 MG tablet Take 200 mg by mouth daily.     Marland Kitchen amLODipine (NORVASC) 10 MG tablet Take 1 tablet (10 mg total) by mouth daily. 90 tablet 1  . benazepril (LOTENSIN) 40 MG tablet Take 40 mg by mouth daily.     Marland Kitchen FLUoxetine (PROZAC) 10 MG capsule Take 1 capsule (10 mg total) by mouth daily. 90 capsule 1  . hydrALAZINE (APRESOLINE) 100 MG tablet Take 100 mg by mouth 3 (three) times daily.    Marland Kitchen loperamide (IMODIUM) 2 MG capsule Take 2 mg by mouth as needed for diarrhea or loose stools.    . Multiple Vitamin (MULTIVITAMIN) tablet Take 1 tablet by mouth daily.    Marland Kitchen OLANZapine (ZYPREXA) 5 MG tablet Take 1 tablet (5 mg total) by mouth at bedtime. 90 tablet 0  . rosuvastatin (CRESTOR) 5 MG tablet Take 1 tablet (5 mg total) by mouth daily. 90 tablet 1  . traZODone (DESYREL) 100 MG tablet Take 1 tablet (100 mg total) by mouth at bedtime. 90 tablet 0  . Vitamin D, Cholecalciferol, 25 MCG (1000 UT) TABS Take 1,000 tablets by mouth daily.      No current facility-administered medications for this visit.     Musculoskeletal: Strength & Muscle Tone: UTA Gait & Station: UTA Patient leans: N/A  Psychiatric Specialty Exam: Review of Systems  Genitourinary: Positive for frequency.  Psychiatric/Behavioral: Positive for sleep disturbance.  All other systems reviewed and are negative.   There were no vitals taken for this visit.There is no height or weight on file to calculate BMI.  General Appearance: UTA  Eye Contact:  UTA  Speech:  Clear and Coherent  Volume:  Normal  Mood:  Euthymic  Affect:  UTA  Thought Process:  Goal Directed and Descriptions of Associations: Intact  Orientation:  Full (Time, Place, and Person)  Thought Content: Logical   Suicidal Thoughts:  No  Homicidal Thoughts:  No  Memory:  Immediate;   Fair Recent;   Fair Remote;   Fair  Judgement:  Fair  Insight:  Fair  Psychomotor Activity:  UTA  Concentration:  Concentration: Fair and Attention Span: Fair   Recall:  [employer] of Knowledge: Fair  Language: Fair  Akathisia:  No  Handed:  Right  AIMS (if indicated): UTA  Assets:  Communication Skills Desire for Improvement Housing Social Support  ADL's:  Intact  Cognition: WNL  Sleep:  Restless   Screenings: Mini-Mental   Flowsheet Row [facility]  Total Score (max 30 points ) 28    PHQ2-9   Flowsheet Row Video Visit from 06/25/2020 in [facility] from 11/30/2019 in [facility] from 11/20/2018 in [facility] Visit from 04/24/2018 in [facility] from 11/14/2017 in [facility]  PHQ-2 Total Score 0 0 0 0 0  PHQ-9 Total Score -- -- -- 3 --    Flowsheet Row Video Visit from 06/25/2020 in [facility] Admission (Discharged) from 05/07/2020 in [facility] Admission (Discharged) from 04/09/2020 in [facility] PERIOP  C-SSRS RISK CATEGORY No Risk No Risk No Risk       Assessment and Plan: Noal Abshier. is a 75 year old male with history of bipolar disorder, chronic kidney disease stage IV, multiple other medical problems was evaluated by telemedicine today.  Patient is biologically predisposed given his history of mental health problems in his family, history of medical problems.  Patient with psychosocial stressors of his current health issues.  Patient is currently stable.  Plan as noted below.  Plan Bipolar disorder in full remission Olanzapine 5 mg at bedtime Trazodone 100 mg p.o. nightly manage Fluoxetine 10 mg p.o. daily  Insomnia-stable Continue trazodone 100 mg p.o. nightly. Patient does have chronic kidney disease, and frequent urination at night which does make his sleep restless.  High risk medication use-reviewed and discussed the following labs with patient. Lipid  panel-elevated at 227-LDL-elevated at 155-patient was recently started on statin-will continue to follow-up with primary care provider TSH-low at 0.313-patient advised to follow-up with primary care provider for further management Prolactin-slightly elevated at 15.4-we will monitor closely likely due to olanzapine. CBC with differential-WBC-3.3, RBC-3.17, hemoglobin-11.3, hematocrit-31.8, MCV-100, MCH-35.6, platelet-128-abnormal patient with chronic kidney disease-will continue to follow-up with primary care provider Vitamin B12-within normal limits 572 Hemoglobin A1c-within normal limits at 5.2  At risk for prolonged QT syndrome-EKG reviewed from 05/19/2020-normal sinus rhythm   I have spent at least 20 minutes non face to face with patient today which includes the time spent for preparing to see the patient  (e.g., review of test, records), ordering medications and test, psychoeducation and supportive psychotherapy, care coordination as well as documenting clinical information in electronic health record, review and interpretation of labs, EKG.     Follow-up in clinic in 3 months in person.  This note was generated in part or whole with voice recognition software. Voice recognition is usually quite accurate but there are transcription errors that can and very often do occur. I apologize for any typographical errors that were not detected and corrected.     Ursula Alert, MD 06/25/2020, 1:00 PM

## 2020-06-25 NOTE — Telephone Encounter (Signed)
Patient was informed to contact PCP about thyroid and sed rate

## 2020-06-25 NOTE — Telephone Encounter (Signed)
Copied from [place] [id number]. Topic: General - Other >> Jun 25, 2020 10:39 AM Yvette Rack wrote: Reason for CRM: Pt requests call back to discuss most recent lab results.

## 2020-06-26 ENCOUNTER — Other Ambulatory Visit
Admission: RE | Admit: 2020-06-26 | Discharge: 2020-06-26 | Disposition: A | Discharge status: Home / Self Care | Payer: Medicare Other | Attending: Ophthalmology | Admitting: Ophthalmology

## 2020-06-26 DIAGNOSIS — H47012 Ischemic optic neuropathy, left eye: Secondary | ICD-10-CM | POA: Diagnosis not present

## 2020-06-26 LAB — C-REACTIVE PROTEIN: CRP: 1 mg/dL — ABNORMAL HIGH (ref <1.0)

## 2020-06-26 LAB — SEDIMENTATION RATE: Sed Rate: 54 mm/hr — ABNORMAL HIGH (ref 0–20)

## 2020-06-30 ENCOUNTER — Emergency Department
Admission: EM | Admit: 2020-06-30 | Discharge: 2020-06-30 | Disposition: A | Discharge status: Home / Self Care | Payer: Medicare Other | Attending: Emergency Medicine | Admitting: Emergency Medicine

## 2020-06-30 ENCOUNTER — Emergency Department: Payer: Medicare Other

## 2020-06-30 DIAGNOSIS — T675XXA Heat exhaustion, unspecified, initial encounter: Secondary | ICD-10-CM | POA: Diagnosis not present

## 2020-06-30 DIAGNOSIS — R0902 Hypoxemia: Secondary | ICD-10-CM | POA: Diagnosis not present

## 2020-06-30 DIAGNOSIS — R509 Fever, unspecified: Secondary | ICD-10-CM | POA: Diagnosis not present

## 2020-06-30 DIAGNOSIS — T671XXA Heat syncope, initial encounter: Secondary | ICD-10-CM

## 2020-06-30 DIAGNOSIS — E1122 Type 2 diabetes mellitus with diabetic chronic kidney disease: Secondary | ICD-10-CM | POA: Insufficient documentation

## 2020-06-30 DIAGNOSIS — N184 Chronic kidney disease, stage 4 (severe): Secondary | ICD-10-CM | POA: Diagnosis not present

## 2020-06-30 DIAGNOSIS — Z85828 Personal history of other malignant neoplasm of skin: Secondary | ICD-10-CM | POA: Insufficient documentation

## 2020-06-30 DIAGNOSIS — I129 Hypertensive chronic kidney disease with stage 1 through stage 4 chronic kidney disease, or unspecified chronic kidney disease: Secondary | ICD-10-CM | POA: Diagnosis not present

## 2020-06-30 DIAGNOSIS — Z79899 Other long term (current) drug therapy: Secondary | ICD-10-CM | POA: Insufficient documentation

## 2020-06-30 DIAGNOSIS — Z8616 Personal history of COVID-19: Secondary | ICD-10-CM | POA: Insufficient documentation

## 2020-06-30 DIAGNOSIS — R61 Generalized hyperhidrosis: Secondary | ICD-10-CM | POA: Diagnosis not present

## 2020-06-30 DIAGNOSIS — W19XXXA Unspecified fall, initial encounter: Secondary | ICD-10-CM

## 2020-06-30 DIAGNOSIS — Y9271 Barn as the place of occurrence of the external cause: Secondary | ICD-10-CM | POA: Diagnosis not present

## 2020-06-30 DIAGNOSIS — R0689 Other abnormalities of breathing: Secondary | ICD-10-CM | POA: Diagnosis not present

## 2020-06-30 DIAGNOSIS — E860 Dehydration: Secondary | ICD-10-CM

## 2020-06-30 DIAGNOSIS — Z87891 Personal history of nicotine dependence: Secondary | ICD-10-CM | POA: Insufficient documentation

## 2020-06-30 DIAGNOSIS — Z96652 Presence of left artificial knee joint: Secondary | ICD-10-CM | POA: Diagnosis not present

## 2020-06-30 DIAGNOSIS — R55 Syncope and collapse: Secondary | ICD-10-CM | POA: Insufficient documentation

## 2020-06-30 DIAGNOSIS — R000 Tachycardia, unspecified: Secondary | ICD-10-CM | POA: Diagnosis not present

## 2020-06-30 DIAGNOSIS — W010XXA Fall on same level from slipping, tripping and stumbling without subsequent striking against object, initial encounter: Secondary | ICD-10-CM | POA: Insufficient documentation

## 2020-06-30 DIAGNOSIS — R531 Weakness: Secondary | ICD-10-CM | POA: Diagnosis not present

## 2020-06-30 LAB — CBC WITH DIFFERENTIAL/PLATELET
Abs Immature Granulocytes: 0.06 10*3/uL (ref 0.00–0.07)
Basophils Absolute: 0 10*3/uL (ref 0.0–0.1)
Basophils Relative: 0 %
Eosinophils Absolute: 0 10*3/uL (ref 0.0–0.5)
Eosinophils Relative: 0 %
HCT: 27.9 % — ABNORMAL LOW (ref 39.0–52.0)
Hemoglobin: 9.7 g/dL — ABNORMAL LOW (ref 13.0–17.0)
Immature Granulocytes: 1 %
Lymphocytes Relative: 11 %
Lymphs Abs: 0.8 10*3/uL (ref 0.7–4.0)
MCH: 34.3 pg — ABNORMAL HIGH (ref 26.0–34.0)
MCHC: 34.8 g/dL (ref 30.0–36.0)
MCV: 98.6 fL (ref 80.0–100.0)
Monocytes Absolute: 0.6 10*3/uL (ref 0.1–1.0)
Monocytes Relative: 8 %
Neutro Abs: 6.1 10*3/uL (ref 1.7–7.7)
Neutrophils Relative %: 80 %
Platelets: 131 10*3/uL — ABNORMAL LOW (ref 150–400)
RBC: 2.83 MIL/uL — ABNORMAL LOW (ref 4.22–5.81)
RDW: 13.7 % (ref 11.5–15.5)
WBC: 7.6 10*3/uL (ref 4.0–10.5)
nRBC: 0 % (ref 0.0–0.2)

## 2020-06-30 LAB — COMPREHENSIVE METABOLIC PANEL
ALT: 22 U/L (ref 0–44)
AST: 27 U/L (ref 15–41)
Albumin: 3.6 g/dL (ref 3.5–5.0)
Alkaline Phosphatase: 56 U/L (ref 38–126)
Anion gap: 11 (ref 5–15)
BUN: 69 mg/dL — ABNORMAL HIGH (ref 8–23)
CO2: 22 mmol/L (ref 22–32)
Calcium: 8.6 mg/dL — ABNORMAL LOW (ref 8.9–10.3)
Chloride: 111 mmol/L (ref 98–111)
Creatinine, Ser: 3.89 mg/dL — ABNORMAL HIGH (ref 0.61–1.24)
GFR, Estimated: 15 mL/min — ABNORMAL LOW (ref >60)
Glucose, Bld: 94 mg/dL (ref 70–99)
Potassium: 3.1 mmol/L — ABNORMAL LOW (ref 3.5–5.1)
Sodium: 144 mmol/L (ref 135–145)
Total Bilirubin: 0.5 mg/dL (ref 0.3–1.2)
Total Protein: 6.3 g/dL — ABNORMAL LOW (ref 6.5–8.1)

## 2020-06-30 LAB — LACTIC ACID, PLASMA
Lactic Acid, Venous: 2.4 mmol/L (ref 0.5–1.9)
Lactic Acid, Venous: 1.9 mmol/L (ref 0.5–1.9)

## 2020-06-30 LAB — TROPONIN I (HIGH SENSITIVITY)
Troponin I (High Sensitivity): 30 ng/L — ABNORMAL HIGH (ref <18)
Troponin I (High Sensitivity): 33 ng/L — ABNORMAL HIGH (ref <18)

## 2020-06-30 MED ORDER — LACTATED RINGERS IV BOLUS
1000.0000 mL | Freq: Once | INTRAVENOUS | Status: AC
  Administered 2020-06-30: 1000 mL via INTRAVENOUS

## 2020-06-30 MED ORDER — ACETAMINOPHEN 500 MG PO TABS
1000.0000 mg | ORAL_TABLET | Freq: Once | ORAL | Status: AC
  Administered 2020-06-30: 1000 mg via ORAL
  Filled 2020-06-30: qty 2

## 2020-06-30 NOTE — ED Provider Notes (Signed)
[facility] Emergency Department Provider Note ____________________________________________   Event Date/Time   First MD Initiated Contact with Patient 06/30/20 1659     (approximate)  I have reviewed the triage vital signs and the nursing notes.  HISTORY  Chief Complaint Fall (Unknown down time fell in shed, 102.0 temp no complaints per pt, pt tripped and fell )   HPI Luis Miller. is a 75 y.o. Luis Miller presents to the ED for evaluation of a fall and weakness.  Chart review indicates history of HTN, HLD, depression and bipolar disorder, gout, CKD 4.  Patient presents to the ED for evaluation of an episode of weakness, fall and difficulty getting up.  Patient reports being in his typical state of health recently without illnesses, medication changes.  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  Of note, today has been the hottest day of the year and in the 80s degrees Fahrenheit.  Patient received office this morning and lunch, but he did not have his walker with him while he was working this afternoon.  Patient presents with his pastor, who remains at the bedside and provides additional history.  Past reports that he is acting normally and he saw him earlier today while patient was starting his work outside.  Luis Miller indicates that he must of been outside working for an hour and a half.  Patient reports feeling a sensation of generalized weakness, falling to the side while in the shed getting some tools.  He reports catching himself.  Denies specific trauma such as head trauma or extremity trauma.  He reports he thinks he got lodged between the lawnmower and the wall, having difficulty getting up from this.  He called for help from bystanders, who assisted him up and called 911 for evaluation.  Noted to be febrile with EMS 102 F.  Here in the ED, patient reports feeling well and is requesting discharge.  Past has  no concerns about his mental status   Past Medical History:  Diagnosis Date  . Acute renal failure (ARF) ([place]) 08/02/2019  . Anemia   . Apnea, sleep    CPAP  . Arthritis   . Bipolar affective ([place])   . Cancer ([place]) malignant melanoma  . COVID-19 12/17/2019  . CRI (chronic renal insufficiency)   . Crohn's disease ([place])   . Depression   . Diabetes insipidus ([place])   . Erectile dysfunction   . Gout   . Hyperlipidemia   . Hypertension   . IBS (irritable bowel syndrome)   . Impotence   . Internal hemorrhoids   . 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 Paraphimosis   . Peyronie disease   . Secondary hyperparathyroidism of renal origin ([place])   . Skin cancer   . Testicular hypofunction   . Urinary frequency   . Urinary hesitancy   . Wears hearing aid in both ears     Patient Active Problem List   Diagnosis Date Noted  . Memory loss 05/19/2020  . Hyperlipidemia 05/19/2020  . At risk for prolonged QT interval syndrome 04/24/2020  . Insomnia due to medical condition 04/24/2020  . 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 Lithium toxicity 08/05/2019  . Acute renal failure superimposed on stage 4 chronic kidney disease ([place]) 08/01/2019  . Insomnia disorder related to known organic factor 02/19/2019  . Essential hypertension 02/01/2019  . 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 Hemorrhoids, internal 12/12/2017  . Hx  of adenomatous colonic polyps 12/05/2017  . Thrombosed external hemorrhoid 12/05/2017  . Drug-induced tremor 05/08/2017  . Diabetes insipidus ([place]) 04/05/2017  . Secondary hyperparathyroidism of renal origin ([place]) 04/05/2017  . Advanced care planning/counseling discussion 04/05/2017  . Carotid bruit 04/05/2017  . Bradycardia 11/08/2016  . Gout 02/26/2016  . Erectile dysfunction due to arterial insufficiency 07/08/2015  . Obesity 11/27/2014  . CKD (chronic kidney disease) stage 4, GFR 15-29 ml/min (HCC) 11/27/2014  . Acute gout due to renal impairment involving  right foot 11/22/2014  . 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  . Bipolar disorder, in full remission, most recent episode depressed ([place]) 09/09/2014  . Impotence 09/09/2014  . High risk medication use 10/18/2013  . Abnormal thyroid function test 10/01/2013  . Hypertrophy of prostate with urinary obstruction and other lower urinary tract symptoms (LUTS) 01/13/2012  . Elevated prostate specific antigen (PSA) 01/13/2012  . Benign prostatic hyperplasia with urinary obstruction 01/13/2012  . ED (erectile dysfunction) of organic origin 01/13/2012  . Nocturia 01/13/2012  . 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 Peyronie's disease 01/13/2012  . Increased frequency of urination 01/13/2012  . Urinary hesitancy 01/13/2012    Past Surgical History:  Procedure Laterality Date  . arm fracture    . BONE MARROW BIOPSY    . CATARACT EXTRACTION W/PHACO Right 04/09/2020   Procedure: CATARACT EXTRACTION PHACO AND INTRAOCULAR LENS PLACEMENT (IOC) RIGHT EYHANCE TORIC 6.84 01:03.7 10.7%;  Surgeon: Leandrew Koyanagi, MD;  Location: [place];  Service: Ophthalmology;  Laterality: Right;  sleep apnea  . CATARACT EXTRACTION W/PHACO Left 05/07/2020   Procedure: CATARACT EXTRACTION PHACO AND INTRAOCULAR LENS PLACEMENT (IOC) LEFT EYHANCE TORIC 2.93 00:58.7 5.0%;  Surgeon: Leandrew Koyanagi, MD;  Location: [place];  Service: Ophthalmology;  Laterality: Left;  . COLONOSCOPY WITH PROPOFOL N/A 12/23/2014   Procedure: COLONOSCOPY WITH PROPOFOL;  Surgeon: Manya Silvas, MD;  Location: [facility] ENDOSCOPY;  Service: Endoscopy;  Laterality: N/A;  . COLONOSCOPY WITH PROPOFOL N/A 02/06/2018   Procedure: COLONOSCOPY WITH PROPOFOL;  Surgeon: Manya Silvas, MD;  Location: [facility] ENDOSCOPY;  Service: Endoscopy;  Laterality: N/A;  . KNEE ARTHROPLASTY Left 02/22/2018   Procedure: COMPUTER ASSISTED TOTAL KNEE ARTHROPLASTY;  Surgeon: Dereck Leep, MD;  Location: [facility] ORS;  Service: Orthopedics;  Laterality:  Left;  Marland Kitchen MELANOMA EXCISION    . PENILE PROSTHESIS IMPLANT    . PENILE PROSTHESIS IMPLANT N/A 08/25/2015   Procedure: REPLACEMENT OF INFLATABLE PENILE PROSTHESIS COMPONENTS;  Surgeon: Cleon Gustin, MD;  Location: [facility];  Service: Urology;  Laterality: N/A;  . SKIN CANCER EXCISION     nose  . TONSILLECTOMY    . VASECTOMY      Prior to Admission medications   Medication Sig Start Date End Date Taking? Authorizing Provider  allopurinol (ZYLOPRIM) 100 MG tablet Take 200 mg by mouth daily.  12/11/15   [provider]  amLODipine (NORVASC) 10 MG tablet Take 1 tablet (10 mg total) by mouth daily. 05/19/20   Jon Billings, NP  benazepril (LOTENSIN) 40 MG tablet Take 40 mg by mouth daily.  08/27/14   [provider]  FLUoxetine (PROZAC) 10 MG capsule Take 1 capsule (10 mg total) by mouth daily. 02/20/20   Nevada Crane, MD  hydrALAZINE (APRESOLINE) 100 MG tablet Take 100 mg by mouth 3 (three) times daily.    [provider]  loperamide (IMODIUM) 2 MG capsule Take 2 mg by mouth as needed for diarrhea or loose stools.    [provider]  Multiple  Vitamin (MULTIVITAMIN) tablet Take 1 tablet by mouth daily.    [provider]  OLANZapine (ZYPREXA) 5 MG tablet Take 1 tablet (5 mg total) by mouth at bedtime. 04/24/20   Ursula Alert, MD  rosuvastatin (CRESTOR) 5 MG tablet Take 1 tablet (5 mg total) by mouth daily. 05/19/20   Jon Billings, NP  traZODone (DESYREL) 100 MG tablet Take 1 tablet (100 mg total) by mouth at bedtime. 04/24/20   Ursula Alert, MD  Vitamin D, Cholecalciferol, 25 MCG (1000 UT) TABS Take 1,000 tablets by mouth daily.     [provider]    Allergies Mirtazapine, Aspirin, Atomoxetine, Strattera [atomoxetine hcl], and Xyzal [levocetirizine dihydrochloride]  Family History  Problem Relation Age of Onset  . Depression Sister   . Bipolar disorder Other   . Prostate cancer Neg Hx   . Kidney cancer Neg Hx   . Bladder  Cancer Neg Hx     Social History Social History   Tobacco Use  . Smoking status: Former Smoker    Types: Cigarettes    Quit date: 09/08/1972    Years since quitting: 47.8  . Smokeless tobacco: Never Used  Vaping Use  . Vaping Use: Never used  Substance Use Topics  . Alcohol use: Not Currently    Alcohol/week: 0.0 standard drinks    Comment: Occasional glass of wine  . Drug use: No    Review of Systems  Constitutional: No fever/chills.  Positive for generalized weakness Eyes: No visual changes. ENT: No sore throat. Cardiovascular: Denies chest pain. Respiratory: Denies shortness of breath. Gastrointestinal: No abdominal pain.  No nausea, no vomiting.  No diarrhea.  No constipation. Genitourinary: Negative for dysuria. Musculoskeletal: Negative for back pain. Skin: Negative for rash. Neurological: Negative for headaches, focal weakness or numbness.  ____________________________________________   PHYSICAL EXAM:  VITAL SIGNS: Vitals:   06/30/20 1940 06/30/20 2005  BP: (!) 147/81 (!) 144/77  Pulse: 99 90  Resp: 18 17  Temp:    SpO2: 98% 98%     Constitutional: Alert and oriented. Well appearing and in no acute distress.  Warm to the touch.  Pleasant and conversational full sentences. Eyes: Conjunctivae are normal. PERRL. EOMI. Head: Atraumatic. Nose: No congestion/rhinnorhea. Mouth/Throat: Mucous membranes are moist.  Oropharynx non-erythematous. Neck: No stridor. No cervical spine tenderness to palpation. Cardiovascular: Normal rate, regular rhythm. Grossly normal heart sounds.  Good peripheral circulation. Respiratory: Normal respiratory effort.  No retractions. Lungs CTAB. Gastrointestinal: Soft , nondistended, nontender to palpation. No CVA tenderness. Musculoskeletal: No lower extremity tenderness nor edema.  No joint effusions. No signs of acute trauma. Neurologic:  Normal speech and language. No gross focal neurologic deficits are appreciated. No gait  instability noted. Cranial nerves II through XII intact 5/5 strength and sensation in all 4 extremities Skin:  Skin is warm, dry and intact. No rash noted. Psychiatric: Mood and affect are normal. Speech and behavior are normal.  ____________________________________________   LABS (all labs ordered are listed, but only abnormal results are displayed)  Labs Reviewed  COMPREHENSIVE METABOLIC PANEL - Abnormal; Notable for the following components:      Result Value   Potassium 3.1 (*)    BUN 69 (*)    Creatinine, Ser 3.89 (*)    Calcium 8.6 (*)    Total Protein 6.3 (*)    GFR, Estimated 15 (*)    All other components within normal limits  CBC WITH DIFFERENTIAL/PLATELET - Abnormal; Notable for the following components:   RBC 2.83 (*)  Hemoglobin 9.7 (*)    HCT 27.9 (*)    MCH 34.3 (*)    Platelets 131 (*)    All other components within normal limits  LACTIC ACID, PLASMA - Abnormal; Notable for the following components:   Lactic Acid, Venous 2.4 (*)    All other components within normal limits  TROPONIN I (HIGH SENSITIVITY) - Abnormal; Notable for the following components:   Troponin I (High Sensitivity) 30 (*)    All other components within normal limits  TROPONIN I (HIGH SENSITIVITY) - Abnormal; Notable for the following components:   Troponin I (High Sensitivity) 33 (*)    All other components within normal limits  LACTIC ACID, PLASMA   ____________________________________________  12 Lead EKG  Sinus rhythm, rate of 94 bpm.  Normal axis intervals.  No evidence of acute ischemia. ____________________________________________  RADIOLOGY  ED MD interpretation: 2 view CXR reviewed by me without evidence of acute cardiopulmonary pathology.  Official radiology report(s): DG Chest 2 View  Result Date: 06/30/2020 CLINICAL DATA:  Fever and hypoxia. EXAM: CHEST - 2 VIEW COMPARISON:  Chest x-ray dated Aug 01, 2019. FINDINGS: The heart size and mediastinal contours are within  normal limits. Normal pulmonary vascularity. No focal consolidation, pleural effusion, or pneumothorax. No acute osseous abnormality. Old bilateral rib fractures again noted. IMPRESSION: 1. No acute cardiopulmonary disease. Electronically Signed   By: Titus Dubin M.D.   On: 06/30/2020 18:04    ____________________________________________   PROCEDURES and INTERVENTIONS  Procedure(s) performed (including Critical Care):  .1-3 Lead EKG Interpretation Performed by: Vladimir Crofts, MD Authorized by: Vladimir Crofts, MD     Interpretation: abnormal     ECG rate:  110   ECG rate assessment: tachycardic     Rhythm: sinus tachycardia     Ectopy: none     Conduction: normal      Medications  acetaminophen (TYLENOL) tablet 1,000 mg (1,000 mg Oral Given 06/30/20 1713)  lactated ringers bolus 1,000 mL (0 mLs Intravenous Stopped 06/30/20 1815)    ____________________________________________   MDM / ED COURSE   75 year old male presents to the ED febrile after falling while working outside, difficulty getting up, likely a heat related illness without evidence of further acute pathology and ultimately amenable to outpatient management.  Patient presents febrile and tachycardic, but hemodynamically stable.  Exam is reassuring without evidence of distress, medical organic pathology, neurologic or vascular deficits.  No signs of trauma.  Blood work does demonstrate a mild initial lactic acid, CKD and chronic anemia at baseline.  He remains asymptomatic throughout his stay in the ED, tolerating p.o. intake and repeatedly requesting discharge.  Defervescence with Tylenol and IV fluids.  Resolution of lactic acid.  I see no evidence of sepsis or medical organic pathology causing his fever.  Suspect heat related illness from exposure outside of this hot day today.  Return precautions for the ED were discussed and patient stable for outpatient management.   Clinical Course as of 07/01/20 0024  Mon Jun 30, 2020  1931 Reassessed.  Patient was feeling better.  He is tolerating meal tray right now.  Awaiting repeat lactic.  He is requesting discharge. [DS]    Clinical Course User Index [DS] Vladimir Crofts, MD    ____________________________________________   FINAL CLINICAL IMPRESSION(S) / ED DIAGNOSES  Final diagnoses:  Fall, initial encounter  Dehydration  Heat syncope, initial encounter     ED Discharge Orders    None       Kasem Mozer Tamala Julian  Note:  This document was prepared using Dragon voice recognition software and may include unintentional dictation errors.   Vladimir Crofts, MD 07/01/20 [phone]

## 2020-06-30 NOTE — Discharge Instructions (Signed)
Drink plenty of fluids and get some rest tonight.  Use Tylenol for pain and fevers.  Up to 1000 mg per dose, up to 4 times per day.  Do not take more than 4000 mg of Tylenol/acetaminophen within 24 hours..  If you develop any further worsening weakness, severe muscle cramps, please return to the ED.

## 2020-06-30 NOTE — ED Triage Notes (Signed)
Pt tripped and fell in shed, unable to get up, pt has temp of 102.0, no complaints per pt

## 2020-06-30 NOTE — ED Notes (Signed)
No key pad to sign discharge, pt verbalizes dc instructions , pt axox4, ambulatory, all questions answered,

## 2020-07-01 DIAGNOSIS — H47012 Ischemic optic neuropathy, left eye: Secondary | ICD-10-CM | POA: Diagnosis not present

## 2020-07-02 NOTE — Progress Notes (Signed)
MRN : [id number]  Luis Miller. is a 75 y.o. (04-Jan-1946) male who presents with chief complaint of No chief complaint on file. Marland Kitchen  History of Present Illness:   Patient is sent for evaluation of visual loss and the possible need for temporal artery biopsy.  Patient notes the vision loss is located left eye The patient describes the visual change as total at first but now he is seeing sparkles or flashes The patient denies any associated headache The changes more than 1 week.  ESR and CRP were ordered and noted to be mildly elevated ERS=54 and CRP=1.0  As noted above the patient denies headache.  He also denies jaw claudication.  He denies tenderness over the temporal region.  There is no history of autoimmune disease in his past.  There is no family history of autoimmune disease.  Carotid duplex obtained today stat demonstrates less than 30% bilateral ICA stenosis.  No outpatient medications have been marked as taking for the 07/03/20 encounter (Appointment) with Delana Meyer, Dolores Lory, MD.    Past Medical History:  Diagnosis Date  . Acute renal failure (ARF) ([place]) 08/02/2019  . Anemia   . Apnea, sleep    CPAP  . Arthritis   . Bipolar affective ([place])   . Cancer ([place]) malignant melanoma  . COVID-19 12/17/2019  . CRI (chronic renal insufficiency)   . Crohn's disease ([place])   . Depression   . Diabetes insipidus ([place])   . Erectile dysfunction   . Gout   . Hyperlipidemia   . Hypertension   . IBS (irritable bowel syndrome)   . Impotence   . Internal hemorrhoids   . 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[place])   . Paraphimosis   . Peyronie disease   . Secondary hyperparathyroidism of renal origin ([place])   . Skin cancer   . Testicular hypofunction   . Urinary frequency   . Urinary hesitancy   . Wears hearing aid in both ears     Past Surgical History:  Procedure Laterality Date  . arm fracture    . BONE MARROW BIOPSY    . CATARACT EXTRACTION W/PHACO Right 04/09/2020    Procedure: CATARACT EXTRACTION PHACO AND INTRAOCULAR LENS PLACEMENT (IOC) RIGHT EYHANCE TORIC 6.84 01:03.7 10.7%;  Surgeon: Leandrew Koyanagi, MD;  Location: [place];  Service: Ophthalmology;  Laterality: Right;  sleep apnea  . CATARACT EXTRACTION W/PHACO Left 05/07/2020   Procedure: CATARACT EXTRACTION PHACO AND INTRAOCULAR LENS PLACEMENT (IOC) LEFT EYHANCE TORIC 2.93 00:58.7 5.0%;  Surgeon: Leandrew Koyanagi, MD;  Location: [place];  Service: Ophthalmology;  Laterality: Left;  . COLONOSCOPY WITH PROPOFOL N/A 12/23/2014   Procedure: COLONOSCOPY WITH PROPOFOL;  Surgeon: Manya Silvas, MD;  Location: [facility] ENDOSCOPY;  Service: Endoscopy;  Laterality: N/A;  . COLONOSCOPY WITH PROPOFOL N/A 02/06/2018   Procedure: COLONOSCOPY WITH PROPOFOL;  Surgeon: Manya Silvas, MD;  Location: [facility] ENDOSCOPY;  Service: Endoscopy;  Laterality: N/A;  . KNEE ARTHROPLASTY Left 02/22/2018   Procedure: COMPUTER ASSISTED TOTAL KNEE ARTHROPLASTY;  Surgeon: Dereck Leep, MD;  Location: [facility]S;  Service: Orthopedics;  Laterality: Left;  Marland Kitchen MELANOMA EXCISION    . PENILE PROSTHESIS IMPLANT    . PENILE PROSTHESIS IMPLANT N/A 08/25/2015   Procedure: REPLACEMENT OF INFLATABLE PENILE PROSTHESIS COMPONENTS;  Surgeon: Cleon Gustin, MD;  Location: [facility];  Service: Urology;  Laterality: N/A;  . SKIN CANCER EXCISION     nose  . TONSILLECTOMY    . VASECTOMY  Social History Social History   Tobacco Use  . Smoking status: Former Smoker    Types: Cigarettes    Quit date: 09/08/1972    Years since quitting: 47.8  . Smokeless tobacco: Never Used  Vaping Use  . Vaping Use: Never used  Substance Use Topics  . Alcohol use: Not Currently    Alcohol/week: 0.0 standard drinks    Comment: Occasional glass of wine  . Drug use: No    Family History Family History  Problem Relation Age of Onset  . Depression Sister   . Bipolar disorder Other   . Prostate cancer Neg Hx   . Kidney  cancer Neg Hx   . Bladder Cancer Neg Hx   No family history of bleeding/clotting disorders, porphyria or autoimmune disease   Allergies  Allergen Reactions  . Mirtazapine Rash  . Aspirin     Other reaction(s): Unknown Patient was instructed not to take aspirin but can not remember why  . Atomoxetine Other (See Comments)    Urinary sx  . Strattera [Atomoxetine Hcl] Other (See Comments)    Urinary sx  . Xyzal [Levocetirizine Dihydrochloride] Rash    Patient reported on 11/02/17     REVIEW OF SYSTEMS (Negative unless checked)  Constitutional: [] Weight loss  [] Fever  [] Chills Cardiac: [] Chest pain   [] Chest pressure   [] Palpitations   [] Shortness of breath when laying flat   [] Shortness of breath with exertion. Vascular:  [] Pain in legs with walking   [] Pain in legs at rest  [] History of DVT   [] Phlebitis   [] Swelling in legs   [] Varicose veins   [] Non-healing ulcers Pulmonary:   [] Uses home oxygen   [] Productive cough   [] Hemoptysis   [] Wheeze  [] COPD   [] Asthma Neurologic:  [] Dizziness   [] Seizures   [] History of stroke   [] History of TIA  [] Aphasia   [x] Vissual changes   [] Weakness or numbness in arm   [] Weakness or numbness in leg Musculoskeletal:   [] Joint swelling   [] Joint pain   [] Low back pain Hematologic:  [] Easy bruising  [] Easy bleeding   [] Hypercoagulable state   [] Anemic Gastrointestinal:  [] Diarrhea   [] Vomiting  [] Gastroesophageal reflux/heartburn   [] Difficulty swallowing. Genitourinary:  [] Chronic kidney disease   [] Difficult urination  [] Frequent urination   [] Blood in urine Skin:  [] Rashes   [] Ulcers  Psychological:  [] History of anxiety   []  History of major depression.  Physical Examination  There were no vitals filed for this visit. There is no height or weight on file to calculate BMI. Gen: WD/WN, NAD Head: [place]/AT, No temporalis wasting.  Ear/Nose/Throat: Hearing grossly intact, nares w/o erythema or drainage, poor dentition Eyes: PER, EOMI, sclera  nonicteric.  Neck: Supple, no masses.  No bruit or JVD.  Pulmonary:  Good air movement, clear to auscultation bilaterally, no use of accessory muscles.  Cardiac: RRR, normal S1, S2, no Murmurs. Vascular: Temporal region is nontender to palpation there is a faint left temporal artery pulse noted. Vessel Right Left  Radial Palpable Palpable  Gastrointestinal: soft, non-distended. No guarding/no peritoneal signs.  Musculoskeletal: M/S 5/5 throughout.  No deformity or atrophy.  Neurologic: CN 2-12 intact. Pain and light touch intact in extremities.  Symmetrical.  Speech is fluent. Motor exam as listed above. Psychiatric: Judgment intact, Mood & affect appropriate for pt's clinical situation.  CBC Lab Results  Component Value Date   WBC 7.6 06/30/2020   HGB 9.7 (L) 06/30/2020   HCT 27.9 (L) 06/30/2020   MCV 98.6  06/30/2020   PLT 131 (L) 06/30/2020    BMET    Component Value Date/Time   NA 144 06/30/2020 1708   NA 146 (H) 04/26/2019 1101   K 3.1 (L) 06/30/2020 1708   K 4.1 02/11/2014 1443   CL 111 06/30/2020 1708   CO2 22 06/30/2020 1708   GLUCOSE 94 06/30/2020 1708   BUN 69 (H) 06/30/2020 1708   BUN 50 (H) 04/26/2019 1101   CREATININE 3.89 (H) 06/30/2020 1708   CREATININE 2.22 (H) 03/08/2014 1034   CALCIUM 8.6 (L) 06/30/2020 1708   CALCIUM 9.4 03/08/2014 1034   GFRNONAA 15 (L) 06/30/2020 1708   GFRNONAA 31 (L) 03/08/2014 1034   GFRNONAA 34 (L) 03/07/2013 0954   GFRAA 18 (L) 08/06/2019 0552   GFRAA 38 (L) 03/08/2014 1034   GFRAA 39 (L) 03/07/2013 0954   CrCl cannot be calculated (Unknown ideal weight.).  COAG Lab Results  Component Value Date   INR 0.96 02/08/2018   INR 0.9 02/22/2012    Radiology DG Chest 2 View  Result Date: 06/30/2020 CLINICAL DATA:  Fever and hypoxia. EXAM: CHEST - 2 VIEW COMPARISON:  Chest x-ray dated Aug 01, 2019. FINDINGS: The heart size and mediastinal contours are within normal limits. Normal pulmonary vascularity. No focal consolidation,  pleural effusion, or pneumothorax. No acute osseous abnormality. Old bilateral rib fractures again noted. IMPRESSION: 1. No acute cardiopulmonary disease. Electronically Signed   By: Titus Dubin M.D.   On: 06/30/2020 18:04     Assessment/Plan 1.  Acute visual loss left eye associated with elevation of his inflammatory markers: Patient should undergo left temporal artery biopsy.  The risk and benefits as well as the indications were discussed with the patient in detail.  All questions were answered.  Patient agrees to proceed with biopsy.  1 other consideration was embolic event from a critical carotid lesion.  I therefore obtained a carotid duplex that but this did not show any significant plaque formation excluding this possibility.  2. Essential hypertension Continue antihypertensive medications as already ordered, these medications have been reviewed and there are no changes at this time.   3. CKD (chronic kidney disease) stage 4, GFR 15-29 ml/min (HCC) At the present time the patient has adequate kidney function.  Avoid nephrotoxic medications and dehydration.  Further plans per nephrology  4. Mixed hyperlipidemia Continue statin as ordered and reviewed, no changes at this time    Hortencia Pilar, MD  07/02/2020 9:36 PM

## 2020-07-03 ENCOUNTER — Other Ambulatory Visit (INDEPENDENT_AMBULATORY_CARE_PROVIDER_SITE_OTHER): Payer: Self-pay | Admitting: Vascular Surgery

## 2020-07-03 ENCOUNTER — Other Ambulatory Visit: Payer: Self-pay

## 2020-07-03 ENCOUNTER — Telehealth (INDEPENDENT_AMBULATORY_CARE_PROVIDER_SITE_OTHER): Payer: Self-pay

## 2020-07-03 ENCOUNTER — Encounter (INDEPENDENT_AMBULATORY_CARE_PROVIDER_SITE_OTHER): Payer: Self-pay | Admitting: Vascular Surgery

## 2020-07-03 ENCOUNTER — Ambulatory Visit (INDEPENDENT_AMBULATORY_CARE_PROVIDER_SITE_OTHER): Payer: Medicare Other

## 2020-07-03 ENCOUNTER — Ambulatory Visit (INDEPENDENT_AMBULATORY_CARE_PROVIDER_SITE_OTHER): Payer: Medicare Other | Admitting: Vascular Surgery

## 2020-07-03 VITALS — BP 159/80 | HR 59 | Ht 68.0 in | Wt 196.0 lb

## 2020-07-03 DIAGNOSIS — N184 Chronic kidney disease, stage 4 (severe): Secondary | ICD-10-CM

## 2020-07-03 DIAGNOSIS — E782 Mixed hyperlipidemia: Secondary | ICD-10-CM

## 2020-07-03 DIAGNOSIS — H539 Unspecified visual disturbance: Secondary | ICD-10-CM

## 2020-07-03 DIAGNOSIS — I10 Essential (primary) hypertension: Secondary | ICD-10-CM | POA: Diagnosis not present

## 2020-07-03 NOTE — Telephone Encounter (Signed)
Patient was seen in office and scheduled for a Temporal artery biopsy with Dr. Delana Meyer at the [facility] on 07/04/20. Pre-op and covid testing is on 07/04/20 and the patient was told to arrive at 9:45 am to the [facility]. Patient was given pre-surgical information regarding his surgery. Patient was also called by the hospital and told to be at the hospital at 9:00 am.

## 2020-07-04 ENCOUNTER — Ambulatory Visit: Payer: Medicare Other | Admitting: Anesthesiology

## 2020-07-04 ENCOUNTER — Other Ambulatory Visit
Admission: RE | Admit: 2020-07-04 | Discharge: 2020-07-04 | Disposition: A | Discharge status: Home / Self Care | Payer: Medicare Other | Source: Ambulatory Visit | Attending: Vascular Surgery | Admitting: Vascular Surgery

## 2020-07-04 ENCOUNTER — Encounter: Payer: Self-pay | Admitting: Vascular Surgery

## 2020-07-04 ENCOUNTER — Encounter
Admission: RE | Disposition: A | Discharge status: Home / Self Care | Payer: Self-pay | Source: Home / Self Care | Attending: Vascular Surgery

## 2020-07-04 ENCOUNTER — Other Ambulatory Visit (INDEPENDENT_AMBULATORY_CARE_PROVIDER_SITE_OTHER): Payer: Self-pay | Admitting: Nurse Practitioner

## 2020-07-04 ENCOUNTER — Ambulatory Visit
Admission: RE | Admit: 2020-07-04 | Discharge: 2020-07-04 | Disposition: A | Discharge status: Home / Self Care | Payer: Medicare Other | Attending: Vascular Surgery | Admitting: Vascular Surgery

## 2020-07-04 DIAGNOSIS — R700 Elevated erythrocyte sedimentation rate: Secondary | ICD-10-CM | POA: Diagnosis not present

## 2020-07-04 DIAGNOSIS — E785 Hyperlipidemia, unspecified: Secondary | ICD-10-CM | POA: Diagnosis not present

## 2020-07-04 DIAGNOSIS — H5462 Unqualified visual loss, left eye, normal vision right eye: Secondary | ICD-10-CM | POA: Insufficient documentation

## 2020-07-04 DIAGNOSIS — R7982 Elevated C-reactive protein (CRP): Secondary | ICD-10-CM | POA: Diagnosis not present

## 2020-07-04 DIAGNOSIS — U071 COVID-19: Secondary | ICD-10-CM | POA: Diagnosis not present

## 2020-07-04 DIAGNOSIS — G4730 Sleep apnea, unspecified: Secondary | ICD-10-CM | POA: Diagnosis not present

## 2020-07-04 DIAGNOSIS — M109 Gout, unspecified: Secondary | ICD-10-CM | POA: Diagnosis not present

## 2020-07-04 DIAGNOSIS — M316 Other giant cell arteritis: Secondary | ICD-10-CM | POA: Diagnosis not present

## 2020-07-04 DIAGNOSIS — Z01810 Encounter for preprocedural cardiovascular examination: Secondary | ICD-10-CM | POA: Diagnosis not present

## 2020-07-04 DIAGNOSIS — H547 Unspecified visual loss: Secondary | ICD-10-CM | POA: Diagnosis not present

## 2020-07-04 HISTORY — PX: ARTERY BIOPSY: SHX891

## 2020-07-04 LAB — CBC WITH DIFFERENTIAL/PLATELET
Abs Immature Granulocytes: 0.1 10*3/uL — ABNORMAL HIGH (ref 0.00–0.07)
Basophils Absolute: 0 10*3/uL (ref 0.0–0.1)
Basophils Relative: 0 %
Eosinophils Absolute: 0 10*3/uL (ref 0.0–0.5)
Eosinophils Relative: 0 %
HCT: 32.5 % — ABNORMAL LOW (ref 39.0–52.0)
Hemoglobin: 11.5 g/dL — ABNORMAL LOW (ref 13.0–17.0)
Immature Granulocytes: 1 %
Lymphocytes Relative: 9 %
Lymphs Abs: 0.9 10*3/uL (ref 0.7–4.0)
MCH: 34.7 pg — ABNORMAL HIGH (ref 26.0–34.0)
MCHC: 35.4 g/dL (ref 30.0–36.0)
MCV: 98.2 fL (ref 80.0–100.0)
Monocytes Absolute: 0.7 10*3/uL (ref 0.1–1.0)
Monocytes Relative: 7 %
Neutro Abs: 7.7 10*3/uL (ref 1.7–7.7)
Neutrophils Relative %: 83 %
Platelets: 151 10*3/uL (ref 150–400)
RBC: 3.31 MIL/uL — ABNORMAL LOW (ref 4.22–5.81)
RDW: 13.7 % (ref 11.5–15.5)
WBC: 9.3 10*3/uL (ref 4.0–10.5)
nRBC: 0 % (ref 0.0–0.2)

## 2020-07-04 LAB — BASIC METABOLIC PANEL
Anion gap: 11 (ref 5–15)
BUN: 72 mg/dL — ABNORMAL HIGH (ref 8–23)
CO2: 26 mmol/L (ref 22–32)
Calcium: 9.3 mg/dL (ref 8.9–10.3)
Chloride: 111 mmol/L (ref 98–111)
Creatinine, Ser: 4.18 mg/dL — ABNORMAL HIGH (ref 0.61–1.24)
GFR, Estimated: 14 mL/min — ABNORMAL LOW (ref >60)
Glucose, Bld: 101 mg/dL — ABNORMAL HIGH (ref 70–99)
Potassium: 3.6 mmol/L (ref 3.5–5.1)
Sodium: 148 mmol/L — ABNORMAL HIGH (ref 135–145)

## 2020-07-04 LAB — PROTIME-INR
INR: 1 (ref 0.8–1.2)
Prothrombin Time: 13.6 seconds (ref 11.4–15.2)

## 2020-07-04 LAB — TYPE AND SCREEN
ABO/RH(D): A POS
Antibody Screen: NEGATIVE

## 2020-07-04 LAB — APTT: aPTT: 26 seconds (ref 24–36)

## 2020-07-04 LAB — SARS CORONAVIRUS 2 BY RT PCR (HOSPITAL ORDER, PERFORMED IN ~~LOC~~ HOSPITAL LAB): SARS Coronavirus 2: POSITIVE — AB

## 2020-07-04 SURGERY — BIOPSY TEMPORAL ARTERY
Anesthesia: General | Laterality: Left

## 2020-07-04 MED ORDER — CEFAZOLIN SODIUM-DEXTROSE 2-4 GM/100ML-% IV SOLN
INTRAVENOUS | Status: AC
  Filled 2020-07-04: qty 100

## 2020-07-04 MED ORDER — CHLORHEXIDINE GLUCONATE 0.12 % MT SOLN
OROMUCOSAL | Status: AC
  Administered 2020-07-04: 15 mL via OROMUCOSAL
  Filled 2020-07-04: qty 15

## 2020-07-04 MED ORDER — CHLORHEXIDINE GLUCONATE 0.12 % MT SOLN: 15.0000 mL | Freq: Once | OROMUCOSAL | Status: AC

## 2020-07-04 MED ORDER — GLYCOPYRROLATE 0.2 MG/ML IJ SOLN
INTRAMUSCULAR | Status: AC
  Filled 2020-07-04: qty 2

## 2020-07-04 MED ORDER — ACETAMINOPHEN 10 MG/ML IV SOLN
INTRAVENOUS | Status: AC
  Filled 2020-07-04: qty 100

## 2020-07-04 MED ORDER — BUPIVACAINE HCL (PF) 0.5 % IJ SOLN
INTRAMUSCULAR | Status: AC
  Filled 2020-07-04: qty 30

## 2020-07-04 MED ORDER — ONDANSETRON HCL 4 MG/2ML IJ SOLN: 4.0000 mg | Freq: Four times a day (QID) | INTRAMUSCULAR | Status: DC | PRN

## 2020-07-04 MED ORDER — PROPOFOL 10 MG/ML IV BOLUS
INTRAVENOUS | Status: DC | PRN
  Administered 2020-07-04 (×3): 20 mg via INTRAVENOUS

## 2020-07-04 MED ORDER — HYDROMORPHONE HCL 1 MG/ML IJ SOLN: 1.0000 mg | Freq: Once | INTRAMUSCULAR | Status: DC | PRN

## 2020-07-04 MED ORDER — CHLORHEXIDINE GLUCONATE CLOTH 2 % EX PADS
6.0000 | MEDICATED_PAD | Freq: Once | CUTANEOUS | Status: AC
  Administered 2020-07-04: 6 via TOPICAL

## 2020-07-04 MED ORDER — ACETAMINOPHEN 10 MG/ML IV SOLN
INTRAVENOUS | Status: DC | PRN
  Administered 2020-07-04: 1000 mg via INTRAVENOUS

## 2020-07-04 MED ORDER — HYDROCODONE-ACETAMINOPHEN 5-325 MG PO TABS: 1.0000 | ORAL_TABLET | Freq: Four times a day (QID) | ORAL | 0 refills | Status: DC | PRN

## 2020-07-04 MED ORDER — LACTATED RINGERS IV SOLN: INTRAVENOUS | Status: DC

## 2020-07-04 MED ORDER — LIDOCAINE HCL (PF) 2 % IJ SOLN
INTRAMUSCULAR | Status: DC | PRN
  Administered 2020-07-04: 40 mg via INTRADERMAL

## 2020-07-04 MED ORDER — CEFAZOLIN SODIUM-DEXTROSE 2-4 GM/100ML-% IV SOLN
2.0000 g | INTRAVENOUS | Status: AC
  Administered 2020-07-04: 2 g via INTRAVENOUS

## 2020-07-04 MED ORDER — PROPOFOL 500 MG/50ML IV EMUL
INTRAVENOUS | Status: DC | PRN
  Administered 2020-07-04: 75 ug/kg/min via INTRAVENOUS

## 2020-07-04 MED ORDER — BUPIVACAINE HCL (PF) 0.5 % IJ SOLN
INTRAMUSCULAR | Status: DC | PRN
  Administered 2020-07-04: 4 mL

## 2020-07-04 MED ORDER — LIDOCAINE HCL (PF) 2 % IJ SOLN
INTRAMUSCULAR | Status: AC
  Filled 2020-07-04: qty 15

## 2020-07-04 MED ORDER — LIDOCAINE-EPINEPHRINE 1 %-1:100000 IJ SOLN
INTRAMUSCULAR | Status: DC | PRN
  Administered 2020-07-04: 4 mL

## 2020-07-04 MED ORDER — SODIUM CHLORIDE 0.9 % IV SOLN: INTRAVENOUS | Status: DC | PRN

## 2020-07-04 MED ORDER — PROPOFOL 500 MG/50ML IV EMUL
INTRAVENOUS | Status: AC
  Filled 2020-07-04: qty 50

## 2020-07-04 MED ORDER — ONDANSETRON HCL 4 MG/2ML IJ SOLN
INTRAMUSCULAR | Status: DC | PRN
  Administered 2020-07-04: 4 mg via INTRAVENOUS

## 2020-07-04 MED ORDER — ONDANSETRON HCL 4 MG/2ML IJ SOLN
INTRAMUSCULAR | Status: AC
  Filled 2020-07-04: qty 4

## 2020-07-04 MED ORDER — ORAL CARE MOUTH RINSE: 15.0000 mL | Freq: Once | OROMUCOSAL | Status: AC

## 2020-07-04 MED ORDER — LIDOCAINE-EPINEPHRINE 1 %-1:100000 IJ SOLN
INTRAMUSCULAR | Status: AC
  Filled 2020-07-04: qty 1

## 2020-07-04 MED ORDER — CHLORHEXIDINE GLUCONATE CLOTH 2 % EX PADS: 6.0000 | MEDICATED_PAD | Freq: Once | CUTANEOUS | Status: DC

## 2020-07-04 SURGICAL SUPPLY — 38 items
BLADE CLIPPER SURG (BLADE) ×2 IMPLANT
BLADE SURG 15 STRL LF DISP TIS (BLADE) ×1 IMPLANT
BLADE SURG 15 STRL SS (BLADE) ×1
COTTON BALL STRL MEDIUM (GAUZE/BANDAGES/DRESSINGS) ×2 IMPLANT
COVER WAND RF STERILE (DRAPES) ×2 IMPLANT
DERMABOND ADVANCED (GAUZE/BANDAGES/DRESSINGS) ×1
DERMABOND ADVANCED .7 DNX12 (GAUZE/BANDAGES/DRESSINGS) ×1 IMPLANT
DRAPE LAPAROTOMY 77X122 PED (DRAPES) ×2 IMPLANT
DRSG TELFA 4X3 1S NADH ST (GAUZE/BANDAGES/DRESSINGS) ×2 IMPLANT
ELECT CAUTERY BLADE 6.4 (BLADE) ×2 IMPLANT
ELECT REM PT RETURN 9FT ADLT (ELECTROSURGICAL) ×2
ELECTRODE REM PT RTRN 9FT ADLT (ELECTROSURGICAL) ×1 IMPLANT
GAUZE SPONGE 4X4 12PLY STRL (GAUZE/BANDAGES/DRESSINGS) ×2 IMPLANT
GLOVE SURG ENC MOIS LTX SZ7 (GLOVE) ×2 IMPLANT
GLOVE SURG SYN 8.0 (GLOVE) ×2 IMPLANT
GLOVE SURG UNDER LTX SZ7.5 (GLOVE) ×2 IMPLANT
GOWN STRL REUS W/ TWL LRG LVL3 (GOWN DISPOSABLE) ×1 IMPLANT
GOWN STRL REUS W/ TWL XL LVL3 (GOWN DISPOSABLE) ×2 IMPLANT
GOWN STRL REUS W/TWL LRG LVL3 (GOWN DISPOSABLE) ×1
GOWN STRL REUS W/TWL XL LVL3 (GOWN DISPOSABLE) ×2
LABEL OR SOLS (LABEL) ×2 IMPLANT
MANIFOLD NEPTUNE II (INSTRUMENTS) ×2 IMPLANT
NEEDLE HYPO 25X1 1.5 SAFETY (NEEDLE) ×4 IMPLANT
NS IRRIG 500ML POUR BTL (IV SOLUTION) ×2 IMPLANT
PACK BASIN MINOR ARMC (MISCELLANEOUS) ×2 IMPLANT
SOL PREP PVP 2OZ (MISCELLANEOUS) ×2
SOLUTION PREP PVP 2OZ (MISCELLANEOUS) ×1 IMPLANT
SUCTION FRAZIER HANDLE 10FR (MISCELLANEOUS) ×1
SUCTION TUBE FRAZIER 10FR DISP (MISCELLANEOUS) ×1 IMPLANT
SUT MNCRL AB 4-0 PS2 18 (SUTURE) ×2 IMPLANT
SUT SILK 3 0 (SUTURE) ×1
SUT SILK 3-0 18XBRD TIE 12 (SUTURE) ×1 IMPLANT
SUT SILK 4 0 (SUTURE) ×1
SUT SILK 4-0 18XBRD TIE 12 (SUTURE) ×1 IMPLANT
SUT VIC AB 3-0 SH 27 (SUTURE) ×1
SUT VIC AB 3-0 SH 27X BRD (SUTURE) ×1 IMPLANT
SYR 10ML LL (SYRINGE) ×4 IMPLANT
SYR BULB IRRIG 60ML STRL (SYRINGE) ×2 IMPLANT

## 2020-07-04 NOTE — Op Note (Signed)
        OPERATIVE NOTE   PRE-OPERATIVE DIAGNOSIS: suspected temporal arteritis, left eye visual loss  POST-OPERATIVE DIAGNOSIS: Same as above  PROCEDURE: 1.   Left temporal artery biopsy  SURGEON: Hortencia Pilar, MD  ASSISTANT(S): None  ANESTHESIA: MAC  ESTIMATED BLOOD LOSS: Minimal  FINDING(S): 1.  none  SPECIMEN(S):  Left superficial temporal artery sent to pathology  INDICATIONS:   Patient is a 75 y.o. male who presents with left eye visual loss and elevated inflammatory markers.  We were consulted by Dr. Wallace Going for consideration for temporal artery biopsy. Risks and benefits were discussed and he was agreeable to proceed.  DESCRIPTION: After obtaining full informed written consent, the patient was brought back to the operating room and placed supine upon the operating table.  The patient received IV antibiotics prior to induction.  After obtaining adequate anesthesia, the patient was prepped and draped in the standard fashion. The area in front of his left ear was anesthetized copiously with a solution of 1% lidocaine and half percent Marcaine without epinephrine. I then made an incision just in front of the left ear overlying the palpable pulse. I then dissected down through the subcutaneous tissues and identified the superficial temporal artery. This was dissected out over a several centimeters and branches were ligated and divided between silk ties. Care was used to avoid electrocautery around the artery. I then clamped the artery proximally and distally and transected the artery. The specimen was then sent to pathology. The proximal and distal artery were ligated with 3-0 silk ties. Hemostasis was achieved. The wound was then closed with a series of interrupted 3-0 Vicryl's and the skin was closed with a 4-0 Monocryl. Sterile dressing was placed. The patient was taken to the recovery room in stable condition having tolerated the procedure well.  COMPLICATIONS:  None  CONDITION: Stable   Hortencia Pilar 07/04/2020 3:19 PM  This note was created with Dragon Medical transcription system. Any errors in dictation are purely unintentional.

## 2020-07-04 NOTE — Progress Notes (Signed)
The patient has tested positive for COVID-19.  The patient is asymptomatic.  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  Given the situation further visual loss and the possibility of total blindness is a real issue and therefore in spite of his asymptomatic positive COVID test I believe that biopsy is appropriate and warranted.  I have recommended we proceed urgently with temporal artery biopsy as planned.

## 2020-07-04 NOTE — Transfer of Care (Signed)
Immediate Anesthesia Transfer of Care Note  Patient: Luis Miller.  Procedure(s) Performed: BIOPSY TEMPORAL ARTERY (Left )  Patient Location: PACU  Anesthesia Type:General  Level of Consciousness: awake, alert  and oriented  Airway & Oxygen Therapy: Patient Spontanous Breathing  Post-op Assessment: Report given to RN  Post vital signs: Reviewed and stable  Last Vitals:  Vitals Value Taken Time  BP    Temp    Pulse    Resp    SpO2      Last Pain:  Vitals:   07/04/20 1039  TempSrc: Temporal  PainSc: 0-No pain         Complications: No complications documented.

## 2020-07-04 NOTE — Pre-Procedure Instructions (Signed)
Patient called [facility] asking which medications to take the morning of surgery.  This RN instructed patient to take amlodipine, hydralazine, fluoxetine, and allopurinol.  Patient verbalized understanding.

## 2020-07-04 NOTE — H&P (View-Only) (Signed)
The patient has tested positive for COVID-19.  The patient is asymptomatic.  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  Given the situation further visual loss and the possibility of total blindness is a real issue and therefore in spite of his asymptomatic positive COVID test I believe that biopsy is appropriate and warranted.  I have recommended we proceed urgently with temporal artery biopsy as planned.

## 2020-07-04 NOTE — Interval H&P Note (Signed)
History and Physical Interval Note:  07/04/2020 1:41 PM  Luis Miller.  has presented today for surgery, with the diagnosis of TEMPORAL ARTERITIS.  The various methods of treatment have been discussed with the patient and family. After consideration of risks, benefits and other options for treatment, the patient has consented to  Procedure(s): BIOPSY TEMPORAL ARTERY (Left) as a surgical intervention.  The patient's history has been reviewed, patient examined, no change in status, stable for surgery.  I have reviewed the patient's chart and labs.  Questions were answered to the patient's satisfaction.     Hortencia Pilar

## 2020-07-04 NOTE — Anesthesia Preprocedure Evaluation (Signed)
Anesthesia Evaluation  Patient identified by MRN, date of birth, ID band Patient awake    Reviewed: Allergy & Precautions, NPO status , Patient's Chart, lab work & pertinent test results  History of Anesthesia Complications Negative for: history of anesthetic complications  Airway Mallampati: III       Dental   Pulmonary sleep apnea , neg COPD, Not current smoker, former smoker,           Cardiovascular hypertension, Pt. on medications (-) Past MI and (-) CHF (-) dysrhythmias (-) Valvular Problems/Murmurs     Neuro/Psych neg Seizures Depression Bipolar Disorder    GI/Hepatic neg GERD  ,  Endo/Other  neg diabetes  Renal/GU Renal InsufficiencyRenal disease     Musculoskeletal   Abdominal   Peds  Hematology  (+) anemia ,   Anesthesia Other Findings   Reproductive/Obstetrics                             Anesthesia Physical Anesthesia Plan  ASA: III  Anesthesia Plan: General   Post-op Pain Management:    Induction: Intravenous  PONV Risk Score and Plan: Propofol infusion and TIVA  Airway Management Planned: Nasal Cannula  Additional Equipment:   Intra-op Plan:   Post-operative Plan:   Informed Consent: I have reviewed the patients History and Physical, chart, labs and discussed the procedure including the risks, benefits and alternatives for the proposed anesthesia with the patient or authorized representative who has indicated his/her understanding and acceptance.       Plan Discussed with:   Anesthesia Plan Comments:         Anesthesia Quick Evaluation

## 2020-07-04 NOTE — Progress Notes (Signed)
Recovering in OR  due to Positive Covid Test

## 2020-07-07 ENCOUNTER — Telehealth: Payer: Self-pay | Admitting: *Deleted

## 2020-07-07 ENCOUNTER — Encounter: Payer: Self-pay | Admitting: Vascular Surgery

## 2020-07-07 DIAGNOSIS — U071 COVID-19: Secondary | ICD-10-CM

## 2020-07-07 NOTE — Telephone Encounter (Signed)
Patient is Covid positive from pre-op test done on 07/04/20. Only symptom is decreased taste/smell. Has had vaccines and 1st booster. Would like a referral to talk with Covid Infusion regarding possible medication. Okay to call home # and leave a message if no answer.

## 2020-07-08 ENCOUNTER — Encounter: Payer: Self-pay | Admitting: Urology

## 2020-07-08 LAB — SURGICAL PATHOLOGY

## 2020-07-08 NOTE — Addendum Note (Signed)
Addended by: Jon Billings on: 07/08/2020 08:39 AM   Modules accepted: Orders

## 2020-07-08 NOTE — Telephone Encounter (Signed)
Patient notified

## 2020-07-08 NOTE — Telephone Encounter (Signed)
Referral placed for COVID antibody infusion.  The infusion center will decide if patient meets criteria for infusion.  They will be in touch within 48 hours.  If you don't hear from them it means he does not qualify.

## 2020-07-09 DIAGNOSIS — S0101XA Laceration without foreign body of scalp, initial encounter: Secondary | ICD-10-CM | POA: Diagnosis not present

## 2020-07-10 NOTE — Anesthesia Postprocedure Evaluation (Signed)
Anesthesia Post Note  Patient: Luis Miller.  Procedure(s) Performed: BIOPSY TEMPORAL ARTERY (Left )  Patient location during evaluation: PACU Anesthesia Type: General Level of consciousness: awake and alert Pain management: pain level controlled Vital Signs Assessment: post-procedure vital signs reviewed and stable Respiratory status: spontaneous breathing, nonlabored ventilation, respiratory function stable and patient connected to nasal cannula oxygen Cardiovascular status: blood pressure returned to baseline and stable Postop Assessment: no apparent nausea or vomiting Anesthetic complications: no   No complications documented.   Last Vitals:  Vitals:   07/04/20 1039  BP: (!) 167/89  Pulse: 67  Resp: 18  Temp: 37 C  SpO2: 99%    Last Pain:  Vitals:   07/04/20 1039  TempSrc: Temporal  PainSc: 0-No pain                 Molli Barrows

## 2020-07-18 ENCOUNTER — Encounter (INDEPENDENT_AMBULATORY_CARE_PROVIDER_SITE_OTHER): Payer: Self-pay | Admitting: Nurse Practitioner

## 2020-07-18 ENCOUNTER — Ambulatory Visit (INDEPENDENT_AMBULATORY_CARE_PROVIDER_SITE_OTHER): Payer: Medicare Other | Admitting: Nurse Practitioner

## 2020-07-18 ENCOUNTER — Other Ambulatory Visit: Payer: Self-pay

## 2020-07-18 VITALS — BP 131/71 | HR 67 | Resp 16 | Wt 193.2 lb

## 2020-07-18 DIAGNOSIS — E782 Mixed hyperlipidemia: Secondary | ICD-10-CM

## 2020-07-18 DIAGNOSIS — H539 Unspecified visual disturbance: Secondary | ICD-10-CM

## 2020-07-18 DIAGNOSIS — I10 Essential (primary) hypertension: Secondary | ICD-10-CM

## 2020-07-18 NOTE — Progress Notes (Signed)
Subjective:    Patient ID: Luis Coss., male    DOB: 11/23/1945, 75 y.o.   MRN: [id number] Chief Complaint  Patient presents with  . Routine Post Op    [facility] 2wk post temporal artery biopsy     godfrey tritschler is a 75 year old male that presents today following left temporal artery biopsy.  The patient had sudden loss of vision in his left eye several weeks ago.  The patient was sent for evaluation for temporal arteritis by his ophthalmologist Dr. Wallace Going.  The patient did have elevated inflammatory markers.  The patient's biopsy result today reveals no evidence of temporal arteritis.  The patient previously had a carotid artery duplex which also revealed less than 30% stenosis bilaterally.  He continues to note visual disturbances.  He denies any palpitations.  He denies any known history of atrial fibrillation.  He denies any other strokelike symptoms such as sudden confusion, weakness or slurred speech.  In regards to the recent biopsy the patient did well.  Currently the incision is clean dry and intact.  There is no pain or drainage from the incision.  No evidence of dehiscence.   Review of Systems  Eyes: Positive for visual disturbance.  Cardiovascular: Negative for palpitations.  All other systems reviewed and are negative.      Objective:   Physical Exam Vitals reviewed.  HENT:     Head: Normocephalic.  Cardiovascular:     Rate and Rhythm: Normal rate.     Pulses: Normal pulses.  Pulmonary:     Effort: Pulmonary effort is normal.  Skin:    General: Skin is warm and dry.  Neurological:     Mental Status: He is alert and oriented to person, place, and time.  Psychiatric:        Mood and Affect: Mood normal.        Behavior: Behavior normal.        Thought Content: Thought content normal.        Judgment: Judgment normal.     BP 131/71 (BP Location: Right Arm)   Pulse 67   Resp 16   Wt 193 lb 3.2 oz (87.6 kg)   BMI 29.38 kg/m   Past Medical History:   Diagnosis Date  . Acute renal failure (ARF) ([place]) 08/02/2019  . Anemia   . Apnea, sleep    CPAP  . Arthritis   . Bipolar affective ([place])   . Cancer ([place]) malignant melanoma  . COVID-19 12/17/2019  . CRI (chronic renal insufficiency)   . Crohn's disease ([place])   . Depression   . Diabetes insipidus ([place])   . Erectile dysfunction   . Gout   . Hyperlipidemia   . Hypertension   . IBS (irritable bowel syndrome)   . Impotence   . Internal hemorrhoids   . 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 Paraphimosis   . Peyronie disease   . Secondary hyperparathyroidism of renal origin ([place])   . Skin cancer   . Testicular hypofunction   . Urinary frequency   . Urinary hesitancy   . Wears hearing aid in both ears     Social History   Socioeconomic History  . Marital status: Divorced    Spouse name: Not on file  . Number of children: 2  . Years of education: Not on file  . Highest education level: High school graduate  Occupational History  . Occupation: retired  Tobacco Use  . Smoking status: Former Smoker  Types: Cigarettes    Quit date: 09/08/1972    Years since quitting: 47.8  . Smokeless tobacco: Never Used  Vaping Use  . Vaping Use: Never used  Substance and Sexual Activity  . Alcohol use: Not Currently    Alcohol/week: 0.0 standard drinks    Comment: Occasional glass of wine  . Drug use: No  . Sexual activity: Yes    Partners: Female    Birth control/protection: None  Other Topics Concern  . Not on file  Social History Narrative  . Not on file   Social Determinants of Health   Financial Resource Strain: Low Risk   . Difficulty of Paying Living Expenses: Not hard at all  Food Insecurity: No Food Insecurity  . Worried About Charity fundraiser in the Last Year: Never true  . Ran Out of Food in the Last Year: Never true  Transportation Needs: No Transportation Needs  . Lack of Transportation (Medical): No  . Lack of Transportation (Non-Medical): No  Physical  Activity: Sufficiently Active  . Days of Exercise per Week: 7 days  . Minutes of Exercise per Session: 30 min  Stress: No Stress Concern Present  . Feeling of Stress : Not at all  Social Connections: Not on file  Intimate Partner Violence: Not on file    Past Surgical History:  Procedure Laterality Date  . arm fracture    . ARTERY BIOPSY Left 07/04/2020   Procedure: BIOPSY TEMPORAL ARTERY;  Surgeon: Katha Cabal, MD;  Location: [facility];  Service: Vascular;  Laterality: Left;  . BONE MARROW BIOPSY    . CATARACT EXTRACTION W/PHACO Right 04/09/2020   Procedure: CATARACT EXTRACTION PHACO AND INTRAOCULAR LENS PLACEMENT (IOC) RIGHT EYHANCE TORIC 6.84 01:03.7 10.7%;  Surgeon: Leandrew Koyanagi, MD;  Location: [facility];  Service: Ophthalmology;  Laterality: Right;  sleep apnea  . CATARACT EXTRACTION W/PHACO Left 05/07/2020   Procedure: CATARACT EXTRACTION PHACO AND INTRAOCULAR LENS PLACEMENT (IOC) LEFT EYHANCE TORIC 2.93 00:58.7 5.0%;  Surgeon: Leandrew Koyanagi, MD;  Location: [facility];  Service: Ophthalmology;  Laterality: Left;  . COLONOSCOPY WITH PROPOFOL N/A 12/23/2014   Procedure: COLONOSCOPY WITH PROPOFOL;  Surgeon: Manya Silvas, MD;  Location[facility];  Service: Endoscopy;  Laterality: N/A;  . COLONOSCOPY WITH PROPOFOL N/A 02/06/2018   Procedure: COLONOSCOPY WITH PROPOFOL;  Surgeon: Manya Silvas, MD;  Location[facility];  Service: Endoscopy;  Laterality: N/A;  . KNEE ARTHROPLASTY Left 02/22/2018   Procedure: COMPUTER ASSISTED TOTAL KNEE ARTHROPLASTY;  Surgeon: Dereck Leep, MD;  Location: [facility];  Service: Orthopedics;  Laterality: Left;  Marland Kitchen MELANOMA EXCISION    . PENILE PROSTHESIS IMPLANT    . PENILE PROSTHESIS IMPLANT N/A 08/25/2015   Procedure: REPLACEMENT OF INFLATABLE PENILE PROSTHESIS COMPONENTS;  Surgeon: Cleon Gustin, MD;  Location: [facility];  Service: Urology;  Laterality: N/A;  . SKIN CANCER EXCISION     nose  .  TONSILLECTOMY    . VASECTOMY      Family History  Problem Relation Age of Onset  . Depression Sister   . Bipolar disorder Other   . Prostate cancer Neg Hx   . Kidney cancer Neg Hx   . Bladder Cancer Neg Hx     Allergies  Allergen Reactions  . Mirtazapine Rash  . Aspirin     Other reaction(s): Unknown Patient was instructed not to take aspirin but can not remember why  . Atomoxetine Other (See Comments)    Urinary sx  .  Strattera [Atomoxetine Hcl] Other (See Comments)    Urinary sx  . Xyzal [Levocetirizine Dihydrochloride] Rash    Patient reported on 11/02/17    CBC Latest Ref Rng & Units 07/04/2020 06/30/2020 06/09/2020  WBC 4.0 - 10.5 K/uL 9.3 7.6 3.3(L)  Hemoglobin 13.0 - 17.0 g/dL 11.5(L) 9.7(L) 10.8(L)  Hematocrit 39.0 - 52.0 % 32.5(L) 27.9(L) 31.2(L)  Platelets 150 - 400 K/uL 151 131(L) 127(L)      CMP     Component Value Date/Time   NA 148 (H) 07/04/2020 1031   NA 146 (H) 04/26/2019 1101   K 3.6 07/04/2020 1031   K 4.1 02/11/2014 1443   CL 111 07/04/2020 1031   CO2 26 07/04/2020 1031   GLUCOSE 101 (H) 07/04/2020 1031   BUN 72 (H) 07/04/2020 1031   BUN 50 (H) 04/26/2019 1101   CREATININE 4.18 (H) 07/04/2020 1031   CREATININE 2.22 (H) 03/08/2014 1034   CALCIUM 9.3 07/04/2020 1031   CALCIUM 9.4 03/08/2014 1034   PROT 6.3 (L) 06/30/2020 1708   PROT 7.1 04/26/2019 1101   ALBUMIN 3.6 06/30/2020 1708   ALBUMIN 4.7 04/26/2019 1101   AST 27 06/30/2020 1708   AST 21 10/03/2017 1101   ALT 22 06/30/2020 1708   ALT 20 10/03/2017 1101   ALKPHOS 56 06/30/2020 1708   BILITOT 0.5 06/30/2020 1708   BILITOT 0.6 04/26/2019 1101   GFRNONAA 14 (L) 07/04/2020 1031   GFRNONAA 31 (L) 03/08/2014 1034   GFRNONAA 34 (L) 03/07/2013 0954   GFRAA 18 (L) 08/06/2019 0552   GFRAA 38 (L) 03/08/2014 1034   GFRAA 39 (L) 03/07/2013 0954     No results found.     Assessment & Plan:   1. Visual disturbance The patient was negative for temporal arteritis.  Recent noninvasive  study shows less than 30% stenosis in his bilateral internal carotid arteries.  He still continues to have his visual disturbance.  The patient has an upcoming visit with his ophthalmologist soon and he is advised to discuss with ophthalmologist for further work-up.  The patient is also not discussed this with his primary care physician.  I have also recommended that he discuss this with her as she may be able to proceed with further work-up.  While the patient has no diagnosed atrial fibrillation it is possible he could have paroxysmal atrial fibrillation causing small emboli.  He may be beneficial the patient have a cardiology referral.  Likewise, it may also be beneficial for neurology referral in the setting of the patient having had a possible stroke causing the symptoms.  The patient will continue to follow this in 1 year due to the carotid stenosis.  Patient's incision is clean dry and intact with no further intervention necessary.  2. Essential hypertension Continue antihypertensive medications as already ordered, these medications have been reviewed and there are no changes at this time.   3. Mixed hyperlipidemia Continue statin as ordered and reviewed, no changes at this time    Current Outpatient Medications on File Prior to Visit  Medication Sig Dispense Refill  . allopurinol (ZYLOPRIM) 100 MG tablet Take 100 mg by mouth daily.    Marland Kitchen amLODipine (NORVASC) 10 MG tablet Take 1 tablet (10 mg total) by mouth daily. 90 tablet 1  . benazepril (LOTENSIN) 40 MG tablet Take 40 mg by mouth daily.     Marland Kitchen FLUoxetine (PROZAC) 10 MG capsule Take 1 capsule (10 mg total) by mouth daily. 90 capsule 1  . hydrALAZINE (APRESOLINE) 100  MG tablet Take 100 mg by mouth 3 (three) times daily.    Marland Kitchen HYDROcodone-acetaminophen (NORCO) 5-325 MG tablet Take 1-2 tablets by mouth every 6 (six) hours as needed for moderate pain or severe pain. 10 tablet 0  . loperamide (IMODIUM) 2 MG capsule Take 2 mg by mouth daily as  needed for diarrhea or loose stools.    . Multiple Vitamin (MULTIVITAMIN) tablet Take 1 tablet by mouth daily.    Marland Kitchen OLANZapine (ZYPREXA) 5 MG tablet Take 1 tablet (5 mg total) by mouth at bedtime. 90 tablet 0  . predniSONE (DELTASONE) 20 MG tablet Take 60 mg by mouth daily.    . rosuvastatin (CRESTOR) 5 MG tablet Take 1 tablet (5 mg total) by mouth daily. 90 tablet 1  . traZODone (DESYREL) 100 MG tablet Take 1 tablet (100 mg total) by mouth at bedtime. 90 tablet 0  . Vitamin D, Cholecalciferol, 25 MCG (1000 UT) TABS Take 1,000 tablets by mouth daily.      No current facility-administered medications on file prior to visit.    There are no Patient Instructions on file for this visit. No follow-ups on file.   Kris Hartmann, NP

## 2020-07-19 DIAGNOSIS — Z4802 Encounter for removal of sutures: Secondary | ICD-10-CM | POA: Diagnosis not present

## 2020-07-29 DIAGNOSIS — H47012 Ischemic optic neuropathy, left eye: Secondary | ICD-10-CM | POA: Diagnosis not present

## 2020-07-31 DIAGNOSIS — I10 Essential (primary) hypertension: Secondary | ICD-10-CM | POA: Diagnosis not present

## 2020-07-31 DIAGNOSIS — D631 Anemia in chronic kidney disease: Secondary | ICD-10-CM | POA: Diagnosis not present

## 2020-07-31 DIAGNOSIS — N184 Chronic kidney disease, stage 4 (severe): Secondary | ICD-10-CM | POA: Diagnosis not present

## 2020-07-31 DIAGNOSIS — R809 Proteinuria, unspecified: Secondary | ICD-10-CM | POA: Diagnosis not present

## 2020-07-31 DIAGNOSIS — N2581 Secondary hyperparathyroidism of renal origin: Secondary | ICD-10-CM | POA: Diagnosis not present

## 2020-08-06 ENCOUNTER — Telehealth: Payer: Self-pay

## 2020-08-06 NOTE — Telephone Encounter (Signed)
Please advise 

## 2020-08-06 NOTE — Telephone Encounter (Signed)
Routing to provider  

## 2020-08-06 NOTE — Telephone Encounter (Signed)
Cholesterol is usually checked every 6 months.  However, if he would like to have it checked sooner than August I can place an order for him to come in for a lab visit.

## 2020-08-06 NOTE — Telephone Encounter (Signed)
Patient notified

## 2020-08-06 NOTE — Telephone Encounter (Signed)
Copied from [place] [phone]. Topic: General - Call Back - No Documentation >> Aug 06, 2020  1:36 PM Erick Blinks wrote: Pt wants to know if PCP wants to check cholesterol soon, please advise  Best contact: [phone]  Please advise does pt need lab next apt on 11/17/2020

## 2020-08-25 ENCOUNTER — Other Ambulatory Visit: Payer: Self-pay | Admitting: *Deleted

## 2020-08-25 DIAGNOSIS — R972 Elevated prostate specific antigen [PSA]: Secondary | ICD-10-CM

## 2020-08-28 DIAGNOSIS — I10 Essential (primary) hypertension: Secondary | ICD-10-CM | POA: Diagnosis not present

## 2020-08-28 DIAGNOSIS — D631 Anemia in chronic kidney disease: Secondary | ICD-10-CM | POA: Diagnosis not present

## 2020-08-28 DIAGNOSIS — R809 Proteinuria, unspecified: Secondary | ICD-10-CM | POA: Diagnosis not present

## 2020-08-28 DIAGNOSIS — N2581 Secondary hyperparathyroidism of renal origin: Secondary | ICD-10-CM | POA: Diagnosis not present

## 2020-08-28 DIAGNOSIS — N184 Chronic kidney disease, stage 4 (severe): Secondary | ICD-10-CM | POA: Diagnosis not present

## 2020-09-11 DIAGNOSIS — H0100A Unspecified blepharitis right eye, upper and lower eyelids: Secondary | ICD-10-CM | POA: Diagnosis not present

## 2020-09-12 DIAGNOSIS — L298 Other pruritus: Secondary | ICD-10-CM | POA: Diagnosis not present

## 2020-09-12 DIAGNOSIS — L821 Other seborrheic keratosis: Secondary | ICD-10-CM | POA: Diagnosis not present

## 2020-09-12 DIAGNOSIS — Z85820 Personal history of malignant melanoma of skin: Secondary | ICD-10-CM | POA: Diagnosis not present

## 2020-09-12 DIAGNOSIS — Z85828 Personal history of other malignant neoplasm of skin: Secondary | ICD-10-CM | POA: Diagnosis not present

## 2020-09-12 DIAGNOSIS — L820 Inflamed seborrheic keratosis: Secondary | ICD-10-CM | POA: Diagnosis not present

## 2020-09-12 DIAGNOSIS — D225 Melanocytic nevi of trunk: Secondary | ICD-10-CM | POA: Diagnosis not present

## 2020-09-12 DIAGNOSIS — D2261 Melanocytic nevi of right upper limb, including shoulder: Secondary | ICD-10-CM | POA: Diagnosis not present

## 2020-09-12 DIAGNOSIS — Z08 Encounter for follow-up examination after completed treatment for malignant neoplasm: Secondary | ICD-10-CM | POA: Diagnosis not present

## 2020-09-15 ENCOUNTER — Other Ambulatory Visit: Payer: Self-pay | Admitting: Psychiatry

## 2020-09-15 ENCOUNTER — Other Ambulatory Visit: Payer: Self-pay | Admitting: Nurse Practitioner

## 2020-09-15 DIAGNOSIS — F3176 Bipolar disorder, in full remission, most recent episode depressed: Secondary | ICD-10-CM

## 2020-09-15 DIAGNOSIS — I10 Essential (primary) hypertension: Secondary | ICD-10-CM

## 2020-09-15 IMAGING — DX DG KNEE 1-2V PORT*L*
2 series · 2 of 2 positions shown · non-contrast
Comparison: None.

CLINICAL DATA: Total knee replacement status.

EXAM:
PORTABLE LEFT KNEE - 1-2 VIEW

[knee ap]
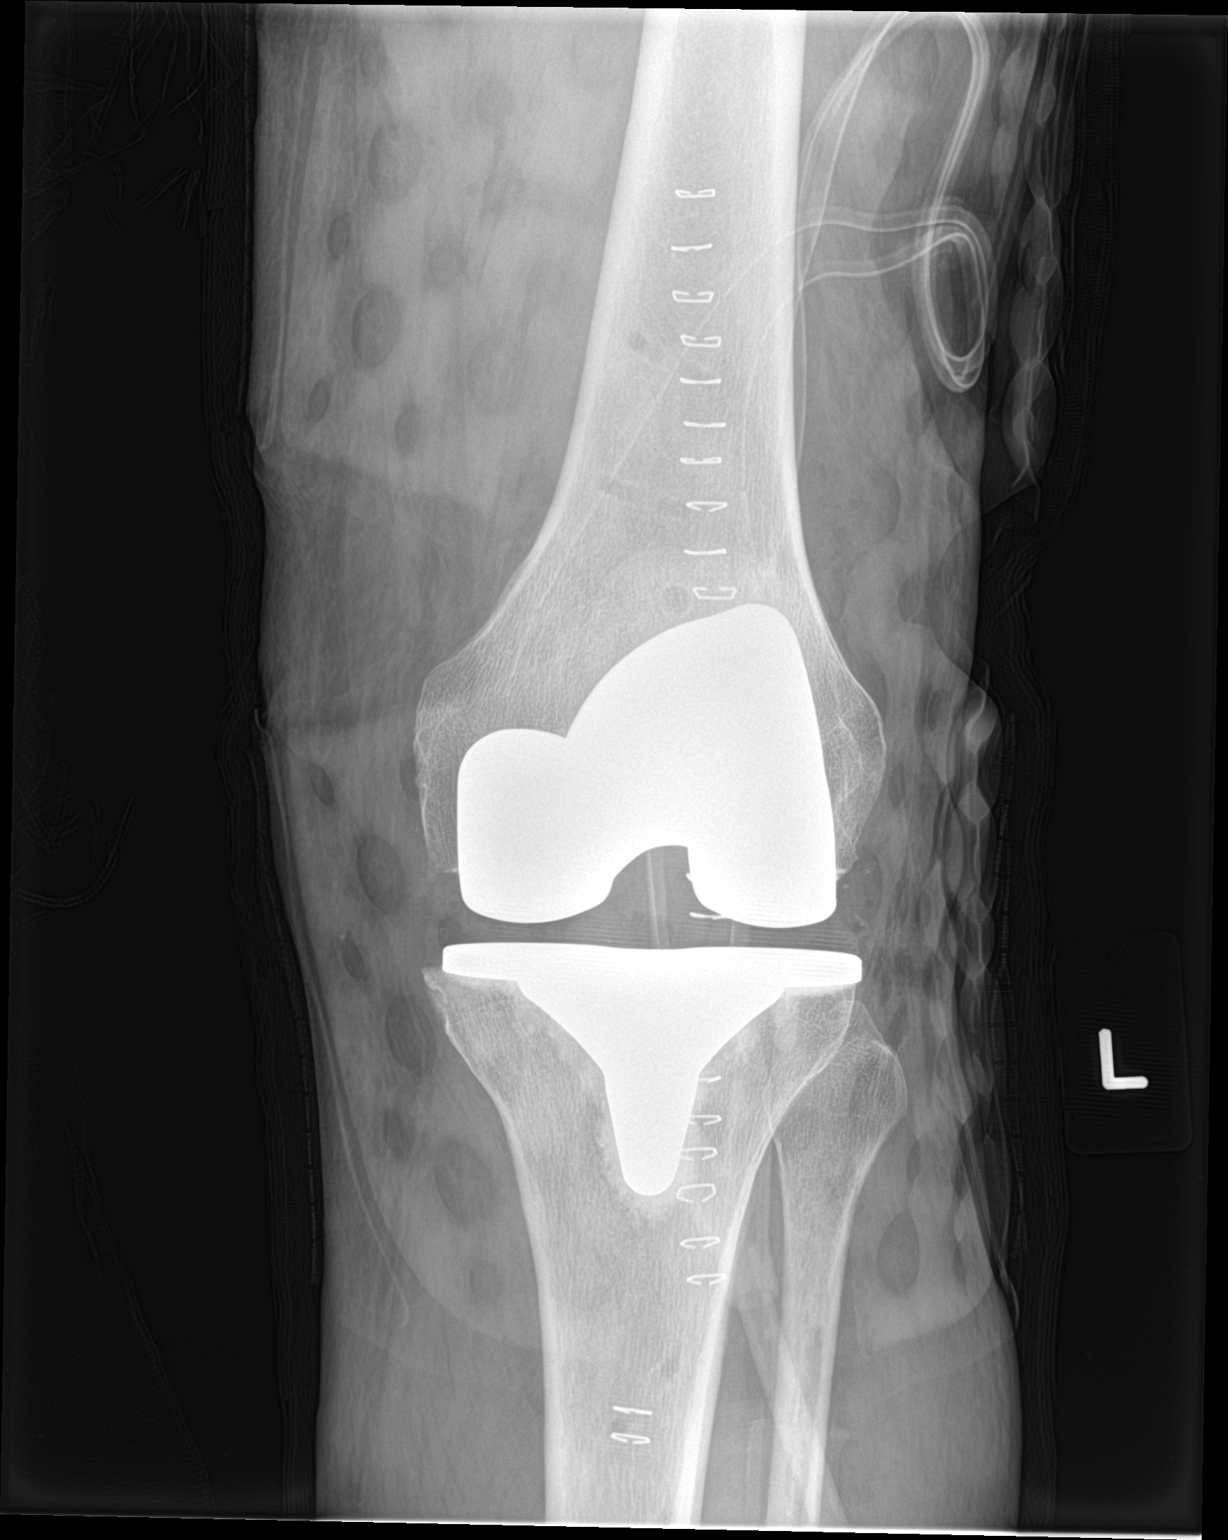

[knee lat]
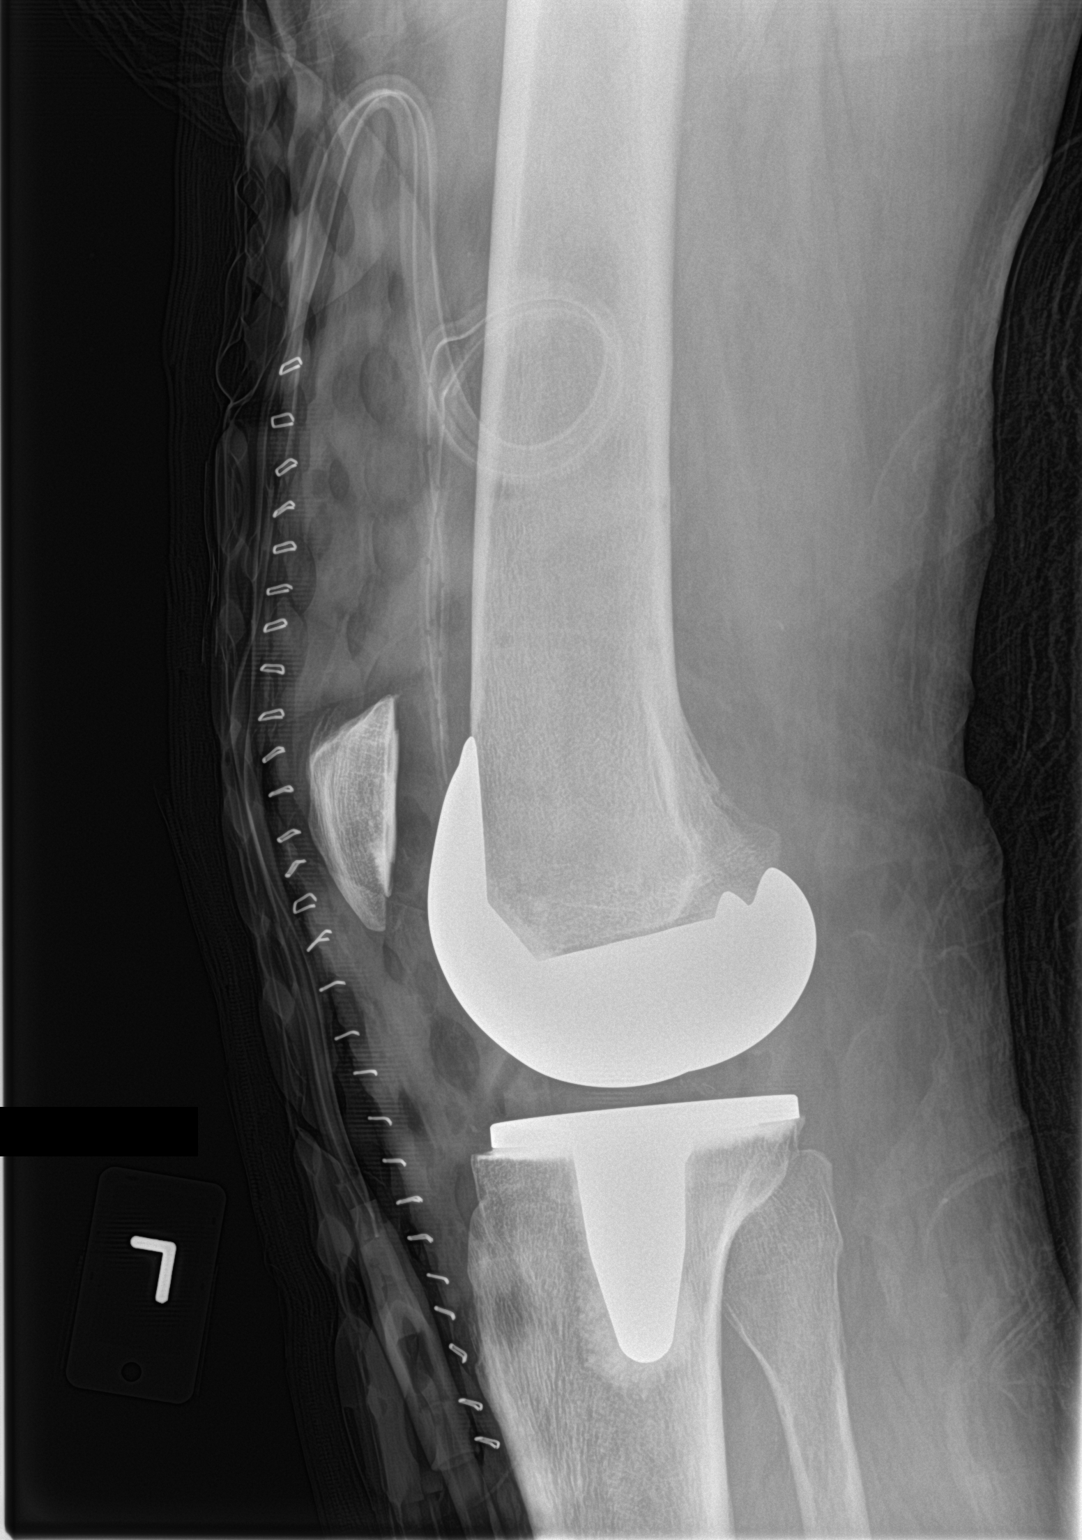

[2 of 2 positions shown; findings below may reference images not displayed]

FINDINGS: Surgical changes of LEFT knee arthroplasty. Hardware appears
appropriately positioned. Osseous alignment is anatomic. Expected
postsurgical changes within the surrounding soft tissues. Drainage
catheter at the level of the suprapatellar joint space.
IMPRESSION: Surgical changes of LEFT knee arthroplasty. Hardware appears
appropriately positioned.

## 2020-09-30 ENCOUNTER — Encounter: Payer: Self-pay | Admitting: Psychiatry

## 2020-09-30 ENCOUNTER — Telehealth: Payer: Self-pay

## 2020-09-30 ENCOUNTER — Ambulatory Visit (INDEPENDENT_AMBULATORY_CARE_PROVIDER_SITE_OTHER): Payer: Medicare Other | Admitting: Psychiatry

## 2020-09-30 ENCOUNTER — Other Ambulatory Visit: Payer: Self-pay

## 2020-09-30 VITALS — BP 135/65 | HR 76 | Temp 97.7°F | Wt 196.4 lb

## 2020-09-30 DIAGNOSIS — G4701 Insomnia due to medical condition: Secondary | ICD-10-CM | POA: Diagnosis not present

## 2020-09-30 DIAGNOSIS — R7989 Other specified abnormal findings of blood chemistry: Secondary | ICD-10-CM | POA: Diagnosis not present

## 2020-09-30 DIAGNOSIS — F3176 Bipolar disorder, in full remission, most recent episode depressed: Secondary | ICD-10-CM

## 2020-09-30 MED ORDER — OLANZAPINE 5 MG PO TABS
2.5000 mg | ORAL_TABLET | Freq: Every day | ORAL | 0 refills | Status: DC
Start: 2020-09-30 — End: 2020-10-20

## 2020-09-30 MED ORDER — FLUOXETINE HCL 10 MG PO CAPS: 10.0000 mg | ORAL_CAPSULE | Freq: Every day | ORAL | 1 refills | Status: DC

## 2020-09-30 MED ORDER — TRAZODONE HCL 100 MG PO TABS: 100.0000 mg | ORAL_TABLET | Freq: Every day | ORAL | 1 refills | Status: DC

## 2020-09-30 NOTE — Telephone Encounter (Signed)
Orders placed. Please see if we can make him a lab appointment.

## 2020-09-30 NOTE — Patient Instructions (Signed)
PLEASE FOLLOW UP WITH PRIMARY CARE FOR REPEAT TSH.  PLEASE START TAKING OLANZAPINE 2.5 MG AT BEDTIME.  PLEASE FOLLOW UP WITH PRIMARY CARE FOR YOUR ELEVATED BLOOD PRESSURE READING.

## 2020-09-30 NOTE — Telephone Encounter (Signed)
Copied from [place] [id number]. Topic: Appointment Scheduling - Scheduling Inquiry for Clinic >> Sep 30, 2020 10:52 AM Tessa Lerner A wrote: Reason for CRM: Patient has called to share that Dr. Shea Evans has requested for the patient to have their TSH checked  The patient would like lab orders as well as to be contacted for scheduling an appointment time to come in   Please contact further

## 2020-09-30 NOTE — Progress Notes (Signed)
[name]  09/30/2020 11:27 PM [name]  MRN:  [id number]  Chief Complaint:  Chief Complaint   Follow-up; Depression; Anxiety    HPI: Luis Milleris a 75 year old Caucasian male, divorced, retired, lives in [place], has a history of bipolar disorder currently in remission, chronic kidney disease stage IV, multiple other medical problems was evaluated in office today.  Patient today reports he is currently doing well.  Patient denies any significant mood symptoms.  Patient reports sleep as restless.  He wakes up several times to urinate.  He reports he has been advised by his primary care provider to increase his fluid intake which makes it challenging.  He does use CPAP however reports he continues to have restless sleep.  Patient reports short-term memory problems however overall he has been managing okay.  He does have a history of TSH abnormality however has not been able to repeat his TSH since his last visit when it was discussed to be repeated.  Patient agrees to get in touch with his primary care provider.  Patient denies any suicidality, homicidality or perceptual disturbances.  Patient denies any other concerns today.  Visit Diagnosis:    ICD-10-CM   1. Bipolar disorder, in full remission, most recent episode depressed ([place])  F31.76 OLANZapine (ZYPREXA) 5 MG tablet    traZODone (DESYREL) 100 MG tablet    FLUoxetine (PROZAC) 10 MG capsule    2. Insomnia due to medical condition  G47.01     3. Abnormal TSH  R79.89       Past Psychiatric History: Reviewed past psychiatric history from progress note on 04/24/2020.  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[facility])    Erectile dysfunction    Gout    Hyperlipidemia    Hypertension    IBS (irritable bowel syndrome)    Impotence    Internal hemorrhoids    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[place])    Paraphimosis    Peyronie disease    Secondary hyperparathyroidism of renal origin ([place])    Skin cancer    Testicular hypofunction    Urinary frequency    Urinary hesitancy    Wears hearing aid in both ears     Past Surgical History:  Procedure Laterality Date   arm fracture     ARTERY BIOPSY Left 07/04/2020   Procedure: BIOPSY TEMPORAL ARTERY;  Surgeon: Katha Cabal, MD;  Location: [facility];  Service: Vascular;  Laterality: Left;   BONE MARROW BIOPSY     CATARACT EXTRACTION W/PHACO Right 04/09/2020   Procedure: CATARACT EXTRACTION PHACO AND INTRAOCULAR LENS PLACEMENT (IOC) RIGHT EYHANCE TORIC 6.84 01:03.7 10.7%;  Surgeon: Leandrew Koyanagi, MD;  Location: [place];  Service: Ophthalmology;  Laterality: Right;  sleep apnea   CATARACT EXTRACTION W/PHACO Left 05/07/2020   Procedure: CATARACT EXTRACTION PHACO AND INTRAOCULAR LENS PLACEMENT (IOC) LEFT EYHANCE TORIC 2.93 00:58.7 5.0%;  Surgeon: Leandrew Koyanagi, MD;  Location: [place];  Service: Ophthalmology;  Laterality: Left;   COLONOSCOPY WITH PROPOFOL N/A 12/23/2014  Procedure: COLONOSCOPY WITH PROPOFOL;  Surgeon: Manya Silvas, MD;  [facility];  Service: Endoscopy;  Laterality: N/A;   COLONOSCOPY WITH PROPOFOL N/A 02/06/2018   Procedure: COLONOSCOPY WITH PROPOFOL;  Surgeon: Manya Silvas, MD;  Location: [facility] ENDOSCOPY;  Service: Endoscopy;  Laterality: N/A;   KNEE ARTHROPLASTY Left 02/22/2018   Procedure: COMPUTER ASSISTED TOTAL KNEE ARTHROPLASTY;  Surgeon: Dereck Leep, MD;  Location: [facility];  Service: Orthopedics;  Laterality: Left;   MELANOMA EXCISION     PENILE PROSTHESIS  IMPLANT     PENILE PROSTHESIS IMPLANT N/A 08/25/2015   Procedure: REPLACEMENT OF INFLATABLE PENILE PROSTHESIS COMPONENTS;  Surgeon: Cleon Gustin, MD;  Location: [facility];  Service: Urology;  Laterality: N/A;   SKIN CANCER EXCISION     nose   TONSILLECTOMY     VASECTOMY      Family Psychiatric History: I have reviewed family psychiatric history from progress note on 04/24/2020  Family History:  Family History  Problem Relation Age of Onset   Depression Sister    Bipolar disorder Other    Prostate cancer Neg Hx    Kidney cancer Neg Hx    Bladder Cancer Neg Hx     Social History: Reviewed social history from progress note on 04/24/2020 Social History   Socioeconomic History   Marital status: Divorced    Spouse name: Not on file   Number of children: 2   Years of education: Not on file   Highest education level: High school graduate  Occupational History   Occupation: retired  Tobacco Use   Smoking status: Former    Pack years: 0.00    Types: Cigarettes    Quit date: 09/08/1972    Years since quitting: 48.0   Smokeless tobacco: Never  Vaping Use   Vaping Use: Never used  Substance and Sexual Activity   Alcohol use: Not Currently    Alcohol/week: 0.0 standard drinks    Comment: Occasional glass of wine   Drug use: No   Sexual activity: Yes    Partners: Female    Birth control/protection: None  Other Topics Concern   Not on file  Social History Narrative   Not on file   Social Determinants of Health   Financial Resource Strain: Low Risk    Difficulty of Paying Living Expenses: Not hard at all  Food Insecurity: No Food Insecurity   Worried About Charity fundraiser in the Last Year: Never true   [place] in the Last Year: Never true  Transportation Needs: No Transportation Needs   Lack of Transportation (Medical): No   Lack of Transportation (Non-Medical): No  Physical Activity: Sufficiently Active   Days of Exercise per Week: 7 days   Minutes of  Exercise per Session: 30 min  Stress: No Stress Concern Present   Feeling of Stress : Not at all  Social Connections: Not on file    Allergies:  Allergies  Allergen Reactions   Mirtazapine Rash   Aspirin     Other reaction(s): Unknown Patient was instructed not to take aspirin but can not remember why   Atomoxetine Other (See Comments)    Urinary sx   Strattera [Atomoxetine Hcl] Other (See Comments)    Urinary sx   Xyzal [Levocetirizine Dihydrochloride] Rash    Patient reported on 03/27/24    Metabolic Disorder Labs: Lab Results  Component Value Date   HGBA1C 5.2 05/19/2020   Lab Results  Component Value Date   PROLACTIN  15.4 (H) 05/19/2020   Lab Results  Component Value Date   CHOL 227 (H) 05/19/2020   TRIG 147 05/19/2020   HDL 45 05/19/2020   CHOLHDL 5.0 05/19/2020   VLDL 50 (H) 10/03/2017   LDLCALC 155 (H) 05/19/2020   LDLCALC 130 (H) 04/26/2019   Lab Results  Component Value Date   TSH 0.313 (L) 05/19/2020   TSH 1.226 08/01/2019    Therapeutic Level Labs: Lab Results  Component Value Date   LITHIUM 0.70 08/06/2019   LITHIUM 0.78 08/05/2019   No results found for: VALPROATE No components found for:  CBMZ  Current Medications: Current Outpatient Medications  Medication Sig Dispense Refill   allopurinol (ZYLOPRIM) 100 MG tablet Take 100 mg by mouth daily.     amLODipine (NORVASC) 10 MG tablet TAKE 1 TABLET DAILY 90 tablet 0   benazepril (LOTENSIN) 40 MG tablet Take 40 mg by mouth daily.      calcitRIOL (ROCALTROL) 0.25 MCG capsule Take 0.25 mcg by mouth daily.     hydrALAZINE (APRESOLINE) 100 MG tablet Take 100 mg by mouth 3 (three) times daily.     loperamide (IMODIUM) 2 MG capsule Take 2 mg by mouth daily as needed for diarrhea or loose stools.     Multiple Vitamin (MULTIVITAMIN) tablet Take 1 tablet by mouth daily.     rosuvastatin (CRESTOR) 5 MG tablet TAKE 1 TABLET DAILY 90 tablet 0   Vitamin D, Cholecalciferol, 25 MCG (1000 UT) TABS Take 1,000  tablets by mouth daily.      FLUoxetine (PROZAC) 10 MG capsule Take 1 capsule (10 mg total) by mouth daily. 90 capsule 1   OLANZapine (ZYPREXA) 5 MG tablet Take 0.5 tablets (2.5 mg total) by mouth at bedtime. Patient has supplies 90 tablet 0   traZODone (DESYREL) 100 MG tablet Take 1 tablet (100 mg total) by mouth at bedtime. 90 tablet 1   No current facility-administered medications for this visit.     Musculoskeletal: Strength & Muscle Tone: within normal limits Gait & Station: normal Patient leans: N/A  Psychiatric Specialty Exam: Review of Systems  Psychiatric/Behavioral:  Positive for sleep disturbance.   All other systems reviewed and are negative.  Blood pressure 135/65, pulse 76, temperature 97.7 F (36.5 C), temperature source Temporal, weight 196 lb 6.4 oz (89.1 kg).Body mass index is 29.86 kg/m.  General Appearance: Casual  Eye Contact:  Good  Speech:  Clear and Coherent  Volume:  Normal  Mood:  Euthymic  Affect:  Congruent  Thought Process:  Goal Directed and Descriptions of Associations: Intact  Orientation:  Full (Time, Place, and Person)  Thought Content: Logical   Suicidal Thoughts:  No  Homicidal Thoughts:  No  Memory:  Immediate;   Fair Recent;   Fair Remote;   Fair  Judgement:  Fair  Insight:  Fair  Psychomotor Activity:  Normal  Concentration:  Concentration: Fair and Attention Span: Fair  Recall:  [employer] of Knowledge: Fair  Language: Fair  Akathisia:  No  Handed:  Right  AIMS (if indicated): done  Assets:  Communication Skills Desire for Improvement Housing Social Support  ADL's:  Intact  Cognition: WNL  Sleep:   Restless   Screenings: Mini-Mental    Flowsheet Row Office Visit from 02/23/2017 in [facility]  Total Score (max 30 points ) 28      PHQ2-9    [place] Visit from 09/30/2020 in [place] Video Visit from 06/25/2020 in [facility] Clinical  Support from 11/30/2019 in [place] from 11/20/2018 in [place] Visit from 04/24/2018 in [place]  PHQ-2 Total Score 0 0 0 0 0  PHQ-9 Total Score -- -- -- -- 3      Flowsheet Row Admission (Discharged) from 07/04/2020 in [place] ED from 06/30/2020 in [place] Video Visit from 06/25/2020 in [place]  C-SSRS RISK CATEGORY No Risk No Risk No Risk        Assessment and Plan: Luis Milleris a 75 year old male with history of bipolar disorder, chronic kidney disease stage IV, multiple medical problems was evaluated in office today.  Patient is biologically predisposed given history of mental health problems in his family, history of medical problems.  Patient is currently stable with regards to his mood.  Patient with abnormal thyroid panel.  Discussed plan as noted below.  Plan Bipolar disorder in full remission Reduce olanzapine to 2.5 mg at bedtime.  Long-term plan is to taper it off given the risk of long-term use of antipsychotic medications. We will consider adding another mood stabilizer like Lamictal as needed. Trazodone 100 mg p.o. nightly Fluoxetine 10 mg p.o. daily  Insomnia-unstable Patient does have increased urination which does make his sleep restless.  He also has a history of sleep apnea, reports compliance with CPAP.  We will consider repeating sleep study.  He does have upcoming appointment with his primary provider and agrees to discuss the same. Patient to work on sleep hygiene.  Abnormal TSH-reviewed and discussed TSH level dated 05/19/2020-0.313-low.  Patient was advised to repeat this however presents today reporting he was unable to do so.  Patient provided education.  We will recommend repeat TSH, thyroid panel.  We will coordinate care with primary care provider.   Follow-up in  clinic in 2 to 3 months or sooner if needed.This note was generated in part or whole with voice recognition software. Voice recognition is usually quite accurate but there are transcription errors that can and very often do occur. I apologize for any typographical errors that were not detected and corrected.      Ursula Alert, MD 10/01/2020, 2:32 PM

## 2020-10-01 ENCOUNTER — Telehealth: Payer: Self-pay | Admitting: Nurse Practitioner

## 2020-10-01 ENCOUNTER — Other Ambulatory Visit: Payer: Medicare Other

## 2020-10-01 DIAGNOSIS — E0590 Thyrotoxicosis, unspecified without thyrotoxic crisis or storm: Secondary | ICD-10-CM

## 2020-10-01 DIAGNOSIS — R7989 Other specified abnormal findings of blood chemistry: Secondary | ICD-10-CM

## 2020-10-01 NOTE — Telephone Encounter (Signed)
Labs entered.

## 2020-10-01 NOTE — Telephone Encounter (Signed)
Labs ordered.

## 2020-10-01 NOTE — Telephone Encounter (Signed)
Pt verbalized understanding.

## 2020-10-02 ENCOUNTER — Other Ambulatory Visit: Payer: Self-pay

## 2020-10-02 ENCOUNTER — Other Ambulatory Visit: Payer: Medicare Other

## 2020-10-02 DIAGNOSIS — R972 Elevated prostate specific antigen [PSA]: Secondary | ICD-10-CM

## 2020-10-02 LAB — THYROID PANEL WITH TSH
Free Thyroxine Index: 1.9 (ref 1.2–4.9)
T3 Uptake Ratio: 25 % (ref 24–39)
T4, Total: 7.6 ug/dL (ref 4.5–12.0)
TSH: 0.416 u[IU]/mL — ABNORMAL LOW (ref 0.450–4.500)

## 2020-10-02 NOTE — Progress Notes (Signed)
Please let patient know that his thyroid labs show that his TSH is still low.  I recommend he see see an endocrinologist for further evaluation.

## 2020-10-02 NOTE — Progress Notes (Deleted)
03/30/2019 10:26 AM   Maximiano Coss Apr 11, 1945 [id number]  Referring provider: Volney American, PA-C [address] [place],   [address]  Urological history 1. Elevated PSA - negative biopsy in 2002 for PSA of 4.03 - PSA trend Component     Latest Ref Rng & Units 06/03/2015 11/11/2015 05/31/2016 12/06/2016  Prostate Specific Ag, Serum     0.0 - 4.0 ng/mL 5.5 (H) 6.4 (H) 6.4 (H) 5.6 (H)   Component     Latest Ref Rng & Units 04/05/2017 06/13/2017 12/20/2017 06/20/2018  Prostate Specific Ag, Serum     0.0 - 4.0 ng/mL 7.1 (H) 6.4 (H) 5.7 (H) 6.6 (H)   Component     Latest Ref Rng & Units 09/26/2018 03/27/2019 09/26/2019  Prostate Specific Ag, Serum     0.0 - 4.0 ng/mL 5.8 (H) 5.9 (H) 4.6 (H)   -today's PSA is pending  2. ED - IPP placement for a malfunction IPP on 08/25/2015  3. BPH with LU TS - I PSS: 6/4  4. High Risk Hematuria - Former smoker - work up with RUS , non-contrast CT (due to CKD), cysto and cytology noted bilateral simple renal cysts and prominent lateral lobe enlargement with hypervascularity/friability prostate with elevated bladder neck - No gross hematuria  No chief complaint on file.     HPI: Mr. Cederberg is a 75 year old male who presents today for a 6 month follow up.      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[place])    Paraphimosis    Peyronie disease    Secondary hyperparathyroidism of renal origin ([place])    Skin cancer     Testicular hypofunction    Urinary frequency    Urinary hesitancy    Wears hearing aid in both ears     Surgical History: Past Surgical History:  Procedure Laterality Date   arm fracture     ARTERY BIOPSY Left 07/04/2020   Procedure: BIOPSY TEMPORAL ARTERY;  Surgeon: Katha Cabal, MD;  Location: [facility] OR[facility];  Service: Vascular;  Laterality: Left;   BONE MARROW BIOPSY     CATARACT EXTRACTION W/PHACO Right 04/09/2020   Procedure: CATARACT EXTRACTION PHACO AND INTRAOCULAR LENS PLACEMENT (IOC) RIGHT EYHANCE TORIC 6.84 01:03.7 10.7%;  Surgeon: Leandrew Koyanagi, MD;  Location: [place];  Service: Ophthalmology;  Laterality: Right;  sleep apnea   CATARACT EXTRACTION W/PHACO Left 05/07/2020   Procedure: CATARACT EXTRACTION PHACO AND INTRAOCULAR LENS PLACEMENT (IOC) LEFT EYHANCE TORIC 2.93 00:58.7 5.0%;  Surgeon: Leandrew Koyanagi, MD;  Location: [place];  Service: Ophthalmology;  Laterality: Left;   COLONOSCOPY WITH PROPOFOL N/A 12/23/2014   Procedure: COLONOSCOPY WITH PROPOFOL;  Surgeon: Manya Silvas, MD;  Location: [facility] ENDOSCOPY;  Service: Endoscopy;  Laterality: N/A;   COLONOSCOPY WITH PROPOFOL N/A 02/06/2018   Procedure: COLONOSCOPY  WITH PROPOFOL;  Surgeon: Manya Silvas, MD;  Location[facility] ENDOSCOPY;  Service: Endoscopy;  Laterality: N/A;   KNEE ARTHROPLASTY Left 02/22/2018   Procedure: COMPUTER ASSISTED TOTAL KNEE ARTHROPLASTY;  Surgeon: Dereck Leep, MD;  Location: [facility] ORS;  Service: Orthopedics;  Laterality: Left;   MELANOMA EXCISION     PENILE PROSTHESIS IMPLANT     PENILE PROSTHESIS IMPLANT N/A 08/25/2015   Procedure: REPLACEMENT OF INFLATABLE PENILE PROSTHESIS COMPONENTS;  Surgeon: Cleon Gustin, MD;  Location: [facility];  Service: Urology;  Laterality: N/A;   SKIN CANCER EXCISION     nose   TONSILLECTOMY     VASECTOMY      Home Medications:  Allergies as of 10/03/2020       Reactions   Mirtazapine Rash   Aspirin    Other reaction(s):  Unknown Patient was instructed not to take aspirin but can not remember why   Atomoxetine Other (See Comments)   Urinary sx   Strattera [atomoxetine Hcl] Other (See Comments)   Urinary sx   Xyzal [levocetirizine Dihydrochloride] Rash   Patient reported on 11/02/17        Medication List        Accurate as of October 02, 2020 10:26 AM. If you have any questions, ask your nurse or doctor.          allopurinol 100 MG tablet Commonly known as: ZYLOPRIM Take 100 mg by mouth daily.   amLODipine 10 MG tablet Commonly known as: NORVASC TAKE 1 TABLET DAILY   benazepril 40 MG tablet Commonly known as: LOTENSIN Take 40 mg by mouth daily.   calcitRIOL 0.25 MCG capsule Commonly known as: ROCALTROL Take 0.25 mcg by mouth daily.   FLUoxetine 10 MG capsule Commonly known as: PROZAC Take 1 capsule (10 mg total) by mouth daily.   hydrALAZINE 100 MG tablet Commonly known as: APRESOLINE Take 100 mg by mouth 3 (three) times daily.   loperamide 2 MG capsule Commonly known as: IMODIUM Take 2 mg by mouth daily as needed for diarrhea or loose stools.   multivitamin tablet Take 1 tablet by mouth daily.   OLANZapine 5 MG tablet Commonly known as: ZYPREXA Take 0.5 tablets (2.5 mg total) by mouth at bedtime. Patient has supplies   rosuvastatin 5 MG tablet Commonly known as: CRESTOR TAKE 1 TABLET DAILY   traZODone 100 MG tablet Commonly known as: DESYREL Take 1 tablet (100 mg total) by mouth at bedtime.   Vitamin D (Cholecalciferol) 25 MCG (1000 UT) Tabs Take 1,000 tablets by mouth daily.        Allergies:  Allergies  Allergen Reactions   Mirtazapine Rash   Aspirin     Other reaction(s): Unknown Patient was instructed not to take aspirin but can not remember why   Atomoxetine Other (See Comments)    Urinary sx   Strattera [Atomoxetine Hcl] Other (See Comments)    Urinary sx   Xyzal [Levocetirizine Dihydrochloride] Rash    Patient reported on 11/02/17    Family  History: Family History  Problem Relation Age of Onset   Depression Sister    Bipolar disorder Other    Prostate cancer Neg Hx    Kidney cancer Neg Hx    Bladder Cancer Neg Hx     Social History:  reports that he quit smoking about 48 years ago. His smoking use included cigarettes. He has never used smokeless tobacco. He reports previous alcohol use. He reports that he does not use drugs.  ROS:  For pertinent review of systems please refer to history of present illness  Physical Exam: There were no vitals taken for this visit.  Constitutional:  Well nourished. Alert and oriented, No acute distress. HEENT: [place] AT, moist mucus membranes.  Trachea midline Cardiovascular: No clubbing, cyanosis, or edema. Respiratory: Normal respiratory effort, no increased work of breathing. GI: Abdomen is soft, non tender, non distended, no abdominal masses. Liver and spleen not palpable.  No hernias appreciated.  Stool sample for occult testing is not indicated.   GU: No CVA tenderness.  No bladder fullness or masses.  Patient with circumcised/uncircumcised phallus. ***Foreskin easily retracted***  Urethral meatus is patent.  No penile discharge. No penile lesions or rashes. Scrotum without lesions, cysts, rashes and/or edema.  Testicles are located scrotally bilaterally. No masses are appreciated in the testicles. Left and right epididymis are normal. Rectal: Patient with  normal sphincter tone. Anus and perineum without scarring or rashes. No rectal masses are appreciated. Prostate is approximately *** grams, *** nodules are appreciated. Seminal vesicles are normal. Skin: No rashes, bruises or suspicious lesions. Lymph: No inguinal adenopathy. Neurologic: Grossly intact, no focal deficits, moving all 4 extremities. Psychiatric: Normal mood and affect.   Laboratory Data:  Component     Latest Ref Rng & Units 10/01/2020  TSH     0.450 - 4.500 uIU/mL 0.416 (L)  Thyroxine (T4)     4.5 - 12.0 ug/dL 7.6   T3 Uptake Ratio     24 - 39 % 25  Free Thyroxine Index     1.2 - 4.9 1.9  I have reviewed the labs.  Pertinent Imaging ***  Assessment & Plan:    1. 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 BPH with obstruction/lower urinary tract symptoms - I PSS: 6/4 - He is not interested in any medical therapy at this time -Return in 6 months for IPSS and exam  4. Hematuria, unspecified type - no reports of gross hematuria  No follow-ups on file.  These notes generated with voice recognition software. I apologize for typographical errors.  [place], [place] [address] [phone]   Zara Council, PA-C

## 2020-10-03 ENCOUNTER — Encounter: Payer: Self-pay | Admitting: Urology

## 2020-10-03 ENCOUNTER — Ambulatory Visit: Payer: Medicare Other | Admitting: Urology

## 2020-10-03 LAB — PSA: Prostate Specific Ag, Serum: 5.8 ng/mL — ABNORMAL HIGH (ref 0.0–4.0)

## 2020-10-09 ENCOUNTER — Ambulatory Visit: Payer: Medicare Other | Admitting: Urology

## 2020-10-09 DIAGNOSIS — R809 Proteinuria, unspecified: Secondary | ICD-10-CM | POA: Diagnosis not present

## 2020-10-09 DIAGNOSIS — N185 Chronic kidney disease, stage 5: Secondary | ICD-10-CM | POA: Diagnosis not present

## 2020-10-09 DIAGNOSIS — I10 Essential (primary) hypertension: Secondary | ICD-10-CM | POA: Diagnosis not present

## 2020-10-09 DIAGNOSIS — D631 Anemia in chronic kidney disease: Secondary | ICD-10-CM | POA: Diagnosis not present

## 2020-10-09 DIAGNOSIS — N2581 Secondary hyperparathyroidism of renal origin: Secondary | ICD-10-CM | POA: Diagnosis not present

## 2020-10-12 NOTE — Progress Notes (Signed)
03/30/2019 3:02 PM   Luis Miller Aug 14, 1945 [id number]  Referring provider: Jon Billings, NP [address] [place],  [place] [address]  Urological history 1. Elevated PSA - negative biopsy in 2002 for PSA of 4.03 - PSA trend Component     Latest Ref Rng & Units 06/03/2015 11/11/2015 05/31/2016 12/06/2016  Prostate Specific Ag, Serum     0.0 - 4.0 ng/mL 5.5 (H) 6.4 (H) 6.4 (H) 5.6 (H)   Component     Latest Ref Rng & Units 04/05/2017 06/13/2017 12/20/2017 06/20/2018  Prostate Specific Ag, Serum     0.0 - 4.0 ng/mL 7.1 (H) 6.4 (H) 5.7 (H) 6.6 (H)   Component     Latest Ref Rng & Units 09/26/2018 03/27/2019 09/26/2019  Prostate Specific Ag, Serum     0.0 - 4.0 ng/mL 5.8 (H) 5.9 (H) 4.6 (H)   Component     Latest Ref Rng & Units 10/02/2020  Prostate Specific Ag, Serum     0.0 - 4.0 ng/mL 5.8 (H)    2. ED - IPP placement for a malfunction IPP on 08/25/2015  3. 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 High Risk Hematuria - Former smoker - work up in 2021 with RUS , non-contrast CT (due to CKD), cysto and cytology noted bilateral simple renal cysts and prominent lateral lobe enlargement with hypervascularity/friability prostate with elevated bladder neck - No gross hematuria -UA 03/2020 negative for micro heme  Chief Complaint  Patient presents with   Follow-up    [facility] follow-up     HPI: [name] a 75year old male who presents today for a 6 month follow up.    He will likely need dialysis in the future.  He has urinary frequency which he attributes to increase in fluids.    Patient denies any modifying or aggravating factors.  Patient denies any gross hematuria, dysuria or suprapubic/flank pain.  Patient denies any fevers, chills, nausea or vomiting.    He prothesis is still functioning, but he is not sexually active x 2 years.    IPSS     Row Name 10/13/20 1400         International Prostate Symptom Score   How often have you had the sensation of not emptying  your bladder? Not at All     How often have you had to urinate less than every two hours? Less than half the time     How often have you found you stopped and started again several times when you urinated? Not at All     How often have you found it difficult to postpone urination? Less than half the time     How often have you had a weak urinary stream? Not at All     How often have you had to strain to start urination? Not at All     How many times did you typically get up at night to urinate? 3 Times     Total IPSS Score 7           Quality of Life due to urinary symptoms     If you were to spend the rest of your life with your urinary condition just the way it is now how would you feel about that? 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[place])    Paraphimosis    Peyronie disease    Secondary hyperparathyroidism of renal origin ([place])    Skin cancer    Testicular hypofunction    Urinary frequency    Urinary hesitancy    Wears hearing aid in both ears     Surgical History: Past Surgical History:  Procedure Laterality Date   arm fracture     ARTERY BIOPSY Left 07/04/2020   Procedure: BIOPSY TEMPORAL ARTERY;  Surgeon: Luis Cabal, MD;  Location: [facility];  Service: Vascular;  Laterality: Left;   BONE MARROW BIOPSY     CATARACT EXTRACTION W/PHACO Right 04/09/2020   Procedure: CATARACT EXTRACTION PHACO AND INTRAOCULAR LENS PLACEMENT (IOC)  RIGHT EYHANCE TORIC 6.84 01:03.7 10.7%;  Surgeon: Luis Koyanagi, MD;  Location: [facility];  Service: Ophthalmology;  Laterality: Right;  sleep apnea   CATARACT EXTRACTION W/PHACO Left 05/07/2020   Procedure: CATARACT EXTRACTION PHACO AND INTRAOCULAR LENS PLACEMENT (IOC) LEFT EYHANCE TORIC 2.93 00:58.7 5.0%;  Surgeon: Luis Koyanagi, MD;  Location: [facility];  Service: Ophthalmology;  Laterality: Left;   COLONOSCOPY WITH PROPOFOL N/A 12/23/2014   Procedure: COLONOSCOPY WITH PROPOFOL;  Surgeon: Luis Silvas, MD;  Location: [facility];  Service: Endoscopy;  Laterality: N/A;   COLONOSCOPY WITH PROPOFOL N/A 02/06/2018   Procedure: COLONOSCOPY WITH PROPOFOL;  Surgeon: Luis Silvas, MD;  Location[facility];  Service: Endoscopy;  Laterality: N/A;   KNEE ARTHROPLASTY Left 02/22/2018   Procedure: COMPUTER ASSISTED TOTAL KNEE ARTHROPLASTY;  Surgeon: Luis Leep, MD;  Location: [facility];  Service: Orthopedics;  Laterality: Left;   MELANOMA EXCISION     PENILE PROSTHESIS IMPLANT     PENILE PROSTHESIS IMPLANT N/A 08/25/2015   Procedure: REPLACEMENT OF INFLATABLE PENILE PROSTHESIS COMPONENTS;  Surgeon: Luis Gustin, MD;  Location: [facility];  Service: Urology;  Laterality: N/A;   SKIN CANCER EXCISION     nose   TONSILLECTOMY     VASECTOMY      Home Medications:  Allergies as of 10/13/2020       Reactions   Mirtazapine Rash   Aspirin    Other reaction(s): Unknown Patient was instructed not to take aspirin but can not remember why   Atomoxetine Other (See Comments)   Urinary sx   Strattera [atomoxetine Hcl] Other (See Comments)   Urinary sx   Xyzal [levocetirizine Dihydrochloride] Rash   Patient reported on 11/02/17        Medication List        Accurate as of October 13, 2020  3:02 PM. If you have any questions, ask your nurse or doctor.          allopurinol 100 MG tablet Commonly known as: ZYLOPRIM Take 100 mg by mouth daily.    amLODipine 10 MG tablet Commonly known as: NORVASC TAKE 1 TABLET DAILY   benazepril 40 MG tablet Commonly known as: LOTENSIN  Take 40 mg by mouth daily.   calcitRIOL 0.25 MCG capsule Commonly known as: ROCALTROL Take 0.25 mcg by mouth daily.   FLUoxetine 10 MG capsule Commonly known as: PROZAC Take 1 capsule (10 mg total) by mouth daily.   hydrALAZINE 100 MG tablet Commonly known as: APRESOLINE Take 100 mg by mouth 3 (three) times daily.   loperamide 2 MG capsule Commonly known as: IMODIUM Take 2 mg by mouth daily as needed for diarrhea or loose stools.   multivitamin tablet Take 1 tablet by mouth daily.   OLANZapine 5 MG tablet Commonly known as: ZYPREXA Take 0.5 tablets (2.5 mg total) by mouth at bedtime. Patient has supplies   rosuvastatin 5 MG tablet Commonly known as: CRESTOR TAKE 1 TABLET DAILY   traZODone 100 MG tablet Commonly known as: DESYREL Take 1 tablet (100 mg total) by mouth at bedtime.   Vitamin D (Cholecalciferol) 25 MCG (1000 UT) Tabs Take 1,000 tablets by mouth daily.        Allergies:  Allergies  Allergen Reactions   Mirtazapine Rash   Aspirin     Other reaction(s): Unknown Patient was instructed not to take aspirin but can not remember why   Atomoxetine Other (See Comments)    Urinary sx   Strattera [Atomoxetine Hcl] Other (See Comments)    Urinary sx   Xyzal [Levocetirizine Dihydrochloride] Rash    Patient reported on 11/02/17    Family History: Family History  Problem Relation Age of Onset   Depression Sister    Bipolar disorder Other    Prostate cancer Neg Hx    Kidney cancer Neg Hx    Bladder Cancer Neg Hx     Social History:  reports that he quit smoking about 48 years ago. His smoking use included cigarettes. He has never used smokeless tobacco. He reports previous alcohol use. He reports that he does not use drugs.  ROS: For pertinent review of systems please refer to history of present illness  Physical Exam: BP  (!) 147/80   Pulse 84   Ht _0  (1.727 m)   Wt 200 lb (90.7 kg)   BMI 30.41 kg/m   Constitutional:  Well nourished. Alert and oriented, No acute distress. HEENT: [place] AT, mask in place.  Trachea midline Cardiovascular: No clubbing, cyanosis, or edema. Respiratory: Normal respiratory effort, no increased work of breathing. GU: No CVA tenderness.  No bladder fullness or masses.  Patient with uncircumcised phallus.  Foreskin easily retracted  Urethral meatus is patent.  No penile discharge. No penile lesions or rashes. Prosthetic cylinders in place.  Scrotum without lesions, cysts, rashes and/or edema.  Prosthetic pump in place.  Testicles are located scrotally bilaterally. No masses are appreciated in the testicles. Left and right epididymis are normal. Rectal: Patient with  normal sphincter tone. Anus and perineum without scarring or rashes. No rectal masses are appreciated. Prostate is approximately 55 grams, could only palpate the apex and the midportion of the gland, no nodules are appreciated. Seminal vesicles could not be palpated Neurologic: Grossly intact, no focal deficits, moving all 4 extremities. Psychiatric: Normal mood and affect.   Laboratory Data:  Component     Latest Ref Rng & Units 10/01/2020  TSH     0.450 - 4.500 uIU/mL 0.416 (L)  Thyroxine (T4)     4.5 - 12.0 ug/dL 7.6  T3 Uptake Ratio     24 - 39 % 25  Free Thyroxine Index     1.2 - 4.9 1.9  I have reviewed the labs.  Pertinent Imaging N/A  Assessment & Plan:    1. Elevated PSA - PSA- stable   2. Erectile dysfunction of organic origin - Prosthesis in place  3. BPH with obstruction/lower urinary tract symptoms -symptoms stable   4. Hematuria, unspecified type - no reports of gross hematuria  Return in about 6 months (around 04/15/2021) for I PSS and PSA.  These notes generated with voice recognition software. I apologize for typographical errors.  [place]  [address] [place], [place] [address] [phone]   Zara Council, PA-C

## 2020-10-13 ENCOUNTER — Ambulatory Visit (INDEPENDENT_AMBULATORY_CARE_PROVIDER_SITE_OTHER): Payer: Medicare Other | Admitting: Urology

## 2020-10-13 ENCOUNTER — Other Ambulatory Visit: Payer: Self-pay

## 2020-10-13 ENCOUNTER — Encounter: Payer: Self-pay | Admitting: Urology

## 2020-10-13 VITALS — BP 147/80 | HR 84 | Ht 68.0 in | Wt 200.0 lb

## 2020-10-13 DIAGNOSIS — N138 Other obstructive and reflux uropathy: Secondary | ICD-10-CM | POA: Diagnosis not present

## 2020-10-13 DIAGNOSIS — N401 Enlarged prostate with lower urinary tract symptoms: Secondary | ICD-10-CM

## 2020-10-13 DIAGNOSIS — R972 Elevated prostate specific antigen [PSA]: Secondary | ICD-10-CM

## 2020-10-13 DIAGNOSIS — N529 Male erectile dysfunction, unspecified: Secondary | ICD-10-CM | POA: Diagnosis not present

## 2020-10-13 DIAGNOSIS — R319 Hematuria, unspecified: Secondary | ICD-10-CM | POA: Diagnosis not present

## 2020-10-20 ENCOUNTER — Other Ambulatory Visit: Payer: Self-pay | Admitting: Child and Adolescent Psychiatry

## 2020-10-20 DIAGNOSIS — F3176 Bipolar disorder, in full remission, most recent episode depressed: Secondary | ICD-10-CM

## 2020-10-20 NOTE — Telephone Encounter (Signed)
Dr. Charlcie Cradle pt

## 2020-10-31 ENCOUNTER — Encounter: Payer: Self-pay | Admitting: Psychiatry

## 2020-10-31 ENCOUNTER — Other Ambulatory Visit: Payer: Self-pay

## 2020-10-31 ENCOUNTER — Ambulatory Visit (INDEPENDENT_AMBULATORY_CARE_PROVIDER_SITE_OTHER): Payer: Medicare Other | Admitting: Psychiatry

## 2020-10-31 VITALS — BP 135/69 | HR 70 | Temp 98.5°F | Wt 198.6 lb

## 2020-10-31 DIAGNOSIS — F3176 Bipolar disorder, in full remission, most recent episode depressed: Secondary | ICD-10-CM | POA: Diagnosis not present

## 2020-10-31 DIAGNOSIS — R946 Abnormal results of thyroid function studies: Secondary | ICD-10-CM | POA: Diagnosis not present

## 2020-10-31 DIAGNOSIS — R030 Elevated blood-pressure reading, without diagnosis of hypertension: Secondary | ICD-10-CM | POA: Diagnosis not present

## 2020-10-31 DIAGNOSIS — G4701 Insomnia due to medical condition: Secondary | ICD-10-CM | POA: Diagnosis not present

## 2020-10-31 MED ORDER — LAMOTRIGINE 25 MG PO TABS: 25.0000 mg | ORAL_TABLET | Freq: Every day | ORAL | 0 refills | Status: DC

## 2020-10-31 NOTE — Patient Instructions (Signed)
Lamotrigine tablets What is this medication? LAMOTRIGINE (la MOE Hendricks Limes) is used to control seizures in adults and children with epilepsy and Lennox-Gastaut syndrome. It is also used in adultsto treat bipolar disorder. This medicine may be used for other purposes; ask your health care provider orpharmacist if you have questions. COMMON BRAND NAME(S): Lamictal, Subvenite What should I tell my care team before I take this medication? They need to know if you have any of these conditions: heart disease history of irregular heartbeat immune system problems kidney disease liver disease low levels of folic acid in the blood lupus mental illness suicidal thoughts, plans, or attempt; a previous suicide attempt by you or a family member an unusual or allergic reaction to lamotrigine or other seizure medications, other medicines, foods, dyes, or preservatives pregnant or trying to get pregnant breast-feeding How should I use this medication? Take this medicine by mouth with a glass of water. Follow the directions on the prescription label. Do not chew these tablets. If this medicine upsets your stomach, take it with food or milk. Take your doses at regular intervals. Donot take your medicine more often than directed. A special MedGuide will be given to you by the pharmacist with each newprescription and refill. Be sure to read this information carefully each time. Talk to your pediatrician regarding the use of this medicine in children. While this drug may be prescribed for children as young as 2 years for selectedconditions, precautions do apply. Overdosage: If you think you have taken too much of this medicine contact apoison control center or emergency room at once. NOTE: This medicine is only for you. Do not share this medicine with others. What if I miss a dose? If you miss a dose, take it as soon as you can. If it is almost time for yournext dose, take only that dose. Do not take double or  extra doses. What may interact with this medication? atazanavir birth control pills certain medicines for irregular heartbeat certain medicines for seizures like carbamazepine, phenobarbital, phenytoin, primidone, valproic acid lopinavir rifampin ritonavir This list may not describe all possible interactions. Give your health care provider a list of all the medicines, herbs, non-prescription drugs, or dietary supplements you use. Also tell them if you smoke, drink alcohol, or use illegaldrugs. Some items may interact with your medicine. What should I watch for while using this medication? Visit your doctor or health care providerfor regular checks on your progress. If you take this medicine for seizures, wear a Medic Alert bracelet or necklace. Carry an identification card with information about your condition,medicines, and doctor or health care provider. It is important to take this medicine exactly as directed. When first starting treatment, your dose will need to be adjusted slowly. It may take weeks or months before your dose is stable. You should contact your doctor or health care providerif your seizures get worse or if you have any new types of seizures. Do not stop taking this medicine unless instructed by your doctor or health care provider. Stopping your medicine suddenly can increase yourseizures or their severity. This medicine may cause serious skin reactions. They can happen weeks to months after starting the medicine. Contact your health care provider right away if you notice fevers or flu-like symptoms with a rash. The rash may be red or purple and then turn into blisters or peeling of the skin. Or, you might notice a red rash with swelling of the face, lips or lymph nodes in your neck or  underyour arms. You may get drowsy, dizzy, or have blurred vision. Do not drive, use machinery, or do anything that needs mental alertness until you know how this medicine affects you. To reduce dizzy  or fainting spells, do not sit or stand up quickly, especially if you are an older patient. Alcohol can increasedrowsiness and dizziness. Avoid alcoholic drinks. If you are taking this medicine for bipolar disorder, it is important to report any changes in your mood to your doctor or health care provider. If your condition gets worse, you get mentally depressed, feel very hyperactive or manic, have difficulty sleeping, or have thoughts of hurting yourself or committing suicide, you need to get help from your health care providerright away. If you are a caregiver for someone taking this medicine for bipolar disorder, you should also report these behavioral changes right away. The use of this medicine may increase the chance of suicidal thoughts or actions. Payspecial attention to how you are responding while on this medicine. Your mouth may get dry. Chewing sugarless gum or sucking hard candy, and drinking plenty of water may help. Contact your doctor if the problem does notgo away or is severe. Women who become pregnant while using this medicine may enroll in the [place] Pregnancy Registry by calling [phone]. This registry collects information about the safety of antiepileptic drug use duringpregnancy. This medicine may cause a decrease in folic acid. You should make sure that you get enough folic acid while you are taking this medicine. Discuss the foods youeat and the vitamins you take with your health care provider. What side effects may I notice from receiving this medication? Side effects that you should report to your doctor or health care professionalas soon as possible: allergic reactions (skin rash, itching or hives; swelling of the face, lips, or tongue) aseptic meningitis (stiff neck; sensitivity to light; headache; drowsiness; fever; nausea, vomiting; rash) changes in vision heartbeat rhythm changes (trouble breathing; chest pain; dizziness; fast, irregular  heartbeat; feeling faint or lightheaded, falls) infection (fever, chills, cough, sore throat, pain or trouble passing urine) liver injury (dark yellow or brown urine; general ill feeling or flu-like symptoms; loss of appetite, right upper belly pain; unusually weak or tired, yellowing of the eyes or skin) low red blood cell counts (trouble breathing; feeling faint; lightheaded, falls; unusually weak or tired) rash, fever, and swollen lymph nodes redness, blistering, peeling, or loosening of the skin, including inside the mouth seizures suicidal thoughts, mood changes unusual bruising or bleeding Side effects that usually do not require medical attention (report to yourdoctor or health care professional if they continue or are bothersome): diarrhea dizziness nausea stomach pain tremors vomiting This list may not describe all possible side effects. Call your doctor for medical advice about side effects. You may report side effects to FDA at1-800-FDA-1088. Where should I keep my medication? Keep out of the reach of children and pets. Store at [employer] [address] (77 degrees F). Protect from light. Get rid of any unusedmedicine after the expiration date. To get rid of medicines that are no longer needed or have expired: Take the medicine to a medicine take-back program. Check with your pharmacy or law enforcement to find a location. If you cannot return the medicine, check the label or package insert to see if the medicine should be thrown out in the garbage or flushed down the toilet. If you are not sure, ask your health care provider. If it is safe to put it in the trash,  empty the medicine out of the container. Mix the medicine with cat litter, dirt, coffee grounds, or other unwanted substance. Seal the mixture in a bag or container. Put it in the trash. NOTE: This sheet is a summary. It may not cover all possible information. If you have questions about this medicine, talk to your doctor, pharmacist,  orhealth care provider.  2022 Elsevier/Gold Standard (2019-06-21 10:26:40)

## 2020-10-31 NOTE — Progress Notes (Signed)
Early MD OP Progress Note  10/31/2020 12:52 PM Luis Coss.  MRN:  [id number]  Chief Complaint:  Chief Complaint   Follow-up; Depression; Anxiety    HPI: Luis Milleris a 75 year old Caucasian male, divorced, retired, lives in [place], has a history of bipolar disorder, currently in remission, chronic kidney disease stage IV, multiple other medical problems was evaluated in office today.  Patient today reports he is currently doing well with the lower dosage of olanzapine.  He has not noticed any worsening of mood symptoms since being on the lower dose.  He denies any significant sleep problems and reports sleep is okay at this time.  He does use CPAP regularly.  Patient continues to have short-term memory problems however reports he is able to pay his bills, drive okay and it does not affect his functioning.  Patient with TSH abnormality-had repeat thyroid function test done at his primary care office.  Reviewed and discussed TSH dated 10/01/2020-0.416.  T4 total, T3 and free thyroxine index are within normal limits.  Patient has been referred to endocrinologist for further management per his primary provider Ms.Holdsworth.  Patient with elevated blood pressure reading in session today, will need blood pressure monitoring.  We will coordinate care with primary care provider.  Patient is agreeable to starting a mood stabilizer like Lamictal since he is being tapered off of the olanzapine.  Provided education.  Patient denies suicidality, homicidality or perceptual disturbances.  Denies any other concerns today.    Visit Diagnosis:    ICD-10-CM   1. Bipolar disorder, in full remission, most recent episode depressed (HCC)  F31.76 lamoTRIgine (LAMICTAL) 25 MG tablet    2. Insomnia due to medical condition  G47.01    mood, osa    3. Abnormal thyroid function test  R94.6     4. Elevated blood pressure reading  R03.0       Past Psychiatric History: Reviewed past psychiatric  history from progress note on 04/24/2020.  Past trials of medications like lithium, Ambien, Sonata, Seroquel, mirtazapine, Strattera, lithium.  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[place])    Paraphimosis    Peyronie disease    Secondary hyperparathyroidism of renal origin ([place])    Skin cancer    Testicular hypofunction    Urinary frequency    Urinary hesitancy    Wears hearing aid in both ears     Past Surgical History:  Procedure Laterality Date   arm fracture     ARTERY BIOPSY Left 07/04/2020   Procedure: BIOPSY TEMPORAL ARTERY;  Surgeon: Katha Cabal, MD;  Location: [facility];  Service: Vascular;  Laterality: Left;   BONE MARROW BIOPSY     CATARACT EXTRACTION W/PHACO Right 04/09/2020   Procedure: CATARACT EXTRACTION PHACO AND INTRAOCULAR LENS PLACEMENT (IOC) RIGHT EYHANCE TORIC 6.84 01:03.7 10.7%;  Surgeon: Leandrew Koyanagi, MD;  Location: [facility];  Service: Ophthalmology;  Laterality: Right;  sleep apnea   CATARACT EXTRACTION W/PHACO Left 05/07/2020   Procedure: CATARACT EXTRACTION PHACO AND INTRAOCULAR LENS PLACEMENT (IOC) LEFT EYHANCE TORIC 2.93 00:58.7 5.0%;  Surgeon: Leandrew Koyanagi, MD;  Location: [facility];  Service: Ophthalmology;  Laterality: Left;   COLONOSCOPY WITH PROPOFOL N/A 12/23/2014   Procedure: COLONOSCOPY WITH PROPOFOL;  Surgeon: Manya Silvas, MD;  Location[facility];  Service: Endoscopy;  Laterality: N/A;   COLONOSCOPY WITH PROPOFOL N/A 02/06/2018    Procedure: COLONOSCOPY WITH PROPOFOL;  Surgeon: Manya Silvas, MD;  Location[facility] END[facility];  Service: Endoscopy;  Laterality: N/A;   KNEE ARTHROPLASTY Left 02/22/2018   Procedure: COMPUTER ASSISTED TOTAL KNEE ARTHROPLASTY;  Surgeon: Dereck Leep, MD;  Location: [facility];  Service: Orthopedics;  Laterality: Left;   MELANOMA EXCISION     PENILE PROSTHESIS IMPLANT     PENILE PROSTHESIS IMPLANT N/A 08/25/2015   Procedure: REPLACEMENT OF INFLATABLE PENILE PROSTHESIS COMPONENTS;  Surgeon: Cleon Gustin, MD;  Location: [facility];  Service: Urology;  Laterality: N/A;   SKIN CANCER EXCISION     nose   TONSILLECTOMY     VASECTOMY      Family Psychiatric History: I have reviewed family psychiatric history from progress note on 04/24/2020.  Family History:  Family History  Problem Relation Age of Onset   Depression Sister    Bipolar disorder Other    Prostate cancer Neg Hx    Kidney cancer Neg Hx    Bladder Cancer Neg Hx     Social History: Reviewed social history from progress note on 04/24/2020. Social History   Socioeconomic History   Marital status: Divorced    Spouse name: Not on file   Number of children: 2   Years of education: Not on file   Highest education level: High school graduate  Occupational History   Occupation: retired  Tobacco Use   Smoking status: Former    Types: Cigarettes    Quit date: 09/08/1972    Years since quitting: 48.1   Smokeless tobacco: Never  Vaping Use   Vaping Use: Never used  Substance and Sexual Activity   Alcohol use: Not Currently    Alcohol/week: 0.0 standard drinks    Comment: Occasional glass of wine   Drug use: No   Sexual activity: Yes    Partners: Female    Birth control/protection: None  Other Topics Concern   Not on file  Social History Narrative   Not on file   Social Determinants of Health   Financial Resource Strain: Low Risk    Difficulty of Paying Living Expenses: Not hard at all  Food Insecurity: No Food  Insecurity   Worried About Charity fundraiser in the Last Year: Never true   [place] in the Last Year: Never true  Transportation Needs: No Transportation Needs   Lack of Transportation (Medical): No   Lack of Transportation (Non-Medical): No  Physical Activity: Sufficiently Active   Days of Exercise per Week: 7 days   Minutes of Exercise per Session: 30 min  Stress: No Stress Concern Present   Feeling of Stress : Not at all  Social Connections: Not on file    Allergies:  Allergies  Allergen Reactions   Mirtazapine Rash   Aspirin     Other reaction(s): Unknown Patient was instructed not to take aspirin but can not remember why   Atomoxetine Other (See Comments)    Urinary sx   Strattera [  Atomoxetine Hcl] Other (See Comments)    Urinary sx   Xyzal [Levocetirizine Dihydrochloride] Rash    Patient reported on 07/03/21    Metabolic Disorder Labs: Lab Results  Component Value Date   HGBA1C 5.2 05/19/2020   Lab Results  Component Value Date   PROLACTIN 15.4 (H) 05/19/2020   Lab Results  Component Value Date   CHOL 227 (H) 05/19/2020   TRIG 147 05/19/2020   HDL 45 05/19/2020   CHOLHDL 5.0 05/19/2020   VLDL 50 (H) 10/03/2017   LDLCALC 155 (H) 05/19/2020   LDLCALC 130 (H) 04/26/2019   Lab Results  Component Value Date   TSH 0.416 (L) 10/01/2020   TSH 0.313 (L) 05/19/2020    Therapeutic Level Labs: Lab Results  Component Value Date   LITHIUM 0.70 08/06/2019   LITHIUM 0.78 08/05/2019   No results found for: VALPROATE No components found for:  CBMZ  Current Medications: Current Outpatient Medications  Medication Sig Dispense Refill   allopurinol (ZYLOPRIM) 100 MG tablet Take 100 mg by mouth daily.     amLODipine (NORVASC) 10 MG tablet TAKE 1 TABLET DAILY 90 tablet 0   benazepril (LOTENSIN) 40 MG tablet Take 40 mg by mouth daily.      calcitRIOL (ROCALTROL) 0.25 MCG capsule Take 0.25 mcg by mouth daily.     FLUoxetine (PROZAC) 10 MG capsule Take 1  capsule (10 mg total) by mouth daily. 90 capsule 1   hydrALAZINE (APRESOLINE) 100 MG tablet Take 100 mg by mouth 3 (three) times daily.     lamoTRIgine (LAMICTAL) 25 MG tablet Take 1 tablet (25 mg total) by mouth daily. 90 tablet 0   loperamide (IMODIUM) 2 MG capsule Take 2 mg by mouth daily as needed for diarrhea or loose stools.     Multiple Vitamin (MULTIVITAMIN) tablet Take 1 tablet by mouth daily.     rosuvastatin (CRESTOR) 5 MG tablet TAKE 1 TABLET DAILY 90 tablet 0   traZODone (DESYREL) 100 MG tablet Take 1 tablet (100 mg total) by mouth at bedtime. 90 tablet 1   Vitamin D, Cholecalciferol, 25 MCG (1000 UT) TABS Take 1,000 tablets by mouth daily.      No current facility-administered medications for this visit.     Musculoskeletal: Strength & Muscle Tone: within normal limits Gait & Station: normal Patient leans: N/A  Psychiatric Specialty Exam: Review of Systems  Psychiatric/Behavioral:  The patient is nervous/anxious.   All other systems reviewed and are negative.  Blood pressure 135/69, pulse 70, temperature 98.5 F (36.9 C), temperature source Temporal, weight 198 lb 9.6 oz (90.1 kg).Body mass index is 30.2 kg/m.  General Appearance: Casual  Eye Contact:  Fair  Speech:  Clear and Coherent  Volume:  Normal  Mood:  Anxious coping ok  Affect:  Congruent  Thought Process:  Goal Directed and Descriptions of Associations: Intact  Orientation:  Full (Time, Place, and Person)  Thought Content: Logical   Suicidal Thoughts:  No  Homicidal Thoughts:  No  Memory:  Immediate;   Fair Recent;   Fair Remote;   Fair reports short term memory loss  Judgement:  Fair  Insight:  Good  Psychomotor Activity:  Normal  Concentration:  Concentration: Fair and Attention Span: Fair  Recall:  [employer] of Knowledge: Fair  Language: Fair  Akathisia:  No  Handed:  Right  AIMS (if indicated): done  Assets:  Communication Skills Desire for  Improvement Housing Transportation Vocational/Educational  ADL's:  Intact  Cognition: WNL  Sleep:  Good   Screenings: [place] Office Visit from 09/30/2020 in [facility] Total Score 0      GAD-7    [place] Office Visit from 10/31/2020 in [facility]  Total GAD-7 Score 0      Mini-Mental    [name] Office Visit from 02/23/2017 in [facility]  Total Score (max 30 points ) 28      PHQ2-9    [place] Visit from 10/31/2020 in [place] Office Visit from 09/30/2020 in [facility] Video Visit from 06/25/2020 in [place] from 11/30/2019 in [facility] from 11/20/2018 in [facility]  PHQ-2 Total Score 0 0 0 0 0      [place] Office Visit from 10/31/2020 in [facility] Admission (Discharged) from 07/04/2020 in [facility] ED from 06/30/2020 in [place] No Risk No Risk No Risk        Assessment and Plan: Luis Coss. Is a 75 year old male with history of bipolar disorder, chronic kidney disease stage IV, multiple medical problems was evaluated in office today.  Patient is biologically predisposed given history of mental health problems in his family.  Patient is currently stable.  Patient with abnormal thyroid labs , has been referred to endocrinologist.  Patient is tolerating being tapered off of the olanzapine well, will continue to taper off and will replace with Lamictal.  Plan as noted below.  Plan Bipolar disorder in full remission Discontinue olanzapine. Start Lamictal 25 mg p.o. daily.  Discussed medication side effects including the risk of Stevens-Johnson  syndrome. Trazodone 100 mg p.o. nightly Fluoxetine 10 mg p.o. daily  Insomnia-stable Patient with increased urination which does have an impact on his sleep-currently managing well. Continue CPAP. We will consider repeating sleep study as needed Continue sleep hygiene techniques  Elevated blood pressure reading-163/82, repeat blood pressure 135/69.  Patient advised to monitor blood pressure and to follow-up with primary care provider.  We will coordinate care.  Follow-up in clinic in 1 month or sooner if needed.  This note was generated in part or whole with voice recognition software. Voice recognition is usually quite accurate but there are transcription errors that can and very often do occur. I apologize for any typographical errors that were not detected and corrected.       Ursula Alert, MD 10/31/2020, 12:52 PM

## 2020-11-17 ENCOUNTER — Ambulatory Visit (INDEPENDENT_AMBULATORY_CARE_PROVIDER_SITE_OTHER): Payer: Medicare Other | Admitting: Nurse Practitioner

## 2020-11-17 ENCOUNTER — Encounter: Payer: Self-pay | Admitting: Nurse Practitioner

## 2020-11-17 ENCOUNTER — Other Ambulatory Visit: Payer: Self-pay

## 2020-11-17 VITALS — BP 156/79 | HR 60 | Temp 98.5°F | Ht 66.46 in | Wt 199.0 lb

## 2020-11-17 DIAGNOSIS — N184 Chronic kidney disease, stage 4 (severe): Secondary | ICD-10-CM | POA: Diagnosis not present

## 2020-11-17 DIAGNOSIS — I10 Essential (primary) hypertension: Secondary | ICD-10-CM | POA: Diagnosis not present

## 2020-11-17 DIAGNOSIS — E782 Mixed hyperlipidemia: Secondary | ICD-10-CM

## 2020-11-17 DIAGNOSIS — E0590 Thyrotoxicosis, unspecified without thyrotoxic crisis or storm: Secondary | ICD-10-CM | POA: Diagnosis not present

## 2020-11-17 MED ORDER — ROSUVASTATIN CALCIUM 10 MG PO TABS: 10.0000 mg | ORAL_TABLET | Freq: Every day | ORAL | 1 refills | Status: DC

## 2020-11-17 MED ORDER — AMLODIPINE BESYLATE 10 MG PO TABS: 10.0000 mg | ORAL_TABLET | Freq: Every day | ORAL | 1 refills | Status: DC

## 2020-11-17 NOTE — Progress Notes (Signed)
BP (!) 156/79   Pulse 60   Temp 98.5 F (36.9 C)   Ht 5' 6.46" (1.688 m)   Wt 199 lb (90.3 kg)   SpO2 96%   BMI 31.68 kg/m    Subjective:    Patient ID: Luis Coss., male    DOB: 1945/04/17, 75 y.o.   MRN: [id number]  HPI: Luis Renz. is a 75 y.o. male  Chief Complaint  Patient presents with   Annual Exam   HYPERTENSION / HYPERLIPIDEMIA Satisfied with current treatment? no Duration of hypertension: years BP monitoring frequency: not checking BP range:  BP medication side effects: no Past BP meds:  hydralazine, amlodipine, and benazepril Duration of hyperlipidemia: years Cholesterol medication side effects: no Cholesterol supplements: none Past cholesterol medications: rosuvastatin (crestor) Medication compliance: excellent compliance Aspirin: no Recent stressors: no Recurrent headaches: no Visual changes: no Palpitations: no Dyspnea: no Chest pain: no Lower extremity edema: no Dizzy/lightheaded: no  Relevant past medical, surgical, family and social history reviewed and updated as indicated. Interim medical history since our last visit reviewed. Allergies and medications reviewed and updated.  Review of Systems  Eyes:  Negative for visual disturbance.  Respiratory:  Negative for chest tightness and shortness of breath.   Cardiovascular:  Negative for chest pain, palpitations and leg swelling.  Neurological:  Negative for dizziness, light-headedness and headaches.   Per HPI unless specifically indicated above     Objective:    BP (!) 156/79   Pulse 60   Temp 98.5 F (36.9 C)   Ht 5' 6.46" (1.688 m)   Wt 199 lb (90.3 kg)   SpO2 96%   BMI 31.68 kg/m   Wt Readings from Last 3 Encounters:  11/17/20 199 lb (90.3 kg)  10/13/20 200 lb (90.7 kg)  07/18/20 193 lb 3.2 oz (87.6 kg)    Physical Exam Vitals and nursing note reviewed.  Constitutional:      General: He is not in acute distress.    Appearance: Normal appearance. He is not  ill-appearing, toxic-appearing or diaphoretic.  HENT:     Head: Normocephalic.     Right Ear: External ear normal.     Left Ear: External ear normal.     Nose: Nose normal. No congestion or rhinorrhea.     Mouth/Throat:     Mouth: Mucous membranes are moist.  Eyes:     General:        Right eye: No discharge.        Left eye: No discharge.     Extraocular Movements: Extraocular movements intact.     Conjunctiva/sclera: Conjunctivae normal.     Pupils: Pupils are equal, round, and reactive to light.  Cardiovascular:     Rate and Rhythm: Normal rate and regular rhythm.     Heart sounds: No murmur heard. Pulmonary:     Effort: Pulmonary effort is normal. No respiratory distress.     Breath sounds: Normal breath sounds. No wheezing, rhonchi or rales.  Abdominal:     General: Abdomen is flat. Bowel sounds are normal.  Musculoskeletal:     Cervical back: Normal range of motion and neck supple.  Skin:    General: Skin is warm and dry.     Capillary Refill: Capillary refill takes less than 2 seconds.  Neurological:     General: No focal deficit present.     Mental Status: He is alert and oriented to person, place, and time.  Psychiatric:  Mood and Affect: Mood normal.        Behavior: Behavior normal.        Thought Content: Thought content normal.        Judgment: Judgment normal.    Results for orders placed or performed in visit on 10/02/20  PSA  Result Value Ref Range   Prostate Specific Ag, Serum 5.8 (H) 0.0 - 4.0 ng/mL      Assessment & Plan:   Problem List Items Addressed This Visit       Cardiovascular and Mediastinum   Essential hypertension    Chronic.  Controlled.  Continue with current medication regimen.  Labs ordered today.  Return to clinic in 6 months for reevaluation.  Call sooner if concerns arise.        Relevant Medications   rosuvastatin (CRESTOR) 10 MG tablet   amLODipine (NORVASC) 10 MG tablet   Other Relevant Orders   Comp Met (CMET)      Genitourinary   CKD (chronic kidney disease) stage 4, GFR 15-29 ml/min (HCC)    Chronic.  Controlled.  Continue with current medication regimen.  Continue to follow up with Endocrinology. Labs ordered today.  Return to clinic in 6 months for reevaluation.  Call sooner if concerns arise.        Relevant Orders   Comp Met (CMET)     Other   Hyperlipidemia - Primary    Chronic.  Controlled.  Continue with current medication regimen.  Increased dose of Crestor from 1m to 128m  Goal is to get patient to Crestor 2027m Labs ordered today.  Return to clinic in 6 months for reevaluation.  Call sooner if concerns arise.        Relevant Medications   rosuvastatin (CRESTOR) 10 MG tablet   amLODipine (NORVASC) 10 MG tablet   Other Relevant Orders   Lipid Profile   Other Visit Diagnoses     Hyperthyroidism       Labs ordered today.  Patient has appointment with Endocrinology in November.  Recommend keep appointment for further evaluation and treatment.    Relevant Orders   T4, free   TSH        Follow up plan: Return in about 6 months (around 05/19/2021) for HTN, HLD, DM2 FU.

## 2020-11-17 NOTE — Assessment & Plan Note (Signed)
Chronic.  Controlled.  Continue with current medication regimen.  Increased dose of Crestor from 42m to 198m  Goal is to get patient to Crestor 2087m Labs ordered today.  Return to clinic in 6 months for reevaluation.  Call sooner if concerns arise.

## 2020-11-17 NOTE — Assessment & Plan Note (Signed)
Chronic.  Controlled.  Continue with current medication regimen.  Labs ordered today.  Return to clinic in 6 months for reevaluation.  Call sooner if concerns arise.  ? ?

## 2020-11-17 NOTE — Assessment & Plan Note (Signed)
Chronic.  Controlled.  Continue with current medication regimen.  Continue to follow up with Endocrinology. Labs ordered today.  Return to clinic in 6 months for reevaluation.  Call sooner if concerns arise.

## 2020-11-18 LAB — COMPREHENSIVE METABOLIC PANEL
ALT: 12 IU/L (ref 0–44)
AST: 20 IU/L (ref 0–40)
Albumin/Globulin Ratio: 2 (ref 1.2–2.2)
Albumin: 4.3 g/dL (ref 3.7–4.7)
Alkaline Phosphatase: 65 IU/L (ref 44–121)
BUN/Creatinine Ratio: 10 (ref 10–24)
BUN: 40 mg/dL — ABNORMAL HIGH (ref 8–27)
Bilirubin Total: 0.4 mg/dL (ref 0.0–1.2)
CO2: 23 mmol/L (ref 20–29)
Calcium: 9.5 mg/dL (ref 8.6–10.2)
Chloride: 107 mmol/L — ABNORMAL HIGH (ref 96–106)
Creatinine, Ser: 4.04 mg/dL — ABNORMAL HIGH (ref 0.76–1.27)
Globulin, Total: 2.1 g/dL (ref 1.5–4.5)
Glucose: 103 mg/dL — ABNORMAL HIGH (ref 65–99)
Potassium: 3.6 mmol/L (ref 3.5–5.2)
Sodium: 144 mmol/L (ref 134–144)
Total Protein: 6.4 g/dL (ref 6.0–8.5)
eGFR: 15 mL/min/{1.73_m2} — ABNORMAL LOW (ref >59)

## 2020-11-18 LAB — LIPID PANEL
Chol/HDL Ratio: 3.8 ratio (ref 0.0–5.0)
Cholesterol, Total: 145 mg/dL (ref 100–199)
HDL: 38 mg/dL — ABNORMAL LOW (ref >39)
LDL Chol Calc (NIH): 84 mg/dL (ref 0–99)
Triglycerides: 131 mg/dL (ref 0–149)
VLDL Cholesterol Cal: 23 mg/dL (ref 5–40)

## 2020-11-18 LAB — T4, FREE: Free T4: 1.27 ng/dL (ref 0.82–1.77)

## 2020-11-18 LAB — TSH: TSH: 0.392 u[IU]/mL — ABNORMAL LOW (ref 0.450–4.500)

## 2020-11-18 NOTE — Progress Notes (Signed)
Hi Luis Miller.  It was good to see you yesterday.  Your lab work is stable.  Kidney function remains relatively the same with a Creatinine of 4.04.  Your cholesterol has improved which is great.  Thyroid labs are stable with prior.  Keep that appointment with Endo.  I will see you at our next visit. Please let me know if you have any questions.

## 2020-11-20 DIAGNOSIS — D631 Anemia in chronic kidney disease: Secondary | ICD-10-CM | POA: Diagnosis not present

## 2020-11-20 DIAGNOSIS — I10 Essential (primary) hypertension: Secondary | ICD-10-CM | POA: Diagnosis not present

## 2020-11-20 DIAGNOSIS — R809 Proteinuria, unspecified: Secondary | ICD-10-CM | POA: Diagnosis not present

## 2020-11-20 DIAGNOSIS — N2581 Secondary hyperparathyroidism of renal origin: Secondary | ICD-10-CM | POA: Diagnosis not present

## 2020-11-20 DIAGNOSIS — N185 Chronic kidney disease, stage 5: Secondary | ICD-10-CM | POA: Diagnosis not present

## 2020-12-01 ENCOUNTER — Ambulatory Visit (INDEPENDENT_AMBULATORY_CARE_PROVIDER_SITE_OTHER): Payer: Medicare Other

## 2020-12-01 VITALS — Ht 68.0 in | Wt 198.0 lb

## 2020-12-01 DIAGNOSIS — Z0000 Encounter for general adult medical examination without abnormal findings: Secondary | ICD-10-CM | POA: Diagnosis not present

## 2020-12-01 NOTE — Patient Instructions (Signed)
Luis Miller , Thank you for taking time to come for your Medicare Wellness Visit. I appreciate your ongoing commitment to your health goals. Please review the following plan we discussed and let me know if I can assist you in the future.   Screening recommendations/referrals: Colonoscopy: not required Recommended yearly ophthalmology/optometry visit for glaucoma screening and checkup Recommended yearly dental visit for hygiene and checkup  Vaccinations: Influenza vaccine: due Pneumococcal vaccine: n/a Tdap vaccine: completed 08/12/2011, due 08/11/2021 Shingles vaccine: discussed   Covid-19:  03/31/2020, 06/07/2019, 05/17/2019  Advanced directives: Please bring a copy of your POA (Power of Attorney) and/or Living Will to your next appointment.   Conditions/risks identified: none  Next appointment: Follow up in one year for your annual wellness visit.   Preventive Care 65 Years and Older, Male Preventive care refers to lifestyle choices and visits with your health care provider that can promote health and wellness. What does preventive care include? A yearly physical exam. This is also called an annual well check. Dental exams once or twice a year. Routine eye exams. Ask your health care provider how often you should have your eyes checked. Personal lifestyle choices, including: Daily care of your teeth and gums. Regular physical activity. Eating a healthy diet. Avoiding tobacco and drug use. Limiting alcohol use. Practicing safe sex. Taking low doses of aspirin every day. Taking vitamin and mineral supplements as recommended by your health care provider. What happens during an annual well check? The services and screenings done by your health care provider during your annual well check will depend on your age, overall health, lifestyle risk factors, and family history of disease. Counseling  Your health care provider may ask you questions about your: Alcohol use. Tobacco use. Drug  use. Emotional well-being. Home and relationship well-being. Sexual activity. Eating habits. History of falls. Memory and ability to understand (cognition). Work and work Statistician. Screening  You may have the following tests or measurements: Height, weight, and BMI. Blood pressure. Lipid and cholesterol levels. These may be checked every 5 years, or more frequently if you are over 73 years old. Skin check. Lung cancer screening. You may have this screening every year starting at age 9 if you have a 30-pack-year history of smoking and currently smoke or have quit within the past 15 years. Fecal occult blood test (FOBT) of the stool. You may have this test every year starting at age 63. Flexible sigmoidoscopy or colonoscopy. You may have a sigmoidoscopy every 5 years or a colonoscopy every 10 years starting at age 56. Prostate cancer screening. Recommendations will vary depending on your family history and other risks. Hepatitis C blood test. Hepatitis B blood test. Sexually transmitted disease (STD) testing. Diabetes screening. This is done by checking your blood sugar (glucose) after you have not eaten for a while (fasting). You may have this done every 1-3 years. Abdominal aortic aneurysm (AAA) screening. You may need this if you are a current or former smoker. Osteoporosis. You may be screened starting at age 60 if you are at high risk. Talk with your health care provider about your test results, treatment options, and if necessary, the need for more tests. Vaccines  Your health care provider may recommend certain vaccines, such as: Influenza vaccine. This is recommended every year. Tetanus, diphtheria, and acellular pertussis (Tdap, Td) vaccine. You may need a Td booster every 10 years. Zoster vaccine. You may need this after age 71. Pneumococcal 13-valent conjugate (PCV13) vaccine. One dose is recommended after age  65. Pneumococcal polysaccharide (PPSV23) vaccine. One dose is  recommended after age 70. Talk to your health care provider about which screenings and vaccines you need and how often you need them. This information is not intended to replace advice given to you by your health care provider. Make sure you discuss any questions you have with your health care provider. Document Released: 04/04/2015 Document Revised: 11/26/2015 Document Reviewed: 01/07/2015 Elsevier Interactive Patient Education  2017 [place] Prevention in the Home Falls can cause injuries. They can happen to people of all ages. There are many things you can do to make your home safe and to help prevent falls. What can I do on the outside of my home? Regularly fix the edges of walkways and driveways and fix any cracks. Remove anything that might make you trip as you walk through a door, such as a raised step or threshold. Trim any bushes or trees on the path to your home. Use bright outdoor lighting. Clear any walking paths of anything that might make someone trip, such as rocks or tools. Regularly check to see if handrails are loose or broken. Make sure that both sides of any steps have handrails. Any raised decks and porches should have guardrails on the edges. Have any leaves, snow, or ice cleared regularly. Use sand or salt on walking paths during winter. Clean up any spills in your garage right away. This includes oil or grease spills. What can I do in the bathroom? Use night lights. Install grab bars by the toilet and in the tub and shower. Do not use towel bars as grab bars. Use non-skid mats or decals in the tub or shower. If you need to sit down in the shower, use a plastic, non-slip stool. Keep the floor dry. Clean up any water that spills on the floor as soon as it happens. Remove soap buildup in the tub or shower regularly. Attach bath mats securely with double-sided non-slip rug tape. Do not have throw rugs and other things on the floor that can make you  trip. What can I do in the bedroom? Use night lights. Make sure that you have a light by your bed that is easy to reach. Do not use any sheets or blankets that are too big for your bed. They should not hang down onto the floor. Have a firm chair that has side arms. You can use this for support while you get dressed. Do not have throw rugs and other things on the floor that can make you trip. What can I do in the kitchen? Clean up any spills right away. Avoid walking on wet floors. Keep items that you use a lot in easy-to-reach places. If you need to reach something above you, use a strong step stool that has a grab bar. Keep electrical cords out of the way. Do not use floor polish or wax that makes floors slippery. If you must use wax, use non-skid floor wax. Do not have throw rugs and other things on the floor that can make you trip. What can I do with my stairs? Do not leave any items on the stairs. Make sure that there are handrails on both sides of the stairs and use them. Fix handrails that are broken or loose. Make sure that handrails are as long as the stairways. Check any carpeting to make sure that it is firmly attached to the stairs. Fix any carpet that is loose or worn. Avoid having throw rugs at  the top or bottom of the stairs. If you do have throw rugs, attach them to the floor with carpet tape. Make sure that you have a light switch at the top of the stairs and the bottom of the stairs. If you do not have them, ask someone to add them for you. What else can I do to help prevent falls? Wear shoes that: Do not have high heels. Have rubber bottoms. Are comfortable and fit you well. Are closed at the toe. Do not wear sandals. If you use a stepladder: Make sure that it is fully opened. Do not climb a closed stepladder. Make sure that both sides of the stepladder are locked into place. Ask someone to hold it for you, if possible. Clearly mark and make sure that you can  see: Any grab bars or handrails. First and last steps. Where the edge of each step is. Use tools that help you move around (mobility aids) if they are needed. These include: Canes. Walkers. Scooters. Crutches. Turn on the lights when you go into a dark area. Replace any light bulbs as soon as they burn out. Set up your furniture so you have a clear path. Avoid moving your furniture around. If any of your floors are uneven, fix them. If there are any pets around you, be aware of where they are. Review your medicines with your doctor. Some medicines can make you feel dizzy. This can increase your chance of falling. Ask your doctor what other things that you can do to help prevent falls. This information is not intended to replace advice given to you by your health care provider. Make sure you discuss any questions you have with your health care provider. Document Released: 01/02/2009 Document Revised: 08/14/2015 Document Reviewed: 04/12/2014 Elsevier Interactive Patient Education  2017 [employer].

## 2020-12-01 NOTE — Progress Notes (Signed)
I connected with Luis Miller. today by telephone and verified that I am speaking with the correct person using two identifiers. Location patient: home Location provider: work Persons participating in the virtual visit: Luis Miller., Glenna Durand LPN.   I discussed the limitations, risks, security and privacy concerns of performing an evaluation and management service by telephone and the availability of in person appointments. I also discussed with the patient that there may be a patient responsible charge related to this service. The patient expressed understanding and verbally consented to this telephonic visit.    Interactive audio and video telecommunications were attempted between this provider and patient, however failed, due to patient having technical difficulties OR patient did not have access to video capability.  We continued and completed visit with audio only.     Vital signs may be patient reported or missing.  Subjective:   Luis Hendon. is a 75 y.o. male who presents for Medicare Annual/Subsequent preventive examination.  Review of Systems     Cardiac Risk Factors include: advanced age (>6mn, >>15women);dyslipidemia;hypertension;male gender;obesity (BMI >30kg/m2)     Objective:    Today's Vitals   12/01/20 0941  Weight: 198 lb (89.8 kg)  Height: _0  (1.727 m)   Body mass index is 30.11 kg/m.  Advanced Directives 12/01/2020 07/04/2020 06/30/2020 05/07/2020 04/09/2020 11/30/2019 08/02/2019  Does Patient Have a Medical Advance Directive? Yes Yes No Yes Yes Yes No  Type of AParamedicof AGrove CityLiving will [place]Manistee LakeLiving will - [place]ag[place]Living will HW[place]Living will HFair OaksLiving will -  Does patient want to make changes to medical advance directive? - No - Patient declined - No - Patient declined No - Patient declined - -  Copy of HL[place]in Chart? No - copy requested No - copy requested - No - copy requested No - copy requested No - copy requested -  Would patient like information on creating a medical advance directive? - - - - - - No - Patient declined  Some encounter information is confidential and restricted. Go to Review Flowsheets activity to see all data.    Current Medications (verified) Outpatient Encounter Medications as of 12/01/2020  Medication Sig   allopurinol (ZYLOPRIM) 100 MG tablet Take 100 mg by mouth daily.   amLODipine (NORVASC) 10 MG tablet Take 1 tablet (10 mg total) by mouth daily.   benazepril (LOTENSIN) 40 MG tablet Take 40 mg by mouth daily.    calcitRIOL (ROCALTROL) 0.25 MCG capsule Take 0.25 mcg by mouth daily.   FLUoxetine (PROZAC) 10 MG capsule Take 1 capsule (10 mg total) by mouth daily.   hydrALAZINE (APRESOLINE) 100 MG tablet Take 100 mg by mouth 3 (three) times daily.   lamoTRIgine (LAMICTAL) 25 MG tablet Take 1 tablet (25 mg total) by mouth daily.   loperamide (IMODIUM) 2 MG capsule Take 2 mg by mouth daily as needed for diarrhea or loose stools.   Multiple Vitamin (MULTIVITAMIN) tablet Take 1 tablet by mouth daily.   rosuvastatin (CRESTOR) 10 MG tablet Take 1 tablet (10 mg total) by mouth daily.   traZODone (DESYREL) 100 MG tablet Take 1 tablet (100 mg total) by mouth at bedtime.   Vitamin D, Cholecalciferol, 25 MCG (1000 UT) TABS Take 1,000 tablets by mouth daily.    [DISCONTINUED] amLODipine (NORVASC) 10 MG tablet Take 1 tablet (10 mg total) by mouth daily.   [DISCONTINUED] FLUoxetine (PROZAC)  10 MG capsule Take 1 capsule (10 mg total) by mouth daily.   [DISCONTINUED] HYDROcodone-acetaminophen (NORCO) 5-325 MG tablet Take 1-2 tablets by mouth every 6 (six) hours as needed for moderate pain or severe pain.   [DISCONTINUED] OLANZapine (ZYPREXA) 5 MG tablet Take 1 tablet (5 mg total) by mouth at bedtime.   [DISCONTINUED] predniSONE (DELTASONE) 20 MG tablet Take 60 mg by mouth  daily.   [DISCONTINUED] rosuvastatin (CRESTOR) 5 MG tablet Take 1 tablet (5 mg total) by mouth daily.   [DISCONTINUED] traZODone (DESYREL) 100 MG tablet Take 1 tablet (100 mg total) by mouth at bedtime.   No facility-administered encounter medications on file as of 12/01/2020.    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[facility])    Erectile dysfunction    Gout    Hyperlipidemia    Hypertension    IBS (irritable bowel syndrome)    Impotence    Internal hemorrhoids    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[place])    Paraphimosis    Peyronie disease    Secondary hyperparathyroidism of renal origin ([place])    Skin cancer    Testicular hypofunction    Urinary frequency    Urinary hesitancy    Wears hearing aid in both ears    Past Surgical History:  Procedure Laterality Date   arm fracture     ARTERY BIOPSY Left 07/04/2020   Procedure: BIOPSY TEMPORAL ARTERY;  Surgeon: Katha Cabal, MD;  Location: [facility] ORS;  Service: Vascular;  Laterality: Left;   BONE MARROW BIOPSY     CATARACT EXTRACTION W/PHACO Right 04/09/2020   Procedure: CATARACT EXTRACTION PHACO AND INTRAOCULAR LENS PLACEMENT (IOC) RIGHT EYHANCE TORIC 6.84 01:03.7 10.7%;  Surgeon: Leandrew Koyanagi, MD;  Location: [place];  Service: Ophthalmology;  Laterality: Right;  sleep apnea   CATARACT EXTRACTION W/PHACO Left 05/07/2020   Procedure: CATARACT EXTRACTION PHACO AND INTRAOCULAR LENS PLACEMENT (IOC) LEFT EYHANCE TORIC 2.93 00:58.7 5.0%;  Surgeon: Leandrew Koyanagi, MD;  Location: [place];  Service:  Ophthalmology;  Laterality: Left;   COLONOSCOPY WITH PROPOFOL N/A 12/23/2014   Procedure: COLONOSCOPY WITH PROPOFOL;  Surgeon: Manya Silvas, MD;  Location: [facility] ENDOSCOPY;  Service: Endoscopy;  Laterality: N/A;   COLONOSCOPY WITH PROPOFOL N/A 02/06/2018   Procedure: COLONOSCOPY WITH PROPOFOL;  Surgeon: Manya Silvas, MD;  Location[facility] ENDOSCOPY;  Service: Endoscopy;  Laterality: N/A;   KNEE ARTHROPLASTY Left 02/22/2018   Procedure: COMPUTER ASSISTED TOTAL KNEE ARTHROPLASTY;  Surgeon: Dereck Leep, MD;  Location: [facility]S;  Service: Orthopedics;  Laterality: Left;   MELANOMA EXCISION     PENILE PROSTHESIS IMPLANT     PENILE PROSTHESIS IMPLANT N/A 08/25/2015   Procedure: REPLACEMENT OF INFLATABLE PENILE PROSTHESIS COMPONENTS;  Surgeon: Cleon Gustin, MD;  Location: [facility];  Service: Urology;  Laterality: N/A;   SKIN CANCER EXCISION     nose   TONSILLECTOMY     VASECTOMY     Family History  Problem Relation Age of Onset   Depression Sister    Bipolar disorder Other  Prostate cancer Neg Hx    Kidney cancer Neg Hx    Bladder Cancer Neg Hx    Social History   Socioeconomic History   Marital status: Divorced    Spouse name: Not on file   Number of children: 2   Years of education: Not on file   Highest education level: High school graduate  Occupational History   Occupation: retired  Tobacco Use   Smoking status: Former    Types: Cigarettes    Quit date: 09/08/1972    Years since quitting: 48.2   Smokeless tobacco: Never  Vaping Use   Vaping Use: Never used  Substance and Sexual Activity   Alcohol use: Not Currently    Alcohol/week: 0.0 standard drinks    Comment: Occasional glass of wine   Drug use: No   Sexual activity: Not Currently    Partners: Female    Birth control/protection: None  Other Topics Concern   Not on file  Social History Narrative   Not on file   Social Determinants of Health   Financial Resource Strain: Low Risk    Difficulty of  Paying Living Expenses: Not hard at all  Food Insecurity: No Food Insecurity   Worried About Charity fundraiser in the Last Year: Never true   [place] in the Last Year: Never true  Transportation Needs: No Transportation Needs   Lack of Transportation (Medical): No   Lack of Transportation (Non-Medical): No  Physical Activity: Insufficiently Active   Days of Exercise per Week: 2 days   Minutes of Exercise per Session: 40 min  Stress: No Stress Concern Present   Feeling of Stress : Not at all  Social Connections: Not on file    Tobacco Counseling Counseling given: Not Answered   Clinical Intake:  Pre-visit preparation completed: Yes  Pain : No/denies pain     Nutritional Status: BMI > 30  Obese Nutritional Risks: None Diabetes: No  How often do you need to have someone help you when you read instructions, pamphlets, or other written materials from your doctor or pharmacy?: 1 - Never What is the last grade level you completed in school?: 12th grade  Diabetic? no  Interpreter Needed?: No  Information entered by :: NAllen LPN   Activities of Daily Living In your present state of health, do you have any difficulty performing the following activities: 12/01/2020 11/17/2020  Hearing? Y N  Comment wears hearing aides -  Vision? Y N  Comment lost vision in an eye -  Difficulty concentrating or making decisions? Y Y  Comment trouble with memory -  Walking or climbing stairs? N N  Dressing or bathing? N N  Doing errands, shopping? N N  Preparing Food and eating ? N -  Using the Toilet? N -  In the past six months, have you accidently leaked urine? Y -  Comment at night sometimes -  Do you have problems with loss of bowel control? N -  Managing your Medications? N -  Managing your Finances? N -  Housekeeping or managing your Housekeeping? N -  Some recent data might be hidden    Patient Care Team: Jon Billings, NP as PCP - General Anthonette Legato, MD  (Internal Medicine) Laneta Simmers as Physician Assistant (Urology) Hooten, Laurice Record, MD (Orthopedic Surgery) Rainey Pines, MD as Consulting Physician (Psychiatry) Oneta Rack, MD (Dermatology)  Indicate any recent Medical Services you may have received from other than [facility] providers  in the past year (date may be approximate).     Assessment:   This is a routine wellness examination for Luis Miller.  Hearing/Vision screen Vision Screening - Comments:: Regular eye exams, [facility]  Dietary issues and exercise activities discussed: Current Exercise Habits: Home exercise routine, Type of exercise: treadmill;strength training/weights, Time (Minutes): 45, Frequency (Times/Week): 2, Weekly Exercise (Minutes/Week): 90   Goals Addressed             This Visit's Progress    Patient Stated       12/01/2020, stay healthy       Depression Screen PHQ 2/9 Scores 12/01/2020 11/17/2020 11/30/2019 11/20/2018 04/24/2018 11/14/2017 04/05/2017  PHQ - 2 Score 0 0 0 0 0 0 0  PHQ- 9 Score - - - - 3 - -  Some encounter information is confidential and restricted. Go to Review Flowsheets activity to see all data.    Fall Risk Fall Risk  12/01/2020 11/17/2020 11/17/2020 11/30/2019 11/20/2018  Falls in the past year? 1 1 0 0 0  Comment tripped down stairs - - - -  Number falls in past yr: 0 0 0 - -  Injury with Fall? 1 1 0 - -  Comment laceration on head - - - -  Risk for fall due to : Medication side effect No Fall Risks No Fall Risks History of fall(s);Medication side effect -  Follow up Falls evaluation completed;Education provided;Falls prevention discussed Falls evaluation completed Falls evaluation completed Falls evaluation completed;Education provided;Falls prevention discussed -    FALL RISK PREVENTION PERTAINING TO THE HOME:  Any stairs in or around the home? No  If so, are there any without handrails?  N/a Home free of loose throw rugs in walkways, pet beds, electrical cords,  etc? Yes  Adequate lighting in your home to reduce risk of falls? Yes   ASSISTIVE DEVICES UTILIZED TO PREVENT FALLS:  Life alert? No  Use of a cane, walker or w/c? No  Grab bars in the bathroom? No  Shower chair or bench in shower? No  Elevated toilet seat or a handicapped toilet? Yes   TIMED UP AND GO:  Was the test performed? No .       Cognitive Function: MMSE - Mini Mental State Exam 02/23/2017  Orientation to time 5  Orientation to Place 5  Registration 3  Attention/ Calculation 4  Recall 2  Language- name 2 objects 2  Language- repeat 1  Language- follow 3 step command 3  Language- read & follow direction 1  Write a sentence 1  Copy design 1  Total score 28     6CIT Screen 12/01/2020 11/20/2018 11/14/2017 11/11/2016  What Year? 0 points 0 points 0 points 0 points  What month? 0 points 0 points 0 points 0 points  What time? 0 points 0 points 0 points 0 points  Count back from 20 0 points 0 points 0 points 0 points  Months in reverse 0 points 0 points 0 points 0 points  Repeat phrase 2 points 2 points 2 points 0 points  Total Score _0 0    Immunizations Immunization History  Administered Date(s) Administered   Fluad Quad(high Dose 65+) 11/20/2018, 02/18/2020   Influenza, High Dose Seasonal PF 01/02/2016, 01/12/2017, 12/22/2017   Influenza,inj,Quad PF,6+ Mos 05/13/2015   Influenza-Unspecified 02/27/2014   PFIZER(Purple Top)SARS-COV-2 Vaccination 05/17/2019, 06/07/2019, 03/31/2020   Pneumococcal Conjugate-13 03/20/2014   Pneumococcal-Unspecified 11/05/2004, 04/29/2009   Td 08/12/2011   Zoster, Live 10/07/2009  TDAP status: Up to date  Flu Vaccine status: Due, Education has been provided regarding the importance of this vaccine. Advised may receive this vaccine at local pharmacy or Health Dept. Aware to provide a copy of the vaccination record if obtained from local pharmacy or Health Dept. Verbalized acceptance and understanding.  Pneumococcal vaccine  status: Declined,  Education has been provided regarding the importance of this vaccine but patient still declined. Advised may receive this vaccine at local pharmacy or Health Dept. Aware to provide a copy of the vaccination record if obtained from local pharmacy or Health Dept. Verbalized acceptance and understanding.   Covid-19 vaccine status: Completed vaccines  Qualifies for Shingles Vaccine? Yes   Zostavax completed Yes   Shingrix Completed?: No.    Education has been provided regarding the importance of this vaccine. Patient has been advised to call insurance company to determine out of pocket expense if they have not yet received this vaccine. Advised may also receive vaccine at local pharmacy or Health Dept. Verbalized acceptance and understanding.  Screening Tests Health Maintenance  Topic Date Due   Zoster Vaccines- Shingrix (1 of 2) Never done   COVID-19 Vaccine (4 - Booster for Pfizer series) 06/23/2020   INFLUENZA VACCINE  10/20/2020   TETANUS/TDAP  08/11/2021   COLONOSCOPY (Pts 45-77yr Insurance coverage will need to be confirmed)  02/07/2023   Hepatitis C Screening  Completed   HPV VACCINES  Aged Out    Health Maintenance  Health Maintenance Due  Topic Date Due   Zoster Vaccines- Shingrix (1 of 2) Never done   COVID-19 Vaccine (4 - Booster for Pfizer series) 06/23/2020   INFLUENZA VACCINE  10/20/2020    Colorectal cancer screening: No longer required.   Lung Cancer Screening: (Low Dose CT Chest recommended if Age 10227-80years, 30 pack-year currently smoking OR have quit w/in 15years.) does not qualify.   Lung Cancer Screening Referral: no  Additional Screening:  Hepatitis C Screening: does qualify; Completed 08/02/2019  Vision Screening: Recommended annual ophthalmology exams for early detection of glaucoma and other disorders of the eye. Is the patient up to date with their annual eye exam?  Yes  Who is the provider or what is the name of the [facility] which  the patient [facility]ends annual [facility] exams[facility] provider, would they like to be referred to a provider to establish care? No .   Dental Screening: Recommended annual dental exams for proper oral hygiene  Community Resource Referral / Chronic Care Management: CRR required this visit?  No   CCM required this visit?  No      Plan:     I have personally reviewed and noted the following in the patient's chart:   Medical and social history Use of alcohol, tobacco or illicit drugs  Current medications and supplements including opioid prescriptions. Patient is not currently taking opioid prescriptions. Functional ability and status Nutritional status Physical activity Advanced directives List of other physicians Hospitalizations, surgeries, and ER visits in previous 12 months Vitals Screenings to include cognitive, depression, and falls Referrals and appointments  In addition, I have reviewed and discussed with patient certain preventive protocols, quality metrics, and best practice recommendations. A written personalized care plan for preventive services as well as general preventive health recommendations were provided to patient.     NKellie Simmering LPN   [id number]  Nurse Notes:

## 2020-12-02 ENCOUNTER — Other Ambulatory Visit: Payer: Self-pay

## 2020-12-02 ENCOUNTER — Encounter: Payer: Self-pay | Admitting: Psychiatry

## 2020-12-02 ENCOUNTER — Ambulatory Visit (INDEPENDENT_AMBULATORY_CARE_PROVIDER_SITE_OTHER): Payer: Medicare Other | Admitting: Psychiatry

## 2020-12-02 VITALS — BP 173/88 | HR 73 | Temp 98.7°F | Wt 195.8 lb

## 2020-12-02 DIAGNOSIS — F3176 Bipolar disorder, in full remission, most recent episode depressed: Secondary | ICD-10-CM | POA: Diagnosis not present

## 2020-12-02 DIAGNOSIS — G4701 Insomnia due to medical condition: Secondary | ICD-10-CM | POA: Diagnosis not present

## 2020-12-02 DIAGNOSIS — R030 Elevated blood-pressure reading, without diagnosis of hypertension: Secondary | ICD-10-CM

## 2020-12-02 NOTE — Progress Notes (Signed)
[facility] MD OP Progress Note  12/02/2020 4:47 PM Luis Miller.  MRN:  [id number]  Chief Complaint:  Chief Complaint   Follow-up; Depression    HPI: Luis Milleris a 75 year old Caucasian male, divorced, retired, lives in [place], has a history of bipolar disorder, chronic kidney disease stage IV, multiple other medical problems was evaluated in office today.  Patient had his last visit on 10/31/2020.  At that time patient was tapered off of the olanzapine.  He was started on Lamictal to replace the olanzapine as a mood stabilizer.  Patient today returns reporting his mood symptoms continues to be stable.  Denies any significant depression or anxiety symptoms.  Patient denies any manic or hypomanic symptoms.  He reports he continues to use CPAP at night.  He however wakes up 3-4 times to urinate.  He is however able to fall back asleep.  Patient reports he has noticed sleepiness during the day however that has been going on for a long time and he does not believe it is new.  He does use trazodone at night on a regular basis.  He does report feeling fatigued or tired likely due to his medical problems including stage IV kidney disease.  Patient with thyroid abnormalities does have endocrinology visit coming up.  Patient with elevated blood pressure reading today reports he has been monitoring his blood pressure at home and it has always been stable at home.  Patient to follow up with primary care provider as needed.  Patient denies any other concerns today.  Visit Diagnosis:    ICD-10-CM   1. Bipolar disorder, in full remission, most recent episode depressed ([place])  F31.76     2. Insomnia due to medical condition  G47.01    mood, OSA    3. Elevated blood pressure reading  R03.0       Past Psychiatric History: Reviewed past psychiatric history from progress note on 04/24/2020.  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[facility])    Erectile dysfunction    Gout    Hyperlipidemia    Hypertension    IBS (irritable bowel syndrome)    Impotence    Internal hemorrhoids    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[employer])    Paraphimosis    Peyronie disease    Secondary hyperparathyroidism of renal origin ([place])    Skin cancer    Testicular hypofunction    Urinary frequency    Urinary hesitancy    Wears hearing aid in both ears     Past Surgical History:  Procedure Laterality Date   arm fracture     ARTERY BIOPSY Left 07/04/2020   Procedure: BIOPSY TEMPORAL ARTERY;  Surgeon: Katha Cabal, MD;  Location: [facility];  Service: Vascular;  Laterality: Left;   BONE MARROW BIOPSY     CATARACT EXTRACTION W/PHACO Right 04/09/2020   Procedure: CATARACT EXTRACTION PHACO AND INTRAOCULAR LENS PLACEMENT (IOC) RIGHT EYHANCE TORIC 6.84 01:03.7 10.7%;  Surgeon: Leandrew Koyanagi, MD;  Location: [place];  Service: Ophthalmology;  Laterality: Right;  sleep apnea   CATARACT EXTRACTION W/PHACO Left 05/07/2020   Procedure: CATARACT EXTRACTION PHACO AND INTRAOCULAR LENS PLACEMENT (IOC) LEFT EYHANCE TORIC 2.93 00:58.7 5.0%;  Surgeon: Leandrew Koyanagi, MD;  Location: [place];  Service: Ophthalmology;  Laterality: Left;   COLONOSCOPY WITH PROPOFOL N/A 12/23/2014   Procedure: COLONOSCOPY WITH PROPOFOL;  Surgeon: Manya Silvas, MD;  Location: [facility];  Service: Endoscopy;  Laterality: N/A;   COLONOSCOPY WITH PROPOFOL N/A 02/06/2018   Procedure: COLONOSCOPY WITH PROPOFOL;  Surgeon: Manya Silvas, MD;  Location: [facility] ENDOSCOPY;  Service: Endoscopy;   Laterality: N/A;   KNEE ARTHROPLASTY Left 02/22/2018   Procedure: COMPUTER ASSISTED TOTAL KNEE ARTHROPLASTY;  Surgeon: Dereck Leep, MD;  Location: [facility] ORS;  Service: Orthopedics;  Laterality: Left;   MELANOMA EXCISION     PENILE PROSTHESIS IMPLANT     PENILE PROSTHESIS IMPLANT N/A 08/25/2015   Procedure: REPLACEMENT OF INFLATABLE PENILE PROSTHESIS COMPONENTS;  Surgeon: Cleon Gustin, MD;  Location: [facility];  Service: Urology;  Laterality: N/A;   SKIN CANCER EXCISION     nose   TONSILLECTOMY     VASECTOMY      Family Psychiatric History: Reviewed family psychiatric history from progress note on 04/24/2020  Family History:  Family History  Problem Relation Age of Onset   Depression Sister    Bipolar disorder Other    Prostate cancer Neg Hx    Kidney cancer Neg Hx    Bladder Cancer Neg Hx     Social History: Reviewed social history from progress note on 04/24/2020 Social History   Socioeconomic History   Marital status: Divorced    Spouse name: Not on file   Number of children: 2   Years of education: Not on file   Highest education level: High school graduate  Occupational History   Occupation: retired  Tobacco Use   Smoking status: Former    Types: Cigarettes    Quit date: 09/08/1972    Years since quitting: 48.2   Smokeless tobacco: Never  Vaping Use   Vaping Use: Never used  Substance and Sexual Activity   Alcohol use: Not Currently    Alcohol/week: 0.0 standard drinks    Comment: Occasional glass of wine   Drug use: No   Sexual activity: Not Currently    Partners: Female    Birth control/protection: None  Other Topics Concern   Not on file  Social History Narrative   Not on file   Social Determinants of Health   Financial Resource Strain: Low Risk    Difficulty of Paying Living Expenses: Not hard at all  Food Insecurity: No Food Insecurity   Worried About Charity fundraiser in the Last Year: Never true   [place] in the Last Year: Never true   Transportation Needs: No Transportation Needs   Lack of Transportation (Medical): No   Lack of Transportation (Non-Medical): No  Physical Activity: Insufficiently Active   Days of Exercise per Week: 2 days   Minutes of Exercise per Session: 40 min  Stress: No Stress Concern Present   Feeling of Stress : Not at all  Social Connections: Not on file    Allergies:  Allergies  Allergen Reactions   Mirtazapine Rash   Aspirin     Other reaction(s): Unknown Patient was instructed not to take aspirin but can not remember why   Atomoxetine Other (See Comments)    Urinary sx   Strattera [Atomoxetine Hcl] Other (See Comments)  Urinary sx   Xyzal [Levocetirizine Dihydrochloride] Rash    Patient reported on 11/18/54    Metabolic Disorder Labs: Lab Results  Component Value Date   HGBA1C 5.2 05/19/2020   Lab Results  Component Value Date   PROLACTIN 15.4 (H) 05/19/2020   Lab Results  Component Value Date   CHOL 145 11/17/2020   TRIG 131 11/17/2020   HDL 38 (L) 11/17/2020   CHOLHDL 3.8 11/17/2020   VLDL 50 (H) 10/03/2017   LDLCALC 84 11/17/2020   LDLCALC 155 (H) 05/19/2020   Lab Results  Component Value Date   TSH 0.392 (L) 11/17/2020   TSH 0.416 (L) 10/01/2020    Therapeutic Level Labs: Lab Results  Component Value Date   LITHIUM 0.70 08/06/2019   LITHIUM 0.78 08/05/2019   No results found for: VALPROATE No components found for:  CBMZ  Current Medications: Current Outpatient Medications  Medication Sig Dispense Refill   allopurinol (ZYLOPRIM) 100 MG tablet Take 100 mg by mouth daily.     amLODipine (NORVASC) 10 MG tablet Take 1 tablet (10 mg total) by mouth daily. 90 tablet 1   benazepril (LOTENSIN) 40 MG tablet Take 40 mg by mouth daily.      calcitRIOL (ROCALTROL) 0.25 MCG capsule Take 0.25 mcg by mouth daily.     FLUoxetine (PROZAC) 10 MG capsule Take 1 capsule (10 mg total) by mouth daily. 90 capsule 1   hydrALAZINE (APRESOLINE) 100 MG tablet Take 100 mg by  mouth 3 (three) times daily.     lamoTRIgine (LAMICTAL) 25 MG tablet Take 1 tablet (25 mg total) by mouth daily. 90 tablet 0   loperamide (IMODIUM) 2 MG capsule Take 2 mg by mouth daily as needed for diarrhea or loose stools.     Multiple Vitamin (MULTIVITAMIN) tablet Take 1 tablet by mouth daily.     rosuvastatin (CRESTOR) 10 MG tablet Take 1 tablet (10 mg total) by mouth daily. 90 tablet 1   traZODone (DESYREL) 100 MG tablet Take 1 tablet (100 mg total) by mouth at bedtime. 90 tablet 1   Vitamin D, Cholecalciferol, 25 MCG (1000 UT) TABS Take 1,000 tablets by mouth daily.      No current facility-administered medications for this visit.     Musculoskeletal: Strength & Muscle Tone: within normal limits Gait & Station: normal Patient leans: N/A  Psychiatric Specialty Exam: Review of Systems  Constitutional:  Positive for fatigue.  Psychiatric/Behavioral:         Does report sleepiness during the day - chronic   All other systems reviewed and are negative.  Blood pressure (!) 173/88, pulse 73, temperature 98.7 F (37.1 C), temperature source Temporal, weight 195 lb 12.8 oz (88.8 kg).Body mass index is 29.77 kg/m.  General Appearance: Casual  Eye Contact:  Fair  Speech:  Normal Rate  Volume:  Normal  Mood:  Euthymic  Affect:  Congruent  Thought Process:  Goal Directed and Descriptions of Associations: Intact  Orientation:  Full (Time, Place, and Person)  Thought Content: Logical   Suicidal Thoughts:  No  Homicidal Thoughts:  No  Memory:  Immediate;   Fair Recent;   Fair Remote;   Fair  Judgement:  Fair  Insight:  Fair  Psychomotor Activity:  Normal  Concentration:  Concentration: Fair and Attention Span: Fair  Recall:  [employer] of Knowledge: Fair  Language: Fair  Akathisia:  No  Handed:  Right  AIMS (if indicated): done  Assets:  Communication Skills Desire for Improvement Housing  Social Support Talents/Skills  ADL's:  Intact  Cognition: WNL  Sleep:  Fair    Screenings: [facility] Office Visit from 09/30/2020 in [facility] Total Score 0      GAD-7    [place] Office Visit from 10/31/2020 in [facility]  Total GAD-7 Score 0      Mini-Mental    [place] Office Visit from 02/23/2017 in [facility]  Total Score (max 30 points ) 28      PHQ2-9    [place] Visit from 12/02/2020 in [facility] from 12/01/2020 in [place] Visit from 11/17/2020 in [facility] [facility] Visit from 10/31/2020 in [facility] Office Visit from 09/30/2020 in [facility]  PHQ-2 Total Score 0 0 0 0 0      [place] Office Visit from 12/02/2020 in [place] [facility] Visit from 10/31/2020 in [place] Admission (Discharged) from 07/04/2020 in [facility] CATEGORY No Risk No Risk No Risk        Assessment and Plan: Luis Milleris a 75 year old male, with history of bipolar disorder, chronic kidney disease stage IV, multiple medical problems was evaluated in office today.  Patient is currently tolerating being off of the olanzapine.  Denies any worsening mood symptoms.  Discussed plan as noted below.  Plan Bipolar disorder in remission Continue lamotrigine 25 mg p.o. daily Trazodone 100 mg p.o. nightly Fluoxetine 10 mg p.o. daily AIMS - 0  Insomnia-stable Continue CPAP Patient with increased urination does have an impact on his sleep. Continue sleep hygiene techniques Advised to reduce to half of trazodone as needed if he has excessive sleepiness during the day. He could also try taking the lamotrigine earlier than bedtime. We will monitor closely  Elevated blood pressure reading-patient to  monitor his blood pressure at home.  Follow up with primary care provider as needed.  Patient to sign a release to obtain medical records from endocrinology-patient with abnormal TSH.  Follow-up in clinic in 2 months in person.  This note was generated in part or whole with voice recognition software. Voice recognition is usually quite accurate but there are transcription errors that can and very often do occur. I apologize for any typographical errors that were not detected and corrected.       Ursula Alert, MD 12/02/2020, 4:47 PM

## 2020-12-16 ENCOUNTER — Ambulatory Visit (INDEPENDENT_AMBULATORY_CARE_PROVIDER_SITE_OTHER): Payer: Medicare Other

## 2020-12-16 ENCOUNTER — Other Ambulatory Visit: Payer: Self-pay

## 2020-12-16 DIAGNOSIS — Z23 Encounter for immunization: Secondary | ICD-10-CM

## 2021-01-01 DIAGNOSIS — I10 Essential (primary) hypertension: Secondary | ICD-10-CM | POA: Diagnosis not present

## 2021-01-01 DIAGNOSIS — N2581 Secondary hyperparathyroidism of renal origin: Secondary | ICD-10-CM | POA: Diagnosis not present

## 2021-01-01 DIAGNOSIS — R809 Proteinuria, unspecified: Secondary | ICD-10-CM | POA: Diagnosis not present

## 2021-01-01 DIAGNOSIS — D631 Anemia in chronic kidney disease: Secondary | ICD-10-CM | POA: Diagnosis not present

## 2021-01-01 DIAGNOSIS — N185 Chronic kidney disease, stage 5: Secondary | ICD-10-CM | POA: Diagnosis not present

## 2021-01-20 DIAGNOSIS — E0590 Thyrotoxicosis, unspecified without thyrotoxic crisis or storm: Secondary | ICD-10-CM | POA: Diagnosis not present

## 2021-01-21 DIAGNOSIS — E0590 Thyrotoxicosis, unspecified without thyrotoxic crisis or storm: Secondary | ICD-10-CM | POA: Diagnosis not present

## 2021-01-27 ENCOUNTER — Encounter: Payer: Self-pay | Admitting: Nurse Practitioner

## 2021-01-27 ENCOUNTER — Other Ambulatory Visit: Payer: Self-pay

## 2021-01-27 ENCOUNTER — Ambulatory Visit (INDEPENDENT_AMBULATORY_CARE_PROVIDER_SITE_OTHER): Payer: Medicare Other | Admitting: Nurse Practitioner

## 2021-01-27 ENCOUNTER — Other Ambulatory Visit: Payer: Self-pay | Admitting: Nurse Practitioner

## 2021-01-27 VITALS — BP 155/79 | HR 75 | Temp 97.1°F | Ht 66.5 in | Wt 200.6 lb

## 2021-01-27 DIAGNOSIS — E0590 Thyrotoxicosis, unspecified without thyrotoxic crisis or storm: Secondary | ICD-10-CM | POA: Diagnosis not present

## 2021-01-27 DIAGNOSIS — I10 Essential (primary) hypertension: Secondary | ICD-10-CM

## 2021-01-27 DIAGNOSIS — E782 Mixed hyperlipidemia: Secondary | ICD-10-CM

## 2021-01-27 DIAGNOSIS — G4733 Obstructive sleep apnea (adult) (pediatric): Secondary | ICD-10-CM | POA: Diagnosis not present

## 2021-01-27 MED ORDER — ROSUVASTATIN CALCIUM 10 MG PO TABS: 10.0000 mg | ORAL_TABLET | Freq: Every day | ORAL | 1 refills | Status: DC

## 2021-01-27 MED ORDER — AMLODIPINE BESYLATE 10 MG PO TABS: 10.0000 mg | ORAL_TABLET | Freq: Every day | ORAL | 1 refills | Status: DC

## 2021-01-27 NOTE — Telephone Encounter (Signed)
Copied from [place] [id number]. Topic: Quick Communication - Rx Refill/Question >> Jan 27, 2021  9:16 AM Tessa Lerner A wrote: Medication: amLODipine (NORVASC) 10 MG tablet [[id number]]   rosuvastatin (CRESTOR) 10 MG tablet [[id number]]   Has the patient contacted their pharmacy? Yes.  The patient has been directed to contact their PCP (Agent: If no, request that the patient contact the pharmacy for the refill. If patient does not wish to contact the pharmacy document the reason why and proceed with request.) (Agent: If yes, when and what did the pharmacy advise?)  Preferred Pharmacy (with phone number or street name): [facility] [address] [place] [address] Phone: [phone] Fax: [phone] Hours: Not open 24 hours   Has the patient been seen for an appointment in the last year OR does the patient have an upcoming appointment? Yes.    Agent: Please be advised that RX refills may take up to 3 business days. We ask that you follow-up with your pharmacy.

## 2021-01-27 NOTE — Progress Notes (Signed)
BP (!) 155/79 (BP Location: Right Arm, Cuff Size: Normal)   Pulse 75   Temp (!) 97.1 F (36.2 C) (Oral)   Ht 5' 6.5" (1.689 m)   Wt 200 lb 9.6 oz (91 kg)   SpO2 98%   BMI 31.89 kg/m    Subjective:    Patient ID: Luis Coss., male    DOB: Nov 24, 1945, 75 y.o.   MRN: [id number]  HPI: Luis Tessler. is a 75 y.o. male  Chief Complaint  Patient presents with   Referral    Pt states he needs a referral to have a new sleep study done.    Patient states his machine is malfunctioning. He contacted the [facility] and they said he needs an updated sleep study because it has been 5 years since he has had one done.  He would like an order for a new sleep study.    Denies HA, CP, SOB, dizziness, palpitations, visual changes, and lower extremity swelling.    Relevant past medical, surgical, family and social history reviewed and updated as indicated. Interim medical history since our last visit reviewed. Allergies and medications reviewed and updated.  Review of Systems  Eyes:  Negative for visual disturbance.  Respiratory:  Negative for shortness of breath.   Cardiovascular:  Negative for chest pain and leg swelling.  Neurological:  Negative for light-headedness and headaches.   Per HPI unless specifically indicated above     Objective:    BP (!) 155/79 (BP Location: Right Arm, Cuff Size: Normal)   Pulse 75   Temp (!) 97.1 F (36.2 C) (Oral)   Ht 5' 6.5" (1.689 m)   Wt 200 lb 9.6 oz (91 kg)   SpO2 98%   BMI 31.89 kg/m   Wt Readings from Last 3 Encounters:  01/27/21 200 lb 9.6 oz (91 kg)  12/01/20 198 lb (89.8 kg)  11/17/20 199 lb (90.3 kg)    Physical Exam Vitals and nursing note reviewed.  Constitutional:      General: He is not in acute distress.    Appearance: Normal appearance. He is not ill-appearing, toxic-appearing or diaphoretic.  HENT:     Head: Normocephalic.     Right Ear: External ear normal.     Left Ear: External ear normal.      Nose: Nose normal. No congestion or rhinorrhea.     Mouth/Throat:     Mouth: Mucous membranes are moist.  Eyes:     General:        Right eye: No discharge.        Left eye: No discharge.     Extraocular Movements: Extraocular movements intact.     Conjunctiva/sclera: Conjunctivae normal.     Pupils: Pupils are equal, round, and reactive to light.  Cardiovascular:     Rate and Rhythm: Normal rate and regular rhythm.     Heart sounds: No murmur heard. Pulmonary:     Effort: Pulmonary effort is normal. No respiratory distress.     Breath sounds: Normal breath sounds. No wheezing, rhonchi or rales.  Abdominal:     General: Abdomen is flat. Bowel sounds are normal.  Musculoskeletal:     Cervical back: Normal range of motion and neck supple.  Skin:    General: Skin is warm and dry.     Capillary Refill: Capillary refill takes less than 2 seconds.  Neurological:     General: No focal deficit present.     Mental Status:  He is alert and oriented to person, place, and time.  Psychiatric:        Mood and Affect: Mood normal.        Behavior: Behavior normal.        Thought Content: Thought content normal.        Judgment: Judgment normal.    Results for orders placed or performed in visit on 11/17/20  Comp Met (CMET)  Result Value Ref Range   Glucose 103 (H) 65 - 99 mg/dL   BUN 40 (H) 8 - 27 mg/dL   Creatinine, Ser 4.04 (H) 0.76 - 1.27 mg/dL   eGFR 15 (L) >59 mL/min/1.73   BUN/Creatinine Ratio 10 10 - 24   Sodium 144 134 - 144 mmol/L   Potassium 3.6 3.5 - 5.2 mmol/L   Chloride 107 (H) 96 - 106 mmol/L   CO2 23 20 - 29 mmol/L   Calcium 9.5 8.6 - 10.2 mg/dL   Total Protein 6.4 6.0 - 8.5 g/dL   Albumin 4.3 3.7 - 4.7 g/dL   Globulin, Total 2.1 1.5 - 4.5 g/dL   Albumin/Globulin Ratio 2.0 1.2 - 2.2   Bilirubin Total 0.4 0.0 - 1.2 mg/dL   Alkaline Phosphatase 65 44 - 121 IU/L   AST 20 0 - 40 IU/L   ALT 12 0 - 44 IU/L  Lipid Profile  Result Value Ref Range   Cholesterol, Total  145 100 - 199 mg/dL   Triglycerides 131 0 - 149 mg/dL   HDL 38 (L) >39 mg/dL   VLDL Cholesterol Cal 23 5 - 40 mg/dL   LDL Chol Calc (NIH) 84 0 - 99 mg/dL   Chol/HDL Ratio 3.8 0.0 - 5.0 ratio  T4, free  Result Value Ref Range   Free T4 1.27 0.82 - 1.77 ng/dL  TSH  Result Value Ref Range   TSH 0.392 (L) 0.450 - 4.500 uIU/mL      Assessment & Plan:   Problem List Items Addressed This Visit   None   Follow up plan: No follow-ups on file.

## 2021-01-29 ENCOUNTER — Ambulatory Visit: Payer: Medicare Other | Admitting: Psychiatry

## 2021-01-31 ENCOUNTER — Other Ambulatory Visit: Payer: Self-pay | Admitting: Nurse Practitioner

## 2021-01-31 DIAGNOSIS — I10 Essential (primary) hypertension: Secondary | ICD-10-CM

## 2021-01-31 NOTE — Telephone Encounter (Signed)
As noted OV 11/17/20, Crestor increased from 5 mg to 10 mg, will refuse the request for 5 mg.   Other     Hyperlipidemia - Primary      Chronic.  Controlled.  Continue with current medication regimen.  Increased dose of Crestor from 66m to 157m  Goal is to get patient to Crestor 2083m Labs ordered today.  Return to clinic in 6 months for reevaluation.  Call sooner if concerns arise.       Requested Prescriptions  Refused Prescriptions Disp Refills   rosuvastatin (CRESTOR) 5 MG tablet [Pharmacy Med Name: ROSUVASTATIN TAB 5MG] 90 tablet 0    Sig: TAKE 1 TABLET DAILY     Cardiovascular:  Antilipid - Statins Failed - 01/31/2021  8:16 AM      Failed - HDL in normal range and within 360 days    HDL  Date Value Ref Range Status  11/17/2020 38 (L) >39 mg/dL Final          Passed - Total Cholesterol in normal range and within 360 days    Cholesterol, Total  Date Value Ref Range Status  11/17/2020 145 100 - 199 mg/dL Final   Cholesterol Piccolo, Waived  Date Value Ref Range Status  10/03/2017 180 <200 mg/dL Final    Comment:                            Desirable                <200                         Borderline High      200- 239                         High                     >239           Passed - LDL in normal range and within 360 days    LDL Chol Calc (NIH)  Date Value Ref Range Status  11/17/2020 84 0 - 99 mg/dL Final          Passed - Triglycerides in normal range and within 360 days    Triglycerides  Date Value Ref Range Status  11/17/2020 131 0 - 149 mg/dL Final   Triglycerides Piccolo,Waived  Date Value Ref Range Status  10/03/2017 252 (H) <150 mg/dL Final    Comment:                            Normal                   <150                         Borderline High     150 - 199                         High                200 - 499                         Very High                >  27           Passed - Patient is not pregnant      Passed - Valid  encounter within last 12 months    Recent Outpatient Visits           4 days ago OSA (obstructive sleep apnea)   [place], NP   2 months ago Mixed hyperlipidemia   [place], NP   8 months ago Diabetes insipidus [facility])   [facility] Jon Billings, NP   11 months ago Need for immunization against influenza   [place], [place], DO   1 year ago Suspected COVID-19 virus infection   [facility] Eulogio Bear, NP       Future Appointments             In 2 months [place], Gordan Payment [place]   In 3 months Jon Billings, NP [facility], [place]   In 10 months  [employer], [place]

## 2021-02-03 ENCOUNTER — Other Ambulatory Visit: Payer: Self-pay | Admitting: Psychiatry

## 2021-02-03 ENCOUNTER — Telehealth: Payer: Self-pay

## 2021-02-03 DIAGNOSIS — F3176 Bipolar disorder, in full remission, most recent episode depressed: Secondary | ICD-10-CM

## 2021-02-03 MED ORDER — LAMOTRIGINE 25 MG PO TABS: 25.0000 mg | ORAL_TABLET | Freq: Every day | ORAL | 0 refills | Status: DC

## 2021-02-03 NOTE — Telephone Encounter (Signed)
pt called he is completely out of the lamtrigine

## 2021-02-03 NOTE — Telephone Encounter (Signed)
Patient missed his appointment recently.  I have sent lamotrigine to his pharmacy.  Please let patient know to schedule an appointment soon for follow-up.

## 2021-02-04 NOTE — Telephone Encounter (Signed)
Pt given info and made a follow up appt.

## 2021-02-05 DIAGNOSIS — N185 Chronic kidney disease, stage 5: Secondary | ICD-10-CM | POA: Diagnosis not present

## 2021-02-05 DIAGNOSIS — N2581 Secondary hyperparathyroidism of renal origin: Secondary | ICD-10-CM | POA: Diagnosis not present

## 2021-02-05 DIAGNOSIS — D631 Anemia in chronic kidney disease: Secondary | ICD-10-CM | POA: Diagnosis not present

## 2021-02-05 DIAGNOSIS — R809 Proteinuria, unspecified: Secondary | ICD-10-CM | POA: Diagnosis not present

## 2021-02-06 DIAGNOSIS — H47012 Ischemic optic neuropathy, left eye: Secondary | ICD-10-CM | POA: Diagnosis not present

## 2021-02-09 DIAGNOSIS — Z961 Presence of intraocular lens: Secondary | ICD-10-CM | POA: Diagnosis not present

## 2021-02-16 ENCOUNTER — Ambulatory Visit: Payer: Medicare Other | Admitting: Psychiatry

## 2021-03-02 ENCOUNTER — Other Ambulatory Visit: Payer: Self-pay | Admitting: Internal Medicine

## 2021-03-02 DIAGNOSIS — E0590 Thyrotoxicosis, unspecified without thyrotoxic crisis or storm: Secondary | ICD-10-CM

## 2021-03-05 ENCOUNTER — Encounter: Payer: Self-pay | Admitting: Psychiatry

## 2021-03-05 ENCOUNTER — Other Ambulatory Visit: Payer: Self-pay

## 2021-03-05 ENCOUNTER — Ambulatory Visit (INDEPENDENT_AMBULATORY_CARE_PROVIDER_SITE_OTHER): Payer: Medicare Other | Admitting: Psychiatry

## 2021-03-05 VITALS — BP 168/72 | HR 93 | Temp 98.5°F | Wt 193.4 lb

## 2021-03-05 DIAGNOSIS — F3176 Bipolar disorder, in full remission, most recent episode depressed: Secondary | ICD-10-CM

## 2021-03-05 DIAGNOSIS — R030 Elevated blood-pressure reading, without diagnosis of hypertension: Secondary | ICD-10-CM | POA: Diagnosis not present

## 2021-03-05 DIAGNOSIS — G4701 Insomnia due to medical condition: Secondary | ICD-10-CM | POA: Diagnosis not present

## 2021-03-05 MED ORDER — TRAZODONE HCL 100 MG PO TABS: 100.0000 mg | ORAL_TABLET | Freq: Every day | ORAL | 1 refills | Status: DC

## 2021-03-05 MED ORDER — FLUOXETINE HCL 10 MG PO CAPS: 10.0000 mg | ORAL_CAPSULE | Freq: Every day | ORAL | 1 refills | Status: DC

## 2021-03-05 NOTE — Progress Notes (Signed)
[place] [facility] MD OP Progress Note  03/05/2021 4:38 PM Luis Miller.  MRN:  [id number]  Chief Complaint:  Chief Complaint   Follow-up; Anxiety; Depression    HPI: Luis Milleris a 75 year old Caucasian male, divorced, retired, lives in [place], has a history of bipolar disorder, chronic kidney disease stage IV, multiple other medical problems was evaluated in office today.  Patient today reports he is currently doing well with regards to his mood.  Continues to be compliant on the Lamictal.  Denies side effects.  Patient reports sleep is good.  He does have frequent urination at night however is able to fall back asleep quickly.  Patient looks forward to spending his Christmas with his family.  Patient reports he is interested in losing weight, may have lost a few pounds recently.  Patient reports he is interested in getting back in the gym as well as watching his diet.  Patient denies any suicidality, homicidality or perceptual disturbances.  Patient with recent abnormal thyroid labs-currently under the care of endocrinology-is currently scheduled for thyroid uptake due to recent diagnosis of subclinical hyperthyroidism.  I have reviewed notes per endocrinology.  Visit Diagnosis:    ICD-10-CM   1. Bipolar disorder, in full remission, most recent episode depressed ([place])  F31.76 traZODone (DESYREL) 100 MG tablet    FLUoxetine (PROZAC) 10 MG capsule    2. Insomnia due to medical condition  G47.01    mood, OSA    3. Elevated blood pressure reading  R03.0       Past Psychiatric History: Reviewed past psychiatric history from progress note on 04/24/2020.  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[facility])    Erectile dysfunction    Gout    Hyperlipidemia    Hypertension    IBS (irritable bowel syndrome)    Impotence    Internal hemorrhoids    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[place])    Paraphimosis    Peyronie disease    Secondary hyperparathyroidism of renal origin ([place])    Skin cancer    Testicular hypofunction    Urinary frequency    Urinary hesitancy    Wears hearing aid in both ears     Past Surgical History:  Procedure Laterality Date   arm fracture     ARTERY BIOPSY Left 07/04/2020   Procedure: BIOPSY TEMPORAL ARTERY;  Surgeon: Katha Cabal, MD;  Location: [facility];  Service: Vascular;  Laterality: Left;   BONE MARROW BIOPSY     CATARACT EXTRACTION W/PHACO Right 04/09/2020   Procedure: CATARACT EXTRACTION PHACO AND INTRAOCULAR LENS PLACEMENT (IOC) RIGHT EYHANCE TORIC 6.84 01:03.7 10.7%;  Surgeon: Leandrew Koyanagi, MD;  Location: [place];  Service: Ophthalmology;  Laterality: Right;  sleep apnea   CATARACT EXTRACTION W/PHACO Left 05/07/2020   Procedure: CATARACT EXTRACTION PHACO AND INTRAOCULAR LENS PLACEMENT (IOC) LEFT EYHANCE TORIC 2.93 00:58.7 5.0%;  Surgeon: Leandrew Koyanagi, MD;  Location: [place];  Service: Ophthalmology;  Laterality: Left;   COLONOSCOPY WITH PROPOFOL N/A 12/23/2014   Procedure: COLONOSCOPY WITH PROPOFOL;  Surgeon: Manya Silvas, MD;  Location: [facility] ENDOSCOPY;  Service: Endoscopy;  Laterality: N/A;   COLONOSCOPY WITH PROPOFOL N/A 02/06/2018   Procedure: COLONOSCOPY WITH PROPOFOL;  Surgeon: Manya Silvas, MD;  Location: [facility] ENDOSCOPY;  Service: Endoscopy;  Laterality: N/A;   KNEE ARTHROPLASTY Left 02/22/2018   Procedure: COMPUTER ASSISTED TOTAL KNEE ARTHROPLASTY;  Surgeon: Dereck Leep, MD;  Location: [facility] ORS;  Service: Orthopedics;  Laterality: Left;   MELANOMA EXCISION     PENILE PROSTHESIS  IMPLANT     PENILE PROSTHESIS IMPLANT N/A 08/25/2015   Procedure: REPLACEMENT OF INFLATABLE PENILE PROSTHESIS COMPONENTS;  Surgeon: Cleon Gustin, MD;  Location: [facility];  Service: Urology;  Laterality: N/A;   SKIN CANCER EXCISION     nose   TONSILLECTOMY     VASECTOMY      Family Psychiatric History: Reviewed family psychiatric history from progress note on 04/24/2020  Family History:  Family History  Problem Relation Age of Onset   Depression Sister    Bipolar disorder Other    Prostate cancer Neg Hx    Kidney cancer Neg Hx    Bladder Cancer Neg Hx     Social History: Reviewed social history from progress note on 04/24/2020 Social History   Socioeconomic History   Marital status: Divorced    Spouse name: Not on file   Number of children: 2   Years of education: Not on file   Highest education level: High school graduate  Occupational History   Occupation: retired  Tobacco Use   Smoking status: Former    Types: Cigarettes    Quit date: 09/08/1972    Years since quitting: 48.5   Smokeless tobacco: Never  Vaping Use   Vaping Use: Never used  Substance and Sexual Activity   Alcohol use: Not Currently    Alcohol/week: 0.0 standard drinks    Comment: Occasional glass of wine   Drug use: No   Sexual activity: Not Currently    Partners: Female    Birth control/protection: None  Other Topics Concern   Not on file  Social History Narrative   Not on file   Social Determinants of Health   Financial Resource Strain: Low Risk    Difficulty of Paying Living Expenses: Not hard at all  Food Insecurity: No Food Insecurity   Worried About Charity fundraiser in the Last Year: Never true   [place] in the Last Year: Never true  Transportation Needs: No Transportation Needs   Lack of Transportation (Medical): No   Lack of Transportation (Non-Medical): No  Physical Activity: Insufficiently Active   Days of Exercise per Week: 2 days   Minutes of Exercise per Session:  40 min  Stress: No Stress Concern Present   Feeling of Stress : Not at all  Social Connections: Not on file    Allergies:  Allergies  Allergen Reactions   Mirtazapine Rash   Aspirin     Other reaction(s): Unknown Patient was instructed not to take aspirin but can not remember why   Atomoxetine Other (See Comments)    Urinary sx   Strattera [Atomoxetine Hcl] Other (See Comments)    Urinary sx   Xyzal [Levocetirizine Dihydrochloride] Rash    Patient reported on 5/63/87    Metabolic Disorder Labs: Lab Results  Component Value Date   HGBA1C 5.2 05/19/2020   Lab Results  Component Value Date   PROLACTIN 15.4 (H) 05/19/2020   Lab Results  Component Value Date  CHOL 145 11/17/2020   TRIG 131 11/17/2020   HDL 38 (L) 11/17/2020   CHOLHDL 3.8 11/17/2020   VLDL 50 (H) 10/03/2017   LDLCALC 84 11/17/2020   LDLCALC 155 (H) 05/19/2020   Lab Results  Component Value Date   TSH 0.392 (L) 11/17/2020   TSH 0.416 (L) 10/01/2020    Therapeutic Level Labs: Lab Results  Component Value Date   LITHIUM 0.70 08/06/2019   LITHIUM 0.78 08/05/2019   No results found for: VALPROATE No components found for:  CBMZ  Current Medications: Current Outpatient Medications  Medication Sig Dispense Refill   allopurinol (ZYLOPRIM) 100 MG tablet Take 100 mg by mouth daily.     amLODipine (NORVASC) 10 MG tablet Take 1 tablet (10 mg total) by mouth daily. 90 tablet 1   benazepril (LOTENSIN) 40 MG tablet Take 40 mg by mouth daily.      calcitRIOL (ROCALTROL) 0.25 MCG capsule Take 0.25 mcg by mouth daily.     hydrALAZINE (APRESOLINE) 100 MG tablet Take 100 mg by mouth 3 (three) times daily.     lamoTRIgine (LAMICTAL) 25 MG tablet Take 1 tablet (25 mg total) by mouth daily. 90 tablet 0   loperamide (IMODIUM) 2 MG capsule Take 2 mg by mouth daily as needed for diarrhea or loose stools.     Multiple Vitamin (MULTIVITAMIN) tablet Take 1 tablet by mouth daily.     rosuvastatin (CRESTOR) 10 MG tablet  Take 1 tablet (10 mg total) by mouth daily. 90 tablet 1   Vitamin D, Cholecalciferol, 25 MCG (1000 UT) TABS Take 1,000 tablets by mouth daily.      FLUoxetine (PROZAC) 10 MG capsule Take 1 capsule (10 mg total) by mouth daily. 90 capsule 1   traZODone (DESYREL) 100 MG tablet Take 1 tablet (100 mg total) by mouth at bedtime. 90 tablet 1   No current facility-administered medications for this visit.     Musculoskeletal: Strength & Muscle Tone: within normal limits Gait & Station: normal Patient leans: N/A  Psychiatric Specialty Exam: Review of Systems  Psychiatric/Behavioral:  Negative for agitation, behavioral problems, confusion, decreased concentration, dysphoric mood, hallucinations, self-injury, sleep disturbance and suicidal ideas. The patient is not nervous/anxious and is not hyperactive.   All other systems reviewed and are negative.  Blood pressure (!) 168/72, pulse 93, temperature 98.5 F (36.9 C), temperature source Temporal, weight 193 lb 6.4 oz (87.7 kg).Body mass index is 30.75 kg/m.  General Appearance: Casual  Eye Contact:  Fair  Speech:  Normal Rate  Volume:  Normal  Mood:  Euthymic  Affect:  Congruent  Thought Process:  Goal Directed and Descriptions of Associations: Intact  Orientation:  Full (Time, Place, and Person)  Thought Content: Logical   Suicidal Thoughts:  No  Homicidal Thoughts:  No  Memory:  Immediate;   Fair Recent;   Fair Remote;   Fair  Judgement:  Fair  Insight:  Fair  Psychomotor Activity:  Normal  Concentration:  Concentration: Fair and Attention Span: Fair  Recall:  [employer] of Knowledge: Fair  Language: Fair  Akathisia:  No  Handed:  Right  AIMS (if indicated): done, 0  Assets:  Communication Skills Desire for Improvement Housing Social Support  ADL's:  Intact  Cognition: WNL  Sleep:  Fair   Screenings: [place] Office Visit from 09/30/2020 in [place] Total Score 0       GAD-7    [place] Office Visit  from 10/31/2020 in [place]  Total GAD-7 Score [place] Office Visit from 02/23/2017 in [facility]  Total Score (max 30 points ) 28      PHQ2-9    [place] Visit from 03/05/2021 in [place] Office Visit from 01/27/2021 in [place] Visit from 12/02/2020 in [facility] from 12/01/2020 in [place] Visit from 11/17/2020 in [place]  PHQ-2 Total Score 0 0 0 0 0  PHQ-9 Total Score -- 0 -- -- --      [place] Office Visit from 12/02/2020 in [place] Office Visit from 10/31/2020 in [place] Admission (Discharged) from 07/04/2020 in [place] CATEGORY No Risk No Risk No Risk        Assessment and Plan: Luis Milleris a 75 year old male with history of bipolar disorder, chronic kidney disease stage IV, multiple medical problems including recent thyroid abnormalities was evaluated in office today.  Patient is currently stable with regards to his mood.  Plan as noted below.  Plan Bipolar disorder in remission Lamotrigine 25 mg p.o. daily Trazodone 100 mg p.o. nightly Fluoxetine 10 mg p.o. daily  Insomnia-stable Continue CPAP Continued sleep hygiene techniques Patient with frequent urination however reports sleep is overall good.  Elevated blood pressure reading-unstable Patient to follow up with primary care provider.  Patient to continue follow up with endocrinology for subclinical hyperthyroidism.  Follow-up in clinic in 3 months or sooner if needed.  This note was generated in part or whole with voice recognition software. Voice recognition is usually quite accurate but there are transcription  errors that can and very often do occur. I apologize for any typographical errors that were not detected and corrected.     Ursula Alert, MD 03/06/2021, 1:36 PM

## 2021-03-19 DIAGNOSIS — R809 Proteinuria, unspecified: Secondary | ICD-10-CM | POA: Diagnosis not present

## 2021-03-19 DIAGNOSIS — I10 Essential (primary) hypertension: Secondary | ICD-10-CM | POA: Diagnosis not present

## 2021-03-19 DIAGNOSIS — N185 Chronic kidney disease, stage 5: Secondary | ICD-10-CM | POA: Diagnosis not present

## 2021-03-19 DIAGNOSIS — N2581 Secondary hyperparathyroidism of renal origin: Secondary | ICD-10-CM | POA: Diagnosis not present

## 2021-03-19 DIAGNOSIS — D631 Anemia in chronic kidney disease: Secondary | ICD-10-CM | POA: Diagnosis not present

## 2021-03-30 ENCOUNTER — Encounter
Admission: RE | Admit: 2021-03-30 | Discharge: 2021-03-30 | Disposition: A | Discharge status: Home / Self Care | Payer: Medicare HMO | Source: Ambulatory Visit | Attending: Internal Medicine | Admitting: Internal Medicine

## 2021-03-30 ENCOUNTER — Other Ambulatory Visit: Payer: Self-pay

## 2021-03-30 DIAGNOSIS — E0590 Thyrotoxicosis, unspecified without thyrotoxic crisis or storm: Secondary | ICD-10-CM | POA: Diagnosis not present

## 2021-03-30 MED ORDER — SODIUM IODIDE I-123 7.4 MBQ CAPS
292.0000 | ORAL_CAPSULE | Freq: Once | ORAL | Status: AC
  Administered 2021-03-30: 292 via ORAL

## 2021-03-31 ENCOUNTER — Other Ambulatory Visit: Payer: Self-pay | Admitting: Internal Medicine

## 2021-03-31 ENCOUNTER — Encounter
Admission: RE | Admit: 2021-03-31 | Discharge: 2021-03-31 | Disposition: A | Discharge status: Home / Self Care | Payer: Medicare HMO | Source: Ambulatory Visit | Attending: Internal Medicine | Admitting: Internal Medicine

## 2021-03-31 DIAGNOSIS — E0590 Thyrotoxicosis, unspecified without thyrotoxic crisis or storm: Secondary | ICD-10-CM

## 2021-03-31 DIAGNOSIS — E049 Nontoxic goiter, unspecified: Secondary | ICD-10-CM | POA: Diagnosis not present

## 2021-04-01 DIAGNOSIS — N185 Chronic kidney disease, stage 5: Secondary | ICD-10-CM | POA: Diagnosis not present

## 2021-04-01 DIAGNOSIS — I10 Essential (primary) hypertension: Secondary | ICD-10-CM | POA: Diagnosis not present

## 2021-04-06 DIAGNOSIS — H47012 Ischemic optic neuropathy, left eye: Secondary | ICD-10-CM | POA: Diagnosis not present

## 2021-04-07 ENCOUNTER — Other Ambulatory Visit: Payer: Self-pay

## 2021-04-07 ENCOUNTER — Other Ambulatory Visit
Admission: RE | Admit: 2021-04-07 | Discharge: 2021-04-07 | Disposition: A | Discharge status: Home / Self Care | Payer: Medicare HMO | Source: Ambulatory Visit | Attending: Nurse Practitioner | Admitting: Nurse Practitioner

## 2021-04-07 DIAGNOSIS — Z01812 Encounter for preprocedural laboratory examination: Secondary | ICD-10-CM | POA: Insufficient documentation

## 2021-04-07 DIAGNOSIS — Z20822 Contact with and (suspected) exposure to covid-19: Secondary | ICD-10-CM | POA: Insufficient documentation

## 2021-04-07 LAB — SARS CORONAVIRUS 2 (TAT 6-24 HRS): SARS Coronavirus 2: NEGATIVE

## 2021-04-09 ENCOUNTER — Ambulatory Visit: Payer: Medicare HMO | Attending: Neurology

## 2021-04-09 DIAGNOSIS — G4733 Obstructive sleep apnea (adult) (pediatric): Secondary | ICD-10-CM | POA: Diagnosis not present

## 2021-04-10 ENCOUNTER — Other Ambulatory Visit: Payer: Self-pay

## 2021-04-15 NOTE — Progress Notes (Addendum)
03/30/2019 10:47 AM   Luis Miller [birth year] [id number]  Referring provider: Jon Billings, NP [address] [place],  [place] [address]  Chief Complaint  Patient presents with   Benign Prostatic Hypertrophy    Urological history 1. Elevated PSA - negative biopsy in 2002 for PSA of 4.03 - PSA trend Component     Latest Ref Rng & Units 06/03/2015 11/11/2015 05/31/2016 12/06/2016  Prostate Specific Ag, Serum     0.0 - 4.0 ng/mL 5.5 (H) 6.4 (H) 6.4 (H) 5.6 (H)   Component     Latest Ref Rng & Units 04/05/2017 06/13/2017 12/20/2017 06/20/2018  Prostate Specific Ag, Serum     0.0 - 4.0 ng/mL 7.1 (H) 6.4 (H) 5.7 (H) 6.6 (H)   Component     Latest Ref Rng & Units 09/26/2018 03/27/2019 09/26/2019  Prostate Specific Ag, Serum     0.0 - 4.0 ng/mL 5.8 (H) 5.9 (H) 4.6 (H)   Component     Latest Ref Rng & Units 10/02/2020  Prostate Specific Ag, Serum     0.0 - 4.0 ng/mL 5.8 (H)    2. ED - IPP placement for a malfunction IPP on 08/25/2015  3. BPH with LU TS - I PSS: 19/5  4. High Risk Hematuria - Former smoker - work up in 2021 with RUS , non-contrast CT (due to CKD), cysto and cytology noted bilateral simple renal cysts and prominent lateral lobe enlargement with hypervascularity/friability prostate with elevated bladder neck -No gross hematuria -UA 03/2021 negative for micro heme  Chief Complaint  Patient presents with   Benign Prostatic Hypertrophy     HPI: Luis Miller. Is a 76 y.o. male who presents today for 6 month follow up.   He is having urinary frequency secondary to his increased water intake in order to preserve kidney function.    Patient denies any modifying or aggravating factors.  Patient denies any gross hematuria, dysuria or suprapubic/flank pain.  Patient denies any fevers, chills, nausea or vomiting.    UA negative.      IPSS     Row Name 04/16/21 1000         International Prostate Symptom Score   How often have you had the sensation  of not emptying your bladder? Almost always     How often have you had to urinate less than every two hours? About half the time     How often have you found you stopped and started again several times when you urinated? About half the time     How often have you found it difficult to postpone urination? More than half the time     How often have you had a weak urinary stream? Not at All     How often have you had to strain to start urination? Less than 1 in 5 times     How many times did you typically get up at night to urinate? 3 Times     Total IPSS Score 19       Quality of Life due to urinary symptoms   If you were to spend the rest of your life with your urinary condition just the way it is now how would you feel about that? 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[place])    Paraphimosis    Peyronie disease    Secondary hyperparathyroidism of renal origin ([place])    Skin cancer    Testicular hypofunction    Urinary frequency    Urinary hesitancy    Wears hearing aid in both ears     Surgical History: Past Surgical History:  Procedure Laterality Date   arm fracture     ARTERY BIOPSY Left 07/04/2020   Procedure: BIOPSY TEMPORAL ARTERY;  Surgeon: Katha Cabal, MD;  Location: [facility];  Service: Vascular;  Laterality: Left;   BONE MARROW BIOPSY     CATARACT EXTRACTION W/PHACO Right 04/09/2020   Procedure: CATARACT EXTRACTION PHACO AND  INTRAOCULAR LENS PLACEMENT (IOC) RIGHT EYHANCE TORIC 6.84 01:03.7 10.7%;  Surgeon: Leandrew Koyanagi, MD;  Location: [place];  Service: Ophthalmology;  Laterality: Right;  sleep apnea   CATARACT EXTRACTION W/PHACO Left 05/07/2020   Procedure: CATARACT EXTRACTION PHACO AND INTRAOCULAR LENS PLACEMENT (IOC) LEFT EYHANCE TORIC 2.93 00:58.7 5.0%;  Surgeon: Leandrew Koyanagi, MD;  Location: [facility];  Service: Ophthalmology;  Laterality: Left;   COLONOSCOPY WITH PROPOFOL N/A 12/23/2014   Procedure: COLONOSCOPY WITH PROPOFOL;  Surgeon: Manya Silvas, MD;  Location: [facility] ENDOSCOPY;  Service: Endoscopy;  Laterality: N/A;   COLONOSCOPY WITH PROPOFOL N/A 02/06/2018   Procedure: COLONOSCOPY WITH PROPOFOL;  Surgeon: Manya Silvas, MD;  Location: [facility] ENDOSCOPY;  Service: Endoscopy;  Laterality: N/A;   KNEE ARTHROPLASTY Left 02/22/2018   Procedure: COMPUTER ASSISTED TOTAL KNEE ARTHROPLASTY;  Surgeon: Dereck Leep, MD;  Location: [facility];  Service: Orthopedics;  Laterality: Left;   MELANOMA EXCISION     PENILE PROSTHESIS IMPLANT     PENILE PROSTHESIS IMPLANT N/A 08/25/2015   Procedure: REPLACEMENT OF INFLATABLE PENILE PROSTHESIS COMPONENTS;  Surgeon: Cleon Gustin, MD;  [facility];  Service: Urology;  Laterality: N/A;   SKIN CANCER EXCISION     nose   TONSILLECTOMY     VASECTOMY      Home Medications:  Allergies as of 04/16/2021       Reactions   Mirtazapine Rash   Aspirin    Other reaction(s): Unknown Patient was instructed not to take aspirin but can not remember why   Atomoxetine Other (See Comments)   Urinary sx   Strattera [atomoxetine Hcl] Other (See Comments)   Urinary sx   Xyzal [levocetirizine Dihydrochloride] Rash   Patient reported on 11/02/17        Medication List        Accurate as of April 16, 2021 10:47 AM. If you have any questions, ask your nurse or doctor.          allopurinol 100 MG tablet Commonly known as:  ZYLOPRIM Take 100 mg by mouth daily.   amLODipine 10 MG tablet Commonly known as: NORVASC Take 1 tablet (10 mg total) by mouth daily.   benazepril 40 MG tablet Commonly known as: LOTENSIN Take 40 mg  by mouth daily.   calcitRIOL 0.25 MCG capsule Commonly known as: ROCALTROL Take 0.25 mcg by mouth daily.   FLUoxetine 10 MG capsule Commonly known as: PROZAC Take 1 capsule (10 mg total) by mouth daily.   hydrALAZINE 100 MG tablet Commonly known as: APRESOLINE Take 100 mg by mouth 3 (three) times daily.   lamoTRIgine 25 MG tablet Commonly known as: LaMICtal Take 1 tablet (25 mg total) by mouth daily.   loperamide 2 MG capsule Commonly known as: IMODIUM Take 2 mg by mouth daily as needed for diarrhea or loose stools.   multivitamin tablet Take 1 tablet by mouth daily.   rosuvastatin 10 MG tablet Commonly known as: Crestor Take 1 tablet (10 mg total) by mouth daily.   traZODone 100 MG tablet Commonly known as: DESYREL Take 1 tablet (100 mg total) by mouth at bedtime.   Vitamin D (Cholecalciferol) 25 MCG (1000 UT) Tabs Take 1,000 tablets by mouth daily.        Allergies:  Allergies  Allergen Reactions   Mirtazapine Rash   Aspirin     Other reaction(s): Unknown Patient was instructed not to take aspirin but can not remember why   Atomoxetine Other (See Comments)    Urinary sx   Strattera [Atomoxetine Hcl] Other (See Comments)    Urinary sx   Xyzal [Levocetirizine Dihydrochloride] Rash    Patient reported on 11/02/17    Family History: Family History  Problem Relation Age of Onset   Depression Sister    Bipolar disorder Other    Prostate cancer Neg Hx    Kidney cancer Neg Hx    Bladder Cancer Neg Hx     Social History:  reports that he quit smoking about 48 years ago. His smoking use included cigarettes. He has never used smokeless tobacco. He reports that he does not currently use alcohol. He reports that he does not use drugs.  ROS: For pertinent  review of systems please refer to history of present illness  Physical Exam: BP 126/74    Pulse 88    Ht 5' 6.5" (1.689 m)    Wt 193 lb (87.5 kg)    BMI 30.68 kg/m   Constitutional:  Well nourished. Alert and oriented, No acute distress. HEENT: [place] AT, mask in place.  Trachea midline Cardiovascular: No clubbing, cyanosis, or edema. Respiratory: Normal respiratory effort, no increased work of breathing. GU: No CVA tenderness.  No bladder fullness or masses.  Patient with uncircumcised phallus. Foreskin easily retracted  Urethral meatus is patent.  No penile discharge. No penile lesions or rashes. Scrotum without lesions, cysts, rashes and/or edema.  Testicles are located scrotally bilaterally. No masses are appreciated in the testicles. Left and right epididymis are normal.  IPP cylinders and pump are in place.  No signs of erosion. Rectal: Patient with  normal sphincter tone. Anus and perineum without scarring or rashes. No rectal masses are appreciated. Prostate is approximately 55 grams, could only palpate apex and midportion of the gland, no nodules are appreciated. Seminal vesicles could not be palpated Neurologic: Grossly intact, no focal deficits, moving all 4 extremities. Psychiatric: Normal mood and affect.   Laboratory Data:  Component     Latest Ref Rng & Units 11/17/2020  Glucose     65 - 99 mg/dL 103 (H)  BUN     8 - 27 mg/dL 40 (H)  Creatinine     0.76 - 1.27 mg/dL 4.04 (H)  eGFR     >59 mL/min/1.73  15 (L)  BUN/Creatinine Ratio     10 - 24 10  Sodium     134 - 144 mmol/L 144  Potassium     3.5 - 5.2 mmol/L 3.6  Chloride     96 - 106 mmol/L 107 (H)  CO2     20 - 29 mmol/L 23  Calcium     8.6 - 10.2 mg/dL 9.5  Total Protein     6.0 - 8.5 g/dL 6.4  Albumin     3.7 - 4.7 g/dL 4.3  Globulin, Total     1.5 - 4.5 g/dL 2.1  Albumin/Globulin Ratio     1.2 - 2.2 2.0  Total Bilirubin     0.0 - 1.2 mg/dL 0.4  Alkaline Phosphatase     44 - 121 IU/L 65  AST     0 - 40  IU/L 20  ALT     0 - 44 IU/L 12   Results for orders placed or performed in visit on 04/16/21  Microscopic Examination   Urine  Result Value Ref Range   WBC, UA 0-5 0 - 5 /hpf   RBC 0-2 0 - 2 /hpf   Epithelial Cells (non renal) None seen 0 - 10 /hpf   Casts Present (A) None seen /lpf   Cast Type Hyaline casts N/A   Bacteria, UA None seen None seen/Few  Urinalysis, Complete  Result Value Ref Range   Specific Gravity, UA 1.020 1.005 - 1.030   pH, UA 5.5 5.0 - 7.5   Color, UA Yellow Yellow   Appearance Ur Clear Clear   Leukocytes,UA Negative Negative   Protein,UA 3+ (A) Negative/Trace   Glucose, UA Trace (A) Negative   Ketones, UA Negative Negative   RBC, UA Trace (A) Negative   Bilirubin, UA Negative Negative   Urobilinogen, Ur 0.2 0.2 - 1.0 mg/dL   Nitrite, UA Negative Negative   Microscopic Examination See below:   PSA  Result Value Ref Range   Prostate Specific Ag, Serum 7.2 (H) 0.0 - 4.0 ng/mL    I have reviewed the labs.  Pertinent Imaging N/A  Assessment & Plan:    1. Elevated PSA - PSA-pending  2. Erectile dysfunction of organic origin - Prosthesis in place  3. BPH with LUTS -PSA pending -DRE benign -most bothersome symptoms are frequency-secondary to increased fluid intake to preserve renal function -continue conservative management, avoiding bladder irritants and timed voiding's   4. Hematuria, unspecified type -Negative work-up in 2021 - no reports of gross hematuria  Return in about 6 months (around 10/14/2021) for IPSS, PSA and exam.  These notes generated with voice recognition software. I apologize for typographical errors.  [place] [address] [place], [place] [address] [phone]   Zara Council, PA-C

## 2021-04-16 ENCOUNTER — Other Ambulatory Visit: Payer: Self-pay

## 2021-04-16 ENCOUNTER — Ambulatory Visit (INDEPENDENT_AMBULATORY_CARE_PROVIDER_SITE_OTHER): Payer: Medicare HMO | Admitting: Urology

## 2021-04-16 ENCOUNTER — Encounter: Payer: Self-pay | Admitting: Urology

## 2021-04-16 VITALS — BP 126/74 | HR 88 | Ht 66.5 in | Wt 193.0 lb

## 2021-04-16 DIAGNOSIS — N138 Other obstructive and reflux uropathy: Secondary | ICD-10-CM

## 2021-04-16 DIAGNOSIS — N401 Enlarged prostate with lower urinary tract symptoms: Secondary | ICD-10-CM

## 2021-04-16 DIAGNOSIS — R972 Elevated prostate specific antigen [PSA]: Secondary | ICD-10-CM | POA: Diagnosis not present

## 2021-04-16 DIAGNOSIS — R319 Hematuria, unspecified: Secondary | ICD-10-CM

## 2021-04-16 DIAGNOSIS — N529 Male erectile dysfunction, unspecified: Secondary | ICD-10-CM

## 2021-04-16 LAB — URINALYSIS, COMPLETE
Bilirubin, UA: NEGATIVE
Ketones, UA: NEGATIVE
Leukocytes,UA: NEGATIVE
Nitrite, UA: NEGATIVE
Specific Gravity, UA: 1.02 (ref 1.005–1.030)
Urobilinogen, Ur: 0.2 mg/dL (ref 0.2–1.0)
pH, UA: 5.5 (ref 5.0–7.5)

## 2021-04-16 LAB — MICROSCOPIC EXAMINATION
Bacteria, UA: NONE SEEN
Epithelial Cells (non renal): NONE SEEN /hpf (ref 0–10)

## 2021-04-17 LAB — PSA: Prostate Specific Ag, Serum: 7.2 ng/mL — ABNORMAL HIGH (ref 0.0–4.0)

## 2021-04-21 DIAGNOSIS — N2581 Secondary hyperparathyroidism of renal origin: Secondary | ICD-10-CM | POA: Diagnosis not present

## 2021-04-21 DIAGNOSIS — I10 Essential (primary) hypertension: Secondary | ICD-10-CM | POA: Diagnosis not present

## 2021-04-21 DIAGNOSIS — R809 Proteinuria, unspecified: Secondary | ICD-10-CM | POA: Diagnosis not present

## 2021-04-21 DIAGNOSIS — N185 Chronic kidney disease, stage 5: Secondary | ICD-10-CM | POA: Diagnosis not present

## 2021-04-21 DIAGNOSIS — D631 Anemia in chronic kidney disease: Secondary | ICD-10-CM | POA: Diagnosis not present

## 2021-04-27 ENCOUNTER — Telehealth: Payer: Self-pay

## 2021-04-27 DIAGNOSIS — F3176 Bipolar disorder, in full remission, most recent episode depressed: Secondary | ICD-10-CM

## 2021-04-27 MED ORDER — LAMOTRIGINE 25 MG PO TABS: 25.0000 mg | ORAL_TABLET | Freq: Every day | ORAL | 1 refills | Status: DC

## 2021-04-27 NOTE — Telephone Encounter (Signed)
I have sent Lamictal to pharmacy.

## 2021-04-27 NOTE — Telephone Encounter (Signed)
pt left message he needs a refill on the lamictal.

## 2021-05-11 ENCOUNTER — Encounter: Payer: Self-pay | Admitting: Surgery

## 2021-05-11 ENCOUNTER — Ambulatory Visit: Payer: Self-pay | Admitting: *Deleted

## 2021-05-11 ENCOUNTER — Ambulatory Visit: Payer: Medicare HMO | Admitting: Surgery

## 2021-05-11 ENCOUNTER — Other Ambulatory Visit: Payer: Self-pay

## 2021-05-11 VITALS — BP 154/86 | HR 78 | Temp 98.4°F | Resp 16 | Ht 66.5 in | Wt 195.0 lb

## 2021-05-11 DIAGNOSIS — Z992 Dependence on renal dialysis: Secondary | ICD-10-CM | POA: Diagnosis not present

## 2021-05-11 DIAGNOSIS — N186 End stage renal disease: Secondary | ICD-10-CM | POA: Diagnosis not present

## 2021-05-11 NOTE — Progress Notes (Signed)
Outpatient Surgical Follow Up  05/11/2021  Luis Coss. is an 76 y.o. male.   Chief Complaint  Patient presents with   New Patient (Initial Visit)    Peritoneal Dialysis Catheter    HPI: Seen in consultation at the request of Dr. Holley Raring.  He does have chronic kidney disease stage V proteinuria, hypertension and hyperparathyroidism.  eGFR down to 11.  He is approaching renal replacement therapy.  He is very interested in peritoneal dialysis at home.  He denies any major abdominal operation.  He did have a CT scan 2 years ago that have personally reviewed showing evidence of a pain reservoir to the right of the abdominal wall. His hemoglobin was 10.8.  Platelets of 137,000.  Creatinine of 5.1 potassium of 3.6 He is able to perform more than 4 METS of activity without any shortness of breath or chest pain. Past Medical History:  Diagnosis Date   Acute renal failure (ARF) ([place]) 08/02/2019   Anemia    Apnea, sleep    CPAP   Arthritis    Bipolar affective ([place])    Cancer ([place]) malignant melanoma   COVID-19 12/17/2019   CRI (chronic renal insufficiency)    Crohn's disease (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[place])    Paraphimosis    Peyronie disease    Secondary hyperparathyroidism of renal origin ([place])    Skin cancer    Testicular hypofunction    Urinary frequency    Urinary hesitancy    Wears hearing aid in both ears     Past Surgical History:  Procedure Laterality Date   arm fracture     ARTERY BIOPSY Left 07/04/2020   Procedure: BIOPSY TEMPORAL ARTERY;  Surgeon: Katha Cabal, MD;  Location: [facility];  Service: Vascular;  Laterality: Left;   BONE MARROW BIOPSY     CATARACT EXTRACTION W/PHACO Right 04/09/2020   Procedure: CATARACT EXTRACTION PHACO AND INTRAOCULAR LENS PLACEMENT  (IOC) RIGHT EYHANCE TORIC 6.84 01:03.7 10.7%;  Surgeon: Leandrew Koyanagi, MD;  Location: [facility];  Service: Ophthalmology;  Laterality: Right;  sleep apnea   CATARACT EXTRACTION W/PHACO Left 05/07/2020   Procedure: CATARACT EXTRACTION PHACO AND INTRAOCULAR LENS PLACEMENT (IOC) LEFT EYHANCE TORIC 2.93 00:58.7 5.0%;  Surgeon: Leandrew Koyanagi, MD;  Location: [place] [place];  Service: Ophthalmology;  Laterality: Left;   COLONOSCOPY WITH PROPOFOL N/A 12/23/2014   Procedure: COLONOSCOPY WITH PROPOFOL;  Surgeon: Manya Silvas, MD;  Location: [facility] ENDOSCOPY;  Service: Endoscopy;  Laterality: N/A;   COLONOSCOPY WITH PROPOFOL N/A 02/06/2018   Procedure: COLONOSCOPY WITH PROPOFOL;  Surgeon: Manya Silvas, MD;  Location: [facility] ENDOSCOPY;  Service: Endoscopy;  Laterality: N/A;   KNEE ARTHROPLASTY Left 02/22/2018   Procedure: COMPUTER ASSISTED TOTAL KNEE ARTHROPLASTY;  Surgeon: Dereck Leep, MD;  Location: [facility];  Service: Orthopedics;  Laterality: Left;   MELANOMA EXCISION     PENILE PROSTHESIS IMPLANT     PENILE PROSTHESIS IMPLANT N/A 08/25/2015   Procedure: REPLACEMENT OF INFLATABLE PENILE PROSTHESIS COMPONENTS;  Surgeon: Cleon Gustin, MD;  Location: [facility];  Service: Urology;  Laterality: N/A;   SKIN CANCER EXCISION     nose   TONSILLECTOMY     VASECTOMY      Family  History  Problem Relation Age of Onset   Dementia Mother    Dementia Sister    Depression Sister    Bipolar disorder Other    Prostate cancer Neg Hx    Kidney cancer Neg Hx    Bladder Cancer Neg Hx     Social History:  reports that he quit smoking about 48 years ago. His smoking use included cigarettes. He has never used smokeless tobacco. He reports that he does not currently use alcohol. He reports that he does not use drugs.  Allergies:  Allergies  Allergen Reactions   Mirtazapine Rash   Aspirin     Other reaction(s): Unknown Patient was instructed not to take aspirin but can not  remember why   Atomoxetine Other (See Comments)    Urinary sx   Strattera [Atomoxetine Hcl] Other (See Comments)    Urinary sx   Xyzal [Levocetirizine Dihydrochloride] Rash    Patient reported on 11/02/17    Medications reviewed.    ROS Full ROS performed and is otherwise negative other than what is stated in HPI   BP (!) 154/86    Pulse 78    Temp 98.4 F (36.9 C) (Oral)    Resp 16    Ht 5' 6.5" (1.689 m)    Wt 195 lb (88.5 kg)    SpO2 97%    BMI 31.00 kg/m   Physical Exam Vitals and nursing note reviewed. Exam conducted with a chaperone present.  Constitutional:      General: He is not in acute distress.    Appearance: Normal appearance. He is normal weight. He is not toxic-appearing.  Eyes:     General: No scleral icterus.       Right eye: No discharge.        Left eye: No discharge.  Cardiovascular:     Rate and Rhythm: Normal rate and regular rhythm.  Pulmonary:     Effort: Pulmonary effort is normal. No respiratory distress.     Breath sounds: Normal breath sounds. No stridor.  Abdominal:     General: Abdomen is flat. There is no distension.     Palpations: Abdomen is soft. There is no mass.     Tenderness: There is no abdominal tenderness. There is no guarding.     Hernia: No hernia is present.  Musculoskeletal:        General: No swelling or tenderness. Normal range of motion.     Cervical back: Normal range of motion and neck supple. No rigidity or tenderness.  Lymphadenopathy:     Cervical: No cervical adenopathy.  Skin:    General: Skin is warm and dry.     Capillary Refill: Capillary refill takes less than 2 seconds.  Neurological:     General: No focal deficit present.     Mental Status: He is alert.  Psychiatric:        Mood and Affect: Mood normal.        Behavior: Behavior normal.        Thought Content: Thought content normal.        Judgment: Judgment normal.     Assessment/Plan: 76 year old male with chronic renal insufficiency approaching  need for renal replacement.  We had an discussion regarding different modalities of dialysis.  He is very interested in pursuing peritoneal dialysis.  We had an extensive discussion about laparoscopic PD placement.  Procedure discussed with the patient in detail.  Risks, benefits and possible complications including but not limited to: Bleeding,  catheter malfunction, abdominal injuries, infection and the need for potential reoperations.  He understands and wishes to proceed.  Specifically we talked about the reservoir for the penile implant.  We will tentatively plan to do to have catheter exit on the left side Please note that I spent 60 minutes in this encounter including personally reviewing imaging studies, placing orders, counseling the patient, coordinating his care and performing appropriate documentation     Caroleen Hamman, MD [facility]

## 2021-05-11 NOTE — Patient Instructions (Addendum)
Our surgery scheduler will contact you in regards to your surgery and pre-op dates.   Please have the BLUE Sheet available when the scheduler calls.    No heavy lifting > 10 lbs, excessive bending, pushing, pulling, or squatting for 6-8 weeks after surgery. You may gradually increase activity as tolerated.

## 2021-05-11 NOTE — Telephone Encounter (Signed)
°  Chief Complaint: requesting appt. For cough  Symptoms: productive cough yellow and green sputum, nasal stuffiness, difficulty breathing at night and in am when waking up. Breathing "ok" during the day.  Frequency: x 1 week  Pertinent Negatives: Patient denies chest pain , difficulty breathing now . No fever, no headache, no sore throat. Has not tested for covid  Disposition: [] ED /[] Urgent Care (no appt availability in office) / [x] Appointment(In office/virtual)/ []  [facility]/ [] Home Care/ [] Refused Recommended Disposition /[] [facility]/ []  Follow-up with PCP Additional Notes:  Appt scheduled 05/12/21. Instructed patient if difficulty breathing worsens go to ED.    Reason for Disposition  Cough has been present for > 3 weeks    X 1 week  Answer Assessment - Initial Assessment Questions 1. ONSET: "When did the cough begin?"      X 1 week  2. SEVERITY: "How bad is the cough today?"      Not bad and not constant  3. SPUTUM: "Describe the color of your sputum" (none, dry cough; clear, white, yellow, green)     Yellow , green at times  4. HEMOPTYSIS: "Are you coughing up any blood?" If so ask: "How much?" (flecks, streaks, tablespoons, etc.)     no 5. DIFFICULTY BREATHING: "Are you having difficulty breathing?" If Yes, ask: "How bad is it?" (e.g., mild, moderate, severe)    - MILD: No SOB at rest, mild SOB with walking, speaks normally in sentences, can lie down, no retractions, pulse < 100.    - MODERATE: SOB at rest, SOB with minimal exertion and prefers to sit, cannot lie down flat, speaks in phrases, mild retractions, audible wheezing, pulse 100-120.    - SEVERE: Very SOB at rest, speaks in single words, struggling to breathe, sitting hunched forward, retractions, pulse > 120      Difficulty breathing at night and in mornings. Ok during the day  6. FEVER: "Do you have a fever?" If Yes, ask: "What is your temperature, how was it measured, and when did it start?"      na 7. CARDIAC HISTORY: "Do you have any history of heart disease?" (e.g., heart attack, congestive heart failure)      na 8. LUNG HISTORY: "Do you have any history of lung disease?"  (e.g., pulmonary embolus, asthma, emphysema)     na 9. PE RISK FACTORS: "Do you have a history of blood clots?" (or: recent major surgery, recent prolonged travel, bedridden)     na 10. OTHER SYMPTOMS: "Do you have any other symptoms?" (e.g., runny nose, wheezing, chest pain)       Nasal stuffiness, cough  11. PREGNANCY: "Is there any chance you are pregnant?" "When was your last menstrual period?"       na 12. TRAVEL: "Have you traveled out of the country in the last month?" (e.g., travel history, exposures)       na  Protocols used: Cough - Acute Productive-A-AH

## 2021-05-11 NOTE — H&P (View-Only) (Signed)
Outpatient Surgical Follow Up  05/11/2021  Luis Miller. is an 76 y.o. male.   Chief Complaint  Patient presents with   New Patient (Initial Visit)    Peritoneal Dialysis Catheter    HPI: Seen in consultation at the request of Dr. Holley Raring.  He does have chronic kidney disease stage V proteinuria, hypertension and hyperparathyroidism.  eGFR down to 11.  He is approaching renal replacement therapy.  He is very interested in peritoneal dialysis at home.  He denies any major abdominal operation.  He did have a CT scan 2 years ago that have personally reviewed showing evidence of a pain reservoir to the right of the abdominal wall. His hemoglobin was 10.8.  Platelets of 137,000.  Creatinine of 5.1 potassium of 3.6 He is able to perform more than 4 METS of activity without any shortness of breath or chest pain. Past Medical History:  Diagnosis Date   Acute renal failure (ARF) ([place]) 08/02/2019   Anemia    Apnea, sleep    CPAP   Arthritis    Bipolar affective ([place])    Cancer ([place]) malignant melanoma   COVID-19 12/17/2019   CRI (chronic renal insufficiency)    Crohn's disease (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[place])    Paraphimosis    Peyronie disease    Secondary hyperparathyroidism of renal origin ([place])    Skin cancer    Testicular hypofunction    Urinary frequency    Urinary hesitancy    Wears hearing aid in both ears     Past Surgical History:  Procedure Laterality Date   arm fracture     ARTERY BIOPSY Left 07/04/2020   Procedure: BIOPSY TEMPORAL ARTERY;  Surgeon: Katha Cabal, MD;  Location: [facility];  Service: Vascular;  Laterality: Left;   BONE MARROW BIOPSY     CATARACT EXTRACTION W/PHACO Right 04/09/2020   Procedure: CATARACT EXTRACTION PHACO AND INTRAOCULAR LENS PLACEMENT  (IOC) RIGHT EYHANCE TORIC 6.84 01:03.7 10.7%;  Surgeon: Leandrew Koyanagi, MD;  Location: [facility];  Service: Ophthalmology;  Laterality: Right;  sleep apnea   CATARACT EXTRACTION W/PHACO Left 05/07/2020   Procedure: CATARACT EXTRACTION PHACO AND INTRAOCULAR LENS PLACEMENT (IOC) LEFT EYHANCE TORIC 2.93 00:58.7 5.0%;  Surgeon: Leandrew Koyanagi, MD;  Location: [place];  Service: Ophthalmology;  Laterality: Left;   COLONOSCOPY WITH PROPOFOL N/A 12/23/2014   Procedure: COLONOSCOPY WITH PROPOFOL;  Surgeon: Manya Silvas, MD;  Location: [facility] ENDOSCOPY;  Service: Endoscopy;  Laterality: N/A;   COLONOSCOPY WITH PROPOFOL N/A 02/06/2018   Procedure: COLONOSCOPY WITH PROPOFOL;  Surgeon: Manya Silvas, MD;  Location: [facility] ENDOSCOPY;  Service: Endoscopy;  Laterality: N/A;   KNEE ARTHROPLASTY Left 02/22/2018   Procedure: COMPUTER ASSISTED TOTAL KNEE ARTHROPLASTY;  Surgeon: Dereck Leep, MD;  Location: [facility];  Service: Orthopedics;  Laterality: Left;   MELANOMA EXCISION     PENILE PROSTHESIS IMPLANT     PENILE PROSTHESIS IMPLANT N/A 08/25/2015   Procedure: REPLACEMENT OF INFLATABLE PENILE PROSTHESIS COMPONENTS;  Surgeon: Cleon Gustin, MD;  Location: [facility];  Service: Urology;  Laterality: N/A;   SKIN CANCER EXCISION     nose   TONSILLECTOMY     VASECTOMY      Family  History  Problem Relation Age of Onset   Dementia Mother    Dementia Sister    Depression Sister    Bipolar disorder Other    Prostate cancer Neg Hx    Kidney cancer Neg Hx    Bladder Cancer Neg Hx     Social History:  reports that he quit smoking about 48 years ago. His smoking use included cigarettes. He has never used smokeless tobacco. He reports that he does not currently use alcohol. He reports that he does not use drugs.  Allergies:  Allergies  Allergen Reactions   Mirtazapine Rash   Aspirin     Other reaction(s): Unknown Patient was instructed not to take aspirin but can not  remember why   Atomoxetine Other (See Comments)    Urinary sx   Strattera [Atomoxetine Hcl] Other (See Comments)    Urinary sx   Xyzal [Levocetirizine Dihydrochloride] Rash    Patient reported on 11/02/17    Medications reviewed.    ROS Full ROS performed and is otherwise negative other than what is stated in HPI   BP (!) 154/86    Pulse 78    Temp 98.4 F (36.9 C) (Oral)    Resp 16    Ht 5' 6.5" (1.689 m)    Wt 195 lb (88.5 kg)    SpO2 97%    BMI 31.00 kg/m   Physical Exam Vitals and nursing note reviewed. Exam conducted with a chaperone present.  Constitutional:      General: He is not in acute distress.    Appearance: Normal appearance. He is normal weight. He is not toxic-appearing.  Eyes:     General: No scleral icterus.       Right eye: No discharge.        Left eye: No discharge.  Cardiovascular:     Rate and Rhythm: Normal rate and regular rhythm.  Pulmonary:     Effort: Pulmonary effort is normal. No respiratory distress.     Breath sounds: Normal breath sounds. No stridor.  Abdominal:     General: Abdomen is flat. There is no distension.     Palpations: Abdomen is soft. There is no mass.     Tenderness: There is no abdominal tenderness. There is no guarding.     Hernia: No hernia is present.  Musculoskeletal:        General: No swelling or tenderness. Normal range of motion.     Cervical back: Normal range of motion and neck supple. No rigidity or tenderness.  Lymphadenopathy:     Cervical: No cervical adenopathy.  Skin:    General: Skin is warm and dry.     Capillary Refill: Capillary refill takes less than 2 seconds.  Neurological:     General: No focal deficit present.     Mental Status: He is alert.  Psychiatric:        Mood and Affect: Mood normal.        Behavior: Behavior normal.        Thought Content: Thought content normal.        Judgment: Judgment normal.     Assessment/Plan: 76 year old male with chronic renal insufficiency approaching  need for renal replacement.  We had an discussion regarding different modalities of dialysis.  He is very interested in pursuing peritoneal dialysis.  We had an extensive discussion about laparoscopic PD placement.  Procedure discussed with the patient in detail.  Risks, benefits and possible complications including but not limited to: Bleeding,  catheter malfunction, abdominal injuries, infection and the need for potential reoperations.  He understands and wishes to proceed.  Specifically we talked about the reservoir for the penile implant.  We will tentatively plan to do to have catheter exit on the left side Please note that I spent 60 minutes in this encounter including personally reviewing imaging studies, placing orders, counseling the patient, coordinating his care and performing appropriate documentation     Caroleen Hamman, MD [facility]

## 2021-05-12 ENCOUNTER — Telehealth: Payer: Self-pay | Admitting: Surgery

## 2021-05-12 ENCOUNTER — Encounter: Payer: Self-pay | Admitting: Family Medicine

## 2021-05-12 ENCOUNTER — Ambulatory Visit: Payer: Self-pay | Admitting: *Deleted

## 2021-05-12 ENCOUNTER — Ambulatory Visit (INDEPENDENT_AMBULATORY_CARE_PROVIDER_SITE_OTHER): Payer: Medicare HMO | Admitting: Family Medicine

## 2021-05-12 ENCOUNTER — Other Ambulatory Visit: Payer: Self-pay

## 2021-05-12 VITALS — BP 143/73 | HR 72 | Temp 97.9°F | Wt 196.4 lb

## 2021-05-12 DIAGNOSIS — R051 Acute cough: Secondary | ICD-10-CM | POA: Diagnosis not present

## 2021-05-12 DIAGNOSIS — R0981 Nasal congestion: Secondary | ICD-10-CM

## 2021-05-12 MED ORDER — PREDNISONE 50 MG PO TABS: 50.0000 mg | ORAL_TABLET | Freq: Every day | ORAL | 0 refills | Status: DC

## 2021-05-12 MED ORDER — FEXOFENADINE HCL 180 MG PO TABS: 180.0000 mg | ORAL_TABLET | Freq: Every day | ORAL | 12 refills | Status: DC

## 2021-05-12 NOTE — Progress Notes (Signed)
BP (!) 143/73    Pulse 72    Temp 97.9 F (36.6 C)    Wt 196 lb 6.4 oz (89.1 kg)    SpO2 97%    BMI 31.22 kg/m    Subjective:    Patient ID: Luis Coss., male    DOB: 1946-02-14, 76 y.o.   MRN: [id number]  HPI: Luis Miller. is a 76 y.o. male  Chief Complaint  Patient presents with   Nasal Congestion    Patient states has a stuffy nose and sneezing. When patient blows his nose he can see some blood in the mucus. Patient states symptoms have been going on for a week.    Cough    Patient states he is coughing up green/yellow mucus.   UPPER RESPIRATORY TRACT INFECTION Duration: 1.5 week Worst symptom: congestion, cough Fever: no Cough: yes Shortness of breath: no Wheezing: no Chest pain: no Chest tightness: no Chest congestion: no Nasal congestion: yes Runny nose: yes Post nasal drip: yes Sneezing: no Sore throat: no Swollen glands: no Sinus pressure: no Headache: no Face pain: no Toothache: no Ear pain: no  Ear pressure: no  Eyes red/itching:no Eye drainage/crusting: no  Vomiting: no Rash: no Fatigue: yes Sick contacts: no Strep contacts: no  Context: worse Recurrent sinusitis: no Relief with OTC cold/cough medications: no  Treatments attempted: none   Relevant past medical, surgical, family and social history reviewed and updated as indicated. Interim medical history since our last visit reviewed. Allergies and medications reviewed and updated.  Review of Systems  Constitutional: Negative.   HENT:  Positive for congestion, postnasal drip and rhinorrhea. Negative for dental problem, drooling, ear discharge, ear pain, facial swelling, hearing loss, mouth sores, nosebleeds, sinus pressure, sinus pain, sneezing, sore throat, tinnitus and trouble swallowing.   Eyes: Negative.   Respiratory: Negative.    Cardiovascular: Negative.   Gastrointestinal: Negative.   Psychiatric/Behavioral: Negative.     Per HPI unless specifically indicated above      Objective:    BP (!) 143/73    Pulse 72    Temp 97.9 F (36.6 C)    Wt 196 lb 6.4 oz (89.1 kg)    SpO2 97%    BMI 31.22 kg/m   Wt Readings from Last 3 Encounters:  05/12/21 196 lb 6.4 oz (89.1 kg)  05/11/21 195 lb (88.5 kg)  04/16/21 193 lb (87.5 kg)    Physical Exam Vitals and nursing note reviewed.  Constitutional:      General: He is not in acute distress.    Appearance: Normal appearance. He is obese. He is not ill-appearing, toxic-appearing or diaphoretic.  HENT:     Head: Normocephalic and atraumatic.     Right Ear: Tympanic membrane, ear canal and external ear normal.     Left Ear: Tympanic membrane, ear canal and external ear normal.     Nose: Rhinorrhea present. No congestion.     Mouth/Throat:     Mouth: Mucous membranes are moist.     Pharynx: Posterior oropharyngeal erythema present. No oropharyngeal exudate.  Eyes:     General: No scleral icterus.       Right eye: No discharge.        Left eye: No discharge.     Extraocular Movements: Extraocular movements intact.     Conjunctiva/sclera: Conjunctivae normal.     Pupils: Pupils are equal, round, and reactive to light.  Cardiovascular:     Rate and Rhythm: Normal rate and  regular rhythm.     Pulses: Normal pulses.     Heart sounds: Normal heart sounds. No murmur heard.   No friction rub. No gallop.  Pulmonary:     Effort: Pulmonary effort is normal. No respiratory distress.     Breath sounds: Normal breath sounds. No stridor. No wheezing, rhonchi or rales.  Chest:     Chest wall: No tenderness.  Musculoskeletal:        General: Normal range of motion.     Cervical back: Normal range of motion and neck supple.  Skin:    General: Skin is warm and dry.     Capillary Refill: Capillary refill takes less than 2 seconds.     Coloration: Skin is not jaundiced or pale.     Findings: No bruising, erythema, lesion or rash.  Neurological:     General: No focal deficit present.     Mental Status: He is alert and  oriented to person, place, and time. Mental status is at baseline.  Psychiatric:        Mood and Affect: Mood normal.        Behavior: Behavior normal.        Thought Content: Thought content normal.        Judgment: Judgment normal.    Results for orders placed or performed in visit on 04/16/21  Microscopic Examination   Urine  Result Value Ref Range   WBC, UA 0-5 0 - 5 /hpf   RBC 0-2 0 - 2 /hpf   Epithelial Cells (non renal) None seen 0 - 10 /hpf   Casts Present (A) None seen /lpf   Cast Type Hyaline casts N/A   Bacteria, UA None seen None seen/Few  Urinalysis, Complete  Result Value Ref Range   Specific Gravity, UA 1.020 1.005 - 1.030   pH, UA 5.5 5.0 - 7.5   Color, UA Yellow Yellow   Appearance Ur Clear Clear   Leukocytes,UA Negative Negative   Protein,UA 3+ (A) Negative/Trace   Glucose, UA Trace (A) Negative   Ketones, UA Negative Negative   RBC, UA Trace (A) Negative   Bilirubin, UA Negative Negative   Urobilinogen, Ur 0.2 0.2 - 1.0 mg/dL   Nitrite, UA Negative Negative   Microscopic Examination See below:   PSA  Result Value Ref Range   Prostate Specific Ag, Serum 7.2 (H) 0.0 - 4.0 ng/mL      Assessment & Plan:   Problem List Items Addressed This Visit   None Visit Diagnoses     Nasal congestion    -  Primary   Will treat with prednisone and allegra. Call if not getting better or getting worse. Continue to monitor.    Acute cough       Flu and strep negative.    Relevant Orders   Veritor Flu A/B Waived   Rapid Strep Screen ([facility] ONLY)   Novel Coronavirus, NAA ([facility])        Follow up plan: Return if symptoms worsen or fail to improve.

## 2021-05-12 NOTE — Telephone Encounter (Signed)
Received call back from patient, he is now aware of all dates regarding surgery and verbalized understanding.

## 2021-05-12 NOTE — Telephone Encounter (Signed)
Outgoing call is made, left message for patient to call me. Please inform patient of the following regarding scheduled surgery:    Pre-Admission date/time, COVID Testing date and Surgery date.  Surgery Date: 05/21/21 Preadmission Testing Date: 05/15/21 (phone 1p-5p) Covid Testing Date: Not needed.     Also patient will need to call at [phone], between 1-3:00pm the day before surgery, to find out what time to arrive for surgery.

## 2021-05-12 NOTE — Telephone Encounter (Signed)
That is if he has any issues with allergies. He can take it during the cold and during changes in the season.

## 2021-05-12 NOTE — Telephone Encounter (Signed)
Per agent: "Pt called asking how is he supposed to take the [place] that Dr. Wynetta Emery prescribed.  He thought it was was the duration of his cold.  He said the pharmacy gave him 90 pills."   CB#  [phone]   Please advise. Thank you  Reason for Disposition  [1] Caller has NON-URGENT medicine question about med that PCP prescribed AND [2] triager unable to answer question  Answer Assessment - Initial Assessment Questions 1. NAME of MEDICATION: "What medicine are you calling about?"     Allerga 2. QUESTION: "What is your question?" (e.g., double dose of medicine, side effect)     How long do I take it? 3. PRESCRIBING HCP: "Who prescribed it?" Reason: if prescribed by specialist, call should be referred to that group.     *No Answer* 4. SYMPTOMS: "Do you have any symptoms?"     *No Answer* 5. SEVERITY: If symptoms are present, ask "Are they mild, moderate or severe?"     *No Answer* 6. PREGNANCY:  "Is there any chance that you are pregnant?" "When was your last menstrual period?"     *No Answer*  Protocols used: Medication Question Call-A-AH

## 2021-05-12 NOTE — Telephone Encounter (Signed)
Spoke with patient and notified patient of recommendations per Dr.Johnson. Patient verbalized understanding and has no further questions at this time. Patient advised to get our office a call back if he has any questions or concerns.

## 2021-05-13 DIAGNOSIS — R809 Proteinuria, unspecified: Secondary | ICD-10-CM | POA: Diagnosis not present

## 2021-05-13 DIAGNOSIS — I10 Essential (primary) hypertension: Secondary | ICD-10-CM | POA: Diagnosis not present

## 2021-05-13 DIAGNOSIS — D631 Anemia in chronic kidney disease: Secondary | ICD-10-CM | POA: Diagnosis not present

## 2021-05-13 DIAGNOSIS — N185 Chronic kidney disease, stage 5: Secondary | ICD-10-CM | POA: Diagnosis not present

## 2021-05-13 LAB — NOVEL CORONAVIRUS, NAA: SARS-CoV-2, NAA: NOT DETECTED

## 2021-05-14 ENCOUNTER — Ambulatory Visit: Payer: Self-pay

## 2021-05-14 NOTE — Telephone Encounter (Signed)
Pt called, advised he can take Allegra in the morning or night. He can decide which works best for him. Pt states he will try at night time since that's when he has the most symptoms.  Summary: Medication Managment   Patient seeking clarity of frequency for fexofenadine (ALLEGRA ALLERGY) 180 MG tablet, patient unclear when he should take medication before breakfast, around lunch time.      Reason for Disposition  Caller has medicine question only, adult not sick, AND triager answers question  Answer Assessment - Initial Assessment Questions 1. NAME of MEDICATION: "What medicine are you calling about?"     allegra 2. QUESTION: "What is your question?" (e.g., double dose of medicine, side effect)     When is best to take the med 3. PRESCRIBING HCP: "Who prescribed it?" Reason: if prescribed by specialist, call should be referred to that group.     Santiago Glad, NP 4. SYMPTOMS: "Do you have any symptoms?"     Allergy symptoms 5. SEVERITY: If symptoms are present, ask "Are they mild, moderate or severe?"     mild  Protocols used: Medication Question Call-A-AH

## 2021-05-15 ENCOUNTER — Other Ambulatory Visit: Payer: Self-pay

## 2021-05-15 ENCOUNTER — Encounter
Admission: RE | Admit: 2021-05-15 | Discharge: 2021-05-15 | Disposition: A | Discharge status: Home / Self Care | Payer: Medicare HMO | Source: Ambulatory Visit | Attending: Surgery | Admitting: Surgery

## 2021-05-15 HISTORY — DX: Pneumothorax, unspecified: J93.9

## 2021-05-15 HISTORY — DX: Atherosclerosis of aorta: I70.0

## 2021-05-15 HISTORY — DX: Thyrotoxicosis, unspecified without thyrotoxic crisis or storm: E05.90

## 2021-05-15 LAB — VERITOR FLU A/B WAIVED
Influenza A: NEGATIVE
Influenza B: NEGATIVE

## 2021-05-15 LAB — RAPID STREP SCREEN (MED CTR MEBANE ONLY): Strep Gp A Ag, IA W/Reflex: NEGATIVE

## 2021-05-15 LAB — CULTURE, GROUP A STREP: Strep A Culture: NEGATIVE

## 2021-05-15 NOTE — Patient Instructions (Addendum)
Your procedure is scheduled on: Thursday, March 2 Report to the Registration Desk on the 1st floor of the [facility]. To find out your arrival time, please call [phone] between 1PM - 3PM on: Wednesday, March 1  REMEMBER: Instructions that are not followed completely may result in serious medical risk, up to and including death; or upon the discretion of your surgeon and anesthesiologist your surgery may need to be rescheduled.  Do not eat food after midnight the night before surgery.  No gum chewing, lozengers or hard candies.  You may however, drink CLEAR liquids up to 2 hours before you are scheduled to arrive for your surgery. Do not drink anything within 2 hours of your scheduled arrival time.  Clear liquids include: - water  - apple juice without pulp - gatorade (not RED colors) - black coffee or tea (Do NOT add milk or creamers to the coffee or tea) Do NOT drink anything that is not on this list.  TAKE THESE MEDICATIONS THE MORNING OF SURGERY WITH A SIP OF WATER:  Amlodipine Hydralazine Prednisone Rosuvastatin  One week prior to surgery: Stop Anti-inflammatories (NSAIDS) such as Advil, Aleve, Ibuprofen, Motrin, Naproxen, Naprosyn and Aspirin based products such as Excedrin, Goodys Powder, BC Powder. Stop ANY OVER THE COUNTER supplements until after surgery. You may however, continue to take Tylenol if needed for pain up until the day of surgery.  No Alcohol for 24 hours before or after surgery.  No Smoking including e-cigarettes for 24 hours prior to surgery.  No chewable tobacco products for at least 6 hours prior to surgery.  No nicotine patches on the day of surgery.  Do not use any "recreational" drugs for at least a week prior to your surgery.  Please be advised that the combination of cocaine and anesthesia may have negative outcomes, up to and including death. If you test positive for cocaine, your surgery will be cancelled.  On the morning of surgery  brush your teeth with toothpaste and water, you may rinse your mouth with mouthwash if you wish. Do not swallow any toothpaste or mouthwash.  Use CHG Soap as directed on instruction sheet.  Do not wear jewelry.  Do not wear lotions, powders, or perfumes.   Do not shave body from the neck down 48 hours prior to surgery just in case you cut yourself which could leave a site for infection.  Also, freshly shaved skin may become irritated if using the CHG soap.  Contact lenses, hearing aids and dentures may not be worn into surgery.  Do not bring valuables to the hospital. [facility] is not responsible for any missing/lost belongings or valuables.   Bring your C-PAP to the hospital with you in case you may have to spend the night.   Notify your doctor if there is any change in your medical condition (cold, fever, infection).  Wear comfortable clothing (specific to your surgery type) to the hospital.  After surgery, you can help prevent lung complications by doing breathing exercises.  Take deep breaths and cough every 1-2 hours. Your doctor may order a device called an Incentive Spirometer to help you take deep breaths.  If you are being discharged the day of surgery, you will not be allowed to drive home. You will need a responsible adult (18 years or older) to drive you home and stay with you that night.   If you are taking public transportation, you will need to have a responsible adult (18 years or older)  with you. Please confirm with your physician that it is acceptable to use public transportation.   Please call the [place] Dept. at [phone] if you have any questions about these instructions.  Surgery Visitation Policy:  Patients undergoing a surgery or procedure may have one family member or support person with them as long as that person is not COVID-19 positive or experiencing its symptoms.  That person may remain in the waiting area during the procedure  and may rotate out with other people.

## 2021-05-17 DIAGNOSIS — Z8249 Family history of ischemic heart disease and other diseases of the circulatory system: Secondary | ICD-10-CM | POA: Diagnosis not present

## 2021-05-17 DIAGNOSIS — M109 Gout, unspecified: Secondary | ICD-10-CM | POA: Diagnosis not present

## 2021-05-17 DIAGNOSIS — Z87891 Personal history of nicotine dependence: Secondary | ICD-10-CM | POA: Diagnosis not present

## 2021-05-17 DIAGNOSIS — G4700 Insomnia, unspecified: Secondary | ICD-10-CM | POA: Diagnosis not present

## 2021-05-17 DIAGNOSIS — E669 Obesity, unspecified: Secondary | ICD-10-CM | POA: Diagnosis not present

## 2021-05-17 DIAGNOSIS — R69 Illness, unspecified: Secondary | ICD-10-CM | POA: Diagnosis not present

## 2021-05-17 DIAGNOSIS — Z7722 Contact with and (suspected) exposure to environmental tobacco smoke (acute) (chronic): Secondary | ICD-10-CM | POA: Diagnosis not present

## 2021-05-17 DIAGNOSIS — Z008 Encounter for other general examination: Secondary | ICD-10-CM | POA: Diagnosis not present

## 2021-05-17 DIAGNOSIS — Z85828 Personal history of other malignant neoplasm of skin: Secondary | ICD-10-CM | POA: Diagnosis not present

## 2021-05-17 DIAGNOSIS — I10 Essential (primary) hypertension: Secondary | ICD-10-CM | POA: Diagnosis not present

## 2021-05-17 DIAGNOSIS — K589 Irritable bowel syndrome without diarrhea: Secondary | ICD-10-CM | POA: Diagnosis not present

## 2021-05-17 DIAGNOSIS — E785 Hyperlipidemia, unspecified: Secondary | ICD-10-CM | POA: Diagnosis not present

## 2021-05-17 DIAGNOSIS — Z886 Allergy status to analgesic agent status: Secondary | ICD-10-CM | POA: Diagnosis not present

## 2021-05-18 ENCOUNTER — Other Ambulatory Visit: Payer: Self-pay

## 2021-05-18 ENCOUNTER — Encounter
Admission: RE | Admit: 2021-05-18 | Discharge: 2021-05-18 | Disposition: A | Discharge status: Home / Self Care | Payer: Medicare HMO | Source: Ambulatory Visit | Attending: Surgery | Admitting: Surgery

## 2021-05-18 DIAGNOSIS — I10 Essential (primary) hypertension: Secondary | ICD-10-CM | POA: Insufficient documentation

## 2021-05-18 DIAGNOSIS — Z01810 Encounter for preprocedural cardiovascular examination: Secondary | ICD-10-CM | POA: Diagnosis not present

## 2021-05-20 MED ORDER — CEFAZOLIN SODIUM-DEXTROSE 2-4 GM/100ML-% IV SOLN
2.0000 g | INTRAVENOUS | Status: AC
  Administered 2021-05-21: 2 g via INTRAVENOUS

## 2021-05-20 MED ORDER — CHLORHEXIDINE GLUCONATE CLOTH 2 % EX PADS: 6.0000 | MEDICATED_PAD | Freq: Once | CUTANEOUS | Status: DC

## 2021-05-20 MED ORDER — CHLORHEXIDINE GLUCONATE 0.12 % MT SOLN
15.0000 mL | Freq: Once | OROMUCOSAL | Status: AC
  Administered 2021-05-21: 15 mL via OROMUCOSAL

## 2021-05-20 MED ORDER — FAMOTIDINE 20 MG PO TABS: 20.0000 mg | ORAL_TABLET | Freq: Once | ORAL | Status: AC

## 2021-05-20 MED ORDER — GABAPENTIN 300 MG PO CAPS: 300.0000 mg | ORAL_CAPSULE | ORAL | Status: AC

## 2021-05-20 MED ORDER — ORAL CARE MOUTH RINSE
15.0000 mL | Freq: Once | OROMUCOSAL | Status: AC
Start: 2021-05-20 — End: 2021-05-21

## 2021-05-20 MED ORDER — ACETAMINOPHEN 500 MG PO TABS: 1000.0000 mg | ORAL_TABLET | ORAL | Status: AC

## 2021-05-20 MED ORDER — SODIUM CHLORIDE 0.9 % IV SOLN: INTRAVENOUS | Status: DC

## 2021-05-21 ENCOUNTER — Encounter: Payer: Self-pay | Admitting: Surgery

## 2021-05-21 ENCOUNTER — Encounter
Admission: RE | Disposition: A | Discharge status: Home / Self Care | Payer: Self-pay | Source: Home / Self Care | Attending: Surgery

## 2021-05-21 ENCOUNTER — Ambulatory Visit
Admission: RE | Admit: 2021-05-21 | Discharge: 2021-05-21 | Disposition: A | Discharge status: Home / Self Care | Payer: Medicare HMO | Attending: Surgery | Admitting: Surgery

## 2021-05-21 ENCOUNTER — Ambulatory Visit: Payer: Medicare HMO | Admitting: Anesthesiology

## 2021-05-21 ENCOUNTER — Ambulatory Visit: Payer: Medicare Other | Admitting: Nurse Practitioner

## 2021-05-21 DIAGNOSIS — Z4902 Encounter for fitting and adjustment of peritoneal dialysis catheter: Secondary | ICD-10-CM | POA: Insufficient documentation

## 2021-05-21 DIAGNOSIS — N2581 Secondary hyperparathyroidism of renal origin: Secondary | ICD-10-CM | POA: Insufficient documentation

## 2021-05-21 DIAGNOSIS — N186 End stage renal disease: Secondary | ICD-10-CM | POA: Diagnosis not present

## 2021-05-21 DIAGNOSIS — D631 Anemia in chronic kidney disease: Secondary | ICD-10-CM | POA: Diagnosis not present

## 2021-05-21 DIAGNOSIS — G4730 Sleep apnea, unspecified: Secondary | ICD-10-CM | POA: Diagnosis not present

## 2021-05-21 DIAGNOSIS — Z87891 Personal history of nicotine dependence: Secondary | ICD-10-CM | POA: Insufficient documentation

## 2021-05-21 DIAGNOSIS — R809 Proteinuria, unspecified: Secondary | ICD-10-CM | POA: Insufficient documentation

## 2021-05-21 DIAGNOSIS — Z992 Dependence on renal dialysis: Secondary | ICD-10-CM

## 2021-05-21 DIAGNOSIS — E1122 Type 2 diabetes mellitus with diabetic chronic kidney disease: Secondary | ICD-10-CM | POA: Diagnosis not present

## 2021-05-21 DIAGNOSIS — I120 Hypertensive chronic kidney disease with stage 5 chronic kidney disease or end stage renal disease: Secondary | ICD-10-CM | POA: Insufficient documentation

## 2021-05-21 HISTORY — PX: CAPD INSERTION: SHX5233

## 2021-05-21 LAB — POCT I-STAT, CHEM 8
BUN: 65 mg/dL — ABNORMAL HIGH (ref 8–23)
Calcium, Ion: 1.24 mmol/L (ref 1.15–1.40)
Chloride: 110 mmol/L (ref 98–111)
Creatinine, Ser: 5.4 mg/dL — ABNORMAL HIGH (ref 0.61–1.24)
Glucose, Bld: 99 mg/dL (ref 70–99)
HCT: 28 % — ABNORMAL LOW (ref 39.0–52.0)
Hemoglobin: 9.5 g/dL — ABNORMAL LOW (ref 13.0–17.0)
Potassium: 3.6 mmol/L (ref 3.5–5.1)
Sodium: 146 mmol/L — ABNORMAL HIGH (ref 135–145)
TCO2: 25 mmol/L (ref 22–32)

## 2021-05-21 SURGERY — LAPAROSCOPIC INSERTION CONTINUOUS AMBULATORY PERITONEAL DIALYSIS  (CAPD) CATHETER
Anesthesia: General

## 2021-05-21 MED ORDER — HYDROCODONE-ACETAMINOPHEN 5-325 MG PO TABS: 1.0000 | ORAL_TABLET | ORAL | 0 refills | Status: DC | PRN

## 2021-05-21 MED ORDER — EPHEDRINE SULFATE (PRESSORS) 50 MG/ML IJ SOLN
INTRAMUSCULAR | Status: DC | PRN
  Administered 2021-05-21: 2.5 mg via INTRAVENOUS

## 2021-05-21 MED ORDER — FENTANYL CITRATE (PF) 100 MCG/2ML IJ SOLN
INTRAMUSCULAR | Status: DC | PRN
  Administered 2021-05-21 (×2): 50 ug via INTRAVENOUS

## 2021-05-21 MED ORDER — GLYCOPYRROLATE 0.2 MG/ML IJ SOLN
INTRAMUSCULAR | Status: AC
  Filled 2021-05-21: qty 1

## 2021-05-21 MED ORDER — GABAPENTIN 300 MG PO CAPS
ORAL_CAPSULE | ORAL | Status: AC
  Administered 2021-05-21: 300 mg via ORAL
  Filled 2021-05-21: qty 1

## 2021-05-21 MED ORDER — OXYCODONE HCL 5 MG PO TABS
ORAL_TABLET | ORAL | Status: AC
  Administered 2021-05-21: 5 mg via ORAL
  Filled 2021-05-21: qty 1

## 2021-05-21 MED ORDER — DEXAMETHASONE SODIUM PHOSPHATE 10 MG/ML IJ SOLN
INTRAMUSCULAR | Status: DC | PRN
  Administered 2021-05-21: 10 mg via INTRAVENOUS

## 2021-05-21 MED ORDER — PROPOFOL 10 MG/ML IV BOLUS
INTRAVENOUS | Status: AC
  Filled 2021-05-21: qty 20

## 2021-05-21 MED ORDER — FENTANYL CITRATE (PF) 100 MCG/2ML IJ SOLN
INTRAMUSCULAR | Status: AC
  Administered 2021-05-21: 25 ug via INTRAVENOUS
  Filled 2021-05-21: qty 2

## 2021-05-21 MED ORDER — GLYCOPYRROLATE 0.2 MG/ML IJ SOLN
INTRAMUSCULAR | Status: DC | PRN
  Administered 2021-05-21 (×2): .2 mg via INTRAVENOUS

## 2021-05-21 MED ORDER — FAMOTIDINE 20 MG PO TABS
ORAL_TABLET | ORAL | Status: AC
  Administered 2021-05-21: 20 mg via ORAL
  Filled 2021-05-21: qty 1

## 2021-05-21 MED ORDER — HEPARIN SODIUM (PORCINE) 5000 UNIT/ML IJ SOLN
INTRAMUSCULAR | Status: AC
  Filled 2021-05-21: qty 2

## 2021-05-21 MED ORDER — DROPERIDOL 2.5 MG/ML IJ SOLN
0.6250 mg | Freq: Once | INTRAMUSCULAR | Status: DC | PRN
  Filled 2021-05-21: qty 2

## 2021-05-21 MED ORDER — ROCURONIUM BROMIDE 100 MG/10ML IV SOLN
INTRAVENOUS | Status: DC | PRN
Start: 2021-05-21 — End: 2021-05-21
  Administered 2021-05-21: 10 mg via INTRAVENOUS
  Administered 2021-05-21: 50 mg via INTRAVENOUS

## 2021-05-21 MED ORDER — CHLORHEXIDINE GLUCONATE 0.12 % MT SOLN
OROMUCOSAL | Status: AC
  Filled 2021-05-21: qty 15

## 2021-05-21 MED ORDER — ACETAMINOPHEN 10 MG/ML IV SOLN: 1000.0000 mg | Freq: Once | INTRAVENOUS | Status: DC | PRN

## 2021-05-21 MED ORDER — OXYCODONE HCL 5 MG PO TABS: 5.0000 mg | ORAL_TABLET | Freq: Once | ORAL | Status: AC | PRN

## 2021-05-21 MED ORDER — ACETAMINOPHEN 500 MG PO TABS
ORAL_TABLET | ORAL | Status: AC
  Administered 2021-05-21: 1000 mg via ORAL
  Filled 2021-05-21: qty 2

## 2021-05-21 MED ORDER — FENTANYL CITRATE (PF) 100 MCG/2ML IJ SOLN
25.0000 ug | INTRAMUSCULAR | Status: DC | PRN
  Administered 2021-05-21: 25 ug via INTRAVENOUS

## 2021-05-21 MED ORDER — SUGAMMADEX SODIUM 200 MG/2ML IV SOLN
INTRAVENOUS | Status: DC | PRN
  Administered 2021-05-21: 400 mg via INTRAVENOUS

## 2021-05-21 MED ORDER — BUPIVACAINE LIPOSOME 1.3 % IJ SUSP
INTRAMUSCULAR | Status: AC
  Filled 2021-05-21: qty 20

## 2021-05-21 MED ORDER — MIDAZOLAM HCL 2 MG/2ML IJ SOLN
INTRAMUSCULAR | Status: AC
  Filled 2021-05-21: qty 2

## 2021-05-21 MED ORDER — BUPIVACAINE-EPINEPHRINE (PF) 0.25% -1:200000 IJ SOLN
INTRAMUSCULAR | Status: AC
  Filled 2021-05-21: qty 30

## 2021-05-21 MED ORDER — BUPIVACAINE-EPINEPHRINE 0.25% -1:200000 IJ SOLN
INTRAMUSCULAR | Status: DC | PRN
  Administered 2021-05-21: 30 mL

## 2021-05-21 MED ORDER — FENTANYL CITRATE (PF) 100 MCG/2ML IJ SOLN
INTRAMUSCULAR | Status: AC
  Filled 2021-05-21: qty 2

## 2021-05-21 MED ORDER — ONDANSETRON HCL 4 MG/2ML IJ SOLN
INTRAMUSCULAR | Status: DC | PRN
  Administered 2021-05-21 (×2): 4 mg via INTRAVENOUS

## 2021-05-21 MED ORDER — PROPOFOL 10 MG/ML IV BOLUS
INTRAVENOUS | Status: DC | PRN
  Administered 2021-05-21: 120 mg via INTRAVENOUS

## 2021-05-21 MED ORDER — BUPIVACAINE LIPOSOME 1.3 % IJ SUSP
INTRAMUSCULAR | Status: DC | PRN
  Administered 2021-05-21: 20 mL

## 2021-05-21 MED ORDER — CEFAZOLIN SODIUM-DEXTROSE 2-4 GM/100ML-% IV SOLN
INTRAVENOUS | Status: AC
  Filled 2021-05-21: qty 100

## 2021-05-21 MED ORDER — LIDOCAINE HCL (CARDIAC) PF 100 MG/5ML IV SOSY
PREFILLED_SYRINGE | INTRAVENOUS | Status: DC | PRN
Start: 2021-05-21 — End: 2021-05-21
  Administered 2021-05-21: 100 mg via INTRAVENOUS

## 2021-05-21 MED ORDER — LIDOCAINE HCL (CARDIAC) PF 100 MG/5ML IV SOSY: PREFILLED_SYRINGE | INTRAVENOUS | Status: DC | PRN

## 2021-05-21 MED ORDER — OXYCODONE HCL 5 MG/5ML PO SOLN: 5.0000 mg | Freq: Once | ORAL | Status: AC | PRN

## 2021-05-21 SURGICAL SUPPLY — 53 items
ADAPTER CATH DIALYSIS 4X8 IT L (MISCELLANEOUS) ×1 IMPLANT
ADAPTER TITANIUM MEDIONICS (MISCELLANEOUS) ×2 IMPLANT
ADH SKN CLS APL DERMABOND .7 (GAUZE/BANDAGES/DRESSINGS) ×2
ADPR DLYS CATH STRL LF DISP (MISCELLANEOUS) ×1
BIOPATCH WHT 1IN DISK W/4.0 H (GAUZE/BANDAGES/DRESSINGS) ×1 IMPLANT
BLADE CLIPPER SURG (BLADE) ×1 IMPLANT
CATH EXTENDED DIALYSIS (CATHETERS) ×2 IMPLANT
DERMABOND ADVANCED (GAUZE/BANDAGES/DRESSINGS) ×2
DERMABOND ADVANCED .7 DNX12 (GAUZE/BANDAGES/DRESSINGS) ×1 IMPLANT
ELECT CAUTERY BLADE 6.4 (BLADE) ×2 IMPLANT
ELECT REM PT RETURN 9FT ADLT (ELECTROSURGICAL) ×2
ELECTRODE REM PT RTRN 9FT ADLT (ELECTROSURGICAL) ×1 IMPLANT
GLOVE SURG ENC MOIS LTX SZ7 (GLOVE) ×6 IMPLANT
GOWN STRL REUS W/ TWL LRG LVL3 (GOWN DISPOSABLE) ×2 IMPLANT
GOWN STRL REUS W/TWL LRG LVL3 (GOWN DISPOSABLE) ×6
GRASPER SUT TROCAR 14GX15 (MISCELLANEOUS) ×2 IMPLANT
IV NS 1000ML (IV SOLUTION) ×2
IV NS 1000ML BAXH (IV SOLUTION) ×1 IMPLANT
KIT TURNOVER KIT A (KITS) ×2 IMPLANT
MANIFOLD NEPTUNE II (INSTRUMENTS) ×2 IMPLANT
MINICAP W/POVIDONE IODINE SOL (MISCELLANEOUS) ×2 IMPLANT
NDL INSUFFLATION 14GA 120MM (NEEDLE) ×1 IMPLANT
NDL SAFETY ECLIPSE 18X1.5 (NEEDLE) ×1 IMPLANT
NEEDLE HYPO 18GX1.5 SHARP (NEEDLE) ×2
NEEDLE HYPO 22GX1.5 SAFETY (NEEDLE) ×2 IMPLANT
NEEDLE INSUFFLATION 14GA 120MM (NEEDLE) ×2 IMPLANT
NS IRRIG 500ML POUR BTL (IV SOLUTION) ×2 IMPLANT
PACK LAP CHOLECYSTECTOMY (MISCELLANEOUS) ×2 IMPLANT
PENCIL ELECTRO HAND CTR (MISCELLANEOUS) ×2 IMPLANT
SET CYSTO W/LG BORE CLAMP LF (SET/KITS/TRAYS/PACK) ×2 IMPLANT
SET TRANSFER 6 W/TWIST CLAMP 5 (SET/KITS/TRAYS/PACK) ×2 IMPLANT
SET TUBE SMOKE EVAC HIGH FLOW (TUBING) ×2 IMPLANT
SLEEVE ADV FIXATION 5X100MM (TROCAR) ×2 IMPLANT
SPONGE DRAIN TRACH 4X4 STRL 2S (GAUZE/BANDAGES/DRESSINGS) ×2 IMPLANT
SPONGE T-LAP 18X18 ~~LOC~~+RFID (SPONGE) ×2 IMPLANT
STYLET FALLER (MISCELLANEOUS) ×1 IMPLANT
STYLET FALLER MEDIONICS (MISCELLANEOUS) IMPLANT
SUT ETHILON 3-0 FS-10 30 BLK (SUTURE) ×2
SUT MNCRL 4-0 (SUTURE) ×2
SUT MNCRL 4-0 27XMFL (SUTURE) ×1
SUT VIC AB 3-0 SH 27 (SUTURE) ×2
SUT VIC AB 3-0 SH 27X BRD (SUTURE) ×1 IMPLANT
SUT VICRYL 0 AB UR-6 (SUTURE) ×4 IMPLANT
SUTURE EHLN 3-0 FS-10 30 BLK (SUTURE) ×1 IMPLANT
SUTURE MNCRL 4-0 27XMF (SUTURE) ×1 IMPLANT
SYR 20ML LL LF (SYRINGE) ×2 IMPLANT
SYR 3ML LL SCALE MARK (SYRINGE) ×2 IMPLANT
SYR TOOMEY IRRIG 70ML (MISCELLANEOUS)
SYRINGE TOOMEY IRRIG 70ML (MISCELLANEOUS) ×1 IMPLANT
SYS KII FIOS ACCESS ABD 5X100 (TROCAR) ×2
SYSTEM KII FIOS ACES ABD 5X100 (TROCAR) ×1 IMPLANT
TROCAR XCEL NON-BLD 5MMX100MML (ENDOMECHANICALS) ×2 IMPLANT
WATER STERILE IRR 500ML POUR (IV SOLUTION) ×1 IMPLANT

## 2021-05-21 NOTE — Op Note (Addendum)
Laparoscopic placement of peritoneal Dialysis catheter ( Total three cuffs) ?Laparoscopic Omentopexy ?  ?Pre-operative Diagnosis: ESRD ?  ?Post-operative Diagnosis: same ?  ?  ?Surgeon: Caroleen Hamman, MD FACS ?  ?Anesthesia: Gen. with endotracheal tube ?  ?   ?Findings: ?Catheter within pelvis ?Good return after infusing Peritoneal cavity ?Penile implant right extraperitoneal space , this catheter may impair fluid flow in the future. ?  ?Estimated Blood Loss: 5cc ?       ?      ?Complications: none ?  ?  ?Procedure Details  ?The patient was seen again in the Holding Room. The benefits, complications, treatment options, and expected outcomes were discussed with the patient. The risks of bleeding, infection, recurrence of symptoms, failure to resolve symptoms, catheter malfunction bowel injury, any of which could require further surgery were reviewed with the patient. The likelihood of improving the patient's symptoms with return to their baseline status is good.  The patient and/or family concurred with the proposed plan, giving informed consent.  The patient was taken to Operating Room, identified and the procedure verified. A Time Out was held and the above information confirmed. ?  ?Prior to the induction of general anesthesia, antibiotic prophylaxis was administered. VTE prophylaxis was in place. General endotracheal anesthesia was then administered and tolerated well. After the induction, the abdomen was prepped with Chloraprep and draped in the sterile fashion. The patient was positioned in the supine position. ?  ?Periumbilical incision created to the left of the midline.   The anterior rectus fascia identified and incised, rectus muscle identified and retracted laterally, using a port We were able to tunnel into the retro rectus space for about 6 cms. Peritoneum was pierced off the midline. ?Pneumoperitoneum was obtained w/o hemodynamic changes. We placed two additional laparoscopic ports under direct  visualization. ?  ?We were able to thread the catheter via the laparoscopic port within the retrorectus space in the standard fashion.  Under direct visualization we made sure that the coil portion of the catheter layed within the pelvis without any kinks.  Evidence of left extraperitoneal implant that can potentially interfere with the patency of the catheter. ( I did my best given circumstances) ?I was able to also perform a counterincision in the left subcostal area and Tailored an additional extension of the catheter . The  Extension had 2 cuffs and I was able to connect it to parse together with the titanium connector in the standard fashion.  There was no evidence of any kinks.  The exit site was to the left upper quadrant. ?We instilled heparinized saline a liter into the pelvis and we had very good return. ?He did have generous omentum, attention was then turned to the omentum and using a PMI device we were able to perform an omentopexy and tacked the omentum to the abdominal wall using 2 interrupted 2-0 Vicryl sutures in the standard fashion. ?All skin incisions  were infiltrated with a liposomal Marcaine. ?4-0 subcuticular Monocryl was used to close the skin. Dermabond was  applied. Sterile dressing applied to the catheter. The patient was then extubated and brought to the recovery room in stable condition. Sponge, lap, and needle counts were correct at closure and at the conclusion of the case.  ?        ?     ?Caroleen Hamman, MD, FACS ?  ?   ?
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
## 2021-05-21 NOTE — Transfer of Care (Signed)
Immediate Anesthesia Transfer of Care Note ? ?Patient: Luis Miller. ? ?Procedure(s) Performed: LAPAROSCOPIC INSERTION CONTINUOUS AMBULATORY PERITONEAL DIALYSIS  (CAPD) CATHETER ? ?Patient Location: PACU ? ?Anesthesia Type:General ? ?Level of Consciousness: awake, drowsy and patient cooperative ? ?Airway & Oxygen Therapy: Patient Spontanous Breathing and Patient connected to face mask oxygen ? ?Post-op Assessment: Report given to RN and Post -op Vital signs reviewed and stable ? ?Post vital signs: Reviewed and stable ? ?Last Vitals:  ?Vitals Value Taken Time  ?BP 146/76 05/21/21 1228  ?Temp    ?Pulse 86 05/21/21 1229  ?Resp 13 05/21/21 1229  ?SpO2 100 % 05/21/21 1229  ?Vitals shown include unvalidated device data. ? ?Last Pain:  ?Vitals:  ? 05/21/21 1005  ?TempSrc: Oral  ?PainSc: 0-No pain  ?   ? ?  ? ?Complications: No notable events documented. ?

## 2021-05-21 NOTE — Anesthesia Procedure Notes (Signed)
Procedure Name: Intubation ?Date/Time: 05/21/2021 11:23 AM ?Performed by: Kelton Pillar, CRNA ?Pre-anesthesia Checklist: Patient identified, Emergency Drugs available, Suction available and Patient being monitored ?Patient Re-evaluated:Patient Re-evaluated prior to induction ?Oxygen Delivery Method: Circle system utilized ?Preoxygenation: Pre-oxygenation with 100% oxygen ?Induction Type: IV induction ?Ventilation: Mask ventilation without difficulty ?Laryngoscope Size: McGraph and 3 ?Grade View: Grade I ?Tube type: Oral ?Number of attempts: 1 ?Airway Equipment and Method: Stylet and Oral airway ?Placement Confirmation: ETT inserted through vocal cords under direct vision, positive ETCO2, breath sounds checked- equal and bilateral and CO2 detector ?Secured at: 21 cm ?Tube secured with: Tape ?Dental Injury: Teeth and Oropharynx as per pre-operative assessment  ? ? ? ? ?

## 2021-05-21 NOTE — Discharge Instructions (Addendum)
Peritoneal Dialysis Catheter Placement, Care After ?The following information offers guidance on how to care for yourself after your procedure. Your health care provider may also give you more specific instructions. If you have problems or questions, contact your health care provider. ?What can I expect after the procedure? ?After the procedure, it is common to have some pain or discomfort in your abdomen and your incision area. ?You may need to wait 2 weeks after your procedure before you can start peritoneal dialysis treatment. If you need dialysis before that time, your health care provider may begin peritoneal dialysis treatment early or offer kidney dialysis treatments (hemodialysis) until you heal. ?Follow these instructions at home: ?Incision care ?Follow instructions from your health care provider about how to take care of your incision or incisions. Make sure you: ?Wash your hands with soap and water for at least 20 seconds before and after you change your bandage (dressing). If soap and water are not available, use hand sanitizer. ?Change your dressing only as told by your health care provider. Your health care provider may tell you not to touch or change your dressing. ?Leave stitches (sutures), staples, skin glue, or adhesive strips in place. These skin closures may need to stay in place for 2 weeks or longer. If adhesive strip edges start to loosen and curl up, you may trim the loose edges. Do not remove adhesive strips completely unless your health care provider tells you to do that. ?Check your incision areas every day for signs of infection. If you were instructed not to touch or change your dressing, look at your dressing for signs of infection. Check for: ?Redness, swelling, or more pain. ?Fluid or blood. ?Warmth. ?Pus or a bad smell. ?Medicines ?Take over-the-counter and prescription medicines only as told by your health care provider. ?If you were prescribed an antibiotic medicine, use it as told  by your health care provider. Do not stop using the antibiotic even if you start to feel better. ?Ask your health care provider if the medicine prescribed to you requires you to avoid driving or using machinery. ?Driving ?Do not drive or ride in a car until your health care provider approves. Your seat belt could move the catheter out of position or cause irritation by rubbing on your incision. ?Activity ?Rest and limit your activity. ?Do not lift anything that is heavier than 10 lb (4.5 kg), or the limit that you are told, until your health care provider says that it is safe. ?Return to your normal activities as told by your health care provider. Ask your health care provider what activities are safe for you. ?Managing constipation ?Your condition may cause constipation. To prevent or treat constipation, you may need to: ?Drink enough fluid to keep your urine pale yellow. ?Take over-the-counter or prescription medicines. ?Eat foods that are high in fiber, such as beans, whole grains, and fresh fruits and vegetables. ?Limit foods that are high in fat and processed sugars, such as fried or sweet foods. ?General instructions ?Do not use any products that contain nicotine or tobacco. These products include cigarettes, chewing tobacco, and vaping devices, such as e-cigarettes. If you need help quitting, ask your health care provider. ?Follow instructions from your health care provider about eating or drinking restrictions. ?Do not take baths, swim, or use a hot tub until your health care provider approves. Ask your health care provider if you may take showers. You may only be allowed to take sponge baths. ?Wear loose-fitting clothing that keeps the catheter  covered so that it cannot get caught on something. ?Keep your catheter clean and dry. ?Keep all follow-up visits. This is important. ?Contact a health care provider if: ?You have a fever or chills. ?You have warmth, redness, swelling, or more pain around an  incision. ?You have fluid or blood coming from an incision. ?You have pus or a bad smell coming from an incision. ?You cannot eat or drink without vomiting. ?Get help right away if: ?You have problems breathing. ?You are confused. ?You have trouble speaking. ?You have severe pain in your abdomen that does not get better with treatment. ?You have bright red blood in your stool (feces), or your stool is dark black and looks like tar. ?These symptoms may represent a serious problem that is an emergency. Do not wait to see if the symptoms will go away. Get medical help right away. Call your local emergency services (911 in the [place] Do not drive yourself to the hospital. ?Summary ?After the procedure, it is common to have some pain or discomfort in your abdomen, your incision area, or both. ?You may have to wait 2 weeks after your procedure before you can start peritoneal dialysis treatment. ?Check your incision area every day for signs of infection. ?Get medical help right away if you have severe pain in your abdomen that does not get better with treatment. ?This information is not intended to replace advice given to you by your health care provider. Make sure you discuss any questions you have with your health care provider. ?Document Revised: 10/25/2019 Document Reviewed: 10/25/2019 ?Elsevier Patient Education ? [place]. ?   ?AMBULATORY SURGERY  ?DISCHARGE INSTRUCTIONS ? ? ?The drugs that you were given will stay in your system until tomorrow so for the next 24 hours you should not: ? ?Drive an automobile ?Make any legal decisions ?Drink any alcoholic beverage ? ? ?You may resume regular meals tomorrow.  Today it is better to start with liquids and gradually work up to solid foods. ? ?You may eat anything you prefer, but it is better to start with liquids, then soup and crackers, and gradually work up to solid foods. ? ? ?Please notify your doctor immediately if you have any unusual bleeding, trouble  breathing, redness and pain at the surgery site, drainage, fever, or pain not relieved by medication. ? ? ? ?Additional Instructions: PLEASE LEAVE [place] FOR 5 DAYS ? ? ? ? ?Please contact your physician with any problems or Same Day Surgery at [phone], Monday through Friday 6 am to 4 pm, or [place] at [facility] number at [phone].  ?

## 2021-05-21 NOTE — Interval H&P Note (Signed)
History and Physical Interval Note: ? ?05/21/2021 ?9:55 AM ? ?Luis Miller.  has presented today for surgery, with the diagnosis of ESRD.  The various methods of treatment have been discussed with the patient and family. After consideration of risks, benefits and other options for treatment, the patient has consented to  Procedure(s) with comments: ?LAPAROSCOPIC INSERTION CONTINUOUS AMBULATORY PERITONEAL DIALYSIS  (CAPD) CATHETER (N/A) - Provider requesting 1.5 hours / 90 minutes for procedure. as a surgical intervention.  The patient's history has been reviewed, patient examined, no change in status, stable for surgery.  I have reviewed the patient's chart and labs.  Questions were answered to the patient's satisfaction.   ? ? ?[place] ? ? ?

## 2021-05-22 ENCOUNTER — Other Ambulatory Visit: Payer: Self-pay

## 2021-05-22 ENCOUNTER — Encounter: Payer: Self-pay | Admitting: Surgery

## 2021-05-22 NOTE — Anesthesia Postprocedure Evaluation (Signed)
Anesthesia Post Note ? ?Patient: Luis Miller. ? ?Procedure(s) Performed: LAPAROSCOPIC INSERTION CONTINUOUS AMBULATORY PERITONEAL DIALYSIS  (CAPD) CATHETER ? ?Patient location during evaluation: PACU ?Anesthesia Type: General ?Level of consciousness: awake and alert ?Pain management: pain level controlled ?Vital Signs Assessment: post-procedure vital signs reviewed and stable ?Respiratory status: spontaneous breathing, nonlabored ventilation and respiratory function stable ?Cardiovascular status: blood pressure returned to baseline and stable ?Postop Assessment: no apparent nausea or vomiting ?Anesthetic complications: no ? ? ?No notable events documented. ? ? ?Last Vitals:  ?Vitals:  ? 05/21/21 1345 05/21/21 1400  ?BP: 125/67 125/63  ?Pulse: 74 75  ?Resp: 18 18  ?Temp: 36.7 ?C   ?SpO2: 97% 98%  ?  ?Last Pain:  ?Vitals:  ? 05/21/21 1345  ?TempSrc:   ?PainSc: 2   ? ? ?  ?  ?  ?  ?  ?  ? ?[place] ? ? ? ? ?

## 2021-05-25 ENCOUNTER — Ambulatory Visit (INDEPENDENT_AMBULATORY_CARE_PROVIDER_SITE_OTHER): Payer: Medicare HMO | Admitting: Nurse Practitioner

## 2021-05-25 ENCOUNTER — Encounter: Payer: Self-pay | Admitting: Nurse Practitioner

## 2021-05-25 ENCOUNTER — Other Ambulatory Visit: Payer: Self-pay

## 2021-05-25 VITALS — BP 138/71 | HR 78 | Temp 98.7°F | Wt 196.0 lb

## 2021-05-25 DIAGNOSIS — R69 Illness, unspecified: Secondary | ICD-10-CM | POA: Diagnosis not present

## 2021-05-25 DIAGNOSIS — F3176 Bipolar disorder, in full remission, most recent episode depressed: Secondary | ICD-10-CM

## 2021-05-25 DIAGNOSIS — E232 Diabetes insipidus: Secondary | ICD-10-CM

## 2021-05-25 DIAGNOSIS — E1129 Type 2 diabetes mellitus with other diabetic kidney complication: Secondary | ICD-10-CM

## 2021-05-25 DIAGNOSIS — E039 Hypothyroidism, unspecified: Secondary | ICD-10-CM | POA: Diagnosis not present

## 2021-05-25 DIAGNOSIS — I10 Essential (primary) hypertension: Secondary | ICD-10-CM

## 2021-05-25 DIAGNOSIS — N170 Acute kidney failure with tubular necrosis: Secondary | ICD-10-CM

## 2021-05-25 DIAGNOSIS — N184 Chronic kidney disease, stage 4 (severe): Secondary | ICD-10-CM

## 2021-05-25 NOTE — Assessment & Plan Note (Signed)
Chronic. Has new PD catheter placed. Will begin learning how to use it in the next couple of weeks.  Continue to follow up with Nephrology.   ?

## 2021-05-25 NOTE — Assessment & Plan Note (Signed)
Chronic.  Controlled.  Continue with current medication regimen.  Labs ordered today.  Return to clinic in 6 months for reevaluation.  Call sooner if concerns arise.  ? ?

## 2021-05-25 NOTE — Assessment & Plan Note (Signed)
Stable.  Continues to follow up with Psychiatry.  Denies concerns at visit today.  ?

## 2021-05-25 NOTE — Assessment & Plan Note (Signed)
Patient states he does not have DM 2.  Will check A1c in office today. Will make recommendations based on lab results.  ?

## 2021-05-25 NOTE — Progress Notes (Signed)
? ?BP 138/71   Pulse 78   Temp 98.7 ?F (37.1 ?C) (Oral)   Wt 196 lb (88.9 kg)   SpO2 96%   BMI 31.16 kg/m?   ? ?Subjective:  ? ? Patient ID: Luis Coss., male    DOB: April 17, 1945, 76 y.o.   MRN: [id number] ? ?HPI: ?Luis Standing. is a 76 y.o. male ? ?Chief Complaint  ?Patient presents with  ? Diabetes  ? Hyperlipidemia  ? Hypertension  ? ?HYPERTENSION / HYPERLIPIDEMIA ?Satisfied with current treatment? no ?Duration of hypertension: years ?BP monitoring frequency: not checking ?BP range:  ?BP medication side effects: no ?Past BP meds:  hydralazine, amlodipine, and benazepril ?Duration of hyperlipidemia: years ?Cholesterol medication side effects: no ?Cholesterol supplements: none ?Past cholesterol medications: rosuvastatin (crestor) ?Medication compliance: excellent compliance ?Aspirin: no ?Recent stressors: no ?Recurrent headaches: no ?Visual changes: no ?Palpitations: no ?Dyspnea: no ?Chest pain: no ?Lower extremity edema: no ?Dizzy/lightheaded: no ? ?Patient states he had a peritoneal dialysis catheter placed on Thursday.  He has to be trained on how to use the machine and wills tart in a couple of weeks.   ? ? ?Relevant past medical, surgical, family and social history reviewed and updated as indicated. Interim medical history since our last visit reviewed. ?Allergies and medications reviewed and updated. ? ?Review of Systems  ?Eyes:  Negative for visual disturbance.  ?Respiratory:  Negative for chest tightness and shortness of breath.   ?Cardiovascular:  Negative for chest pain, palpitations and leg swelling.  ?Skin:   ?     New dialysis catheter  ?Neurological:  Negative for dizziness, light-headedness and headaches.  ? ?Per HPI unless specifically indicated above ? ?   ?Objective:  ?  ?BP 138/71   Pulse 78   Temp 98.7 ?F (37.1 ?C) (Oral)   Wt 196 lb (88.9 kg)   SpO2 96%   BMI 31.16 kg/m?   ?Wt Readings from Last 3 Encounters:  ?05/25/21 196 lb (88.9 kg)  ?05/21/21 195 lb 0.2 oz (88.5 kg)   ?05/15/21 195 lb (88.5 kg)  ?  ?Physical Exam ?Vitals and nursing note reviewed.  ?Constitutional:   ?   General: He is not in acute distress. ?   Appearance: Normal appearance. He is not ill-appearing, toxic-appearing or diaphoretic.  ?HENT:  ?   Head: Normocephalic.  ?   Right Ear: External ear normal.  ?   Left Ear: External ear normal.  ?   Nose: Nose normal. No congestion or rhinorrhea.  ?   Mouth/Throat:  ?   Mouth: Mucous membranes are moist.  ?Eyes:  ?   General:     ?   Right eye: No discharge.     ?   Left eye: No discharge.  ?   Extraocular Movements: Extraocular movements intact.  ?   Conjunctiva/sclera: Conjunctivae normal.  ?   Pupils: Pupils are equal, round, and reactive to light.  ?Cardiovascular:  ?   Rate and Rhythm: Normal rate and regular rhythm.  ?   Heart sounds: No murmur heard. ?Pulmonary:  ?   Effort: Pulmonary effort is normal. No respiratory distress.  ?   Breath sounds: Normal breath sounds. No wheezing, rhonchi or rales.  ?Abdominal:  ?   General: Abdomen is flat. Bowel sounds are normal.  ?Musculoskeletal:  ?   Cervical back: Normal range of motion and neck supple.  ?Skin: ?   General: Skin is warm and dry.  ?   Capillary Refill: Capillary refill  takes less than 2 seconds.  ?Neurological:  ?   General: No focal deficit present.  ?   Mental Status: He is alert and oriented to person, place, and time.  ?Psychiatric:     ?   Mood and Affect: Mood normal.     ?   Behavior: Behavior normal.     ?   Thought Content: Thought content normal.     ?   Judgment: Judgment normal.  ? ? ?Results for orders placed or performed during the hospital encounter of 05/21/21  ?I-STAT, chem 8  ?Result Value Ref Range  ? Sodium 146 (H) 135 - 145 mmol/L  ? Potassium 3.6 3.5 - 5.1 mmol/L  ? Chloride 110 98 - 111 mmol/L  ? BUN 65 (H) 8 - 23 mg/dL  ? Creatinine, Ser 5.40 (H) 0.61 - 1.24 mg/dL  ? Glucose, Bld 99 70 - 99 mg/dL  ? Calcium, Ion 1.24 1.15 - 1.40 mmol/L  ? TCO2 25 22 - 32 mmol/L  ? Hemoglobin 9.5 (L)  13.0 - 17.0 g/dL  ? HCT 28.0 (L) 39.0 - 52.0 %  ? ?   ?Assessment & Plan:  ? ?Problem List Items Addressed This Visit   ? ?  ? Cardiovascular and Mediastinum  ? Essential hypertension  ?  Chronic.  Controlled.  Continue with current medication regimen.  Labs ordered today.  Return to clinic in 6 months for reevaluation.  Call sooner if concerns arise.  ? ?  ?  ?  ? Endocrine  ? 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 Relevant Orders  ? Comp Met (CMET)  ? Type 2 diabetes mellitus with other diabetic kidney complication (HCC)  ?  Patient states he does not have DM 2.  Will check A1c in office today. Will make recommendations based on lab results.  ?  ?  ? Relevant Orders  ? HgB A1c  ?  ? Genitourinary  ? CKD (chronic kidney disease) stage 4, GFR 15-29 ml/min (HCC)  ?  Chronic. Has new PD catheter placed. Will begin learning how to use it in the next couple of weeks.  Continue to follow up with Nephrology.   ?  ?  ? Acute renal failure superimposed on stage 4 chronic kidney disease ([place])  ?  Chronic. Has new PD catheter placed. Will begin learning how to use it in the next couple of weeks.  Continue to follow up with Nephrology.   ?  ?  ?  ? Other  ? Bipolar disorder, in full remission, most recent episode depressed ([place])  ?  Stable.  Continues to follow up with Psychiatry.  Denies concerns at visit today.  ?  ?  ? ?Other Visit Diagnoses   ? ? Hypothyroidism, unspecified type      ? Followed by Dr. Gabriel Carina.  TSH and T4 drawn in office today. Will make recommendations based on lab results.  ? Relevant Orders  ? TSH  ? T4, free  ? ?  ?  ? ?Follow up plan: ?Return in about 6 months (around 11/25/2021) for Physical and Fasting labs. ? ? ? ? ? ? ?

## 2021-05-26 DIAGNOSIS — D631 Anemia in chronic kidney disease: Secondary | ICD-10-CM | POA: Diagnosis not present

## 2021-05-26 DIAGNOSIS — R809 Proteinuria, unspecified: Secondary | ICD-10-CM | POA: Diagnosis not present

## 2021-05-26 DIAGNOSIS — I10 Essential (primary) hypertension: Secondary | ICD-10-CM | POA: Diagnosis not present

## 2021-05-26 DIAGNOSIS — N185 Chronic kidney disease, stage 5: Secondary | ICD-10-CM | POA: Diagnosis not present

## 2021-05-26 DIAGNOSIS — N2581 Secondary hyperparathyroidism of renal origin: Secondary | ICD-10-CM | POA: Diagnosis not present

## 2021-05-26 LAB — HEMOGLOBIN A1C
Est. average glucose Bld gHb Est-mCnc: 117 mg/dL
Hgb A1c MFr Bld: 5.7 % — ABNORMAL HIGH (ref 4.8–5.6)

## 2021-05-26 LAB — COMPREHENSIVE METABOLIC PANEL
ALT: 8 IU/L (ref 0–44)
AST: 24 IU/L (ref 0–40)
Albumin/Globulin Ratio: 1.5 (ref 1.2–2.2)
Albumin: 3.7 g/dL (ref 3.7–4.7)
Alkaline Phosphatase: 64 IU/L (ref 44–121)
BUN/Creatinine Ratio: 13 (ref 10–24)
BUN: 63 mg/dL — ABNORMAL HIGH (ref 8–27)
Bilirubin Total: 0.4 mg/dL (ref 0.0–1.2)
CO2: 21 mmol/L (ref 20–29)
Calcium: 9.1 mg/dL (ref 8.6–10.2)
Chloride: 108 mmol/L — ABNORMAL HIGH (ref 96–106)
Creatinine, Ser: 4.81 mg/dL — ABNORMAL HIGH (ref 0.76–1.27)
Globulin, Total: 2.5 g/dL (ref 1.5–4.5)
Glucose: 81 mg/dL (ref 70–99)
Potassium: 3.9 mmol/L (ref 3.5–5.2)
Sodium: 146 mmol/L — ABNORMAL HIGH (ref 134–144)
Total Protein: 6.2 g/dL (ref 6.0–8.5)
eGFR: 12 mL/min/{1.73_m2} — ABNORMAL LOW (ref >59)

## 2021-05-26 LAB — TSH: TSH: 0.06 u[IU]/mL — ABNORMAL LOW (ref 0.450–4.500)

## 2021-05-26 LAB — T4, FREE: Free T4: 1.83 ng/dL — ABNORMAL HIGH (ref 0.82–1.77)

## 2021-05-26 NOTE — Progress Notes (Signed)
HI Luis Miller.  It was good to see you yesterday.  Your lab work shows that your kidney function is stable.  Continue to follow up with the Nephrologist.  Your A1c which is the measure of diabetes shows that you are prediabetic.  We will continue to monitor this in the future.  No medications at this time.  I recommend you follow a low carb diet.  Your thyroid labs are abnormal.  I recommend continuing to follow up with Dr. Gabriel Carina.  Please let me know if you have any questions.

## 2021-05-29 DIAGNOSIS — E0590 Thyrotoxicosis, unspecified without thyrotoxic crisis or storm: Secondary | ICD-10-CM | POA: Diagnosis not present

## 2021-06-01 DIAGNOSIS — G4733 Obstructive sleep apnea (adult) (pediatric): Secondary | ICD-10-CM | POA: Diagnosis not present

## 2021-06-02 DIAGNOSIS — E0590 Thyrotoxicosis, unspecified without thyrotoxic crisis or storm: Secondary | ICD-10-CM | POA: Diagnosis not present

## 2021-06-02 DIAGNOSIS — E042 Nontoxic multinodular goiter: Secondary | ICD-10-CM | POA: Diagnosis not present

## 2021-06-04 ENCOUNTER — Ambulatory Visit (INDEPENDENT_AMBULATORY_CARE_PROVIDER_SITE_OTHER): Payer: Medicare HMO | Admitting: Psychiatry

## 2021-06-04 ENCOUNTER — Encounter: Payer: Self-pay | Admitting: Psychiatry

## 2021-06-04 ENCOUNTER — Other Ambulatory Visit: Payer: Self-pay

## 2021-06-04 VITALS — BP 161/79 | HR 70 | Temp 98.8°F | Wt 198.2 lb

## 2021-06-04 DIAGNOSIS — G4701 Insomnia due to medical condition: Secondary | ICD-10-CM

## 2021-06-04 DIAGNOSIS — R69 Illness, unspecified: Secondary | ICD-10-CM | POA: Diagnosis not present

## 2021-06-04 DIAGNOSIS — F3176 Bipolar disorder, in full remission, most recent episode depressed: Secondary | ICD-10-CM | POA: Diagnosis not present
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
## 2021-06-10 ENCOUNTER — Ambulatory Visit (INDEPENDENT_AMBULATORY_CARE_PROVIDER_SITE_OTHER): Payer: Medicare HMO | Admitting: Surgery

## 2021-06-10 ENCOUNTER — Other Ambulatory Visit: Payer: Self-pay

## 2021-06-10 ENCOUNTER — Encounter: Payer: Self-pay | Admitting: Surgery

## 2021-06-10 VITALS — BP 147/75 | HR 82 | Temp 98.5°F | Ht 66.0 in | Wt 196.2 lb

## 2021-06-10 DIAGNOSIS — Z09 Encounter for follow-up examination after completed treatment for conditions other than malignant neoplasm: Secondary | ICD-10-CM

## 2021-06-10 DIAGNOSIS — Z992 Dependence on renal dialysis: Secondary | ICD-10-CM

## 2021-06-10 DIAGNOSIS — N186 End stage renal disease: Secondary | ICD-10-CM

## 2021-06-10 NOTE — Patient Instructions (Signed)
If you have any concerns or questions, please feel free to call our office. Follow up as needed.  ? ?Peritoneal Dialysis Catheter Placement, Care After ?The following information offers guidance on how to care for yourself after your procedure. Your health care provider may also give you more specific instructions. If you have problems or questions, contact your health care provider. ?What can I expect after the procedure? ?After the procedure, it is common to have some pain or discomfort in your abdomen and your incision area. ?You may need to wait 2 weeks after your procedure before you can start peritoneal dialysis treatment. If you need dialysis before that time, your health care provider may begin peritoneal dialysis treatment early or offer kidney dialysis treatments (hemodialysis) until you heal. ?Follow these instructions at home: ?Incision care ? ?Follow instructions from your health care provider about how to take care of your incision or incisions. Make sure you: ?Wash your hands with soap and water for at least 20 seconds before and after you change your bandage (dressing). If soap and water are not available, use hand sanitizer. ?Change your dressing only as told by your health care provider. Your health care provider may tell you not to touch or change your dressing. ?Leave stitches (sutures), staples, skin glue, or adhesive strips in place. These skin closures may need to stay in place for 2 weeks or longer. If adhesive strip edges start to loosen and curl up, you may trim the loose edges. Do not remove adhesive strips completely unless your health care provider tells you to do that. ?Check your incision areas every day for signs of infection. If you were instructed not to touch or change your dressing, look at your dressing for signs of infection. Check for: ?Redness, swelling, or more pain. ?Fluid or blood. ?Warmth. ?Pus or a bad smell. ?Medicines ?Take over-the-counter and prescription medicines  only as told by your health care provider. ?If you were prescribed an antibiotic medicine, use it as told by your health care provider. Do not stop using the antibiotic even if you start to feel better. ?Ask your health care provider if the medicine prescribed to you requires you to avoid driving or using machinery. ?Driving ?Do not drive or ride in a car until your health care provider approves. Your seat belt could move the catheter out of position or cause irritation by rubbing on your incision. ?Activity ? ?Rest and limit your activity. ?Do not lift anything that is heavier than 10 lb (4.5 kg), or the limit that you are told, until your health care provider says that it is safe. ?Return to your normal activities as told by your health care provider. Ask your health care provider what activities are safe for you. ?Managing constipation ?Your condition may cause constipation. To prevent or treat constipation, you may need to: ?Drink enough fluid to keep your urine pale yellow. ?Take over-the-counter or prescription medicines. ?Eat foods that are high in fiber, such as beans, whole grains, and fresh fruits and vegetables. ?Limit foods that are high in fat and processed sugars, such as fried or sweet foods. ?General instructions ?Do not use any products that contain nicotine or tobacco. These products include cigarettes, chewing tobacco, and vaping devices, such as e-cigarettes. If you need help quitting, ask your health care provider. ?Follow instructions from your health care provider about eating or drinking restrictions. ?Do not take baths, swim, or use a hot tub until your health care provider approves. Ask your health care  provider if you may take showers. You may only be allowed to take sponge baths. ?Wear loose-fitting clothing that keeps the catheter covered so that it cannot get caught on something. ?Keep your catheter clean and dry. ?Keep all follow-up visits. This is important. ?Contact a health care  provider if: ?You have a fever or chills. ?You have warmth, redness, swelling, or more pain around an incision. ?You have fluid or blood coming from an incision. ?You have pus or a bad smell coming from an incision. ?You cannot eat or drink without vomiting. ?Get help right away if: ?You have problems breathing. ?You are confused. ?You have trouble speaking. ?You have severe pain in your abdomen that does not get better with treatment. ?You have bright red blood in your stool (feces), or your stool is dark black and looks like tar. ?These symptoms may represent a serious problem that is an emergency. Do not wait to see if the symptoms will go away. Get medical help right away. Call your local emergency services (911 in the [place] Do not drive yourself to the hospital. ?Summary ?After the procedure, it is common to have some pain or discomfort in your abdomen, your incision area, or both. ?You may have to wait 2 weeks after your procedure before you can start peritoneal dialysis treatment. ?Check your incision area every day for signs of infection. ?Get medical help right away if you have severe pain in your abdomen that does not get better with treatment. ?This information is not intended to replace advice given to you by your health care provider. Make sure you discuss any questions you have with your health care provider. ?Document Revised: 10/25/2019 Document Reviewed: 10/25/2019 ?Elsevier Patient Education ? [place]. ? ?

## 2021-06-11 ENCOUNTER — Encounter: Payer: Self-pay | Admitting: Nurse Practitioner

## 2021-06-11 ENCOUNTER — Ambulatory Visit (INDEPENDENT_AMBULATORY_CARE_PROVIDER_SITE_OTHER): Payer: Medicare HMO | Admitting: Nurse Practitioner

## 2021-06-11 DIAGNOSIS — Z712 Person consulting for explanation of examination or test findings: Secondary | ICD-10-CM | POA: Diagnosis not present

## 2021-06-11 NOTE — Progress Notes (Signed)
? ?There were no vitals taken for this visit.  ? ?Subjective:  ? ? Patient ID: Luis Miller., male    DOB: 09/09/1945, 76 y.o.   MRN: [id number] ? ?HPI: ?Luis Miller. is a 76 y.o. male ? ?Chief Complaint  ?Patient presents with  ? Lab Results  ?  Pt states he would like to discuss his most recent lab results. States he read he was a prediabetic and was alarmed.   ? ?Patient states his blood work showed that he is prediabetic.  He would like to know what a low carb diet.  ? ? Denies HA, CP, SOB, dizziness, palpitations, visual changes, and lower extremity swelling. ? ? ?Relevant past medical, surgical, family and social history reviewed and updated as indicated. Interim medical history since our last visit reviewed. ?Allergies and medications reviewed and updated. ? ?Review of Systems  ?Eyes:  Negative for visual disturbance.  ?Respiratory:  Negative for shortness of breath.   ?Cardiovascular:  Negative for chest pain and leg swelling.  ?Neurological:  Negative for light-headedness and headaches.  ? ?Per HPI unless specifically indicated above ? ?   ?Objective:  ?  ?There were no vitals taken for this visit.  ?Wt Readings from Last 3 Encounters:  ?06/10/21 196 lb 3.2 oz (89 kg)  ?05/25/21 196 lb (88.9 kg)  ?05/21/21 195 lb 0.2 oz (88.5 kg)  ?  ?Physical Exam ?Vitals and nursing note reviewed.  ?HENT:  ?   Right Ear: Hearing normal.  ?   Left Ear: Hearing normal.  ?Pulmonary:  ?   Effort: Pulmonary effort is normal. No respiratory distress.  ?Neurological:  ?   Mental Status: He is alert.  ?Psychiatric:     ?   Mood and Affect: Mood normal.     ?   Behavior: Behavior normal.     ?   Thought Content: Thought content normal.     ?   Judgment: Judgment normal.  ? ? ?Results for orders placed or performed in visit on 05/25/21  ?Comp Met (CMET)  ?Result Value Ref Range  ? Glucose 81 70 - 99 mg/dL  ? BUN 63 (H) 8 - 27 mg/dL  ? Creatinine, Ser 4.81 (H) 0.76 - 1.27 mg/dL  ? eGFR 12 (L) >59 mL/min/1.73  ?  BUN/Creatinine Ratio 13 10 - 24  ? Sodium 146 (H) 134 - 144 mmol/L  ? Potassium 3.9 3.5 - 5.2 mmol/L  ? Chloride 108 (H) 96 - 106 mmol/L  ? CO2 21 20 - 29 mmol/L  ? Calcium 9.1 8.6 - 10.2 mg/dL  ? Total Protein 6.2 6.0 - 8.5 g/dL  ? Albumin 3.7 3.7 - 4.7 g/dL  ? Globulin, Total 2.5 1.5 - 4.5 g/dL  ? Albumin/Globulin Ratio 1.5 1.2 - 2.2  ? Bilirubin Total 0.4 0.0 - 1.2 mg/dL  ? Alkaline Phosphatase 64 44 - 121 IU/L  ? AST 24 0 - 40 IU/L  ? ALT 8 0 - 44 IU/L  ?HgB A1c  ?Result Value Ref Range  ? Hgb A1c MFr Bld 5.7 (H) 4.8 - 5.6 %  ? Est. average glucose Bld gHb Est-mCnc 117 mg/dL  ?TSH  ?Result Value Ref Range  ? TSH 0.060 (L) 0.450 - 4.500 uIU/mL  ?T4, free  ?Result Value Ref Range  ? Free T4 1.83 (H) 0.82 - 1.77 ng/dL  ? ?   ?Assessment & Plan:  ? ?Problem List Items Addressed This Visit   ?None ?Visit Diagnoses   ? ?  Encounter to discuss test results    -  Primary  ? Most recent results discussed with patient during visit. Reviewed low carb diet. Will send information on diabetic diet to patient via mail.   ? ?  ?  ? ?Follow up plan: ?Return if symptoms worsen or fail to improve. ? ? ?This visit was completed via MyChart due to the restrictions of the COVID-19 pandemic. All issues as above were discussed and addressed. Physical exam was done as above through visual confirmation on MyChart. If it was felt that the patient should be evaluated in the office, they were directed there. The patient verbally consented to this visit. ?Location of the patient: Home ?Location of the provider: Office ?Those involved with this call:  ?Provider:  , NP ?CMA: Brittany Russell,CMA ?Front Desk/Registration: Iris Parrish ?This encounter was conducted via telephone.  I spent 21 dedicated to the care of this patient on the date of this encounter to include previsit review of reviewing lab results, low carb diet recommendations and plan of care for prediabetes., face to face time with the patient, and post visit  ordering of testing.  ? ? ? ? ?

## 2021-06-12 NOTE — Progress Notes (Signed)
[place] 3 weeks out from laparoscopic PD catheter placement.  Doing very well without complications.  He is starting flushes.  No fevers no chills taking p.o. ? ?PE NAD ?Abd: soft, nt, incisions c.d.I, catheter in place ? ? ?A./P doing well ?No complications ?RTC prn ? ?

## 2021-06-15 DIAGNOSIS — Z992 Dependence on renal dialysis: Secondary | ICD-10-CM | POA: Diagnosis not present

## 2021-06-15 DIAGNOSIS — N186 End stage renal disease: Secondary | ICD-10-CM | POA: Diagnosis not present

## 2021-06-16 ENCOUNTER — Telehealth: Payer: Self-pay | Admitting: *Deleted

## 2021-06-16 DIAGNOSIS — N186 End stage renal disease: Secondary | ICD-10-CM | POA: Diagnosis not present

## 2021-06-16 DIAGNOSIS — Z992 Dependence on renal dialysis: Secondary | ICD-10-CM | POA: Diagnosis not present

## 2021-06-16 NOTE — Telephone Encounter (Signed)
Vm left on Triage line regarding receiving a bill. States his insurance was not filed but card was given on 04/13/21. Please advise ?

## 2021-06-17 ENCOUNTER — Other Ambulatory Visit: Payer: Self-pay | Admitting: *Deleted

## 2021-06-17 DIAGNOSIS — N186 End stage renal disease: Secondary | ICD-10-CM | POA: Diagnosis not present

## 2021-06-17 DIAGNOSIS — Z992 Dependence on renal dialysis: Secondary | ICD-10-CM | POA: Diagnosis not present

## 2021-06-18 DIAGNOSIS — N186 End stage renal disease: Secondary | ICD-10-CM | POA: Diagnosis not present

## 2021-06-18 DIAGNOSIS — Z992 Dependence on renal dialysis: Secondary | ICD-10-CM | POA: Diagnosis not present

## 2021-06-22 DIAGNOSIS — N186 End stage renal disease: Secondary | ICD-10-CM | POA: Diagnosis not present

## 2021-06-22 DIAGNOSIS — Z298 Encounter for other specified prophylactic measures: Secondary | ICD-10-CM | POA: Diagnosis not present

## 2021-06-22 DIAGNOSIS — Z992 Dependence on renal dialysis: Secondary | ICD-10-CM | POA: Diagnosis not present

## 2021-06-23 DIAGNOSIS — N186 End stage renal disease: Secondary | ICD-10-CM | POA: Diagnosis not present

## 2021-06-23 DIAGNOSIS — Z992 Dependence on renal dialysis: Secondary | ICD-10-CM | POA: Diagnosis not present

## 2021-06-23 DIAGNOSIS — Z298 Encounter for other specified prophylactic measures: Secondary | ICD-10-CM | POA: Diagnosis not present

## 2021-06-25 DIAGNOSIS — Z992 Dependence on renal dialysis: Secondary | ICD-10-CM | POA: Diagnosis not present

## 2021-06-25 DIAGNOSIS — Z298 Encounter for other specified prophylactic measures: Secondary | ICD-10-CM | POA: Diagnosis not present

## 2021-06-25 DIAGNOSIS — N186 End stage renal disease: Secondary | ICD-10-CM | POA: Diagnosis not present

## 2021-07-01 DIAGNOSIS — N186 End stage renal disease: Secondary | ICD-10-CM | POA: Diagnosis not present

## 2021-07-01 DIAGNOSIS — Z992 Dependence on renal dialysis: Secondary | ICD-10-CM | POA: Diagnosis not present

## 2021-07-01 DIAGNOSIS — Z298 Encounter for other specified prophylactic measures: Secondary | ICD-10-CM | POA: Diagnosis not present

## 2021-07-02 DIAGNOSIS — G4733 Obstructive sleep apnea (adult) (pediatric): Secondary | ICD-10-CM | POA: Diagnosis not present

## 2021-07-04 ENCOUNTER — Other Ambulatory Visit: Payer: Self-pay | Admitting: Nurse Practitioner

## 2021-07-04 DIAGNOSIS — I10 Essential (primary) hypertension: Secondary | ICD-10-CM

## 2021-07-06 DIAGNOSIS — Z992 Dependence on renal dialysis: Secondary | ICD-10-CM | POA: Diagnosis not present

## 2021-07-06 DIAGNOSIS — N186 End stage renal disease: Secondary | ICD-10-CM | POA: Diagnosis not present

## 2021-07-06 DIAGNOSIS — Z298 Encounter for other specified prophylactic measures: Secondary | ICD-10-CM | POA: Diagnosis not present

## 2021-07-06 NOTE — Telephone Encounter (Signed)
. ?  Requested Prescriptions  ?Pending Prescriptions Disp Refills  ?? amLODipine (NORVASC) 10 MG tablet [Pharmacy Med Name: AMLODIPINE TAB 10MG] 90 tablet 1  ?  Sig: TAKE 1 TABLET DAILY  ?  ? Cardiovascular: Calcium Channel Blockers 2 Failed - 07/04/2021  8:13 AM  ?  ?  Failed - Last BP in normal range  ?  BP Readings from Last 1 Encounters:  ?06/10/21 (!) 147/75  ?   ?  ?  Passed - Last Heart Rate in normal range  ?  Pulse Readings from Last 1 Encounters:  ?06/10/21 82  ?   ?  ?  Passed - Valid encounter within last 6 months  ?  Recent Outpatient Visits   ?      ? 3 weeks ago Encounter to discuss test results  ? [place], NP  ? 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)  ? [place], NP  ? 1 month ago Nasal congestion  ? [place], Megan P, DO  ? 5 months ago OSA (obstructive sleep apnea)  ? [place], NP  ? 7 months ago Mixed hyperlipidemia  ? [facility] Jon Billings, NP  ?  ?  ?Future Appointments   ?        ? In 3 months [place], Gordan Payment [place]  ? In 4 months Jon Billings, NP [facility], [place]  ? In 5 months  [place], [facility]  ?  ? ?  ?  ?  ? ? ?

## 2021-07-07 DIAGNOSIS — Z992 Dependence on renal dialysis: Secondary | ICD-10-CM | POA: Diagnosis not present

## 2021-07-07 DIAGNOSIS — N186 End stage renal disease: Secondary | ICD-10-CM | POA: Diagnosis not present

## 2021-07-07 DIAGNOSIS — Z298 Encounter for other specified prophylactic measures: Secondary | ICD-10-CM | POA: Diagnosis not present

## 2021-07-08 DIAGNOSIS — Z992 Dependence on renal dialysis: Secondary | ICD-10-CM | POA: Diagnosis not present

## 2021-07-08 DIAGNOSIS — N186 End stage renal disease: Secondary | ICD-10-CM | POA: Diagnosis not present

## 2021-07-08 DIAGNOSIS — Z298 Encounter for other specified prophylactic measures: Secondary | ICD-10-CM | POA: Diagnosis not present

## 2021-07-09 ENCOUNTER — Telehealth: Payer: Self-pay | Admitting: Nurse Practitioner

## 2021-07-09 NOTE — Telephone Encounter (Signed)
Copied from [place] [id number]. Topic: General - Inquiry ?>> Jul 09, 2021  8:35 AM Oneta Rack wrote: ?Reason for CRM: patient received a missed call regarding following up with PCP regarding his C PAP and insurance covering his  C PAP. Patient unsure if the call came from the office, patient requesting a follow up call. ?

## 2021-07-09 NOTE — Telephone Encounter (Signed)
Copied from [place] [id number]. Topic: General - Other ?>> Jul 09, 2021 11:10 AM Yvette Rack wrote: ?Reason for CRM: [facility] with [employer] [place] called to ask if authorization for c-pap machine was submitted. Brayton Layman stated normally authorization is needed for the insurance to cover. Cb# [phone] ?

## 2021-07-09 NOTE — Telephone Encounter (Signed)
Tried [employer] back. Automated system kept hanging up. Will try to call again.  ? ?No sure what patient or the insurance company needs. A form was received from [place] stating that this patient needs to be seen between certain date, which is scheduled, to discuss and document CPAP usage and benefits.  ?

## 2021-07-13 DIAGNOSIS — N2581 Secondary hyperparathyroidism of renal origin: Secondary | ICD-10-CM | POA: Diagnosis not present

## 2021-07-13 DIAGNOSIS — N185 Chronic kidney disease, stage 5: Secondary | ICD-10-CM | POA: Diagnosis not present

## 2021-07-13 DIAGNOSIS — D631 Anemia in chronic kidney disease: Secondary | ICD-10-CM | POA: Diagnosis not present

## 2021-07-13 DIAGNOSIS — I10 Essential (primary) hypertension: Secondary | ICD-10-CM | POA: Diagnosis not present

## 2021-07-14 ENCOUNTER — Telehealth (INDEPENDENT_AMBULATORY_CARE_PROVIDER_SITE_OTHER): Payer: Self-pay

## 2021-07-14 NOTE — Telephone Encounter (Signed)
Spoke with the patient and he is scheduled with Dr. Lucky Cowboy for a permcath insertion on 07/16/21 with a 9:45 am arrival time to the [facility]. Pre-procedure instructions were discussed and patient stated he understood. ?

## 2021-07-16 ENCOUNTER — Encounter: Payer: Self-pay | Admitting: Vascular Surgery

## 2021-07-16 ENCOUNTER — Ambulatory Visit
Admission: RE | Admit: 2021-07-16 | Discharge: 2021-07-16 | Disposition: A | Discharge status: Home / Self Care | Payer: Medicare HMO | Source: Ambulatory Visit | Attending: Vascular Surgery | Admitting: Vascular Surgery

## 2021-07-16 ENCOUNTER — Encounter
Admission: RE | Disposition: A | Discharge status: Home / Self Care | Payer: Self-pay | Source: Ambulatory Visit | Attending: Vascular Surgery

## 2021-07-16 DIAGNOSIS — Z87891 Personal history of nicotine dependence: Secondary | ICD-10-CM | POA: Diagnosis not present

## 2021-07-16 DIAGNOSIS — K5090 Crohn's disease, unspecified, without complications: Secondary | ICD-10-CM | POA: Diagnosis not present

## 2021-07-16 DIAGNOSIS — N185 Chronic kidney disease, stage 5: Secondary | ICD-10-CM

## 2021-07-16 DIAGNOSIS — I120 Hypertensive chronic kidney disease with stage 5 chronic kidney disease or end stage renal disease: Secondary | ICD-10-CM | POA: Diagnosis not present

## 2021-07-16 DIAGNOSIS — N186 End stage renal disease: Secondary | ICD-10-CM | POA: Insufficient documentation

## 2021-07-16 DIAGNOSIS — E1122 Type 2 diabetes mellitus with diabetic chronic kidney disease: Secondary | ICD-10-CM | POA: Diagnosis not present

## 2021-07-16 HISTORY — PX: DIALYSIS/PERMA CATHETER INSERTION: CATH118288

## 2021-07-16 LAB — POTASSIUM (ARMC VASCULAR LAB ONLY): Potassium (ARMC vascular lab): 3.6 mmol/L (ref 3.5–5.1)

## 2021-07-16 SURGERY — DIALYSIS/PERMA CATHETER INSERTION
Anesthesia: Moderate Sedation

## 2021-07-16 MED ORDER — DIPHENHYDRAMINE HCL 50 MG/ML IJ SOLN
50.0000 mg | Freq: Once | INTRAMUSCULAR | Status: DC | PRN
Start: 2021-07-16 — End: 2021-07-16

## 2021-07-16 MED ORDER — SODIUM CHLORIDE 0.9 % IV SOLN: INTRAVENOUS | Status: DC

## 2021-07-16 MED ORDER — ONDANSETRON HCL 4 MG/2ML IJ SOLN
4.0000 mg | Freq: Four times a day (QID) | INTRAMUSCULAR | Status: DC | PRN
Start: 2021-07-16 — End: 2021-07-16

## 2021-07-16 MED ORDER — CEFAZOLIN SODIUM-DEXTROSE 1-4 GM/50ML-% IV SOLN: 1.0000 g | INTRAVENOUS | Status: AC

## 2021-07-16 MED ORDER — MIDAZOLAM HCL 2 MG/2ML IJ SOLN
INTRAMUSCULAR | Status: AC
  Filled 2021-07-16: qty 4

## 2021-07-16 MED ORDER — METHYLPREDNISOLONE SODIUM SUCC 125 MG IJ SOLR: 125.0000 mg | Freq: Once | INTRAMUSCULAR | Status: DC | PRN

## 2021-07-16 MED ORDER — FENTANYL CITRATE (PF) 100 MCG/2ML IJ SOLN
INTRAMUSCULAR | Status: DC | PRN
  Administered 2021-07-16: 50 ug via INTRAVENOUS

## 2021-07-16 MED ORDER — MIDAZOLAM HCL 2 MG/2ML IJ SOLN
INTRAMUSCULAR | Status: DC | PRN
Start: 2021-07-16 — End: 2021-07-16
  Administered 2021-07-16: 1 mg via INTRAVENOUS

## 2021-07-16 MED ORDER — CEFAZOLIN SODIUM-DEXTROSE 1-4 GM/50ML-% IV SOLN
INTRAVENOUS | Status: AC
  Administered 2021-07-16: 1 g via INTRAVENOUS
  Filled 2021-07-16: qty 50

## 2021-07-16 MED ORDER — FENTANYL CITRATE PF 50 MCG/ML IJ SOSY
PREFILLED_SYRINGE | INTRAMUSCULAR | Status: DC
Start: 2021-07-16 — End: 2021-07-16
  Filled 2021-07-16: qty 2

## 2021-07-16 MED ORDER — FAMOTIDINE 20 MG PO TABS: 40.0000 mg | ORAL_TABLET | Freq: Once | ORAL | Status: DC | PRN

## 2021-07-16 MED ORDER — HYDROMORPHONE HCL 1 MG/ML IJ SOLN: 1.0000 mg | Freq: Once | INTRAMUSCULAR | Status: DC | PRN

## 2021-07-16 MED ORDER — MIDAZOLAM HCL 2 MG/ML PO SYRP: 8.0000 mg | ORAL_SOLUTION | Freq: Once | ORAL | Status: DC | PRN

## 2021-07-16 SURGICAL SUPPLY — 9 items
BIOPATCH RED 1 DISK 7.0 (GAUZE/BANDAGES/DRESSINGS) ×1 IMPLANT
CATH CANNON HEMO 15FR 19 (HEMODIALYSIS SUPPLIES) ×1 IMPLANT
COVER PROBE U/S 5X48 (MISCELLANEOUS) ×1 IMPLANT
DERMABOND ADVANCED (GAUZE/BANDAGES/DRESSINGS) ×1
DERMABOND ADVANCED .7 DNX12 (GAUZE/BANDAGES/DRESSINGS) IMPLANT
GAUZE SPONGE 4X4 12PLY STRL (GAUZE/BANDAGES/DRESSINGS) ×1 IMPLANT
PACK ANGIOGRAPHY (CUSTOM PROCEDURE TRAY) ×1 IMPLANT
SUT MNCRL AB 4-0 PS2 18 (SUTURE) ×1 IMPLANT
SUT PROLENE 0 CT 1 30 (SUTURE) ×1 IMPLANT

## 2021-07-16 NOTE — Op Note (Signed)
OPERATIVE NOTE ? ? ? ?PRE-OPERATIVE DIAGNOSIS: 1. ESRD ? ? ?POST-OPERATIVE DIAGNOSIS: same as above ? ?PROCEDURE: ?Ultrasound guidance for vascular access to the right internal jugular vein ?Fluoroscopic guidance for placement of catheter ?Placement of a 19 cm tip to cuff tunneled hemodialysis catheter via the right internal jugular vein ? ?SURGEON: Leotis Pain, MD ? ?ANESTHESIA:  Local with Moderate conscious sedation for approximately 18 minutes using 1 mg of Versed and 50 mcg of Fentanyl ? ?ESTIMATED BLOOD LOSS: 10 cc ? ?FLUORO TIME: less than one minute ? ?CONTRAST: none ? ?FINDING(S): ?1.  Patent right internal jugular vein ? ?SPECIMEN(S):  None ? ?INDICATIONS:   ?Luis Miller. is a 76 y.o.male who presents with renal failure.  The patient needs long term dialysis access for their ESRD, and a Permcath is necessary.  Risks and benefits are discussed and informed consent is obtained.   ? ?DESCRIPTION: ?After obtaining full informed written consent, the patient was brought back to the vascular suited. The patient's right neck and chest were sterilely prepped and draped in a sterile surgical field was created. Moderate conscious sedation was administered during a face to face encounter with the patient throughout the procedure with my supervision of the RN administering medicines and monitoring the patient's vital signs, pulse oximetry, telemetry and mental status throughout from the start of the procedure until the patient was taken to the recovery room.  The right internal jugular vein was visualized with ultrasound and found to be patent. It was then accessed under direct ultrasound guidance and a permanent image was recorded. A wire was placed. After skin nick and dilatation, the peel-away sheath was placed over the wire. ?I then turned my attention to an area under the clavicle. Approximately 1-2 fingerbreadths below the clavicle a small counterincision was created and tunneled from the subclavicular  incision to the access site. Using fluoroscopic guidance, a 19 centimeter tip to cuff tunneled hemodialysis catheter was selected, and tunneled from the subclavicular incision to the access site. It was then placed through the peel-away sheath and the peel-away sheath was removed. Using fluoroscopic guidance the catheter tips were parked in the right atrium. The appropriate distal connectors were placed. It withdrew blood well and flushed easily with heparinized saline and a concentrated heparin solution was then placed. It was secured to the chest wall with 2 Prolene sutures. The access incision was closed single 4-0 Monocryl. A 4-0 Monocryl pursestring suture was placed around the exit site. Sterile dressings were placed. ?The patient tolerated the procedure well and was taken to the recovery room in stable condition. ? ?COMPLICATIONS: None ? ?CONDITION: Stable ? ?Leotis Pain, MD ?07/16/2021 ?10:46 AM ? ? ?This note was created with Dragon Medical transcription system. Any errors in dictation are purely unintentional.  ?
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
## 2021-07-20 ENCOUNTER — Ambulatory Visit (INDEPENDENT_AMBULATORY_CARE_PROVIDER_SITE_OTHER): Payer: Medicare Other | Admitting: Nurse Practitioner

## 2021-07-20 ENCOUNTER — Encounter (INDEPENDENT_AMBULATORY_CARE_PROVIDER_SITE_OTHER): Payer: Medicare Other

## 2021-07-24 ENCOUNTER — Other Ambulatory Visit (INDEPENDENT_AMBULATORY_CARE_PROVIDER_SITE_OTHER): Payer: Self-pay | Admitting: Nurse Practitioner

## 2021-07-24 DIAGNOSIS — N186 End stage renal disease: Secondary | ICD-10-CM

## 2021-07-27 ENCOUNTER — Encounter (INDEPENDENT_AMBULATORY_CARE_PROVIDER_SITE_OTHER): Payer: Self-pay | Admitting: Nurse Practitioner

## 2021-07-27 ENCOUNTER — Ambulatory Visit (INDEPENDENT_AMBULATORY_CARE_PROVIDER_SITE_OTHER): Payer: Medicare HMO | Admitting: Nurse Practitioner

## 2021-07-27 ENCOUNTER — Ambulatory Visit (INDEPENDENT_AMBULATORY_CARE_PROVIDER_SITE_OTHER): Payer: Medicare HMO

## 2021-07-27 VITALS — BP 133/62 | HR 64 | Resp 17 | Ht 66.0 in | Wt 192.0 lb

## 2021-07-27 DIAGNOSIS — E1129 Type 2 diabetes mellitus with other diabetic kidney complication: Secondary | ICD-10-CM | POA: Diagnosis not present

## 2021-07-27 DIAGNOSIS — E782 Mixed hyperlipidemia: Secondary | ICD-10-CM | POA: Diagnosis not present

## 2021-07-27 DIAGNOSIS — N186 End stage renal disease: Secondary | ICD-10-CM

## 2021-07-29 ENCOUNTER — Encounter: Payer: Self-pay | Admitting: Nurse Practitioner

## 2021-07-29 ENCOUNTER — Telehealth (INDEPENDENT_AMBULATORY_CARE_PROVIDER_SITE_OTHER): Payer: Self-pay

## 2021-07-29 ENCOUNTER — Ambulatory Visit (INDEPENDENT_AMBULATORY_CARE_PROVIDER_SITE_OTHER): Payer: Medicare HMO | Admitting: Nurse Practitioner

## 2021-07-29 VITALS — BP 141/76 | HR 65 | Temp 97.8°F | Wt 193.2 lb

## 2021-07-29 DIAGNOSIS — R0981 Nasal congestion: Secondary | ICD-10-CM

## 2021-07-29 MED ORDER — FLUTICASONE PROPIONATE 50 MCG/ACT NA SUSP: 2.0000 | Freq: Every day | NASAL | 6 refills | Status: DC

## 2021-07-29 MED ORDER — LORATADINE 10 MG PO TABS: 10.0000 mg | ORAL_TABLET | Freq: Every day | ORAL | 1 refills | Status: DC

## 2021-07-29 MED ORDER — METHYLPREDNISOLONE 4 MG PO TBPK: ORAL_TABLET | ORAL | 0 refills | Status: DC

## 2021-07-29 NOTE — Telephone Encounter (Signed)
Spoke with the patient and he is scheduled with Dr. Delana Meyer for a left brachial cephalic AVF and PD cath removal on 08/14/21 at the [facility]. Pre-surgical instructions were discussed and will be mailed. ?

## 2021-07-29 NOTE — Patient Instructions (Signed)
Neti Pot ?

## 2021-07-29 NOTE — Progress Notes (Signed)
? ?BP (!) 141/76   Pulse 65   Temp 97.8 ?F (36.6 ?C) (Oral)   Wt 193 lb 3.2 oz (87.6 kg)   SpO2 96%   BMI 31.18 kg/m?   ? ?Subjective:  ? ? Patient ID: Luis Coss., male    DOB: July 06, 1945, 76 y.o.   MRN: [id number] ? ?HPI: ?Luis Milledge. is a 76 y.o. male ? ?Chief Complaint  ?Patient presents with  ? Sinus Problem  ?  States allegra is not working, does not want to continue with allegra. Unable to sleep well d/t congestion.  ? ?Eye exam requested from [place]  ? ?Patient states his allergy symptoms have returned.  He is having itchy eyes, congestion, and difficulty sleeping.  He doesn't feel like the allegra is working.   ? ?Patient states he starts hemodialysis tomorrow.  PD did not work out for him. ? ? ?Relevant past medical, surgical, family and social history reviewed and updated as indicated. Interim medical history since our last visit reviewed. ?Allergies and medications reviewed and updated. ? ?Review of Systems  ?HENT:  Positive for congestion.   ?Eyes:  Positive for itching.  ?Psychiatric/Behavioral:  Positive for sleep disturbance.   ? ?Per HPI unless specifically indicated above ? ?   ?Objective:  ?  ?BP (!) 141/76   Pulse 65   Temp 97.8 ?F (36.6 ?C) (Oral)   Wt 193 lb 3.2 oz (87.6 kg)   SpO2 96%   BMI 31.18 kg/m?   ?Wt Readings from Last 3 Encounters:  ?07/29/21 193 lb 3.2 oz (87.6 kg)  ?07/27/21 192 lb (87.1 kg)  ?07/16/21 195 lb (88.5 kg)  ?  ?Physical Exam ?Vitals and nursing note reviewed.  ?Constitutional:   ?   General: He is not in acute distress. ?   Appearance: Normal appearance. He is not ill-appearing, toxic-appearing or diaphoretic.  ?HENT:  ?   Head: Normocephalic.  ?   Right Ear: External ear normal.  ?   Left Ear: External ear normal.  ?   Nose: Congestion and rhinorrhea present.  ?   Mouth/Throat:  ?   Mouth: Mucous membranes are moist.  ?   Pharynx: Posterior oropharyngeal erythema present. No oropharyngeal exudate.  ?Eyes:  ?   General:     ?   Right eye: No  discharge.     ?   Left eye: No discharge.  ?   Extraocular Movements: Extraocular movements intact.  ?   Conjunctiva/sclera: Conjunctivae normal.  ?   Pupils: Pupils are equal, round, and reactive to light.  ?Cardiovascular:  ?   Rate and Rhythm: Normal rate and regular rhythm.  ?   Heart sounds: No murmur heard. ?Pulmonary:  ?   Effort: Pulmonary effort is normal. No respiratory distress.  ?   Breath sounds: Normal breath sounds. No wheezing, rhonchi or rales.  ?Abdominal:  ?   General: Abdomen is flat. Bowel sounds are normal.  ?Musculoskeletal:  ?   Cervical back: Normal range of motion and neck supple.  ?Skin: ?   General: Skin is warm and dry.  ?   Capillary Refill: Capillary refill takes less than 2 seconds.  ?Neurological:  ?   General: No focal deficit present.  ?   Mental Status: He is alert and oriented to person, place, and time.  ?Psychiatric:     ?   Mood and Affect: Mood normal.     ?   Behavior: Behavior normal.     ?  Thought Content: Thought content normal.     ?   Judgment: Judgment normal.  ? ? ?Results for orders placed or performed during the hospital encounter of 07/16/21  ?Potassium [facility] vascular lab only)  ?Result Value Ref Range  ? Potassium [facility] vascular lab) 3.6 3.5 - 5.1 mmol/L  ? ?   ?Assessment & Plan:  ? ?Problem List Items Addressed This Visit   ?None ?Visit Diagnoses   ? ? Nasal congestion    -  Primary  ? Will treat with prednisone dose pak. Will change to Claritin and Flonase given.  FU if symptoms do not improve.  Recommend using Neti Pot.  ? ?  ?  ? ?Follow up plan: ?Return if symptoms worsen or fail to improve. ? ? ? ? ? ?

## 2021-07-30 ENCOUNTER — Ambulatory Visit: Payer: Self-pay

## 2021-07-30 DIAGNOSIS — Z1159 Encounter for screening for other viral diseases: Secondary | ICD-10-CM | POA: Diagnosis not present

## 2021-07-30 DIAGNOSIS — N186 End stage renal disease: Secondary | ICD-10-CM | POA: Diagnosis not present

## 2021-07-30 DIAGNOSIS — Z992 Dependence on renal dialysis: Secondary | ICD-10-CM | POA: Diagnosis not present

## 2021-07-30 NOTE — Telephone Encounter (Signed)
?  Chief Complaint: advice ?Symptoms: NA ?Frequency: NA ?Pertinent Negatives: NA ?Disposition: [] ED /[] Urgent Care (no appt availability in office) / [] Appointment(In office/virtual)/ []  [place] [facility]/ [x] Home Care/ [] Refused Recommended Disposition /[] [place] [facility]/ []  Follow-up with PCP ?Additional Notes: I advised pt how to take prednisone taper on days of going to dialysis. Pt verbalized understanding and was instructed to call back if he had any more questions.  ? ?Summary: medication and Dialysis  ? Pt was prescribed methylPREDNISolone (MEDROL DOSEPAK) 4 MG TBPK tablet but has dialysis every other day / he asked if its okay for him to skip every other day taking this because he wont be able to when having dialysis / please advise   ?  ? ?Reason for Disposition ? Caller has medicine question only, adult not sick, AND triager answers question ? ?Answer Assessment - Initial Assessment Questions ?1. NAME of MEDICATION: "What medicine are you calling about?" ?    prednisone ?2. QUESTION: "What is your question?" (e.g., double dose of medicine, side effect) ?    Needing to know how to take with going to Dialysis ?3. PRESCRIBING HCP: "Who prescribed it?" Reason: if prescribed by specialist, call should be referred to that group. ?    Santiago Glad, NP ? ?Protocols used: Medication Question Call-A-AH ? ?

## 2021-08-01 DIAGNOSIS — Z992 Dependence on renal dialysis: Secondary | ICD-10-CM | POA: Diagnosis not present

## 2021-08-01 DIAGNOSIS — N186 End stage renal disease: Secondary | ICD-10-CM | POA: Diagnosis not present

## 2021-08-01 DIAGNOSIS — G4733 Obstructive sleep apnea (adult) (pediatric): Secondary | ICD-10-CM | POA: Diagnosis not present

## 2021-08-04 DIAGNOSIS — Z992 Dependence on renal dialysis: Secondary | ICD-10-CM | POA: Diagnosis not present

## 2021-08-04 DIAGNOSIS — N186 End stage renal disease: Secondary | ICD-10-CM | POA: Diagnosis not present

## 2021-08-06 DIAGNOSIS — Z992 Dependence on renal dialysis: Secondary | ICD-10-CM | POA: Diagnosis not present

## 2021-08-06 DIAGNOSIS — N186 End stage renal disease: Secondary | ICD-10-CM | POA: Diagnosis not present

## 2021-08-07 ENCOUNTER — Other Ambulatory Visit (INDEPENDENT_AMBULATORY_CARE_PROVIDER_SITE_OTHER): Payer: Self-pay | Admitting: Nurse Practitioner

## 2021-08-07 ENCOUNTER — Other Ambulatory Visit: Payer: Self-pay

## 2021-08-07 ENCOUNTER — Other Ambulatory Visit
Admission: RE | Admit: 2021-08-07 | Discharge: 2021-08-07 | Disposition: A | Discharge status: Home / Self Care | Payer: Medicare HMO | Source: Ambulatory Visit | Attending: Vascular Surgery | Admitting: Vascular Surgery

## 2021-08-07 DIAGNOSIS — N186 End stage renal disease: Secondary | ICD-10-CM

## 2021-08-07 HISTORY — DX: Prediabetes: R73.03

## 2021-08-07 HISTORY — DX: Cerebral infarction, unspecified: I63.9

## 2021-08-07 NOTE — Patient Instructions (Addendum)
Your procedure is scheduled on: 08/14/21 - Friday Report to the Registration Desk on the 1st floor of the [facility]. To find out your arrival time, please call [phone] between 1PM - 3PM on: 08/13/21 - Thursday If your arrival time is 6:00 am, do not arrive prior to that time as the [facility] entrance doors do not open until 6:00 am.  REMEMBER: Instructions that are not followed completely may result in serious medical risk, up to and including death; or upon the discretion of your surgeon and anesthesiologist your surgery may need to be rescheduled.  Do not eat food after midnight the night before surgery.  No gum chewing, lozengers or hard candies.  You may however, drink CLEAR liquids up to 2 hours before you are scheduled to arrive for your surgery. Do not drink anything within 2 hours of your scheduled arrival time.  Clear liquids include: - water  - apple juice without pulp - gatorade (not RED colors) - black coffee or tea (Do NOT add milk or creamers to the coffee or tea) Do NOT drink anything that is not on this list.  TAKE THESE MEDICATIONS THE MORNING OF SURGERY WITH A SIP OF WATER:  - allopurinol (ZYLOPRIM) 100 MG tablet - amLODipine (NORVASC) 10 MG tablet - fluticasone (FLONASE) 50 MCG/ACT nasal spray - hydrALAZINE (APRESOLINE) 100 MG tablet  One week prior to surgery: Stop Anti-inflammatories (NSAIDS) such as Advil, Aleve, Ibuprofen, Motrin, Naproxen, Naprosyn and Aspirin based products such as Excedrin, Goodys Powder, BC Powder.  Stop ANY OVER THE COUNTER supplements until after surgery.Multiple Vitamin (MULTIVITAMIN) tablet, Vitamin D,   You may take Tylenol if needed for pain up until the day of surgery.  No Alcohol for 24 hours before or after surgery.  No Smoking including e-cigarettes for 24 hours prior to surgery.  No chewable tobacco products for at least 6 hours prior to surgery.  No nicotine patches on the day of surgery.  Do not use any  "recreational" drugs for at least a week prior to your surgery.  Please be advised that the combination of cocaine and anesthesia may have negative outcomes, up to and including death. If you test positive for cocaine, your surgery will be cancelled.  On the morning of surgery brush your teeth with toothpaste and water, you may rinse your mouth with mouthwash if you wish. Do not swallow any toothpaste or mouthwash.  Use CHG Soap or wipes as directed on instruction sheet.  Do not wear jewelry, make-up, hairpins, clips or nail polish.  Do not wear lotions, powders, or perfumes.   Do not shave body from the neck down 48 hours prior to surgery just in case you cut yourself which could leave a site for infection.  Also, freshly shaved skin may become irritated if using the CHG soap.  Contact lenses, hearing aids and dentures may not be worn into surgery.  Do not bring valuables to the hospital. [facility] is not responsible for any missing/lost belongings or valuables.   Notify your doctor if there is any change in your medical condition (cold, fever, infection).  Wear comfortable clothing (specific to your surgery type) to the hospital.  After surgery, you can help prevent lung complications by doing breathing exercises.  Take deep breaths and cough every 1-2 hours. Your doctor may order a device called an Incentive Spirometer to help you take deep breaths. When coughing or sneezing, hold a pillow firmly against your incision with both hands. This is called "  splinting." Doing this helps protect your incision. It also decreases belly discomfort.  If you are being admitted to the hospital overnight, leave your suitcase in the car. After surgery it may be brought to your room.  If you are being discharged the day of surgery, you will not be allowed to drive home. You will need a responsible adult (18 years or older) to drive you home and stay with you that night.   If you are taking  public transportation, you will need to have a responsible adult (18 years or older) with you. Please confirm with your physician that it is acceptable to use public transportation.   Please call the [facility] at [phone] if you have any questions about these instructions.  Surgery Visitation Policy:  Patients undergoing a surgery or procedure may have two family members or support persons with them as long as the person is not COVID-19 positive or experiencing its symptoms.   Inpatient Visitation:    Visiting hours are 7 a.m. to 8 p.m. Up to four visitors are allowed at one time in a patient room, including children. The visitors may rotate out with other people during the day. One designated support person (adult) may remain overnight.

## 2021-08-08 ENCOUNTER — Encounter (INDEPENDENT_AMBULATORY_CARE_PROVIDER_SITE_OTHER): Payer: Self-pay | Admitting: Nurse Practitioner

## 2021-08-08 DIAGNOSIS — Z992 Dependence on renal dialysis: Secondary | ICD-10-CM | POA: Diagnosis not present

## 2021-08-08 DIAGNOSIS — N186 End stage renal disease: Secondary | ICD-10-CM | POA: Diagnosis not present
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
## 2021-08-10 ENCOUNTER — Encounter: Payer: Self-pay | Admitting: Urgent Care

## 2021-08-10 ENCOUNTER — Other Ambulatory Visit
Admission: RE | Admit: 2021-08-10 | Discharge: 2021-08-10 | Disposition: A | Discharge status: Home / Self Care | Payer: Medicare HMO | Source: Ambulatory Visit | Attending: Vascular Surgery | Admitting: Vascular Surgery

## 2021-08-10 DIAGNOSIS — Z01818 Encounter for other preprocedural examination: Secondary | ICD-10-CM | POA: Insufficient documentation

## 2021-08-10 DIAGNOSIS — N186 End stage renal disease: Secondary | ICD-10-CM | POA: Diagnosis not present

## 2021-08-10 DIAGNOSIS — Z01810 Encounter for preprocedural cardiovascular examination: Secondary | ICD-10-CM | POA: Diagnosis not present

## 2021-08-10 LAB — CBC WITH DIFFERENTIAL/PLATELET
Abs Immature Granulocytes: 0.01 10*3/uL (ref 0.00–0.07)
Basophils Absolute: 0 10*3/uL (ref 0.0–0.1)
Basophils Relative: 0 %
Eosinophils Absolute: 0.2 10*3/uL (ref 0.0–0.5)
Eosinophils Relative: 5 %
HCT: 28.9 % — ABNORMAL LOW (ref 39.0–52.0)
Hemoglobin: 9.9 g/dL — ABNORMAL LOW (ref 13.0–17.0)
Immature Granulocytes: 0 %
Lymphocytes Relative: 18 %
Lymphs Abs: 0.6 10*3/uL — ABNORMAL LOW (ref 0.7–4.0)
MCH: 34.1 pg — ABNORMAL HIGH (ref 26.0–34.0)
MCHC: 34.3 g/dL (ref 30.0–36.0)
MCV: 99.7 fL (ref 80.0–100.0)
Monocytes Absolute: 0.5 10*3/uL (ref 0.1–1.0)
Monocytes Relative: 14 %
Neutro Abs: 2.3 10*3/uL (ref 1.7–7.7)
Neutrophils Relative %: 63 %
Platelets: 112 10*3/uL — ABNORMAL LOW (ref 150–400)
RBC: 2.9 MIL/uL — ABNORMAL LOW (ref 4.22–5.81)
RDW: 13.2 % (ref 11.5–15.5)
WBC: 3.6 10*3/uL — ABNORMAL LOW (ref 4.0–10.5)
nRBC: 0 % (ref 0.0–0.2)

## 2021-08-10 LAB — BASIC METABOLIC PANEL
Anion gap: 10 (ref 5–15)
BUN: 38 mg/dL — ABNORMAL HIGH (ref 8–23)
CO2: 30 mmol/L (ref 22–32)
Calcium: 9.3 mg/dL (ref 8.9–10.3)
Chloride: 104 mmol/L (ref 98–111)
Creatinine, Ser: 3.98 mg/dL — ABNORMAL HIGH (ref 0.61–1.24)
GFR, Estimated: 15 mL/min — ABNORMAL LOW (ref >60)
Glucose, Bld: 103 mg/dL — ABNORMAL HIGH (ref 70–99)
Potassium: 3.3 mmol/L — ABNORMAL LOW (ref 3.5–5.1)
Sodium: 144 mmol/L (ref 135–145)

## 2021-08-10 LAB — TYPE AND SCREEN
ABO/RH(D): A POS
Antibody Screen: NEGATIVE

## 2021-08-11 DIAGNOSIS — Z992 Dependence on renal dialysis: Secondary | ICD-10-CM | POA: Diagnosis not present

## 2021-08-11 DIAGNOSIS — N186 End stage renal disease: Secondary | ICD-10-CM | POA: Diagnosis not present

## 2021-08-13 DIAGNOSIS — N186 End stage renal disease: Secondary | ICD-10-CM | POA: Diagnosis not present

## 2021-08-13 DIAGNOSIS — Z992 Dependence on renal dialysis: Secondary | ICD-10-CM | POA: Diagnosis not present

## 2021-08-13 MED ORDER — LACTATED RINGERS IV SOLN: INTRAVENOUS | Status: DC

## 2021-08-13 MED ORDER — CEFAZOLIN SODIUM-DEXTROSE 2-4 GM/100ML-% IV SOLN
2.0000 g | INTRAVENOUS | Status: AC
  Administered 2021-08-14: 2 g via INTRAVENOUS

## 2021-08-13 MED ORDER — CHLORHEXIDINE GLUCONATE 0.12 % MT SOLN: 15.0000 mL | Freq: Once | OROMUCOSAL | Status: AC

## 2021-08-13 MED ORDER — CHLORHEXIDINE GLUCONATE CLOTH 2 % EX PADS
6.0000 | MEDICATED_PAD | Freq: Once | CUTANEOUS | Status: AC
  Administered 2021-08-14: 6 via TOPICAL

## 2021-08-13 MED ORDER — ORAL CARE MOUTH RINSE: 15.0000 mL | Freq: Once | OROMUCOSAL | Status: AC

## 2021-08-13 MED ORDER — MIDAZOLAM HCL 2 MG/ML PO SYRP: 8.0000 mg | ORAL_SOLUTION | Freq: Once | ORAL | Status: DC | PRN

## 2021-08-13 MED ORDER — FAMOTIDINE 20 MG PO TABS: 20.0000 mg | ORAL_TABLET | Freq: Once | ORAL | Status: AC

## 2021-08-13 MED ORDER — CHLORHEXIDINE GLUCONATE CLOTH 2 % EX PADS
6.0000 | MEDICATED_PAD | Freq: Once | CUTANEOUS | Status: AC
Start: 2021-08-13 — End: 2021-08-14
  Administered 2021-08-14: 6 via TOPICAL

## 2021-08-14 ENCOUNTER — Telehealth: Payer: Self-pay | Admitting: Nurse Practitioner

## 2021-08-14 ENCOUNTER — Other Ambulatory Visit: Payer: Self-pay

## 2021-08-14 ENCOUNTER — Ambulatory Visit: Payer: Medicare HMO | Admitting: Anesthesiology

## 2021-08-14 ENCOUNTER — Ambulatory Visit
Admission: RE | Admit: 2021-08-14 | Discharge: 2021-08-14 | Disposition: A | Discharge status: Home / Self Care | Payer: Medicare HMO | Attending: Vascular Surgery | Admitting: Vascular Surgery

## 2021-08-14 ENCOUNTER — Encounter: Payer: Self-pay | Admitting: Vascular Surgery

## 2021-08-14 ENCOUNTER — Encounter
Admission: RE | Disposition: A | Discharge status: Home / Self Care | Payer: Self-pay | Source: Home / Self Care | Attending: Vascular Surgery

## 2021-08-14 DIAGNOSIS — Z4902 Encounter for fitting and adjustment of peritoneal dialysis catheter: Secondary | ICD-10-CM

## 2021-08-14 DIAGNOSIS — E1122 Type 2 diabetes mellitus with diabetic chronic kidney disease: Secondary | ICD-10-CM | POA: Insufficient documentation

## 2021-08-14 DIAGNOSIS — D631 Anemia in chronic kidney disease: Secondary | ICD-10-CM | POA: Diagnosis not present

## 2021-08-14 DIAGNOSIS — I120 Hypertensive chronic kidney disease with stage 5 chronic kidney disease or end stage renal disease: Secondary | ICD-10-CM | POA: Diagnosis not present

## 2021-08-14 DIAGNOSIS — T85691A Other mechanical complication of intraperitoneal dialysis catheter, initial encounter: Secondary | ICD-10-CM

## 2021-08-14 DIAGNOSIS — E782 Mixed hyperlipidemia: Secondary | ICD-10-CM | POA: Insufficient documentation

## 2021-08-14 DIAGNOSIS — Z992 Dependence on renal dialysis: Secondary | ICD-10-CM

## 2021-08-14 DIAGNOSIS — Z87891 Personal history of nicotine dependence: Secondary | ICD-10-CM | POA: Diagnosis not present

## 2021-08-14 DIAGNOSIS — N186 End stage renal disease: Secondary | ICD-10-CM | POA: Insufficient documentation

## 2021-08-14 DIAGNOSIS — E785 Hyperlipidemia, unspecified: Secondary | ICD-10-CM | POA: Diagnosis not present

## 2021-08-14 HISTORY — PX: REMOVAL OF A DIALYSIS CATHETER: SHX6053

## 2021-08-14 HISTORY — PX: AV FISTULA PLACEMENT: SHX1204

## 2021-08-14 LAB — POCT I-STAT, CHEM 8
BUN: 25 mg/dL — ABNORMAL HIGH (ref 8–23)
Calcium, Ion: 1.06 mmol/L — ABNORMAL LOW (ref 1.15–1.40)
Chloride: 99 mmol/L (ref 98–111)
Creatinine, Ser: 3.7 mg/dL — ABNORMAL HIGH (ref 0.61–1.24)
Glucose, Bld: 100 mg/dL — ABNORMAL HIGH (ref 70–99)
HCT: 28 % — ABNORMAL LOW (ref 39.0–52.0)
Hemoglobin: 9.5 g/dL — ABNORMAL LOW (ref 13.0–17.0)
Potassium: 3.5 mmol/L (ref 3.5–5.1)
Sodium: 142 mmol/L (ref 135–145)
TCO2: 34 mmol/L — ABNORMAL HIGH (ref 22–32)

## 2021-08-14 SURGERY — INSERTION OF ARTERIOVENOUS (AV) GORE-TEX GRAFT ARM
Anesthesia: General

## 2021-08-14 MED ORDER — BUPIVACAINE LIPOSOME 1.3 % IJ SUSP
INTRAMUSCULAR | Status: DC | PRN
  Administered 2021-08-14: 38 mL

## 2021-08-14 MED ORDER — BUPIVACAINE LIPOSOME 1.3 % IJ SUSP
INTRAMUSCULAR | Status: AC
  Filled 2021-08-14: qty 20

## 2021-08-14 MED ORDER — BACITRACIN ZINC 500 UNIT/GM EX OINT
TOPICAL_OINTMENT | CUTANEOUS | Status: AC
Start: 2021-08-14 — End: ?
  Filled 2021-08-14: qty 28.35

## 2021-08-14 MED ORDER — EPHEDRINE SULFATE (PRESSORS) 50 MG/ML IJ SOLN
INTRAMUSCULAR | Status: DC | PRN
Start: 2021-08-14 — End: 2021-08-14
  Administered 2021-08-14 (×2): 10 mg via INTRAVENOUS

## 2021-08-14 MED ORDER — SUCCINYLCHOLINE CHLORIDE 200 MG/10ML IV SOSY
PREFILLED_SYRINGE | INTRAVENOUS | Status: AC
  Filled 2021-08-14: qty 10

## 2021-08-14 MED ORDER — HEPARIN 5000 UNITS IN NS 1000 ML (FLUSH)
INTRAMUSCULAR | Status: DC | PRN
  Administered 2021-08-14: 200 mL via INTRAMUSCULAR

## 2021-08-14 MED ORDER — FENTANYL CITRATE (PF) 100 MCG/2ML IJ SOLN: 25.0000 ug | INTRAMUSCULAR | Status: DC | PRN

## 2021-08-14 MED ORDER — ACETAMINOPHEN 10 MG/ML IV SOLN
INTRAVENOUS | Status: AC
  Filled 2021-08-14: qty 100

## 2021-08-14 MED ORDER — PHENYLEPHRINE HCL-NACL 20-0.9 MG/250ML-% IV SOLN
INTRAVENOUS | Status: DC | PRN
  Administered 2021-08-14: 20 ug/min via INTRAVENOUS

## 2021-08-14 MED ORDER — PHENYLEPHRINE HCL (PRESSORS) 10 MG/ML IV SOLN
INTRAVENOUS | Status: DC | PRN
  Administered 2021-08-14: 160 ug via INTRAVENOUS

## 2021-08-14 MED ORDER — OXYCODONE HCL 5 MG/5ML PO SOLN: 5.0000 mg | Freq: Once | ORAL | Status: DC | PRN

## 2021-08-14 MED ORDER — FENTANYL CITRATE (PF) 100 MCG/2ML IJ SOLN
INTRAMUSCULAR | Status: DC | PRN
  Administered 2021-08-14 (×2): 50 ug via INTRAVENOUS

## 2021-08-14 MED ORDER — ONDANSETRON HCL 4 MG/2ML IJ SOLN
INTRAMUSCULAR | Status: DC | PRN
  Administered 2021-08-14: 4 mg via INTRAVENOUS

## 2021-08-14 MED ORDER — PROPOFOL 10 MG/ML IV BOLUS
INTRAVENOUS | Status: DC | PRN
  Administered 2021-08-14: 120 mg via INTRAVENOUS
  Administered 2021-08-14: 30 mg via INTRAVENOUS
  Administered 2021-08-14: 50 mg via INTRAVENOUS

## 2021-08-14 MED ORDER — HYDROCODONE-ACETAMINOPHEN 5-325 MG PO TABS: 1.0000 | ORAL_TABLET | Freq: Four times a day (QID) | ORAL | 0 refills | Status: DC | PRN

## 2021-08-14 MED ORDER — CHLORHEXIDINE GLUCONATE 0.12 % MT SOLN
OROMUCOSAL | Status: AC
  Administered 2021-08-14: 15 mL via OROMUCOSAL
  Filled 2021-08-14: qty 15

## 2021-08-14 MED ORDER — CEFAZOLIN SODIUM-DEXTROSE 2-4 GM/100ML-% IV SOLN
INTRAVENOUS | Status: AC
  Filled 2021-08-14: qty 100

## 2021-08-14 MED ORDER — SUCCINYLCHOLINE CHLORIDE 200 MG/10ML IV SOSY
PREFILLED_SYRINGE | INTRAVENOUS | Status: DC | PRN
  Administered 2021-08-14: 100 mg via INTRAVENOUS

## 2021-08-14 MED ORDER — DEXAMETHASONE SODIUM PHOSPHATE 10 MG/ML IJ SOLN
INTRAMUSCULAR | Status: AC
  Filled 2021-08-14: qty 1

## 2021-08-14 MED ORDER — ONDANSETRON HCL 4 MG/2ML IJ SOLN
INTRAMUSCULAR | Status: AC
  Filled 2021-08-14: qty 2

## 2021-08-14 MED ORDER — FENTANYL CITRATE (PF) 100 MCG/2ML IJ SOLN
INTRAMUSCULAR | Status: AC
  Filled 2021-08-14: qty 2

## 2021-08-14 MED ORDER — BUPIVACAINE LIPOSOME 1.3 % IJ SUSP
INTRAMUSCULAR | Status: DC | PRN
  Administered 2021-08-14: 12 mL

## 2021-08-14 MED ORDER — BUPIVACAINE HCL (PF) 0.5 % IJ SOLN
INTRAMUSCULAR | Status: AC
  Filled 2021-08-14: qty 30

## 2021-08-14 MED ORDER — MIDAZOLAM HCL 2 MG/2ML IJ SOLN
INTRAMUSCULAR | Status: AC
  Filled 2021-08-14: qty 2

## 2021-08-14 MED ORDER — DEXAMETHASONE SODIUM PHOSPHATE 10 MG/ML IJ SOLN
INTRAMUSCULAR | Status: DC | PRN
  Administered 2021-08-14: 10 mg via INTRAVENOUS

## 2021-08-14 MED ORDER — ACETAMINOPHEN 10 MG/ML IV SOLN
INTRAVENOUS | Status: DC | PRN
  Administered 2021-08-14: 1000 mg via INTRAVENOUS

## 2021-08-14 MED ORDER — SODIUM CHLORIDE 0.9 % IV SOLN: INTRAVENOUS | Status: DC | PRN

## 2021-08-14 MED ORDER — FAMOTIDINE 20 MG PO TABS
ORAL_TABLET | ORAL | Status: AC
  Administered 2021-08-14: 20 mg via ORAL
  Filled 2021-08-14: qty 1

## 2021-08-14 MED ORDER — OXYCODONE HCL 5 MG PO TABS: 5.0000 mg | ORAL_TABLET | Freq: Once | ORAL | Status: DC | PRN

## 2021-08-14 MED ORDER — LIDOCAINE HCL (CARDIAC) PF 100 MG/5ML IV SOSY
PREFILLED_SYRINGE | INTRAVENOUS | Status: DC | PRN
  Administered 2021-08-14: 60 mg via INTRAVENOUS

## 2021-08-14 MED ORDER — HEPARIN SODIUM (PORCINE) 5000 UNIT/ML IJ SOLN
INTRAMUSCULAR | Status: AC
  Filled 2021-08-14: qty 1

## 2021-08-14 MED ORDER — EPHEDRINE 5 MG/ML INJ
INTRAVENOUS | Status: AC
  Filled 2021-08-14: qty 5

## 2021-08-14 MED ORDER — PHENYLEPHRINE HCL-NACL 20-0.9 MG/250ML-% IV SOLN
INTRAVENOUS | Status: AC
  Filled 2021-08-14: qty 250

## 2021-08-14 MED ORDER — ACETAMINOPHEN 10 MG/ML IV SOLN: 1000.0000 mg | Freq: Once | INTRAVENOUS | Status: DC | PRN

## 2021-08-14 MED ORDER — PROPOFOL 10 MG/ML IV BOLUS
INTRAVENOUS | Status: AC
  Filled 2021-08-14: qty 20

## 2021-08-14 MED ORDER — HYDROMORPHONE HCL 1 MG/ML IJ SOLN: 1.0000 mg | Freq: Once | INTRAMUSCULAR | Status: DC | PRN

## 2021-08-14 MED ORDER — ONDANSETRON HCL 4 MG/2ML IJ SOLN: 4.0000 mg | Freq: Once | INTRAMUSCULAR | Status: DC | PRN

## 2021-08-14 MED ORDER — BACITRACIN 500 UNIT/GM EX OINT
TOPICAL_OINTMENT | CUTANEOUS | Status: DC | PRN
Start: 2021-08-14 — End: 2021-08-14
  Administered 2021-08-14: 1 via TOPICAL

## 2021-08-14 MED ORDER — LIDOCAINE HCL (PF) 2 % IJ SOLN
INTRAMUSCULAR | Status: AC
  Filled 2021-08-14: qty 5

## 2021-08-14 SURGICAL SUPPLY — 72 items
ADH SKN CLS APL DERMABOND .7 (GAUZE/BANDAGES/DRESSINGS) ×2
APL PRP STRL LF DISP 70% ISPRP (MISCELLANEOUS) ×4
BAG DECANTER FOR FLEXI CONT (MISCELLANEOUS) ×3 IMPLANT
BLADE SURG 15 STRL LF DISP TIS (BLADE) ×2 IMPLANT
BLADE SURG 15 STRL SS (BLADE) ×3
BLADE SURG SZ11 CARB STEEL (BLADE) ×3 IMPLANT
BNDG ADH 2 X3.75 FABRIC TAN LF (GAUZE/BANDAGES/DRESSINGS) ×1 IMPLANT
BNDG ADH XL 3.75X2 STRCH LF (GAUZE/BANDAGES/DRESSINGS) ×2
BOOT SUTURE AID YELLOW STND (SUTURE) ×3 IMPLANT
BRUSH SCRUB EZ  4% CHG (MISCELLANEOUS) ×2
BRUSH SCRUB EZ 4% CHG (MISCELLANEOUS) ×2 IMPLANT
CHLORAPREP W/TINT 26 (MISCELLANEOUS) ×4 IMPLANT
COVER LIGHT HANDLE STERIS (MISCELLANEOUS) ×2 IMPLANT
DERMABOND ADVANCED (GAUZE/BANDAGES/DRESSINGS) ×1
DERMABOND ADVANCED .7 DNX12 (GAUZE/BANDAGES/DRESSINGS) ×2 IMPLANT
DRAPE EXTREMITY 106X87X128.5 (DRAPES) ×1 IMPLANT
DRAPE IMP U-DRAPE 54X76 (DRAPES) ×1 IMPLANT
DRAPE LAPAROTOMY 100X77 ABD (DRAPES) ×3 IMPLANT
DRAPE LAPAROTOMY 77X122 PED (DRAPES) ×1 IMPLANT
ELECT CAUTERY BLADE 6.4 (BLADE) ×3 IMPLANT
ELECT REM PT RETURN 9FT ADLT (ELECTROSURGICAL) ×3
ELECTRODE REM PT RTRN 9FT ADLT (ELECTROSURGICAL) ×2 IMPLANT
GAUZE 4X4 16PLY ~~LOC~~+RFID DBL (SPONGE) ×4 IMPLANT
GLOVE SURG SYN 8.0 (GLOVE) ×6 IMPLANT
GLOVE SURG SYN 8.0 PF PI (GLOVE) ×2 IMPLANT
GOWN STRL REUS W/ TWL LRG LVL3 (GOWN DISPOSABLE) ×2 IMPLANT
GOWN STRL REUS W/ TWL XL LVL3 (GOWN DISPOSABLE) ×2 IMPLANT
GOWN STRL REUS W/TWL LRG LVL3 (GOWN DISPOSABLE) ×3
GOWN STRL REUS W/TWL XL LVL3 (GOWN DISPOSABLE) ×3
GRAFT PROPATEN STD WALL 4 7X45 (Vascular Products) ×1 IMPLANT
IV NS 500ML (IV SOLUTION) ×3
IV NS 500ML BAXH (IV SOLUTION) ×2 IMPLANT
KIT TURNOVER KIT A (KITS) ×3 IMPLANT
LABEL OR SOLS (LABEL) ×3 IMPLANT
LOOP RED MAXI  1X406MM (MISCELLANEOUS) ×1
LOOP VESSEL MAXI 1X406 RED (MISCELLANEOUS) ×2 IMPLANT
LOOP VESSEL MINI 0.8X406 BLUE (MISCELLANEOUS) ×4 IMPLANT
LOOPS BLUE MINI 0.8X406MM (MISCELLANEOUS) ×2
MANIFOLD NEPTUNE II (INSTRUMENTS) ×3 IMPLANT
NDL FILTER BLUNT 18X1 1/2 (NEEDLE) ×2 IMPLANT
NDL HYPO 25X1 1.5 SAFETY (NEEDLE) ×2 IMPLANT
NEEDLE FILTER BLUNT 18X 1/2SAF (NEEDLE) ×1
NEEDLE FILTER BLUNT 18X1 1/2 (NEEDLE) ×2 IMPLANT
NEEDLE HYPO 25X1 1.5 SAFETY (NEEDLE) ×3 IMPLANT
PACK BASIN MINOR ARMC (MISCELLANEOUS) ×3 IMPLANT
PACK EXTREMITY ARMC (MISCELLANEOUS) ×3 IMPLANT
PAD PREP 24X41 OB/GYN DISP (PERSONAL CARE ITEMS) ×3 IMPLANT
PENCIL ELECTRO HAND CTR (MISCELLANEOUS) ×1 IMPLANT
SPONGE GAUZE 2X2 8PLY STRL LF (GAUZE/BANDAGES/DRESSINGS) ×3 IMPLANT
STOCKINETTE 48X4 2 PLY STRL (GAUZE/BANDAGES/DRESSINGS) ×2 IMPLANT
STOCKINETTE STRL 4IN 9604848 (GAUZE/BANDAGES/DRESSINGS) ×3 IMPLANT
SUT MNCRL 4-0 (SUTURE) ×9
SUT MNCRL 4-0 27XMFL (SUTURE) ×6
SUT MNCRL+ 5-0 UNDYED PC-3 (SUTURE) ×2 IMPLANT
SUT MONOCRYL 5-0 (SUTURE) ×1
SUT PROLENE 6 0 BV (SUTURE) ×11 IMPLANT
SUT SILK 2 0 (SUTURE) ×3
SUT SILK 2 0 SH (SUTURE) ×3 IMPLANT
SUT SILK 2-0 18XBRD TIE 12 (SUTURE) ×2 IMPLANT
SUT SILK 3 0 (SUTURE) ×3
SUT SILK 3-0 18XBRD TIE 12 (SUTURE) ×2 IMPLANT
SUT SILK 4 0 (SUTURE) ×3
SUT SILK 4-0 18XBRD TIE 12 (SUTURE) ×2 IMPLANT
SUT VIC AB 0 CT2 27 (SUTURE) ×5 IMPLANT
SUT VIC AB 3-0 SH 27 (SUTURE) ×12
SUT VIC AB 3-0 SH 27X BRD (SUTURE) ×4 IMPLANT
SUTURE MNCRL 4-0 27XMF (SUTURE) IMPLANT
SYR 10ML LL (SYRINGE) ×3 IMPLANT
SYR 20ML LL LF (SYRINGE) ×3 IMPLANT
SYR 3ML LL SCALE MARK (SYRINGE) ×3 IMPLANT
TOWEL OR 17X26 4PK STRL BLUE (TOWEL DISPOSABLE) ×1 IMPLANT
TUBING CONNECTING 10 (TUBING) ×1 IMPLANT

## 2021-08-14 NOTE — Telephone Encounter (Signed)
OK to call and cancel RX at the pharmacy?

## 2021-08-14 NOTE — OR Nursing (Signed)
DISCHARGE PENDING RETURN CALL FROM FAMILY FOR D/C INSTRUCTIONS

## 2021-08-14 NOTE — Op Note (Signed)
OPERATIVE NOTE   PROCEDURE:  left brachial cephalic arteriovenous fistula placement. Removal of nonfunctioning peritoneal dialysis catheter  PRE-OPERATIVE DIAGNOSIS: End Stage Renal Disease  POST-OPERATIVE DIAGNOSIS: End Stage Renal Disease  SURGEON: Hortencia Pilar  ASSISTANT(S): None  ANESTHESIA: general  ESTIMATED BLOOD LOSS: 25 cc  FINDING(S): 3 mm cephalic vein  SPECIMEN(S):  none  INDICATIONS:   Luis Miller. is a 76 y.o. male who presents with end stage renal disease.  The patient is scheduled for left brachiocephalic arteriovenous fistula placement.  The patient is aware the risks include but are not limited to: bleeding, infection, steal syndrome, nerve damage, ischemic monomelic neuropathy, failure to mature, and need for additional procedures.  The patient is aware of the risks of the procedure and elects to proceed forward.  DESCRIPTION: After full informed written consent was obtained from the patient, the patient was brought back to the operating room and placed supine upon the operating table.  Prior to induction, the patient received IV antibiotics.   After obtaining adequate anesthesia, the patient was then prepped and draped in the standard fashion for a left arm access procedure.   A first assistant was required to provide a safe and appropriate environment for executing the surgery.  The assistant was integral in providing retraction, exposure, running suture providing suction and in the closing process.   A curvilinear incision was then created midway between the radial impulse and the cephalic vein. The cephalic vein was then identified and dissected circumferentially. It was marked with a surgical marker.    Attention was then turned to the brachial artery which was exposed through the same incision and looped proximally and distally. Side branches were controlled with 4-0 silk ties.  The distal segment of the vein was ligated with a  2-0 silk,  and the vein was transected.  The proximal segment was interrogated with serial dilators.  The vein accepted up to a 3 mm dilator without any difficulty. Heparinized saline was infused into the vein and clamped it with a small bulldog.  At this point, I reset my exposure of the brachial artery and controlled the artery with vessel loops proximally and distally.  An arteriotomy was then made with a #11 blade, and extended with a Potts scissor.  Heparinized saline was injected proximal and distal into the radial artery.  The vein was then approximated to the artery while the artery was in its native bed and subsequently the vein was beveled using Potts scissors. The vein was then sewn to the artery in an end-to-side configuration with a running stitch of 6-0 Prolene.  Prior to completing this anastomosis Flushing maneuvers were performed and the artery was allowed to forward and back bleed.  There was no evidence of clot from any vessels.  I completed the anastomosis in the usual fashion and then released all vessel loops and clamps.    There was good  thrill in the venous outflow, and there was 1+ palpable radial pulse.  At this point, I irrigated out the surgical wound.  There was no further active bleeding.  The subcutaneous tissue was reapproximated with a running stitch of 3-0 Vicryl.  The skin was then reapproximated with a running subcuticular stitch of 4-0 Vicryl.  The skin was then cleaned, dried, and reinforced with Dermabond.    Attention was then turned to the PD catheter.  The patient's abdomen was then prepped and draped in a sterile fashion.  The cuff was localized.  After appropriate timeout is called, local anesthetic with epinephrine is infiltrated into the surrounding tissues around the cuff in the left lower quadrant. Previous incision is open with an 15 blade scalpel and the dissection was carried down to expose the cuff of the tunneled catheter.  The catheter is then freed from the  surrounding attachments and adhesions. Once the catheter has been freed circumferentially a pursestring suture of 0 Vicryl is placed within the anterior rectus sheath. The catheter is then removed from the peritoneal cavity and the pursestring suture secured. The second cuff is then localized and dissected free. The catheter is transected just distal to the cuff and subsequently removed in 2 pieces. The lower quadrant incision is then irrigated and closed in layers with Vicryl.. A 4-0 Monocryl was used close the skin. Dermabond is applied to the incision.   Antibiotic ointment and a sterile dressing is applied to the exit site. Patient tolerated procedure well and there were no complications.  The patient tolerated this procedure well.   COMPLICATIONS: None  CONDITION: Margaretmary Dys [facility]  Office: [phone]   08/14/2021, 10:15 AM

## 2021-08-14 NOTE — Anesthesia Procedure Notes (Signed)
Procedure Name: Intubation Date/Time: 08/14/2021 7:41 AM Performed by: Debe Coder, CRNA Pre-anesthesia Checklist: Patient identified, Emergency Drugs available, Suction available and Patient being monitored Patient Re-evaluated:Patient Re-evaluated prior to induction Oxygen Delivery Method: Circle system utilized Preoxygenation: Pre-oxygenation with 100% oxygen Induction Type: IV induction Ventilation: Mask ventilation without difficulty Laryngoscope Size: McGraph and 4 Grade View: Grade II Tube type: Oral Tube size: 7.0 mm Number of attempts: 1 Airway Equipment and Method: Stylet (BURP maneuver) Placement Confirmation: ETT inserted through vocal cords under direct vision, positive ETCO2 and breath sounds checked- equal and bilateral Secured at: 22 cm Tube secured with: Tape Dental Injury: Teeth and Oropharynx as per pre-operative assessment

## 2021-08-14 NOTE — Telephone Encounter (Signed)
Pt is calling to ask Santiago Glad if she can call the pharmacy. Pt picked up Fexofendine that was called in by Dr. Wynetta Emery. Pt got home can reviewed the medication. States that he does not take that medication and Pt will not allow the medication to be returned. Please advise [phone]
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
## 2021-08-14 NOTE — Anesthesia Postprocedure Evaluation (Signed)
Anesthesia Post Note  Patient: Luis Miller.  Procedure(s) Performed: INSERTION OF ARTERIOVENOUS (AV) GORE-TEX GRAFT ARM ( BRACHIAL CEPHALIC ) (Left) REMOVAL OF A DIALYSIS CATHETER  Patient location during evaluation: PACU Anesthesia Type: General Level of consciousness: awake and awake and alert Pain management: satisfactory to patient Vital Signs Assessment: post-procedure vital signs reviewed and stable Respiratory status: spontaneous breathing and respiratory function stable Anesthetic complications: no   No notable events documented.   Last Vitals:  Vitals:   08/14/21 1020 08/14/21 1030  BP: 124/72 120/72  Pulse: 72 73  Resp: 18   Temp: 36.4 C   SpO2: 100% 100%    Last Pain:  Vitals:   08/14/21 1030  PainSc: 0-No pain                 VAN STAVEREN,Aarohi Redditt

## 2021-08-14 NOTE — Transfer of Care (Signed)
Immediate Anesthesia Transfer of Care Note  Patient: Luis Miller.  Procedure(s) Performed: INSERTION OF ARTERIOVENOUS (AV) GORE-TEX GRAFT ARM ( BRACHIAL CEPHALIC ) (Left) REMOVAL OF A DIALYSIS CATHETER  Patient Location: PACU  Anesthesia Type:General  Level of Consciousness: awake and drowsy  Airway & Oxygen Therapy: Patient Spontanous Breathing and Patient connected to face mask oxygen  Post-op Assessment: Report given to RN and Post -op Vital signs reviewed and stable  Post vital signs: Reviewed and stable  Last Vitals:  Vitals Value Taken Time  BP 124/72 08/14/21 1015  Temp    Pulse 71 08/14/21 1019  Resp    SpO2 100 % 08/14/21 1019  Vitals shown include unvalidated device data.  Last Pain:  Vitals:   08/14/21 0642  PainSc: 0-No pain         Complications: No notable events documented.

## 2021-08-14 NOTE — Interval H&P Note (Signed)
History and Physical Interval Note:  08/14/2021 7:23 AM  [name]  has presented today for surgery, with the diagnosis of ESRD.  The various methods of treatment have been discussed with the patient and family. After consideration of risks, benefits and other options for treatment, the patient has consented to  Procedure(s): INSERTION OF ARTERIOVENOUS (AV) GORE-TEX GRAFT ARM ( BRACHIAL CEPHALIC ) (Left) REMOVAL OF A DIALYSIS CATHETER (N/A) as a surgical intervention.  The patient's history has been reviewed, patient examined, no change in status, stable for surgery.  I have reviewed the patient's chart and labs.  Questions were answered to the patient's satisfaction.     Hortencia Pilar

## 2021-08-14 NOTE — Discharge Instructions (Addendum)
AMBULATORY SURGERY  DISCHARGE INSTRUCTIONS   The drugs that you were given will stay in your system until tomorrow so for the next 24 hours you should not:  Drive an automobile Make any legal decisions Drink any alcoholic beverage   You may resume regular meals tomorrow.  Today it is better to start with liquids and gradually work up to solid foods.  You may eat anything you prefer, but it is better to start with liquids, then soup and crackers, and gradually work up to solid foods.   Please notify your doctor immediately if you have any unusual bleeding, trouble breathing, redness and pain at the surgery site, drainage, fever, or pain not relieved by medication.    Additional Instructions:    WEAR GREEN EXPAREL BRACELET FOR 4 DAYS        Please contact your physician with any problems or Same Day Surgery at [phone], Monday through Friday 6 am to 4 pm, or [place] at [facility] number at [phone].

## 2021-08-15 DIAGNOSIS — N186 End stage renal disease: Secondary | ICD-10-CM | POA: Diagnosis not present

## 2021-08-15 DIAGNOSIS — Z992 Dependence on renal dialysis: Secondary | ICD-10-CM | POA: Diagnosis not present

## 2021-08-18 ENCOUNTER — Telehealth: Payer: Self-pay

## 2021-08-18 DIAGNOSIS — Z992 Dependence on renal dialysis: Secondary | ICD-10-CM | POA: Diagnosis not present

## 2021-08-18 DIAGNOSIS — N186 End stage renal disease: Secondary | ICD-10-CM | POA: Diagnosis not present

## 2021-08-18 NOTE — Telephone Encounter (Signed)
Patient called and stated that he recently picked up a prescription for Fexofenadine ([place]) from the pharmacy. He states that he dose not take that medication anymore, it was changed to loratadine. He was unaware of the medication he had picked from the pharmacy, which was the Fexofenadine, and once he realized that it was wrong he tried to take it back to pharmacy, but they will not take it back. Patient paid out of pocket form medication and would like his money back. Please advise pt. Pt would like to know why this medication was not discontinued from his medication list.

## 2021-08-18 NOTE — Telephone Encounter (Signed)
Patient already picked up the medication from the pharmacy so I don't think we need to cancel it.  Also, I don't see where Dr. Wynetta Emery prescribed this.

## 2021-08-19 DIAGNOSIS — Z992 Dependence on renal dialysis: Secondary | ICD-10-CM | POA: Diagnosis not present

## 2021-08-19 DIAGNOSIS — N186 End stage renal disease: Secondary | ICD-10-CM | POA: Diagnosis not present

## 2021-08-19 NOTE — Telephone Encounter (Signed)
We do not have Allegra on his chart.  We have Claritin which was last sent on 07/29/21.  The last time Allegra was sent for him was back in February and it has been discontinued since then.

## 2021-08-20 DIAGNOSIS — N186 End stage renal disease: Secondary | ICD-10-CM | POA: Diagnosis not present

## 2021-08-20 DIAGNOSIS — Z992 Dependence on renal dialysis: Secondary | ICD-10-CM | POA: Diagnosis not present

## 2021-08-21 ENCOUNTER — Telehealth (INDEPENDENT_AMBULATORY_CARE_PROVIDER_SITE_OTHER): Payer: Self-pay | Admitting: Nurse Practitioner

## 2021-08-21 NOTE — Telephone Encounter (Signed)
It may just be from positioning during his surgery.  As long as his incision looks good and he doesn't have any fever it's ok

## 2021-08-21 NOTE — Telephone Encounter (Signed)
Patient was made aware with medical advice and verbalize understanding

## 2021-08-21 NOTE — Telephone Encounter (Signed)
Patient called stating that he has a bruise sort of on the back of arm. Not sure if its from his procedure or not. He states it red and warm to touch. Also says it looks busied from the elbow all the way down to the wrist almost. But states that the incision site still looks good. He says he just wanted to make [place] aware incase something could be wrong     Please call and advise

## 2021-08-22 DIAGNOSIS — N186 End stage renal disease: Secondary | ICD-10-CM | POA: Diagnosis not present

## 2021-08-22 DIAGNOSIS — Z992 Dependence on renal dialysis: Secondary | ICD-10-CM | POA: Diagnosis not present

## 2021-08-24 ENCOUNTER — Telehealth: Payer: Self-pay

## 2021-08-24 ENCOUNTER — Encounter: Payer: Self-pay | Admitting: Nurse Practitioner

## 2021-08-24 ENCOUNTER — Ambulatory Visit (INDEPENDENT_AMBULATORY_CARE_PROVIDER_SITE_OTHER): Payer: Medicare HMO | Admitting: Nurse Practitioner

## 2021-08-24 VITALS — BP 136/71 | HR 62 | Temp 98.1°F | Wt 190.6 lb

## 2021-08-24 DIAGNOSIS — I10 Essential (primary) hypertension: Secondary | ICD-10-CM

## 2021-08-24 DIAGNOSIS — E1129 Type 2 diabetes mellitus with other diabetic kidney complication: Secondary | ICD-10-CM | POA: Diagnosis not present

## 2021-08-24 DIAGNOSIS — N184 Chronic kidney disease, stage 4 (severe): Secondary | ICD-10-CM

## 2021-08-24 DIAGNOSIS — E782 Mixed hyperlipidemia: Secondary | ICD-10-CM

## 2021-08-24 DIAGNOSIS — N186 End stage renal disease: Secondary | ICD-10-CM | POA: Insufficient documentation

## 2021-08-24 DIAGNOSIS — N2581 Secondary hyperparathyroidism of renal origin: Secondary | ICD-10-CM | POA: Diagnosis not present

## 2021-08-24 DIAGNOSIS — E232 Diabetes insipidus: Secondary | ICD-10-CM | POA: Diagnosis not present

## 2021-08-24 DIAGNOSIS — Z992 Dependence on renal dialysis: Secondary | ICD-10-CM | POA: Diagnosis not present

## 2021-08-24 DIAGNOSIS — G4730 Sleep apnea, unspecified: Secondary | ICD-10-CM | POA: Insufficient documentation

## 2021-08-24 DIAGNOSIS — G4733 Obstructive sleep apnea (adult) (pediatric): Secondary | ICD-10-CM | POA: Insufficient documentation

## 2021-08-24 NOTE — Assessment & Plan Note (Signed)
Chronic. Now on Hemodialysis.  Continue to follow up with Nephrology.

## 2021-08-24 NOTE — Telephone Encounter (Signed)
CPAP compliance information has been faxed to [facility].

## 2021-08-24 NOTE — Progress Notes (Signed)
BP 136/71   Pulse 62   Temp 98.1 F (36.7 C)   Wt 190 lb 9.6 oz (86.5 kg)   SpO2 99%   BMI 30.76 kg/m    Subjective:    Patient ID: Luis Coss., male    DOB: 12/29/1945, 76 y.o.   MRN: [id number]  HPI: Luis Miller. is a 76 y.o. male  Chief Complaint  Patient presents with   Sleep Apnea    Patient states he uses his CPAP machine nightly   HYPERTENSION / HYPERLIPIDEMIA Satisfied with current treatment? no Duration of hypertension: years BP monitoring frequency: not checking BP range:  BP medication side effects: no Past BP meds:  hydralazine, amlodipine, and benazepril Duration of hyperlipidemia: years Cholesterol medication side effects: no Cholesterol supplements: none Past cholesterol medications: rosuvastatin (crestor) Medication compliance: excellent compliance Aspirin: no Recent stressors: no Recurrent headaches: no Visual changes: no Palpitations: no Dyspnea: no Chest pain: no Lower extremity edema: no Dizzy/lightheaded: no  CHRONIC KIDNEY DISEASE CKD status: controlled Medications renally dose: yes Previous renal evaluation: yes Pneumovax:  Up to Date Influenza Vaccine:  Not up to Date  SLEEP APNEA Sleep apnea status: controlled Duration: chronic Satisfied with current treatment?:  yes CPAP use:  yes Using CPAP nightly for 7-8 hours.  Has improvement with his sleep .   Relevant past medical, surgical, family and social history reviewed and updated as indicated. Interim medical history since our last visit reviewed. Allergies and medications reviewed and updated.  Review of Systems  Eyes:  Negative for visual disturbance.  Respiratory:  Negative for chest tightness and shortness of breath.   Cardiovascular:  Negative for chest pain, palpitations and leg swelling.  Skin:        New dialysis catheter  Neurological:  Negative for dizziness, light-headedness and headaches.  Psychiatric/Behavioral:  Negative for sleep disturbance.     Per HPI unless specifically indicated above     Objective:    BP 136/71   Pulse 62   Temp 98.1 F (36.7 C)   Wt 190 lb 9.6 oz (86.5 kg)   SpO2 99%   BMI 30.76 kg/m   Wt Readings from Last 3 Encounters:  08/24/21 190 lb 9.6 oz (86.5 kg)  07/29/21 193 lb 3.2 oz (87.6 kg)  07/27/21 192 lb (87.1 kg)    Physical Exam Vitals and nursing note reviewed.  Constitutional:      General: He is not in acute distress.    Appearance: Normal appearance. He is not ill-appearing, toxic-appearing or diaphoretic.  HENT:     Head: Normocephalic.     Right Ear: External ear normal.     Left Ear: External ear normal.     Nose: Nose normal. No congestion or rhinorrhea.     Mouth/Throat:     Mouth: Mucous membranes are moist.  Eyes:     General:        Right eye: No discharge.        Left eye: No discharge.     Extraocular Movements: Extraocular movements intact.     Conjunctiva/sclera: Conjunctivae normal.     Pupils: Pupils are equal, round, and reactive to light.  Cardiovascular:     Rate and Rhythm: Normal rate and regular rhythm.     Heart sounds: No murmur heard. Pulmonary:     Effort: Pulmonary effort is normal. No respiratory distress.     Breath sounds: Normal breath sounds. No wheezing, rhonchi or rales.  Abdominal:  General: Abdomen is flat. Bowel sounds are normal.  Musculoskeletal:     Cervical back: Normal range of motion and neck supple.  Skin:    General: Skin is warm and dry.     Capillary Refill: Capillary refill takes less than 2 seconds.  Neurological:     General: No focal deficit present.     Mental Status: He is alert and oriented to person, place, and time.  Psychiatric:        Mood and Affect: Mood normal.        Behavior: Behavior normal.        Thought Content: Thought content normal.        Judgment: Judgment normal.    Results for orders placed or performed during the hospital encounter of 08/14/21  I-STAT, chem 8  Result Value Ref Range    Sodium 142 135 - 145 mmol/L   Potassium 3.5 3.5 - 5.1 mmol/L   Chloride 99 98 - 111 mmol/L   BUN 25 (H) 8 - 23 mg/dL   Creatinine, Ser 3.70 (H) 0.61 - 1.24 mg/dL   Glucose, Bld 100 (H) 70 - 99 mg/dL   Calcium, Ion 1.06 (L) 1.15 - 1.40 mmol/L   TCO2 34 (H) 22 - 32 mmol/L   Hemoglobin 9.5 (L) 13.0 - 17.0 g/dL   HCT 28.0 (L) 39.0 - 52.0 %      Assessment & Plan:   Problem List Items Addressed This Visit       Cardiovascular and Mediastinum   Essential hypertension - Primary    Chronic.  Controlled.  Continue with current medication regimen.  Labs ordered today.  Return to clinic in 6 months for reevaluation.  Call sooner if concerns arise.           Respiratory   Sleep apnea    Chronic. Controlled.  Uses CPAP machine nightly for at least 7-8 hours.  Has seen an improvement in sleep quality.  Continue to use CPAP nightly.  Follow up in 6 months.  Call sooner if concerns arise.          Endocrine   Diabetes insipidus (HCC)    Chronic. Followed by Nephrology.  Continue to follow their recommendations.       Secondary hyperparathyroidism of renal origin (HCC)    Chronic, stable.  Managed by Nephrology.       Type 2 diabetes mellitus with other diabetic kidney complication (HCC)    Chronic. Has new AV fistula and receiving Hemodialysis.  Continue to follow up with Nephrology.  Labs have been stable.         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 Now on Hemodialysis.  Continue to follow up with Nephrology.       RESOLVED: CKD (chronic kidney disease) stage 4, GFR 15-29 ml/min (HCC)     Other   Hyperlipidemia    Chronic.  Controlled.  Continue with current medication regimen.  Labs ordered today.  Return to clinic in 6 months for reevaluation.  Call sooner if concerns arise.           Follow up plan: Return in about 6 months (around 02/23/2022) for HTN, HLD, DM2 FU.

## 2021-08-24 NOTE — Assessment & Plan Note (Signed)
Chronic. Controlled.  Uses CPAP machine nightly for at least 7-8 hours.  Has seen an improvement in sleep quality.  Continue to use CPAP nightly.  Follow up in 6 months.  Call sooner if concerns arise.

## 2021-08-24 NOTE — Assessment & Plan Note (Signed)
Chronic. Has new AV fistula and receiving Hemodialysis.  Continue to follow up with Nephrology.  Labs have been stable.

## 2021-08-24 NOTE — Assessment & Plan Note (Signed)
Chronic. Followed by Nephrology.  Continue to follow their recommendations.

## 2021-08-24 NOTE — Assessment & Plan Note (Signed)
Chronic.  Controlled.  Continue with current medication regimen.  Labs ordered today.  Return to clinic in 6 months for reevaluation.  Call sooner if concerns arise.  ? ?

## 2021-08-24 NOTE — Assessment & Plan Note (Signed)
Chronic, stable.  Managed by Nephrology.

## 2021-08-25 DIAGNOSIS — Z992 Dependence on renal dialysis: Secondary | ICD-10-CM | POA: Diagnosis not present

## 2021-08-25 DIAGNOSIS — N186 End stage renal disease: Secondary | ICD-10-CM | POA: Diagnosis not present

## 2021-08-27 DIAGNOSIS — Z992 Dependence on renal dialysis: Secondary | ICD-10-CM | POA: Diagnosis not present

## 2021-08-27 DIAGNOSIS — N186 End stage renal disease: Secondary | ICD-10-CM | POA: Diagnosis not present

## 2021-08-29 DIAGNOSIS — N186 End stage renal disease: Secondary | ICD-10-CM | POA: Diagnosis not present

## 2021-08-29 DIAGNOSIS — Z992 Dependence on renal dialysis: Secondary | ICD-10-CM | POA: Diagnosis not present

## 2021-09-01 DIAGNOSIS — N186 End stage renal disease: Secondary | ICD-10-CM | POA: Diagnosis not present

## 2021-09-01 DIAGNOSIS — G4733 Obstructive sleep apnea (adult) (pediatric): Secondary | ICD-10-CM | POA: Diagnosis not present

## 2021-09-01 DIAGNOSIS — Z992 Dependence on renal dialysis: Secondary | ICD-10-CM | POA: Diagnosis not present

## 2021-09-03 DIAGNOSIS — Z992 Dependence on renal dialysis: Secondary | ICD-10-CM | POA: Diagnosis not present

## 2021-09-03 DIAGNOSIS — N186 End stage renal disease: Secondary | ICD-10-CM | POA: Diagnosis not present

## 2021-09-05 DIAGNOSIS — N186 End stage renal disease: Secondary | ICD-10-CM | POA: Diagnosis not present

## 2021-09-05 DIAGNOSIS — Z992 Dependence on renal dialysis: Secondary | ICD-10-CM | POA: Diagnosis not present

## 2021-09-06 ENCOUNTER — Other Ambulatory Visit: Payer: Self-pay | Admitting: Psychiatry

## 2021-09-06 DIAGNOSIS — F3176 Bipolar disorder, in full remission, most recent episode depressed: Secondary | ICD-10-CM

## 2021-09-08 ENCOUNTER — Encounter: Payer: Self-pay | Admitting: Nurse Practitioner

## 2021-09-08 ENCOUNTER — Ambulatory Visit (INDEPENDENT_AMBULATORY_CARE_PROVIDER_SITE_OTHER): Payer: Medicare Other | Admitting: Nurse Practitioner

## 2021-09-08 VITALS — BP 136/60 | HR 59 | Temp 98.1°F | Ht 66.0 in | Wt 187.2 lb

## 2021-09-08 DIAGNOSIS — L03116 Cellulitis of left lower limb: Secondary | ICD-10-CM

## 2021-09-08 DIAGNOSIS — Z992 Dependence on renal dialysis: Secondary | ICD-10-CM | POA: Diagnosis not present

## 2021-09-08 DIAGNOSIS — Z23 Encounter for immunization: Secondary | ICD-10-CM | POA: Diagnosis not present

## 2021-09-08 DIAGNOSIS — N186 End stage renal disease: Secondary | ICD-10-CM | POA: Diagnosis not present

## 2021-09-08 MED ORDER — DOXYCYCLINE HYCLATE 100 MG PO TABS
100.0000 mg | ORAL_TABLET | Freq: Two times a day (BID) | ORAL | 0 refills | Status: AC
Start: 2021-09-08 — End: 2021-09-15

## 2021-09-08 MED ORDER — MUPIROCIN 2 % EX OINT
1.0000 | TOPICAL_OINTMENT | Freq: Two times a day (BID) | CUTANEOUS | 0 refills | Status: DC
Start: 2021-09-08 — End: 2021-12-25

## 2021-09-08 NOTE — Progress Notes (Signed)
BP 136/60 (BP Location: Left Arm, Cuff Size: Normal)   Pulse (!) 59   Temp 98.1 F (36.7 C) (Oral)   Ht 5' 6"  (1.676 m)   Wt 187 lb 3.2 oz (84.9 kg)   SpO2 96%   BMI 30.21 kg/m    Subjective:    Patient ID: Luis Miller., male    DOB: [birth year], 76 y.o.   MRN: [id number]  HPI: Luis Miller. is a 76 y.o. male  Chief Complaint  Patient presents with   Scar    Patient is here for scar on shin. Patient states he scraped his chin getting into his truck. Patient state he has been treating the area with an antibiotic for a little over a week. Patient states he notices some drainage that is causing his bandages to stick to the area.    SKIN INFECTION (LEFT SHIN) Scraped his shin while getting into truck one week ago.  Has been using triple abx and Gentamicin ointment to area.  Notices some drainage to area.  Only has a little pain when rubs against sheets.  Is going for hemodialysis and has T2DM, recent A1c stable at 5.7%. Duration: days Location: chin History of trauma in area: yes Redness: no Swelling: no Oozing: yes Pus: no Fevers: no Nausea/vomiting: no Status: stable Treatments attempted:antibiotics  Tetanus: Not UTD   Relevant past medical, surgical, family and social history reviewed and updated as indicated. Interim medical history since our last visit reviewed. Allergies and medications reviewed and updated.  Review of Systems  Constitutional:  Negative for activity change, diaphoresis, fatigue and fever.  Respiratory:  Negative for cough, chest tightness, shortness of breath and wheezing.   Cardiovascular:  Negative for chest pain, palpitations and leg swelling.  Skin:  Positive for wound.  Neurological: Negative.   Psychiatric/Behavioral: Negative.      Per HPI unless specifically indicated above     Objective:    BP 136/60 (BP Location: Left Arm, Cuff Size: Normal)   Pulse (!) 59   Temp 98.1 F (36.7 C) (Oral)   Ht 5' 6"  (1.676 m)   Wt 187  lb 3.2 oz (84.9 kg)   SpO2 96%   BMI 30.21 kg/m   Wt Readings from Last 3 Encounters:  09/08/21 187 lb 3.2 oz (84.9 kg)  08/24/21 190 lb 9.6 oz (86.5 kg)  07/29/21 193 lb 3.2 oz (87.6 kg)    Physical Exam Vitals and nursing note reviewed.  Constitutional:      General: He is awake. He is not in acute distress.    Appearance: He is well-developed and well-groomed. He is obese. He is not ill-appearing or toxic-appearing.  HENT:     Head: Normocephalic and atraumatic.     Right Ear: Hearing normal. No drainage.     Left Ear: Hearing normal. No drainage.  Eyes:     General: Lids are normal.        Right eye: No discharge.        Left eye: No discharge.     Conjunctiva/sclera: Conjunctivae normal.     Pupils: Pupils are equal, round, and reactive to light.  Neck:     Vascular: No carotid bruit.  Cardiovascular:     Rate and Rhythm: Normal rate and regular rhythm.     Heart sounds: Normal heart sounds, S1 normal and S2 normal. No murmur heard.    No gallop.  Pulmonary:     Effort: Pulmonary effort is normal. No  accessory muscle usage or respiratory distress.     Breath sounds: Normal breath sounds.  Abdominal:     General: Bowel sounds are normal.     Palpations: Abdomen is soft.  Musculoskeletal:        General: Normal range of motion.     Cervical back: Normal range of motion and neck supple.     Right lower leg: No edema.     Left lower leg: No edema.  Skin:    General: Skin is warm and dry.     Capillary Refill: Capillary refill takes less than 2 seconds.     Findings: Wound present.       Neurological:     Mental Status: He is alert and oriented to person, place, and time.  Psychiatric:        Attention and Perception: Attention normal.        Mood and Affect: Mood normal.        Speech: Speech normal.        Behavior: Behavior normal. Behavior is cooperative.        Thought Content: Thought content normal.    Results for orders placed or performed during the  hospital encounter of 08/14/21  I-STAT, chem 8  Result Value Ref Range   Sodium 142 135 - 145 mmol/L   Potassium 3.5 3.5 - 5.1 mmol/L   Chloride 99 98 - 111 mmol/L   BUN 25 (H) 8 - 23 mg/dL   Creatinine, Ser 3.70 (H) 0.61 - 1.24 mg/dL   Glucose, Bld 100 (H) 70 - 99 mg/dL   Calcium, Ion 1.06 (L) 1.15 - 1.40 mmol/L   TCO2 34 (H) 22 - 32 mmol/L   Hemoglobin 9.5 (L) 13.0 - 17.0 g/dL   HCT 28.0 (L) 39.0 - 52.0 %      Assessment & Plan:   Problem List Items Addressed This Visit       Other   Cellulitis of left lower leg - Primary    Wound present for one week after abrasion against metal on truck, will update Td today.  Concern for worsening infection due to his ESRD and diabetes.  At this time start Doxycycline 100 MG BID for 7 days (okay with hemodialysis) and Mupirocin ointment applied to wound BID.  Use non adherent dressings and cleanse wound well.  Recommend he monitor redness and swelling closely, if worsening notify provider immediately.  Return in one week for follow-up.      Relevant Orders   Td vaccine greater than or equal to 7yo preservative free IM (Completed)   Other Visit Diagnoses     Need for Td vaccine       Td vaccine updated today due to recent abrasion against metal on truck.   Relevant Orders   Td vaccine greater than or equal to 7yo preservative free IM (Completed)        Follow up plan: Return in about 1 week (around 09/15/2021) for Shin wound left side.

## 2021-09-08 NOTE — Patient Instructions (Signed)

## 2021-09-08 NOTE — Assessment & Plan Note (Signed)
Wound present for one week after abrasion against metal on truck, will update Td today.  Concern for worsening infection due to his ESRD and diabetes.  At this time start Doxycycline 100 MG BID for 7 days (okay with hemodialysis) and Mupirocin ointment applied to wound BID.  Use non adherent dressings and cleanse wound well.  Recommend he monitor redness and swelling closely, if worsening notify provider immediately.  Return in one week for follow-up.

## 2021-09-10 DIAGNOSIS — N186 End stage renal disease: Secondary | ICD-10-CM | POA: Diagnosis not present

## 2021-09-10 DIAGNOSIS — Z992 Dependence on renal dialysis: Secondary | ICD-10-CM | POA: Diagnosis not present

## 2021-09-12 DIAGNOSIS — Z992 Dependence on renal dialysis: Secondary | ICD-10-CM | POA: Diagnosis not present

## 2021-09-12 DIAGNOSIS — N186 End stage renal disease: Secondary | ICD-10-CM | POA: Diagnosis not present

## 2021-09-14 ENCOUNTER — Ambulatory Visit (INDEPENDENT_AMBULATORY_CARE_PROVIDER_SITE_OTHER): Payer: Medicare HMO | Admitting: Nurse Practitioner

## 2021-09-14 ENCOUNTER — Encounter: Payer: Self-pay | Admitting: Nurse Practitioner

## 2021-09-14 DIAGNOSIS — L03116 Cellulitis of left lower limb: Secondary | ICD-10-CM | POA: Diagnosis not present

## 2021-09-14 NOTE — Progress Notes (Signed)
BP (!) 104/55 (BP Location: Left Arm, Cuff Size: Normal)   Pulse 74   Temp 99 F (37.2 C) (Oral)   Ht 5\' 6"  (1.676 m)   Wt 193 lb (87.5 kg)   SpO2 94%   BMI 31.15 kg/m    Subjective:    Patient ID: Luis Miller., male    DOB: [birth year], 76 y.o.   MRN: [id number]  HPI: Garhett Valla. is a 76 y.o. male  Chief Complaint  Patient presents with   Wound Check    Patient is here for a one week follow up on his wound. Patient says that everything is going well.    SKIN INFECTION (LEFT SHIN) Follow-up for leg wound. At visit on 09/08/21 started on Doxycycline and Mupirocin for infection.  Initially scraped his shin while getting into truck a little over one week ago.  Has been using triple abx and Gentamicin ointment to area.  Is going for hemodialysis and has T2DM, recent A1c stable at 5.7%.  He reports no longer draining and redness pretty much gone.  Denies any pain to area.   Duration: days Location: chin History of trauma in area: yes Redness: no Swelling: no Oozing: no longer present Pus: no Fevers: no Nausea/vomiting: no Status: improved Treatments attempted:antibiotics  Tetanus: UTD  Relevant past medical, surgical, family and social history reviewed and updated as indicated. Interim medical history since our last visit reviewed. Allergies and medications reviewed and updated.  Review of Systems  Constitutional:  Negative for activity change, diaphoresis, fatigue and fever.  Respiratory:  Negative for cough, chest tightness, shortness of breath and wheezing.   Cardiovascular:  Negative for chest pain, palpitations and leg swelling.  Skin:  Positive for wound.  Neurological: Negative.   Psychiatric/Behavioral: Negative.      Per HPI unless specifically indicated above     Objective:    BP (!) 104/55 (BP Location: Left Arm, Cuff Size: Normal)   Pulse 74   Temp 99 F (37.2 C) (Oral)   Ht 5\' 6"  (1.676 m)   Wt 193 lb (87.5 kg)   SpO2 94%   BMI 31.15  kg/m   Wt Readings from Last 3 Encounters:  09/14/21 193 lb (87.5 kg)  09/08/21 187 lb 3.2 oz (84.9 kg)  08/24/21 190 lb 9.6 oz (86.5 kg)    Physical Exam Vitals and nursing note reviewed.  Constitutional:      General: He is awake. He is not in acute distress.    Appearance: He is well-developed and well-groomed. He is obese. He is not ill-appearing or toxic-appearing.  HENT:     Head: Normocephalic and atraumatic.     Right Ear: Hearing normal. No drainage.     Left Ear: Hearing normal. No drainage.  Eyes:     General: Lids are normal.        Right eye: No discharge.        Left eye: No discharge.     Conjunctiva/sclera: Conjunctivae normal.     Pupils: Pupils are equal, round, and reactive to light.  Neck:     Vascular: No carotid bruit.  Cardiovascular:     Rate and Rhythm: Normal rate and regular rhythm.     Heart sounds: Normal heart sounds, S1 normal and S2 normal. No murmur heard.    No gallop.  Pulmonary:     Effort: Pulmonary effort is normal. No accessory muscle usage or respiratory distress.     Breath sounds: Normal  breath sounds.  Abdominal:     General: Bowel sounds are normal.     Palpations: Abdomen is soft.  Musculoskeletal:        General: Normal range of motion.     Cervical back: Normal range of motion and neck supple.     Right lower leg: No edema.     Left lower leg: No edema.  Skin:    General: Skin is warm and dry.     Capillary Refill: Capillary refill takes less than 2 seconds.     Findings: Wound present.       Neurological:     Mental Status: He is alert and oriented to person, place, and time.  Psychiatric:        Attention and Perception: Attention normal.        Mood and Affect: Mood normal.        Speech: Speech normal.        Behavior: Behavior normal. Behavior is cooperative.        Thought Content: Thought content normal.    Results for orders placed or performed during the hospital encounter of 08/14/21  I-STAT, chem 8   Result Value Ref Range   Sodium 142 135 - 145 mmol/L   Potassium 3.5 3.5 - 5.1 mmol/L   Chloride 99 98 - 111 mmol/L   BUN 25 (H) 8 - 23 mg/dL   Creatinine, Ser 4.09 (H) 0.61 - 1.24 mg/dL   Glucose, Bld 811 (H) 70 - 99 mg/dL   Calcium, Ion 9.14 (L) 1.15 - 1.40 mmol/L   TCO2 34 (H) 22 - 32 mmol/L   Hemoglobin 9.5 (L) 13.0 - 17.0 g/dL   HCT 78.2 (L) 95.6 - 21.3 %      Assessment & Plan:   Problem List Items Addressed This Visit       Other   Cellulitis of left lower leg    Acute and improved at this time.  Overall no further redness, warmth, or edema.  Crusting present to full wound.  Recommend he continue oral abx until completed and continue Mupirocin to wound.  May not open area to air and remove dressing during daytime.  Advised him if any return of worsening then to notify provider immediately.        Follow up plan: Return for return as scheduled in September with Clydie Braun.

## 2021-09-15 DIAGNOSIS — Z992 Dependence on renal dialysis: Secondary | ICD-10-CM | POA: Diagnosis not present

## 2021-09-15 DIAGNOSIS — N186 End stage renal disease: Secondary | ICD-10-CM | POA: Diagnosis not present

## 2021-09-16 ENCOUNTER — Encounter (INDEPENDENT_AMBULATORY_CARE_PROVIDER_SITE_OTHER): Payer: Medicare HMO

## 2021-09-16 ENCOUNTER — Ambulatory Visit (INDEPENDENT_AMBULATORY_CARE_PROVIDER_SITE_OTHER): Payer: Medicare HMO | Admitting: Nurse Practitioner

## 2021-09-16 DIAGNOSIS — D485 Neoplasm of uncertain behavior of skin: Secondary | ICD-10-CM | POA: Diagnosis not present

## 2021-09-16 DIAGNOSIS — C44629 Squamous cell carcinoma of skin of left upper limb, including shoulder: Secondary | ICD-10-CM | POA: Diagnosis not present

## 2021-09-16 DIAGNOSIS — Z85828 Personal history of other malignant neoplasm of skin: Secondary | ICD-10-CM | POA: Diagnosis not present

## 2021-09-16 DIAGNOSIS — L814 Other melanin hyperpigmentation: Secondary | ICD-10-CM | POA: Diagnosis not present

## 2021-09-16 DIAGNOSIS — D2261 Melanocytic nevi of right upper limb, including shoulder: Secondary | ICD-10-CM | POA: Diagnosis not present

## 2021-09-16 DIAGNOSIS — D2262 Melanocytic nevi of left upper limb, including shoulder: Secondary | ICD-10-CM | POA: Diagnosis not present

## 2021-09-16 DIAGNOSIS — L821 Other seborrheic keratosis: Secondary | ICD-10-CM | POA: Diagnosis not present

## 2021-09-16 DIAGNOSIS — Z85820 Personal history of malignant melanoma of skin: Secondary | ICD-10-CM | POA: Diagnosis not present

## 2021-09-17 DIAGNOSIS — Z992 Dependence on renal dialysis: Secondary | ICD-10-CM | POA: Diagnosis not present

## 2021-09-17 DIAGNOSIS — N186 End stage renal disease: Secondary | ICD-10-CM | POA: Diagnosis not present

## 2021-09-18 ENCOUNTER — Ambulatory Visit (INDEPENDENT_AMBULATORY_CARE_PROVIDER_SITE_OTHER): Payer: Medicare HMO | Admitting: Nurse Practitioner

## 2021-09-18 ENCOUNTER — Encounter (INDEPENDENT_AMBULATORY_CARE_PROVIDER_SITE_OTHER): Payer: Self-pay | Admitting: Nurse Practitioner

## 2021-09-18 ENCOUNTER — Ambulatory Visit (INDEPENDENT_AMBULATORY_CARE_PROVIDER_SITE_OTHER): Payer: Medicare HMO

## 2021-09-18 ENCOUNTER — Encounter (INDEPENDENT_AMBULATORY_CARE_PROVIDER_SITE_OTHER): Payer: Medicare HMO

## 2021-09-18 ENCOUNTER — Other Ambulatory Visit (INDEPENDENT_AMBULATORY_CARE_PROVIDER_SITE_OTHER): Payer: Self-pay | Admitting: Vascular Surgery

## 2021-09-18 VITALS — BP 101/60 | HR 81 | Resp 16 | Ht 66.0 in | Wt 186.0 lb

## 2021-09-18 DIAGNOSIS — Z9889 Other specified postprocedural states: Secondary | ICD-10-CM

## 2021-09-18 DIAGNOSIS — N186 End stage renal disease: Secondary | ICD-10-CM

## 2021-09-18 DIAGNOSIS — Z992 Dependence on renal dialysis: Secondary | ICD-10-CM | POA: Diagnosis not present

## 2021-09-18 DIAGNOSIS — E1129 Type 2 diabetes mellitus with other diabetic kidney complication: Secondary | ICD-10-CM

## 2021-09-19 DIAGNOSIS — Z992 Dependence on renal dialysis: Secondary | ICD-10-CM | POA: Diagnosis not present

## 2021-09-19 DIAGNOSIS — N186 End stage renal disease: Secondary | ICD-10-CM | POA: Diagnosis not present

## 2021-09-22 DIAGNOSIS — Z992 Dependence on renal dialysis: Secondary | ICD-10-CM | POA: Diagnosis not present

## 2021-09-22 DIAGNOSIS — N186 End stage renal disease: Secondary | ICD-10-CM | POA: Diagnosis not present

## 2021-09-23 ENCOUNTER — Telehealth (INDEPENDENT_AMBULATORY_CARE_PROVIDER_SITE_OTHER): Payer: Self-pay

## 2021-09-23 NOTE — Telephone Encounter (Signed)
Spoke with the patient and he is scheduled with Dr. Lucky Cowboy for a LUE fistulagram with a 10:30 am arrival time to the [facility]. Pre-procedure instructions were discussed and will be mailed.

## 2021-09-24 DIAGNOSIS — N186 End stage renal disease: Secondary | ICD-10-CM | POA: Diagnosis not present

## 2021-09-24 DIAGNOSIS — G4733 Obstructive sleep apnea (adult) (pediatric): Secondary | ICD-10-CM | POA: Diagnosis not present

## 2021-09-24 DIAGNOSIS — L905 Scar conditions and fibrosis of skin: Secondary | ICD-10-CM | POA: Diagnosis not present

## 2021-09-24 DIAGNOSIS — Z992 Dependence on renal dialysis: Secondary | ICD-10-CM | POA: Diagnosis not present

## 2021-09-24 DIAGNOSIS — C44629 Squamous cell carcinoma of skin of left upper limb, including shoulder: Secondary | ICD-10-CM | POA: Diagnosis not present

## 2021-09-26 DIAGNOSIS — Z992 Dependence on renal dialysis: Secondary | ICD-10-CM | POA: Diagnosis not present

## 2021-09-26 DIAGNOSIS — N186 End stage renal disease: Secondary | ICD-10-CM | POA: Diagnosis not present

## 2021-09-27 ENCOUNTER — Encounter (INDEPENDENT_AMBULATORY_CARE_PROVIDER_SITE_OTHER): Payer: Self-pay | Admitting: Nurse Practitioner
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
## 2021-09-28 ENCOUNTER — Encounter
Admission: RE | Disposition: A | Discharge status: Home / Self Care | Payer: Self-pay | Source: Ambulatory Visit | Attending: Vascular Surgery

## 2021-09-28 ENCOUNTER — Ambulatory Visit
Admission: RE | Admit: 2021-09-28 | Discharge: 2021-09-28 | Disposition: A | Discharge status: Home / Self Care | Payer: Medicare HMO | Source: Ambulatory Visit | Attending: Vascular Surgery | Admitting: Vascular Surgery

## 2021-09-28 ENCOUNTER — Encounter: Payer: Self-pay | Admitting: Vascular Surgery

## 2021-09-28 DIAGNOSIS — Z992 Dependence on renal dialysis: Secondary | ICD-10-CM | POA: Insufficient documentation

## 2021-09-28 DIAGNOSIS — Y841 Kidney dialysis as the cause of abnormal reaction of the patient, or of later complication, without mention of misadventure at the time of the procedure: Secondary | ICD-10-CM | POA: Diagnosis not present

## 2021-09-28 DIAGNOSIS — T82868A Thrombosis of vascular prosthetic devices, implants and grafts, initial encounter: Secondary | ICD-10-CM | POA: Diagnosis not present

## 2021-09-28 DIAGNOSIS — E1122 Type 2 diabetes mellitus with diabetic chronic kidney disease: Secondary | ICD-10-CM | POA: Diagnosis not present

## 2021-09-28 DIAGNOSIS — T82898A Other specified complication of vascular prosthetic devices, implants and grafts, initial encounter: Secondary | ICD-10-CM | POA: Diagnosis present

## 2021-09-28 DIAGNOSIS — I120 Hypertensive chronic kidney disease with stage 5 chronic kidney disease or end stage renal disease: Secondary | ICD-10-CM | POA: Diagnosis not present

## 2021-09-28 DIAGNOSIS — Z87891 Personal history of nicotine dependence: Secondary | ICD-10-CM | POA: Insufficient documentation

## 2021-09-28 DIAGNOSIS — N186 End stage renal disease: Secondary | ICD-10-CM | POA: Insufficient documentation

## 2021-09-28 HISTORY — PX: A/V FISTULAGRAM: CATH118298

## 2021-09-28 LAB — POTASSIUM (ARMC VASCULAR LAB ONLY): Potassium (ARMC vascular lab): 3.6 mmol/L (ref 3.5–5.1)

## 2021-09-28 SURGERY — A/V FISTULAGRAM
Anesthesia: Moderate Sedation | Laterality: Left

## 2021-09-28 MED ORDER — FENTANYL CITRATE (PF) 100 MCG/2ML IJ SOLN
INTRAMUSCULAR | Status: DC | PRN
  Administered 2021-09-28: 50 ug via INTRAVENOUS

## 2021-09-28 MED ORDER — DIPHENHYDRAMINE HCL 50 MG/ML IJ SOLN: 50.0000 mg | Freq: Once | INTRAMUSCULAR | Status: DC | PRN

## 2021-09-28 MED ORDER — FAMOTIDINE 20 MG PO TABS: 40.0000 mg | ORAL_TABLET | Freq: Once | ORAL | Status: DC | PRN

## 2021-09-28 MED ORDER — HYDROMORPHONE HCL 1 MG/ML IJ SOLN: 1.0000 mg | Freq: Once | INTRAMUSCULAR | Status: DC | PRN

## 2021-09-28 MED ORDER — MIDAZOLAM HCL 2 MG/ML PO SYRP: 8.0000 mg | ORAL_SOLUTION | Freq: Once | ORAL | Status: DC | PRN

## 2021-09-28 MED ORDER — CEFAZOLIN SODIUM-DEXTROSE 1-4 GM/50ML-% IV SOLN: 1.0000 g | INTRAVENOUS | Status: AC

## 2021-09-28 MED ORDER — MIDAZOLAM HCL 2 MG/2ML IJ SOLN
INTRAMUSCULAR | Status: DC | PRN
  Administered 2021-09-28: 1 mg via INTRAVENOUS

## 2021-09-28 MED ORDER — SODIUM CHLORIDE 0.9 % IV SOLN
INTRAVENOUS | Status: DC
  Administered 2021-09-28: 1000 mL via INTRAVENOUS

## 2021-09-28 MED ORDER — METHYLPREDNISOLONE SODIUM SUCC 125 MG IJ SOLR: 125.0000 mg | Freq: Once | INTRAMUSCULAR | Status: DC | PRN

## 2021-09-28 MED ORDER — IODIXANOL 320 MG/ML IV SOLN
INTRAVENOUS | Status: DC | PRN
  Administered 2021-09-28: 25 mL via INTRAVENOUS

## 2021-09-28 MED ORDER — CEFAZOLIN SODIUM-DEXTROSE 1-4 GM/50ML-% IV SOLN
INTRAVENOUS | Status: AC
  Administered 2021-09-28: 1 g via INTRAVENOUS
  Filled 2021-09-28: qty 50

## 2021-09-28 MED ORDER — HEPARIN SODIUM (PORCINE) 1000 UNIT/ML IJ SOLN
INTRAMUSCULAR | Status: AC
  Filled 2021-09-28: qty 10

## 2021-09-28 MED ORDER — MIDAZOLAM HCL 2 MG/2ML IJ SOLN
INTRAMUSCULAR | Status: AC
  Filled 2021-09-28: qty 2

## 2021-09-28 MED ORDER — HEPARIN SODIUM (PORCINE) 1000 UNIT/ML IJ SOLN
INTRAMUSCULAR | Status: DC | PRN
  Administered 2021-09-28: 3000 [IU] via INTRAVENOUS

## 2021-09-28 MED ORDER — ONDANSETRON HCL 4 MG/2ML IJ SOLN: 4.0000 mg | Freq: Four times a day (QID) | INTRAMUSCULAR | Status: DC | PRN

## 2021-09-28 MED ORDER — FENTANYL CITRATE (PF) 100 MCG/2ML IJ SOLN
INTRAMUSCULAR | Status: AC
  Filled 2021-09-28: qty 2

## 2021-09-28 SURGICAL SUPPLY — 15 items
CANNULA 5F STIFF (CANNULA) ×1 IMPLANT
CATH BEACON 5 .035 40 KMP TP (CATHETERS) IMPLANT
CATH BEACON 5 .038 40 KMP TP (CATHETERS) ×1
CATH MICROCATH PRGRT 2.8F 110 (CATHETERS) IMPLANT
COIL 400 COMPLEX SOFT 6X20CM (Vascular Products) IMPLANT
COIL 400 COMPLEX SOFT 8X35CM (Vascular Products) ×1 IMPLANT
COIL 400 COMPLEX STD 6X20CM (Vascular Products) ×1 IMPLANT
COVER PROBE U/S 5X48 (MISCELLANEOUS) ×1 IMPLANT
DRAPE BRACHIAL (DRAPES) ×1 IMPLANT
GLIDEWIRE ADV .035X180CM (WIRE) ×1 IMPLANT
HANDLE DETACHMENT COIL (MISCELLANEOUS) ×1 IMPLANT
MICROCATH PROGREAT 2.8F 110 CM (CATHETERS) ×4
PACK ANGIOGRAPHY (CUSTOM PROCEDURE TRAY) ×2 IMPLANT
SHEATH BRITE TIP 6FRX5.5 (SHEATH) ×1 IMPLANT
SUT MNCRL AB 4-0 PS2 18 (SUTURE) ×1 IMPLANT

## 2021-09-28 NOTE — Interval H&P Note (Signed)
History and Physical Interval Note:  09/28/2021 10:50 AM  Luis Miller.  has presented today for surgery, with the diagnosis of LUE Fistulagram   End Stage Renal.  The various methods of treatment have been discussed with the patient and family. After consideration of risks, benefits and other options for treatment, the patient has consented to  Procedure(s): A/V Fistulagram (Left) as a surgical intervention.  The patient's history has been reviewed, patient examined, no change in status, stable for surgery.  I have reviewed the patient's chart and labs.  Questions were answered to the patient's satisfaction.     Leotis Pain

## 2021-09-28 NOTE — Op Note (Signed)
[facility]    OPERATIVE NOTE   PROCEDURE: 1.   Left brachiocephalic arteriovenous fistula cannulation under ultrasound guidance 2.   Left arm fistulagram including central venogram 3.   Coil embolization with a 6 mm and an 8 mm Ruby coil  PRE-OPERATIVE DIAGNOSIS: 1. ESRD 2. Poorly functional left brachiocephalic AVF  POST-OPERATIVE DIAGNOSIS: same as above with long segment occlusion of the cephalic vein with innumerable collaterals creating an nonsalvageable AV fistula  SURGEON: Leotis Pain, MD  ANESTHESIA: local with MCS  ESTIMATED BLOOD LOSS: 5 cc  FINDING(S): Long segment occlusion of the cephalic vein starting about 5 cm beyond the anastomosis with reconstitution near the cephalic vein subclavian vein confluence.  The remainder of central venous circulation appeared patent.  There were innumerable collaterals.  There was also extravasation from the cephalic vein likely just from the access wire.  SPECIMEN(S):  None  CONTRAST: 25 cc  FLUORO TIME: 3.6 minutes  MODERATE CONSCIOUS SEDATION TIME: Approximately 25 minutes with 1 mg of Versed and 50 mcg of Fentanyl   INDICATIONS: Luis Miller. is a 76 y.o. male who presents with malfunctioning left brachiocephalic arteriovenous fistula.  The patient is scheduled for left arm fistulagram.  The patient is aware the risks include but are not limited to: bleeding, infection, thrombosis of the cannulated access, and possible anaphylactic reaction to the contrast.  The patient is aware of the risks of the procedure and elects to proceed forward.  DESCRIPTION: After full informed written consent was obtained, the patient was brought back to the angiography suite and placed supine upon the angiography table.  The patient was connected to monitoring equipment. Moderate conscious sedation was administered with a face to face encounter with the patient throughout the procedure with my supervision of the RN  administering medicines and monitoring the patient's vital signs and mental status throughout from the start of the procedure until the patient was taken to the recovery room. The left arm was prepped and draped in the standard fashion for a percutaneous access intervention.  Under ultrasound guidance, the left brachiocephalic arteriovenous fistula was cannulated with a micropuncture needle under direct ultrasound guidance where it was patent and a permanent image was performed.  The microwire was advanced into the fistula and the needle was exchanged for the a microsheath.  I then upsized to a 6 Fr Sheath and imaging was performed.  Hand injections were completed to image the access including the central venous system. This demonstrated Long segment occlusion of the cephalic vein starting about 5 cm beyond the anastomosis with reconstitution near the cephalic vein subclavian vein confluence.  The remainder of central venous circulation appeared patent.  There were innumerable collaterals.  There was also extravasation from the cephalic vein likely just from the access wire.  I used an advantage wire and Kumpe catheter but it was clear this was not going to cross this long segment cephalic vein occlusion and this fistula was not going to be salvageable.  There was still extravasation so I elected to place a pair of Ruby coils to close this down and I used a 6 mm and an 8 mm diameter coil and no further extravasation was seen.    At this point, I elected to terminate the procedure.  A 4-0 Monocryl purse-string suture was sewn around the sheath.  The sheath was removed while tying down the suture.  A sterile bandage was applied to the puncture site.  COMPLICATIONS: None  CONDITION:  Stable   Leotis Pain  09/28/2021 12:31 PM   This note was created with Dragon Medical transcription system. Any errors in dictation are purely unintentional.

## 2021-09-29 DIAGNOSIS — Z992 Dependence on renal dialysis: Secondary | ICD-10-CM | POA: Diagnosis not present

## 2021-09-29 DIAGNOSIS — N186 End stage renal disease: Secondary | ICD-10-CM | POA: Diagnosis not present

## 2021-10-01 DIAGNOSIS — N186 End stage renal disease: Secondary | ICD-10-CM | POA: Diagnosis not present

## 2021-10-01 DIAGNOSIS — G4733 Obstructive sleep apnea (adult) (pediatric): Secondary | ICD-10-CM | POA: Diagnosis not present

## 2021-10-01 DIAGNOSIS — Z992 Dependence on renal dialysis: Secondary | ICD-10-CM | POA: Diagnosis not present

## 2021-10-03 DIAGNOSIS — Z992 Dependence on renal dialysis: Secondary | ICD-10-CM | POA: Diagnosis not present

## 2021-10-03 DIAGNOSIS — N186 End stage renal disease: Secondary | ICD-10-CM | POA: Diagnosis not present

## 2021-10-06 DIAGNOSIS — Z992 Dependence on renal dialysis: Secondary | ICD-10-CM | POA: Diagnosis not present

## 2021-10-06 DIAGNOSIS — N186 End stage renal disease: Secondary | ICD-10-CM | POA: Diagnosis not present

## 2021-10-08 DIAGNOSIS — Z992 Dependence on renal dialysis: Secondary | ICD-10-CM | POA: Diagnosis not present

## 2021-10-08 DIAGNOSIS — N186 End stage renal disease: Secondary | ICD-10-CM | POA: Diagnosis not present

## 2021-10-10 DIAGNOSIS — Z992 Dependence on renal dialysis: Secondary | ICD-10-CM | POA: Diagnosis not present

## 2021-10-10 DIAGNOSIS — N186 End stage renal disease: Secondary | ICD-10-CM | POA: Diagnosis not present

## 2021-10-11 ENCOUNTER — Other Ambulatory Visit: Payer: Self-pay | Admitting: Psychiatry

## 2021-10-11 DIAGNOSIS — F3176 Bipolar disorder, in full remission, most recent episode depressed: Secondary | ICD-10-CM

## 2021-10-12 ENCOUNTER — Telehealth (INDEPENDENT_AMBULATORY_CARE_PROVIDER_SITE_OTHER): Payer: Self-pay

## 2021-10-12 ENCOUNTER — Ambulatory Visit (INDEPENDENT_AMBULATORY_CARE_PROVIDER_SITE_OTHER): Payer: Medicare HMO | Admitting: Nurse Practitioner

## 2021-10-12 ENCOUNTER — Encounter (INDEPENDENT_AMBULATORY_CARE_PROVIDER_SITE_OTHER): Payer: Self-pay | Admitting: Nurse Practitioner

## 2021-10-12 VITALS — BP 105/61 | HR 78 | Resp 16 | Wt 188.0 lb

## 2021-10-12 DIAGNOSIS — I10 Essential (primary) hypertension: Secondary | ICD-10-CM

## 2021-10-12 DIAGNOSIS — E1129 Type 2 diabetes mellitus with other diabetic kidney complication: Secondary | ICD-10-CM

## 2021-10-12 DIAGNOSIS — N186 End stage renal disease: Secondary | ICD-10-CM

## 2021-10-12 DIAGNOSIS — E782 Mixed hyperlipidemia: Secondary | ICD-10-CM

## 2021-10-12 NOTE — Telephone Encounter (Signed)
Spoke with the patient to schedule him for a left brachial axillary graft with Dr. Lucky Cowboy. Patient was offered 10/29/21 and declined stating he needed a Wednesday. Patient will be scheduled for 11/04/21 for his surgery.

## 2021-10-13 DIAGNOSIS — Z992 Dependence on renal dialysis: Secondary | ICD-10-CM | POA: Diagnosis not present

## 2021-10-13 DIAGNOSIS — N186 End stage renal disease: Secondary | ICD-10-CM | POA: Diagnosis not present

## 2021-10-15 ENCOUNTER — Other Ambulatory Visit: Payer: PRIVATE HEALTH INSURANCE

## 2021-10-15 DIAGNOSIS — Z992 Dependence on renal dialysis: Secondary | ICD-10-CM | POA: Diagnosis not present

## 2021-10-15 DIAGNOSIS — N186 End stage renal disease: Secondary | ICD-10-CM | POA: Diagnosis not present

## 2021-10-16 ENCOUNTER — Other Ambulatory Visit: Payer: Self-pay

## 2021-10-16 DIAGNOSIS — R972 Elevated prostate specific antigen [PSA]: Secondary | ICD-10-CM

## 2021-10-17 DIAGNOSIS — Z992 Dependence on renal dialysis: Secondary | ICD-10-CM | POA: Diagnosis not present

## 2021-10-17 DIAGNOSIS — N186 End stage renal disease: Secondary | ICD-10-CM | POA: Diagnosis not present

## 2021-10-19 ENCOUNTER — Other Ambulatory Visit: Payer: Medicare HMO

## 2021-10-19 DIAGNOSIS — Z992 Dependence on renal dialysis: Secondary | ICD-10-CM | POA: Diagnosis not present

## 2021-10-19 DIAGNOSIS — N186 End stage renal disease: Secondary | ICD-10-CM | POA: Diagnosis not present

## 2021-10-19 DIAGNOSIS — R972 Elevated prostate specific antigen [PSA]: Secondary | ICD-10-CM | POA: Diagnosis not present

## 2021-10-20 ENCOUNTER — Ambulatory Visit: Payer: PRIVATE HEALTH INSURANCE | Admitting: Urology

## 2021-10-20 DIAGNOSIS — Z992 Dependence on renal dialysis: Secondary | ICD-10-CM | POA: Diagnosis not present

## 2021-10-20 DIAGNOSIS — N186 End stage renal disease: Secondary | ICD-10-CM | POA: Diagnosis not present

## 2021-10-20 LAB — PSA: Prostate Specific Ag, Serum: 5.2 ng/mL — ABNORMAL HIGH (ref 0.0–4.0)
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
## 2021-10-21 ENCOUNTER — Ambulatory Visit: Payer: PRIVATE HEALTH INSURANCE | Admitting: Urology

## 2021-10-21 DIAGNOSIS — N138 Other obstructive and reflux uropathy: Secondary | ICD-10-CM

## 2021-10-21 DIAGNOSIS — N529 Male erectile dysfunction, unspecified: Secondary | ICD-10-CM

## 2021-10-21 DIAGNOSIS — R319 Hematuria, unspecified: Secondary | ICD-10-CM

## 2021-10-21 DIAGNOSIS — R972 Elevated prostate specific antigen [PSA]: Secondary | ICD-10-CM

## 2021-10-22 DIAGNOSIS — Z992 Dependence on renal dialysis: Secondary | ICD-10-CM | POA: Diagnosis not present

## 2021-10-22 DIAGNOSIS — N186 End stage renal disease: Secondary | ICD-10-CM | POA: Diagnosis not present

## 2021-10-24 DIAGNOSIS — N186 End stage renal disease: Secondary | ICD-10-CM | POA: Diagnosis not present

## 2021-10-24 DIAGNOSIS — Z992 Dependence on renal dialysis: Secondary | ICD-10-CM | POA: Diagnosis not present

## 2021-10-27 DIAGNOSIS — Z992 Dependence on renal dialysis: Secondary | ICD-10-CM | POA: Diagnosis not present

## 2021-10-27 DIAGNOSIS — N186 End stage renal disease: Secondary | ICD-10-CM | POA: Diagnosis not present

## 2021-10-28 ENCOUNTER — Encounter
Admission: RE | Admit: 2021-10-28 | Discharge: 2021-10-28 | Disposition: A | Discharge status: Home / Self Care | Payer: Medicare HMO | Source: Ambulatory Visit | Attending: Vascular Surgery | Admitting: Vascular Surgery

## 2021-10-28 ENCOUNTER — Other Ambulatory Visit (INDEPENDENT_AMBULATORY_CARE_PROVIDER_SITE_OTHER): Payer: Self-pay | Admitting: Nurse Practitioner

## 2021-10-28 VITALS — Ht 68.0 in | Wt 190.0 lb

## 2021-10-28 DIAGNOSIS — Z01812 Encounter for preprocedural laboratory examination: Secondary | ICD-10-CM

## 2021-10-28 DIAGNOSIS — N186 End stage renal disease: Secondary | ICD-10-CM

## 2021-10-28 NOTE — Patient Instructions (Addendum)
Your procedure is scheduled on: Wednesday November 04, 2021. Report to Day Surgery inside [facility] 2nd floor, stop by admissions desk before getting on elevator. To find out your arrival time please call [phone] between 1PM - 3PM on Tuesday November 03, 2021.  Remember: Instructions that are not followed completely may result in serious medical risk,  up to and including death, or upon the discretion of your surgeon and anesthesiologist your  surgery may need to be rescheduled.     _X__ 1. Do not eat food or drink fluids after midnight the night before your procedure.                 No chewing gum or hard candies.   __X__2.  On the morning of surgery brush your teeth with toothpaste and water, you                may rinse your mouth with mouthwash if you wish.  Do not swallow any toothpaste or mouthwash.     _X__ 3.  No Alcohol for 24 hours before or after surgery.   _X__ 4.  Do Not Smoke or use e-cigarettes For 24 Hours Prior to Your Surgery.                 Do not use any chewable tobacco products for at least 6 hours prior to                 Surgery.  _X__  5.  Do not use any recreational drugs (marijuana, cocaine, heroin, ecstasy, MDMA or other)                For at least one week prior to your surgery.  Combination of these drugs with anesthesia                May have life threatening results.  ____  6.  Bring all medications with you on the day of surgery if instructed.   __X__  7.  Notify your doctor if there is any change in your medical condition      (cold, fever, infections).     Do not wear jewelry, make-up, hairpins, clips or nail polish. Do not wear lotions, powders, perfumes or deodorant. Do not shave 48 hours prior to surgery. Men may shave face and neck. Do not bring valuables to the hospital.    [facility] is not responsible for any belongings or valuables.  Contacts, dentures or bridgework may not be worn into surgery. Leave  your suitcase in the car. After surgery it may be brought to your room. For patients admitted to the hospital, discharge time is determined by your treatment team.   Patients discharged the day of surgery will not be allowed to drive home.   Make arrangements for someone to be with you for the first 24 hours of your Same Day Discharge.   __X__ Take these medicines the morning of surgery with A SIP OF WATER:    1. allopurinol (ZYLOPRIM) 100 MG tablet  2. amLODipine (NORVASC) 10 MG   3. hydrALAZINE (APRESOLINE) 100 MG tablet  4. lamoTRIgine (LAMICTAL) 25 MG tablet  5.   6.  ____ Fleet Enema (as directed)   __X__ Use CHG Soap (or wipes) as directed  ____ Use Benzoyl Peroxide Gel as instructed  ____ Use inhalers on the day of surgery  ____ Stop metformin 2 days prior to surgery    ____ Take 1/2 of usual insulin dose  the night before surgery. No insulin the morning          of surgery.   ____ Call your PCP, cardiologist, or Pulmonologist if taking Coumadin/Plavix/aspirin and ask when to stop before your surgery.   __X__ One Week prior to surgery- Stop Anti-inflammatories such as Ibuprofen, Aleve, Advil, Motrin, meloxicam (MOBIC), diclofenac, etodolac, ketorolac, Toradol, Daypro, piroxicam, Goody's or BC powders. OK TO USE TYLENOL IF NEEDED   __X__ Stop supplements until after surgery.    ____ Bring C-Pap to the hospital.    If you have any questions regarding your pre-procedure instructions,  Please call Pre-admit Testing at [phone]

## 2021-10-29 DIAGNOSIS — N186 End stage renal disease: Secondary | ICD-10-CM | POA: Diagnosis not present

## 2021-10-29 DIAGNOSIS — Z992 Dependence on renal dialysis: Secondary | ICD-10-CM | POA: Diagnosis not present

## 2021-10-31 DIAGNOSIS — N186 End stage renal disease: Secondary | ICD-10-CM | POA: Diagnosis not present

## 2021-10-31 DIAGNOSIS — Z992 Dependence on renal dialysis: Secondary | ICD-10-CM | POA: Diagnosis not present

## 2021-11-01 ENCOUNTER — Encounter (INDEPENDENT_AMBULATORY_CARE_PROVIDER_SITE_OTHER): Payer: Self-pay | Admitting: Nurse Practitioner

## 2021-11-01 DIAGNOSIS — G4733 Obstructive sleep apnea (adult) (pediatric): Secondary | ICD-10-CM | POA: Diagnosis not present
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
## 2021-11-03 DIAGNOSIS — N186 End stage renal disease: Secondary | ICD-10-CM | POA: Diagnosis not present

## 2021-11-03 DIAGNOSIS — Z992 Dependence on renal dialysis: Secondary | ICD-10-CM | POA: Diagnosis not present

## 2021-11-04 MED ORDER — CHLORHEXIDINE GLUCONATE CLOTH 2 % EX PADS: 6.0000 | MEDICATED_PAD | Freq: Once | CUTANEOUS | Status: DC

## 2021-11-04 MED ORDER — CHLORHEXIDINE GLUCONATE 0.12 % MT SOLN: 15.0000 mL | Freq: Once | OROMUCOSAL | Status: AC

## 2021-11-04 MED ORDER — ORAL CARE MOUTH RINSE: 15.0000 mL | Freq: Once | OROMUCOSAL | Status: AC

## 2021-11-04 MED ORDER — SODIUM CHLORIDE 0.9 % IV SOLN: INTRAVENOUS | Status: DC

## 2021-11-04 MED ORDER — FAMOTIDINE 20 MG PO TABS: 20.0000 mg | ORAL_TABLET | Freq: Once | ORAL | Status: AC

## 2021-11-04 MED ORDER — CEFAZOLIN SODIUM-DEXTROSE 2-4 GM/100ML-% IV SOLN
2.0000 g | INTRAVENOUS | Status: AC
  Administered 2021-11-05: 2 g via INTRAVENOUS

## 2021-11-05 ENCOUNTER — Ambulatory Visit
Admission: RE | Admit: 2021-11-05 | Discharge: 2021-11-05 | Disposition: A | Discharge status: Home / Self Care | Payer: Medicare HMO | Source: Ambulatory Visit | Attending: Vascular Surgery | Admitting: Vascular Surgery

## 2021-11-05 ENCOUNTER — Other Ambulatory Visit: Payer: Self-pay

## 2021-11-05 ENCOUNTER — Encounter
Admission: RE | Disposition: A | Discharge status: Home / Self Care | Payer: Self-pay | Source: Ambulatory Visit | Attending: Vascular Surgery

## 2021-11-05 ENCOUNTER — Ambulatory Visit: Payer: Medicare HMO | Admitting: Anesthesiology

## 2021-11-05 ENCOUNTER — Encounter: Payer: Self-pay | Admitting: Vascular Surgery

## 2021-11-05 DIAGNOSIS — Z7984 Long term (current) use of oral hypoglycemic drugs: Secondary | ICD-10-CM | POA: Diagnosis not present

## 2021-11-05 DIAGNOSIS — I120 Hypertensive chronic kidney disease with stage 5 chronic kidney disease or end stage renal disease: Secondary | ICD-10-CM | POA: Insufficient documentation

## 2021-11-05 DIAGNOSIS — G4730 Sleep apnea, unspecified: Secondary | ICD-10-CM | POA: Insufficient documentation

## 2021-11-05 DIAGNOSIS — T82590A Other mechanical complication of surgically created arteriovenous fistula, initial encounter: Secondary | ICD-10-CM | POA: Diagnosis not present

## 2021-11-05 DIAGNOSIS — N186 End stage renal disease: Secondary | ICD-10-CM | POA: Insufficient documentation

## 2021-11-05 DIAGNOSIS — Z87891 Personal history of nicotine dependence: Secondary | ICD-10-CM | POA: Diagnosis not present

## 2021-11-05 DIAGNOSIS — E782 Mixed hyperlipidemia: Secondary | ICD-10-CM | POA: Insufficient documentation

## 2021-11-05 DIAGNOSIS — E1122 Type 2 diabetes mellitus with diabetic chronic kidney disease: Secondary | ICD-10-CM | POA: Insufficient documentation

## 2021-11-05 DIAGNOSIS — R69 Illness, unspecified: Secondary | ICD-10-CM | POA: Diagnosis not present

## 2021-11-05 DIAGNOSIS — Z992 Dependence on renal dialysis: Secondary | ICD-10-CM

## 2021-11-05 DIAGNOSIS — Z01812 Encounter for preprocedural laboratory examination: Secondary | ICD-10-CM

## 2021-11-05 DIAGNOSIS — Z01818 Encounter for other preprocedural examination: Secondary | ICD-10-CM | POA: Diagnosis not present

## 2021-11-05 HISTORY — PX: AV FISTULA PLACEMENT: SHX1204

## 2021-11-05 LAB — TYPE AND SCREEN
ABO/RH(D): A POS
Antibody Screen: NEGATIVE

## 2021-11-05 LAB — POCT I-STAT, CHEM 8
BUN: 39 mg/dL — ABNORMAL HIGH (ref 8–23)
Calcium, Ion: 1.17 mmol/L (ref 1.15–1.40)
Chloride: 105 mmol/L (ref 98–111)
Creatinine, Ser: 6.1 mg/dL — ABNORMAL HIGH (ref 0.61–1.24)
Glucose, Bld: 96 mg/dL (ref 70–99)
HCT: 34 % — ABNORMAL LOW (ref 39.0–52.0)
Hemoglobin: 11.6 g/dL — ABNORMAL LOW (ref 13.0–17.0)
Potassium: 3.4 mmol/L — ABNORMAL LOW (ref 3.5–5.1)
Sodium: 144 mmol/L (ref 135–145)
TCO2: 27 mmol/L (ref 22–32)

## 2021-11-05 SURGERY — INSERTION OF ARTERIOVENOUS (AV) GORE-TEX GRAFT ARM
Anesthesia: General | Site: Arm Upper | Laterality: Left

## 2021-11-05 MED ORDER — LIDOCAINE HCL (PF) 2 % IJ SOLN
INTRAMUSCULAR | Status: AC
  Filled 2021-11-05: qty 5

## 2021-11-05 MED ORDER — "VISTASEAL 4 ML SINGLE DOSE KIT "
PACK | CUTANEOUS | Status: DC | PRN
  Administered 2021-11-05: 4 mL via TOPICAL

## 2021-11-05 MED ORDER — ONDANSETRON HCL 4 MG/2ML IJ SOLN
INTRAMUSCULAR | Status: DC | PRN
  Administered 2021-11-05: 4 mg via INTRAVENOUS

## 2021-11-05 MED ORDER — FAMOTIDINE 20 MG PO TABS
ORAL_TABLET | ORAL | Status: AC
  Administered 2021-11-05: 20 mg via ORAL
  Filled 2021-11-05: qty 1

## 2021-11-05 MED ORDER — BUPIVACAINE-EPINEPHRINE (PF) 0.5% -1:200000 IJ SOLN
INTRAMUSCULAR | Status: AC
  Filled 2021-11-05: qty 30

## 2021-11-05 MED ORDER — CEFAZOLIN SODIUM-DEXTROSE 2-4 GM/100ML-% IV SOLN
INTRAVENOUS | Status: AC
  Filled 2021-11-05: qty 100

## 2021-11-05 MED ORDER — FENTANYL CITRATE (PF) 100 MCG/2ML IJ SOLN: 25.0000 ug | INTRAMUSCULAR | Status: DC | PRN

## 2021-11-05 MED ORDER — ONDANSETRON HCL 4 MG/2ML IJ SOLN
INTRAMUSCULAR | Status: AC
  Filled 2021-11-05: qty 2

## 2021-11-05 MED ORDER — HYDROCODONE-ACETAMINOPHEN 5-325 MG PO TABS: 2.0000 | ORAL_TABLET | Freq: Four times a day (QID) | ORAL | 0 refills | Status: DC | PRN

## 2021-11-05 MED ORDER — PHENYLEPHRINE HCL-NACL 20-0.9 MG/250ML-% IV SOLN
INTRAVENOUS | Status: DC | PRN
  Administered 2021-11-05: 40 ug/min via INTRAVENOUS

## 2021-11-05 MED ORDER — EPHEDRINE SULFATE (PRESSORS) 50 MG/ML IJ SOLN
INTRAMUSCULAR | Status: DC | PRN
  Administered 2021-11-05 (×2): 10 mg via INTRAVENOUS

## 2021-11-05 MED ORDER — OXYCODONE HCL 5 MG PO TABS: 5.0000 mg | ORAL_TABLET | Freq: Once | ORAL | Status: DC | PRN

## 2021-11-05 MED ORDER — HEMOSTATIC AGENTS (NO CHARGE) OPTIME
TOPICAL | Status: DC | PRN
  Administered 2021-11-05: 1 via TOPICAL

## 2021-11-05 MED ORDER — GLYCOPYRROLATE 0.2 MG/ML IJ SOLN
INTRAMUSCULAR | Status: DC | PRN
  Administered 2021-11-05: .2 mg via INTRAVENOUS

## 2021-11-05 MED ORDER — SODIUM CHLORIDE 0.9 % IV SOLN: INTRAVENOUS | Status: DC | PRN

## 2021-11-05 MED ORDER — PROPOFOL 10 MG/ML IV BOLUS
INTRAVENOUS | Status: AC
  Filled 2021-11-05: qty 20

## 2021-11-05 MED ORDER — FENTANYL CITRATE (PF) 100 MCG/2ML IJ SOLN
INTRAMUSCULAR | Status: AC
  Filled 2021-11-05: qty 2

## 2021-11-05 MED ORDER — SUCCINYLCHOLINE CHLORIDE 200 MG/10ML IV SOSY
PREFILLED_SYRINGE | INTRAVENOUS | Status: DC | PRN
  Administered 2021-11-05: 160 mg via INTRAVENOUS

## 2021-11-05 MED ORDER — OXYCODONE HCL 5 MG/5ML PO SOLN: 5.0000 mg | Freq: Once | ORAL | Status: DC | PRN

## 2021-11-05 MED ORDER — ACETAMINOPHEN 10 MG/ML IV SOLN
INTRAVENOUS | Status: DC | PRN
  Administered 2021-11-05: 1000 mg via INTRAVENOUS

## 2021-11-05 MED ORDER — FENTANYL CITRATE (PF) 100 MCG/2ML IJ SOLN
INTRAMUSCULAR | Status: DC | PRN
  Administered 2021-11-05: 50 ug via INTRAVENOUS

## 2021-11-05 MED ORDER — SEVOFLURANE IN SOLN
RESPIRATORY_TRACT | Status: AC
  Filled 2021-11-05: qty 250

## 2021-11-05 MED ORDER — SUCCINYLCHOLINE CHLORIDE 200 MG/10ML IV SOSY
PREFILLED_SYRINGE | INTRAVENOUS | Status: AC
  Filled 2021-11-05: qty 10

## 2021-11-05 MED ORDER — LIDOCAINE HCL (CARDIAC) PF 100 MG/5ML IV SOSY
PREFILLED_SYRINGE | INTRAVENOUS | Status: DC | PRN
  Administered 2021-11-05: 100 mg via INTRAVENOUS

## 2021-11-05 MED ORDER — HEPARIN SODIUM (PORCINE) 1000 UNIT/ML IJ SOLN
INTRAMUSCULAR | Status: DC | PRN
  Administered 2021-11-05: 3000 [IU] via INTRAVENOUS

## 2021-11-05 MED ORDER — PROPOFOL 10 MG/ML IV BOLUS
INTRAVENOUS | Status: DC | PRN
  Administered 2021-11-05: 120 mg via INTRAVENOUS

## 2021-11-05 MED ORDER — ACETAMINOPHEN 10 MG/ML IV SOLN
INTRAVENOUS | Status: AC
  Filled 2021-11-05: qty 100

## 2021-11-05 MED ORDER — CHLORHEXIDINE GLUCONATE 0.12 % MT SOLN
OROMUCOSAL | Status: AC
  Administered 2021-11-05: 15 mL via OROMUCOSAL
  Filled 2021-11-05: qty 15

## 2021-11-05 MED ORDER — HEPARIN SODIUM (PORCINE) 5000 UNIT/ML IJ SOLN
INTRAMUSCULAR | Status: AC
  Filled 2021-11-05: qty 1

## 2021-11-05 SURGICAL SUPPLY — 59 items
BAG DECANTER FOR FLEXI CONT (MISCELLANEOUS) ×1 IMPLANT
BLADE SURG SZ11 CARB STEEL (BLADE) ×1 IMPLANT
BNDG COHESIVE 4X5 TAN STRL LF (GAUZE/BANDAGES/DRESSINGS) IMPLANT
BOOT SUTURE AID YELLOW STND (SUTURE) ×1 IMPLANT
BRUSH SCRUB EZ  4% CHG (MISCELLANEOUS) ×1
BRUSH SCRUB EZ 4% CHG (MISCELLANEOUS) ×1 IMPLANT
CHLORAPREP W/TINT 26 (MISCELLANEOUS) ×1 IMPLANT
CLIP SPRNG 6 S-JAW DBL (CLIP) ×1 IMPLANT
CLIP SPRNG 6MM S-JAW DBL (CLIP) ×1
DERMABOND ADVANCED (GAUZE/BANDAGES/DRESSINGS) ×1
DERMABOND ADVANCED .7 DNX12 (GAUZE/BANDAGES/DRESSINGS) ×1 IMPLANT
ELECT CAUTERY BLADE 6.4 (BLADE) ×1 IMPLANT
ELECT REM PT RETURN 9FT ADLT (ELECTROSURGICAL) ×1
ELECTRODE REM PT RTRN 9FT ADLT (ELECTROSURGICAL) ×1 IMPLANT
GLOVE BIO SURGEON STRL SZ7 (GLOVE) ×1 IMPLANT
GOWN STRL REUS W/ TWL LRG LVL3 (GOWN DISPOSABLE) ×1 IMPLANT
GOWN STRL REUS W/ TWL XL LVL3 (GOWN DISPOSABLE) ×1 IMPLANT
GOWN STRL REUS W/TWL LRG LVL3 (GOWN DISPOSABLE) ×1
GOWN STRL REUS W/TWL XL LVL3 (GOWN DISPOSABLE) ×1
GRAFT PROPATEN STD WALL 6X40 (Vascular Products) IMPLANT
HEMOSTAT SURGICEL 2X3 (HEMOSTASIS) ×1 IMPLANT
IV NS 500ML (IV SOLUTION) ×1
IV NS 500ML BAXH (IV SOLUTION) ×1 IMPLANT
KIT TURNOVER KIT A (KITS) ×1 IMPLANT
LABEL OR SOLS (LABEL) ×1 IMPLANT
LOOP RED MAXI  1X406MM (MISCELLANEOUS) ×1
LOOP VESSEL MAXI  1X406 RED (MISCELLANEOUS) ×1
LOOP VESSEL MAXI 1X406 RED (MISCELLANEOUS) ×1 IMPLANT
LOOP VESSEL MINI 0.8X406 BLUE (MISCELLANEOUS) ×1 IMPLANT
LOOPS BLUE MINI 0.8X406MM (MISCELLANEOUS) ×1
MANIFOLD NEPTUNE II (INSTRUMENTS) ×1 IMPLANT
NDL FILTER BLUNT 18X1 1/2 (NEEDLE) ×1 IMPLANT
NEEDLE FILTER BLUNT 18X 1/2SAF (NEEDLE) ×1
NEEDLE FILTER BLUNT 18X1 1/2 (NEEDLE) ×1 IMPLANT
NS IRRIG 500ML POUR BTL (IV SOLUTION) ×1 IMPLANT
PACK EXTREMITY ARMC (MISCELLANEOUS) ×1 IMPLANT
PAD PREP 24X41 OB/GYN DISP (PERSONAL CARE ITEMS) ×1 IMPLANT
SOLUTION CELL SAVER (CLIP) ×1 IMPLANT
SPIKE FLUID TRANSFER (MISCELLANEOUS) ×1 IMPLANT
STOCKINETTE 48X4 2 PLY STRL (GAUZE/BANDAGES/DRESSINGS) ×1 IMPLANT
STOCKINETTE IMPERV 14X48 (MISCELLANEOUS) IMPLANT
STOCKINETTE STRL 4IN 9604848 (GAUZE/BANDAGES/DRESSINGS) ×1 IMPLANT
SUT GORETEX 6.0 TT9 (SUTURE) ×1 IMPLANT
SUT MNCRL AB 4-0 PS2 18 (SUTURE) ×1 IMPLANT
SUT PROLENE 6 0 BV (SUTURE) ×4 IMPLANT
SUT SILK 0 SH 30 (SUTURE) ×1 IMPLANT
SUT SILK 2 0 (SUTURE) ×1
SUT SILK 2-0 18XBRD TIE 12 (SUTURE) ×1 IMPLANT
SUT SILK 3 0 (SUTURE) ×1
SUT SILK 3-0 18XBRD TIE 12 (SUTURE) ×1 IMPLANT
SUT SILK 4 0 (SUTURE) ×1
SUT SILK 4-0 18XBRD TIE 12 (SUTURE) ×1 IMPLANT
SUT VIC AB 3-0 SH 27 (SUTURE) ×1
SUT VIC AB 3-0 SH 27X BRD (SUTURE) ×1 IMPLANT
SYR 20ML LL LF (SYRINGE) ×1 IMPLANT
SYR 3ML LL SCALE MARK (SYRINGE) ×1 IMPLANT
TRAP FLUID SMOKE EVACUATOR (MISCELLANEOUS) ×1 IMPLANT
WATER STERILE IRR 1000ML POUR (IV SOLUTION) IMPLANT
WATER STERILE IRR 500ML POUR (IV SOLUTION) ×1 IMPLANT

## 2021-11-05 NOTE — Anesthesia Procedure Notes (Signed)
Procedure Name: Intubation Date/Time: 11/05/2021 2:42 PM  Performed by: Debe Coder, CRNAPre-anesthesia Checklist: Patient identified, Emergency Drugs available, Suction available and Patient being monitored Patient Re-evaluated:Patient Re-evaluated prior to induction Oxygen Delivery Method: Circle system utilized Preoxygenation: Pre-oxygenation with 100% oxygen Induction Type: IV induction Ventilation: Mask ventilation without difficulty Laryngoscope Size: McGraph and 4 Grade View: Grade I Tube type: Oral Tube size: 7.0 mm Number of attempts: 1 Airway Equipment and Method: Stylet and Oral airway Placement Confirmation: ETT inserted through vocal cords under direct vision, positive ETCO2 and breath sounds checked- equal and bilateral Secured at: 21 cm Tube secured with: Tape Dental Injury: Teeth and Oropharynx as per pre-operative assessment

## 2021-11-05 NOTE — Interval H&P Note (Signed)
History and Physical Interval Note:  11/05/2021 1:51 PM  Luis Miller.  has presented today for surgery, with the diagnosis of ESRD.  The various methods of treatment have been discussed with the patient and family. After consideration of risks, benefits and other options for treatment, the patient has consented to  Procedure(s): INSERTION OF ARTERIOVENOUS (AV) GORE-TEX GRAFT ARM ( BRACHIAL AXILLARY) (Left) as a surgical intervention.  The patient's history has been reviewed, patient examined, no change in status, stable for surgery.  I have reviewed the patient's chart and labs.  Questions were answered to the patient's satisfaction.     Leotis Pain

## 2021-11-05 NOTE — Transfer of Care (Signed)
Immediate Anesthesia Transfer of Care Note  Patient: Luis Miller.  Procedure(s) Performed: INSERTION OF ARTERIOVENOUS (AV) GORE-TEX GRAFT ARM ( BRACHIAL AXILLARY) (Left: Arm Upper)  Patient Location: PACU  Anesthesia Type:General  Level of Consciousness: awake, alert , oriented and patient cooperative  Airway & Oxygen Therapy: Patient Spontanous Breathing  Post-op Assessment: Report given to RN and Post -op Vital signs reviewed and stable  Post vital signs: Reviewed and stable  Last Vitals:  Vitals Value Taken Time  BP 152/72 11/05/21 1620  Temp    Pulse 76 11/05/21 1624  Resp 15 11/05/21 1624  SpO2 100 % 11/05/21 1624  Vitals shown include unvalidated device data.  Last Pain:  Vitals:   11/05/21 1201  TempSrc: Temporal  PainSc: 0-No pain         Complications: No notable events documented.

## 2021-11-05 NOTE — Anesthesia Postprocedure Evaluation (Signed)
Anesthesia Post Note  Patient: Luis Miller.  Procedure(s) Performed: INSERTION OF ARTERIOVENOUS (AV) GORE-TEX GRAFT ARM ( BRACHIAL AXILLARY) (Left: Arm Upper)  Patient location during evaluation: PACU Anesthesia Type: General Level of consciousness: awake and alert Pain management: pain level controlled Vital Signs Assessment: post-procedure vital signs reviewed and stable Respiratory status: spontaneous breathing, nonlabored ventilation, respiratory function stable and patient connected to nasal cannula oxygen Cardiovascular status: blood pressure returned to baseline and stable Postop Assessment: no apparent nausea or vomiting Anesthetic complications: no   No notable events documented.   Last Vitals:  Vitals:   11/05/21 1645 11/05/21 1658  BP: (!) 150/79 133/68  Pulse: 72 67  Resp: 15 14  Temp:    SpO2: 100% 97%    Last Pain:  Vitals:   11/05/21 1658  TempSrc:   PainSc: 0-No pain                 Precious Haws Kinza Gouveia

## 2021-11-05 NOTE — Op Note (Signed)
[facility]  OPERATIVE NOTE   PROCEDURE:  Left upper arm brachial artery to axillary vein arteriovenous graft  PRE-OPERATIVE DIAGNOSIS: 1. end stage renal disease  2. Failed AVF left arm  POST-OPERATIVE DIAGNOSIS: same  SURGEON: Leotis Pain  ASSISTANT(S): none  ANESTHESIA: general  ESTIMATED BLOOD LOSS: 25 cc  FINDING(S): none  SPECIMEN(S):  None  INDICATIONS:   Luis Miller. is a 76 y.o. male who presents with end stage renal disease and need for a permanent access with failures of previous AVF.  Risk, benefits, and alternatives to access surgery were discussed.  The patient is aware the risks include but are not limited to: bleeding, infection, steal syndrome, nerve damage, ischemic monomelic neuropathy, failure to mature, and need for additional procedures.  The patient is aware of the risks and elects to proceed forward.  DESCRIPTION: After full informed written consent was obtained from the patient, the patient was brought back to the operating room and placed supine upon the operating table.  The patient was given IV antibiotics prior to proceeding.  After obtaining adequate sedation, the patient was prepped and draped in standard fashion for a left arm access procedure.  I turned my attention first to the antecubitum.   I made an incision over the brachial artery, and dissected down through the subcutaneous tissue to the fascia carefully and was able to dissect out the brachial artery.  The artery was about patent and of adequate size to support a graft.  It was controlled proximally and distally with vessel loops and then I turned my attention to the high bicipital groove in the axilla.  I made an incision and dissected down through the subcutaneous tissue and fascia until I reached the axillary vein.  It was patent and adequate size for graft creation.  I then dissected this vein proximally and distal and prepared it for control with Bulldog clamps.   I took a Dietitian and dissected from the antecubital up to the axillary incision.  Then I delivered the 6 mm Propaten PTFE graft, through this metal tunneler and then pulled out the metal tunneler leaving the graft in place and making sure the line was up for orientation.  I then gave the patient 3000 units of heparin to gain some anticoagulation.  After waiting 3 minutes, I placed the brachial artery under tension proximally and distally with vessel loops, made an arteriotomy and extended it with a Potts scissor.  I sewed the graft to this arteriotomy with a running stitch of CV-6 suture.  At this point, then I completed the anastomosis in the usual fashion.  I released the vessel loops on the inflow and allowed the artery to decompress through the graft. There was good pulsatile bleeding through this graft.  I clamped the graft near its arterial anastomosis and sucked out all the blood in the graft and loaded the graft with heparinized saline.  At this point, I pulled the graft to appropriate length and reset my exposure of the axillary vein.  Then, I controlled the vein with Bulldog clamps.  An anterior wall venotomy was created with an 11 blade and extended with Potts scissors.  I then spatulated the graft to facilitate an end-to- side anastomosis matching the arteriotomy.  In the process of spatulating, I cut the graft to appropriate length for this anastomosis.  This graft was sewn to the vein in an end-to-side configuration with a running CV-6 suture.  Prior to completing this  anastomosis, I allowed the vein to back bleed and then I also allowed the artery to bleed in an antegrade fashion.  I completed this anastomosis in the usual fashion, and then irrigated out the high bicipital exposure and then placed Surgicel and Evicel.  I then turned my attention back to the antecubitum.  There was pulsatile flow in the artery beyond the graft.   At this point, I washed out the antecubital incision.  Surgicel and Evicel were then placed. There was no more active bleeding.  The subcutaneous tissue was reapproximated with a running stitch of 3-0 Vicryl.  The skin was then reapproximated with a running subcuticular 4-0 Monocryl.  The skin was then cleaned, dried, and Dermabond used to reinforce the skin closure.  We then turned our attention to the axillary incision.  The subcutaneous tissue was repaired with running stitch of 3-0 Vicryl.  The skin was then reapproximated with running subcuticular 4-0 Monocryl.  The skin was then cleaned, dried, and then the skin closure was reinforced with Dermabond.  The patient was then awakened from anesthesia and taken to the recovery room in stable condition having tolerated the procedure well.    COMPLICATIONS: None  CONDITION: Stable   Leotis Pain 11/05/2021 4:17 PM   This note was created with Dragon Medical transcription system. Any errors in dictation are purely unintentional.

## 2021-11-05 NOTE — Discharge Instructions (Signed)
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
## 2021-11-06 ENCOUNTER — Encounter: Payer: Self-pay | Admitting: Vascular Surgery

## 2021-11-06 ENCOUNTER — Telehealth (INDEPENDENT_AMBULATORY_CARE_PROVIDER_SITE_OTHER): Payer: Self-pay

## 2021-11-06 DIAGNOSIS — N186 End stage renal disease: Secondary | ICD-10-CM | POA: Diagnosis not present

## 2021-11-06 DIAGNOSIS — Z992 Dependence on renal dialysis: Secondary | ICD-10-CM | POA: Diagnosis not present

## 2021-11-06 NOTE — Telephone Encounter (Signed)
Patient was made aware with medical recommendations

## 2021-11-06 NOTE — Telephone Encounter (Signed)
Yes.  He was intubated, so he had a tube down his throat.  A sore throat is normal.  He should gargle with salt water and use throat lozenges.  It will pass

## 2021-11-07 DIAGNOSIS — N186 End stage renal disease: Secondary | ICD-10-CM | POA: Diagnosis not present

## 2021-11-07 DIAGNOSIS — Z992 Dependence on renal dialysis: Secondary | ICD-10-CM | POA: Diagnosis not present

## 2021-11-09 ENCOUNTER — Other Ambulatory Visit: Payer: Self-pay | Admitting: Nurse Practitioner

## 2021-11-09 DIAGNOSIS — E782 Mixed hyperlipidemia: Secondary | ICD-10-CM

## 2021-11-09 NOTE — Telephone Encounter (Signed)
Copied from [place] [id number]. Topic: Quick Communication - See Telephone Encounter >> Nov 09, 2021  8:48 AM Carroll Kinds wrote: CRM for notification. See Telephone encounter for: 11/09/21.   Medication Refill - Medication: rosuvastatin   Has the patient contacted their pharmacy? No. (Agent: If no, request that the patient contact the pharmacy for the refill. If patient does not wish to contact the pharmacy document the reason why and proceed with request.) (Agent: If yes, when and what did the pharmacy advise?)   Preferred Pharmacy (with phone number or street name): [facility] [place], [place] to [facility] (Ph: [phone]) Has the patient been seen for an appointment in the last year OR does the patient have an upcoming appointment? Yes.     Agent: Please be advised that RX refills may take up to 3 business days. We ask that you follow-up with your pharmacy.     Mail order request. Has a week's worth left.

## 2021-11-09 NOTE — Telephone Encounter (Signed)
Copied from [place] [id number]. Topic: Quick Communication - See Telephone Encounter >> Nov 09, 2021  8:48 AM Carroll Kinds wrote: CRM for notification. See Telephone encounter for: 11/09/21.  Medication Refill - Medication: rosuvastatin  Has the patient contacted their pharmacy? No. (Agent: If no, request that the patient contact the pharmacy for the refill. If patient does not wish to contact the pharmacy document the reason why and proceed with request.) (Agent: If yes, when and what did the pharmacy advise?)  Preferred Pharmacy (with phone number or street name): [facility] [place], [place] to [facility] [facility] (Ph: [phone]) Has the patient been seen for an appointment in the last year OR does the patient have an upcoming appointment? Yes.    Agent: Please be advised that RX refills may take up to 3 business days. We ask that you follow-up with your pharmacy.   Mail order request. Has a week's worth left.
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
## 2021-11-09 NOTE — Telephone Encounter (Signed)
Updated TE to new chart note. Please disregard.

## 2021-11-10 DIAGNOSIS — N186 End stage renal disease: Secondary | ICD-10-CM | POA: Diagnosis not present

## 2021-11-10 DIAGNOSIS — Z992 Dependence on renal dialysis: Secondary | ICD-10-CM | POA: Diagnosis not present

## 2021-11-10 MED ORDER — ROSUVASTATIN CALCIUM 10 MG PO TABS: 10.0000 mg | ORAL_TABLET | Freq: Every day | ORAL | 1 refills | Status: DC

## 2021-11-10 NOTE — Telephone Encounter (Signed)
Requested Prescriptions  Pending Prescriptions Disp Refills  . rosuvastatin (CRESTOR) 10 MG tablet 90 tablet 1    Sig: Take 1 tablet (10 mg total) by mouth daily.     Cardiovascular:  Antilipid - Statins 2 Failed - 11/09/2021 11:15 AM      Failed - Cr in normal range and within 360 days    Creatinine  Date Value Ref Range Status  03/08/2014 2.22 (H) 0.60 - 1.30 mg/dL Final   Creatinine, Ser  Date Value Ref Range Status  11/05/2021 6.10 (H) 0.61 - 1.24 mg/dL Final   Creatinine, Urine  Date Value Ref Range Status  08/01/2019 194 mg/dL Final    Comment:    Performed at [facility], [address] [place], [place] [address]         Failed - Lipid Panel in normal range within the last 12 months    Cholesterol, Total  Date Value Ref Range Status  11/17/2020 145 100 - 199 mg/dL Final   Cholesterol Piccolo, Waived  Date Value Ref Range Status  10/03/2017 180 <200 mg/dL Final    Comment:                            Desirable                <200                         Borderline High      200- 239                         High                     >239    LDL Chol Calc (NIH)  Date Value Ref Range Status  11/17/2020 84 0 - 99 mg/dL Final   HDL  Date Value Ref Range Status  11/17/2020 38 (L) >39 mg/dL Final   Triglycerides  Date Value Ref Range Status  11/17/2020 131 0 - 149 mg/dL Final   Triglycerides Piccolo,Waived  Date Value Ref Range Status  10/03/2017 252 (H) <150 mg/dL Final    Comment:                            Normal                   <150                         Borderline High     150 - 199                         High                200 - 499                         Very High                >499          Passed - Patient is not pregnant      Passed - Valid encounter within last 12 months    Recent Outpatient Visits          1 month  ago Cellulitis of left lower leg   [facility] [place], [place] T, NP   2 months ago Cellulitis  of left lower leg   [facility] [place], [place] T, NP   2 months ago Essential hypertension   [facility] Jon Billings, NP   3 months ago Nasal congestion   [facility] Jon Billings, NP   5 months ago Encounter to discuss test results   [facility] Jon Billings, NP      Future Appointments            Tomorrow Ernestine Conrad, [facility] [place]   In 2 weeks Jon Billings, NP [facility], [place]   In 3 weeks  [facility], [place]   In 3 months Jon Billings, NP [facility], [place]

## 2021-11-11 ENCOUNTER — Encounter: Payer: Self-pay | Admitting: Urology

## 2021-11-11 ENCOUNTER — Ambulatory Visit (INDEPENDENT_AMBULATORY_CARE_PROVIDER_SITE_OTHER): Payer: Medicare HMO | Admitting: Urology

## 2021-11-11 VITALS — BP 129/73 | HR 74 | Ht 66.0 in | Wt 186.0 lb

## 2021-11-11 DIAGNOSIS — R972 Elevated prostate specific antigen [PSA]: Secondary | ICD-10-CM

## 2021-11-11 DIAGNOSIS — N529 Male erectile dysfunction, unspecified: Secondary | ICD-10-CM

## 2021-11-11 DIAGNOSIS — R319 Hematuria, unspecified: Secondary | ICD-10-CM

## 2021-11-11 DIAGNOSIS — N138 Other obstructive and reflux uropathy: Secondary | ICD-10-CM | POA: Diagnosis not present

## 2021-11-11 DIAGNOSIS — N401 Enlarged prostate with lower urinary tract symptoms: Secondary | ICD-10-CM

## 2021-11-12 DIAGNOSIS — Z992 Dependence on renal dialysis: Secondary | ICD-10-CM | POA: Diagnosis not present

## 2021-11-12 DIAGNOSIS — N186 End stage renal disease: Secondary | ICD-10-CM | POA: Diagnosis not present

## 2021-11-13 ENCOUNTER — Telehealth (INDEPENDENT_AMBULATORY_CARE_PROVIDER_SITE_OTHER): Payer: Self-pay | Admitting: Vascular Surgery

## 2021-11-13 ENCOUNTER — Ambulatory Visit (INDEPENDENT_AMBULATORY_CARE_PROVIDER_SITE_OTHER): Payer: Medicare HMO

## 2021-11-13 ENCOUNTER — Other Ambulatory Visit (INDEPENDENT_AMBULATORY_CARE_PROVIDER_SITE_OTHER): Payer: Self-pay | Admitting: Vascular Surgery

## 2021-11-13 DIAGNOSIS — Z95828 Presence of other vascular implants and grafts: Secondary | ICD-10-CM

## 2021-11-13 DIAGNOSIS — N186 End stage renal disease: Secondary | ICD-10-CM | POA: Diagnosis not present

## 2021-11-13 NOTE — Telephone Encounter (Signed)
Patient had [place] this morning and then is to follow up with his appt on 11/25/21. Pt called and wanted to know why Dr. Lucky Cowboy had not called him with the results of the [place] yet. I advised that Dr. Lucky Cowboy had just gotten in clinic from the emergency surgeryu and was now seeing patients. In turn, I reached out to [name] about results from [place]. [name] states via Teams message :  "On Luis Miller, Dr. Lucky Cowboy has reviewed findings from [place] and based on that, he said that flow looks good in dialysis access, nothing needs any immediate attention based on [place], that it not uncommon to have the swelling since surgery was so recent and FB will see him on 9/6."  As I am not allowed to give results (not clinical), can someone please reach out to patient and let him know the results from [place] above.

## 2021-11-13 NOTE — Telephone Encounter (Signed)
Patient made aware with medical recommendations and verbalized understanding

## 2021-11-13 NOTE — Telephone Encounter (Signed)
Return call to patient I was not able to leave message due to voicemail being full

## 2021-11-14 DIAGNOSIS — N186 End stage renal disease: Secondary | ICD-10-CM | POA: Diagnosis not present

## 2021-11-14 DIAGNOSIS — Z992 Dependence on renal dialysis: Secondary | ICD-10-CM | POA: Diagnosis not present

## 2021-11-17 DIAGNOSIS — N186 End stage renal disease: Secondary | ICD-10-CM | POA: Diagnosis not present

## 2021-11-17 DIAGNOSIS — Z992 Dependence on renal dialysis: Secondary | ICD-10-CM | POA: Diagnosis not present

## 2021-11-19 DIAGNOSIS — Z992 Dependence on renal dialysis: Secondary | ICD-10-CM | POA: Diagnosis not present

## 2021-11-19 DIAGNOSIS — N186 End stage renal disease: Secondary | ICD-10-CM | POA: Diagnosis not present

## 2021-11-21 DIAGNOSIS — Z992 Dependence on renal dialysis: Secondary | ICD-10-CM | POA: Diagnosis not present

## 2021-11-21 DIAGNOSIS — N186 End stage renal disease: Secondary | ICD-10-CM | POA: Diagnosis not present

## 2021-11-24 ENCOUNTER — Ambulatory Visit: Payer: Medicare HMO | Admitting: Nurse Practitioner

## 2021-11-24 DIAGNOSIS — Z992 Dependence on renal dialysis: Secondary | ICD-10-CM | POA: Diagnosis not present

## 2021-11-24 DIAGNOSIS — N186 End stage renal disease: Secondary | ICD-10-CM | POA: Diagnosis not present

## 2021-11-25 ENCOUNTER — Encounter (INDEPENDENT_AMBULATORY_CARE_PROVIDER_SITE_OTHER): Payer: Medicare HMO

## 2021-11-25 ENCOUNTER — Ambulatory Visit (INDEPENDENT_AMBULATORY_CARE_PROVIDER_SITE_OTHER): Payer: Medicare HMO | Admitting: Nurse Practitioner

## 2021-11-25 ENCOUNTER — Encounter (INDEPENDENT_AMBULATORY_CARE_PROVIDER_SITE_OTHER): Payer: Self-pay | Admitting: Nurse Practitioner

## 2021-11-25 ENCOUNTER — Ambulatory Visit (INDEPENDENT_AMBULATORY_CARE_PROVIDER_SITE_OTHER): Payer: Medicare HMO | Admitting: Psychiatry

## 2021-11-25 ENCOUNTER — Encounter: Payer: Self-pay | Admitting: Psychiatry

## 2021-11-25 VITALS — BP 124/69 | HR 84 | Temp 98.5°F | Wt 184.2 lb

## 2021-11-25 VITALS — BP 122/70 | HR 75 | Resp 16 | Wt 184.0 lb

## 2021-11-25 DIAGNOSIS — G4701 Insomnia due to medical condition: Secondary | ICD-10-CM

## 2021-11-25 DIAGNOSIS — N186 End stage renal disease: Secondary | ICD-10-CM

## 2021-11-25 DIAGNOSIS — R69 Illness, unspecified: Secondary | ICD-10-CM | POA: Diagnosis not present

## 2021-11-25 DIAGNOSIS — F3176 Bipolar disorder, in full remission, most recent episode depressed: Secondary | ICD-10-CM
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
## 2021-11-26 DIAGNOSIS — Z992 Dependence on renal dialysis: Secondary | ICD-10-CM | POA: Diagnosis not present

## 2021-11-26 DIAGNOSIS — N186 End stage renal disease: Secondary | ICD-10-CM | POA: Diagnosis not present
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
## 2021-11-28 DIAGNOSIS — Z992 Dependence on renal dialysis: Secondary | ICD-10-CM | POA: Diagnosis not present

## 2021-11-28 DIAGNOSIS — N186 End stage renal disease: Secondary | ICD-10-CM | POA: Diagnosis not present

## 2021-11-30 ENCOUNTER — Encounter: Payer: Self-pay | Admitting: Nurse Practitioner

## 2021-11-30 ENCOUNTER — Ambulatory Visit (INDEPENDENT_AMBULATORY_CARE_PROVIDER_SITE_OTHER): Payer: Medicare HMO | Admitting: Nurse Practitioner

## 2021-11-30 ENCOUNTER — Ambulatory Visit: Payer: Self-pay

## 2021-11-30 VITALS — BP 128/77 | HR 69 | Temp 98.3°F | Wt 186.1 lb

## 2021-11-30 DIAGNOSIS — Z992 Dependence on renal dialysis: Secondary | ICD-10-CM

## 2021-11-30 DIAGNOSIS — R69 Illness, unspecified: Secondary | ICD-10-CM | POA: Diagnosis not present

## 2021-11-30 DIAGNOSIS — E782 Mixed hyperlipidemia: Secondary | ICD-10-CM | POA: Diagnosis not present

## 2021-11-30 DIAGNOSIS — N186 End stage renal disease: Secondary | ICD-10-CM

## 2021-11-30 DIAGNOSIS — I152 Hypertension secondary to endocrine disorders: Secondary | ICD-10-CM | POA: Diagnosis not present

## 2021-11-30 DIAGNOSIS — N2581 Secondary hyperparathyroidism of renal origin: Secondary | ICD-10-CM

## 2021-11-30 DIAGNOSIS — E1159 Type 2 diabetes mellitus with other circulatory complications: Secondary | ICD-10-CM

## 2021-11-30 DIAGNOSIS — E1129 Type 2 diabetes mellitus with other diabetic kidney complication: Secondary | ICD-10-CM

## 2021-11-30 DIAGNOSIS — F3176 Bipolar disorder, in full remission, most recent episode depressed: Secondary | ICD-10-CM

## 2021-11-30 DIAGNOSIS — Z0000 Encounter for general adult medical examination without abnormal findings: Secondary | ICD-10-CM

## 2021-11-30 LAB — URINALYSIS, ROUTINE W REFLEX MICROSCOPIC
Bilirubin, UA: NEGATIVE
Glucose, UA: NEGATIVE
Ketones, UA: NEGATIVE
Nitrite, UA: NEGATIVE
RBC, UA: NEGATIVE
Specific Gravity, UA: 1.02 (ref 1.005–1.030)
Urobilinogen, Ur: 0.2 mg/dL (ref 0.2–1.0)
pH, UA: 7 (ref 5.0–7.5)

## 2021-11-30 LAB — MICROSCOPIC EXAMINATION
Bacteria, UA: NONE SEEN
RBC, Urine: NONE SEEN /hpf (ref 0–2)

## 2021-11-30 NOTE — Telephone Encounter (Signed)
Pt had an appt with Dr today and saw on his paper  a mention of diabetic and stated must be error that he knew nothing about that. PT was probably referring to last reading of 5/7, today not  seen documented. He needs to have a conversation as seems to bothered him to see listed on chart as claims no one ever mentioned to him about any concern. FU at [phone]  Pt. Reports he saw "diabetic " on his diagnosis sheet and says he has not been told he is diabetic. Please advise pt. Answer Assessment - Initial Assessment Questions 1. REASON FOR CALL or QUESTION: "What is your reason for calling today?" or "How can I best help you?" or "What question do you have that I can help answer?"     Pt. Asking if he is diabetic.  Protocols used: Information Only Call - No Triage-A-AH

## 2021-11-30 NOTE — Assessment & Plan Note (Addendum)
Chronic. Has new AV fistula and receiving Hemodialysis.  Continue to follow up with Nephrology.  Labs have been stable. Not currently on medications.

## 2021-11-30 NOTE — Assessment & Plan Note (Signed)
Chronic.  Controlled.  Continue with current medication regimen.  Labs ordered today.  Return to clinic in 3 months for reevaluation.  Call sooner if concerns arise.

## 2021-11-30 NOTE — Telephone Encounter (Signed)
We have discussed this in the past.  His A1c has been elevated in diabetic range in the past.  However, right now it is well controlled without medication.  This has been on his chart prior to me meeting him so I am not positive when it was first added.

## 2021-11-30 NOTE — Assessment & Plan Note (Signed)
Stable.  Continues to follow up with Psychiatry.  Denies concerns at visit today.

## 2021-11-30 NOTE — Assessment & Plan Note (Signed)
Chronic, stable.  Managed by Nephrology.

## 2021-11-30 NOTE — Telephone Encounter (Signed)
Patient voiced understanding of message below

## 2021-12-01 ENCOUNTER — Ambulatory Visit: Payer: Self-pay | Admitting: Psychiatry

## 2021-12-01 ENCOUNTER — Ambulatory Visit (INDEPENDENT_AMBULATORY_CARE_PROVIDER_SITE_OTHER): Payer: Medicare HMO

## 2021-12-01 DIAGNOSIS — Z23 Encounter for immunization: Secondary | ICD-10-CM | POA: Diagnosis not present

## 2021-12-01 DIAGNOSIS — N186 End stage renal disease: Secondary | ICD-10-CM | POA: Diagnosis not present

## 2021-12-01 DIAGNOSIS — Z992 Dependence on renal dialysis: Secondary | ICD-10-CM | POA: Diagnosis not present

## 2021-12-01 NOTE — Progress Notes (Signed)
Please let patient know that his lab work looks good.  Thyroid labs have improved.  He will need an updated Hep B vaccine.  If Dialysis wants him to be vaccinated for MMR he will need an updated vaccine for that too. His anemia has improved.  His a1c is well controlled at 5.4. No other concerns at this time.  Follow up as discussed.

## 2021-12-02 DIAGNOSIS — D485 Neoplasm of uncertain behavior of skin: Secondary | ICD-10-CM | POA: Diagnosis not present

## 2021-12-02 DIAGNOSIS — C44622 Squamous cell carcinoma of skin of right upper limb, including shoulder: Secondary | ICD-10-CM | POA: Diagnosis not present

## 2021-12-02 DIAGNOSIS — G4733 Obstructive sleep apnea (adult) (pediatric): Secondary | ICD-10-CM | POA: Diagnosis not present

## 2021-12-03 ENCOUNTER — Ambulatory Visit: Payer: Self-pay | Admitting: Psychiatry

## 2021-12-03 ENCOUNTER — Ambulatory Visit: Payer: Medicare Other

## 2021-12-03 DIAGNOSIS — Z992 Dependence on renal dialysis: Secondary | ICD-10-CM | POA: Diagnosis not present

## 2021-12-03 DIAGNOSIS — N186 End stage renal disease: Secondary | ICD-10-CM | POA: Diagnosis not present

## 2021-12-03 LAB — CBC WITH DIFFERENTIAL/PLATELET
Basophils Absolute: 0 10*3/uL (ref 0.0–0.2)
Basos: 1 %
EOS (ABSOLUTE): 0.3 10*3/uL (ref 0.0–0.4)
Eos: 8 %
Hematocrit: 36.6 % — ABNORMAL LOW (ref 37.5–51.0)
Hemoglobin: 12.4 g/dL — ABNORMAL LOW (ref 13.0–17.7)
Immature Grans (Abs): 0 10*3/uL (ref 0.0–0.1)
Immature Granulocytes: 0 %
Lymphocytes Absolute: 0.8 10*3/uL (ref 0.7–3.1)
Lymphs: 24 %
MCH: 34.2 pg — ABNORMAL HIGH (ref 26.6–33.0)
MCHC: 33.9 g/dL (ref 31.5–35.7)
MCV: 101 fL — ABNORMAL HIGH (ref 79–97)
Monocytes Absolute: 0.4 10*3/uL (ref 0.1–0.9)
Monocytes: 12 %
Neutrophils Absolute: 1.9 10*3/uL (ref 1.4–7.0)
Neutrophils: 55 %
Platelets: 119 10*3/uL — ABNORMAL LOW (ref 150–450)
RBC: 3.63 x10E6/uL — ABNORMAL LOW (ref 4.14–5.80)
RDW: 14 % (ref 11.6–15.4)
WBC: 3.4 10*3/uL (ref 3.4–10.8)

## 2021-12-03 LAB — COMPREHENSIVE METABOLIC PANEL
ALT: 17 IU/L (ref 0–44)
AST: 20 IU/L (ref 0–40)
Albumin/Globulin Ratio: 1.8 (ref 1.2–2.2)
Albumin: 4.5 g/dL (ref 3.8–4.8)
Alkaline Phosphatase: 87 IU/L (ref 44–121)
BUN/Creatinine Ratio: 7 — ABNORMAL LOW (ref 10–24)
BUN: 36 mg/dL — ABNORMAL HIGH (ref 8–27)
Bilirubin Total: 0.4 mg/dL (ref 0.0–1.2)
CO2: 22 mmol/L (ref 20–29)
Calcium: 9.4 mg/dL (ref 8.6–10.2)
Chloride: 103 mmol/L (ref 96–106)
Creatinine, Ser: 4.95 mg/dL — ABNORMAL HIGH (ref 0.76–1.27)
Globulin, Total: 2.5 g/dL (ref 1.5–4.5)
Glucose: 95 mg/dL (ref 70–99)
Potassium: 3.6 mmol/L (ref 3.5–5.2)
Sodium: 148 mmol/L — ABNORMAL HIGH (ref 134–144)
Total Protein: 7 g/dL (ref 6.0–8.5)
eGFR: 11 mL/min/{1.73_m2} — ABNORMAL LOW (ref >59)

## 2021-12-03 LAB — MEASLES/MUMPS/RUBELLA IMMUNITY
MUMPS ABS, IGG: <9 AU/mL — ABNORMAL LOW (ref >10.9)
RUBEOLA AB, IGG: >300 AU/mL (ref >16.4)
Rubella Antibodies, IGG: 19.2 index (ref >0.99)

## 2021-12-03 LAB — LIPID PANEL
Chol/HDL Ratio: 2.9 ratio (ref 0.0–5.0)
Cholesterol, Total: 133 mg/dL (ref 100–199)
HDL: 46 mg/dL (ref >39)
LDL Chol Calc (NIH): 71 mg/dL (ref 0–99)
Triglycerides: 81 mg/dL (ref 0–149)
VLDL Cholesterol Cal: 16 mg/dL (ref 5–40)

## 2021-12-03 LAB — TSH: TSH: 0.207 u[IU]/mL — ABNORMAL LOW (ref 0.450–4.500)

## 2021-12-03 LAB — HEPATITIS B SURFACE ANTIBODY, QUANTITATIVE: Hepatitis B Surf Ab Quant: <3.1 m[IU]/mL — ABNORMAL LOW (ref >9.9)

## 2021-12-03 LAB — HEMOGLOBIN A1C
Est. average glucose Bld gHb Est-mCnc: 108 mg/dL
Hgb A1c MFr Bld: 5.4 % (ref 4.8–5.6)

## 2021-12-04 ENCOUNTER — Ambulatory Visit: Payer: Medicare Other

## 2021-12-05 DIAGNOSIS — Z992 Dependence on renal dialysis: Secondary | ICD-10-CM | POA: Diagnosis not present

## 2021-12-05 DIAGNOSIS — N186 End stage renal disease: Secondary | ICD-10-CM | POA: Diagnosis not present

## 2021-12-07 ENCOUNTER — Ambulatory Visit (INDEPENDENT_AMBULATORY_CARE_PROVIDER_SITE_OTHER): Payer: Medicare HMO | Admitting: *Deleted

## 2021-12-07 DIAGNOSIS — Z0000 Encounter for general adult medical examination without abnormal findings: Secondary | ICD-10-CM
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
## 2021-12-07 NOTE — Patient Instructions (Signed)
[name] , Thank you for taking time to come for your Medicare Wellness Visit. I appreciate your ongoing commitment to your health goals. Please review the following plan we discussed and let me know if I can assist you in the future.   Screening recommendations/referrals: Colonoscopy: up to date Recommended yearly ophthalmology/optometry visit for glaucoma screening and checkup Recommended yearly dental visit for hygiene and checkup  Vaccinations: Influenza vaccine: up to date Pneumococcal vaccine: up to date Tdap vaccine:up to date Shingles vaccine: Education provided    Advanced directives: not on file  Conditions/risks identified:   Next appointment: 02-24-2022 @ 1:00  [facility] 54 Years and Older, Male Preventive care refers to lifestyle choices and visits with your health care provider that can promote health and wellness. What does preventive care include? A yearly physical exam. This is also called an annual well check. Dental exams once or twice a year. Routine eye exams. Ask your health care provider how often you should have your eyes checked. Personal lifestyle choices, including: Daily care of your teeth and gums. Regular physical activity. Eating a healthy diet. Avoiding tobacco and drug use. Limiting alcohol use. Practicing safe sex. Taking low doses of aspirin every day. Taking vitamin and mineral supplements as recommended by your health care provider. What happens during an annual well check? The services and screenings done by your health care provider during your annual well check will depend on your age, overall health, lifestyle risk factors, and family history of disease. Counseling  Your health care provider may ask you questions about your: Alcohol use. Tobacco use. Drug use. Emotional well-being. Home and relationship well-being. Sexual activity. Eating habits. History of falls. Memory and ability to understand  (cognition). Work and work Statistician. Screening  You may have the following tests or measurements: Height, weight, and BMI. Blood pressure. Lipid and cholesterol levels. These may be checked every 5 years, or more frequently if you are over 92 years old. Skin check. Lung cancer screening. You may have this screening every year starting at age 52 if you have a 30-pack-year history of smoking and currently smoke or have quit within the past 15 years. Fecal occult blood test (FOBT) of the stool. You may have this test every year starting at age 2. Flexible sigmoidoscopy or colonoscopy. You may have a sigmoidoscopy every 5 years or a colonoscopy every 10 years starting at age 37. Prostate cancer screening. Recommendations will vary depending on your family history and other risks. Hepatitis C blood test. Hepatitis B blood test. Sexually transmitted disease (STD) testing. Diabetes screening. This is done by checking your blood sugar (glucose) after you have not eaten for a while (fasting). You may have this done every 1-3 years. Abdominal aortic aneurysm (AAA) screening. You may need this if you are a current or former smoker. Osteoporosis. You may be screened starting at age 74 if you are at high risk. Talk with your health care provider about your test results, treatment options, and if necessary, the need for more tests. Vaccines  Your health care provider may recommend certain vaccines, such as: Influenza vaccine. This is recommended every year. Tetanus, diphtheria, and acellular pertussis (Tdap, Td) vaccine. You may need a Td booster every 10 years. Zoster vaccine. You may need this after age 34. Pneumococcal 13-valent conjugate (PCV13) vaccine. One dose is recommended after age 72. Pneumococcal polysaccharide (PPSV23) vaccine. One dose is recommended after age 26. Talk to your health care provider about which screenings and  vaccines you need and how often you need them. This  information is not intended to replace advice given to you by your health care provider. Make sure you discuss any questions you have with your health care provider. Document Released: 04/04/2015 Document Revised: 11/26/2015 Document Reviewed: 01/07/2015 Elsevier Interactive Patient Education  2017 [employer] Prevention in the Home Falls can cause injuries. They can happen to people of all ages. There are many things you can do to make your home safe and to help prevent falls. What can I do on the outside of my home? Regularly fix the edges of walkways and driveways and fix any cracks. Remove anything that might make you trip as you walk through a door, such as a raised step or threshold. Trim any bushes or trees on the path to your home. Use bright outdoor lighting. Clear any walking paths of anything that might make someone trip, such as rocks or tools. Regularly check to see if handrails are loose or broken. Make sure that both sides of any steps have handrails. Any raised decks and porches should have guardrails on the edges. Have any leaves, snow, or ice cleared regularly. Use sand or salt on walking paths during winter. Clean up any spills in your garage right away. This includes oil or grease spills. What can I do in the bathroom? Use night lights. Install grab bars by the toilet and in the tub and shower. Do not use towel bars as grab bars. Use non-skid mats or decals in the tub or shower. If you need to sit down in the shower, use a plastic, non-slip stool. Keep the floor dry. Clean up any water that spills on the floor as soon as it happens. Remove soap buildup in the tub or shower regularly. Attach bath mats securely with double-sided non-slip rug tape. Do not have throw rugs and other things on the floor that can make you trip. What can I do in the bedroom? Use night lights. Make sure that you have a light by your bed that is easy to reach. Do not use any sheets or  blankets that are too big for your bed. They should not hang down onto the floor. Have a firm chair that has side arms. You can use this for support while you get dressed. Do not have throw rugs and other things on the floor that can make you trip. What can I do in the kitchen? Clean up any spills right away. Avoid walking on wet floors. Keep items that you use a lot in easy-to-reach places. If you need to reach something above you, use a strong step stool that has a grab bar. Keep electrical cords out of the way. Do not use floor polish or wax that makes floors slippery. If you must use wax, use non-skid floor wax. Do not have throw rugs and other things on the floor that can make you trip. What can I do with my stairs? Do not leave any items on the stairs. Make sure that there are handrails on both sides of the stairs and use them. Fix handrails that are broken or loose. Make sure that handrails are as long as the stairways. Check any carpeting to make sure that it is firmly attached to the stairs. Fix any carpet that is loose or worn. Avoid having throw rugs at the top or bottom of the stairs. If you do have throw rugs, attach them to the floor with carpet tape. Make  sure that you have a light switch at the top of the stairs and the bottom of the stairs. If you do not have them, ask someone to add them for you. What else can I do to help prevent falls? Wear shoes that: Do not have high heels. Have rubber bottoms. Are comfortable and fit you well. Are closed at the toe. Do not wear sandals. If you use a stepladder: Make sure that it is fully opened. Do not climb a closed stepladder. Make sure that both sides of the stepladder are locked into place. Ask someone to hold it for you, if possible. Clearly mark and make sure that you can see: Any grab bars or handrails. First and last steps. Where the edge of each step is. Use tools that help you move around (mobility aids) if they are  needed. These include: Canes. Walkers. Scooters. Crutches. Turn on the lights when you go into a dark area. Replace any light bulbs as soon as they burn out. Set up your furniture so you have a clear path. Avoid moving your furniture around. If any of your floors are uneven, fix them. If there are any pets around you, be aware of where they are. Review your medicines with your doctor. Some medicines can make you feel dizzy. This can increase your chance of falling. Ask your doctor what other things that you can do to help prevent falls. This information is not intended to replace advice given to you by your health care provider. Make sure you discuss any questions you have with your health care provider. Document Released: 01/02/2009 Document Revised: 08/14/2015 Document Reviewed: 04/12/2014 Elsevier Interactive Patient Education  2017 [employer].

## 2021-12-08 DIAGNOSIS — Z992 Dependence on renal dialysis: Secondary | ICD-10-CM | POA: Diagnosis not present

## 2021-12-08 DIAGNOSIS — N186 End stage renal disease: Secondary | ICD-10-CM | POA: Diagnosis not present

## 2021-12-10 DIAGNOSIS — Z992 Dependence on renal dialysis: Secondary | ICD-10-CM | POA: Diagnosis not present

## 2021-12-10 DIAGNOSIS — N186 End stage renal disease: Secondary | ICD-10-CM | POA: Diagnosis not present

## 2021-12-11 ENCOUNTER — Ambulatory Visit (INDEPENDENT_AMBULATORY_CARE_PROVIDER_SITE_OTHER): Payer: Medicare HMO

## 2021-12-11 DIAGNOSIS — Z23 Encounter for immunization: Secondary | ICD-10-CM

## 2021-12-12 DIAGNOSIS — Z992 Dependence on renal dialysis: Secondary | ICD-10-CM | POA: Diagnosis not present

## 2021-12-12 DIAGNOSIS — N186 End stage renal disease: Secondary | ICD-10-CM | POA: Diagnosis not present

## 2021-12-14 DIAGNOSIS — E0590 Thyrotoxicosis, unspecified without thyrotoxic crisis or storm: Secondary | ICD-10-CM | POA: Diagnosis not present

## 2021-12-15 DIAGNOSIS — Z992 Dependence on renal dialysis: Secondary | ICD-10-CM | POA: Diagnosis not present

## 2021-12-15 DIAGNOSIS — N186 End stage renal disease: Secondary | ICD-10-CM | POA: Diagnosis not present

## 2021-12-17 DIAGNOSIS — N186 End stage renal disease: Secondary | ICD-10-CM | POA: Diagnosis not present

## 2021-12-17 DIAGNOSIS — Z992 Dependence on renal dialysis: Secondary | ICD-10-CM | POA: Diagnosis not present

## 2021-12-18 ENCOUNTER — Encounter (INDEPENDENT_AMBULATORY_CARE_PROVIDER_SITE_OTHER): Payer: Self-pay | Admitting: Nurse Practitioner
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
## 2021-12-19 DIAGNOSIS — N186 End stage renal disease: Secondary | ICD-10-CM | POA: Diagnosis not present

## 2021-12-19 DIAGNOSIS — Z992 Dependence on renal dialysis: Secondary | ICD-10-CM | POA: Diagnosis not present

## 2021-12-22 DIAGNOSIS — Z992 Dependence on renal dialysis: Secondary | ICD-10-CM | POA: Diagnosis not present

## 2021-12-22 DIAGNOSIS — N186 End stage renal disease: Secondary | ICD-10-CM | POA: Diagnosis not present

## 2021-12-23 ENCOUNTER — Other Ambulatory Visit (INDEPENDENT_AMBULATORY_CARE_PROVIDER_SITE_OTHER): Payer: Self-pay | Admitting: Nurse Practitioner

## 2021-12-23 DIAGNOSIS — N186 End stage renal disease: Secondary | ICD-10-CM

## 2021-12-24 DIAGNOSIS — N186 End stage renal disease: Secondary | ICD-10-CM | POA: Diagnosis not present

## 2021-12-24 DIAGNOSIS — Z992 Dependence on renal dialysis: Secondary | ICD-10-CM | POA: Diagnosis not present

## 2021-12-24 DIAGNOSIS — G4733 Obstructive sleep apnea (adult) (pediatric): Secondary | ICD-10-CM | POA: Diagnosis not present

## 2021-12-25 ENCOUNTER — Encounter (INDEPENDENT_AMBULATORY_CARE_PROVIDER_SITE_OTHER): Payer: Self-pay | Admitting: Vascular Surgery

## 2021-12-25 ENCOUNTER — Ambulatory Visit (INDEPENDENT_AMBULATORY_CARE_PROVIDER_SITE_OTHER): Payer: Medicare HMO

## 2021-12-25 ENCOUNTER — Ambulatory Visit (INDEPENDENT_AMBULATORY_CARE_PROVIDER_SITE_OTHER): Payer: Medicare HMO | Admitting: Vascular Surgery

## 2021-12-25 VITALS — BP 130/70 | HR 71 | Resp 18 | Ht 66.0 in | Wt 182.0 lb

## 2021-12-25 DIAGNOSIS — I152 Hypertension secondary to endocrine disorders: Secondary | ICD-10-CM

## 2021-12-25 DIAGNOSIS — N186 End stage renal disease: Secondary | ICD-10-CM | POA: Diagnosis not present

## 2021-12-25 DIAGNOSIS — Z992 Dependence on renal dialysis: Secondary | ICD-10-CM

## 2021-12-25 DIAGNOSIS — E1129 Type 2 diabetes mellitus with other diabetic kidney complication: Secondary | ICD-10-CM

## 2021-12-25 DIAGNOSIS — E1159 Type 2 diabetes mellitus with other circulatory complications: Secondary | ICD-10-CM

## 2021-12-25 NOTE — Assessment & Plan Note (Signed)
blood glucose control important in reducing the progression of atherosclerotic disease. Also, involved in wound healing. On appropriate medications.  

## 2021-12-25 NOTE — Assessment & Plan Note (Signed)
blood pressure control important in reducing the progression of atherosclerotic disease. On appropriate oral medications.  
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
## 2021-12-25 NOTE — Assessment & Plan Note (Signed)
I would not yet start using the graft for about 2 more weeks to allow the hematoma to continue to improve.  We will see him back in 2 to 3 months with a duplex for follow-up.  We may have to intervene on the venous anastomotic stenosis at some point going forward.

## 2021-12-26 DIAGNOSIS — Z992 Dependence on renal dialysis: Secondary | ICD-10-CM | POA: Diagnosis not present

## 2021-12-26 DIAGNOSIS — N186 End stage renal disease: Secondary | ICD-10-CM | POA: Diagnosis not present

## 2021-12-28 DIAGNOSIS — C44622 Squamous cell carcinoma of skin of right upper limb, including shoulder: Secondary | ICD-10-CM | POA: Diagnosis not present

## 2021-12-28 DIAGNOSIS — D2361 Other benign neoplasm of skin of right upper limb, including shoulder: Secondary | ICD-10-CM | POA: Diagnosis not present

## 2021-12-29 DIAGNOSIS — N186 End stage renal disease: Secondary | ICD-10-CM | POA: Diagnosis not present

## 2021-12-29 DIAGNOSIS — Z992 Dependence on renal dialysis: Secondary | ICD-10-CM | POA: Diagnosis not present

## 2021-12-31 ENCOUNTER — Telehealth: Payer: Self-pay

## 2021-12-31 DIAGNOSIS — F3176 Bipolar disorder, in full remission, most recent episode depressed: Secondary | ICD-10-CM

## 2021-12-31 DIAGNOSIS — N186 End stage renal disease: Secondary | ICD-10-CM | POA: Diagnosis not present

## 2021-12-31 DIAGNOSIS — G4701 Insomnia due to medical condition: Secondary | ICD-10-CM

## 2021-12-31 DIAGNOSIS — Z992 Dependence on renal dialysis: Secondary | ICD-10-CM | POA: Diagnosis not present

## 2021-12-31 MED ORDER — BELSOMRA 5 MG PO TABS: 5.0000 mg | ORAL_TABLET | Freq: Every day | ORAL | 0 refills | Status: DC

## 2021-12-31 NOTE — Telephone Encounter (Signed)
Spoke to patient, stop trazodone.  Start Belsomra 5 mg, I have sent it to pharmacy.

## 2021-12-31 NOTE — Telephone Encounter (Signed)
Medication management - Message left by patient to follow up if Dr. Shea Miller was ever able to contact his "kidney doctor, Dr. Holley Miller" as pt stated this was the plan at their last visit for her to discuss sleep issues with Dr. Holley Miller.  Patient would like to know if any contact has been made or any change in medications or treatment.

## 2022-01-01 DIAGNOSIS — G4733 Obstructive sleep apnea (adult) (pediatric): Secondary | ICD-10-CM | POA: Diagnosis not present

## 2022-01-02 DIAGNOSIS — N186 End stage renal disease: Secondary | ICD-10-CM | POA: Diagnosis not present

## 2022-01-02 DIAGNOSIS — Z992 Dependence on renal dialysis: Secondary | ICD-10-CM | POA: Diagnosis not present

## 2022-01-05 ENCOUNTER — Telehealth: Payer: Self-pay

## 2022-01-05 DIAGNOSIS — N186 End stage renal disease: Secondary | ICD-10-CM | POA: Diagnosis not present

## 2022-01-05 DIAGNOSIS — Z992 Dependence on renal dialysis: Secondary | ICD-10-CM | POA: Diagnosis not present

## 2022-01-05 NOTE — Telephone Encounter (Signed)
noted 

## 2022-01-05 NOTE — Telephone Encounter (Signed)
Patient has an appt tomorrow he called to report that he has not been able to sleep for 3 nights after starting the Belsomra he is not going to take a dose tonight will discuss with you at his appt.   Suvorexant (BELSOMRA) 5 MG TABS 30 tablet 0 12/31/2021    Sig - Route: Take 5 mg by mouth at bedtime. Stop Trazodone - Oral   Sent to pharmacy as: Suvorexant ([place]) 5 MG Tab   E-Prescribing Status: Receipt confirmed by pharmacy (12/31/2021  5:58 PM EDT)

## 2022-01-06 ENCOUNTER — Telehealth: Payer: Self-pay | Admitting: Psychiatry

## 2022-01-06 ENCOUNTER — Encounter: Payer: Self-pay | Admitting: Psychiatry

## 2022-01-06 ENCOUNTER — Ambulatory Visit (INDEPENDENT_AMBULATORY_CARE_PROVIDER_SITE_OTHER): Payer: Medicare HMO | Admitting: Psychiatry

## 2022-01-06 VITALS — BP 153/67 | HR 69 | Temp 97.9°F | Ht 66.0 in | Wt 183.2 lb

## 2022-01-06 DIAGNOSIS — F3176 Bipolar disorder, in full remission, most recent episode depressed: Secondary | ICD-10-CM

## 2022-01-06 DIAGNOSIS — R69 Illness, unspecified: Secondary | ICD-10-CM | POA: Diagnosis not present

## 2022-01-06 DIAGNOSIS — F3131 Bipolar disorder, current episode depressed, mild: Secondary | ICD-10-CM | POA: Diagnosis not present

## 2022-01-06 DIAGNOSIS — G4701 Insomnia due to medical condition: Secondary | ICD-10-CM

## 2022-01-06 DIAGNOSIS — F419 Anxiety disorder, unspecified: Secondary | ICD-10-CM | POA: Insufficient documentation

## 2022-01-06 MED ORDER — OLANZAPINE 5 MG PO TABS: 2.5000 mg | ORAL_TABLET | ORAL | 0 refills | Status: DC

## 2022-01-06 NOTE — Patient Instructions (Signed)
Olanzapine Tablets What is this medication? OLANZAPINE (oh LAN za peen) treats schizophrenia and bipolar disorder. It works by balancing the levels of dopamine and serotonin in your brain, substances that help regulate mood, behaviors, and thoughts. It belongs to a group of medications called antipsychotics. Antipsychotic medications can be used to treat several kinds of mental health conditions. This medicine may be used for other purposes; ask your health care provider or pharmacist if you have questions. COMMON BRAND NAME(S): Zyprexa What should I tell my care team before I take this medication? They need to know if you have any of these conditions: Blockage in your bowel Constipation Dementia Diabetes Difficulty swallowing Glaucoma Have trouble controlling your muscles Heart disease High cholesterol High levels of prolactin History of breast cancer History of irregular heartbeat History of stroke Liver disease Low blood cell levels, such as low white cell, platelet, or red cell levels Low blood pressure Parkinson disease Prostate disease Seizures Suicidal thoughts, plans or attempt; a previous suicide attempt by you or a family member Tobacco use Trouble passing urine An unusual or allergic reaction to olanzapine, other medications, foods, dyes, or preservatives Pregnant or trying to get pregnant Breast-feeding How should I use this medication? Take this medication by mouth. Swallow it with a drink of water. Follow the directions on the prescription label. Take your medication at regular intervals. Do not take it more often than directed. Do not stop taking except on the advice of your care team. A special MedGuide will be given to you by the pharmacist with each new prescription and refill. Be sure to read this information carefully each time. Talk to your care team about the use of this medication in children. While this medication may be prescribed for children as young as  13 years for selected conditions, precautions do apply. Overdosage: If you think you have taken too much of this medicine contact a poison control center or emergency room at once. NOTE: This medicine is only for you. Do not share this medicine with others. What if I miss a dose? If you miss a dose, take it as soon as you can. If it is almost time for your next dose, take only that dose. Do not take double or extra doses. What may interact with this medication? Do not take this medication with any of the following: Dronedarone Cisapride Metoclopramide Pimozide Thioridazine This medication may also interact with the following: Alcohol Antihistamines for allergy, cough, and cold Atropine Carbamazepine Certain medications for anxiety or sleep Certain medications for bladder problems like oxybutynin, tolterodine Certain medications for depression like amitriptyline, fluoxetine, sertraline Certain medications for stomach problems like dicyclomine, hyoscyamine Certain medications for travel sickness like scopolamine Fluvoxamine General anesthetics like halothane, isoflurane, methoxyflurane, propofol Levodopa or other medications for Parkinson's disease Medications for blood pressure Medications for seizures Medications that relax muscles for surgery Narcotic medications for pain Omeprazole Other medications that prolong the QT interval (an abnormal heart rhythm) Phenothiazines like chlorpromazine, prochlorperazine Rifampin This list may not describe all possible interactions. Give your health care provider a list of all the medicines, herbs, non-prescription drugs, or dietary supplements you use. Also tell them if you smoke, drink alcohol, or use illegal drugs. Some items may interact with your medicine. What should I watch for while using this medication? Visit your care team for regular checks on your progress. Tell your care team if symptoms do not start to get better or if they get  worse. Do not stop taking except on  your care team's advice. You may develop a severe reaction. Your care team will tell you how much medication to take. This medication may cause serious skin reactions. They can happen weeks to months after starting the medication. Contact your care team right away if you notice fevers or flu-like symptoms with a rash. The rash may be red or purple and then turn into blisters or peeling of the skin. Or, you might notice a red rash with swelling of the face, lips or lymph nodes in your neck or under your arms. You may get dizzy or drowsy. Do not drive, use machinery, or do anything that needs mental alertness until you know how this medication affects you. Do not stand or sit up quickly, especially if you are an older patient. This reduces the risk of dizzy or fainting spells. Alcohol may interfere with the effect of this medication. Avoid alcoholic drinks. Your mouth may get dry. Chewing sugarless gum or sucking hard candy, and drinking plenty of water will help. This medication can cause problems with controlling your body temperature. It can lower the response of your body to cold temperatures. If possible, stay indoors during cold weather. If you must go outdoors, wear warm clothes. It can also lower the response of your body to heat. Do not overheat. Do not over-exercise. Stay out of the sun when possible. If you must be in the sun, wear cool clothing. Drink plenty of water. If you have trouble controlling your body temperature, call your care team right away. This medication may increase blood sugar. Ask your care team if changes in diet or medications are needed if you have diabetes. If you smoke, tell your care team if you notice this medication is not working well for you. Talk to your care team if you are a smoker or if you decide to stop smoking. What side effects may I notice from receiving this medication? Side effects that you should report to your care team as  soon as possible: Allergic reactions--skin rash, itching, hives, swelling of the face, lips, tongue, or throat High blood sugar (hyperglycemia)--increased thirst or amount of urine, unusual weakness or fatigue, blurry vision High fever, stiff muscles, increased sweating, fast or irregular heartbeat, and confusion, which may be signs of neuroleptic malignant syndrome High prolactin level--unexpected breast tissue growth, discharge from the nipple, change in sex drive or performance, irregular menstrual cycle Infection--fever, chills, cough, or sore throat Low blood pressure--dizziness, feeling faint or lightheaded, blurry vision Pain or trouble swallowing Seizures Stroke--sudden numbness or weakness of the face, arm, or leg, trouble speaking, confusion, trouble walking, loss of balance or coordination, dizziness, severe headache, change in vision Thoughts of suicide or self-harm, worsening mood, feelings of depression Trouble passing urine Uncontrolled and repetitive body movements, muscle stiffness or spasms, tremors or shaking, loss of balance or coordination, restlessness, shuffling walk, which may be signs of extrapyramidal symptoms (EPS) Side effects that usually do not require medical attention (report to your care team if they continue or are bothersome): Constipation Dizziness Drowsiness Dry mouth Headache Upset stomach Weight gain This list may not describe all possible side effects. Call your doctor for medical advice about side effects. You may report side effects to FDA at 1-800-FDA-1088. Where should I keep my medication? Keep out of the reach of children. Store at controlled room temperature between 15 and 30 degrees C (59 and 86 degrees F). Protect from light and moisture. Throw away any unused medication after the expiration date. NOTE: This  sheet is a summary. It may not cover all possible information. If you have questions about this medicine, talk to your doctor,  pharmacist, or health care provider.  2023 Elsevier/Gold Standard (2020-10-10 00:00:00)
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
## 2022-01-06 NOTE — Telephone Encounter (Signed)
Patient called in stating he was instructed to start OLANZapine tonight and to cut the pills in half. He said that his pill cutter just smashes the pill when he tries to cut them since they are so small.   He is seeking advice on what he should do to make sure he is taking half of the pill.  Last ordered: 01/06/22 Last visit:01/06/22 Next visit: 01/25/22  Phone # [phone]

## 2022-01-06 NOTE — Telephone Encounter (Signed)
Please let patient know he can take one whole tablet if he has trouble splitting.

## 2022-01-07 ENCOUNTER — Other Ambulatory Visit: Payer: Self-pay | Admitting: Nurse Practitioner

## 2022-01-07 DIAGNOSIS — Z992 Dependence on renal dialysis: Secondary | ICD-10-CM | POA: Diagnosis not present

## 2022-01-07 DIAGNOSIS — N186 End stage renal disease: Secondary | ICD-10-CM | POA: Diagnosis not present

## 2022-01-07 NOTE — Telephone Encounter (Signed)
last RF 07/29/21 #90 1 RF (has enough until 01/29/22)  Requested Prescriptions  Refused Prescriptions Disp Refills  . loratadine (CLARITIN) 10 MG tablet [Pharmacy Med Name: LORATADINE 10 MG TABLET] 90 tablet 1    Sig: TAKE 1 TABLET BY MOUTH EVERY DAY     Ear, Nose, and Throat:  Antihistamines 2 Failed - 01/07/2022  3:33 AM      Failed - Cr in normal range and within 360 days    Creatinine  Date Value Ref Range Status  03/08/2014 2.22 (H) 0.60 - 1.30 mg/dL Final   Creatinine, Ser  Date Value Ref Range Status  11/30/2021 4.95 (H) 0.76 - 1.27 mg/dL Final   Creatinine, Urine  Date Value Ref Range Status  08/01/2019 194 mg/dL Final    Comment:    Performed at [facility], [address], [place], [place] [address]         Passed - Valid encounter within last 12 months    Recent Outpatient Visits          1 month ago Annual physical exam   [facility] Jon Billings, NP   3 months ago Cellulitis of left lower leg   [facility] [place], Alafaya T, NP   4 months ago Cellulitis of left lower leg   [facility] [place], Fairlee T, NP   4 months ago Essential hypertension   [place], NP   5 months ago Nasal congestion   [facility] Jon Billings, NP      Future Appointments            In 1 month Jon Billings, NP [facility] [place], [place]   In 1 month Jon Billings, NP [employer], [place]   In 4 months [place], Shannon A, [place]

## 2022-01-09 ENCOUNTER — Other Ambulatory Visit: Payer: Self-pay | Admitting: Nurse Practitioner

## 2022-01-09 DIAGNOSIS — N186 End stage renal disease: Secondary | ICD-10-CM | POA: Diagnosis not present

## 2022-01-09 DIAGNOSIS — Z992 Dependence on renal dialysis: Secondary | ICD-10-CM | POA: Diagnosis not present

## 2022-01-09 DIAGNOSIS — I10 Essential (primary) hypertension: Secondary | ICD-10-CM

## 2022-01-11 ENCOUNTER — Ambulatory Visit: Payer: Self-pay | Admitting: *Deleted

## 2022-01-11 MED ORDER — LORATADINE 10 MG PO TABS: 10.0000 mg | ORAL_TABLET | Freq: Every day | ORAL | 1 refills | Status: DC

## 2022-01-11 NOTE — Telephone Encounter (Signed)
Requested Prescriptions  Pending Prescriptions Disp Refills  . amLODipine (NORVASC) 10 MG tablet [Pharmacy Med Name: AMLODIPINE TAB 10MG] 90 tablet 1    Sig: TAKE 1 TABLET DAILY     Cardiovascular: Calcium Channel Blockers 2 Failed - 01/09/2022  8:32 AM      Failed - Last BP in normal range    BP Readings from Last 1 Encounters:  12/25/21 130/70         Passed - Last Heart Rate in normal range    Pulse Readings from Last 1 Encounters:  12/25/21 71         Passed - Valid encounter within last 6 months    Recent Outpatient Visits          1 month ago Annual physical exam   [facility] Jon Billings, NP   3 months ago Cellulitis of left lower leg   [facility] [place], [place] T, NP   4 months ago Cellulitis of left lower leg   [facility] [place], [place] T, NP   4 months ago Essential hypertension   [place], NP   5 months ago Nasal congestion   [facility] Jon Billings, NP      Future Appointments            In 1 month Jon Billings, NP [facility], [place]   In 1 month Jon Billings, NP [employer], [place]   In 4 months [place], Shannon A, [place]

## 2022-01-11 NOTE — Telephone Encounter (Signed)
Summary: Medication Advic   Pt is calling to ask should he continue to take lamoTRIgine (LAMICTAL) 25 MG tablet [[id number]] ? If so he would need a refill [facility] [place].      Pt meant to ask for a refill of Loradadine, not Lomotrigine.    Answer Assessment - Initial Assessment Questions 1. REASON FOR CALL or QUESTION: "What is your reason for calling today?" or "How can I best help you?" or "What question do you have that I can help answer?"     Pt wondering if he needs to continue to  take Loratadine, not Lamictal.  Protocols used: Information Only Call - No Triage-A-AH

## 2022-01-12 DIAGNOSIS — Z992 Dependence on renal dialysis: Secondary | ICD-10-CM | POA: Diagnosis not present

## 2022-01-12 DIAGNOSIS — N186 End stage renal disease: Secondary | ICD-10-CM | POA: Diagnosis not present

## 2022-01-14 DIAGNOSIS — N186 End stage renal disease: Secondary | ICD-10-CM | POA: Diagnosis not present

## 2022-01-14 DIAGNOSIS — Z992 Dependence on renal dialysis: Secondary | ICD-10-CM | POA: Diagnosis not present

## 2022-01-16 DIAGNOSIS — Z992 Dependence on renal dialysis: Secondary | ICD-10-CM | POA: Diagnosis not present

## 2022-01-16 DIAGNOSIS — N186 End stage renal disease: Secondary | ICD-10-CM | POA: Diagnosis not present

## 2022-01-18 ENCOUNTER — Encounter (INDEPENDENT_AMBULATORY_CARE_PROVIDER_SITE_OTHER): Payer: Self-pay

## 2022-01-19 DIAGNOSIS — N186 End stage renal disease: Secondary | ICD-10-CM | POA: Diagnosis not present

## 2022-01-19 DIAGNOSIS — Z992 Dependence on renal dialysis: Secondary | ICD-10-CM | POA: Diagnosis not present

## 2022-01-21 DIAGNOSIS — N250 Renal osteodystrophy: Secondary | ICD-10-CM | POA: Diagnosis not present

## 2022-01-21 DIAGNOSIS — Z992 Dependence on renal dialysis: Secondary | ICD-10-CM | POA: Diagnosis not present

## 2022-01-21 DIAGNOSIS — N186 End stage renal disease: Secondary | ICD-10-CM | POA: Diagnosis not present

## 2022-01-21 DIAGNOSIS — D631 Anemia in chronic kidney disease: Secondary | ICD-10-CM | POA: Diagnosis not present

## 2022-01-21 DIAGNOSIS — N2581 Secondary hyperparathyroidism of renal origin: Secondary | ICD-10-CM | POA: Diagnosis not present

## 2022-01-21 DIAGNOSIS — D509 Iron deficiency anemia, unspecified: Secondary | ICD-10-CM | POA: Diagnosis not present

## 2022-01-22 ENCOUNTER — Telehealth (INDEPENDENT_AMBULATORY_CARE_PROVIDER_SITE_OTHER): Payer: Self-pay

## 2022-01-22 NOTE — Telephone Encounter (Signed)
Spoke with the patient and he is scheduled Dr. Lucky Cowboy on 02/01/22 with a 1:00 pm arrival time to the [facility] for a permcath removal. Pre-procedure instructions were discussed and will be mailed.

## 2022-01-23 DIAGNOSIS — Z992 Dependence on renal dialysis: Secondary | ICD-10-CM | POA: Diagnosis not present

## 2022-01-23 DIAGNOSIS — D509 Iron deficiency anemia, unspecified: Secondary | ICD-10-CM | POA: Diagnosis not present

## 2022-01-23 DIAGNOSIS — N250 Renal osteodystrophy: Secondary | ICD-10-CM | POA: Diagnosis not present

## 2022-01-23 DIAGNOSIS — D631 Anemia in chronic kidney disease: Secondary | ICD-10-CM | POA: Diagnosis not present

## 2022-01-23 DIAGNOSIS — N2581 Secondary hyperparathyroidism of renal origin: Secondary | ICD-10-CM | POA: Diagnosis not present

## 2022-01-23 DIAGNOSIS — N186 End stage renal disease: Secondary | ICD-10-CM | POA: Diagnosis not present

## 2022-01-25 ENCOUNTER — Ambulatory Visit (INDEPENDENT_AMBULATORY_CARE_PROVIDER_SITE_OTHER): Payer: Medicare Other | Admitting: Psychiatry

## 2022-01-25 ENCOUNTER — Encounter: Payer: Self-pay | Admitting: Psychiatry

## 2022-01-25 VITALS — BP 127/66 | HR 86 | Temp 98.1°F | Ht 66.0 in | Wt 187.0 lb

## 2022-01-25 DIAGNOSIS — F3131 Bipolar disorder, current episode depressed, mild: Secondary | ICD-10-CM

## 2022-01-25 DIAGNOSIS — G4701 Insomnia due to medical condition: Secondary | ICD-10-CM | POA: Diagnosis not present

## 2022-01-25 MED ORDER — LAMOTRIGINE 25 MG PO TABS: 25.0000 mg | ORAL_TABLET | Freq: Every day | ORAL | 1 refills | Status: DC

## 2022-01-25 MED ORDER — OLANZAPINE 5 MG PO TABS: 5.0000 mg | ORAL_TABLET | Freq: Every day | ORAL | 1 refills | Status: DC
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
## 2022-01-26 DIAGNOSIS — D509 Iron deficiency anemia, unspecified: Secondary | ICD-10-CM | POA: Diagnosis not present

## 2022-01-26 DIAGNOSIS — N186 End stage renal disease: Secondary | ICD-10-CM | POA: Diagnosis not present

## 2022-01-26 DIAGNOSIS — N2581 Secondary hyperparathyroidism of renal origin: Secondary | ICD-10-CM | POA: Diagnosis not present

## 2022-01-26 DIAGNOSIS — Z992 Dependence on renal dialysis: Secondary | ICD-10-CM | POA: Diagnosis not present

## 2022-01-26 DIAGNOSIS — D631 Anemia in chronic kidney disease: Secondary | ICD-10-CM | POA: Diagnosis not present

## 2022-01-26 DIAGNOSIS — N250 Renal osteodystrophy: Secondary | ICD-10-CM | POA: Diagnosis not present

## 2022-01-28 DIAGNOSIS — N186 End stage renal disease: Secondary | ICD-10-CM | POA: Diagnosis not present

## 2022-01-28 DIAGNOSIS — N250 Renal osteodystrophy: Secondary | ICD-10-CM | POA: Diagnosis not present

## 2022-01-28 DIAGNOSIS — D631 Anemia in chronic kidney disease: Secondary | ICD-10-CM | POA: Diagnosis not present

## 2022-01-28 DIAGNOSIS — N2581 Secondary hyperparathyroidism of renal origin: Secondary | ICD-10-CM | POA: Diagnosis not present

## 2022-01-28 DIAGNOSIS — Z992 Dependence on renal dialysis: Secondary | ICD-10-CM | POA: Diagnosis not present

## 2022-01-28 DIAGNOSIS — D509 Iron deficiency anemia, unspecified: Secondary | ICD-10-CM | POA: Diagnosis not present

## 2022-01-30 DIAGNOSIS — N2581 Secondary hyperparathyroidism of renal origin: Secondary | ICD-10-CM | POA: Diagnosis not present

## 2022-01-30 DIAGNOSIS — D631 Anemia in chronic kidney disease: Secondary | ICD-10-CM | POA: Diagnosis not present

## 2022-01-30 DIAGNOSIS — N250 Renal osteodystrophy: Secondary | ICD-10-CM | POA: Diagnosis not present

## 2022-01-30 DIAGNOSIS — Z992 Dependence on renal dialysis: Secondary | ICD-10-CM | POA: Diagnosis not present

## 2022-01-30 DIAGNOSIS — N186 End stage renal disease: Secondary | ICD-10-CM | POA: Diagnosis not present

## 2022-01-30 DIAGNOSIS — D509 Iron deficiency anemia, unspecified: Secondary | ICD-10-CM | POA: Diagnosis not present

## 2022-02-01 ENCOUNTER — Encounter
Admission: RE | Disposition: A | Discharge status: Home / Self Care | Payer: Self-pay | Source: Home / Self Care | Attending: Vascular Surgery

## 2022-02-01 ENCOUNTER — Ambulatory Visit
Admission: RE | Admit: 2022-02-01 | Discharge: 2022-02-01 | Disposition: A | Discharge status: Home / Self Care | Payer: Medicare Other | Attending: Vascular Surgery | Admitting: Vascular Surgery

## 2022-02-01 DIAGNOSIS — Z4901 Encounter for fitting and adjustment of extracorporeal dialysis catheter: Secondary | ICD-10-CM | POA: Insufficient documentation

## 2022-02-01 DIAGNOSIS — I120 Hypertensive chronic kidney disease with stage 5 chronic kidney disease or end stage renal disease: Secondary | ICD-10-CM | POA: Insufficient documentation

## 2022-02-01 DIAGNOSIS — Z992 Dependence on renal dialysis: Secondary | ICD-10-CM | POA: Diagnosis not present

## 2022-02-01 DIAGNOSIS — Z95828 Presence of other vascular implants and grafts: Secondary | ICD-10-CM | POA: Diagnosis not present

## 2022-02-01 DIAGNOSIS — Z87891 Personal history of nicotine dependence: Secondary | ICD-10-CM | POA: Diagnosis not present

## 2022-02-01 DIAGNOSIS — N186 End stage renal disease: Secondary | ICD-10-CM

## 2022-02-01 HISTORY — PX: DIALYSIS/PERMA CATHETER REMOVAL: CATH118289

## 2022-02-01 SURGERY — DIALYSIS/PERMA CATHETER REMOVAL
Anesthesia: LOCAL

## 2022-02-01 SURGICAL SUPPLY — 2 items
FORCEPS HALSTEAD CVD 5IN STRL (INSTRUMENTS) IMPLANT
TRAY LACERAT/PLASTIC (MISCELLANEOUS) IMPLANT
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
## 2022-02-01 NOTE — Op Note (Signed)
Operative Note     Preoperative diagnosis:   1. ESRD with functional permanent access  Postoperative diagnosis:  1. ESRD with functional permanent access  Procedure:  Removal of right jugular Permcath  Surgeon:  Leotis Pain, MD  Assistant: Annalee Genta, NP  Anesthesia:  Local  EBL:  Minimal  Indication for the Procedure:  The patient has a functional permanent dialysis access and no longer needs their permcath.  This can be removed.  Risks and benefits are discussed and informed consent is obtained.  Description of the Procedure:  The patient's right neck, chest and existing catheter were sterilely prepped and draped. The area around the catheter was anesthetized copiously with 1% lidocaine. The catheter was dissected out with curved hemostats until the cuff was freed from the surrounding fibrous sheath. The fiber sheath was transected, and the catheter was then removed in its entirety using gentle traction. Pressure was held and sterile dressings were placed. The patient tolerated the procedure well and was taken to the recovery room in stable condition.     Leotis Pain  02/01/2022, 2:35 PM This note was created with Dragon Medical transcription system. Any errors in dictation are purely unintentional.

## 2022-02-01 NOTE — Discharge Instructions (Signed)
Tunneled Catheter Removal, Care After Refer to this sheet in the next few weeks. These instructions provide you with information about caring for yourself after your procedure. Your health care provider may also give you more specific instructions. Your treatment has been planned according to current medical practices, but problems sometimes occur. Call your health care provider if you have any problems or questions after your procedure. What can I expect after the procedure? After the procedure, it is common to have: Some mild redness, swelling, and pain around your catheter site.   Follow these instructions at home: Incision care  Check your removal site  every day for signs of infection. Check for: More redness, swelling, or pain. More fluid or blood. Warmth. Pus or a bad smell. Remove your dressing in 48hrs leave open to air  Activity  Return to your normal activities as told by your health care provider. Ask your health care provider what activities are safe for you. Do not lift anything that is heavier than 10 lb (4.5 kg) for 3 days  You may shower tomorrow  Contact a health care provider if: You have more fluid or blood coming from your removal site You have more redness, swelling, or pain at your incisions or around the area where your catheter was removed Your removal site feel warm to the touch. You feel unusually weak. You feel nauseous.. Get help right away if You have swelling in your arm, shoulder, neck, or face. You develop chest pain. You have difficulty breathing. You feel dizzy or light-headed. You have pus or a bad smell coming from your removal site You have a fever. You develop bleeding from your removal site, and your bleeding does not stop. This information is not intended to replace advice given to you by your health care provider. Make sure you discuss any questions you have with your health care provider. Document Released: 02/23/2012 Document Revised:  11/09/2015 Document Reviewed: 12/02/2014 Elsevier Interactive Patient Education  2017 [employer].

## 2022-02-02 ENCOUNTER — Encounter: Payer: Self-pay | Admitting: Vascular Surgery

## 2022-02-02 DIAGNOSIS — N250 Renal osteodystrophy: Secondary | ICD-10-CM | POA: Diagnosis not present

## 2022-02-02 DIAGNOSIS — D631 Anemia in chronic kidney disease: Secondary | ICD-10-CM | POA: Diagnosis not present

## 2022-02-02 DIAGNOSIS — N2581 Secondary hyperparathyroidism of renal origin: Secondary | ICD-10-CM | POA: Diagnosis not present

## 2022-02-02 DIAGNOSIS — Z992 Dependence on renal dialysis: Secondary | ICD-10-CM | POA: Diagnosis not present

## 2022-02-02 DIAGNOSIS — N186 End stage renal disease: Secondary | ICD-10-CM | POA: Diagnosis not present

## 2022-02-02 DIAGNOSIS — Z23 Encounter for immunization: Secondary | ICD-10-CM | POA: Diagnosis not present

## 2022-02-02 DIAGNOSIS — D509 Iron deficiency anemia, unspecified: Secondary | ICD-10-CM | POA: Diagnosis not present

## 2022-02-04 DIAGNOSIS — N186 End stage renal disease: Secondary | ICD-10-CM | POA: Diagnosis not present

## 2022-02-04 DIAGNOSIS — N250 Renal osteodystrophy: Secondary | ICD-10-CM | POA: Diagnosis not present

## 2022-02-04 DIAGNOSIS — Z992 Dependence on renal dialysis: Secondary | ICD-10-CM | POA: Diagnosis not present

## 2022-02-04 DIAGNOSIS — D509 Iron deficiency anemia, unspecified: Secondary | ICD-10-CM | POA: Diagnosis not present

## 2022-02-04 DIAGNOSIS — N2581 Secondary hyperparathyroidism of renal origin: Secondary | ICD-10-CM | POA: Diagnosis not present

## 2022-02-04 DIAGNOSIS — D631 Anemia in chronic kidney disease: Secondary | ICD-10-CM | POA: Diagnosis not present

## 2022-02-06 DIAGNOSIS — N250 Renal osteodystrophy: Secondary | ICD-10-CM | POA: Diagnosis not present

## 2022-02-06 DIAGNOSIS — D509 Iron deficiency anemia, unspecified: Secondary | ICD-10-CM | POA: Diagnosis not present

## 2022-02-06 DIAGNOSIS — N186 End stage renal disease: Secondary | ICD-10-CM | POA: Diagnosis not present

## 2022-02-06 DIAGNOSIS — N2581 Secondary hyperparathyroidism of renal origin: Secondary | ICD-10-CM | POA: Diagnosis not present

## 2022-02-06 DIAGNOSIS — Z992 Dependence on renal dialysis: Secondary | ICD-10-CM | POA: Diagnosis not present

## 2022-02-06 DIAGNOSIS — D631 Anemia in chronic kidney disease: Secondary | ICD-10-CM | POA: Diagnosis not present

## 2022-02-08 DIAGNOSIS — Z992 Dependence on renal dialysis: Secondary | ICD-10-CM | POA: Diagnosis not present

## 2022-02-08 DIAGNOSIS — N250 Renal osteodystrophy: Secondary | ICD-10-CM | POA: Diagnosis not present

## 2022-02-08 DIAGNOSIS — N186 End stage renal disease: Secondary | ICD-10-CM | POA: Diagnosis not present

## 2022-02-08 DIAGNOSIS — N2581 Secondary hyperparathyroidism of renal origin: Secondary | ICD-10-CM | POA: Diagnosis not present

## 2022-02-08 DIAGNOSIS — D631 Anemia in chronic kidney disease: Secondary | ICD-10-CM | POA: Diagnosis not present

## 2022-02-08 DIAGNOSIS — D509 Iron deficiency anemia, unspecified: Secondary | ICD-10-CM | POA: Diagnosis not present

## 2022-02-10 ENCOUNTER — Ambulatory Visit: Payer: Medicare HMO | Admitting: Psychiatry

## 2022-02-10 DIAGNOSIS — N2581 Secondary hyperparathyroidism of renal origin: Secondary | ICD-10-CM | POA: Diagnosis not present

## 2022-02-10 DIAGNOSIS — N250 Renal osteodystrophy: Secondary | ICD-10-CM | POA: Diagnosis not present

## 2022-02-10 DIAGNOSIS — Z992 Dependence on renal dialysis: Secondary | ICD-10-CM | POA: Diagnosis not present

## 2022-02-10 DIAGNOSIS — N186 End stage renal disease: Secondary | ICD-10-CM | POA: Diagnosis not present

## 2022-02-10 DIAGNOSIS — D631 Anemia in chronic kidney disease: Secondary | ICD-10-CM | POA: Diagnosis not present

## 2022-02-10 DIAGNOSIS — D509 Iron deficiency anemia, unspecified: Secondary | ICD-10-CM | POA: Diagnosis not present

## 2022-02-13 DIAGNOSIS — N250 Renal osteodystrophy: Secondary | ICD-10-CM | POA: Diagnosis not present

## 2022-02-13 DIAGNOSIS — N186 End stage renal disease: Secondary | ICD-10-CM | POA: Diagnosis not present

## 2022-02-13 DIAGNOSIS — D631 Anemia in chronic kidney disease: Secondary | ICD-10-CM | POA: Diagnosis not present

## 2022-02-13 DIAGNOSIS — Z992 Dependence on renal dialysis: Secondary | ICD-10-CM | POA: Diagnosis not present

## 2022-02-13 DIAGNOSIS — D509 Iron deficiency anemia, unspecified: Secondary | ICD-10-CM | POA: Diagnosis not present

## 2022-02-13 DIAGNOSIS — N2581 Secondary hyperparathyroidism of renal origin: Secondary | ICD-10-CM | POA: Diagnosis not present

## 2022-02-15 DIAGNOSIS — H47012 Ischemic optic neuropathy, left eye: Secondary | ICD-10-CM | POA: Diagnosis not present

## 2022-02-16 DIAGNOSIS — N250 Renal osteodystrophy: Secondary | ICD-10-CM | POA: Diagnosis not present

## 2022-02-16 DIAGNOSIS — D631 Anemia in chronic kidney disease: Secondary | ICD-10-CM | POA: Diagnosis not present

## 2022-02-16 DIAGNOSIS — D509 Iron deficiency anemia, unspecified: Secondary | ICD-10-CM | POA: Diagnosis not present

## 2022-02-16 DIAGNOSIS — N2581 Secondary hyperparathyroidism of renal origin: Secondary | ICD-10-CM | POA: Diagnosis not present

## 2022-02-16 DIAGNOSIS — N186 End stage renal disease: Secondary | ICD-10-CM | POA: Diagnosis not present

## 2022-02-16 DIAGNOSIS — Z992 Dependence on renal dialysis: Secondary | ICD-10-CM | POA: Diagnosis not present

## 2022-02-18 DIAGNOSIS — D509 Iron deficiency anemia, unspecified: Secondary | ICD-10-CM | POA: Diagnosis not present

## 2022-02-18 DIAGNOSIS — N250 Renal osteodystrophy: Secondary | ICD-10-CM | POA: Diagnosis not present

## 2022-02-18 DIAGNOSIS — Z992 Dependence on renal dialysis: Secondary | ICD-10-CM | POA: Diagnosis not present

## 2022-02-18 DIAGNOSIS — D631 Anemia in chronic kidney disease: Secondary | ICD-10-CM | POA: Diagnosis not present

## 2022-02-18 DIAGNOSIS — N2581 Secondary hyperparathyroidism of renal origin: Secondary | ICD-10-CM | POA: Diagnosis not present

## 2022-02-18 DIAGNOSIS — N186 End stage renal disease: Secondary | ICD-10-CM | POA: Diagnosis not present

## 2022-02-20 DIAGNOSIS — D509 Iron deficiency anemia, unspecified: Secondary | ICD-10-CM | POA: Diagnosis not present

## 2022-02-20 DIAGNOSIS — D631 Anemia in chronic kidney disease: Secondary | ICD-10-CM | POA: Diagnosis not present

## 2022-02-20 DIAGNOSIS — N2581 Secondary hyperparathyroidism of renal origin: Secondary | ICD-10-CM | POA: Diagnosis not present

## 2022-02-20 DIAGNOSIS — Z992 Dependence on renal dialysis: Secondary | ICD-10-CM | POA: Diagnosis not present

## 2022-02-20 DIAGNOSIS — N186 End stage renal disease: Secondary | ICD-10-CM | POA: Diagnosis not present

## 2022-02-20 DIAGNOSIS — N250 Renal osteodystrophy: Secondary | ICD-10-CM | POA: Diagnosis not present

## 2022-02-23 DIAGNOSIS — D509 Iron deficiency anemia, unspecified: Secondary | ICD-10-CM | POA: Diagnosis not present

## 2022-02-23 DIAGNOSIS — D631 Anemia in chronic kidney disease: Secondary | ICD-10-CM | POA: Diagnosis not present

## 2022-02-23 DIAGNOSIS — N2581 Secondary hyperparathyroidism of renal origin: Secondary | ICD-10-CM | POA: Diagnosis not present

## 2022-02-23 DIAGNOSIS — N186 End stage renal disease: Secondary | ICD-10-CM | POA: Diagnosis not present

## 2022-02-23 DIAGNOSIS — N250 Renal osteodystrophy: Secondary | ICD-10-CM | POA: Diagnosis not present

## 2022-02-23 DIAGNOSIS — Z992 Dependence on renal dialysis: Secondary | ICD-10-CM | POA: Diagnosis not present

## 2022-02-24 ENCOUNTER — Ambulatory Visit (INDEPENDENT_AMBULATORY_CARE_PROVIDER_SITE_OTHER): Payer: Medicare Other | Admitting: Nurse Practitioner

## 2022-02-24 ENCOUNTER — Encounter: Payer: Self-pay | Admitting: Nurse Practitioner

## 2022-02-24 VITALS — BP 127/68 | HR 78 | Temp 98.4°F | Ht 66.0 in | Wt 186.6 lb

## 2022-02-24 DIAGNOSIS — N186 End stage renal disease: Secondary | ICD-10-CM | POA: Diagnosis not present

## 2022-02-24 DIAGNOSIS — F3176 Bipolar disorder, in full remission, most recent episode depressed: Secondary | ICD-10-CM

## 2022-02-24 DIAGNOSIS — E1159 Type 2 diabetes mellitus with other circulatory complications: Secondary | ICD-10-CM

## 2022-02-24 DIAGNOSIS — R7989 Other specified abnormal findings of blood chemistry: Secondary | ICD-10-CM

## 2022-02-24 DIAGNOSIS — Z992 Dependence on renal dialysis: Secondary | ICD-10-CM

## 2022-02-24 DIAGNOSIS — E782 Mixed hyperlipidemia: Secondary | ICD-10-CM | POA: Diagnosis not present

## 2022-02-24 DIAGNOSIS — I152 Hypertension secondary to endocrine disorders: Secondary | ICD-10-CM

## 2022-02-24 DIAGNOSIS — E1129 Type 2 diabetes mellitus with other diabetic kidney complication: Secondary | ICD-10-CM | POA: Diagnosis not present

## 2022-02-24 NOTE — Assessment & Plan Note (Addendum)
Chronic. Receiving Hemodialysis.  Continue to follow up with Nephrology.  Labs have been stable. Not currently on medications.  Follow up in 6 months.  Call sooner if concerns arise.

## 2022-02-24 NOTE — Assessment & Plan Note (Addendum)
Chronic.  Controlled.  Continue with current medication regimen.  Labs ordered today.  Return to clinic in 6 months for reevaluation.  Call sooner if concerns arise.  ? ?

## 2022-02-24 NOTE — Assessment & Plan Note (Signed)
Labs ordered at visit today.  Will make recommendations based on lab results.

## 2022-02-24 NOTE — Assessment & Plan Note (Signed)
Stable.  Continues to follow up with Psychiatry.  Denies concerns at visit today. Will check TSH due to medications at visit today.  Follow up in 6 months.  Call sooner if concerns arise.

## 2022-02-24 NOTE — Progress Notes (Signed)
BP 127/68   Pulse 78   Temp 98.4 F (36.9 C) (Oral)   Ht _0  (1.676 m)   Wt 186 lb 9.6 oz (84.6 kg)   SpO2 98%   BMI 30.12 kg/m    Subjective:    Patient ID: Luis Coss., male    DOB: 13-Jan-1946, 76 y.o.   MRN: [id number]  HPI: Luis Schmid. is a 76 y.o. male  Chief Complaint  Patient presents with   Diabetes    Eye exam requested from [facility]   Hyperlipidemia   Hypertension   HYPERTENSION / HYPERLIPIDEMIA Satisfied with current treatment? no Duration of hypertension: years BP monitoring frequency: not checking BP range:  BP medication side effects: no Past BP meds:  hydralazine, amlodipine, and benazepril Duration of hyperlipidemia: years Cholesterol medication side effects: no Cholesterol supplements: none Past cholesterol medications: rosuvastatin (crestor) Medication compliance: excellent compliance Aspirin: no Recent stressors: no Recurrent headaches: no Visual changes: no Palpitations: no Dyspnea: no Chest pain: no Lower extremity edema: no Dizzy/lightheaded: no  CHRONIC KIDNEY DISEASE On Hemodialysis CKD status: controlled Medications renally dose: yes Previous renal evaluation: yes Pneumovax:  Up to Date Influenza Vaccine:  Not up to Date  SLEEP APNEA Sleep apnea status: controlled Duration: chronic Satisfied with current treatment?:  yes CPAP use:  yes Using CPAP nightly for 7-8 hours.  Has improvement with his sleep .   Relevant past medical, surgical, family and social history reviewed and updated as indicated. Interim medical history since our last visit reviewed. Allergies and medications reviewed and updated.  Review of Systems  Eyes:  Negative for visual disturbance.  Respiratory:  Negative for chest tightness and shortness of breath.   Cardiovascular:  Negative for chest pain, palpitations and leg swelling.  Skin:        New dialysis catheter  Neurological:  Negative for dizziness, light-headedness and  headaches.  Psychiatric/Behavioral:  Negative for sleep disturbance.     Per HPI unless specifically indicated above     Objective:    BP 127/68   Pulse 78   Temp 98.4 F (36.9 C) (Oral)   Ht _1  (1.676 m)   Wt 186 lb 9.6 oz (84.6 kg)   SpO2 98%   BMI 30.12 kg/m   Wt Readings from Last 3 Encounters:  02/24/22 186 lb 9.6 oz (84.6 kg)  02/01/22 188 lb (85.3 kg)  12/25/21 182 lb (82.6 kg)    Physical Exam Vitals and nursing note reviewed.  Constitutional:      General: He is not in acute distress.    Appearance: Normal appearance. He is not ill-appearing, toxic-appearing or diaphoretic.  HENT:     Head: Normocephalic.     Right Ear: External ear normal.     Left Ear: External ear normal.     Nose: Nose normal. No congestion or rhinorrhea.     Mouth/Throat:     Mouth: Mucous membranes are moist.  Eyes:     General:        Right eye: No discharge.        Left eye: No discharge.     Extraocular Movements: Extraocular movements intact.     Conjunctiva/sclera: Conjunctivae normal.     Pupils: Pupils are equal, round, and reactive to light.  Cardiovascular:     Rate and Rhythm: Normal rate and regular rhythm.     Heart sounds: No murmur heard. Pulmonary:     Effort: Pulmonary effort is normal. No respiratory  distress.     Breath sounds: Normal breath sounds. No wheezing, rhonchi or rales.  Abdominal:     General: Abdomen is flat. Bowel sounds are normal.  Musculoskeletal:     Cervical back: Normal range of motion and neck supple.  Skin:    General: Skin is warm and dry.     Capillary Refill: Capillary refill takes less than 2 seconds.  Neurological:     General: No focal deficit present.     Mental Status: He is alert and oriented to person, place, and time.  Psychiatric:        Mood and Affect: Mood normal.        Behavior: Behavior normal.        Thought Content: Thought content normal.        Judgment: Judgment normal.     Results for orders placed or  performed in visit on 11/30/21  Microscopic Examination   Urine  Result Value Ref Range   WBC, UA 0-5 0 - 5 /hpf   RBC, Urine None seen 0 - 2 /hpf   Epithelial Cells (non renal) 0-10 0 - 10 /hpf   Bacteria, UA None seen None seen/Few  TSH  Result Value Ref Range   TSH 0.207 (L) 0.450 - 4.500 uIU/mL  Lipid panel  Result Value Ref Range   Cholesterol, Total 133 100 - 199 mg/dL   Triglycerides 81 0 - 149 mg/dL   HDL 46 >39 mg/dL   VLDL Cholesterol Cal 16 5 - 40 mg/dL   LDL Chol Calc (NIH) 71 0 - 99 mg/dL   Chol/HDL Ratio 2.9 0.0 - 5.0 ratio  CBC with Differential/Platelet  Result Value Ref Range   WBC 3.4 3.4 - 10.8 x10E3/uL   RBC 3.63 (L) 4.14 - 5.80 x10E6/uL   Hemoglobin 12.4 (L) 13.0 - 17.7 g/dL   Hematocrit 36.6 (L) 37.5 - 51.0 %   MCV 101 (H) 79 - 97 fL   MCH 34.2 (H) 26.6 - 33.0 pg   MCHC 33.9 31.5 - 35.7 g/dL   RDW 14.0 11.6 - 15.4 %   Platelets 119 (L) 150 - 450 x10E3/uL   Neutrophils 55 Not Estab. %   Lymphs 24 Not Estab. %   Monocytes 12 Not Estab. %   Eos 8 Not Estab. %   Basos 1 Not Estab. %   Neutrophils Absolute 1.9 1.4 - 7.0 x10E3/uL   Lymphocytes Absolute 0.8 0.7 - 3.1 x10E3/uL   Monocytes Absolute 0.4 0.1 - 0.9 x10E3/uL   EOS (ABSOLUTE) 0.3 0.0 - 0.4 x10E3/uL   Basophils Absolute 0.0 0.0 - 0.2 x10E3/uL   Immature Granulocytes 0 Not Estab. %   Immature Grans (Abs) 0.0 0.0 - 0.1 x10E3/uL  Comprehensive metabolic panel  Result Value Ref Range   Glucose 95 70 - 99 mg/dL   BUN 36 (H) 8 - 27 mg/dL   Creatinine, Ser 4.95 (H) 0.76 - 1.27 mg/dL   eGFR 11 (L) >59 mL/min/1.73   BUN/Creatinine Ratio 7 (L) 10 - 24   Sodium 148 (H) 134 - 144 mmol/L   Potassium 3.6 3.5 - 5.2 mmol/L   Chloride 103 96 - 106 mmol/L   CO2 22 20 - 29 mmol/L   Calcium 9.4 8.6 - 10.2 mg/dL   Total Protein 7.0 6.0 - 8.5 g/dL   Albumin 4.5 3.8 - 4.8 g/dL   Globulin, Total 2.5 1.5 - 4.5 g/dL   Albumin/Globulin Ratio 1.8 1.2 - 2.2   Bilirubin Total 0.4 0.0 -  1.2 mg/dL   Alkaline  Phosphatase 87 44 - 121 IU/L   AST 20 0 - 40 IU/L   ALT 17 0 - 44 IU/L  Urinalysis, Routine w reflex microscopic  Result Value Ref Range   Specific Gravity, UA 1.020 1.005 - 1.030   pH, UA 7.0 5.0 - 7.5   Color, UA Yellow Yellow   Appearance Ur Clear Clear   Leukocytes,UA Trace (A) Negative   Protein,UA 3+ (A) Negative/Trace   Glucose, UA Negative Negative   Ketones, UA Negative Negative   RBC, UA Negative Negative   Bilirubin, UA Negative Negative   Urobilinogen, Ur 0.2 0.2 - 1.0 mg/dL   Nitrite, UA Negative Negative   Microscopic Examination See below:   HgB A1c  Result Value Ref Range   Hgb A1c MFr Bld 5.4 4.8 - 5.6 %   Est. average glucose Bld gHb Est-mCnc 108 mg/dL  Measles/Mumps/Rubella Immunity  Result Value Ref Range   Rubella Antibodies, IGG 19.20 Immune >0.99 index   RUBEOLA AB, IGG >300.0 Immune >16.4 AU/mL   MUMPS ABS, IGG <9.0 (L) Immune >10.9 AU/mL  Hepatitis B surface antibody,quantitative  Result Value Ref Range   Hepatitis B Surf Ab Quant <3.1 (L) Immunity>9.9 mIU/mL      Assessment & Plan:   Problem List Items Addressed This Visit       Cardiovascular and Mediastinum   Hypertension associated with diabetes ([place]) - Primary    Chronic.  Controlled.  Continue with current medication regimen.  Labs ordered today.  Return to clinic in 6 months for reevaluation.  Call sooner if concerns arise.        Relevant Orders   Comp Met (CMET)     Endocrine   Type 2 diabetes mellitus with other diabetic kidney complication (HCC)    Chronic. Receiving Hemodialysis.  Continue to follow up with Nephrology.  Labs have been stable. Not currently on medications.  Follow up in 6 months.  Call sooner if concerns arise.        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  Controlled.  Continue with current medication regimen.  Labs ordered today.  Return to clinic in 6 months for reevaluation.  Call sooner if concerns arise.           Other   Bipolar disorder, in full remission, most recent episode depressed ([place])    Stable.  Continues to follow up with Psychiatry.  Denies concerns at visit today. Will check TSH due to medications at visit today.  Follow up in 6 months.  Call sooner if concerns arise.       Relevant Orders   TSH   T4, free   Hyperlipidemia   Relevant Orders   Lipid Profile   Abnormal TSH    Labs ordered at visit today.  Will make recommendations based on lab results.        Relevant Orders   TSH   T4, free     Follow up plan: Return in about 6 months (around 08/26/2022) for HTN, HLD, DM2 FU.

## 2022-02-25 DIAGNOSIS — D631 Anemia in chronic kidney disease: Secondary | ICD-10-CM | POA: Diagnosis not present

## 2022-02-25 DIAGNOSIS — N2581 Secondary hyperparathyroidism of renal origin: Secondary | ICD-10-CM | POA: Diagnosis not present

## 2022-02-25 DIAGNOSIS — D509 Iron deficiency anemia, unspecified: Secondary | ICD-10-CM | POA: Diagnosis not present

## 2022-02-25 DIAGNOSIS — N186 End stage renal disease: Secondary | ICD-10-CM | POA: Diagnosis not present

## 2022-02-25 DIAGNOSIS — Z992 Dependence on renal dialysis: Secondary | ICD-10-CM | POA: Diagnosis not present

## 2022-02-25 DIAGNOSIS — N250 Renal osteodystrophy: Secondary | ICD-10-CM | POA: Diagnosis not present

## 2022-02-25 LAB — COMPREHENSIVE METABOLIC PANEL
ALT: 17 IU/L (ref 0–44)
AST: 19 IU/L (ref 0–40)
Albumin/Globulin Ratio: 1.8 (ref 1.2–2.2)
Albumin: 4.4 g/dL (ref 3.8–4.8)
Alkaline Phosphatase: 98 IU/L (ref 44–121)
BUN/Creatinine Ratio: 8 — ABNORMAL LOW (ref 10–24)
BUN: 36 mg/dL — ABNORMAL HIGH (ref 8–27)
Bilirubin Total: 0.4 mg/dL (ref 0.0–1.2)
CO2: 30 mmol/L — ABNORMAL HIGH (ref 20–29)
Calcium: 9.4 mg/dL (ref 8.6–10.2)
Chloride: 100 mmol/L (ref 96–106)
Creatinine, Ser: 4.61 mg/dL — ABNORMAL HIGH (ref 0.76–1.27)
Globulin, Total: 2.4 g/dL (ref 1.5–4.5)
Glucose: 102 mg/dL — ABNORMAL HIGH (ref 70–99)
Potassium: 3.7 mmol/L (ref 3.5–5.2)
Sodium: 145 mmol/L — ABNORMAL HIGH (ref 134–144)
Total Protein: 6.8 g/dL (ref 6.0–8.5)
eGFR: 12 mL/min/{1.73_m2} — ABNORMAL LOW (ref >59)

## 2022-02-25 LAB — TSH: TSH: 0.564 u[IU]/mL (ref 0.450–4.500)

## 2022-02-25 LAB — LIPID PANEL
Chol/HDL Ratio: 3.9 ratio (ref 0.0–5.0)
Cholesterol, Total: 148 mg/dL (ref 100–199)
HDL: 38 mg/dL — ABNORMAL LOW (ref >39)
LDL Chol Calc (NIH): 78 mg/dL (ref 0–99)
Triglycerides: 189 mg/dL — ABNORMAL HIGH (ref 0–149)
VLDL Cholesterol Cal: 32 mg/dL (ref 5–40)

## 2022-02-25 LAB — T4, FREE: Free T4: 1.16 ng/dL (ref 0.82–1.77)

## 2022-02-25 NOTE — Progress Notes (Signed)
Hi Luis Miller. It was nice to see you yesterday.  Your lab work looks good.  Your kidney function remains stable.  Thyroid labs are within normal range.  No concerns at this time. Continue with your current medication regimen.  Follow up as discussed.  Please let me know if you have any questions.

## 2022-02-27 ENCOUNTER — Other Ambulatory Visit: Payer: Self-pay | Admitting: Psychiatry

## 2022-02-27 DIAGNOSIS — Z992 Dependence on renal dialysis: Secondary | ICD-10-CM | POA: Diagnosis not present

## 2022-02-27 DIAGNOSIS — D631 Anemia in chronic kidney disease: Secondary | ICD-10-CM | POA: Diagnosis not present

## 2022-02-27 DIAGNOSIS — D509 Iron deficiency anemia, unspecified: Secondary | ICD-10-CM | POA: Diagnosis not present

## 2022-02-27 DIAGNOSIS — N186 End stage renal disease: Secondary | ICD-10-CM | POA: Diagnosis not present

## 2022-02-27 DIAGNOSIS — N2581 Secondary hyperparathyroidism of renal origin: Secondary | ICD-10-CM | POA: Diagnosis not present

## 2022-02-27 DIAGNOSIS — G4701 Insomnia due to medical condition: Secondary | ICD-10-CM

## 2022-02-27 DIAGNOSIS — F3131 Bipolar disorder, current episode depressed, mild: Secondary | ICD-10-CM

## 2022-02-27 DIAGNOSIS — N250 Renal osteodystrophy: Secondary | ICD-10-CM | POA: Diagnosis not present

## 2022-03-01 ENCOUNTER — Ambulatory Visit: Payer: Medicare HMO | Admitting: Nurse Practitioner

## 2022-03-02 DIAGNOSIS — N2581 Secondary hyperparathyroidism of renal origin: Secondary | ICD-10-CM | POA: Diagnosis not present

## 2022-03-02 DIAGNOSIS — Z992 Dependence on renal dialysis: Secondary | ICD-10-CM | POA: Diagnosis not present

## 2022-03-02 DIAGNOSIS — D509 Iron deficiency anemia, unspecified: Secondary | ICD-10-CM | POA: Diagnosis not present

## 2022-03-02 DIAGNOSIS — D631 Anemia in chronic kidney disease: Secondary | ICD-10-CM | POA: Diagnosis not present

## 2022-03-02 DIAGNOSIS — N186 End stage renal disease: Secondary | ICD-10-CM | POA: Diagnosis not present

## 2022-03-02 DIAGNOSIS — N250 Renal osteodystrophy: Secondary | ICD-10-CM | POA: Diagnosis not present

## 2022-03-04 DIAGNOSIS — D631 Anemia in chronic kidney disease: Secondary | ICD-10-CM | POA: Diagnosis not present

## 2022-03-04 DIAGNOSIS — N250 Renal osteodystrophy: Secondary | ICD-10-CM | POA: Diagnosis not present

## 2022-03-04 DIAGNOSIS — N186 End stage renal disease: Secondary | ICD-10-CM | POA: Diagnosis not present

## 2022-03-04 DIAGNOSIS — D509 Iron deficiency anemia, unspecified: Secondary | ICD-10-CM | POA: Diagnosis not present

## 2022-03-04 DIAGNOSIS — Z992 Dependence on renal dialysis: Secondary | ICD-10-CM | POA: Diagnosis not present

## 2022-03-04 DIAGNOSIS — N2581 Secondary hyperparathyroidism of renal origin: Secondary | ICD-10-CM | POA: Diagnosis not present

## 2022-03-05 ENCOUNTER — Telehealth: Payer: Self-pay

## 2022-03-05 DIAGNOSIS — F3176 Bipolar disorder, in full remission, most recent episode depressed: Secondary | ICD-10-CM

## 2022-03-05 MED ORDER — FLUOXETINE HCL 10 MG PO CAPS: 10.0000 mg | ORAL_CAPSULE | Freq: Every day | ORAL | 1 refills | Status: DC

## 2022-03-05 NOTE — Telephone Encounter (Signed)
  received fax requesting a refill on the fluoxetine 27m. pt last seen on 01-25-22 next appt 07-26-22.     Disp Refills Start End   FLUoxetine (PROZAC) 10 MG capsule 90 capsule 1 09/07/2021    Sig: TAKE 1 CAPSULE DAILY   Sent to pharmacy as: FLUoxetine (PROZAC) 10 MG capsule   E-Prescribing Status: Receipt confirmed by pharmacy (09/07/2021  8:30 AM EDT)

## 2022-03-05 NOTE — Telephone Encounter (Signed)
I have sent Fluoxetine to pharmacy as requested , [facility] mail delivery.

## 2022-03-06 DIAGNOSIS — N186 End stage renal disease: Secondary | ICD-10-CM | POA: Diagnosis not present

## 2022-03-06 DIAGNOSIS — D509 Iron deficiency anemia, unspecified: Secondary | ICD-10-CM | POA: Diagnosis not present

## 2022-03-06 DIAGNOSIS — D631 Anemia in chronic kidney disease: Secondary | ICD-10-CM | POA: Diagnosis not present

## 2022-03-06 DIAGNOSIS — Z992 Dependence on renal dialysis: Secondary | ICD-10-CM | POA: Diagnosis not present

## 2022-03-06 DIAGNOSIS — N2581 Secondary hyperparathyroidism of renal origin: Secondary | ICD-10-CM | POA: Diagnosis not present

## 2022-03-06 DIAGNOSIS — N250 Renal osteodystrophy: Secondary | ICD-10-CM | POA: Diagnosis not present

## 2022-03-09 DIAGNOSIS — D631 Anemia in chronic kidney disease: Secondary | ICD-10-CM | POA: Diagnosis not present

## 2022-03-09 DIAGNOSIS — Z992 Dependence on renal dialysis: Secondary | ICD-10-CM | POA: Diagnosis not present

## 2022-03-09 DIAGNOSIS — D509 Iron deficiency anemia, unspecified: Secondary | ICD-10-CM | POA: Diagnosis not present

## 2022-03-09 DIAGNOSIS — N250 Renal osteodystrophy: Secondary | ICD-10-CM | POA: Diagnosis not present

## 2022-03-09 DIAGNOSIS — N186 End stage renal disease: Secondary | ICD-10-CM | POA: Diagnosis not present

## 2022-03-09 DIAGNOSIS — N2581 Secondary hyperparathyroidism of renal origin: Secondary | ICD-10-CM | POA: Diagnosis not present

## 2022-03-11 ENCOUNTER — Other Ambulatory Visit (INDEPENDENT_AMBULATORY_CARE_PROVIDER_SITE_OTHER): Payer: Self-pay | Admitting: Vascular Surgery

## 2022-03-11 DIAGNOSIS — N186 End stage renal disease: Secondary | ICD-10-CM

## 2022-03-11 DIAGNOSIS — D509 Iron deficiency anemia, unspecified: Secondary | ICD-10-CM | POA: Diagnosis not present

## 2022-03-11 DIAGNOSIS — D631 Anemia in chronic kidney disease: Secondary | ICD-10-CM | POA: Diagnosis not present

## 2022-03-11 DIAGNOSIS — N250 Renal osteodystrophy: Secondary | ICD-10-CM | POA: Diagnosis not present

## 2022-03-11 DIAGNOSIS — Z992 Dependence on renal dialysis: Secondary | ICD-10-CM | POA: Diagnosis not present

## 2022-03-11 DIAGNOSIS — N2581 Secondary hyperparathyroidism of renal origin: Secondary | ICD-10-CM | POA: Diagnosis not present

## 2022-03-12 ENCOUNTER — Ambulatory Visit (INDEPENDENT_AMBULATORY_CARE_PROVIDER_SITE_OTHER): Payer: Medicare Other

## 2022-03-12 ENCOUNTER — Ambulatory Visit (INDEPENDENT_AMBULATORY_CARE_PROVIDER_SITE_OTHER): Payer: Medicare Other | Admitting: Vascular Surgery

## 2022-03-12 ENCOUNTER — Encounter (INDEPENDENT_AMBULATORY_CARE_PROVIDER_SITE_OTHER): Payer: Self-pay | Admitting: Vascular Surgery

## 2022-03-12 VITALS — BP 104/63 | HR 68 | Resp 16 | Ht 66.0 in | Wt 186.0 lb

## 2022-03-12 DIAGNOSIS — I152 Hypertension secondary to endocrine disorders: Secondary | ICD-10-CM

## 2022-03-12 DIAGNOSIS — E1129 Type 2 diabetes mellitus with other diabetic kidney complication: Secondary | ICD-10-CM

## 2022-03-12 DIAGNOSIS — E1159 Type 2 diabetes mellitus with other circulatory complications: Secondary | ICD-10-CM

## 2022-03-12 DIAGNOSIS — N186 End stage renal disease: Secondary | ICD-10-CM

## 2022-03-12 DIAGNOSIS — Z992 Dependence on renal dialysis: Secondary | ICD-10-CM | POA: Diagnosis not present

## 2022-03-12 DIAGNOSIS — E782 Mixed hyperlipidemia: Secondary | ICD-10-CM | POA: Diagnosis not present

## 2022-03-12 NOTE — Assessment & Plan Note (Signed)
blood glucose control important in reducing the progression of atherosclerotic disease. Also, involved in wound healing. On appropriate medications.  
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
## 2022-03-12 NOTE — Assessment & Plan Note (Signed)
lipid control important in reducing the progression of atherosclerotic disease. Continue statin therapy  

## 2022-03-12 NOTE — Assessment & Plan Note (Signed)
Duplex today shows a stable to decreasing hematoma near the arterial anastomosis with a patent graft with no significant stenosis identified. Doing well. No changes.  Recheck in 6 months.

## 2022-03-13 DIAGNOSIS — N186 End stage renal disease: Secondary | ICD-10-CM | POA: Diagnosis not present

## 2022-03-13 DIAGNOSIS — N2581 Secondary hyperparathyroidism of renal origin: Secondary | ICD-10-CM | POA: Diagnosis not present

## 2022-03-13 DIAGNOSIS — D509 Iron deficiency anemia, unspecified: Secondary | ICD-10-CM | POA: Diagnosis not present

## 2022-03-13 DIAGNOSIS — Z992 Dependence on renal dialysis: Secondary | ICD-10-CM | POA: Diagnosis not present

## 2022-03-13 DIAGNOSIS — D631 Anemia in chronic kidney disease: Secondary | ICD-10-CM | POA: Diagnosis not present

## 2022-03-13 DIAGNOSIS — N250 Renal osteodystrophy: Secondary | ICD-10-CM | POA: Diagnosis not present

## 2022-03-16 DIAGNOSIS — N186 End stage renal disease: Secondary | ICD-10-CM | POA: Diagnosis not present

## 2022-03-16 DIAGNOSIS — D509 Iron deficiency anemia, unspecified: Secondary | ICD-10-CM | POA: Diagnosis not present

## 2022-03-16 DIAGNOSIS — N2581 Secondary hyperparathyroidism of renal origin: Secondary | ICD-10-CM | POA: Diagnosis not present

## 2022-03-16 DIAGNOSIS — D631 Anemia in chronic kidney disease: Secondary | ICD-10-CM | POA: Diagnosis not present

## 2022-03-16 DIAGNOSIS — Z992 Dependence on renal dialysis: Secondary | ICD-10-CM | POA: Diagnosis not present

## 2022-03-16 DIAGNOSIS — N250 Renal osteodystrophy: Secondary | ICD-10-CM | POA: Diagnosis not present

## 2022-03-18 DIAGNOSIS — Z992 Dependence on renal dialysis: Secondary | ICD-10-CM | POA: Diagnosis not present

## 2022-03-18 DIAGNOSIS — N2581 Secondary hyperparathyroidism of renal origin: Secondary | ICD-10-CM | POA: Diagnosis not present

## 2022-03-18 DIAGNOSIS — D631 Anemia in chronic kidney disease: Secondary | ICD-10-CM | POA: Diagnosis not present

## 2022-03-18 DIAGNOSIS — N186 End stage renal disease: Secondary | ICD-10-CM | POA: Diagnosis not present

## 2022-03-18 DIAGNOSIS — D509 Iron deficiency anemia, unspecified: Secondary | ICD-10-CM | POA: Diagnosis not present

## 2022-03-18 DIAGNOSIS — N250 Renal osteodystrophy: Secondary | ICD-10-CM | POA: Diagnosis not present

## 2022-03-20 DIAGNOSIS — Z992 Dependence on renal dialysis: Secondary | ICD-10-CM | POA: Diagnosis not present

## 2022-03-20 DIAGNOSIS — D631 Anemia in chronic kidney disease: Secondary | ICD-10-CM | POA: Diagnosis not present

## 2022-03-20 DIAGNOSIS — D509 Iron deficiency anemia, unspecified: Secondary | ICD-10-CM | POA: Diagnosis not present

## 2022-03-20 DIAGNOSIS — N250 Renal osteodystrophy: Secondary | ICD-10-CM | POA: Diagnosis not present

## 2022-03-20 DIAGNOSIS — N2581 Secondary hyperparathyroidism of renal origin: Secondary | ICD-10-CM | POA: Diagnosis not present

## 2022-03-20 DIAGNOSIS — N186 End stage renal disease: Secondary | ICD-10-CM | POA: Diagnosis not present

## 2022-03-21 DIAGNOSIS — Z992 Dependence on renal dialysis: Secondary | ICD-10-CM | POA: Diagnosis not present

## 2022-03-21 DIAGNOSIS — N186 End stage renal disease: Secondary | ICD-10-CM | POA: Diagnosis not present

## 2022-03-23 DIAGNOSIS — Z992 Dependence on renal dialysis: Secondary | ICD-10-CM | POA: Diagnosis not present

## 2022-03-23 DIAGNOSIS — N186 End stage renal disease: Secondary | ICD-10-CM | POA: Diagnosis not present

## 2022-03-23 DIAGNOSIS — N250 Renal osteodystrophy: Secondary | ICD-10-CM | POA: Diagnosis not present

## 2022-03-23 DIAGNOSIS — T82898A Other specified complication of vascular prosthetic devices, implants and grafts, initial encounter: Secondary | ICD-10-CM | POA: Diagnosis not present

## 2022-03-23 DIAGNOSIS — N2581 Secondary hyperparathyroidism of renal origin: Secondary | ICD-10-CM | POA: Diagnosis not present

## 2022-03-23 DIAGNOSIS — D509 Iron deficiency anemia, unspecified: Secondary | ICD-10-CM | POA: Diagnosis not present

## 2022-03-23 DIAGNOSIS — D631 Anemia in chronic kidney disease: Secondary | ICD-10-CM | POA: Diagnosis not present

## 2022-03-25 DIAGNOSIS — D509 Iron deficiency anemia, unspecified: Secondary | ICD-10-CM | POA: Diagnosis not present

## 2022-03-25 DIAGNOSIS — N186 End stage renal disease: Secondary | ICD-10-CM | POA: Diagnosis not present

## 2022-03-25 DIAGNOSIS — N2581 Secondary hyperparathyroidism of renal origin: Secondary | ICD-10-CM | POA: Diagnosis not present

## 2022-03-25 DIAGNOSIS — T82898A Other specified complication of vascular prosthetic devices, implants and grafts, initial encounter: Secondary | ICD-10-CM | POA: Diagnosis not present

## 2022-03-25 DIAGNOSIS — D631 Anemia in chronic kidney disease: Secondary | ICD-10-CM | POA: Diagnosis not present

## 2022-03-25 DIAGNOSIS — Z992 Dependence on renal dialysis: Secondary | ICD-10-CM | POA: Diagnosis not present

## 2022-03-27 DIAGNOSIS — D631 Anemia in chronic kidney disease: Secondary | ICD-10-CM | POA: Diagnosis not present

## 2022-03-27 DIAGNOSIS — D509 Iron deficiency anemia, unspecified: Secondary | ICD-10-CM | POA: Diagnosis not present

## 2022-03-27 DIAGNOSIS — N186 End stage renal disease: Secondary | ICD-10-CM | POA: Diagnosis not present

## 2022-03-27 DIAGNOSIS — Z992 Dependence on renal dialysis: Secondary | ICD-10-CM | POA: Diagnosis not present

## 2022-03-27 DIAGNOSIS — T82898A Other specified complication of vascular prosthetic devices, implants and grafts, initial encounter: Secondary | ICD-10-CM | POA: Diagnosis not present

## 2022-03-27 DIAGNOSIS — N2581 Secondary hyperparathyroidism of renal origin: Secondary | ICD-10-CM | POA: Diagnosis not present

## 2022-03-30 DIAGNOSIS — D631 Anemia in chronic kidney disease: Secondary | ICD-10-CM | POA: Diagnosis not present

## 2022-03-30 DIAGNOSIS — N186 End stage renal disease: Secondary | ICD-10-CM | POA: Diagnosis not present

## 2022-03-30 DIAGNOSIS — T82898A Other specified complication of vascular prosthetic devices, implants and grafts, initial encounter: Secondary | ICD-10-CM | POA: Diagnosis not present

## 2022-03-30 DIAGNOSIS — D509 Iron deficiency anemia, unspecified: Secondary | ICD-10-CM | POA: Diagnosis not present

## 2022-03-30 DIAGNOSIS — N2581 Secondary hyperparathyroidism of renal origin: Secondary | ICD-10-CM | POA: Diagnosis not present

## 2022-03-30 DIAGNOSIS — Z992 Dependence on renal dialysis: Secondary | ICD-10-CM | POA: Diagnosis not present

## 2022-03-31 DIAGNOSIS — X32XXXA Exposure to sunlight, initial encounter: Secondary | ICD-10-CM | POA: Diagnosis not present

## 2022-03-31 DIAGNOSIS — D225 Melanocytic nevi of trunk: Secondary | ICD-10-CM | POA: Diagnosis not present

## 2022-03-31 DIAGNOSIS — D485 Neoplasm of uncertain behavior of skin: Secondary | ICD-10-CM | POA: Diagnosis not present

## 2022-03-31 DIAGNOSIS — D2262 Melanocytic nevi of left upper limb, including shoulder: Secondary | ICD-10-CM | POA: Diagnosis not present

## 2022-03-31 DIAGNOSIS — D2271 Melanocytic nevi of right lower limb, including hip: Secondary | ICD-10-CM | POA: Diagnosis not present

## 2022-03-31 DIAGNOSIS — D2272 Melanocytic nevi of left lower limb, including hip: Secondary | ICD-10-CM | POA: Diagnosis not present

## 2022-03-31 DIAGNOSIS — D2261 Melanocytic nevi of right upper limb, including shoulder: Secondary | ICD-10-CM | POA: Diagnosis not present

## 2022-03-31 DIAGNOSIS — L821 Other seborrheic keratosis: Secondary | ICD-10-CM | POA: Diagnosis not present

## 2022-03-31 DIAGNOSIS — L570 Actinic keratosis: Secondary | ICD-10-CM | POA: Diagnosis not present

## 2022-04-01 DIAGNOSIS — T82898A Other specified complication of vascular prosthetic devices, implants and grafts, initial encounter: Secondary | ICD-10-CM | POA: Diagnosis not present

## 2022-04-01 DIAGNOSIS — Z992 Dependence on renal dialysis: Secondary | ICD-10-CM | POA: Diagnosis not present

## 2022-04-01 DIAGNOSIS — N2581 Secondary hyperparathyroidism of renal origin: Secondary | ICD-10-CM | POA: Diagnosis not present

## 2022-04-01 DIAGNOSIS — D631 Anemia in chronic kidney disease: Secondary | ICD-10-CM | POA: Diagnosis not present

## 2022-04-01 DIAGNOSIS — N186 End stage renal disease: Secondary | ICD-10-CM | POA: Diagnosis not present

## 2022-04-01 DIAGNOSIS — D509 Iron deficiency anemia, unspecified: Secondary | ICD-10-CM | POA: Diagnosis not present

## 2022-04-02 DIAGNOSIS — Z992 Dependence on renal dialysis: Secondary | ICD-10-CM | POA: Diagnosis not present

## 2022-04-02 DIAGNOSIS — D631 Anemia in chronic kidney disease: Secondary | ICD-10-CM | POA: Diagnosis not present

## 2022-04-02 DIAGNOSIS — N2581 Secondary hyperparathyroidism of renal origin: Secondary | ICD-10-CM | POA: Diagnosis not present

## 2022-04-02 DIAGNOSIS — T82898A Other specified complication of vascular prosthetic devices, implants and grafts, initial encounter: Secondary | ICD-10-CM | POA: Diagnosis not present

## 2022-04-02 DIAGNOSIS — N186 End stage renal disease: Secondary | ICD-10-CM | POA: Diagnosis not present

## 2022-04-02 DIAGNOSIS — D509 Iron deficiency anemia, unspecified: Secondary | ICD-10-CM | POA: Diagnosis not present

## 2022-04-03 DIAGNOSIS — N186 End stage renal disease: Secondary | ICD-10-CM | POA: Diagnosis not present

## 2022-04-03 DIAGNOSIS — T82898A Other specified complication of vascular prosthetic devices, implants and grafts, initial encounter: Secondary | ICD-10-CM | POA: Diagnosis not present

## 2022-04-03 DIAGNOSIS — Z992 Dependence on renal dialysis: Secondary | ICD-10-CM | POA: Diagnosis not present

## 2022-04-03 DIAGNOSIS — D509 Iron deficiency anemia, unspecified: Secondary | ICD-10-CM | POA: Diagnosis not present

## 2022-04-03 DIAGNOSIS — D631 Anemia in chronic kidney disease: Secondary | ICD-10-CM | POA: Diagnosis not present

## 2022-04-03 DIAGNOSIS — N2581 Secondary hyperparathyroidism of renal origin: Secondary | ICD-10-CM | POA: Diagnosis not present

## 2022-04-06 DIAGNOSIS — D631 Anemia in chronic kidney disease: Secondary | ICD-10-CM | POA: Diagnosis not present

## 2022-04-06 DIAGNOSIS — T82898A Other specified complication of vascular prosthetic devices, implants and grafts, initial encounter: Secondary | ICD-10-CM | POA: Diagnosis not present

## 2022-04-06 DIAGNOSIS — Z992 Dependence on renal dialysis: Secondary | ICD-10-CM | POA: Diagnosis not present

## 2022-04-06 DIAGNOSIS — D509 Iron deficiency anemia, unspecified: Secondary | ICD-10-CM | POA: Diagnosis not present

## 2022-04-06 DIAGNOSIS — N186 End stage renal disease: Secondary | ICD-10-CM | POA: Diagnosis not present

## 2022-04-06 DIAGNOSIS — N2581 Secondary hyperparathyroidism of renal origin: Secondary | ICD-10-CM | POA: Diagnosis not present

## 2022-04-08 DIAGNOSIS — D509 Iron deficiency anemia, unspecified: Secondary | ICD-10-CM | POA: Diagnosis not present

## 2022-04-08 DIAGNOSIS — N2581 Secondary hyperparathyroidism of renal origin: Secondary | ICD-10-CM | POA: Diagnosis not present

## 2022-04-08 DIAGNOSIS — T82898A Other specified complication of vascular prosthetic devices, implants and grafts, initial encounter: Secondary | ICD-10-CM | POA: Diagnosis not present

## 2022-04-08 DIAGNOSIS — D631 Anemia in chronic kidney disease: Secondary | ICD-10-CM | POA: Diagnosis not present

## 2022-04-08 DIAGNOSIS — Z992 Dependence on renal dialysis: Secondary | ICD-10-CM | POA: Diagnosis not present

## 2022-04-08 DIAGNOSIS — N186 End stage renal disease: Secondary | ICD-10-CM | POA: Diagnosis not present

## 2022-04-10 DIAGNOSIS — N2581 Secondary hyperparathyroidism of renal origin: Secondary | ICD-10-CM | POA: Diagnosis not present

## 2022-04-10 DIAGNOSIS — N186 End stage renal disease: Secondary | ICD-10-CM | POA: Diagnosis not present

## 2022-04-10 DIAGNOSIS — Z992 Dependence on renal dialysis: Secondary | ICD-10-CM | POA: Diagnosis not present

## 2022-04-10 DIAGNOSIS — T82898A Other specified complication of vascular prosthetic devices, implants and grafts, initial encounter: Secondary | ICD-10-CM | POA: Diagnosis not present

## 2022-04-10 DIAGNOSIS — D509 Iron deficiency anemia, unspecified: Secondary | ICD-10-CM | POA: Diagnosis not present

## 2022-04-10 DIAGNOSIS — D631 Anemia in chronic kidney disease: Secondary | ICD-10-CM | POA: Diagnosis not present

## 2022-04-13 DIAGNOSIS — Z992 Dependence on renal dialysis: Secondary | ICD-10-CM | POA: Diagnosis not present

## 2022-04-13 DIAGNOSIS — D509 Iron deficiency anemia, unspecified: Secondary | ICD-10-CM | POA: Diagnosis not present

## 2022-04-13 DIAGNOSIS — D631 Anemia in chronic kidney disease: Secondary | ICD-10-CM | POA: Diagnosis not present

## 2022-04-13 DIAGNOSIS — N186 End stage renal disease: Secondary | ICD-10-CM | POA: Diagnosis not present

## 2022-04-13 DIAGNOSIS — T82898A Other specified complication of vascular prosthetic devices, implants and grafts, initial encounter: Secondary | ICD-10-CM | POA: Diagnosis not present

## 2022-04-13 DIAGNOSIS — N2581 Secondary hyperparathyroidism of renal origin: Secondary | ICD-10-CM | POA: Diagnosis not present

## 2022-04-15 DIAGNOSIS — N186 End stage renal disease: Secondary | ICD-10-CM | POA: Diagnosis not present

## 2022-04-15 DIAGNOSIS — T82898A Other specified complication of vascular prosthetic devices, implants and grafts, initial encounter: Secondary | ICD-10-CM | POA: Diagnosis not present

## 2022-04-15 DIAGNOSIS — D631 Anemia in chronic kidney disease: Secondary | ICD-10-CM | POA: Diagnosis not present

## 2022-04-15 DIAGNOSIS — D509 Iron deficiency anemia, unspecified: Secondary | ICD-10-CM | POA: Diagnosis not present

## 2022-04-15 DIAGNOSIS — Z992 Dependence on renal dialysis: Secondary | ICD-10-CM | POA: Diagnosis not present

## 2022-04-15 DIAGNOSIS — N2581 Secondary hyperparathyroidism of renal origin: Secondary | ICD-10-CM | POA: Diagnosis not present

## 2022-04-17 DIAGNOSIS — N186 End stage renal disease: Secondary | ICD-10-CM | POA: Diagnosis not present

## 2022-04-17 DIAGNOSIS — N2581 Secondary hyperparathyroidism of renal origin: Secondary | ICD-10-CM | POA: Diagnosis not present

## 2022-04-17 DIAGNOSIS — D509 Iron deficiency anemia, unspecified: Secondary | ICD-10-CM | POA: Diagnosis not present

## 2022-04-17 DIAGNOSIS — D631 Anemia in chronic kidney disease: Secondary | ICD-10-CM | POA: Diagnosis not present

## 2022-04-17 DIAGNOSIS — T82898A Other specified complication of vascular prosthetic devices, implants and grafts, initial encounter: Secondary | ICD-10-CM | POA: Diagnosis not present

## 2022-04-17 DIAGNOSIS — Z992 Dependence on renal dialysis: Secondary | ICD-10-CM | POA: Diagnosis not present

## 2022-04-20 DIAGNOSIS — T82898A Other specified complication of vascular prosthetic devices, implants and grafts, initial encounter: Secondary | ICD-10-CM | POA: Diagnosis not present

## 2022-04-20 DIAGNOSIS — Z992 Dependence on renal dialysis: Secondary | ICD-10-CM | POA: Diagnosis not present

## 2022-04-20 DIAGNOSIS — N2581 Secondary hyperparathyroidism of renal origin: Secondary | ICD-10-CM | POA: Diagnosis not present

## 2022-04-20 DIAGNOSIS — N186 End stage renal disease: Secondary | ICD-10-CM | POA: Diagnosis not present

## 2022-04-20 DIAGNOSIS — D631 Anemia in chronic kidney disease: Secondary | ICD-10-CM | POA: Diagnosis not present

## 2022-04-20 DIAGNOSIS — D509 Iron deficiency anemia, unspecified: Secondary | ICD-10-CM | POA: Diagnosis not present

## 2022-04-21 DIAGNOSIS — N186 End stage renal disease: Secondary | ICD-10-CM | POA: Diagnosis not present

## 2022-04-21 DIAGNOSIS — Z992 Dependence on renal dialysis: Secondary | ICD-10-CM | POA: Diagnosis not present

## 2022-04-22 DIAGNOSIS — D509 Iron deficiency anemia, unspecified: Secondary | ICD-10-CM | POA: Diagnosis not present

## 2022-04-22 DIAGNOSIS — N186 End stage renal disease: Secondary | ICD-10-CM | POA: Diagnosis not present

## 2022-04-22 DIAGNOSIS — Z992 Dependence on renal dialysis: Secondary | ICD-10-CM | POA: Diagnosis not present

## 2022-04-22 DIAGNOSIS — N250 Renal osteodystrophy: Secondary | ICD-10-CM | POA: Diagnosis not present

## 2022-04-22 DIAGNOSIS — D631 Anemia in chronic kidney disease: Secondary | ICD-10-CM | POA: Diagnosis not present

## 2022-04-24 DIAGNOSIS — Z992 Dependence on renal dialysis: Secondary | ICD-10-CM | POA: Diagnosis not present

## 2022-04-24 DIAGNOSIS — N186 End stage renal disease: Secondary | ICD-10-CM | POA: Diagnosis not present

## 2022-04-24 DIAGNOSIS — N250 Renal osteodystrophy: Secondary | ICD-10-CM | POA: Diagnosis not present

## 2022-04-24 DIAGNOSIS — D631 Anemia in chronic kidney disease: Secondary | ICD-10-CM | POA: Diagnosis not present

## 2022-04-24 DIAGNOSIS — D509 Iron deficiency anemia, unspecified: Secondary | ICD-10-CM | POA: Diagnosis not present

## 2022-04-26 ENCOUNTER — Telehealth: Payer: Self-pay

## 2022-04-26 NOTE — Telephone Encounter (Signed)
Medication management - Telephone call with patient, after he left a message he was in need of a new Lamotrigine order. Patient stated he was now getting medications from [facility] and had his last refill from [facility] changed to them. Patient agreed to call our office back if any issues changing pharmacies and of need of any new orders.

## 2022-04-27 DIAGNOSIS — D509 Iron deficiency anemia, unspecified: Secondary | ICD-10-CM | POA: Diagnosis not present

## 2022-04-27 DIAGNOSIS — N250 Renal osteodystrophy: Secondary | ICD-10-CM | POA: Diagnosis not present

## 2022-04-27 DIAGNOSIS — Z992 Dependence on renal dialysis: Secondary | ICD-10-CM | POA: Diagnosis not present

## 2022-04-27 DIAGNOSIS — D631 Anemia in chronic kidney disease: Secondary | ICD-10-CM | POA: Diagnosis not present

## 2022-04-27 DIAGNOSIS — N186 End stage renal disease: Secondary | ICD-10-CM | POA: Diagnosis not present

## 2022-04-29 DIAGNOSIS — D631 Anemia in chronic kidney disease: Secondary | ICD-10-CM | POA: Diagnosis not present

## 2022-04-29 DIAGNOSIS — D509 Iron deficiency anemia, unspecified: Secondary | ICD-10-CM | POA: Diagnosis not present

## 2022-04-29 DIAGNOSIS — N186 End stage renal disease: Secondary | ICD-10-CM | POA: Diagnosis not present

## 2022-04-29 DIAGNOSIS — N250 Renal osteodystrophy: Secondary | ICD-10-CM | POA: Diagnosis not present

## 2022-04-29 DIAGNOSIS — Z992 Dependence on renal dialysis: Secondary | ICD-10-CM | POA: Diagnosis not present

## 2022-05-01 DIAGNOSIS — D509 Iron deficiency anemia, unspecified: Secondary | ICD-10-CM | POA: Diagnosis not present

## 2022-05-01 DIAGNOSIS — D631 Anemia in chronic kidney disease: Secondary | ICD-10-CM | POA: Diagnosis not present

## 2022-05-01 DIAGNOSIS — Z992 Dependence on renal dialysis: Secondary | ICD-10-CM | POA: Diagnosis not present

## 2022-05-01 DIAGNOSIS — N186 End stage renal disease: Secondary | ICD-10-CM | POA: Diagnosis not present

## 2022-05-01 DIAGNOSIS — N250 Renal osteodystrophy: Secondary | ICD-10-CM | POA: Diagnosis not present

## 2022-05-04 DIAGNOSIS — D631 Anemia in chronic kidney disease: Secondary | ICD-10-CM | POA: Diagnosis not present

## 2022-05-04 DIAGNOSIS — Z992 Dependence on renal dialysis: Secondary | ICD-10-CM | POA: Diagnosis not present

## 2022-05-04 DIAGNOSIS — D509 Iron deficiency anemia, unspecified: Secondary | ICD-10-CM | POA: Diagnosis not present

## 2022-05-04 DIAGNOSIS — N186 End stage renal disease: Secondary | ICD-10-CM | POA: Diagnosis not present

## 2022-05-04 DIAGNOSIS — N250 Renal osteodystrophy: Secondary | ICD-10-CM | POA: Diagnosis not present

## 2022-05-06 DIAGNOSIS — N186 End stage renal disease: Secondary | ICD-10-CM | POA: Diagnosis not present

## 2022-05-06 DIAGNOSIS — D631 Anemia in chronic kidney disease: Secondary | ICD-10-CM | POA: Diagnosis not present

## 2022-05-06 DIAGNOSIS — D509 Iron deficiency anemia, unspecified: Secondary | ICD-10-CM | POA: Diagnosis not present

## 2022-05-06 DIAGNOSIS — N250 Renal osteodystrophy: Secondary | ICD-10-CM | POA: Diagnosis not present

## 2022-05-06 DIAGNOSIS — Z992 Dependence on renal dialysis: Secondary | ICD-10-CM | POA: Diagnosis not present

## 2022-05-08 DIAGNOSIS — N250 Renal osteodystrophy: Secondary | ICD-10-CM | POA: Diagnosis not present

## 2022-05-08 DIAGNOSIS — D631 Anemia in chronic kidney disease: Secondary | ICD-10-CM | POA: Diagnosis not present

## 2022-05-08 DIAGNOSIS — D509 Iron deficiency anemia, unspecified: Secondary | ICD-10-CM | POA: Diagnosis not present

## 2022-05-08 DIAGNOSIS — Z992 Dependence on renal dialysis: Secondary | ICD-10-CM | POA: Diagnosis not present

## 2022-05-08 DIAGNOSIS — N186 End stage renal disease: Secondary | ICD-10-CM | POA: Diagnosis not present

## 2022-05-11 DIAGNOSIS — Z992 Dependence on renal dialysis: Secondary | ICD-10-CM | POA: Diagnosis not present

## 2022-05-11 DIAGNOSIS — D631 Anemia in chronic kidney disease: Secondary | ICD-10-CM | POA: Diagnosis not present

## 2022-05-11 DIAGNOSIS — N186 End stage renal disease: Secondary | ICD-10-CM | POA: Diagnosis not present

## 2022-05-11 DIAGNOSIS — N250 Renal osteodystrophy: Secondary | ICD-10-CM | POA: Diagnosis not present

## 2022-05-11 DIAGNOSIS — D509 Iron deficiency anemia, unspecified: Secondary | ICD-10-CM | POA: Diagnosis not present
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
## 2022-05-12 ENCOUNTER — Encounter: Payer: Self-pay | Admitting: Urology

## 2022-05-12 ENCOUNTER — Ambulatory Visit (INDEPENDENT_AMBULATORY_CARE_PROVIDER_SITE_OTHER): Payer: Medicare Other | Admitting: Urology

## 2022-05-12 VITALS — BP 131/73 | HR 88 | Ht 66.0 in | Wt 185.0 lb

## 2022-05-12 DIAGNOSIS — N529 Male erectile dysfunction, unspecified: Secondary | ICD-10-CM | POA: Diagnosis not present

## 2022-05-12 DIAGNOSIS — R319 Hematuria, unspecified: Secondary | ICD-10-CM

## 2022-05-12 DIAGNOSIS — R972 Elevated prostate specific antigen [PSA]: Secondary | ICD-10-CM | POA: Diagnosis not present

## 2022-05-12 DIAGNOSIS — N401 Enlarged prostate with lower urinary tract symptoms: Secondary | ICD-10-CM

## 2022-05-12 DIAGNOSIS — N138 Other obstructive and reflux uropathy: Secondary | ICD-10-CM

## 2022-05-12 LAB — URINALYSIS, COMPLETE
Bilirubin, UA: NEGATIVE
Glucose, UA: NEGATIVE
Ketones, UA: NEGATIVE
Nitrite, UA: NEGATIVE
RBC, UA: NEGATIVE
Specific Gravity, UA: 1.015 (ref 1.005–1.030)
Urobilinogen, Ur: 0.2 mg/dL (ref 0.2–1.0)
pH, UA: 7.5 (ref 5.0–7.5)

## 2022-05-12 LAB — MICROSCOPIC EXAMINATION

## 2022-05-13 DIAGNOSIS — N250 Renal osteodystrophy: Secondary | ICD-10-CM | POA: Diagnosis not present

## 2022-05-13 DIAGNOSIS — D631 Anemia in chronic kidney disease: Secondary | ICD-10-CM | POA: Diagnosis not present

## 2022-05-13 DIAGNOSIS — N186 End stage renal disease: Secondary | ICD-10-CM | POA: Diagnosis not present

## 2022-05-13 DIAGNOSIS — Z992 Dependence on renal dialysis: Secondary | ICD-10-CM | POA: Diagnosis not present

## 2022-05-13 DIAGNOSIS — D509 Iron deficiency anemia, unspecified: Secondary | ICD-10-CM | POA: Diagnosis not present

## 2022-05-15 DIAGNOSIS — Z992 Dependence on renal dialysis: Secondary | ICD-10-CM | POA: Diagnosis not present

## 2022-05-15 DIAGNOSIS — D631 Anemia in chronic kidney disease: Secondary | ICD-10-CM | POA: Diagnosis not present

## 2022-05-15 DIAGNOSIS — D509 Iron deficiency anemia, unspecified: Secondary | ICD-10-CM | POA: Diagnosis not present

## 2022-05-15 DIAGNOSIS — N250 Renal osteodystrophy: Secondary | ICD-10-CM | POA: Diagnosis not present

## 2022-05-15 DIAGNOSIS — N186 End stage renal disease: Secondary | ICD-10-CM | POA: Diagnosis not present

## 2022-05-17 ENCOUNTER — Other Ambulatory Visit: Payer: Self-pay | Admitting: Nurse Practitioner

## 2022-05-17 DIAGNOSIS — E782 Mixed hyperlipidemia: Secondary | ICD-10-CM

## 2022-05-17 NOTE — Telephone Encounter (Unsigned)
Medication Refill - Medication: Crestor generic 10 mg  x 90 days  Has the patient contacted their pharmacy? No. (Agent: If no, request that the patient contact the pharmacy for the refill. If patient does not wish to contact the pharmacy document the reason why and proceed with request.) (Agent: If yes, when and what did the pharmacy advise?)  Preferred Pharmacy (with phone number or street name): [facility]   Has the patient been seen for an appointment in the last year OR does the patient have an upcoming appointment? {yes [id number]  Agent: Please be advised that RX refills may take up to 3 business days. We ask that you follow-up with your pharmacy.

## 2022-05-18 ENCOUNTER — Other Ambulatory Visit: Payer: Self-pay

## 2022-05-18 DIAGNOSIS — D631 Anemia in chronic kidney disease: Secondary | ICD-10-CM | POA: Diagnosis not present

## 2022-05-18 DIAGNOSIS — D509 Iron deficiency anemia, unspecified: Secondary | ICD-10-CM | POA: Diagnosis not present

## 2022-05-18 DIAGNOSIS — N250 Renal osteodystrophy: Secondary | ICD-10-CM | POA: Diagnosis not present

## 2022-05-18 DIAGNOSIS — Z992 Dependence on renal dialysis: Secondary | ICD-10-CM | POA: Diagnosis not present

## 2022-05-18 DIAGNOSIS — N186 End stage renal disease: Secondary | ICD-10-CM | POA: Diagnosis not present

## 2022-05-18 NOTE — Telephone Encounter (Signed)
Error

## 2022-05-20 DIAGNOSIS — N250 Renal osteodystrophy: Secondary | ICD-10-CM | POA: Diagnosis not present

## 2022-05-20 DIAGNOSIS — Z992 Dependence on renal dialysis: Secondary | ICD-10-CM | POA: Diagnosis not present

## 2022-05-20 DIAGNOSIS — D631 Anemia in chronic kidney disease: Secondary | ICD-10-CM | POA: Diagnosis not present

## 2022-05-20 DIAGNOSIS — N186 End stage renal disease: Secondary | ICD-10-CM | POA: Diagnosis not present

## 2022-05-20 DIAGNOSIS — D509 Iron deficiency anemia, unspecified: Secondary | ICD-10-CM | POA: Diagnosis not present

## 2022-05-20 MED ORDER — ROSUVASTATIN CALCIUM 10 MG PO TABS: 10.0000 mg | ORAL_TABLET | Freq: Every day | ORAL | 1 refills | Status: DC

## 2022-05-20 NOTE — Telephone Encounter (Signed)
Requested Prescriptions  Pending Prescriptions Disp Refills   rosuvastatin (CRESTOR) 10 MG tablet 90 tablet 1    Sig: Take 1 tablet (10 mg total) by mouth daily.     Cardiovascular:  Antilipid - Statins 2 Failed - 05/20/2022  5:29 PM      Failed - Cr in normal range and within 360 days    Creatinine  Date Value Ref Range Status  03/08/2014 2.22 (H) 0.60 - 1.30 mg/dL Final   Creatinine, Ser  Date Value Ref Range Status  02/24/2022 4.61 (H) 0.76 - 1.27 mg/dL Final   Creatinine, Urine  Date Value Ref Range Status  08/01/2019 194 mg/dL Final    Comment:    Performed at [facility], [address] [place], [place] [address]         Failed - Lipid Panel in normal range within the last 12 months    Cholesterol, Total  Date Value Ref Range Status  02/24/2022 148 100 - 199 mg/dL Final   Cholesterol Piccolo, Waived  Date Value Ref Range Status  10/03/2017 180 <200 mg/dL Final    Comment:                            Desirable                <200                         Borderline High      200- 239                         High                     >239    LDL Chol Calc (NIH)  Date Value Ref Range Status  02/24/2022 78 0 - 99 mg/dL Final   HDL  Date Value Ref Range Status  02/24/2022 38 (L) >39 mg/dL Final   Triglycerides  Date Value Ref Range Status  02/24/2022 189 (H) 0 - 149 mg/dL Final   Triglycerides Piccolo,Waived  Date Value Ref Range Status  10/03/2017 252 (H) <150 mg/dL Final    Comment:                            Normal                   <150                         Borderline High     150 - 199                         High                200 - 499                         Very High                >499          Passed - Patient is not pregnant      Passed - Valid encounter within last 12 months    Recent Outpatient Visits  2 months ago Hypertension associated with diabetes [facility])   [place], NP   5 months ago Annual physical exam   [place] Jon Billings, NP   8 months ago Cellulitis of left lower leg   [place], [place] T, NP   8 months ago Cellulitis of left lower leg   [place], Henrine Screws T, NP   8 months ago Essential hypertension   [place] Jon Billings, NP       Future Appointments             In 3 months Jon Billings, NP [place], [place]   In 4 months McGowan, Gordan Payment [place]

## 2022-05-20 NOTE — Telephone Encounter (Signed)
The patient called back in checking on the status of his refill on his rosuvastatin (CRESTOR) 10 MG tablet  through his mail order pharmacy    [facility] [place] [place] [facility] [place] Phone: [phone]  Fax: [phone]     Please assist patient further

## 2022-05-21 ENCOUNTER — Ambulatory Visit: Payer: Self-pay

## 2022-05-21 MED ORDER — ROSUVASTATIN CALCIUM 10 MG PO TABS: 10.0000 mg | ORAL_TABLET | Freq: Every day | ORAL | 1 refills | Status: DC

## 2022-05-21 NOTE — Telephone Encounter (Signed)
Pt is calling to check on the status of his medication refill. Pt states that he will run out of medication on Sunday. Pt was advised that request came in yesterday. And medication refills can take up to 3 business days.

## 2022-05-21 NOTE — Telephone Encounter (Signed)
  Chief Complaint: medication assistance Symptoms: NA Frequency: today Pertinent Negatives: NA Disposition: '[]'$ ED /'[]'$ Urgent Care (no appt availability in office) / '[]'$ Appointment(In office/virtual)/ '[]'$  [place] [facility]/ '[]'$ Home Care/ '[]'$ Refused Recommended Disposition /'[]'$ [place] [facility]/ '[]'$  Follow-up with PCP Additional Notes: pt was asking if he misses a few days dose d/t mail order pharmacy delivering would that be ok. I advised him that I can send short supply to local pharmacy but pt refused, also resent to [place] since previous was sent to [place] and pt states he doesn't use them anymore. No further assistance was needed. \  Summary: Medication Advice   Pt is calling to check on the status of his medication refill on rosuvastatin (CRESTOR). Pt would like to know what rosuvastatin (CRESTOR) is for?         Reason for Disposition  [1] Prescription prescribed recently is not at pharmacy AND [2] triager has access to patient's EMR AND [3] prescription is recorded in the EMR  Answer Assessment - Initial Assessment Questions 1. NAME of MEDICINE: "What medicine(s) are you calling about?"     crestor 2. QUESTION: "What is your question?" (e.g., double dose of medicine, side effect)     Wanting know what it was for and if missed a few days d/t mail order pharmacy delivering 3. PRESCRIBER: "Who prescribed the medicine?" Reason: if prescribed by specialist, call should be referred to that group.     Santiago Glad, NP  Protocols used: Medication Question Call-A-AH

## 2022-05-22 DIAGNOSIS — D631 Anemia in chronic kidney disease: Secondary | ICD-10-CM | POA: Diagnosis not present

## 2022-05-22 DIAGNOSIS — Z992 Dependence on renal dialysis: Secondary | ICD-10-CM | POA: Diagnosis not present

## 2022-05-22 DIAGNOSIS — N186 End stage renal disease: Secondary | ICD-10-CM | POA: Diagnosis not present

## 2022-05-22 DIAGNOSIS — N250 Renal osteodystrophy: Secondary | ICD-10-CM | POA: Diagnosis not present

## 2022-05-22 DIAGNOSIS — D509 Iron deficiency anemia, unspecified: Secondary | ICD-10-CM | POA: Diagnosis not present

## 2022-05-25 DIAGNOSIS — D509 Iron deficiency anemia, unspecified: Secondary | ICD-10-CM | POA: Diagnosis not present

## 2022-05-25 DIAGNOSIS — N186 End stage renal disease: Secondary | ICD-10-CM | POA: Diagnosis not present

## 2022-05-25 DIAGNOSIS — D631 Anemia in chronic kidney disease: Secondary | ICD-10-CM | POA: Diagnosis not present

## 2022-05-25 DIAGNOSIS — Z992 Dependence on renal dialysis: Secondary | ICD-10-CM | POA: Diagnosis not present

## 2022-05-25 DIAGNOSIS — N250 Renal osteodystrophy: Secondary | ICD-10-CM | POA: Diagnosis not present

## 2022-05-27 DIAGNOSIS — D631 Anemia in chronic kidney disease: Secondary | ICD-10-CM | POA: Diagnosis not present

## 2022-05-27 DIAGNOSIS — N186 End stage renal disease: Secondary | ICD-10-CM | POA: Diagnosis not present

## 2022-05-27 DIAGNOSIS — D509 Iron deficiency anemia, unspecified: Secondary | ICD-10-CM | POA: Diagnosis not present

## 2022-05-27 DIAGNOSIS — N250 Renal osteodystrophy: Secondary | ICD-10-CM | POA: Diagnosis not present

## 2022-05-27 DIAGNOSIS — Z992 Dependence on renal dialysis: Secondary | ICD-10-CM | POA: Diagnosis not present

## 2022-05-28 ENCOUNTER — Ambulatory Visit: Payer: Self-pay | Admitting: *Deleted

## 2022-05-28 DIAGNOSIS — E782 Mixed hyperlipidemia: Secondary | ICD-10-CM

## 2022-05-28 MED ORDER — ROSUVASTATIN CALCIUM 10 MG PO TABS: 10.0000 mg | ORAL_TABLET | Freq: Every day | ORAL | 0 refills | Status: DC

## 2022-05-28 MED ORDER — ROSUVASTATIN CALCIUM 10 MG PO TABS: 10.0000 mg | ORAL_TABLET | Freq: Every day | ORAL | 1 refills | Status: DC

## 2022-05-28 NOTE — Telephone Encounter (Signed)
  Chief Complaint: Patient having trouble getting Rx  Disposition: [] ED /[] Urgent Care (no appt availability in office) / [] Appointment(In office/virtual)/ []  [place] [facility]/ [x] Home Care/ [] Refused Recommended Disposition /[]  Mobile Bus/ []  Follow-up with PCP Additional Notes: verified correct pharmacy with phone number given by patient- [facility] was [facility]- problem corrected- see notes

## 2022-05-28 NOTE — Telephone Encounter (Signed)
Summary: Missing medication, seeking nurse assistance   Pt called reporting that he is without his rosuvastatin. Says he spoke to the mail order pharmacy and they do not have his order even though we have receipt confirming that they received it 05/21/2022. He is [facility] [place] even though the notes from his last triage encounter say that he declined that offer. He says that he has been waiting for it for over a week.  He is requesting a call back from a nurse today so he does not go the entire weekend without his medication  [phone]    Call to patient- confirmed Pharmacy is [place]- call to pharmacy- they are not mail order- will verify pharmacy location. Patient calls [phone]- [facility] sent per patient request so he will have supply.  [facility] #90 1RF Reason for Disposition  [1] Prescription prescribed recently is not at pharmacy AND [2] triager has access to patient's EMR AND [3] prescription is recorded in the EMR  Answer Assessment - Initial Assessment Questions 1. DRUG NAME: "What medicine do you need to have refilled?"     Rosuvastatin  2. REFILLS REMAINING: "How many refills are remaining?" (Note: The label on the medicine or pill bottle will show how many refills are remaining. If there are no refills remaining, then a renewal may be needed.)     #90 1RF sent to mail order- but not received  3. EXPIRATION DATE: "What is the expiration date?" (Note: The label states when the prescription will expire, and thus can no longer be refilled.)     na 4. PRESCRIBING HCP: "Who prescribed it?" Reason: If prescribed by specialist, call should be referred to that group.     PCP  Rx sent to wrong pharmacy- [facility]- [facility]  Protocols used: Medication Refill and Renewal Call-A-AH

## 2022-05-29 DIAGNOSIS — N186 End stage renal disease: Secondary | ICD-10-CM | POA: Diagnosis not present

## 2022-05-29 DIAGNOSIS — D631 Anemia in chronic kidney disease: Secondary | ICD-10-CM | POA: Diagnosis not present

## 2022-05-29 DIAGNOSIS — Z992 Dependence on renal dialysis: Secondary | ICD-10-CM | POA: Diagnosis not present

## 2022-05-29 DIAGNOSIS — N250 Renal osteodystrophy: Secondary | ICD-10-CM | POA: Diagnosis not present

## 2022-05-29 DIAGNOSIS — D509 Iron deficiency anemia, unspecified: Secondary | ICD-10-CM | POA: Diagnosis not present

## 2022-05-31 DIAGNOSIS — E0590 Thyrotoxicosis, unspecified without thyrotoxic crisis or storm: Secondary | ICD-10-CM | POA: Diagnosis not present

## 2022-06-01 DIAGNOSIS — Z992 Dependence on renal dialysis: Secondary | ICD-10-CM | POA: Diagnosis not present

## 2022-06-01 DIAGNOSIS — D631 Anemia in chronic kidney disease: Secondary | ICD-10-CM | POA: Diagnosis not present

## 2022-06-01 DIAGNOSIS — N186 End stage renal disease: Secondary | ICD-10-CM | POA: Diagnosis not present

## 2022-06-01 DIAGNOSIS — D509 Iron deficiency anemia, unspecified: Secondary | ICD-10-CM | POA: Diagnosis not present

## 2022-06-01 DIAGNOSIS — N250 Renal osteodystrophy: Secondary | ICD-10-CM | POA: Diagnosis not present

## 2022-06-02 ENCOUNTER — Telehealth: Payer: Self-pay | Admitting: Nurse Practitioner

## 2022-06-02 NOTE — Telephone Encounter (Signed)
Pt came by the office to see if his insurance was correct in the system and due to a bill that he had received and it had [employer] on it and wanted to see why that was on his bill.  Pt was assisted and he is aware that we have Medicare (Red,White and Blue) A&B and Supplement insurance [facility]) Medicare.  Assisted the pt by calling to make sure that the billing office has his correct insuranc on file per the pts request.   Called and spoke with Agent Alexis in billing [phone]) and she stated that she will send it for review so the reviewing team will be able to assist from this point.  Pt was called to let him know of the findings and made him aware to call his insurance company to see if the vaccines were covered.  He stated that he would do that.  Nothing further is needed from the office.

## 2022-06-03 DIAGNOSIS — D631 Anemia in chronic kidney disease: Secondary | ICD-10-CM | POA: Diagnosis not present

## 2022-06-03 DIAGNOSIS — D509 Iron deficiency anemia, unspecified: Secondary | ICD-10-CM | POA: Diagnosis not present

## 2022-06-03 DIAGNOSIS — N186 End stage renal disease: Secondary | ICD-10-CM | POA: Diagnosis not present

## 2022-06-03 DIAGNOSIS — Z992 Dependence on renal dialysis: Secondary | ICD-10-CM | POA: Diagnosis not present

## 2022-06-03 DIAGNOSIS — N250 Renal osteodystrophy: Secondary | ICD-10-CM | POA: Diagnosis not present

## 2022-06-05 DIAGNOSIS — N186 End stage renal disease: Secondary | ICD-10-CM | POA: Diagnosis not present

## 2022-06-05 DIAGNOSIS — N250 Renal osteodystrophy: Secondary | ICD-10-CM | POA: Diagnosis not present

## 2022-06-05 DIAGNOSIS — D509 Iron deficiency anemia, unspecified: Secondary | ICD-10-CM | POA: Diagnosis not present

## 2022-06-05 DIAGNOSIS — Z992 Dependence on renal dialysis: Secondary | ICD-10-CM | POA: Diagnosis not present

## 2022-06-05 DIAGNOSIS — D631 Anemia in chronic kidney disease: Secondary | ICD-10-CM | POA: Diagnosis not present

## 2022-06-08 DIAGNOSIS — N250 Renal osteodystrophy: Secondary | ICD-10-CM | POA: Diagnosis not present

## 2022-06-08 DIAGNOSIS — D631 Anemia in chronic kidney disease: Secondary | ICD-10-CM | POA: Diagnosis not present

## 2022-06-08 DIAGNOSIS — Z992 Dependence on renal dialysis: Secondary | ICD-10-CM | POA: Diagnosis not present

## 2022-06-08 DIAGNOSIS — N186 End stage renal disease: Secondary | ICD-10-CM | POA: Diagnosis not present

## 2022-06-08 DIAGNOSIS — D509 Iron deficiency anemia, unspecified: Secondary | ICD-10-CM | POA: Diagnosis not present

## 2022-06-10 DIAGNOSIS — N186 End stage renal disease: Secondary | ICD-10-CM | POA: Diagnosis not present

## 2022-06-10 DIAGNOSIS — D631 Anemia in chronic kidney disease: Secondary | ICD-10-CM | POA: Diagnosis not present

## 2022-06-10 DIAGNOSIS — D509 Iron deficiency anemia, unspecified: Secondary | ICD-10-CM | POA: Diagnosis not present

## 2022-06-10 DIAGNOSIS — Z992 Dependence on renal dialysis: Secondary | ICD-10-CM | POA: Diagnosis not present

## 2022-06-10 DIAGNOSIS — N250 Renal osteodystrophy: Secondary | ICD-10-CM | POA: Diagnosis not present

## 2022-06-12 DIAGNOSIS — N186 End stage renal disease: Secondary | ICD-10-CM | POA: Diagnosis not present

## 2022-06-12 DIAGNOSIS — D509 Iron deficiency anemia, unspecified: Secondary | ICD-10-CM | POA: Diagnosis not present

## 2022-06-12 DIAGNOSIS — N250 Renal osteodystrophy: Secondary | ICD-10-CM | POA: Diagnosis not present

## 2022-06-12 DIAGNOSIS — D631 Anemia in chronic kidney disease: Secondary | ICD-10-CM | POA: Diagnosis not present

## 2022-06-12 DIAGNOSIS — Z992 Dependence on renal dialysis: Secondary | ICD-10-CM | POA: Diagnosis not present

## 2022-06-14 DIAGNOSIS — E0590 Thyrotoxicosis, unspecified without thyrotoxic crisis or storm: Secondary | ICD-10-CM | POA: Diagnosis not present

## 2022-06-15 DIAGNOSIS — N250 Renal osteodystrophy: Secondary | ICD-10-CM | POA: Diagnosis not present

## 2022-06-15 DIAGNOSIS — D631 Anemia in chronic kidney disease: Secondary | ICD-10-CM | POA: Diagnosis not present

## 2022-06-15 DIAGNOSIS — N186 End stage renal disease: Secondary | ICD-10-CM | POA: Diagnosis not present

## 2022-06-15 DIAGNOSIS — Z992 Dependence on renal dialysis: Secondary | ICD-10-CM | POA: Diagnosis not present

## 2022-06-15 DIAGNOSIS — D509 Iron deficiency anemia, unspecified: Secondary | ICD-10-CM | POA: Diagnosis not present

## 2022-06-17 DIAGNOSIS — D509 Iron deficiency anemia, unspecified: Secondary | ICD-10-CM | POA: Diagnosis not present

## 2022-06-17 DIAGNOSIS — Z992 Dependence on renal dialysis: Secondary | ICD-10-CM | POA: Diagnosis not present

## 2022-06-17 DIAGNOSIS — N250 Renal osteodystrophy: Secondary | ICD-10-CM | POA: Diagnosis not present

## 2022-06-17 DIAGNOSIS — D631 Anemia in chronic kidney disease: Secondary | ICD-10-CM | POA: Diagnosis not present

## 2022-06-17 DIAGNOSIS — N186 End stage renal disease: Secondary | ICD-10-CM | POA: Diagnosis not present

## 2022-06-19 DIAGNOSIS — D631 Anemia in chronic kidney disease: Secondary | ICD-10-CM | POA: Diagnosis not present

## 2022-06-19 DIAGNOSIS — N250 Renal osteodystrophy: Secondary | ICD-10-CM | POA: Diagnosis not present

## 2022-06-19 DIAGNOSIS — Z992 Dependence on renal dialysis: Secondary | ICD-10-CM | POA: Diagnosis not present

## 2022-06-19 DIAGNOSIS — N186 End stage renal disease: Secondary | ICD-10-CM | POA: Diagnosis not present

## 2022-06-19 DIAGNOSIS — D509 Iron deficiency anemia, unspecified: Secondary | ICD-10-CM | POA: Diagnosis not present

## 2022-06-20 DIAGNOSIS — N186 End stage renal disease: Secondary | ICD-10-CM | POA: Diagnosis not present

## 2022-06-20 DIAGNOSIS — Z992 Dependence on renal dialysis: Secondary | ICD-10-CM | POA: Diagnosis not present

## 2022-06-22 DIAGNOSIS — Z992 Dependence on renal dialysis: Secondary | ICD-10-CM | POA: Diagnosis not present

## 2022-06-22 DIAGNOSIS — D509 Iron deficiency anemia, unspecified: Secondary | ICD-10-CM | POA: Diagnosis not present

## 2022-06-22 DIAGNOSIS — D631 Anemia in chronic kidney disease: Secondary | ICD-10-CM | POA: Diagnosis not present

## 2022-06-22 DIAGNOSIS — N250 Renal osteodystrophy: Secondary | ICD-10-CM | POA: Diagnosis not present

## 2022-06-22 DIAGNOSIS — N186 End stage renal disease: Secondary | ICD-10-CM | POA: Diagnosis not present

## 2022-06-24 DIAGNOSIS — D509 Iron deficiency anemia, unspecified: Secondary | ICD-10-CM | POA: Diagnosis not present

## 2022-06-24 DIAGNOSIS — N250 Renal osteodystrophy: Secondary | ICD-10-CM | POA: Diagnosis not present

## 2022-06-24 DIAGNOSIS — N186 End stage renal disease: Secondary | ICD-10-CM | POA: Diagnosis not present

## 2022-06-24 DIAGNOSIS — D631 Anemia in chronic kidney disease: Secondary | ICD-10-CM | POA: Diagnosis not present

## 2022-06-24 DIAGNOSIS — Z992 Dependence on renal dialysis: Secondary | ICD-10-CM | POA: Diagnosis not present

## 2022-06-26 DIAGNOSIS — Z992 Dependence on renal dialysis: Secondary | ICD-10-CM | POA: Diagnosis not present

## 2022-06-26 DIAGNOSIS — N186 End stage renal disease: Secondary | ICD-10-CM | POA: Diagnosis not present

## 2022-06-26 DIAGNOSIS — N250 Renal osteodystrophy: Secondary | ICD-10-CM | POA: Diagnosis not present

## 2022-06-26 DIAGNOSIS — D631 Anemia in chronic kidney disease: Secondary | ICD-10-CM | POA: Diagnosis not present

## 2022-06-26 DIAGNOSIS — D509 Iron deficiency anemia, unspecified: Secondary | ICD-10-CM | POA: Diagnosis not present

## 2022-06-29 DIAGNOSIS — D509 Iron deficiency anemia, unspecified: Secondary | ICD-10-CM | POA: Diagnosis not present

## 2022-06-29 DIAGNOSIS — Z992 Dependence on renal dialysis: Secondary | ICD-10-CM | POA: Diagnosis not present

## 2022-06-29 DIAGNOSIS — N186 End stage renal disease: Secondary | ICD-10-CM | POA: Diagnosis not present

## 2022-06-29 DIAGNOSIS — N250 Renal osteodystrophy: Secondary | ICD-10-CM | POA: Diagnosis not present

## 2022-06-29 DIAGNOSIS — D631 Anemia in chronic kidney disease: Secondary | ICD-10-CM | POA: Diagnosis not present

## 2022-07-01 DIAGNOSIS — D509 Iron deficiency anemia, unspecified: Secondary | ICD-10-CM | POA: Diagnosis not present

## 2022-07-01 DIAGNOSIS — N250 Renal osteodystrophy: Secondary | ICD-10-CM | POA: Diagnosis not present

## 2022-07-01 DIAGNOSIS — N186 End stage renal disease: Secondary | ICD-10-CM | POA: Diagnosis not present

## 2022-07-01 DIAGNOSIS — D631 Anemia in chronic kidney disease: Secondary | ICD-10-CM | POA: Diagnosis not present

## 2022-07-01 DIAGNOSIS — Z992 Dependence on renal dialysis: Secondary | ICD-10-CM | POA: Diagnosis not present

## 2022-07-03 DIAGNOSIS — Z992 Dependence on renal dialysis: Secondary | ICD-10-CM | POA: Diagnosis not present

## 2022-07-03 DIAGNOSIS — N186 End stage renal disease: Secondary | ICD-10-CM | POA: Diagnosis not present

## 2022-07-03 DIAGNOSIS — D631 Anemia in chronic kidney disease: Secondary | ICD-10-CM | POA: Diagnosis not present

## 2022-07-03 DIAGNOSIS — N250 Renal osteodystrophy: Secondary | ICD-10-CM | POA: Diagnosis not present

## 2022-07-03 DIAGNOSIS — D509 Iron deficiency anemia, unspecified: Secondary | ICD-10-CM | POA: Diagnosis not present

## 2022-07-04 ENCOUNTER — Other Ambulatory Visit: Payer: Self-pay | Admitting: Nurse Practitioner

## 2022-07-05 NOTE — Telephone Encounter (Signed)
Requested Prescriptions  Pending Prescriptions Disp Refills   loratadine (CLARITIN) 10 MG tablet [Pharmacy Med Name: LORATADINE 10 MG TABLET] 90 tablet 1    Sig: TAKE 1 TABLET BY MOUTH EVERY DAY     Ear, Nose, and Throat:  Antihistamines 2 Failed - 07/04/2022  8:56 AM      Failed - Cr in normal range and within 360 days    Creatinine  Date Value Ref Range Status  03/08/2014 2.22 (H) 0.60 - 1.30 mg/dL Final   Creatinine, Ser  Date Value Ref Range Status  02/24/2022 4.61 (H) 0.76 - 1.27 mg/dL Final   Creatinine, Urine  Date Value Ref Range Status  08/01/2019 194 mg/dL Final    Comment:    Performed at [facility], [address], [place], [place] [address]         Passed - Valid encounter within last 12 months    Recent Outpatient Visits           4 months ago Hypertension associated with diabetes [facility])   [place] [facility] Larae Grooms, NP   7 months ago Annual physical exam   [place] [facility] Larae Grooms, NP   9 months ago Cellulitis of left lower leg   [place] [facility] [place], [place] T, NP   10 months ago Cellulitis of left lower leg   [place] [facility] [place], Corrie Dandy T, NP   10 months ago Essential hypertension   [place] [facility] Larae Grooms, NP       Future Appointments             In 1 month Larae Grooms, NP  [facility]   In 3 months [place], Elana Alm [facility] Urology [facility]

## 2022-07-06 DIAGNOSIS — D509 Iron deficiency anemia, unspecified: Secondary | ICD-10-CM | POA: Diagnosis not present

## 2022-07-06 DIAGNOSIS — D631 Anemia in chronic kidney disease: Secondary | ICD-10-CM | POA: Diagnosis not present

## 2022-07-06 DIAGNOSIS — Z992 Dependence on renal dialysis: Secondary | ICD-10-CM | POA: Diagnosis not present

## 2022-07-06 DIAGNOSIS — N250 Renal osteodystrophy: Secondary | ICD-10-CM | POA: Diagnosis not present

## 2022-07-06 DIAGNOSIS — N186 End stage renal disease: Secondary | ICD-10-CM | POA: Diagnosis not present

## 2022-07-08 DIAGNOSIS — D631 Anemia in chronic kidney disease: Secondary | ICD-10-CM | POA: Diagnosis not present

## 2022-07-08 DIAGNOSIS — N250 Renal osteodystrophy: Secondary | ICD-10-CM | POA: Diagnosis not present

## 2022-07-08 DIAGNOSIS — Z992 Dependence on renal dialysis: Secondary | ICD-10-CM | POA: Diagnosis not present

## 2022-07-08 DIAGNOSIS — D509 Iron deficiency anemia, unspecified: Secondary | ICD-10-CM | POA: Diagnosis not present

## 2022-07-08 DIAGNOSIS — N186 End stage renal disease: Secondary | ICD-10-CM | POA: Diagnosis not present

## 2022-07-10 ENCOUNTER — Other Ambulatory Visit: Payer: Self-pay | Admitting: Psychiatry

## 2022-07-10 DIAGNOSIS — F3131 Bipolar disorder, current episode depressed, mild: Secondary | ICD-10-CM

## 2022-07-10 DIAGNOSIS — D509 Iron deficiency anemia, unspecified: Secondary | ICD-10-CM | POA: Diagnosis not present

## 2022-07-10 DIAGNOSIS — N250 Renal osteodystrophy: Secondary | ICD-10-CM | POA: Diagnosis not present

## 2022-07-10 DIAGNOSIS — Z992 Dependence on renal dialysis: Secondary | ICD-10-CM | POA: Diagnosis not present

## 2022-07-10 DIAGNOSIS — D631 Anemia in chronic kidney disease: Secondary | ICD-10-CM | POA: Diagnosis not present

## 2022-07-10 DIAGNOSIS — G4701 Insomnia due to medical condition: Secondary | ICD-10-CM

## 2022-07-10 DIAGNOSIS — N186 End stage renal disease: Secondary | ICD-10-CM | POA: Diagnosis not present

## 2022-07-12 ENCOUNTER — Telehealth: Payer: Self-pay | Admitting: Psychiatry

## 2022-07-12 ENCOUNTER — Telehealth: Payer: Self-pay

## 2022-07-12 DIAGNOSIS — F3131 Bipolar disorder, current episode depressed, mild: Secondary | ICD-10-CM

## 2022-07-12 DIAGNOSIS — F3176 Bipolar disorder, in full remission, most recent episode depressed: Secondary | ICD-10-CM

## 2022-07-12 DIAGNOSIS — G4701 Insomnia due to medical condition: Secondary | ICD-10-CM

## 2022-07-12 MED ORDER — OLANZAPINE 5 MG PO TABS: 5.0000 mg | ORAL_TABLET | Freq: Every day | ORAL | 1 refills | Status: DC

## 2022-07-12 NOTE — Telephone Encounter (Signed)
Contacted patient to discuss.  Discussed to try local pharmacy and use GoodRx.  Patient to come and pick up GoodRx coupon.  Printed out and given to front desk.

## 2022-07-12 NOTE — Telephone Encounter (Signed)
Patient picked up GoodRx. I have sent olanzapine 5 mg to [facility] [employer].

## 2022-07-12 NOTE — Telephone Encounter (Signed)
Patient called requesting that Dr Elna Breslow switch his Olanzapine due to the cost he would like to go back to the Trazodone please advise.  Ph[facility] - [place], [place] - [address] Phone: [phone]  Fax: [phone]

## 2022-07-13 DIAGNOSIS — N186 End stage renal disease: Secondary | ICD-10-CM | POA: Diagnosis not present

## 2022-07-13 DIAGNOSIS — Z992 Dependence on renal dialysis: Secondary | ICD-10-CM | POA: Diagnosis not present

## 2022-07-13 DIAGNOSIS — D509 Iron deficiency anemia, unspecified: Secondary | ICD-10-CM | POA: Diagnosis not present

## 2022-07-13 DIAGNOSIS — N250 Renal osteodystrophy: Secondary | ICD-10-CM | POA: Diagnosis not present

## 2022-07-13 DIAGNOSIS — D631 Anemia in chronic kidney disease: Secondary | ICD-10-CM | POA: Diagnosis not present

## 2022-07-15 DIAGNOSIS — D631 Anemia in chronic kidney disease: Secondary | ICD-10-CM | POA: Diagnosis not present

## 2022-07-15 DIAGNOSIS — D509 Iron deficiency anemia, unspecified: Secondary | ICD-10-CM | POA: Diagnosis not present

## 2022-07-15 DIAGNOSIS — N186 End stage renal disease: Secondary | ICD-10-CM | POA: Diagnosis not present

## 2022-07-15 DIAGNOSIS — Z992 Dependence on renal dialysis: Secondary | ICD-10-CM | POA: Diagnosis not present

## 2022-07-15 DIAGNOSIS — N250 Renal osteodystrophy: Secondary | ICD-10-CM | POA: Diagnosis not present

## 2022-07-17 ENCOUNTER — Other Ambulatory Visit: Payer: Self-pay | Admitting: Nurse Practitioner

## 2022-07-17 DIAGNOSIS — E782 Mixed hyperlipidemia: Secondary | ICD-10-CM

## 2022-07-17 DIAGNOSIS — D631 Anemia in chronic kidney disease: Secondary | ICD-10-CM | POA: Diagnosis not present

## 2022-07-17 DIAGNOSIS — D509 Iron deficiency anemia, unspecified: Secondary | ICD-10-CM | POA: Diagnosis not present

## 2022-07-17 DIAGNOSIS — Z992 Dependence on renal dialysis: Secondary | ICD-10-CM | POA: Diagnosis not present

## 2022-07-17 DIAGNOSIS — N250 Renal osteodystrophy: Secondary | ICD-10-CM | POA: Diagnosis not present

## 2022-07-17 DIAGNOSIS — N186 End stage renal disease: Secondary | ICD-10-CM | POA: Diagnosis not present

## 2022-07-19 NOTE — Telephone Encounter (Signed)
Requested Prescriptions  Pending Prescriptions Disp Refills   rosuvastatin (CRESTOR) 10 MG tablet [Pharmacy Med Name: ROSUVASTATIN CALCIUM 10 MG TAB] 90 tablet 0    Sig: TAKE 1 TABLET BY MOUTH EVERY DAY     Cardiovascular:  Antilipid - Statins 2 Failed - 07/17/2022  8:27 AM      Failed - Cr in normal range and within 360 days    Creatinine  Date Value Ref Range Status  03/08/2014 2.22 (H) 0.60 - 1.30 mg/dL Final   Creatinine, Ser  Date Value Ref Range Status  02/24/2022 4.61 (H) 0.76 - 1.27 mg/dL Final   Creatinine, Urine  Date Value Ref Range Status  08/01/2019 194 mg/dL Final    Comment:    Performed at [facility], [address] [place], [place] [address]         Failed - Lipid Panel in normal range within the last 12 months    Cholesterol, Total  Date Value Ref Range Status  02/24/2022 148 100 - 199 mg/dL Final   Cholesterol Piccolo, Waived  Date Value Ref Range Status  10/03/2017 180 <200 mg/dL Final    Comment:                            Desirable                <200                         Borderline High      200- 239                         High                     >239    LDL Chol Calc (NIH)  Date Value Ref Range Status  02/24/2022 78 0 - 99 mg/dL Final   HDL  Date Value Ref Range Status  02/24/2022 38 (L) >39 mg/dL Final   Triglycerides  Date Value Ref Range Status  02/24/2022 189 (H) 0 - 149 mg/dL Final   Triglycerides Piccolo,Waived  Date Value Ref Range Status  10/03/2017 252 (H) <150 mg/dL Final    Comment:                            Normal                   <150                         Borderline High     150 - 199                         High                200 - 499                         Very High                >499          Passed - Patient is not pregnant      Passed - Valid encounter within last 12 months    Recent Outpatient Visits  4 months ago Hypertension associated with diabetes [facility])   [place]  [facility] Larae Grooms, NP   7 months ago Annual physical exam   [place] [facility] Larae Grooms, NP   10 months ago Cellulitis of left lower leg   [place] [facility] [place], Alix T, NP   10 months ago Cellulitis of left lower leg   [place] [facility], Corrie Dandy T, NP   10 months ago Essential hypertension   [place] [facility] Larae Grooms, NP       Future Appointments             In 1 month Larae Grooms, NP [place] [facility]   In 2 months [place], Elana Alm [facility]

## 2022-07-20 DIAGNOSIS — N250 Renal osteodystrophy: Secondary | ICD-10-CM | POA: Diagnosis not present

## 2022-07-20 DIAGNOSIS — Z992 Dependence on renal dialysis: Secondary | ICD-10-CM | POA: Diagnosis not present

## 2022-07-20 DIAGNOSIS — D509 Iron deficiency anemia, unspecified: Secondary | ICD-10-CM | POA: Diagnosis not present

## 2022-07-20 DIAGNOSIS — D631 Anemia in chronic kidney disease: Secondary | ICD-10-CM | POA: Diagnosis not present

## 2022-07-20 DIAGNOSIS — N186 End stage renal disease: Secondary | ICD-10-CM | POA: Diagnosis not present

## 2022-07-22 DIAGNOSIS — Z992 Dependence on renal dialysis: Secondary | ICD-10-CM | POA: Diagnosis not present

## 2022-07-22 DIAGNOSIS — N186 End stage renal disease: Secondary | ICD-10-CM | POA: Diagnosis not present

## 2022-07-22 DIAGNOSIS — D509 Iron deficiency anemia, unspecified: Secondary | ICD-10-CM | POA: Diagnosis not present

## 2022-07-24 DIAGNOSIS — D509 Iron deficiency anemia, unspecified: Secondary | ICD-10-CM | POA: Diagnosis not present

## 2022-07-24 DIAGNOSIS — N186 End stage renal disease: Secondary | ICD-10-CM | POA: Diagnosis not present

## 2022-07-24 DIAGNOSIS — Z992 Dependence on renal dialysis: Secondary | ICD-10-CM | POA: Diagnosis not present

## 2022-07-26 ENCOUNTER — Encounter: Payer: Self-pay | Admitting: Psychiatry

## 2022-07-26 ENCOUNTER — Ambulatory Visit (INDEPENDENT_AMBULATORY_CARE_PROVIDER_SITE_OTHER): Payer: Medicare Other | Admitting: Psychiatry

## 2022-07-26 VITALS — BP 136/74 | HR 90 | Temp 98.5°F | Ht 66.0 in | Wt 192.6 lb

## 2022-07-26 DIAGNOSIS — F419 Anxiety disorder, unspecified: Secondary | ICD-10-CM

## 2022-07-26 DIAGNOSIS — G4701 Insomnia due to medical condition: Secondary | ICD-10-CM | POA: Diagnosis not present

## 2022-07-26 DIAGNOSIS — F3131 Bipolar disorder, current episode depressed, mild: Secondary | ICD-10-CM | POA: Diagnosis not present

## 2022-07-26 MED ORDER — OLANZAPINE 5 MG PO TABS: 5.0000 mg | ORAL_TABLET | Freq: Every day | ORAL | 0 refills | Status: DC

## 2022-07-26 MED ORDER — FLUOXETINE HCL 20 MG PO CAPS: 20.0000 mg | ORAL_CAPSULE | Freq: Every day | ORAL | 0 refills | Status: DC

## 2022-07-26 NOTE — Patient Instructions (Signed)
   [employer] [email or web address] [address], [place], [place] [address]   [phone]     [employer] - [phone]
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
## 2022-07-27 DIAGNOSIS — D509 Iron deficiency anemia, unspecified: Secondary | ICD-10-CM | POA: Diagnosis not present

## 2022-07-27 DIAGNOSIS — N186 End stage renal disease: Secondary | ICD-10-CM | POA: Diagnosis not present

## 2022-07-27 DIAGNOSIS — Z992 Dependence on renal dialysis: Secondary | ICD-10-CM | POA: Diagnosis not present

## 2022-07-29 DIAGNOSIS — N186 End stage renal disease: Secondary | ICD-10-CM | POA: Diagnosis not present

## 2022-07-29 DIAGNOSIS — D509 Iron deficiency anemia, unspecified: Secondary | ICD-10-CM | POA: Diagnosis not present

## 2022-07-29 DIAGNOSIS — Z992 Dependence on renal dialysis: Secondary | ICD-10-CM | POA: Diagnosis not present

## 2022-07-30 ENCOUNTER — Other Ambulatory Visit: Payer: Self-pay

## 2022-07-30 ENCOUNTER — Telehealth: Payer: Self-pay | Admitting: Psychiatry

## 2022-07-30 ENCOUNTER — Encounter: Payer: Self-pay | Admitting: Psychiatry

## 2022-07-30 ENCOUNTER — Inpatient Hospital Stay
Admission: AD | Admit: 2022-07-30 | Discharge: 2022-09-26 | DRG: 876 | Disposition: A | Discharge status: Other Acute Inpatient Hospital | Payer: Medicare Other | Source: Intra-hospital | Attending: Psychiatry | Admitting: Psychiatry

## 2022-07-30 ENCOUNTER — Emergency Department (EMERGENCY_DEPARTMENT_HOSPITAL)
Admission: EM | Admit: 2022-07-30 | Discharge: 2022-07-30 | Disposition: A | Discharge status: Other Acute Inpatient Hospital | Payer: Medicare Other | Source: Home / Self Care | Attending: Emergency Medicine | Admitting: Emergency Medicine

## 2022-07-30 ENCOUNTER — Telehealth: Payer: Self-pay

## 2022-07-30 DIAGNOSIS — Z9841 Cataract extraction status, right eye: Secondary | ICD-10-CM

## 2022-07-30 DIAGNOSIS — Z818 Family history of other mental and behavioral disorders: Secondary | ICD-10-CM | POA: Diagnosis not present

## 2022-07-30 DIAGNOSIS — I700 Atherosclerosis of aorta: Secondary | ICD-10-CM | POA: Diagnosis not present

## 2022-07-30 DIAGNOSIS — G4700 Insomnia, unspecified: Secondary | ICD-10-CM | POA: Diagnosis not present

## 2022-07-30 DIAGNOSIS — N186 End stage renal disease: Secondary | ICD-10-CM

## 2022-07-30 DIAGNOSIS — E232 Diabetes insipidus: Secondary | ICD-10-CM | POA: Insufficient documentation

## 2022-07-30 DIAGNOSIS — Z992 Dependence on renal dialysis: Secondary | ICD-10-CM | POA: Insufficient documentation

## 2022-07-30 DIAGNOSIS — Z961 Presence of intraocular lens: Secondary | ICD-10-CM | POA: Diagnosis present

## 2022-07-30 DIAGNOSIS — E1122 Type 2 diabetes mellitus with diabetic chronic kidney disease: Secondary | ICD-10-CM | POA: Diagnosis not present

## 2022-07-30 DIAGNOSIS — Z9852 Vasectomy status: Secondary | ICD-10-CM

## 2022-07-30 DIAGNOSIS — Z974 Presence of external hearing-aid: Secondary | ICD-10-CM

## 2022-07-30 DIAGNOSIS — Z888 Allergy status to other drugs, medicaments and biological substances status: Secondary | ICD-10-CM | POA: Diagnosis not present

## 2022-07-30 DIAGNOSIS — F341 Dysthymic disorder: Secondary | ICD-10-CM | POA: Diagnosis present

## 2022-07-30 DIAGNOSIS — Z96652 Presence of left artificial knee joint: Secondary | ICD-10-CM | POA: Diagnosis not present

## 2022-07-30 DIAGNOSIS — F319 Bipolar disorder, unspecified: Principal | ICD-10-CM | POA: Diagnosis present

## 2022-07-30 DIAGNOSIS — Z8616 Personal history of COVID-19: Secondary | ICD-10-CM | POA: Diagnosis not present

## 2022-07-30 DIAGNOSIS — Z85828 Personal history of other malignant neoplasm of skin: Secondary | ICD-10-CM

## 2022-07-30 DIAGNOSIS — Z87891 Personal history of nicotine dependence: Secondary | ICD-10-CM

## 2022-07-30 DIAGNOSIS — Y712 Prosthetic and other implants, materials and accessory cardiovascular devices associated with adverse incidents: Secondary | ICD-10-CM | POA: Diagnosis not present

## 2022-07-30 DIAGNOSIS — R45851 Suicidal ideations: Secondary | ICD-10-CM | POA: Diagnosis present

## 2022-07-30 DIAGNOSIS — T82590A Other mechanical complication of surgically created arteriovenous fistula, initial encounter: Secondary | ICD-10-CM

## 2022-07-30 DIAGNOSIS — D631 Anemia in chronic kidney disease: Secondary | ICD-10-CM | POA: Diagnosis present

## 2022-07-30 DIAGNOSIS — K5090 Crohn's disease, unspecified, without complications: Secondary | ICD-10-CM | POA: Diagnosis present

## 2022-07-30 DIAGNOSIS — I959 Hypotension, unspecified: Secondary | ICD-10-CM | POA: Diagnosis not present

## 2022-07-30 DIAGNOSIS — F609 Personality disorder, unspecified: Secondary | ICD-10-CM | POA: Diagnosis present

## 2022-07-30 DIAGNOSIS — F32A Depression, unspecified: Secondary | ICD-10-CM

## 2022-07-30 DIAGNOSIS — T82858A Stenosis of vascular prosthetic devices, implants and grafts, initial encounter: Secondary | ICD-10-CM | POA: Diagnosis not present

## 2022-07-30 DIAGNOSIS — Z886 Allergy status to analgesic agent status: Secondary | ICD-10-CM

## 2022-07-30 DIAGNOSIS — R6510 Systemic inflammatory response syndrome (SIRS) of non-infectious origin without acute organ dysfunction: Secondary | ICD-10-CM

## 2022-07-30 DIAGNOSIS — Z79899 Other long term (current) drug therapy: Secondary | ICD-10-CM | POA: Diagnosis not present

## 2022-07-30 DIAGNOSIS — E785 Hyperlipidemia, unspecified: Secondary | ICD-10-CM | POA: Diagnosis present

## 2022-07-30 DIAGNOSIS — F3130 Bipolar disorder, current episode depressed, mild or moderate severity, unspecified: Secondary | ICD-10-CM | POA: Insufficient documentation

## 2022-07-30 DIAGNOSIS — I120 Hypertensive chronic kidney disease with stage 5 chronic kidney disease or end stage renal disease: Secondary | ICD-10-CM | POA: Diagnosis present

## 2022-07-30 DIAGNOSIS — Z043 Encounter for examination and observation following other accident: Secondary | ICD-10-CM | POA: Diagnosis not present

## 2022-07-30 DIAGNOSIS — N529 Male erectile dysfunction, unspecified: Secondary | ICD-10-CM | POA: Diagnosis present

## 2022-07-30 DIAGNOSIS — W06XXXA Fall from bed, initial encounter: Secondary | ICD-10-CM | POA: Diagnosis present

## 2022-07-30 DIAGNOSIS — N2581 Secondary hyperparathyroidism of renal origin: Secondary | ICD-10-CM | POA: Diagnosis present

## 2022-07-30 DIAGNOSIS — M109 Gout, unspecified: Secondary | ICD-10-CM | POA: Diagnosis present

## 2022-07-30 DIAGNOSIS — Y92003 Bedroom of unspecified non-institutional (private) residence as the place of occurrence of the external cause: Secondary | ICD-10-CM | POA: Diagnosis not present

## 2022-07-30 DIAGNOSIS — K5900 Constipation, unspecified: Secondary | ICD-10-CM | POA: Diagnosis not present

## 2022-07-30 DIAGNOSIS — Z8673 Personal history of transient ischemic attack (TIA), and cerebral infarction without residual deficits: Secondary | ICD-10-CM | POA: Diagnosis not present

## 2022-07-30 DIAGNOSIS — G4730 Sleep apnea, unspecified: Secondary | ICD-10-CM | POA: Diagnosis present

## 2022-07-30 DIAGNOSIS — N189 Chronic kidney disease, unspecified: Secondary | ICD-10-CM | POA: Diagnosis not present

## 2022-07-30 DIAGNOSIS — Z5989 Other problems related to housing and economic circumstances: Secondary | ICD-10-CM

## 2022-07-30 DIAGNOSIS — F419 Anxiety disorder, unspecified: Secondary | ICD-10-CM | POA: Diagnosis not present

## 2022-07-30 DIAGNOSIS — T82511A Breakdown (mechanical) of surgically created arteriovenous shunt, initial encounter: Secondary | ICD-10-CM | POA: Diagnosis not present

## 2022-07-30 DIAGNOSIS — M1711 Unilateral primary osteoarthritis, right knee: Secondary | ICD-10-CM | POA: Diagnosis not present

## 2022-07-30 DIAGNOSIS — A419 Sepsis, unspecified organism: Secondary | ICD-10-CM | POA: Diagnosis not present

## 2022-07-30 DIAGNOSIS — M25461 Effusion, right knee: Secondary | ICD-10-CM | POA: Diagnosis not present

## 2022-07-30 DIAGNOSIS — Z85820 Personal history of malignant melanoma of skin: Secondary | ICD-10-CM | POA: Diagnosis not present

## 2022-07-30 DIAGNOSIS — I10 Essential (primary) hypertension: Secondary | ICD-10-CM | POA: Diagnosis not present

## 2022-07-30 DIAGNOSIS — Z9842 Cataract extraction status, left eye: Secondary | ICD-10-CM

## 2022-07-30 DIAGNOSIS — R509 Fever, unspecified: Secondary | ICD-10-CM | POA: Diagnosis not present

## 2022-07-30 DIAGNOSIS — R61 Generalized hyperhidrosis: Secondary | ICD-10-CM | POA: Diagnosis not present

## 2022-07-30 DIAGNOSIS — R410 Disorientation, unspecified: Secondary | ICD-10-CM | POA: Diagnosis not present

## 2022-07-30 DIAGNOSIS — M25561 Pain in right knee: Secondary | ICD-10-CM | POA: Diagnosis not present

## 2022-07-30 DIAGNOSIS — E1129 Type 2 diabetes mellitus with other diabetic kidney complication: Secondary | ICD-10-CM | POA: Diagnosis present

## 2022-07-30 DIAGNOSIS — F3176 Bipolar disorder, in full remission, most recent episode depressed: Secondary | ICD-10-CM | POA: Diagnosis not present

## 2022-07-30 DIAGNOSIS — F3289 Other specified depressive episodes: Secondary | ICD-10-CM | POA: Diagnosis not present

## 2022-07-30 LAB — URINE DRUG SCREEN, QUALITATIVE (ARMC ONLY)
Amphetamines, Ur Screen: NOT DETECTED
Barbiturates, Ur Screen: NOT DETECTED
Benzodiazepine, Ur Scrn: NOT DETECTED
Cannabinoid 50 Ng, Ur ~~LOC~~: NOT DETECTED
Cocaine Metabolite,Ur ~~LOC~~: NOT DETECTED
MDMA (Ecstasy)Ur Screen: NOT DETECTED
Methadone Scn, Ur: NOT DETECTED
Opiate, Ur Screen: NOT DETECTED
Phencyclidine (PCP) Ur S: NOT DETECTED
Tricyclic, Ur Screen: NOT DETECTED

## 2022-07-30 LAB — CBC
HCT: 33.2 % — ABNORMAL LOW (ref 39.0–52.0)
Hemoglobin: 11.6 g/dL — ABNORMAL LOW (ref 13.0–17.0)
MCH: 35.3 pg — ABNORMAL HIGH (ref 26.0–34.0)
MCHC: 34.9 g/dL (ref 30.0–36.0)
MCV: 100.9 fL — ABNORMAL HIGH (ref 80.0–100.0)
Platelets: 125 10*3/uL — ABNORMAL LOW (ref 150–400)
RBC: 3.29 MIL/uL — ABNORMAL LOW (ref 4.22–5.81)
RDW: 13 % (ref 11.5–15.5)
WBC: 4.6 10*3/uL (ref 4.0–10.5)
nRBC: 0 % (ref 0.0–0.2)

## 2022-07-30 LAB — COMPREHENSIVE METABOLIC PANEL
ALT: 22 U/L (ref 0–44)
AST: 26 U/L (ref 15–41)
Albumin: 4.3 g/dL (ref 3.5–5.0)
Alkaline Phosphatase: 88 U/L (ref 38–126)
Anion gap: 10 (ref 5–15)
BUN: 32 mg/dL — ABNORMAL HIGH (ref 8–23)
CO2: 31 mmol/L (ref 22–32)
Calcium: 9.1 mg/dL (ref 8.9–10.3)
Chloride: 100 mmol/L (ref 98–111)
Creatinine, Ser: 4.66 mg/dL — ABNORMAL HIGH (ref 0.61–1.24)
GFR, Estimated: 12 mL/min — ABNORMAL LOW (ref >60)
Glucose, Bld: 94 mg/dL (ref 70–99)
Potassium: 3.6 mmol/L (ref 3.5–5.1)
Sodium: 141 mmol/L (ref 135–145)
Total Bilirubin: 0.8 mg/dL (ref 0.3–1.2)
Total Protein: 7.4 g/dL (ref 6.5–8.1)

## 2022-07-30 LAB — ETHANOL: Alcohol, Ethyl (B): <10 mg/dL (ref <10)

## 2022-07-30 LAB — ACETAMINOPHEN LEVEL: Acetaminophen (Tylenol), Serum: <10 ug/mL — ABNORMAL LOW (ref 10–30)

## 2022-07-30 LAB — SALICYLATE LEVEL: Salicylate Lvl: <7 mg/dL — ABNORMAL LOW (ref 7.0–30.0)

## 2022-07-30 MED ORDER — HYDRALAZINE HCL 50 MG PO TABS
100.0000 mg | ORAL_TABLET | Freq: Three times a day (TID) | ORAL | Status: DC
  Administered 2022-07-30 – 2022-08-04 (×13): 100 mg via ORAL
  Filled 2022-07-30 (×18): qty 2

## 2022-07-30 MED ORDER — POLYETHYLENE GLYCOL 3350 17 G PO PACK
17.0000 g | PACK | Freq: Every day | ORAL | Status: DC | PRN
  Administered 2022-08-07 – 2022-08-14 (×3): 17 g via ORAL
  Filled 2022-07-30 (×3): qty 1

## 2022-07-30 MED ORDER — LAMOTRIGINE 25 MG PO TABS
25.0000 mg | ORAL_TABLET | Freq: Every day | ORAL | Status: DC
  Administered 2022-07-31 – 2022-08-02 (×3): 25 mg via ORAL
  Filled 2022-07-30 (×3): qty 1

## 2022-07-30 MED ORDER — VITAMIN D 25 MCG (1000 UNIT) PO TABS
1000.0000 [IU] | ORAL_TABLET | Freq: Every day | ORAL | Status: DC
  Administered 2022-07-31 – 2022-09-25 (×57): 1000 [IU] via ORAL
  Filled 2022-07-30 (×57): qty 1

## 2022-07-30 MED ORDER — OLANZAPINE 5 MG PO TABS
5.0000 mg | ORAL_TABLET | Freq: Every day | ORAL | Status: DC
  Administered 2022-07-30 – 2022-08-01 (×3): 5 mg via ORAL
  Filled 2022-07-30 (×3): qty 1

## 2022-07-30 MED ORDER — FLUOXETINE HCL 20 MG PO CAPS
20.0000 mg | ORAL_CAPSULE | Freq: Every day | ORAL | Status: DC
  Administered 2022-07-31 – 2022-08-01 (×2): 20 mg via ORAL
  Filled 2022-07-30 (×2): qty 1

## 2022-07-30 MED ORDER — LAMOTRIGINE 25 MG PO TABS: 25.0000 mg | ORAL_TABLET | Freq: Every day | ORAL | Status: DC

## 2022-07-30 MED ORDER — AMLODIPINE BESYLATE 5 MG PO TABS
10.0000 mg | ORAL_TABLET | Freq: Every day | ORAL | Status: DC
  Administered 2022-08-01 – 2022-08-06 (×5): 10 mg via ORAL
  Filled 2022-07-30 (×5): qty 2

## 2022-07-30 MED ORDER — ACETAMINOPHEN 325 MG PO TABS
650.0000 mg | ORAL_TABLET | Freq: Four times a day (QID) | ORAL | Status: DC | PRN
  Administered 2022-08-11 – 2022-09-19 (×5): 650 mg via ORAL
  Filled 2022-07-30 (×5): qty 2

## 2022-07-30 MED ORDER — AMLODIPINE BESYLATE 5 MG PO TABS: 10.0000 mg | ORAL_TABLET | Freq: Every day | ORAL | Status: DC

## 2022-07-30 MED ORDER — FLUTICASONE PROPIONATE 50 MCG/ACT NA SUSP: 2.0000 | Freq: Every day | NASAL | Status: DC | PRN

## 2022-07-30 MED ORDER — BENAZEPRIL HCL 20 MG PO TABS: 40.0000 mg | ORAL_TABLET | Freq: Every day | ORAL | Status: DC

## 2022-07-30 MED ORDER — ROSUVASTATIN CALCIUM 10 MG PO TABS: 10.0000 mg | ORAL_TABLET | Freq: Every day | ORAL | Status: DC

## 2022-07-30 MED ORDER — LORATADINE 10 MG PO TABS: 10.0000 mg | ORAL_TABLET | Freq: Every day | ORAL | Status: DC

## 2022-07-30 MED ORDER — LORATADINE 10 MG PO TABS
10.0000 mg | ORAL_TABLET | Freq: Every day | ORAL | Status: DC
  Administered 2022-07-31 – 2022-09-25 (×57): 10 mg via ORAL
  Filled 2022-07-30 (×57): qty 1

## 2022-07-30 MED ORDER — FLUOXETINE HCL 20 MG PO CAPS: 20.0000 mg | ORAL_CAPSULE | Freq: Every day | ORAL | Status: DC

## 2022-07-30 MED ORDER — BENAZEPRIL HCL 20 MG PO TABS
40.0000 mg | ORAL_TABLET | Freq: Every day | ORAL | Status: DC
  Administered 2022-08-01 – 2022-08-09 (×8): 40 mg via ORAL
  Filled 2022-07-30 (×11): qty 2

## 2022-07-30 MED ORDER — VITAMIN D 25 MCG (1000 UNIT) PO TABS: 1000.0000 [IU] | ORAL_TABLET | Freq: Every day | ORAL | Status: DC

## 2022-07-30 MED ORDER — ALUM & MAG HYDROXIDE-SIMETH 200-200-20 MG/5ML PO SUSP: 30.0000 mL | ORAL | Status: DC | PRN

## 2022-07-30 MED ORDER — HYDRALAZINE HCL 50 MG PO TABS: 100.0000 mg | ORAL_TABLET | Freq: Three times a day (TID) | ORAL | Status: DC

## 2022-07-30 MED ORDER — ROSUVASTATIN CALCIUM 5 MG PO TABS
10.0000 mg | ORAL_TABLET | Freq: Every day | ORAL | Status: DC
  Administered 2022-07-31 – 2022-09-25 (×57): 10 mg via ORAL
  Filled 2022-07-30 (×57): qty 2

## 2022-07-30 MED ORDER — ALLOPURINOL 100 MG PO TABS
100.0000 mg | ORAL_TABLET | Freq: Two times a day (BID) | ORAL | Status: DC
  Administered 2022-07-30 – 2022-09-25 (×112): 100 mg via ORAL
  Filled 2022-07-30 (×115): qty 1

## 2022-07-30 MED ORDER — MAGNESIUM HYDROXIDE 400 MG/5ML PO SUSP
30.0000 mL | Freq: Every day | ORAL | Status: DC | PRN
Start: 2022-07-30 — End: 2022-07-30

## 2022-07-30 MED ORDER — ALLOPURINOL 100 MG PO TABS: 100.0000 mg | ORAL_TABLET | Freq: Two times a day (BID) | ORAL | Status: DC

## 2022-07-30 MED ORDER — OLANZAPINE 5 MG PO TABS: 5.0000 mg | ORAL_TABLET | Freq: Every day | ORAL | Status: DC

## 2022-07-30 NOTE — ED Provider Notes (Signed)
[facility] Provider Note    Event Date/Time   First MD Initiated Contact with Patient 07/30/22 1228     (approximate)   History   Mental Health Problem   HPI  Luis Miller. is a 77 y.o. male with history of depression who comes in with worsening thoughts of it.  Patient is a dialysis patient and did have treatment yesterday.  Patient denies any medical concerns.  Denies any falls, chest pain, shortness of breath.  He states that he just been having these thoughts and had needed to be admitted and patient previously denies any current plan but states that if he was at home long enough he thinks that he could think of something.   Physical Exam   Triage Vital Signs: ED Triage Vitals [07/30/22 1144]  Enc Vitals Group     BP 127/69     Pulse Rate 78     Resp 18     Temp 98 F (36.7 C)     Temp Source Oral     SpO2 95 %     Weight      Height      Head Circumference      Peak Flow      Pain Score      Pain Loc      Pain Edu?      Excl. in GC?     Most recent vital signs: Vitals:   07/30/22 1144  BP: 127/69  Pulse: 78  Resp: 18  Temp: 98 F (36.7 C)  SpO2: 95%     General: Awake, no distress.  CV:  Good peripheral perfusion.  Resp:  Normal effort.  Abd:  No distention.  Other:     ED Results / Procedures / Treatments   Labs (all labs ordered are listed, but only abnormal results are displayed) Labs Reviewed  COMPREHENSIVE METABOLIC PANEL - Abnormal; Notable for the following components:      Result Value   BUN 32 (*)    Creatinine, Ser 4.66 (*)    GFR, Estimated 12 (*)    All other components within normal limits  SALICYLATE LEVEL - Abnormal; Notable for the following components:   Salicylate Lvl <7.0 (*)    All other components within normal limits  ACETAMINOPHEN LEVEL - Abnormal; Notable for the following components:   Acetaminophen (Tylenol), Serum <10 (*)    All other components within normal limits  CBC -  Abnormal; Notable for the following components:   RBC 3.29 (*)    Hemoglobin 11.6 (*)    HCT 33.2 (*)    MCV 100.9 (*)    MCH 35.3 (*)    Platelets 125 (*)    All other components within normal limits  ETHANOL  URINE DRUG SCREEN, QUALITATIVE ([facility] ONLY)     PROCEDURES:  Critical Care performed: No  Procedures   MEDICATIONS ORDERED IN ED: Medications - No data to display   IMPRESSION / MDM / ASSESSMENT AND PLAN / ED COURSE  I reviewed the triage vital signs and the nursing notes.   Patient's presentation is most consistent with acute presentation with potential threat to life or bodily function.   Urine drug is negative.  Creatinine shows potassium is normal.  EtOH negative Sisley negative Tylenol negative  Pt is without any acute medical complaints. No exam findings to suggest medical cause of current presentation. Will order psychiatric screening labs and discuss further w/ psychiatric service.  D/d includes  but is not limited to psychiatric disease, behavioral/personality disorder, inadequate socioeconomic support, medical.  Based on HPI, exam, unremarkable labs, no concern for acute medical problem at this time. No rigidity, clonus, hyperthermia, focal neurologic deficit, diaphoresis, tachycardia, meningismus, ataxia, gait abnormality or other finding to suggest this visit represents a non-psychiatric problem. Screening labs reviewed.    Given this, pt medically cleared, to be dispositioned per Psych.    The patient has been placed in psychiatric observation due to the need to provide a safe environment for the patient while obtaining psychiatric consultation and evaluation, as well as ongoing medical and medication management to treat the patient's condition.  The patient has not been placed under full IVC at this time.     FINAL CLINICAL IMPRESSION(S) / ED DIAGNOSES   Final diagnoses:  Depression, unspecified depression type     Rx / DC Orders   ED  Discharge Orders     None        Note:  This document was prepared using Dragon voice recognition software and may include unintentional dictation errors.   Concha Se, MD 07/30/22 [phone]

## 2022-07-30 NOTE — ED Notes (Signed)
MD Clapacs at bedside  

## 2022-07-30 NOTE — BH Assessment (Signed)
Comprehensive Clinical Assessment (CCA) Note  07/30/2022 Luis Miller [id number]  Chief Complaint:  Chief Complaint  Patient presents with   Mental Health Problem   Visit Diagnosis: Bipolar I disorder, most recent episode depressed    Luis Maduro T. Cheeck Jr. "Luis Miller" is a 77 year old male who presents to the ER, after he was advised by his outpatient provider. Per the patient, his symptoms of depression have increase. However, per his description, it appears to be anxiety. He is feeling overwhelmed and having trouble focusing and making decisions. He shared at his recent visit, with his outpatient provider, on May 6th. Thus, they made a few medication adjustments. Per his ex-wife Luis Miller.), they patient has had similar experiences in the past, which led to him been psychiatrically admitted. She was unable to give specific symptoms with current presentation because they no longer live together and don't have the same level of interactions. However, she knew something was wrong when he told her he wasn't going "dancing tonight." Patient attends the senior dancing event every Friday and he looks forward to them.  During the interview, the patient was calm, cooperative and pleasant. He was able to participate without problems. Throughout the interview, he denied SI/HI and AV/H. He also denies the use of mind-altering substances.  CCA Screening, Triage and Referral (STR)  Patient Reported Information How did you hear about [place]? No data recorded What Is the Reason for Your Visit/Call Today? Patient came to the ER because he is feeling overwhelmed and having memory trouble.  How Long Has This Been Causing You Problems? 1 wk - 1 month  What Do You Feel Would Help You the Most Today? Treatment for Depression or other mood problem   Have You Recently Had Any Thoughts About Hurting Yourself? No  Are You Planning to Commit Suicide/Harm Yourself At This time? No   Flowsheet Row ED from 07/30/2022  in [facility] Department at [facility] Visit from 07/26/2022 in confidential department Office Visit from 01/25/2022 in confidential department  C-SSRS RISK CATEGORY No Risk No Risk No Risk       Have you Recently Had Thoughts About Hurting Someone Luis Miller? No  Are You Planning to Harm Someone at This Time? No  Explanation: n/a   Have You Used Any Alcohol or Drugs in the Past 24 Hours? No  What Did You Use and How Much? n/a   Do You Currently Have a Therapist/Psychiatrist? Yes  Name of Therapist/Psychiatrist: Name of Therapist/Psychiatrist: [facility]   Have You Been Recently Discharged From Any Office Practice or Programs? No  Explanation of Discharge From Practice/Program: No data recorded    CCA Screening Triage Referral Assessment Type of Contact: Face-to-Face  Telemedicine Service Delivery:   Is this Initial or Reassessment?   Date Telepsych consult ordered in [facility]:    Time Telepsych consult ordered in [facility]:    Location of Assessment: [facility] ED  Provider Location: [facility] ED   Collateral Involvement: Spoke with the patient's ex-wife Luis Miller)   Does Patient Have a Automotive engineer Guardian? No  Legal Guardian Contact Information: No data recorded Copy of Legal Guardianship Form: No data recorded Legal Guardian Notified of Arrival: No data recorded Legal Guardian Notified of Pending Discharge: No data recorded If Minor and Not Living with Parent(s), Who has Custody? No data recorded Is CPS involved or ever been involved? Never  Is APS involved or ever been involved? Never   Patient Determined To Be At Risk for  Harm To Self or Others Based on Review of Patient Reported Information or Presenting Complaint? No  Method: No data recorded Availability of Means: No data recorded Intent: No data recorded Notification Required: No data recorded Additional Information for Danger to Others Potential: No data  recorded Additional Comments for Danger to Others Potential: No data recorded Are There Guns or Other Weapons in Your Home? No data recorded Types of Guns/Weapons: No data recorded Are These Weapons Safely Secured?                            No data recorded Who Could Verify You Are Able To Have These Secured: No data recorded Do You Have any Outstanding Charges, Pending Court Dates, Parole/Probation? No data recorded Contacted To Inform of Risk of Harm To Self or Others: No data recorded   Does Patient Present under Involuntary Commitment? No    [place] of Residence: [place]   Patient Currently Receiving the Following Services: Medication Management   Determination of Need: Emergent (2 hours)   Options For Referral: ED Visit; ED Referral     CCA Biopsychosocial Patient Reported Schizophrenia/Schizoaffective Diagnosis in Past: No   Strengths: Stable housing, have support system and have some insight.   Mental Health Symptoms Depression:   Change in energy/activity; Difficulty Concentrating   Duration of Depressive symptoms:  Duration of Depressive Symptoms: Less than two weeks   Mania:   N/A; Change in energy/activity   Anxiety:    Difficulty concentrating; Worrying   Psychosis:   None   Duration of Psychotic symptoms:    Trauma:   N/A   Obsessions:   N/A   Compulsions:   N/A   Inattention:   N/A   Hyperactivity/Impulsivity:   N/A   Oppositional/Defiant Behaviors:   N/A   Emotional Irregularity:   N/A   Other Mood/Personality Symptoms:  No data recorded   Mental Status Exam Appearance and self-care  Stature:   Average   Weight:   Average weight   Clothing:   Age-appropriate   Grooming:   Normal   Cosmetic use:   None   Posture/gait:   Normal   Motor activity:   -- (within normal range)   Sensorium  Attention:   Normal   Concentration:   Normal   Orientation:   Situation   Recall/memory:   Normal   Affect  and Mood  Affect:   Full Range   Mood:   Depressed; Anxious   Relating  Eye contact:   Normal   Facial expression:   Anxious   Attitude toward examiner:   Cooperative   Thought and Language  Speech flow:  Clear and Coherent; Normal   Thought content:   Appropriate to Mood and Circumstances   Preoccupation:   Other (Comment)   Hallucinations:   None   Organization:   Coherent; Intact   [employer] of Knowledge:   Average   Intelligence:   Average   Abstraction:   Functional   Judgement:   Fair   Dance movement psychotherapist:   Adequate   Insight:   Fair   Decision Making:   Vacilates   Social Functioning  Social Maturity:   Responsible; Self-centered   Social Judgement:   Normal   Stress  Stressors:   Transitions; Financial   Coping Ability:   Overwhelmed   Skill Deficits:   None   Supports:   Family     Religion:  Leisure/Recreation: Leisure / Recreation Do You Have Hobbies?: Yes Leisure and Hobbies: Dancing  Exercise/Diet: Exercise/Diet Do You Exercise?: No Have You Gained or Lost A Significant Amount of Weight in the Past Six Months?: No Do You Follow a Special Diet?: No Do You Have Any Trouble Sleeping?: No   CCA Employment/Education Employment/Work Situation: Employment / 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  Childhood History:   Childhood History Did patient suffer any verbal/emotional/physical/sexual abuse as a child?: No Did patient suffer from severe childhood neglect?: No Has patient ever been sexually abused/assaulted/raped as an adolescent or adult?: No Was the patient ever a victim of a crime or a disaster?: No Witnessed domestic violence?: No Has patient been affected by domestic violence as an adult?: No       CCA Substance Use Alcohol/Drug Use: Alcohol / Drug Use Pain Medications: See MAR Prescriptions: See MAR Over the Counter: See MAR History of alcohol / drug use?: No history of alcohol / drug abuse Longest period of sobriety (when/how long): n/a    ASAM's:  Six Dimensions of Multidimensional Assessment  Dimension 1:  Acute Intoxication and/or Withdrawal Potential:      Dimension 2:  Biomedical Conditions and Complications:      Dimension 3:  Emotional, Behavioral, or Cognitive Conditions and Complications:     Dimension 4:  Readiness to Change:     Dimension 5:  Relapse, Continued use, or Continued Problem Potential:     Dimension 6:  Recovery/Living Environment:     ASAM Severity Score:    ASAM Recommended Level of Treatment:     Substance use Disorder (SUD)    Recommendations for Services/Supports/Treatments:    Discharge Disposition:    DSM5 Diagnoses: Patient Active Problem List   Diagnosis Date Noted   Bipolar I disorder, mild, current or most recent episode depressed, with anxious distress (HCC) 07/26/2022   Anxiety disorder 01/06/2022   ESRD on dialysis (HCC) 08/24/2021   Sleep apnea 08/24/2021   Type 2 diabetes mellitus with other diabetic kidney complication (HCC) 05/25/2021   Abnormal TSH 10/01/2020   Memory loss 05/19/2020   Hyperlipidemia 05/19/2020   At risk for prolonged QT interval syndrome 04/24/2020   Insomnia due to medical condition 04/24/2020   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   Hypertension associated with diabetes (HCC) 02/01/2019   Total knee  replacement status 02/22/2018   Hemorrhoids, internal 12/12/2017   Hx of adenomatous colonic polyps 12/05/2017   Thrombosed external hemorrhoid 12/05/2017   Drug-induced tremor 05/08/2017   Secondary hyperparathyroidism of renal origin (HCC) 04/05/2017   Advanced care planning/counseling discussion 04/05/2017   Carotid bruit 04/05/2017   Gout 02/26/2016   Erectile dysfunction due to arterial insufficiency 07/08/2015   Obesity 11/27/2014   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   Bipolar disorder, in full remission, most recent episode depressed (HCC) 09/09/2014   High risk medication use 10/18/2013   Elevated prostate specific antigen (PSA) 01/13/2012   Benign prostatic hyperplasia with urinary obstruction 01/13/2012   Peyronie's disease 01/13/2012  Referrals to Alternative Service(s): Referred to Alternative Service(s):   Place:   Date:   Time:    Referred to Alternative Service(s):   Place:   Date:   Time:    Referred to Alternative Service(s):   Place:   Date:   Time:    Referred to Alternative Service(s):   Place:   Date:   Time:     Lilyan Gilford MS, LCAS, [facility], [facility] Therapeutic Triage Specialist 07/30/2022 3:41 PM

## 2022-07-30 NOTE — ED Notes (Signed)
Pt updated on status- waiting to hear if bed is available at this facility.

## 2022-07-30 NOTE — Telephone Encounter (Signed)
Luis Miller this is the message I received from [place] . Patient called office stating he was just seen on Mon. 07/26/22 and he is no better. Wasn't sure if he should call 911 or talk to you. Still anxious and has been this way before he says. Informed patient to go to the ED if he is feeling this way. Stated he will do so.    Since I am getting duplicate messages regarding this patient , not sure if he called back again.   Please make sure you call back patient and advise to go to urgent care or ED if in a crisis.  Otherwise please get him on a waitlist and we can call if any cancellations next week.

## 2022-07-30 NOTE — Progress Notes (Signed)
   07/30/22 2100  Psych Admission Type (Psych Patients Only)  Admission Status Voluntary  Psychosocial Assessment  Patient Complaints Depression  Eye Contact Fair  Facial Expression Flat  Affect Depressed  Speech Logical/coherent  Interaction Assertive  Motor Activity Other (Comment) (WNL)  Appearance/Hygiene In scrubs  Behavior Characteristics Cooperative;Appropriate to situation  Mood Depressed  Thought Process  Coherency WDL  Content WDL  Delusions None reported or observed  Perception WDL  Hallucination None reported or observed  Judgment WDL  Confusion None  Danger to Self  Current suicidal ideation? Denies  Danger to Others  Danger to Others None reported or observed     D- Patient alert and oriented. Patient affect/mood.reported as "I don't know how I am feeling at this moment, maybe after go to sleep and wake up in the morning I may figure that out, but right now I just do not know. Denies SI, HI, AVH, and pain. Patient Goal: " I want to get back productive again, maybe get my medication together."  A- Scheduled medications administered to patient, per MD orders. Support and encouragement provided.  Routine safety checks conducted every 15 minutes.  Patient informed to notify staff with problems or concerns. R- No adverse drug reactions noted. Patient contracts for safety at this time. Patient compliant with medications and treatment plan. Patient receptive, calm, and cooperative. Patient interacts well with others on the unit.  Patient remains safe at this time.

## 2022-07-30 NOTE — Tx Team (Signed)
Initial Treatment Plan 07/30/2022 6:49 PM [name]. ZOX:[id number]    PATIENT STRESSORS: Medication change or noncompliance     PATIENT STRENGTHS: Ability for insight  Average or above average intelligence  Capable of independent living  Communication skills  Motivation for treatment/growth  Supportive family/friends    PATIENT IDENTIFIED PROBLEMS:   "I am not right"  "Very depressed"  "I am not productive"               DISCHARGE CRITERIA:  Ability to meet basic life and health needs Adequate post-discharge living arrangements Improved stabilization in mood, thinking, and/or behavior  PRELIMINARY DISCHARGE PLAN: Attend PHP/IOP Outpatient therapy Return to previous living arrangement  PATIENT/FAMILY INVOLVEMENT: This treatment plan has been presented to and reviewed with the patient, Luis Miller. The patient has been given the opportunity to ask questions and make suggestions.   Luane School, RN 07/30/2022, 6:49 PM

## 2022-07-30 NOTE — Telephone Encounter (Signed)
Patient called office stating he was just seen on Mon. 07/26/22 and he is no better. Wasn't sure if he should call 911 or talk to you. Still anxious and has been this way before he says. Informed patient to go to the ED if he is feeling this way. Stated he will do so.

## 2022-07-30 NOTE — BHH Suicide Risk Assessment (Signed)
[facility] Admission Suicide Risk Assessment   Nursing information obtained from:    Demographic factors:    Current Mental Status:    Loss Factors:    Historical Factors:    Risk Reduction Factors:     Total Time spent with patient: 45 minutes Principal Problem: Bipolar I disorder, most recent episode depressed (HCC) Diagnosis:  Principal Problem:   Bipolar I disorder, most recent episode depressed (HCC) Active Problems:   ESRD on dialysis [facility])  Subjective Data: Patient seen and chart reviewed.  77 year old man with a history of longstanding bipolar disorder typically presenting with major depression.  Patient brought to the emergency room from his psychiatrist's office today with worsening depression.  He feels his depression has been much worse for the past week.  He saw his regular psychiatrist twice this week.  Earlier in the week she had increased his antidepressant but today he was still feeling down.  Mood feels down and sad and negative.  Little motivation or interest in things.  Sleeping and eating are unchanged from normal.  Denies hallucinations.  Passive suicidal thoughts and hopelessness.  Feels frightened and afraid that he would become actively suicidal at home.  Denies substance abuse.  Continued Clinical Symptoms:    The "Alcohol Use Disorders Identification Test", Guidelines for Use in Primary Care, Second Edition.  World Science writer [facility]). Score between 0-7:  no or low risk or alcohol related problems. Score between 8-15:  moderate risk of alcohol related problems. Score between 16-19:  high risk of alcohol related problems. Score 20 or above:  warrants further diagnostic evaluation for alcohol dependence and treatment.   CLINICAL FACTORS:   Bipolar Disorder:   Depressive phase   Musculoskeletal: Strength & Muscle Tone: within normal limits Gait & Station: normal Patient leans: N/A  Psychiatric Specialty Exam:  Presentation  General Appearance: No data  recorded Eye Contact:No data recorded Speech:No data recorded Speech Volume:No data recorded Handedness:No data recorded  Mood and Affect  Mood:No data recorded Affect:No data recorded  Thought Process  Thought Processes:No data recorded Descriptions of Associations:No data recorded Orientation:No data recorded Thought Content:No data recorded History of Schizophrenia/Schizoaffective disorder:No  Duration of Psychotic Symptoms:No data recorded Hallucinations:No data recorded Ideas of Reference:No data recorded Suicidal Thoughts:No data recorded Homicidal Thoughts:No data recorded  Sensorium  Memory:No data recorded Judgment:No data recorded Insight:No data recorded  Executive Functions  Concentration:No data recorded Attention Span:No data recorded Recall:No data recorded Fund of Knowledge:No data recorded Language:No data recorded  Psychomotor Activity  Psychomotor Activity:No data recorded  Assets  Assets:No data recorded  Sleep  Sleep:No data recorded   Physical Exam: Physical Exam Vitals and nursing note reviewed.  Constitutional:      Appearance: Normal appearance.  HENT:     Head: Normocephalic and atraumatic.     Mouth/Throat:     Pharynx: Oropharynx is clear.  Eyes:     Pupils: Pupils are equal, round, and reactive to light.  Cardiovascular:     Rate and Rhythm: Normal rate and regular rhythm.  Pulmonary:     Effort: Pulmonary effort is normal.     Breath sounds: Normal breath sounds.  Abdominal:     General: Abdomen is flat.     Palpations: Abdomen is soft.  Musculoskeletal:        General: Normal range of motion.  Skin:    General: Skin is warm and dry.  Neurological:     General: No focal deficit present.     Mental Status:  He is alert. Mental status is at baseline.  Psychiatric:        Attention and Perception: Attention normal.        Mood and Affect: Mood is depressed. Affect is blunt.        Speech: Speech is delayed.         Behavior: Behavior is slowed.        Thought Content: Thought content includes suicidal ideation. Thought content does not include suicidal plan.        Cognition and Memory: Cognition normal.        Judgment: Judgment normal.    Review of Systems  Constitutional: Negative.   HENT: Negative.    Eyes: Negative.   Respiratory: Negative.    Cardiovascular: Negative.   Gastrointestinal: Negative.   Musculoskeletal: Negative.   Skin: Negative.   Neurological: Negative.   Psychiatric/Behavioral:  Positive for depression and suicidal ideas. Negative for hallucinations, memory loss and substance abuse. The patient is nervous/anxious. The patient does not have insomnia.    Blood pressure 127/69, pulse 78, temperature 98 F (36.7 C), temperature source Oral, resp. rate 18, SpO2 95 %. There is no height or weight on file to calculate BMI.   COGNITIVE FEATURES THAT CONTRIBUTE TO RISK:  Polarized thinking    SUICIDE RISK:   Mild:  Suicidal ideation of limited frequency, intensity, duration, and specificity.  There are no identifiable plans, no associated intent, mild dysphoria and related symptoms, good self-control (both objective and subjective assessment), few other risk factors, and identifiable protective factors, including available and accessible social support.  PLAN OF CARE: Recommend psychiatric hospitalization.  Case reviewed with DTS.  Patient is agreeable.  Working on plans for possible hospital stay.  Ongoing assessment of dangerousness prior to discharge  I certify that inpatient services furnished can reasonably be expected to improve the patient's condition.   Mordecai Rasmussen, MD 07/30/2022, 3:59 PM

## 2022-07-30 NOTE — ED Notes (Signed)
Dinner tray was delivered. Pt stated "I just ate. I don't want that now." This tech put the tray aside for now and will see if the pt wants it later.

## 2022-07-30 NOTE — ED Notes (Signed)
Patient eating lunch now.

## 2022-07-30 NOTE — Group Note (Signed)
Date:  07/30/2022 Time:  9:00 PM  Group Topic/Focus:  Recovery Goals:   The focus of this group is to identify appropriate goals for recovery and establish a plan to achieve them.    Participation Level:  Active  Participation Quality:  Sharing  Affect:  Appropriate  Cognitive:  Alert  Insight: Good  Engagement in Group:  Engaged  Modes of Intervention:  Discussion and Education  Additional Comments:    Dallie Piles Yulitza Shorts 07/30/2022, 9:00 PM

## 2022-07-30 NOTE — Plan of Care (Signed)
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
## 2022-07-30 NOTE — ED Notes (Signed)
Pt dressed out in scrubs   Pt belongings: Yellow necklace Striped button up shirt Jeans Black belt Socks [employer] copy with medications

## 2022-07-30 NOTE — ED Provider Triage Note (Signed)
Emergency Medicine Provider Triage Evaluation Note  Luis Balsam. , a 77 y.o. male  was evaluated in triage.  Pt complains of depression, mental health crisis, not suicidal but states he has been this way before and had to stay in the hospital.  Review of Systems  Positive: Depression Negative: Denies suicidal homicidal ideations  Physical Exam  BP 127/69 (BP Location: Left Arm)   Pulse 78   Temp 98 F (36.7 C) (Oral)   Resp 18   SpO2 95%  Gen:   Awake, no distress   Resp:  Normal effort  MSK:   Moves extremities without difficulty  Other:    Medical Decision Making  Medically screening exam initiated at 11:47 AM.  Appropriate orders placed.  Luis Balsam. was informed that the remainder of the evaluation will be completed by another provider, this initial triage assessment does not replace that evaluation, and the importance of remaining in the ED until their evaluation is complete.     Faythe Ghee, PA-C 07/30/22 1148

## 2022-07-30 NOTE — Telephone Encounter (Signed)
Noted  

## 2022-07-30 NOTE — Telephone Encounter (Signed)
called patient and he is already at the ER. pt was told that he was put on our waiting list.

## 2022-07-30 NOTE — ED Triage Notes (Signed)
Pt sts that he has been depressed and feels like he needs to be seen. Pt denies any SI/HI. Pt sts that he is a dialysis pt and that last tx was yesterday.

## 2022-07-30 NOTE — ED Notes (Signed)
Voluntary consent signed by patient- witnessed by this RN

## 2022-07-30 NOTE — ED Notes (Signed)
Pt able to perform all ADLs independently. Pt walks without assistive device as well.

## 2022-07-30 NOTE — Progress Notes (Signed)
Patient admitted voluntary to [facility] from [facility] ED at approx. 1815 with a diagnosis of bipolar disorder. Patient presents for assessment via WC but is ambulatory. He is A+O x4. Patient reports being depressed 8/10 and doesn't want to go back home right now. "I am not right. I have to get back productive. Even though I am seeing my psychiatrist, I am still depressed."  Patient's affect is depressed, speech is logical and coherent. Patient endorses anxiety 6/10. He denies AVH. He denies SI/HI and agrees to contract for safety. 0/10 pain. Denies use of a mobility aid at home. Wears reading glasses and currently keeps on his person. Reports last BM today. Denies smoking cigarettes, alcohol use, and substance use. Patient reports living alone but being very independent. States that his support system is his church. He says his goal is to get better so that he can return back to paying his bills and living life.    Skin assessment performed with Elexis, MHT. Skin clean, very dry, intact. R forearm/L hand bruise(states from bumping into objects). Bilat great toenails surgically removed.  Emotional support and reassurance provided throughout admission intake. Patient oriented to unit, room, and the call light.  POC reviewed with all questions answered.  Patient verbalized understanding. Consents signed, handout detailing the patient's rights and responsibilities provided. Will continue to monitor with ongoing Q15 minute safety checks per unit protocol.

## 2022-07-30 NOTE — ED Notes (Signed)
TTS at bedside with patient.   

## 2022-07-30 NOTE — ED Notes (Signed)
This RN attempted to call report- receiving RN in another patients room. Will call back in 10 minutes

## 2022-07-30 NOTE — Telephone Encounter (Signed)
pt called states that his depression has gotten worse. pt was last seen on 5-6 and next appt 7-1

## 2022-07-31 DIAGNOSIS — D631 Anemia in chronic kidney disease: Secondary | ICD-10-CM | POA: Diagnosis not present

## 2022-07-31 DIAGNOSIS — N2581 Secondary hyperparathyroidism of renal origin: Secondary | ICD-10-CM | POA: Diagnosis not present

## 2022-07-31 DIAGNOSIS — N186 End stage renal disease: Secondary | ICD-10-CM | POA: Diagnosis not present

## 2022-07-31 LAB — RENAL FUNCTION PANEL
Albumin: 4.2 g/dL (ref 3.5–5.0)
Anion gap: 10 (ref 5–15)
BUN: 43 mg/dL — ABNORMAL HIGH (ref 8–23)
CO2: 27 mmol/L (ref 22–32)
Calcium: 9.3 mg/dL (ref 8.9–10.3)
Chloride: 102 mmol/L (ref 98–111)
Creatinine, Ser: 5.48 mg/dL — ABNORMAL HIGH (ref 0.61–1.24)
GFR, Estimated: 10 mL/min — ABNORMAL LOW (ref >60)
Glucose, Bld: 110 mg/dL — ABNORMAL HIGH (ref 70–99)
Phosphorus: 3.5 mg/dL (ref 2.5–4.6)
Potassium: 3.6 mmol/L (ref 3.5–5.1)
Sodium: 139 mmol/L (ref 135–145)

## 2022-07-31 LAB — HEPATITIS B SURFACE ANTIGEN: Hepatitis B Surface Ag: NONREACTIVE

## 2022-07-31 LAB — CBC
HCT: 32.5 % — ABNORMAL LOW (ref 39.0–52.0)
Hemoglobin: 11.1 g/dL — ABNORMAL LOW (ref 13.0–17.0)
MCH: 34.6 pg — ABNORMAL HIGH (ref 26.0–34.0)
MCHC: 34.2 g/dL (ref 30.0–36.0)
MCV: 101.2 fL — ABNORMAL HIGH (ref 80.0–100.0)
Platelets: 129 10*3/uL — ABNORMAL LOW (ref 150–400)
RBC: 3.21 MIL/uL — ABNORMAL LOW (ref 4.22–5.81)
RDW: 13.1 % (ref 11.5–15.5)
WBC: 4.2 10*3/uL (ref 4.0–10.5)
nRBC: 0 % (ref 0.0–0.2)

## 2022-07-31 MED ORDER — LIDOCAINE 4 % EX CREA
TOPICAL_CREAM | Freq: Once | CUTANEOUS | Status: AC
  Administered 2022-07-31: 1 via TOPICAL
  Filled 2022-07-31: qty 5

## 2022-07-31 MED ORDER — LIDOCAINE HCL (PF) 1 % IJ SOLN: 5.0000 mL | INTRAMUSCULAR | Status: DC | PRN

## 2022-07-31 MED ORDER — LIDOCAINE-PRILOCAINE 2.5-2.5 % EX CREA
1.0000 | TOPICAL_CREAM | CUTANEOUS | Status: DC | PRN
  Administered 2022-08-12: 1 via TOPICAL

## 2022-07-31 MED ORDER — TEMAZEPAM 7.5 MG PO CAPS
7.5000 mg | ORAL_CAPSULE | Freq: Every evening | ORAL | Status: DC | PRN
  Administered 2022-08-01: 7.5 mg via ORAL
  Filled 2022-07-31: qty 1

## 2022-07-31 MED ORDER — HEPARIN SODIUM (PORCINE) 1000 UNIT/ML DIALYSIS: 1000.0000 [IU] | INTRAMUSCULAR | Status: DC | PRN

## 2022-07-31 MED ORDER — PENTAFLUOROPROP-TETRAFLUOROETH EX AERO: 1.0000 | INHALATION_SPRAY | CUTANEOUS | Status: DC | PRN

## 2022-07-31 MED ORDER — CHLORHEXIDINE GLUCONATE CLOTH 2 % EX PADS: 6.0000 | MEDICATED_PAD | Freq: Every day | CUTANEOUS | Status: DC

## 2022-07-31 MED ORDER — ALTEPLASE 2 MG IJ SOLR: 2.0000 mg | Freq: Once | INTRAMUSCULAR | Status: DC | PRN

## 2022-07-31 NOTE — Group Note (Signed)
Date:  07/31/2022 Time:  11:29 PM  Group Topic/Focus:  Building Self Esteem:   The Focus of this group is helping patients become aware of the effects of self-esteem on their lives, the things they and others do that enhance or undermine their self-esteem, seeing the relationship between their level of self-esteem and the choices they make and learning ways to enhance self-esteem.    Participation Level:  Active  Participation Quality:  Appropriate  Affect:  Appropriate  Cognitive:  Appropriate  Insight: Appropriate  Engagement in Group:  Engaged  Modes of Intervention:   stating what we are thankful for  Additional Comments:    Garry Heater 07/31/2022, 11:29 PM
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
## 2022-07-31 NOTE — Progress Notes (Signed)
Received patient in bed to unit.    Informed consent signed and in chart.    TX duration:2hr72min     Transported [facility] employee back to unit Hand-off given to patient's nurse. Amy Boisvert RN   Access used: L upper arm graft Access issues: None   Total UF removed: 1500  Medication(s) given:None Post HD VS: Stable  Post HD weight: 84.4kg     [facility]

## 2022-07-31 NOTE — [facility] Group Notes (Signed)
[facility] Group Notes:  (Nursing/MHT/Case Management/Adjunct)  Date:  07/31/2022  Time:  10:40 AM  Type of Therapy:   meditation therapy  Participation Level:  Active  Participation Quality:  Appropriate  Affect:  Appropriate  Cognitive:  Appropriate  Insight:  Appropriate  Engagement in Group:  Engaged  Modes of Intervention:  Activity  Summary of Progress/Problems:  Luis Miller 07/31/2022, 10:40 AM

## 2022-07-31 NOTE — Progress Notes (Signed)
Patient cooperative and pleasant.  Endorses feeling of depression. Denies anxiety, SI,HI and AVH.  Poor sleep last night.  Patient had a small breakfast due to having dialysis.  Dr. Toni Amend and nephrology contacted and orders for dialysis placed. Anti-hypertensive medications held.  All other medications administered as ordered.  Input/ output and daily weight documented. 15 min checks in place for safety.  Patient is present in the milieu.  Appropriate interaction with peers. Attended MHT group.   Patient left for dialysis at 1050.    Report called post dialysis at 1340 3.5 hr treatment 1500 mLs / 1.5 kilos removed  Patient returned to unit at 1344. VS WNL.

## 2022-07-31 NOTE — [facility] Suicide Risk Assessment (Signed)
[facility] Admission Suicide Risk Assessment   Nursing information obtained from:  Patient Demographic factors:  Male, Age 77 or older, Caucasian, Living alone Current Mental Status:  NA Loss Factors:  NA Historical Factors:  NA Risk Reduction Factors:  Positive social support  Total Time spent with patient: 30 minutes Principal Problem: Bipolar depression (HCC) Diagnosis:  Principal Problem:   Bipolar depression (HCC)  Subjective Data: 77 year old man with a history of chronic depression as part of a diagnosis of bipolar disorder.  Presents with worsening depression over the past couple weeks.  No specific suicidal plan but worried about how he would function at home alone and whether he might become suicidal.  No past suicidal attempts.  Currently cooperative with treatment.  Continued Clinical Symptoms:  Alcohol Use Disorder Identification Test Final Score (AUDIT): 0 The "Alcohol Use Disorders Identification Test", Guidelines for Use in Primary Care, Second Edition.  World Science writer [facility]). Score between 0-7:  no or low risk or alcohol related problems. Score between 8-15:  moderate risk of alcohol related problems. Score between 16-19:  high risk of alcohol related problems. Score 20 or above:  warrants further diagnostic evaluation for alcohol dependence and treatment.   CLINICAL FACTORS:   Bipolar Disorder:   Mixed State   Musculoskeletal: Strength & Muscle Tone: within normal limits Gait & Station: normal Patient leans: N/A  Psychiatric Specialty Exam:  Presentation  General Appearance: No data recorded Eye Contact:No data recorded Speech:No data recorded Speech Volume:No data recorded Handedness:No data recorded  Mood and Affect  Mood:No data recorded Affect:No data recorded  Thought Process  Thought Processes:No data recorded Descriptions of Associations:No data recorded Orientation:No data recorded Thought Content:No data recorded History of  Schizophrenia/Schizoaffective disorder:No  Duration of Psychotic Symptoms:No data recorded Hallucinations:No data recorded Ideas of Reference:No data recorded Suicidal Thoughts:No data recorded Homicidal Thoughts:No data recorded  Sensorium  Memory:No data recorded Judgment:No data recorded Insight:No data recorded  Executive Functions  Concentration:No data recorded Attention Span:No data recorded Recall:No data recorded Fund of Knowledge:No data recorded Language:No data recorded  Psychomotor Activity  Psychomotor Activity:No data recorded  Assets  Assets:No data recorded  Sleep  Sleep:No data recorded   Physical Exam: Physical Exam Vitals and nursing note reviewed.  Constitutional:      Appearance: Normal appearance.  HENT:     Head: Normocephalic and atraumatic.     Mouth/Throat:     Pharynx: Oropharynx is clear.  Eyes:     Pupils: Pupils are equal, round, and reactive to light.  Cardiovascular:     Rate and Rhythm: Normal rate and regular rhythm.  Pulmonary:     Effort: Pulmonary effort is normal.     Breath sounds: Normal breath sounds.  Abdominal:     General: Abdomen is flat.     Palpations: Abdomen is soft.  Musculoskeletal:        General: Normal range of motion.  Skin:    General: Skin is warm and dry.  Neurological:     General: No focal deficit present.     Mental Status: He is alert. Mental status is at baseline.  Psychiatric:        Attention and Perception: Attention normal.        Mood and Affect: Mood is anxious and depressed.        Speech: Speech normal.        Behavior: Behavior is cooperative.        Thought Content: Thought content normal.  Cognition and Memory: Cognition normal.        Judgment: Judgment normal.    Review of Systems  Constitutional: Negative.   HENT: Negative.    Eyes: Negative.   Respiratory: Negative.    Cardiovascular: Negative.   Gastrointestinal: Negative.   Musculoskeletal: Negative.   Skin:  Negative.   Neurological: Negative.   Psychiatric/Behavioral:  Positive for depression. The patient is nervous/anxious.    Blood pressure 100/62, pulse (!) 59, temperature 97.6 F (36.4 C), temperature source Oral, resp. rate 14, height 5\' 6"  (1.676 m), weight 84.1 kg, SpO2 98 %. Body mass index is 29.94 kg/m.   COGNITIVE FEATURES THAT CONTRIBUTE TO RISK:  None    SUICIDE RISK:   Minimal: No identifiable suicidal ideation.  Patients presenting with no risk factors but with morbid ruminations; may be classified as minimal risk based on the severity of the depressive symptoms  PLAN OF CARE: Continue 15-minute checks.  Continue current medicine.  Ongoing engagement in individual and group therapy.  Ongoing assessment of dangerousness prior to discharge  I certify that inpatient services furnished can reasonably be expected to improve the patient's condition.   Mordecai Rasmussen, MD 07/31/2022, 3:09 PM

## 2022-07-31 NOTE — Progress Notes (Signed)
   07/31/22 0600  15 Minute Checks  Location Bedroom  Visual Appearance Calm  Behavior Sleeping  Sleep (Behavioral Health Patients Only)  Calculate sleep? (Click Yes once per 24 hr at 0600 safety check) Yes  Documented sleep last 24 hours 7

## 2022-07-31 NOTE — Progress Notes (Signed)
[facility]  Dialysis Note   Subjective:   Seen and examined on hemodialysis treatment.  Feels much better. Appetite has been good.   HEMODIALYSIS FLOWSHEET:  Blood Flow Rate (mL/min): 400 mL/min Arterial Pressure (mmHg): -190 mmHg Venous Pressure (mmHg): 260 mmHg TMP (mmHg): 13 mmHg Ultrafiltration Rate (mL/min): 686 mL/min Dialysate Flow Rate (mL/min): 300 ml/min Dialysis Fluid Bolus: Normal Saline    Objective:  Vital signs in last 24 hours:  Temp:  [97.5 F (36.4 C)-98.6 F (37 C)] 97.6 F (36.4 C) (05/11 1100) Pulse Rate:  [55-81] 81 (05/11 1200) Resp:  [14-18] 16 (05/11 1200) BP: (127-149)/(64-82) 135/82 (05/11 1200) SpO2:  [95 %-100 %] 97 % (05/11 1200) Weight:  [84.1 kg-85 kg] 84.1 kg (05/11 0847)  Weight change:  Filed Weights   07/30/22 1746 07/31/22 0847  Weight: 85 kg 84.1 kg    Intake/Output: No intake/output data recorded.   Intake/Output this shift:  Total I/O In: 240 [P.O.:240] Out: 200 [Urine:200]  Physical Exam: General: NAD,   Head: Normocephalic, atraumatic. Moist oral mucosal membranes  Eyes: Anicteric, PERRL  Neck: Supple, trachea midline  Lungs:  Clear to auscultation  Heart: Regular rate and rhythm  Abdomen:  Soft, nontender,   Extremities:  peripheral edema.  Neurologic: Nonfocal, moving all four extremities  Skin: No lesions  Access:     Basic Metabolic Panel: Recent Labs  Lab 07/30/22 1148 07/31/22 1129  NA 141 139  K 3.6 3.6  CL 100 102  CO2 31 27  GLUCOSE 94 110*  BUN 32* 43*  CREATININE 4.66* 5.48*  CALCIUM 9.1 9.3  PHOS  --  3.5    Liver Function Tests: Recent Labs  Lab 07/30/22 1148 07/31/22 1129  AST 26  --   ALT 22  --   ALKPHOS 88  --   BILITOT 0.8  --   PROT 7.4  --   ALBUMIN 4.3 4.2   No results for input(s): "LIPASE", "AMYLASE" in the last 168 hours. No results for input(s): "AMMONIA" in the last 168 hours.  CBC: Recent Labs  Lab 07/30/22 1148 07/31/22 1129  WBC 4.6 4.2   HGB 11.6* 11.1*  HCT 33.2* 32.5*  MCV 100.9* 101.2*  PLT 125* 129*    Cardiac Enzymes: No results for input(s): "CKTOTAL", "CKMB", "CKMBINDEX", "TROPONINI" in the last 168 hours.  BNP: Invalid input(s): "POCBNP"  CBG: No results for input(s): "GLUCAP" in the last 168 hours.  Microbiology: Results for orders placed or performed in visit on 05/12/22  Microscopic Examination     Status: Abnormal   Collection Time: 05/12/22 10:39 AM   Urine  Result Value Ref Range Status   WBC, UA 6-10 (A) 0 - 5 /hpf Final   RBC, Urine 0-2 0 - 2 /hpf Final   Epithelial Cells (non renal) 0-10 0 - 10 /hpf Final   Bacteria, UA Moderate (A) None seen/Few Final    Coagulation Studies: No results for input(s): "LABPROT", "INR" in the last 72 hours.  Urinalysis: No results for input(s): "COLORURINE", "LABSPEC", "PHURINE", "GLUCOSEU", "HGBUR", "BILIRUBINUR", "KETONESUR", "PROTEINUR", "UROBILINOGEN", "NITRITE", "LEUKOCYTESUR" in the last 72 hours.  Invalid input(s): "APPERANCEUR"    Imaging: No results found.   Medications:     allopurinol  100 mg Oral BID   amLODipine  10 mg Oral Daily   benazepril  40 mg Oral Daily   Chlorhexidine Gluconate Cloth  6 each Topical Q0600   cholecalciferol  1,000 Units Oral Daily   FLUoxetine  20 mg Oral  Daily   hydrALAZINE  100 mg Oral TID   lamoTRIgine  25 mg Oral Daily   loratadine  10 mg Oral Daily   OLANZapine  5 mg Oral QHS   rosuvastatin  10 mg Oral Daily   acetaminophen, alteplase, alum & mag hydroxide-simeth, fluticasone, heparin, lidocaine (PF), lidocaine-prilocaine, pentafluoroprop-tetrafluoroeth, polyethylene glycol  Assessment/ Plan:  [name]. is a 77 y.o.  male   Principal Problem:   Bipolar depression (HCC)     End Stage Renal Disease on hemodialysis: Continue stable dialysis.  Potassium [facility] vascular lab)  Date Value Ref Range Status  09/28/2021 3.6 3.5 - 5.1 mmol/L Final    Comment:    Performed at [facility], [address] [place], [place] [address]    Intake/Output Summary (Last 24 hours) at 07/31/2022 1225 Last data filed at 07/31/2022 1002 Gross per 24 hour  Intake 240 ml  Output 200 ml  Net 40 ml    2. Hypertension with chronic kidney disease: Will monitor closely.  Continue the amlodipine along with benazepril.   BP 135/82 (BP Location: Right Arm)   Pulse 81   Temp 97.6 F (36.4 C) (Oral)   Resp 16   Ht 5\' 6"  (1.676 m)   Wt 84.1 kg   SpO2 97%   BMI 29.94 kg/m   3. Anemia of chronic kidney disease/ kidney injury/chronic disease/acute blood loss:   Lab Results  Component Value Date   HGB 11.1 (L) 07/31/2022  Will continue to follow anemia protocol.  4. Secondary Hyperparathyroidism:     Lab Results  Component Value Date   CALCIUM 9.3 07/31/2022   CAION 1.17 11/05/2021   PHOS 3.5 07/31/2022   No need for binders at this time.  #5: Depression: Medications reviewed treatment as per psychiatry.  Will continue to monitor.   LOS: 1 Lorain Childes, MD [facility] 5/11/202412:25 PM

## 2022-07-31 NOTE — Plan of Care (Signed)

## 2022-08-01 DIAGNOSIS — F319 Bipolar disorder, unspecified: Secondary | ICD-10-CM

## 2022-08-01 MED ORDER — FLUOXETINE HCL 20 MG PO CAPS
20.0000 mg | ORAL_CAPSULE | Freq: Every day | ORAL | Status: DC
  Administered 2022-08-02 – 2022-08-04 (×3): 20 mg via ORAL
  Filled 2022-08-01 (×3): qty 1

## 2022-08-01 NOTE — Group Note (Signed)
LCSW Group Therapy Note  Group Date: 08/01/2022 Start Time: 1300 End Time: 1330   Type of Therapy and Topic:  Group Therapy - Coping Skills  Participation Level:  Active   Description of Group The focus of this group was to determine what unhealthy coping techniques typically are used by group members and what healthy coping techniques would be helpful in coping with various problems. Patients were guided in becoming aware of the differences between healthy and unhealthy coping techniques. Patients were asked to identify 2-3 healthy coping skills they would like to learn to use more effectively.  Therapeutic Goals Patients learned that coping is what human beings do all day long to deal with various situations in their lives Patients defined and discussed different coping techniques Patients identified their preferred coping techniques and identified coping techniques that have not been affective. Patients determined 2-3 healthy coping skills they would like to become more familiar with and use more often. Patients provided support and ideas to each other   Summary of Patient Progress:  The patient attended group. Today's icebreaker question was would you rather explore the sea or space and the patient chose the sea because he don't want to go into space. The patient stated that the coping skills he uses are dancing, listening to music, and mowing his yard. He stated that he use to was his car but have not lately. The patient stated that he like to do things that keep him busy because he don't like the feeling of waisting time.   Therapeutic Modalities Cognitive Behavioral Therapy Motivational Interviewing  Marshell Levan, Theresia Majors 08/01/2022  2:01 PM
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
## 2022-08-01 NOTE — [facility] Group Notes (Signed)
[facility] Group Notes:  (Nursing/MHT/Case Management/Adjunct)  Date:  08/01/2022  Time:  9:45 AM  Type of Therapy:  Movement Therapy  Participation Level:  Active  Participation Quality:  Appropriate  Affect:  Appropriate  Cognitive:  Appropriate  Insight:  Appropriate  Engagement in Group:  Engaged  Modes of Intervention:  Activity  Summary of Progress/Problems:  Luis Miller 08/01/2022, 9:45 AM

## 2022-08-01 NOTE — Progress Notes (Signed)
Patient resting quietly in bed with eyes closed, Respirations equal and unlabored, skin warm and dry, NAD. Routine safety checks conducted according to facility protocol. Will continue to monitor for safety. 

## 2022-08-01 NOTE — Plan of Care (Signed)
  Problem: Clinical Measurements: Goal: Ability to maintain clinical measurements within normal limits will improve Outcome: Progressing   Problem: Nutrition: Goal: Adequate nutrition will be maintained Outcome: Progressing   Problem: Coping: Goal: Level of anxiety will decrease Outcome: Progressing   
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
## 2022-08-01 NOTE — Progress Notes (Signed)
Patient A&Ox4. Patient is in the dayroom watching tv. Patient denies SI/HI and AVH. Denies any physical complaints when asked. No acute distress noted. Support and encouragement provided. Routine safety checks conducted according to facility protocol and encouraged patient to notify staff if thoughts of harm toward self or others arise. Patient verbalize understanding and agreement. Will continue to monitor for safety.

## 2022-08-01 NOTE — BHH Counselor (Signed)
Adult Comprehensive Assessment  Patient ID: Luis Miller., male   DOB: Nov 02, 1945, 77 y.o.   MRN: [id number]  Information Source: Information source: Patient  Current Stressors:  Patient states their primary concerns and needs for treatment are:: "Not being able to function like I use too, can't concentrate, hard to remember most things, feeling depressed, and not wanting to be around people. Patient states their goals for this hospitilization and ongoing recovery are:: "Not being able to function like I use too, can't concentrate, hard to remember most things, feeling depressed, and not wanting to be around people. 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 Social relationships: None (Patient stated that he has close friends) Substance abuse: None Bereavement / Loss: None  Living/Environment/Situation:  Living Arrangements: Alone Living conditions (as described by patient or guardian): The patient stated that he owns the home and the conditions are fine. How long has patient lived in current situation?: patient stated that he could not remember the number but it has been years. its estimated to be 20+ years What is atmosphere in current home: Comfortable  Family History:  Marital status: Divorced Divorced, when?: Patient stated that it has been several years since divorce. Stated that he was married for 10 years. Does patient have children?:  (Patient stated that he has 2 children a bay and girl.) How is patient's relationship with their children?: The patient stated that he is not so close to his  daughter because she lives in [place], so they dont communicate a lot. Patient stated that he has a good relationship with his son and he came to visit him.  Childhood History:  By whom was/is the patient raised?: Both parents Additional childhood history information: The patient stated that his childhood was fine. Description of patient's relationship with caregiver when they were a child: The patient stated that he has a good relationship with his parents. How were you disciplined when you got in trouble as a child/adolescent?: The patient stated that he was not disciplined very often because he did not do things that would get him into trouble. The patient stated that when he was discipline he might get a spanking by dad but it was not excessive. patient stated that mom did not spank him, she was protectvie. Did patient suffer any verbal/emotional/physical/sexual abuse as a child?: No Did patient suffer from severe childhood neglect?: No Has patient ever been sexually abused/assaulted/raped as an adolescent or adult?: No Was the patient ever a victim of a crime or a disaster?: No Witnessed domestic violence?: No Has patient been affected by domestic violence as an adult?: No  Education:  Highest grade of school patient has completed: "high school diploma" Currently a student?: No Learning disability?: No  Employment/Work Situation:   Employment Situation: Retired Passenger transport manager has Been Impacted by Current Illness: No What is the Longest Time Patient has Held a Job?: 25 years 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, minerals Has Patient ever Been in [employer]?: Yes (Describe in comment) (The patient stated that he was in the [employer] for about 16 days but had to leave due to medical issues.)  Did You Receive Any Psychiatric Treatment/Services While in the Military?: No  Financial Resources:   Financial resources: Receives SSI Does patient have a Lawyer or  guardian?: No  Alcohol/Substance Abuse:   What has been your use of drugs/alcohol within the last 12 months?: No If attempted suicide, did drugs/alcohol play a role in this?: No Alcohol/Substance Abuse Treatment Hx: Denies past history Has alcohol/substance abuse ever caused legal problems?: No  Social Support System:   Patient's Community Support System: Fair Museum/gallery exhibitions officer System: The patient stated that he has friends that he goes dancing with every friday, church and speaking to his pastor, his ex-wife, and his son. Type of faith/religion: [employer]ri[employer] Knuckles How does patient's faith help to cope with current illness?: The patient stated that he can talk to his pastor and he attends church often.  Leisure/Recreation:   Do You Have Hobbies?: Yes Leisure and Hobbies: "Dancing, movies, and eating out".  Strengths/Needs:   What is the patient's perception of their strengths?: Patient stated that he could not thimk of any. Patient states these barriers may affect/interfere with their treatment: The patient stated that he gets medication mixed up. The patient stated that its hard to remember the time and dates he is suppose to take medication. The pastient stated that the names the doctor gives is not whats on the medication bottles. Patient states these barriers may affect their return to the community: none Other important information patient would like considered in planning for their treatment: The patient would like help with medication list, being able to label different perscriptions and the day and time they should be taken.  Discharge Plan:   Currently receiving community mental health services: Yes (From Whom) (The patient stated that he is currently see Dr. Abelina [facility] at [facility] Patient states concerns and preferences for aftercare planning are: Patient is concerned about not being able to function and do the things he use to. Patient states they will know when they  are safe and ready for discharge when: The patient stated that he dont think he will hurt himeself but he is scared to do things on his own. Does patient have access to transportation?: Yes Does patient have financial barriers related to discharge medications?: No Will patient be returning to same living situation after discharge?: Yes  Summary/Recommendations:   Summary and Recommendations (to be completed by the evaluator): The patient is a 77 year old male from [place], [place] [facility]). Who presents to the ER, after he was advised by his outpatient provider. Per the patient, his symptoms of depression have increase. However, per his description, it appears to be anxiety. The patient stated that doesn't like being along and having nothing to do. He is feeling overwhelmed and having trouble focusing and making decisions. The patient stated that he doesn't believe his previous medication was working. The patient stated that he often has trouble remembering date and times to take medication, names of certain medication, and the uses. The patient stated he feels ashamed to ask people for help and don't want them to see him in the hospital. Recommendations include: Crisis stabilization, therapeutic milieu, encourage group attendance and participation, medication management, and development of comprehensive mental wellness.  Marshell Levan. 08/01/2022

## 2022-08-01 NOTE — Progress Notes (Addendum)
Patient is A+O x4. He denies SI/HI/AVH. He endorses depression 8/10. Anxiety 4/10. Appetite good. Adherent to medications. Pain 0/10.  Patient attended group. No PRN's requested. Patient remains on a renal diet with fluid restriction 1200 mL along with strict Intake and Output.  Q15 min unit checks in place.

## 2022-08-02 DIAGNOSIS — F319 Bipolar disorder, unspecified: Secondary | ICD-10-CM | POA: Diagnosis not present

## 2022-08-02 MED ORDER — MIRTAZAPINE 15 MG PO TABS
15.0000 mg | ORAL_TABLET | Freq: Every day | ORAL | Status: DC
  Administered 2022-08-02 – 2022-08-17 (×16): 15 mg via ORAL
  Filled 2022-08-02 (×16): qty 1

## 2022-08-02 MED ORDER — BUPROPION HCL ER (XL) 150 MG PO TB24
150.0000 mg | ORAL_TABLET | Freq: Every day | ORAL | Status: DC
  Administered 2022-08-02 – 2022-08-30 (×29): 150 mg via ORAL
  Filled 2022-08-02 (×29): qty 1

## 2022-08-02 MED ORDER — TRAZODONE HCL 50 MG PO TABS
50.0000 mg | ORAL_TABLET | Freq: Every evening | ORAL | Status: DC | PRN
  Administered 2022-08-03 – 2022-09-04 (×25): 50 mg via ORAL
  Filled 2022-08-02 (×26): qty 1

## 2022-08-02 NOTE — Progress Notes (Signed)
   08/02/22 0716  Psych Admission Type (Psych Patients Only)  Admission Status Voluntary  Psychosocial Assessment  Patient Complaints Depression;Sadness  Eye Contact Fair  Facial Expression Sullen  Affect Depressed;Sullen;Sad  Speech Logical/coherent  Interaction Assertive  Motor Activity Slow  Appearance/Hygiene Unremarkable  Behavior Characteristics Cooperative  Mood Depressed  Thought Process  Coherency WDL  Content WDL  Delusions None reported or observed  Perception WDL  Hallucination None reported or observed  Judgment WDL  Confusion None  Danger to Self  Current suicidal ideation? Denies  Danger to Others  Danger to Others None reported or observed

## 2022-08-02 NOTE — BH IP Treatment Plan (Signed)
Interdisciplinary Treatment and Diagnostic Plan Update  08/02/2022 Time of Session: 10:35AM Luis Miller. MRN: [id number]  Principal Diagnosis: Bipolar depression (HCC)  Secondary Diagnoses: Principal Problem:   Bipolar depression (HCC)   Current Medications:  Current Facility-Administered Medications  Medication Dose Route Frequency Provider Last Rate Last Admin   acetaminophen (TYLENOL) tablet 650 mg  650 mg Oral Q6H PRN Clapacs, Jackquline Denmark, MD       allopurinol (ZYLOPRIM) tablet 100 mg  100 mg Oral BID Clapacs, [name], MD   100 mg at 08/02/22 0857   alteplase (CATHFLO ACTIVASE) injection 2 mg  2 mg Intracatheter Once PRN Lateef, Munsoor, MD       alum & mag hydroxide-simeth (MAALOX/MYLANTA) 200-200-20 MG/5ML suspension 30 mL  30 mL Oral Q4H PRN Clapacs, [name], MD       amLODipine (NORVASC) tablet 10 mg  10 mg Oral Daily Clapacs, Jackquline Denmark, MD   10 mg at 08/02/22 0858   benazepril (LOTENSIN) tablet 40 mg  40 mg Oral Daily Clapacs, Jackquline Denmark, MD   40 mg at 08/02/22 4098   Chlorhexidine Gluconate Cloth 2 % PADS 6 each  6 each Topical Q0600 Lateef, Munsoor, MD       cholecalciferol (VITAMIN D3) 25 MCG (1000 UNIT) tablet 1,000 Units  1,000 Units Oral Daily Clapacs, [name], MD   1,000 Units at 08/02/22 0857   FLUoxetine (PROZAC) capsule 20 mg  20 mg Oral QHS Clapacs, [name], MD       fluticasone (FLONASE) 50 MCG/ACT nasal spray 2 spray  2 spray Each Nare Daily PRN Clapacs, Jackquline Denmark, MD       heparin injection 1,000 Units  1,000 Units Dialysis PRN Lateef, Munsoor, MD       hydrALAZINE (APRESOLINE) tablet 100 mg  100 mg Oral TID Clapacs, [name], MD   100 mg at 08/02/22 0900   lamoTRIgine (LAMICTAL) tablet 25 mg  25 mg Oral Daily Clapacs, [name], MD   25 mg at 08/02/22 0857   lidocaine (PF) (XYLOCAINE) 1 % injection 5 mL  5 mL Intradermal PRN Lateef, Munsoor, MD       lidocaine-prilocaine (EMLA) cream 1 Application  1 Application Topical PRN Lateef, Munsoor, MD       loratadine (CLARITIN) tablet 10  mg  10 mg Oral Daily Clapacs, [name], MD   10 mg at 08/02/22 0857   OLANZapine (ZYPREXA) tablet 5 mg  5 mg Oral QHS Clapacs, [name], MD   5 mg at 08/01/22 2107   pentafluoroprop-tetrafluoroeth (GEBAUERS) aerosol 1 Application  1 Application Topical PRN Lateef, Munsoor, MD       polyethylene glycol (MIRALAX / GLYCOLAX) packet 17 g  17 g Oral Daily PRN Sharen Hones, RPH       rosuvastatin (CRESTOR) tablet 10 mg  10 mg Oral Daily Clapacs, [name], MD   10 mg at 08/02/22 0858   temazepam (RESTORIL) capsule 7.5 mg  7.5 mg Oral QHS PRN Clapacs, [name], MD   7.5 mg at 08/01/22 2107   PTA Medications: Medications Prior to Admission  Medication Sig Dispense Refill Last Dose   allopurinol (ZYLOPRIM) 100 MG tablet Take 100 mg by mouth 2 (two) times daily.      amLODipine (NORVASC) 10 MG tablet TAKE 1 TABLET DAILY 90 tablet 1    benazepril (LOTENSIN) 40 MG tablet Take 40 mg by mouth daily.       calcitRIOL (ROCALTROL) 0.25 MCG capsule Take  0.25 mcg by mouth daily. (Patient not taking: Reported on 07/30/2022)      FLUoxetine (PROZAC) 20 MG capsule Take 1 capsule (20 mg total) by mouth daily. 90 capsule 0    fluticasone (FLONASE) 50 MCG/ACT nasal spray Place 2 sprays into both nostrils daily. 16 g 6    hydrALAZINE (APRESOLINE) 100 MG tablet Take 100 mg by mouth 3 (three) times daily.      lamoTRIgine (LAMICTAL) 25 MG tablet TAKE 1 TABLET DAILY 90 tablet 0    loperamide (IMODIUM) 2 MG capsule Take 2 mg by mouth daily as needed for diarrhea or loose stools.      loratadine (CLARITIN) 10 MG tablet TAKE 1 TABLET BY MOUTH EVERY DAY 90 tablet 1    Multiple Vitamin (MULTIVITAMIN) tablet Take 1 tablet by mouth daily.      OLANZapine (ZYPREXA) 5 MG tablet Take 1 tablet (5 mg total) by mouth at bedtime. 90 tablet 0    rosuvastatin (CRESTOR) 10 MG tablet TAKE 1 TABLET BY MOUTH EVERY DAY 90 tablet 0    Vitamin D, Cholecalciferol, 25 MCG (1000 UT) TABS Take 1,000 tablets by mouth daily.        Patient Stressors:  Medication change or noncompliance    Patient Strengths: Ability for insight  Average or above average intelligence  Capable of independent living  Communication skills  Motivation for treatment/growth  Supportive family/friends   Treatment Modalities: Medication Management, Group therapy, Case management,  1 to 1 session with clinician, Psychoeducation, Recreational therapy.   Physician Treatment Plan for Primary Diagnosis: Bipolar depression (HCC) Long Term Goal(s): Improvement in symptoms so as ready for discharge   Short Term Goals: Compliance with prescribed medications will improve Ability to verbalize feelings will improve  Medication Management: Evaluate patient's response, side effects, and tolerance of medication regimen.  Therapeutic Interventions: 1 to 1 sessions, Unit Group sessions and Medication administration.  Evaluation of Outcomes: Not Met  Physician Treatment Plan for Secondary Diagnosis: Principal Problem:   Bipolar depression (HCC)  Long Term Goal(s): Improvement in symptoms so as ready for discharge   Short Term Goals: Compliance with prescribed medications will improve Ability to verbalize feelings will improve     Medication Management: Evaluate patient's response, side effects, and tolerance of medication regimen.  Therapeutic Interventions: 1 to 1 sessions, Unit Group sessions and Medication administration.  Evaluation of Outcomes: Not Met   RN Treatment Plan for Primary Diagnosis: Bipolar depression (HCC) Long Term Goal(s): Knowledge of disease and therapeutic regimen to maintain health will improve  Short Term Goals: Ability to demonstrate self-control, Ability to participate in decision making will improve, Ability to verbalize feelings will improve, Ability to disclose and discuss suicidal ideas, Ability to identify and develop effective coping behaviors will improve, and Compliance with prescribed medications will improve  Medication  Management: RN will administer medications as ordered by provider, will assess and evaluate patient's response and provide education to patient for prescribed medication. RN will report any adverse and/or side effects to prescribing provider.  Therapeutic Interventions: 1 on 1 counseling sessions, Psychoeducation, Medication administration, Evaluate responses to treatment, Monitor vital signs and CBGs as ordered, Perform/monitor CIWA, COWS, AIMS and Fall Risk screenings as ordered, Perform wound care treatments as ordered.  Evaluation of Outcomes: Not Met   LCSW Treatment Plan for Primary Diagnosis: Bipolar depression (HCC) Long Term Goal(s): Safe transition to appropriate next level of care at discharge, Engage patient in therapeutic group addressing interpersonal concerns.  Short Term Goals: Engage patient  in aftercare planning with referrals and resources, Increase social support, Increase ability to appropriately verbalize feelings, Increase emotional regulation, Facilitate acceptance of mental health diagnosis and concerns, and Increase skills for wellness and recovery  Therapeutic Interventions: Assess for all discharge needs, 1 to 1 time with Social worker, Explore available resources and support systems, Assess for adequacy in community support network, Educate family and significant other(s) on suicide prevention, Complete Psychosocial Assessment, Interpersonal group therapy.  Evaluation of Outcomes: Not Met   Progress in Treatment: Attending groups: No. Participating in groups: No. Taking medication as prescribed: Yes. Toleration medication: Yes. Family/Significant other contact made: Yes, individual(s) contacted:  SPE completed with the patient's ex wife.  Patient understands diagnosis: Yes. Discussing patient identified problems/goals with staff: Yes. Medical problems stabilized or resolved: Yes. Denies suicidal/homicidal ideation: Yes. Issues/concerns per patient  self-inventory: No. Other: none  New problem(s) identified: No, Describe:  none  New Short Term/Long Term Goal(s): medication management for mood stabilization; elimination of SI thoughts; development of comprehensive mental wellness plan.   Patient Goals:  "get out of here and get back to what I was doing before I needed to come here"  Discharge Plan or Barriers: CSW to assist patient in development of appropriate discharge plans.  Patient has a current mental health therapist and reports plans to continue with her.  Patient reports plans to return to his home.   Reason for Continuation of Hospitalization: Anxiety Depression Medication stabilization Suicidal ideation  Estimated Length of Stay:  1-7 days  Last 3 [place] Suicide Severity Risk Score: Flowsheet Row Admission (Current) from 07/30/2022 in [facility] Most recent reading at 07/30/2022  6:00 PM ED from 07/30/2022 in [facility] : [facility] Emergency Department at [facility] Most recent reading at 07/30/2022  3:10 PM Office Visit from 07/26/2022 in confidential department Most recent reading at 07/27/2022 11:27 AM  C-SSRS RISK CATEGORY No Risk No Risk No Risk       Last PHQ 2/9 Scores:    07/26/2022    9:10 AM 02/24/2022    1:14 PM 01/25/2022    1:34 PM  Depression screen PHQ 2/9  Decreased Interest 1 0 0  Down, Depressed, Hopeless 1 0 0  PHQ - 2 Score 2 0 0  Altered sleeping 0 0 0  Tired, decreased energy 0 0 0  Change in appetite 0 0 0  Feeling bad or failure about yourself  1 0 0  Trouble concentrating 0 0 0  Moving slowly or fidgety/restless 0 0 0  Suicidal thoughts 0 0 0  PHQ-9 Score 3 0 0  Difficult doing work/chores  Not difficult at all     Scribe for Treatment Team: Harden Mo, LCSW 08/02/2022 10:43 AM

## 2022-08-02 NOTE — Group Note (Signed)
Date:  08/02/2022 Time:  10:02 AM  Group Topic/Focus:  Self Care:   The focus of this group is to help patients understand the importance of self-care in order to improve or restore emotional, physical, spiritual, interpersonal, and financial health.    Participation Level:  Active  Participation Quality:  Appropriate  Affect:  Appropriate  Cognitive:  Alert and Appropriate  Insight: Appropriate and Good  Engagement in Group:  Engaged  Modes of Intervention:  Discussion  Additional Comments:    Marta Antu 08/02/2022, 10:02 AM

## 2022-08-02 NOTE — Group Note (Signed)
Date:  08/02/2022 Time:  6:40 PM  Group Topic/Focus:  Overcoming Stress:   The focus of this group is to define stress and help patients assess their triggers. Rediscovering Joy:   The focus of this group is to explore various ways to relieve stress in a positive manner. Self Care:   The focus of this group is to help patients understand the importance of self-care in order to improve or restore emotional, physical, spiritual, interpersonal, and financial health.    Participation Level:  Active  Participation Quality:  Appropriate  Affect:  Appropriate  Cognitive:  Alert  Insight: Appropriate  Engagement in Group:  Engaged  Modes of Intervention:  Socialization and Movie  Additional Comments:    Luis Miller l Luis Miller 08/02/2022, 6:40 PM

## 2022-08-02 NOTE — Group Note (Signed)
Recreation Therapy Group Note   Group Topic:Coping Skills  Group Date: 08/02/2022 Start Time: 1400 End Time: 1455 Facilitators: Rosina Lowenstein, LRT, CTRS Location: Courtyard  Group Description: Mind Map.  Patient was provided a blank template of a diagram with 32 blank boxes in a tiered system, branching from the center (similar to a bubble chart). LRT directed patients to label the middle of the diagram "Coping Skills". LRT and patients then came up with 8 different coping skills as examples. Pt were directed to record their coping skills in the 2nd tier boxes closest to the center.  Patients would then share their coping skills with the group as LRT wrote them out. LRT gave a handout of 100 different coping skills at the end of group.   Goal Area(s) Addressed: Patients will be able to define "coping skills". Patient will identify new coping skills.  Patient will identify new possible leisure interests.   Affect/Mood: Appropriate   Participation Level: Active and Engaged   Participation Quality: Independent   Behavior: Appropriate and Calm   Speech/Thought Process: Coherent   Insight: Good   Judgement: Good   Modes of Intervention: Guided Discussion and Worksheet   Patient Response to Interventions:  Attentive, Engaged, Interested , and Receptive   Education Outcome:  Acknowledges education   Clinical Observations/Individualized Feedback: [name] was active in their participation of session activities and group discussion. Pt identified "be around your children, talk to a pastor or watch TV" as coping skills. Pt spontaneously contributed to group discussion while interacting well with peers and LRT duration of session.   Plan: Continue to engage patient in RT group sessions 2-3x/week.   Rosina Lowenstein, LRT, CTRS 08/02/2022 3:12 PM

## 2022-08-02 NOTE — BHH Counselor (Signed)
CSW attempted to review Medicare Rights with the patient.  Patient stated that he does not feel clear enough to process and understand and declined to sign form at this time.   [name], MSW, LCSW 08/02/2022 9:56 AM

## 2022-08-02 NOTE — Progress Notes (Signed)
   08/02/22 0000  Psych Admission Type (Psych Patients Only)  Admission Status Voluntary  Psychosocial Assessment  Patient Complaints Depression  Eye Contact Fair  Facial Expression Sad  Affect Depressed  Speech Logical/coherent  Interaction Assertive  Motor Activity Other (Comment) (WDL)  Appearance/Hygiene Unremarkable  Behavior Characteristics Cooperative  Mood Depressed  Thought Process  Coherency WDL  Content WDL  Delusions None reported or observed  Perception WDL  Hallucination None reported or observed  Judgment WDL  Confusion None  Danger to Self  Current suicidal ideation? Denies  Danger to Others  Danger to Others None reported or observed

## 2022-08-02 NOTE — Plan of Care (Signed)
  Problem: Activity: Goal: Risk for activity intolerance will decrease Outcome: Progressing   Problem: Clinical Measurements: Goal: Ability to maintain clinical measurements within normal limits will improve Outcome: Progressing Goal: Will remain free from infection Outcome: Progressing Goal: Diagnostic test results will improve Outcome: Progressing Goal: Respiratory complications will improve Outcome: Progressing Goal: Cardiovascular complication will be avoided Outcome: Progressing   Problem: Health Behavior/Discharge Planning: Goal: Ability to manage health-related needs will improve Outcome: Progressing   Problem: Education: Goal: Knowledge of General Education information will improve Description: Including pain rating scale, medication(s)/side effects and non-pharmacologic comfort measures Outcome: Progressing   Problem: Nutrition: Goal: Adequate nutrition will be maintained Outcome: Progressing   Problem: Skin Integrity: Goal: Risk for impaired skin integrity will decrease Outcome: Progressing
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
## 2022-08-02 NOTE — Group Note (Signed)
Date:  08/02/2022 Time:  1:30 AM  Group Topic/Focus:  Building Self Esteem:   The Focus of this group is helping patients become aware of the effects of self-esteem on their lives, the things they and others do that enhance or undermine their self-esteem, seeing the relationship between their level of self-esteem and the choices they make and learning ways to enhance self-esteem.    Participation Level:  Active  Participation Quality:  Appropriate  Affect:  Appropriate  Cognitive:  Appropriate  Insight: Appropriate  Engagement in Group:  Engaged  Modes of Intervention:  Support  Additional Comments:    [name] 08/02/2022, 1:30 AM

## 2022-08-02 NOTE — Group Note (Addendum)
LCSW Group Therapy Notes   Date and Time: 08/02/2022 at 1300  Type of Therapy and Topic: Group Therapy: Worry and Anxiety  Participation Level: [facility] PARTICIPATION LEVEL: Active  Description of Group: In this group, patients will be encouraged to explore their worry around what could happen vs what will happen. Each patient will be challenged to think of personal worries and how they will work their way through that worry around what will happen and what could happen. This group will be process-oriented, with patients participating in exploration of their own experiences as well as giving and receiving support and challenge from other group members.  Therapeutic Goals: Patient will identify personal worries that cause anxiety. Patient will identify clues to identify their worry. Patient will identify ways to handle their worry. Patient will discuss ways that their worry has deceased or why it has not decreased.   Summary of Patient Progress: Patient attended group.  Patient was an active participant.  Patient shared that he has been dealing with anxiety.  He reports that when feeling anxiety he feels "worry, unable to function".  He was engage in group discussion on coping skills.  Patient was observed to be practicing the coping skills as discussed.  Patient shared that he has not utilized coping skills int he past because he did not know that he needed to.   Therapeutic Modalities: Cognitive Behavioral Therapy Solution Focused Therapy Motivational Interviewing   Harden Mo, LCSW 08/02/2022  2:12 PM

## 2022-08-03 DIAGNOSIS — N186 End stage renal disease: Secondary | ICD-10-CM | POA: Diagnosis not present

## 2022-08-03 DIAGNOSIS — D631 Anemia in chronic kidney disease: Secondary | ICD-10-CM | POA: Diagnosis not present

## 2022-08-03 DIAGNOSIS — F319 Bipolar disorder, unspecified: Secondary | ICD-10-CM | POA: Diagnosis not present

## 2022-08-03 DIAGNOSIS — I120 Hypertensive chronic kidney disease with stage 5 chronic kidney disease or end stage renal disease: Secondary | ICD-10-CM | POA: Diagnosis not present

## 2022-08-03 DIAGNOSIS — N2581 Secondary hyperparathyroidism of renal origin: Secondary | ICD-10-CM | POA: Diagnosis not present

## 2022-08-03 LAB — HEPATITIS B SURFACE ANTIBODY, QUANTITATIVE: Hep B S AB Quant (Post): 3.8 m[IU]/mL — ABNORMAL LOW (ref >9.9)

## 2022-08-03 MED ORDER — QUETIAPINE FUMARATE 25 MG PO TABS
50.0000 mg | ORAL_TABLET | Freq: Every day | ORAL | Status: DC
  Administered 2022-08-03 – 2022-08-05 (×3): 50 mg via ORAL
  Filled 2022-08-03 (×3): qty 2

## 2022-08-03 NOTE — Group Note (Signed)
Date:  08/03/2022 Time:  11:09 PM  Group Topic/Focus:  Building Self Esteem:   The Focus of this group is helping patients become aware of the effects of self-esteem on their lives, the things they and others do that enhance or undermine their self-esteem, seeing the relationship between their level of self-esteem and the choices they make and learning ways to enhance self-esteem. Coping With Mental Health Crisis:   The purpose of this group is to help patients identify strategies for coping with mental health crisis.  Group discusses possible causes of crisis and ways to manage them effectively. Developing a Wellness Toolbox:   The focus of this group is to help patients develop a "wellness toolbox" with skills and strategies to promote recovery upon discharge. Goals Group:   The focus of this group is to help patients establish daily goals to achieve during treatment and discuss how the patient can incorporate goal setting into their daily lives to aide in recovery. Healthy Communication:   The focus of this group is to discuss communication, barriers to communication, as well as healthy ways to communicate with others. Making Healthy Choices:   The focus of this group is to help patients identify negative/unhealthy choices they were using prior to admission and identify positive/healthier coping strategies to replace them upon discharge. Overcoming Stress:   The focus of this group is to define stress and help patients assess their triggers. Personal Choices and Values:   The focus of this group is to help patients assess and explore the importance of values in their lives, how their values affect their decisions, how they express their values and what opposes their expression. Rediscovering Joy:   The focus of this group is to explore various ways to relieve stress in a positive manner.    Participation Level:  Active  Participation Quality:  Appropriate, Sharing, and Supportive  Affect:   Appropriate  Cognitive:  Alert and Appropriate  Insight: Appropriate, Good, and Improving  Engagement in Group:  Engaged and Improving  Modes of Intervention:  Support  Additional Comments:    [name] 08/03/2022, 11:09 PM

## 2022-08-03 NOTE — Progress Notes (Signed)
   08/03/22 2000  Psych Admission Type (Psych Patients Only)  Admission Status Voluntary  Psychosocial Assessment  Patient Complaints Depression;Shakiness  Eye Contact Fair  Facial Expression Sad  Affect Depressed  Speech Logical/coherent  Interaction Assertive  Motor Activity Slow  Appearance/Hygiene Unremarkable  Behavior Characteristics Cooperative;Calm;Appropriate to situation  Mood Depressed  Thought Process  Coherency WDL  Content WDL  Delusions None reported or observed  Perception WDL  Hallucination None reported or observed  Judgment WDL  Confusion None  Danger to Self  Current suicidal ideation? Denies  Danger to Others  Danger to Others None reported or observed

## 2022-08-03 NOTE — Progress Notes (Signed)
[facility]  ROUNDING NOTE   Subjective:   Seen and examined on hemodialysis.     HEMODIALYSIS FLOWSHEET:  Blood Flow Rate (mL/min): 400 mL/min Arterial Pressure (mmHg): -220 mmHg Venous Pressure (mmHg): 260 mmHg TMP (mmHg): 10 mmHg Ultrafiltration Rate (mL/min): 512 mL/min Dialysate Flow Rate (mL/min): 300 ml/min Dialysis Fluid Bolus: Normal Saline Bolus Amount (mL): 100 mL   Objective:  Vital signs in last 24 hours:  Temp:  [97.6 F (36.4 C)-98.4 F (36.9 C)] 98.4 F (36.9 C) (05/14 1357) Pulse Rate:  [58-77] 74 (05/14 1357) Resp:  [10-17] 16 (05/14 1357) BP: (79-131)/(45-95) 123/77 (05/14 1357) SpO2:  [96 %-100 %] 100 % (05/14 1357) Weight:  [84.2 kg-85 kg] 84.2 kg (05/14 1318)  Weight change: 0.454 kg Filed Weights   08/02/22 0716 08/03/22 0911 08/03/22 1318  Weight: 84.1 kg 85 kg 84.2 kg    Intake/Output: I/O last 3 completed shifts: In: -  Out: 400 [Urine:400]   Intake/Output this shift:  Total I/O In: -  Out: 250 [Urine:250]  Physical Exam: General: NAD, seated in chair  Head: Normocephalic, atraumatic. Moist oral mucosal membranes  Eyes: Anicteric, PERRL  Neck: Supple, trachea midline  Lungs:  Clear to auscultation  Heart: Regular rate and rhythm  Abdomen:  Soft, nontender,   Extremities:  no peripheral edema.  Neurologic: Nonfocal, moving all four extremities  Skin: No lesions  Access: Left AVG    Basic Metabolic Panel: Recent Labs  Lab 07/30/22 1148 07/31/22 1129  NA 141 139  K 3.6 3.6  CL 100 102  CO2 31 27  GLUCOSE 94 110*  BUN 32* 43*  CREATININE 4.66* 5.48*  CALCIUM 9.1 9.3  PHOS  --  3.5    Liver Function Tests: Recent Labs  Lab 07/30/22 1148 07/31/22 1129  AST 26  --   ALT 22  --   ALKPHOS 88  --   BILITOT 0.8  --   PROT 7.4  --   ALBUMIN 4.3 4.2   No results for input(s): "LIPASE", "AMYLASE" in the last 168 hours. No results for input(s): "AMMONIA" in the last 168 hours.  CBC: Recent Labs  Lab  07/30/22 1148 07/31/22 1129  WBC 4.6 4.2  HGB 11.6* 11.1*  HCT 33.2* 32.5*  MCV 100.9* 101.2*  PLT 125* 129*    Cardiac Enzymes: No results for input(s): "CKTOTAL", "CKMB", "CKMBINDEX", "TROPONINI" in the last 168 hours.  BNP: Invalid input(s): "POCBNP"  CBG: No results for input(s): "GLUCAP" in the last 168 hours.  Microbiology: Results for orders placed or performed in visit on 05/12/22  Microscopic Examination     Status: Abnormal   Collection Time: 05/12/22 10:39 AM   Urine  Result Value Ref Range Status   WBC, UA 6-10 (A) 0 - 5 /hpf Final   RBC, Urine 0-2 0 - 2 /hpf Final   Epithelial Cells (non renal) 0-10 0 - 10 /hpf Final   Bacteria, UA Moderate (A) None seen/Few Final    Coagulation Studies: No results for input(s): "LABPROT", "INR" in the last 72 hours.  Urinalysis: No results for input(s): "COLORURINE", "LABSPEC", "PHURINE", "GLUCOSEU", "HGBUR", "BILIRUBINUR", "KETONESUR", "PROTEINUR", "UROBILINOGEN", "NITRITE", "LEUKOCYTESUR" in the last 72 hours.  Invalid input(s): "APPERANCEUR"    Imaging: No results found.   Medications:     allopurinol  100 mg Oral BID   amLODipine  10 mg Oral Daily   benazepril  40 mg Oral Daily   buPROPion  150 mg Oral Daily   cholecalciferol  1,000 Units Oral Daily   FLUoxetine  20 mg Oral QHS   hydrALAZINE  100 mg Oral TID   loratadine  10 mg Oral Daily   mirtazapine  15 mg Oral QHS   rosuvastatin  10 mg Oral Daily   acetaminophen, alteplase, alum & mag hydroxide-simeth, fluticasone, heparin, lidocaine (PF), lidocaine-prilocaine, pentafluoroprop-tetrafluoroeth, polyethylene glycol, traZODone  Assessment/ Plan:  [name]. is a 77 y.o. white male with end stage renal disease on hemodialysis, hypertension, gout, depression, history of melanoma, crohn's, and CVA who presents to [facility] on 07/30/2022 for Bipolar depression (HCC) [F31.9]  [facility] TTS [address] Left AVG 87kg  End Stage Renal Disease on  hemodialysis: tolerating treatment well. Continue TTS schedule  Anemia with chronic kidney disease: macrocytic. Hemoglobin at goal. Continue Mircera as outpatient.   Hypertension with chronic kidney disease: 114/68 on dialysis treatment. Continue current regimen of amlodipine, benazepril, and hydralazine.   Secondary Hyperparathyroidism: outpatient PTH of 456. Not currently on phos binders or vitamin D agents.    LOS: 4 Bleu Minerd 5/14/20242:08 PM

## 2022-08-03 NOTE — Progress Notes (Signed)
Received patient in bed to unit.    Informed consent signed and in chart.    TX duration:3.5     Transported By hospital transport tp Spec Hand-off given to patient's nurse.    Access used: L AVF Access issues: none   Total UF removed: 0.8 Medication(s) given:None Post HD VS: VSS Post HD weight: 84.2    [facility]

## 2022-08-03 NOTE — [facility] Group Notes (Signed)
[facility] Group Notes:  (Nursing/MHT/Case Management/Adjunct)  Date:  08/03/2022  Time:  1:08 PM  Type of Therapy:  Movement Therapy  Participation Level:  Did Not Attend    [name] 08/03/2022, 1:08 PM

## 2022-08-03 NOTE — BH Assessment (Signed)
Recreation Therapy Notes  INPATIENT RECREATION THERAPY ASSESSMENT  Patient Details Name: Luis Miller. MRN: [id number] DOB: October 31, 1945 Today's Date: 08/03/2022       Information Obtained From: Patient (In addition to chart review)  Able to Participate in Assessment/Interview:  Yes  Patient Presentation: Responsive  Reason for Admission (Per Patient): Active Symptoms ("I was having difficulty managing my check book at home. I did not go dancing on Friday like I have been for years due to being afraid to drive there. I don't like talking to people because I am forgetful and hard of hearing.")  Patient Stressors:  ("I have to take my driving test in August and I don't think I will pass it and be able to drive anymore. I had $4,000 hearing aids but retutned them becuase they werent working for me and did not help my hearing at all")  Coping Skills:   Avoidance  Leisure Interests (2+):  Social - Family, Sports - Dance, Music - Listen ("I go dancing evey Friday at the senior center in [place]. We all bring a dish to eat and they have a band playing live music. I like to visit my sister who is in a nursing home a couple blocks away every Sunday.")  Frequency of Recreation/Participation: Weekly  Awareness of Community Resources:  Yes  Community Resources:  [employer], Public affairs consultant  Current Use: Yes  Expressed Interest in [employer] Information: No  County of Residence:  [place], [place])  Patient Main Form of Transportation: Car  Patient Strengths:  "Mechanical things...working on things and fixing stuff. I work on my car or plumbing."  Patient Identified Areas of Improvement:  "Communicate and talk with people."  Patient Goal for Hospitalization:  "Talk to people more and not be a turtle in my shell."  Current SI (including self-harm):  No  Current HI:  No  Current AVH: No  Staff Intervention Plan: Group Attendance, Collaborate  with Interdisciplinary Treatment Team  Consent to Intern Participation: N/A  Patient shared that he is a big Elvis fan and has a lot of his memorabilia at home. His favorite restaurant is the [employer]. He prefers slow dancing with a partner over any other type of dancing. Pt shared that he gets overwhelmed with all the different types of medications and what he has or has not been on before. Pt shared that since being in the hospital, he has been able to think of many different stressors in his life than he did before he came to the hospital. Pt mentioned that he is hard of hearing multiple times throughout the assessment and that is causes him to have social anxiety. Pt shared that he is wanting to look into getting new hearing aids once he discharges but did not want to bring any into the hospital in fear that he may lose them or that they will get lost in general.  [employer] LRT, [address] E Lorimer Tiberio 08/03/2022, 3:27 PM

## 2022-08-03 NOTE — Progress Notes (Signed)
Patient is A/O x 4.  He denied SI, Hi, delusions, ans hallucinations.  No s/sx. of acute distress.  He reported having continued depression, and "I guess it's anxiety - my ex-wife said I was anxious".  He spoke in depth with this Clinical research associate about his psych hx.  He dated his depression back to age 77.  He was able to acknowledge different coping skills ie: dancing on Friday night, & eating out at [facility].  He took his medications after this Clinical research associate discussed the names, and why they were to be given at the indicated times.  No adverse reactions.  He requested "something for sleep" @ 0020.  He was given PRN Trazodone as ordered.  He rested the remainder of the sleep in bed with his CPAP in place. His respirations were even on unlabored.    Q38m safety checks and Plan of Care continued.

## 2022-08-03 NOTE — Progress Notes (Signed)
Patient off unit to dialysis.

## 2022-08-03 NOTE — Group Note (Signed)
Recreation Therapy Group Note   Group Topic:Self-Esteem  Group Date: 08/03/2022 Start Time: 1400 End Time: 1455 Facilitators: Rosina Lowenstein, LRT, CTRS Location:  Dayroom  Group Description: Positive Focus. Patients are given a handout that has 9 different boxes on it. Each box has a different prompt on it that requires you to identify and add something positive about themselves. LRT encourages pts to share at least two of their boxes to the group. LRT and pts discuss the importance of "thinking positive", self-esteem and how they can apply it to their everyday life post-discharge. After completing worksheet, patients played Positive Affirmation Bingo and won stress balls, sudoku, or word search books as prizes.   Goal Area(s) Addressed: Patient will identify positive qualities about themselves. Patient will identify definition of "self-esteem". Patients will identify the difference between positive and negative self-esteem.  Patient will recite positive qualities and affirmations aloud to the group.  Affect/Mood: Appropriate and Flat   Participation Level: Active and Engaged   Participation Quality: Independent   Behavior: Calm and Cooperative   Speech/Thought Process: Coherent   Insight: Good   Judgement: Good   Modes of Intervention: Cooperative Play and Worksheet   Patient Response to Interventions:  Attentive, Engaged, Interested , and Receptive   Education Outcome:  Acknowledges education   Clinical Observations/Individualized Feedback: [name] was active in their participation of session activities and group discussion. Pt identified "A positive thing that I do well is take care of myself. A way I stay healthy and take care of myself is eating properly. A positive way I cope with stress is music and a positive way I communicate with others is on the telephone." Pt won a game of bingo and chose a stress ball as his prize. Pt was soft spoken, however interacted well with  peers and LRT duration of session.   Plan: Continue to engage patient in RT group sessions 2-3x/week.   Rosina Lowenstein, LRT, CTRS 08/03/2022 3:12 PM

## 2022-08-03 NOTE — BHH Group Notes (Signed)
B[facility] Group Notes:  (Nursing/MHT/Case Management/Adjunct)  Date:  08/03/2022  Time:  10:27 AM  Type of Therapy:   Meditation therapy  Participation Level:  Did Not Attend    [name] 08/03/2022, 10:27 AM

## 2022-08-03 NOTE — Plan of Care (Signed)
New Goal as of 08/03/22  Problem: Communication Goal: STG - Patient will demonstrate improved communication skills by spontaneously contributing to 2 group discussions within 5 recreation therapy group sessions Description: STG - Patient will demonstrate improved communication skills by spontaneously contributing to 2 group discussions within 5 recreation therapy group sessions Outcome: Not Applicable
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
## 2022-08-03 NOTE — Progress Notes (Signed)
   08/03/22 1351  Psych Admission Type (Psych Patients Only)  Admission Status Voluntary  Psychosocial Assessment  Patient Complaints Depression  Eye Contact Fair  Facial Expression Sad  Affect Depressed  Speech Logical/coherent  Interaction Assertive  Motor Activity Slow  Appearance/Hygiene Unremarkable  Behavior Characteristics Cooperative;Calm  Mood Sullen  Thought Process  Coherency WDL  Content WDL  Delusions None reported or observed  Perception WDL  Hallucination None reported or observed  Judgment WDL  Confusion None  Danger to Self  Current suicidal ideation? Denies  Danger to Others  Danger to Others None reported or observed

## 2022-08-04 DIAGNOSIS — F319 Bipolar disorder, unspecified: Secondary | ICD-10-CM | POA: Diagnosis not present

## 2022-08-04 MED ORDER — TRAZODONE HCL 50 MG PO TABS
50.0000 mg | ORAL_TABLET | Freq: Once | ORAL | Status: AC
  Administered 2022-08-04: 50 mg via ORAL

## 2022-08-04 NOTE — Group Note (Signed)
[facility] LCSW Group Therapy Note   Group Date: 08/04/2022 Start Time: 1300 End Time: 1400   Type of Therapy/Topic:  Group Therapy:  Emotion Regulation  Participation Level:  Active   Mood:  Description of Group:    The purpose of this group is to assist patients in learning to regulate negative emotions and experience positive emotions. Patients will be guided to discuss ways in which they have been vulnerable to their negative emotions. These vulnerabilities will be juxtaposed with experiences of positive emotions or situations, and patients challenged to use positive emotions to combat negative ones. Special emphasis will be placed on coping with negative emotions in conflict situations, and patients will process healthy conflict resolution skills.  Therapeutic Goals: Patient will identify two positive emotions or experiences to reflect on in order to balance out negative emotions:  Patient will label two or more emotions that they find the most difficult to experience:  Patient will be able to demonstrate positive conflict resolution skills through discussion or role plays:   Summary of Patient Progress: Patient was present in group.  Patient was an active participant and was engaged with others.  Patient reports that when he thinks of emotion regulation he thinks "taking care of your emotions".  He reports that he was feeling "worry" prior to and since his admission.  He was able to identify appropriate ways to respond to negative emotions in a negative and positive way.  He was engaged in discussion on coping skills but was unable to identify any that he can do.  Patient continues to report being unable to think clearly.  He reports that he doesn't know what is bothering him and it's everything.  He is unable to identify how CSW can be of assistance.   Therapeutic Modalities:   Cognitive Behavioral Therapy Feelings Identification Dialectical Behavioral Therapy   Harden Mo,  LCSW

## 2022-08-04 NOTE — Progress Notes (Signed)
D: Patient alert and oriented times 4. Pt denies SI, HI, AVH. Pt denies pain. Pt endorses anxiety and depression. No other concerns noted at this time.   A: Pt provided support and encouragement throughout the day. Scheduled medications given as prescribed. Takes medications whole without issue.    R: Pt remains safe on the unit with Q15 min safety checks in place. Will continue to monitor for changes.  

## 2022-08-04 NOTE — Progress Notes (Signed)
Patient approaches nursing station and reports, "I am still not sleeping, I am just lying there I haven't been able to get a good nights rest in forever. I just keep tossing and turning." Annice Pih NP notified of patient's reports. NP to placed 1x order of STAT trazodone.

## 2022-08-04 NOTE — Plan of Care (Signed)

## 2022-08-04 NOTE — Progress Notes (Signed)
   08/04/22 2100  Psych Admission Type (Psych Patients Only)  Admission Status Voluntary  Psychosocial Assessment  Patient Complaints Sleep disturbance;Depression  Eye Contact Fair  Facial Expression Sad  Affect Depressed  Speech Logical/coherent  Interaction Assertive  Motor Activity Slow  Appearance/Hygiene Unremarkable  Behavior Characteristics Cooperative;Appropriate to situation;Calm  Mood Depressed  Thought Process  Coherency WDL  Content WDL  Delusions None reported or observed  Perception WDL  Hallucination None reported or observed  Judgment WDL  Confusion None  Danger to Self  Current suicidal ideation? Denies  Danger to Others  Danger to Others None reported or observed

## 2022-08-04 NOTE — Group Note (Signed)
Date:  08/04/2022 Time:  10:45 AM  Group Topic/Focus:  Managing Feelings:   The focus of this group is to identify what feelings patients have difficulty handling and develop a plan to handle them in a healthier way upon discharge.    Participation Level:  Minimal  Participation Quality:  Drowsy and Resistant  Affect:  Depressed  Cognitive:  Appropriate  Insight: Good  Engagement in Group:  Limited  Modes of Intervention:  Discussion and Problem-solving  Additional Comments:    Leonie Green 08/04/2022, 10:45 AM
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
## 2022-08-04 NOTE — Group Note (Signed)
Recreation Therapy Group Note   Group Topic:Relaxation  Group Date: 08/04/2022 Start Time: 1400 End Time: 1450 Facilitators: Rosina Lowenstein, LRT, CTRS Location:  Dayroom and Courtyard   Group Description: PMR (Progressive Muscle Relaxation). LRT asks patients their current level of stress/anxiety from 1-10, with 10 being the highest. LRT educates patients on what PMR is and the benefits that come from it. Patients are asked to sit with their feet flat on the floor while sitting up and all the way back in their chair, if possible. LRT and pts follow a prompt through a speaker that requires you to tense and release different muscles in their body and focus on their breathing. During session, lights are off and soft music is being played. At the end of the prompt, LRT asks patients to rank their current levels of stress/anxiety from 1-10, 10 being the highest.   Goal Area(s) Addressed:  Patients will be able to describe progressive muscle relaxation.  Patient will practice using relaxation technique. Patient will identify a new coping skill.  Patient will follow multistep directions to reduce anxiety and stress.  Affect/Mood: Appropriate and Flat   Participation Level: Active and Engaged   Participation Quality: Independent   Behavior: Appropriate, Calm, and Cooperative   Speech/Thought Process: Coherent   Insight: Good   Judgement: Good   Modes of Intervention: Activity   Patient Response to Interventions:  Attentive, Engaged, Interested , and Receptive   Education Outcome:  Acknowledges education   Clinical Observations/Individualized Feedback: Luis Miller was active in their participation of session activities and group discussion. Pt identified that his stress and anxiety levels were a 5 or 6 before the session. After, he shared that his stress and anxiety levels were a 5 of 4. Pt shared that he found the exercise to be "relaxing". Pt shared that he was familiar with PMR and has  done it at a different hospital. After the PMR session, LRT and peers went outside for fresh air and sunlight.    Plan: Continue to engage patient in RT group sessions 2-3x/week.   Rosina Lowenstein, LRT, CTRS 08/04/2022 2:55 PM

## 2022-08-04 NOTE — Progress Notes (Signed)
   08/04/22 0557  15 Minute Checks  Location Bedroom  Visual Appearance Calm  Behavior Composed  Sleep (Behavioral Health Patients Only)  Calculate sleep? (Click Yes once per 24 hr at 0600 safety check) Yes  Documented sleep last 24 hours 7.5

## 2022-08-04 NOTE — Progress Notes (Signed)
   08/04/22 0709  Psych Admission Type (Psych Patients Only)  Admission Status Voluntary  Psychosocial Assessment  Patient Complaints Depression  Eye Contact Fair  Facial Expression Sad  Affect Depressed  Speech Logical/coherent  Interaction Assertive  Motor Activity Slow  Appearance/Hygiene Unremarkable  Behavior Characteristics Calm  Mood Depressed  Thought Process  Coherency WDL  Content WDL  Delusions None reported or observed  Perception WDL  Hallucination None reported or observed  Judgment WDL  Confusion None  Danger to Self  Current suicidal ideation? Denies  Danger to Others  Danger to Others None reported or observed

## 2022-08-05 DIAGNOSIS — N186 End stage renal disease: Secondary | ICD-10-CM | POA: Diagnosis not present

## 2022-08-05 DIAGNOSIS — I959 Hypotension, unspecified: Secondary | ICD-10-CM | POA: Diagnosis not present

## 2022-08-05 DIAGNOSIS — D631 Anemia in chronic kidney disease: Secondary | ICD-10-CM | POA: Diagnosis not present

## 2022-08-05 DIAGNOSIS — F319 Bipolar disorder, unspecified: Secondary | ICD-10-CM | POA: Diagnosis not present

## 2022-08-05 DIAGNOSIS — N2581 Secondary hyperparathyroidism of renal origin: Secondary | ICD-10-CM | POA: Diagnosis not present

## 2022-08-05 LAB — CBC
HCT: 30.6 % — ABNORMAL LOW (ref 39.0–52.0)
Hemoglobin: 10.7 g/dL — ABNORMAL LOW (ref 13.0–17.0)
MCH: 34.9 pg — ABNORMAL HIGH (ref 26.0–34.0)
MCHC: 35 g/dL (ref 30.0–36.0)
MCV: 99.7 fL (ref 80.0–100.0)
Platelets: 125 10*3/uL — ABNORMAL LOW (ref 150–400)
RBC: 3.07 MIL/uL — ABNORMAL LOW (ref 4.22–5.81)
RDW: 12.8 % (ref 11.5–15.5)
WBC: 4.8 10*3/uL (ref 4.0–10.5)
nRBC: 0 % (ref 0.0–0.2)

## 2022-08-05 LAB — RENAL FUNCTION PANEL
Albumin: 3.9 g/dL (ref 3.5–5.0)
Anion gap: 15 (ref 5–15)
BUN: 80 mg/dL — ABNORMAL HIGH (ref 8–23)
CO2: 24 mmol/L (ref 22–32)
Calcium: 9 mg/dL (ref 8.9–10.3)
Chloride: 94 mmol/L — ABNORMAL LOW (ref 98–111)
Creatinine, Ser: 6.43 mg/dL — ABNORMAL HIGH (ref 0.61–1.24)
GFR, Estimated: 8 mL/min — ABNORMAL LOW (ref >60)
Glucose, Bld: 196 mg/dL — ABNORMAL HIGH (ref 70–99)
Phosphorus: 5.7 mg/dL — ABNORMAL HIGH (ref 2.5–4.6)
Potassium: 3.1 mmol/L — ABNORMAL LOW (ref 3.5–5.1)
Sodium: 133 mmol/L — ABNORMAL LOW (ref 135–145)

## 2022-08-05 MED ORDER — POTASSIUM CHLORIDE CRYS ER 10 MEQ PO TBCR
10.0000 meq | EXTENDED_RELEASE_TABLET | Freq: Every day | ORAL | Status: DC
  Administered 2022-08-05 – 2022-09-25 (×52): 10 meq via ORAL
  Filled 2022-08-05 (×52): qty 1

## 2022-08-05 NOTE — Progress Notes (Signed)
   08/05/22 2300  Psych Admission Type (Psych Patients Only)  Admission Status Voluntary  Psychosocial Assessment  Patient Complaints Depression  Eye Contact Fair  Facial Expression Flat  Affect Appropriate to circumstance;Depressed  Speech Logical/coherent  Interaction Assertive  Motor Activity Slow  Appearance/Hygiene Unremarkable  Behavior Characteristics Cooperative;Appropriate to situation  Mood Depressed  Thought Process  Coherency WDL  Content WDL  Delusions None reported or observed  Perception WDL  Hallucination None reported or observed  Judgment WDL  Confusion None  Danger to Self  Current suicidal ideation? Denies  Danger to Others  Danger to Others None reported or observed
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
## 2022-08-05 NOTE — Group Note (Signed)
Date:  08/05/2022 Time:  5:50 PM  Group Topic/Focus:  MUSIC RELAXATION    Participation Level:  Active  Participation Quality:  Attentive  Affect:  Appropriate  Cognitive:  Alert  Insight: Good  Engagement in Group:  Engaged  Modes of Intervention:  Socialization  Additional Comments:    Luane School 08/05/2022, 5:50 PM

## 2022-08-05 NOTE — Progress Notes (Signed)
[facility]  ROUNDING NOTE   Subjective:   Seen and examined on hemodialysis. Tolerating treatment well.     HEMODIALYSIS FLOWSHEET:  Blood Flow Rate (mL/min): 400 mL/min Arterial Pressure (mmHg): -220 mmHg Venous Pressure (mmHg): 270 mmHg TMP (mmHg): 19 mmHg Ultrafiltration Rate (mL/min): 994 mL/min Dialysate Flow Rate (mL/min): 300 ml/min Dialysis Fluid Bolus: Normal Saline Bolus Amount (mL): 100 mL   Objective:  Vital signs in last 24 hours:  Temp:  [97.7 F (36.5 C)-98.2 F (36.8 C)] 97.7 F (36.5 C) (05/16 0825) Pulse Rate:  [56-96] 56 (05/16 1100) Resp:  [14-20] 14 (05/16 1100) BP: (87-115)/(50-67) 94/60 (05/16 1100) SpO2:  [97 %-100 %] 100 % (05/16 1100) Weight:  [85.1 kg] 85.1 kg (05/16 0824)  Weight change:  Filed Weights   08/03/22 0911 08/03/22 1318 08/05/22 0824  Weight: 85 kg 84.2 kg 85.1 kg    Intake/Output: I/O last 3 completed shifts: In: -  Out: 650 [Urine:650]   Intake/Output this shift:  No intake/output data recorded.  Physical Exam: General: NAD, seated in chair  Head: Normocephalic, atraumatic. Moist oral mucosal membranes  Eyes: Anicteric, PERRL  Neck: Supple, trachea midline  Lungs:  Clear to auscultation  Heart: Regular rate and rhythm  Abdomen:  Soft, nontender,   Extremities:  no peripheral edema.  Neurologic: Nonfocal, moving all four extremities  Skin: No lesions  Access: Left AVG    Basic Metabolic Panel: Recent Labs  Lab 07/30/22 1148 07/31/22 1129 08/05/22 0900  NA 141 139 133*  K 3.6 3.6 3.1*  CL 100 102 94*  CO2 31 27 24   GLUCOSE 94 110* 196*  BUN 32* 43* 80*  CREATININE 4.66* 5.48* 6.43*  CALCIUM 9.1 9.3 9.0  PHOS  --  3.5 5.7*     Liver Function Tests: Recent Labs  Lab 07/30/22 1148 07/31/22 1129 08/05/22 0900  AST 26  --   --   ALT 22  --   --   ALKPHOS 88  --   --   BILITOT 0.8  --   --   PROT 7.4  --   --   ALBUMIN 4.3 4.2 3.9    No results for input(s): "LIPASE", "AMYLASE"  in the last 168 hours. No results for input(s): "AMMONIA" in the last 168 hours.  CBC: Recent Labs  Lab 07/30/22 1148 07/31/22 1129 08/05/22 0900  WBC 4.6 4.2 4.8  HGB 11.6* 11.1* 10.7*  HCT 33.2* 32.5* 30.6*  MCV 100.9* 101.2* 99.7  PLT 125* 129* 125*     Cardiac Enzymes: No results for input(s): "CKTOTAL", "CKMB", "CKMBINDEX", "TROPONINI" in the last 168 hours.  BNP: Invalid input(s): "POCBNP"  CBG: No results for input(s): "GLUCAP" in the last 168 hours.  Microbiology: Results for orders placed or performed in visit on 05/12/22  Microscopic Examination     Status: Abnormal   Collection Time: 05/12/22 10:39 AM   Urine  Result Value Ref Range Status   WBC, UA 6-10 (A) 0 - 5 /hpf Final   RBC, Urine 0-2 0 - 2 /hpf Final   Epithelial Cells (non renal) 0-10 0 - 10 /hpf Final   Bacteria, UA Moderate (A) None seen/Few Final    Coagulation Studies: No results for input(s): "LABPROT", "INR" in the last 72 hours.  Urinalysis: No results for input(s): "COLORURINE", "LABSPEC", "PHURINE", "GLUCOSEU", "HGBUR", "BILIRUBINUR", "KETONESUR", "PROTEINUR", "UROBILINOGEN", "NITRITE", "LEUKOCYTESUR" in the last 72 hours.  Invalid input(s): "APPERANCEUR"    Imaging: No results found.   Medications:  allopurinol  100 mg Oral BID   amLODipine  10 mg Oral Daily   benazepril  40 mg Oral Daily   buPROPion  150 mg Oral Daily   cholecalciferol  1,000 Units Oral Daily   FLUoxetine  20 mg Oral QHS   hydrALAZINE  100 mg Oral TID   loratadine  10 mg Oral Daily   mirtazapine  15 mg Oral QHS   QUEtiapine  50 mg Oral QHS   rosuvastatin  10 mg Oral Daily   acetaminophen, alteplase, alum & mag hydroxide-simeth, fluticasone, heparin, lidocaine (PF), lidocaine-prilocaine, pentafluoroprop-tetrafluoroeth, polyethylene glycol, traZODone  Assessment/ Plan:  [name]. is a 77 y.o. white male with end stage renal disease on hemodialysis, hypertension, gout, depression, history  of melanoma, crohn's, and CVA who presents to [facility] on 07/30/2022 for Bipolar depression (HCC) [F31.9]  [facility] TTS [address] Left AVG 87kg  End Stage Renal Disease on hemodialysis: tolerating treatment well. Continue TTS schedule  Anemia with chronic kidney disease: macrocytic. Hemoglobin at goal. Continue Mircera as outpatient.   Hypertension with chronic kidney disease: Hypotensive on treatment today. Current regimen of amlodipine, benazepril, and hydralazine. Patient did not get his medications this morning prior to dialysis. - hold patient's hydralazine  Secondary Hyperparathyroidism: outpatient PTH of 456. Not currently on phos binders or vitamin D agents.    LOS: 6 Luis Miller 5/16/202411:32 AM

## 2022-08-05 NOTE — Progress Notes (Signed)
   08/05/22 0553  15 Minute Checks  Location Bedroom  Visual Appearance Sad;Calm  Behavior Composed  Sleep (Behavioral Health Patients Only)  Calculate sleep? (Click Yes once per 24 hr at 0600 safety check) Yes  Documented sleep last 24 hours 6.75

## 2022-08-05 NOTE — Progress Notes (Signed)
Hemodialysis note  Received patient in bed to unit. Alert and oriented.  Informed consent signed and in chart.  Treatment initiated: 1610 Treatment completed: 1234  Patient tolerated well. Transported back to room, alert without acute distress.  Report given to patient's RN.   Access used: Left upper arm AVG Access issues: none  Total UF removed: 2L Medication(s) given:  none  Post HD weight: 83.3 kg   Luis Miller [facility]

## 2022-08-05 NOTE — Group Note (Signed)
Recreation Therapy Group Note   Group Topic:Communication  Group Date: 08/05/2022 Start Time: 1400 End Time: 1445 Facilitators: Rosina Lowenstein, LRT, CTRS Location: Courtyard  Group Description: Reminisce Cards. Patients drew a laminated card out of a bag that had a word or phrase on it. Pt encouraged to speak about a time in their life or fond memory that specifically relates to the word they chose out of the bag. An example would be: "parenthood, meals, siblings, travel, or home".  LRT prompted following questions and encouraged contribution from peers to increase communication.   Goal Area(s) Addressed: Patient will increase verbal communication by conversing with peers. Patient will contribute to group discussion with minimal prompting. Patient will reminisce a positive memory or moment in their life.   Affect/Mood: Appropriate and Flat   Participation Level: Active and Engaged   Participation Quality: Independent   Behavior: Calm and Cooperative   Speech/Thought Process: Coherent   Insight: Good   Judgement: Good   Modes of Intervention: Guided Discussion   Patient Response to Interventions:  Attentive, Engaged, Interested , and Receptive   Education Outcome:  Acknowledges education   Clinical Observations/Individualized Feedback: Luis Miller was active in their participation of session activities and group discussion. Pt identified "I have 3 great grand children and 2 grand kids. A special memory of mine is going to see Elvis in concert at the [employer] 3 times when he was alive." Pt interacted well with LRT and peers duration of session. Pt was observed asking a peer questions during group.   Plan: Continue to engage patient in RT group sessions 2-3x/week.   Rosina Lowenstein, LRT, CTRS 08/05/2022 2:51 PM

## 2022-08-05 NOTE — Group Note (Signed)
[facility] LCSW Group Therapy Note    Group Date: 08/05/2022 Start Time: 1000 End Time: 1100  Type of Therapy and Topic:  Group Therapy:  Overcoming Obstacles  Participation Level:  [facility] PARTICIPATION LEVEL: Did Not Attend  Mood:  Description of Group:   In this group patients will be encouraged to explore what they see as obstacles to their own wellness and recovery. They will be guided to discuss their thoughts, feelings, and behaviors related to these obstacles. The group will process together ways to cope with barriers, with attention given to specific choices patients can make. Each patient will be challenged to identify changes they are motivated to make in order to overcome their obstacles. This group will be process-oriented, with patients participating in exploration of their own experiences as well as giving and receiving support and challenge from other group members.  Therapeutic Goals: 1. Patient will identify personal and current obstacles as they relate to admission. 2. Patient will identify barriers that currently interfere with their wellness or overcoming obstacles.  3. Patient will identify feelings, thought process and behaviors related to these barriers. 4. Patient will identify two changes they are willing to make to overcome these obstacles:    Summary of Patient Progress Patient at dialysis.     Therapeutic Modalities:   Cognitive Behavioral Therapy Solution Focused Therapy Motivational Interviewing Relapse Prevention Therapy   Harden Mo, LCSW

## 2022-08-05 NOTE — Progress Notes (Signed)
Patient alert and oriented x 4.  Patient denies SI,HI and AVH.  Endorses anxiety and depression. Denies pain.  Patient compliant with scheduled medications, but stated he was unsure what any of them were.  RN explained each medication prior to administering.  15 min checks in place for safety.  Patient's BP soft after dialysis - (101/56) BP medications held. Other medications given as ordered.  Patient given lunch tray.   Patient's BP  74/50.  Dr. Marlou Porch made aware.  Patient encouraged to increase fluid intake.    BP re-checked:  92/58  Patient left for dialysis at 0820. Patient returned to unit at 1303.

## 2022-08-05 NOTE — Plan of Care (Signed)
  Problem: Education: Goal: Knowledge of General Education information will improve Description: Including pain rating scale, medication(s)/side effects and non-pharmacologic comfort measures Outcome: Progressing   Problem: Activity: Goal: Risk for activity intolerance will decrease Outcome: Progressing   Problem: Nutrition: Goal: Adequate nutrition will be maintained Outcome: Progressing   Problem: Coping: Goal: Level of anxiety will decrease Outcome: Progressing   

## 2022-08-06 DIAGNOSIS — F319 Bipolar disorder, unspecified: Secondary | ICD-10-CM | POA: Diagnosis not present

## 2022-08-06 MED ORDER — ARIPIPRAZOLE 2 MG PO TABS
2.0000 mg | ORAL_TABLET | Freq: Every day | ORAL | Status: DC
  Administered 2022-08-06 – 2022-08-09 (×4): 2 mg via ORAL
  Filled 2022-08-06 (×3): qty 1

## 2022-08-06 MED ORDER — DOXEPIN HCL 50 MG PO CAPS
50.0000 mg | ORAL_CAPSULE | Freq: Every day | ORAL | Status: DC
  Administered 2022-08-06 – 2022-08-17 (×12): 50 mg via ORAL
  Filled 2022-08-06 (×14): qty 1

## 2022-08-06 MED ORDER — FUROSEMIDE 20 MG PO TABS
20.0000 mg | ORAL_TABLET | Freq: Once | ORAL | Status: AC
  Administered 2022-08-06: 20 mg via ORAL
  Filled 2022-08-06: qty 1

## 2022-08-06 NOTE — Group Note (Signed)
Date:  08/06/2022 Time:  10:09 AM  Group Topic/Focus:  Open Craft and Music Therapy. Patients used art supplies to color while listening to soothing music.      Participation Level:  Active  Participation Quality:  Appropriate  Affect:  Appropriate  Cognitive:  Alert and Appropriate  Insight: Appropriate  Engagement in Group:  Engaged  Modes of Intervention:  Activity  Additional Comments:    [name] T [name] 08/06/2022, 10:09 AM  

## 2022-08-06 NOTE — Progress Notes (Signed)
Patient with worried, sad affect.  Patient states he is not doing well and does not think he is going to get better. "Do I have to get worse before I get better?"   Patient endorses anxiety and depression.  Denies SI,HI and AVH.  Patient is unable to tell this writer if he slept well last night.  Compliant with daily medications.  15 min checks in place for safety.  Daily weight documented.    Patient present in the milieu and participated in groups.  Appropriate interaction with peers.

## 2022-08-06 NOTE — Discharge Planning (Signed)
ESTABLISHED HEMODIALYSIS Outpatient Facility: [facility]  [address] [place], [place] [address] [phone]  Scheduled days: Tuesday Thursday and Saturday Treatment time: 10:30am

## 2022-08-06 NOTE — Progress Notes (Signed)
No urine output noted.  Verified with patient that he has not urinated today.  Dr. Marlou Porch made aware. Lasix will be administered as ordered.

## 2022-08-06 NOTE — Progress Notes (Addendum)
Patient's output measured only 100 mL during  this writer's shift.  Dr. Marlou Porch made aware. Patient scheduled for dialysis tomorrow morning.  50 mL output measured at 1830. Amber in color.
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
## 2022-08-06 NOTE — Progress Notes (Signed)
   08/06/22 0600  15 Minute Checks  Location Bedroom  Visual Appearance Calm  Behavior Sleeping  Sleep (Behavioral Health Patients Only)  Calculate sleep? (Click Yes once per 24 hr at 0600 safety check) Yes  Documented sleep last 24 hours 6.75

## 2022-08-06 NOTE — Group Note (Signed)
Date:  08/06/2022 Time:  11:48 PM  Group Topic/Focus:  Building Self Esteem:   The Focus of this group is helping patients become aware of the effects of self-esteem on their lives, the things they and others do that enhance or undermine their self-esteem, seeing the relationship between their level of self-esteem and the choices they make and learning ways to enhance self-esteem. Coping With Mental Health Crisis:   The purpose of this group is to help patients identify strategies for coping with mental health crisis.  Group discusses possible causes of crisis and ways to manage them effectively. Developing a Wellness Toolbox:   The focus of this group is to help patients develop a "wellness toolbox" with skills and strategies to promote recovery upon discharge. Early Warning Signs:   The focus of this group is to help patients identify signs or symptoms they exhibit before slipping into an unhealthy state or crisis. Emotional Education:   The focus of this group is to discuss what feelings/emotions are, and how they are experienced. Goals Group:   The focus of this group is to help patients establish daily goals to achieve during treatment and discuss how the patient can incorporate goal setting into their daily lives to aide in recovery. Healthy Communication:   The focus of this group is to discuss communication, barriers to communication, as well as healthy ways to communicate with others. Making Healthy Choices:   The focus of this group is to help patients identify negative/unhealthy choices they were using prior to admission and identify positive/healthier coping strategies to replace them upon discharge. Personal Choices and Values:   The focus of this group is to help patients assess and explore the importance of values in their lives, how their values affect their decisions, how they express their values and what opposes their expression. Recovery Goals:   The focus of this group is to identify  appropriate goals for recovery and establish a plan to achieve them. Relapse Prevention Planning:   The focus of this group is to define relapse and discuss the need for planning to combat relapse. Self Care:   The focus of this group is to help patients understand the importance of self-care in order to improve or restore emotional, physical, spiritual, interpersonal, and financial health. Self Esteem Action Plan:   The focus of this group is to help patients create a plan to continue to build self-esteem after discharge. Spirituality:   The focus of this group is to discuss how one's spirituality can aide in recovery.    Participation Level:  Active  Participation Quality:  Appropriate  Affect:  Appropriate  Cognitive:  Appropriate and Oriented  Insight: Improving  Engagement in Group:  Engaged  Modes of Intervention:  Discussion  Additional Comments:    Lenore Cordia 08/06/2022, 11:48 PM

## 2022-08-06 NOTE — Group Note (Signed)
LCSW Group Therapy Note   Group Date: 08/06/2022 Start Time: 1300 End Time: 1350   Type of Therapy and Topic:  Group Therapy: Boundaries  Participation Level:  Active  Description of Group: This group will address the use of boundaries in their personal lives. Patients will explore why boundaries are important, the difference between healthy and unhealthy boundaries, and negative and postive outcomes of different boundaries and will look at how boundaries can be crossed.  Patients will be encouraged to identify current boundaries in their own lives and identify what kind of boundary is being set. Facilitators will guide patients in utilizing problem-solving interventions to address and correct types boundaries being used and to address when no boundary is being used. Understanding and applying boundaries will be explored and addressed for obtaining and maintaining a balanced life. Patients will be encouraged to explore ways to assertively make their boundaries and needs known to significant others in their lives, using other group members and facilitator for role play, support, and feedback.  Therapeutic Goals:  1.  Patient will identify areas in their life where setting clear boundaries could be  used to improve their life.  2.  Patient will identify signs/triggers that a boundary is not being respected. 3.  Patient will identify two ways to set boundaries in order to achieve balance in  their lives: 4.  Patient will demonstrate ability to communicate their needs and set boundaries  through discussion and/or role plays  Summary of Patient Progress:   Patient was present and active throughout the session and proved open to feedback from CSW and peers. Patient demonstrated poor insight into the subject matter, was respectful of peers, and was present throughout the entire session.  Patient struggled with identifying what he needs help with, though he indicated that he needs help.  Patient also  expressed that he would like to talk to friends but also feels like that his problems will bring his friends down.  CSW encouraged patient to use coping skills and opposite action when feeling down.   Therapeutic Modalities:   Cognitive Behavioral Therapy Solution-Focused Therapy  Harden Mo, LCSWA 08/06/2022  1:50 PM

## 2022-08-06 NOTE — Plan of Care (Signed)
  Problem: Education: Goal: Knowledge of General Education information will improve Description: Including pain rating scale, medication(s)/side effects and non-pharmacologic comfort measures Outcome: Progressing   Problem: Activity: Goal: Risk for activity intolerance will decrease Outcome: Progressing   Problem: Nutrition: Goal: Adequate nutrition will be maintained Outcome: Progressing   Problem: Coping: Goal: Level of anxiety will decrease Outcome: Not Progressing   

## 2022-08-07 DIAGNOSIS — N186 End stage renal disease: Secondary | ICD-10-CM | POA: Diagnosis not present

## 2022-08-07 DIAGNOSIS — N2581 Secondary hyperparathyroidism of renal origin: Secondary | ICD-10-CM | POA: Diagnosis not present

## 2022-08-07 DIAGNOSIS — F319 Bipolar disorder, unspecified: Secondary | ICD-10-CM | POA: Diagnosis not present

## 2022-08-07 DIAGNOSIS — D631 Anemia in chronic kidney disease: Secondary | ICD-10-CM | POA: Diagnosis not present

## 2022-08-07 DIAGNOSIS — I120 Hypertensive chronic kidney disease with stage 5 chronic kidney disease or end stage renal disease: Secondary | ICD-10-CM | POA: Diagnosis not present

## 2022-08-07 LAB — RENAL FUNCTION PANEL
Albumin: 4.1 g/dL (ref 3.5–5.0)
Anion gap: 12 (ref 5–15)
BUN: 76 mg/dL — ABNORMAL HIGH (ref 8–23)
CO2: 26 mmol/L (ref 22–32)
Calcium: 9 mg/dL (ref 8.9–10.3)
Chloride: 94 mmol/L — ABNORMAL LOW (ref 98–111)
Creatinine, Ser: 6.55 mg/dL — ABNORMAL HIGH (ref 0.61–1.24)
GFR, Estimated: 8 mL/min — ABNORMAL LOW (ref >60)
Glucose, Bld: 162 mg/dL — ABNORMAL HIGH (ref 70–99)
Phosphorus: 5.7 mg/dL — ABNORMAL HIGH (ref 2.5–4.6)
Potassium: 3.7 mmol/L (ref 3.5–5.1)
Sodium: 132 mmol/L — ABNORMAL LOW (ref 135–145)

## 2022-08-07 LAB — CBC
HCT: 29.3 % — ABNORMAL LOW (ref 39.0–52.0)
Hemoglobin: 10.5 g/dL — ABNORMAL LOW (ref 13.0–17.0)
MCH: 35 pg — ABNORMAL HIGH (ref 26.0–34.0)
MCHC: 35.8 g/dL (ref 30.0–36.0)
MCV: 97.7 fL (ref 80.0–100.0)
Platelets: 115 10*3/uL — ABNORMAL LOW (ref 150–400)
RBC: 3 MIL/uL — ABNORMAL LOW (ref 4.22–5.81)
RDW: 12.7 % (ref 11.5–15.5)
WBC: 4.9 10*3/uL (ref 4.0–10.5)
nRBC: 0 % (ref 0.0–0.2)

## 2022-08-07 MED ORDER — AMLODIPINE BESYLATE 5 MG PO TABS
5.0000 mg | ORAL_TABLET | Freq: Every day | ORAL | Status: DC
  Administered 2022-08-07 – 2022-08-09 (×3): 5 mg via ORAL
  Filled 2022-08-07 (×3): qty 1
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
## 2022-08-07 NOTE — Progress Notes (Signed)
Patient has stayed in the milieu and reported no negative effects from dialysis. He ate dinner and  watched TV with peers. Did not have any concerns throughout the shift.

## 2022-08-07 NOTE — Progress Notes (Signed)
   08/07/22 2100  Psych Admission Type (Psych Patients Only)  Admission Status Voluntary  Psychosocial Assessment  Patient Complaints Depression  Eye Contact Fair  Facial Expression Flat  Affect Appropriate to circumstance  Speech Logical/coherent  Interaction Assertive  Motor Activity Slow  Appearance/Hygiene Unremarkable  Behavior Characteristics Cooperative;Appropriate to situation  Mood Depressed  Thought Process  Coherency WDL  Content WDL  Delusions None reported or observed  Perception WDL  Hallucination None reported or observed  Judgment WDL  Confusion None  Danger to Self  Current suicidal ideation? Denies  Danger to Others  Danger to Others None reported or observed

## 2022-08-07 NOTE — BH IP Treatment Plan (Signed)
Interdisciplinary Treatment and Diagnostic Plan Update  08/07/2022 Time of Session: 9:24 am Luis Miller. MRN: [id number]  Principal Diagnosis: Bipolar depression (HCC)  Secondary Diagnoses: Principal Problem:   Bipolar depression (HCC)   Current Medications:  Current Facility-Administered Medications  Medication Dose Route Frequency Provider Last Rate Last Admin   acetaminophen (TYLENOL) tablet 650 mg  650 mg Oral Q6H PRN Clapacs, Jackquline Denmark, MD       allopurinol (ZYLOPRIM) tablet 100 mg  100 mg Oral BID Clapacs, [name], MD   100 mg at 08/06/22 1002   alteplase (CATHFLO ACTIVASE) injection 2 mg  2 mg Intracatheter Once PRN Lateef, Munsoor, MD       alum & mag hydroxide-simeth (MAALOX/MYLANTA) 200-200-20 MG/5ML suspension 30 mL  30 mL Oral Q4H PRN Clapacs, [name], MD       amLODipine (NORVASC) tablet 10 mg  10 mg Oral Daily Clapacs, [name], MD   10 mg at 08/06/22 1002   ARIPiprazole (ABILIFY) tablet 2 mg  2 mg Oral QPC breakfast [name], DO   2 mg at 08/06/22 1048   benazepril (LOTENSIN) tablet 40 mg  40 mg Oral Daily Clapacs, [name], MD   40 mg at 08/06/22 1002   buPROPion (WELLBUTRIN XL) 24 hr tablet 150 mg  150 mg Oral Daily [name], DO   150 mg at 08/06/22 1002   cholecalciferol (VITAMIN D3) 25 MCG (1000 UNIT) tablet 1,000 Units  1,000 Units Oral Daily Clapacs, [name], MD   1,000 Units at 08/06/22 1002   doxepin (SINEQUAN) capsule 50 mg  50 mg Oral QHS [name], DO   50 mg at 08/06/22 2123   fluticasone (FLONASE) 50 MCG/ACT nasal spray 2 spray  2 spray Each Nare Daily PRN Clapacs, [name], MD       heparin injection 1,000 Units  1,000 Units Dialysis PRN Lateef, Munsoor, MD       lidocaine (PF) (XYLOCAINE) 1 % injection 5 mL  5 mL Intradermal PRN Lateef, Munsoor, MD       lidocaine-prilocaine (EMLA) cream 1 Application  1 Application Topical PRN Lateef, Munsoor, MD       loratadine (CLARITIN) tablet 10 mg  10 mg Oral Daily Clapacs, [name],  MD   10 mg at 08/06/22 1002   mirtazapine (REMERON) tablet 15 mg  15 mg Oral QHS [name], DO   15 mg at 08/06/22 2123   pentafluoroprop-tetrafluoroeth (GEBAUERS) aerosol 1 Application  1 Application Topical PRN Lateef, Munsoor, MD       polyethylene glycol (MIRALAX / GLYCOLAX) packet 17 g  17 g Oral Daily PRN Sharen Hones, RPH       potassium chloride (KLOR-CON M) CR tablet 10 mEq  10 mEq Oral Daily [name], DO   10 mEq at 08/06/22 1002   rosuvastatin (CRESTOR) tablet 10 mg  10 mg Oral Daily Clapacs, [name], MD   10 mg at 08/06/22 1002   traZODone (DESYREL) tablet 50 mg  50 mg Oral QHS PRN [name], DO   50 mg at 08/05/22 2130   PTA Medications: Medications Prior to Admission  Medication Sig Dispense Refill Last Dose   allopurinol (ZYLOPRIM) 100 MG tablet Take 100 mg by mouth 2 (two) times daily.      amLODipine (NORVASC) 10 MG tablet TAKE 1 TABLET DAILY 90 tablet 1    benazepril (LOTENSIN) 40 MG tablet Take 40 mg by mouth daily.  calcitRIOL (ROCALTROL) 0.25 MCG capsule Take 0.25 mcg by mouth daily. (Patient not taking: Reported on 07/30/2022)      FLUoxetine (PROZAC) 20 MG capsule Take 1 capsule (20 mg total) by mouth daily. 90 capsule 0    fluticasone (FLONASE) 50 MCG/ACT nasal spray Place 2 sprays into both nostrils daily. 16 g 6    hydrALAZINE (APRESOLINE) 100 MG tablet Take 100 mg by mouth 3 (three) times daily.      lamoTRIgine (LAMICTAL) 25 MG tablet TAKE 1 TABLET DAILY 90 tablet 0    loperamide (IMODIUM) 2 MG capsule Take 2 mg by mouth daily as needed for diarrhea or loose stools.      loratadine (CLARITIN) 10 MG tablet TAKE 1 TABLET BY MOUTH EVERY DAY 90 tablet 1    Multiple Vitamin (MULTIVITAMIN) tablet Take 1 tablet by mouth daily.      OLANZapine (ZYPREXA) 5 MG tablet Take 1 tablet (5 mg total) by mouth at bedtime. 90 tablet 0    rosuvastatin (CRESTOR) 10 MG tablet TAKE 1 TABLET BY MOUTH EVERY DAY 90 tablet 0    Vitamin D,  Cholecalciferol, 25 MCG (1000 UT) TABS Take 1,000 tablets by mouth daily.        Patient Stressors: Medication change or noncompliance    Patient Strengths: Ability for insight  Average or above average intelligence  Capable of independent living  Communication skills  Motivation for treatment/growth  Supportive family/friends   Treatment Modalities: Medication Management, Group therapy, Case management,  1 to 1 session with clinician, Psychoeducation, Recreational therapy.   Physician Treatment Plan for Primary Diagnosis: Bipolar depression (HCC) Long Term Goal(s): Improvement in symptoms so as ready for discharge   Short Term Goals: Compliance with prescribed medications will improve Ability to verbalize feelings will improve  Medication Management: Evaluate patient's response, side effects, and tolerance of medication regimen.  Therapeutic Interventions: 1 to 1 sessions, Unit Group sessions and Medication administration.  Evaluation of Outcomes: Progressing  Physician Treatment Plan for Secondary Diagnosis: Principal Problem:   Bipolar depression (HCC)  Long Term Goal(s): Improvement in symptoms so as ready for discharge   Short Term Goals: Compliance with prescribed medications will improve Ability to verbalize feelings will improve     Medication Management: Evaluate patient's response, side effects, and tolerance of medication regimen.  Therapeutic Interventions: 1 to 1 sessions, Unit Group sessions and Medication administration.  Evaluation of Outcomes: Progressing   RN Treatment Plan for Primary Diagnosis: Bipolar depression (HCC) Long Term Goal(s): Knowledge of disease and therapeutic regimen to maintain health will improve  Short Term Goals: Ability to verbalize frustration and anger appropriately will improve, Ability to participate in decision making will improve, Ability to verbalize feelings will improve, Ability to identify and develop effective coping  behaviors will improve, and Compliance with prescribed medications will improve  Medication Management: RN will administer medications as ordered by provider, will assess and evaluate patient's response and provide education to patient for prescribed medication. RN will report any adverse and/or side effects to prescribing provider.  Therapeutic Interventions: 1 on 1 counseling sessions, Psychoeducation, Medication administration, Evaluate responses to treatment, Monitor vital signs and CBGs as ordered, Perform/monitor CIWA, COWS, AIMS and Fall Risk screenings as ordered, Perform wound care treatments as ordered.  Evaluation of Outcomes: Progressing   LCSW Treatment Plan for Primary Diagnosis: Bipolar depression (HCC) Long Term Goal(s): Safe transition to appropriate next level of care at discharge, Engage patient in therapeutic group addressing interpersonal concerns.  Short Term Goals: Engage  patient in aftercare planning with referrals and resources, Increase social support, Increase ability to appropriately verbalize feelings, Increase emotional regulation, and Increase skills for wellness and recovery  Therapeutic Interventions: Assess for all discharge needs, 1 to 1 time with Social worker, Explore available resources and support systems, Assess for adequacy in community support network, Educate family and significant other(s) on suicide prevention, Complete Psychosocial Assessment, Interpersonal group therapy.  Evaluation of Outcomes: Progressing   Progress in Treatment: Attending groups: Yes. Participating in groups: Yes. Taking medication as prescribed: Yes. Toleration medication: Yes. Family/Significant other contact made: Yes, individual(s) contacted:  Elberta Leatherwood [phone] Patient understands diagnosis: Yes. Discussing patient identified problems/goals with staff: Yes. Medical problems stabilized or resolved: Yes. Denies suicidal/homicidal ideation:  Yes. Issues/concerns per patient self-inventory: No. Other: none  New problem(s) identified: No, Describe:  none  New Short Term/Long Term Goal(s): medication management for mood stabilization; elimination of SI thoughts; development of comprehensive mental wellness plan. Update 08/07/22: The patient has goals are the same, no new goals reported at this time.     Patient Goals:  "get out of here and get back to what I was doing before I needed to come here"  Update 08/07/22: The patient has not provided any new goals at this time.   Discharge Plan or Barriers: CSW to assist patient in development of appropriate discharge plans. Patient has a current mental health therapist and reports plans to continue with her. Patient reports plans to return to his home. Update 08/07/22: The patient continues to report that he is depressed. There has been no updates to the patients discharge plan at this time.  Reason for Continuation of Hospitalization: Anxiety Depression Medication stabilization Suicidal ideation  Estimated Length of Stay:  Last 3 [place] Suicide Severity Risk Score: Flowsheet Row Admission (Current) from 07/30/2022 in [facility] Most recent reading at 07/30/2022  6:00 PM ED from 07/30/2022 in [facility] at [facility] Most recent reading at 07/30/2022  3:10 PM Office Visit from 07/26/2022 in confidential department Most recent reading at 07/27/2022 11:27 AM  C-SSRS RISK CATEGORY No Risk No Risk No Risk       Last PHQ 2/9 Scores:    07/26/2022    9:10 AM 02/24/2022    1:14 PM 01/25/2022    1:34 PM  Depression screen PHQ 2/9  Decreased Interest 1 0 0  Down, Depressed, Hopeless 1 0 0  PHQ - 2 Score 2 0 0  Altered sleeping 0 0 0  Tired, decreased energy 0 0 0  Change in appetite 0 0 0  Feeling bad or failure about yourself  1 0 0  Trouble concentrating 0 0 0  Moving slowly or fidgety/restless 0 0 0  Suicidal thoughts 0 0 0  PHQ-9  Score 3 0 0  Difficult doing work/chores  Not difficult at all     Scribe for Treatment Team: Marshell Levan, LCSW 08/07/2022 9:24 AM

## 2022-08-07 NOTE — Progress Notes (Signed)
Patient was cooperative with treatment, he had 300cc of urine clear. He still endorses depression and anxiety.

## 2022-08-07 NOTE — Progress Notes (Signed)
  Received patient in bed to unit.   Informed consent signed and in chart.    TX duration: 3.5HRS     Transported back to floor  Hand-off given to patient's nurse. No c/o and no distress noted    Access used: LAVF Access issues: none   Total UF removed: 2.0L Medication(s) given: none Post HD VS: 126/70 Post HD weight: 82.9kg     Lynann Beaver  [facility]

## 2022-08-07 NOTE — Progress Notes (Signed)
[facility]  ROUNDING NOTE   Subjective:   Seen and examined on hemodialysis. Tolerating treatment well. Seated in chair    HEMODIALYSIS FLOWSHEET:  Blood Flow Rate (mL/min): 400 mL/min Arterial Pressure (mmHg): -160 mmHg Venous Pressure (mmHg): 220 mmHg TMP (mmHg): 10 mmHg Ultrafiltration Rate (mL/min): 829 mL/min Dialysate Flow Rate (mL/min): 300 ml/min Dialysis Fluid Bolus: Normal Saline Bolus Amount (mL): 100 mL   Objective:  Vital signs in last 24 hours:  Temp:  [98.2 F (36.8 C)-98.3 F (36.8 C)] 98.3 F (36.8 C) (05/18 0859) Pulse Rate:  [64-74] 64 (05/18 0930) Resp:  [14-17] 17 (05/18 0930) BP: (94-118)/(57-78) 94/69 (05/18 0930) SpO2:  [96 %-100 %] 96 % (05/18 0930) Weight:  [85 kg] 85 kg (05/18 0845)  Weight change: -1.411 kg Filed Weights   08/05/22 1300 08/06/22 0734 08/07/22 0845  Weight: 83.3 kg 83.7 kg 85 kg    Intake/Output: I/O last 3 completed shifts: In: 960 [P.O.:960] Out: 150 [Urine:150]   Intake/Output this shift:  No intake/output data recorded.  Physical Exam: General: NAD, seated in chair  Head: Normocephalic, atraumatic. Moist oral mucosal membranes  Eyes: Anicteric, PERRL  Neck: Supple, trachea midline  Lungs:  Clear to auscultation  Heart: Regular rate and rhythm  Abdomen:  Soft, nontender,   Extremities:  no peripheral edema.  Neurologic: Nonfocal, moving all four extremities  Skin: No lesions  Access: Left AVG    Basic Metabolic Panel: Recent Labs  Lab 07/31/22 1129 08/05/22 0900  NA 139 133*  K 3.6 3.1*  CL 102 94*  CO2 27 24  GLUCOSE 110* 196*  BUN 43* 80*  CREATININE 5.48* 6.43*  CALCIUM 9.3 9.0  PHOS 3.5 5.7*     Liver Function Tests: Recent Labs  Lab 07/31/22 1129 08/05/22 0900  ALBUMIN 4.2 3.9    No results for input(s): "LIPASE", "AMYLASE" in the last 168 hours. No results for input(s): "AMMONIA" in the last 168 hours.  CBC: Recent Labs  Lab 07/31/22 1129 08/05/22 0900  08/07/22 0855  WBC 4.2 4.8 4.9  HGB 11.1* 10.7* 10.5*  HCT 32.5* 30.6* 29.3*  MCV 101.2* 99.7 97.7  PLT 129* 125* 115*     Cardiac Enzymes: No results for input(s): "CKTOTAL", "CKMB", "CKMBINDEX", "TROPONINI" in the last 168 hours.  BNP: Invalid input(s): "POCBNP"  CBG: No results for input(s): "GLUCAP" in the last 168 hours.  Microbiology: Results for orders placed or performed in visit on 05/12/22  Microscopic Examination     Status: Abnormal   Collection Time: 05/12/22 10:39 AM   Urine  Result Value Ref Range Status   WBC, UA 6-10 (A) 0 - 5 /hpf Final   RBC, Urine 0-2 0 - 2 /hpf Final   Epithelial Cells (non renal) 0-10 0 - 10 /hpf Final   Bacteria, UA Moderate (A) None seen/Few Final    Coagulation Studies: No results for input(s): "LABPROT", "INR" in the last 72 hours.  Urinalysis: No results for input(s): "COLORURINE", "LABSPEC", "PHURINE", "GLUCOSEU", "HGBUR", "BILIRUBINUR", "KETONESUR", "PROTEINUR", "UROBILINOGEN", "NITRITE", "LEUKOCYTESUR" in the last 72 hours.  Invalid input(s): "APPERANCEUR"    Imaging: No results found.   Medications:     allopurinol  100 mg Oral BID   amLODipine  10 mg Oral Daily   ARIPiprazole  2 mg Oral QPC breakfast   benazepril  40 mg Oral Daily   buPROPion  150 mg Oral Daily   cholecalciferol  1,000 Units Oral Daily   doxepin  50 mg Oral QHS  loratadine  10 mg Oral Daily   mirtazapine  15 mg Oral QHS   potassium chloride  10 mEq Oral Daily   rosuvastatin  10 mg Oral Daily   acetaminophen, alteplase, alum & mag hydroxide-simeth, fluticasone, heparin, lidocaine (PF), lidocaine-prilocaine, pentafluoroprop-tetrafluoroeth, polyethylene glycol, traZODone  Assessment/ Plan:  Luis Miller. is a 77 y.o. white male with end stage renal disease on hemodialysis, hypertension, gout, depression, history of melanoma, crohn's, and CVA who presents to [facility] on 07/30/2022 for Bipolar depression (HCC) [F31.9]  [facility] TTS [facility]  [address] Left AVG 87kg  End Stage Renal Disease on hemodialysis: Seen and examined on dialysis treatment. Tolerating treatment well. Continue TTS schedule. 3K bath. Patient is currently below with dry weight.   Anemia with chronic kidney disease: Hemoglobin at goal, 10.5. Continue Mircera as outpatient.   Hypertension with chronic kidney disease: Hypotensive on treatment today. Current regimen of amlodipine, benazepril. Patient did not get his medications this morning prior to dialysis. - hold patient's outpatient hydralazine - Decrease amlodipine to 5mg  daily.  Secondary Hyperparathyroidism: outpatient PTH of 456. Not currently on phos binders or vitamin D agents.    LOS: 8 Emmarae Cowdery 5/18/20249:37 AM

## 2022-08-07 NOTE — Progress Notes (Signed)
Patient off unit to dialysis.

## 2022-08-07 NOTE — Progress Notes (Signed)
Patient has returned from dialysis 

## 2022-08-08 DIAGNOSIS — F319 Bipolar disorder, unspecified: Secondary | ICD-10-CM | POA: Diagnosis not present

## 2022-08-08 MED ORDER — SENNOSIDES-DOCUSATE SODIUM 8.6-50 MG PO TABS
2.0000 | ORAL_TABLET | Freq: Once | ORAL | Status: AC
  Administered 2022-08-08: 2 via ORAL
  Filled 2022-08-08: qty 2

## 2022-08-08 NOTE — Plan of Care (Signed)
Pt denies SI / HI / AVH. Pt is focused on complaint of constipation today. Pt is on I / O which are being monitored. Staff obtained orders from provider for constipation relief. No bowel movements at this time. No signs of distress or injury noted. Staff will continue q15 minute safety checks.   Problem: Education: Goal: Knowledge of General Education information will improve Description: Including pain rating scale, medication(s)/side effects and non-pharmacologic comfort measures Outcome: Progressing   Problem: Activity: Goal: Risk for activity intolerance will decrease Outcome: Progressing   Problem: Coping: Goal: Level of anxiety will decrease Outcome: Progressing

## 2022-08-08 NOTE — Progress Notes (Signed)
Patient A&Ox4. Patient denies SI/Hi and AVH. Patient compliant with medications and was given trazodone prn for sleep. Pt is using CPAP for sleep apnea. Pt denies any physical complaints when asked. No acute distress noted. Support and encouragement provided. Routine safety checks conducted according to facility protocol. Encouraged patient to notify staff if thoughts of harm toward self or others arise. Patient verbalize understanding and agreement. Will continue to monitor for safety.
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
## 2022-08-08 NOTE — Progress Notes (Signed)
   08/08/22 2200  Psych Admission Type (Psych Patients Only)  Admission Status Voluntary  Psychosocial Assessment  Patient Complaints None  Eye Contact Fair  Facial Expression Flat  Affect Appropriate to circumstance  Speech Logical/coherent  Interaction Assertive  Motor Activity Slow  Appearance/Hygiene Unremarkable  Behavior Characteristics Cooperative  Mood Depressed  Thought Process  Coherency WDL  Content WDL  Delusions None reported or observed  Perception WDL  Hallucination None reported or observed  Judgment WDL  Confusion None  Danger to Self  Current suicidal ideation? Denies  Danger to Others  Danger to Others None reported or observed

## 2022-08-08 NOTE — Group Note (Signed)
LCSW Group Therapy Note   Group Date: 08/08/2022 Start Time: 1307 End Time: 1352   Type of Therapy and Topic: Group Therapy: Healthy VS. Unhealthy Coping Strategies   Participation Level:  Active    Summary of Patient Progress:  The patient attended group. The patient stated that he has been depressed most of his life, but he use to be able to function. The patient stated that he has been having issues coping. He stated that he like to go dancing and listens to [employer]. The patient stated that he is socially withdrawn.     Marshell Levan, LCSWA 08/08/2022  2:09 PM

## 2022-08-08 NOTE — Group Note (Signed)
Date:  08/08/2022 Time:  11:03 AM  Group Topic/Focus:  Rediscovering Joy:   The focus of this group is to explore various ways to relieve stress in a positive manner.     Participation Level:  Active  Participation Quality:  Appropriate  Affect:  Appropriate  Cognitive:  Alert and Appropriate  Insight: Appropriate  Engagement in Group:  Engaged  Modes of Intervention:  Support  Additional Comments:    Berkley Harvey 08/08/2022, 11:03 AM

## 2022-08-08 NOTE — Group Note (Signed)
Date:  08/08/2022 Time:  10:01 PM  Group Topic/Focus:  Healthy Communication:   The focus of this group is to discuss communication, barriers to communication, as well as healthy ways to communicate with others. Today we discussed things we are grateful for   Participation Level:  Active  Participation Quality:  Appropriate  Affect:  Appropriate  Cognitive:  Alert  Insight: Appropriate  Engagement in Group:  Engaged  Modes of Intervention:  Education  Additional Comments:    Garry Heater 08/08/2022, 10:01 PM

## 2022-08-09 DIAGNOSIS — F319 Bipolar disorder, unspecified: Secondary | ICD-10-CM | POA: Diagnosis not present

## 2022-08-09 MED ORDER — ARIPIPRAZOLE 2 MG PO TABS
2.0000 mg | ORAL_TABLET | Freq: Once | ORAL | Status: AC
  Administered 2022-08-09: 2 mg via ORAL
  Filled 2022-08-09: qty 1

## 2022-08-09 MED ORDER — ARIPIPRAZOLE 5 MG PO TABS
5.0000 mg | ORAL_TABLET | Freq: Every day | ORAL | Status: DC
  Administered 2022-08-10 – 2022-08-16 (×7): 5 mg via ORAL
  Filled 2022-08-09 (×7): qty 1

## 2022-08-09 NOTE — Progress Notes (Signed)
Patient has sad and anxious affect this morning.  Patient reports he feels as if he has developed Alzheimer's disease. "I don't know how I would balance my check book when I go home." Endorses anxiety and depression.  Denies SI,HI and AVH.  Denies pain.  Daily weight documented  I/O's monitored daily.  Compliant with scheduled medications.  15 min checks in place for safety.  NT assisted patient with shaving.  Patient is present in the milieu.  Appropriate interaction with peers.

## 2022-08-09 NOTE — Plan of Care (Signed)
  Problem: Education: Goal: Knowledge of General Education information will improve Description: Including pain rating scale, medication(s)/side effects and non-pharmacologic comfort measures Outcome: Progressing   Problem: Nutrition: Goal: Adequate nutrition will be maintained Outcome: Progressing   Problem: Skin Integrity: Goal: Risk for impaired skin integrity will decrease Outcome: Progressing   Problem: Elimination: Goal: Will not experience complications related to bowel motility Outcome: Not Progressing

## 2022-08-09 NOTE — Group Note (Signed)
Date:  08/09/2022 Time:  8:56 PM  Group Topic/Focus:  Conflict Resolution:   The focus of this group is to discuss the conflict resolution process and how it may be used upon discharge.    Participation Level:  Active  Participation Quality:  Appropriate  Affect:  Appropriate  Cognitive:  Alert  Insight: Improving  Engagement in Group:  Engaged  Modes of Intervention:  Discussion  Additional Comments:    Burt Ek 08/09/2022, 8:56 PM
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
## 2022-08-09 NOTE — Group Note (Signed)
Recreation Therapy Group Note   Group Topic:Communication  Group Date: 08/09/2022 Start Time: 1400 End Time: 1445 Facilitators: Rosina Lowenstein, LRT, CTRS Location: Courtyard  Group Description: Trivia. LRT reads off trivia question for patients to hear. Pts are given a set time limit to answer the question correctly. LRT waits for all patients to make a guess out loud. LRT facilitated post-game discussion on the importance of working well with others, active listening to others and being a part of a team. LRT and pts discussed how this can apply to life post-discharge.  Goal Area(s) Addressed: Patient will communicate with peers and LRT. Patient will recall a past historical fact. Patient will build on frustration tolerance skills.  Patient will practice active listening skills.  Affect/Mood: Appropriate and Flat   Participation Level: Active and Engaged   Participation Quality: Independent   Behavior: Appropriate, Calm, and Cooperative   Speech/Thought Process: Coherent   Insight: Good   Judgement: Good   Modes of Intervention: Guided Discussion   Patient Response to Interventions:  Attentive, Engaged, Interested , and Receptive   Education Outcome:  Acknowledges education   Clinical Observations/Individualized Feedback: [name] was active in their participation of session activities and group discussion. Pt identified "1950's music would be the category I know most about." Pt correctly answered multiple trivia questions. Pt interacted well with LRT and peers duration of session.    Plan: Continue to engage patient in RT group sessions 2-3x/week.   Rosina Lowenstein, LRT, CTRS 08/09/2022 2:53 PM

## 2022-08-10 ENCOUNTER — Other Ambulatory Visit: Payer: Self-pay | Admitting: Psychiatry

## 2022-08-10 DIAGNOSIS — N186 End stage renal disease: Secondary | ICD-10-CM | POA: Diagnosis not present

## 2022-08-10 DIAGNOSIS — F319 Bipolar disorder, unspecified: Secondary | ICD-10-CM | POA: Diagnosis not present

## 2022-08-10 LAB — RENAL FUNCTION PANEL
Albumin: 3.9 g/dL (ref 3.5–5.0)
Anion gap: 15 (ref 5–15)
BUN: 93 mg/dL — ABNORMAL HIGH (ref 8–23)
CO2: 23 mmol/L (ref 22–32)
Calcium: 8.9 mg/dL (ref 8.9–10.3)
Chloride: 90 mmol/L — ABNORMAL LOW (ref 98–111)
Creatinine, Ser: 6.99 mg/dL — ABNORMAL HIGH (ref 0.61–1.24)
GFR, Estimated: 8 mL/min — ABNORMAL LOW (ref >60)
Glucose, Bld: 120 mg/dL — ABNORMAL HIGH (ref 70–99)
Phosphorus: 6.3 mg/dL — ABNORMAL HIGH (ref 2.5–4.6)
Potassium: 3.6 mmol/L (ref 3.5–5.1)
Sodium: 128 mmol/L — ABNORMAL LOW (ref 135–145)

## 2022-08-10 LAB — CBC
HCT: 27.6 % — ABNORMAL LOW (ref 39.0–52.0)
Hemoglobin: 10 g/dL — ABNORMAL LOW (ref 13.0–17.0)
MCH: 34.7 pg — ABNORMAL HIGH (ref 26.0–34.0)
MCHC: 36.2 g/dL — ABNORMAL HIGH (ref 30.0–36.0)
MCV: 95.8 fL (ref 80.0–100.0)
Platelets: 120 10*3/uL — ABNORMAL LOW (ref 150–400)
RBC: 2.88 MIL/uL — ABNORMAL LOW (ref 4.22–5.81)
RDW: 12.2 % (ref 11.5–15.5)
WBC: 3.9 10*3/uL — ABNORMAL LOW (ref 4.0–10.5)
nRBC: 0 % (ref 0.0–0.2)

## 2022-08-10 MED ORDER — AMLODIPINE BESYLATE 5 MG PO TABS
5.0000 mg | ORAL_TABLET | Freq: Every evening | ORAL | Status: DC
  Administered 2022-08-11: 5 mg via ORAL
  Filled 2022-08-10 (×2): qty 1

## 2022-08-10 MED ORDER — BENAZEPRIL HCL 10 MG PO TABS
10.0000 mg | ORAL_TABLET | Freq: Every evening | ORAL | Status: DC
  Administered 2022-08-11 – 2022-09-01 (×20): 10 mg via ORAL
  Filled 2022-08-10 (×23): qty 1

## 2022-08-10 MED ORDER — PENTAFLUOROPROP-TETRAFLUOROETH EX AERO
INHALATION_SPRAY | CUTANEOUS | Status: AC
  Filled 2022-08-10: qty 30

## 2022-08-10 NOTE — Progress Notes (Signed)
   08/10/22 2200  Psych Admission Type (Psych Patients Only)  Admission Status Voluntary  Psychosocial Assessment  Patient Complaints Depression;Worrying;Nervousness;Hopelessness ("I am nervous about my new treatment tomorrow")  Eye Contact Fair  Facial Expression Flat;Sad  Affect Depressed;Sad;Flat  Speech Logical/coherent  Interaction Assertive  Motor Activity Slow  Appearance/Hygiene Unremarkable  Behavior Characteristics Cooperative;Appropriate to situation  Mood Depressed;Sad;Other (Comment) (nervous)  Thought Process  Coherency WDL  Content WDL  Delusions None reported or observed  Perception WDL  Hallucination None reported or observed  Judgment Impaired  Confusion None  Danger to Self  Current suicidal ideation? Denies  Danger to Others  Danger to Others None reported or observed

## 2022-08-10 NOTE — Group Note (Signed)
Recreation Therapy Group Note   Group Topic:Emotion Expression  Group Date: 08/10/2022 Start Time: 1400 End Time: 1455 Facilitators: Rosina Lowenstein, LRT, CTRS Location:  Dayroom and Courtyard   Group Description: Picture a Diplomatic Services operational officer. Patients and LRT discuss what it means to be "at peace", what it feels like physically and mentally. Pts are given a canvas and oil pastels to use and encouraged to draw their idea of a peaceful place. Pts and LRT discuss how they use this in their daily life post discharge. Pts are encouraged to take their canvas home with them as a reminder of their peaceful place whenever they are feeling depressed, anxious, etc. After completing their peaceful place, pts had the opportunity to go outside to the courtyard if they wanted.   Goal Area(s) Addressed:  Patient will identify what it means to experience a "peaceful" emotion. Patient will identify a new coping skill.  Patient will express their emotions through art. Patients will increase communication by talking with LRT and peers while in group.  Affect/Mood: Appropriate and Flat   Participation Level: Active and Engaged   Participation Quality: Independent   Behavior: Appropriate, Calm, and Cooperative   Speech/Thought Process: Coherent   Insight: Good   Judgement: Good   Modes of Intervention: Art   Patient Response to Interventions:  Attentive, Engaged, Interested , and Receptive   Education Outcome:  Acknowledges education   Clinical Observations/Individualized Feedback: Luis Miller was active in their participation of session activities and group discussion. Pt identified "the ocean and an umbrella on the beach with seagulls around and a rainbow" as his peaceful place. Pt spontaneously contributed to group discussion and shared that imaging a peaceful place "brings back good memories". Pt chose to go outside for fresh air and sunlight. Pt interacted well with LRT and peers.    Plan: Continue to  engage patient in RT group sessions 2-3x/week.   Rosina Lowenstein, LRT, CTRS 08/10/2022 3:07 PM

## 2022-08-10 NOTE — Progress Notes (Signed)
   08/10/22 0000  Psych Admission Type (Psych Patients Only)  Admission Status Voluntary  Psychosocial Assessment  Patient Complaints Depression;Worrying  Eye Contact Fair  Facial Expression Sad  Affect Sad;Anxious  Speech Logical/coherent  Interaction Assertive  Motor Activity Slow  Appearance/Hygiene Unremarkable  Behavior Characteristics Cooperative;Appropriate to situation  Mood Anxious;Depressed  Thought Process  Coherency WDL  Content WDL  Delusions None reported or observed  Perception WDL  Hallucination None reported or observed  Judgment Impaired  Confusion None  Danger to Self  Current suicidal ideation? Denies  Danger to Others  Danger to Others None reported or observed

## 2022-08-10 NOTE — Plan of Care (Signed)

## 2022-08-10 NOTE — Progress Notes (Signed)
Patient has sad, flat affect.  Patient unsure how well he slept last night.  Reports he felt unsteady on his feet, but denies feeling dizzy.  Steady gait noted by this Clinical research associate. Endorses anxiety, depression and hopelessness.  Denies SI,HI and AVH.  Patient will have HD this morning.  Medications will be given when patient returns to the unit.  15 min checks in place for safety.  Daily weight documented.  Patient has agreed to ECT treatments starting tomorrow. EKG completed (in chart).    Left for HD at 0832.  Returned to unit at 1305. VS WNL. BP medications held per Dr. Marlou Porch.  All other medications administered as ordered.   Patient given lunch tray. Patient present in the milieu.  Appropriate interaction with peers.
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
## 2022-08-10 NOTE — Group Note (Signed)
Date:  08/10/2022 Time:  11:10 AM  Group Topic/Focus:  Customer service manager    Participation Level:  Did Not Attend  Participation Quality:    Affect:    Cognitive:    Insight:   Engagement in Group:    Modes of Intervention:  Additional Comments:  Patients learned to work together by teaching card games to peers and staff.  Boisvert, Amy T 08/10/2022, 11:10 AM

## 2022-08-10 NOTE — Progress Notes (Signed)
Hemodialysis note  Received patient in bed to unit. Alert and oriented.  Informed consent signed and in chart.  Treatment initiated: 0900 Treatment completed: 1253  Patient tolerated well. Transported back to room, alert without acute distress.  Report given to patient's RN.   Access used: RUA AVF Access issues: none  Total UF removed: 2L Medication(s) given:  none  Post HD weight: 82.9 kg   Wolfgang Phoenix [facility]
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
## 2022-08-10 NOTE — Group Note (Signed)
[facility] LCSW Group Therapy Note   Group Date: 08/10/2022 Start Time: 1315 End Time: 1400   Type of Therapy and Topic: Group Therapy: Avoiding Self-Sabotaging and Enabling Behaviors  Participation Level: None  Mood:  Description of Group:  In this group, patients will learn how to identify obstacles, self-sabotaging and enabling behaviors, as well as: what are they, why do we do them and what needs these behaviors meet. Discuss unhealthy relationships and how to have positive healthy boundaries with those that sabotage and enable. Explore aspects of self-sabotage and enabling in yourself and how to limit these self-destructive behaviors in everyday life.   Therapeutic Goals: 1. Patient will identify one obstacle that relates to self-sabotage and enabling behaviors 2. Patient will identify one personal self-sabotaging or enabling behavior they did prior to admission 3. Patient will state a plan to change the above identified behavior 4. Patient will demonstrate ability to communicate their needs through discussion and/or role play.    Summary of Patient Progress: Patient present in group.  Patient did not participate in discussion. Patient had just returned from dialysis and was eating his lunch.   Therapeutic Modalities:  Cognitive Behavioral Therapy Person-Centered Therapy Motivational Interviewing    Harden Mo, LCSW

## 2022-08-10 NOTE — Progress Notes (Signed)
[facility]  ROUNDING NOTE   Subjective:   Patient seen and evaluated during dialysis   HEMODIALYSIS FLOWSHEET:  Blood Flow Rate (mL/min): 400 mL/min (Simultaneous filing. User may not have seen previous data.) Arterial Pressure (mmHg): -170 mmHg (Simultaneous filing. User may not have seen previous data.) Venous Pressure (mmHg): 220 mmHg (Simultaneous filing. User may not have seen previous data.) TMP (mmHg): 10 mmHg (Simultaneous filing. User may not have seen previous data.) Ultrafiltration Rate (mL/min): 829 mL/min (Simultaneous filing. User may not have seen previous data.) Dialysate Flow Rate (mL/min): 300 ml/min (Simultaneous filing. User may not have seen previous data.) Dialysis Fluid Bolus: Normal Saline Bolus Amount (mL): 100 mL  States "things are not getting any better", " I'm about ready to quit this too." (Motioning to dialysis machine)  Objective:  Vital signs in last 24 hours:  Temp:  [97.7 F (36.5 C)-98.3 F (36.8 C)] 97.7 F (36.5 C) (05/21 0733) Pulse Rate:  [71-90] 71 (05/21 1000) Resp:  [10-18] 13 (05/21 1000) BP: (112-131)/(61-74) 116/65 (05/21 1000) SpO2:  [94 %-100 %] 100 % (05/21 1000) Weight:  [84.6 kg-85.4 kg] 85.4 kg (05/21 0839)  Weight change:  Filed Weights   08/09/22 0724 08/10/22 0717 08/10/22 0839  Weight: 84.1 kg 84.6 kg 85.4 kg    Intake/Output: I/O last 3 completed shifts: In: 820 [P.O.:820] Out: 1400 [Urine:1400]   Intake/Output this shift:  Total I/O In: 240 [P.O.:240] Out: 825 [Urine:825]  Physical Exam: General: NAD, seated in chair  Head: Normocephalic, atraumatic. Moist oral mucosal membranes  Eyes: Anicteric  Lungs:  Clear to auscultation  Heart: Regular rate and rhythm  Abdomen:  Soft, nontender,   Extremities:  no peripheral edema.  Neurologic: Nonfocal, moving all four extremities  Skin: No lesions  Access: Left AVG    Basic Metabolic Panel: Recent Labs  Lab 08/05/22 0900 08/07/22 0855  NA  133* 132*  K 3.1* 3.7  CL 94* 94*  CO2 24 26  GLUCOSE 196* 162*  BUN 80* 76*  CREATININE 6.43* 6.55*  CALCIUM 9.0 9.0  PHOS 5.7* 5.7*     Liver Function Tests: Recent Labs  Lab 08/05/22 0900 08/07/22 0855  ALBUMIN 3.9 4.1    No results for input(s): "LIPASE", "AMYLASE" in the last 168 hours. No results for input(s): "AMMONIA" in the last 168 hours.  CBC: Recent Labs  Lab 08/05/22 0900 08/07/22 0855 08/10/22 1000  WBC 4.8 4.9 3.9*  HGB 10.7* 10.5* 10.0*  HCT 30.6* 29.3* 27.6*  MCV 99.7 97.7 95.8  PLT 125* 115* 120*     Cardiac Enzymes: No results for input(s): "CKTOTAL", "CKMB", "CKMBINDEX", "TROPONINI" in the last 168 hours.  BNP: Invalid input(s): "POCBNP"  CBG: No results for input(s): "GLUCAP" in the last 168 hours.  Microbiology: Results for orders placed or performed in visit on 05/12/22  Microscopic Examination     Status: Abnormal   Collection Time: 05/12/22 10:39 AM   Urine  Result Value Ref Range Status   WBC, UA 6-10 (A) 0 - 5 /hpf Final   RBC, Urine 0-2 0 - 2 /hpf Final   Epithelial Cells (non renal) 0-10 0 - 10 /hpf Final   Bacteria, UA Moderate (A) None seen/Few Final    Coagulation Studies: No results for input(s): "LABPROT", "INR" in the last 72 hours.  Urinalysis: No results for input(s): "COLORURINE", "LABSPEC", "PHURINE", "GLUCOSEU", "HGBUR", "BILIRUBINUR", "KETONESUR", "PROTEINUR", "UROBILINOGEN", "NITRITE", "LEUKOCYTESUR" in the last 72 hours.  Invalid input(s): "APPERANCEUR"    Imaging: No  results found.   Medications:     allopurinol  100 mg Oral BID   amLODipine  5 mg Oral Daily   ARIPiprazole  5 mg Oral QPC breakfast   benazepril  40 mg Oral Daily   buPROPion  150 mg Oral Daily   cholecalciferol  1,000 Units Oral Daily   doxepin  50 mg Oral QHS   loratadine  10 mg Oral Daily   mirtazapine  15 mg Oral QHS   potassium chloride  10 mEq Oral Daily   rosuvastatin  10 mg Oral Daily   acetaminophen, alteplase, alum  & mag hydroxide-simeth, fluticasone, heparin, lidocaine (PF), lidocaine-prilocaine, pentafluoroprop-tetrafluoroeth, polyethylene glycol, traZODone  Assessment/ Plan:  Mr. Kalen Drees. is a 77 y.o. white male with end stage renal disease on hemodialysis, hypertension, gout, depression, history of melanoma, crohn's, and CVA who presents to [facility] on 07/30/2022 for Bipolar depression (HCC) [F31.9]  [facility] [address] Left AVG 87kg  End Stage Renal Disease on hemodialysis: Receiving dialysis today, UF 2L as tolerated. Next treatment scheduled for Thursday. Informed patient of his options if he decides to discontinue dialysis treatments.   Anemia with chronic kidney disease: Hgb 10.0 today. No need for ESA.  Hypertension with chronic kidney disease: Hypotensive on treatment today. Current regimen of amlodipine, benazepril. Patient did not get his medications this morning prior to dialysis. - Continue holding hydralazine - Amlodipine to 5mg  daily. - Blood pressure 116/68 during dialysis  Secondary Hyperparathyroidism: outpatient PTH of 456. Awaiting am labs   LOS: 11   5/21/202410:31 AM

## 2022-08-11 ENCOUNTER — Inpatient Hospital Stay: Payer: Medicare Other | Admitting: Anesthesiology

## 2022-08-11 ENCOUNTER — Ambulatory Visit: Payer: Medicare Other

## 2022-08-11 ENCOUNTER — Encounter: Payer: Self-pay | Admitting: Psychiatry

## 2022-08-11 DIAGNOSIS — G4730 Sleep apnea, unspecified: Secondary | ICD-10-CM | POA: Diagnosis not present

## 2022-08-11 DIAGNOSIS — F319 Bipolar disorder, unspecified: Secondary | ICD-10-CM | POA: Diagnosis not present

## 2022-08-11 LAB — BASIC METABOLIC PANEL
Anion gap: 12 (ref 5–15)
BUN: 60 mg/dL — ABNORMAL HIGH (ref 8–23)
CO2: 26 mmol/L (ref 22–32)
Calcium: 9.3 mg/dL (ref 8.9–10.3)
Chloride: 92 mmol/L — ABNORMAL LOW (ref 98–111)
Creatinine, Ser: 5.37 mg/dL — ABNORMAL HIGH (ref 0.61–1.24)
GFR, Estimated: 10 mL/min — ABNORMAL LOW (ref >60)
Glucose, Bld: 101 mg/dL — ABNORMAL HIGH (ref 70–99)
Potassium: 4.5 mmol/L (ref 3.5–5.1)
Sodium: 130 mmol/L — ABNORMAL LOW (ref 135–145)

## 2022-08-11 LAB — GLUCOSE, CAPILLARY: Glucose-Capillary: 92 mg/dL (ref 70–99)

## 2022-08-11 MED ORDER — SUCCINYLCHOLINE CHLORIDE 200 MG/10ML IV SOSY
PREFILLED_SYRINGE | INTRAVENOUS | Status: DC | PRN
  Administered 2022-08-11: 100 mg via INTRAVENOUS

## 2022-08-11 MED ORDER — KETAMINE HCL 50 MG/5ML IJ SOSY
PREFILLED_SYRINGE | INTRAMUSCULAR | Status: AC
  Filled 2022-08-11: qty 5

## 2022-08-11 MED ORDER — PROPOFOL 10 MG/ML IV BOLUS
INTRAVENOUS | Status: DC | PRN
  Administered 2022-08-11: 40 mg via INTRAVENOUS

## 2022-08-11 MED ORDER — KETAMINE HCL 10 MG/ML IJ SOLN
INTRAMUSCULAR | Status: DC | PRN
  Administered 2022-08-11: 40 mg via INTRAVENOUS

## 2022-08-11 MED ORDER — SODIUM CHLORIDE 0.9 % IV SOLN: 500.0000 mL | Freq: Once | INTRAVENOUS | Status: AC

## 2022-08-11 MED ORDER — PROPOFOL 10 MG/ML IV BOLUS
INTRAVENOUS | Status: AC
  Filled 2022-08-11: qty 20

## 2022-08-11 NOTE — Anesthesia Postprocedure Evaluation (Signed)
Anesthesia Post Note  Patient: Luis Miller.  Procedure(s) Performed: ECT TX  Patient location during evaluation: PACU Anesthesia Type: General Level of consciousness: awake and alert Pain management: pain level controlled Vital Signs Assessment: post-procedure vital signs reviewed and stable Respiratory status: spontaneous breathing, nonlabored ventilation, respiratory function stable and patient connected to nasal cannula oxygen Cardiovascular status: blood pressure returned to baseline and stable Postop Assessment: no apparent nausea or vomiting Anesthetic complications: no   No notable events documented.   Last Vitals:  Vitals:   08/11/22 1230 08/11/22 1300  BP: (!) 147/78 131/77  Pulse: 81 82  Resp: 13   Temp: 36.9 C   SpO2: 99%     Last Pain:  Vitals:   08/11/22 1230  TempSrc:   PainSc: 0-No pain                 Cleda Mccreedy Taressa Rauh

## 2022-08-11 NOTE — Procedures (Signed)
ECT SERVICES Physician's Interval Evaluation & Treatment Note  Patient Identification: Luis Miller. MRN:  [id number] Date of Evaluation:  08/11/2022 TX #: 1  MADRS:   MMSE:   P.E. Findings:  Patient is on dialysis but no significant new physical findings.  Blood pressure stable.  Psychiatric Interval Note:  Severely depressed and anxious  Subjective:  Patient is a 77 y.o. male seen for evaluation for Electroconvulsive Therapy. Feeling very down and negative and hopeless  Treatment Summary:   [x]   Right Unilateral             []  Bilateral   % Energy : 0.3 ms 40%   Impedance: 1240 ohms  Seizure Energy Index: No reading  Postictal Suppression Index: No reading  Seizure Concordance Index: 61%  Medications  Pre Shock: Propofol 40 mg ketamine 40 mg succinylcholine 100 mg  Post Shock:    Seizure Duration: 11 seconds EMG 21 seconds EEG   Comments: Well-tolerated no complications.  Follow up next treatment Friday anticipate increasing energy.  Lungs:  [x]   Clear to auscultation               []  Other:   Heart:    [x]   Regular rhythm             []  irregular rhythm    [x]   Previous H&P reviewed, patient examined and there are NO CHANGES                 []   Previous H&P reviewed, patient examined and there are changes noted.   Mordecai Rasmussen, MD 5/22/20244:34 PM

## 2022-08-11 NOTE — Anesthesia Procedure Notes (Signed)
Date/Time: 08/11/2022 12:00 PM  Performed by: Stormy Fabian, CRNAPre-anesthesia Checklist: Patient identified, Emergency Drugs available, Suction available and Patient being monitored Patient Re-evaluated:Patient Re-evaluated prior to induction Oxygen Delivery Method: Circle system utilized Preoxygenation: Pre-oxygenation with 100% oxygen Induction Type: IV induction Ventilation: Mask ventilation without difficulty and Mask ventilation throughout procedure Airway Equipment and Method: Bite block Placement Confirmation: positive ETCO2 Dental Injury: Teeth and Oropharynx as per pre-operative assessment
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
## 2022-08-11 NOTE — Progress Notes (Signed)
Patient is A+O x 4. He denies SI/HI/AVH. He endorses mild anxiety due to upcoming ECT treatment. Denies depression. Adherent to medications. ECT performed and post ECT questions completed on flowsheet. Patient was A+O x 4 upon return. Patient remains on strict I&O's.  Tylenol 650 mg adm for complaints of 8/10 headache at 1352. Upon follow up, pain decreased to a 4. Patient attended SW group but chose to rest in room when a second group was held in the courtyard. Patient will have dialysis tomorrow 05.22 and renal function/ CBC drawn.  Q15 minute unit checks in place.

## 2022-08-11 NOTE — Group Note (Signed)
[facility] LCSW Group Therapy Note   Group Date: 08/11/2022 Start Time: 1315 End Time: 1345   Type of Therapy/Topic:  Group Therapy:  Emotion Regulation  Participation Level:  Minimal   Mood:  Description of Group:    The purpose of this group is to assist patients in learning to regulate negative emotions and experience positive emotions. Patients will be guided to discuss ways in which they have been vulnerable to their negative emotions. These vulnerabilities will be juxtaposed with experiences of positive emotions or situations, and patients challenged to use positive emotions to combat negative ones. Special emphasis will be placed on coping with negative emotions in conflict situations, and patients will process healthy conflict resolution skills.  Therapeutic Goals: Patient will identify two positive emotions or experiences to reflect on in order to balance out negative emotions:  Patient will label two or more emotions that they find the most difficult to experience:  Patient will be able to demonstrate positive conflict resolution skills through discussion or role plays:   Summary of Patient Progress: Patient was present in group.  Patient did not engage in group discussion much as he was eating lunch after returning from ECT.  Patient stated that he had a headache and that was affecting participation.  Patient reports that he has used music as a deescalation, specifically music from the 1950's.      Therapeutic Modalities:   Cognitive Behavioral Therapy Feelings Identification Dialectical Behavioral Therapy   Harden Mo, LCSW

## 2022-08-11 NOTE — Progress Notes (Signed)
   08/11/22 2100  Psych Admission Type (Psych Patients Only)  Admission Status Voluntary  Psychosocial Assessment  Patient Complaints Other (Comment) ("I am feeling okay right now")  Eye Contact Fair  Facial Expression Flat  Affect Appropriate to circumstance  Speech Logical/coherent  Interaction Assertive  Motor Activity Slow  Appearance/Hygiene Unremarkable  Behavior Characteristics Cooperative;Appropriate to situation  Mood Pleasant  Thought Process  Coherency WDL  Content WDL  Delusions None reported or observed  Perception WDL  Hallucination None reported or observed  Judgment Impaired  Confusion None  Danger to Self  Current suicidal ideation? Denies  Danger to Others  Danger to Others None reported or observed

## 2022-08-11 NOTE — Progress Notes (Signed)
This RN stood in the bathroom with pt as the patient used Journalist, newspaper. Patient shaved completed, and razor obtained by this RN, clean and placed back in nurse's station.
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
## 2022-08-11 NOTE — Plan of Care (Signed)
  Problem: Education: Goal: Knowledge of General Education information will improve Description: Including pain rating scale, medication(s)/side effects and non-pharmacologic comfort measures Outcome: Progressing   Problem: Health Behavior/Discharge Planning: Goal: Ability to manage health-related needs will improve Outcome: Progressing   Problem: Clinical Measurements: Goal: Respiratory complications will improve Outcome: Progressing   

## 2022-08-11 NOTE — Group Note (Signed)
Date:  08/11/2022 Time:  8:45 PM  Group Topic/Focus:  Self Esteem Action Plan:   The focus of this group is to help patients create a plan to continue to build self-esteem after discharge.    Participation Level:  Active  Participation Quality:  Sharing  Affect:  Flat  Cognitive:  Confused  Insight: Lacking  Engagement in Group:  Engaged  Modes of Intervention:  Activity and Discussion  Additional Comments:    KAYCEN DUFFY 08/11/2022, 8:45 PM

## 2022-08-11 NOTE — Progress Notes (Signed)
   08/11/22 1610  15 Minute Checks  Location Dayroom  Visual Appearance Calm  Behavior Composed  Sleep (Behavioral Health Patients Only)  Calculate sleep? (Click Yes once per 24 hr at 0600 safety check) Yes  Documented sleep last 24 hours 8.25

## 2022-08-11 NOTE — Transfer of Care (Signed)
Immediate Anesthesia Transfer of Care Note  Patient: Luis Miller.  Procedure(s) Performed: ECT TX  Patient Location: PACU  Anesthesia Type:General  Level of Consciousness: drowsy  Airway & Oxygen Therapy: Patient Spontanous Breathing and Patient connected to nasal cannula oxygen  Post-op Assessment: Report given to RN  Post vital signs: stable  Last Vitals:  Vitals Value Taken Time  BP 176/88 08/11/22 1202  Temp 36.8 C 08/11/22 1202  Pulse 79 08/11/22 1208  Resp 20 08/11/22 1208  SpO2 100 % 08/11/22 1208  Vitals shown include unvalidated device data.  Last Pain:  Vitals:   08/11/22 1202  TempSrc:   PainSc: Asleep         Complications: No notable events documented.

## 2022-08-12 DIAGNOSIS — F319 Bipolar disorder, unspecified: Secondary | ICD-10-CM | POA: Diagnosis not present

## 2022-08-12 DIAGNOSIS — N186 End stage renal disease: Secondary | ICD-10-CM | POA: Diagnosis not present

## 2022-08-12 DIAGNOSIS — D631 Anemia in chronic kidney disease: Secondary | ICD-10-CM | POA: Diagnosis not present

## 2022-08-12 DIAGNOSIS — I10 Essential (primary) hypertension: Secondary | ICD-10-CM | POA: Diagnosis not present

## 2022-08-12 LAB — CBC
HCT: 27.3 % — ABNORMAL LOW (ref 39.0–52.0)
Hemoglobin: 9.9 g/dL — ABNORMAL LOW (ref 13.0–17.0)
MCH: 35 pg — ABNORMAL HIGH (ref 26.0–34.0)
MCHC: 36.3 g/dL — ABNORMAL HIGH (ref 30.0–36.0)
MCV: 96.5 fL (ref 80.0–100.0)
Platelets: 115 10*3/uL — ABNORMAL LOW (ref 150–400)
RBC: 2.83 MIL/uL — ABNORMAL LOW (ref 4.22–5.81)
RDW: 12.3 % (ref 11.5–15.5)
WBC: 5.5 10*3/uL (ref 4.0–10.5)
nRBC: 0 % (ref 0.0–0.2)

## 2022-08-12 LAB — RENAL FUNCTION PANEL
Albumin: 3.9 g/dL (ref 3.5–5.0)
Anion gap: 13 (ref 5–15)
BUN: 73 mg/dL — ABNORMAL HIGH (ref 8–23)
CO2: 23 mmol/L (ref 22–32)
Calcium: 9.1 mg/dL (ref 8.9–10.3)
Chloride: 94 mmol/L — ABNORMAL LOW (ref 98–111)
Creatinine, Ser: 6.28 mg/dL — ABNORMAL HIGH (ref 0.61–1.24)
GFR, Estimated: 9 mL/min — ABNORMAL LOW (ref >60)
Glucose, Bld: 95 mg/dL (ref 70–99)
Phosphorus: 5.8 mg/dL — ABNORMAL HIGH (ref 2.5–4.6)
Potassium: 3.9 mmol/L (ref 3.5–5.1)
Sodium: 130 mmol/L — ABNORMAL LOW (ref 135–145)

## 2022-08-12 MED ORDER — NEPRO/CARBSTEADY PO LIQD
237.0000 mL | ORAL | Status: DC
  Administered 2022-08-12 – 2022-09-25 (×38): 237 mL via ORAL

## 2022-08-12 NOTE — Progress Notes (Signed)
   08/12/22 0600  15 Minute Checks  Location Bedroom  Visual Appearance Calm  Behavior Sleeping  Sleep (Behavioral Health Patients Only)  Calculate sleep? (Click Yes once per 24 hr at 0600 safety check) Yes  Documented sleep last 24 hours 6.25

## 2022-08-12 NOTE — Group Note (Signed)
Recreation Therapy Group Note   Group Topic:Self-Esteem  Group Date: 08/12/2022 Start Time: 1400 End Time: 1455 Facilitators: Rosina Lowenstein, LRT, CTRS Location:  Dayroom  Group Description: Patients and LRT discussed the importance of self-love and self-esteem. Pt completed a worksheet that helps them identify 24 different strengths and qualities about themselves. Pt encouraged to read aloud at least 3 off their sheet to the group. LRT and pts discussed how this can be applied to daily life post-discharge.  Pt's then played "Positive Affirmation Bingo" afterwards, with journals, activity books or stress balls as bingo prizes.  Goal Area(s) Addressed: Patient will identify positive qualities about themselves. Patient will learn new positive affirmations.  Patient will recite positive qualities and affirmations aloud to the group.  Patient will increase communication.  Affect/Mood: Appropriate and Flat   Participation Level: Active and Engaged   Participation Quality: Independent   Behavior: Calm and Cooperative   Speech/Thought Process: Coherent   Insight: Good   Judgement: Good   Modes of Intervention: Activity and Worksheet   Patient Response to Interventions:  Attentive, Engaged, Interested , and Receptive   Education Outcome:  Acknowledges education   Clinical Observations/Individualized Feedback: Luis Miller was active in their participation of session activities and group discussion. Pt identified "I am good at hands on projects and anything mechanical. A challenge I have overcome is mental health. I have helped others by changing someone's oil. Things I value the most are friendship and music." Pt spontaneously contributed to group discussion. Pt won a Facilities manager and chose an adult coloring book as his prize. Pt interacted well with LRT and peers duration of session.    Plan: Continue to engage patient in RT group sessions 2-3x/week.   Rosina Lowenstein, LRT,  CTRS 08/12/2022 3:29 PM

## 2022-08-12 NOTE — Progress Notes (Signed)
Patient has returned from dialysis 

## 2022-08-12 NOTE — Progress Notes (Signed)
[facility]  ROUNDING NOTE   Subjective:   Patient seen and evaluated during dialysis   HEMODIALYSIS FLOWSHEET:  Blood Flow Rate (mL/min): 400 mL/min Arterial Pressure (mmHg): -210 mmHg Venous Pressure (mmHg): 320 mmHg TMP (mmHg): 20 mmHg Ultrafiltration Rate (mL/min): 767 mL/min Dialysate Flow Rate (mL/min): 300 ml/min Dialysis Fluid Bolus: Normal Saline Bolus Amount (mL): 100 mL  Patient states he feels the same, does not feel like he is improving.  Objective:  Vital signs in last 24 hours:  Temp:  [97.8 F (36.6 C)-99.7 F (37.6 C)] 97.8 F (36.6 C) (05/23 0753) Pulse Rate:  [61-92] 67 (05/23 1100) Resp:  [12-20] 13 (05/23 1100) BP: (96-176)/(56-96) 109/67 (05/23 1100) SpO2:  [90 %-100 %] 100 % (05/23 1100) Weight:  [83.7 kg] 83.7 kg (05/23 0750)  Weight change:  Filed Weights   08/10/22 0839 08/10/22 1403 08/12/22 0750  Weight: 85.4 kg 82.9 kg 83.7 kg    Intake/Output: I/O last 3 completed shifts: In: 820 [P.O.:810; I.V.:10] Out: 650 [Urine:650]   Intake/Output this shift:  Total I/O In: -  Out: 500 [Urine:500]  Physical Exam: General: NAD, seated in chair  Head: Normocephalic, atraumatic. Moist oral mucosal membranes  Eyes: Anicteric  Lungs:  Clear to auscultation  Heart: Regular rate and rhythm  Abdomen:  Soft, nontender,   Extremities:  no peripheral edema.  Neurologic: Nonfocal, moving all four extremities  Skin: No lesions  Access: Left AVG    Basic Metabolic Panel: Recent Labs  Lab 08/07/22 0855 08/10/22 1000 08/11/22 1034 08/12/22 0816  NA 132* 128* 130* 130*  K 3.7 3.6 4.5 3.9  CL 94* 90* 92* 94*  CO2 26 23 26 23   GLUCOSE 162* 120* 101* 95  BUN 76* 93* 60* 73*  CREATININE 6.55* 6.99* 5.37* 6.28*  CALCIUM 9.0 8.9 9.3 9.1  PHOS 5.7* 6.3*  --  5.8*     Liver Function Tests: Recent Labs  Lab 08/07/22 0855 08/10/22 1000 08/12/22 0816  ALBUMIN 4.1 3.9 3.9    No results for input(s): "LIPASE", "AMYLASE" in the  last 168 hours. No results for input(s): "AMMONIA" in the last 168 hours.  CBC: Recent Labs  Lab 08/07/22 0855 08/10/22 1000 08/12/22 0816  WBC 4.9 3.9* 5.5  HGB 10.5* 10.0* 9.9*  HCT 29.3* 27.6* 27.3*  MCV 97.7 95.8 96.5  PLT 115* 120* 115*     Cardiac Enzymes: No results for input(s): "CKTOTAL", "CKMB", "CKMBINDEX", "TROPONINI" in the last 168 hours.  BNP: Invalid input(s): "POCBNP"  CBG: Recent Labs  Lab 08/11/22 1210  GLUCAP 92    Microbiology: Results for orders placed or performed in visit on 05/12/22  Microscopic Examination     Status: Abnormal   Collection Time: 05/12/22 10:39 AM   Urine  Result Value Ref Range Status   WBC, UA 6-10 (A) 0 - 5 /hpf Final   RBC, Urine 0-2 0 - 2 /hpf Final   Epithelial Cells (non renal) 0-10 0 - 10 /hpf Final   Bacteria, UA Moderate (A) None seen/Few Final    Coagulation Studies: No results for input(s): "LABPROT", "INR" in the last 72 hours.  Urinalysis: No results for input(s): "COLORURINE", "LABSPEC", "PHURINE", "GLUCOSEU", "HGBUR", "BILIRUBINUR", "KETONESUR", "PROTEINUR", "UROBILINOGEN", "NITRITE", "LEUKOCYTESUR" in the last 72 hours.  Invalid input(s): "APPERANCEUR"    Imaging: No results found.   Medications:     allopurinol  100 mg Oral BID   amLODipine  5 mg Oral QPM   ARIPiprazole  5 mg Oral QPC breakfast  benazepril  10 mg Oral QPM   buPROPion  150 mg Oral Daily   cholecalciferol  1,000 Units Oral Daily   doxepin  50 mg Oral QHS   loratadine  10 mg Oral Daily   mirtazapine  15 mg Oral QHS   potassium chloride  10 mEq Oral Daily   rosuvastatin  10 mg Oral Daily   acetaminophen, alteplase, alum & mag hydroxide-simeth, fluticasone, heparin, lidocaine (PF), lidocaine-prilocaine, pentafluoroprop-tetrafluoroeth, polyethylene glycol, traZODone  Assessment/ Plan:  [name]. is a 77 y.o. white male with end stage renal disease on hemodialysis, hypertension, gout, depression, history of  melanoma, crohn's, and CVA who presents to [facility] on 07/30/2022 for Bipolar depression (HCC) [F31.9]  [facility] [address] Left AVG 87kg  End Stage Renal Disease on hemodialysis: Receiving dialysis today, UF 2L as tolerated. Next treatment scheduled for Saturday.   Anemia with chronic kidney disease: Hgb 9.9, at goal  Hypertension with chronic kidney disease: Hypotensive on treatment today. Current regimen of amlodipine, benazepril. Patient did not get his medications this morning prior to dialysis. - Continue holding hydralazine - Amlodipine to 5mg  daily. - Blood pressure 104/67 during dialysis  Secondary Hyperparathyroidism: outpatient PTH of 456. Calcium at goal. Phosphorus elevated, but improving.    LOS: 13   5/23/202411:15 AM
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
## 2022-08-12 NOTE — Progress Notes (Signed)
Patient is alert and oriented and denies anxiety and depression. Denies SI/HI/AVH. Appetite good. Patient received dialysis treatment today. He remains on strict I&O's/1200 mL fluid restriction.  Amlodipine 5 mg and benazepril 10 mg held due to hypotension. Amlodipine eventually dc'd from scheduled meds. PSA lab draw and ECT tomorrow morning 05.24.  Q15 minute unit checks in place.

## 2022-08-12 NOTE — Progress Notes (Signed)
Patient states, "I just feel like I am not getting better, everybody can write and do things and I can barely do things. I can't write. I dont remember my doctors name. I fear if I leave here I have to go to a home because I can't take care of myself." Encouragement and supportive listening provided.

## 2022-08-12 NOTE — Progress Notes (Signed)
Patient wheeled off unit by MHT to dialysis.

## 2022-08-12 NOTE — Progress Notes (Signed)
  Received patient in bed to unit.   Informed consent signed and in chart.    TX duration:3.5hrs     Transported back to floor  Hand-off given to patient's nurse. No c/o and no distress noted    Access used: LAVG Access issues: none   Total UF removed: 2.0kg Medication(s) given: none Post HD VS: 113/72 Post HD weight: 82.0kg     Lynann Beaver  Kidney Dialysis Unit

## 2022-08-12 NOTE — Progress Notes (Signed)
Patient offsite to dialysis.

## 2022-08-12 NOTE — Plan of Care (Signed)
  Problem: Education: Goal: Knowledge of General Education information will improve Description: Including pain rating scale, medication(s)/side effects and non-pharmacologic comfort measures Outcome: Progressing   Problem: Health Behavior/Discharge Planning: Goal: Ability to manage health-related needs will improve Outcome: Progressing   Problem: Pain Managment: Goal: General experience of comfort will improve Outcome: Progressing   

## 2022-08-12 NOTE — Group Note (Signed)
LCSW Group Therapy Note  Group Date: 08/12/2022 Start Time: 1310 End Time: 1355   Type of Therapy and Topic:  Group Therapy - Healthy vs Unhealthy Coping Skills  Participation Level:  Minimal   Description of Group The focus of this group was to determine what unhealthy coping techniques typically are used by group members and what healthy coping techniques would be helpful in coping with various problems. Patients were guided in becoming aware of the differences between healthy and unhealthy coping techniques. Patients were asked to identify 2-3 healthy coping skills they would like to learn to use more effectively.  Therapeutic Goals Patients learned that coping is what human beings do all day long to deal with various situations in their lives Patients defined and discussed healthy vs unhealthy coping techniques Patients identified their preferred coping techniques and identified whether these were healthy or unhealthy Patients determined 2-3 healthy coping skills they would like to become more familiar with and use more often. Patients provided support and ideas to each other   Summary of Patient Progress:   During group, patient expressed that he struggles with prioritizing his self. Patient proved open to input from peers and feedback from CSW. Patient demonstrated fair insight into the subject matter, was respectful of peers, and participated throughout the entire session.  Therapeutic Modalities Cognitive Behavioral Therapy Motivational Interviewing  Claudie Fisherman 08/12/2022  2:56 PM

## 2022-08-13 ENCOUNTER — Other Ambulatory Visit: Payer: Self-pay | Admitting: Psychiatry

## 2022-08-13 ENCOUNTER — Ambulatory Visit: Payer: Medicare Other

## 2022-08-13 ENCOUNTER — Inpatient Hospital Stay: Payer: Medicare Other | Admitting: Anesthesiology

## 2022-08-13 ENCOUNTER — Encounter: Payer: Self-pay | Admitting: Psychiatry

## 2022-08-13 DIAGNOSIS — F319 Bipolar disorder, unspecified: Secondary | ICD-10-CM | POA: Diagnosis not present

## 2022-08-13 DIAGNOSIS — E785 Hyperlipidemia, unspecified: Secondary | ICD-10-CM | POA: Diagnosis not present

## 2022-08-13 LAB — GLUCOSE, CAPILLARY: Glucose-Capillary: 92 mg/dL (ref 70–99)

## 2022-08-13 MED ORDER — SUCCINYLCHOLINE CHLORIDE 200 MG/10ML IV SOSY
PREFILLED_SYRINGE | INTRAVENOUS | Status: DC | PRN
  Administered 2022-08-13: 100 mg via INTRAVENOUS

## 2022-08-13 MED ORDER — LABETALOL HCL 5 MG/ML IV SOLN
INTRAVENOUS | Status: DC | PRN
  Administered 2022-08-13: 5 mg via INTRAVENOUS

## 2022-08-13 MED ORDER — KETAMINE HCL 10 MG/ML IJ SOLN
INTRAMUSCULAR | Status: DC | PRN
  Administered 2022-08-13: 40 mg via INTRAVENOUS

## 2022-08-13 MED ORDER — PROPOFOL 10 MG/ML IV BOLUS
INTRAVENOUS | Status: DC | PRN
  Administered 2022-08-13: 40 mg via INTRAVENOUS

## 2022-08-13 MED ORDER — KETAMINE HCL 50 MG/5ML IJ SOSY
PREFILLED_SYRINGE | INTRAMUSCULAR | Status: AC
  Filled 2022-08-13: qty 5

## 2022-08-13 MED ORDER — FENTANYL CITRATE PF 50 MCG/ML IJ SOSY: 25.0000 ug | PREFILLED_SYRINGE | INTRAMUSCULAR | Status: DC | PRN

## 2022-08-13 MED ORDER — ONDANSETRON HCL 4 MG/2ML IJ SOLN: 4.0000 mg | Freq: Once | INTRAMUSCULAR | Status: DC | PRN

## 2022-08-13 MED ORDER — SODIUM CHLORIDE 0.9 % IV SOLN: 500.0000 mL | Freq: Once | INTRAVENOUS | Status: AC

## 2022-08-13 NOTE — Group Note (Signed)
Date:  08/13/2022 Time:  3:50 PM  Group Topic/Focus:  Outside game group. Learning sportsmanship, cheering each other on. Supporting and promoting positivity.     Participation Level:  Did Not Attend   Doug Sou 08/13/2022, 3:50 PM

## 2022-08-13 NOTE — Procedures (Signed)
ECT SERVICES Physician's Interval Evaluation & Treatment Note  Patient Identification: Luis Miller. MRN:  [id number] Date of Evaluation:  08/13/2022 TX #: 2  MADRS:   MMSE:   P.E. Findings:  No change to physical exam  Psychiatric Interval Note:  Mood stated as being about the same.  Seems objectively a little less anxious  Subjective:  Patient is a 77 y.o. male seen for evaluation for Electroconvulsive Therapy. No complaints  Treatment Summary:   [x]   Right Unilateral             []  Bilateral   % Energy : 0.3 ms 70%   Impedance: 1750 ohms  Seizure Energy Index: 899 V squared  Postictal Suppression Index: No reading  Seizure Concordance Index: 96%  Medications  Pre Shock: Propofol 40 mg ketamine 40 mg succinylcholine 100 mg  Post Shock: Labetalol 10 mg  Seizure Duration: 13 seconds EMG 18 seconds EEG   Comments: Next treatment Wednesday and at that time we will use all ketamine  Lungs:  [x]   Clear to auscultation               []  Other:   Heart:    [x]   Regular rhythm             []  irregular rhythm    [x]   Previous H&P reviewed, patient examined and there are NO CHANGES                 []   Previous H&P reviewed, patient examined and there are changes noted.   Mordecai Rasmussen, MD 5/24/20243:42 PM

## 2022-08-13 NOTE — Anesthesia Postprocedure Evaluation (Signed)
Anesthesia Post Note  Patient: Luis Miller.  Procedure(s) Performed: ECT TX  Patient location during evaluation: PACU Anesthesia Type: General Level of consciousness: sedated Pain management: satisfactory to patient Vital Signs Assessment: post-procedure vital signs reviewed and stable Respiratory status: spontaneous breathing and nonlabored ventilation Cardiovascular status: stable Anesthetic complications: no   No notable events documented.   Last Vitals:  Vitals:   08/13/22 1208 08/13/22 1313  BP: 136/86 (!) 174/90  Pulse: 80 74  Resp: 18 16  Temp: 37.1 C 36.7 C  SpO2: 98% 100%    Last Pain:  Vitals:   08/13/22 1313  TempSrc:   PainSc: 0-No pain                 VAN STAVEREN,TRUE [place]

## 2022-08-13 NOTE — Group Note (Signed)
Date:  08/13/2022 Time:  10:20 PM  Group Topic/Focus:  Goals Group:   The focus of this group is to help patients establish daily goals to achieve during treatment and discuss how the patient can incorporate goal setting into their daily lives to aide in recovery.    Participation Level:  Active  Participation Quality:  Appropriate  Affect:  Appropriate  Cognitive:  Alert  Insight: Appropriate  Engagement in Group:  Engaged  Modes of Intervention:  Education  Additional Comments:    Garry Heater 08/13/2022, 10:20 PM

## 2022-08-13 NOTE — BH IP Treatment Plan (Signed)
Interdisciplinary Treatment and Diagnostic Plan Update  08/13/2022 Time of Session: 9:410AM Luis Miller. MRN: [id number]  Principal Diagnosis: Bipolar depression (HCC)  Secondary Diagnoses: Principal Problem:   Bipolar depression (HCC)   Current Medications:  Current Facility-Administered Medications  Medication Dose Route Frequency Provider Last Rate Last Admin   acetaminophen (TYLENOL) tablet 650 mg  650 mg Oral Q6H PRN Clapacs, [name], MD   650 mg at 08/11/22 1352   allopurinol (ZYLOPRIM) tablet 100 mg  100 mg Oral BID Clapacs, [name], MD   100 mg at 08/12/22 2110   alteplase (CATHFLO ACTIVASE) injection 2 mg  2 mg Intracatheter Once PRN Lateef, Munsoor, MD       alum & mag hydroxide-simeth (MAALOX/MYLANTA) 200-200-20 MG/5ML suspension 30 mL  30 mL Oral Q4H PRN Clapacs, [name], MD       ARIPiprazole (ABILIFY) tablet 5 mg  5 mg Oral QPC breakfast [name], DO   5 mg at 08/12/22 1305   benazepril (LOTENSIN) tablet 10 mg  10 mg Oral QPM Mosetta Pigeon, MD   10 mg at 08/11/22 1722   buPROPion (WELLBUTRIN XL) 24 hr tablet 150 mg  150 mg Oral Daily [name], DO   150 mg at 08/12/22 1305   cholecalciferol (VITAMIN D3) 25 MCG (1000 UNIT) tablet 1,000 Units  1,000 Units Oral Daily Clapacs, [name], MD   1,000 Units at 08/12/22 1305   doxepin (SINEQUAN) capsule 50 mg  50 mg Oral QHS [name], DO   50 mg at 08/12/22 2110   feeding supplement (NEPRO CARB STEADY) liquid 237 mL  237 mL Oral Q24H Singh, Harmeet, MD   237 mL at 08/12/22 1430   fluticasone (FLONASE) 50 MCG/ACT nasal spray 2 spray  2 spray Each Nare Daily PRN Clapacs, [name], MD       heparin injection 1,000 Units  1,000 Units Dialysis PRN Lateef, Munsoor, MD       lidocaine (PF) (XYLOCAINE) 1 % injection 5 mL  5 mL Intradermal PRN Lateef, Munsoor, MD       lidocaine-prilocaine (EMLA) cream 1 Application  1 Application Topical PRN Lateef, Munsoor, MD   1 Application at 08/12/22 0700    loratadine (CLARITIN) tablet 10 mg  10 mg Oral Daily Clapacs, [name], MD   10 mg at 08/12/22 1305   mirtazapine (REMERON) tablet 15 mg  15 mg Oral QHS [name], DO   15 mg at 08/12/22 2110   pentafluoroprop-tetrafluoroeth (GEBAUERS) aerosol 1 Application  1 Application Topical PRN Lateef, Munsoor, MD       polyethylene glycol (MIRALAX / GLYCOLAX) packet 17 g  17 g Oral Daily PRN Sharen Hones, RPH   17 g at 08/08/22 4098   potassium chloride (KLOR-CON M) CR tablet 10 mEq  10 mEq Oral Daily [name], DO   10 mEq at 08/12/22 1305   rosuvastatin (CRESTOR) tablet 10 mg  10 mg Oral Daily Clapacs, [name], MD   10 mg at 08/12/22 1305   traZODone (DESYREL) tablet 50 mg  50 mg Oral QHS PRN [name], DO   50 mg at 08/11/22 2100   PTA Medications: Medications Prior to Admission  Medication Sig Dispense Refill Last Dose   allopurinol (ZYLOPRIM) 100 MG tablet Take 100 mg by mouth 2 (two) times daily.      amLODipine (NORVASC) 10 MG tablet TAKE 1 TABLET DAILY 90 tablet 1    benazepril (LOTENSIN)  40 MG tablet Take 40 mg by mouth daily.       calcitRIOL (ROCALTROL) 0.25 MCG capsule Take 0.25 mcg by mouth daily. (Patient not taking: Reported on 07/30/2022)      FLUoxetine (PROZAC) 20 MG capsule Take 1 capsule (20 mg total) by mouth daily. 90 capsule 0    fluticasone (FLONASE) 50 MCG/ACT nasal spray Place 2 sprays into both nostrils daily. 16 g 6    hydrALAZINE (APRESOLINE) 100 MG tablet Take 100 mg by mouth 3 (three) times daily.      lamoTRIgine (LAMICTAL) 25 MG tablet TAKE 1 TABLET DAILY 90 tablet 0    loperamide (IMODIUM) 2 MG capsule Take 2 mg by mouth daily as needed for diarrhea or loose stools.      loratadine (CLARITIN) 10 MG tablet TAKE 1 TABLET BY MOUTH EVERY DAY 90 tablet 1    Multiple Vitamin (MULTIVITAMIN) tablet Take 1 tablet by mouth daily.      OLANZapine (ZYPREXA) 5 MG tablet Take 1 tablet (5 mg total) by mouth at bedtime. 90 tablet 0     rosuvastatin (CRESTOR) 10 MG tablet TAKE 1 TABLET BY MOUTH EVERY DAY 90 tablet 0    Vitamin D, Cholecalciferol, 25 MCG (1000 UT) TABS Take 1,000 tablets by mouth daily.        Patient Stressors: Medication change or noncompliance    Patient Strengths: Ability for insight  Average or above average intelligence  Capable of independent living  Communication skills  Motivation for treatment/growth  Supportive family/friends   Treatment Modalities: Medication Management, Group therapy, Case management,  1 to 1 session with clinician, Psychoeducation, Recreational therapy.   Physician Treatment Plan for Primary Diagnosis: Bipolar depression (HCC) Long Term Goal(s): Improvement in symptoms so as ready for discharge   Short Term Goals: Compliance with prescribed medications will improve Ability to verbalize feelings will improve  Medication Management: Evaluate patient's response, side effects, and tolerance of medication regimen.  Therapeutic Interventions: 1 to 1 sessions, Unit Group sessions and Medication administration.  Evaluation of Outcomes: Progressing  Physician Treatment Plan for Secondary Diagnosis: Principal Problem:   Bipolar depression (HCC)  Long Term Goal(s): Improvement in symptoms so as ready for discharge   Short Term Goals: Compliance with prescribed medications will improve Ability to verbalize feelings will improve     Medication Management: Evaluate patient's response, side effects, and tolerance of medication regimen.  Therapeutic Interventions: 1 to 1 sessions, Unit Group sessions and Medication administration.  Evaluation of Outcomes: Progressing   RN Treatment Plan for Primary Diagnosis: Bipolar depression (HCC) Long Term Goal(s): Knowledge of disease and therapeutic regimen to maintain health will improve  Short Term Goals: Ability to demonstrate self-control, Ability to participate in decision making will improve, Ability to verbalize feelings will  improve, Ability to disclose and discuss suicidal ideas, Ability to identify and develop effective coping behaviors will improve, and Compliance with prescribed medications will improve  Medication Management: RN will administer medications as ordered by provider, will assess and evaluate patient's response and provide education to patient for prescribed medication. RN will report any adverse and/or side effects to prescribing provider.  Therapeutic Interventions: 1 on 1 counseling sessions, Psychoeducation, Medication administration, Evaluate responses to treatment, Monitor vital signs and CBGs as ordered, Perform/monitor CIWA, COWS, AIMS and Fall Risk screenings as ordered, Perform wound care treatments as ordered.  Evaluation of Outcomes: Progressing   LCSW Treatment Plan for Primary Diagnosis: Bipolar depression (HCC) Long Term Goal(s): Safe transition to appropriate next level  of care at discharge, Engage patient in therapeutic group addressing interpersonal concerns.  Short Term Goals: Engage patient in aftercare planning with referrals and resources, Increase social support, Increase ability to appropriately verbalize feelings, Increase emotional regulation, Facilitate acceptance of mental health diagnosis and concerns, and Increase skills for wellness and recovery  Therapeutic Interventions: Assess for all discharge needs, 1 to 1 time with Social worker, Explore available resources and support systems, Assess for adequacy in community support network, Educate family and significant other(s) on suicide prevention, Complete Psychosocial Assessment, Interpersonal group therapy.  Evaluation of Outcomes: Progressing   Progress in Treatment: Attending groups: Yes. Participating in groups: Yes. Taking medication as prescribed: Yes. Toleration medication: Yes. Family/Significant other contact made: No, will contact:  once permission is given. Patient understands diagnosis: Yes. Discussing  patient identified problems/goals with staff: Yes. Medical problems stabilized or resolved: Yes. Denies suicidal/homicidal ideation: Yes. Issues/concerns per patient self-inventory: No. Other: none  New problem(s) identified: No, Describe:  none   New Short Term/Long Term Goal(s): medication management for mood stabilization; elimination of SI thoughts; development of comprehensive mental wellness plan. Update 08/07/22: The patient has goals are the same, no new goals reported at this time.  Update 08/13/2022:  No changes at this time.     Patient Goals:  "get out of here and get back to what I was doing before I needed to come here"  Update 08/07/22: The patient has not provided any new goals at this time.  Update 08/13/2022:  No changes at this time.     Discharge Plan or Barriers: CSW to assist patient in development of appropriate discharge plans. Patient has a current mental health therapist and reports plans to continue with her. Patient reports plans to return to his home. Update 08/07/22: The patient continues to report that he is depressed. There has been no updates to the patients discharge plan at this time.  Update 08/13/2022:  Patient continues to report that he is depressed and unable to identify what can assist patient.  CSW to assist patient in establishing an aftercare appointment to follow up with therapy.  Patient reports plans to return home.    Reason for Continuation of Hospitalization: Anxiety Depression Medication stabilization Suicidal ideation   Estimated Length of Stay: Update 08/13/2022:  TBD  Last 3 [place] Suicide Severity Risk Score: Flowsheet Row Admission (Current) from 07/30/2022 in [facility] BE[facility] Most recent reading at 08/11/2022 11:51 AM ED from 07/30/2022 in [facility] Emergency Department at [facility] Most recent reading at 07/30/2022  3:10 PM Office Visit from 07/26/2022 in confidential department Most recent reading at 07/27/2022 11:27  AM  C-SSRS RISK CATEGORY No Risk No Risk No Risk       Last PHQ 2/9 Scores:    07/26/2022    9:10 AM 02/24/2022    1:14 PM 01/25/2022    1:34 PM  Depression screen PHQ 2/9  Decreased Interest 1 0 0  Down, Depressed, Hopeless 1 0 0  PHQ - 2 Score 2 0 0  Altered sleeping 0 0 0  Tired, decreased energy 0 0 0  Change in appetite 0 0 0  Feeling bad or failure about yourself  1 0 0  Trouble concentrating 0 0 0  Moving slowly or fidgety/restless 0 0 0  Suicidal thoughts 0 0 0  PHQ-9 Score 3 0 0  Difficult doing work/chores  Not difficult at all     Scribe for Treatment Team: Harden Mo, LCSW 08/13/2022 9:41  AM

## 2022-08-13 NOTE — Group Note (Signed)
Recreation Therapy Group Note   Group Topic:Health and Wellness  Group Date: 08/13/2022 Start Time: 1400 End Time: 1445 Facilitators: Rosina Lowenstein, LRT, CTRS Location:  Dayroom  Group Description: Seated Exercise. LRT discussed the mental and physical benefits of exercise. LRT and group discussed how physical activity can be used as a coping skill. Pt's and LRT followed along to an exercise video on the TV screen that provided a visual representation and audio description of every exercise performed. Pt's encouraged to listen to their bodies and stop at any time if they experience feelings of discomfort or pain. LRT passed out water after session was over and encouraged pts do drink and stay hydrated.  Goal Area(s) Addressed: Patient will learn benefits of physical activity. Patient will identify exercise as a coping skill.  Patient will follow multistep directions. Patient will try a new leisure interest.   Affect/Mood: N/A   Participation Level: Did not attend    Clinical Observations/Individualized Feedback: Luis Miller did not attend group due to ECT. Pt came into the dayroom with about 5 minutes remaining in the video and ate lunch.   Plan: Continue to engage patient in RT group sessions 2-3x/week.   Rosina Lowenstein, LRT, CTRS 08/13/2022 3:02 PM

## 2022-08-13 NOTE — Plan of Care (Signed)
  Problem: Clinical Measurements: Goal: Will remain free from infection Outcome: Progressing Goal: Diagnostic test results will improve Outcome: Progressing   Problem: Activity: Goal: Risk for activity intolerance will decrease Outcome: Progressing   

## 2022-08-13 NOTE — Group Note (Signed)
Date:  08/13/2022 Time:  10:48 AM  Group Topic/Focus:  Personal Choices and Values:   The focus of this group is to help patients assess and explore the importance of values in their lives, how their values affect their decisions, how they express their values and what opposes their expression.    Participation Level:  Did Not Attend  P Doug Sou 08/13/2022, 10:48 AM

## 2022-08-13 NOTE — Group Note (Signed)
Date:  08/13/2022 Time:  10:44 AM  Group Topic/Focus:  Outside/courtyard time     Participation Level:  Active  Participation Quality:  Appropriate  Affect:  Appropriate  Cognitive:  Alert and Appropriate  Insight: Appropriate  Engagement in Group:  Engaged  Modes of Intervention:  Activity  Additional Comments:    Doug Sou 08/13/2022, 10:44 AM

## 2022-08-13 NOTE — Plan of Care (Signed)
Calm and cooperative. Visible in dayroom. Interacting with peers and staff. States he's feeling depressed says he's concerned about going back home alone. Support and encouragement provided. Po med compliant. No PRNs given. Denies SI/HI/AV/H. No c/o pain/discomfort noted.   Problem: Activity: Goal: Risk for activity intolerance will decrease Outcome: Progressing   Problem: Nutrition: Goal: Adequate nutrition will be maintained Outcome: Progressing   Problem: Coping: Goal: Level of anxiety will decrease Outcome: Progressing   Problem: Pain Managment: Goal: General experience of comfort will improve Outcome: Progressing   Problem: Safety: Goal: Ability to remain free from injury will improve Outcome: Progressing
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
## 2022-08-13 NOTE — Progress Notes (Signed)
Patient is alert and oriented. He denies SI/HI/AVH. ECT conducted today. Post ECT completed on flowsheet.   Patient continues to be on strict I&O's. Dialysis scheduled for tomorrow morning 05.25.  Q15 minute unit checks in place.

## 2022-08-13 NOTE — Transfer of Care (Signed)
Immediate Anesthesia Transfer of Care Note  Patient: Luis Miller.  Procedure(s) Performed: ECT TX  Patient Location: PACU  Anesthesia Type:General  Level of Consciousness: drowsy  Airway & Oxygen Therapy: Patient Spontanous Breathing and Patient connected to face mask oxygen  Post-op Assessment: Report given to RN, Post -op Vital signs reviewed and stable, and Patient moving all extremities  Post vital signs: Reviewed and stable  Last Vitals:  Vitals Value Taken Time  BP 174/90 08/13/22 1312  Temp    Pulse 67 08/13/22 1315  Resp 14 08/13/22 1315  SpO2 88 % 08/13/22 1315  Vitals shown include unvalidated device data.  Last Pain:  Vitals:   08/13/22 1209  TempSrc:   PainSc: 0-No pain         Complications: No notable events documented.
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
## 2022-08-14 DIAGNOSIS — F319 Bipolar disorder, unspecified: Secondary | ICD-10-CM | POA: Diagnosis not present

## 2022-08-14 LAB — PSA, TOTAL AND FREE
PSA, Free Pct: 23.6 %
PSA, Free: 2.88 ng/mL
Prostate Specific Ag, Serum: 12.2 ng/mL — ABNORMAL HIGH (ref 0.0–4.0)

## 2022-08-14 NOTE — Progress Notes (Signed)
Received patient in bed to unit.    Informed consent signed and in chart.    TX duration:3hr,30 min     Transported By hospital transport back to room Hand-off given to patient's nurse.    Access used: AVF Access issues: None   Total UF removed: 1.9 Medication(s) given: None Post HD VS: Stable tx Post HD weight: 81.3kg    [facility]

## 2022-08-14 NOTE — [facility] Group Notes (Signed)
[facility] Group Notes:  (Nursing/MHT/Case Management/Adjunct)  Date:  08/14/2022  Time:  9:52 AM  Type of Therapy:   meditation therapy  Participation Level:  Active  Participation Quality:  Appropriate  Affect:  Appropriate  Cognitive:  Appropriate  Insight:  Appropriate  Engagement in Group:  Engaged  Modes of Intervention:  Activity  Summary of Progress/Problems:  Luis Miller 08/14/2022, 9:52 AM

## 2022-08-14 NOTE — Progress Notes (Signed)
Patient is alert and oriented. He denies anxiety and depression. Denies SI/HI/AVH. Appetite good. Pain 0/10. Patient received dialysis treatment on their 2nd shift and therefore consumed dinner on the dialysis unit. Patient returned to unit around 1830. He remains on strict I&O's/1200 mL fluid restriction.  No distress noted or voiced.  Q15 minute unit checks in place.

## 2022-08-14 NOTE — Plan of Care (Signed)
AAOX3.  Calm and cooperative. States he's feeling less depressed but anxious about going home alone. Support and encouragement provided. Limited interaction with pees and staff. Showered. Po med compliant. Q 15 min checks maintained for safety. Denies SI/HI/AV/H. No c/o pain/discomfort noted.   Problem: Activity: Goal: Risk for activity intolerance will decrease Outcome: Progressing   Problem: Nutrition: Goal: Adequate nutrition will be maintained Outcome: Progressing   Problem: Coping: Goal: Level of anxiety will decrease Outcome: Progressing   Problem: Elimination: Goal: Will not experience complications related to bowel motility Outcome: Progressing   Problem: Elimination: Goal: Will not experience complications related to urinary retention Outcome: Progressing   Problem: Pain Managment: Goal: General experience of comfort will improve Outcome: Progressing

## 2022-08-14 NOTE — Group Note (Deleted)
[facility] LCSW Group Therapy Note   Group Date: 08/14/2022 Start Time: 1300 End Time: 1400  Type of Therapy and Topic:  Group Therapy:  Feelings around Relapse and Recovery  Participation Level:  {[facility] PARTICIPATION LEVEL:22264}   Mood:  Description of Group:    Patients in this group will discuss emotions they experience before and after a relapse. They will process how experiencing these feelings, or avoidance of experiencing them, relates to having a relapse. Facilitator will guide patients to explore emotions they have related to recovery. Patients will be encouraged to process which emotions are more powerful. They will be guided to discuss the emotional reaction significant others in their lives may have to patients' relapse or recovery. Patients will be assisted in exploring ways to respond to the emotions of others without this contributing to a relapse.  Therapeutic Goals: 1. Patient will identify two or more emotions that lead to relapse for them:  2. Patient will identify two emotions that result when they relapse:  3. Patient will identify two emotions related to recovery:  4. Patient will demonstrate ability to communicate their needs through discussion and/or role plays.   Summary of Patient Progress:  ***   Therapeutic Modalities:   Cognitive Behavioral Therapy Solution-Focused Therapy Assertiveness Training Relapse Prevention Therapy   Suzanne Kho W Danijah Noh, LCSWA 

## 2022-08-15 DIAGNOSIS — F319 Bipolar disorder, unspecified: Secondary | ICD-10-CM | POA: Diagnosis not present

## 2022-08-15 MED ORDER — POLYETHYLENE GLYCOL 3350 17 G PO PACK
17.0000 g | PACK | Freq: Two times a day (BID) | ORAL | Status: DC | PRN
  Administered 2022-08-22 – 2022-08-23 (×2): 17 g via ORAL
  Filled 2022-08-15 (×3): qty 1

## 2022-08-15 NOTE — Plan of Care (Signed)
AAOX3. Calm and cooperative. States he's feeling the same. C/o constipation. Miralex 17g given as ordered PRN for constipation. Tol well. No behavior issues noted. Denies SI/HI/AV/H.  Q 15 min checks maintained for safety. No c/o pain/discomfort noted. Plan of care continued.   Problem: Activity: Goal: Risk for activity intolerance will decrease Outcome: Progressing   Problem: Nutrition: Goal: Adequate nutrition will be maintained Outcome: Progressing   Problem: Coping: Goal: Level of anxiety will decrease Outcome: Progressing   Problem: Elimination: Goal: Will not experience complications related to bowel motility Outcome: Progressing Goal: Will not experience complications related to urinary retention Outcome: Progressing   Problem: Pain Managment: Goal: General experience of comfort will improve Outcome: Progressing

## 2022-08-15 NOTE — Plan of Care (Signed)
Pt denies SI / HI / AVH. Pt is sad depressed. No signs of distress or injury. PT is on I/Os. Output at times of note has been 200 ml. Pt is adherent with scheduled medications. Pt is in milieu watching movies with peers. Staff will continue to monitor.    Problem: Education: Goal: Knowledge of General Education information will improve Description: Including pain rating scale, medication(s)/side effects and non-pharmacologic comfort measures Outcome: Progressing   Problem: Health Behavior/Discharge Planning: Goal: Ability to manage health-related needs will improve Outcome: Progressing   Problem: Clinical Measurements: Goal: Ability to maintain clinical measurements within normal limits will improve Outcome: Progressing
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
## 2022-08-16 DIAGNOSIS — F319 Bipolar disorder, unspecified: Secondary | ICD-10-CM | POA: Diagnosis not present

## 2022-08-16 MED ORDER — ARIPIPRAZOLE 5 MG PO TABS
10.0000 mg | ORAL_TABLET | Freq: Every day | ORAL | Status: DC
  Administered 2022-08-17: 10 mg via ORAL
  Filled 2022-08-16: qty 2

## 2022-08-16 NOTE — Plan of Care (Signed)
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
## 2022-08-16 NOTE — Progress Notes (Addendum)
   08/15/22 2200  Psych Admission Type (Psych Patients Only)  Admission Status Voluntary  Psychosocial Assessment  Patient Complaints Depression  Eye Contact Fair  Facial Expression Flat  Affect Flat  Speech Logical/coherent  Interaction Assertive  Motor Activity Slow  Appearance/Hygiene Unremarkable  Behavior Characteristics Cooperative  Mood Sad;Depressed  Thought Process  Coherency WDL  Content WDL  Delusions None reported or observed  Perception WDL  Hallucination None reported or observed  Judgment Impaired  Confusion None  Danger to Self  Current suicidal ideation? Denies  Danger to Others  Danger to Others None reported or observed   Patient alert and oriented. Presenting with a flat affect and sad, depressed mood. Patient denies SI, HI, AVH, and pain. Patient endorses depression and states, "I still feel like I do when I came in." Support and encouragement provided. Scheduled medications administered to patient, per provider orders. Output recorded 250 mL at 2200. Routine safety checks conducted every 15 minutes.  Patient verbally contracts for safety and remains safe on the unit.

## 2022-08-16 NOTE — Group Note (Signed)
[facility] LCSW Group Therapy Note    Group Date: 08/16/2022 Start Time: 1315 End Time: 1400  Type of Therapy and Topic:  Group Therapy:  Overcoming Obstacles  Participation Level:  [facility] PARTICIPATION LEVEL: Minimal  Mood:  Description of Group:   In this group patients will be encouraged to explore what they see as obstacles to their own wellness and recovery. They will be guided to discuss their thoughts, feelings, and behaviors related to these obstacles. The group will process together ways to cope with barriers, with attention given to specific choices patients can make. Each patient will be challenged to identify changes they are motivated to make in order to overcome their obstacles. This group will be process-oriented, with patients participating in exploration of their own experiences as well as giving and receiving support and challenge from other group members.  Therapeutic Goals: 1. Patient will identify personal and current obstacles as they relate to admission. 2. Patient will identify barriers that currently interfere with their wellness or overcoming obstacles.  3. Patient will identify feelings, thought process and behaviors related to these barriers. 4. Patient will identify two changes they are willing to make to overcome these obstacles:    Summary of Patient Progress Patient was present in group.  Patient participated and was supportive of others.  Patient reports that he has been struggling with overcoming his depression.  Patient reports that he has been trying to go to groups and attend ECT, however, it doesn't seem to be working for him.  CSW engaged patient in discussion on increasing positivity and affirmations to increase mood and improve mindset.  Patient expressed understanding.    Therapeutic Modalities:   Cognitive Behavioral Therapy Solution Focused Therapy Motivational Interviewing Relapse Prevention  Therapy   Harden Mo, LCSW

## 2022-08-16 NOTE — [facility] Suicide Risk Assessment (Signed)
[facility] INPATIENT:  Family/Significant Other Suicide Prevention Education  Suicide Prevention Education:  Education Completed; Adedayo Stoessel, son, [phone] has been identified by the patient as the family member/significant other with whom the patient will be residing, and identified as the person(s) who will aid the patient in the event of a mental health crisis (suicidal ideations/suicide attempt).  With written consent from the patient, the family member/significant other has been provided the following suicide prevention education, prior to the and/or following the discharge of the patient.  The suicide prevention education provided includes the following: Suicide risk factors Suicide prevention and interventions National Suicide Hotline telephone number [facility] assessment telephone number [facility] Emergency Assistance 911 [facility] and/or Residential Mobile Crisis Unit telephone number  Request made of family/significant other to: Remove weapons (e.g., guns, rifles, knives), all items previously/currently identified as safety concern.   Remove drugs/medications (over-the-counter, prescriptions, illicit drugs), all items previously/currently identified as a safety concern.  The family member/significant other verbalizes understanding of the suicide prevention education information provided.  The family member/significant other agrees to remove the items of safety concern listed above.  Harden Mo 08/16/2022, 2:53 PM

## 2022-08-16 NOTE — Progress Notes (Signed)
   08/16/22 0714  Psych Admission Type (Psych Patients Only)  Admission Status Voluntary  Psychosocial Assessment  Patient Complaints Anhedonia;Depression  Eye Contact Brief  Facial Expression Flat;Sullen  Affect Sullen;Flat;Depressed  Speech Logical/coherent  Interaction Assertive  Motor Activity Slow  Appearance/Hygiene Unremarkable  Behavior Characteristics Cooperative  Mood Sullen;Sad  Thought Process  Coherency WDL  Content WDL  Delusions None reported or observed  Perception WDL  Hallucination None reported or observed  Judgment Impaired  Confusion None  Danger to Self  Current suicidal ideation? Denies  Danger to Others  Danger to Others None reported or observed

## 2022-08-16 NOTE — Group Note (Signed)
Date:  08/16/2022 Time:  11:13 PM  Group Topic/Focus:  Building Self Esteem:   The Focus of this group is helping patients become aware of the effects of self-esteem on their lives, the things they and others do that enhance or undermine their self-esteem, seeing the relationship between their level of self-esteem and the choices they make and learning ways to enhance self-esteem.    Participation Level:  Active  Participation Quality:  Appropriate  Affect:  Appropriate  Cognitive:  Alert  Insight: Good  Engagement in Group:  Engaged  Modes of Intervention:  Discussion  Additional Comments:    Burt Ek 08/16/2022, 11:13 PM

## 2022-08-16 NOTE — Plan of Care (Signed)

## 2022-08-16 NOTE — Progress Notes (Signed)
D: Patient alert and oriented times 4. Pt denies SI, HI, AVH. He denies being in pain. Pt endorses anxiety and depression stating "Im not getting any better".    A: Pt provided support and encouragement throughout the day. Scheduled medications given as prescribed. Takes medications whole without issue.    R: Pt remains safe on the unit with Q15 min safety checks in place. Will continue to monitor for changes.

## 2022-08-16 NOTE — [facility] Group Notes (Signed)
[facility] Group Notes:  (Nursing/MHT/Case Management/Adjunct)  Date:  08/16/2022  Time:  10:32 AM  Type of Therapy:  Movement Therapy  Participation Level:  Active  Participation Quality:  Appropriate  Affect:  Appropriate  Cognitive:  Appropriate  Insight:  Appropriate  Engagement in Group:  Engaged  Modes of Intervention:  Activity  Summary of Progress/Problems:  Luis Miller 08/16/2022, 10:32 AM

## 2022-08-17 DIAGNOSIS — N186 End stage renal disease: Secondary | ICD-10-CM | POA: Diagnosis not present

## 2022-08-17 DIAGNOSIS — N2581 Secondary hyperparathyroidism of renal origin: Secondary | ICD-10-CM | POA: Diagnosis not present

## 2022-08-17 DIAGNOSIS — D631 Anemia in chronic kidney disease: Secondary | ICD-10-CM | POA: Diagnosis not present

## 2022-08-17 DIAGNOSIS — F319 Bipolar disorder, unspecified: Secondary | ICD-10-CM | POA: Diagnosis not present

## 2022-08-17 LAB — RENAL FUNCTION PANEL
Albumin: 4 g/dL (ref 3.5–5.0)
Anion gap: 14 (ref 5–15)
BUN: 86 mg/dL — ABNORMAL HIGH (ref 8–23)
CO2: 21 mmol/L — ABNORMAL LOW (ref 22–32)
Calcium: 9.5 mg/dL (ref 8.9–10.3)
Chloride: 99 mmol/L (ref 98–111)
Creatinine, Ser: 6.61 mg/dL — ABNORMAL HIGH (ref 0.61–1.24)
GFR, Estimated: 8 mL/min — ABNORMAL LOW (ref >60)
Glucose, Bld: 92 mg/dL (ref 70–99)
Phosphorus: 5.2 mg/dL — ABNORMAL HIGH (ref 2.5–4.6)
Potassium: 3.6 mmol/L (ref 3.5–5.1)
Sodium: 134 mmol/L — ABNORMAL LOW (ref 135–145)

## 2022-08-17 LAB — CBC
HCT: 27.6 % — ABNORMAL LOW (ref 39.0–52.0)
Hemoglobin: 10 g/dL — ABNORMAL LOW (ref 13.0–17.0)
MCH: 34.7 pg — ABNORMAL HIGH (ref 26.0–34.0)
MCHC: 36.2 g/dL — ABNORMAL HIGH (ref 30.0–36.0)
MCV: 95.8 fL (ref 80.0–100.0)
Platelets: 127 10*3/uL — ABNORMAL LOW (ref 150–400)
RBC: 2.88 MIL/uL — ABNORMAL LOW (ref 4.22–5.81)
RDW: 12.2 % (ref 11.5–15.5)
WBC: 4.2 10*3/uL (ref 4.0–10.5)
nRBC: 0 % (ref 0.0–0.2)

## 2022-08-17 NOTE — Progress Notes (Addendum)
Patient up and ready for dialysis on start of shift. No complaints voiced. Sent to dialysis at this time. 12:30-Arrived from dialysis in stable condition. V/S/S. Eating lunch at this time. Thrill and bruitt noted to left arm on auscultation. No bleeding noted. Pressure bandage noted. Denies any dizziness or lethargy.  Will continue to monitor. 1600-Patient attends groups as scheduled. Tolerated meals and medications. Denies SI/HI/AVH.

## 2022-08-17 NOTE — Group Note (Signed)
Date:  08/17/2022 Time:  10:58 AM  Group Topic/Focus:  Mindfulness guided imagery Patients listened to mindfulness meditation music while sitting amongst themselves self reflecting in slience    Participation Level:  Did Not Attend  Participation Quality:    Affect:    Cognitive:    Insight:   Engagement in Group:    Modes of Intervention:    Additional Comments:  Did not attend  Deeric Cruise T Noe Gens 08/17/2022, 10:58 AM

## 2022-08-17 NOTE — Progress Notes (Signed)
PT BP dropped pressure retaken but still in the 80's. UF turned off. RN made aware. Pt states he feels fine.

## 2022-08-17 NOTE — Group Note (Signed)
LCSW Group Therapy Note   Group Date: 08/17/2022 Start Time: 1300 End Time: 1345   Type of Therapy and Topic:  Group Therapy: Worry and Anxiety/Depression  Participation Level:  Active  Description of Group: In this group, patients will be encouraged to explore their worry around what could happen vs what will happen. Each patient will be challenged to think of personal worries and how they will work their way through that worry around what will happen and what could happen. This group will be process-oriented, with patients participating in exploration of their own experiences as well as giving and receiving support and challenge from other group members.  Therapeutic Goals: Patient will identify personal worries that cause anxiety. Patient will identify clues to identify their worry. Patient will identify ways to handle their worry. Patient will discuss ways that their worry has deceased or why it has not decreased.   Summary of Patient Progress: Patient was present in group. Patient was active and supportive of other group members.  Patient was engaged and participated in discussion. Patient reports that he feels that he is improving; different than the patient's previous self reports that he does not feel that he is doing well. Patient showed fair insight and was able to evidence how he could use the skills discussed.    Therapeutic Modalities: Cognitive Behavioral Therapy Solution Focused Therapy Motivational Interviewing    Harden Mo, [place] 08/17/2022  1:47 PM

## 2022-08-17 NOTE — Progress Notes (Signed)
Per LPN 161 normal saline bolus given. Due to low bp

## 2022-08-17 NOTE — Group Note (Unsigned)
Date:  08/17/2022 Time:  11:12 PM  Group Topic/Focus:  Stages of Change:   The focus of this group is to explain the stages of change and help patients identify changes they want to make upon discharge.     Participation Level:  {BHH PARTICIPATION LEVEL:22264}  Participation Quality:  {BHH PARTICIPATION QUALITY:22265}  Affect:  {BHH AFFECT:22266}  Cognitive:  {BHH COGNITIVE:22267}  Insight: {BHH Insight2:20797}  Engagement in Group:  {BHH ENGAGEMENT IN GROUP:22268}  Modes of Intervention:  {BHH MODES OF INTERVENTION:22269}  Additional Comments:  ***  Wakhaila H Dametra Whetsel 08/17/2022, 11:12 PM  

## 2022-08-17 NOTE — Progress Notes (Signed)
  Received patient in bed to unit.   Informed consent signed and in chart.    TX duration:3.5kg     Transported back to floor  Hand-off given to patient's nurse. No c/o and no distress noted    Access used: LAVG Access issues: none   Total UF removed: 0.9L Medication(s) given: none Post HD VS: 125/73 Post HD weight: 81.0kg     Lynann Beaver  [facility]
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
## 2022-08-17 NOTE — Plan of Care (Signed)
AAOX3. Calm and cooperative. Flat affect. States he's feeling the same. Endorses depression and sadness. Support and encouragement provided. Attends group. Interacting with peers and staff. Pt shaved with supv. Po meds given as ordered. No PRNs given. No behavior issues noted. Denies SI/HI/AV/H. Q 15 min checks maintained for safety. No c/o pain/discomfort noted. Plan of care continued.   Problem: Activity: Goal: Risk for activity intolerance will decrease Outcome: Progressing   Problem: Nutrition: Goal: Adequate nutrition will be maintained Outcome: Progressing   Problem: Coping: Goal: Level of anxiety will decrease Outcome: Progressing   Problem: Elimination: Goal: Will not experience complications related to bowel motility Outcome: Progressing Goal: Will not experience complications related to urinary retention Outcome: Progressing   Problem: Pain Managment: Goal: General experience of comfort will improve Outcome: Progressing

## 2022-08-17 NOTE — Group Note (Signed)
Recreation Therapy Group Note   Group Topic:Coping Skills  Group Date: 08/17/2022 Start Time: 1400 End Time: 1440 Facilitators: Rosina Lowenstein, LRT, CTRS Location:  Dayroom  Group Description: Chair Yoga. LRT and patients discussed the benefits of yoga and how it differs from strength exercises. LRT educated patients on the mental and physical benefits of yoga and deep breathing and how it can be used as a Associate Professor. LRT and patients followed along to a guided yoga session on the television that focused on all parts of the body, as well as deep breathing. Pt encouraged to stop movement at any time if they feel discomfort or pain.   Goal Area(s) Addressed: Patient will practice using relaxation technique. Patient will identify a new coping skill.  Patient will follow multistep directions to reduce anxiety and stress.  Affect/Mood: Appropriate   Participation Level: Active and Engaged   Participation Quality: Independent   Behavior: Calm and Cooperative   Speech/Thought Process: Coherent   Insight: Good   Judgement: Good   Modes of Intervention: Activity   Patient Response to Interventions:  Attentive, Engaged, Interested , and Receptive   Education Outcome:  Acknowledges education   Clinical Observations/Individualized Feedback: Luis Miller was active in their participation of session activities and group discussion. Pt completed all yoga exercises appropriately. Pt interacted well with LRT and peers duration of group.   Plan: Continue to engage patient in RT group sessions 2-3x/week.   Rosina Lowenstein, LRT, CTRS 08/17/2022 3:08 PM

## 2022-08-17 NOTE — Progress Notes (Signed)
UF goal dec to 1.0Kg per NP.  Pt with no c/o and is asymptomatic

## 2022-08-17 NOTE — Progress Notes (Signed)
[facility]  ROUNDING NOTE   Subjective:   Patient seen and evaluated during dialysis   HEMODIALYSIS FLOWSHEET:  Blood Flow Rate (mL/min): 400 mL/min Arterial Pressure (mmHg): -210 mmHg Venous Pressure (mmHg): 230 mmHg TMP (mmHg): 12 mmHg Ultrafiltration Rate (mL/min): 777 mL/min Dialysate Flow Rate (mL/min): 300 ml/min Dialysis Fluid Bolus: Normal Saline Bolus Amount (mL): 100 mL  Patient seen sitting up in chair Partial smiling breathing  HD RN reports fluctuating blood pressure, UF reduced  Objective:  Vital signs in last 24 hours:  Temp:  [97.8 F (36.6 C)-98.5 F (36.9 C)] 98.1 F (36.7 C) (05/28 0751) Pulse Rate:  [66-89] 66 (05/28 1100) Resp:  [12-24] 15 (05/28 1100) BP: (87-155)/(56-89) 110/62 (05/28 1100) SpO2:  [92 %-100 %] 100 % (05/28 1100) Weight:  [81.6 kg] 81.6 kg (05/28 0801)  Weight change:  Filed Weights   08/14/22 1431 08/14/22 1914 08/17/22 0801  Weight: 83.1 kg 81.3 kg 81.6 kg    Intake/Output: I/O last 3 completed shifts: In: 30 [P.O.:30] Out: 750 [Urine:750]   Intake/Output this shift:  No intake/output data recorded.  Physical Exam: General: NAD, seated in chair  Head: Normocephalic, atraumatic. Moist oral mucosal membranes  Eyes: Anicteric  Lungs:  Clear to auscultation  Heart: Regular rate and rhythm  Abdomen:  Soft, nontender  Extremities:  no peripheral edema.  Neurologic: Nonfocal, moving all four extremities  Skin: No lesions  Access: Left AVG    Basic Metabolic Panel: Recent Labs  Lab 08/11/22 1034 08/12/22 0816 08/17/22 0804  NA 130* 130* 134*  K 4.5 3.9 3.6  CL 92* 94* 99  CO2 26 23 21*  GLUCOSE 101* 95 92  BUN 60* 73* 86*  CREATININE 5.37* 6.28* 6.61*  CALCIUM 9.3 9.1 9.5  PHOS  --  5.8* 5.2*     Liver Function Tests: Recent Labs  Lab 08/12/22 0816 08/17/22 0804  ALBUMIN 3.9 4.0    No results for input(s): "LIPASE", "AMYLASE" in the last 168 hours. No results for input(s):  "AMMONIA" in the last 168 hours.  CBC: Recent Labs  Lab 08/12/22 0816 08/17/22 0804  WBC 5.5 4.2  HGB 9.9* 10.0*  HCT 27.3* 27.6*  MCV 96.5 95.8  PLT 115* 127*     Cardiac Enzymes: No results for input(s): "CKTOTAL", "CKMB", "CKMBINDEX", "TROPONINI" in the last 168 hours.  BNP: Invalid input(s): "POCBNP"  CBG: Recent Labs  Lab 08/11/22 1210 08/13/22 1338  GLUCAP 92 92     Microbiology: Results for orders placed or performed in visit on 05/12/22  Microscopic Examination     Status: Abnormal   Collection Time: 05/12/22 10:39 AM   Urine  Result Value Ref Range Status   WBC, UA 6-10 (A) 0 - 5 /hpf Final   RBC, Urine 0-2 0 - 2 /hpf Final   Epithelial Cells (non renal) 0-10 0 - 10 /hpf Final   Bacteria, UA Moderate (A) None seen/Few Final    Coagulation Studies: No results for input(s): "LABPROT", "INR" in the last 72 hours.  Urinalysis: No results for input(s): "COLORURINE", "LABSPEC", "PHURINE", "GLUCOSEU", "HGBUR", "BILIRUBINUR", "KETONESUR", "PROTEINUR", "UROBILINOGEN", "NITRITE", "LEUKOCYTESUR" in the last 72 hours.  Invalid input(s): "APPERANCEUR"    Imaging: No results found.   Medications:     allopurinol  100 mg Oral BID   ARIPiprazole  10 mg Oral QPC breakfast   benazepril  10 mg Oral QPM   buPROPion  150 mg Oral Daily   cholecalciferol  1,000 Units Oral Daily  doxepin  50 mg Oral QHS   feeding supplement (NEPRO CARB STEADY)  237 mL Oral Q24H   loratadine  10 mg Oral Daily   mirtazapine  15 mg Oral QHS   potassium chloride  10 mEq Oral Daily   rosuvastatin  10 mg Oral Daily   acetaminophen, alteplase, alum & mag hydroxide-simeth, fentaNYL (SUBLIMAZE) injection, fluticasone, heparin, lidocaine (PF), lidocaine-prilocaine, ondansetron (ZOFRAN) IV, pentafluoroprop-tetrafluoroeth, polyethylene glycol, traZODone  Assessment/ Plan:  [name]. is a 77 y.o. white male with end stage renal disease on hemodialysis, hypertension, gout,  depression, history of melanoma, crohn's, and CVA who presents to [facility] for Bipolar depression (HCC) [F31.9]  [facility] [address] Left AVG 87kg  End Stage Renal Disease on hemodialysis: Receiving treatment today, UF goal 2L, reduced to 1L. Next treatment scheduled for Wednesday.  Anemia with chronic kidney disease: Hgb 10.0. No need for ESA.  Hypertension with chronic kidney disease: Hypotensive on treatment today. Current regimen of amlodipine, benazepril. Patient did not get his medications this morning prior to dialysis. - Continue holding hydralazine - Amlodipine now held - Blood pressure soft   Secondary Hyperparathyroidism: outpatient PTH of 456. Calcium at goal. Phosphorus slowly improving    LOS: 18   5/28/202411:12 AM

## 2022-08-18 ENCOUNTER — Ambulatory Visit: Payer: Medicare Other

## 2022-08-18 ENCOUNTER — Encounter: Payer: Self-pay | Admitting: Psychiatry

## 2022-08-18 ENCOUNTER — Inpatient Hospital Stay: Payer: Medicare Other | Admitting: Anesthesiology

## 2022-08-18 DIAGNOSIS — Z87891 Personal history of nicotine dependence: Secondary | ICD-10-CM | POA: Diagnosis not present

## 2022-08-18 DIAGNOSIS — I10 Essential (primary) hypertension: Secondary | ICD-10-CM | POA: Diagnosis not present

## 2022-08-18 DIAGNOSIS — F319 Bipolar disorder, unspecified: Secondary | ICD-10-CM | POA: Diagnosis not present

## 2022-08-18 LAB — CBC
HCT: 28.9 % — ABNORMAL LOW (ref 39.0–52.0)
Hemoglobin: 10.4 g/dL — ABNORMAL LOW (ref 13.0–17.0)
MCH: 34.8 pg — ABNORMAL HIGH (ref 26.0–34.0)
MCHC: 36 g/dL (ref 30.0–36.0)
MCV: 96.7 fL (ref 80.0–100.0)
Platelets: 131 10*3/uL — ABNORMAL LOW (ref 150–400)
RBC: 2.99 MIL/uL — ABNORMAL LOW (ref 4.22–5.81)
RDW: 12.3 % (ref 11.5–15.5)
WBC: 4.5 10*3/uL (ref 4.0–10.5)
nRBC: 0 % (ref 0.0–0.2)

## 2022-08-18 LAB — RENAL FUNCTION PANEL
Albumin: 4.3 g/dL (ref 3.5–5.0)
Anion gap: 9 (ref 5–15)
BUN: 60 mg/dL — ABNORMAL HIGH (ref 8–23)
CO2: 28 mmol/L (ref 22–32)
Calcium: 9.2 mg/dL (ref 8.9–10.3)
Chloride: 95 mmol/L — ABNORMAL LOW (ref 98–111)
Creatinine, Ser: 5.3 mg/dL — ABNORMAL HIGH (ref 0.61–1.24)
GFR, Estimated: 10 mL/min — ABNORMAL LOW (ref >60)
Glucose, Bld: 155 mg/dL — ABNORMAL HIGH (ref 70–99)
Phosphorus: 3.8 mg/dL (ref 2.5–4.6)
Potassium: 4 mmol/L (ref 3.5–5.1)
Sodium: 132 mmol/L — ABNORMAL LOW (ref 135–145)

## 2022-08-18 MED ORDER — DROPERIDOL 2.5 MG/ML IJ SOLN: 0.6250 mg | Freq: Once | INTRAMUSCULAR | Status: DC | PRN

## 2022-08-18 MED ORDER — OXYCODONE HCL 5 MG PO TABS: 5.0000 mg | ORAL_TABLET | Freq: Once | ORAL | Status: DC | PRN

## 2022-08-18 MED ORDER — ACETAMINOPHEN 10 MG/ML IV SOLN: 1000.0000 mg | Freq: Once | INTRAVENOUS | Status: AC | PRN

## 2022-08-18 MED ORDER — ARIPIPRAZOLE 5 MG PO TABS
15.0000 mg | ORAL_TABLET | Freq: Every day | ORAL | Status: DC
  Administered 2022-08-19 – 2022-09-12 (×25): 15 mg via ORAL
  Filled 2022-08-18 (×26): qty 3

## 2022-08-18 MED ORDER — KETAMINE HCL 10 MG/ML IJ SOLN
INTRAMUSCULAR | Status: DC | PRN
  Administered 2022-08-18: 80 mg via INTRAVENOUS

## 2022-08-18 MED ORDER — SUCCINYLCHOLINE CHLORIDE 200 MG/10ML IV SOSY
PREFILLED_SYRINGE | INTRAVENOUS | Status: DC | PRN
  Administered 2022-08-18: 100 mg via INTRAVENOUS

## 2022-08-18 MED ORDER — PROMETHAZINE HCL 25 MG/ML IJ SOLN: 6.2500 mg | INTRAMUSCULAR | Status: DC | PRN

## 2022-08-18 MED ORDER — OXYCODONE HCL 5 MG/5ML PO SOLN
5.0000 mg | Freq: Once | ORAL | Status: DC | PRN
  Filled 2022-08-18: qty 5

## 2022-08-18 MED ORDER — DOXEPIN HCL 50 MG PO CAPS
75.0000 mg | ORAL_CAPSULE | Freq: Every day | ORAL | Status: DC
  Administered 2022-08-18 – 2022-08-19 (×2): 75 mg via ORAL
  Filled 2022-08-18 (×2): qty 1

## 2022-08-18 MED ORDER — SODIUM CHLORIDE 0.9 % IV SOLN: INTRAVENOUS | Status: DC | PRN

## 2022-08-18 MED ORDER — FENTANYL CITRATE PF 50 MCG/ML IJ SOSY: 25.0000 ug | PREFILLED_SYRINGE | INTRAMUSCULAR | Status: DC | PRN
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
## 2022-08-18 NOTE — Progress Notes (Signed)
   08/18/22 1500  Psych Admission Type (Psych Patients Only)  Admission Status Voluntary  Psychosocial Assessment  Patient Complaints Depression;Hopelessness  Eye Contact Brief  Facial Expression Flat;Sullen  Affect Sullen;Flat  Speech Logical/coherent  Interaction Assertive  Motor Activity Slow;Unsteady  Appearance/Hygiene In scrubs  Behavior Characteristics Cooperative  Mood Sullen  Thought Process  Coherency WDL  Content WDL  Delusions None reported or observed  Perception WDL  Hallucination None reported or observed  Judgment Impaired  Confusion None  Danger to Self  Current suicidal ideation? Denies  Danger to Others  Danger to Others None reported or observed

## 2022-08-18 NOTE — Group Note (Signed)
LCSW Group Therapy Note  Group Date: 08/18/2022 Start Time: 1300 End Time: 1400   Type of Therapy and Topic:  Group Therapy - How To Cope with Nervousness about Discharge   Participation Level:  Active   Description of Group This process group involved identification of patients' feelings about discharge. Some of them are scheduled to be discharged soon, while others are new admissions, but each of them was asked to share thoughts and feelings surrounding discharge from the hospital. One common theme was that they are excited at the prospect of going home, while another was that many of them are apprehensive about sharing why they were hospitalized. Patients were given the opportunity to discuss these feelings with their peers in preparation for discharge.  Therapeutic Goals  Patient will identify their overall feelings about pending discharge. Patient will think about how they might proactively address issues that they believe will once again arise once they get home (i.e. with parents). Patients will participate in discussion about having hope for change.   Summary of Patient Progress:   Patient was present for the entirety of the group session. Patient was an active listener and participated in the topic of discussion, provided helpful advice to others, and added nuance to topic of conversation. Patient was primarily concerned about cost of ECT treatment and hospitalization. CSW discussed reaching out to financial services to answer any of his questions.    Therapeutic Modalities Cognitive Behavioral Therapy   Luis Miller 08/18/2022  3:35 PM

## 2022-08-18 NOTE — Group Note (Signed)
Date:  08/18/2022 Time:  10:51 AM  Group Topic/Focus:  Personal Choices and Values:   The focus of this group is to help patients assess and explore the importance of values in their lives, how their values affect their decisions, how they express their values and what opposes their expression. Music group selection.   Participation Level:  Did Not Attend   Doug Sou 08/18/2022, 10:51 AM
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
## 2022-08-18 NOTE — Procedures (Signed)
ECT SERVICES Physician's Interval Evaluation & Treatment Note  Patient Identification: [name]. MRN:  [id number] Date of Evaluation:  08/18/2022 TX #: 3  MADRS:   MMSE:   P.E. Findings:  No change to physical exam  Psychiatric Interval Note:  Dysphoric and blunted  Subjective:  Patient is a 77 y.o. male seen for evaluation for Electroconvulsive Therapy. Slightly improved  Treatment Summary:   [x]   Right Unilateral             []  Bilateral   % Energy : 0.3 ms 100%   Impedance: 1790 ohms  Seizure Energy Index: 3337 V squared  Postictal Suppression Index: 69%  Seizure Concordance Index: 97%  Medications  Pre Shock: Ketamine 80 mg succinylcholine 100 mg  Post Shock:    Seizure Duration: 27 seconds EMG 67 seconds EEG   Comments: Follow-up next treatment Friday  Lungs:  [x]   Clear to auscultation               []  Other:   Heart:    [x]   Regular rhythm             []  irregular rhythm    [x]   Previous H&P reviewed, patient examined and there are NO CHANGES                 []   Previous H&P reviewed, patient examined and there are changes noted.   Mordecai Rasmussen, MD 5/29/20244:44 PM

## 2022-08-18 NOTE — BH IP Treatment Plan (Addendum)
Interdisciplinary Treatment and Diagnostic Plan Update  08/18/2022 Time of Session: 0830 Luis Miller. MRN: [id number]  Principal Diagnosis: Bipolar depression (HCC)  Secondary Diagnoses: Principal Problem:   Bipolar depression (HCC)   Current Medications:  Current Facility-Administered Medications  Medication Dose Route Frequency Provider Last Rate Last Admin   acetaminophen (TYLENOL) tablet 650 mg  650 mg Oral Q6H PRN Clapacs, Jackquline Denmark, MD   650 mg at 08/11/22 1352   allopurinol (ZYLOPRIM) tablet 100 mg  100 mg Oral BID Clapacs, [name], MD   100 mg at 08/17/22 2111   alteplase (CATHFLO ACTIVASE) injection 2 mg  2 mg Intracatheter Once PRN Cherylann Ratel, Munsoor, MD       alum & mag hydroxide-simeth (MAALOX/MYLANTA) 200-200-20 MG/5ML suspension 30 mL  30 mL Oral Q4H PRN Clapacs, Jackquline Denmark, MD       ARIPiprazole (ABILIFY) tablet 10 mg  10 mg Oral QPC breakfast Reggie Pile, MD   10 mg at 08/17/22 1245   benazepril (LOTENSIN) tablet 10 mg  10 mg Oral QPM Mosetta Pigeon, MD   10 mg at 08/17/22 1631   buPROPion (WELLBUTRIN XL) 24 hr tablet 150 mg  150 mg Oral Daily [name], DO   150 mg at 08/17/22 1245   cholecalciferol (VITAMIN D3) 25 MCG (1000 UNIT) tablet 1,000 Units  1,000 Units Oral Daily Clapacs, [name], MD   1,000 Units at 08/17/22 1245   doxepin (SINEQUAN) capsule 50 mg  50 mg Oral QHS [name], DO   50 mg at 08/17/22 2111   feeding supplement (NEPRO CARB STEADY) liquid 237 mL  237 mL Oral Q24H Mosetta Pigeon, MD   237 mL at 08/17/22 1248   fentaNYL (SUBLIMAZE) injection 25 mcg  25 mcg Intravenous Q5 min PRN Darleene Cleaver, Gijsbertus F, MD       fluticasone (FLONASE) 50 MCG/ACT nasal spray 2 spray  2 spray Each Nare Daily PRN Clapacs, [name], MD       heparin injection 1,000 Units  1,000 Units Dialysis PRN Lateef, Munsoor, MD       lidocaine (PF) (XYLOCAINE) 1 % injection 5 mL  5 mL Intradermal PRN Lateef, Munsoor, MD       lidocaine-prilocaine (EMLA) cream 1  Application  1 Application Topical PRN Lateef, Munsoor, MD   1 Application at 08/12/22 0700   loratadine (CLARITIN) tablet 10 mg  10 mg Oral Daily Clapacs, [name], MD   10 mg at 08/17/22 1245   mirtazapine (REMERON) tablet 15 mg  15 mg Oral QHS [name], DO   15 mg at 08/17/22 2111   ondansetron (ZOFRAN) injection 4 mg  4 mg Intravenous Once PRN Darleene Cleaver, Gijsbertus F, MD       pentafluoroprop-tetrafluoroeth (GEBAUERS) aerosol 1 Application  1 Application Topical PRN Cherylann Ratel, Munsoor, MD       polyethylene glycol (MIRALAX / GLYCOLAX) packet 17 g  17 g Oral BID PRN Reggie Pile, MD       potassium chloride (KLOR-CON M) CR tablet 10 mEq  10 mEq Oral Daily [name], DO   10 mEq at 08/17/22 1245   rosuvastatin (CRESTOR) tablet 10 mg  10 mg Oral Daily Clapacs, [name], MD   10 mg at 08/17/22 1245   traZODone (DESYREL) tablet 50 mg  50 mg Oral QHS PRN [name], DO   50 mg at 08/11/22 2100   PTA Medications: Medications Prior to Admission  Medication Sig Dispense Refill  Last Dose   allopurinol (ZYLOPRIM) 100 MG tablet Take 100 mg by mouth 2 (two) times daily.      amLODipine (NORVASC) 10 MG tablet TAKE 1 TABLET DAILY 90 tablet 1    benazepril (LOTENSIN) 40 MG tablet Take 40 mg by mouth daily.       calcitRIOL (ROCALTROL) 0.25 MCG capsule Take 0.25 mcg by mouth daily. (Patient not taking: Reported on 07/30/2022)      FLUoxetine (PROZAC) 20 MG capsule Take 1 capsule (20 mg total) by mouth daily. 90 capsule 0    fluticasone (FLONASE) 50 MCG/ACT nasal spray Place 2 sprays into both nostrils daily. 16 g 6    hydrALAZINE (APRESOLINE) 100 MG tablet Take 100 mg by mouth 3 (three) times daily.      lamoTRIgine (LAMICTAL) 25 MG tablet TAKE 1 TABLET DAILY 90 tablet 0    loperamide (IMODIUM) 2 MG capsule Take 2 mg by mouth daily as needed for diarrhea or loose stools.      loratadine (CLARITIN) 10 MG tablet TAKE 1 TABLET BY MOUTH EVERY DAY 90 tablet 1    Multiple  Vitamin (MULTIVITAMIN) tablet Take 1 tablet by mouth daily.      OLANZapine (ZYPREXA) 5 MG tablet Take 1 tablet (5 mg total) by mouth at bedtime. 90 tablet 0    rosuvastatin (CRESTOR) 10 MG tablet TAKE 1 TABLET BY MOUTH EVERY DAY 90 tablet 0    Vitamin D, Cholecalciferol, 25 MCG (1000 UT) TABS Take 1,000 tablets by mouth daily.        Patient Stressors: Medication change or noncompliance    Patient Strengths: Ability for insight  Average or above average intelligence  Capable of independent living  Communication skills  Motivation for treatment/growth  Supportive family/friends   Treatment Modalities: Medication Management, Group therapy, Case management,  1 to 1 session with clinician, Psychoeducation, Recreational therapy.   Physician Treatment Plan for Primary Diagnosis: Bipolar depression (HCC) Long Term Goal(s): Improvement in symptoms so as ready for discharge   Short Term Goals: Compliance with prescribed medications will improve Ability to verbalize feelings will improve  Medication Management: Evaluate patient's response, side effects, and tolerance of medication regimen.  Therapeutic Interventions: 1 to 1 sessions, Unit Group sessions and Medication administration.  Evaluation of Outcomes: Progressing  Physician Treatment Plan for Secondary Diagnosis: Principal Problem:   Bipolar depression (HCC)  Long Term Goal(s): Improvement in symptoms so as ready for discharge   Short Term Goals: Compliance with prescribed medications will improve Ability to verbalize feelings will improve     Medication Management: Evaluate patient's response, side effects, and tolerance of medication regimen.  Therapeutic Interventions: 1 to 1 sessions, Unit Group sessions and Medication administration.  Evaluation of Outcomes: Progressing   RN Treatment Plan for Primary Diagnosis: Bipolar depression (HCC) Long Term Goal(s): Knowledge of disease and therapeutic regimen to maintain  health will improve  Short Term Goals: Ability to remain free from injury will improve, Ability to verbalize frustration and anger appropriately will improve, Ability to demonstrate self-control, Ability to participate in decision making will improve, Ability to verbalize feelings will improve, Ability to disclose and discuss suicidal ideas, Ability to identify and develop effective coping behaviors will improve, and Compliance with prescribed medications will improve  Medication Management: RN will administer medications as ordered by provider, will assess and evaluate patient's response and provide education to patient for prescribed medication. RN will report any adverse and/or side effects to prescribing provider.  Therapeutic Interventions: 1 on 1  counseling sessions, Psychoeducation, Medication administration, Evaluate responses to treatment, Monitor vital signs and CBGs as ordered, Perform/monitor CIWA, COWS, AIMS and Fall Risk screenings as ordered, Perform wound care treatments as ordered.  Evaluation of Outcomes: Progressing   LCSW Treatment Plan for Primary Diagnosis: Bipolar depression (HCC) Long Term Goal(s): Safe transition to appropriate next level of care at discharge, Engage patient in therapeutic group addressing interpersonal concerns.  Short Term Goals: Engage patient in aftercare planning with referrals and resources, Increase social support, Increase ability to appropriately verbalize feelings, Increase emotional regulation, Facilitate acceptance of mental health diagnosis and concerns, Facilitate patient progression through stages of change regarding substance use diagnoses and concerns, Identify triggers associated with mental health/substance abuse issues, and Increase skills for wellness and recovery  Therapeutic Interventions: Assess for all discharge needs, 1 to 1 time with Social worker, Explore available resources and support systems, Assess for adequacy in community  support network, Educate family and significant other(s) on suicide prevention, Complete Psychosocial Assessment, Interpersonal group therapy.  Evaluation of Outcomes: Progressing   Progress in Treatment: Attending groups: Yes. Participating in groups: Yes. Taking medication as prescribed: Yes. Toleration medication: Yes. Family/Significant other contact made: Yes, individual(s) contacted:  Jarren Mitchum, son, [phone] Patient understands diagnosis: Yes. Discussing patient identified problems/goals with staff: Yes. Medical problems stabilized or resolved: Yes. Denies suicidal/homicidal ideation: No. Issues/concerns per patient self-inventory: Yes. Other: none  New problem(s) identified: No, Describe:  none  New Short Term/Long Term Goal(s): Patient to work towards medication management for mood stabilization; elimination of SI thoughts; development of comprehensive mental wellness plan.   Patient Goals:  No additional goals identified at this time. Patient to continue to work towards original goals identified in initial treatment team meeting. CSW will remain available to patient should they voice additional treatment goals.   Discharge Plan or Barriers: No psychosocial barriers identified at this time, patient to return to place of residence when appropriate for discharge.   Reason for Continuation of Hospitalization: Depression Medication stabilization  Estimated Length of Stay: 1-7 days   Scribe for Treatment Team: Almedia Balls 08/18/2022 10:26 AM
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
## 2022-08-18 NOTE — Transfer of Care (Signed)
Immediate Anesthesia Transfer of Care Note  Patient: Luis Miller.  Procedure(s) Performed: ECT TX  Patient Location: PACU  Anesthesia Type:General  Level of Consciousness: drowsy and patient cooperative  Airway & Oxygen Therapy: Patient Spontanous Breathing  Post-op Assessment: Report given to RN and Post -op Vital signs reviewed and stable  Post vital signs: Reviewed and stable  Last Vitals:  Vitals Value Taken Time  BP 148/74 08/18/22 1330  Temp    Pulse 75 08/18/22 1329  Resp 18 08/18/22 1330  SpO2 100 % 08/18/22 1330  Vitals shown include unvalidated device data.  Last Pain:  Vitals:   08/17/22 2000  TempSrc:   PainSc: 0-No pain         Complications: No notable events documented.

## 2022-08-18 NOTE — Group Note (Signed)
Recreation Therapy Group Note   Group Topic:Relaxation  Group Date: 08/18/2022 Start Time: 1400 End Time: 1445 Facilitators: Rosina Lowenstein, LRT, CTRS Location:  Dayroom and Courtyard  Group Description: Meditation. LRT asks patients their current level of stress/anxiety from 1-10, with 10 being the highest. LRT educated on the benefits of mindfulness and how it can apply to everyday life post-discharge. LRT and pt's followed along to an audio script of a "guided meditation" video. LRT asked pt their level of stress and anxiety once the prompt was finished. LRT facilitated post-activity processing to gain feedback on session. Patients then had the option to go outside to the courtyard for fresh air and sunlight.   Goal Area(s) Addressed:  Patient will practice using relaxation technique. Patient will identify a new coping skill.  Patient will follow multistep directions to reduce anxiety and stress.  Affect/Mood: N/A   Participation Level: Did not attend    Clinical Observations/Individualized Feedback: Luis Miller did not attend group due to ECT.  Plan: Continue to engage patient in RT group sessions 2-3x/week.   Rosina Lowenstein, LRT, CTRS 08/18/2022 2:51 PM

## 2022-08-18 NOTE — Group Note (Signed)
Date:  08/18/2022 Time:  10:08 PM  Group Topic/Focus:  Building Self Esteem:   The Focus of this group is helping patients become aware of the effects of self-esteem on their lives, the things they and others do that enhance or undermine their self-esteem, seeing the relationship between their level of self-esteem and the choices they make and learning ways to enhance self-esteem. Coping With Mental Health Crisis:   The purpose of this group is to help patients identify strategies for coping with mental health crisis.  Group discusses possible causes of crisis and ways to manage them effectively. Developing a Wellness Toolbox:   The focus of this group is to help patients develop a "wellness toolbox" with skills and strategies to promote recovery upon discharge. Goals Group:   The focus of this group is to help patients establish daily goals to achieve during treatment and discuss how the patient can incorporate goal setting into their daily lives to aide in recovery. Healthy Communication:   The focus of this group is to discuss communication, barriers to communication, as well as healthy ways to communicate with others. Identifying Needs:   The focus of this group is to help patients identify their personal needs that have been historically problematic and identify healthy behaviors to address their needs. Making Healthy Choices:   The focus of this group is to help patients identify negative/unhealthy choices they were using prior to admission and identify positive/healthier coping strategies to replace them upon discharge. Managing Feelings:   The focus of this group is to identify what feelings patients have difficulty handling and develop a plan to handle them in a healthier way upon discharge. Overcoming Stress:   The focus of this group is to define stress and help patients assess their triggers. Personal Choices and Values:   The focus of this group is to help patients assess and explore the  importance of values in their lives, how their values affect their decisions, how they express their values and what opposes their expression. Rediscovering Joy:   The focus of this group is to explore various ways to relieve stress in a positive manner. Self Care:   The focus of this group is to help patients understand the importance of self-care in order to improve or restore emotional, physical, spiritual, interpersonal, and financial health.    Participation Level:  Active  Participation Quality:  Attentive  Affect:  Appropriate  Cognitive:  Alert, Appropriate, and Oriented  Insight: Appropriate, Good, and Improving  Engagement in Group:  Developing/Improving, Engaged, and Improving  Modes of Intervention:  Activity and Support  Additional Comments:    Luis Miller 08/18/2022, 10:08 PM

## 2022-08-18 NOTE — Plan of Care (Signed)
AAOX3. Calm and cooperative. Flat affect. Pt remains depressed and anxious. States he's feeling worse than before. He don't feel ECT is helping and afraid to go home alone with the same thoughts and feelings. Pt said he is uncertain about his continued treatment with ECT if he's not going to get any better. Support and encouragement provided. No voiced thoughts of hurting himself. No AV/H noted. Pt slept at short intervals throughout the night. No c/o pain/discomfort noted. Plan of care continued.   Problem: Activity: Goal: Risk for activity intolerance will decrease Outcome: Progressing   Problem: Nutrition: Goal: Adequate nutrition will be maintained Outcome: Progressing   Problem: Coping: Goal: Level of anxiety will decrease Outcome: Progressing   Problem: Elimination: Goal: Will not experience complications related to bowel motility Outcome: Progressing Goal: Will not experience complications related to urinary retention Outcome: Progressing   Problem: Pain Managment: Goal: General experience of comfort will improve Outcome: Progressing   Problem: Safety: Goal: Ability to remain free from injury will improve Outcome: Progressing   Problem: Skin Integrity: Goal: Risk for impaired skin integrity will decrease Outcome: Progressing

## 2022-08-19 ENCOUNTER — Other Ambulatory Visit: Payer: Self-pay | Admitting: Psychiatry

## 2022-08-19 DIAGNOSIS — N186 End stage renal disease: Secondary | ICD-10-CM | POA: Diagnosis not present

## 2022-08-19 DIAGNOSIS — I120 Hypertensive chronic kidney disease with stage 5 chronic kidney disease or end stage renal disease: Secondary | ICD-10-CM | POA: Diagnosis not present

## 2022-08-19 DIAGNOSIS — D631 Anemia in chronic kidney disease: Secondary | ICD-10-CM | POA: Diagnosis not present

## 2022-08-19 DIAGNOSIS — F319 Bipolar disorder, unspecified: Secondary | ICD-10-CM | POA: Diagnosis not present

## 2022-08-19 LAB — CBC
HCT: 26 % — ABNORMAL LOW (ref 39.0–52.0)
Hemoglobin: 9.4 g/dL — ABNORMAL LOW (ref 13.0–17.0)
MCH: 35.1 pg — ABNORMAL HIGH (ref 26.0–34.0)
MCHC: 36.2 g/dL — ABNORMAL HIGH (ref 30.0–36.0)
MCV: 97 fL (ref 80.0–100.0)
Platelets: 108 10*3/uL — ABNORMAL LOW (ref 150–400)
RBC: 2.68 MIL/uL — ABNORMAL LOW (ref 4.22–5.81)
RDW: 12.3 % (ref 11.5–15.5)
WBC: 3.5 10*3/uL — ABNORMAL LOW (ref 4.0–10.5)
nRBC: 0 % (ref 0.0–0.2)

## 2022-08-19 LAB — RENAL FUNCTION PANEL
Albumin: 3.7 g/dL (ref 3.5–5.0)
Anion gap: 13 (ref 5–15)
BUN: 78 mg/dL — ABNORMAL HIGH (ref 8–23)
CO2: 25 mmol/L (ref 22–32)
Calcium: 9 mg/dL (ref 8.9–10.3)
Chloride: 96 mmol/L — ABNORMAL LOW (ref 98–111)
Creatinine, Ser: 6.22 mg/dL — ABNORMAL HIGH (ref 0.61–1.24)
GFR, Estimated: 9 mL/min — ABNORMAL LOW (ref >60)
Glucose, Bld: 178 mg/dL — ABNORMAL HIGH (ref 70–99)
Phosphorus: 4.1 mg/dL (ref 2.5–4.6)
Potassium: 3.6 mmol/L (ref 3.5–5.1)
Sodium: 134 mmol/L — ABNORMAL LOW (ref 135–145)
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
## 2022-08-19 NOTE — Progress Notes (Signed)
Patient pleasant and cooperative.  Sad affect.  Patient endorses depression and concerned about how he will function when he is discharged home.  Denies SI, HI and AVH.  Patient is present in the milieu.  Appropriate interaction with peers. Compliant with scheduled medications.  15 min checks in place for safety.     Patient left for dialysis at 0845.

## 2022-08-19 NOTE — Plan of Care (Signed)
  Problem: Education: Goal: Knowledge of General Education information will improve Description: Including pain rating scale, medication(s)/side effects and non-pharmacologic comfort measures Outcome: Progressing   Problem: Activity: Goal: Risk for activity intolerance will decrease Outcome: Progressing   Problem: Nutrition: Goal: Adequate nutrition will be maintained Outcome: Progressing   Problem: Coping: Goal: Level of anxiety will decrease Outcome: Progressing   

## 2022-08-19 NOTE — Progress Notes (Signed)
Assumed care of patient after he returned from dialysis this pm, patient Alert and oriented to all spheres, denied having thoughts plan or intent to harm self or others. Luis Miller spent most of the evening in the dayroom, attended groups and was without complaints. Denied pain or discomfort.  Affect/Mood: Appropriate    Participation Level: Active and Engaged    Participation Quality: Independent    Behavior: Calm and Cooperative    Speech/Thought Process: Coherent    Insight: Good    Judgement: Good    Modes of Intervention: Guided Discussion and Music    Patient Response to Interventions:  Attentive, Engaged, Interested , and Receptive    Education Outcome:   Acknowledges education

## 2022-08-19 NOTE — Group Note (Signed)
Recreation Therapy Group Note   Group Topic:Coping Skills  Group Date: 08/19/2022 Start Time: 1400 End Time: 1500 Facilitators: Rosina Lowenstein, LRT, CTRS Location: Courtyard  Group Description: Music Reminisce. LRT encouraged patients to think of their favorite song(s) that reminded them of a positive memory or time in their life. LRT encouraged patient to talk about that memory aloud to the group. LRT played the song through a speaker for all to hear. LRT and patients discussed how thinking of a positive memory or time in their life can be used as a coping skill in everyday life post discharge.   Goal Area(s) Addressed: Patient will increase verbal communication by conversing with peers. Patient will contribute to group discussion with minimal prompting. Patient will reminisce a positive memory or moment in their life.   Affect/Mood: Appropriate   Participation Level: Active and Engaged   Participation Quality: Independent   Behavior: Calm and Cooperative   Speech/Thought Process: Coherent   Insight: Good   Judgement: Good   Modes of Intervention: Guided Discussion and Music   Patient Response to Interventions:  Attentive, Engaged, Interested , and Receptive   Education Outcome:  Acknowledges education   Clinical Observations/Individualized Feedback: Luis Miller was active in their participation of session activities and group discussion. Pt identified "Suspicious minds by Elvis reminds me of all the times I went to see him in concert. I am a really big fan". Pt shared that he feels like the doctor is getting ready for him to leave but that he doesn't feel like he is "quite ready yet". Pt had a bright affect when interacting and is noticeable better than in previous sessions. Pt interacted well with LRT and peers duration of session.    Plan: Continue to engage patient in RT group sessions 2-3x/week.   Rosina Lowenstein, LRT, CTRS 08/19/2022 3:25 PM

## 2022-08-19 NOTE — Progress Notes (Signed)
   08/19/22 2200  Psych Admission Type (Psych Patients Only)  Admission Status Voluntary  Psychosocial Assessment  Patient Complaints Depression  Eye Contact Fair  Facial Expression Animated  Affect Appropriate to circumstance  Speech Logical/coherent  Interaction Assertive  Motor Activity Slow  Appearance/Hygiene Unremarkable  Behavior Characteristics Cooperative;Appropriate to situation  Mood Pleasant  Thought Process  Coherency WDL  Content WDL  Delusions None reported or observed  Perception WDL  Hallucination None reported or observed  Judgment Impaired  Confusion None  Danger to Self  Current suicidal ideation? Denies  Danger to Others  Danger to Others None reported or observed

## 2022-08-19 NOTE — Progress Notes (Signed)
[facility]  ROUNDING NOTE   Subjective:   Patient seen and evaluated during dialysis   HEMODIALYSIS FLOWSHEET:  Blood Flow Rate (mL/min): 400 mL/min Arterial Pressure (mmHg): -170 mmHg Venous Pressure (mmHg): 250 mmHg TMP (mmHg): 6 mmHg Ultrafiltration Rate (mL/min): 0 mL/min (Uf off due to BP per request of RN. Pt goal decreased to 0.5L removal per NP.) Dialysate Flow Rate (mL/min): 300 ml/min Dialysis Fluid Bolus: Normal Saline Bolus Amount (mL): 100 mL\  Tolerating treatment well seated in chair   Objective:  Vital signs in last 24 hours:  Temp:  [97.8 F (36.6 C)-99.4 F (37.4 C)] 97.8 F (36.6 C) (05/30 0800) Pulse Rate:  [62-92] 66 (05/30 1000) Resp:  [12-18] 16 (05/30 1000) BP: (90-148)/(58-82) 90/58 (05/30 1000) SpO2:  [95 %-100 %] 100 % (05/30 1000) Weight:  [81.5 kg] 81.5 kg (05/30 0800)  Weight change:  Filed Weights   08/17/22 0801 08/17/22 1228 08/19/22 0800  Weight: 81.6 kg 81 kg 81.5 kg    Intake/Output: I/O last 3 completed shifts: In: 337 [P.O.:237; I.V.:100] Out: 850 [Urine:850]   Intake/Output this shift:  Total I/O In: 240 [P.O.:240] Out: 400 [Urine:400]  Physical Exam: General: NAD, seated in chair  Head: Normocephalic, atraumatic. Moist oral mucosal membranes  Eyes: Anicteric  Lungs:  Clear to auscultation  Heart: Regular rate and rhythm  Abdomen:  Soft, nontender  Extremities:  no peripheral edema.  Neurologic: Nonfocal, moving all four extremities  Skin: No lesions  Access: Left AVG    Basic Metabolic Panel: Recent Labs  Lab 08/17/22 0804 08/18/22 1654 08/19/22 0917  NA 134* 132* 134*  K 3.6 4.0 3.6  CL 99 95* 96*  CO2 21* 28 25  GLUCOSE 92 155* 178*  BUN 86* 60* 78*  CREATININE 6.61* 5.30* 6.22*  CALCIUM 9.5 9.2 9.0  PHOS 5.2* 3.8 4.1     Liver Function Tests: Recent Labs  Lab 08/17/22 0804 08/18/22 1654 08/19/22 0917  ALBUMIN 4.0 4.3 3.7    No results for input(s): "LIPASE", "AMYLASE" in  the last 168 hours. No results for input(s): "AMMONIA" in the last 168 hours.  CBC: Recent Labs  Lab 08/17/22 0804 08/18/22 1654 08/19/22 0922  WBC 4.2 4.5 3.5*  HGB 10.0* 10.4* 9.4*  HCT 27.6* 28.9* 26.0*  MCV 95.8 96.7 97.0  PLT 127* 131* 108*     Cardiac Enzymes: No results for input(s): "CKTOTAL", "CKMB", "CKMBINDEX", "TROPONINI" in the last 168 hours.  BNP: Invalid input(s): "POCBNP"  CBG: Recent Labs  Lab 08/13/22 1338  GLUCAP 92     Microbiology: Results for orders placed or performed in visit on 05/12/22  Microscopic Examination     Status: Abnormal   Collection Time: 05/12/22 10:39 AM   Urine  Result Value Ref Range Status   WBC, UA 6-10 (A) 0 - 5 /hpf Final   RBC, Urine 0-2 0 - 2 /hpf Final   Epithelial Cells (non renal) 0-10 0 - 10 /hpf Final   Bacteria, UA Moderate (A) None seen/Few Final    Coagulation Studies: No results for input(s): "LABPROT", "INR" in the last 72 hours.  Urinalysis: No results for input(s): "COLORURINE", "LABSPEC", "PHURINE", "GLUCOSEU", "HGBUR", "BILIRUBINUR", "KETONESUR", "PROTEINUR", "UROBILINOGEN", "NITRITE", "LEUKOCYTESUR" in the last 72 hours.  Invalid input(s): "APPERANCEUR"    Imaging: No results found.   Medications:    acetaminophen      allopurinol  100 mg Oral BID   ARIPiprazole  15 mg Oral QPC breakfast   benazepril  10  mg Oral QPM   buPROPion  150 mg Oral Daily   cholecalciferol  1,000 Units Oral Daily   doxepin  75 mg Oral QHS   feeding supplement (NEPRO CARB STEADY)  237 mL Oral Q24H   loratadine  10 mg Oral Daily   potassium chloride  10 mEq Oral Daily   rosuvastatin  10 mg Oral Daily   acetaminophen, acetaminophen, alteplase, alum & mag hydroxide-simeth, droperidol, fentaNYL (SUBLIMAZE) injection, fluticasone, heparin, lidocaine (PF), lidocaine-prilocaine, ondansetron (ZOFRAN) IV, oxyCODONE **OR** oxyCODONE, pentafluoroprop-tetrafluoroeth, polyethylene glycol, promethazine,  traZODone  Assessment/ Plan:  [name]. is a 77 y.o. white male with end stage renal disease on hemodialysis, hypertension, gout, depression, history of melanoma, crohn's, and CVA who presents to [facility] on 07/30/2022 for Bipolar depression (HCC) [F31.9]  [facility] T[facility] [address] Left AVG 87kg  End Stage Renal Disease on hemodialysis: Patient receiving dialysis, UF reduced to 0.5 L due to hypotension.  Will dialyze patient tomorrow due to KDU closure on Saturday.  Anemia with chronic kidney disease: Hgb 9.4.  Will continue to monitor.  Hypertension with chronic kidney disease: Hypotensive on treatment today. Current regimen of amlodipine, benazepril. Patient did not get his medications this morning prior to dialysis. -Currently prescribed benazepril daily, evening dosing - Patient hypotensive in dialysis, asymptomatic.  Secondary Hyperparathyroidism: outpatient PTH of 456.  Calcium and phosphorus within desired range.   LOS: 20   5/30/202410:56 AM

## 2022-08-19 NOTE — Progress Notes (Signed)
Patient was cooperative with treatment and medication regime on the shift.  The patient remains depressed, he denies SI, HI & AVH. He is currently in bed resting at this time with no new issues to report on shift thus far.

## 2022-08-19 NOTE — Anesthesia Postprocedure Evaluation (Signed)
Anesthesia Post Note  Patient: Luis Miller.  Procedure(s) Performed: ECT TX  Patient location during evaluation: PACU Anesthesia Type: General Level of consciousness: awake and alert Pain management: pain level controlled Vital Signs Assessment: post-procedure vital signs reviewed and stable Respiratory status: spontaneous breathing, nonlabored ventilation, respiratory function stable and patient connected to nasal cannula oxygen Cardiovascular status: blood pressure returned to baseline and stable Postop Assessment: no apparent nausea or vomiting Anesthetic complications: no   No notable events documented.   Last Vitals:  Vitals:   08/19/22 0708 08/19/22 0800  BP: 128/73 (!) 106/58  Pulse: 85 76  Resp:  16  Temp: 37.2 C 36.6 C  SpO2: 100% 99%    Last Pain:  Vitals:   08/19/22 0800  TempSrc: Oral  PainSc:                  Yevette Edwards

## 2022-08-20 ENCOUNTER — Ambulatory Visit: Payer: Medicare Other

## 2022-08-20 ENCOUNTER — Inpatient Hospital Stay: Payer: Medicare Other | Admitting: Certified Registered"

## 2022-08-20 ENCOUNTER — Encounter: Payer: Self-pay | Admitting: Psychiatry

## 2022-08-20 DIAGNOSIS — N2581 Secondary hyperparathyroidism of renal origin: Secondary | ICD-10-CM | POA: Diagnosis not present

## 2022-08-20 DIAGNOSIS — E1122 Type 2 diabetes mellitus with diabetic chronic kidney disease: Secondary | ICD-10-CM | POA: Diagnosis not present

## 2022-08-20 DIAGNOSIS — F3176 Bipolar disorder, in full remission, most recent episode depressed: Secondary | ICD-10-CM | POA: Diagnosis not present

## 2022-08-20 DIAGNOSIS — F319 Bipolar disorder, unspecified: Secondary | ICD-10-CM | POA: Diagnosis not present

## 2022-08-20 DIAGNOSIS — I120 Hypertensive chronic kidney disease with stage 5 chronic kidney disease or end stage renal disease: Secondary | ICD-10-CM | POA: Diagnosis not present

## 2022-08-20 DIAGNOSIS — D631 Anemia in chronic kidney disease: Secondary | ICD-10-CM | POA: Diagnosis not present

## 2022-08-20 DIAGNOSIS — N186 End stage renal disease: Secondary | ICD-10-CM | POA: Diagnosis not present

## 2022-08-20 DIAGNOSIS — Z992 Dependence on renal dialysis: Secondary | ICD-10-CM | POA: Diagnosis not present

## 2022-08-20 DIAGNOSIS — Z87891 Personal history of nicotine dependence: Secondary | ICD-10-CM | POA: Diagnosis not present

## 2022-08-20 MED ORDER — LABETALOL HCL 5 MG/ML IV SOLN
INTRAVENOUS | Status: AC
  Filled 2022-08-20: qty 4

## 2022-08-20 MED ORDER — PENTAFLUOROPROP-TETRAFLUOROETH EX AERO
INHALATION_SPRAY | CUTANEOUS | Status: AC
  Filled 2022-08-20: qty 30

## 2022-08-20 MED ORDER — KETAMINE HCL 10 MG/ML IJ SOLN
INTRAMUSCULAR | Status: DC | PRN
  Administered 2022-08-20: 80 mg via INTRAVENOUS

## 2022-08-20 MED ORDER — KETAMINE HCL 50 MG/5ML IJ SOSY
PREFILLED_SYRINGE | INTRAMUSCULAR | Status: AC
  Filled 2022-08-20: qty 5

## 2022-08-20 MED ORDER — LABETALOL HCL 5 MG/ML IV SOLN
INTRAVENOUS | Status: DC | PRN
  Administered 2022-08-20 (×2): 10 mg via INTRAVENOUS

## 2022-08-20 MED ORDER — ONDANSETRON HCL 4 MG/2ML IJ SOLN: 4.0000 mg | Freq: Once | INTRAMUSCULAR | Status: DC | PRN

## 2022-08-20 MED ORDER — FENTANYL CITRATE PF 50 MCG/ML IJ SOSY: 25.0000 ug | PREFILLED_SYRINGE | INTRAMUSCULAR | Status: DC | PRN

## 2022-08-20 MED ORDER — DOXEPIN HCL 50 MG PO CAPS
100.0000 mg | ORAL_CAPSULE | Freq: Every day | ORAL | Status: DC
  Administered 2022-08-20 – 2022-08-21 (×2): 100 mg via ORAL
  Filled 2022-08-20 (×2): qty 2

## 2022-08-20 MED ORDER — SODIUM CHLORIDE 0.9 % IV SOLN: 500.0000 mL | Freq: Once | INTRAVENOUS | Status: AC

## 2022-08-20 MED ORDER — SUCCINYLCHOLINE CHLORIDE 200 MG/10ML IV SOSY
PREFILLED_SYRINGE | INTRAVENOUS | Status: DC | PRN
  Administered 2022-08-20: 100 mg via INTRAVENOUS

## 2022-08-20 NOTE — Group Note (Signed)
Date:  08/20/2022 Time:  10:06 PM  Group Topic/Focus:  Building Self Esteem:   The Focus of this group is helping patients become aware of the effects of self-esteem on their lives, the things they and others do that enhance or undermine their self-esteem, seeing the relationship between their level of self-esteem and the choices they make and learning ways to enhance self-esteem. Coping With Mental Health Crisis:   The purpose of this group is to help patients identify strategies for coping with mental health crisis.  Group discusses possible causes of crisis and ways to manage them effectively. Conflict Resolution:   The focus of this group is to discuss the conflict resolution process and how it may be used upon discharge. Developing a Wellness Toolbox:   The focus of this group is to help patients develop a "wellness toolbox" with skills and strategies to promote recovery upon discharge. Early Warning Signs:   The focus of this group is to help patients identify signs or symptoms they exhibit before slipping into an unhealthy state or crisis. Emotional Education:   The focus of this group is to discuss what feelings/emotions are, and how they are experienced. Goals Group:   The focus of this group is to help patients establish daily goals to achieve during treatment and discuss how the patient can incorporate goal setting into their daily lives to aide in recovery. Healthy Communication:   The focus of this group is to discuss communication, barriers to communication, as well as healthy ways to communicate with others. Identifying Needs:   The focus of this group is to help patients identify their personal needs that have been historically problematic and identify healthy behaviors to address their needs. Making Healthy Choices:   The focus of this group is to help patients identify negative/unhealthy choices they were using prior to admission and identify positive/healthier coping strategies to  replace them upon discharge. Managing Feelings:   The focus of this group is to identify what feelings patients have difficulty handling and develop a plan to handle them in a healthier way upon discharge. Overcoming Stress:   The focus of this group is to define stress and help patients assess their triggers. Personal Choices and Values:   The focus of this group is to help patients assess and explore the importance of values in their lives, how their values affect their decisions, how they express their values and what opposes their expression. Rediscovering Joy:   The focus of this group is to explore various ways to relieve stress in a positive manner.    Participation Level:  Active  Participation Quality:  Appropriate  Affect:  Appropriate  Cognitive:  Alert and Appropriate  Insight: Appropriate, Good, and Improving  Engagement in Group:  Engaged and Supportive  Modes of Intervention:  Activity, Discussion, and Support  Additional Comments:    [name] 08/20/2022, 10:06 PM

## 2022-08-20 NOTE — Transfer of Care (Signed)
Immediate Anesthesia Transfer of Care Note  Patient: Luis Miller.  Procedure(s) Performed: ECT TX  Patient Location: PACU  Anesthesia Type:General  Level of Consciousness: drowsy  Airway & Oxygen Therapy: Patient Spontanous Breathing and Patient connected to nasal cannula oxygen  Post-op Assessment: Report given to RN and Post -op Vital signs reviewed and stable  Post vital signs: Reviewed and stable  Last Vitals:  Vitals Value Taken Time  BP 176/92 08/20/22 1252  Temp    Pulse 64 08/20/22 1254  Resp 13 08/20/22 1254  SpO2 99 % 08/20/22 1254  Vitals shown include unvalidated device data.  Last Pain:  Vitals:   08/20/22 1225  TempSrc:   PainSc: 0-No pain         Complications: No notable events documented.
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
## 2022-08-20 NOTE — Plan of Care (Signed)

## 2022-08-20 NOTE — Group Note (Signed)
Recreation Therapy Group Note   Group Topic:Leisure Education  Group Date: 08/20/2022 Start Time: 1400 End Time: 1445 Facilitators: Rosina Lowenstein, LRT, CTRS Location: Courtyard  Group Description: Leisure. Patients were given the option to choose from playing corn hole, playing with a deck or cards or listening to music. LRT and pts discussed the meaning of leisure, the importance of participating in leisure during their free time/when they're outside of the hospital, as well as how our leisure interests can also serve as coping skills. Pt identified two leisure interests and shared with the group.   Goal Area(s) Addressed:  Patient will identify a current leisure interest.  Patient will learn the definition of "leisure". Patient will practice making a positive decision. Patient will have the opportunity to try a new leisure activity. Patient will communicate with peers and LRT.   Affect/Mood: N/A   Participation Level: Did not attend    Clinical Observations/Individualized Feedback: Luis Miller did not attend group due to being off unit.  Plan: Continue to engage patient in RT group sessions 2-3x/week.   Rosina Lowenstein, LRT, CTRS 08/20/2022 3:11 PM
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
## 2022-08-20 NOTE — Progress Notes (Signed)
[facility]  ROUNDING NOTE   Subjective:   Patient seen and evaluated during dialysis   HEMODIALYSIS FLOWSHEET:  Blood Flow Rate (mL/min): 400 mL/min Arterial Pressure (mmHg): -170 mmHg Venous Pressure (mmHg): 240 mmHg TMP (mmHg): -5 mmHg Ultrafiltration Rate (mL/min): 767 mL/min Dialysate Flow Rate (mL/min): 300 ml/min Dialysis Fluid Bolus: Normal Saline Bolus Amount (mL): 100 mL  Patient seen sitting up in chair, tolerating treatment well   Objective:  Vital signs in last 24 hours:  Temp:  [97.2 F (36.2 C)-99.1 F (37.3 C)] 97.8 F (36.6 C) (05/31 1410) Pulse Rate:  [56-92] 58 (05/31 1434) Resp:  [13-18] 13 (05/31 1434) BP: (114-176)/(67-92) 118/67 (05/31 1434) SpO2:  [93 %-98 %] 98 % (05/31 1434) Weight:  [81.4 kg] 81.4 kg (05/31 1438)  Weight change:  Filed Weights   08/17/22 1228 08/19/22 0800 08/20/22 1438  Weight: 81 kg 81.5 kg 81.4 kg    Intake/Output: I/O last 3 completed shifts: In: 240 [P.O.:240] Out: 400 [Urine:400]   Intake/Output this shift:  Total I/O In: 200 [I.V.:200] Out: 575 [Urine:575]  Physical Exam: General: NAD, seated in chair  Head: Normocephalic, atraumatic. Moist oral mucosal membranes  Eyes: Anicteric  Lungs:  Clear to auscultation  Heart: Sinus bradycardia  Abdomen:  Soft, nontender  Extremities:  no peripheral edema.  Neurologic: Nonfocal, moving all four extremities  Skin: No lesions  Access: Left AVG    Basic Metabolic Panel: Recent Labs  Lab 08/17/22 0804 08/18/22 1654 08/19/22 0917  NA 134* 132* 134*  K 3.6 4.0 3.6  CL 99 95* 96*  CO2 21* 28 25  GLUCOSE 92 155* 178*  BUN 86* 60* 78*  CREATININE 6.61* 5.30* 6.22*  CALCIUM 9.5 9.2 9.0  PHOS 5.2* 3.8 4.1     Liver Function Tests: Recent Labs  Lab 08/17/22 0804 08/18/22 1654 08/19/22 0917  ALBUMIN 4.0 4.3 3.7    No results for input(s): "LIPASE", "AMYLASE" in the last 168 hours. No results for input(s): "AMMONIA" in the last 168  hours.  CBC: Recent Labs  Lab 08/17/22 0804 08/18/22 1654 08/19/22 0922  WBC 4.2 4.5 3.5*  HGB 10.0* 10.4* 9.4*  HCT 27.6* 28.9* 26.0*  MCV 95.8 96.7 97.0  PLT 127* 131* 108*     Cardiac Enzymes: No results for input(s): "CKTOTAL", "CKMB", "CKMBINDEX", "TROPONINI" in the last 168 hours.  BNP: Invalid input(s): "POCBNP"  CBG: No results for input(s): "GLUCAP" in the last 168 hours.   Microbiology: Results for orders placed or performed in visit on 05/12/22  Microscopic Examination     Status: Abnormal   Collection Time: 05/12/22 10:39 AM   Urine  Result Value Ref Range Status   WBC, UA 6-10 (A) 0 - 5 /hpf Final   RBC, Urine 0-2 0 - 2 /hpf Final   Epithelial Cells (non renal) 0-10 0 - 10 /hpf Final   Bacteria, UA Moderate (A) None seen/Few Final    Coagulation Studies: No results for input(s): "LABPROT", "INR" in the last 72 hours.  Urinalysis: No results for input(s): "COLORURINE", "LABSPEC", "PHURINE", "GLUCOSEU", "HGBUR", "BILIRUBINUR", "KETONESUR", "PROTEINUR", "UROBILINOGEN", "NITRITE", "LEUKOCYTESUR" in the last 72 hours.  Invalid input(s): "APPERANCEUR"    Imaging: No results found.   Medications:      allopurinol  100 mg Oral BID   ARIPiprazole  15 mg Oral QPC breakfast   benazepril  10 mg Oral QPM   buPROPion  150 mg Oral Daily   cholecalciferol  1,000 Units Oral Daily   doxepin  100 mg Oral QHS   feeding supplement (NEPRO CARB STEADY)  237 mL Oral Q24H   loratadine  10 mg Oral Daily   potassium chloride  10 mEq Oral Daily   rosuvastatin  10 mg Oral Daily   acetaminophen, alteplase, alum & mag hydroxide-simeth, droperidol, fentaNYL (SUBLIMAZE) injection, fluticasone, heparin, lidocaine (PF), lidocaine-prilocaine, ondansetron (ZOFRAN) IV, oxyCODONE **OR** oxyCODONE, pentafluoroprop-tetrafluoroeth, polyethylene glycol, promethazine, traZODone  Assessment/ Plan:  [name]. is a 77 y.o. white male with end stage renal disease on  hemodialysis, hypertension, gout, depression, history of melanoma, crohn's, and CVA who presents to [facility] on 07/30/2022 for Bipolar depression (HCC) [F31.9]  [facility] [address] Left AVG 87kg  End Stage Renal Disease on hemodialysis: Patient received dialysis treatment yesterday, no UF due to hypotension.  Due to KDU closure, patient will receive treatment today.  Next treatment scheduled for Tuesday.  Anemia with chronic kidney disease: Hgb 9.4.  No need for ESA  Hypertension with chronic kidney disease: Hypotensive on treatment today. Current regimen of amlodipine, benazepril. Patient did not get his medications this morning prior to dialysis. -Currently prescribed benazepril daily, evening dosing - Blood pressure 136/65 at initiation of dialysis.  Will continue to monitor.  Secondary Hyperparathyroidism: outpatient PTH of 456.  Calcium and phosphorus within desired range.   LOS: 21   5/31/20242:49 PM

## 2022-08-20 NOTE — Progress Notes (Signed)
   08/20/22 9604  15 Minute Checks  Location Bedroom  Visual Appearance Calm  Behavior Sleeping  Sleep (Behavioral Health Patients Only)  Calculate sleep? (Click Yes once per 24 hr at 0600 safety check) Yes  Documented sleep last 24 hours 8

## 2022-08-20 NOTE — Progress Notes (Signed)
TMP alarming  only option to change cartridge.  New set up in place and tx restarted

## 2022-08-20 NOTE — Progress Notes (Signed)
   08/20/22 2200  Psych Admission Type (Psych Patients Only)  Admission Status Voluntary  Psychosocial Assessment  Patient Complaints Depression  Eye Contact Fair  Facial Expression Flat  Affect Appropriate to circumstance  Speech Logical/coherent  Interaction Assertive  Motor Activity Slow  Appearance/Hygiene Unremarkable  Behavior Characteristics Cooperative;Appropriate to situation  Mood Pleasant  Thought Process  Coherency WDL  Content WDL  Delusions None reported or observed  Perception WDL  Hallucination None reported or observed  Judgment WDL  Confusion None  Danger to Self  Current suicidal ideation? Denies  Danger to Others  Danger to Others None reported or observed

## 2022-08-20 NOTE — Progress Notes (Signed)
Patient pleasant and cooperative.  Scheduled to have both ECT and dialysis today.  Patient reports he slept well.  Endorses depression.  Denies SI, HI and AVH.  Brighter affect this morning.  Scheduled medications held until due to procedures. Compliant with scheduled medications. 15 min checks in place for safety.    Patient taken to ECT at 1140.  Returned to unit at 1330.  A&O x 4.  Unsteady gait - needing assistance to walk to bathroom. VS WNL.  Patient given drink and lunch tray ordered.  Dialysis unit notified the patient had returned to gero unit.  Patient left for dialysis at 1405. Returned to unit at 1845.

## 2022-08-20 NOTE — Anesthesia Postprocedure Evaluation (Signed)
Anesthesia Post Note  Patient: Luis Miller.  Procedure(s) Performed: ECT TX  Patient location during evaluation: PACU Anesthesia Type: General Level of consciousness: awake and awake and alert Pain management: pain level controlled Vital Signs Assessment: post-procedure vital signs reviewed and stable Respiratory status: spontaneous breathing Cardiovascular status: stable Anesthetic complications: no   No notable events documented.   Last Vitals:  Vitals:   08/20/22 1252 08/20/22 1301  BP: (!) 176/92 (!) 147/89  Pulse:  92  Resp: 18 17  Temp: 36.6 C   SpO2: 98% 95%    Last Pain:  Vitals:   08/20/22 1301  TempSrc:   PainSc: 0-No pain                 VAN STAVEREN,[name]
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
## 2022-08-20 NOTE — Procedures (Signed)
ECT SERVICES Physician's Interval Evaluation & Treatment Note  Patient Identification: Luis Miller. MRN:  [id number] Date of Evaluation:  08/20/2022 TX #: [id number]  MADRS:   MMSE:   P.E. Findings:  No change to physical exam  Psychiatric Interval Note:  Affect slightly better mood stated as being near his baseline  Subjective:  Patient is a 77 y.o. male seen for evaluation for Electroconvulsive Therapy. Patient feels improved and is not complaining of any side effects  Treatment Summary:   [x]   Right Unilateral             []  Bilateral   % Energy : 0.3 ms 100%   Impedance: 1480 ohms  Seizure Energy Index: No reading  Postictal Suppression Index: No reading  Seizure Concordance Index: No reading  Medications  Pre Shock: Ketamine 80 mg succinylcholine 100 mg  Post Shock:    Seizure Duration: 13 seconds EMG 17 seconds EEG   Comments: Not sure why we had such a good treatment last time but such a short 1 again this time.  Once again using all ketamine.  We are going to reassess and see whether we need to continue treatment on Monday since he is feeling better.  Lungs:  [x]   Clear to auscultation               []  Other:   Heart:    [x]   Regular rhythm             []  irregular rhythm    [x]   Previous H&P reviewed, patient examined and there are NO CHANGES                 []   Previous H&P reviewed, patient examined and there are changes noted.   Mordecai Rasmussen, MD 5/31/20242:49 PM

## 2022-08-20 NOTE — [facility] Group Notes (Signed)
[facility] Group Notes:  (Nursing/MHT/Case Management/Adjunct)  Date:  08/20/2022  Time:  10:17 AM  Type of Therapy:   community meeting  Participation Level:  Active  Participation Quality:  Appropriate  Affect:  Appropriate  Cognitive:  Appropriate  Insight:  Appropriate  Engagement in Group:  Engaged  Modes of Intervention:  Discussion and Education  Summary of Progress/Problems:  Luis Miller 08/20/2022, 10:17 AM

## 2022-08-21 DIAGNOSIS — F319 Bipolar disorder, unspecified: Secondary | ICD-10-CM | POA: Diagnosis not present

## 2022-08-21 LAB — CBC
HCT: 26.9 % — ABNORMAL LOW (ref 39.0–52.0)
Hemoglobin: 9.4 g/dL — ABNORMAL LOW (ref 13.0–17.0)
MCH: 34.4 pg — ABNORMAL HIGH (ref 26.0–34.0)
MCHC: 34.9 g/dL (ref 30.0–36.0)
MCV: 98.5 fL (ref 80.0–100.0)
Platelets: 108 10*3/uL — ABNORMAL LOW (ref 150–400)
RBC: 2.73 MIL/uL — ABNORMAL LOW (ref 4.22–5.81)
RDW: 12.5 % (ref 11.5–15.5)
WBC: 3.8 10*3/uL — ABNORMAL LOW (ref 4.0–10.5)
nRBC: 0 % (ref 0.0–0.2)

## 2022-08-21 LAB — RENAL FUNCTION PANEL
Albumin: 3.7 g/dL (ref 3.5–5.0)
Anion gap: 10 (ref 5–15)
BUN: 64 mg/dL — ABNORMAL HIGH (ref 8–23)
CO2: 28 mmol/L (ref 22–32)
Calcium: 9.1 mg/dL (ref 8.9–10.3)
Chloride: 97 mmol/L — ABNORMAL LOW (ref 98–111)
Creatinine, Ser: 5.24 mg/dL — ABNORMAL HIGH (ref 0.61–1.24)
GFR, Estimated: 11 mL/min — ABNORMAL LOW (ref >60)
Glucose, Bld: 161 mg/dL — ABNORMAL HIGH (ref 70–99)
Phosphorus: 4 mg/dL (ref 2.5–4.6)
Potassium: 4.1 mmol/L (ref 3.5–5.1)
Sodium: 135 mmol/L (ref 135–145)

## 2022-08-21 NOTE — Group Note (Signed)
Date:  08/21/2022 Time:  7:12 PM  Group Topic/Focus:  Rec Group   The focus of this group is to let the patients go out and get fresh air and listen to music of their choices.     Participation Level:  Active  Participation Quality:  Appropriate  Affect:  Appropriate  Cognitive:  Alert and Appropriate  Insight: Appropriate  Engagement in Group:  Engaged  Modes of Intervention:  Activity  Additional Comments:    Marta Antu 08/21/2022, 7:12 PM

## 2022-08-21 NOTE — Group Note (Signed)
Date:  08/21/2022 Time:  10:59 AM  Group Topic/Focus:  Group Movement-Chair Zumba     Participation Level:  Active  Participation Quality:  Appropriate  Affect:  Appropriate  Cognitive:  Alert and Appropriate  Insight: Appropriate  Engagement in Group:  Engaged  Modes of Intervention:  Socialization  Additional Comments:    Marta Antu 08/21/2022, 10:59 AM

## 2022-08-21 NOTE — Progress Notes (Signed)
   08/21/22 0626  15 Minute Checks  Location Bedroom  Visual Appearance Calm  Behavior Sleeping  Sleep (Behavioral Health Patients Only)  Calculate sleep? (Click Yes once per 24 hr at 0600 safety check) Yes  Documented sleep last 24 hours 9.25

## 2022-08-21 NOTE — Progress Notes (Signed)
   08/21/22 2000  Psych Admission Type (Psych Patients Only)  Admission Status Voluntary  Psychosocial Assessment  Patient Complaints Depression  Eye Contact Fair  Facial Expression Flat  Affect Appropriate to circumstance  Speech Logical/coherent  Interaction Assertive  Motor Activity Slow  Appearance/Hygiene In scrubs  Behavior Characteristics Cooperative  Mood Depressed;Pleasant  Thought Process  Coherency WDL  Content WDL  Delusions None reported or observed  Perception WDL  Hallucination None reported or observed  Judgment WDL  Confusion None  Danger to Self  Current suicidal ideation? Denies  Agreement Not to Harm Self Yes  Description of Agreement Verbal  Danger to Others  Danger to Others None reported or observed
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
## 2022-08-21 NOTE — Plan of Care (Signed)
Assumed care of patient this am, patient sitting quietly, watching TV, affect and mood seems depressed however, he reports feeling better. Patient stated he did not feel/see any improvement  from his ECT treatments and stated he rather not have any further treatment. Patient was encouraged to talk to his provider about his thoughts/feeling r/t his treatments. Patient denied having thoughts, plan or intent to harm self or others. Problem: Education: Goal: Knowledge of General Education information will improve Description: Including pain rating scale, medication(s)/side effects and non-pharmacologic comfort measures Outcome: Progressing   Problem: Health Behavior/Discharge Planning: Goal: Ability to manage health-related needs will improve Outcome: Progressing   Problem: Clinical Measurements: Goal: Ability to maintain clinical measurements within normal limits will improve Outcome: Progressing Goal: Will remain free from infection Outcome: Progressing Goal: Diagnostic test results will improve Outcome: Progressing Goal: Respiratory complications will improve Outcome: Progressing Goal: Cardiovascular complication will be avoided Outcome: Progressing   Problem: Activity: Goal: Risk for activity intolerance will decrease Outcome: Progressing

## 2022-08-22 DIAGNOSIS — F319 Bipolar disorder, unspecified: Secondary | ICD-10-CM | POA: Diagnosis not present

## 2022-08-22 MED ORDER — DOXEPIN HCL 25 MG PO CAPS
125.0000 mg | ORAL_CAPSULE | Freq: Every day | ORAL | Status: DC
  Administered 2022-08-22 – 2022-08-23 (×2): 125 mg via ORAL
  Filled 2022-08-22 (×2): qty 1

## 2022-08-22 NOTE — Progress Notes (Signed)
Patient denies SI, HI, and AVH. He appears flat and depressed. He says he does not really want to continue with ECT because he doesn't think it is helping. However, he is willing to do whatever the doctor suggests he do. When asked about depression, he says, "I don't think I'm depressed, I just have a hard time understanding things. When I watch TV, I don't know what is going on." Patient says that he is worried he will not be able to take care of himself after discharge from the hospital.   Patient is compliant with scheduled medications. He interacts appropriately with staff and other patients. He remains safe on the unit at this time.

## 2022-08-22 NOTE — Progress Notes (Signed)
   08/22/22 0600  15 Minute Checks  Location Bedroom  Visual Appearance Calm  Behavior Sleeping  Sleep (Behavioral Health Patients Only)  Calculate sleep? (Click Yes once per 24 hr at 0600 safety check) Yes  Documented sleep last 24 hours 8

## 2022-08-22 NOTE — Progress Notes (Signed)
   08/22/22 2200  Psych Admission Type (Psych Patients Only)  Admission Status Voluntary  Psychosocial Assessment  Patient Complaints Other (Comment) ("i am not so much depressed, i just dont know what i am")  Eye Contact Fair  Facial Expression Flat  Affect Sad  Speech Logical/coherent  Interaction Assertive  Motor Activity Slow  Appearance/Hygiene Unremarkable  Behavior Characteristics Cooperative  Mood Depressed  Thought Process  Coherency WDL  Content Ambivalence  Delusions None reported or observed  Perception WDL  Hallucination None reported or observed  Judgment Impaired  Confusion None  Danger to Self  Current suicidal ideation? Denies  Agreement Not to Harm Self Yes  Description of Agreement Verbal  Danger to Others  Danger to Others None reported or observed
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
## 2022-08-22 NOTE — Group Note (Signed)
LCSW Group Therapy Note   Group Date: 08/22/2022 Start Time: 1305 End Time: 1340   Type of Therapy and Topic:  Group Therapy: Fear of Asking For Help  Participation Level:  Active    Summary of Patient Progress:  The patient attended group. The patient participated in today's icebreaker. The patient stated that his fear for asking for help comes from not wanting to be a burden.     Marshell Levan, LCSWA 08/22/2022  3:52 PM

## 2022-08-22 NOTE — Group Note (Signed)
Date:  08/22/2022 Time:  9:08 PM  Group Topic/Focus:  Overcoming Stress:   The focus of this group is to define stress and help patients assess their triggers.    Participation Level:  Active  Participation Quality:  Appropriate  Affect:  Appropriate  Cognitive:  Alert  Insight: Good  Engagement in Group:  Developing/Improving  Modes of Intervention:  Discussion  Additional Comments:    Garry Heater 08/22/2022, 9:08 PM

## 2022-08-23 ENCOUNTER — Inpatient Hospital Stay: Payer: Medicare Other | Admitting: Anesthesiology

## 2022-08-23 ENCOUNTER — Encounter: Payer: Self-pay | Admitting: Psychiatry

## 2022-08-23 ENCOUNTER — Other Ambulatory Visit: Payer: Self-pay | Admitting: Psychiatry

## 2022-08-23 ENCOUNTER — Ambulatory Visit: Payer: Medicare Other

## 2022-08-23 DIAGNOSIS — I120 Hypertensive chronic kidney disease with stage 5 chronic kidney disease or end stage renal disease: Secondary | ICD-10-CM | POA: Diagnosis not present

## 2022-08-23 DIAGNOSIS — Z87891 Personal history of nicotine dependence: Secondary | ICD-10-CM | POA: Diagnosis not present

## 2022-08-23 DIAGNOSIS — Z992 Dependence on renal dialysis: Secondary | ICD-10-CM | POA: Diagnosis not present

## 2022-08-23 DIAGNOSIS — N186 End stage renal disease: Secondary | ICD-10-CM | POA: Diagnosis not present

## 2022-08-23 DIAGNOSIS — F319 Bipolar disorder, unspecified: Secondary | ICD-10-CM | POA: Diagnosis not present

## 2022-08-23 DIAGNOSIS — E1122 Type 2 diabetes mellitus with diabetic chronic kidney disease: Secondary | ICD-10-CM | POA: Diagnosis not present

## 2022-08-23 MED ORDER — LABETALOL HCL 5 MG/ML IV SOLN
INTRAVENOUS | Status: AC
  Filled 2022-08-23: qty 4

## 2022-08-23 MED ORDER — SUCCINYLCHOLINE CHLORIDE 200 MG/10ML IV SOSY
PREFILLED_SYRINGE | INTRAVENOUS | Status: AC
  Filled 2022-08-23: qty 20

## 2022-08-23 MED ORDER — KETAMINE HCL 50 MG/5ML IJ SOSY
PREFILLED_SYRINGE | INTRAMUSCULAR | Status: AC
  Filled 2022-08-23: qty 5

## 2022-08-23 MED ORDER — SUCCINYLCHOLINE CHLORIDE 200 MG/10ML IV SOSY
PREFILLED_SYRINGE | INTRAVENOUS | Status: DC | PRN
  Administered 2022-08-23: 100 mg via INTRAVENOUS

## 2022-08-23 MED ORDER — KETAMINE HCL 10 MG/ML IJ SOLN
INTRAMUSCULAR | Status: DC | PRN
  Administered 2022-08-23: 80 mg via INTRAVENOUS

## 2022-08-23 MED ORDER — LABETALOL HCL 5 MG/ML IV SOLN
INTRAVENOUS | Status: DC | PRN
  Administered 2022-08-23 (×3): 5 mg via INTRAVENOUS

## 2022-08-23 MED ORDER — SODIUM CHLORIDE 0.9 % IV SOLN: 500.0000 mL | Freq: Once | INTRAVENOUS | Status: AC

## 2022-08-23 NOTE — Group Note (Signed)
Date:  08/23/2022 Time:  11:20 AM  Group Topic/Focus:  Developing a Wellness Toolbox:   The focus of this group is to help patients develop a "wellness toolbox" with skills and strategies to promote recovery upon discharge. Managing Feelings:   The focus of this group is to identify what feelings patients have difficulty handling and develop a plan to handle them in a healthier way upon discharge. Wellness Toolbox:   The focus of this group is to discuss various aspects of wellness, balancing those aspects and exploring ways to increase the ability to experience wellness.  Patients will create a wellness toolbox for use upon discharge.    Participation Level:  Active  Participation Quality:  Appropriate and Attentive  Affect:  Appropriate  Cognitive:  Alert, Appropriate, and Oriented  Insight: Appropriate  Engagement in Group:  Engaged  Modes of Intervention:  Activity, Discussion, and Socialization  Additional Comments:  Mr. Roesch had difficulty with hand dexterity so the craft project may have been too complicated for him, but he participated fully and engaged well.  Leonie Green 08/23/2022, 11:20 AM

## 2022-08-23 NOTE — Procedures (Signed)
ECT SERVICES Physician's Interval Evaluation & Treatment Note  Patient Identification: Luis Miller. MRN:  [id number] Date of Evaluation:  08/23/2022 TX #: [id number]  MADRS:   MMSE:   P.E. Findings:  No change to physical exam  Psychiatric Interval Note:  More down a little more withdrawn.  Not actively suicidal and not psychotic  Subjective:  Patient is a 77 y.o. male seen for evaluation for Electroconvulsive Therapy. Still feeling depressed sluggish withdrawn and somewhat hopeless  Treatment Summary:   [x]   Right Unilateral             []  Bilateral   % Energy : 0.3 ms 100%   Impedance: 1920 ohms  Seizure Energy Index: 81,975 V squared  Postictal Suppression Index: No reading  Seizure Concordance Index: No meaningful reading one of the leads had come off  Medications  Pre Shock: Ketamine 80 mg succinylcholine 100 mg  Post Shock:    Seizure Duration: 18 seconds EMG 19 seconds EEG   Comments: Not sure why he is not having affective seizures but at this point there may be nothing we can do about it.  He is not being given any anticonvulsants or benzodiazepines on the unit.  We will reassess tomorrow and may then have to discontinue ECT if there is no improvement.  Lungs:  [x]   Clear to auscultation               []  Other:   Heart:    [x]   Regular rhythm             []  irregular rhythm    [x]   Previous H&P reviewed, patient examined and there are NO CHANGES                 []   Previous H&P reviewed, patient examined and there are changes noted.   Mordecai Rasmussen, MD 6/3/20243:54 PM
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
## 2022-08-23 NOTE — Plan of Care (Signed)

## 2022-08-23 NOTE — Progress Notes (Signed)
Patient returned from ECT.  VS WNL.  Patient is alert and oriented x 4.  Unsteady gait - assisted to bathroom and dayroom.  Lunch tray provided.

## 2022-08-23 NOTE — Group Note (Addendum)
[facility] LCSW Group Therapy Note    Group Date: 08/23/2022 Start Time: 1300 End Time: 1400  Type of Therapy and Topic:  Group Therapy:  Overcoming Obstacles  Participation Level:  [facility] PARTICIPATION LEVEL: Did Not Attend  Mood:  Description of Group:   In this group patients will be encouraged to explore what they see as obstacles to their own wellness and recovery. They will be guided to discuss their thoughts, feelings, and behaviors related to these obstacles. The group will process together ways to cope with barriers, with attention given to specific choices patients can make. Each patient will be challenged to identify changes they are motivated to make in order to overcome their obstacles. This group will be process-oriented, with patients participating in exploration of their own experiences as well as giving and receiving support and challenge from other group members.  Therapeutic Goals: 1. Patient will identify personal and current obstacles as they relate to admission. 2. Patient will identify barriers that currently interfere with their wellness or overcoming obstacles.  3. Patient will identify feelings, thought process and behaviors related to these barriers. 4. Patient will identify two changes they are willing to make to overcome these obstacles:    Summary of Patient Progress   Patient did not attend group despite encouraged participation.     Therapeutic Modalities:   Cognitive Behavioral Therapy Solution Focused Therapy Motivational Interviewing Relapse Prevention Therapy   Corky Crafts, [place]
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
## 2022-08-23 NOTE — Progress Notes (Signed)
   08/23/22 0553  15 Minute Checks  Location Bedroom  Visual Appearance Calm  Behavior Composed  Sleep (Behavioral Health Patients Only)  Calculate sleep? (Click Yes once per 24 hr at 0600 safety check) Yes  Documented sleep last 24 hours 8.25

## 2022-08-23 NOTE — Transfer of Care (Signed)
Immediate Anesthesia Transfer of Care Note  Patient: Luis Miller.  Procedure(s) Performed: ECT TX  Patient Location: PACU  Anesthesia Type:General  Level of Consciousness: drowsy and patient cooperative  Airway & Oxygen Therapy: Patient Spontanous Breathing  Post-op Assessment: Report given to RN and Post -op Vital signs reviewed and stable  Post vital signs: Reviewed and stable  Last Vitals:  Vitals Value Taken Time  BP 192/95 08/23/22 1321  Temp 36.9 C 08/23/22 1321  Pulse 66 08/23/22 1326  Resp 14 08/23/22 1326  SpO2 97 % 08/23/22 1326  Vitals shown include unvalidated device data.  Last Pain:  Vitals:   08/23/22 1321  TempSrc:   PainSc: Asleep         Complications: No notable events documented.

## 2022-08-23 NOTE — Progress Notes (Signed)
   08/23/22 2100  Psych Admission Type (Psych Patients Only)  Admission Status Voluntary  Psychosocial Assessment  Patient Complaints Depression  Eye Contact Fair  Facial Expression Flat  Affect Sad  Speech Logical/coherent  Interaction Assertive  Motor Activity Slow  Appearance/Hygiene Unremarkable  Behavior Characteristics Cooperative;Appropriate to situation  Mood Sad  Thought Process  Coherency WDL  Content WDL  Delusions None reported or observed  Perception WDL  Hallucination None reported or observed  Judgment WDL  Confusion None  Danger to Self  Current suicidal ideation? Denies  Agreement Not to Harm Self Yes  Description of Agreement Verbal  Danger to Others  Danger to Others None reported or observed

## 2022-08-23 NOTE — Anesthesia Postprocedure Evaluation (Signed)
Anesthesia Post Note  Patient: Luis Miller.  Procedure(s) Performed: ECT TX  Patient location during evaluation: PACU Anesthesia Type: General Level of consciousness: awake and alert Pain management: pain level controlled Vital Signs Assessment: post-procedure vital signs reviewed and stable Respiratory status: spontaneous breathing, nonlabored ventilation and respiratory function stable Cardiovascular status: blood pressure returned to baseline and stable Postop Assessment: no apparent nausea or vomiting Anesthetic complications: no   No notable events documented.   Last Vitals:  Vitals:   08/23/22 1350 08/23/22 1400  BP: (!) 157/84 (!) 154/80  Pulse: 63 66  Resp: 14 11  Temp:  36.9 C  SpO2: 96% 95%    Last Pain:  Vitals:   08/23/22 1400  TempSrc:   PainSc: 0-No pain                 Foye Deer

## 2022-08-23 NOTE — Group Note (Signed)
Recreation Therapy Group Note   Group Topic:Goal Setting  Group Date: 08/23/2022 Start Time: 1400 End Time: 1445 Facilitators: Rosina Lowenstein, LRT, CTRS Location:  Dayroom  Group Description: Vision Board. Patients were given many different magazines, a glue stick, markers, and a piece of cardstock paper. LRT and pts discussed the importance of having goals in life. LRT and pts discussed the difference between short-term and long-term goals, as well as what a SMART goal is. LRT encouraged pts to create a vision board, with images they picked and then cut out with safety scissors from the magazine, for themselves, that capture their short and long-term goals. LRT encouraged pts to show and explain their vision board to the group. LRT offered to laminate vision board once dry and complete.   Goal Area(s) Addressed:  Patient will gain knowledge of short vs. long term goals.  Patient will identify goals for themselves. Patient will practice setting SMART goals. Patient will verbalize their goals to LRT and peers.  Affect/Mood: N/A   Participation Level: Did not attend    Clinical Observations/Individualized Feedback: Luis Miller did not attend group due to being at ECT.  Plan: Continue to engage patient in RT group sessions 2-3x/week.   Rosina Lowenstein, LRT, CTRS 08/23/2022 3:10 PM
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
## 2022-08-23 NOTE — Progress Notes (Signed)
Patient pleasant with flat sad affect.  Patient reports he did not sleep well.  Continues to endorse depression.  Patient has decided to continue with ECT treatments.  NPO since midnight.  Medications will be administered after ECT.  15 min checks in place for safety.  Patient is present in the milieu.  Appropriate interaction with peers and staff.

## 2022-08-23 NOTE — BH IP Treatment Plan (Signed)
Interdisciplinary Treatment and Diagnostic Plan Update  08/23/2022 Time of Session: 0830 Luis Miller. MRN: [id number]  Principal Diagnosis: Bipolar depression (HCC)  Secondary Diagnoses: Principal Problem:   Bipolar depression (HCC)   Current Medications:  Current Facility-Administered Medications  Medication Dose Route Frequency Provider Last Rate Last Admin   acetaminophen (TYLENOL) tablet 650 mg  650 mg Oral Q6H PRN Clapacs, Jackquline Denmark, MD   650 mg at 08/11/22 1352   allopurinol (ZYLOPRIM) tablet 100 mg  100 mg Oral BID Clapacs, [name], MD   100 mg at 08/22/22 2103   alteplase (CATHFLO ACTIVASE) injection 2 mg  2 mg Intracatheter Once PRN Cherylann Ratel, Munsoor, MD       alum & mag hydroxide-simeth (MAALOX/MYLANTA) 200-200-20 MG/5ML suspension 30 mL  30 mL Oral Q4H PRN Clapacs, [name], MD       ARIPiprazole (ABILIFY) tablet 15 mg  15 mg Oral QPC breakfast [name], DO   15 mg at 08/22/22 0908   benazepril (LOTENSIN) tablet 10 mg  10 mg Oral QPM Mosetta Pigeon, MD   10 mg at 08/22/22 1704   buPROPion (WELLBUTRIN XL) 24 hr tablet 150 mg  150 mg Oral Daily [name], DO   150 mg at 08/22/22 4098   cholecalciferol (VITAMIN D3) 25 MCG (1000 UNIT) tablet 1,000 Units  1,000 Units Oral Daily Clapacs, [name], MD   1,000 Units at 08/22/22 0908   doxepin (SINEQUAN) capsule 125 mg  125 mg Oral QHS [name], DO   125 mg at 08/22/22 2103   droperidol (INAPSINE) 2.5 MG/ML injection 0.625 mg  0.625 mg Intravenous Once PRN Yevette Edwards, MD       feeding supplement (NEPRO CARB STEADY) liquid 237 mL  237 mL Oral Q24H Mosetta Pigeon, MD   237 mL at 08/22/22 1201   fentaNYL (SUBLIMAZE) injection 25 mcg  25 mcg Intravenous Q5 min PRN Darleene Cleaver, Gijsbertus F, MD       fluticasone (FLONASE) 50 MCG/ACT nasal spray 2 spray  2 spray Each Nare Daily PRN Clapacs, Jackquline Denmark, MD       heparin injection 1,000 Units  1,000 Units Dialysis PRN Lateef, Munsoor, MD       lidocaine  (PF) (XYLOCAINE) 1 % injection 5 mL  5 mL Intradermal PRN Lateef, Munsoor, MD       lidocaine-prilocaine (EMLA) cream 1 Application  1 Application Topical PRN Lateef, Munsoor, MD   1 Application at 08/12/22 0700   loratadine (CLARITIN) tablet 10 mg  10 mg Oral Daily Clapacs, [name], MD   10 mg at 08/22/22 0908   ondansetron (ZOFRAN) injection 4 mg  4 mg Intravenous Once PRN Darleene Cleaver, Gijsbertus F, MD       oxyCODONE (Oxy IR/ROXICODONE) immediate release tablet 5 mg  5 mg Oral Once PRN Yevette Edwards, MD       Or   oxyCODONE (ROXICODONE) 5 MG/5ML solution 5 mg  5 mg Oral Once PRN Yevette Edwards, MD       pentafluoroprop-tetrafluoroeth (GEBAUERS) aerosol 1 Application  1 Application Topical PRN Lateef, Munsoor, MD       polyethylene glycol (MIRALAX / GLYCOLAX) packet 17 g  17 g Oral BID PRN Reggie Pile, MD   17 g at 08/22/22 1732   potassium chloride (KLOR-CON M) CR tablet 10 mEq  10 mEq Oral Daily [name], DO   10 mEq at 08/22/22 0907   promethazine (PHENERGAN) injection 6.25-12.5  mg  6.25-12.5 mg Intravenous Q15 min PRN Yevette Edwards, MD       rosuvastatin (CRESTOR) tablet 10 mg  10 mg Oral Daily Clapacs, [name], MD   10 mg at 08/22/22 1610   traZODone (DESYREL) tablet 50 mg  50 mg Oral QHS PRN [name], DO   50 mg at 08/22/22 2103   PTA Medications: Medications Prior to Admission  Medication Sig Dispense Refill Last Dose   amLODipine (NORVASC) 10 MG tablet TAKE 1 TABLET DAILY 90 tablet 1 08/19/2022   benazepril (LOTENSIN) 40 MG tablet Take 40 mg by mouth daily.    08/19/2022   calcitRIOL (ROCALTROL) 0.25 MCG capsule Take 0.25 mcg by mouth daily.   08/19/2022   FLUoxetine (PROZAC) 20 MG capsule Take 1 capsule (20 mg total) by mouth daily. 90 capsule 0 08/19/2022   fluticasone (FLONASE) 50 MCG/ACT nasal spray Place 2 sprays into both nostrils daily. 16 g 6 08/19/2022   hydrALAZINE (APRESOLINE) 100 MG tablet Take 100 mg by mouth 3 (three) times daily.   08/19/2022    lamoTRIgine (LAMICTAL) 25 MG tablet TAKE 1 TABLET DAILY 90 tablet 0 08/19/2022   loperamide (IMODIUM) 2 MG capsule Take 2 mg by mouth daily as needed for diarrhea or loose stools.   08/19/2022   loratadine (CLARITIN) 10 MG tablet TAKE 1 TABLET BY MOUTH EVERY DAY 90 tablet 1 08/19/2022   Multiple Vitamin (MULTIVITAMIN) tablet Take 1 tablet by mouth daily.   08/19/2022   OLANZapine (ZYPREXA) 5 MG tablet Take 1 tablet (5 mg total) by mouth at bedtime. 90 tablet 0 08/19/2022   rosuvastatin (CRESTOR) 10 MG tablet TAKE 1 TABLET BY MOUTH EVERY DAY 90 tablet 0 08/19/2022   Vitamin D, Cholecalciferol, 25 MCG (1000 UT) TABS Take 1,000 tablets by mouth daily.    08/19/2022   allopurinol (ZYLOPRIM) 100 MG tablet Take 100 mg by mouth 2 (two) times daily.       Patient Stressors: Medication change or noncompliance    Patient Strengths: Ability for insight  Average or above average intelligence  Capable of independent living  Communication skills  Motivation for treatment/growth  Supportive family/friends   Treatment Modalities: Medication Management, Group therapy, Case management,  1 to 1 session with clinician, Psychoeducation, Recreational therapy.   Physician Treatment Plan for Primary Diagnosis: Bipolar depression (HCC) Long Term Goal(s): Improvement in symptoms so as ready for discharge   Short Term Goals: Compliance with prescribed medications will improve Ability to verbalize feelings will improve  Medication Management: Evaluate patient's response, side effects, and tolerance of medication regimen.  Therapeutic Interventions: 1 to 1 sessions, Unit Group sessions and Medication administration.  Evaluation of Outcomes: Progressing  Physician Treatment Plan for Secondary Diagnosis: Principal Problem:   Bipolar depression (HCC)  Long Term Goal(s): Improvement in symptoms so as ready for discharge   Short Term Goals: Compliance with prescribed medications will improve Ability to verbalize  feelings will improve     Medication Management: Evaluate patient's response, side effects, and tolerance of medication regimen.  Therapeutic Interventions: 1 to 1 sessions, Unit Group sessions and Medication administration.  Evaluation of Outcomes: Progressing   RN Treatment Plan for Primary Diagnosis: Bipolar depression (HCC) Long Term Goal(s): Knowledge of disease and therapeutic regimen to maintain health will improve  Short Term Goals: Ability to remain free from injury will improve, Ability to verbalize frustration and anger appropriately will improve, Ability to demonstrate self-control, Ability to participate in decision making will improve, Ability to  verbalize feelings will improve, Ability to disclose and discuss suicidal ideas, Ability to identify and develop effective coping behaviors will improve, and Compliance with prescribed medications will improve  Medication Management: RN will administer medications as ordered by provider, will assess and evaluate patient's response and provide education to patient for prescribed medication. RN will report any adverse and/or side effects to prescribing provider.  Therapeutic Interventions: 1 on 1 counseling sessions, Psychoeducation, Medication administration, Evaluate responses to treatment, Monitor vital signs and CBGs as ordered, Perform/monitor CIWA, COWS, AIMS and Fall Risk screenings as ordered, Perform wound care treatments as ordered.  Evaluation of Outcomes: Progressing   LCSW Treatment Plan for Primary Diagnosis: Bipolar depression (HCC) Long Term Goal(s): Safe transition to appropriate next level of care at discharge, Engage patient in therapeutic group addressing interpersonal concerns.  Short Term Goals: Engage patient in aftercare planning with referrals and resources, Increase social support, Increase ability to appropriately verbalize feelings, Increase emotional regulation, Facilitate acceptance of mental health diagnosis  and concerns, Facilitate patient progression through stages of change regarding substance use diagnoses and concerns, Identify triggers associated with mental health/substance abuse issues, and Increase skills for wellness and recovery  Therapeutic Interventions: Assess for all discharge needs, 1 to 1 time with Social worker, Explore available resources and support systems, Assess for adequacy in community support network, Educate family and significant other(s) on suicide prevention, Complete Psychosocial Assessment, Interpersonal group therapy.  Evaluation of Outcomes: Progressing   Progress in Treatment: Attending groups: Yes. Participating in groups: Yes. Taking medication as prescribed: Yes. Toleration medication: Yes. Family/Significant other contact made: Yes, individual(s) contacted:  Taraji Iannucci, son, [phone] Patient understands diagnosis: Yes. Discussing patient identified problems/goals with staff: Yes. Medical problems stabilized or resolved: Yes. Denies suicidal/homicidal ideation: No. Issues/concerns per patient self-inventory: Yes. Other: none  New problem(s) identified: No, Describe:  none  New Short Term/Long Term Goal(s): Patient to work towards medication management for mood stabilization; elimination of SI thoughts; development of comprehensive mental wellness plan.   Patient Goals:   No additional goals identified at this time. Patient to continue to work towards original goals identified in initial treatment team meeting. CSW will remain available to patient should they voice additional treatment goals.   Discharge Plan or Barriers: No psychosocial barriers identified at this time, patient to return to place of residence when appropriate for discharge.   Reason for Continuation of Hospitalization: Depression Medication stabilization  Estimated Length of Stay: 1-7 days   Scribe for Treatment Team: Almedia Balls 08/23/2022 8:33 AM

## 2022-08-24 DIAGNOSIS — N2581 Secondary hyperparathyroidism of renal origin: Secondary | ICD-10-CM | POA: Diagnosis not present

## 2022-08-24 DIAGNOSIS — F319 Bipolar disorder, unspecified: Secondary | ICD-10-CM | POA: Diagnosis not present

## 2022-08-24 DIAGNOSIS — D631 Anemia in chronic kidney disease: Secondary | ICD-10-CM | POA: Diagnosis not present

## 2022-08-24 DIAGNOSIS — I120 Hypertensive chronic kidney disease with stage 5 chronic kidney disease or end stage renal disease: Secondary | ICD-10-CM | POA: Diagnosis not present

## 2022-08-24 DIAGNOSIS — N186 End stage renal disease: Secondary | ICD-10-CM | POA: Diagnosis not present

## 2022-08-24 LAB — RENAL FUNCTION PANEL
Albumin: 3.9 g/dL (ref 3.5–5.0)
Anion gap: 13 (ref 5–15)
BUN: 95 mg/dL — ABNORMAL HIGH (ref 8–23)
CO2: 24 mmol/L (ref 22–32)
Calcium: 9.6 mg/dL (ref 8.9–10.3)
Chloride: 101 mmol/L (ref 98–111)
Creatinine, Ser: 7.08 mg/dL — ABNORMAL HIGH (ref 0.61–1.24)
GFR, Estimated: 7 mL/min — ABNORMAL LOW (ref >60)
Glucose, Bld: 101 mg/dL — ABNORMAL HIGH (ref 70–99)
Phosphorus: 4.2 mg/dL (ref 2.5–4.6)
Potassium: 4.3 mmol/L (ref 3.5–5.1)
Sodium: 138 mmol/L (ref 135–145)

## 2022-08-24 LAB — CBC
HCT: 26.7 % — ABNORMAL LOW (ref 39.0–52.0)
Hemoglobin: 9.6 g/dL — ABNORMAL LOW (ref 13.0–17.0)
MCH: 35 pg — ABNORMAL HIGH (ref 26.0–34.0)
MCHC: 36 g/dL (ref 30.0–36.0)
MCV: 97.4 fL (ref 80.0–100.0)
Platelets: 108 10*3/uL — ABNORMAL LOW (ref 150–400)
RBC: 2.74 MIL/uL — ABNORMAL LOW (ref 4.22–5.81)
RDW: 12.3 % (ref 11.5–15.5)
WBC: 4.6 10*3/uL (ref 4.0–10.5)
nRBC: 0 % (ref 0.0–0.2)

## 2022-08-24 MED ORDER — PENTAFLUOROPROP-TETRAFLUOROETH EX AERO
INHALATION_SPRAY | CUTANEOUS | Status: AC
  Filled 2022-08-24: qty 30

## 2022-08-24 MED ORDER — LAMOTRIGINE 25 MG PO TABS
25.0000 mg | ORAL_TABLET | ORAL | Status: DC
  Administered 2022-08-24 – 2022-09-25 (×58): 25 mg via ORAL
  Filled 2022-08-24 (×59): qty 1

## 2022-08-24 MED ORDER — SENNOSIDES-DOCUSATE SODIUM 8.6-50 MG PO TABS
2.0000 | ORAL_TABLET | Freq: Every day | ORAL | Status: DC
  Administered 2022-08-24 – 2022-09-25 (×32): 2 via ORAL
  Filled 2022-08-24 (×33): qty 2

## 2022-08-24 MED ORDER — DOXEPIN HCL 50 MG PO CAPS
150.0000 mg | ORAL_CAPSULE | Freq: Every day | ORAL | Status: DC
  Administered 2022-08-24 – 2022-08-29 (×6): 150 mg via ORAL
  Filled 2022-08-24 (×7): qty 3

## 2022-08-24 NOTE — Progress Notes (Signed)
   08/24/22 0600  15 Minute Checks  Location Bedroom  Visual Appearance Calm  Behavior Composed  Sleep (Behavioral Health Patients Only)  Calculate sleep? (Click Yes once per 24 hr at 0600 safety check) Yes  Documented sleep last 24 hours 7

## 2022-08-24 NOTE — Progress Notes (Signed)
[facility]  ROUNDING NOTE   Subjective:   Patient seen and evaluated during dialysis   HEMODIALYSIS FLOWSHEET:  Blood Flow Rate (mL/min): 400 mL/min Arterial Pressure (mmHg): -250 mmHg Venous Pressure (mmHg): 270 mmHg TMP (mmHg): 18 mmHg Ultrafiltration Rate (mL/min): 543 mL/min Dialysate Flow Rate (mL/min): 300 ml/min Dialysis Fluid Bolus: Normal Saline Bolus Amount (mL): 100 mL  Tolerating treatment well seated in recliner States he is beginning to feel better   Objective:  Vital signs in last 24 hours:  Temp:  [98.1 F (36.7 C)-98.6 F (37 C)] 98.1 F (36.7 C) (06/04 0816) Pulse Rate:  [62-84] 65 (06/04 1030) Resp:  [11-18] 18 (06/04 1030) BP: (94-192)/(56-95) 100/68 (06/04 1030) SpO2:  [88 %-100 %] 100 % (06/04 1030) Weight:  [82.6 kg] 82.6 kg (06/04 0816)  Weight change:  Filed Weights   08/20/22 1438 08/20/22 1841 08/24/22 0816  Weight: 81.4 kg 80.8 kg 82.6 kg    Intake/Output: I/O last 3 completed shifts: In: 920 [P.O.:720; I.V.:200] Out: 1400 [Urine:1400]   Intake/Output this shift:  No intake/output data recorded.  Physical Exam: General: NAD, seated in chair  Head: Normocephalic, atraumatic. Moist oral mucosal membranes  Eyes: Anicteric  Lungs:  Clear to auscultation  Heart: Sinus bradycardia  Abdomen:  Soft, nontender  Extremities:  no peripheral edema.  Neurologic: Nonfocal, moving all four extremities  Skin: No lesions  Access: Left AVG    Basic Metabolic Panel: Recent Labs  Lab 08/18/22 1654 08/19/22 0917 08/21/22 0942 08/24/22 0724  NA 132* 134* 135 138  K 4.0 3.6 4.1 4.3  CL 95* 96* 97* 101  CO2 28 25 28 24   GLUCOSE 155* 178* 161* 101*  BUN 60* 78* 64* 95*  CREATININE 5.30* 6.22* 5.24* 7.08*  CALCIUM 9.2 9.0 9.1 9.6  PHOS 3.8 4.1 4.0 4.2     Liver Function Tests: Recent Labs  Lab 08/18/22 1654 08/19/22 0917 08/21/22 0942 08/24/22 0724  ALBUMIN 4.3 3.7 3.7 3.9    No results for input(s): "LIPASE",  "AMYLASE" in the last 168 hours. No results for input(s): "AMMONIA" in the last 168 hours.  CBC: Recent Labs  Lab 08/18/22 1654 08/19/22 0922 08/21/22 0942 08/24/22 0724  WBC 4.5 3.5* 3.8* 4.6  HGB 10.4* 9.4* 9.4* 9.6*  HCT 28.9* 26.0* 26.9* 26.7*  MCV 96.7 97.0 98.5 97.4  PLT 131* 108* 108* 108*     Cardiac Enzymes: No results for input(s): "CKTOTAL", "CKMB", "CKMBINDEX", "TROPONINI" in the last 168 hours.  BNP: Invalid input(s): "POCBNP"  CBG: No results for input(s): "GLUCAP" in the last 168 hours.   Microbiology: Results for orders placed or performed in visit on 05/12/22  Microscopic Examination     Status: Abnormal   Collection Time: 05/12/22 10:39 AM   Urine  Result Value Ref Range Status   WBC, UA 6-10 (A) 0 - 5 /hpf Final   RBC, Urine 0-2 0 - 2 /hpf Final   Epithelial Cells (non renal) 0-10 0 - 10 /hpf Final   Bacteria, UA Moderate (A) None seen/Few Final    Coagulation Studies: No results for input(s): "LABPROT", "INR" in the last 72 hours.  Urinalysis: No results for input(s): "COLORURINE", "LABSPEC", "PHURINE", "GLUCOSEU", "HGBUR", "BILIRUBINUR", "KETONESUR", "PROTEINUR", "UROBILINOGEN", "NITRITE", "LEUKOCYTESUR" in the last 72 hours.  Invalid input(s): "APPERANCEUR"    Imaging: No results found.   Medications:      allopurinol  100 mg Oral BID   ARIPiprazole  15 mg Oral QPC breakfast   benazepril  10  mg Oral QPM   buPROPion  150 mg Oral Daily   cholecalciferol  1,000 Units Oral Daily   doxepin  125 mg Oral QHS   feeding supplement (NEPRO CARB STEADY)  237 mL Oral Q24H   loratadine  10 mg Oral Daily   potassium chloride  10 mEq Oral Daily   rosuvastatin  10 mg Oral Daily   acetaminophen, alteplase, alum & mag hydroxide-simeth, droperidol, fentaNYL (SUBLIMAZE) injection, fluticasone, heparin, lidocaine (PF), lidocaine-prilocaine, ondansetron (ZOFRAN) IV, oxyCODONE **OR** oxyCODONE, pentafluoroprop-tetrafluoroeth, polyethylene glycol,  promethazine, traZODone  Assessment/ Plan:  [name]. is a 77 y.o. white male with end stage renal disease on hemodialysis, hypertension, gout, depression, history of melanoma, crohn's, and CVA who presents to [facility] on 07/30/2022 for Bipolar depression (HCC) [F31.9]  [facility] TTS [address] Left AVG 87kg  End Stage Renal Disease on hemodialysis:  Receiving scheduled treatment today, UF goal 1L as tolerated.  Next treatment scheduled for Thursday  Anemia with chronic kidney disease: Hgb 9.6, within desired range.  No need for ESA's at this time.  Hypertension with chronic kidney disease: Hypotensive on treatment today. Current regimen of amlodipine, benazepril. Patient did not get his medications this morning prior to dialysis. -Currently prescribed benazepril daily, evening dosing - Blood pressure stable during dialysis  Secondary Hyperparathyroidism: outpatient PTH of 456.  Bone minerals remain acceptable.   LOS: 25   6/4/202411:00 AM

## 2022-08-24 NOTE — Progress Notes (Signed)
  Received patient in bed to unit.   Informed consent signed and in chart.    TX duration:3.5hr     Transported back to floor   Hand-off given to patient's nurse.  No c/o and no distress noted    Access used: LAVG Access issues: none   Total UF removed: 1.0L Medication(s) given: none Post HD VS: 126/67 Post HD weight: 81.3kg     Lynann Beaver  [facility]

## 2022-08-24 NOTE — Progress Notes (Signed)
Patient scheduled for dialysis today and received full treatment per nurses' report. Tolerated procedure well. Arrived unit in stable condition and eating lunch.

## 2022-08-24 NOTE — Group Note (Signed)
LCSW Group Therapy Note   Group Date: 08/24/2022 Start Time: 1315 End Time: 1400  Type of Therapy and Topic: Group Therapy: Social Supports  Participation Level: [facility] PARTICIPATION LEVEL: Minimal  Description of Group: In this group, patients will be encouraged to explore what supports means to them.  Patients were asked and encouraged to identify what support means to them as well as the benefits of having a support system.  Each patient will be challenged to think of who their support system consists of and how that support has improved their quality of life.  This group will be process-oriented, with patients participating in exploration of their own experiences as well as giving and receiving support and challenge from other group members.  Therapeutic Goals: Patient will identify personal support system.  Patient will identify benefits of support systems.   Patient will identify types of support systems.   Patient will discuss ways that their support system has provided a positive impact on their wellbeing.     Summary of Patient Progress: Patient was present in group for it's entirety but had limited participation due to finishing his lunch after dialysis.  Patient was preoccupied with discussing his bill and how much his insurance would cover.  CSW attempted to redirect patient, however was somewhat unsuccessful.    Therapeutic Modalities: Cognitive Behavioral Therapy Solution Focused Therapy Motivational Interviewing  Harden Mo, [place] 08/24/2022  2:15 PM
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
## 2022-08-24 NOTE — Group Note (Signed)
Date:  08/24/2022 Time:  10:04 AM  Group Topic/Focus:  Mindfulness-Allows patients to be mindful about living present  and not dwelling on the past.     Participation Level:  Did Not Attend  Participation Quality:    Affect:    Cognitive:    Insight:   Engagement in Group:    Modes of Intervention:    Additional Comments:  Did not attend   Luis Miller 08/24/2022, 10:04 AM

## 2022-08-24 NOTE — Group Note (Signed)
Date:  08/24/2022 Time:  7:11 AM  Group Topic/Focus:  Making Healthy Choices:   The focus of this group is to help patients identify negative/unhealthy choices they were using prior to admission and identify positive/healthier coping strategies to replace them upon discharge.    Participation Level:  Active  Participation Quality:  Appropriate  Affect:  Appropriate  Cognitive:  Alert  Insight: Good  Engagement in Group:  Developing/Improving  Modes of Intervention:  Education  Additional Comments:    Garry Heater 08/24/2022, 7:11 AM

## 2022-08-24 NOTE — Group Note (Signed)
Recreation Therapy Group Note   Group Topic:Health and Wellness  Group Date: 08/24/2022 Start Time: 1400 End Time: 1445 Facilitators: Rosina Lowenstein, LRT, CTRS Location:  Dayroom  Group Description: Seated Exercise. LRT discussed the mental and physical benefits of exercise. LRT and group discussed how physical activity can be used as a coping skill. Pt's and LRT followed along to an exercise video on the TV screen that provided a visual representation and audio description of every exercise performed. Pt's encouraged to listen to their bodies and stop at any time if they experience feelings of discomfort or pain. LRT passed out water after session was over and encouraged pts do drink and stay hydrated.  Goal Area(s) Addressed: Patient will learn benefits of physical activity. Patient will identify exercise as a coping skill.  Patient will follow multistep directions. Patient will try a new leisure interest.   Affect/Mood: Appropriate   Participation Level: Active and Engaged   Participation Quality: Independent   Behavior: Calm and Cooperative   Speech/Thought Process: Coherent   Insight: Good   Judgement: Good   Modes of Intervention: Activity   Patient Response to Interventions:  Attentive, Engaged, Interested , and Receptive   Education Outcome:  Acknowledges education   Clinical Observations/Individualized Feedback: Luis Miller was active in their participation of session activities and group discussion. Pt identified "I use to walk a lot" as a form of exercise outside of the hospital. Pt completed all of the shown exercises appropriately. Pt interacted well with LRT and peers duration of session.   Plan: Continue to engage patient in RT group sessions 2-3x/week.   Rosina Lowenstein, LRT, CTRS 08/24/2022 3:33 PM

## 2022-08-24 NOTE — Group Note (Signed)
Date:  08/24/2022 Time:  9:15 PM  Group Topic/Focus:  Rediscovering Joy:   The focus of this group is to explore various ways to relieve stress in a positive manner as well as the state of mindfulness.     Participation Level:  Active  Participation Quality:  Sharing  Affect:  Appropriate  Cognitive:  Appropriate  Insight: Improving  Engagement in Group:  Engaged  Modes of Intervention:  Activity and Discussion  Additional Comments:    ZUBEYR PERILLO 08/24/2022, 9:15 PM

## 2022-08-24 NOTE — Progress Notes (Signed)
   08/24/22 1300  Psych Admission Type (Psych Patients Only)  Admission Status Voluntary  Psychosocial Assessment  Patient Complaints Depression  Eye Contact Fair  Facial Expression Flat  Affect Flat  Speech Logical/coherent  Interaction Assertive  Motor Activity Slow  Appearance/Hygiene Unremarkable  Behavior Characteristics Cooperative;Calm  Mood Sad  Thought Process  Coherency WDL  Content WDL  Delusions None reported or observed  Perception WDL  Hallucination None reported or observed  Judgment WDL  Confusion None  Danger to Self  Current suicidal ideation? Denies  Agreement Not to Harm Self Yes  Danger to Others  Danger to Others None reported or observed   Patient appears with a flat affect. Denies SI/HI/AVH. Tolerated all meals and medications. Thrill and bruitt to left arm. No bleeding noted. Patient reported constipation and stated he was given miralax yesterday with no result. New order to administer Senna. No results noted as per time of this note. Attends group, watches television and interacts with peers today. Will continue to monitor.

## 2022-08-25 ENCOUNTER — Inpatient Hospital Stay: Payer: Medicare Other | Admitting: Certified Registered Nurse Anesthetist

## 2022-08-25 DIAGNOSIS — F319 Bipolar disorder, unspecified: Secondary | ICD-10-CM | POA: Diagnosis not present

## 2022-08-25 NOTE — Group Note (Signed)
Recreation Therapy Group Note   Group Topic:Problem Solving  Group Date: 08/25/2022 Start Time: 1400 End Time: 1455 Facilitators: Rosina Lowenstein, LRT, CTRS Location:  Craft Room  Group Description: Life Boat. Patients were given the scenario that they are on a boat that is about to become shipwrecked, leaving them stranded on an [place]. They are asked to make a list of 15 different items that they want to take with them when they are stranded on the [place]. Patients are asked to rank their items from most important to least important, #1 being the most important and #15 being the least. Patients will work individually for the first round to come up with 15 items and then pair up with a peer(s) to condense their list and come up with one list of 15 items between the two of them. Patients or LRT will read aloud the 15 different items to the group after each round. LRT facilitated post-activity processing to discuss how this activity can be used in daily life post discharge.   Goal Area(s) Addressed:  Patient will identify priorities, wants and needs. Patient will communicate with LRT and peers. Patient will work collectively as a Administrator, [employer] Service. Patient will work on Product manager.   Affect/Mood: Appropriate   Participation Level: Active and Engaged   Participation Quality: Independent   Behavior: Calm and Cooperative   Speech/Thought Process: Coherent   Insight: Good   Judgement: Good   Modes of Intervention: Activity   Patient Response to Interventions:  Attentive, Engaged, Interested , and Receptive   Education Outcome:  Acknowledges education   Clinical Observations/Individualized Feedback: [name] was active in their participation of session activities and group discussion. Pt identified "radio, water, clothes, saw, shoes, a friend, lotion, glasses, mirror and a fishing pole" as some of the items he will bring with him on [address]. Pt interacted well with LRT and peers  duration of session.    Plan: Continue to engage patient in RT group sessions 2-3x/week.   Rosina Lowenstein, LRT, CTRS 08/25/2022 4:00 PM

## 2022-08-25 NOTE — Group Note (Signed)
[facility] LCSW Group Therapy Note   Group Date: 08/25/2022 Start Time: 1315 End Time: 1400   Type of Therapy/Topic:  Group Therapy:  Emotion Regulation  Participation Level:  Active   Mood:  Description of Group:    The purpose of this group is to assist patients in learning to regulate negative emotions and experience positive emotions. Patients will be guided to discuss ways in which they have been vulnerable to their negative emotions. These vulnerabilities will be juxtaposed with experiences of positive emotions or situations, and patients challenged to use positive emotions to combat negative ones. Special emphasis will be placed on coping with negative emotions in conflict situations, and patients will process healthy conflict resolution skills.  Therapeutic Goals: Patient will identify two positive emotions or experiences to reflect on in order to balance out negative emotions:  Patient will label two or more emotions that they find the most difficult to experience:  Patient will be able to demonstrate positive conflict resolution skills through discussion or role plays:   Summary of Patient Progress: Patient was present in group.  Patient was attentive and engaged.  Patient was supportive of other group members.  Patient responded appropriately to the questions posed.    Therapeutic Modalities:   Cognitive Behavioral Therapy Feelings Identification Dialectical Behavioral Therapy   Harden Mo, LCSW

## 2022-08-25 NOTE — Plan of Care (Signed)
AAOX3. Calm and cooperative. Reports feeling less sad and depress. States he's no longer interested in ECT says its not working and will not be going to treatment in the morning. Support and encouragement provided.  Pt remain NPO after midnight. I/O remains in progress. No behavior issues noted. Denies SI/HI/AV/H. Q 15 min checks maintained for safety. No c/o pain/discomfort noted. Plan of care continued.   Problem: Activity: Goal: Risk for activity intolerance will decrease Outcome: Progressing   Problem: Nutrition: Goal: Adequate nutrition will be maintained Outcome: Progressing   Problem: Coping: Goal: Level of anxiety will decrease Outcome: Progressing   Problem: Elimination: Goal: Will not experience complications related to bowel motility Outcome: Progressing Goal: Will not experience complications related to urinary retention Outcome: Progressing   Problem: Pain Managment: Goal: General experience of comfort will improve Outcome: Progressing   Problem: Safety: Goal: Ability to remain free from injury will improve Outcome: Progressing

## 2022-08-25 NOTE — Group Note (Signed)
Date:  08/25/2022 Time:  10:23 PM  Group Topic/Focus:  Coping With Mental Health Crisis:   The purpose of this group is to help patients identify strategies for coping with mental health crisis.  Group discusses possible causes of crisis and ways to manage them effectively.    Participation Level:  Active  Participation Quality:  Appropriate  Affect:  Appropriate  Cognitive:  Appropriate  Insight: Good  Engagement in Group:  Engaged  Modes of Intervention:  Education  Additional Comments:    Garry Heater 08/25/2022, 10:23 PM

## 2022-08-25 NOTE — Progress Notes (Signed)
   08/25/22 1100  Psych Admission Type (Psych Patients Only)  Admission Status Voluntary  Psychosocial Assessment  Patient Complaints Depression  Eye Contact Fair  Facial Expression Flat  Affect Flat  Speech Logical/coherent  Interaction Assertive  Motor Activity Slow  Appearance/Hygiene Unremarkable  Behavior Characteristics Cooperative  Mood Sad  Thought Process  Coherency WDL  Content WDL  Delusions None reported or observed  Perception WDL  Hallucination None reported or observed  Judgment WDL  Confusion None  Danger to Self  Current suicidal ideation? Denies  Agreement Not to Harm Self Yes  Danger to Others  Danger to Others None reported or observed   Patient refused ECT today. Denies SI/HI but endorses depression. Spent time in dayroom with peer interaction. Attended all groups. Tolerated all meals and medications.

## 2022-08-25 NOTE — Anesthesia Preprocedure Evaluation (Addendum)
Anesthesia Evaluation    Airway        Dental   Pulmonary sleep apnea , former smoker (quit 1974)          Cardiovascular hypertension,   ECG 08/10/22:  normal   Neuro/Psych  PSYCHIATRIC DISORDERS Anxiety Depression Bipolar Disorder   HOH CVA (left eye vision loss)    GI/Hepatic PUD,,,Crohn disease   Endo/Other  diabetes, Type 2    Renal/GU ESRF and DialysisRenal disease     Musculoskeletal Gout    Abdominal   Peds  Hematology  (+) Blood dyscrasia, anemia   Anesthesia Other Findings   Reproductive/Obstetrics                             Anesthesia Physical Anesthesia Plan  ASA: 3  Anesthesia Plan: General   Post-op Pain Management:    Induction: Intravenous  PONV Risk Score and Plan: 2 and TIVA and Treatment may vary due to age or medical condition  Airway Management Planned: Natural Airway  Additional Equipment:   Intra-op Plan:   Post-operative Plan:   Informed Consent:   Plan Discussed with:   Anesthesia Plan Comments:         Anesthesia Quick Evaluation

## 2022-08-25 NOTE — Progress Notes (Signed)
   08/25/22 2000  Psych Admission Type (Psych Patients Only)  Admission Status Voluntary  Psychosocial Assessment  Patient Complaints Depression;Hopelessness  Eye Contact Fair  Facial Expression Flat  Affect Flat  Speech Logical/coherent  Interaction Assertive  Motor Activity Slow  Appearance/Hygiene Unremarkable  Behavior Characteristics Cooperative;Appropriate to situation  Mood Sad  Thought Process  Coherency WDL  Content WDL  Delusions None reported or observed  Perception WDL  Hallucination None reported or observed  Judgment WDL  Confusion None  Danger to Self  Current suicidal ideation? Denies  Agreement Not to Harm Self Yes  Description of Agreement Verbal  Danger to Others  Danger to Others None reported or observed

## 2022-08-25 NOTE — Group Note (Signed)
Date:  08/25/2022 Time:  6:46 PM  Group Topic/Focus:  Personal Choices and Values:   The focus of this group is to help patients assess and explore the importance of values in their lives, how their values affect their decisions, how they express their values and what opposes their expression.    Participation Level:  Active  Participation Quality:  Appropriate  Affect:  Appropriate  Cognitive:  Alert and Appropriate  Insight: Appropriate, Good, and Improving  Engagement in Group:  Engaged and Improving  Modes of Intervention:  Activity and Discussion  Additional Comments:    Doug Sou 08/25/2022, 6:46 PM
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
## 2022-08-26 DIAGNOSIS — F319 Bipolar disorder, unspecified: Secondary | ICD-10-CM | POA: Diagnosis not present

## 2022-08-26 DIAGNOSIS — I120 Hypertensive chronic kidney disease with stage 5 chronic kidney disease or end stage renal disease: Secondary | ICD-10-CM | POA: Diagnosis not present

## 2022-08-26 DIAGNOSIS — D631 Anemia in chronic kidney disease: Secondary | ICD-10-CM | POA: Diagnosis not present

## 2022-08-26 DIAGNOSIS — N2581 Secondary hyperparathyroidism of renal origin: Secondary | ICD-10-CM | POA: Diagnosis not present

## 2022-08-26 DIAGNOSIS — N186 End stage renal disease: Secondary | ICD-10-CM | POA: Diagnosis not present

## 2022-08-26 LAB — RENAL FUNCTION PANEL
Albumin: 3.7 g/dL (ref 3.5–5.0)
Anion gap: 12 (ref 5–15)
BUN: 77 mg/dL — ABNORMAL HIGH (ref 8–23)
CO2: 25 mmol/L (ref 22–32)
Calcium: 9.3 mg/dL (ref 8.9–10.3)
Chloride: 99 mmol/L (ref 98–111)
Creatinine, Ser: 5.7 mg/dL — ABNORMAL HIGH (ref 0.61–1.24)
GFR, Estimated: 10 mL/min — ABNORMAL LOW (ref >60)
Glucose, Bld: 105 mg/dL — ABNORMAL HIGH (ref 70–99)
Phosphorus: 3.9 mg/dL (ref 2.5–4.6)
Potassium: 3.8 mmol/L (ref 3.5–5.1)
Sodium: 136 mmol/L (ref 135–145)

## 2022-08-26 LAB — CBC
HCT: 25.2 % — ABNORMAL LOW (ref 39.0–52.0)
Hemoglobin: 9.1 g/dL — ABNORMAL LOW (ref 13.0–17.0)
MCH: 35.4 pg — ABNORMAL HIGH (ref 26.0–34.0)
MCHC: 36.1 g/dL — ABNORMAL HIGH (ref 30.0–36.0)
MCV: 98.1 fL (ref 80.0–100.0)
Platelets: 103 10*3/uL — ABNORMAL LOW (ref 150–400)
RBC: 2.57 MIL/uL — ABNORMAL LOW (ref 4.22–5.81)
RDW: 12.3 % (ref 11.5–15.5)
WBC: 3.7 10*3/uL — ABNORMAL LOW (ref 4.0–10.5)
nRBC: 0 % (ref 0.0–0.2)

## 2022-08-26 LAB — HEPATITIS B SURFACE ANTIGEN: Hepatitis B Surface Ag: NONREACTIVE

## 2022-08-26 NOTE — Progress Notes (Signed)
   08/26/22 0601  15 Minute Checks  Location Bedroom  Visual Appearance Calm  Behavior Sleeping  Sleep (Behavioral Health Patients Only)  Calculate sleep? (Click Yes once per 24 hr at 0600 safety check) Yes  Documented sleep last 24 hours 7.75

## 2022-08-26 NOTE — Progress Notes (Addendum)
I assumed care for [name] at about 07:00.  He was resting in the day area watching TV with peers, left the unit for dialysis at about 07:45.  Pt returned from dialysis at about 11:50, alert and oriented X4, calm, reports feeling tired post dialysis, lunch provided, he ate 100%. Vital signs at patients baseline, dialyis shunt on left upper arm intact, bruit and thrill +, no signs of infection at site, dressing intact. Pt otherwise denies avh/hi/si, still endorses depression at baseline. Fluid intake recorded thus far 240 cc, urinary output 1200 cc. He is being monitored as ordered.    No changes in condition from baseline,vital signs wnl,no falls, total intake 480 cc recorded,output 1200 cc. He is being monitored as ordered.

## 2022-08-26 NOTE — Progress Notes (Signed)
Hemodialysis note  Received patient in bed to unit. Alert and oriented.  Informed consent signed and in chart.  Treatment initiated: 0750 Treatment completed: 1124  Patient tolerated well. Transported back to room, alert without acute distress.  Report given to patient's RN.   Access used: LUA AVG Access issues: none  Total UF removed: 1L Medication(s) given:  none  Post HD weight: 81.1 kg   Wolfgang Phoenix [facility]

## 2022-08-26 NOTE — Group Note (Signed)
[facility] LCSW Group Therapy Note   Group Date: 08/26/2022 Start Time: 1300 End Time: 1400   Type of Therapy/Topic:  Group Therapy:  Balance in Life  Participation Level:  Active   Description of Group:    This group will address the concept of balance and how it feels and looks when one is unbalanced. Patients will be encouraged to process areas in their lives that are out of balance, and identify reasons for remaining unbalanced. Facilitators will guide patients utilizing problem- solving interventions to address and correct the stressor making their life unbalanced. Understanding and applying boundaries will be explored and addressed for obtaining  and maintaining a balanced life. Patients will be encouraged to explore ways to assertively make their unbalanced needs known to significant others in their lives, using other group members and facilitator for support and feedback.  Therapeutic Goals: Patient will identify two or more emotions or situations they have that consume much of in their lives. Patient will identify signs/triggers that life has become out of balance:  Patient will identify two ways to set boundaries in order to achieve balance in their lives:  Patient will demonstrate ability to communicate their needs through discussion and/or role plays  Summary of Patient Progress: Patient was present in group.  Patient was active and engaged in group.  Patient was supportive of other group members. Patient was able to engage in discussion on what balance looks like for him as well as the importance of having balance with the different parts of life.  Patient was appropriate and insightful. Patient disposition was improved than previous groups.    Therapeutic Modalities:   Cognitive Behavioral Therapy Solution-Focused Therapy Assertiveness Training   Harden Mo, LCSW
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
## 2022-08-26 NOTE — Progress Notes (Signed)
[facility]  ROUNDING NOTE   Subjective:   Patient seen and evaluated during dialysis   HEMODIALYSIS FLOWSHEET:  Blood Flow Rate (mL/min): 400 mL/min Arterial Pressure (mmHg): -240 mmHg Venous Pressure (mmHg): 240 mmHg TMP (mmHg): 19 mmHg Ultrafiltration Rate (mL/min): 555 mL/min Dialysate Flow Rate (mL/min): 300 ml/min Dialysis Fluid Bolus: Normal Saline Bolus Amount (mL): 100 mL  States he feels the same, has not improved.    Objective:  Vital signs in last 24 hours:  Temp:  [97.6 F (36.4 C)-99 F (37.2 C)] 97.6 F (36.4 C) (06/06 0737) Pulse Rate:  [71-102] 72 (06/06 1124) Resp:  [14-21] 15 (06/06 1124) BP: (90-140)/(59-80) 113/70 (06/06 1124) SpO2:  [97 %-100 %] 100 % (06/06 1124) Weight:  [81.6 kg] 81.6 kg (06/06 0737)  Weight change:  Filed Weights   08/24/22 0816 08/24/22 1302 08/26/22 0737  Weight: 82.6 kg 81.3 kg 81.6 kg    Intake/Output: I/O last 3 completed shifts: In: 190 [P.O.:190] Out: 100 [Urine:100]   Intake/Output this shift:  Total I/O In: -  Out: 1000 [Other:1000]  Physical Exam: General: NAD, seated in chair  Head: Normocephalic, atraumatic. Moist oral mucosal membranes  Eyes: Anicteric  Lungs:  Clear to auscultation  Heart: Sinus bradycardia  Abdomen:  Soft, nontender  Extremities:  no peripheral edema.  Neurologic: Nonfocal, moving all four extremities  Skin: No lesions  Access: Left AVG    Basic Metabolic Panel: Recent Labs  Lab 08/21/22 0942 08/24/22 0724 08/26/22 0800  NA 135 138 136  K 4.1 4.3 3.8  CL 97* 101 99  CO2 28 24 25   GLUCOSE 161* 101* 105*  BUN 64* 95* 77*  CREATININE 5.24* 7.08* 5.70*  CALCIUM 9.1 9.6 9.3  PHOS 4.0 4.2 3.9     Liver Function Tests: Recent Labs  Lab 08/21/22 0942 08/24/22 0724 08/26/22 0800  ALBUMIN 3.7 3.9 3.7    No results for input(s): "LIPASE", "AMYLASE" in the last 168 hours. No results for input(s): "AMMONIA" in the last 168 hours.  CBC: Recent Labs   Lab 08/21/22 0942 08/24/22 0724 08/26/22 0800  WBC 3.8* 4.6 3.7*  HGB 9.4* 9.6* 9.1*  HCT 26.9* 26.7* 25.2*  MCV 98.5 97.4 98.1  PLT 108* 108* 103*     Cardiac Enzymes: No results for input(s): "CKTOTAL", "CKMB", "CKMBINDEX", "TROPONINI" in the last 168 hours.  BNP: Invalid input(s): "POCBNP"  CBG: No results for input(s): "GLUCAP" in the last 168 hours.   Microbiology: Results for orders placed or performed in visit on 05/12/22  Microscopic Examination     Status: Abnormal   Collection Time: 05/12/22 10:39 AM   Urine  Result Value Ref Range Status   WBC, UA 6-10 (A) 0 - 5 /hpf Final   RBC, Urine 0-2 0 - 2 /hpf Final   Epithelial Cells (non renal) 0-10 0 - 10 /hpf Final   Bacteria, UA Moderate (A) None seen/Few Final    Coagulation Studies: No results for input(s): "LABPROT", "INR" in the last 72 hours.  Urinalysis: No results for input(s): "COLORURINE", "LABSPEC", "PHURINE", "GLUCOSEU", "HGBUR", "BILIRUBINUR", "KETONESUR", "PROTEINUR", "UROBILINOGEN", "NITRITE", "LEUKOCYTESUR" in the last 72 hours.  Invalid input(s): "APPERANCEUR"    Imaging: No results found.   Medications:      allopurinol  100 mg Oral BID   ARIPiprazole  15 mg Oral QPC breakfast   benazepril  10 mg Oral QPM   buPROPion  150 mg Oral Daily   cholecalciferol  1,000 Units Oral Daily   doxepin  150 mg Oral QHS   feeding supplement (NEPRO CARB STEADY)  237 mL Oral Q24H   lamoTRIgine  25 mg Oral [id number]   loratadine  10 mg Oral Daily   potassium chloride  10 mEq Oral Daily   rosuvastatin  10 mg Oral Daily   senna-docusate  2 tablet Oral QPC breakfast   acetaminophen, alteplase, alum & mag hydroxide-simeth, droperidol, fentaNYL (SUBLIMAZE) injection, fluticasone, heparin, lidocaine (PF), lidocaine-prilocaine, ondansetron (ZOFRAN) IV, oxyCODONE **OR** oxyCODONE, pentafluoroprop-tetrafluoroeth, polyethylene glycol, promethazine, traZODone  Assessment/ Plan:  [name]. is a  77 y.o. white male with end stage renal disease on hemodialysis, hypertension, gout, depression, history of melanoma, crohn's, and CVA who presents to [facility] on 07/30[facility]20[facility] for Bipolar depression (HCC) [F31.9]  [facility] [address] Left AVG 87kg  End Stage Renal Disease on hemodialysis:  Received dialysis today, UF 1L. Next treatment scheduled for Saturday.  Agreeable to liberalizing diet.   Anemia with chronic kidney disease: Hgb 9.6, within desired range.  No need for ESA's at this time.  Hypertension with chronic kidney disease: Hypotensive on treatment today. Current regimen of amlodipine, benazepril. Patient did not get his medications this morning prior to dialysis. - Blood pressure 102/66 during dialysis  Secondary Hyperparathyroidism: outpatient PTH of 456.   Will continue to monitor bone minerals during this admission.    LOS: 27   6/6/202412:04 PM

## 2022-08-27 DIAGNOSIS — F319 Bipolar disorder, unspecified: Secondary | ICD-10-CM | POA: Diagnosis not present

## 2022-08-27 NOTE — Progress Notes (Signed)
   08/27/22 0712  Psych Admission Type (Psych Patients Only)  Admission Status Voluntary  Psychosocial Assessment  Patient Complaints Depression  Eye Contact Brief  Facial Expression Blank;Sullen  Affect Sad;Sullen  Armed forces logistics/support/administrative officer Activity Slow  Appearance/Hygiene Unremarkable  Behavior Characteristics Cooperative  Mood Sad  Thought Process  Coherency WDL  Content WDL  Delusions None reported or observed  Perception WDL  Hallucination None reported or observed  Judgment WDL  Confusion None  Danger to Self  Current suicidal ideation? Denies  Danger to Others  Danger to Others None reported or observed
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
## 2022-08-27 NOTE — Plan of Care (Signed)
AAOX3. Calm and cooperative. Visible on the unit. Attends group. Endorses depression. Denies SI/HI/AV/H. Po med compliant. No PRNs given. Q 15 min checks maintained for safety. No c/o pain/discomfort noted. Plan of care continued.    Problem: Activity: Goal: Risk for activity intolerance will decrease Outcome: Progressing   Problem: Nutrition: Goal: Adequate nutrition will be maintained Outcome: Progressing   Problem: Coping: Goal: Level of anxiety will decrease Outcome: Progressing   Problem: Elimination: Goal: Will not experience complications related to bowel motility Outcome: Progressing Goal: Will not experience complications related to urinary retention Outcome: Progressing   Problem: Pain Managment: Goal: General experience of comfort will improve Outcome: Progressing   Problem: Safety: Goal: Ability to remain free from injury will improve Outcome: Progressing

## 2022-08-27 NOTE — Group Note (Signed)
Recreation Therapy Group Note   Group Topic:Healthy Support Systems  Group Date: 08/27/2022 Start Time: 1400 End Time: 1440 Facilitators: Rosina Lowenstein, LRT, CTRS Location:  Dayroom  Group Description: Straw Bridge. In groups of 3, patients were given 10 plastic drinking straws and an equal length of masking tape. Using the materials provided, in pairs patients were instructed to build a free-standing bridge-like structure to suspend an everyday item (ex: deck of cards) off the floor or table surface. All materials were required to be used in Secondary school teacher. LRT facilitated post-activity discussion reviewing the importance of having strong and healthy support systems in our lives. LRT discussed how the people in our lives serve as the tape and the book we placed on top of our straw structure are the stressors we face in daily life. LRT and pts discussed what happens in our life when things get too heavy for [place], and we don't have strong supports outside of the hospital. Pt shared 2 of their healthy supports aloud in the group.   Goal Area(s) Addressed:  Patient will identify 2 healthy supports in their life. Patient will identify skills to successfully complete activity. Patient will identify correlation of this activity to life post-discharge.   Affect/Mood: Appropriate   Participation Level: Active and Engaged   Participation Quality: Independent   Behavior: Calm and Cooperative   Speech/Thought Process: Coherent   Insight: Good   Judgement: Good   Modes of Intervention: Activity   Patient Response to Interventions:  Attentive, Engaged, Interested , and Receptive   Education Outcome:  Acknowledges education   Clinical Observations/Individualized Feedback: [name] was active in their participation of session activities and group discussion. Pt identified "my son and ex wife" as his healthy supports. Pt appropriately used all materials while interacting well with LRT and peers  duration of session.    Plan: Continue to engage patient in RT group sessions 2-3x/week.   Rosina Lowenstein, LRT, CTRS 08/27/2022 3:09 PM

## 2022-08-27 NOTE — [facility] Group Notes (Signed)
[facility] Group Notes:  (Nursing/MHT/Case Management/Adjunct)  Date:  08/27/2022  Time:  10:20 AM  Type of Therapy:  Movement Therapy  Participation Level:  Active  Participation Quality:  Appropriate  Affect:  Appropriate  Cognitive:  Appropriate  Insight:  Appropriate  Engagement in Group:  Engaged  Modes of Intervention:  Activity  Summary of Progress/Problems:  Luis Miller 08/27/2022, 10:20 AM

## 2022-08-27 NOTE — Group Note (Signed)
Date:  08/27/2022 Time:  10:03 PM  Group Topic/Focus:  Building Self Esteem:   The Focus of this group is helping patients become aware of the effects of self-esteem on their lives, the things they and others do that enhance or undermine their self-esteem, seeing the relationship between their level of self-esteem and the choices they make and learning ways to enhance self-esteem. Coping With Mental Health Crisis:   The purpose of this group is to help patients identify strategies for coping with mental health crisis.  Group discusses possible causes of crisis and ways to manage them effectively. Developing a Wellness Toolbox:   The focus of this group is to help patients develop a "wellness toolbox" with skills and strategies to promote recovery upon discharge. Early Warning Signs:   The focus of this group is to help patients identify signs or symptoms they exhibit before slipping into an unhealthy state or crisis. Emotional Education:   The focus of this group is to discuss what feelings/emotions are, and how they are experienced. Goals Group:   The focus of this group is to help patients establish daily goals to achieve during treatment and discuss how the patient can incorporate goal setting into their daily lives to aide in recovery. Identifying Needs:   The focus of this group is to help patients identify their personal needs that have been historically problematic and identify healthy behaviors to address their needs. Making Healthy Choices:   The focus of this group is to help patients identify negative/unhealthy choices they were using prior to admission and identify positive/healthier coping strategies to replace them upon discharge. Managing Feelings:   The focus of this group is to identify what feelings patients have difficulty handling and develop a plan to handle them in a healthier way upon discharge. Personal Choices and Values:   The focus of this group is to help patients assess and  explore the importance of values in their lives, how their values affect their decisions, how they express their values and what opposes their expression.    Participation Level:  Active  Participation Quality:  Appropriate  Affect:  Appropriate  Cognitive:  Alert  Insight: Appropriate, Good, and Improving  Engagement in Group:  Engaged  Modes of Intervention:  Activity  Additional Comments:    Luis Miller 08/27/2022, 10:03 PM

## 2022-08-28 DIAGNOSIS — N186 End stage renal disease: Secondary | ICD-10-CM | POA: Diagnosis not present

## 2022-08-28 MED ORDER — LOPERAMIDE HCL 2 MG PO CAPS
2.0000 mg | ORAL_CAPSULE | ORAL | Status: DC | PRN
  Administered 2022-08-28: 2 mg via ORAL
  Filled 2022-08-28: qty 1

## 2022-08-28 NOTE — [facility] Group Notes (Signed)
[facility] Group Notes:  (Nursing/MHT/Case Management/Adjunct)  Date:  08/28/2022  Time:  2:55 PM  Type of Therapy:  Music Therapy  Participation Level:  Active  Participation Quality:  Appropriate  Affect:  Appropriate  Cognitive:  Appropriate  Insight:  Appropriate  Engagement in Group:  Engaged  Modes of Intervention:  Activity  Summary of Progress/Problems:  [name] 08/28/2022, 2:55 PM

## 2022-08-28 NOTE — Progress Notes (Signed)
The patient was visible in the milieu and attended evening group.  He requested a walker, stating that he feared he would fall after almost losing his balance earlier in the day.  Mood and affect were anxious.  Luis Miller accepted his medications as ordered and denied thoughts of harming himself.  He was administered trazodone for sleep and fell asleep prior to 2200.  The patient awakened at 0200 and was observed talking to himself while organizing his room.  Sleep was inconsistent and broken.
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
## 2022-08-28 NOTE — BH IP Treatment Plan (Signed)
Interdisciplinary Treatment and Diagnostic Plan Update  08/28/2022 Time of Session: 9:45 am Luis Miller. MRN: [id number]  Principal Diagnosis: Bipolar depression (HCC)  Secondary Diagnoses: Principal Problem:   Bipolar depression (HCC)   Current Medications:  Current Facility-Administered Medications  Medication Dose Route Frequency Provider Last Rate Last Admin   acetaminophen (TYLENOL) tablet 650 mg  650 mg Oral Q6H PRN Clapacs, [name], MD   650 mg at 08/27/22 1744   allopurinol (ZYLOPRIM) tablet 100 mg  100 mg Oral BID Clapacs, [name], MD   100 mg at 08/27/22 2119   alteplase (CATHFLO ACTIVASE) injection 2 mg  2 mg Intracatheter Once PRN Lateef, Munsoor, MD       alum & mag hydroxide-simeth (MAALOX/MYLANTA) 200-200-20 MG/5ML suspension 30 mL  30 mL Oral Q4H PRN Clapacs, [name], MD       ARIPiprazole (ABILIFY) tablet 15 mg  15 mg Oral QPC breakfast [name], DO   15 mg at 08/27/22 0930   benazepril (LOTENSIN) tablet 10 mg  10 mg Oral QPM Mosetta Pigeon, MD   10 mg at 08/27/22 1716   buPROPion (WELLBUTRIN XL) 24 hr tablet 150 mg  150 mg Oral Daily [name], DO   150 mg at 08/27/22 7829   cholecalciferol (VITAMIN D3) 25 MCG (1000 UNIT) tablet 1,000 Units  1,000 Units Oral Daily Clapacs, [name], MD   1,000 Units at 08/27/22 0930   doxepin (SINEQUAN) capsule 150 mg  150 mg Oral QHS [name], DO   150 mg at 08/27/22 2119   droperidol (INAPSINE) 2.5 MG/ML injection 0.625 mg  0.625 mg Intravenous Once PRN Yevette Edwards, MD       feeding supplement (NEPRO CARB STEADY) liquid 237 mL  237 mL Oral Q24H Thedore Mins, Harmeet, MD   237 mL at 08/27/22 1230   fentaNYL (SUBLIMAZE) injection 25 mcg  25 mcg Intravenous Q5 min PRN Darleene Cleaver, Gijsbertus F, MD       fluticasone (FLONASE) 50 MCG/ACT nasal spray 2 spray  2 spray Each Nare Daily PRN Clapacs, Jackquline Denmark, MD       heparin injection 1,000 Units  1,000 Units Dialysis PRN Cherylann Ratel, Munsoor, MD        lamoTRIgine (LAMICTAL) tablet 25 mg  25 mg Oral [id number] Herrick, Richard Edward, DO   25 mg at 08/27/22 1657   lidocaine (PF) (XYLOCAINE) 1 % injection 5 mL  5 mL Intradermal PRN Lateef, Munsoor, MD       lidocaine-prilocaine (EMLA) cream 1 Application  1 Application Topical PRN Lateef, Munsoor, MD   1 Application at 08/12/22 0700   loratadine (CLARITIN) tablet 10 mg  10 mg Oral Daily Clapacs, [name], MD   10 mg at 08/27/22 0930   ondansetron (ZOFRAN) injection 4 mg  4 mg Intravenous Once PRN Darleene Cleaver, Gijsbertus F, MD       oxyCODONE (Oxy IR/ROXICODONE) immediate release tablet 5 mg  5 mg Oral Once PRN Yevette Edwards, MD       Or   oxyCODONE (ROXICODONE) 5 MG/5ML solution 5 mg  5 mg Oral Once PRN Yevette Edwards, MD       pentafluoroprop-tetrafluoroeth (GEBAUERS) aerosol 1 Application  1 Application Topical PRN Lateef, Munsoor, MD       polyethylene glycol (MIRALAX / GLYCOLAX) packet 17 g  17 g Oral BID PRN Reggie Pile, MD   17 g at 08/23/22 1702   potassium chloride (KLOR-CON M) CR tablet  10 mEq  10 mEq Oral Daily [name], DO   10 mEq at 08/27/22 1610   promethazine (PHENERGAN) injection 6.25-12.5 mg  6.25-12.5 mg Intravenous Q15 min PRN Yevette Edwards, MD       rosuvastatin (CRESTOR) tablet 10 mg  10 mg Oral Daily Clapacs, Jackquline Denmark, MD   10 mg at 08/27/22 9604   senna-docusate (Senokot-S) tablet 2 tablet  2 tablet Oral QPC breakfast [name], DO   2 tablet at 08/27/22 5409   traZODone (DESYREL) tablet 50 mg  50 mg Oral QHS PRN [name], DO   50 mg at 08/27/22 2119   PTA Medications: Medications Prior to Admission  Medication Sig Dispense Refill Last Dose   amLODipine (NORVASC) 10 MG tablet TAKE 1 TABLET DAILY 90 tablet 1 08/19/2022   benazepril (LOTENSIN) 40 MG tablet Take 40 mg by mouth daily.    08/19/2022   calcitRIOL (ROCALTROL) 0.25 MCG capsule Take 0.25 mcg by mouth daily.   08/19/2022   FLUoxetine (PROZAC) 20 MG capsule Take 1 capsule (20  mg total) by mouth daily. 90 capsule 0 08/19/2022   fluticasone (FLONASE) 50 MCG/ACT nasal spray Place 2 sprays into both nostrils daily. 16 g 6 08/19/2022   hydrALAZINE (APRESOLINE) 100 MG tablet Take 100 mg by mouth 3 (three) times daily.   08/19/2022   lamoTRIgine (LAMICTAL) 25 MG tablet TAKE 1 TABLET DAILY 90 tablet 0 08/19/2022   loperamide (IMODIUM) 2 MG capsule Take 2 mg by mouth daily as needed for diarrhea or loose stools.   08/19/2022   loratadine (CLARITIN) 10 MG tablet TAKE 1 TABLET BY MOUTH EVERY DAY 90 tablet 1 08/19/2022   Multiple Vitamin (MULTIVITAMIN) tablet Take 1 tablet by mouth daily.   08/19/2022   OLANZapine (ZYPREXA) 5 MG tablet Take 1 tablet (5 mg total) by mouth at bedtime. 90 tablet 0 08/19/2022   rosuvastatin (CRESTOR) 10 MG tablet TAKE 1 TABLET BY MOUTH EVERY DAY 90 tablet 0 08/19/2022   Vitamin D, Cholecalciferol, 25 MCG (1000 UT) TABS Take 1,000 tablets by mouth daily.    08/19/2022   allopurinol (ZYLOPRIM) 100 MG tablet Take 100 mg by mouth 2 (two) times daily.       Patient Stressors: Medication change or noncompliance    Patient Strengths: Ability for insight  Average or above average intelligence  Capable of independent living  Communication skills  Motivation for treatment/growth  Supportive family/friends   Treatment Modalities: Medication Management, Group therapy, Case management,  1 to 1 session with clinician, Psychoeducation, Recreational therapy.   Physician Treatment Plan for Primary Diagnosis: Bipolar depression (HCC) Long Term Goal(s): Improvement in symptoms so as ready for discharge   Short Term Goals: Compliance with prescribed medications will improve Ability to verbalize feelings will improve  Medication Management: Evaluate patient's response, side effects, and tolerance of medication regimen.  Therapeutic Interventions: 1 to 1 sessions, Unit Group sessions and Medication administration.  Evaluation of Outcomes: Progressing  Physician  Treatment Plan for Secondary Diagnosis: Principal Problem:   Bipolar depression (HCC)  Long Term Goal(s): Improvement in symptoms so as ready for discharge   Short Term Goals: Compliance with prescribed medications will improve Ability to verbalize feelings will improve     Medication Management: Evaluate patient's response, side effects, and tolerance of medication regimen.  Therapeutic Interventions: 1 to 1 sessions, Unit Group sessions and Medication administration.  Evaluation of Outcomes: Progressing   RN Treatment Plan for Primary Diagnosis: Bipolar depression (HCC) Long  Term Goal(s): Knowledge of disease and therapeutic regimen to maintain health will improve  Short Term Goals: Ability to verbalize feelings will improve, Ability to identify and develop effective coping behaviors will improve, and Compliance with prescribed medications will improve  Medication Management: RN will administer medications as ordered by provider, will assess and evaluate patient's response and provide education to patient for prescribed medication. RN will report any adverse and/or side effects to prescribing provider.  Therapeutic Interventions: 1 on 1 counseling sessions, Psychoeducation, Medication administration, Evaluate responses to treatment, Monitor vital signs and CBGs as ordered, Perform/monitor CIWA, COWS, AIMS and Fall Risk screenings as ordered, Perform wound care treatments as ordered.  Evaluation of Outcomes: Progressing   LCSW Treatment Plan for Primary Diagnosis: Bipolar depression (HCC) Long Term Goal(s): Safe transition to appropriate next level of care at discharge, Engage patient in therapeutic group addressing interpersonal concerns.  Short Term Goals: Engage patient in aftercare planning with referrals and resources, Increase social support, Increase ability to appropriately verbalize feelings, Increase emotional regulation, Identify triggers associated with mental  health/substance abuse issues, and Increase skills for wellness and recovery  Therapeutic Interventions: Assess for all discharge needs, 1 to 1 time with Social worker, Explore available resources and support systems, Assess for adequacy in community support network, Educate family and significant other(s) on suicide prevention, Complete Psychosocial Assessment, Interpersonal group therapy.  Evaluation of Outcomes: Progressing   Progress in Treatment: Attending groups: Yes. Participating in groups: Yes. Taking medication as prescribed: Yes. Toleration medication: Yes. Family/Significant other contact made: Yes, individual(s) contacted:  Elberta Leatherwood [phone] Patient understands diagnosis: Yes. Discussing patient identified problems/goals with staff: Yes. Medical problems stabilized or resolved: Yes. Denies suicidal/homicidal ideation: Yes. Issues/concerns per patient self-inventory: No. Other: none  New problem(s) identified: No, Describe:  none  New Short Term/Long Term Goal(s): Patient to work towards medication management for mood stabilization; elimination of SI thoughts; development of comprehensive mental wellness plan. Update 6/8: No update at this time.   Patient Goals:   No additional goals identified at this time. Patient to continue to work towards original goals identified in initial treatment team meeting. CSW will remain available to patient should they voice additional treatment goals. Update 6/8: The patient has not identified any new goals at this time.   Discharge Plan or Barriers: No psychosocial barriers identified at this time, patient to return to place of residence when appropriate for discharge. Update 6/8: There are no new barriers identified at this time. The patient will return home, the doctor has not provided a discharge date as of yet.   Reason for Continuation of Hospitalization: Depression Medication stabilization  Estimated Length of Stay:  1-7 days Update 6/8: TBD  Last 3 [place] Suicide Severity Risk Score: Flowsheet Row Admission (Current) from 07/30/2022 in [facility] BE[facility] Most recent reading at 08/23/2022 12:20 PM ED from 07/30/2022 in [facility] Emergency Department at [facility] Most recent reading at 07/30/2022  3:10 PM Office Visit from 07/26/2022 in confidential department Most recent reading at 07/27/2022 11:27 AM  C-SSRS RISK CATEGORY No Risk No Risk No Risk       Last PHQ 2/9 Scores:    07/26/2022    9:10 AM 02/24/2022    1:14 PM 01/25/2022    1:34 PM  Depression screen PHQ 2/9  Decreased Interest 1 0 0  Down, Depressed, Hopeless 1 0 0  PHQ - 2 Score 2 0 0  Altered sleeping 0 0 0  Tired, decreased energy 0 0  0  Change in appetite 0 0 0  Feeling bad or failure about yourself  1 0 0  Trouble concentrating 0 0 0  Moving slowly or fidgety/restless 0 0 0  Suicidal thoughts 0 0 0  PHQ-9 Score 3 0 0  Difficult doing work/chores  Not difficult at all     Scribe for Treatment Team: Marshell Levan, LCSW 08/28/2022 9:48 AM

## 2022-08-28 NOTE — Plan of Care (Signed)

## 2022-08-28 NOTE — Group Note (Signed)
Date:  08/28/2022 Time:  9:02 PM  Group Topic/Focus:  Healthy Communication:   The focus of this group is to discuss communication, barriers to communication, as well as healthy ways to communicate with others.    Participation Level:  Active  Participation Quality:  Appropriate  Affect:  Appropriate  Cognitive:  Appropriate  Insight: Good  Engagement in Group:  Engaged  Modes of Intervention:  Education  Additional Comments:    Garry Heater 08/28/2022, 9:02 PM

## 2022-08-28 NOTE — Progress Notes (Signed)
[facility]  ROUNDING NOTE   Subjective:   Patient seen and evaluated during dialysis   HEMODIALYSIS FLOWSHEET:  Blood Flow Rate (mL/min): 400 mL/min Arterial Pressure (mmHg): -250 mmHg Venous Pressure (mmHg): 290 mmHg TMP (mmHg): 9 mmHg Ultrafiltration Rate (mL/min): 0 mL/min Dialysate Flow Rate (mL/min): 300 ml/min Dialysis Fluid Bolus: Normal Saline Bolus Amount (mL): 100 mL  Seated in chair States he feels like ECT was not helpful States he feels like he is getting worse.  Complains of restrictive diet   Objective:  Vital signs in last 24 hours:  Temp:  [98.1 F (36.7 C)-98.4 F (36.9 C)] 98.4 F (36.9 C) (06/08 0845) Pulse Rate:  [72-89] 72 (06/08 1100) Resp:  [14-16] 14 (06/08 1030) BP: (93-145)/(65-95) 137/79 (06/08 1100) SpO2:  [98 %-100 %] 98 % (06/08 1100)  Weight change:  Filed Weights   08/24/22 1302 08/26/22 0737 08/26/22 1149  Weight: 81.3 kg 81.6 kg 81.1 kg    Intake/Output: I/O last 3 completed shifts: In: 118 [P.O.:118] Out: 550 [Urine:550]   Intake/Output this shift:  Total I/O In: 480 [P.O.:480] Out: 200 [Urine:200]  Physical Exam: General: NAD, seated in chair  Head: Normocephalic, atraumatic. Moist oral mucosal membranes  Eyes: Anicteric  Lungs:  Clear to auscultation  Heart: Regular rate  Abdomen:  Soft, nontender  Extremities:  no peripheral edema.  Neurologic: Nonfocal, moving all four extremities  Skin: No lesions  Access: Left AVG    Basic Metabolic Panel: Recent Labs  Lab 08/24/22 0724 08/26/22 0800  NA 138 136  K 4.3 3.8  CL 101 99  CO2 24 25  GLUCOSE 101* 105*  BUN 95* 77*  CREATININE 7.08* 5.70*  CALCIUM 9.6 9.3  PHOS 4.2 3.9     Liver Function Tests: Recent Labs  Lab 08/24/22 0724 08/26/22 0800  ALBUMIN 3.9 3.7    No results for input(s): "LIPASE", "AMYLASE" in the last 168 hours. No results for input(s): "AMMONIA" in the last 168 hours.  CBC: Recent Labs  Lab 08/24/22 0724  08/26/22 0800  WBC 4.6 3.7*  HGB 9.6* 9.1*  HCT 26.7* 25.2*  MCV 97.4 98.1  PLT 108* 103*     Cardiac Enzymes: No results for input(s): "CKTOTAL", "CKMB", "CKMBINDEX", "TROPONINI" in the last 168 hours.  BNP: Invalid input(s): "POCBNP"  CBG: No results for input(s): "GLUCAP" in the last 168 hours.   Microbiology: Results for orders placed or performed in visit on 05/12/22  Microscopic Examination     Status: Abnormal   Collection Time: 05/12/22 10:39 AM   Urine  Result Value Ref Range Status   WBC, UA 6-10 (A) 0 - 5 /hpf Final   RBC, Urine 0-2 0 - 2 /hpf Final   Epithelial Cells (non renal) 0-10 0 - 10 /hpf Final   Bacteria, UA Moderate (A) None seen/Few Final    Coagulation Studies: No results for input(s): "LABPROT", "INR" in the last 72 hours.  Urinalysis: No results for input(s): "COLORURINE", "LABSPEC", "PHURINE", "GLUCOSEU", "HGBUR", "BILIRUBINUR", "KETONESUR", "PROTEINUR", "UROBILINOGEN", "NITRITE", "LEUKOCYTESUR" in the last 72 hours.  Invalid input(s): "APPERANCEUR"    Imaging: No results found.   Medications:      allopurinol  100 mg Oral BID   ARIPiprazole  15 mg Oral QPC breakfast   benazepril  10 mg Oral QPM   buPROPion  150 mg Oral Daily   cholecalciferol  1,000 Units Oral Daily   doxepin  150 mg Oral QHS   feeding supplement (NEPRO CARB STEADY)  237  mL Oral Q24H   lamoTRIgine  25 mg Oral [id number]   loratadine  10 mg Oral Daily   potassium chloride  10 mEq Oral Daily   rosuvastatin  10 mg Oral Daily   senna-docusate  2 tablet Oral QPC breakfast   acetaminophen, alteplase, alum & mag hydroxide-simeth, droperidol, fentaNYL (SUBLIMAZE) injection, fluticasone, heparin, lidocaine (PF), lidocaine-prilocaine, ondansetron (ZOFRAN) IV, oxyCODONE **OR** oxyCODONE, pentafluoroprop-tetrafluoroeth, polyethylene glycol, promethazine, traZODone  Assessment/ Plan:  Mr. Brad Lieurance. is a 77 y.o. white male with end stage renal disease on  hemodialysis, hypertension, gout, depression, history of melanoma, crohn's, and CVA who presents [facility]2022 for Bipolar depression (HCC) [F31.9]  [facility] TTS [address] Left AVG 87kg  End Stage Renal Disease on hemodialysis:  Receiving dialysis today, due to soft blood pressure, will hold UF.  Next treatment scheduled for Tuesday.  Will change to regular diet and monitor.   Anemia with chronic kidney disease: Hgb 9.1, at goal.   Hypertension with chronic kidney disease: Hypotensive on treatment today. Current regimen of amlodipine, benazepril. Patient did not get his medications this morning prior to dialysis. - Blood pressure 138/90 during dialysis  Secondary Hyperparathyroidism: outpatient PTH of 456.   Calcium and phosphorus within desired range.    LOS: 29   6/8/202411:30 AM

## 2022-08-28 NOTE — Progress Notes (Signed)
PRN Imodium ordered and administered after patient had multiple episodes of diarrhea.

## 2022-08-28 NOTE — Progress Notes (Signed)
Received patient in bed to unit.    Informed consent signed and in chart.    TX duration:3.5     Transported Back to room by Staff member from his unit Hand-off given to patient's nurse.    Access used: L Graft Access issues: None   Total UF removed: 500 Medication(s) given:None Post HD VS: 157/90 Post HD weight: 81.8kg    [facility]

## 2022-08-28 NOTE — Progress Notes (Signed)
Patient pleasant and in the dayroom for breakfast.  Sad affect.  Patient reports poor sleep, worrying and depression. Patient has decided to stop ECT treatments because he felt there had been no improvement in his mood.  Denies SI/HI.  Endorses VH.  "I am seeing things that are not there."  Medications given after dialysis.  15 min checks in place for safety. Patient is present in the milieu with appropriate interaction with peers and staff.

## 2022-08-28 NOTE — Group Note (Signed)
Date:  08/28/2022 Time:  3:44 PM  Group Topic/Focus:  Overcoming Stress:   The focus of this group is to define stress and help patients assess their triggers.    Participation Level:  Active  Participation Quality:  Appropriate  Affect:  Appropriate and Depressed  Cognitive:  Alert, Appropriate, and Oriented  Insight: Good  Engagement in Group:  Supportive  Modes of Intervention:  Activity and Discussion  Additional Comments:    Leonie Green 08/28/2022, 3:44 PM

## 2022-08-29 NOTE — Group Note (Signed)
Date:  08/29/2022 Time:  9:48 PM  Group Topic/Focus:  Building Self Esteem:   The Focus of this group is helping patients become aware of the effects of self-esteem on their lives, the things they and others do that enhance or undermine their self-esteem, seeing the relationship between their level of self-esteem and the choices they make and learning ways to enhance self-esteem. Coping With Mental Health Crisis:   The purpose of this group is to help patients identify strategies for coping with mental health crisis.  Group discusses possible causes of crisis and ways to manage them effectively. Conflict Resolution:   The focus of this group is to discuss the conflict resolution process and how it may be used upon discharge. Developing a Wellness Toolbox:   The focus of this group is to help patients develop a "wellness toolbox" with skills and strategies to promote recovery upon discharge. Early Warning Signs:   The focus of this group is to help patients identify signs or symptoms they exhibit before slipping into an unhealthy state or crisis. Emotional Education:   The focus of this group is to discuss what feelings/emotions are, and how they are experienced. Goals Group:   The focus of this group is to help patients establish daily goals to achieve during treatment and discuss how the patient can incorporate goal setting into their daily lives to aide in recovery. Healthy Communication:   The focus of this group is to discuss communication, barriers to communication, as well as healthy ways to communicate with others. Identifying Needs:   The focus of this group is to help patients identify their personal needs that have been historically problematic and identify healthy behaviors to address their needs. Making Healthy Choices:   The focus of this group is to help patients identify negative/unhealthy choices they were using prior to admission and identify positive/healthier coping strategies to  replace them upon discharge. Managing Feelings:   The focus of this group is to identify what feelings patients have difficulty handling and develop a plan to handle them in a healthier way upon discharge. Overcoming Stress:   The focus of this group is to define stress and help patients assess their triggers. Personal Choices and Values:   The focus of this group is to help patients assess and explore the importance of values in their lives, how their values affect their decisions, how they express their values and what opposes their expression.    Participation Level:  Active  Participation Quality:  Appropriate  Affect:  Appropriate  Cognitive:  Appropriate  Insight: Appropriate  Engagement in Group:  Engaged  Modes of Intervention:  Discussion and Support  Additional Comments:    Luis Miller 08/29/2022, 9:48 PM

## 2022-08-29 NOTE — [facility] Group Notes (Signed)
Time: 10:00am-11:30   Group Topic/Focus:  Healthy Communication: Support systems communication  Love Languages      Participation Level:  Active   Participation Quality:  Appropriate   Affect:  Appropriate   Cognitive:  Appropriate   Insight: Good   Engagement in Group:  Engaged   Modes of Intervention:  Education   Additional Comments:     
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
## 2022-08-29 NOTE — Progress Notes (Addendum)
The patient was cooperative and pleasant with a flat/sad affect.  He participated in group and watched a movie before bedtime.  Medications were accepted as ordered and Luis Miller did not voice any complaints.  The patient has been using a walker to ambulate in the hallways and while in his room.  [name] fell asleep before 2200 and slept, undisturbed, until the morning.

## 2022-08-29 NOTE — Group Note (Signed)
LCSW Group Therapy Note  Group Date: 08/29/2022 Start Time: 1318 End Time: 1348   Type of Therapy and Topic:  Group Therapy -  Coping Skills  Participation Level:  Active   Description of Group The focus of this group was to determine what unhealthy coping techniques typically are used by group members and what healthy coping techniques would be helpful in coping with various problems. Patients were guided in becoming aware of the differences between healthy and unhealthy coping techniques. Patients were asked to identify 2-3 healthy coping skills they would like to learn to use more effectively.  Therapeutic Goals Patients learned that coping is what human beings do all day long to deal with various situations in their lives Patients defined and discussed healthy vs unhealthy coping techniques Patients identified their preferred coping techniques and identified whether these were healthy or unhealthy Patients determined 2-3 healthy coping skills they would like to become more familiar with and use more often. Patients provided support and ideas to each other   Summary of Patient Progress: The patient attended group. The patient played the coping skills bingo game. The patient stated that he uses the taking deep breaths coping skill. The patient also won the second round of bingo.    Marshell Levan, LCSWA 08/29/2022  1:57 PM

## 2022-08-29 NOTE — Progress Notes (Signed)
D- Patient alert and oriented x 4. Affect flat/mood depressed. Denies SI/ HI/ AVH. Patient denies pain. Patient endorses depression and anxiety. States "I don't want to have ECT anymore; states it was weird". He was instructed on all medications- actions, doses and adverse reactions. He is independent with ADL's and ambulates without assistance. A- Scheduled medications administered to patient, per MD orders. Support and encouragement provided.  Routine safety checks conducted every 15 minutes without incident.  Patient informed to notify staff with problems or concerns and verbalizes understanding. R- No adverse drug reactions noted.  Patient compliant with medications and treatment plan. Patient receptive, calm, cooperative and interacts well with others on the unit.  Patient contracts for safety and  remains safe on the unit at this time.

## 2022-08-30 ENCOUNTER — Ambulatory Visit: Payer: Medicare Other | Admitting: Nurse Practitioner

## 2022-08-30 DIAGNOSIS — F319 Bipolar disorder, unspecified: Secondary | ICD-10-CM | POA: Diagnosis not present

## 2022-08-30 MED ORDER — BUPROPION HCL ER (XL) 150 MG PO TB24
150.0000 mg | ORAL_TABLET | Freq: Every day | ORAL | Status: DC
  Administered 2022-08-31 – 2022-09-25 (×26): 150 mg via ORAL
  Filled 2022-08-30 (×26): qty 1

## 2022-08-30 MED ORDER — DOXEPIN HCL 50 MG PO CAPS
100.0000 mg | ORAL_CAPSULE | Freq: Every day | ORAL | Status: DC
  Administered 2022-08-30 – 2022-09-25 (×27): 100 mg via ORAL
  Filled 2022-08-30 (×27): qty 2

## 2022-08-30 MED ORDER — BUPROPION HCL ER (XL) 300 MG PO TB24: 300.0000 mg | ORAL_TABLET | Freq: Every day | ORAL | Status: DC

## 2022-08-30 NOTE — Plan of Care (Signed)
D- Patient alert and oriented. Mood is pleasant.pt easily answers questions asked of him Denies SI, HI, AVH, and pain. Pt has been social in the milieu and watching television. Marland Kitchen A- Scheduled medications administered to patient, per MD orders. Support and encouragement provided.  Routine safety checks conducted every 15 minutes.  Patient informed to notify staff with problems or concerns.  R- No adverse drug reactions noted. Patient contracts for safety at this time. Patient compliant with medications and treatment plan. Patient receptive, calm, and cooperative. Patient interacts well with others on the unit.  Patient remains safe at this time.

## 2022-08-30 NOTE — Progress Notes (Signed)
Pr has remained pleasant and cooperative throughout the shift. No difficulties voiced. Pt is social with staff and peers, denies s/I,h/I or AVH.
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
## 2022-08-30 NOTE — Progress Notes (Signed)
   08/29/22 2000  Psych Admission Type (Psych Patients Only)  Admission Status Voluntary  Psychosocial Assessment  Patient Complaints Anxiety;Worrying  Eye Contact Fair  Facial Expression Sad  Affect Sad  Speech Logical/coherent  Interaction Assertive  Motor Activity Slow  Appearance/Hygiene Unremarkable  Behavior Characteristics Cooperative;Appropriate to situation  Mood Depressed  Thought Process  Coherency WDL  Content WDL  Delusions None reported or observed  Perception WDL  Hallucination None reported or observed  Judgment WDL  Confusion None  Danger to Self  Current suicidal ideation?  (Denies)  Agreement Not to Harm Self Yes  Description of Agreement verbal  Danger to Others  Danger to Others None reported or observed   Patient drank 60 ml of liquid this shift. Support and encouragement provided.

## 2022-08-30 NOTE — Group Note (Signed)
Date:  08/30/2022 Time:  9:32 PM  Group Topic/Focus:  Building Self Esteem:   The Focus of this group is helping patients become aware of the effects of self-esteem on their lives, the things they and others do that enhance or undermine their self-esteem, seeing the relationship between their level of self-esteem and the choices they make and learning ways to enhance self-esteem. Coping With Mental Health Crisis:   The purpose of this group is to help patients identify strategies for coping with mental health crisis.  Group discusses possible causes of crisis and ways to manage them effectively. Developing a Wellness Toolbox:   The focus of this group is to help patients develop a "wellness toolbox" with skills and strategies to promote recovery upon discharge. Dimensions of Wellness:   The focus of this group is to introduce the topic of wellness and discuss the role each dimension of wellness plays in total health. Goals Group:   The focus of this group is to help patients establish daily goals to achieve during treatment and discuss how the patient can incorporate goal setting into their daily lives to aide in recovery. Healthy Communication:   The focus of this group is to discuss communication, barriers to communication, as well as healthy ways to communicate with others. Making Healthy Choices:   The focus of this group is to help patients identify negative/unhealthy choices they were using prior to admission and identify positive/healthier coping strategies to replace them upon discharge. Managing Feelings:   The focus of this group is to identify what feelings patients have difficulty handling and develop a plan to handle them in a healthier way upon discharge. Overcoming Stress:   The focus of this group is to define stress and help patients assess their triggers. Personal Choices and Values:   The focus of this group is to help patients assess and explore the importance of values in their  lives, how their values affect their decisions, how they express their values and what opposes their expression.    Participation Level:  Active  Participation Quality:  Appropriate  Affect:  Appropriate  Cognitive:  Alert  Insight: Improving  Engagement in Group:  Engaged  Modes of Intervention:  Discussion  Additional Comments:    Luis Miller 08/30/2022, 9:32 PM

## 2022-08-30 NOTE — Group Note (Signed)
LCSW Group Therapy Note  Group Date: 08/30/2022 Start Time: 1305 End Time: 1400   Type of Therapy and Topic:  Group Therapy - Healthy vs Unhealthy Coping Skills  Participation Level:  Active   Description of Group The focus of this group was to determine what unhealthy coping techniques typically are used by group members and what healthy coping techniques would be helpful in coping with various problems. Patients were guided in becoming aware of the differences between healthy and unhealthy coping techniques. Patients were asked to identify 2-3 healthy coping skills they would like to learn to use more effectively.  Therapeutic Goals Patients learned that coping is what human beings do all day long to deal with various situations in their lives Patients defined and discussed healthy vs unhealthy coping techniques Patients identified their preferred coping techniques and identified whether these were healthy or unhealthy Patients determined 2-3 healthy coping skills they would like to become more familiar with and use more often. Patients provided support and ideas to each other   Summary of Patient Progress:  During group, patient expressed clear understanding of coping skills for depression.  Patient proved open to input from peers and feedback from CSW. Patient demonstrated fair insight into the subject matter, was respectful of peers, and participated throughout the entire session.  Patient expressed excitement over the possibility of being discharged home.    Therapeutic Modalities Cognitive Behavioral Therapy Motivational Interviewing  Harden Mo, [place] 08/30/2022  2:00 PM

## 2022-08-31 DIAGNOSIS — F319 Bipolar disorder, unspecified: Secondary | ICD-10-CM | POA: Diagnosis not present

## 2022-08-31 DIAGNOSIS — N186 End stage renal disease: Secondary | ICD-10-CM | POA: Diagnosis not present

## 2022-08-31 LAB — RENAL FUNCTION PANEL
Albumin: 3.6 g/dL (ref 3.5–5.0)
Anion gap: 13 (ref 5–15)
BUN: 73 mg/dL — ABNORMAL HIGH (ref 8–23)
CO2: 23 mmol/L (ref 22–32)
Calcium: 8.9 mg/dL (ref 8.9–10.3)
Chloride: 102 mmol/L (ref 98–111)
Creatinine, Ser: 5.84 mg/dL — ABNORMAL HIGH (ref 0.61–1.24)
GFR, Estimated: 9 mL/min — ABNORMAL LOW (ref >60)
Glucose, Bld: 99 mg/dL (ref 70–99)
Phosphorus: 3.9 mg/dL (ref 2.5–4.6)
Potassium: 3.6 mmol/L (ref 3.5–5.1)
Sodium: 138 mmol/L (ref 135–145)

## 2022-08-31 LAB — CBC
HCT: 23.7 % — ABNORMAL LOW (ref 39.0–52.0)
Hemoglobin: 8.3 g/dL — ABNORMAL LOW (ref 13.0–17.0)
MCH: 35.2 pg — ABNORMAL HIGH (ref 26.0–34.0)
MCHC: 35 g/dL (ref 30.0–36.0)
MCV: 100.4 fL — ABNORMAL HIGH (ref 80.0–100.0)
Platelets: 115 10*3/uL — ABNORMAL LOW (ref 150–400)
RBC: 2.36 MIL/uL — ABNORMAL LOW (ref 4.22–5.81)
RDW: 12.9 % (ref 11.5–15.5)
WBC: 3.1 10*3/uL — ABNORMAL LOW (ref 4.0–10.5)
nRBC: 0 % (ref 0.0–0.2)

## 2022-08-31 MED ORDER — EPOETIN ALFA 10000 UNIT/ML IJ SOLN
INTRAMUSCULAR | Status: AC
  Filled 2022-08-31: qty 1

## 2022-08-31 MED ORDER — EPOETIN ALFA 10000 UNIT/ML IJ SOLN
10000.0000 [IU] | INTRAMUSCULAR | Status: DC
  Administered 2022-08-31 – 2022-09-14 (×7): 10000 [IU] via INTRAVENOUS

## 2022-08-31 NOTE — Progress Notes (Signed)
[facility]  ROUNDING NOTE   Subjective:   Patient seen and evaluated during dialysis   HEMODIALYSIS FLOWSHEET:  Blood Flow Rate (mL/min): 350 mL/min Arterial Pressure (mmHg): -110 mmHg Venous Pressure (mmHg): 270 mmHg TMP (mmHg): 14 mmHg Ultrafiltration Rate (mL/min): 457 mL/min Dialysate Flow Rate (mL/min): 300 ml/min Dialysis Fluid Bolus: Normal Saline Bolus Amount (mL): 100 mL  Tolerating treatment well Seated in chair Completed breakfast  Objective:  Vital signs in last 24 hours:  Temp:  [98.1 F (36.7 C)-99.7 F (37.6 C)] 98.2 F (36.8 C) (06/11 0811) Pulse Rate:  [59-84] 59 (06/11 1100) Resp:  [14-24] 24 (06/11 1100) BP: (104-145)/(64-101) 139/77 (06/11 1100) SpO2:  [90 %-100 %] 99 % (06/11 1100) Weight:  [81.8 kg] 81.8 kg (06/11 0759)  Weight change:  Filed Weights   08/28/22 0845 08/28/22 1243 08/31/22 0759  Weight: 82.3 kg 81.1 kg 81.8 kg    Intake/Output: I/O last 3 completed shifts: In: 120 [P.O.:120] Out: 700 [Urine:700]   Intake/Output this shift:  No intake/output data recorded.  Physical Exam: General: NAD, seated in chair  Head: Normocephalic, atraumatic. Moist oral mucosal membranes  Eyes: Anicteric  Lungs:  Clear to auscultation  Heart: Regular rate  Abdomen:  Soft, nontender  Extremities:  no peripheral edema.  Neurologic: Nonfocal, moving all four extremities  Skin: No lesions  Access: Left AVG    Basic Metabolic Panel: Recent Labs  Lab 08/26/22 0800 08/31/22 0830  NA 136 138  K 3.8 3.6  CL 99 102  CO2 25 23  GLUCOSE 105* 99  BUN 77* 73*  CREATININE 5.70* 5.84*  CALCIUM 9.3 8.9  PHOS 3.9 3.9     Liver Function Tests: Recent Labs  Lab 08/26/22 0800 08/31/22 0830  ALBUMIN 3.7 3.6    No results for input(s): "LIPASE", "AMYLASE" in the last 168 hours. No results for input(s): "AMMONIA" in the last 168 hours.  CBC: Recent Labs  Lab 08/26/22 0800 08/31/22 0830  WBC 3.7* 3.1*  HGB 9.1* 8.3*   HCT 25.2* 23.7*  MCV 98.1 100.4*  PLT 103* 115*     Cardiac Enzymes: No results for input(s): "CKTOTAL", "CKMB", "CKMBINDEX", "TROPONINI" in the last 168 hours.  BNP: Invalid input(s): "POCBNP"  CBG: No results for input(s): "GLUCAP" in the last 168 hours.   Microbiology: Results for orders placed or performed in visit on 05/12/22  Microscopic Examination     Status: Abnormal   Collection Time: 05/12/22 10:39 AM   Urine  Result Value Ref Range Status   WBC, UA 6-10 (A) 0 - 5 /hpf Final   RBC, Urine 0-2 0 - 2 /hpf Final   Epithelial Cells (non renal) 0-10 0 - 10 /hpf Final   Bacteria, UA Moderate (A) None seen/Few Final    Coagulation Studies: No results for input(s): "LABPROT", "INR" in the last 72 hours.  Urinalysis: No results for input(s): "COLORURINE", "LABSPEC", "PHURINE", "GLUCOSEU", "HGBUR", "BILIRUBINUR", "KETONESUR", "PROTEINUR", "UROBILINOGEN", "NITRITE", "LEUKOCYTESUR" in the last 72 hours.  Invalid input(s): "APPERANCEUR"    Imaging: No results found.   Medications:      allopurinol  100 mg Oral BID   ARIPiprazole  15 mg Oral QPC breakfast   benazepril  10 mg Oral QPM   buPROPion  150 mg Oral Daily   cholecalciferol  1,000 Units Oral Daily   doxepin  100 mg Oral QHS   feeding supplement (NEPRO CARB STEADY)  237 mL Oral Q24H   lamoTRIgine  25 mg Oral [id number]  loratadine  10 mg Oral Daily   potassium chloride  10 mEq Oral Daily   rosuvastatin  10 mg Oral Daily   senna-docusate  2 tablet Oral QPC breakfast   acetaminophen, alteplase, alum & mag hydroxide-simeth, droperidol, fentaNYL (SUBLIMAZE) injection, fluticasone, heparin, lidocaine (PF), lidocaine-prilocaine, loperamide, ondansetron (ZOFRAN) IV, pentafluoroprop-tetrafluoroeth, polyethylene glycol, promethazine, traZODone  Assessment/ Plan:  [name]. is a 77 y.o. white male with end stage renal disease on hemodialysis, hypertension, gout, depression, history of melanoma,  crohn's, and CVA who presents to [facility] on 07/30/2022 for Bipolar depression (HCC) [F31.9]  [facility] [address] Left AVG 87kg  End Stage Renal Disease on hemodialysis:  Dialysis today with UF 0.5-1L as tolerated. Next treatment scheduled for Thursday.   Anemia with chronic kidney disease: Hgb 8.3, Will order EPO with dialysis treatment  Hypertension with chronic kidney disease: Hypotensive on treatment today. Current regimen of amlodipine, benazepril. Patient did not get his medications this morning prior to dialysis. - Blood pressure 139/77 during dialysis  Secondary Hyperparathyroidism: outpatient PTH of 456.   Bone minerals remains acceptable.    LOS: 32   6/11/202411:08 AM

## 2022-08-31 NOTE — Group Note (Signed)
Date:  08/31/2022 Time:  8:57 PM  Group Topic/Focus:  Emotional Education:   The focus of this group is to discuss what feelings/emotions are, and how they are experienced.    Participation Level:  Active  Participation Quality:  Supportive  Affect:  Appropriate  Cognitive:  Alert  Insight: Appropriate  Engagement in Group:  Engaged  Modes of Intervention:  Discussion  Additional Comments:    Burt Ek 08/31/2022, 8:57 PM

## 2022-08-31 NOTE — [facility] Group Notes (Signed)
[facility] Group Notes:  (Nursing/MHT/Case Management/Adjunct)  Date:  08/31/2022  Time:  11:00 AM  Type of Therapy:  Outside Recreation   Participation Level:  Did Not Attend  Participation Quality:    Affect:    Cognitive:    Insight:    Engagement in Group:    Modes of Intervention:    Summary of Progress/Problems:Did not attend   Torryn Fiske T Noe Gens 08/31/2022, 11:00 AM
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
## 2022-08-31 NOTE — Progress Notes (Signed)
The patient attended evening group, watched television, and socialized with peers.  At times, he appeared preoccupied with thoughts and distant.  [name] accepted his medications as ordered and was pleasant upon contact.  He appropriately advocated for himself and utilized his walker when ambulating.  Mood and affect were sad/anxious/flat.  No significant changes to note.

## 2022-08-31 NOTE — Procedures (Signed)
Received patient in bed to unit.  Alert and oriented.  Informed consent signed and in chart.   TX duration: 3.5hrs  Patient tolerated well.  Transported back to the room  Alert, without acute distress.  Hand-off given to patient's nurse.   Access used: Left upper arm AVF. Access issues: High venous pressure.  Total UF removed: 1L Medication(s) given: Epogen     [facility]

## 2022-08-31 NOTE — [facility] Group Notes (Signed)
[facility] Group Notes:  (Nursing/MHT/Case Management/Adjunct)  Date:  08/31/2022  Time:  8:34 AM  Type of Therapy:  Music Therapy  Participation Level:  Did Not Attend    [name] 08/31/2022, 8:34 AM

## 2022-08-31 NOTE — Progress Notes (Signed)
   08/31/22 1000  Psych Admission Type (Psych Patients Only)  Admission Status Voluntary  Psychosocial Assessment  Patient Complaints Depression  Eye Contact Fair  Facial Expression Flat  Affect Flat  Speech Logical/coherent  Interaction Assertive  Motor Activity Slow  Appearance/Hygiene In scrubs  Behavior Characteristics Appropriate to situation  Mood Depressed  Thought Process  Coherency WDL  Content WDL  Delusions None reported or observed  Perception WDL  Hallucination None reported or observed  Judgment WDL  Confusion WDL  Danger to Self  Current suicidal ideation? Denies  Agreement Not to Harm Self Yes  Danger to Others  Danger to Others None reported or observed   Patient appears stable this shift. Went for dialysis today. Continues to ambulate with walker for steady gait. Tolerated all medications and meals since arrival from dialysis. Complained of headache upon arrival from dialysis and was medicated for pain as ordered with effectiveness. Denies HI/AVH. Remains with a flat affect and endorses depression.

## 2022-09-01 DIAGNOSIS — F319 Bipolar disorder, unspecified: Secondary | ICD-10-CM | POA: Diagnosis not present
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
## 2022-09-01 NOTE — Group Note (Signed)
Date:  09/01/2022 Time:  9:06 PM  Group Topic/Focus:  Goals Group:   The focus of this group is to help patients establish daily goals to achieve during treatment and discuss how the patient can incorporate goal setting into their daily lives to aide in recovery.    Participation Level:  Active  Participation Quality:  Appropriate  Affect:  Appropriate  Cognitive:  Appropriate  Insight: Appropriate  Engagement in Group:  Engaged  Modes of Intervention:  Support  Additional Comments:    Jeannette How 09/01/2022, 9:06 PM

## 2022-09-01 NOTE — Progress Notes (Addendum)
Patient tolerated al medications and meals. Interacted with peers and staff. Attended group.   09/01/22 1700  Psych Admission Type (Psych Patients Only)  Admission Status Voluntary  Psychosocial Assessment  Patient Complaints Depression  Eye Contact Fair  Facial Expression Flat  Affect Flat  Speech Logical/coherent  Interaction Assertive  Motor Activity Slow  Appearance/Hygiene In scrubs  Behavior Characteristics Appropriate to situation  Mood Depressed  Thought Process  Coherency WDL  Content WDL  Delusions None reported or observed  Perception WDL  Hallucination None reported or observed  Judgment WDL  Confusion WDL  Danger to Self  Current suicidal ideation? Denies  Agreement Not to Harm Self Yes  Danger to Others  Danger to Others None reported or observed

## 2022-09-01 NOTE — [facility] Group Notes (Signed)
[facility] Group Notes:  (Nursing/MHT/Case Management/Adjunct)  Date:  09/01/2022  Time:  10:47 AM  Type of Therapy: Outside Recreation   Participation Level:  Minimal  Participation Quality:  Appropriate  Affect:  Appropriate  Cognitive:  Alert and Appropriate  Insight:  Appropriate  Engagement in Group:  Engaged  Modes of Intervention:  Activity  Summary of Progress/Problems:  Luis Miller 09/01/2022, 10:47 AM

## 2022-09-01 NOTE — Progress Notes (Signed)
Patient slept with CPAP.    08/31/22 2300  Psych Admission Type (Psych Patients Only)  Admission Status Voluntary  Psychosocial Assessment  Patient Complaints Depression  Eye Contact Fair  Facial Expression Flat  Affect Flat  Speech Logical/coherent  Interaction Assertive  Motor Activity Slow  Appearance/Hygiene In scrubs  Behavior Characteristics Appropriate to situation  Mood Depressed  Thought Process  Coherency WDL  Content WDL  Delusions None reported or observed  Perception WDL  Hallucination None reported or observed  Judgment WDL  Confusion WDL  Danger to Self  Current suicidal ideation? Denies  Agreement Not to Harm Self Yes  Danger to Others  Danger to Others None reported or observed

## 2022-09-02 DIAGNOSIS — F319 Bipolar disorder, unspecified: Secondary | ICD-10-CM | POA: Diagnosis not present

## 2022-09-02 LAB — CBC
HCT: 24.5 % — ABNORMAL LOW (ref 39.0–52.0)
Hemoglobin: 8.5 g/dL — ABNORMAL LOW (ref 13.0–17.0)
MCH: 34.6 pg — ABNORMAL HIGH (ref 26.0–34.0)
MCHC: 34.7 g/dL (ref 30.0–36.0)
MCV: 99.6 fL (ref 80.0–100.0)
Platelets: 130 10*3/uL — ABNORMAL LOW (ref 150–400)
RBC: 2.46 MIL/uL — ABNORMAL LOW (ref 4.22–5.81)
RDW: 12.9 % (ref 11.5–15.5)
WBC: 3.6 10*3/uL — ABNORMAL LOW (ref 4.0–10.5)
nRBC: 0 % (ref 0.0–0.2)

## 2022-09-02 LAB — RENAL FUNCTION PANEL
Albumin: 3.6 g/dL (ref 3.5–5.0)
Anion gap: 10 (ref 5–15)
BUN: 68 mg/dL — ABNORMAL HIGH (ref 8–23)
CO2: 25 mmol/L (ref 22–32)
Calcium: 9.3 mg/dL (ref 8.9–10.3)
Chloride: 103 mmol/L (ref 98–111)
Creatinine, Ser: 5.7 mg/dL — ABNORMAL HIGH (ref 0.61–1.24)
GFR, Estimated: 10 mL/min — ABNORMAL LOW (ref >60)
Glucose, Bld: 108 mg/dL — ABNORMAL HIGH (ref 70–99)
Phosphorus: 2.9 mg/dL (ref 2.5–4.6)
Potassium: 3.7 mmol/L (ref 3.5–5.1)
Sodium: 138 mmol/L (ref 135–145)

## 2022-09-02 MED ORDER — PENTAFLUOROPROP-TETRAFLUOROETH EX AERO
INHALATION_SPRAY | CUTANEOUS | Status: AC
  Filled 2022-09-02: qty 30

## 2022-09-02 MED ORDER — EPOETIN ALFA 10000 UNIT/ML IJ SOLN
INTRAMUSCULAR | Status: AC
  Filled 2022-09-02: qty 1

## 2022-09-02 MED ORDER — BENAZEPRIL HCL 20 MG PO TABS
20.0000 mg | ORAL_TABLET | Freq: Every evening | ORAL | Status: DC
  Administered 2022-09-02 – 2022-09-24 (×20): 20 mg via ORAL
  Filled 2022-09-02 (×24): qty 1

## 2022-09-02 NOTE — [facility] Counselor (Addendum)
CSW spoke with the patient's son on the patient's upcoming discharge.  Son expressed concerns about patient having someone with pt "24/7".  CSW explained that at this time there is no evidence of the need, however, CSW will reach out to the physician.  CSW has sent secure chat.  CSW attempted to contact the patient's ex-wife and left HIPAA compliant voicemail.  [name], MSW, LCSW 09/02/2022 4:29 PM    Patient requested to meet with CSW one on one.   Patient stated that he felt that he needed additional support in the home.  CSW asked what patient would need assistance with in the home.  Patient stated that he didn't know.    CSW reviewed with patient his ADL's, cooking and cleaning. Patient stated that he addresses all of these on his own in the hospital and when he is in the home.  CSW pointed out to the patient that if he is addressing these problems there is not a need for this service at discharge.  Patient reported that he had concerns for his medications.  CSW explained that physician would likely send the prescriptions to the patient's preferred pharmacy and patient would then pick the medication up.    Patient wanted reassurance that medication would not be in the blister packs and CSW informed that patient can speak about this with the physician.  He requested that CSW speak with ex-wife and/or son.   [name], MSW, LCSW 09/02/2022 4:13 PM

## 2022-09-02 NOTE — Group Note (Signed)
Date:  09/02/2022 Time:  4:14 PM  Group Topic/Focus:  Developing a Wellness Toolbox:   The focus of this group is to help patients develop a "wellness toolbox" with skills and strategies to promote recovery upon discharge.    Participation Level:  Active  Participation Quality:  Appropriate and Attentive  Affect:  Appropriate  Cognitive:  Alert, Appropriate, and Oriented  Insight: Good  Engagement in Group:  Engaged, Improving, and Supportive  Modes of Intervention:  Activity  Additional Comments:    Alexis Frock 09/02/2022, 4:14 PM

## 2022-09-02 NOTE — Progress Notes (Signed)
Hemodialysis note  Received patient in bed to unit. Alert and oriented.  Informed consent signed and in chart.  Treatment initiated: 0801 Treatment completed: 1138  Patient tolerated well. Transported back to room, alert without acute distress.  Report given to patient's RN.   Access used: LUA AVG Access issues: none  Total UF removed: 1L Medication(s) given:  Epogen 10 000 units IV  Post HD weight: 81.2 kg   Wolfgang Phoenix [facility]
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
## 2022-09-02 NOTE — Progress Notes (Signed)
Scheduled for dialysis today 6/13. NAEON  09/01/22 2300  Psych Admission Type (Psych Patients Only)  Admission Status Voluntary  Psychosocial Assessment  Patient Complaints Depression  Eye Contact Fair  Facial Expression Flat  Affect Flat  Speech Logical/coherent  Interaction Assertive  Motor Activity Slow  Appearance/Hygiene In scrubs  Behavior Characteristics Appropriate to situation  Mood Depressed  Thought Process  Coherency WDL  Content WDL  Delusions None reported or observed  Perception WDL  Hallucination None reported or observed  Judgment WDL  Confusion WDL  Danger to Self  Current suicidal ideation? Denies  Agreement Not to Harm Self Yes  Danger to Others  Danger to Others None reported or observed

## 2022-09-02 NOTE — Progress Notes (Signed)
Patient is A+O x4. He denies SI/HI/AVH. He denies anxiety and depression. Dialysis treatment received. Patient remains on I&O/1200 mL fluid restriction.  Tylenol 650 mg adm at 1415 for 8/10 headache pain. Upon follow up, pain decreased to 4.  Patient was provided warm water in a basin to aid in cleaning the mask of his CPAP machine.  Q15 minute unit checks in place.

## 2022-09-02 NOTE — Progress Notes (Signed)
   09/02/22 2200  Psych Admission Type (Psych Patients Only)  Admission Status Voluntary  Psychosocial Assessment  Patient Complaints None  Eye Contact Fair  Facial Expression Flat  Affect Flat  Speech Logical/coherent  Interaction Assertive  Motor Activity Slow  Appearance/Hygiene In scrubs  Behavior Characteristics Cooperative;Appropriate to situation  Mood Pleasant;Depressed  Thought Process  Coherency WDL  Content WDL  Delusions None reported or observed  Perception WDL  Hallucination None reported or observed  Judgment WDL  Confusion WDL  Danger to Self  Current suicidal ideation? Denies  Agreement Not to Harm Self Yes  Description of Agreement verbal  Danger to Others  Danger to Others None reported or observed

## 2022-09-02 NOTE — Progress Notes (Signed)
[facility]  ROUNDING NOTE   Subjective:   Patient seen and evaluated during dialysis   HEMODIALYSIS FLOWSHEET:  Blood Flow Rate (mL/min): 400 mL/min Arterial Pressure (mmHg): -190 mmHg Venous Pressure (mmHg): 240 mmHg TMP (mmHg): 6 mmHg Ultrafiltration Rate (mL/min): 543 mL/min Dialysate Flow Rate (mL/min): 300 ml/min Dialysis Fluid Bolus: Normal Saline Bolus Amount (mL): 100 mL  Tolerating treatment well Competed breakfast  Objective:  Vital signs in last 24 hours:  Temp:  [97.9 F (36.6 C)-100.2 F (37.9 C)] 97.9 F (36.6 C) (06/13 0734) Pulse Rate:  [63-85] 66 (06/13 1030) Resp:  [13-19] 13 (06/13 1030) BP: (115-142)/(60-83) 131/76 (06/13 1030) SpO2:  [97 %-100 %] 100 % (06/13 1030) Weight:  [81.9 kg] 81.9 kg (06/13 0734)  Weight change:  Filed Weights   08/31/22 0759 08/31/22 1338 09/02/22 0734  Weight: 81.8 kg 80.7 kg 81.9 kg    Intake/Output: No intake/output data recorded.   Intake/Output this shift:  No intake/output data recorded.  Physical Exam: General: NAD, seated in chair  Head: Normocephalic, atraumatic. Moist oral mucosal membranes  Eyes: Anicteric  Lungs:  Clear to auscultation  Heart: Regular rate  Abdomen:  Soft, nontender  Extremities:  no peripheral edema.  Neurologic: Nonfocal, moving all four extremities  Skin: No lesions  Access: Left AVG    Basic Metabolic Panel: Recent Labs  Lab 08/31/22 0830 09/02/22 0754  NA 138 138  K 3.6 3.7  CL 102 103  CO2 23 25  GLUCOSE 99 108*  BUN 73* 68*  CREATININE 5.84* 5.70*  CALCIUM 8.9 9.3  PHOS 3.9 2.9     Liver Function Tests: Recent Labs  Lab 08/31/22 0830 09/02/22 0754  ALBUMIN 3.6 3.6    No results for input(s): "LIPASE", "AMYLASE" in the last 168 hours. No results for input(s): "AMMONIA" in the last 168 hours.  CBC: Recent Labs  Lab 08/31/22 0830 09/02/22 0754  WBC 3.1* 3.6*  HGB 8.3* 8.5*  HCT 23.7* 24.5*  MCV 100.4* 99.6  PLT 115* 130*      Cardiac Enzymes: No results for input(s): "CKTOTAL", "CKMB", "CKMBINDEX", "TROPONINI" in the last 168 hours.  BNP: Invalid input(s): "POCBNP"  CBG: No results for input(s): "GLUCAP" in the last 168 hours.   Microbiology: Results for orders placed or performed in visit on 05/12/22  Microscopic Examination     Status: Abnormal   Collection Time: 05/12/22 10:39 AM   Urine  Result Value Ref Range Status   WBC, UA 6-10 (A) 0 - 5 /hpf Final   RBC, Urine 0-2 0 - 2 /hpf Final   Epithelial Cells (non renal) 0-10 0 - 10 /hpf Final   Bacteria, UA Moderate (A) None seen/Few Final    Coagulation Studies: No results for input(s): "LABPROT", "INR" in the last 72 hours.  Urinalysis: No results for input(s): "COLORURINE", "LABSPEC", "PHURINE", "GLUCOSEU", "HGBUR", "BILIRUBINUR", "KETONESUR", "PROTEINUR", "UROBILINOGEN", "NITRITE", "LEUKOCYTESUR" in the last 72 hours.  Invalid input(s): "APPERANCEUR"    Imaging: No results found.   Medications:      allopurinol  100 mg Oral BID   ARIPiprazole  15 mg Oral QPC breakfast   benazepril  10 mg Oral QPM   buPROPion  150 mg Oral Daily   cholecalciferol  1,000 Units Oral Daily   doxepin  100 mg Oral QHS   epoetin (EPOGEN/PROCRIT) injection  10,000 Units Intravenous Q T,Th,Sa-HD   feeding supplement (NEPRO CARB STEADY)  237 mL Oral Q24H   lamoTRIgine  25 mg Oral [id number]  loratadine  10 mg Oral Daily   potassium chloride  10 mEq Oral Daily   rosuvastatin  10 mg Oral Daily   senna-docusate  2 tablet Oral QPC breakfast   acetaminophen, alteplase, alum & mag hydroxide-simeth, droperidol, fentaNYL (SUBLIMAZE) injection, fluticasone, heparin, lidocaine (PF), lidocaine-prilocaine, loperamide, ondansetron (ZOFRAN) IV, pentafluoroprop-tetrafluoroeth, polyethylene glycol, promethazine, traZODone  Assessment/ Plan:  Luis Miller. is a 77 y.o. white male with end stage renal disease on hemodialysis, hypertension, gout, depression,  history of melanoma, crohn's, and CVA who presents to [facility] on 07/30/2022 for Bipolar depression (HCC) [F31.9]  [facility] [address] Left AVG 87kg  End Stage Renal Disease on hemodialysis:  Receiving treatment today, UF goal 1 L as tolerated. Next treatment scheduled for Saturday.   Anemia with chronic kidney disease: Hgb 8.5, Continue EPO with dialysis treatment  Hypertension with chronic kidney disease: Hypotensive on treatment today. Current regimen of amlodipine, benazepril. Patient did not get his medications this morning prior to dialysis. - Blood pressure stable  Secondary Hyperparathyroidism: outpatient PTH of 456.   Bone minerals remains acceptable.    LOS: 34   6/13/202410:41 AM

## 2022-09-02 NOTE — Group Note (Signed)
[facility] LCSW Group Therapy Note   Group Date: 09/02/2022 Start Time: 1300 End Time: 1400   Type of Therapy/Topic:  Group Therapy:  Balance in Life  Participation Level:  Active   Description of Group:    This group will address the concept of balance and how it feels and looks when one is unbalanced. Patients will be encouraged to process areas in their lives that are out of balance, and identify reasons for remaining unbalanced. Facilitators will guide patients utilizing problem- solving interventions to address and correct the stressor making their life unbalanced. Understanding and applying boundaries will be explored and addressed for obtaining  and maintaining a balanced life. Patients will be encouraged to explore ways to assertively make their unbalanced needs known to significant others in their lives, using other group members and facilitator for support and feedback.  Therapeutic Goals: Patient will identify two or more emotions or situations they have that consume much of in their lives. Patient will identify signs/triggers that life has become out of balance:  Patient will identify two ways to set boundaries in order to achieve balance in their lives:  Patient will demonstrate ability to communicate their needs through discussion and/or role plays  Summary of Patient Progress: Patient was present in group.  Patient was active and supportive.  Patient identified that he does not feel that he has made progress, however, CSW noted progress to patient.  Patient asked for 101 on meeting following group and CSW met with patient.  Patient was able to identify that he does not know what makes him joy, however, indicated activities that he could engage in.   Therapeutic Modalities:   Cognitive Behavioral Therapy Solution-Focused Therapy Assertiveness Training   Harden Mo, LCSW

## 2022-09-02 NOTE — Plan of Care (Signed)

## 2022-09-02 NOTE — Group Note (Deleted)
[facility] LCSW Group Therapy Note   Group Date: 09/02/2022 Start Time: 1300 End Time: 1400   Type of Therapy/Topic:  Group Therapy:  Balance in Life  Participation Level:  {B[facility] PARTICIPATION [id number]}   Description of Group:    This group will address the concept of balance and how it feels and looks when one is unbalanced. Patients will be encouraged to process areas in their lives that are out of balance, and identify reasons for remaining unbalanced. Facilitators will guide patients utilizing problem- solving interventions to address and correct the stressor making their life unbalanced. Understanding and applying boundaries will be explored and addressed for obtaining  and maintaining a balanced life. Patients will be encouraged to explore ways to assertively make their unbalanced needs known to significant others in their lives, using other group members and facilitator for support and feedback.  Therapeutic Goals: 1. Patient will identify two or more emotions or situations they have that consume much of in their lives. 2. Patient will identify signs/triggers that life has become out of balance:  3. Patient will identify two ways to set boundaries in order to achieve balance in their lives:  4. Patient will demonstrate ability to communicate their needs through discussion and/or role plays  Summary of Patient Progress:    ***    Therapeutic Modalities:   Cognitive Behavioral Therapy Solution-Focused Therapy Assertiveness Training   Harden Mo, LCSW

## 2022-09-02 NOTE — BH IP Treatment Plan (Signed)
Interdisciplinary Treatment and Diagnostic Plan Update  09/02/2022 Time of Session: 9:00AM Luis Miller. MRN: [id number]  Principal Diagnosis: Bipolar depression (HCC)  Secondary Diagnoses: Principal Problem:   Bipolar depression (HCC)   Current Medications:  Current Facility-Administered Medications  Medication Dose Route Frequency Provider Last Rate Last Admin   acetaminophen (TYLENOL) tablet 650 mg  650 mg Oral Q6H PRN Clapacs, [name], MD   650 mg at 09/02/22 1415   allopurinol (ZYLOPRIM) tablet 100 mg  100 mg Oral BID Clapacs, [name], MD   100 mg at 09/02/22 1414   alteplase (CATHFLO ACTIVASE) injection 2 mg  2 mg Intracatheter Once PRN Lateef, Munsoor, MD       alum & mag hydroxide-simeth (MAALOX/MYLANTA) 200-200-20 MG/5ML suspension 30 mL  30 mL Oral Q4H PRN Clapacs, [name], MD       ARIPiprazole (ABILIFY) tablet 15 mg  15 mg Oral QPC breakfast [name], DO   15 mg at 09/02/22 1414   benazepril (LOTENSIN) tablet 20 mg  20 mg Oral QPM Clapacs, [name], MD       buPROPion (WELLBUTRIN XL) 24 hr tablet 150 mg  150 mg Oral Daily Clapacs, [name], MD   150 mg at 09/02/22 1417   cholecalciferol (VITAMIN D3) 25 MCG (1000 UNIT) tablet 1,000 Units  1,000 Units Oral Daily Clapacs, Jackquline Denmark, MD   1,000 Units at 09/02/22 1414   doxepin (SINEQUAN) capsule 100 mg  100 mg Oral QHS Clapacs, [name], MD   100 mg at 09/01/22 2134   droperidol (INAPSINE) 2.5 MG/ML injection 0.625 mg  0.625 mg Intravenous Once PRN Yevette Edwards, MD       epoetin alfa (EPOGEN) injection 10,000 Units  10,000 Units Intravenous Q T,Th,Sa-HD Wendee Beavers, NP   10,000 Units at 09/02/22 1048   feeding supplement (NEPRO CARB STEADY) liquid 237 mL  237 mL Oral Q24H Thedore Mins, Harmeet, MD   237 mL at 09/01/22 1210   fentaNYL (SUBLIMAZE) injection 25 mcg  25 mcg Intravenous Q5 min PRN Darleene Cleaver, Gijsbertus F, MD       fluticasone (FLONASE) 50 MCG/ACT nasal spray 2 spray  2 spray Each Nare Daily PRN Clapacs, John  T, MD       heparin injection 1,000 Units  1,000 Units Dialysis PRN Cherylann Ratel, Munsoor, MD       lamoTRIgine (LAMICTAL) tablet 25 mg  25 mg Oral [id number] Herrick, Richard Edward, DO   25 mg at 09/01/22 1659   lidocaine (PF) (XYLOCAINE) 1 % injection 5 mL  5 mL Intradermal PRN Lateef, Munsoor, MD       lidocaine-prilocaine (EMLA) cream 1 Application  1 Application Topical PRN Lateef, Munsoor, MD   1 Application at 08/12/22 0700   loperamide (IMODIUM) capsule 2 mg  2 mg Oral PRN Clapacs, Jackquline Denmark, MD   2 mg at 08/28/22 1711   loratadine (CLARITIN) tablet 10 mg  10 mg Oral Daily Clapacs, [name], MD   10 mg at 09/02/22 1414   ondansetron (ZOFRAN) injection 4 mg  4 mg Intravenous Once PRN Darleene Cleaver, Gerrit Heck, MD       pentafluoroprop-tetrafluoroeth (GEBAUERS) aerosol 1 Application  1 Application Topical PRN Lateef, Munsoor, MD       polyethylene glycol (MIRALAX / GLYCOLAX) packet 17 g  17 g Oral BID PRN Reggie Pile, MD   17 g at 08/23/22 1702   potassium chloride (KLOR-CON M) CR tablet 10 mEq  10 mEq Oral  Daily [name], DO   10 mEq at 09/02/22 1414   promethazine (PHENERGAN) injection 6.25-12.5 mg  6.25-12.5 mg Intravenous Q15 min PRN Yevette Edwards, MD       rosuvastatin (CRESTOR) tablet 10 mg  10 mg Oral Daily Clapacs, Jackquline Denmark, MD   10 mg at 09/02/22 1414   senna-docusate (Senokot-S) tablet 2 tablet  2 tablet Oral QPC breakfast [name], DO   2 tablet at 09/02/22 1417   traZODone (DESYREL) tablet 50 mg  50 mg Oral QHS PRN [name], DO   50 mg at 09/01/22 2134   PTA Medications: Medications Prior to Admission  Medication Sig Dispense Refill Last Dose   amLODipine (NORVASC) 10 MG tablet TAKE 1 TABLET DAILY 90 tablet 1 08/19/2022   benazepril (LOTENSIN) 40 MG tablet Take 40 mg by mouth daily.    08/19/2022   calcitRIOL (ROCALTROL) 0.25 MCG capsule Take 0.25 mcg by mouth daily.   08/19/2022   FLUoxetine (PROZAC) 20 MG capsule Take 1 capsule (20 mg total) by  mouth daily. 90 capsule 0 08/19/2022   fluticasone (FLONASE) 50 MCG/ACT nasal spray Place 2 sprays into both nostrils daily. 16 g 6 08/19/2022   hydrALAZINE (APRESOLINE) 100 MG tablet Take 100 mg by mouth 3 (three) times daily.   08/19/2022   lamoTRIgine (LAMICTAL) 25 MG tablet TAKE 1 TABLET DAILY 90 tablet 0 08/19/2022   loperamide (IMODIUM) 2 MG capsule Take 2 mg by mouth daily as needed for diarrhea or loose stools.   08/19/2022   loratadine (CLARITIN) 10 MG tablet TAKE 1 TABLET BY MOUTH EVERY DAY 90 tablet 1 08/19/2022   Multiple Vitamin (MULTIVITAMIN) tablet Take 1 tablet by mouth daily.   08/19/2022   OLANZapine (ZYPREXA) 5 MG tablet Take 1 tablet (5 mg total) by mouth at bedtime. 90 tablet 0 08/19/2022   rosuvastatin (CRESTOR) 10 MG tablet TAKE 1 TABLET BY MOUTH EVERY DAY 90 tablet 0 08/19/2022   Vitamin D, Cholecalciferol, 25 MCG (1000 UT) TABS Take 1,000 tablets by mouth daily.    08/19/2022   allopurinol (ZYLOPRIM) 100 MG tablet Take 100 mg by mouth 2 (two) times daily.       Patient Stressors: Medication change or noncompliance    Patient Strengths: Ability for insight  Average or above average intelligence  Capable of independent living  Communication skills  Motivation for treatment/growth  Supportive family/friends   Treatment Modalities: Medication Management, Group therapy, Case management,  1 to 1 session with clinician, Psychoeducation, Recreational therapy.   Physician Treatment Plan for Primary Diagnosis: Bipolar depression (HCC) Long Term Goal(s): Improvement in symptoms so as ready for discharge   Short Term Goals: Compliance with prescribed medications will improve Ability to verbalize feelings will improve  Medication Management: Evaluate patient's response, side effects, and tolerance of medication regimen.  Therapeutic Interventions: 1 to 1 sessions, Unit Group sessions and Medication administration.  Evaluation of Outcomes: Progressing  Physician Treatment Plan  for Secondary Diagnosis: Principal Problem:   Bipolar depression (HCC)  Long Term Goal(s): Improvement in symptoms so as ready for discharge   Short Term Goals: Compliance with prescribed medications will improve Ability to verbalize feelings will improve     Medication Management: Evaluate patient's response, side effects, and tolerance of medication regimen.  Therapeutic Interventions: 1 to 1 sessions, Unit Group sessions and Medication administration.  Evaluation of Outcomes: Progressing   RN Treatment Plan for Primary Diagnosis: Bipolar depression (HCC) Long Term Goal(s): Knowledge of disease and  therapeutic regimen to maintain health will improve  Short Term Goals: Ability to demonstrate self-control, Ability to participate in decision making will improve, Ability to verbalize feelings will improve, Ability to disclose and discuss suicidal ideas, Ability to identify and develop effective coping behaviors will improve, and Compliance with prescribed medications will improve  Medication Management: RN will administer medications as ordered by provider, will assess and evaluate patient's response and provide education to patient for prescribed medication. RN will report any adverse and/or side effects to prescribing provider.  Therapeutic Interventions: 1 on 1 counseling sessions, Psychoeducation, Medication administration, Evaluate responses to treatment, Monitor vital signs and CBGs as ordered, Perform/monitor CIWA, COWS, AIMS and Fall Risk screenings as ordered, Perform wound care treatments as ordered.  Evaluation of Outcomes: Progressing   LCSW Treatment Plan for Primary Diagnosis: Bipolar depression (HCC) Long Term Goal(s): Safe transition to appropriate next level of care at discharge, Engage patient in therapeutic group addressing interpersonal concerns.  Short Term Goals: Engage patient in aftercare planning with referrals and resources, Increase social support, Increase  ability to appropriately verbalize feelings, Increase emotional regulation, Facilitate acceptance of mental health diagnosis and concerns, and Increase skills for wellness and recovery  Therapeutic Interventions: Assess for all discharge needs, 1 to 1 time with Social worker, Explore available resources and support systems, Assess for adequacy in community support network, Educate family and significant other(s) on suicide prevention, Complete Psychosocial Assessment, Interpersonal group therapy.  Evaluation of Outcomes: Progressing   Progress in Treatment: Attending groups: Yes. Participating in groups: Yes. Taking medication as prescribed: Yes. Toleration medication: Yes. Family/Significant other contact made: Yes, individual(s) contacted:  SPE completed with the patient's wife Patient understands diagnosis: No. Discussing patient identified problems/goals with staff: Yes. Medical problems stabilized or resolved: Yes. Denies suicidal/homicidal ideation: Yes. Issues/concerns per patient self-inventory: No. Other: none  New problem(s) identified: No, Describe:  none   New Short Term/Long Term Goal(s): Patient to work towards medication management for mood stabilization; elimination of SI thoughts; development of comprehensive mental wellness plan. Update 6/8: No update at this time.  Update 09/02/2022: No changes at this time.   Patient Goals:   No additional goals identified at this time. Patient to continue to work towards original goals identified in initial treatment team meeting. CSW will remain available to patient should they voice additional treatment goals. Update 6/8: The patient has not identified any new goals at this time. Update 09/02/2022: No changes at this time.   Discharge Plan or Barriers: No psychosocial barriers identified at this time, patient to return to place of residence when appropriate for discharge. Update 6/8: There are no new barriers identified at this time.  The patient will return home, the doctor has not provided a discharge date as of yet. Update 09/02/2022: CSW notes progress from patient. However,  patient does not acknowledge his progress.  Patient reports that he needs help at home, however, is not able to identify what support is needed.  The things identified patient was able to acknowledge that he addresses those things on his own at home and at the hospital.     Reason for Continuation of Hospitalization: Depression Medication stabilization   Estimated Length of Stay: 1-7 days Update 6/8: TBD Update 09/02/2022: TBD  Last 3 [place] Suicide Severity Risk Score: Flowsheet Row Admission (Current) from 07/30/2022 in [facility] BE[facility]IORAL [facility] Most recent reading at 08/23/2022 12:20 PM ED from 07/30/2022 in [facility] Emergency Department at [facility] Most recent reading at 07/30/2022  3:10 PM Office Visit from 07/26/2022 in confidential department Most recent reading at 07/27/2022 11:27 AM  C-SSRS RISK CATEGORY No Risk No Risk No Risk       Last PHQ 2/9 Scores:    07/26/2022    9:10 AM 02/24/2022    1:14 PM 01/25/2022    1:34 PM  Depression screen PHQ 2/9  Decreased Interest 1 0 0  Down, Depressed, Hopeless 1 0 0  PHQ - 2 Score 2 0 0  Altered sleeping 0 0 0  Tired, decreased energy 0 0 0  Change in appetite 0 0 0  Feeling bad or failure about yourself  1 0 0  Trouble concentrating 0 0 0  Moving slowly or fidgety/restless 0 0 0  Suicidal thoughts 0 0 0  PHQ-9 Score 3 0 0  Difficult doing work/chores  Not difficult at all     Scribe for Treatment Team: Harden Mo, LCSW 09/02/2022 4:06 PM

## 2022-09-03 DIAGNOSIS — F319 Bipolar disorder, unspecified: Secondary | ICD-10-CM | POA: Diagnosis not present

## 2022-09-03 NOTE — Progress Notes (Signed)
   09/03/22 0600  15 Minute Checks  Location Bedroom  Visual Appearance Calm  Behavior Sleeping  Sleep (Behavioral Health Patients Only)  Calculate sleep? (Click Yes once per 24 hr at 0600 safety check) Yes  Documented sleep last 24 hours 7.5

## 2022-09-03 NOTE — Progress Notes (Signed)
   09/03/22 2300  Psych Admission Type (Psych Patients Only)  Admission Status Voluntary  Psychosocial Assessment  Patient Complaints None  Eye Contact Fair  Facial Expression Flat  Affect Flat  Speech Logical/coherent  Interaction Assertive  Motor Activity Slow  Appearance/Hygiene In scrubs  Behavior Characteristics Cooperative  Mood Pleasant  Thought Process  Coherency WDL  Content WDL  Delusions None reported or observed  Perception WDL  Hallucination None reported or observed  Judgment WDL  Confusion None  Danger to Self  Current suicidal ideation? Denies  Agreement Not to Harm Self Yes  Description of Agreement verbal  Danger to Others  Danger to Others None reported or observed

## 2022-09-03 NOTE — Group Note (Signed)
Date:  09/03/2022 Time:  6:08 PM  Group Topic/Focus:  Music therapy/ outside therapeutic games. Teamwork, social skills     Participation Level:  Active  Participation Quality:  Appropriate  Affect:  Appropriate  Cognitive:  Alert and Appropriate  Insight: Appropriate  Engagement in Group:  Engaged  Modes of Intervention:  Activity  Additional Comments:    Doug Sou 09/03/2022, 6:08 PM

## 2022-09-03 NOTE — Group Note (Signed)
Date:  09/03/2022 Time:  2:37 AM  Group Topic/Focus:  Wrap-Up Group:   The focus of this group is to help patients review their daily goal of treatment and discuss progress on daily workbooks.    Participation Level:  Active  Participation Quality:  Attentive  Affect:  Appropriate  Cognitive:  Appropriate  Insight: Good  Engagement in Group:  Engaged  Modes of Intervention:  Discussion  Additional Comments:  Discussed how patients day was.  Roberto Scales 09/03/2022, 2:37 AM

## 2022-09-03 NOTE — BHH Counselor (Signed)
CSW attempted to contact patient's ex-wife.  CSW left HIPAA compliant voicemail. Luis Miller [phone]  CSW called patient's son and informed him that the expected discharge had been canceled.  CSW pointed out that physician is aware of patient's request for home health aide referral.  CSW encouraged family to update patient on the supports the family has identified that are willing to help as patient does not think he has any. [name], son, [phone]   [name], MSW, LCSW 09/03/2022 3:27 PM
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
## 2022-09-03 NOTE — Group Note (Signed)
Date:  09/03/2022 Time:  8:56 PM  Group Topic/Focus:  Identifying Needs:   The focus of this group is to help patients identify their personal needs that have been historically problematic and identify healthy behaviors to address their needs.    Participation Level:  Active  Participation Quality:  Appropriate  Affect:  Appropriate  Cognitive:  Appropriate  Insight: Good  Engagement in Group:  Engaged  Modes of Intervention:  Education  Additional Comments:    Garry Heater 09/03/2022, 8:56 PM

## 2022-09-03 NOTE — Progress Notes (Signed)
Patient is alert and oriented. He denies SI/HI/AVH. He denies anxiety and depression. Patient was calm and cooperative and enjoys interacting with other patients on the unit.  Dialysis scheduled for tomorrow 06.15.24. No PRN's given.  Q15 minute unit checks in place.

## 2022-09-04 DIAGNOSIS — F319 Bipolar disorder, unspecified: Secondary | ICD-10-CM | POA: Diagnosis not present

## 2022-09-04 LAB — CBC
HCT: 25.4 % — ABNORMAL LOW (ref 39.0–52.0)
Hemoglobin: 8.7 g/dL — ABNORMAL LOW (ref 13.0–17.0)
MCH: 34.9 pg — ABNORMAL HIGH (ref 26.0–34.0)
MCHC: 34.3 g/dL (ref 30.0–36.0)
MCV: 102 fL — ABNORMAL HIGH (ref 80.0–100.0)
Platelets: 146 10*3/uL — ABNORMAL LOW (ref 150–400)
RBC: 2.49 MIL/uL — ABNORMAL LOW (ref 4.22–5.81)
RDW: 13.3 % (ref 11.5–15.5)
WBC: 3.4 10*3/uL — ABNORMAL LOW (ref 4.0–10.5)
nRBC: 0 % (ref 0.0–0.2)

## 2022-09-04 LAB — RENAL FUNCTION PANEL
Albumin: 3.8 g/dL (ref 3.5–5.0)
Anion gap: 13 (ref 5–15)
BUN: 57 mg/dL — ABNORMAL HIGH (ref 8–23)
CO2: 25 mmol/L (ref 22–32)
Calcium: 9.1 mg/dL (ref 8.9–10.3)
Chloride: 99 mmol/L (ref 98–111)
Creatinine, Ser: 5.15 mg/dL — ABNORMAL HIGH (ref 0.61–1.24)
GFR, Estimated: 11 mL/min — ABNORMAL LOW (ref >60)
Glucose, Bld: 185 mg/dL — ABNORMAL HIGH (ref 70–99)
Phosphorus: 2.3 mg/dL — ABNORMAL LOW (ref 2.5–4.6)
Potassium: 3.5 mmol/L (ref 3.5–5.1)
Sodium: 137 mmol/L (ref 135–145)

## 2022-09-04 MED ORDER — EPOETIN ALFA 10000 UNIT/ML IJ SOLN
INTRAMUSCULAR | Status: AC
  Filled 2022-09-04: qty 1

## 2022-09-04 NOTE — Progress Notes (Signed)
[facility]  Dialysis Note   Subjective:   Seen and examined on hemodialysis treatment.  Feels better.   HEMODIALYSIS FLOWSHEET:  Blood Flow Rate (mL/min): 400 mL/min Arterial Pressure (mmHg): -200 mmHg Venous Pressure (mmHg): 280 mmHg TMP (mmHg): 8 mmHg Ultrafiltration Rate (mL/min): 480 mL/min Dialysate Flow Rate (mL/min): 300 ml/min Dialysis Fluid Bolus: Normal Saline Bolus Amount (mL): 100 mL    Objective:  Vital signs in last 24 hours:  Temp:  [98.3 F (36.8 C)-99.1 F (37.3 C)] 98.3 F (36.8 C) (06/15 0829) Pulse Rate:  [59-87] 63 (06/15 1130) Resp:  [14-16] 16 (06/15 1130) BP: (118-155)/(61-89) 155/73 (06/15 1130) SpO2:  [98 %-100 %] 100 % (06/15 1130) Weight:  [82.1 kg] 82.1 kg (06/15 0829)  Weight change:  Filed Weights   09/02/22 0734 09/02/22 1204 09/04/22 0829  Weight: 81.9 kg 81.2 kg 82.1 kg    Intake/Output: I/O last 3 completed shifts: In: 960 [P.O.:960] Out: 850 [Urine:850]   Intake/Output this shift:  Total I/O In: 240 [P.O.:240] Out: 700 [Urine:700]  Physical Exam: General: NAD,   Head: Normocephalic, atraumatic. Moist oral mucosal membranes  Eyes: Anicteric, PERRL  Neck: Supple, trachea midline  Lungs:  Clear to auscultation  Heart: Regular rate and rhythm  Abdomen:  Soft, nontender,   Extremities:  peripheral edema.  Neurologic: Nonfocal, moving all four extremities  Skin: No lesions  Access:     Basic Metabolic Panel: Recent Labs  Lab 08/31/22 0830 09/02/22 0754 09/04/22 0858  NA 138 138 137  K 3.6 3.7 3.5  CL 102 103 99  CO2 23 25 25   GLUCOSE 99 108* 185*  BUN 73* 68* 57*  CREATININE 5.84* 5.70* 5.15*  CALCIUM 8.9 9.3 9.1  PHOS 3.9 2.9 2.3*    Liver Function Tests: Recent Labs  Lab 08/31/22 0830 09/02/22 0754 09/04/22 0858  ALBUMIN 3.6 3.6 3.8   No results for input(s): "LIPASE", "AMYLASE" in the last 168 hours. No results for input(s): "AMMONIA" in the last 168 hours.  CBC: Recent Labs  Lab  08/31/22 0830 09/02/22 0754 09/04/22 0858  WBC 3.1* 3.6* 3.4*  HGB 8.3* 8.5* 8.7*  HCT 23.7* 24.5* 25.4*  MCV 100.4* 99.6 102.0*  PLT 115* 130* 146*    Cardiac Enzymes: No results for input(s): "CKTOTAL", "CKMB", "CKMBINDEX", "TROPONINI" in the last 168 hours.  BNP: Invalid input(s): "POCBNP"  CBG: No results for input(s): "GLUCAP" in the last 168 hours.  Microbiology: Results for orders placed or performed in visit on 05/12/22  Microscopic Examination     Status: Abnormal   Collection Time: 05/12/22 10:39 AM   Urine  Result Value Ref Range Status   WBC, UA 6-10 (A) 0 - 5 /hpf Final   RBC, Urine 0-2 0 - 2 /hpf Final   Epithelial Cells (non renal) 0-10 0 - 10 /hpf Final   Bacteria, UA Moderate (A) None seen/Few Final    Coagulation Studies: No results for input(s): "LABPROT", "INR" in the last 72 hours.  Urinalysis: No results for input(s): "COLORURINE", "LABSPEC", "PHURINE", "GLUCOSEU", "HGBUR", "BILIRUBINUR", "KETONESUR", "PROTEINUR", "UROBILINOGEN", "NITRITE", "LEUKOCYTESUR" in the last 72 hours.  Invalid input(s): "APPERANCEUR"    Imaging: No results found.   Medications:     allopurinol  100 mg Oral BID   ARIPiprazole  15 mg Oral QPC breakfast   benazepril  20 mg Oral QPM   buPROPion  150 mg Oral Daily   cholecalciferol  1,000 Units Oral Daily   doxepin  100 mg Oral QHS  epoetin (EPOGEN/PROCRIT) injection  10,000 Units Intravenous Q T,Th,Sa-HD   feeding supplement (NEPRO CARB STEADY)  237 mL Oral Q24H   lamoTRIgine  25 mg Oral [id number]   loratadine  10 mg Oral Daily   potassium chloride  10 mEq Oral Daily   rosuvastatin  10 mg Oral Daily   senna-docusate  2 tablet Oral QPC breakfast   acetaminophen, alteplase, alum & mag hydroxide-simeth, droperidol, fentaNYL (SUBLIMAZE) injection, fluticasone, heparin, lidocaine (PF), lidocaine-prilocaine, loperamide, ondansetron (ZOFRAN) IV, pentafluoroprop-tetrafluoroeth, polyethylene glycol, promethazine,  traZODone  Assessment/ Plan:  [name]. is a 77 y.o.  male white male with end stage renal disease on hemodialysis, hypertension, gout, depression, history of melanoma, crohn's, and CVA who presents to [facility] on 07/30/2022 for Bipolar depression (HCC) [F31.9]   Principal Problem:   Bipolar depression (HCC) End-stage renal disease.    End Stage Renal Disease on hemodialysis: Tolerating dialysis well.  Potassium [facility] vascular lab)  Date Value Ref Range Status  09/28/2021 3.6 3.5 - 5.1 mmol/L Final    Comment:    Performed at [facility], [address] [place], [place] [address]    Intake/Output Summary (Last 24 hours) at 09/04/2022 1157 Last data filed at 09/04/2022 0800 Gross per 24 hour  Intake 960 ml  Output 950 ml  Net 10 ml    2. Hypertension with chronic kidney disease: Blood pressure is acceptable.   BP (!) 155/73 (BP Location: Right Arm)   Pulse 63   Temp 98.3 F (36.8 C) (Oral)   Resp 16   Ht 5\' 6"  (1.676 m)   Wt 82.1 kg   SpO2 100%   BMI 29.21 kg/m   3. Anemia of chronic kidney disease/ kidney injury/chronic disease/acute blood loss:   Lab Results  Component Value Date   HGB 8.7 (L) 09/04/2022   Continue anemia protocol.  4. Secondary Hyperparathyroidism:     Lab Results  Component Value Date   CALCIUM 9.1 09/04/2022   CAION 1.17 11/05/2021   PHOS 2.3 (L) 09/04/2022   Will continue to monitor.  Medications reviewed.     LOS: 36 Lorain Childes, MD [facility] 6/15/202411:57 AM

## 2022-09-04 NOTE — Progress Notes (Signed)
Patient is alert and oriented x 4. Denies SI/HI/AVH. Endorses mild depression and anxiety. Patient received dialysis today. He remains on strict I's and O's with 1200 mL restriction.   CBC and renal function panel completed. Discharge planning ongoing.  Q15 minute unit checks in place.
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
## 2022-09-04 NOTE — Progress Notes (Signed)
   09/04/22 0550  15 Minute Checks  Location Bedroom  Visual Appearance Calm  Behavior Sleeping  Sleep (Behavioral Health Patients Only)  Calculate sleep? (Click Yes once per 24 hr at 0600 safety check) Yes  Documented sleep last 24 hours 7.75

## 2022-09-04 NOTE — Group Note (Signed)
Date:  09/04/2022 Time:  9:38 PM  Group Topic/Focus:  Recovery Goals:   The focus of this group is to identify appropriate goals for recovery and establish a plan to achieve them.    Participation Level:  Active  Participation Quality:  Appropriate  Affect:  Appropriate  Cognitive:  Appropriate  Insight: Good  Engagement in Group:  Engaged  Modes of Intervention:  Support  Additional Comments:    Garry Heater 09/04/2022, 9:38 PM

## 2022-09-04 NOTE — [facility] Group Notes (Signed)
[facility] Group Notes:  (Nursing/MHT/Case Management/Adjunct)  Date:  09/04/2022  Time:  9:54 AM  Type of Therapy:  Movement Therapy  Participation Level:  Did Not Attend    Luis Miller 09/04/2022, 9:54 AM

## 2022-09-04 NOTE — Progress Notes (Signed)
Hemodialysis note  Received patient in bed to unit. Alert and oriented.  Informed consent signed and in chart.  Treatment initiated: 0981 Treatment completed: 1220  Patient tolerated well. Transported back to room, alert without acute distress.  Report given to patient's RN.   Access used: LUA AVG Access issues: none  Total UF removed: 1L Medication(s) given:  Epopgen 10000 units IV  Post HD weight: 80.9 kg   Wolfgang Phoenix [facility]

## 2022-09-05 DIAGNOSIS — F319 Bipolar disorder, unspecified: Secondary | ICD-10-CM | POA: Diagnosis not present

## 2022-09-05 MED ORDER — TRAZODONE HCL 100 MG PO TABS
100.0000 mg | ORAL_TABLET | Freq: Every day | ORAL | Status: DC
  Administered 2022-09-05 – 2022-09-23 (×19): 100 mg via ORAL
  Filled 2022-09-05 (×19): qty 1

## 2022-09-05 NOTE — Progress Notes (Signed)
Pt has been pleasant and social with staff and peers. He is compliant with medications. Pt denies any S/I or H/I or AVH. Pt has spent much of shift in dayroom ,watching tv with peers.

## 2022-09-05 NOTE — Plan of Care (Signed)
D- Patient alert and oriented. Affect/mood. Denies SI, HI, AVH, and pain. Pt is complaint with staff requests,takes medication.Pleasant and cooperative   A- Scheduled medications administered to patient, per MD orders. Support and encouragement provided.  Routine safety checks conducted every 15 minutes.  Patient informed to notify staff with problems or concerns.  R- No adverse drug reactions noted. Patient contracts for safety at this time. Patient compliant with medications and treatment plan. Patient receptive, calm, and cooperative. Patient interacts well with others on the unit.  Patient remains safe at this time.
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
## 2022-09-05 NOTE — Group Note (Signed)
Date:  09/05/2022 Time:  12:26 PM  Group Topic/Focus:  Dimensions of Wellness:   The focus of this group is to introduce the topic of wellness and discuss the role each dimension of wellness plays in total health. Self Care:   The focus of this group is to help patients understand the importance of self-care in order to improve or restore emotional, physical, spiritual, interpersonal, and financial health. Who is our support system    Participation Level:  Active  Participation Quality:  Appropriate  Affect:  Appropriate  Cognitive:  Alert and Oriented  Insight: Good  Engagement in Group:  Engaged  Modes of Intervention:  Discussion  Additional Comments:    Leonie Green 09/05/2022, 12:26 PM

## 2022-09-05 NOTE — Progress Notes (Signed)
   09/05/22 0100  Psych Admission Type (Psych Patients Only)  Admission Status Voluntary  Psychosocial Assessment  Patient Complaints None  Eye Contact Fair  Facial Expression Flat  Affect Flat  Speech Logical/coherent  Interaction Assertive  Motor Activity Slow  Appearance/Hygiene In scrubs  Behavior Characteristics Cooperative  Mood Sad  Thought Process  Coherency WDL  Content WDL  Delusions None reported or observed  Perception WDL  Hallucination None reported or observed  Judgment WDL  Confusion WDL  Danger to Self  Current suicidal ideation? Denies  Agreement Not to Harm Self Yes  Description of Agreement verbal  Danger to Others  Danger to Others None reported or observed

## 2022-09-05 NOTE — Group Note (Signed)
LCSW Group Therapy Note   Group Date: 09/05/2022 Start Time: 1315 End Time: 1355   Type of Therapy and Topic:  Group Therapy: Geriatric Depression  Participation Level:  Active     Summary of Patient Progress:  The patient attended group. The patient participated in the icebreaker by answering that he received a red fire truck as a child that was special to him. The patient stated that he loses interest in things. The patient stated that he use to go dancing but stopped and he feels ashamed and don't want friends to see him. The patient stated that he has been having issues sleeping.    Marshell Levan, LCSWA 09/05/2022  2:08 PM

## 2022-09-05 NOTE — Group Note (Signed)
Date:  09/05/2022 Time:  11:30 PM  Group Topic/Focus:  Building Self Esteem:   The Focus of this group is helping patients become aware of the effects of self-esteem on their lives, the things they and others do that enhance or undermine their self-esteem, seeing the relationship between their level of self-esteem and the choices they make and learning ways to enhance self-esteem. Coping With Mental Health Crisis:   The purpose of this group is to help patients identify strategies for coping with mental health crisis.  Group discusses possible causes of crisis and ways to manage them effectively. Developing a Wellness Toolbox:   The focus of this group is to help patients develop a "wellness toolbox" with skills and strategies to promote recovery upon discharge. Goals Group:   The focus of this group is to help patients establish daily goals to achieve during treatment and discuss how the patient can incorporate goal setting into their daily lives to aide in recovery. Healthy Communication:   The focus of this group is to discuss communication, barriers to communication, as well as healthy ways to communicate with others. Making Healthy Choices:   The focus of this group is to help patients identify negative/unhealthy choices they were using prior to admission and identify positive/healthier coping strategies to replace them upon discharge. Managing Feelings:   The focus of this group is to identify what feelings patients have difficulty handling and develop a plan to handle them in a healthier way upon discharge. Overcoming Stress:   The focus of this group is to define stress and help patients assess their triggers. Personal Choices and Values:   The focus of this group is to help patients assess and explore the importance of values in their lives, how their values affect their decisions, how they express their values and what opposes their expression.    Participation Level:   Active  Participation Quality:  Appropriate  Affect:  Appropriate  Cognitive:  Alert  Insight: Appropriate  Engagement in Group:  Engaged  Modes of Intervention:  Discussion  Additional Comments:    Luis Miller 09/05/2022, 11:30 PM

## 2022-09-06 DIAGNOSIS — F319 Bipolar disorder, unspecified: Secondary | ICD-10-CM | POA: Diagnosis not present

## 2022-09-06 NOTE — Group Note (Signed)
Date:  09/06/2022 Time:  10:34 PM  Group Topic/Focus:  Building Self Esteem:   The Focus of this group is helping patients become aware of the effects of self-esteem on their lives, the things they and others do that enhance or undermine their self-esteem, seeing the relationship between their level of self-esteem and the choices they make and learning ways to enhance self-esteem. Coping With Mental Health Crisis:   The purpose of this group is to help patients identify strategies for coping with mental health crisis.  Group discusses possible causes of crisis and ways to manage them effectively. Developing a Wellness Toolbox:   The focus of this group is to help patients develop a "wellness toolbox" with skills and strategies to promote recovery upon discharge. Goals Group:   The focus of this group is to help patients establish daily goals to achieve during treatment and discuss how the patient can incorporate goal setting into their daily lives to aide in recovery. Healthy Communication:   The focus of this group is to discuss communication, barriers to communication, as well as healthy ways to communicate with others. Identifying Needs:   The focus of this group is to help patients identify their personal needs that have been historically problematic and identify healthy behaviors to address their needs. Making Healthy Choices:   The focus of this group is to help patients identify negative/unhealthy choices they were using prior to admission and identify positive/healthier coping strategies to replace them upon discharge. Managing Feelings:   The focus of this group is to identify what feelings patients have difficulty handling and develop a plan to handle them in a healthier way upon discharge. Personal Choices and Values:   The focus of this group is to help patients assess and explore the importance of values in their lives, how their values affect their decisions, how they express their values  and what opposes their expression. Rediscovering Joy:   The focus of this group is to explore various ways to relieve stress in a positive manner.    Participation Level:  Active  Participation Quality:  Appropriate  Affect:  Appropriate  Cognitive:  Alert  Insight: Appropriate  Engagement in Group:  Engaged, Improving, and Supportive  Modes of Intervention:  Discussion, Education, and Support  Additional Comments:    Luis Miller 09/06/2022, 10:34 PM

## 2022-09-06 NOTE — Group Note (Signed)
Recreation Therapy Group Note   Group Topic:Stress Management  Group Date: 09/06/2022 Start Time: 1400 End Time: 1500 Facilitators: Rosina Lowenstein, LRT, CTRS Location: Dayroom  Group Description: Stress Charades. Patients and LRT discuss the emotion "stress" and things that are associated with it. LRT encourages pts to make their own list of things that stress them out on a piece of paper provided. LRT encourages pts to identify their top 5 stressors from original list and has pts write them on smaller cut slips of paper. LRT folds and places slips into a container. Pt pulls a random slip out of the container and acts it out. LRT and peers try to guess what the peer is acting out. After all slips of paper are acted out, LRT and pts will discuss different coping skills that help them when they're feeling stressed.   Goal Area(s) Addressed: Patient will identify things that make them feel stressed.  Patient will visualize stressors by acting them out.  Patient will recognize that others experience stress. Patient will become more self-aware of stressors. Patient will discuss and learn alternative coping strategies for handling stress.  Affect/Mood: Appropriate   Participation Level: Active and Engaged   Participation Quality: Independent   Behavior: Calm and Cooperative   Speech/Thought Process: Coherent   Insight: Good   Judgement: Good   Modes of Intervention: Activity   Patient Response to Interventions:  Attentive, Engaged, Interested , and Receptive   Education Outcome:  Acknowledges education   Clinical Observations/Individualized Feedback: Luis Miller was active in their participation of session activities and group discussion. Pt identified "hearing, drivers license renewal and home" as things that stress him out. Pt appropriately played multiple rounds of charades with peers. Pt interacted well duration of session.   Plan: Continue to engage patient in RT group sessions  2-3x/week.   Rosina Lowenstein, LRT, CTRS 09/06/2022 3:16 PM

## 2022-09-06 NOTE — Progress Notes (Signed)
   09/06/22 2300  Psych Admission Type (Psych Patients Only)  Admission Status Voluntary  Psychosocial Assessment  Patient Complaints Depression  Eye Contact Fair  Facial Expression Flat  Affect Depressed  Speech Logical/coherent  Interaction Assertive  Motor Activity Slow  Appearance/Hygiene In scrubs  Behavior Characteristics Cooperative  Mood Depressed  Thought Process  Coherency WDL  Content WDL  Delusions None reported or observed  Perception WDL  Hallucination None reported or observed  Judgment WDL  Confusion WDL  Danger to Self  Current suicidal ideation? Denies

## 2022-09-06 NOTE — Progress Notes (Signed)
   09/06/22 0551  Psych Admission Type (Psych Patients Only)  Admission Status Voluntary  Psychosocial Assessment  Patient Complaints Depression  Eye Contact Fair  Facial Expression Flat  Affect Depressed  Speech Logical/coherent  Interaction Assertive  Motor Activity Slow  Appearance/Hygiene In scrubs  Behavior Characteristics Cooperative  Mood Depressed  Thought Process  Coherency WDL  Content WDL  Delusions None reported or observed  Perception WDL  Hallucination None reported or observed  Judgment WDL  Confusion WDL  Danger to Self  Current suicidal ideation? Denies

## 2022-09-06 NOTE — Progress Notes (Signed)
Patient denies SI, HI, and AVH. He endorses anxiety and feeling out of sorts. He is visible in the milieu and attends group activities. He is compliant with scheduled medications and remains safe on the unit at this time.
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
## 2022-09-07 DIAGNOSIS — F319 Bipolar disorder, unspecified: Secondary | ICD-10-CM | POA: Diagnosis not present

## 2022-09-07 LAB — CBC
HCT: 25.3 % — ABNORMAL LOW (ref 39.0–52.0)
Hemoglobin: 8.9 g/dL — ABNORMAL LOW (ref 13.0–17.0)
MCH: 36 pg — ABNORMAL HIGH (ref 26.0–34.0)
MCHC: 35.2 g/dL (ref 30.0–36.0)
MCV: 102.4 fL — ABNORMAL HIGH (ref 80.0–100.0)
Platelets: 140 10*3/uL — ABNORMAL LOW (ref 150–400)
RBC: 2.47 MIL/uL — ABNORMAL LOW (ref 4.22–5.81)
RDW: 14.3 % (ref 11.5–15.5)
WBC: 3.3 10*3/uL — ABNORMAL LOW (ref 4.0–10.5)
nRBC: 0 % (ref 0.0–0.2)

## 2022-09-07 LAB — RENAL FUNCTION PANEL
Albumin: 3.9 g/dL (ref 3.5–5.0)
Anion gap: 11 (ref 5–15)
BUN: 66 mg/dL — ABNORMAL HIGH (ref 8–23)
CO2: 24 mmol/L (ref 22–32)
Calcium: 8.9 mg/dL (ref 8.9–10.3)
Chloride: 102 mmol/L (ref 98–111)
Creatinine, Ser: 5.76 mg/dL — ABNORMAL HIGH (ref 0.61–1.24)
GFR, Estimated: 9 mL/min — ABNORMAL LOW (ref >60)
Glucose, Bld: 105 mg/dL — ABNORMAL HIGH (ref 70–99)
Phosphorus: 3.9 mg/dL (ref 2.5–4.6)
Potassium: 3.8 mmol/L (ref 3.5–5.1)
Sodium: 137 mmol/L (ref 135–145)

## 2022-09-07 MED ORDER — EPOETIN ALFA 10000 UNIT/ML IJ SOLN
INTRAMUSCULAR | Status: AC
  Filled 2022-09-07: qty 1

## 2022-09-07 NOTE — [facility] Group Notes (Signed)
[facility] Group Notes:  (Nursing/MHT/Case Management/Adjunct)  Date:  09/07/2022  Time:  10:35 AM  Type of Therapy:   outside rec  Participation Level:  Did Not Attend    [name] 09/07/2022, 10:35 AM

## 2022-09-07 NOTE — Progress Notes (Signed)
   09/07/22 1245  Psych Admission Type (Psych Patients Only)  Admission Status Voluntary  Psychosocial Assessment  Patient Complaints Depression  Eye Contact Fair  Facial Expression Sullen;Flat  Affect Depressed  Speech Logical/coherent  Interaction Assertive  Motor Activity Slow  Appearance/Hygiene In scrubs  Behavior Characteristics Cooperative  Mood Depressed  Thought Process  Coherency WDL  Content WDL  Delusions None reported or observed  Perception WDL  Hallucination None reported or observed  Judgment WDL  Confusion None  Danger to Self  Current suicidal ideation? Denies  Danger to Others  Danger to Others None reported or observed

## 2022-09-07 NOTE — Progress Notes (Signed)
  Received patient in bed to unit.   Informed consent signed and in chart.    TX duration:2.43hrs     Transported back to floor  Hand-off given to patient's nurse. No c/o and no distress noted    Access used: LAVG Access issues: high venous pressures and clotting of lines    Total UF removed: 0.8L Medication(s) given: 10,000u epo  Post HD VS: 156/77 Post HD weight: 81.0kg     Lynann Beaver  K[facility] Dialysis Unit
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
## 2022-09-07 NOTE — Progress Notes (Signed)
[facility]  Dialysis Note   Subjective:   Patient was seen and evaluated during dialysis   HEMODIALYSIS FLOWSHEET:  Blood Flow Rate (mL/min): 300 mL/min (BFR decreased to 300 due to high AP) Arterial Pressure (mmHg): -180 mmHg Venous Pressure (mmHg): 240 mmHg TMP (mmHg): 21 mmHg Ultrafiltration Rate (mL/min): 631 mL/min Dialysate Flow Rate (mL/min): 300 ml/min Dialysis Fluid Bolus: Normal Saline Bolus Amount (mL): 100 mL  Seated in chair No complaints to offer   Objective:  Vital signs in last 24 hours:  Temp:  [98 F (36.7 C)-98.5 F (36.9 C)] 98.1 F (36.7 C) (06/18 0749) Pulse Rate:  [64-94] 70 (06/18 1000) Resp:  [13-29] 15 (06/18 1000) BP: (104-143)/(61-88) 104/65 (06/18 1000) SpO2:  [98 %-100 %] 98 % (06/18 1000) Weight:  [81.5 kg-82.1 kg] 81.5 kg (06/18 0751)  Weight change:  Filed Weights   09/04/22 1239 09/06/22 1457 09/07/22 0751  Weight: 80.9 kg 82.1 kg 81.5 kg    Intake/Output: No intake/output data recorded.   Intake/Output this shift:  No intake/output data recorded.  Physical Exam: General: NAD,   Head: Normocephalic, atraumatic. Moist oral mucosal membranes  Eyes: Anicteric  Lungs:  Clear to auscultation  Heart: Regular rate and rhythm  Abdomen:  Soft, nontender,   Extremities:  No peripheral edema.  Neurologic: Nonfocal, moving all four extremities  Skin: No lesions  Access: Lt AVF    Basic Metabolic Panel: Recent Labs  Lab 09/02/22 0754 09/04/22 0858 09/07/22 0807  NA 138 137 137  K 3.7 3.5 3.8  CL 103 99 102  CO2 25 25 24   GLUCOSE 108* 185* 105*  BUN 68* 57* 66*  CREATININE 5.70* 5.15* 5.76*  CALCIUM 9.3 9.1 8.9  PHOS 2.9 2.3* 3.9     Liver Function Tests: Recent Labs  Lab 09/02/22 0754 09/04/22 0858 09/07/22 0807  ALBUMIN 3.6 3.8 3.9    No results for input(s): "LIPASE", "AMYLASE" in the last 168 hours. No results for input(s): "AMMONIA" in the last 168 hours.  CBC: Recent Labs  Lab  09/02/22 0754 09/04/22 0858 09/07/22 0807  WBC 3.6* 3.4* 3.3*  HGB 8.5* 8.7* 8.9*  HCT 24.5* 25.4* 25.3*  MCV 99.6 102.0* 102.4*  PLT 130* 146* 140*     Cardiac Enzymes: No results for input(s): "CKTOTAL", "CKMB", "CKMBINDEX", "TROPONINI" in the last 168 hours.  BNP: Invalid input(s): "POCBNP"  CBG: No results for input(s): "GLUCAP" in the last 168 hours.  Microbiology: Results for orders placed or performed in visit on 05/12/22  Microscopic Examination     Status: Abnormal   Collection Time: 05/12/22 10:39 AM   Urine  Result Value Ref Range Status   WBC, UA 6-10 (A) 0 - 5 /hpf Final   RBC, Urine 0-2 0 - 2 /hpf Final   Epithelial Cells (non renal) 0-10 0 - 10 /hpf Final   Bacteria, UA Moderate (A) None seen/Few Final    Coagulation Studies: No results for input(s): "LABPROT", "INR" in the last 72 hours.  Urinalysis: No results for input(s): "COLORURINE", "LABSPEC", "PHURINE", "GLUCOSEU", "HGBUR", "BILIRUBINUR", "KETONESUR", "PROTEINUR", "UROBILINOGEN", "NITRITE", "LEUKOCYTESUR" in the last 72 hours.  Invalid input(s): "APPERANCEUR"    Imaging: No results found.   Medications:     allopurinol  100 mg Oral BID   ARIPiprazole  15 mg Oral QPC breakfast   benazepril  20 mg Oral QPM   buPROPion  150 mg Oral Daily   cholecalciferol  1,000 Units Oral Daily   doxepin  100 mg Oral  QHS   epoetin (EPOGEN/PROCRIT) injection  10,000 Units Intravenous Q T,Th,Sa-HD   feeding supplement (NEPRO CARB STEADY)  237 mL Oral Q24H   lamoTRIgine  25 mg Oral [id number]   loratadine  10 mg Oral Daily   potassium chloride  10 mEq Oral Daily   rosuvastatin  10 mg Oral Daily   senna-docusate  2 tablet Oral QPC breakfast   traZODone  100 mg Oral QHS   acetaminophen, alteplase, alum & mag hydroxide-simeth, droperidol, fentaNYL (SUBLIMAZE) injection, fluticasone, heparin, lidocaine (PF), lidocaine-prilocaine, loperamide, ondansetron (ZOFRAN) IV, pentafluoroprop-tetrafluoroeth,  polyethylene glycol, promethazine  Assessment/ Plan:  Mr. Melvine Phaneuf. is a 77 y.o.  male white male with end stage renal disease on hemodialysis, hypertension, gout, depression, history of melanoma, crohn's, and CVA who presents to [facility] on 07/30/2022 for Bipolar depression (HCC) [F31.9]   Principal Problem:   Bipolar depression (HCC) End-stage renal disease.    End Stage Renal Disease on hemodialysis: Receiving dialysis today, UF goal 1L as tolerated.  Next treatment scheduled for Thursday  Potassium [facility] vascular lab)  Date Value Ref Range Status  09/28/2021 3.6 3.5 - 5.1 mmol/L Final    Comment:    Performed at [facility], [address] [place], [place] [address]   No intake or output data in the 24 hours ending 09/07/22 1016   2. Hypertension with chronic kidney disease: Blood pressure 110/65 during dialysis   BP 104/65   Pulse 70   Temp 98.1 F (36.7 C) (Oral)   Resp 15   Ht 5\' 6"  (1.676 m)   Wt 81.5 kg   SpO2 98%   BMI 29.00 kg/m   3. Anemia of chronic kidney disease/ kidney injury/chronic disease/acute blood loss:   Lab Results  Component Value Date   HGB 8.9 (L) 09/07/2022  Hgb just below target. Continue EPO with dialysis Continue anemia protocol.  4. Secondary Hyperparathyroidism:     Lab Results  Component Value Date   CALCIUM 8.9 09/07/2022   CAION 1.17 11/05/2021   PHOS 3.9 09/07/2022   Will continue to monitor bone minerals during this admission      LOS: [address] [facility] 6/18/202410:16 AM

## 2022-09-07 NOTE — BH IP Treatment Plan (Signed)
Interdisciplinary Treatment and Diagnostic Plan Update  09/07/2022 Time of Session: 4:00PM Luis Miller. MRN: [id number]  Principal Diagnosis: Bipolar depression (HCC)  Secondary Diagnoses: Principal Problem:   Bipolar depression (HCC)   Current Medications:  Current Facility-Administered Medications  Medication Dose Route Frequency Provider Last Rate Last Admin   acetaminophen (TYLENOL) tablet 650 mg  650 mg Oral Q6H PRN Clapacs, [name], MD   650 mg at 09/02/22 1415   allopurinol (ZYLOPRIM) tablet 100 mg  100 mg Oral BID Clapacs, [name], MD   100 mg at 09/07/22 1242   alteplase (CATHFLO ACTIVASE) injection 2 mg  2 mg Intracatheter Once PRN Lateef, Munsoor, MD       alum & mag hydroxide-simeth (MAALOX/MYLANTA) 200-200-20 MG/5ML suspension 30 mL  30 mL Oral Q4H PRN Clapacs, [name], MD       ARIPiprazole (ABILIFY) tablet 15 mg  15 mg Oral QPC breakfast [name], DO   15 mg at 09/07/22 1242   benazepril (LOTENSIN) tablet 20 mg  20 mg Oral QPM Clapacs, [name], MD   20 mg at 09/06/22 1655   buPROPion (WELLBUTRIN XL) 24 hr tablet 150 mg  150 mg Oral Daily Clapacs, [name], MD   150 mg at 09/07/22 1242   cholecalciferol (VITAMIN D3) 25 MCG (1000 UNIT) tablet 1,000 Units  1,000 Units Oral Daily Clapacs, Jackquline Denmark, MD   1,000 Units at 09/07/22 1242   doxepin (SINEQUAN) capsule 100 mg  100 mg Oral QHS Clapacs, [name], MD   100 mg at 09/06/22 2145   droperidol (INAPSINE) 2.5 MG/ML injection 0.625 mg  0.625 mg Intravenous Once PRN Yevette Edwards, MD       epoetin alfa (EPOGEN) injection 10,000 Units  10,000 Units Intravenous Q T,Th,Sa-HD Wendee Beavers, NP   10,000 Units at 09/07/22 1049   feeding supplement (NEPRO CARB STEADY) liquid 237 mL  237 mL Oral Q24H Thedore Mins, Harmeet, MD   237 mL at 09/07/22 1253   fentaNYL (SUBLIMAZE) injection 25 mcg  25 mcg Intravenous Q5 min PRN Darleene Cleaver, Gijsbertus F, MD       fluticasone (FLONASE) 50 MCG/ACT nasal spray 2 spray  2 spray Each Nare  Daily PRN Clapacs, [name], MD       heparin injection 1,000 Units  1,000 Units Dialysis PRN Cherylann Ratel, Munsoor, MD       lamoTRIgine (LAMICTAL) tablet 25 mg  25 mg Oral [id number] Herrick, Richard Edward, DO   25 mg at 09/06/22 1522   lidocaine (PF) (XYLOCAINE) 1 % injection 5 mL  5 mL Intradermal PRN Lateef, Munsoor, MD       lidocaine-prilocaine (EMLA) cream 1 Application  1 Application Topical PRN Lateef, Munsoor, MD   1 Application at 08/12/22 0700   loperamide (IMODIUM) capsule 2 mg  2 mg Oral PRN Clapacs, Jackquline Denmark, MD   2 mg at 08/28/22 1711   loratadine (CLARITIN) tablet 10 mg  10 mg Oral Daily Clapacs, Jackquline Denmark, MD   10 mg at 09/07/22 1242   ondansetron (ZOFRAN) injection 4 mg  4 mg Intravenous Once PRN Darleene Cleaver, Gerrit Heck, MD       pentafluoroprop-tetrafluoroeth (GEBAUERS) aerosol 1 Application  1 Application Topical PRN Lateef, Munsoor, MD       polyethylene glycol (MIRALAX / GLYCOLAX) packet 17 g  17 g Oral BID PRN Reggie Pile, MD   17 g at 08/23/22 1702   potassium chloride (KLOR-CON M) CR tablet 10 mEq  10 mEq Oral Daily [name], DO   10 mEq at 09/07/22 1242   promethazine (PHENERGAN) injection 6.25-12.5 mg  6.25-12.5 mg Intravenous Q15 min PRN Yevette Edwards, MD       rosuvastatin (CRESTOR) tablet 10 mg  10 mg Oral Daily Clapacs, Jackquline Denmark, MD   10 mg at 09/07/22 1242   senna-docusate (Senokot-S) tablet 2 tablet  2 tablet Oral QPC breakfast [name], DO   2 tablet at 09/07/22 1242   traZODone (DESYREL) tablet 100 mg  100 mg Oral QHS Clapacs, [name], MD   100 mg at 09/06/22 2145   PTA Medications: Medications Prior to Admission  Medication Sig Dispense Refill Last Dose   amLODipine (NORVASC) 10 MG tablet TAKE 1 TABLET DAILY 90 tablet 1 08/19/2022   benazepril (LOTENSIN) 40 MG tablet Take 40 mg by mouth daily.    08/19/2022   calcitRIOL (ROCALTROL) 0.25 MCG capsule Take 0.25 mcg by mouth daily.   08/19/2022   FLUoxetine (PROZAC) 20 MG capsule Take 1 capsule  (20 mg total) by mouth daily. 90 capsule 0 08/19/2022   fluticasone (FLONASE) 50 MCG/ACT nasal spray Place 2 sprays into both nostrils daily. 16 g 6 08/19/2022   hydrALAZINE (APRESOLINE) 100 MG tablet Take 100 mg by mouth 3 (three) times daily.   08/19/2022   lamoTRIgine (LAMICTAL) 25 MG tablet TAKE 1 TABLET DAILY 90 tablet 0 08/19/2022   loperamide (IMODIUM) 2 MG capsule Take 2 mg by mouth daily as needed for diarrhea or loose stools.   08/19/2022   loratadine (CLARITIN) 10 MG tablet TAKE 1 TABLET BY MOUTH EVERY DAY 90 tablet 1 08/19/2022   Multiple Vitamin (MULTIVITAMIN) tablet Take 1 tablet by mouth daily.   08/19/2022   OLANZapine (ZYPREXA) 5 MG tablet Take 1 tablet (5 mg total) by mouth at bedtime. 90 tablet 0 08/19/2022   rosuvastatin (CRESTOR) 10 MG tablet TAKE 1 TABLET BY MOUTH EVERY DAY 90 tablet 0 08/19/2022   Vitamin D, Cholecalciferol, 25 MCG (1000 UT) TABS Take 1,000 tablets by mouth daily.    08/19/2022   allopurinol (ZYLOPRIM) 100 MG tablet Take 100 mg by mouth 2 (two) times daily.       Patient Stressors: Medication change or noncompliance    Patient Strengths: Ability for insight  Average or above average intelligence  Capable of independent living  Communication skills  Motivation for treatment/growth  Supportive family/friends   Treatment Modalities: Medication Management, Group therapy, Case management,  1 to 1 session with clinician, Psychoeducation, Recreational therapy.   Physician Treatment Plan for Primary Diagnosis: Bipolar depression (HCC) Long Term Goal(s): Improvement in symptoms so as ready for discharge   Short Term Goals: Compliance with prescribed medications will improve Ability to verbalize feelings will improve  Medication Management: Evaluate patient's response, side effects, and tolerance of medication regimen.  Therapeutic Interventions: 1 to 1 sessions, Unit Group sessions and Medication administration.  Evaluation of Outcomes:  Progressing  Physician Treatment Plan for Secondary Diagnosis: Principal Problem:   Bipolar depression (HCC)  Long Term Goal(s): Improvement in symptoms so as ready for discharge   Short Term Goals: Compliance with prescribed medications will improve Ability to verbalize feelings will improve     Medication Management: Evaluate patient's response, side effects, and tolerance of medication regimen.  Therapeutic Interventions: 1 to 1 sessions, Unit Group sessions and Medication administration.  Evaluation of Outcomes: Progressing   RN Treatment Plan for Primary Diagnosis: Bipolar depression (HCC) Long Term Goal(s): Knowledge of  disease and therapeutic regimen to maintain health will improve  Short Term Goals: Ability to remain free from injury will improve, Ability to verbalize frustration and anger appropriately will improve, Ability to demonstrate self-control, Ability to participate in decision making will improve, Ability to verbalize feelings will improve, Ability to disclose and discuss suicidal ideas, Ability to identify and develop effective coping behaviors will improve, and Compliance with prescribed medications will improve  Medication Management: RN will administer medications as ordered by provider, will assess and evaluate patient's response and provide education to patient for prescribed medication. RN will report any adverse and/or side effects to prescribing provider.  Therapeutic Interventions: 1 on 1 counseling sessions, Psychoeducation, Medication administration, Evaluate responses to treatment, Monitor vital signs and CBGs as ordered, Perform/monitor CIWA, COWS, AIMS and Fall Risk screenings as ordered, Perform wound care treatments as ordered.  Evaluation of Outcomes: Progressing   LCSW Treatment Plan for Primary Diagnosis: Bipolar depression (HCC) Long Term Goal(s): Safe transition to appropriate next level of care at discharge, Engage patient in therapeutic group  addressing interpersonal concerns.  Short Term Goals: Engage patient in aftercare planning with referrals and resources, Increase social support, Increase ability to appropriately verbalize feelings, Increase emotional regulation, Facilitate acceptance of mental health diagnosis and concerns, and Increase skills for wellness and recovery  Therapeutic Interventions: Assess for all discharge needs, 1 to 1 time with Social worker, Explore available resources and support systems, Assess for adequacy in community support network, Educate family and significant other(s) on suicide prevention, Complete Psychosocial Assessment, Interpersonal group therapy.  Evaluation of Outcomes: Progressing   Progress in Treatment: Attending groups: Yes. Participating in groups: Yes. Taking medication as prescribed: Yes. Toleration medication: Yes. Family/Significant other contact made: Yes, individual(s) contacted:  SPE completed with the patient's ex wife Patient understands diagnosis: Yes. Discussing patient identified problems/goals with staff: Yes. Medical problems stabilized or resolved: Yes. Denies suicidal/homicidal ideation: Yes. Issues/concerns per patient self-inventory: No. Other: none  New problem(s) identified: No, Describe:  none   New Short Term/Long Term Goal(s): Patient to work towards medication management for mood stabilization; elimination of SI thoughts; development of comprehensive mental wellness plan. Update 6/8: No update at this time.  Update 09/02/2022: No changes at this time. Update 09/07/2022:  No changes at this time.    Patient Goals:   No additional goals identified at this time. Patient to continue to work towards original goals identified in initial treatment team meeting. CSW will remain available to patient should they voice additional treatment goals. Update 6/8: The patient has not identified any new goals at this time. Update 09/02/2022: No changes at this time. Update  09/07/2022:  No changes at this time.    Discharge Plan or Barriers: No psychosocial barriers identified at this time, patient to return to place of residence when appropriate for discharge. Update 6/8: There are no new barriers identified at this time. The patient will return home, the doctor has not provided a discharge date as of yet. Update 09/02/2022: CSW notes progress from patient. However,  patient does not acknowledge his progress.  Patient reports that he needs help at home, however, is not able to identify what support is needed.  The things identified patient was able to acknowledge that he addresses those things on his own at home and at the hospital.  Update 09/07/2022:  Patient remains somewhat apprehensive towards discharge. Patient reports that he will return home when it is time for him to be discharged.     Reason  for Continuation of Hospitalization: Depression Medication stabilization   Estimated Length of Stay: 1-7 days Update 6/8: TBD Update 09/02/2022: TBD Update 09/07/2022:  TBD  Last 3 [place] Suicide Severity Risk Score: Flowsheet Row Admission (Current) from 07/30/2022 in [facility] Most recent reading at 08/23/2022 12:20 PM ED from 07/30/2022 in [facility] Emergency Department at [facility] Most recent reading at 07/30/2022  3:10 PM Office Visit from 07/26/2022 in confidential department Most recent reading at 07/27/2022 11:27 AM  C-SSRS RISK CATEGORY No Risk No Risk No Risk       Last PHQ 2/9 Scores:    07/26/2022    9:10 AM 02/24/2022    1:14 PM 01/25/2022    1:34 PM  Depression screen PHQ 2/9  Decreased Interest 1 0 0  Down, Depressed, Hopeless 1 0 0  PHQ - 2 Score 2 0 0  Altered sleeping 0 0 0  Tired, decreased energy 0 0 0  Change in appetite 0 0 0  Feeling bad or failure about yourself  1 0 0  Trouble concentrating 0 0 0  Moving slowly or fidgety/restless 0 0 0  Suicidal thoughts 0 0 0  PHQ-9 Score 3 0 0  Difficult doing  work/chores  Not difficult at all     Scribe for Treatment Team: Harden Mo, LCSW 09/07/2022 4:13 PM

## 2022-09-07 NOTE — Plan of Care (Signed)
  Problem: Education: Goal: Knowledge of General Education information will improve Description: Including pain rating scale, medication(s)/side effects and non-pharmacologic comfort measures Outcome: Progressing   Problem: Health Behavior/Discharge Planning: Goal: Ability to manage health-related needs will improve Outcome: Progressing   Problem: Clinical Measurements: Goal: Ability to maintain clinical measurements within normal limits will improve Outcome: Progressing Goal: Will remain free from infection Outcome: Progressing Goal: Diagnostic test results will improve Outcome: Progressing Goal: Respiratory complications will improve Outcome: Progressing Goal: Cardiovascular complication will be avoided Outcome: Progressing   Problem: Activity: Goal: Risk for activity intolerance will decrease Outcome: Progressing   Problem: Nutrition: Goal: Adequate nutrition will be maintained Outcome: Progressing   Problem: Skin Integrity: Goal: Risk for impaired skin integrity will decrease Outcome: Progressing   

## 2022-09-07 NOTE — Group Note (Signed)
Recreation Therapy Group Note   Group Topic:Communication  Group Date: 09/07/2022 Start Time: 1400 End Time: 1445 Facilitators: Rosina Lowenstein, LRT, CTRS Location:  Dayroom  Group Description: Name That Food. Pt will randomly select 1 cut paper from laminated stack of popular food images from the 1970's-1990's from LRT. Pt will use descriptive words to explain what food they selected. Once the food is correctly guessed, LRT and pts will reminisce and discuss past fond memories associated with the food. LRT and pts will go through as many rounds as the cards allow before running out.   Goal Area(s) Addressed: Patient will increase communication skills.  Patient will increase frustration tolerance skills. Patient will reminisce a fond memory in their life.    Affect/Mood: Appropriate and Flat   Participation Level: Active and Engaged   Participation Quality: Independent   Behavior: Appropriate, Calm, Cooperative, and Sedated   Speech/Thought Process: Coherent   Insight: Good   Judgement: Good   Modes of Intervention: Activity   Patient Response to Interventions:  Interested  and Receptive   Education Outcome:  Acknowledges education   Clinical Observations/Individualized Feedback: [name] was active in their participation of session activities and group discussion. 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    Plan: Continue to engage patient in RT group sessions 2-3x/week.   Rosina Lowenstein, LRT, CTRS 09/07/2022 3:29 PM

## 2022-09-08 DIAGNOSIS — F319 Bipolar disorder, unspecified: Secondary | ICD-10-CM | POA: Diagnosis not present

## 2022-09-08 NOTE — Group Note (Signed)
Recreation Therapy Group Note   Group Topic:Relaxation  Group Date: 09/08/2022 Start Time: 1400 End Time: 1445 Facilitators: Rosina Lowenstein, LRT, CTRS Location:  Dayroom  Group Description: PMR (Progressive Muscle Relaxation). LRT asks patients their current level of stress/anxiety from 1-10, with 10 being the highest. LRT educates patients on what PMR is and the benefits that come from it. Patients are asked to sit with their feet flat on the floor while sitting up and all the way back in their chair, if possible. LRT and pts follow a prompt through a speaker that requires you to tense and release different muscles in their body and focus on their breathing. During session, lights are off and soft music is being played. At the end of the prompt, LRT asks patients to rank their current levels of stress/anxiety from 1-10, 10 being the highest.   Goal Area(s) Addressed:  Patients will be able to describe progressive muscle relaxation.  Patient will practice using relaxation technique. Patient will identify a new coping skill.  Patient will follow multistep directions to reduce anxiety and stress.  Affect/Mood: Appropriate   Participation Level: Active and Engaged   Participation Quality: Independent   Behavior: Calm and Cooperative   Speech/Thought Process: Coherent   Insight: Good   Judgement: Good   Modes of Intervention: Activity   Patient Response to Interventions:  Attentive, Engaged, Interested , and Receptive   Education Outcome:  Acknowledges education   Clinical Observations/Individualized Feedback: [name] was active in their participation of session activities and group discussion. Pt identified that his stress and anxiety were a 10 before the session. Afterwards, he rated his stress and anxiety a 10. Pt interacted well with LRT and peers duration of session.   Plan: Continue to engage patient in RT group sessions 2-3x/week.   Rosina Lowenstein, LRT, CTRS 09/08/2022  3:18 PM

## 2022-09-08 NOTE — Progress Notes (Signed)
Patient is alert and oriented. Affect flat. Mood depressed. He denies SI/HI/AVH. He denies depression but endorses anxiety about being discharged. He participates in group activities and is appropriate with staff and peers.  Patient remains on strict I's and O's. Dialysis scheduled for tomorrow 06.20.   Patient approached this writer to say that he no longer wants to be discharged on Friday 06.21 despite telling the MD that he would be ready to do so. MD informed via secure chat.  Q15 minute unit checks in place.

## 2022-09-08 NOTE — Plan of Care (Signed)
  Problem: Pain Managment: Goal: General experience of comfort will improve Outcome: Progressing   Problem: Safety: Goal: Ability to remain free from injury will improve Outcome: Progressing   Problem: Skin Integrity: Goal: Risk for impaired skin integrity will decrease Outcome: Progressing   

## 2022-09-08 NOTE — Group Note (Signed)
Date:  09/08/2022 Time:  8:40 PM  Group Topic/Focus:  Crisis Planning:   The purpose of this group is to help patients create a crisis plan for use upon discharge or in the future, as needed.    Participation Level:  Active  Participation Quality:  Appropriate  Affect:  Appropriate  Cognitive:  Alert  Insight: Appropriate  Engagement in Group:  Engaged  Modes of Intervention:  Education  Additional Comments:    Jeannette How 09/08/2022, 8:40 PM

## 2022-09-08 NOTE — Progress Notes (Signed)
   09/08/22 2000  Psych Admission Type (Psych Patients Only)  Admission Status Voluntary  Psychosocial Assessment  Patient Complaints Depression  Eye Contact Fair  Facial Expression Sullen  Affect Flat  Speech Logical/coherent  Interaction Assertive  Motor Activity Slow  Appearance/Hygiene In scrubs  Behavior Characteristics Cooperative  Mood Depressed  Thought Process  Coherency WDL  Content WDL  Delusions None reported or observed  Perception WDL  Hallucination None reported or observed  Judgment WDL  Confusion None  Danger to Self  Current suicidal ideation? Denies  Agreement Not to Harm Self Yes  Description of Agreement verbal  Danger to Others  Danger to Others None reported or observed

## 2022-09-08 NOTE — Progress Notes (Signed)
   09/08/22 0100  Psych Admission Type (Psych Patients Only)  Admission Status Voluntary  Psychosocial Assessment  Patient Complaints Depression  Eye Contact Fair  Facial Expression Sullen;Flat  Affect Depressed  Speech Logical/coherent  Interaction Assertive  Motor Activity Slow  Appearance/Hygiene In scrubs  Behavior Characteristics Cooperative  Mood Depressed  Thought Process  Coherency WDL  Content WDL  Delusions None reported or observed  Perception WDL  Hallucination None reported or observed  Judgment WDL  Confusion None  Danger to Self  Current suicidal ideation? Denies  Danger to Others  Danger to Others None reported or observed

## 2022-09-08 NOTE — [facility] Group Notes (Signed)
[facility] Group Notes:  (Nursing/MHT/Case Management/Adjunct)  Date:  09/08/2022  Time:  11:16 AM  Type of Therapy:  Outside Rec/Discussion  Participation Level:  Active  Participation Quality:  Appropriate  Affect:  Appropriate  Cognitive:  Appropriate  Insight:  Appropriate  Engagement in Group:  Engaged  Modes of Intervention:  Activity and Discussion  Summary of Progress/Problems:  Marta Antu 09/08/2022, 11:16 AM
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
## 2022-09-09 ENCOUNTER — Inpatient Hospital Stay: Payer: Medicare Other

## 2022-09-09 DIAGNOSIS — F319 Bipolar disorder, unspecified: Secondary | ICD-10-CM | POA: Diagnosis not present

## 2022-09-09 LAB — CBC
HCT: 27.2 % — ABNORMAL LOW (ref 39.0–52.0)
Hemoglobin: 9.6 g/dL — ABNORMAL LOW (ref 13.0–17.0)
MCH: 36 pg — ABNORMAL HIGH (ref 26.0–34.0)
MCHC: 35.3 g/dL (ref 30.0–36.0)
MCV: 101.9 fL — ABNORMAL HIGH (ref 80.0–100.0)
Platelets: 126 10*3/uL — ABNORMAL LOW (ref 150–400)
RBC: 2.67 MIL/uL — ABNORMAL LOW (ref 4.22–5.81)
RDW: 14.4 % (ref 11.5–15.5)
WBC: 3.7 10*3/uL — ABNORMAL LOW (ref 4.0–10.5)
nRBC: 0 % (ref 0.0–0.2)

## 2022-09-09 LAB — RENAL FUNCTION PANEL
Albumin: 3.8 g/dL (ref 3.5–5.0)
Anion gap: 12 (ref 5–15)
BUN: 64 mg/dL — ABNORMAL HIGH (ref 8–23)
CO2: 24 mmol/L (ref 22–32)
Calcium: 9 mg/dL (ref 8.9–10.3)
Chloride: 100 mmol/L (ref 98–111)
Creatinine, Ser: 5.67 mg/dL — ABNORMAL HIGH (ref 0.61–1.24)
GFR, Estimated: 10 mL/min — ABNORMAL LOW (ref >60)
Glucose, Bld: 205 mg/dL — ABNORMAL HIGH (ref 70–99)
Phosphorus: 3.4 mg/dL (ref 2.5–4.6)
Potassium: 3.5 mmol/L (ref 3.5–5.1)
Sodium: 136 mmol/L (ref 135–145)

## 2022-09-09 MED ORDER — EPOETIN ALFA 10000 UNIT/ML IJ SOLN
INTRAMUSCULAR | Status: AC
  Filled 2022-09-09: qty 1

## 2022-09-09 NOTE — Group Note (Signed)
Recreation Therapy Group Note   Group Topic:Stress Management  Group Date: 09/09/2022 Start Time: 1400 End Time: 1500 Facilitators: Rosina Lowenstein, LRT, CTRS Location:  Dayroom  Group description: Minute To Win It. LRT and pts played 3 minute to win it games. The first one being that they had to list out loud as many presidents as they could, "name that tune" where they listened to a small clip of a song and had to guess the name of it, and lastly cup stack. For cup stack, each pt is given 6 foam cups. They are instructed to make 1 pyramid while using all 6 cups using both hands. Second round, they only use their right hand to make a pyramid and the third round, they only use their left hand. LRT and pts discussed the physical and mental symptoms of being under stress or under pressure. LRT and pts discussed how this can be used post discharge.   Goal Area(s) Addressed: Patient will identify physical symptoms of stress. Patient will identify emotional symptoms of stress. Patient will build frustration tolerance skills.   Affect/Mood: Appropriate and Flat   Participation Level: Active and Engaged   Participation Quality: Independent   Behavior: Appropriate   Speech/Thought Process: Coherent   Insight: Good   Judgement: Good   Modes of Intervention: Activity   Patient Response to Interventions:  Attentive, Engaged, Interested , and Receptive   Education Outcome:  Acknowledges education   Clinical Observations/Individualized Feedback: Luis Miller was active in their participation of session activities and group discussion. Pt identified "my head hurts whenever I get stressed" as a physical symptom of stress. Pt appropriate played games while interacting well with LRT and peers.    Plan: Continue to engage patient in RT group sessions 2-3x/week.   [address] LRT, CTRS 09/09/2022 4:10 PM

## 2022-09-09 NOTE — Progress Notes (Addendum)
[facility]  Dialysis Note   Subjective:   Patient seen and evaluated during dialysis   HEMODIALYSIS FLOWSHEET:  Blood Flow Rate (mL/min): 400 mL/min Arterial Pressure (mmHg): -200 mmHg Venous Pressure (mmHg): 270 mmHg TMP (mmHg): 13 mmHg Ultrafiltration Rate (mL/min): 543 mL/min Dialysate Flow Rate (mL/min): 300 ml/min Dialysis Fluid Bolus: Normal Saline Bolus Amount (mL): 100 mL  Voices concerns of possible discharge tomorrow, lack of social support, insomnia  Objective:  Vital signs in last 24 hours:  Temp:  [98.4 F (36.9 C)-99.3 F (37.4 C)] 98.6 F (37 C) (06/20 0827) Pulse Rate:  [68-86] 73 (06/20 1030) Resp:  [15-28] 28 (06/20 1030) BP: (90-143)/(66-94) 127/77 (06/20 1030) SpO2:  [96 %-100 %] 98 % (06/20 1030) Weight:  [81.5 kg] 81.5 kg (06/20 0827)  Weight change:  Filed Weights   09/07/22 0751 09/07/22 1223 09/09/22 0827  Weight: 81.5 kg 81 kg 81.5 kg    Intake/Output: I/O last 3 completed shifts: In: 1140 [P.O.:1140] Out: 750 [Urine:750]   Intake/Output this shift:  No intake/output data recorded.  Physical Exam: General: NAD, flat affect   Head: Normocephalic, atraumatic. Moist oral mucosal membranes  Eyes: Anicteric  Lungs:  Clear to auscultation  Heart: Regular rate and rhythm  Abdomen:  Soft, nontender  Extremities:  Trace peripheral edema.  Neurologic: Nonfocal, moving all four extremities  Skin: No lesions  Access: Lt AVF    Basic Metabolic Panel: Recent Labs  Lab 09/04/22 0858 09/07/22 0807 09/09/22 0842  NA 137 137 136  K 3.5 3.8 3.5  CL 99 102 100  CO2 25 24 24   GLUCOSE 185* 105* 205*  BUN 57* 66* 64*  CREATININE 5.15* 5.76* 5.67*  CALCIUM 9.1 8.9 9.0  PHOS 2.3* 3.9 3.4     Liver Function Tests: Recent Labs  Lab 09/04/22 0858 09/07/22 0807 09/09/22 0842  ALBUMIN 3.8 3.9 3.8    No results for input(s): "LIPASE", "AMYLASE" in the last 168 hours. No results for input(s): "AMMONIA" in the last 168  hours.  CBC: Recent Labs  Lab 09/04/22 0858 09/07/22 0807 09/09/22 0842  WBC 3.4* 3.3* 3.7*  HGB 8.7* 8.9* 9.6*  HCT 25.4* 25.3* 27.2*  MCV 102.0* 102.4* 101.9*  PLT 146* 140* 126*     Cardiac Enzymes: No results for input(s): "CKTOTAL", "CKMB", "CKMBINDEX", "TROPONINI" in the last 168 hours.  BNP: Invalid input(s): "POCBNP"  CBG: No results for input(s): "GLUCAP" in the last 168 hours.  Microbiology: Results for orders placed or performed in visit on 05/12/22  Microscopic Examination     Status: Abnormal   Collection Time: 05/12/22 10:39 AM   Urine  Result Value Ref Range Status   WBC, UA 6-10 (A) 0 - 5 /hpf Final   RBC, Urine 0-2 0 - 2 /hpf Final   Epithelial Cells (non renal) 0-10 0 - 10 /hpf Final   Bacteria, UA Moderate (A) None seen/Few Final    Coagulation Studies: No results for input(s): "LABPROT", "INR" in the last 72 hours.  Urinalysis: No results for input(s): "COLORURINE", "LABSPEC", "PHURINE", "GLUCOSEU", "HGBUR", "BILIRUBINUR", "KETONESUR", "PROTEINUR", "UROBILINOGEN", "NITRITE", "LEUKOCYTESUR" in the last 72 hours.  Invalid input(s): "APPERANCEUR"    Imaging: No results found.   Medications:     allopurinol  100 mg Oral BID   ARIPiprazole  15 mg Oral QPC breakfast   benazepril  20 mg Oral QPM   buPROPion  150 mg Oral Daily   cholecalciferol  1,000 Units Oral Daily   doxepin  100 mg  Oral QHS   epoetin (EPOGEN/PROCRIT) injection  10,000 Units Intravenous Q T,Th,Sa-HD   feeding supplement (NEPRO CARB STEADY)  237 mL Oral Q24H   lamoTRIgine  25 mg Oral [id number]   loratadine  10 mg Oral Daily   potassium chloride  10 mEq Oral Daily   rosuvastatin  10 mg Oral Daily   senna-docusate  2 tablet Oral QPC breakfast   traZODone  100 mg Oral QHS   acetaminophen, alteplase, alum & mag hydroxide-simeth, droperidol, fentaNYL (SUBLIMAZE) injection, fluticasone, heparin, lidocaine (PF), lidocaine-prilocaine, loperamide, ondansetron (ZOFRAN) IV,  pentafluoroprop-tetrafluoroeth, polyethylene glycol, promethazine  Assessment/ Plan:  [name]. is a 77 y.o.  male white male with end stage renal disease on hemodialysis, hypertension, gout, depression, history of melanoma, crohn's, and CVA who presents to [facility] on 07/30/2022 for Bipolar depression (HCC) [F31.9]   Principal Problem:   Bipolar depression (HCC) End-stage renal disease.   End Stage Renal Disease on hemodialysis: Receiving dialysis today, UF goal 1L as tolerated. Next treatment scheduled for Saturday.  Chest xray ordered for outpatient dialysis clinic placement  Due to extended admission, patient's outpatient dialysis clinic must re-instate patient prior to discharge. Renal navigator aware and will follow discharge plan closely.   Potassium [facility] vascular lab)  Date Value Ref Range Status  09/28/2021 3.6 3.5 - 5.1 mmol/L Final    Comment:    Performed at [facility], [address] [place], [place] [address]    Intake/Output Summary (Last 24 hours) at 09/09/2022 1042 Last data filed at 09/08/2022 1800 Gross per 24 hour  Intake 840 ml  Output 250 ml  Net 590 ml     2. Hypertension with chronic kidney disease: Blood pressure 127/77 during dialysis.   BP 127/77 (BP Location: Right Arm)   Pulse 73   Temp 98.6 F (37 C) (Oral)   Resp (!) 28   Ht 5\' 6"  (1.676 m)   Wt 81.5 kg   SpO2 98%   BMI 29.00 kg/m   3. Anemia of chronic kidney disease/ kidney injury/chronic disease/acute blood loss:   Lab Results  Component Value Date   HGB 9.6 (L) 09/09/2022  Hgb at goal. Continue EPO with dialysis   4. Secondary Hyperparathyroidism:     Lab Results  Component Value Date   CALCIUM 9.0 09/09/2022   CAION 1.17 11/05/2021   PHOS 3.4 09/09/2022   Calcium and phosphorus within desired range     LOS: 41 [facility] 6/20/202410:42 AM
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
## 2022-09-09 NOTE — Progress Notes (Signed)
   09/09/22 2300  Psych Admission Type (Psych Patients Only)  Admission Status Voluntary  Psychosocial Assessment  Patient Complaints Depression;Worrying;Nervousness  Eye Contact Fair  Facial Expression Sullen  Affect Flat  Speech Logical/coherent  Interaction Assertive  Motor Activity Slow  Appearance/Hygiene In scrubs  Behavior Characteristics Cooperative;Appropriate to situation  Mood Depressed;Sad  Thought Process  Coherency WDL  Content WDL  Delusions None reported or observed  Perception WDL  Hallucination None reported or observed  Judgment WDL  Confusion None  Danger to Self  Current suicidal ideation? Denies  Agreement Not to Harm Self Yes  Description of Agreement verbal  Danger to Others  Danger to Others None reported or observed

## 2022-09-09 NOTE — Progress Notes (Signed)
  Received patient in bed to unit.   Informed consent signed and in chart.    TX duration:3.5     Transported back to floor  Hand-off given to patient's nurse. No c/o and no distress noted    Access used: LAVG Access issues: none   Total UF removed: 1.0L Medication(s) given: 10,000u epogen Post HD VS: 125/72 Post HD weight: 80.3kg     Lynann Beaver  [facility] D[facility] Unit

## 2022-09-09 NOTE — Discharge Planning (Signed)
Confirmed with clinic that patient will resume same schedule and are expecting him on Saturday 6/22.  [facility]  [address] [place], [place] [address] [phone]  Scheduled days: Tuesday Thursday and Saturday Treatment time: 10:30am

## 2022-09-09 NOTE — Progress Notes (Signed)
   09/09/22 1455  Psych Admission Type (Psych Patients Only)  Admission Status Voluntary  Psychosocial Assessment  Patient Complaints Depression  Eye Contact Poor  Facial Expression Sullen  Affect Flat  Speech Soft  Interaction Assertive  Motor Activity Slow  Appearance/Hygiene In scrubs  Behavior Characteristics Cooperative  Mood Sad  Thought Process  Coherency WDL  Content WDL  Delusions None reported or observed  Perception WDL  Hallucination None reported or observed  Judgment WDL  Confusion None

## 2022-09-09 NOTE — Group Note (Unsigned)
Date:  09/09/2022 Time:  2:50 PM  Group Topic/Focus:  movement     Participation Level:  {B[facility] PARTICIPATION LEVEL:22264}  Participation Quality:  {BHH PARTICIPATION QUALITY:22265}  Affect:  {BHH AFFECT:22266}  Cognitive:  {B[facility] COGNITIVE:22267}  Insight: {BHH Insight2:20797}  Engagement in Group:  {B[facility] ENGAGEMENT IN GROUP:22268}  Modes of Intervention:  {BHH MODES OF INTERVENTION:22269}  Additional Comments:  ***  Charlee Whitebread P Ryelynn Guedea 09/09/2022, 2:50 PM  

## 2022-09-09 NOTE — [facility] Group Notes (Signed)
[facility] Group Notes:  (Nursing/MHT/Case Management/Adjunct)  Date:  09/09/2022  Time:  10:06 AM  Type of Therapy:  Movement Therapy  Participation Level:  Did Not Attend    Luis Miller 09/09/2022, 10:06 AM

## 2022-09-10 ENCOUNTER — Encounter (INDEPENDENT_AMBULATORY_CARE_PROVIDER_SITE_OTHER): Payer: Medicare Other

## 2022-09-10 ENCOUNTER — Ambulatory Visit (INDEPENDENT_AMBULATORY_CARE_PROVIDER_SITE_OTHER): Payer: Medicare Other | Admitting: Vascular Surgery

## 2022-09-10 DIAGNOSIS — F319 Bipolar disorder, unspecified: Secondary | ICD-10-CM | POA: Diagnosis not present

## 2022-09-10 NOTE — Group Note (Signed)
Date:  09/10/2022 Time:  12:13 AM  Group Topic/Focus:  Conflict Resolution:   The focus of this group is to discuss the conflict resolution process and how it may be used upon discharge.    Participation Level:  Active  Participation Quality:  Appropriate  Affect:  Appropriate  Cognitive:  Alert  Insight: Appropriate  Engagement in Group:  Engaged  Modes of Intervention:  Discussion  Additional Comments:    Garry Heater 09/10/2022, 12:13 AM

## 2022-09-10 NOTE — Group Note (Signed)
LCSW Group Therapy Note  Group Date: 09/10/2022 Start Time: 1315 End Time: 1400   Type of Therapy and Topic:  Group Therapy - How To Cope with Nervousness about Discharge   Participation Level:  None   Description of Group This process group involved identification of patients' feelings about discharge. Some of them are scheduled to be discharged soon, while others are new admissions, but each of them was asked to share thoughts and feelings surrounding discharge from the hospital. One common theme was that they are excited at the prospect of going home, while another was that many of them are apprehensive about sharing why they were hospitalized. Patients were given the opportunity to discuss these feelings with their peers in preparation for discharge.  Therapeutic Goals  Patient will identify their overall feelings about pending discharge. Patient will think about how they might proactively address issues that they believe will once again arise once they get home (i.e. with parents). Patients will participate in discussion about having hope for change.   Summary of Patient Progress:  Patient was present in group. Patient appeared attentive, however, did not engage in group discussion.  Therapeutic Modalities Cognitive Behavioral Therapy   Claudie Fisherman 09/10/2022  3:26 PM
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
## 2022-09-10 NOTE — BHH Counselor (Signed)
CSW met with the patient at patient's request.   Patient again stated that he did not feel that he was capable of returning to his home.  Patient stated that he wanted assistance with organizing his finances.   CSW stated that patient could reach out to trusted family/friends for assistance.  CSW advised that patient should have this conversation as CSW did not feel comfortable doing so.  CSW advised that patient not look for home health or a personal care sitter to assist with finances due to safety concerns.  Patient also stated that he had concerns for taking his medications.  CSW informed that psychiatrist and nursing would discuss with the patient his medications prior to discharge.  CSW also informed patient that the instructions will be written on patient's medication bottles.  Patient stated that he did not know how he would get his medication and CSW informed that psychiatrist will likely send prescription to patient's preferred pharmacy.  CSW pointed out that at last discussion the psychiatrist had not placed orders for CSW to make home health referrals.  CSW stated that she would review again.  CSW informed that she will again mention this to the psychiatrist.  CSW pointed out to patient the need to work on his anxiety with coping mechanisms.  CSW encouraged patient to speak with his son and ex-wife about supports and mentioned that per conversation with ex-wife several members of the community were willing to step up and assist the patient.  CSW asked if patient felt he needed a home and patient declined.   Patient identified that he does not know what will make him feel safe for home.   Patient stated that the major concern is organizing his finances and taking his medications.   CSW informed that she would reach out to occupational therapy for their input on medications.  [name], MSW, LCSW 09/10/2022 11:52 AM

## 2022-09-10 NOTE — Group Note (Signed)
Recreation Therapy Group Note   Group Topic:Leisure Education  Group Date: 09/10/2022 Start Time: 1400 End Time: 1500 Facilitators: Rosina Lowenstein, LRT, CTRS Location:  Day Room  Group Description: Leisure. Patients were given the option to choose from coloring, singing karaoke, or playing cards. LRT and pts discussed the meaning of leisure, the importance of participating in leisure during their free time/when they're outside of the hospital, as well as how our leisure interests can also serve as coping skills. Pt identified two leisure interests and shared with the group.   Goal Area(s) Addressed:  Patient will identify a current leisure interest.  Patient will learn the definition of "leisure". Patient will practice making a positive decision. Patient will have the opportunity to try a new leisure activity. Patient will communicate with peers and LRT.   Affect/Mood: Appropriate and Flat   Participation Level: Active and Engaged   Participation Quality: Independent   Behavior: Calm and Cooperative   Speech/Thought Process: Coherent   Insight: Good   Judgement: Good   Modes of Intervention: Activity   Patient Response to Interventions:  Attentive, Engaged, Interested , and Receptive   Education Outcome:  Acknowledges education   Clinical Observations/Individualized Feedback: [name] was active in their participation of session activities and group discussion. Pt identified "I use to dance and now I watch tv" as things he does in his free time. Pt chose to sing 2 Elvis Presley songs during group. Pt interacted well with LRT and peers duration of session.  Plan: Continue to engage patient in RT group sessions 2-3x/week.   Rosina Lowenstein, LRT, CTRS 09/10/2022 3:16 PM

## 2022-09-10 NOTE — Progress Notes (Signed)
   09/10/22 1000  Psych Admission Type (Psych Patients Only)  Admission Status Voluntary  Psychosocial Assessment  Patient Complaints Worrying  Eye Contact Fair  Facial Expression Sullen  Affect Flat  Speech Logical/coherent  Interaction Assertive  Motor Activity Slow  Appearance/Hygiene In scrubs  Behavior Characteristics Cooperative  Mood Sad  Thought Process  Coherency WDL  Content WDL  Delusions None reported or observed  Perception WDL  Hallucination None reported or observed  Judgment WDL  Confusion None  Danger to Self  Current suicidal ideation? Denies  Agreement Not to Harm Self Yes  Description of Agreement verbal

## 2022-09-10 NOTE — Plan of Care (Signed)

## 2022-09-11 LAB — RENAL FUNCTION PANEL
Albumin: 3.8 g/dL (ref 3.5–5.0)
Anion gap: 11 (ref 5–15)
BUN: 62 mg/dL — ABNORMAL HIGH (ref 8–23)
CO2: 25 mmol/L (ref 22–32)
Calcium: 9 mg/dL (ref 8.9–10.3)
Chloride: 102 mmol/L (ref 98–111)
Creatinine, Ser: 5.28 mg/dL — ABNORMAL HIGH (ref 0.61–1.24)
GFR, Estimated: 11 mL/min — ABNORMAL LOW (ref >60)
Glucose, Bld: 105 mg/dL — ABNORMAL HIGH (ref 70–99)
Phosphorus: 3.5 mg/dL (ref 2.5–4.6)
Potassium: 3.7 mmol/L (ref 3.5–5.1)
Sodium: 138 mmol/L (ref 135–145)

## 2022-09-11 LAB — CBC
HCT: 28.3 % — ABNORMAL LOW (ref 39.0–52.0)
Hemoglobin: 9.6 g/dL — ABNORMAL LOW (ref 13.0–17.0)
MCH: 35 pg — ABNORMAL HIGH (ref 26.0–34.0)
MCHC: 33.9 g/dL (ref 30.0–36.0)
MCV: 103.3 fL — ABNORMAL HIGH (ref 80.0–100.0)
Platelets: 128 10*3/uL — ABNORMAL LOW (ref 150–400)
RBC: 2.74 MIL/uL — ABNORMAL LOW (ref 4.22–5.81)
RDW: 14.9 % (ref 11.5–15.5)
WBC: 3.8 10*3/uL — ABNORMAL LOW (ref 4.0–10.5)
nRBC: 0 % (ref 0.0–0.2)

## 2022-09-11 MED ORDER — EPOETIN ALFA 10000 UNIT/ML IJ SOLN
INTRAMUSCULAR | Status: AC
  Filled 2022-09-11: qty 1

## 2022-09-11 NOTE — Group Note (Signed)
Date:  09/11/2022 Time:  3:42 PM  Group Topic/Focus:  Healthy Communication:   The focus of this group is to discuss communication, barriers to communication, as well as healthy ways to communicate with others.    Participation Level:  Did Not Attend    [name] 09/11/2022, 3:42 PM

## 2022-09-11 NOTE — Progress Notes (Signed)
Hemodialysis note  Received patient in bed to unit. Alert and oriented.  Informed consent signed and in chart.  Treatment initiated: 0749 Treatment completed: 1140  Patient tolerated well. Transported back to room, alert without acute distress.  Report given to patient's RN.   Access used: LUA AVG Access issues: High venous pressures  Total UF removed: 1L Medication(s) given:  Epogen 10 000 unit P Post HD weight: 80.2 kg   Wolfgang Phoenix [facility]

## 2022-09-11 NOTE — Progress Notes (Signed)
[facility]  Dialysis Note   Subjective:   Patient seen and evaluated during dialysis   HEMODIALYSIS FLOWSHEET:  Blood Flow Rate (mL/min): 350 mL/min Arterial Pressure (mmHg): -170 mmHg Venous Pressure (mmHg): 260 mmHg TMP (mmHg): 13 mmHg Ultrafiltration Rate (mL/min): 487 mL/min Dialysate Flow Rate (mL/min): 300 ml/min Dialysis Fluid Bolus: Normal Saline Bolus Amount (mL): 100 mL  Some elevated venous pressures.   Objective:  Vital signs in last 24 hours:  Temp:  [97.1 F (36.2 C)-98.1 F (36.7 C)] 97.8 F (36.6 C) (06/22 1140) Pulse Rate:  [58-89] 68 (06/22 1140) Resp:  [14-20] 17 (06/22 1140) BP: (106-150)/(57-82) 150/67 (06/22 1140) SpO2:  [99 %-100 %] 100 % (06/22 1140) Weight:  [81.2 kg] 81.2 kg (06/22 0739)  Weight change:  Filed Weights   09/09/22 0827 09/09/22 1239 09/11/22 0739  Weight: 81.5 kg 80.3 kg 81.2 kg    Intake/Output: I/O last 3 completed shifts: In: 1080 [P.O.:1080] Out: 900 [Urine:900]   Intake/Output this shift:  Total I/O In: -  Out: 1000 [Other:1000]  Physical Exam: General: NAD, flat affect   Head: Normocephalic, atraumatic. Moist oral mucosal membranes  Eyes: Anicteric  Lungs:  Clear to auscultation  Heart: Regular rate and rhythm  Abdomen:  Soft, nontender  Extremities:  Trace peripheral edema.  Neurologic: Nonfocal, moving all four extremities  Skin: No lesions  Access: Lt AVF    Basic Metabolic Panel: Recent Labs  Lab 09/07/22 0807 09/09/22 0842 09/11/22 0745  NA 137 136 138  K 3.8 3.5 3.7  CL 102 100 102  CO2 24 24 25   GLUCOSE 105* 205* 105*  BUN 66* 64* 62*  CREATININE 5.76* 5.67* 5.28*  CALCIUM 8.9 9.0 9.0  PHOS 3.9 3.4 3.5     Liver Function Tests: Recent Labs  Lab 09/07/22 0807 09/09/22 0842 09/11/22 0745  ALBUMIN 3.9 3.8 3.8    No results for input(s): "LIPASE", "AMYLASE" in the last 168 hours. No results for input(s): "AMMONIA" in the last 168 hours.  CBC: Recent Labs  Lab  09/07/22 0807 09/09/22 0842 09/11/22 0745  WBC 3.3* 3.7* 3.8*  HGB 8.9* 9.6* 9.6*  HCT 25.3* 27.2* 28.3*  MCV 102.4* 101.9* 103.3*  PLT 140* 126* 128*     Cardiac Enzymes: No results for input(s): "CKTOTAL", "CKMB", "CKMBINDEX", "TROPONINI" in the last 168 hours.  BNP: Invalid input(s): "POCBNP"  CBG: No results for input(s): "GLUCAP" in the last 168 hours.  Microbiology: Results for orders placed or performed in visit on 05/12/22  Microscopic Examination     Status: Abnormal   Collection Time: 05/12/22 10:39 AM   Urine  Result Value Ref Range Status   WBC, UA 6-10 (A) 0 - 5 /hpf Final   RBC, Urine 0-2 0 - 2 /hpf Final   Epithelial Cells (non renal) 0-10 0 - 10 /hpf Final   Bacteria, UA Moderate (A) None seen/Few Final    Coagulation Studies: No results for input(s): "LABPROT", "INR" in the last 72 hours.  Urinalysis: No results for input(s): "COLORURINE", "LABSPEC", "PHURINE", "GLUCOSEU", "HGBUR", "BILIRUBINUR", "KETONESUR", "PROTEINUR", "UROBILINOGEN", "NITRITE", "LEUKOCYTESUR" in the last 72 hours.  Invalid input(s): "APPERANCEUR"    Imaging: DG Chest 1 View  Result Date: 09/09/2022 CLINICAL DATA:  Chronic kidney disease. EXAM: CHEST  1 VIEW COMPARISON:  Chest x-ray dated June 30, 2020. FINDINGS: The heart size and mediastinal contours are within normal limits. Normal pulmonary vascularity. No focal consolidation, pleural effusion, or pneumothorax. No acute osseous abnormality. Old bilateral rib fractures again  noted. IMPRESSION: No active disease. Electronically Signed   By: Obie Dredge M.D.   On: 09/09/2022 13:00     Medications:     allopurinol  100 mg Oral BID   ARIPiprazole  15 mg Oral QPC breakfast   benazepril  20 mg Oral QPM   buPROPion  150 mg Oral Daily   cholecalciferol  1,000 Units Oral Daily   doxepin  100 mg Oral QHS   epoetin (EPOGEN/PROCRIT) injection  10,000 Units Intravenous Q T,Th,Sa-HD   feeding supplement (NEPRO CARB STEADY)  237  mL Oral Q24H   lamoTRIgine  25 mg Oral [id number]   loratadine  10 mg Oral Daily   potassium chloride  10 mEq Oral Daily   rosuvastatin  10 mg Oral Daily   senna-docusate  2 tablet Oral QPC breakfast   traZODone  100 mg Oral QHS   acetaminophen, alteplase, alum & mag hydroxide-simeth, droperidol, fentaNYL (SUBLIMAZE) injection, fluticasone, heparin, lidocaine (PF), lidocaine-prilocaine, loperamide, ondansetron (ZOFRAN) IV, pentafluoroprop-tetrafluoroeth, polyethylene glycol, promethazine  Assessment/ Plan:  Mr. Husam Hohn. is a 77 y.o.  male white male with end stage renal disease on hemodialysis, hypertension, gout, depression, history of melanoma, crohn's, and CVA who presents to [facility] on 07/30/2022 for Bipolar depression (HCC) [F31.9]   Principal Problem:   Bipolar depression (HCC) End-stage renal disease.   End Stage Renal Disease on hemodialysis: Receiving dialysis today, UF goal 1L as tolerated.  - add heparin for next treatment.  - discharge planning for outpatient clinic.  - potassium chloride PO daily.    Potassium [facility] vascular lab)  Date Value Ref Range Status  09/28/2021 3.6 3.5 - 5.1 mmol/L Final    Comment:    Performed at [facility], [address] [place], [place] [address]    Intake/Output Summary (Last 24 hours) at 09/11/2022 1152 Last data filed at 09/11/2022 1140 Gross per 24 hour  Intake 780 ml  Output 1350 ml  Net -570 ml     2. Hypertension with chronic kidney disease: Blood pressure 137/64 during dialysis.  - continue benazepril.    BP (!) 150/67 (BP Location: Right Arm)   Pulse 68   Temp 97.8 F (36.6 C) (Oral)   Resp 17   Ht 5\' 6"  (1.676 m)   Wt 81.2 kg   SpO2 100%   BMI 28.89 kg/m   3. Anemia of chronic kidney disease/ kidney injury/chronic disease/acute blood loss:   Lab Results  Component Value Date   HGB 9.6 (L) 09/11/2022  Hgb at goal. Continue EPO with dialysis   4. Secondary Hyperparathyroidism:     Lab  Results  Component Value Date   CALCIUM 9.0 09/11/2022   CAION 1.17 11/05/2021   PHOS 3.5 09/11/2022   Not currently on a vitamin D agent or a phosphate binder.      LOS: [address] [facility] 6/22/202411:52 AM

## 2022-09-11 NOTE — Progress Notes (Signed)
Patient is alert and oriented. Affect flat. Mood sad. Patient endorses depression and loneliness. Upon adm his daily medication, patient states, "I don't know how they expect me to remember how to take all these meds! I just can't go home right now." Support and encouragement provided. He denies SI/HI/AVH.  Dialysis and renal/cbc lab work completed today. No PRN's given. Discharge planning ongoing.  Q15 minute unit checks in place.

## 2022-09-11 NOTE — Plan of Care (Signed)
  Problem: Elimination: Goal: Will not experience complications related to bowel motility Outcome: Progressing Goal: Will not experience complications related to urinary retention Outcome: Progressing   Problem: Coping: Goal: Level of anxiety will decrease Outcome: Not Progressing   

## 2022-09-11 NOTE — Group Note (Signed)
Date:  09/11/2022 Time:  11:20 PM  Group Topic/Focus:  Building Self Esteem:   The Focus of this group is helping patients become aware of the effects of self-esteem on their lives, the things they and others do that enhance or undermine their self-esteem, seeing the relationship between their level of self-esteem and the choices they make and learning ways to enhance self-esteem.    Participation Level:  Active  Participation Quality:  Appropriate  Affect:  Appropriate  Cognitive:  Appropriate  Insight: Good  Engagement in Group:  Engaged  Modes of Intervention:  Support  Additional Comments:    Garry Heater 09/11/2022, 11:20 PM

## 2022-09-11 NOTE — Plan of Care (Signed)
Calm and cooperative. Flat affect. Visible in the dayroom. Limited interaction with peers. Attends group. Po med compliant. No behavior issues noted. Denies SI/HI/AVH. Q15 min checks maintained for safety. Plan of care continued.   Problem: Activity: Goal: Risk for activity intolerance will decrease Outcome: Progressing   Problem: Nutrition: Goal: Adequate nutrition will be maintained Outcome: Progressing   Problem: Coping: Goal: Level of anxiety will decrease Outcome: Progressing   Problem: Elimination: Goal: Will not experience complications related to bowel motility Outcome: Progressing Goal: Will not experience complications related to urinary retention Outcome: Progressing   Problem: Pain Managment: Goal: General experience of comfort will improve Outcome: Progressing   Problem: Safety: Goal: Ability to remain free from injury will improve Outcome: Progressing

## 2022-09-11 NOTE — Progress Notes (Signed)
Patient is off the unit to dialysis.

## 2022-09-11 NOTE — Progress Notes (Signed)
Patient has returned to the unit from dialysis.
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
## 2022-09-11 NOTE — Group Note (Unsigned)
Date:  09/11/2022 Time:  3:37 PM  Group Topic/Focus:  Healthy Communication:   The focus of this group is to discuss communication, barriers to communication, as well as healthy ways to communicate with others.     Participation Level:  {BHH PARTICIPATION LEVEL:22264}  Participation Quality:  {BHH PARTICIPATION QUALITY:22265}  Affect:  {BHH AFFECT:22266}  Cognitive:  {BHH COGNITIVE:22267}  Insight: {BHH Insight2:20797}  Engagement in Group:  {BHH ENGAGEMENT IN GROUP:22268}  Modes of Intervention:  {BHH MODES OF INTERVENTION:22269}  Additional Comments:  ***  Jeral Zick Y Ronny Ruddell 09/11/2022, 3:37 PM  

## 2022-09-11 NOTE — Group Note (Signed)
Date:  09/11/2022 Time:  3:54 PM  Group Topic/Focus:  Building Self Esteem:   The Focus of this group is helping patients become aware of the effects of self-esteem on their lives, the things they and others do that enhance or undermine their self-esteem, seeing the relationship between their level of self-esteem and the choices they make and learning ways to enhance self-esteem.    Participation Level:  Active  Participation Quality:  Appropriate and Attentive  Affect:  Appropriate  Cognitive:  Appropriate  Insight: Appropriate  Engagement in Group:  Engaged  Modes of Intervention:  Discussion  Additional Comments:     Anavictoria Wilk L Cela Newcom 09/11/2022, 3:54 PM

## 2022-09-11 NOTE — Progress Notes (Signed)
OT Cancellation Note  Patient Details Name: Luis Miller. MRN: [id number] DOB: December 06, 1945   Cancelled Treatment:    Reason Eval/Treat Not Completed: Patient at procedure or test/ unavailable. Attempted OT evaluation; however, pt is at HD presently. Will attempt rehab eval at a later time/date, as pt is available.  Latina Craver 09/11/2022, 10:33 AM

## 2022-09-12 MED ORDER — ARIPIPRAZOLE 5 MG PO TABS
20.0000 mg | ORAL_TABLET | Freq: Every day | ORAL | Status: DC
  Administered 2022-09-13 – 2022-09-23 (×11): 20 mg via ORAL
  Filled 2022-09-12 (×12): qty 4

## 2022-09-12 NOTE — Progress Notes (Signed)
Pt has been pleasant and cooperative,compliant with medications. Pt met with OT this afternoon. Pt denies ant S/I ,H/I or AVH. Pt has been social in milieu

## 2022-09-12 NOTE — Group Note (Signed)
[facility] LCSW Group Therapy Note   Group Date: 09/12/2022 Start Time: 1305 End Time: 1404   Type of Therapy/Topic:  Group Therapy:  Balance in Life  Participation Level:  Active   Description of Group:    This group will address the concept of balance and how it feels and looks when one is unbalanced. Patients will be encouraged to process areas in their lives that are out of balance, and identify reasons for remaining unbalanced. Facilitators will guide patients utilizing problem- solving interventions to address and correct the stressor making their life unbalanced. Understanding and applying boundaries will be explored and addressed for obtaining  and maintaining a balanced life. Patients will be encouraged to explore ways to assertively make their unbalanced needs known to significant others in their lives, using other group members and facilitator for support and feedback.  Therapeutic Goals: Patient will identify two or more emotions or situations they have that consume much of in their lives. Patient will identify signs/triggers that life has become out of balance:  Patient will identify two ways to set boundaries in order to achieve balance in their lives:  Patient will demonstrate ability to communicate their needs through discussion and/or role plays  Summary of Patient Progress:  The patient attended group. The patient answered the ice breaker by stating that he use to ride a motorcycle during his free time and now he goes dancing. The patient stated that he just does things to keep himself busy.    Therapeutic Modalities:   Cognitive Behavioral Therapy Solution-Focused Therapy Assertiveness Training   Marshell Levan, LCSW

## 2022-09-12 NOTE — Plan of Care (Signed)
D- Patient alert and oriented. Affect/mood. Denies SI, HI, AVH, and pain.   A- Scheduled medications administered to patient, per MD orders. Support and encouragement provided.  Routine safety checks conducted every 15 minutes.  Patient informed to notify staff with problems or concerns.  R- No adverse drug reactions noted. Patient contracts for safety at this time. Patient compliant with medications and treatment plan. Patient receptive, calm, and cooperative. Patient interacts well with others on the unit.  Patient remains safe at this time.  

## 2022-09-12 NOTE — BH IP Treatment Plan (Signed)
Interdisciplinary Treatment and Diagnostic Plan Update  09/12/2022 Time of Session: 10:13 am [name]. MRN: [id number]  Principal Diagnosis: Bipolar depression (HCC)  Secondary Diagnoses: Principal Problem:   Bipolar depression (HCC)   Current Medications:  Current Facility-Administered Medications  Medication Dose Route Frequency Provider Last Rate Last Admin   acetaminophen (TYLENOL) tablet 650 mg  650 mg Oral Q6H PRN Clapacs, [name], MD   650 mg at 09/02/22 1415   allopurinol (ZYLOPRIM) tablet 100 mg  100 mg Oral BID Clapacs, [name], MD   100 mg at 09/11/22 2141   alteplase (CATHFLO ACTIVASE) injection 2 mg  2 mg Intracatheter Once PRN Lateef, Munsoor, MD       alum & mag hydroxide-simeth (MAALOX/MYLANTA) 200-200-20 MG/5ML suspension 30 mL  30 mL Oral Q4H PRN Clapacs, [name], MD       ARIPiprazole (ABILIFY) tablet 15 mg  15 mg Oral QPC breakfast [name], DO   15 mg at 09/11/22 1227   benazepril (LOTENSIN) tablet 20 mg  20 mg Oral QPM Clapacs, [name], MD   20 mg at 09/09/22 1650   buPROPion (WELLBUTRIN XL) 24 hr tablet 150 mg  150 mg Oral Daily Clapacs, Jackquline Denmark, MD   150 mg at 09/11/22 1227   cholecalciferol (VITAMIN D3) 25 MCG (1000 UNIT) tablet 1,000 Units  1,000 Units Oral Daily Clapacs, Jackquline Denmark, MD   1,000 Units at 09/11/22 1228   doxepin (SINEQUAN) capsule 100 mg  100 mg Oral QHS Clapacs, [name], MD   100 mg at 09/11/22 2141   droperidol (INAPSINE) 2.5 MG/ML injection 0.625 mg  0.625 mg Intravenous Once PRN Yevette Edwards, MD       epoetin alfa (EPOGEN) injection 10,000 Units  10,000 Units Intravenous Q T,Th,Sa-HD Wendee Beavers, NP   10,000 Units at 09/11/22 1054   feeding supplement (NEPRO CARB STEADY) liquid 237 mL  237 mL Oral Q24H Singh, Harmeet, MD   237 mL at 09/10/22 1451   fentaNYL (SUBLIMAZE) injection 25 mcg  25 mcg Intravenous Q5 min PRN Darleene Cleaver, Gijsbertus F, MD       fluticasone (FLONASE) 50 MCG/ACT nasal spray 2 spray  2 spray Each Nare  Daily PRN Clapacs, [name], MD       heparin injection 1,000 Units  1,000 Units Dialysis PRN Cherylann Ratel, Munsoor, MD       lamoTRIgine (LAMICTAL) tablet 25 mg  25 mg Oral [id number] Herrick, Richard Edward, DO   25 mg at 09/11/22 1625   lidocaine (PF) (XYLOCAINE) 1 % injection 5 mL  5 mL Intradermal PRN Lateef, Munsoor, MD       lidocaine-prilocaine (EMLA) cream 1 Application  1 Application Topical PRN Lateef, Munsoor, MD   1 Application at 08/12/22 0700   loperamide (IMODIUM) capsule 2 mg  2 mg Oral PRN Clapacs, Jackquline Denmark, MD   2 mg at 08/28/22 1711   loratadine (CLARITIN) tablet 10 mg  10 mg Oral Daily Clapacs, Jackquline Denmark, MD   10 mg at 09/11/22 1228   ondansetron (ZOFRAN) injection 4 mg  4 mg Intravenous Once PRN Darleene Cleaver, Gerrit Heck, MD       pentafluoroprop-tetrafluoroeth (GEBAUERS) aerosol 1 Application  1 Application Topical PRN Lateef, Munsoor, MD       polyethylene glycol (MIRALAX / GLYCOLAX) packet 17 g  17 g Oral BID PRN Reggie Pile, MD   17 g at 08/23/22 1702   potassium chloride (KLOR-CON M) CR tablet 10 mEq  10 mEq Oral Daily [name], DO   10 mEq at 09/11/22 1228   promethazine (PHENERGAN) injection 6.25-12.5 mg  6.25-12.5 mg Intravenous Q15 min PRN Yevette Edwards, MD       rosuvastatin (CRESTOR) tablet 10 mg  10 mg Oral Daily Clapacs, Jackquline Denmark, MD   10 mg at 09/11/22 1228   senna-docusate (Senokot-S) tablet 2 tablet  2 tablet Oral QPC breakfast [name], DO   2 tablet at 09/11/22 1227   traZODone (DESYREL) tablet 100 mg  100 mg Oral QHS Clapacs, Jackquline Denmark, MD   100 mg at 09/11/22 2141   PTA Medications: Medications Prior to Admission  Medication Sig Dispense Refill Last Dose   amLODipine (NORVASC) 10 MG tablet TAKE 1 TABLET DAILY 90 tablet 1 08/19/2022   benazepril (LOTENSIN) 40 MG tablet Take 40 mg by mouth daily.    08/19/2022   calcitRIOL (ROCALTROL) 0.25 MCG capsule Take 0.25 mcg by mouth daily.   08/19/2022   FLUoxetine (PROZAC) 20 MG capsule Take 1 capsule  (20 mg total) by mouth daily. 90 capsule 0 08/19/2022   fluticasone (FLONASE) 50 MCG/ACT nasal spray Place 2 sprays into both nostrils daily. 16 g 6 08/19/2022   hydrALAZINE (APRESOLINE) 100 MG tablet Take 100 mg by mouth 3 (three) times daily.   08/19/2022   lamoTRIgine (LAMICTAL) 25 MG tablet TAKE 1 TABLET DAILY 90 tablet 0 08/19/2022   loperamide (IMODIUM) 2 MG capsule Take 2 mg by mouth daily as needed for diarrhea or loose stools.   08/19/2022   loratadine (CLARITIN) 10 MG tablet TAKE 1 TABLET BY MOUTH EVERY DAY 90 tablet 1 08/19/2022   Multiple Vitamin (MULTIVITAMIN) tablet Take 1 tablet by mouth daily.   08/19/2022   OLANZapine (ZYPREXA) 5 MG tablet Take 1 tablet (5 mg total) by mouth at bedtime. 90 tablet 0 08/19/2022   rosuvastatin (CRESTOR) 10 MG tablet TAKE 1 TABLET BY MOUTH EVERY DAY 90 tablet 0 08/19/2022   Vitamin D, Cholecalciferol, 25 MCG (1000 UT) TABS Take 1,000 tablets by mouth daily.    08/19/2022   allopurinol (ZYLOPRIM) 100 MG tablet Take 100 mg by mouth 2 (two) times daily.       Patient Stressors: Medication change or noncompliance    Patient Strengths: Ability for insight  Average or above average intelligence  Capable of independent living  Communication skills  Motivation for treatment/growth  Supportive family/friends   Treatment Modalities: Medication Management, Group therapy, Case management,  1 to 1 session with clinician, Psychoeducation, Recreational therapy.   Physician Treatment Plan for Primary Diagnosis: Bipolar depression (HCC) Long Term Goal(s): Improvement in symptoms so as ready for discharge   Short Term Goals: Compliance with prescribed medications will improve Ability to verbalize feelings will improve  Medication Management: Evaluate patient's response, side effects, and tolerance of medication regimen.  Therapeutic Interventions: 1 to 1 sessions, Unit Group sessions and Medication administration.  Evaluation of Outcomes:  Progressing  Physician Treatment Plan for Secondary Diagnosis: Principal Problem:   Bipolar depression (HCC)  Long Term Goal(s): Improvement in symptoms so as ready for discharge   Short Term Goals: Compliance with prescribed medications will improve Ability to verbalize feelings will improve     Medication Management: Evaluate patient's response, side effects, and tolerance of medication regimen.  Therapeutic Interventions: 1 to 1 sessions, Unit Group sessions and Medication administration.  Evaluation of Outcomes: Progressing   RN Treatment Plan for Primary Diagnosis: Bipolar depression (HCC) Long Term Goal(s): Knowledge of  disease and therapeutic regimen to maintain health will improve  Short Term Goals: Ability to participate in decision making will improve, Ability to verbalize feelings will improve, Ability to identify and develop effective coping behaviors will improve, and Compliance with prescribed medications will improve  Medication Management: RN will administer medications as ordered by provider, will assess and evaluate patient's response and provide education to patient for prescribed medication. RN will report any adverse and/or side effects to prescribing provider.  Therapeutic Interventions: 1 on 1 counseling sessions, Psychoeducation, Medication administration, Evaluate responses to treatment, Monitor vital signs and CBGs as ordered, Perform/monitor CIWA, COWS, AIMS and Fall Risk screenings as ordered, Perform wound care treatments as ordered.  Evaluation of Outcomes: Progressing   LCSW Treatment Plan for Primary Diagnosis: Bipolar depression (HCC) Long Term Goal(s): Safe transition to appropriate next level of care at discharge, Engage patient in therapeutic group addressing interpersonal concerns.  Short Term Goals: Engage patient in aftercare planning with referrals and resources, Increase social support, Increase ability to appropriately verbalize feelings,  Increase emotional regulation, Facilitate acceptance of mental health diagnosis and concerns, and Increase skills for wellness and recovery  Therapeutic Interventions: Assess for all discharge needs, 1 to 1 time with Social worker, Explore available resources and support systems, Assess for adequacy in community support network, Educate family and significant other(s) on suicide prevention, Complete Psychosocial Assessment, Interpersonal group therapy.  Evaluation of Outcomes: Progressing   Progress in Treatment: Attending groups: Yes. Participating in groups: Yes. Taking medication as prescribed: Yes. Toleration medication: Yes. Family/Significant other contact made: Yes, individual(s) contacted:  Elberta Leatherwood [phone] Patient understands diagnosis: Yes. Discussing patient identified problems/goals with staff: Yes. Medical problems stabilized or resolved: Yes. Denies suicidal/homicidal ideation: Yes. Issues/concerns per patient self-inventory: No. Other: none  New problem(s) identified: No, Describe:  none  New Short Term/Long Term Goal(s): Patient to work towards medication management for mood stabilization; elimination of SI thoughts; development of comprehensive mental wellness plan. Update 6/8: No update at this time.  Update 09/02/2022: No changes at this time. Update 09/07/2022:  No changes at this time. Update 6/23: none at this time.   Patient Goals:   No additional goals identified at this time. Patient to continue to work towards original goals identified in initial treatment team meeting. CSW will remain available to patient should they voice additional treatment goals. Update 6/8: The patient has not identified any new goals at this time. Update 09/02/2022: No changes at this time. Update 09/07/2022:  No changes at this time. Update 6/23: none at this time.   Discharge Plan or Barriers: No psychosocial barriers identified at this time, patient to return to place of  residence when appropriate for discharge. Update 6/8: There are no new barriers identified at this time. The patient will return home, the doctor has not provided a discharge date as of yet. Update 09/02/2022: CSW notes progress from patient. However,  patient does not acknowledge his progress.  Patient reports that he needs help at home, however, is not able to identify what support is needed.  The things identified patient was able to acknowledge that he addresses those things on his own at home and at the hospital.  Update 09/07/2022:  Patient remains somewhat apprehensive towards discharge. Patient reports that he will return home when it is time for him to be discharged.  Update 6/23: none at this time.    Reason for Continuation of Hospitalization: Anxiety Depression Medication stabilization Suicidal ideation  Estimated Length of Stay: 1-7 days  Update 6/8: TBD Update 09/02/2022: TBD Update 09/07/2022:  TBD Update 6/23: TBD  Last 3 [place] Suicide Severity Risk Score: Flowsheet Row Admission (Current) from 07/30/2022 in [facility] BE[facility] Most recent reading at 08/23/2022 12:20 PM ED from 07/30/2022 in [facility] Emergency Department at [facility] Most recent reading at 07/30/2022  3:10 PM Office Visit from 07/26/2022 in confidential department Most recent reading at 07/27/2022 11:27 AM  C-SSRS RISK CATEGORY No Risk No Risk No Risk       Last PHQ 2/9 Scores:    07/26/2022    9:10 AM 02/24/2022    1:14 PM 01/25/2022    1:34 PM  Depression screen PHQ 2/9  Decreased Interest 1 0 0  Down, Depressed, Hopeless 1 0 0  PHQ - 2 Score 2 0 0  Altered sleeping 0 0 0  Tired, decreased energy 0 0 0  Change in appetite 0 0 0  Feeling bad or failure about yourself  1 0 0  Trouble concentrating 0 0 0  Moving slowly or fidgety/restless 0 0 0  Suicidal thoughts 0 0 0  PHQ-9 Score 3 0 0  Difficult doing work/chores  Not difficult at all     Scribe for Treatment Team: Marshell Levan, LCSW 09/12/2022 10:13 AM

## 2022-09-12 NOTE — Group Note (Signed)
Date:  09/12/2022 Time:  8:56 PM  Group Topic/Focus:  Conflict Resolution:   The focus of this group is to discuss the conflict resolution process and how it may be used upon discharge.    Participation Level:  Active  Participation Quality:  Appropriate  Affect:  Appropriate  Cognitive:  Appropriate  Insight: Good  Engagement in Group:  Engaged  Modes of Intervention:  Discussion  Additional Comments:    Garry Heater 09/12/2022, 8:56 PM

## 2022-09-12 NOTE — Plan of Care (Signed)
Pt remains sad and depressed. Flat affect. Visible on the unit. Attends group. Po med compliant. Showered. No behavior issues. Denies SI/HI/AVH. Q 15 min checks maintained for safety. No c/o pain/discomfort noted.   Problem: Activity: Goal: Risk for activity intolerance will decrease Outcome: Progressing   Problem: Nutrition: Goal: Adequate nutrition will be maintained Outcome: Progressing   Problem: Coping: Goal: Level of anxiety will decrease Outcome: Progressing   Problem: Elimination: Goal: Will not experience complications related to bowel motility Outcome: Progressing Goal: Will not experience complications related to urinary retention Outcome: Progressing   Problem: Pain Managment: Goal: General experience of comfort will improve Outcome: Progressing

## 2022-09-12 NOTE — Evaluation (Signed)
Occupational Therapy Evaluation Patient Details Name: Luis Miller. 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 Presented to hospital 5/10 with depressed mood.   Clinical Impression   Patient received for OT evaluation. See flowsheet below for details of function. Pt participated in medication management assessment. Required increased time (8 minutes) and made one error. Required OT supervision and validation that pt was doing the right thing (pt wanting confirmation that he was reading the small print correctly, placing 3 pills per day in correct places, etc). Patient will benefit from continued OT while in acute care.       Recommendations for follow up therapy are one component of a multi-disciplinary discharge planning process, led by the attending physician.  Recommendations may be updated based on patient status, additional functional criteria and insurance authorization.   Assistance Recommended at Discharge Intermittent Supervision/Assistance  Patient can return home with the following Direct supervision/assist for medications management;Direct supervision/assist for financial management;Assist for transportation    Functional Status Assessment  Patient has had a recent decline in their functional status and demonstrates the ability to make significant improvements in function in a reasonable and predictable amount of time.  Equipment Recommendations  None recommended by OT    Recommendations for Other Services       Precautions / Restrictions Precautions Precautions: Other (comment) (in geropsych unit) Restrictions Weight Bearing Restrictions: No      Mobility Bed Mobility Overal bed mobility: Independent                  Transfers Overall transfer level: Independent Equipment used: None                       Balance Overall balance assessment: No apparent balance deficits (not formally assessed)                                         ADL either performed or assessed with clinical judgement   ADL Overall ADL's : Modified independent                                       General ADL Comments: Not explicitly tested today, but OT order for medication management testing. Pt able to walk in hallway without AD, no loss of balance; able to stand from chair without difficulty.     Vision Baseline Vision/History:  (wears glasses for reading. Keeps them in his pocket and used them during assessment today to read pill bottles.)       Perception     Praxis      Pertinent Vitals/Pain Pain Assessment Pain Assessment: No/denies pain     Hand Dominance Right   Extremity/Trunk Assessment Upper Extremity Assessment Upper Extremity Assessment: Overall WFL for tasks assessed   Lower Extremity Assessment Lower Extremity Assessment: Overall WFL for tasks assessed       Communication Communication Communication: HOH   Cognition Arousal/Alertness: Awake/alert Behavior During Therapy: WFL for tasks assessed/performed Overall Cognitive Status: No family/caregiver present to determine baseline cognitive functioning  General Comments: Patient is pleasant. Appears oriented, although not explicitly tested. Pt appearing with mildly flat affect, conversational, able to tell OT about how he used to work for [employer], then retired. Shares a few details of problems in his life recently.     General Comments  Pt participated in medication management assessment (with beads as pills and fake prescription bottles). Pt looking to OT for reassurance several times during assessment, such as when reading pill bottles (small print; pt using glasses) or when interpreting what they say (such as "3 times a day" and pt asking if  that can go in any 3 out of the 4 slots during the day in the pill sorter). Pt took 8 minutes for assessment. Made one mistake (accidentally put two of one type of pill into a slot) in pill distribution. Pt intermittently dropping a few pills (happened 3x during assessment); states that at home he puts a towel on the table so that pills do not bounce onto floor if that happens.  Discussed results with patient, and recommendation for [facility] services if pt returns home to ensure proper medication box filling and compliance.    Exercises     Shoulder Instructions      Home Living Family/patient expects to be discharged to:: Private residence Living Arrangements: Alone                               Additional Comments: Did not discuss specifics of pt's home living environment. He was (I) with ADLs prior to admission.      Prior Functioning/Environment Prior Level of Function : Independent/Modified Independent             Mobility Comments: Does not use AD. ADLs Comments: (I). Enjoys dancing on Friday nights in [place].        OT Problem List: Decreased cognition      OT Treatment/Interventions: Cognitive remediation/compensation    OT Goals(Current goals can be found in the care plan section) Acute Rehab OT Goals Patient Stated Goal: Get better OT Goal Formulation: With patient Time For Goal Achievement: 09/26/22 Potential to Achieve Goals: Good ADL Goals Additional ADL Goal #1: Patient will demonstrate MOD (I) in IADL tasks using cognitive strategies by d/c.  OT Frequency: Min 1X/week    Co-evaluation              AM-PAC OT "6 Clicks" Daily Activity     Outcome Measure Help from another person eating meals?: None Help from another person taking care of personal grooming?: None Help from another person toileting, which includes using toliet, bedpan, or urinal?: None Help from another person bathing (including washing, rinsing, drying)?: None Help from  another person to put on and taking off regular upper body clothing?: None Help from another person to put on and taking off regular lower body clothing?: None 6 Click Score: 24   End of Session Equipment Utilized During Treatment:  (none) Nurse Communication: Other (comment) (pt finished with OT assessment)  Activity Tolerance: Patient tolerated treatment well Patient left: Other (comment) (in hallway on geropsych unit)  OT Visit Diagnosis: Other symptoms and signs involving cognitive function                Time: 0981-1914 OT Time Calculation (min): 31 min Charges:  OT General Charges $OT Visit: 1 Visit OT Evaluation $OT Eval Moderate Complexity: 1 Mod OT Treatments $Cognitive Funtion inital: Initial 15 mins  Leighanna Kirn  Junie Panning, MS, OTR/L  Alvester Morin 09/12/2022, 4:24 PM
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
## 2022-09-13 NOTE — Progress Notes (Signed)
Pt is pleasant and cooperative. He has attended groups and is compliant with medications. Dialysis is scheduled tomorrow.

## 2022-09-13 NOTE — Group Note (Signed)
Recreation Therapy Group Note   Group Topic:Emotion Expression  Group Date: 09/13/2022 Start Time: 1400 End Time: 1450 Facilitators: Rosina Lowenstein, LRT, CTRS Location:  Day Room  Group Description: Positivity Collage. LRT and patients discussed the importance of having a positive mindset and being happy. Patients received magazines, safety scissors, a glue stick and a piece of paper. Pts were encouraged to find images or words in the magazines that showed "happiness" or positivity to them. Pt shared their collage with the group once they were finished. LRT and pts discussed how it can be difficult to always have a positive mindset, especially when they have mental health challenges.   Goal Area(s) Addressed:  Pt will identify things associate with positivity. Pt will reduce negative thinking. Pt will identify a new coping skill of thinking positive thoughts.   Affect/Mood: Appropriate and Flat   Participation Level: Active and Engaged   Participation Quality: Independent   Behavior: Calm and Cooperative   Speech/Thought Process: Coherent   Insight: Good   Judgement: Good   Modes of Intervention: Art and Activity   Patient Response to Interventions:  Attentive, Engaged, Interested , and Receptive   Education Outcome:  Acknowledges education   Clinical Observations/Individualized Feedback: [name] was active in their participation of session activities and group discussion. Pt identified "a cruise, food, the beach, and red lobster" as positive things that make him happy. Pt interacted well with LRT and peers duration of session.   Plan: Continue to engage patient in RT group sessions 2-3x/week.   Rosina Lowenstein, LRT, CTRS 09/13/2022 3:26 PM

## 2022-09-13 NOTE — Group Note (Signed)
Date:  09/13/2022 Time:  10:48 PM  Group Topic/Focus:  Building Self Esteem:   The Focus of this group is helping patients become aware of the effects of self-esteem on their lives, the things they and others do that enhance or undermine their self-esteem, seeing the relationship between their level of self-esteem and the choices they make and learning ways to enhance self-esteem. Coping With Mental Health Crisis:   The purpose of this group is to help patients identify strategies for coping with mental health crisis.  Group discusses possible causes of crisis and ways to manage them effectively. Crisis Planning:   The purpose of this group is to help patients create a crisis plan for use upon discharge or in the future, as needed. Developing a Wellness Toolbox:   The focus of this group is to help patients develop a "wellness toolbox" with skills and strategies to promote recovery upon discharge. Goals Group:   The focus of this group is to help patients establish daily goals to achieve during treatment and discuss how the patient can incorporate goal setting into their daily lives to aide in recovery. Healthy Communication:   The focus of this group is to discuss communication, barriers to communication, as well as healthy ways to communicate with others. Identifying Needs:   The focus of this group is to help patients identify their personal needs that have been historically problematic and identify healthy behaviors to address their needs. Making Healthy Choices:   The focus of this group is to help patients identify negative/unhealthy choices they were using prior to admission and identify positive/healthier coping strategies to replace them upon discharge. Overcoming Stress:   The focus of this group is to define stress and help patients assess their triggers. Personal Choices and Values:   The focus of this group is to help patients assess and explore the importance of values in their lives, how  their values affect their decisions, how they express their values and what opposes their expression. Recovery Goals:   The focus of this group is to identify appropriate goals for recovery and establish a plan to achieve them. Self Care:   The focus of this group is to help patients understand the importance of self-care in order to improve or restore emotional, physical, spiritual, interpersonal, and financial health.    Participation Level:  Active  Participation Quality:  Appropriate  Affect:  Appropriate  Cognitive:  Alert  Insight: Improving  Engagement in Group:  Developing/Improving, Engaged, and Supportive  Modes of Intervention:  Discussion  Additional Comments:    Luis Miller 09/13/2022, 10:48 PM

## 2022-09-13 NOTE — Progress Notes (Signed)
   09/13/22 2100  Psych Admission Type (Psych Patients Only)  Admission Status Voluntary  Psychosocial Assessment  Patient Complaints Depression  Eye Contact Fair  Facial Expression Flat;Sad  Affect Sullen  Speech Unremarkable  Interaction Assertive  Motor Activity Slow  Appearance/Hygiene In scrubs  Behavior Characteristics Cooperative;Appropriate to situation  Mood Depressed;Sad  Thought Process  Coherency WDL  Content WDL  Delusions None reported or observed  Perception WDL  Hallucination None reported or observed  Judgment Impaired  Confusion None  Danger to Self  Current suicidal ideation? Denies  Agreement Not to Harm Self Yes  Description of Agreement verbal  Danger to Others  Danger to Others None reported or observed
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
## 2022-09-13 NOTE — Plan of Care (Signed)
Calm and cooperative. Flat affect. Visible in the day. Visited with family member. Attends group. Po med compliant. No behavior issues noted. Denies SI/HI/AV/H. Q 15 min checks maintained for safety. No /co pain/discomfort noted.   Problem: Activity: Goal: Risk for activity intolerance will decrease Outcome: Progressing   Problem: Nutrition: Goal: Adequate nutrition will be maintained Outcome: Progressing   Problem: Coping: Goal: Level of anxiety will decrease Outcome: Progressing   Problem: Elimination: Goal: Will not experience complications related to bowel motility Outcome: Progressing Goal: Will not experience complications related to urinary retention Outcome: Progressing   Problem: Pain Managment: Goal: General experience of comfort will improve Outcome: Progressing

## 2022-09-13 NOTE — Progress Notes (Signed)
D- Patient alert and oriented. Pt is calm and cooperative Denies SI, HI, AVH, and pain.  A- Scheduled medications administered to patient, per MD orders. Support and encouragement provided.  Routine safety checks conducted every 15 minutes.  Patient informed to notify staff with problems or concerns. R- No adverse drug reactions noted. Patient contracts for safety at this time. Patient compliant with medications and treatment plan. Patient receptive, calm, and cooperative. Patient interacts well with others on the unit.  Patient remains safe at this time.

## 2022-09-14 DIAGNOSIS — F319 Bipolar disorder, unspecified: Secondary | ICD-10-CM | POA: Diagnosis not present

## 2022-09-14 LAB — CBC
HCT: 29.5 % — ABNORMAL LOW (ref 39.0–52.0)
Hemoglobin: 9.9 g/dL — ABNORMAL LOW (ref 13.0–17.0)
MCH: 34.5 pg — ABNORMAL HIGH (ref 26.0–34.0)
MCHC: 33.6 g/dL (ref 30.0–36.0)
MCV: 102.8 fL — ABNORMAL HIGH (ref 80.0–100.0)
Platelets: 148 10*3/uL — ABNORMAL LOW (ref 150–400)
RBC: 2.87 MIL/uL — ABNORMAL LOW (ref 4.22–5.81)
RDW: 15.2 % (ref 11.5–15.5)
WBC: 3.9 10*3/uL — ABNORMAL LOW (ref 4.0–10.5)
nRBC: 0 % (ref 0.0–0.2)

## 2022-09-14 LAB — RENAL FUNCTION PANEL
Albumin: 3.8 g/dL (ref 3.5–5.0)
Anion gap: 11 (ref 5–15)
BUN: 67 mg/dL — ABNORMAL HIGH (ref 8–23)
CO2: 25 mmol/L (ref 22–32)
Calcium: 9.1 mg/dL (ref 8.9–10.3)
Chloride: 101 mmol/L (ref 98–111)
Creatinine, Ser: 5.86 mg/dL — ABNORMAL HIGH (ref 0.61–1.24)
GFR, Estimated: 9 mL/min — ABNORMAL LOW (ref >60)
Glucose, Bld: 110 mg/dL — ABNORMAL HIGH (ref 70–99)
Phosphorus: 4 mg/dL (ref 2.5–4.6)
Potassium: 3.9 mmol/L (ref 3.5–5.1)
Sodium: 137 mmol/L (ref 135–145)

## 2022-09-14 MED ORDER — EPOETIN ALFA 10000 UNIT/ML IJ SOLN
INTRAMUSCULAR | Status: AC
  Filled 2022-09-14: qty 1

## 2022-09-14 NOTE — Group Note (Signed)
Date:  09/14/2022 Time:  2:53 PM  Group Topic/Focus:  Wellness Toolbox:   The focus of this group is to discuss various aspects of wellness, balancing those aspects and exploring ways to increase the ability to experience wellness.  Patients will create a wellness toolbox for use upon discharge.  Music Therapy   Participation Level:  Active  Participation Quality:  Appropriate  Affect:  Appropriate  Cognitive:  Alert and Appropriate  Insight: Appropriate  Engagement in Group:  Engaged  Modes of Intervention:  Discussion  Additional Comments:    Doug Sou 09/14/2022, 2:53 PM

## 2022-09-14 NOTE — Group Note (Signed)
Date:  09/14/2022 Time:  6:24 PM  Group Topic/Focus:  Coping With Mental Health Crisis:   The purpose of this group is to help patients identify strategies for coping with mental health crisis.  Group discusses possible causes of crisis and ways to manage them effectively.    Participation Level:  Active  Participation Quality:  Appropriate  Affect:  Appropriate  Cognitive:  Appropriate  Insight: Appropriate  Engagement in Group:  Engaged  Modes of Intervention:  Discussion  Additional Comments:     Luis Miller L Kardell Virgil 09/14/2022, 6:24 PM

## 2022-09-14 NOTE — Plan of Care (Signed)
D- Patient alert and oriented. Pt is cooperative with staff. Pt is present in milieu. Denies SI, HI, AVH, and pain. Marland Kitchen  A- Scheduled medications administered to patient, per MD orders. Support and encouragement provided.  Routine safety checks conducted every 15 minutes.  Patient informed to notify staff with problems or concerns. R- No adverse drug reactions noted. Patient contracts for safety at this time. Patient compliant with medications and treatment plan. Patient receptive, calm, and cooperative. Patient interacts well with others on the unit.  Patient remains safe at this time.

## 2022-09-14 NOTE — Progress Notes (Signed)
Pt has been in milieu this after noon and social with peers. Affect remains flat,depressed. Pt denies any thoughts of suicidal or homicidal ideation. He is able to contract for safety.

## 2022-09-14 NOTE — Group Note (Signed)
Recreation Therapy Group Note   Group Topic:Communication  Group Date: 09/14/2022 Start Time: 0900 End Time: 0950 Facilitators: Rosina Lowenstein, LRT, CTRS Location: Courtyard  Group Description: Reminisce Cards. Patients drew a laminated card out of a bag that had a word or phrase on it. Pt encouraged to speak about a time in their life or fond memory that specifically relates to the word they chose out of the bag. An example would be: "parenthood, meals, siblings, travel, or home".  LRT prompted following questions and encouraged contribution from peers to increase communication.   Goal Area(s) Addressed: Patient will increase verbal communication by conversing with peers. Patient will contribute to group discussion with minimal prompting. Patient will reminisce a positive memory or moment in their life  Affect/Mood: N/A   Participation Level: Did not attend    Clinical Observations/Individualized Feedback: Luis Miller did not attend due to being off unit.  Plan: Continue to engage patient in RT group sessions 2-3x/week.   Rosina Lowenstein, LRT, CTRS 09/14/2022 11:15 AM

## 2022-09-14 NOTE — Group Note (Signed)
Date:  09/14/2022 Time:  2:57 PM  Group Topic/Focus:  Coping With Mental Health Crisis:   The purpose of this group is to help patients identify strategies for coping with mental health crisis.  Group discusses possible causes of crisis and ways to manage them effectively.    Participation Level:  Did Not Attend   Doug Sou 09/14/2022, 2:57 PM

## 2022-09-14 NOTE — Group Note (Signed)
Date:  09/14/2022 Time:  3:06 PM  Group Topic/Focus:  Self Care:   The focus of this group is to help patients understand the importance of self-care in order to improve or restore emotional, physical, spiritual, interpersonal, and financial health.  Exercise and relaxation group..    Participation Level:  Did Not Attend   Geovani Tootle L Rosalita Carey 09/14/2022, 3:06 PM  

## 2022-09-14 NOTE — Progress Notes (Signed)
[facility]  Dialysis Note   Subjective:   Patient seen and evaluated during dialysis   HEMODIALYSIS FLOWSHEET:  Blood Flow Rate (mL/min): 350 mL/min Arterial Pressure (mmHg): -250 mmHg Venous Pressure (mmHg): 210 mmHg TMP (mmHg): 17 mmHg Ultrafiltration Rate (mL/min): 710 mL/min Dialysate Flow Rate (mL/min): 300 ml/min Dialysis Fluid Bolus: Normal Saline Bolus Amount (mL): 100 mL  Tolerating treatment well seated in chair  Objective:  Vital signs in last 24 hours:  Temp:  [97.5 F (36.4 C)-98.1 F (36.7 C)] 97.9 F (36.6 C) (06/25 1204) Pulse Rate:  [63-98] 75 (06/25 1204) Resp:  [13-17] 15 (06/25 1204) BP: (89-136)/(51-98) 129/76 (06/25 1204) SpO2:  [96 %-100 %] 100 % (06/25 1204) Weight:  [73.7 kg-81.1 kg] 80.5 kg (06/25 1227)  Weight change:  Filed Weights   09/14/22 0747 09/14/22 1204 09/14/22 1227  Weight: 81.1 kg 73.7 kg 80.5 kg    Intake/Output: I/O last 3 completed shifts: In: 330 [P.O.:330] Out: 900 [Urine:900]   Intake/Output this shift:  Total I/O In: -  Out: 1000 [Other:1000]  Physical Exam: General: NAD, flat affect   Head: Normocephalic, atraumatic. Moist oral mucosal membranes  Eyes: Anicteric  Lungs:  Clear to auscultation  Heart: Regular rate and rhythm  Abdomen:  Soft, nontender  Extremities:  Trace peripheral edema.  Neurologic: Alert, moving all four extremities  Skin: No lesions  Access: Lt AVF    Basic Metabolic Panel: Recent Labs  Lab 09/09/22 0842 09/11/22 0745 09/14/22 0814  NA 136 138 137  K 3.5 3.7 3.9  CL 100 102 101  CO2 24 25 25   GLUCOSE 205* 105* 110*  BUN 64* 62* 67*  CREATININE 5.67* 5.28* 5.86*  CALCIUM 9.0 9.0 9.1  PHOS 3.4 3.5 4.0     Liver Function Tests: Recent Labs  Lab 09/09/22 0842 09/11/22 0745 09/14/22 0814  ALBUMIN 3.8 3.8 3.8    No results for input(s): "LIPASE", "AMYLASE" in the last 168 hours. No results for input(s): "AMMONIA" in the last 168 hours.  CBC: Recent  Labs  Lab 09/09/22 0842 09/11/22 0745 09/14/22 0814  WBC 3.7* 3.8* 3.9*  HGB 9.6* 9.6* 9.9*  HCT 27.2* 28.3* 29.5*  MCV 101.9* 103.3* 102.8*  PLT 126* 128* 148*     Cardiac Enzymes: No results for input(s): "CKTOTAL", "CKMB", "CKMBINDEX", "TROPONINI" in the last 168 hours.  BNP: Invalid input(s): "POCBNP"  CBG: No results for input(s): "GLUCAP" in the last 168 hours.  Microbiology: Results for orders placed or performed in visit on 05/12/22  Microscopic Examination     Status: Abnormal   Collection Time: 05/12/22 10:39 AM   Urine  Result Value Ref Range Status   WBC, UA 6-10 (A) 0 - 5 /hpf Final   RBC, Urine 0-2 0 - 2 /hpf Final   Epithelial Cells (non renal) 0-10 0 - 10 /hpf Final   Bacteria, UA Moderate (A) None seen/Few Final    Coagulation Studies: No results for input(s): "LABPROT", "INR" in the last 72 hours.  Urinalysis: No results for input(s): "COLORURINE", "LABSPEC", "PHURINE", "GLUCOSEU", "HGBUR", "BILIRUBINUR", "KETONESUR", "PROTEINUR", "UROBILINOGEN", "NITRITE", "LEUKOCYTESUR" in the last 72 hours.  Invalid input(s): "APPERANCEUR"    Imaging: No results found.   Medications:     allopurinol  100 mg Oral BID   ARIPiprazole  20 mg Oral QPC breakfast   benazepril  20 mg Oral QPM   buPROPion  150 mg Oral Daily   cholecalciferol  1,000 Units Oral Daily   doxepin  100 mg  Oral QHS   epoetin (EPOGEN/PROCRIT) injection  10,000 Units Intravenous Q T,Th,Sa-HD   feeding supplement (NEPRO CARB STEADY)  237 mL Oral Q24H   lamoTRIgine  25 mg Oral [id number]   loratadine  10 mg Oral Daily   potassium chloride  10 mEq Oral Daily   rosuvastatin  10 mg Oral Daily   senna-docusate  2 tablet Oral QPC breakfast   traZODone  100 mg Oral QHS   acetaminophen, alteplase, alum & mag hydroxide-simeth, droperidol, fentaNYL (SUBLIMAZE) injection, fluticasone, heparin, lidocaine (PF), lidocaine-prilocaine, loperamide, ondansetron (ZOFRAN) IV,  pentafluoroprop-tetrafluoroeth, polyethylene glycol, promethazine  Assessment/ Plan:  [name]. is a 77 y.o.  male white male with end stage renal disease on hemodialysis, hypertension, gout, depression, history of melanoma, crohn's, and CVA who presents to [facility] on 07/30/2022 for Bipolar depression (HCC) [F31.9]   Principal Problem:   Bipolar depression (HCC) End-stage renal disease.  **Due to extended admission, patient will need to be re-established with outpatient clinic prior to discharge. Please contact renal navigator for discharge updates**   End Stage Renal Disease on hemodialysis: Receiving dialysis today, UF goal 1L as tolerated.  - add heparin for next treatment.  - Treatment received today, UF 1L as tolerated.  - Next treatment scheduled for Thursday   Potassium [facility] vascular lab)  Date Value Ref Range Status  09/28/2021 3.6 3.5 - 5.1 mmol/L Final    Comment:    Performed at [facility], [address] [place], [place] [address]    Intake/Output Summary (Last 24 hours) at 09/14/2022 1254 Last data filed at 09/14/2022 1204 Gross per 24 hour  Intake 300 ml  Output 1900 ml  Net -1600 ml     2. Hypertension with chronic kidney disease: Blood pressure 137/64 during dialysis.  - continue benazepril.   - Blood pressure 119/72 during dialysis   BP 129/76 (BP Location: Right Arm)   Pulse 75   Temp 97.9 F (36.6 C)   Resp 15   Ht 5\' 6"  (1.676 m)   Wt 80.5 kg   SpO2 100%   BMI 28.64 kg/m   3. Anemia of chronic kidney disease/ kidney injury/chronic disease/acute blood loss:   Lab Results  Component Value Date   HGB 9.9 (L) 09/14/2022  Hgb at goal. Continue EPO with dialysis   4. Secondary Hyperparathyroidism:     Lab Results  Component Value Date   CALCIUM 9.1 09/14/2022   CAION 1.17 11/05/2021   PHOS 4.0 09/14/2022   Not currently on a vitamin D agent or a phosphate binder.  Bone minerals acceptable     LOS: [address] [facility] 6/25/202412:54 PM
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
## 2022-09-14 NOTE — Group Note (Signed)
[facility] LCSW Group Therapy Note   Group Date: 09/14/2022 Start Time: 1315 End Time: 1400   Type of Therapy/Topic:  Group Therapy:  Balance in Life  Participation Level:  Active   Description of Group:    This group will address the concept of balance and how it feels and looks when one is unbalanced. Patients will be encouraged to process areas in their lives that are out of balance, and identify reasons for remaining unbalanced. Facilitators will guide patients utilizing problem- solving interventions to address and correct the stressor making their life unbalanced. Understanding and applying boundaries will be explored and addressed for obtaining  and maintaining a balanced life. Patients will be encouraged to explore ways to assertively make their unbalanced needs known to significant others in their lives, using other group members and facilitator for support and feedback.  Therapeutic Goals: Patient will identify two or more emotions or situations they have that consume much of in their lives. Patient will identify signs/triggers that life has become out of balance:  Patient will identify two ways to set boundaries in order to achieve balance in their lives:  Patient will demonstrate ability to communicate their needs through discussion and/or role plays  Summary of Patient Progress:    Pt states that balance for him could be "balancing my check book".  Pt also states that he thinks of balance as "standing straight up" or "keeping your balance". Pt was engaged with peers during session and was actively listening and involved.    Therapeutic Modalities:   Cognitive Behavioral Therapy Solution-Focused Therapy Assertiveness Training   Elza Rafter, [place]

## 2022-09-14 NOTE — Progress Notes (Signed)
   09/14/22 0558  15 Minute Checks  Location Bedroom  Visual Appearance Calm  Behavior Composed  Sleep (Behavioral Health Patients Only)  Calculate sleep? (Click Yes once per 24 hr at 0600 safety check) Yes  Documented sleep last 24 hours 7.25

## 2022-09-15 DIAGNOSIS — F319 Bipolar disorder, unspecified: Secondary | ICD-10-CM | POA: Diagnosis not present

## 2022-09-15 MED ORDER — AMPHETAMINE-DEXTROAMPHETAMINE 5 MG PO TABS: 10.0000 mg | ORAL_TABLET | Freq: Every day | ORAL | Status: DC

## 2022-09-15 NOTE — Group Note (Signed)
Date:  09/15/2022 Time:  10:04 AM  Group Topic/Focus:  Ice breaker & morning movement     This morning we introduced ourselves with a icebreaker then proceeded to talk about community & personal expectations while being on the [place] unit. Afterwards we proceeded to go outside for some fresh air while stretching, listening to music and play games.     Participation Level:  Active  Participation Quality:  Appropriate, Attentive, Sharing, and Supportive  Affect:  Appropriate  Cognitive:  Alert, Appropriate, and Oriented  Insight: Appropriate  Engagement in Group:  Engaged, Improving, and Supportive  Modes of Intervention:  Activity  Additional Comments:     Alexis Frock 09/15/2022, 10:04 AM

## 2022-09-15 NOTE — Progress Notes (Signed)
   09/15/22 2000  Psych Admission Type (Psych Patients Only)  Admission Status Voluntary  Psychosocial Assessment  Patient Complaints None  Eye Contact Fair  Facial Expression Flat  Affect Sullen  Speech Soft  Interaction Assertive  Motor Activity Slow  Appearance/Hygiene In scrubs  Behavior Characteristics Cooperative  Mood Sad  Thought Process  Coherency WDL  Content WDL  Delusions None reported or observed  Perception WDL  Hallucination None reported or observed  Judgment WDL  Confusion None  Danger to Self  Current suicidal ideation? Denies  Agreement Not to Harm Self Yes  Description of Agreement verbal  Danger to Others  Danger to Others None reported or observed

## 2022-09-15 NOTE — Progress Notes (Signed)
   09/15/22 1100  Psych Admission Type (Psych Patients Only)  Admission Status Voluntary  Psychosocial Assessment  Patient Complaints None  Eye Contact Fair  Facial Expression Flat  Affect Sullen  Speech Soft  Interaction Assertive  Motor Activity Slow  Appearance/Hygiene In scrubs  Behavior Characteristics Cooperative  Mood Sad  Thought Process  Coherency WDL  Content WDL  Delusions None reported or observed  Perception WDL  Hallucination None reported or observed  Judgment WDL  Confusion None  Danger to Self  Current suicidal ideation? Denies  Agreement Not to Harm Self Yes  Description of Agreement verbal

## 2022-09-15 NOTE — Progress Notes (Signed)
Patient remains cooperative with treatment, he was pleasant on approach. He still endorses depression but he denies SI, HI or AVH. No new behavioral issues to report on shift at this time.

## 2022-09-15 NOTE — Group Note (Signed)
Date:  09/15/2022 Time:  7:04 AM  Group Topic/Focus:  Building Self Esteem:   The Focus of this group is helping patients become aware of the effects of self-esteem on their lives, the things they and others do that enhance or undermine their self-esteem, seeing the relationship between their level of self-esteem and the choices they make and learning ways to enhance self-esteem. Coping With Mental Health Crisis:   The purpose of this group is to help patients identify strategies for coping with mental health crisis.  Group discusses possible causes of crisis and ways to manage them effectively. Developing a Wellness Toolbox:   The focus of this group is to help patients develop a "wellness toolbox" with skills and strategies to promote recovery upon discharge. Healthy Communication:   The focus of this group is to discuss communication, barriers to communication, as well as healthy ways to communicate with others. Identifying Needs:   The focus of this group is to help patients identify their personal needs that have been historically problematic and identify healthy behaviors to address their needs. Making Healthy Choices:   The focus of this group is to help patients identify negative/unhealthy choices they were using prior to admission and identify positive/healthier coping strategies to replace them upon discharge. Managing Feelings:   The focus of this group is to identify what feelings patients have difficulty handling and develop a plan to handle them in a healthier way upon discharge.    Participation Level:  Active  Participation Quality:  Appropriate  Affect:  Appropriate  Cognitive:  Appropriate  Insight: Appropriate  Engagement in Group:  Improving  Modes of Intervention:  Discussion  Additional Comments:    [name] 09/15/2022, 7:04 AM
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
## 2022-09-15 NOTE — Plan of Care (Signed)
  Problem: Pain Managment: Goal: General experience of comfort will improve Outcome: Progressing   Problem: Safety: Goal: Ability to remain free from injury will improve Outcome: Progressing   Problem: Skin Integrity: Goal: Risk for impaired skin integrity will decrease Outcome: Progressing   

## 2022-09-16 DIAGNOSIS — F319 Bipolar disorder, unspecified: Secondary | ICD-10-CM | POA: Diagnosis not present

## 2022-09-16 LAB — RENAL FUNCTION PANEL
Albumin: 3.9 g/dL (ref 3.5–5.0)
Anion gap: 11 (ref 5–15)
BUN: 65 mg/dL — ABNORMAL HIGH (ref 8–23)
CO2: 24 mmol/L (ref 22–32)
Calcium: 8.9 mg/dL (ref 8.9–10.3)
Chloride: 99 mmol/L (ref 98–111)
Creatinine, Ser: 5.26 mg/dL — ABNORMAL HIGH (ref 0.61–1.24)
GFR, Estimated: 11 mL/min — ABNORMAL LOW (ref >60)
Glucose, Bld: 106 mg/dL — ABNORMAL HIGH (ref 70–99)
Phosphorus: 4 mg/dL (ref 2.5–4.6)
Potassium: 3.9 mmol/L (ref 3.5–5.1)
Sodium: 134 mmol/L — ABNORMAL LOW (ref 135–145)

## 2022-09-16 LAB — CBC
HCT: 30.1 % — ABNORMAL LOW (ref 39.0–52.0)
Hemoglobin: 10.3 g/dL — ABNORMAL LOW (ref 13.0–17.0)
MCH: 35.2 pg — ABNORMAL HIGH (ref 26.0–34.0)
MCHC: 34.2 g/dL (ref 30.0–36.0)
MCV: 102.7 fL — ABNORMAL HIGH (ref 80.0–100.0)
Platelets: 137 10*3/uL — ABNORMAL LOW (ref 150–400)
RBC: 2.93 MIL/uL — ABNORMAL LOW (ref 4.22–5.81)
RDW: 15.2 % (ref 11.5–15.5)
WBC: 4 10*3/uL (ref 4.0–10.5)
nRBC: 0 % (ref 0.0–0.2)

## 2022-09-16 MED ORDER — AMPHETAMINE-DEXTROAMPHETAMINE 5 MG PO TABS
15.0000 mg | ORAL_TABLET | Freq: Two times a day (BID) | ORAL | Status: DC
  Administered 2022-09-17 – 2022-09-20 (×7): 15 mg via ORAL
  Filled 2022-09-16 (×7): qty 3

## 2022-09-16 MED ORDER — EPOETIN ALFA 4000 UNIT/ML IJ SOLN
4000.0000 [IU] | INTRAMUSCULAR | Status: DC
  Administered 2022-09-16 – 2022-09-24 (×4): 4000 [IU] via INTRAVENOUS
  Filled 2022-09-16: qty 1

## 2022-09-16 MED ORDER — EPOETIN ALFA 4000 UNIT/ML IJ SOLN
INTRAMUSCULAR | Status: AC
  Filled 2022-09-16: qty 1

## 2022-09-16 NOTE — Progress Notes (Signed)
   09/16/22 0601  15 Minute Checks  Location Bedroom  Visual Appearance Calm  Behavior Composed  Sleep (Behavioral Health Patients Only)  Calculate sleep? (Click Yes once per 24 hr at 0600 safety check) Yes  Documented sleep last 24 hours 5.75

## 2022-09-16 NOTE — Progress Notes (Signed)
  Received patient in bed to unit.   Informed consent signed and in chart.    TX duration:3.5hrs     Transported back to floor Hand-off given to patient's nurse. No c/o and no distress noted    Access used: LAVG Access issues: none   Total UF removed: 1.0 L Medication(s) given: 4,000u epogen  Post HD VS: 142/83 Post HD weight: 81.3kg     Lynann Beaver  [facility] D[facility] Unit

## 2022-09-16 NOTE — Group Note (Signed)
Date:  09/16/2022 Time:  10:23 PM  Group Topic/Focus:  Crisis Planning:   The purpose of this group is to help patients create a crisis plan for use upon discharge or in the future, as needed.    Participation Level:  Active  Participation Quality:  Appropriate  Affect:  Appropriate  Cognitive:  Appropriate  Insight: Appropriate  Engagement in Group:  Engaged  Modes of Intervention:  Support  Additional Comments:    Garry Heater 09/16/2022, 10:23 PM

## 2022-09-16 NOTE — Progress Notes (Signed)
   09/16/22 2200  Psych Admission Type (Psych Patients Only)  Admission Status Voluntary  Psychosocial Assessment  Patient Complaints None  Eye Contact Fair  Facial Expression Flat  Affect Sullen  Speech Soft  Interaction Assertive  Motor Activity Slow  Appearance/Hygiene In scrubs  Behavior Characteristics Cooperative  Mood Sad  Thought Process  Coherency WDL  Content WDL  Delusions None reported or observed  Perception WDL  Hallucination None reported or observed  Judgment WDL  Confusion None  Danger to Self  Current suicidal ideation? Denies  Agreement Not to Harm Self Yes  Description of Agreement verbal  Danger to Others  Danger to Others None reported or observed

## 2022-09-16 NOTE — Progress Notes (Signed)
Patient is A+O x 4. He denies SI/HI/AVH. Affect sullen. Mood sad. Appetite good. Pain 0/10. Medications adm without difficulty. Patient went to dialysis today.   CBC/renal labs completed today. Patient remains on strict input and output. Discharge planning ongoing.  Q15 minute unit checks in place.

## 2022-09-16 NOTE — Group Note (Signed)
Date:  09/16/2022 Time:  10:25 AM  Group Topic/Focus:  Crisis Planning:   The purpose of this group is to help patients create a crisis plan for use upon discharge or in the future, as needed.    Participation Level:  Did Not Attend  Participation Quality:    Affect:    Cognitive:    Insight:   Engagement in Group:    Modes of Intervention:    Additional Comments:  Did not attend  Lavera Vandermeer T Mailynn Everly 09/16/2022, 10:25 AM
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
## 2022-09-16 NOTE — Group Note (Signed)
Recreation Therapy Group Note   Group Topic:Health and Wellness  Group Date: 09/16/2022 Start Time: 1400 End Time: 1455 Facilitators: Rosina Lowenstein, LRT, CTRS Location:  Day Room  Group Description: Seated Exercise. LRT discussed the mental and physical benefits of exercise. LRT and group discussed how physical activity can be used as a coping skill. Pt's and LRT followed along to an exercise video on the TV screen that provided a visual representation and audio description of every exercise performed. Pt's encouraged to listen to their bodies and stop at any time if they experience feelings of discomfort or pain. LRT passed out water after session was over and encouraged pts do drink and stay hydrated.  Goal Area(s) Addressed: Patient will learn benefits of physical activity. Patient will identify exercise as a coping skill.  Patient will follow multistep directions. Patient will try a new leisure interest.   Affect/Mood: Appropriate   Participation Level: Active and Engaged   Participation Quality: Independent   Behavior: Calm and Cooperative   Speech/Thought Process: Coherent   Insight: Good   Judgement: Good   Modes of Intervention: Activity   Patient Response to Interventions:  Attentive, Engaged, Interested , and Receptive   Education Outcome:  Acknowledges education   Clinical Observations/Individualized Feedback: Luis Miller was active in their participation of session activities and group discussion. Pt completed almost all exercises appropriately while interacting well with LRT and peers duration of session.   Plan: Continue to engage patient in RT group sessions 2-3x/week.   Rosina Lowenstein, LRT, CTRS 09/16/2022 3:46 PM

## 2022-09-16 NOTE — Progress Notes (Signed)
[facility]  Dialysis Note   Subjective:   Patient seen and evaluated during dialysis   HEMODIALYSIS FLOWSHEET:  Blood Flow Rate (mL/min): 300 mL/min Arterial Pressure (mmHg): -120 mmHg Venous Pressure (mmHg): 290 mmHg TMP (mmHg): 16 mmHg Ultrafiltration Rate (mL/min): 481 mL/min Dialysate Flow Rate (mL/min): 300 ml/min Dialysis Fluid Bolus: Normal Saline Bolus Amount (mL): 100 mL  Seated in chair Completed breakfast  Objective:  Vital signs in last 24 hours:  Temp:  [97.8 F (36.6 C)-98.6 F (37 C)] 97.8 F (36.6 C) (06/27 0742) Pulse Rate:  [69-89] 75 (06/27 1130) Resp:  [14-19] 16 (06/27 1130) BP: (96-139)/(69-84) 124/74 (06/27 1130) SpO2:  [95 %-100 %] 100 % (06/27 1130) Weight:  [81.3 kg] 81.3 kg (06/27 0742)  Weight change:  Filed Weights   09/14/22 1204 09/14/22 1227 09/16/22 0742  Weight: 73.7 kg 80.5 kg 81.3 kg    Intake/Output: I/O last 3 completed shifts: In: 900 [P.O.:900] Out: 1025 [Urine:1025]   Intake/Output this shift:  No intake/output data recorded.  Physical Exam: General: NAD, flat affect   Head: Normocephalic, atraumatic. Moist oral mucosal membranes  Eyes: Anicteric  Lungs:  Clear to auscultation  Heart: Regular rate and rhythm  Abdomen:  Soft, nontender  Extremities:  Trace peripheral edema.  Neurologic: Alert, moving all four extremities  Skin: No lesions  Access: Lt AVF    Basic Metabolic Panel: Recent Labs  Lab 09/11/22 0745 09/14/22 0814 09/16/22 0758  NA 138 137 134*  K 3.7 3.9 3.9  CL 102 101 99  CO2 25 25 24   GLUCOSE 105* 110* 106*  BUN 62* 67* 65*  CREATININE 5.28* 5.86* 5.26*  CALCIUM 9.0 9.1 8.9  PHOS 3.5 4.0 4.0     Liver Function Tests: Recent Labs  Lab 09/11/22 0745 09/14/22 0814 09/16/22 0758  ALBUMIN 3.8 3.8 3.9    No results for input(s): "LIPASE", "AMYLASE" in the last 168 hours. No results for input(s): "AMMONIA" in the last 168 hours.  CBC: Recent Labs  Lab  09/11/22 0745 09/14/22 0814 09/16/22 0758  WBC 3.8* 3.9* 4.0  HGB 9.6* 9.9* 10.3*  HCT 28.3* 29.5* 30.1*  MCV 103.3* 102.8* 102.7*  PLT 128* 148* 137*     Cardiac Enzymes: No results for input(s): "CKTOTAL", "CKMB", "CKMBINDEX", "TROPONINI" in the last 168 hours.  BNP: Invalid input(s): "POCBNP"  CBG: No results for input(s): "GLUCAP" in the last 168 hours.  Microbiology: Results for orders placed or performed in visit on 05/12/22  Microscopic Examination     Status: Abnormal   Collection Time: 05/12/22 10:39 AM   Urine  Result Value Ref Range Status   WBC, UA 6-10 (A) 0 - 5 /hpf Final   RBC, Urine 0-2 0 - 2 /hpf Final   Epithelial Cells (non renal) 0-10 0 - 10 /hpf Final   Bacteria, UA Moderate (A) None seen/Few Final    Coagulation Studies: No results for input(s): "LABPROT", "INR" in the last 72 hours.  Urinalysis: No results for input(s): "COLORURINE", "LABSPEC", "PHURINE", "GLUCOSEU", "HGBUR", "BILIRUBINUR", "KETONESUR", "PROTEINUR", "UROBILINOGEN", "NITRITE", "LEUKOCYTESUR" in the last 72 hours.  Invalid input(s): "APPERANCEUR"    Imaging: No results found.   Medications:     allopurinol  100 mg Oral BID   amphetamine-dextroamphetamine  10 mg Oral Q breakfast   ARIPiprazole  20 mg Oral QPC breakfast   benazepril  20 mg Oral QPM   buPROPion  150 mg Oral Daily   cholecalciferol  1,000 Units Oral Daily   doxepin  100 mg Oral QHS   epoetin (EPOGEN/PROCRIT) injection  10,000 Units Intravenous Q T,Th,Sa-HD   feeding supplement (NEPRO CARB STEADY)  237 mL Oral Q24H   lamoTRIgine  25 mg Oral [id number]   loratadine  10 mg Oral Daily   potassium chloride  10 mEq Oral Daily   rosuvastatin  10 mg Oral Daily   senna-docusate  2 tablet Oral QPC breakfast   traZODone  100 mg Oral QHS   acetaminophen, alteplase, alum & mag hydroxide-simeth, droperidol, fentaNYL (SUBLIMAZE) injection, fluticasone, heparin, lidocaine (PF), lidocaine-prilocaine, loperamide,  ondansetron (ZOFRAN) IV, pentafluoroprop-tetrafluoroeth, polyethylene glycol, promethazine  Assessment/ Plan:  [name]. is a 77 y.o.  male white male with end stage renal disease on hemodialysis, hypertension, gout, depression, history of melanoma, crohn's, and CVA who presents to [facility] on 07/30/2022 for Bipolar depression (HCC) [F31.9]   Principal Problem:   Bipolar depression (HCC) End-stage renal disease.  **Due to extended admission, patient will need to be re-established with outpatient clinic prior to discharge. Please contact renal navigator for discharge updates**   End Stage Renal Disease on hemodialysis: Receiving dialysis today, UF goal 1L as tolerated.  - Continue heparin as needed - Receiving dialysis with UF goal 1L - Next treatment scheduled for Saturday   Potassium [facility] vascular lab)  Date Value Ref Range Status  09/28/2021 3.6 3.5 - 5.1 mmol/L Final    Comment:    Performed at [facility], [address] [place], [place] [address]    Intake/Output Summary (Last 24 hours) at 09/16/2022 1137 Last data filed at 09/15/2022 1800 Gross per 24 hour  Intake 540 ml  Output 375 ml  Net 165 ml     2. Hypertension with chronic kidney disease: Blood pressure 137/64 during dialysis.  - continue benazepril.   - Blood pressure stable   BP 124/74   Pulse 75   Temp 97.8 F (36.6 C) (Oral)   Resp 16   Ht 5\' 6"  (1.676 m)   Wt 81.3 kg   SpO2 100%   BMI 28.93 kg/m   3. Anemia of chronic kidney disease/ kidney injury/chronic disease/acute blood loss:   Lab Results  Component Value Date   HGB 10.3 (L) 09/16/2022  Hgb acceptable.Will decrease to low dose EPO with dialysis   4. Secondary Hyperparathyroidism:     Lab Results  Component Value Date   CALCIUM 8.9 09/16/2022   CAION 1.17 11/05/2021   PHOS 4.0 09/16/2022   Not currently on a vitamin D agent or a phosphate binder.  Bone minerals acceptable     LOS: 48 [facility] 6/27/202411:37 AM
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
## 2022-09-16 NOTE — Plan of Care (Signed)
  Problem: Education: Goal: Knowledge of General Education information will improve Description: Including pain rating scale, medication(s)/side effects and non-pharmacologic comfort measures Outcome: Progressing   Problem: Health Behavior/Discharge Planning: Goal: Ability to manage health-related needs will improve Outcome: Progressing   Problem: Clinical Measurements: Goal: Will remain free from infection Outcome: Progressing   

## 2022-09-17 DIAGNOSIS — F319 Bipolar disorder, unspecified: Secondary | ICD-10-CM | POA: Diagnosis not present
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
## 2022-09-17 NOTE — Progress Notes (Signed)
   09/17/22 0726  Psych Admission Type (Psych Patients Only)  Admission Status Voluntary  Psychosocial Assessment  Patient Complaints None  Eye Contact Fair  Facial Expression Flat  Affect Sullen  Speech Soft  Interaction Assertive  Motor Activity Slow  Appearance/Hygiene In scrubs  Behavior Characteristics Cooperative  Mood Sad  Thought Process  Coherency WDL  Content WDL  Delusions None reported or observed  Perception WDL  Hallucination None reported or observed  Judgment WDL  Confusion None  Danger to Self  Current suicidal ideation? Denies  Danger to Others  Danger to Others None reported or observed

## 2022-09-17 NOTE — Plan of Care (Signed)

## 2022-09-17 NOTE — Group Note (Signed)
Recreation Therapy Group Note   Group Topic:Leisure Education  Group Date: 09/17/2022 Start Time: 1400 End Time: 1455 Facilitators: Rosina Lowenstein, LRT, CTRS Location:  Dayroom  Group Description: Leisure. Patients were given the option to choose from coloring, singing karaoke, or playing cards. LRT and pts discussed the meaning of leisure, the importance of participating in leisure during their free time/when they're outside of the hospital, as well as how our leisure interests can also serve as coping skills. Pt identified two leisure interests and shared with the group.   Goal Area(s) Addressed:  Patient will identify a current leisure interest.  Patient will learn the definition of "leisure". Patient will practice making a positive decision. Patient will have the opportunity to try a new leisure activity. Patient will communicate with peers and LRT.    Affect/Mood: Appropriate   Participation Level: Minimal   Participation Quality: Independent   Behavior: Calm and On-looking   Speech/Thought Process: Coherent   Insight: Good   Judgement: Good   Modes of Intervention: Activity   Patient Response to Interventions:  Receptive   Education Outcome:  Acknowledges education   Clinical Observations/Individualized Feedback: Luis Miller was somewhat active in their participation of session activities and group discussion. Pt identified "watch movies and go out to eat" as things that he does in his free time. Pt sat and listened to music while in group. Pt interacted well with LRT and peers duration of session.   Plan: Continue to engage patient in RT group sessions 2-3x/week.   Rosina Lowenstein, LRT, CTRS 09/17/2022 3:51 PM

## 2022-09-17 NOTE — Progress Notes (Signed)
Occupational Therapy Treatment Patient Details Name: Luis Miller. 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 Presented to hospital 5/10 with depressed mood.   OT comments  Luis Miller was seen for OT treatment on this date. Upon arrival to room pt seated in dayroom, agreeable to tx. Pt continues to be IND for ADLs. Discussed home safety concerns and pt appropriately stated how to use a fire extinguisher, when to call 911, etc. Pt reports renewing his drivers license and potentially losing trampsortation to dialysis and grocery store as concerns for d/c. Pt reports plan to discuss with CSW prior to d/c. Goals met, no skilled acute OT needs identified. Will sing off. Discharge recommendation remains appropriate.     Recommendations for follow up therapy are one component of a multi-disciplinary discharge planning process, led by the attending physician.  Recommendations may be updated based on patient status, additional functional criteria and insurance authorization.    Assistance Recommended at Discharge Intermittent Supervision/Assistance  Patient can return home with the following  Direct supervision/assist for medications management;Direct supervision/assist for financial management;Assist for transportation   Equipment Recommendations  None recommended by OT    Recommendations for Other Services      Precautions / Restrictions Precautions Precautions: None Restrictions Weight Bearing Restrictions: No       Mobility Bed Mobility Overal bed mobility: Independent                  Transfers Overall transfer level: Independent                       Balance Overall balance assessment: Independent                                         ADL either performed or assessed with clinical  judgement   ADL Overall ADL's : Independent                                             Cognition Arousal/Alertness: Awake/alert Behavior During Therapy: WFL for tasks assessed/performed Overall Cognitive Status: Within Functional Limits for tasks assessed                                                     Pertinent Vitals/ Pain       Pain Assessment Pain Assessment: No/denies pain   Frequency  Min 1X/week        Progress Toward Goals  OT Goals(current goals can now be found in the care plan section)  Progress towards OT goals: Goals met/education completed, patient discharged from OT  Acute Rehab OT Goals Patient Stated Goal: go home OT Goal Formulation: With patient Time For Goal Achievement: 09/26/22 Potential to Achieve Goals: Good ADL Goals Additional ADL Goal #1: Patient will demonstrate MOD (I) in IADL tasks using cognitive strategies by d/c.  Plan All goals met and education completed, patient discharged from OT services    Co-evaluation  AM-PAC OT "6 Clicks" Daily Activity     Outcome Measure   Help from another person eating meals?: None Help from another person taking care of personal grooming?: None Help from another person toileting, which includes using toliet, bedpan, or urinal?: None Help from another person bathing (including washing, rinsing, drying)?: None Help from another person to put on and taking off regular upper body clothing?: None Help from another person to put on and taking off regular lower body clothing?: None 6 Click Score: 24    End of Session    OT Visit Diagnosis: Other symptoms and signs involving cognitive function   Activity Tolerance Patient tolerated treatment well   Patient Left in chair (in day room)   Nurse Communication          Time: 4098-1191 OT Time Calculation (min): 12 min  Charges: OT General Charges $OT Visit: 1 Visit OT  Treatments $Self Care/Home Management : 8-22 mins  Kathie Dike, M.S. OTR/L  09/17/22, 12:48 PM  ascom [phone]

## 2022-09-17 NOTE — BH IP Treatment Plan (Signed)
Interdisciplinary Treatment and Diagnostic Plan Update  09/17/2022 Time of Session: 9:00 AM  Luis Miller. MRN: [id number]  Principal Diagnosis: Bipolar depression (HCC)  Secondary Diagnoses: Principal Problem:   Bipolar depression (HCC)   Current Medications:  Current Facility-Administered Medications  Medication Dose Route Frequency Provider Last Rate Last Admin   acetaminophen (TYLENOL) tablet 650 mg  650 mg Oral Q6H PRN Clapacs, [name], MD   650 mg at 09/02/22 1415   allopurinol (ZYLOPRIM) tablet 100 mg  100 mg Oral BID Clapacs, [name], MD   100 mg at 09/17/22 0920   alteplase (CATHFLO ACTIVASE) injection 2 mg  2 mg Intracatheter Once PRN Lateef, Munsoor, MD       alum & mag hydroxide-simeth (MAALOX/MYLANTA) 200-200-20 MG/5ML suspension 30 mL  30 mL Oral Q4H PRN Clapacs, [name], MD       amphetamine-dextroamphetamine (ADDERALL) tablet 15 mg  15 mg Oral BID WC [name], DO   15 mg at 09/17/22 0920   ARIPiprazole (ABILIFY) tablet 20 mg  20 mg Oral QPC breakfast Lewanda Rife, MD   20 mg at 09/17/22 0920   benazepril (LOTENSIN) tablet 20 mg  20 mg Oral QPM Clapacs, [name], MD   20 mg at 09/16/22 1832   buPROPion (WELLBUTRIN XL) 24 hr tablet 150 mg  150 mg Oral Daily Clapacs, Jackquline Denmark, MD   150 mg at 09/17/22 0920   cholecalciferol (VITAMIN D3) 25 MCG (1000 UNIT) tablet 1,000 Units  1,000 Units Oral Daily Clapacs, Jackquline Denmark, MD   1,000 Units at 09/17/22 0920   doxepin (SINEQUAN) capsule 100 mg  100 mg Oral QHS Clapacs, [name], MD   100 mg at 09/16/22 2140   droperidol (INAPSINE) 2.5 MG/ML injection 0.625 mg  0.625 mg Intravenous Once PRN Yevette Edwards, MD       epoetin alfa (EPOGEN) injection 4,000 Units  4,000 Units Intravenous Q T,Th,Sa-HD Wendee Beavers, NP   4,000 Units at 09/16/22 1145   feeding supplement (NEPRO CARB STEADY) liquid 237 mL  237 mL Oral Q24H Thedore Mins, Harmeet, MD   237 mL at 09/17/22 1223   fentaNYL (SUBLIMAZE) injection 25 mcg  25 mcg Intravenous  Q5 min PRN Darleene Cleaver, Gijsbertus F, MD       fluticasone (FLONASE) 50 MCG/ACT nasal spray 2 spray  2 spray Each Nare Daily PRN Clapacs, [name], MD       heparin injection 1,000 Units  1,000 Units Dialysis PRN Cherylann Ratel, Munsoor, MD       lamoTRIgine (LAMICTAL) tablet 25 mg  25 mg Oral [id number] Herrick, Richard Edward, DO   25 mg at 09/17/22 0920   lidocaine (PF) (XYLOCAINE) 1 % injection 5 mL  5 mL Intradermal PRN Lateef, Munsoor, MD       lidocaine-prilocaine (EMLA) cream 1 Application  1 Application Topical PRN Lateef, Munsoor, MD   1 Application at 08/12/22 0700   loperamide (IMODIUM) capsule 2 mg  2 mg Oral PRN Clapacs, Jackquline Denmark, MD   2 mg at 08/28/22 1711   loratadine (CLARITIN) tablet 10 mg  10 mg Oral Daily Clapacs, Jackquline Denmark, MD   10 mg at 09/17/22 0919   ondansetron (ZOFRAN) injection 4 mg  4 mg Intravenous Once PRN Darleene Cleaver, Gerrit Heck, MD       pentafluoroprop-tetrafluoroeth (GEBAUERS) aerosol 1 Application  1 Application Topical PRN Lateef, Munsoor, MD       polyethylene glycol (MIRALAX / GLYCOLAX) packet 17 g  17  g Oral BID PRN Reggie Pile, MD   17 g at 08/23/22 1702   potassium chloride (KLOR-CON M) CR tablet 10 mEq  10 mEq Oral Daily [name], DO   10 mEq at 09/17/22 0919   promethazine (PHENERGAN) injection 6.25-12.5 mg  6.25-12.5 mg Intravenous Q15 min PRN Yevette Edwards, MD       rosuvastatin (CRESTOR) tablet 10 mg  10 mg Oral Daily Clapacs, Jackquline Denmark, MD   10 mg at 09/17/22 0920   senna-docusate (Senokot-S) tablet 2 tablet  2 tablet Oral QPC breakfast [name], DO   2 tablet at 09/17/22 0920   traZODone (DESYREL) tablet 100 mg  100 mg Oral QHS Clapacs, Jackquline Denmark, MD   100 mg at 09/16/22 2140   PTA Medications: Medications Prior to Admission  Medication Sig Dispense Refill Last Dose   amLODipine (NORVASC) 10 MG tablet TAKE 1 TABLET DAILY 90 tablet 1 08/19/2022   benazepril (LOTENSIN) 40 MG tablet Take 40 mg by mouth daily.    08/19/2022   calcitRIOL  (ROCALTROL) 0.25 MCG capsule Take 0.25 mcg by mouth daily.   08/19/2022   FLUoxetine (PROZAC) 20 MG capsule Take 1 capsule (20 mg total) by mouth daily. 90 capsule 0 08/19/2022   fluticasone (FLONASE) 50 MCG/ACT nasal spray Place 2 sprays into both nostrils daily. 16 g 6 08/19/2022   hydrALAZINE (APRESOLINE) 100 MG tablet Take 100 mg by mouth 3 (three) times daily.   08/19/2022   lamoTRIgine (LAMICTAL) 25 MG tablet TAKE 1 TABLET DAILY 90 tablet 0 08/19/2022   loperamide (IMODIUM) 2 MG capsule Take 2 mg by mouth daily as needed for diarrhea or loose stools.   08/19/2022   loratadine (CLARITIN) 10 MG tablet TAKE 1 TABLET BY MOUTH EVERY DAY 90 tablet 1 08/19/2022   Multiple Vitamin (MULTIVITAMIN) tablet Take 1 tablet by mouth daily.   08/19/2022   OLANZapine (ZYPREXA) 5 MG tablet Take 1 tablet (5 mg total) by mouth at bedtime. 90 tablet 0 08/19/2022   rosuvastatin (CRESTOR) 10 MG tablet TAKE 1 TABLET BY MOUTH EVERY DAY 90 tablet 0 08/19/2022   Vitamin D, Cholecalciferol, 25 MCG (1000 UT) TABS Take 1,000 tablets by mouth daily.    08/19/2022   allopurinol (ZYLOPRIM) 100 MG tablet Take 100 mg by mouth 2 (two) times daily.       Patient Stressors: Medication change or noncompliance    Patient Strengths: Ability for insight  Average or above average intelligence  Capable of independent living  Communication skills  Motivation for treatment/growth  Supportive family/friends   Treatment Modalities: Medication Management, Group therapy, Case management,  1 to 1 session with clinician, Psychoeducation, Recreational therapy.   Physician Treatment Plan for Primary Diagnosis: Bipolar depression (HCC) Long Term Goal(s): Improvement in symptoms so as ready for discharge   Short Term Goals: Compliance with prescribed medications will improve Ability to verbalize feelings will improve  Medication Management: Evaluate patient's response, side effects, and tolerance of medication regimen.  Therapeutic  Interventions: 1 to 1 sessions, Unit Group sessions and Medication administration.  Evaluation of Outcomes: Progressing  Physician Treatment Plan for Secondary Diagnosis: Principal Problem:   Bipolar depression (HCC)  Long Term Goal(s): Improvement in symptoms so as ready for discharge   Short Term Goals: Compliance with prescribed medications will improve Ability to verbalize feelings will improve     Medication Management: Evaluate patient's response, side effects, and tolerance of medication regimen.  Therapeutic Interventions: 1 to 1 sessions, Unit Group  sessions and Medication administration.  Evaluation of Outcomes: Progressing   RN Treatment Plan for Primary Diagnosis: Bipolar depression (HCC) Long Term Goal(s): Knowledge of disease and therapeutic regimen to maintain health will improve  Short Term Goals: Ability to remain free from injury will improve, Ability to verbalize frustration and anger appropriately will improve, Ability to demonstrate self-control, Ability to participate in decision making will improve, Ability to verbalize feelings will improve, Ability to disclose and discuss suicidal ideas, Ability to identify and develop effective coping behaviors will improve, and Compliance with prescribed medications will improve  Medication Management: RN will administer medications as ordered by provider, will assess and evaluate patient's response and provide education to patient for prescribed medication. RN will report any adverse and/or side effects to prescribing provider.  Therapeutic Interventions: 1 on 1 counseling sessions, Psychoeducation, Medication administration, Evaluate responses to treatment, Monitor vital signs and CBGs as ordered, Perform/monitor CIWA, COWS, AIMS and Fall Risk screenings as ordered, Perform wound care treatments as ordered.  Evaluation of Outcomes: Progressing   LCSW Treatment Plan for Primary Diagnosis: Bipolar depression (HCC) Long Term  Goal(s): Safe transition to appropriate next level of care at discharge, Engage patient in therapeutic group addressing interpersonal concerns.  Short Term Goals: Engage patient in aftercare planning with referrals and resources, Increase social support, Increase ability to appropriately verbalize feelings, Increase emotional regulation, Facilitate acceptance of mental health diagnosis and concerns, Facilitate patient progression through stages of change regarding substance use diagnoses and concerns, and Increase skills for wellness and recovery  Therapeutic Interventions: Assess for all discharge needs, 1 to 1 time with Social worker, Explore available resources and support systems, Assess for adequacy in community support network, Educate family and significant other(s) on suicide prevention, Complete Psychosocial Assessment, Interpersonal group therapy.  Evaluation of Outcomes: Progressing   Progress in Treatment: Attending groups: Yes. Participating in groups: Yes. Taking medication as prescribed: Yes. Toleration medication: Yes. Family/Significant other contact made: Yes, individual(s) contacted:  Chana Bode, ex-wife Patient understands diagnosis: Yes. Discussing patient identified problems/goals with staff: Yes. Medical problems stabilized or resolved: Yes. Denies suicidal/homicidal ideation: Yes. Issues/concerns per patient self-inventory: No. Other: None  New problem(s) identified: No, Describe:  none   New Short Term/Long Term Goal(s): Patient to work towards medication management for mood stabilization; elimination of SI thoughts; development of comprehensive mental wellness plan. Update 6/8: No update at this time.  Update 09/02/2022: No changes at this time. Update 09/07/2022:  No changes at this time. Update 09/12/22 No changes at this time Update 09/17/22 No changes at this time  Patient Goals:   No additional goals identified at this time. Patient to continue to work towards  original goals identified in initial treatment team meeting. CSW will remain available to patient should they voice additional treatment goals. Update 6/8: The patient has not identified any new goals at this time. Update 09/02/2022: No changes at this time. Update 09/07/2022:  No changes at this time. Update 09/12/22 No changes at this time Update 09/17/22 No changes at this time    Discharge Plan or Barriers: No psychosocial barriers identified at this time, patient to return to place of residence when appropriate for discharge. Update 6/8: There are no new barriers identified at this time. The patient will return home, the doctor has not provided a discharge date as of yet. Update 09/02/2022: CSW notes progress from patient. However,  patient does not acknowledge his progress.  Patient reports that he needs help at home, however, is not able  to identify what support is needed.  The things identified patient was able to acknowledge that he addresses those things on his own at home and at the hospital.  Update 09/07/2022:  Patient remains somewhat apprehensive towards discharge. Patient reports that he will return home when it is time for him to be discharged.  Update 6/23/254 CSW discussed having an OT order placed to help pt with meds in preparation for discharge. Update 09/17/22 Pt received OT visit and an order for home health which is already set up through [facility] for discharge.    Reason for Continuation of Hospitalization: Depression Medication stabilization   Estimated Length of Stay: 1-7 days Update 6/8: TBD Update 09/02/2022: TBD Update 09/07/2022:  TBD Update 09/12/22 TBD Update 09/17/22 TBD, doctor expects to discharge early next week   Last 3 [place] Suicide Severity Risk Score: Flowsheet Row Admission (Current) from 07/30/2022 in [facility] Most recent reading at 08/23/2022 12:20 PM ED from 07/30/2022 in [facility] Emergency Department at [facility] Most recent  reading at 07/30/2022  3:10 PM Office Visit from 07/26/2022 in confidential department Most recent reading at 07/27/2022 11:27 AM  C-SSRS RISK CATEGORY No Risk No Risk No Risk       Last PHQ 2/9 Scores:    07/26/2022    9:10 AM 02/24/2022    1:14 PM 01/25/2022    1:34 PM  Depression screen PHQ 2/9  Decreased Interest 1 0 0  Down, Depressed, Hopeless 1 0 0  PHQ - 2 Score 2 0 0  Altered sleeping 0 0 0  Tired, decreased energy 0 0 0  Change in appetite 0 0 0  Feeling bad or failure about yourself  1 0 0  Trouble concentrating 0 0 0  Moving slowly or fidgety/restless 0 0 0  Suicidal thoughts 0 0 0  PHQ-9 Score 3 0 0  Difficult doing work/chores  Not difficult at all     Scribe for Treatment Team: Elza Rafter, Theresia Majors 09/17/2022 4:01 PM

## 2022-09-17 NOTE — Progress Notes (Signed)
   09/17/22 0600  15 Minute Checks  Location Bedroom  Visual Appearance Calm  Behavior Sleeping  Sleep (Behavioral Health Patients Only)  Calculate sleep? (Click Yes once per 24 hr at 0600 safety check) Yes  Documented sleep last 24 hours 7.25

## 2022-09-17 NOTE — Group Note (Signed)
Date:  09/17/2022 Time:  11:15 AM  Group Topic/Focus:  Goals Group:   The focus of this group is to help patients establish daily goals to achieve during treatment and discuss how the patient can incorporate goal setting into their daily lives to aide in recovery.    Participation Level:  Active  Participation Quality:  Appropriate  Affect:  Appropriate  Cognitive:  Appropriate  Insight: Appropriate  Engagement in Group:  Engaged  Modes of Intervention:  Discussion  Additional Comments:  None  Luis Miller 09/17/2022, 11:15 AM

## 2022-09-18 DIAGNOSIS — F319 Bipolar disorder, unspecified: Secondary | ICD-10-CM | POA: Diagnosis not present

## 2022-09-18 LAB — RENAL FUNCTION PANEL
Albumin: 3.8 g/dL (ref 3.5–5.0)
Anion gap: 10 (ref 5–15)
BUN: 66 mg/dL — ABNORMAL HIGH (ref 8–23)
CO2: 26 mmol/L (ref 22–32)
Calcium: 9.1 mg/dL (ref 8.9–10.3)
Chloride: 100 mmol/L (ref 98–111)
Creatinine, Ser: 5.21 mg/dL — ABNORMAL HIGH (ref 0.61–1.24)
GFR, Estimated: 11 mL/min — ABNORMAL LOW (ref >60)
Glucose, Bld: 120 mg/dL — ABNORMAL HIGH (ref 70–99)
Phosphorus: 3.7 mg/dL (ref 2.5–4.6)
Potassium: 3.8 mmol/L (ref 3.5–5.1)
Sodium: 136 mmol/L (ref 135–145)

## 2022-09-18 LAB — CBC
HCT: 30.2 % — ABNORMAL LOW (ref 39.0–52.0)
Hemoglobin: 10.3 g/dL — ABNORMAL LOW (ref 13.0–17.0)
MCH: 35.4 pg — ABNORMAL HIGH (ref 26.0–34.0)
MCHC: 34.1 g/dL (ref 30.0–36.0)
MCV: 103.8 fL — ABNORMAL HIGH (ref 80.0–100.0)
Platelets: 148 10*3/uL — ABNORMAL LOW (ref 150–400)
RBC: 2.91 MIL/uL — ABNORMAL LOW (ref 4.22–5.81)
RDW: 15.1 % (ref 11.5–15.5)
WBC: 4.3 10*3/uL (ref 4.0–10.5)
nRBC: 0 % (ref 0.0–0.2)

## 2022-09-18 MED ORDER — EPOETIN ALFA 4000 UNIT/ML IJ SOLN
INTRAMUSCULAR | Status: AC
  Filled 2022-09-18: qty 1
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
## 2022-09-18 NOTE — Progress Notes (Signed)
Patient admitted to [facility] voluntarily for Bipolar depression.  He is calm, cooperative. And interacts well with other residents. Receives dialysis every T,R,Sat mornings with patent dialysis port to left arm.  Currently denies pain - is a little short tempered with being questioned after dialysis. Lunch tray given with morning meds holding his lunchtime adderal. Resident ambulates without a walker with slow steady gait. Denies pain, denies SI, HI or AVH. Will continue to monitor.

## 2022-09-18 NOTE — Plan of Care (Signed)
Calm and cooperative. Flat affect. Limited interaction with peers and staff. Attends group. Po med compliant. No behavior issues noted. Denies SI/HI/AVH. Q 15 min checks maintained for safety. No c/o pain/discomfort noted.   Problem: Activity: Goal: Risk for activity intolerance will decrease Outcome: Progressing   Problem: Nutrition: Goal: Adequate nutrition will be maintained Outcome: Progressing   Problem: Coping: Goal: Level of anxiety will decrease Outcome: Progressing   Problem: Elimination: Goal: Will not experience complications related to bowel motility Outcome: Progressing Goal: Will not experience complications related to urinary retention Outcome: Progressing   Problem: Pain Managment: Goal: General experience of comfort will improve Outcome: Progressing

## 2022-09-18 NOTE — Progress Notes (Signed)
  Received patient in bed to unit.   Informed consent signed and in chart.    TX duration:3.5hrs     Transported back to floor  Hand-off given to patient's nurse. No c/o and no distress noted    Access used: LAVG Access issues: high venous and arterial pressures   NP made aware    Total UF removed: 1.0L Medication(s) given: 4,000u epogen Post HD VS: 108/67 Post HD weight: 80.0kg     Lynann Beaver  [facility]

## 2022-09-18 NOTE — Progress Notes (Signed)
[facility]  Dialysis Note   Subjective:   Patient seen and evaluated during dialysis   HEMODIALYSIS FLOWSHEET:  Blood Flow Rate (mL/min): 300 mL/min Arterial Pressure (mmHg): -240 mmHg Venous Pressure (mmHg): 290 mmHg TMP (mmHg): 16 mmHg Ultrafiltration Rate (mL/min): 0 mL/min (UF due to pt cramping.) Dialysate Flow Rate (mL/min): 300 ml/min Dialysis Fluid Bolus: Normal Saline Bolus Amount (mL): 100 mL   Patient still nervous about going home. Does not know how he would manage to balance his checkbook and deal with other activities of daily living.  Objective:  Vital signs in last 24 hours:  Temp:  [97.6 F (36.4 C)-98.3 F (36.8 C)] 97.6 F (36.4 C) (06/29 0726) Pulse Rate:  [72-102] 73 (06/29 1100) Resp:  [14-23] 17 (06/29 1100) BP: (100-133)/(66-82) 127/77 (06/29 1100) SpO2:  [92 %-100 %] 100 % (06/29 1100) Weight:  [80.6 kg] 80.6 kg (06/29 0726)  Weight change:  Filed Weights   09/16/22 0742 09/16/22 1245 09/18/22 0726  Weight: 81.3 kg 81.3 kg 80.6 kg    Intake/Output: No intake/output data recorded.   Intake/Output this shift:  No intake/output data recorded.  Physical Exam: General: NAD, flat affect   Head: Normocephalic, atraumatic. Moist oral mucosal membranes  Eyes: Anicteric  Lungs:  Clear to auscultation  Heart: Regular rate and rhythm  Abdomen:  Soft, nontender  Extremities:  Trace peripheral edema.  Neurologic: Alert, moving all four extremities  Skin: No lesions  Access: Lt AVF    Basic Metabolic Panel: Recent Labs  Lab 09/14/22 0814 09/16/22 0758 09/18/22 0743  NA 137 134* 136  K 3.9 3.9 3.8  CL 101 99 100  CO2 25 24 26   GLUCOSE 110* 106* 120*  BUN 67* 65* 66*  CREATININE 5.86* 5.26* 5.21*  CALCIUM 9.1 8.9 9.1  PHOS 4.0 4.0 3.7     Liver Function Tests: Recent Labs  Lab 09/14/22 0814 09/16/22 0758 09/18/22 0743  ALBUMIN 3.8 3.9 3.8    No results for input(s): "LIPASE", "AMYLASE" in the last 168  hours. No results for input(s): "AMMONIA" in the last 168 hours.  CBC: Recent Labs  Lab 09/14/22 0814 09/16/22 0758 09/18/22 0743  WBC 3.9* 4.0 4.3  HGB 9.9* 10.3* 10.3*  HCT 29.5* 30.1* 30.2*  MCV 102.8* 102.7* 103.8*  PLT 148* 137* 148*     Cardiac Enzymes: No results for input(s): "CKTOTAL", "CKMB", "CKMBINDEX", "TROPONINI" in the last 168 hours.  BNP: Invalid input(s): "POCBNP"  CBG: No results for input(s): "GLUCAP" in the last 168 hours.  Microbiology: Results for orders placed or performed in visit on 05/12/22  Microscopic Examination     Status: Abnormal   Collection Time: 05/12/22 10:39 AM   Urine  Result Value Ref Range Status   WBC, UA 6-10 (A) 0 - 5 /hpf Final   RBC, Urine 0-2 0 - 2 /hpf Final   Epithelial Cells (non renal) 0-10 0 - 10 /hpf Final   Bacteria, UA Moderate (A) None seen/Few Final    Coagulation Studies: No results for input(s): "LABPROT", "INR" in the last 72 hours.  Urinalysis: No results for input(s): "COLORURINE", "LABSPEC", "PHURINE", "GLUCOSEU", "HGBUR", "BILIRUBINUR", "KETONESUR", "PROTEINUR", "UROBILINOGEN", "NITRITE", "LEUKOCYTESUR" in the last 72 hours.  Invalid input(s): "APPERANCEUR"    Imaging: No results found.   Medications:     allopurinol  100 mg Oral BID   amphetamine-dextroamphetamine  15 mg Oral BID WC   ARIPiprazole  20 mg Oral QPC breakfast   benazepril  20 mg Oral QPM  buPROPion  150 mg Oral Daily   cholecalciferol  1,000 Units Oral Daily   doxepin  100 mg Oral QHS   epoetin (EPOGEN/PROCRIT) injection  4,000 Units Intravenous Q T,Th,Sa-HD   feeding supplement (NEPRO CARB STEADY)  237 mL Oral Q24H   lamoTRIgine  25 mg Oral BH-q8a4p   loratadine  10 mg Oral Daily   potassium chloride  10 mEq Oral Daily   rosuvastatin  10 mg Oral Daily   senna-docusate  2 tablet Oral QPC breakfast   traZODone  100 mg Oral QHS   acetaminophen, alteplase, alum & mag hydroxide-simeth, droperidol, fentaNYL (SUBLIMAZE)  injection, fluticasone, heparin, lidocaine (PF), lidocaine-prilocaine, loperamide, ondansetron (ZOFRAN) IV, pentafluoroprop-tetrafluoroeth, polyethylene glycol, promethazine  Assessment/ Plan:  [name]. is a 77 y.o.  male white male with end stage renal disease on hemodialysis, hypertension, gout, depression, history of melanoma, crohn's, and CVA who presents to [facility] on 07/30/2022 for Bipolar depression (HCC) [F31.9]   Principal Problem:   Bipolar depression (HCC) End-stage renal disease.  **Due to extended admission, patient will need to be re-established with outpatient clinic prior to discharge. Please contact renal navigator for discharge updates**   End Stage Renal Disease on hemodialysis: Receiving dialysis today, UF goal 1L as tolerated.  -Patient seen and evaluated during hemodialysis treatment.  Tolerating treatment well today.  Next treatment on Tuesday if still here.   Potassium [facility] vascular lab)  Date Value Ref Range Status  09/28/2021 3.6 3.5 - 5.1 mmol/L Final    Comment:    Performed at [facility], [address] [place], [place] [address]   No intake or output data in the 24 hours ending 09/18/22 1124   2. Hypertension with chronic kidney disease: Maintain the patient on benazepril at this time. BP 127/77   Pulse 73   Temp 97.6 F (36.4 C) (Oral)   Resp 17   Ht 5\' 6"  (1.676 m)   Wt 80.6 kg   SpO2 100%   BMI 28.68 kg/m   3. Anemia of chronic kidney disease/ kidney injury/chronic disease/acute blood loss:   Lab Results  Component Value Date   HGB 10.3 (L) 09/18/2022  Continue Epogen 4000 units IV on TTS schedule.  4. Secondary Hyperparathyroidism:     Lab Results  Component Value Date   CALCIUM 9.1 09/18/2022   CAION 1.17 11/05/2021   PHOS 3.7 09/18/2022   Phosphorus at target at 3.7.    LOS: 50 Bernard Slayden [facility] 6/29/202411:24 AM

## 2022-09-18 NOTE — Group Note (Signed)
Date:  09/18/2022 Time:  12:39 AM  Group Topic/Focus:  Building Self Esteem:   The Focus of this group is helping patients become aware of the effects of self-esteem on their lives, the things they and others do that enhance or undermine their self-esteem, seeing the relationship between their level of self-esteem and the choices they make and learning ways to enhance self-esteem. Coping With Mental Health Crisis:   The purpose of this group is to help patients identify strategies for coping with mental health crisis.  Group discusses possible causes of crisis and ways to manage them effectively. Developing a Wellness Toolbox:   The focus of this group is to help patients develop a "wellness toolbox" with skills and strategies to promote recovery upon discharge. Goals Group:   The focus of this group is to help patients establish daily goals to achieve during treatment and discuss how the patient can incorporate goal setting into their daily lives to aide in recovery. Healthy Communication:   The focus of this group is to discuss communication, barriers to communication, as well as healthy ways to communicate with others. Identifying Needs:   The focus of this group is to help patients identify their personal needs that have been historically problematic and identify healthy behaviors to address their needs. Making Healthy Choices:   The focus of this group is to help patients identify negative/unhealthy choices they were using prior to admission and identify positive/healthier coping strategies to replace them upon discharge. Managing Feelings:   The focus of this group is to identify what feelings patients have difficulty handling and develop a plan to handle them in a healthier way upon discharge. Rediscovering Joy:   The focus of this group is to explore various ways to relieve stress in a positive manner.    Participation Level:  Active  Participation Quality:  Appropriate  Affect:   Appropriate  Cognitive:  Alert and Appropriate  Insight: Appropriate, Good, and Improving  Engagement in Group:  Developing/Improving, Engaged, Improving, and Supportive  Modes of Intervention:  Discussion and Support  Additional Comments:    [name] 09/18/2022, 12:39 AM

## 2022-09-19 ENCOUNTER — Inpatient Hospital Stay: Payer: Medicare Other

## 2022-09-19 DIAGNOSIS — F319 Bipolar disorder, unspecified: Secondary | ICD-10-CM | POA: Diagnosis not present
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
## 2022-09-19 NOTE — Group Note (Signed)
Date:  09/19/2022 Time:  5:37 PM  Group Topic/Focus:  Coping With Mental Health Crisis:   The purpose of this group is to help patients identify strategies for coping with mental health crisis.  Group discusses possible causes of crisis and ways to manage them effectively.    Participation Level:  Active  Participation Quality:  Appropriate  Affect:  Appropriate  Cognitive:  Appropriate  Insight: Appropriate  Engagement in Group:  Engaged  Modes of Intervention:  Discussion  Additional Comments:     Kaine Mcquillen L Cherril Hett 09/19/2022, 5:37 PM

## 2022-09-19 NOTE — Group Note (Signed)
LCSW Group Therapy Note   Group Date: 09/19/2022 Start Time: 1310 End Time: 1357   Type of Therapy and Topic:  Group Therapy: 7 ways to Reduce Stress  Participation Level:  Active     Summary of Patient Progress:  The patient attended group. The patient could not think of anything for the ice breaker question. The patient shared with the group something's that he has been finding stressful. The patient stated that strengthen his social network is the stress reliever that he needs to work on.    Marshell Levan, LCSWA 09/19/2022  2:11 PM

## 2022-09-19 NOTE — Progress Notes (Signed)
Patient is calm and cooperative. He denies SI/HI/AVH. He denies anxiety and depression although his mood is sad and affect is sullen. Patient attended group and appropriately interacted with staff and peers. No complaints of pain after having a fall overnight.  No PRN's given. No distress noted or voiced.  Q15 minute unit checks in place.

## 2022-09-19 NOTE — Progress Notes (Signed)
Staff heard a loud thump from the patient's room. Pt found standing near the grey chair states he had a bad dream that someone was breaking into his house says he got up and went to the bathroom then bumped into the chair and fell. C/o right hip pain. Upon assessment. Pt is alert and oriented. Able to follow commands. Up ad lib. +ROM noted. C/o right hip pain. Skin observed no redness, no swelling and tender to touch. Skin warm, dry and intact. Vital signs obtain.Rashaun, NP notified. N.O. xray now. Cicero Duck, [facility] made aware. Richard Uriegas notified via telephone call. No answer. Message left on voice machine. No apparent injuries noted at this time. No s/s of acute distress noted. No c/o lightheadedness or dizziness noted. Tylenol 650 mg po given as ordered PRN for discomfort. No further complaints offered. Plan of care continued.

## 2022-09-19 NOTE — Plan of Care (Signed)
  Problem: Nutrition: Goal: Adequate nutrition will be maintained Outcome: Progressing   Problem: Coping: Goal: Level of anxiety will decrease Outcome: Progressing   Problem: Safety: Goal: Ability to remain free from injury will improve Outcome: Not Progressing

## 2022-09-20 ENCOUNTER — Ambulatory Visit: Payer: Medicare Other | Admitting: Psychiatry

## 2022-09-20 DIAGNOSIS — F319 Bipolar disorder, unspecified: Secondary | ICD-10-CM | POA: Diagnosis not present

## 2022-09-20 DIAGNOSIS — T82590A Other mechanical complication of surgically created arteriovenous fistula, initial encounter: Secondary | ICD-10-CM | POA: Insufficient documentation

## 2022-09-20 LAB — CBC
HCT: 29.7 % — ABNORMAL LOW (ref 39.0–52.0)
Hemoglobin: 10.1 g/dL — ABNORMAL LOW (ref 13.0–17.0)
MCH: 35.6 pg — ABNORMAL HIGH (ref 26.0–34.0)
MCHC: 34 g/dL (ref 30.0–36.0)
MCV: 104.6 fL — ABNORMAL HIGH (ref 80.0–100.0)
Platelets: 128 10*3/uL — ABNORMAL LOW (ref 150–400)
RBC: 2.84 MIL/uL — ABNORMAL LOW (ref 4.22–5.81)
RDW: 15.5 % (ref 11.5–15.5)
WBC: 4.5 10*3/uL (ref 4.0–10.5)
nRBC: 0 % (ref 0.0–0.2)

## 2022-09-20 LAB — HEPATITIS B SURFACE ANTIGEN: Hepatitis B Surface Ag: NONREACTIVE

## 2022-09-20 NOTE — Group Note (Signed)
Date:  09/20/2022 Time:  3:31 AM  Group Topic/Focus:  Emotional Education:   The focus of this group is to discuss what feelings/emotions are, and how they are experienced.  The focus of this group is to learn each patients love language which leads to building healthier relationships with their support systems.   Participation Level:  Minimal  Participation Quality:  Appropriate  Affect:  Appropriate  Cognitive:  Alert  Insight: Good  Engagement in Group:  Engaged  Modes of Intervention:  Activity and Education  Additional Comments:    ENVER MELLER 09/20/2022, 3:31 AM

## 2022-09-20 NOTE — Group Note (Signed)
[facility] LCSW Group Therapy Note    Group Date: 09/20/2022 Start Time: 1300 End Time: 1350  Type of Therapy and Topic:  Group Therapy:  Overcoming Obstacles  Participation Level:  [facility] PARTICIPATION LEVEL: None  Mood:  Description of Group:   In this group patients will be encouraged to explore what they see as obstacles to their own wellness and recovery. They will be guided to discuss their thoughts, feelings, and behaviors related to these obstacles. The group will process together ways to cope with barriers, with attention given to specific choices patients can make. Each patient will be challenged to identify changes they are motivated to make in order to overcome their obstacles. This group will be process-oriented, with patients participating in exploration of their own experiences as well as giving and receiving support and challenge from other group members.  Therapeutic Goals: 1. Patient will identify personal and current obstacles as they relate to admission. 2. Patient will identify barriers that currently interfere with their wellness or overcoming obstacles.  3. Patient will identify feelings, thought process and behaviors related to these barriers. 4. Patient will identify two changes they are willing to make to overcome these obstacles:    Summary of Patient Progress Patient was present in group, however, did not engage in group discussion.    Therapeutic Modalities:   Cognitive Behavioral Therapy Solution Focused Therapy Motivational Interviewing Relapse Prevention Therapy   Harden Mo, LCSW

## 2022-09-20 NOTE — Group Note (Signed)
Recreation Therapy Group Note   Group Topic:General Recreation  Group Date: 09/20/2022 Start Time: 1410 End Time: 1450 Facilitators: Rosina Lowenstein, LRT, CTRS Location: Courtyard  Group Description: Outdoor Recreation. Patients had the option to play ring toss, play with a deck of cards or sit and listen to music while outside in the courtyard getting fresh air and sunlight. LRT and pts discussed things that they enjoy doing in their free time outside of the hospital.  Goal Area(s) Addressed: Patient will identify leisure interests.  Patient will practice healthy decision making. Patient will engage in recreation activity.   Affect/Mood: Appropriate and Flat   Participation Level: Active   Participation Quality: Independent   Behavior: Calm and Cooperative   Speech/Thought Process: Coherent   Insight: Good   Judgement: Good   Modes of Intervention: Activity   Patient Response to Interventions:  Attentive, Engaged, Interested , and Receptive   Education Outcome:  Acknowledges education   Clinical Observations/Individualized Feedback: [name] was active in their participation of session activities and group discussion. Pt played ring toss with MHT and peers. Pt interacted well with LRT and peers duration of session.   Plan: Continue to engage patient in RT group sessions 2-3x/week.   Rosina Lowenstein, LRT, CTRS 09/20/2022 3:31 PM

## 2022-09-20 NOTE — Progress Notes (Signed)
   09/20/22 2000  Psych Admission Type (Psych Patients Only)  Admission Status Voluntary  Psychosocial Assessment  Patient Complaints None  Eye Contact Fair  Facial Expression Flat  Affect Sullen  Speech Soft  Interaction Assertive  Motor Activity Slow  Appearance/Hygiene In scrubs  Behavior Characteristics Cooperative  Mood Sad  Thought Process  Coherency WDL  Content WDL  Delusions None reported or observed  Perception WDL  Hallucination None reported or observed  Judgment WDL  Confusion None  Danger to Self  Current suicidal ideation? Denies  Agreement Not to Harm Self Yes  Description of Agreement verbal  Danger to Others  Danger to Others None reported or observed
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
## 2022-09-20 NOTE — Plan of Care (Signed)
Calm and cooperative. Flat affect. Attends group. Interacts with peers. Po med compliant. No behavior issues noted. Support and encouragement provided. Denies SI/HI/AVH. Q 15 min checks maintained for safety. No c/o pain/discomfort noted.   Problem: Activity: Goal: Risk for activity intolerance will decrease Outcome: Progressing   Problem: Nutrition: Goal: Adequate nutrition will be maintained Outcome: Progressing   Problem: Coping: Goal: Level of anxiety will decrease Outcome: Progressing   Problem: Elimination: Goal: Will not experience complications related to bowel motility Outcome: Progressing Goal: Will not experience complications related to urinary retention Outcome: Progressing   Problem: Pain Managment: Goal: General experience of comfort will improve Outcome: Progressing   Problem: Safety: Goal: Ability to remain free from injury will improve Outcome: Progressing
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
## 2022-09-20 NOTE — Plan of Care (Signed)

## 2022-09-21 ENCOUNTER — Encounter: Payer: Self-pay | Admitting: Psychiatry

## 2022-09-21 ENCOUNTER — Encounter
Admission: AD | Disposition: A | Discharge status: Other Acute Inpatient Hospital | Payer: Self-pay | Source: Intra-hospital | Attending: Psychiatry

## 2022-09-21 DIAGNOSIS — F319 Bipolar disorder, unspecified: Secondary | ICD-10-CM | POA: Diagnosis not present

## 2022-09-21 DIAGNOSIS — Z992 Dependence on renal dialysis: Secondary | ICD-10-CM

## 2022-09-21 DIAGNOSIS — N186 End stage renal disease: Secondary | ICD-10-CM

## 2022-09-21 DIAGNOSIS — T82858A Stenosis of vascular prosthetic devices, implants and grafts, initial encounter: Secondary | ICD-10-CM

## 2022-09-21 HISTORY — PX: A/V FISTULAGRAM: CATH118298

## 2022-09-21 LAB — CBC
HCT: 29.2 % — ABNORMAL LOW (ref 39.0–52.0)
Hemoglobin: 10 g/dL — ABNORMAL LOW (ref 13.0–17.0)
MCH: 35.6 pg — ABNORMAL HIGH (ref 26.0–34.0)
MCHC: 34.2 g/dL (ref 30.0–36.0)
MCV: 103.9 fL — ABNORMAL HIGH (ref 80.0–100.0)
Platelets: 134 10*3/uL — ABNORMAL LOW (ref 150–400)
RBC: 2.81 MIL/uL — ABNORMAL LOW (ref 4.22–5.81)
RDW: 15.3 % (ref 11.5–15.5)
WBC: 3.5 10*3/uL — ABNORMAL LOW (ref 4.0–10.5)
nRBC: 0 % (ref 0.0–0.2)

## 2022-09-21 LAB — RENAL FUNCTION PANEL
Albumin: 3.9 g/dL (ref 3.5–5.0)
Anion gap: 12 (ref 5–15)
BUN: 80 mg/dL — ABNORMAL HIGH (ref 8–23)
CO2: 24 mmol/L (ref 22–32)
Calcium: 8.9 mg/dL (ref 8.9–10.3)
Chloride: 102 mmol/L (ref 98–111)
Creatinine, Ser: 6.24 mg/dL — ABNORMAL HIGH (ref 0.61–1.24)
GFR, Estimated: 9 mL/min — ABNORMAL LOW (ref >60)
Glucose, Bld: 171 mg/dL — ABNORMAL HIGH (ref 70–99)
Phosphorus: 4.3 mg/dL (ref 2.5–4.6)
Potassium: 4.4 mmol/L (ref 3.5–5.1)
Sodium: 138 mmol/L (ref 135–145)

## 2022-09-21 LAB — HEPATITIS B CORE ANTIBODY, TOTAL: Hep B Core Total Ab: NONREACTIVE

## 2022-09-21 SURGERY — A/V FISTULAGRAM
Anesthesia: Moderate Sedation | Laterality: Left

## 2022-09-21 MED ORDER — CEFAZOLIN SODIUM-DEXTROSE 1-4 GM/50ML-% IV SOLN
INTRAVENOUS | Status: DC | PRN
  Administered 2022-09-21: 1 g via INTRAVENOUS

## 2022-09-21 MED ORDER — METHYLPREDNISOLONE SODIUM SUCC 125 MG IJ SOLR: 125.0000 mg | Freq: Once | INTRAMUSCULAR | Status: DC | PRN

## 2022-09-21 MED ORDER — FENTANYL CITRATE PF 50 MCG/ML IJ SOSY
PREFILLED_SYRINGE | INTRAMUSCULAR | Status: AC
  Filled 2022-09-21: qty 1

## 2022-09-21 MED ORDER — FENTANYL CITRATE PF 50 MCG/ML IJ SOSY: 12.5000 ug | PREFILLED_SYRINGE | Freq: Once | INTRAMUSCULAR | Status: DC | PRN

## 2022-09-21 MED ORDER — SODIUM CHLORIDE 0.9 % IV SOLN: INTRAVENOUS | Status: DC

## 2022-09-21 MED ORDER — IODIXANOL 320 MG/ML IV SOLN
INTRAVENOUS | Status: DC | PRN
  Administered 2022-09-21: 25 mL

## 2022-09-21 MED ORDER — HYDROMORPHONE HCL 1 MG/ML IJ SOLN: 1.0000 mg | Freq: Once | INTRAMUSCULAR | Status: DC | PRN

## 2022-09-21 MED ORDER — MIDAZOLAM HCL 2 MG/2ML IJ SOLN
INTRAMUSCULAR | Status: AC
  Filled 2022-09-21: qty 2

## 2022-09-21 MED ORDER — CEFAZOLIN SODIUM-DEXTROSE 1-4 GM/50ML-% IV SOLN: 1.0000 g | INTRAVENOUS | Status: DC

## 2022-09-21 MED ORDER — FENTANYL CITRATE (PF) 100 MCG/2ML IJ SOLN
INTRAMUSCULAR | Status: DC | PRN
  Administered 2022-09-21: 50 ug via INTRAVENOUS

## 2022-09-21 MED ORDER — PENTAFLUOROPROP-TETRAFLUOROETH EX AERO
INHALATION_SPRAY | CUTANEOUS | Status: AC
  Filled 2022-09-21: qty 30

## 2022-09-21 MED ORDER — HEPARIN (PORCINE) IN NACL 1000-0.9 UT/500ML-% IV SOLN
INTRAVENOUS | Status: DC | PRN
  Administered 2022-09-21: 1000 mL

## 2022-09-21 MED ORDER — EPOETIN ALFA 4000 UNIT/ML IJ SOLN
INTRAMUSCULAR | Status: AC
  Filled 2022-09-21: qty 1

## 2022-09-21 MED ORDER — ONDANSETRON HCL 4 MG/2ML IJ SOLN: 4.0000 mg | Freq: Four times a day (QID) | INTRAMUSCULAR | Status: DC | PRN

## 2022-09-21 MED ORDER — MIDAZOLAM HCL 2 MG/ML PO SYRP: 8.0000 mg | ORAL_SOLUTION | Freq: Once | ORAL | Status: DC | PRN

## 2022-09-21 MED ORDER — HEPARIN SODIUM (PORCINE) 1000 UNIT/ML IJ SOLN
INTRAMUSCULAR | Status: AC
  Filled 2022-09-21: qty 10

## 2022-09-21 MED ORDER — LIDOCAINE HCL (PF) 1 % IJ SOLN
INTRAMUSCULAR | Status: DC | PRN
  Administered 2022-09-21: 10 mL

## 2022-09-21 MED ORDER — MIDAZOLAM HCL 2 MG/2ML IJ SOLN
INTRAMUSCULAR | Status: DC | PRN
  Administered 2022-09-21: 2 mg via INTRAVENOUS

## 2022-09-21 MED ORDER — FAMOTIDINE 20 MG PO TABS: 40.0000 mg | ORAL_TABLET | Freq: Once | ORAL | Status: DC | PRN

## 2022-09-21 MED ORDER — DIPHENHYDRAMINE HCL 50 MG/ML IJ SOLN: 50.0000 mg | Freq: Once | INTRAMUSCULAR | Status: DC | PRN

## 2022-09-21 MED ORDER — HEPARIN SODIUM (PORCINE) 1000 UNIT/ML IJ SOLN
INTRAMUSCULAR | Status: DC | PRN
  Administered 2022-09-21: 4000 [IU] via INTRAVENOUS

## 2022-09-21 SURGICAL SUPPLY — 18 items
BALLN DORADO 8X60X80 (BALLOONS) ×1
BALLN ULTRVRSE 4X60X75 (BALLOONS) ×1
BALLOON DORADO 8X60X80 (BALLOONS) IMPLANT
BALLOON ULTRVRSE 4X60X75 (BALLOONS) IMPLANT
COVER PROBE ULTRASOUND 5X96 (MISCELLANEOUS) IMPLANT
DRAPE BRACHIAL (DRAPES) IMPLANT
GOWN STRL REUS W/ TWL LRG LVL3 (GOWN DISPOSABLE) ×1 IMPLANT
GOWN STRL REUS W/TWL LRG LVL3 (GOWN DISPOSABLE) ×1
KIT ENCORE 26 ADVANTAGE (KITS) IMPLANT
NDL ENTRY 21GA 7CM ECHOTIP (NEEDLE) IMPLANT
NEEDLE ENTRY 21GA 7CM ECHOTIP (NEEDLE) ×1 IMPLANT
PACK ANGIOGRAPHY (CUSTOM PROCEDURE TRAY) ×1 IMPLANT
SET INTRO CAPELLA COAXIAL (SET/KITS/TRAYS/PACK) IMPLANT
SHEATH BRITE TIP 6FRX5.5 (SHEATH) IMPLANT
SHEATH BRITE TIP 7FRX5.5 (SHEATH) IMPLANT
STENT COVERA FLARED 8X60X80 (Permanent Stent) IMPLANT
SUT MNCRL AB 4-0 PS2 18 (SUTURE) IMPLANT
WIRE SUPRACORE 190CM (WIRE) IMPLANT

## 2022-09-21 NOTE — Progress Notes (Signed)
Hemodialysis note  Received patient in bed to unit. Alert and oriented.  Informed consent signed and in chart.  Treatment initiated: 1420 Treatment completed: 1724  Patient tolerated well. Transported back to room, alert without acute distress.  Report given to patient's RN.   Access used: LUA AVG Access issues: none  Total UF removed: 500 ml Medication(s) given:  Epogen 4000 units IV  Post HD weight: 80.2 kg   [facility]

## 2022-09-21 NOTE — Progress Notes (Signed)
   09/21/22 0601  15 Minute Checks  Location Bedroom  Visual Appearance Calm  Behavior Composed  Sleep (Behavioral Health Patients Only)  Calculate sleep? (Click Yes once per 24 hr at 0600 safety check) Yes  Documented sleep last 24 hours 7.25

## 2022-09-21 NOTE — Interval H&P Note (Signed)
History and Physical Interval Note:  09/21/2022 11:53 AM  Luis Miller.  has presented today for surgery, with the diagnosis of High Venous Dialysis Graft pressures.  The various methods of treatment have been discussed with the patient and family. After consideration of risks, benefits and other options for treatment, the patient has consented to  Procedure(s): A/V Fistulagram (Left) as a surgical intervention.  The patient's history has been reviewed, patient examined, no change in status, stable for surgery.  I have reviewed the patient's chart and labs.  Questions were answered to the patient's satisfaction.     Levora Dredge

## 2022-09-21 NOTE — Progress Notes (Signed)
   09/21/22 2000  Psych Admission Type (Psych Patients Only)  Admission Status Voluntary  Psychosocial Assessment  Patient Complaints Worrying;Other (Comment) ("i am scared to go home")  Eye Contact Fair  Facial Expression Flat  Affect Sullen  Speech Soft  Interaction Assertive  Motor Activity Slow  Appearance/Hygiene In scrubs  Behavior Characteristics Cooperative  Mood Sad;Depressed  Thought Process  Coherency WDL  Content WDL  Delusions None reported or observed  Perception WDL  Hallucination None reported or observed  Judgment WDL  Confusion None  Danger to Self  Current suicidal ideation? Denies  Agreement Not to Harm Self Yes  Description of Agreement verbal  Danger to Others  Danger to Others None reported or observed

## 2022-09-21 NOTE — Group Note (Signed)
Recreation Therapy Group Note   Group Topic:Coping Skills  Group Date: 09/21/2022 Start Time: 1400 End Time: 1440 Facilitators: Rosina Lowenstein, LRT, CTRS Location: Courtyard  Group Description: Coping Skills Hot Potato. LRT and patients discussed the definition of what a coping skill is. LRT and pts discussed the importance of using coping skills and different times when they can be used. LRT and pts sat in a circle and tossed around a small ball, in random order, while listening to music. When the music stopped, whoever had the ball in their hands were encouraged to name a coping skill. LRT added in a second ball to make things more challenging.   Goal Area(s) Addressed: Patients will be able to define "coping skills". Patient will identify new coping skills.  Patient will increase communication. Patient will gain team building skills.  Affect/Mood: N/A   Participation Level: Did not attend    Clinical Observations/Individualized Feedback: [name] did not attend group.  Plan: Continue to engage patient in RT group sessions 2-3x/week.   Rosina Lowenstein, LRT, CTRS 09/21/2022 2:56 PM

## 2022-09-21 NOTE — Progress Notes (Signed)
Patient taken off unit by transport to vascular surgery.

## 2022-09-21 NOTE — Progress Notes (Signed)
Patient continues to present as sad and sullen. He denies SI/HI/AVH. He is alert and oriented.   Patient spent the entire morning in vascular surgery. He returned to the unit for a brief time and was then transferred to dialysis. Patient finally returned to the unit at approx 1800. Medications adm. Dinner consumed.  Patient expected to discharge tomorrow.
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
## 2022-09-21 NOTE — Op Note (Signed)
OPERATIVE NOTE   PROCEDURE: Contrast injection left brachial axillary AV access Percutaneous transluminal angioplasty and stent placement venous outflow left brachial axillary AV graft peripheral segment  PRE-OPERATIVE DIAGNOSIS: Complication of dialysis access                                                       End Stage Renal Disease  POST-OPERATIVE DIAGNOSIS: same as above   SURGEON: Renford Dills, M.D.  ANESTHESIA: Conscious sedation was administered under my direct supervision by the interventional radiology RN. IV Versed plus fentanyl were utilized. Continuous ECG, pulse oximetry and blood pressure was monitored throughout the entire procedure.  Conscious sedation was for a total of 26.  ESTIMATED BLOOD LOSS: minimal  FINDING(S): String sign in the venous outflow  SPECIMEN(S):  None  CONTRAST: 25 cc  FLUOROSCOPY TIME: 1.7 minutes  INDICATIONS: Luis Knabe. is a 77 y.o. male who  presents with malfunctioning left arm left arm AV access.  The patient is scheduled for angiography with possible intervention of the AV access.  The patient is aware the risks include but are not limited to: bleeding, infection, thrombosis of the cannulated access, and possible anaphylactic reaction to the contrast.  The patient acknowledges if the access can not be salvaged a tunneled catheter will be needed and will be placed during this procedure.  The patient is aware of the risks of the procedure and elects to proceed with the angiogram and intervention.  DESCRIPTION: After full informed written consent was obtained, the patient was brought back to the Special Procedure suite and placed supine position.  Appropriate cardiopulmonary monitors were placed.  The left arm was prepped and draped in the standard fashion.  Appropriate timeout is called. The left brachial axillary AV graft was cannulated with a micropuncture needle.  Cannulation was performed with ultrasound guidance. Ultrasound  was placed in a sterile sleeve, the AV access was interrogated and noted to be echolucent and compressible indicating patency. Image was recorded for the permanent record. The puncture is performed under continuous ultrasound visualization.   The microwire was advanced and the needle was exchanged for  a microsheath.  The J-wire was then advanced and a 6 Fr sheath inserted.  Hand injections were completed to image the access from the arterial anastomosis through the entire access.  The central venous structures were also imaged by hand injections.  Based on the images, the venous outflow just proximal to the end of the graft demonstrates a string sign which extends over 30 mm in length.  The more proximal axillary vein is widely patent as is the subclavian and Ondemet vein and the superior vena cava.  Reflux of contrast demonstrates the arterial portion of the graft using good condition the anastomosis with the brachial artery is widely patent and the visualized portions of the brachial artery are widely patent.    4000 units of heparin was given and a wire was negotiated through the strictures within the venous portion of the graft.  A 4 mm x 60 mm Ultraverse balloon was advanced through the lesion and inflated to 10 atm for approximately 1 minute.  The 8 mm x 60 mm Covera was deployed across the stenoses and postdilated with an 8 mm by 60 mm Dorado balloon inflated to 16 atm for approximately 1 minute.  Follow-up imaging demonstrates complete resolution of the stricture, less than 5% residual stenosis, with rapid flow of contrast through the graft, the central venous anatomy is preserved.  A 4-0 Monocryl purse-string suture was sewn around the sheath.  The sheath was removed and light pressure was applied.  A sterile bandage was applied to the puncture site.    COMPLICATIONS: None  CONDITION: Luis Miller, M.D [facility] Office: [phone]  09/21/2022 12:27 PM

## 2022-09-21 NOTE — Progress Notes (Signed)
[facility]  Dialysis Note   Subjective:   Patient seen sitting in chair Preparing for scheduled dialysis States appetite remains poor  No lower extremity edema  Objective:  Vital signs in last 24 hours:  Temp:  [98.1 F (36.7 C)-99 F (37.2 C)] 98.1 F (36.7 C) (07/02 0811) Pulse Rate:  [0-93] 74 (07/02 1227) Resp:  [8-21] 14 (07/02 1245) BP: (113-176)/(71-100) 125/82 (07/02 1245) SpO2:  [94 %-100 %] 99 % (07/02 1227)  Weight change:  Filed Weights   09/18/22 0726 09/18/22 1210 09/20/22 1100  Weight: 80.6 kg 80 kg 81.4 kg    Intake/Output: No intake/output data recorded.   Intake/Output this shift:  No intake/output data recorded.  Physical Exam: General: NAD, flat affect  Head: Normocephalic, atraumatic. Moist oral mucosal membranes  Eyes: Anicteric  Lungs:  Clear to auscultation  Heart: Regular rate and rhythm  Abdomen:  Soft, nontender  Extremities: No peripheral edema.  Neurologic: Alert, moving all four extremities  Skin: No lesions  Access: Lt AVF    Basic Metabolic Panel: Recent Labs  Lab 09/16/22 0758 09/18/22 0743  NA 134* 136  K 3.9 3.8  CL 99 100  CO2 24 26  GLUCOSE 106* 120*  BUN 65* 66*  CREATININE 5.26* 5.21*  CALCIUM 8.9 9.1  PHOS 4.0 3.7     Liver Function Tests: Recent Labs  Lab 09/16/22 0758 09/18/22 0743  ALBUMIN 3.9 3.8    No results for input(s): "LIPASE", "AMYLASE" in the last 168 hours. No results for input(s): "AMMONIA" in the last 168 hours.  CBC: Recent Labs  Lab 09/16/22 0758 09/18/22 0743 09/20/22 0922  WBC 4.0 4.3 4.5  HGB 10.3* 10.3* 10.1*  HCT 30.1* 30.2* 29.7*  MCV 102.7* 103.8* 104.6*  PLT 137* 148* 128*     Cardiac Enzymes: No results for input(s): "CKTOTAL", "CKMB", "CKMBINDEX", "TROPONINI" in the last 168 hours.  BNP: Invalid input(s): "POCBNP"  CBG: No results for input(s): "GLUCAP" in the last 168 hours.  Microbiology: Results for orders placed or performed in visit  on 05/12/22  Microscopic Examination     Status: Abnormal   Collection Time: 05/12/22 10:39 AM   Urine  Result Value Ref Range Status   WBC, UA 6-10 (A) 0 - 5 /hpf Final   RBC, Urine 0-2 0 - 2 /hpf Final   Epithelial Cells (non renal) 0-10 0 - 10 /hpf Final   Bacteria, UA Moderate (A) None seen/Few Final    Coagulation Studies: No results for input(s): "LABPROT", "INR" in the last 72 hours.  Urinalysis: No results for input(s): "COLORURINE", "LABSPEC", "PHURINE", "GLUCOSEU", "HGBUR", "BILIRUBINUR", "KETONESUR", "PROTEINUR", "UROBILINOGEN", "NITRITE", "LEUKOCYTESUR" in the last 72 hours.  Invalid input(s): "APPERANCEUR"    Imaging: PERIPHERAL VASCULAR CATHETERIZATION  Result Date: 09/21/2022 See surgical note for result.    Medications:     allopurinol  100 mg Oral BID   ARIPiprazole  20 mg Oral QPC breakfast   benazepril  20 mg Oral QPM   buPROPion  150 mg Oral Daily   cholecalciferol  1,000 Units Oral Daily   doxepin  100 mg Oral QHS   epoetin (EPOGEN/PROCRIT) injection  4,000 Units Intravenous Q T,Th,Sa-HD   feeding supplement (NEPRO CARB STEADY)  237 mL Oral Q24H   lamoTRIgine  25 mg Oral [id number]   loratadine  10 mg Oral Daily   potassium chloride  10 mEq Oral Daily   rosuvastatin  10 mg Oral Daily   senna-docusate  2 tablet Oral QPC breakfast  traZODone  100 mg Oral QHS   acetaminophen, alteplase, alum & mag hydroxide-simeth, fluticasone, heparin, lidocaine (PF), lidocaine (PF), lidocaine-prilocaine, loperamide, pentafluoroprop-tetrafluoroeth, polyethylene glycol  Assessment/ Plan:  [name]. is a 77 y.o.  male white male with end stage renal disease on hemodialysis, hypertension, gout, depression, history of melanoma, crohn's, and CVA who presents to [facility] on 07/30/2022 for Bipolar depression (HCC) [F31.9]   Principal Problem:   Bipolar depression (HCC) End-stage renal disease.  **Due to extended admission, patient will need to be re-established  with outpatient clinic prior to discharge. Please contact renal navigator for discharge updates**   End Stage Renal Disease on hemodialysis:  Receiving dialysis today, reduced UF 0.31ml due to poor oral intake.  Next treatment scheduled for Wednesday due to inpatient holiday scheduling.    Potassium [facility] vascular lab)  Date Value Ref Range Status  09/28/2021 3.6 3.5 - 5.1 mmol/L Final    Comment:    Performed at [facility], [address] [place], [place] [address]   No intake or output data in the 24 hours ending 09/21/22 1359   2. Hypertension with chronic kidney disease: Maintain the patient on benazepril at this time. BP 125/82   Pulse 74   Temp 98.1 F (36.7 C)   Resp 14   Ht 5\' 6"  (1.676 m)   Wt 81.4 kg   SpO2 99%   BMI 28.97 kg/m   3. Anemia of chronic kidney disease/ kidney injury/chronic disease/acute blood loss:   Lab Results  Component Value Date   HGB 10.1 (L) 09/20/2022  Hgb acceptable. Continue Epogen 4000 units IV with dialysis.   4. Secondary Hyperparathyroidism:     Lab Results  Component Value Date   CALCIUM 9.1 09/18/2022   CAION 1.17 11/05/2021   PHOS 3.7 09/18/2022   Bone minerals within desired range.     LOS: [address] [facility] 7/2/20241:59 PM

## 2022-09-21 NOTE — Progress Notes (Deleted)
   09/21/22 2000  Psych Admission Type (Psych Patients Only)  Admission Status Voluntary  Psychosocial Assessment  Patient Complaints None  Eye Contact Fair  Facial Expression Flat  Affect Sullen  Speech Soft  Interaction Assertive  Motor Activity Slow  Appearance/Hygiene In scrubs  Behavior Characteristics Cooperative  Mood Sad  Thought Process  Coherency WDL  Content WDL  Delusions None reported or observed  Perception WDL  Hallucination None reported or observed  Judgment WDL  Confusion None  Danger to Self  Current suicidal ideation? Denies  Agreement Not to Harm Self Yes  Description of Agreement verbal  Danger to Others  Danger to Others None reported or observed

## 2022-09-21 NOTE — Progress Notes (Signed)
PT continues to wait for procedure. VSS, call light in reach, NAD. Respirations even/unlabored. Curtain open for visibility

## 2022-09-21 NOTE — Progress Notes (Signed)
PT brought back to [facility] unit. Report given via telephone to Amy RN and also briefly at nurses station downstairs. Amy made aware of pt's increased fall risk and pt also made aware. PT agrees to call for help if he feels woozy and to take his time moving around. Procedural site shown to Amy and pt brought back to hsi room

## 2022-09-21 NOTE — Group Note (Signed)
Date:  09/21/2022 Time:  8:30 PM  Group Topic/Focus:  Self Care:   The focus of this group is to help patients understand the importance of self-care in order to improve or restore emotional, physical, spiritual, interpersonal, and financial health.    Participation Level:  Did Not Attend  Participation Quality:   Did Not Attend  Affect:   Did Not Attend  Cognitive:   Did Not Attend  Insight: None  Engagement in Group:  None  Modes of Intervention:   Did Not Attend  Additional Comments:    Garry Heater 09/21/2022, 8:30 PM

## 2022-09-22 ENCOUNTER — Encounter: Payer: Self-pay | Admitting: Vascular Surgery

## 2022-09-22 DIAGNOSIS — F319 Bipolar disorder, unspecified: Secondary | ICD-10-CM | POA: Diagnosis not present

## 2022-09-22 NOTE — Progress Notes (Signed)
Patient denies SI/HI/AVH. Affect sullen. Mood sad. Appetite good. Denies pain.  Patient was supposed to discharge today but he and the family do not feel comfortable with him leaving today. Potential discharge tomorrow July 4. See Dr. Jasmine Awe note on 07/03.  Q15 minute unit checks in place.

## 2022-09-22 NOTE — BHH Counselor (Signed)
CSW spoke with the patients son.  Son reports that patient does not have  finicial strain as reported by patient.   He also reports that the patient has a support system.  He reports that patient did well on Lithium in the past and was curious of patient can be put on Lithium again.    He expressed concern for patients discharge per the patient's report that he does not feel ready.  He requested a call from the physician.  CSW sent message via secure chat for physician to call and letting him know about the Lithium.  Call terminated without incident.  [name], MSW, LCSW 09/22/2022 11:50 AM

## 2022-09-22 NOTE — Group Note (Signed)
Date:  09/22/2022 Time:  10:31 AM  Group Topic/Focus:  Goals Group:   The focus of this group is to help patients establish daily goals to achieve during treatment and discuss how the patient can incorporate goal setting into their daily lives to aide in recovery.    Participation Level:  Did Not Attend    [name] 09/22/2022, 10:31 AM

## 2022-09-22 NOTE — Progress Notes (Signed)
Progress Note    09/22/2022 3:35 PM 1 Day Post-Op  Subjective:  Luis Miller. 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 Procedure was successful and the patient has less than 5% residual stenosis from the venous outflow tract.   On exam this morning the patient was sitting watching television. He states his left upper arm remains sore. No other complaints overnight.    Vitals:   09/21/22 2011 09/22/22 0710  BP: (!) 152/87 118/62  Pulse: 82 88  Resp:  15  Temp: 97.6 F (36.4 C) 98.2 F (36.8 C)  SpO2: 100% 100%   Physical Exam: Cardiac:  RRR, Normal S1,S2 Lungs:  Clear throughout on auscultation  Incisions:  Left upper arm with dressing clean dry and intact.  Extremities:  Palpable pulses throughout. Strong bruit and thrill in graft site.  Abdomen:  positive bowel sounds, soft, non tender and non distended.  Neurologic: AAOX 3 and follows all commands. Normal flat affect.   CBC    Component Value Date/Time   WBC 3.5 (L) 09/21/2022 1538   RBC 2.81 (L) 09/21/2022 1538   HGB 10.0 (L) 09/21/2022 1538   HGB 12.4 (L) 11/30/2021 0920   HCT 29.2 (L) 09/21/2022 1538   HCT 36.6 (L) 11/30/2021 0920   PLT 134 (L) 09/21/2022 1538   PLT 119 (L) 11/30/2021 0920   MCV 103.9 (H) 09/21/2022 1538   MCV 101 (H) 11/30/2021 0920   MCV 99 03/08/2014 1034   MCH 35.6 (H) 09/21/2022 1538   MCHC 34.2 09/21/2022 1538   RDW 15.3 09/21/2022 1538   RDW 14.0 11/30/2021 0920   RDW 12.9 03/08/2014 1034   LYMPHSABS 0.8 11/30/2021 0920   LYMPHSABS 0.6 (L) 03/08/2014 1034   MONOABS 0.5 08/10/2021 1058   MONOABS 0.4 03/08/2014 1034   EOSABS 0.3 11/30/2021 0920   EOSABS 0.3 03/08/2014 1034   BASOSABS 0.0 11/30/2021 0920   BASOSABS 0.0 03/08/2014 1034    BMET    Component Value Date/Time   NA 138 09/21/2022 1419   NA 145 (H) 02/24/2022 1325   K 4.4  09/21/2022 1419   K 4.1 02/11/2014 1443   CL 102 09/21/2022 1419   CO2 24 09/21/2022 1419   GLUCOSE 171 (H) 09/21/2022 1419   BUN 80 (H) 09/21/2022 1419   BUN 36 (H) 02/24/2022 1325   CREATININE 6.24 (H) 09/21/2022 1419   CREATININE 2.22 (H) 03/08/2014 1034   CALCIUM 8.9 09/21/2022 1419   CALCIUM 9.4 03/08/2014 1034   GFRNONAA 9 (L) 09/21/2022 1419   GFRNONAA 31 (L) 03/08/2014 1034   GFRNONAA 34 (L) 03/07/2013 0954   GFRAA 18 (L) 08/06/2019 0552   GFRAA 38 (L) 03/08/2014 1034   GFRAA 39 (L) 03/07/2013 0954    INR    Component Value Date/Time   INR 1.0 07/04/2020 1031   INR 0.9 02/22/2012 1254     Intake/Output Summary (Last 24 hours) at 09/22/2022 1535 Last data filed at 09/21/2022 1815 Gross per 24 hour  Intake 300 ml  Output 1000 ml  Net -700 ml     Assessment/Plan:  77 y.o. male is s/p Percutaneous transluminal angioplasty and stent placement venous outflow left brachial axillary AV graft peripheral segment for a malfunctioning graft. 1 Day Post-Op   PLAN: Okay to discharge home per vascular surgery. Follow up in clinic in 3 months or sooner if there are any difficulties  with dialysis access.   DVT prophylaxis:  NONE.Patient ambulatory around the unit.    Marcie Bal [facility] 09/22/2022 3:35 PM

## 2022-09-22 NOTE — Progress Notes (Signed)
   09/22/22 0549  15 Minute Checks  Location Bathroom/Shower  Visual Appearance Calm  Behavior Composed  Sleep (Behavioral Health Patients Only)  Calculate sleep? (Click Yes once per 24 hr at 0600 safety check) Yes  Documented sleep last 24 hours 6.75

## 2022-09-22 NOTE — Group Note (Signed)
Date:  09/22/2022 Time:  9:04 PM  Group Topic/Focus:  Coping With Mental Health Crisis:   The purpose of this group is to help patients identify strategies for coping with mental health crisis.  Group discusses possible causes of crisis and ways to manage them effectively.    Participation Level:  Active  Participation Quality:  Appropriate  Affect:  Appropriate  Cognitive:  Appropriate  Insight: Appropriate  Engagement in Group:  Engaged  Modes of Intervention:  Education  Additional Comments:    Garry Heater 09/22/2022, 9:04 PM

## 2022-09-22 NOTE — BHH Counselor (Signed)
CSW was informed that [facility] will follow up with Home Health 09/27/2022.  CSW has made physician aware.  [name], MSW, LCSW 09/22/2022 11:57 AM

## 2022-09-22 NOTE — Progress Notes (Signed)
  [facility] Adult Case Management Discharge Plan :  Will you be returning to the same living situation after discharge:  Yes,  pt reports that he is returning home. At discharge, do you have transportation home?: Yes,  pt reports that he will have family provide transportation. Do you have the ability to pay for your medications: Yes,  MEDICARE / MEDICARE PART A AND B  Release of information consent forms completed and in the chart;  Patient's signature needed at discharge.  Patient to Follow up at:  Follow-up Information     [facility] Follow up.   Specialty: Behavioral Health Why: Appointment is scheduled for 10/18/2022 at 10AM.  Please note the address change.  New location is beside the [employer]. Contact information: [address] [place] [address] [phone]        Gilda Crease, Latina Craver, MD Follow up in 4 week(s).   Specialties: Vascular Surgery, Cardiology, Radiology, Vascular Surgery Why: follow up after procedure with an [facility] Contact information: [address] [place] [address] [phone]         [facility] Follow up.   Why: Your home health team is scheduled for 09/23/2022 at 11AM Contact information: [address] [facility] [address] [phone]                 Next level of care provider has access to [facility] Link:no  Safety Planning and Suicide Prevention discussed: Yes,  SPE completed with the patient.     Has patient been referred to the Quitline?: Patient does not use tobacco/nicotine products  Patient has been referred for addiction treatment: No known substance use disorder.  Harden Mo, LCSW 09/22/2022, 11:44 AM

## 2022-09-22 NOTE — Group Note (Signed)
Recreation Therapy Group Note   Group Topic:Health and Wellness  Group Date: 09/22/2022 Start Time: 1400 End Time: 1440 Facilitators: Rosina Lowenstein, LRT, CTRS Location:  Dayroom  Group Description: Seated Exercise/ Tai Chi. LRT discussed the mental and physical benefits of exercise. LRT and group discussed how physical activity can be used as a coping skill. Pt's and LRT followed along to an exercise video on the TV screen that provided a visual representation and audio description of every exercise performed. Pt's encouraged to listen to their bodies and stop at any time if they experience feelings of discomfort or pain. LRT passed out water after session was over and encouraged pts do drink and stay hydrated.  Goal Area(s) Addressed: Patient will learn benefits of physical activity. Patient will identify exercise as a coping skill.  Patient will follow multistep directions. Patient will try a new leisure interest.   Affect/Mood: Appropriate   Participation Level: Active and Engaged   Participation Quality: Independent   Behavior: Calm and Cooperative   Speech/Thought Process: Coherent   Insight: Good   Judgement: Good   Modes of Intervention: Activity   Patient Response to Interventions:  Attentive, Engaged, Interested , and Receptive   Education Outcome:  Acknowledges education   Clinical Observations/Individualized Feedback: Luis Miller was active in their participation of session activities and group discussion. Pt identified "you have to be coordinated to do this type of exercise". Pt appropriately completed exercised as demonstrated. Pt interacted well with LRT and peers duration of session.   Plan: Continue to engage patient in RT group sessions 2-3x/week.   Rosina Lowenstein, LRT, CTRS 09/22/2022 3:04 PM

## 2022-09-22 NOTE — BH IP Treatment Plan (Signed)
Interdisciplinary Treatment and Diagnostic Plan Update  09/22/2022 Time of Session: 10:00AM Luis Miller. MRN: [id number]  Principal Diagnosis: Bipolar depression (HCC)  Secondary Diagnoses: Principal Problem:   Bipolar depression (HCC) Active Problems:   Malfunction of arteriovenous graft (HCC)   Current Medications:  Current Facility-Administered Medications  Medication Dose Route Frequency Provider Last Rate Last Admin   acetaminophen (TYLENOL) tablet 650 mg  650 mg Oral Q6H PRN Schnier, Latina Craver, MD   650 mg at 09/19/22 0137   allopurinol (ZYLOPRIM) tablet 100 mg  100 mg Oral BID Renford Dills, MD   100 mg at 09/22/22 0912   alteplase (CATHFLO ACTIVASE) injection 2 mg  2 mg Intracatheter Once PRN Schnier, Latina Craver, MD       alum & mag hydroxide-simeth (MAALOX/MYLANTA) 200-200-20 MG/5ML suspension 30 mL  30 mL Oral Q4H PRN Schnier, Latina Craver, MD       ARIPiprazole (ABILIFY) tablet 20 mg  20 mg Oral QPC breakfast Schnier, Latina Craver, MD   20 mg at 09/22/22 0911   benazepril (LOTENSIN) tablet 20 mg  20 mg Oral QPM Schnier, Latina Craver, MD   20 mg at 09/21/22 1804   buPROPion (WELLBUTRIN XL) 24 hr tablet 150 mg  150 mg Oral Daily Schnier, Latina Craver, MD   150 mg at 09/22/22 0911   cholecalciferol (VITAMIN D3) 25 MCG (1000 UNIT) tablet 1,000 Units  1,000 Units Oral Daily Schnier, Latina Craver, MD   1,000 Units at 09/22/22 0912   doxepin (SINEQUAN) capsule 100 mg  100 mg Oral QHS Schnier, Latina Craver, MD   100 mg at 09/21/22 2130   epoetin alfa (EPOGEN) injection 4,000 Units  4,000 Units Intravenous Q T,Th,Sa-HD Schnier, Latina Craver, MD   4,000 Units at 09/21/22 1548   feeding supplement (NEPRO CARB STEADY) liquid 237 mL  237 mL Oral Q24H Schnier, Latina Craver, MD   237 mL at 09/20/22 1232   fluticasone (FLONASE) 50 MCG/ACT nasal spray 2 spray  2 spray Each Nare Daily PRN Schnier, Latina Craver, MD       heparin injection 1,000 Units  1,000 Units Dialysis PRN Schnier, Latina Craver, MD        lamoTRIgine (LAMICTAL) tablet 25 mg  25 mg Oral [id number] Schnier, Latina Craver, MD   25 mg at 09/22/22 0912   lidocaine (PF) (XYLOCAINE) 1 % injection 5 mL  5 mL Intradermal PRN Schnier, Latina Craver, MD       lidocaine (PF) (XYLOCAINE) 1 % injection    PRN Schnier, Latina Craver, MD   10 mL at 09/21/22 1224   lidocaine-prilocaine (EMLA) cream 1 Application  1 Application Topical PRN Schnier, Latina Craver, MD   1 Application at 08/12/22 0700   loperamide (IMODIUM) capsule 2 mg  2 mg Oral PRN Schnier, Latina Craver, MD   2 mg at 08/28/22 1711   loratadine (CLARITIN) tablet 10 mg  10 mg Oral Daily Schnier, Latina Craver, MD   10 mg at 09/22/22 0912   pentafluoroprop-tetrafluoroeth (GEBAUERS) aerosol 1 Application  1 Application Topical PRN Schnier, Latina Craver, MD       polyethylene glycol (MIRALAX / GLYCOLAX) packet 17 g  17 g Oral BID PRN Renford Dills, MD   17 g at 08/23/22 1702   potassium chloride (KLOR-CON M) CR tablet 10 mEq  10 mEq Oral Daily Schnier, Latina Craver, MD   10 mEq at 09/22/22 0912   rosuvastatin (CRESTOR) tablet 10 mg  10 mg Oral Daily Schnier,  Latina Craver, MD   10 mg at 09/22/22 0912   senna-docusate (Senokot-S) tablet 2 tablet  2 tablet Oral QPC breakfast Schnier, Latina Craver, MD   2 tablet at 09/22/22 0911   traZODone (DESYREL) tablet 100 mg  100 mg Oral QHS Schnier, Latina Craver, MD   100 mg at 09/21/22 2130   PTA Medications: Medications Prior to Admission  Medication Sig Dispense Refill Last Dose   amLODipine (NORVASC) 10 MG tablet TAKE 1 TABLET DAILY 90 tablet 1 08/19/2022   benazepril (LOTENSIN) 40 MG tablet Take 40 mg by mouth daily.    08/19/2022   calcitRIOL (ROCALTROL) 0.25 MCG capsule Take 0.25 mcg by mouth daily.   08/19/2022   FLUoxetine (PROZAC) 20 MG capsule Take 1 capsule (20 mg total) by mouth daily. 90 capsule 0 08/19/2022   fluticasone (FLONASE) 50 MCG/ACT nasal spray Place 2 sprays into both nostrils daily. 16 g 6 08/19/2022   hydrALAZINE (APRESOLINE) 100 MG tablet Take 100 mg by mouth 3  (three) times daily.   08/19/2022   lamoTRIgine (LAMICTAL) 25 MG tablet TAKE 1 TABLET DAILY 90 tablet 0 08/19/2022   loperamide (IMODIUM) 2 MG capsule Take 2 mg by mouth daily as needed for diarrhea or loose stools.   08/19/2022   loratadine (CLARITIN) 10 MG tablet TAKE 1 TABLET BY MOUTH EVERY DAY 90 tablet 1 08/19/2022   Multiple Vitamin (MULTIVITAMIN) tablet Take 1 tablet by mouth daily.   08/19/2022   OLANZapine (ZYPREXA) 5 MG tablet Take 1 tablet (5 mg total) by mouth at bedtime. 90 tablet 0 08/19/2022   rosuvastatin (CRESTOR) 10 MG tablet TAKE 1 TABLET BY MOUTH EVERY DAY 90 tablet 0 08/19/2022   Vitamin D, Cholecalciferol, 25 MCG (1000 UT) TABS Take 1,000 tablets by mouth daily.    08/19/2022   allopurinol (ZYLOPRIM) 100 MG tablet Take 100 mg by mouth 2 (two) times daily.       Patient Stressors: Medication change or noncompliance    Patient Strengths: Ability for insight  Average or above average intelligence  Capable of independent living  Communication skills  Motivation for treatment/growth  Supportive family/friends   Treatment Modalities: Medication Management, Group therapy, Case management,  1 to 1 session with clinician, Psychoeducation, Recreational therapy.   Physician Treatment Plan for Primary Diagnosis: Bipolar depression (HCC) Long Term Goal(s): Improvement in symptoms so as ready for discharge   Short Term Goals: Compliance with prescribed medications will improve Ability to verbalize feelings will improve  Medication Management: Evaluate patient's response, side effects, and tolerance of medication regimen.  Therapeutic Interventions: 1 to 1 sessions, Unit Group sessions and Medication administration.  Evaluation of Outcomes: Progressing  Physician Treatment Plan for Secondary Diagnosis: Principal Problem:   Bipolar depression (HCC) Active Problems:   Malfunction of arteriovenous graft (HCC)  Long Term Goal(s): Improvement in symptoms so as ready for discharge    Short Term Goals: Compliance with prescribed medications will improve Ability to verbalize feelings will improve     Medication Management: Evaluate patient's response, side effects, and tolerance of medication regimen.  Therapeutic Interventions: 1 to 1 sessions, Unit Group sessions and Medication administration.  Evaluation of Outcomes: Progressing   RN Treatment Plan for Primary Diagnosis: Bipolar depression (HCC) Long Term Goal(s): Knowledge of disease and therapeutic regimen to maintain health will improve  Short Term Goals: Ability to demonstrate self-control, Ability to participate in decision making will improve, Ability to verbalize feelings will improve, Ability to disclose and discuss suicidal ideas, Ability to identify  and develop effective coping behaviors will improve, and Compliance with prescribed medications will improve  Medication Management: RN will administer medications as ordered by provider, will assess and evaluate patient's response and provide education to patient for prescribed medication. RN will report any adverse and/or side effects to prescribing provider.  Therapeutic Interventions: 1 on 1 counseling sessions, Psychoeducation, Medication administration, Evaluate responses to treatment, Monitor vital signs and CBGs as ordered, Perform/monitor CIWA, COWS, AIMS and Fall Risk screenings as ordered, Perform wound care treatments as ordered.  Evaluation of Outcomes: Progressing   LCSW Treatment Plan for Primary Diagnosis: Bipolar depression (HCC) Long Term Goal(s): Safe transition to appropriate next level of care at discharge, Engage patient in therapeutic group addressing interpersonal concerns.  Short Term Goals: Engage patient in aftercare planning with referrals and resources, Increase social support, Increase ability to appropriately verbalize feelings, Increase emotional regulation, Facilitate acceptance of mental health diagnosis and concerns, and  Increase skills for wellness and recovery  Therapeutic Interventions: Assess for all discharge needs, 1 to 1 time with Social worker, Explore available resources and support systems, Assess for adequacy in community support network, Educate family and significant other(s) on suicide prevention, Complete Psychosocial Assessment, Interpersonal group therapy.  Evaluation of Outcomes: Progressing   Progress in Treatment: Attending groups: Yes. Participating in groups: No. Taking medication as prescribed: Yes. Toleration medication: Yes. Family/Significant other contact made: Yes, individual(s) contacted:  SPE completed with the patient and son. Patient understands diagnosis: Yes. Discussing patient identified problems/goals with staff: Yes. Medical problems stabilized or resolved: Yes. Denies suicidal/homicidal ideation: Yes. Issues/concerns per patient self-inventory: No. Other: none  New Short Term/Long Term Goal(s): Patient to work towards medication management for mood stabilization; elimination of SI thoughts; development of comprehensive mental wellness plan. Update 6/8: No update at this time.  Update 09/02/2022: No changes at this time. Update 09/07/2022:  No changes at this time. Update 09/12/22 No changes at this time Update 09/17/22 No changes at this time Update 09/22/2022:  No changes at this time.   Patient Goals:   No additional goals identified at this time. Patient to continue to work towards original goals identified in initial treatment team meeting. CSW will remain available to patient should they voice additional treatment goals. Update 6/8: The patient has not identified any new goals at this time. Update 09/02/2022: No changes at this time. Update 09/07/2022:  No changes at this time. Update 09/12/22 No changes at this time Update 09/17/22 No changes at this time. Update 09/22/2022:  No changes at this time.     Discharge Plan or Barriers: No psychosocial barriers identified at this  time, patient to return to place of residence when appropriate for discharge. Update 6/8: There are no new barriers identified at this time. The patient will return home, the doctor has not provided a discharge date as of yet. Update 09/02/2022: CSW notes progress from patient. However,  patient does not acknowledge his progress.  Patient reports that he needs help at home, however, is not able to identify what support is needed.  The things identified patient was able to acknowledge that he addresses those things on his own at home and at the hospital.  Update 09/07/2022:  Patient remains somewhat apprehensive towards discharge. Patient reports that he will return home when it is time for him to be discharged.  Update 6/23/254 CSW discussed having an OT order placed to help pt with meds in preparation for discharge. Update 09/17/22 Pt received OT visit and an order  for home health which is already set up through [facility] for discharge.  Update 09/22/2022:  Patient continues to report concerns of returning to his home, however, is unable to identify concerns for returning to the home.  He has home health set up as well as an aftercare appointment with his mental health provider.    Reason for Continuation of Hospitalization: Depression Medication stabilization   Estimated Length of Stay: 1-7 days Update 6/8: TBD Update 09/02/2022: TBD Update 09/07/2022:  TBD Update 09/12/22 TBD Update 09/17/22 TBD, doctor expects to discharge early next week Update 09/22/2022:  No changes at this time.   Last 3 [place] Suicide Severity Risk Score: Flowsheet Row Admission (Current) from 07/30/2022 in [facility] Most recent reading at 08/23/2022 12:20 PM ED from 07/30/2022 in [facility] Emergency Department at [facility] Most recent reading at 07/30/2022  3:10 PM Office Visit from 07/26/2022 in confidential department Most recent reading at 07/27/2022 11:27 AM  C-SSRS RISK CATEGORY No Risk No Risk No  Risk       Last PHQ 2/9 Scores:    07/26/2022    9:10 AM 02/24/2022    1:14 PM 01/25/2022    1:34 PM  Depression screen PHQ 2/9  Decreased Interest 1 0 0  Down, Depressed, Hopeless 1 0 0  PHQ - 2 Score 2 0 0  Altered sleeping 0 0 0  Tired, decreased energy 0 0 0  Change in appetite 0 0 0  Feeling bad or failure about yourself  1 0 0  Trouble concentrating 0 0 0  Moving slowly or fidgety/restless 0 0 0  Suicidal thoughts 0 0 0  PHQ-9 Score 3 0 0  Difficult doing work/chores  Not difficult at all     Scribe for Treatment Team: Harden Mo, LCSW 09/22/2022 11:16 AM

## 2022-09-22 NOTE — Progress Notes (Signed)
   09/22/22 2300  Psych Admission Type (Psych Patients Only)  Admission Status Voluntary  Psychosocial Assessment  Patient Complaints Worrying;Nervousness  Eye Contact Fair  Facial Expression Flat  Affect Sullen  Speech Soft  Interaction Assertive  Motor Activity Slow  Appearance/Hygiene In scrubs  Behavior Characteristics Cooperative  Mood Sad  Thought Process  Coherency WDL  Content WDL  Delusions None reported or observed  Perception WDL  Hallucination None reported or observed  Judgment WDL  Confusion None  Danger to Self  Current suicidal ideation? Denies  Agreement Not to Harm Self Yes  Description of Agreement verbal  Danger to Others  Danger to Others None reported or observed
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
## 2022-09-22 NOTE — Progress Notes (Signed)
Patient is on a renal diet with a 1200 mL fluid restriction/strict intake and output.

## 2022-09-22 NOTE — Care Management Important Message (Signed)
Important Message  Patient Details  Name: Luis Miller. MRN: [id number] Date of Birth: 1946-03-12   Medicare Important Message Given:  Yes     Harden Mo, LCSW 09/22/2022, 11:44 AM

## 2022-09-22 NOTE — Plan of Care (Signed)

## 2022-09-23 DIAGNOSIS — F319 Bipolar disorder, unspecified: Secondary | ICD-10-CM | POA: Diagnosis not present

## 2022-09-23 LAB — CBC
HCT: 32.1 % — ABNORMAL LOW (ref 39.0–52.0)
Hemoglobin: 11 g/dL — ABNORMAL LOW (ref 13.0–17.0)
MCH: 35.5 pg — ABNORMAL HIGH (ref 26.0–34.0)
MCHC: 34.3 g/dL (ref 30.0–36.0)
MCV: 103.5 fL — ABNORMAL HIGH (ref 80.0–100.0)
Platelets: 135 10*3/uL — ABNORMAL LOW (ref 150–400)
RBC: 3.1 MIL/uL — ABNORMAL LOW (ref 4.22–5.81)
RDW: 14.9 % (ref 11.5–15.5)
WBC: 5.4 10*3/uL (ref 4.0–10.5)
nRBC: 0 % (ref 0.0–0.2)

## 2022-09-23 NOTE — Progress Notes (Signed)
   09/23/22 1610  15 Minute Checks  Location Bedroom  Visual Appearance Calm  Behavior Sleeping  Sleep (Behavioral Health Patients Only)  Calculate sleep? (Click Yes once per 24 hr at 0600 safety check) Yes  Documented sleep last 24 hours 8
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
## 2022-09-23 NOTE — Progress Notes (Signed)
Pt was seen by Dr Marlou Porch this morning. Pt concerned about discharge and is concerned that he can not get his medications in order once he returns home. This writer reassured pt that home health services can help him with his medications after discharge. Pt denies any S/I or H/I, And denies AVH

## 2022-09-23 NOTE — Progress Notes (Signed)
Pt is cooperative. Pt denies S/I or H/I. Pt is compliant with medications. Dialysis rescheduled for tomorrow.

## 2022-09-23 NOTE — Plan of Care (Signed)
D- Patient alert and oriented. Mood is sad,. Denies SI, HI, AVH, and pain. Pt voiced concern about taking medication at home after discharge. This Clinical research associate reminds pt that he will have home health to help with medications.  A- Scheduled medications administered to patient, per MD orders. Support and encouragement provided.  Routine safety checks conducted every 15 minutes.  Patient informed to notify staff with problems or concerns.  R- No adverse drug reactions noted. Patient contracts for safety at this time. Patient compliant with medications and treatment plan. Patient receptive, calm, and cooperative. Patient interacts well with others on the unit.  Patient remains safe at this time.

## 2022-09-23 NOTE — Group Note (Signed)
Recreation Therapy Group Note   Group Topic:Communication  Group Date: 09/23/2022 Start Time: 1300 End Time: 1350 Facilitators: Clinton [place], [place] Location:  Day Room  Group Description: Fourth of July Games. LRT and patients played Patriotic Bingo, "Can you name three?", and 4th of July Trivia. Pt won prizes for [employer] of bingo. LRT prompted communication between peers. Patriotic music was playing in the background throughout group.   Goal Area(s) Addressed: Patient will increase communication skills by talking with LRT and peers.  Patient will increase frustration tolerance skills.    Affect/Mood: Appropriate   Participation Level: Active and Engaged   Participation Quality: Independent   Behavior: Appropriate   Speech/Thought Process: Coherent   Insight: Good   Judgement: Good   Modes of Intervention: Cooperative Play and Open Conversation   Patient Response to Interventions:  Attentive, Engaged, Interested , and Receptive   Education Outcome:  Acknowledges education   Clinical Observations/Individualized Feedback: Luis Miller was active in their participation of session activities and group discussion. Pt identified "have or go to a cookout with good food" as a holiday tradition. Pt appropriately played games while interacting well with LRT and peers. Pt chose a stress ball as a Retail banker.    Plan: Continue to engage patient in RT group sessions 2-3x/week.   Luis Miller, LRT, CTRS 09/23/2022 2:24 PM

## 2022-09-23 NOTE — Group Note (Unsigned)
Date:  09/23/2022 Time:  10:52 PM  Group Topic/Focus:  Crisis Planning:   The purpose of this group is to help patients create a crisis plan for use upon discharge or in the future, as needed. Goals Group:   The focus of this group is to help patients establish daily goals to achieve during treatment and discuss how the patient can incorporate goal setting into their daily lives to aide in recovery.     Participation Level:  {BHH PARTICIPATION LEVEL:22264}  Participation Quality:  {BHH PARTICIPATION QUALITY:22265}  Affect:  {BHH AFFECT:22266}  Cognitive:  {BHH COGNITIVE:22267}  Insight: {BHH Insight2:20797}  Engagement in Group:  {BHH ENGAGEMENT IN GROUP:22268}  Modes of Intervention:  {BHH MODES OF INTERVENTION:22269}  Additional Comments:  ***  [name] 09/23/2022, 10:52 PM  

## 2022-09-23 NOTE — Progress Notes (Signed)
   09/23/22 2200  Psych Admission Type (Psych Patients Only)  Admission Status Voluntary  Psychosocial Assessment  Patient Complaints Worrying;Nervousness  Eye Contact Fair  Facial Expression Flat  Affect Sullen  Speech Soft  Interaction Assertive  Motor Activity Slow  Appearance/Hygiene In scrubs  Behavior Characteristics Cooperative  Mood Sad  Aggressive Behavior  Effect No apparent injury  Thought Process  Coherency WDL  Content WDL  Delusions None reported or observed  Perception WDL  Hallucination None reported or observed  Judgment WDL  Confusion None  Danger to Self  Current suicidal ideation? Denies  Agreement Not to Harm Self Yes  Description of Agreement verbal

## 2022-09-24 LAB — RENAL FUNCTION PANEL
Albumin: 3.6 g/dL (ref 3.5–5.0)
Anion gap: 12 (ref 5–15)
BUN: 77 mg/dL — ABNORMAL HIGH (ref 8–23)
CO2: 23 mmol/L (ref 22–32)
Calcium: 9 mg/dL (ref 8.9–10.3)
Chloride: 103 mmol/L (ref 98–111)
Creatinine, Ser: 5.94 mg/dL — ABNORMAL HIGH (ref 0.61–1.24)
GFR, Estimated: 9 mL/min — ABNORMAL LOW (ref >60)
Glucose, Bld: 105 mg/dL — ABNORMAL HIGH (ref 70–99)
Phosphorus: 4.4 mg/dL (ref 2.5–4.6)
Potassium: 3.9 mmol/L (ref 3.5–5.1)
Sodium: 138 mmol/L (ref 135–145)

## 2022-09-24 LAB — CBC
HCT: 28.4 % — ABNORMAL LOW (ref 39.0–52.0)
Hemoglobin: 9.7 g/dL — ABNORMAL LOW (ref 13.0–17.0)
MCH: 35.4 pg — ABNORMAL HIGH (ref 26.0–34.0)
MCHC: 34.2 g/dL (ref 30.0–36.0)
MCV: 103.6 fL — ABNORMAL HIGH (ref 80.0–100.0)
Platelets: 117 10*3/uL — ABNORMAL LOW (ref 150–400)
RBC: 2.74 MIL/uL — ABNORMAL LOW (ref 4.22–5.81)
RDW: 14.8 % (ref 11.5–15.5)
WBC: 4.7 10*3/uL (ref 4.0–10.5)
nRBC: 0 % (ref 0.0–0.2)

## 2022-09-24 MED ORDER — EPOETIN ALFA 4000 UNIT/ML IJ SOLN
INTRAMUSCULAR | Status: AC
  Filled 2022-09-24: qty 1

## 2022-09-24 MED ORDER — TRAZODONE HCL 100 MG PO TABS
100.0000 mg | ORAL_TABLET | Freq: Every evening | ORAL | Status: DC | PRN
  Administered 2022-09-24: 100 mg via ORAL
  Filled 2022-09-24: qty 1

## 2022-09-24 MED ORDER — OLANZAPINE 5 MG PO TABS
10.0000 mg | ORAL_TABLET | Freq: Every day | ORAL | Status: DC
  Administered 2022-09-24 – 2022-09-25 (×2): 10 mg via ORAL
  Filled 2022-09-24 (×3): qty 2
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
## 2022-09-24 NOTE — Group Note (Signed)
[facility] LCSW Group Therapy Note   Group Date: 09/24/2022 Start Time: 1315 End Time: 1355   Type of Therapy and Topic: Group Therapy: Avoiding Self-Sabotaging and Enabling Behaviors  Participation Level: Active  Mood:  Description of Group:  In this group, patients will learn how to identify obstacles, self-sabotaging and enabling behaviors, as well as: what are they, why do we do them and what needs these behaviors meet. Discuss unhealthy relationships and how to have positive healthy boundaries with those that sabotage and enable. Explore aspects of self-sabotage and enabling in yourself and how to limit these self-destructive behaviors in everyday life.  Discuss challenging irrational thoughts and how to address them.   Therapeutic Goals: 1. Patient will identify one obstacle that relates to self-sabotage and enabling behaviors 2. Patient will identify one personal self-sabotaging or enabling behavior they did prior to admission 3. Patient will state a plan to change the above identified behavior 4. Patient will demonstrate ability to communicate their needs through discussion and/or role play.    Summary of Patient Progress: Patient was present in group.  Patient discussed how he has been having negative thoughts on returning home.  He reports that he does not feel that he can care for himself.  He reports that he thinks his family wants him to reutrn and believe that he can care for himself, however, he is not confident.  Patient appeared alert as discussion was on how the patient can challenge these thoughts, however, patient did not appear confident in challenging these thought or applying the coping skills.   Therapeutic Modalities:  Cognitive Behavioral Therapy Person-Centered Therapy Motivational Interviewing    Harden Mo, LCSW

## 2022-09-24 NOTE — Group Note (Signed)
Recreation Therapy Group Note   Group Topic:Emotion Expression  Group Date: 09/24/2022 Start Time: 1400 End Time: 1450 Facilitators: Rosina Lowenstein, LRT, CTRS Location:  Dayroom  Group Description: Emotional Check in. Patient sat and talked with LRT about how they are doing and whatever else is on their mind. LRT provided active listening, reassurance and encouragement.     Goal Area(s) Addressed: Patient will engage in conversation with LRT. Patient will communicate their wants, needs, or questions.  Patient will practice a new coping skill of "talking to someone".   Affect/Mood: Flat   Participation Level: Active and Engaged   Participation Quality: Independent   Behavior: Appropriate   Speech/Thought Process: Coherent   Insight: Good   Judgement: Good   Modes of Intervention: Open Conversation   Patient Response to Interventions:  Attentive and Engaged   Education Outcome:  Acknowledges education   Clinical Observations/Individualized Feedback: Luis Miller was active in their participation of session activities and group discussion. Pt identified that he was scared to go home and doesn't think that he will be able to handle living alone. Pt shared that he felt like he was bringing everyone down in the group for being sad and expressing himself. Pt shared that he was tired of trying and doesn't think he will get any better. Pt shared that he thinks staying in the hospital is making him worse at the point but that he doesn't want to go home. LRT provided encouragement and reassurance.    Plan: Continue to engage patient in RT group sessions 2-3x/week.   Rosina Lowenstein, LRT, CTRS 09/24/2022 3:18 PM

## 2022-09-24 NOTE — Progress Notes (Signed)
[facility]  Dialysis Note   Subjective:   Patient seen and evaluated during dialysis   HEMODIALYSIS FLOWSHEET:  Blood Flow Rate (mL/min): 300 mL/min Arterial Pressure (mmHg): -270 mmHg Venous Pressure (mmHg): 190 mmHg TMP (mmHg): 5 mmHg Ultrafiltration Rate (mL/min): 600 mL/min Dialysate Flow Rate (mL/min): 300 ml/min Dialysis Fluid Bolus: Normal Saline Bolus Amount (mL): 100 mL  Difficult stick, however treatment going well with reduced BFR  Objective:  Vital signs in last 24 hours:  Temp:  [97.8 F (36.6 C)-99.1 F (37.3 C)] 97.8 F (36.6 C) (07/05 0730) Pulse Rate:  [68-101] 68 (07/05 0915) Resp:  [12-18] 14 (07/05 0915) BP: (116-157)/(66-77) 157/74 (07/05 0915) SpO2:  [93 %-100 %] 93 % (07/05 0915) Weight:  [80.4 kg] 80.4 kg (07/05 0741)  Weight change:  Filed Weights   09/21/22 1406 09/21/22 1724 09/24/22 0741  Weight: 80.9 kg 80.2 kg 80.4 kg    Intake/Output: I/O last 3 completed shifts: In: -  Out: 1000 [Urine:1000]   Intake/Output this shift:  No intake/output data recorded.  Physical Exam: General: NAD, flat affect  Head: Normocephalic, atraumatic. Moist oral mucosal membranes  Eyes: Anicteric  Lungs:  Clear to auscultation  Heart: Regular rate and rhythm  Abdomen:  Soft, nontender  Extremities: No peripheral edema.  Neurologic: Alert, moving all four extremities  Skin: No lesions  Access: Lt AVF    Basic Metabolic Panel: Recent Labs  Lab 09/18/22 0743 09/21/22 1419 09/24/22 0837  NA 136 138 138  K 3.8 4.4 3.9  CL 100 102 103  CO2 26 24 23   GLUCOSE 120* 171* 105*  BUN 66* 80* 77*  CREATININE 5.21* 6.24* 5.94*  CALCIUM 9.1 8.9 9.0  PHOS 3.7 4.3 4.4     Liver Function Tests: Recent Labs  Lab 09/18/22 0743 09/21/22 1419 09/24/22 0837  ALBUMIN 3.8 3.9 3.6    No results for input(s): "LIPASE", "AMYLASE" in the last 168 hours. No results for input(s): "AMMONIA" in the last 168 hours.  CBC: Recent Labs  Lab  09/18/22 0743 09/20/22 0922 09/21/22 1538 09/23/22 0958 09/24/22 0837  WBC 4.3 4.5 3.5* 5.4 4.7  HGB 10.3* 10.1* 10.0* 11.0* 9.7*  HCT 30.2* 29.7* 29.2* 32.1* 28.4*  MCV 103.8* 104.6* 103.9* 103.5* 103.6*  PLT 148* 128* 134* 135* 117*     Cardiac Enzymes: No results for input(s): "CKTOTAL", "CKMB", "CKMBINDEX", "TROPONINI" in the last 168 hours.  BNP: Invalid input(s): "POCBNP"  CBG: No results for input(s): "GLUCAP" in the last 168 hours.  Microbiology: Results for orders placed or performed in visit on 05/12/22  Microscopic Examination     Status: Abnormal   Collection Time: 05/12/22 10:39 AM   Urine  Result Value Ref Range Status   WBC, UA 6-10 (A) 0 - 5 /hpf Final   RBC, Urine 0-2 0 - 2 /hpf Final   Epithelial Cells (non renal) 0-10 0 - 10 /hpf Final   Bacteria, UA Moderate (A) None seen/Few Final    Coagulation Studies: No results for input(s): "LABPROT", "INR" in the last 72 hours.  Urinalysis: No results for input(s): "COLORURINE", "LABSPEC", "PHURINE", "GLUCOSEU", "HGBUR", "BILIRUBINUR", "KETONESUR", "PROTEINUR", "UROBILINOGEN", "NITRITE", "LEUKOCYTESUR" in the last 72 hours.  Invalid input(s): "APPERANCEUR"    Imaging: No results found.   Medications:     allopurinol  100 mg Oral BID   ARIPiprazole  20 mg Oral QPC breakfast   benazepril  20 mg Oral QPM   buPROPion  150 mg Oral Daily   cholecalciferol  1,000 Units Oral Daily   doxepin  100 mg Oral QHS   epoetin (EPOGEN/PROCRIT) injection  4,000 Units Intravenous Q T,Th,Sa-HD   feeding supplement (NEPRO CARB STEADY)  237 mL Oral Q24H   lamoTRIgine  25 mg Oral [id number]   loratadine  10 mg Oral Daily   potassium chloride  10 mEq Oral Daily   rosuvastatin  10 mg Oral Daily   senna-docusate  2 tablet Oral QPC breakfast   traZODone  100 mg Oral QHS   acetaminophen, alteplase, alum & mag hydroxide-simeth, fluticasone, heparin, lidocaine (PF), lidocaine (PF), lidocaine-prilocaine, loperamide,  pentafluoroprop-tetrafluoroeth, polyethylene glycol  Assessment/ Plan:  [name]. is a 77 y.o.  male white male with end stage renal disease on hemodialysis, hypertension, gout, depression, history of melanoma, crohn's, and CVA who presents to [facility] on 07/30/2022 for Bipolar depression (HCC) [F31.9]   Principal Problem:   Bipolar depression (HCC) End-stage renal disease.  **Due to extended admission, patient will need to be re-established with outpatient clinic prior to discharge. Please contact renal navigator for discharge updates**   End Stage Renal Disease on hemodialysis:  Treatment not received Thursday due to holiday scheduling. Patient receiving treatment today, UF goal 1L as tolerated. Next treatment scheduled for Tuesday.  Plan to for discharge after treatment today or tomorrow If discharged today, patient can resume outpatient treatments at assigned clinic.    Potassium [facility] vascular lab)  Date Value Ref Range Status  09/28/2021 3.6 3.5 - 5.1 mmol/L Final    Comment:    Performed at [facility], [address] [place], [place] [address]    Intake/Output Summary (Last 24 hours) at 09/24/2022 1000 Last data filed at 09/23/2022 1500 Gross per 24 hour  Intake --  Output 1000 ml  Net -1000 ml     2. Hypertension with chronic kidney disease: Maintain the patient on benazepril at this time. BP (!) 157/74 (BP Location: Right Arm)   Pulse 68   Temp 97.8 F (36.6 C) (Oral)   Resp 14   Ht 5\' 6"  (1.676 m)   Wt 80.4 kg   SpO2 93%   BMI 28.61 kg/m   Blood pressure stable during dialysis  3. Anemia of chronic kidney disease/ kidney injury/chronic disease/acute blood loss:   Lab Results  Component Value Date   HGB 9.7 (L) 09/24/2022  Hgb at goal. Continue Epogen 4000 units IV with dialysis.   4. Secondary Hyperparathyroidism:     Lab Results  Component Value Date   CALCIUM 9.0 09/24/2022   CAION 1.17 11/05/2021   PHOS 4.4 09/24/2022   Bone  minerals within desired range.     LOS: [address] [facility] 7/5/202410:00 AM

## 2022-09-24 NOTE — Group Note (Signed)
Date:  09/24/2022 Time:  3:22 PM  Group Topic/Focus:  Managing Feelings:   The focus of this group is to identify what feelings patients have difficulty handling and develop a plan to handle them in a healthier way upon discharge.    Participation Level:  Active  Participation Quality:  Appropriate  Affect:  Appropriate  Cognitive:  Appropriate  Insight: Appropriate  Engagement in Group:  Engaged  Modes of Intervention:  Activity  Additional Comments:  None  [name] 09/24/2022, 3:22 PM

## 2022-09-24 NOTE — Plan of Care (Signed)
D- Patient alert and oriented. Mood is sad, affect restricted.Denies SI, HI, AVH, and pain. Pt is social with staff and peers.   A- Scheduled medications administered to patient, per MD orders. Support and encouragement provided.  Routine safety checks conducted every 15 minutes.  Patient informed to notify staff with problems or concerns.  R- No adverse drug reactions noted. Patient contracts for safety at this time. Patient compliant with medications and treatment plan. Patient receptive, calm, and cooperative. Patient interacts well with others on the unit.  Patient remains safe at this time.

## 2022-09-24 NOTE — Progress Notes (Signed)
Hemodialysis Note  Received patient in bed to unit. Alert and oriented. Informed consent signed and in chart.   Treatment initiated:0915 Treatment completed:1230  Patient tolerated treatment well. Transported back to the room alert, without acute distress. Report given to patient's RN.  Access used: LUA AVF Access issues: Difficulty cannulating arterial site resulting in patient being stuck twice. BFR reduced to keep arterial pressures in safe parameters.   Total UF removed:1 liter/ 1000 ml Medications given:Epogen Post HD ZO:[id number]  Post HD weight:79.8 kg  Bartolo Darter, RN  [facility]

## 2022-09-24 NOTE — Progress Notes (Signed)
Patient A&Ox4. Patient denies SI/Hi and AVH. Patient attended and participated in group. Patient is compliant with medications. No acute distress noted. Support and encouragement provided. Routine safety checks conducted according to facility protocol. Will continue to monitor for safety.

## 2022-09-25 MED ORDER — TRAZODONE HCL 50 MG PO TABS
150.0000 mg | ORAL_TABLET | Freq: Every day | ORAL | Status: DC
  Administered 2022-09-25: 150 mg via ORAL
  Filled 2022-09-25 (×2): qty 1

## 2022-09-25 NOTE — Plan of Care (Signed)
  Problem: Elimination: Goal: Will not experience complications related to bowel motility Outcome: Progressing Goal: Will not experience complications related to urinary retention Outcome: Progressing   Problem: Pain Managment: Goal: General experience of comfort will improve Outcome: Progressing Patient is flat with depressed affect denies HI/A/VH stated he is not sure if he gets discharged "I cannot promise if I can say I will be safe when I am by myself I think I will hurt myself" Patient verbally contracts for safety. Patient is anxious reports did not sleep well this night.  Patient compliant with treatment. Support and encouragement provided.

## 2022-09-25 NOTE — Group Note (Signed)
Date:  09/25/2022 Time:  10:53 AM  Group Topic/Focus:  Rediscovering Joy:   The focus of this group is to explore various ways to relieve stress in a positive manner. Self Care:   The focus of this group is to help patients understand the importance of self-care in order to improve or restore emotional, physical, spiritual, interpersonal, and financial health.    Participation Level:  Did Not Attend  Participation Quality:      Affect:    Cognitive:      Insight: None  Engagement in Group:  None  Modes of Intervention:      Additional Comments:     Leonie Green 09/25/2022, 10:53 AM

## 2022-09-25 NOTE — Progress Notes (Signed)
[facility]  Dialysis Note   Subjective:   Patient seen and evaluated during dialysis   HEMODIALYSIS FLOWSHEET:  Blood Flow Rate (mL/min): 350 mL/min Arterial Pressure (mmHg): -190 mmHg Venous Pressure (mmHg): 200 mmHg TMP (mmHg): 22 mmHg Ultrafiltration Rate (mL/min): 543 mL/min Dialysate Flow Rate (mL/min): 300 ml/min Dialysis Fluid Bolus: Normal Saline Bolus Amount (mL): 100 mL  Tolerating treatment well  Objective:  Vital signs in last 24 hours:  Temp:  [97.8 F (36.6 C)-98.1 F (36.7 C)] 97.8 F (36.6 C) (07/06 0849) Pulse Rate:  [60-91] 60 (07/06 0924) Resp:  [14-17] 16 (07/06 0924) BP: (95-124)/(55-76) 95/55 (07/06 0924) SpO2:  [83 %-100 %] 99 % (07/06 0924) Weight:  [79.8 kg-80.2 kg] 80.2 kg (07/06 0851)  Weight change:  Filed Weights   09/24/22 0741 09/24/22 1754 09/25/22 0851  Weight: 80.4 kg 79.8 kg 80.2 kg    Intake/Output: I/O last 3 completed shifts: In: -  Out: 2000 [Urine:1000; Other:1000]   Intake/Output this shift:  Total I/O In: 240 [P.O.:240] Out: 300 [Urine:300]  Physical Exam: General: NAD, flat affect  Head: Normocephalic, atraumatic. Moist oral mucosal membranes  Eyes: Anicteric  Lungs:  Clear to auscultation  Heart: Regular rate and rhythm  Abdomen:  Soft, nontender  Extremities: No peripheral edema.  Neurologic: Alert, moving all four extremities  Skin: No lesions  Access: Lt AVF    Basic Metabolic Panel: Recent Labs  Lab 09/21/22 1419 09/24/22 0837  NA 138 138  K 4.4 3.9  CL 102 103  CO2 24 23  GLUCOSE 171* 105*  BUN 80* 77*  CREATININE 6.24* 5.94*  CALCIUM 8.9 9.0  PHOS 4.3 4.4     Liver Function Tests: Recent Labs  Lab 09/21/22 1419 09/24/22 0837  ALBUMIN 3.9 3.6    No results for input(s): "LIPASE", "AMYLASE" in the last 168 hours. No results for input(s): "AMMONIA" in the last 168 hours.  CBC: Recent Labs  Lab 09/20/22 0922 09/21/22 1538 09/23/22 0958 09/24/22 0837  WBC 4.5  3.5* 5.4 4.7  HGB 10.1* 10.0* 11.0* 9.7*  HCT 29.7* 29.2* 32.1* 28.4*  MCV 104.6* 103.9* 103.5* 103.6*  PLT 128* 134* 135* 117*     Cardiac Enzymes: No results for input(s): "CKTOTAL", "CKMB", "CKMBINDEX", "TROPONINI" in the last 168 hours.  BNP: Invalid input(s): "POCBNP"  CBG: No results for input(s): "GLUCAP" in the last 168 hours.  Microbiology: Results for orders placed or performed in visit on 05/12/22  Microscopic Examination     Status: Abnormal   Collection Time: 05/12/22 10:39 AM   Urine  Result Value Ref Range Status   WBC, UA 6-10 (A) 0 - 5 /hpf Final   RBC, Urine 0-2 0 - 2 /hpf Final   Epithelial Cells (non renal) 0-10 0 - 10 /hpf Final   Bacteria, UA Moderate (A) None seen/Few Final    Coagulation Studies: No results for input(s): "LABPROT", "INR" in the last 72 hours.  Urinalysis: No results for input(s): "COLORURINE", "LABSPEC", "PHURINE", "GLUCOSEU", "HGBUR", "BILIRUBINUR", "KETONESUR", "PROTEINUR", "UROBILINOGEN", "NITRITE", "LEUKOCYTESUR" in the last 72 hours.  Invalid input(s): "APPERANCEUR"    Imaging: No results found.   Medications:     allopurinol  100 mg Oral BID   benazepril  20 mg Oral QPM   buPROPion  150 mg Oral Daily   cholecalciferol  1,000 Units Oral Daily   doxepin  100 mg Oral QHS   epoetin (EPOGEN/PROCRIT) injection  4,000 Units Intravenous Q T,Th,Sa-HD   feeding supplement (NEPRO CARB STEADY)  237 mL Oral Q24H   lamoTRIgine  25 mg Oral [id number]   loratadine  10 mg Oral Daily   OLANZapine  10 mg Oral QHS   potassium chloride  10 mEq Oral Daily   rosuvastatin  10 mg Oral Daily   senna-docusate  2 tablet Oral QPC breakfast   acetaminophen, alteplase, alum & mag hydroxide-simeth, fluticasone, heparin, lidocaine (PF), lidocaine (PF), lidocaine-prilocaine, loperamide, pentafluoroprop-tetrafluoroeth, polyethylene glycol, traZODone  Assessment/ Plan:  [name]. is a 77 y.o.  male white male with end stage renal  disease on hemodialysis, hypertension, gout, depression, history of melanoma, crohn's, and CVA who presents to [facility] on 07/30/2022 for Bipolar depression (HCC) [F31.9]   Principal Problem:   Bipolar depression (HCC) End-stage renal disease.  **Due to extended admission, patient will need to be re-established with outpatient clinic prior to discharge. Please contact renal navigator for discharge updates**   End Stage Renal Disease on hemodialysis:  Patient received treatment yesterday due to missed treatment on Thursday.  UF 1 L achieved. Patient receiving treatment today to maintain outpatient schedule. If discharged today, patient can resume outpatient treatments at assigned clinic.    Potassium [facility] vascular lab)  Date Value Ref Range Status  09/28/2021 3.6 3.5 - 5.1 mmol/L Final    Comment:    Performed at [facility], [address] [place], [place] [address]    Intake/Output Summary (Last 24 hours) at 09/25/2022 7846 Last data filed at 09/25/2022 0807 Gross per 24 hour  Intake 240 ml  Output 2300 ml  Net -2060 ml     2. Hypertension with chronic kidney disease: Maintain the patient on benazepril at this time. BP (!) 95/55 (BP Location: Right Arm)   Pulse 60   Temp 97.8 F (36.6 C) (Oral)   Resp 16   Ht 5\' 6"  (1.676 m)   Wt 80.2 kg   SpO2 99%   BMI 28.54 kg/m   Blood pressure 103/67 during dialysis  3. Anemia of chronic kidney disease/ kidney injury/chronic disease/acute blood loss:   Lab Results  Component Value Date   HGB 9.7 (L) 09/24/2022  Continue Epogen 4000 units IV with dialysis.   4. Secondary Hyperparathyroidism:     Lab Results  Component Value Date   CALCIUM 9.0 09/24/2022   CAION 1.17 11/05/2021   PHOS 4.4 09/24/2022   Will continue to monitor bone minerals during this admission.    LOS: [id number] [place] [facility] 7/6/20249:39 AM
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
## 2022-09-25 NOTE — Group Note (Deleted)
Date:  09/25/2022 Time:  9:22 PM  Group Topic/Focus:  Healthy Communication:   The focus of this group is to discuss communication, barriers to communication, as well as healthy ways to communicate with others.     Participation Level:  {BHH PARTICIPATION AOZHY:86578}  Participation Quality:  {BHH PARTICIPATION QUALITY:22265}  Affect:  {BHH AFFECT:22266}  Cognitive:  {BHH COGNITIVE:22267}  Insight: {BHH Insight2:20797}  Engagement in Group:  {BHH ENGAGEMENT IN IONGE:95284}  Modes of Intervention:  {BHH MODES OF INTERVENTION:22269}  Additional Comments:  ***  Luis Miller 09/25/2022, 9:22 PM

## 2022-09-25 NOTE — Progress Notes (Signed)
Hemodialysis Note  Received patient in bed to unit. Alert and oriented. Informed consent signed and in chart.   Treatment initiated: 0921 Treatment completed:1101  Patient tolerated treatment well. Transported back to the room alert, without acute distress. Report given to patient's RN.  Access used:Left Arm AVG  Access issues: High arterial pressures, resulting in lower blood flow rate, early termination of treatment.   Total UF removed: None  Medications given:None  Post HD UJ:[id number]  Post HD weight:79.4 kg standing   Bartolo Darter, RN  [facility]

## 2022-09-25 NOTE — Plan of Care (Signed)
  Problem: Education: Goal: Knowledge of General Education information will improve Description: Including pain rating scale, medication(s)/side effects and non-pharmacologic comfort measures Outcome: Progressing   Problem: Nutrition: Goal: Adequate nutrition will be maintained Outcome: Progressing   Problem: Pain Managment: Goal: General experience of comfort will improve Outcome: Progressing   Problem: Safety: Goal: Ability to remain free from injury will improve Outcome: Progressing   Problem: Coping: Goal: Level of anxiety will decrease Outcome: Not Progressing   

## 2022-09-25 NOTE — Group Note (Signed)
Date:  09/25/2022 Time:  11:10 AM  Group Topic/Focus:  Self Care:   The focus of this group is to help patients understand the importance of self-care in order to improve or restore emotional, physical, spiritual, interpersonal, and financial health.    Participation Level:  Did Not Attend   Luis Miller [name] 09/25/2022, 11:10 AM  

## 2022-09-25 NOTE — Progress Notes (Signed)
Patient present in the dayroom for breakfast.  Sad, anxious affect.  Patient reports he feels "the same."  Remains anxious about pending discharge.  Denies SI,HI and AVH.  Denies pain. Compliant with scheduled medications.  15 min checks in place for safety.  Patient taken to dialysis around 0900.  I &Os recorded.    Patient returned from dialysis at lunch time.  Meal tray provided.   BP medication scheduled for 1800 held per pt request.  "I don't need it if my BP is low."   (BP was 11/71).

## 2022-09-25 NOTE — Group Note (Signed)
LCSW Group Therapy Note   Group Date: 09/25/2022 Start Time: 1300 End Time: 1345   Type of Therapy and Topic:  Group Therapy: Geriatric Depression  Participation Level:  Active    Summary of Patient Progress:   Patient attended group. Patient participated in listing events that lead to depression ie: not being able to do things you used to be able to, loss of independence. When moving to coping strategies patient stated; "Nothing works" and seemed to perservate on the thought pattern that nothing works. Attempts to get the patient to list one good/enjoyable activity resulted in him listing dancing, and riding his motorcycle...things he went on to say he can no longer do.   Marshell Levan, LCSWA 09/25/2022  2:16 PM

## 2022-09-25 NOTE — Group Note (Signed)
LCSW Group Therapy Note   Group Date: 09/25/2022 Start Time: 1300 End Time: 1345   Type of Therapy and Topic:  Group Therapy:   Participation Level:  Did Not Attend  Description of Group: Geriatric Depression    Therapeutic Modalities: Patient did not attend   Marshell Levan, LCSWA 09/25/2022  2:08 PM

## 2022-09-26 ENCOUNTER — Encounter (HOSPITAL_COMMUNITY): Payer: Self-pay

## 2022-09-26 ENCOUNTER — Ambulatory Visit: Admission: RE | Admit: 2022-09-26 | Payer: Medicare Other | Source: Ambulatory Visit

## 2022-09-26 ENCOUNTER — Inpatient Hospital Stay: Payer: Medicare Other

## 2022-09-26 ENCOUNTER — Inpatient Hospital Stay
Admission: EM | Admit: 2022-09-26 | Discharge: 2022-09-30 | DRG: 864 | Disposition: A | Discharge status: Other Acute Inpatient Hospital | Payer: Medicare Other | Source: Ambulatory Visit | Attending: Internal Medicine | Admitting: Internal Medicine

## 2022-09-26 ENCOUNTER — Other Ambulatory Visit: Payer: Self-pay | Admitting: Internal Medicine

## 2022-09-26 DIAGNOSIS — R509 Fever, unspecified: Principal | ICD-10-CM | POA: Diagnosis present

## 2022-09-26 DIAGNOSIS — E785 Hyperlipidemia, unspecified: Secondary | ICD-10-CM | POA: Diagnosis present

## 2022-09-26 DIAGNOSIS — I959 Hypotension, unspecified: Secondary | ICD-10-CM | POA: Diagnosis present

## 2022-09-26 DIAGNOSIS — F3130 Bipolar disorder, current episode depressed, mild or moderate severity, unspecified: Secondary | ICD-10-CM | POA: Diagnosis present

## 2022-09-26 DIAGNOSIS — Z886 Allergy status to analgesic agent status: Secondary | ICD-10-CM | POA: Diagnosis not present

## 2022-09-26 DIAGNOSIS — N2581 Secondary hyperparathyroidism of renal origin: Secondary | ICD-10-CM | POA: Diagnosis present

## 2022-09-26 DIAGNOSIS — I120 Hypertensive chronic kidney disease with stage 5 chronic kidney disease or end stage renal disease: Secondary | ICD-10-CM | POA: Diagnosis present

## 2022-09-26 DIAGNOSIS — Z8616 Personal history of COVID-19: Secondary | ICD-10-CM

## 2022-09-26 DIAGNOSIS — N186 End stage renal disease: Secondary | ICD-10-CM | POA: Diagnosis present

## 2022-09-26 DIAGNOSIS — E1129 Type 2 diabetes mellitus with other diabetic kidney complication: Secondary | ICD-10-CM | POA: Diagnosis present

## 2022-09-26 DIAGNOSIS — M109 Gout, unspecified: Secondary | ICD-10-CM | POA: Diagnosis present

## 2022-09-26 DIAGNOSIS — Z85820 Personal history of malignant melanoma of skin: Secondary | ICD-10-CM | POA: Diagnosis not present

## 2022-09-26 DIAGNOSIS — Z888 Allergy status to other drugs, medicaments and biological substances status: Secondary | ICD-10-CM

## 2022-09-26 DIAGNOSIS — W06XXXA Fall from bed, initial encounter: Secondary | ICD-10-CM | POA: Diagnosis present

## 2022-09-26 DIAGNOSIS — E232 Diabetes insipidus: Secondary | ICD-10-CM | POA: Diagnosis present

## 2022-09-26 DIAGNOSIS — K5900 Constipation, unspecified: Secondary | ICD-10-CM | POA: Diagnosis present

## 2022-09-26 DIAGNOSIS — N529 Male erectile dysfunction, unspecified: Secondary | ICD-10-CM | POA: Diagnosis present

## 2022-09-26 DIAGNOSIS — F3176 Bipolar disorder, in full remission, most recent episode depressed: Secondary | ICD-10-CM | POA: Diagnosis present

## 2022-09-26 DIAGNOSIS — F3289 Other specified depressive episodes: Secondary | ICD-10-CM | POA: Diagnosis not present

## 2022-09-26 DIAGNOSIS — Z8673 Personal history of transient ischemic attack (TIA), and cerebral infarction without residual deficits: Secondary | ICD-10-CM

## 2022-09-26 DIAGNOSIS — R4182 Altered mental status, unspecified: Secondary | ICD-10-CM | POA: Diagnosis present

## 2022-09-26 DIAGNOSIS — J984 Other disorders of lung: Secondary | ICD-10-CM | POA: Diagnosis not present

## 2022-09-26 DIAGNOSIS — Z992 Dependence on renal dialysis: Secondary | ICD-10-CM | POA: Diagnosis not present

## 2022-09-26 DIAGNOSIS — A419 Sepsis, unspecified organism: Secondary | ICD-10-CM | POA: Diagnosis not present

## 2022-09-26 DIAGNOSIS — R6510 Systemic inflammatory response syndrome (SIRS) of non-infectious origin without acute organ dysfunction: Secondary | ICD-10-CM | POA: Diagnosis present

## 2022-09-26 DIAGNOSIS — Z96652 Presence of left artificial knee joint: Secondary | ICD-10-CM | POA: Diagnosis present

## 2022-09-26 DIAGNOSIS — K5090 Crohn's disease, unspecified, without complications: Secondary | ICD-10-CM | POA: Diagnosis present

## 2022-09-26 DIAGNOSIS — R6520 Severe sepsis without septic shock: Secondary | ICD-10-CM | POA: Insufficient documentation

## 2022-09-26 DIAGNOSIS — R410 Disorientation, unspecified: Secondary | ICD-10-CM | POA: Diagnosis present

## 2022-09-26 DIAGNOSIS — R000 Tachycardia, unspecified: Secondary | ICD-10-CM | POA: Diagnosis present

## 2022-09-26 DIAGNOSIS — D631 Anemia in chronic kidney disease: Secondary | ICD-10-CM | POA: Diagnosis present

## 2022-09-26 DIAGNOSIS — Z974 Presence of external hearing-aid: Secondary | ICD-10-CM

## 2022-09-26 DIAGNOSIS — I10 Essential (primary) hypertension: Secondary | ICD-10-CM | POA: Diagnosis present

## 2022-09-26 DIAGNOSIS — R45851 Suicidal ideations: Secondary | ICD-10-CM | POA: Diagnosis present

## 2022-09-26 DIAGNOSIS — Z818 Family history of other mental and behavioral disorders: Secondary | ICD-10-CM | POA: Diagnosis not present

## 2022-09-26 DIAGNOSIS — Z87891 Personal history of nicotine dependence: Secondary | ICD-10-CM

## 2022-09-26 DIAGNOSIS — R059 Cough, unspecified: Secondary | ICD-10-CM | POA: Diagnosis not present

## 2022-09-26 DIAGNOSIS — Z79899 Other long term (current) drug therapy: Secondary | ICD-10-CM

## 2022-09-26 DIAGNOSIS — S2241XA Multiple fractures of ribs, right side, initial encounter for closed fracture: Secondary | ICD-10-CM | POA: Diagnosis not present

## 2022-09-26 DIAGNOSIS — E1122 Type 2 diabetes mellitus with diabetic chronic kidney disease: Secondary | ICD-10-CM | POA: Diagnosis present

## 2022-09-26 LAB — COMPREHENSIVE METABOLIC PANEL
ALT: 15 U/L (ref 0–44)
AST: 31 U/L (ref 15–41)
Albumin: 3.3 g/dL — ABNORMAL LOW (ref 3.5–5.0)
Alkaline Phosphatase: 99 U/L (ref 38–126)
Anion gap: 11 (ref 5–15)
BUN: 64 mg/dL — ABNORMAL HIGH (ref 8–23)
CO2: 26 mmol/L (ref 22–32)
Calcium: 8.8 mg/dL — ABNORMAL LOW (ref 8.9–10.3)
Chloride: 96 mmol/L — ABNORMAL LOW (ref 98–111)
Creatinine, Ser: 4.96 mg/dL — ABNORMAL HIGH (ref 0.61–1.24)
GFR, Estimated: 11 mL/min — ABNORMAL LOW (ref >60)
Glucose, Bld: 88 mg/dL (ref 70–99)
Potassium: 3.5 mmol/L (ref 3.5–5.1)
Sodium: 133 mmol/L — ABNORMAL LOW (ref 135–145)
Total Bilirubin: 0.7 mg/dL (ref 0.3–1.2)
Total Protein: 6.1 g/dL — ABNORMAL LOW (ref 6.5–8.1)

## 2022-09-26 LAB — CBC WITH DIFFERENTIAL/PLATELET
Abs Immature Granulocytes: 0.01 K/uL (ref 0.00–0.07)
Basophils Absolute: 0 K/uL (ref 0.0–0.1)
Basophils Relative: 0 %
Eosinophils Absolute: 0.1 K/uL (ref 0.0–0.5)
Eosinophils Relative: 3 %
HCT: 26.4 % — ABNORMAL LOW (ref 39.0–52.0)
Hemoglobin: 9.2 g/dL — ABNORMAL LOW (ref 13.0–17.0)
Immature Granulocytes: 0 %
Lymphocytes Relative: 8 %
Lymphs Abs: 0.5 K/uL — ABNORMAL LOW (ref 0.7–4.0)
MCH: 36.1 pg — ABNORMAL HIGH (ref 26.0–34.0)
MCHC: 34.8 g/dL (ref 30.0–36.0)
MCV: 103.5 fL — ABNORMAL HIGH (ref 80.0–100.0)
Monocytes Absolute: 0.6 K/uL (ref 0.1–1.0)
Monocytes Relative: 10 %
Neutro Abs: 4.2 K/uL (ref 1.7–7.7)
Neutrophils Relative %: 79 %
Platelets: 114 K/uL — ABNORMAL LOW (ref 150–400)
RBC: 2.55 MIL/uL — ABNORMAL LOW (ref 4.22–5.81)
RDW: 14.6 % (ref 11.5–15.5)
WBC: 5.3 K/uL (ref 4.0–10.5)
nRBC: 0 % (ref 0.0–0.2)

## 2022-09-26 LAB — LACTIC ACID, PLASMA
Lactic Acid, Venous: 1 mmol/L (ref 0.5–1.9)
Lactic Acid, Venous: 0.7 mmol/L (ref 0.5–1.9)

## 2022-09-26 LAB — PROTIME-INR
INR: 1.2 (ref 0.8–1.2)
Prothrombin Time: 15.1 seconds (ref 11.4–15.2)

## 2022-09-26 LAB — CORTISOL-AM, BLOOD: Cortisol - AM: 10.8 ug/dL (ref 6.7–22.6)

## 2022-09-26 LAB — PROCALCITONIN: Procalcitonin: 0.3 ng/mL

## 2022-09-26 LAB — APTT: aPTT: 27 seconds (ref 24–36)

## 2022-09-26 LAB — GLUCOSE, CAPILLARY: Glucose-Capillary: 114 mg/dL — ABNORMAL HIGH (ref 70–99)

## 2022-09-26 MED ORDER — HEPARIN SODIUM (PORCINE) 5000 UNIT/ML IJ SOLN
5000.0000 [IU] | Freq: Three times a day (TID) | INTRAMUSCULAR | Status: DC
  Administered 2022-09-26 – 2022-09-30 (×10): 5000 [IU] via SUBCUTANEOUS
  Filled 2022-09-26 (×10): qty 1

## 2022-09-26 MED ORDER — VANCOMYCIN HCL 1750 MG/350ML IV SOLN
1750.0000 mg | Freq: Once | INTRAVENOUS | Status: DC
  Filled 2022-09-26: qty 350

## 2022-09-26 MED ORDER — METRONIDAZOLE 500 MG/100ML IV SOLN
500.0000 mg | Freq: Two times a day (BID) | INTRAVENOUS | Status: DC
  Administered 2022-09-26: 500 mg via INTRAVENOUS
  Filled 2022-09-26: qty 100

## 2022-09-26 MED ORDER — VANCOMYCIN HCL 1750 MG/350ML IV SOLN
1750.0000 mg | Freq: Once | INTRAVENOUS | Status: AC
  Administered 2022-09-26: 1750 mg via INTRAVENOUS
  Filled 2022-09-26: qty 350

## 2022-09-26 MED ORDER — LACTATED RINGERS IV SOLN
150.0000 mL/h | INTRAVENOUS | Status: DC
  Administered 2022-09-26: 150 mL/h via INTRAVENOUS

## 2022-09-26 MED ORDER — ENOXAPARIN SODIUM 40 MG/0.4ML IJ SOSY: 40.0000 mg | PREFILLED_SYRINGE | INTRAMUSCULAR | Status: DC

## 2022-09-26 MED ORDER — SODIUM CHLORIDE 0.9 % IV SOLN
1.0000 g | Freq: Once | INTRAVENOUS | Status: DC
  Filled 2022-09-26: qty 10

## 2022-09-26 MED ORDER — VANCOMYCIN HCL 750 MG/150ML IV SOLN: 750.0000 mg | INTRAVENOUS | Status: DC

## 2022-09-26 MED ORDER — VANCOMYCIN HCL 750 MG/150ML IV SOLN
750.0000 mg | INTRAVENOUS | Status: DC
  Filled 2022-09-26: qty 150

## 2022-09-26 MED ORDER — ONDANSETRON HCL 4 MG/2ML IJ SOLN: 4.0000 mg | Freq: Four times a day (QID) | INTRAMUSCULAR | Status: DC | PRN

## 2022-09-26 MED ORDER — LACTATED RINGERS IV BOLUS (SEPSIS)
1000.0000 mL | Freq: Once | INTRAVENOUS | Status: AC
  Administered 2022-09-26: 1000 mL via INTRAVENOUS

## 2022-09-26 MED ORDER — ACETAMINOPHEN 325 MG PO TABS: 650.0000 mg | ORAL_TABLET | Freq: Four times a day (QID) | ORAL | Status: DC | PRN

## 2022-09-26 MED ORDER — LACTATED RINGERS IV SOLN: INTRAVENOUS | Status: DC

## 2022-09-26 MED ORDER — LACTATED RINGERS IV BOLUS (SEPSIS): 1000.0000 mL | Freq: Once | INTRAVENOUS | Status: DC

## 2022-09-26 MED ORDER — LACTATED RINGERS IV SOLN: 150.0000 mL/h | INTRAVENOUS | Status: DC

## 2022-09-26 MED ORDER — ONDANSETRON HCL 4 MG PO TABS: 4.0000 mg | ORAL_TABLET | Freq: Four times a day (QID) | ORAL | Status: DC | PRN

## 2022-09-26 MED ORDER — ACETAMINOPHEN 650 MG RE SUPP: 650.0000 mg | Freq: Four times a day (QID) | RECTAL | Status: DC | PRN

## 2022-09-26 MED ORDER — SODIUM CHLORIDE 0.9 % IV SOLN
1.0000 g | INTRAVENOUS | Status: DC
  Administered 2022-09-27: 1 g via INTRAVENOUS
  Filled 2022-09-26 (×2): qty 10

## 2022-09-26 NOTE — Progress Notes (Signed)
Rapid Response Event Note   Reason for Call : Altered mental status and elevated blood pressure   Initial Focused Assessment:  Lying in bed with staff surrounding him, answering questions appropriately, No weakness or or asymmetrical facial features. Responding appropriately to orientation questions. Pulse is elevated at 116 and temperature is elevated at 101.2. Patient states he was trying to fix his blankets on his bed and slipped and bumped his R knee.     Interventions:  Asked bedside nurse to administer prescribed tylenol and to reach out to provider regarding fall.   Plan of Care:  Keep temperature below 100.4 Possible xray of knee if provider deems necessary.    Event Summary: Fall from bed, vital signs wnl except pulse and temperature. Small skin abrasion to R knee.   MD Notified:  Call Time:0256 Arrival Time:0300 End Time:0310  Barnie Alderman, RN

## 2022-09-26 NOTE — Assessment & Plan Note (Signed)
Sliding scale insulin coverage 

## 2022-09-26 NOTE — Assessment & Plan Note (Signed)
Suspecting sepsis so recommend transfer to medical unit for further workup as patient unable to get IV fluids on psych unit Sepsis criteria includes fever, tachycardia, hypotension, altered mental status Code sepsis ordered Initial fluid bolus Bed control called to secure bed on MedSurg telemetry
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
## 2022-09-26 NOTE — Progress Notes (Signed)
Responded to rapid response. Saturation 100% on room air. No respiratory services needed, therefore, excused from patient's room

## 2022-09-26 NOTE — Progress Notes (Addendum)
Called by staff states found patient lying across the foot of the bed. Pt reports he was trying to fix blankets. Pt is disorganized and confused. Fine tremors noted. Diaphoretic. V/s T102.5-19-135-152/80. Pulse Ox 99% on RA. Blood sugar 114. Scratches to right knee. No redness, swelling and non-tender to touch. Rapid response called. AC at bedside give tylenol and  treat as a fall. Dixon, NP down the unit to access patient.  N.O. xray right knee and place on 1:1. Consult hospitalist. [name], son called no answer. Message left of voice machine. No apparent injuries noted. No c/o pain/discomfort noted. Plan of care continue.  0400 Pt transfer to medical floor per Dr. Jaynee Eagles.

## 2022-09-26 NOTE — Progress Notes (Signed)
[facility]  Dialysis Note   Subjective:   Patient seen laying in bed Alert CPAP in place  Continues to state he feels he isn't ready for discharge.   Objective:  Vital signs in last 24 hours:  Temp:  [97.4 F (36.3 C)-102.5 F (39.2 C)] 98 F (36.7 C) (07/07 0842) Pulse Rate:  [70-135] 81 (07/07 0842) Resp:  [15-20] 20 (07/07 0842) BP: (93-152)/(35-80) 123/37 (07/07 0842) SpO2:  [96 %-100 %] 97 % (07/07 0842) Weight:  [79.4 kg] 79.4 kg (07/06 1135)  Weight change:  There were no vitals filed for this visit.   Intake/Output: No intake/output data recorded.   Intake/Output this shift:  No intake/output data recorded.  Physical Exam: General: NAD, flat affect  Head: Normocephalic, atraumatic. Moist oral mucosal membranes  Eyes: Anicteric  Lungs:  Clear to auscultation, Cpap   Heart: Regular rate and rhythm  Abdomen:  Soft, nontender  Extremities: No peripheral edema.  Neurologic: Alert, moving all four extremities  Skin: No lesions  Access: Lt AVF    Basic Metabolic Panel: Recent Labs  Lab 09/21/22 1419 09/24/22 0837 09/26/22 0603  NA 138 138 133*  K 4.4 3.9 3.5  CL 102 103 96*  CO2 24 23 26   GLUCOSE 171* 105* 88  BUN 80* 77* 64*  CREATININE 6.24* 5.94* 4.96*  CALCIUM 8.9 9.0 8.8*  PHOS 4.3 4.4  --      Liver Function Tests: Recent Labs  Lab 09/21/22 1419 09/24/22 0837 09/26/22 0603  AST  --   --  31  ALT  --   --  15  ALKPHOS  --   --  99  BILITOT  --   --  0.7  PROT  --   --  6.1*  ALBUMIN 3.9 3.6 3.3*    No results for input(s): "LIPASE", "AMYLASE" in the last 168 hours. No results for input(s): "AMMONIA" in the last 168 hours.  CBC: Recent Labs  Lab 09/20/22 0922 09/21/22 1538 09/23/22 0958 09/24/22 0837 09/26/22 0603  WBC 4.5 3.5* 5.4 4.7 5.3  NEUTROABS  --   --   --   --  4.2  HGB 10.1* 10.0* 11.0* 9.7* 9.2*  HCT 29.7* 29.2* 32.1* 28.4* 26.4*  MCV 104.6* 103.9* 103.5* 103.6* 103.5*  PLT 128* 134* 135* 117*  114*     Cardiac Enzymes: No results for input(s): "CKTOTAL", "CKMB", "CKMBINDEX", "TROPONINI" in the last 168 hours.  BNP: Invalid input(s): "POCBNP"  CBG: Recent Labs  Lab 09/26/22 0257  GLUCAP 114*    Microbiology: Results for orders placed or performed in visit on 05/12/22  Microscopic Examination     Status: Abnormal   Collection Time: 05/12/22 10:39 AM   Urine  Result Value Ref Range Status   WBC, UA 6-10 (A) 0 - 5 /hpf Final   RBC, Urine 0-2 0 - 2 /hpf Final   Epithelial Cells (non renal) 0-10 0 - 10 /hpf Final   Bacteria, UA Moderate (A) None seen/Few Final    Coagulation Studies: Recent Labs    09/26/22 0603  LABPROT 15.1  INR 1.2    Urinalysis: No results for input(s): "COLORURINE", "LABSPEC", "PHURINE", "GLUCOSEU", "HGBUR", "BILIRUBINUR", "KETONESUR", "PROTEINUR", "UROBILINOGEN", "NITRITE", "LEUKOCYTESUR" in the last 72 hours.  Invalid input(s): "APPERANCEUR"    Imaging: DG Knee 1-2 Views Right  Result Date: 09/26/2022 CLINICAL DATA:  Status post fall.  Right knee pain. EXAM: RIGHT KNEE - 1-2 VIEW COMPARISON:  None Available. FINDINGS: Trace suprapatellar joint effusion. No  acute fracture or dislocation. Patellofemoral and medial compartment joint space narrowing and marginal spur formation with sharpening of the tibial spines. Soft tissues scratch there is mild medial soft tissue edema. IMPRESSION: 1. Trace suprapatellar joint effusion. No acute fracture or dislocation. 2. Mild medial soft tissue edema. 3. Patellofemoral and medial compartment osteoarthritis. Electronically Signed   By: Signa Kell M.D.   On: 09/26/2022 06:12     Medications:    [START ON 09/27/2022] ceFEPime (MAXIPIME) IV     ceFEPime (MAXIPIME) IV     lactated ringers 50 mL/hr at 09/26/22 0657   metronidazole 500 mg (09/26/22 0653)   vancomycin     [START ON 09/28/2022] vancomycin      heparin  5,000 Units Subcutaneous Q8H   acetaminophen **OR** acetaminophen, ondansetron **OR**  ondansetron (ZOFRAN) IV  Assessment/ Plan:  Luis Miller. is a 77 y.o.  male white male with end stage renal disease on hemodialysis, hypertension, gout, depression, history of melanoma, crohn's, and CVA who presents to [facility] on 07/30/2022 for Bipolar depression (HCC) [F31.9]   Principal Problem:   Bipolar depression (HCC) End-stage renal disease.  **Due to extended admission, patient will need to be re-established with outpatient clinic prior to discharge. Please contact renal navigator for discharge updates**   End Stage Renal Disease on hemodialysis:  Next treatment scheduled for Tuesday   Potassium [facility] vascular lab)  Date Value Ref Range Status  09/28/2021 3.6 3.5 - 5.1 mmol/L Final    Comment:    Performed at [facility], [address] [place], [place] [address]   No intake or output data in the 24 hours ending 09/26/22 0929   2. Hypertension with chronic kidney disease: Maintain the patient on benazepril at this time. BP (!) 123/37 (BP Location: Right Leg)   Pulse 81   Temp 98 F (36.7 C) (Oral)   Resp 20   SpO2 97%   Blood pressure 123/37  3. Anemia of chronic kidney disease/ kidney injury/chronic disease/acute blood loss:   Lab Results  Component Value Date   HGB 9.2 (L) 09/26/2022  Hgb at goal Continue Epogen 4000 units IV with dialysis.   4. Secondary Hyperparathyroidism:     Lab Results  Component Value Date   CALCIUM 8.8 (L) 09/26/2022   CAION 1.17 11/05/2021   PHOS 4.4 09/24/2022   Calcium and phosphorus at desired levels.     LOS: 0 [facility] 7/7/20249:29 AM

## 2022-09-26 NOTE — Assessment & Plan Note (Signed)
Received dialysis earlier in the day on 09/25/2022

## 2022-09-26 NOTE — Progress Notes (Signed)
This provider received a call at 306am because a rapid was called due to patient appearing diaphoretic, disoriented and was found on the floor.    The nurse supervisor suggested the patient be given tylenol and to follow fall protocol.  However, the patient vitals were still unstable and patient still appeared "weak" with  a recorded temperature of 102.5  taken at 256 am accompanied by a HR of 132, and a BP which had fallen from 111/80 to 111/42 within minutes.   This provider put in a stat hospitalist request.  The patient was seen and assessed by Dr. Jaynee Eagles and "code sepsis" was initiated.  Patient was discharged and transferred to the medical unit room 120.

## 2022-09-26 NOTE — Progress Notes (Signed)
Patient admitted early hours of the morning after he was found on the floor for psych nurse. He had fever 102.3. During my evaluation sitter present in the room. He is eating breakfast. Afebrile. Denies any complaints. Patient got dialysis yesterday. Currently on IV vancomycin and cefepime. No cough shortness of breath or respiratory distress.  No source of infection noted at present. Continue to monitor and narrow down antibiotics depending on clinical course.  No charge
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
## 2022-09-26 NOTE — Progress Notes (Addendum)
Pt transferred to the medical unit, room 120 for further care. Pt remains disorganized and confused. All personal belongings and CPAP given to patient. 1:1 at the bedside. NP on site.

## 2022-09-26 NOTE — Assessment & Plan Note (Signed)
Patient with suicidal ideation, per discussion with psych nurse practitioner Baxter Flattery Will need one-to-one suicide precautions on medical floor Psych consult for continued treatment

## 2022-09-26 NOTE — Progress Notes (Signed)
Pharmacy Antibiotic Note  Luis Speno. is a 77 y.o. male admitted on 09/26/2022 with sepsis.  Pharmacy has been consulted for Vancomycin, Cefepime dosing.  Pt on HD every T-Th-Sat PTA, may need to confirm HD schedule while inpatient.   Plan: Cefepime 1 gm IV X 1 ordered for 7/7 @ ~ 0600. Cefepime 1 gm IV Q24H ordered to start 7/8 @ 1800 (to be given following HD session on dialysis days).  Vancomycin 1750 IV X 1 ordered for 7/7 @ 0600. Vancomycin 750 mg IV Q T-Th-Sat - HD ordered to start 7/9.     Temp (24hrs), Avg:99.2 F (37.3 C), Min:97.4 F (36.3 C), Max:102.5 F (39.2 C)  Recent Labs  Lab 09/20/22 0922 09/21/22 1419 09/21/22 1538 09/23/22 0958 09/24/22 0837  WBC 4.5  --  3.5* 5.4 4.7  CREATININE  --  6.24*  --   --  5.94*    Estimated Creatinine Clearance: 10.3 mL/min (A) (by C-G formula based on SCr of 5.94 mg/dL (H)).    Allergies  Allergen Reactions   Mirtazapine Rash   Aspirin     Other reaction(s): Unknown Patient was instructed not to take aspirin but can not remember why   Atomoxetine Other (See Comments)    Urinary sx   Strattera [Atomoxetine Hcl] Other (See Comments)    Urinary sx   Xyzal [Levocetirizine Dihydrochloride] Rash    Patient reported on 11/02/17    Antimicrobials this admission:   >>    >>   Dose adjustments this admission:   Microbiology results:  BCx:   UCx:    Sputum:    MRSA PCR:   Thank you for allowing pharmacy to be a part of this patient's care.  Geoffrey Hynes D 09/26/2022 5:54 AM
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
## 2022-09-26 NOTE — Sepsis Progress Note (Signed)
Elink following code sepsis °

## 2022-09-27 ENCOUNTER — Inpatient Hospital Stay: Payer: Medicare Other

## 2022-09-27 MED ORDER — LAMOTRIGINE 25 MG PO TABS
25.0000 mg | ORAL_TABLET | ORAL | Status: DC
  Administered 2022-09-27 – 2022-09-30 (×5): 25 mg via ORAL
  Filled 2022-09-27 (×8): qty 1

## 2022-09-27 MED ORDER — OLANZAPINE 10 MG PO TABS
10.0000 mg | ORAL_TABLET | Freq: Every day | ORAL | Status: DC
  Administered 2022-09-27 – 2022-09-29 (×3): 10 mg via ORAL
  Filled 2022-09-27 (×3): qty 1

## 2022-09-27 MED ORDER — TRAZODONE HCL 50 MG PO TABS
100.0000 mg | ORAL_TABLET | Freq: Every evening | ORAL | Status: DC | PRN
  Administered 2022-09-27 – 2022-09-30 (×3): 100 mg via ORAL
  Filled 2022-09-27 (×3): qty 2

## 2022-09-27 MED ORDER — DOXEPIN HCL 100 MG PO CAPS
100.0000 mg | ORAL_CAPSULE | Freq: Every day | ORAL | Status: DC
  Administered 2022-09-27 – 2022-09-29 (×3): 100 mg via ORAL
  Filled 2022-09-27 (×3): qty 1

## 2022-09-27 MED ORDER — SENNOSIDES-DOCUSATE SODIUM 8.6-50 MG PO TABS: 1.0000 | ORAL_TABLET | Freq: Every evening | ORAL | Status: DC | PRN

## 2022-09-27 MED ORDER — POLYETHYLENE GLYCOL 3350 17 G PO PACK
17.0000 g | PACK | Freq: Every day | ORAL | Status: DC
  Administered 2022-09-27 – 2022-09-30 (×3): 17 g via ORAL
  Filled 2022-09-27 (×4): qty 1

## 2022-09-27 MED ORDER — BUPROPION HCL ER (XL) 150 MG PO TB24
150.0000 mg | ORAL_TABLET | Freq: Every day | ORAL | Status: DC
  Administered 2022-09-27 – 2022-09-30 (×4): 150 mg via ORAL
  Filled 2022-09-27 (×4): qty 1

## 2022-09-27 NOTE — Progress Notes (Addendum)
Progress Note    [name].  ZOX:[id number] DOB: [birth year]  DOA: 09/26/2022 PCP: Larae Grooms, NP      Brief Narrative:    Medical records reviewed and are as summarized below:  [name]. is a 77 y.o. male with medical history significant for bipolar disorder, ESRD on HD, type II DM, hypertension, history of melanoma, gout, Crohn's disease, history of stroke who initially presented to the hospital on 07/30/2022 because of depression.  He has been on admission at the [facility] psych unit since then for depression and suicidal ideation and was awaiting placement to a mental health facility.  Hospitalist team was consulted on 09/26/2022 because of abnormal vital signs (fever of 102.5 F, heart rate of 135 and blood pressure 152/88 and subsequently trending down to 111/49).  Rapid response was called as a result of abnormal vital signs.  He was admitted to the medical floor for sepsis of unknown etiology.  He was treated with IV fluids and empiric IV antibiotics.      Assessment/Plan:   Principal Problem:   Sepsis (HCC) Active Problems:   Hypertension   Bipolar disorder, in full remission, most recent episode depressed (HCC)   Type 2 diabetes mellitus with other diabetic kidney complication (HCC)    There is no height or weight on file to calculate BMI.   Suspected sepsis: Unknown etiology.  Obtain chest x-ray for further evaluation.  Continue empiric IV antibiotics.  Follow-up blood cultures.   ESRD on HD, secondary hyperparathyroidism: Plan for hemodialysis tomorrow.   Type II DM: Glucose levels are okay.  Continue to monitor   Bipolar disorder, depression with suicidal ideation: Patient is on one-to-one watch.  Consulted psychiatrist to address psychotropic medications.     History of stroke  Diet Order             Diet Heart Room service appropriate? Yes; Fluid consistency: Thin  Diet effective now                             Consultants: Psychiatrist  Procedures: None    Medications:    buPROPion  150 mg Oral Daily   doxepin  100 mg Oral QHS   heparin  5,000 Units Subcutaneous Q8H   lamoTRIgine  25 mg Oral [id number]   OLANZapine  10 mg Oral QHS   polyethylene glycol  17 g Oral Daily   Continuous Infusions:  ceFEPime (MAXIPIME) IV     [START ON 09/28/2022] vancomycin       Anti-infectives (From admission, onward)    Start     Dose/Rate Route Frequency Ordered Stop   09/28/22 1200  vancomycin (VANCOREADY) IVPB 750 mg/150 mL  Status:  Discontinued        750 mg 150 mL/hr over 60 Minutes Intravenous Every T-Th-Sa (Hemodialysis) 09/26/22 0553 09/26/22 0946   09/28/22 1200  vancomycin (VANCOREADY) IVPB 750 mg/150 mL        750 mg 150 mL/hr over 60 Minutes Intravenous Every T-Th-Sa (Hemodialysis) 09/26/22 0949     09/27/22 1800  ceFEPIme (MAXIPIME) 1 g in sodium chloride 0.9 % 100 mL IVPB        1 g 200 mL/hr over 30 Minutes Intravenous Every 24 hours 09/26/22 0547     09/26/22 1045  vancomycin (VANCOREADY) IVPB 1750 mg/350 mL        1,750 mg 175 mL/hr over 120 Minutes Intravenous  Once 09/26/22  4098 09/26/22 1152   09/26/22 1000  vancomycin (VANCOREADY) IVPB 1750 mg/350 mL  Status:  Discontinued        1,750 mg 175 mL/hr over 120 Minutes Intravenous  Once 09/26/22 0852 09/26/22 0946   09/26/22 1000  ceFEPIme (MAXIPIME) 1 g in sodium chloride 0.9 % 100 mL IVPB  Status:  Discontinued        1 g 200 mL/hr over 30 Minutes Intravenous  Once 09/26/22 0853 09/26/22 0946   09/26/22 0645  vancomycin (VANCOREADY) IVPB 1750 mg/350 mL  Status:  Discontinued        1,750 mg 175 mL/hr over 120 Minutes Intravenous  Once 09/26/22 0546 09/26/22 0852   09/26/22 0645  ceFEPIme (MAXIPIME) 1 g in sodium chloride 0.9 % 100 mL IVPB  Status:  Discontinued        1 g 200 mL/hr over 30 Minutes Intravenous  Once 09/26/22 0546 09/26/22 0853   09/26/22 0630  metroNIDAZOLE (FLAGYL) IVPB 500 mg  Status:   Discontinued        500 mg 100 mL/hr over 60 Minutes Intravenous Every 12 hours 09/26/22 0541 09/26/22 0946              Family Communication/Anticipated D/C date and plan/Code Status   DVT prophylaxis: heparin injection 5,000 Units Start: 09/26/22 0630     Code Status: Full Code  Family Communication: None Disposition Plan: Plan to discharge to behavioral health unit when medically stable   Status is: Inpatient Remains inpatient appropriate because: On IV antibiotics for sepsis       Subjective:   Interval events noted.  He complains of constipation and cough.  No shortness of breath, chest pain, dysuria, hematuria or increased frequency of micturition.  He said he still depressed feels somewhat suicidal but he does not have a plan.  He has a Comptroller at the bedside.  Objective:    Vitals:   09/26/22 1343 09/26/22 2051 09/27/22 0526 09/27/22 0845  BP: 135/60 (!) 155/59 138/65 (!) 147/62  Pulse: 82 78 79 80  Resp: 18 20 20 18   Temp: 97.9 F (36.6 C) 98.6 F (37 C) 97.8 F (36.6 C) 98 F (36.7 C)  TempSrc:   Axillary   SpO2: 95% 96% 99% 96%   No data found.   Intake/Output Summary (Last 24 hours) at 09/27/2022 1326 Last data filed at 09/26/2022 1745 Gross per 24 hour  Intake 1623.33 ml  Output 450 ml  Net 1173.33 ml   There were no vitals filed for this visit.  Exam:  GEN: NAD SKIN: Warm and dry EYES: No pallor or icterus ENT: MMM CV: RRR PULM: CTA B ABD: soft, ND, NT, +BS CNS: AAO x 3, non focal EXT: No edema or tenderness PSYCH: Depressed mood.  Calm and cooperative        Data Reviewed:   I have personally reviewed following labs and imaging studies:  Labs: Labs show the following:   Basic Metabolic Panel: Recent Labs  Lab 09/21/22 1419 09/24/22 0837 09/26/22 0603  NA 138 138 133*  K 4.4 3.9 3.5  CL 102 103 96*  CO2 24 23 26   GLUCOSE 171* 105* 88  BUN 80* 77* 64*  CREATININE 6.24* 5.94* 4.96*  CALCIUM 8.9 9.0 8.8*   PHOS 4.3 4.4  --    GFR Estimated Creatinine Clearance: 12.3 mL/min (A) (by C-G formula based on SCr of 4.96 mg/dL (H)). Liver Function Tests: Recent Labs  Lab 09/21/22 1419 09/24/22 1191 09/26/22 4782  AST  --   --  31  ALT  --   --  15  ALKPHOS  --   --  99  BILITOT  --   --  0.7  PROT  --   --  6.1*  ALBUMIN 3.9 3.6 3.3*   No results for input(s): "LIPASE", "AMYLASE" in the last 168 hours. No results for input(s): "AMMONIA" in the last 168 hours. Coagulation profile Recent Labs  Lab 09/26/22 0603  INR 1.2    CBC: Recent Labs  Lab 09/21/22 1538 09/23/22 0958 09/24/22 0837 09/26/22 0603  WBC 3.5* 5.4 4.7 5.3  NEUTROABS  --   --   --  4.2  HGB 10.0* 11.0* 9.7* 9.2*  HCT 29.2* 32.1* 28.4* 26.4*  MCV 103.9* 103.5* 103.6* 103.5*  PLT 134* 135* 117* 114*   Cardiac Enzymes: No results for input(s): "CKTOTAL", "CKMB", "CKMBINDEX", "TROPONINI" in the last 168 hours. BNP (last 3 results) No results for input(s): "PROBNP" in the last 8760 hours. CBG: Recent Labs  Lab 09/26/22 0257  GLUCAP 114*   D-Dimer: No results for input(s): "DDIMER" in the last 72 hours. Hgb A1c: No results for input(s): "HGBA1C" in the last 72 hours. Lipid Profile: No results for input(s): "CHOL", "HDL", "LDLCALC", "TRIG", "CHOLHDL", "LDLDIRECT" in the last 72 hours. Thyroid function studies: No results for input(s): "TSH", "T4TOTAL", "T3FREE", "THYROIDAB" in the last 72 hours.  Invalid input(s): "FREET3" Anemia work up: No results for input(s): "VITAMINB12", "FOLATE", "FERRITIN", "TIBC", "IRON", "RETICCTPCT" in the last 72 hours. Sepsis Labs: Recent Labs  Lab 09/21/22 1538 09/23/22 0958 09/24/22 0837 09/26/22 0603 09/26/22 0736  PROCALCITON  --   --   --  0.30  --   WBC 3.5* 5.4 4.7 5.3  --   LATICACIDVEN  --   --   --  1.0 0.7    Microbiology Recent Results (from the past 240 hour(s))  Culture, blood (x 2)     Status: None (Preliminary result)   Collection Time: 09/26/22   6:03 AM   Specimen: BLOOD RIGHT ARM  Result Value Ref Range Status   Specimen Description BLOOD RIGHT ARM  Final   Special Requests   Final    BOTTLES DRAWN AEROBIC AND ANAEROBIC Blood Culture adequate volume   Culture   Final    NO GROWTH < 24 HOURS Performed at [facility], [address], [place], [place] [address]    Report Status PENDING  Incomplete  Culture, blood (x 2)     Status: None (Preliminary result)   Collection Time: 09/26/22 11:36 AM   Specimen: BLOOD  Result Value Ref Range Status   Specimen Description BLOOD RIGHT ANTECUBITAL  Final   Special Requests   Final    BOTTLES DRAWN AEROBIC AND ANAEROBIC Blood Culture adequate volume   Culture   Final    NO GROWTH < 24 HOURS Performed at [facility], [address] [place], [place] [address]    Report Status PENDING  Incomplete    Procedures and diagnostic studies:  DG Knee 1-2 Views Right  Result Date: 09/26/2022 CLINICAL DATA:  Status post fall.  Right knee pain. EXAM: RIGHT KNEE - 1-2 VIEW COMPARISON:  None Available. FINDINGS: Trace suprapatellar joint effusion. No acute fracture or dislocation. Patellofemoral and medial compartment joint space narrowing and marginal spur formation with sharpening of the tibial spines. Soft tissues scratch there is mild medial soft tissue edema. IMPRESSION: 1. Trace suprapatellar joint effusion. No acute fracture or dislocation. 2.  Mild medial soft tissue edema. 3. Patellofemoral and medial compartment osteoarthritis. Electronically Signed   By: Signa Kell M.D.   On: 09/26/2022 06:12               LOS: 1 day   Cameran Pettey  [facility]   Pager on [email or web address]. If 7PM-7AM, please contact night-coverage at [email or web address]     09/27/2022, 1:26 PM

## 2022-09-28 ENCOUNTER — Encounter: Payer: Self-pay | Admitting: Internal Medicine

## 2022-09-28 LAB — VITAMIN B12: Vitamin B-12: 273 pg/mL (ref 180–914)

## 2022-09-28 MED ORDER — BENAZEPRIL HCL 20 MG PO TABS
40.0000 mg | ORAL_TABLET | Freq: Every day | ORAL | Status: DC
  Administered 2022-09-29 – 2022-09-30 (×2): 40 mg via ORAL
  Filled 2022-09-28 (×3): qty 2

## 2022-09-28 MED ORDER — AMLODIPINE BESYLATE 10 MG PO TABS
10.0000 mg | ORAL_TABLET | Freq: Every day | ORAL | Status: DC
  Administered 2022-09-29 – 2022-09-30 (×2): 10 mg via ORAL
  Filled 2022-09-28 (×2): qty 1

## 2022-09-28 MED ORDER — HYDRALAZINE HCL 50 MG PO TABS
100.0000 mg | ORAL_TABLET | Freq: Three times a day (TID) | ORAL | Status: DC
  Administered 2022-09-28 – 2022-09-29 (×4): 100 mg via ORAL
  Filled 2022-09-28 (×4): qty 2

## 2022-09-28 MED ORDER — ROSUVASTATIN CALCIUM 10 MG PO TABS
10.0000 mg | ORAL_TABLET | Freq: Every day | ORAL | Status: DC
  Administered 2022-09-28 – 2022-09-30 (×3): 10 mg via ORAL
  Filled 2022-09-28 (×3): qty 1

## 2022-09-28 MED ORDER — AMOXICILLIN-POT CLAVULANATE 500-125 MG PO TABS
1.0000 | ORAL_TABLET | Freq: Two times a day (BID) | ORAL | Status: DC
  Administered 2022-09-28 – 2022-09-30 (×5): 1 via ORAL
  Filled 2022-09-28 (×6): qty 1

## 2022-09-28 MED ORDER — CALCITRIOL 0.25 MCG PO CAPS
0.2500 ug | ORAL_CAPSULE | Freq: Every day | ORAL | Status: DC
  Administered 2022-09-29 – 2022-09-30 (×2): 0.25 ug via ORAL
  Filled 2022-09-28 (×2): qty 1

## 2022-09-28 NOTE — Progress Notes (Addendum)
[facility]  Dialysis Note   Subjective:   Patient seen and evaluated during dialysis   HEMODIALYSIS FLOWSHEET:  Blood Flow Rate (mL/min): 400 mL/min Arterial Pressure (mmHg): -200 mmHg Venous Pressure (mmHg): 240 mmHg TMP (mmHg): 13 mmHg Ultrafiltration Rate (mL/min): 686 mL/min Dialysate Flow Rate (mL/min): 300 ml/min Dialysis Fluid Bolus: Normal Saline  Afebrile Safety sitter at bedside  Objective:  Vital signs in last 24 hours:  Temp:  [97.5 F (36.4 C)-98.3 F (36.8 C)] 98 F (36.7 C) (07/09 1130) Pulse Rate:  [66-97] 77 (07/09 1600) Resp:  [16-19] 16 (07/09 1600) BP: (104-192)/(61-90) 148/61 (07/09 1600) SpO2:  [96 %-100 %] 98 % (07/09 1600)  Weight change:  There were no vitals filed for this visit.   Intake/Output: I/O last 3 completed shifts: In: 100 [IV Piggyback:100] Out: -    Intake/Output this shift:  No intake/output data recorded.  Physical Exam: General: NAD, flat affect  Head: Normocephalic, atraumatic. Moist oral mucosal membranes  Eyes: Anicteric  Lungs:  Clear to auscultation, Cpap   Heart: Regular rate and rhythm  Abdomen:  Soft, nontender  Extremities: No peripheral edema.  Neurologic: Alert, moving all four extremities  Skin: No lesions  Access: Lt AVF    Basic Metabolic Panel: Recent Labs  Lab 09/24/22 0837 09/26/22 0603  NA 138 133*  K 3.9 3.5  CL 103 96*  CO2 23 26  GLUCOSE 105* 88  BUN 77* 64*  CREATININE 5.94* 4.96*  CALCIUM 9.0 8.8*  PHOS 4.4  --      Liver Function Tests: Recent Labs  Lab 09/24/22 0837 09/26/22 0603  AST  --  31  ALT  --  15  ALKPHOS  --  99  BILITOT  --  0.7  PROT  --  6.1*  ALBUMIN 3.6 3.3*    No results for input(s): "LIPASE", "AMYLASE" in the last 168 hours. No results for input(s): "AMMONIA" in the last 168 hours.  CBC: Recent Labs  Lab 09/23/22 0958 09/24/22 0837 09/26/22 0603  WBC 5.4 4.7 5.3  NEUTROABS  --   --  4.2  HGB 11.0* 9.7* 9.2*  HCT 32.1*  28.4* 26.4*  MCV 103.5* 103.6* 103.5*  PLT 135* 117* 114*     Cardiac Enzymes: No results for input(s): "CKTOTAL", "CKMB", "CKMBINDEX", "TROPONINI" in the last 168 hours.  BNP: Invalid input(s): "POCBNP"  CBG: Recent Labs  Lab 09/26/22 0257  GLUCAP 114*     Microbiology: Results for orders placed or performed during the hospital encounter of 09/26/22  Culture, blood (x 2)     Status: None (Preliminary result)   Collection Time: 09/26/22 11:36 AM   Specimen: BLOOD  Result Value Ref Range Status   Specimen Description BLOOD RIGHT ANTECUBITAL  Final   Special Requests   Final    BOTTLES DRAWN AEROBIC AND ANAEROBIC Blood Culture adequate volume   Culture   Final    NO GROWTH 2 DAYS Performed at [facility], [address], [place], [place] [address]    Report Status PENDING  Incomplete    Coagulation Studies: Recent Labs    09/26/22 0603  LABPROT 15.1  INR 1.2     Urinalysis: No results for input(s): "COLORURINE", "LABSPEC", "PHURINE", "GLUCOSEU", "HGBUR", "BILIRUBINUR", "KETONESUR", "PROTEINUR", "UROBILINOGEN", "NITRITE", "LEUKOCYTESUR" in the last 72 hours.  Invalid input(s): "APPERANCEUR"    Imaging: DG Chest 2 View  Result Date: 09/27/2022 CLINICAL DATA:  Cough and fever. EXAM: CHEST - 2 VIEW COMPARISON:  Radiograph 09/09/2022 FINDINGS:  The cardiomediastinal contours are normal. Subsegmental scarring at the left lung base. Pulmonary vasculature is normal. No consolidation, pleural effusion, or pneumothorax. Multiple remote right rib fractures. No acute osseous abnormalities are seen. IMPRESSION: No acute airspace disease or explanation for symptoms. Subsegmental scarring at the left lung base. Electronically Signed   By: Narda Rutherford M.D.   On: 09/27/2022 15:20     Medications:      amLODipine  10 mg Oral Daily   amoxicillin-clavulanate  1 tablet Oral BID   benazepril  40 mg Oral Daily   buPROPion  150 mg Oral Daily   calcitRIOL  0.25 mcg  Oral Daily   doxepin  100 mg Oral QHS   heparin  5,000 Units Subcutaneous Q8H   hydrALAZINE  100 mg Oral TID   lamoTRIgine  25 mg Oral BH-q8a4p   OLANZapine  10 mg Oral QHS   polyethylene glycol  17 g Oral Daily   rosuvastatin  10 mg Oral Daily   acetaminophen **OR** acetaminophen, ondansetron **OR** ondansetron (ZOFRAN) IV, senna-docusate, traZODone  Assessment/ Plan:  [name]. is a 77 y.o.  male white male with end stage renal disease on hemodialysis, hypertension, gout, depression, history of melanoma, crohn's, and CVA who presents to [facility] on 07/30/2022 for Bipolar depression (HCC) [F31.9]   Principal Problem:   Bipolar depression (HCC) End-stage renal disease.  **Due to extended admission, patient will need to be re-established with outpatient clinic prior to discharge. Please contact renal navigator for discharge updates**   End Stage Renal Disease on hemodialysis:  Patient receiving dialysis today, UF goal 1 to 1.5 L as tolerated Next treatment scheduled for Thursday.   Potassium [facility] vascular lab)  Date Value Ref Range Status  09/28/2021 3.6 3.5 - 5.1 mmol/L Final    Comment:    Performed at [facility], [address] [place], [place] [address]    Intake/Output Summary (Last 24 hours) at 09/28/2022 1620 Last data filed at 09/28/2022 0326 Gross per 24 hour  Intake 100 ml  Output --  Net 100 ml     2. Hypertension with chronic kidney disease: Maintain the patient on benazepril at this time. BP (!) 148/61 (BP Location: Right Arm)   Pulse 77   Temp 98 F (36.7 C)   Resp 16   SpO2 98%   Blood pressure stable during dialysis, 113/73.  3. Anemia of chronic kidney disease/ kidney injury/chronic disease/acute blood loss:   Lab Results  Component Value Date   HGB 9.2 (L) 09/26/2022  Hgb within desired range. Continue Epogen 4000 units IV with dialysis.   4. Secondary Hyperparathyroidism:     Lab Results  Component Value Date   CALCIUM  8.8 (L) 09/26/2022   CAION 1.17 11/05/2021   PHOS 4.4 09/24/2022   Will continue to monitor bone minerals during this admission.  Continue daily calcitriol    LOS: 2 [facility] 7/9/20244:20 PM

## 2022-09-28 NOTE — Progress Notes (Signed)
Hemodialysis Note  Received patient in bed to unit. Alert and oriented. Informed consent signed and in chart.   Treatment initiated:1416 Treatment completed:1813  Patient tolerated treatment well. Transported back to the room alert, without acute distress. Report given to patient's RN.  Access used:LUA AVF  Access issues:None   Total UF removed:1134 ml/1.1 liters  Medications given: None  Post HD VS: Stable  Post HD weight:78.3 kg   Bartolo Darter, RN [facility]

## 2022-09-28 NOTE — TOC Initial Note (Signed)
Transition of Care (TOC) - Initial/Assessment Note    Patient Details  Name: Luis Miller. MRN: [id number] Date of Birth: [birth year]  Transition [facility]) CM/SW Contact:    Allena Katz, LCSW Phone Number: 09/28/2022, 10:41 AM  Clinical Narrative:     Pt admitted from [facility] from [facility]. Plan is for patient to return once medically stable. TOC following for care plan updates and needs.                    Patient Goals and CMS Choice            Expected Discharge Plan and Services                                              Prior Living Arrangements/Services                       Activities of Daily Living Home Assistive Devices/Equipment: None ADL Screening (condition at time of admission) Patient's cognitive ability adequate to safely complete daily activities?: Yes Is the patient deaf or have difficulty hearing?: Yes Does the patient have difficulty seeing, even when wearing glasses/contacts?: No Does the patient have difficulty concentrating, remembering, or making decisions?: No Patient able to express need for assistance with ADLs?: Yes Does the patient have difficulty dressing or bathing?: No Independently performs ADLs?: Yes (appropriate for developmental age) Does the patient have difficulty walking or climbing stairs?: Yes Weakness of Legs: Both Weakness of Arms/Hands: None  Permission Sought/Granted                  Emotional Assessment              Admission diagnosis:  Sepsis (HCC) [A41.9] Patient Active Problem List   Diagnosis Date Noted   Sepsis (HCC) 09/26/2022   SIRS (systemic inflammatory response syndrome) (HCC) 09/26/2022   Malfunction of arteriovenous graft (HCC) 09/20/2022   Bipolar I disorder, most recent episode depressed (HCC) 07/30/2022   Bipolar depression (HCC) 07/30/2022   Bipolar I disorder, mild, current or most recent episode depressed, with anxious distress (HCC) 07/26/2022    Anxiety disorder 01/06/2022   ESRD on dialysis (HCC) 08/24/2021   Sleep apnea 08/24/2021   Type 2 diabetes mellitus with other diabetic kidney complication (HCC) 05/25/2021   Abnormal TSH 10/01/2020   Memory loss 05/19/2020   Hyperlipidemia 05/19/2020   At risk for prolonged QT interval syndrome 04/24/2020   Insomnia due to medical condition 04/24/2020   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   Hypertension associated with diabetes (HCC) 02/01/2019   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   Hemorrhoids, internal 12/12/2017   Hx of adenomatous colonic polyps 12/05/2017   Thrombosed external hemorrhoid 12/05/2017   Drug-induced tremor 05/08/2017   Secondary hyperparathyroidism of renal origin (HCC) 04/05/2017   Advanced care planning/counseling discussion 04/05/2017   Carotid bruit 04/05/2017   Gout 02/26/2016   Erectile dysfunction due to arterial insufficiency 07/08/2015   Obesity 11/27/2014   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   Hypertension 09/09/2014   Bipolar disorder, in full remission, most recent episode depressed (HCC) 09/09/2014   High risk medication use 10/18/2013   Elevated prostate specific antigen (PSA) 01/13/2012   Benign prostatic hyperplasia with urinary obstruction 01/13/2012   Peyronie's disease 01/13/2012   PCP:  Larae Grooms, NP Pharmacy:   [facility] [phone] [place], [place] -  [address] [place] [address] Phone: [phone] Fax: [phone]  [facility] - [place], [place] - [address] [place] [address] Phone: [phone] Fax: [phone]     Social Determinants of Health (SDOH) Social History: SDOH Screenings   Food Insecurity: No Food Insecurity (07/30/2022)  Housing: Low Risk  (07/30/2022)  Transportation Needs: No Transportation Needs (07/30/2022)  Utilities: Not At Risk (07/30/2022)  Alcohol Screen: Low Risk  (07/31/2022)  Depression (PHQ2-9): Low Risk  (07/26/2022)  Financial  Resource Strain: Low Risk  (12/07/2021)  Physical Activity: Insufficiently Active (12/07/2021)  Social Connections: Moderately Integrated (12/07/2021)  Stress: No Stress Concern Present (12/07/2021)  Tobacco Use: Medium Risk (09/28/2022)   SDOH Interventions:     Readmission Risk Interventions     No data to display

## 2022-09-28 NOTE — Progress Notes (Signed)
Progress Note    Luis Miller.  ZOX:[id number] DOB: 11-Aug-1945  DOA: 09/26/2022 PCP: Larae Grooms, NP      Brief Narrative:    Medical records reviewed and are as summarized below:  Luis Miller. is a 77 y.o. male with medical history significant for bipolar disorder, ESRD on HD, type II DM, hypertension, history of melanoma, gout, Crohn's disease, history of stroke who initially presented to the hospital on 07/30/2022 because of depression.  He has been on admission at the [facility] psych unit since then for depression and suicidal ideation and was awaiting placement to a mental health facility.  Hospitalist team was consulted on 09/26/2022 because of abnormal vital signs (fever of 102.5 F, heart rate of 135 and blood pressure 152/88 and subsequently trending down to 111/49).  Rapid response was called as a result of abnormal vital signs.  He was admitted to the medical floor for sepsis of unknown etiology.  He was treated with IV fluids and empiric IV antibiotics.      Assessment/Plan:   Principal Problem:   Sepsis (HCC) Active Problems:   Hypertension   Bipolar disorder, in full remission, most recent episode depressed (HCC)   Type 2 diabetes mellitus with other diabetic kidney complication (HCC)    There is no height or weight on file to calculate BMI.   Suspected sepsis: Unknown etiology.  No acute abnormality noted on chest x-ray.  No urinary symptoms.  De-escalate antibiotics from IV cefepime and vancomycin to Augmentin.  No growth on blood cultures thus far.   ESRD on HD, secondary hyperparathyroidism: Plan for hemodialysis today.  Follow-up with nephrologist.   Type II DM: Glucose levels are okay.  Continue to monitor   Hypertension: BP is elevated.  Restart home antihypertensives (amlodipine, benazepril, hydralazine)   Bipolar disorder, depression with suicidal ideation: He is on bupropion, doxepin, olanzapine and trazodone.  He is on 1-1 suicide  watch.   History of stroke   Patient is asymptomatic deemed medically stable for discharge back to the behavioral health unit.  I have notified Dr. Marval Regal psychiatrist and his nurse practitioner, Thurston Hole, NP    Diet Order             Diet Heart Room service appropriate? Yes; Fluid consistency: Thin  Diet effective now                            Consultants: Psychiatrist  Procedures: None    Medications:    amLODipine  10 mg Oral Daily   benazepril  40 mg Oral Daily   buPROPion  150 mg Oral Daily   calcitRIOL  0.25 mcg Oral Daily   doxepin  100 mg Oral QHS   heparin  5,000 Units Subcutaneous Q8H   hydrALAZINE  100 mg Oral TID   lamoTRIgine  25 mg Oral BH-q8a4p   OLANZapine  10 mg Oral QHS   polyethylene glycol  17 g Oral Daily   rosuvastatin  10 mg Oral Daily   Continuous Infusions:  ceFEPime (MAXIPIME) IV Stopped (09/28/22 0326)   vancomycin       Anti-infectives (From admission, onward)    Start     Dose/Rate Route Frequency Ordered Stop   09/28/22 1200  vancomycin (VANCOREADY) IVPB 750 mg/150 mL  Status:  Discontinued        750 mg 150 mL/hr over 60 Minutes Intravenous Every T-Th-Sa (Hemodialysis) 09/26/22 0553 09/26/22  1610   09/28/22 1200  vancomycin (VANCOREADY) IVPB 750 mg/150 mL        750 mg 150 mL/hr over 60 Minutes Intravenous Every T-Th-Sa (Hemodialysis) 09/26/22 0949     09/27/22 1800  ceFEPIme (MAXIPIME) 1 g in sodium chloride 0.9 % 100 mL IVPB        1 g 200 mL/hr over 30 Minutes Intravenous Every 24 hours 09/26/22 0547     09/26/22 1045  vancomycin (VANCOREADY) IVPB 1750 mg/350 mL        1,750 mg 175 mL/hr over 120 Minutes Intravenous  Once 09/26/22 0949 09/26/22 1152   09/26/22 1000  vancomycin (VANCOREADY) IVPB 1750 mg/350 mL  Status:  Discontinued        1,750 mg 175 mL/hr over 120 Minutes Intravenous  Once 09/26/22 0852 09/26/22 0946   09/26/22 1000  ceFEPIme (MAXIPIME) 1 g in sodium chloride 0.9 % 100 mL IVPB   Status:  Discontinued        1 g 200 mL/hr over 30 Minutes Intravenous  Once 09/26/22 0853 09/26/22 0946   09/26/22 0645  vancomycin (VANCOREADY) IVPB 1750 mg/350 mL  Status:  Discontinued        1,750 mg 175 mL/hr over 120 Minutes Intravenous  Once 09/26/22 0546 09/26/22 0852   09/26/22 0645  ceFEPIme (MAXIPIME) 1 g in sodium chloride 0.9 % 100 mL IVPB  Status:  Discontinued        1 g 200 mL/hr over 30 Minutes Intravenous  Once 09/26/22 0546 09/26/22 0853   09/26/22 0630  metroNIDAZOLE (FLAGYL) IVPB 500 mg  Status:  Discontinued        500 mg 100 mL/hr over 60 Minutes Intravenous Every 12 hours 09/26/22 0541 09/26/22 0946              Family Communication/Anticipated D/C date and plan/Code Status   DVT prophylaxis: heparin injection 5,000 Units Start: 09/26/22 0630     Code Status: Full Code  Family Communication: None Disposition Plan: Plan to discharge to behavioral health unit   Status is: Inpatient Remains inpatient appropriate because: On IV antibiotics for sepsis       Subjective:   Interval events noted.  He has no complaints.  No cough, shortness of breath or chest pain.  No urinary symptoms.  Objective:    Vitals:   09/28/22 0403 09/28/22 0527 09/28/22 0715 09/28/22 1130  BP: (!) 192/90 (!) 155/83 (!) 163/84 (!) 140/75  Pulse: 91 97 88 86  Resp: 18  16 17   Temp: (!) 97.5 F (36.4 C)  97.6 F (36.4 C) 98 F (36.7 C)  TempSrc:      SpO2: 99% 100% 100% 97%   No data found.   Intake/Output Summary (Last 24 hours) at 09/28/2022 1208 Last data filed at 09/28/2022 0326 Gross per 24 hour  Intake 100 ml  Output --  Net 100 ml   There were no vitals filed for this visit.  Exam:  GEN: NAD SKIN: Warm and dry EYES: Anicteric ENT: MMM CV: RRR PULM: CTA B ABD: soft, ND, NT, +BS CNS: AAO x 3, non focal EXT: No edema or tenderness PSYCH: Depressed.       Data Reviewed:   I have personally reviewed following labs and imaging  studies:  Labs: Labs show the following:   Basic Metabolic Panel: Recent Labs  Lab 09/21/22 1419 09/24/22 0837 09/26/22 0603  NA 138 138 133*  K 4.4 3.9 3.5  CL 102 103 96*  CO2  24 23 26   GLUCOSE 171* 105* 88  BUN 80* 77* 64*  CREATININE 6.24* 5.94* 4.96*  CALCIUM 8.9 9.0 8.8*  PHOS 4.3 4.4  --    GFR Estimated Creatinine Clearance: 12.3 mL/min (A) (by C-G formula based on SCr of 4.96 mg/dL (H)). Liver Function Tests: Recent Labs  Lab 09/21/22 1419 09/24/22 0837 09/26/22 0603  AST  --   --  31  ALT  --   --  15  ALKPHOS  --   --  99  BILITOT  --   --  0.7  PROT  --   --  6.1*  ALBUMIN 3.9 3.6 3.3*   No results for input(s): "LIPASE", "AMYLASE" in the last 168 hours. No results for input(s): "AMMONIA" in the last 168 hours. Coagulation profile Recent Labs  Lab 09/26/22 0603  INR 1.2    CBC: Recent Labs  Lab 09/21/22 1538 09/23/22 0958 09/24/22 0837 09/26/22 0603  WBC 3.5* 5.4 4.7 5.3  NEUTROABS  --   --   --  4.2  HGB 10.0* 11.0* 9.7* 9.2*  HCT 29.2* 32.1* 28.4* 26.4*  MCV 103.9* 103.5* 103.6* 103.5*  PLT 134* 135* 117* 114*   Cardiac Enzymes: No results for input(s): "CKTOTAL", "CKMB", "CKMBINDEX", "TROPONINI" in the last 168 hours. BNP (last 3 results) No results for input(s): "PROBNP" in the last 8760 hours. CBG: Recent Labs  Lab 09/26/22 0257  GLUCAP 114*   D-Dimer: No results for input(s): "DDIMER" in the last 72 hours. Hgb A1c: No results for input(s): "HGBA1C" in the last 72 hours. Lipid Profile: No results for input(s): "CHOL", "HDL", "LDLCALC", "TRIG", "CHOLHDL", "LDLDIRECT" in the last 72 hours. Thyroid function studies: No results for input(s): "TSH", "T4TOTAL", "T3FREE", "THYROIDAB" in the last 72 hours.  Invalid input(s): "FREET3" Anemia work up: No results for input(s): "VITAMINB12", "FOLATE", "FERRITIN", "TIBC", "IRON", "RETICCTPCT" in the last 72 hours. Sepsis Labs: Recent Labs  Lab 09/21/22 1538 09/23/22 0958  09/24/22 0837 09/26/22 0603 09/26/22 0736  PROCALCITON  --   --   --  0.30  --   WBC 3.5* 5.4 4.7 5.3  --   LATICACIDVEN  --   --   --  1.0 0.7    Microbiology Recent Results (from the past 240 hour(s))  Culture, blood (x 2)     Status: None (Preliminary result)   Collection Time: 09/26/22  6:03 AM   Specimen: BLOOD RIGHT ARM  Result Value Ref Range Status   Specimen Description BLOOD RIGHT ARM  Final   Special Requests   Final    BOTTLES DRAWN AEROBIC AND ANAEROBIC Blood Culture adequate volume   Culture   Final    NO GROWTH 2 DAYS Performed at [facility], [address] [place], [place] [address]    Report Status PENDING  Incomplete  Culture, blood (x 2)     Status: None (Preliminary result)   Collection Time: 09/26/22 11:36 AM   Specimen: BLOOD  Result Value Ref Range Status   Specimen Description BLOOD RIGHT ANTECUBITAL  Final   Special Requests   Final    BOTTLES DRAWN AEROBIC AND ANAEROBIC Blood Culture adequate volume   Culture   Final    NO GROWTH 2 DAYS Performed at [facility], [address] [place], [place] [address]    Report Status PENDING  Incomplete    Procedures and diagnostic studies:  DG Chest 2 View  Result Date: 09/27/2022 CLINICAL DATA:  Cough and fever. EXAM: CHEST -  2 VIEW COMPARISON:  Radiograph 09/09/2022 FINDINGS: The cardiomediastinal contours are normal. Subsegmental scarring at the left lung base. Pulmonary vasculature is normal. No consolidation, pleural effusion, or pneumothorax. Multiple remote right rib fractures. No acute osseous abnormalities are seen. IMPRESSION: No acute airspace disease or explanation for symptoms. Subsegmental scarring at the left lung base. Electronically Signed   By: Narda Rutherford M.D.   On: 09/27/2022 15:20               LOS: 2 days   Sully Dyment  [facility]   Pager on [email or web address]. If 7PM-7AM, please contact night-coverage at [email or web address]     09/28/2022,  12:08 PM

## 2022-09-29 DIAGNOSIS — F3289 Other specified depressive episodes: Secondary | ICD-10-CM

## 2022-09-29 MED ORDER — ORAL CARE MOUTH RINSE: 15.0000 mL | OROMUCOSAL | Status: DC | PRN

## 2022-09-29 NOTE — Progress Notes (Signed)
Mobility Specialist - Progress Note   09/29/22 1001  Mobility  Activity Ambulated independently in hallway  Level of Assistance Independent  Assistive Device None  Distance Ambulated (ft) 320 ft  Activity Response Tolerated well  Mobility Referral Yes  $Mobility charge 1 Mobility  Mobility Specialist Start Time (ACUTE ONLY) 0933  Mobility Specialist Stop Time (ACUTE ONLY) 0957  Mobility Specialist Time Calculation (min) (ACUTE ONLY) 24 min   Pt sitting in recliner on RA upon arrival. Pt STS and ambulates 2 laps around NS indep with no LOB noted. Pt returns to recliner with needs in reach and sitter present.   Terrilyn Saver  Mobility Specialist  09/29/22 10:03 AM

## 2022-09-29 NOTE — Care Management Important Message (Signed)
Important Message  Patient Details  Name: Luis Miller. MRN: [id number] Date of Birth: Mar 19, 1946   Medicare Important Message Given:  N/A - LOS <3 / Initial given by admissions     [name] [name] 09/29/2022, 8:41 AM

## 2022-09-29 NOTE — TOC Progression Note (Signed)
Transition of Care (TOC) - Progression Note    Patient Details  Name: Luis Miller. MRN: [id number] Date of Birth: [birth year]  Transition of Care [facility]) CM/SW Contact  Allena Katz, LCSW Phone Number: 09/29/2022, 4:18 PM  Clinical Narrative:   Referrals faxed out to inpatient psych facilities in epic.          Expected Discharge Plan and Services                                               Social Determinants of Health (SDOH) Interventions SDOH Screenings   Food Insecurity: No Food Insecurity (07/30/2022)  Housing: Patient Declined (07/30/2022)  Transportation Needs: No Transportation Needs (07/30/2022)  Utilities: Not At Risk (07/30/2022)  Alcohol Screen: Low Risk  (07/31/2022)  Depression (PHQ2-9): Low Risk  (07/26/2022)  Financial Resource Strain: Low Risk  (12/07/2021)  Physical Activity: Insufficiently Active (12/07/2021)  Social Connections: Moderately Integrated (12/07/2021)  Stress: No Stress Concern Present (12/07/2021)  Tobacco Use: Medium Risk (09/28/2022)    Readmission Risk Interventions     No data to display

## 2022-09-29 NOTE — Progress Notes (Signed)
Patient's room situated per [facility] SI policy. Belongings (2 bags) taken to [facility] locked room for safe keeping.

## 2022-09-29 NOTE — Progress Notes (Signed)
Patient with flat and withdrawn affect. Pt denies any active SI or HI at this moment. He does endorse feeling like once he is discharged and "home alone" he may "try to hurt himself." Pt states he feels like he has "nothing to live for." He further elaborates that he has lost interest in everything and "can't even pray." Pt endorses feeling void of emotion, stating "I can't cry. I can't laugh. I just exist."   Pt retired from [employer], years ago, after 25 years of service. He said he used to "go dancing" for fun, but is "afraid of what people would say" if he went now. He endorses feeling like other people are "talking negatively" about him. He does not appear to be responding to any internal stimuli, and denies hallucinations.   Sitter at bedside. SI precautions in place.

## 2022-09-29 NOTE — Progress Notes (Signed)
PROGRESS NOTE    Luis Miller.  ZOX:[id number] DOB: Nov 19, 1945 DOA: 09/26/2022 PCP: Larae Grooms, NP  Assessment & Plan:   Principal Problem:   Sepsis [facility]) Active Problems:   Hypertension   Bipolar disorder, in full remission, most recent episode depressed (HCC)   Type 2 diabetes mellitus with other diabetic kidney complication (HCC)  Assessment and Plan: Suspected sepsis: unknown etiology.  No acute abnormality noted on chest x-ray.  No urinary symptoms.  De-escalate antibiotics from IV cefepime and vancomycin to Augmentin.  Blood cxs NGTD. No sepsis currently    ESRD: on HD. HD as per nephro. Nephro following and recs apprec    DM2: well controlled, HbA1c 5.4.   HTN: continue on home dose of amlodipine, benazepril, hydralazine   Bipolar disorder: w/ depression with suicidal ideation. Continue on bupropion, olanzapine, trazodone. Continue w/ sitter   Hx of CVA: continue on statin      DVT prophylaxis: heparin  Code Status: full  Family Communication:  Disposition Plan: needs inpatient psych bed but no beds available at [facility] geri psych   Level of care: Telemetry Medical  Status is: Inpatient Remains inpatient appropriate because: needs inpatient psych placement    Consultants:    Procedures:   Antimicrobials: augmentin    Subjective: Pt c/o feeling depressed   Objective: Vitals:   09/28/22 1914 09/28/22 2020 09/28/22 2144 09/29/22 0434  BP: (!) 156/84  (!) 150/88 117/73  Pulse: 78  81 72  Resp:   16 14  Temp: 98.2 F (36.8 C)  (!) 97.4 F (36.3 C) 97.8 F (36.6 C)  TempSrc:   Oral Oral  SpO2: 98%  98% 99%  Weight:      Height:  5\' 6"  (1.676 m)      Intake/Output Summary (Last 24 hours) at 09/29/2022 0729 Last data filed at 09/28/2022 1800 Gross per 24 hour  Intake --  Output 1100 ml  Net -1100 ml   Filed Weights   09/28/22 1814  Weight: 78.3 kg    Examination:  General exam: Appears calm and comfortable  Respiratory system:  Clear to auscultation. Respiratory effort normal. Cardiovascular system: S1 & S2+. No rubs, gallops or clicks. No pedal edema. Gastrointestinal system: Abdomen is nondistended, soft and nontender.  Normal bowel sounds heard. Central nervous system: Alert and oriented. Moves all extremities  Psychiatry: Judgement and insight appear normal. Flat mood and affect     Data Reviewed: I have personally reviewed following labs and imaging studies  CBC: Recent Labs  Lab 09/23/22 0958 09/24/22 0837 09/26/22 0603  WBC 5.4 4.7 5.3  NEUTROABS  --   --  4.2  HGB 11.0* 9.7* 9.2*  HCT 32.1* 28.4* 26.4*  MCV 103.5* 103.6* 103.5*  PLT 135* 117* 114*   Basic Metabolic Panel: Recent Labs  Lab 09/24/22 0837 09/26/22 0603  NA 138 133*  K 3.9 3.5  CL 103 96*  CO2 23 26  GLUCOSE 105* 88  BUN 77* 64*  CREATININE 5.94* 4.96*  CALCIUM 9.0 8.8*  PHOS 4.4  --    GFR: Estimated Creatinine Clearance: 12.3 mL/min (A) (by C-G formula based on SCr of 4.96 mg/dL (H)). Liver Function Tests: Recent Labs  Lab 09/24/22 0837 09/26/22 0603  AST  --  31  ALT  --  15  ALKPHOS  --  99  BILITOT  --  0.7  PROT  --  6.1*  ALBUMIN 3.6 3.3*   No results for input(s): "LIPASE", "AMYLASE" in the last  168 hours. No results for input(s): "AMMONIA" in the last 168 hours. Coagulation Profile: Recent Labs  Lab 09/26/22 0603  INR 1.2   Cardiac Enzymes: No results for input(s): "CKTOTAL", "CKMB", "CKMBINDEX", "TROPONINI" in the last 168 hours. BNP (last 3 results) No results for input(s): "PROBNP" in the last 8760 hours. HbA1C: No results for input(s): "HGBA1C" in the last 72 hours. CBG: Recent Labs  Lab 09/26/22 0257  GLUCAP 114*   Lipid Profile: No results for input(s): "CHOL", "HDL", "LDLCALC", "TRIG", "CHOLHDL", "LDLDIRECT" in the last 72 hours. Thyroid Function Tests: No results for input(s): "TSH", "T4TOTAL", "FREET4", "T3FREE", "THYROIDAB" in the last 72 hours. Anemia Panel: Recent Labs     09/28/22 0603  VITAMINB12 273   Sepsis Labs: Recent Labs  Lab 09/26/22 0603 09/26/22 0736  PROCALCITON 0.30  --   LATICACIDVEN 1.0 0.7    Recent Results (from the past 240 hour(s))  Culture, blood (x 2)     Status: None (Preliminary result)   Collection Time: 09/26/22  6:03 AM   Specimen: BLOOD RIGHT ARM  Result Value Ref Range Status   Specimen Description BLOOD RIGHT ARM  Final   Special Requests   Final    BOTTLES DRAWN AEROBIC AND ANAEROBIC Blood Culture adequate volume   Culture   Final    NO GROWTH 2 DAYS Performed at [facility], [address] [place], [place] [address]    Report Status PENDING  Incomplete  Culture, blood (x 2)     Status: None (Preliminary result)   Collection Time: 09/26/22 11:36 AM   Specimen: BLOOD  Result Value Ref Range Status   Specimen Description BLOOD RIGHT ANTECUBITAL  Final   Special Requests   Final    BOTTLES DRAWN AEROBIC AND ANAEROBIC Blood Culture adequate volume   Culture   Final    NO GROWTH 2 DAYS Performed at [facility], [address], [place], [place] [address]    Report Status PENDING  Incomplete         Radiology Studies: DG Chest 2 View  Result Date: 09/27/2022 CLINICAL DATA:  Cough and fever. EXAM: CHEST - 2 VIEW COMPARISON:  Radiograph 09/09/2022 FINDINGS: The cardiomediastinal contours are normal. Subsegmental scarring at the left lung base. Pulmonary vasculature is normal. No consolidation, pleural effusion, or pneumothorax. Multiple remote right rib fractures. No acute osseous abnormalities are seen. IMPRESSION: No acute airspace disease or explanation for symptoms. Subsegmental scarring at the left lung base. Electronically Signed   By: Narda Rutherford M.D.   On: 09/27/2022 15:20        Scheduled Meds:  amLODipine  10 mg Oral Daily   amoxicillin-clavulanate  1 tablet Oral BID   benazepril  40 mg Oral Daily   buPROPion  150 mg Oral Daily   calcitRIOL  0.25 mcg Oral Daily    doxepin  100 mg Oral QHS   heparin  5,000 Units Subcutaneous Q8H   hydrALAZINE  100 mg Oral TID   lamoTRIgine  25 mg Oral BH-q8a4p   OLANZapine  10 mg Oral QHS   polyethylene glycol  17 g Oral Daily   rosuvastatin  10 mg Oral Daily   Continuous Infusions:   LOS: 3 days    Time spent: 25 mins     Charise Killian, MD [facility] Pager [phone]  If 7PM-7AM, please contact night-coverage [email or web address] 09/29/2022, 7:29 AM

## 2022-09-29 NOTE — TOC Progression Note (Signed)
Transition of Care (TOC) - Progression Note    Patient Details  Name: Luis Miller. MRN: [id number] Date of Birth: 1945-08-27  Transition of Care [facility]) CM/SW Contact  Allena Katz, LCSW Phone Number: 09/29/2022, 10:48 AM  Clinical Narrative:   Pt medically ready to discharge back to geri psych. Per Tiffanie, RN, Hilda Lias with [facility] is checking on when they would be able to admit the patient back.          Expected Discharge Plan and Services                                               Social Determinants of Health (SDOH) Interventions SDOH Screenings   Food Insecurity: No Food Insecurity (07/30/2022)  Housing: Patient Declined (07/30/2022)  Transportation Needs: No Transportation Needs (07/30/2022)  Utilities: Not At Risk (07/30/2022)  Alcohol Screen: Low Risk  (07/31/2022)  Depression (PHQ2-9): Low Risk  (07/26/2022)  Financial Resource Strain: Low Risk  (12/07/2021)  Physical Activity: Insufficiently Active (12/07/2021)  Social Connections: Moderately Integrated (12/07/2021)  Stress: No Stress Concern Present (12/07/2021)  Tobacco Use: Medium Risk (09/28/2022)    Readmission Risk Interventions     No data to display

## 2022-09-30 ENCOUNTER — Inpatient Hospital Stay
Admission: RE | Admit: 2022-09-30 | Discharge: 2022-10-05 | DRG: 885 | Disposition: A | Discharge status: Home / Self Care | Payer: Medicare Other | Source: Intra-hospital | Attending: Psychiatry | Admitting: Psychiatry

## 2022-09-30 ENCOUNTER — Other Ambulatory Visit: Payer: Self-pay

## 2022-09-30 ENCOUNTER — Other Ambulatory Visit: Payer: Self-pay | Admitting: Psychiatry

## 2022-09-30 ENCOUNTER — Encounter: Payer: Self-pay | Admitting: Adult Health

## 2022-09-30 DIAGNOSIS — I959 Hypotension, unspecified: Secondary | ICD-10-CM | POA: Diagnosis not present

## 2022-09-30 DIAGNOSIS — G4701 Insomnia due to medical condition: Secondary | ICD-10-CM

## 2022-09-30 DIAGNOSIS — F314 Bipolar disorder, current episode depressed, severe, without psychotic features: Principal | ICD-10-CM

## 2022-09-30 DIAGNOSIS — R45851 Suicidal ideations: Secondary | ICD-10-CM | POA: Diagnosis present

## 2022-09-30 DIAGNOSIS — Z85828 Personal history of other malignant neoplasm of skin: Secondary | ICD-10-CM | POA: Diagnosis not present

## 2022-09-30 DIAGNOSIS — E785 Hyperlipidemia, unspecified: Secondary | ICD-10-CM | POA: Diagnosis present

## 2022-09-30 DIAGNOSIS — N2581 Secondary hyperparathyroidism of renal origin: Secondary | ICD-10-CM | POA: Diagnosis present

## 2022-09-30 DIAGNOSIS — F3289 Other specified depressive episodes: Secondary | ICD-10-CM | POA: Diagnosis not present

## 2022-09-30 DIAGNOSIS — Z8616 Personal history of COVID-19: Secondary | ICD-10-CM

## 2022-09-30 DIAGNOSIS — N186 End stage renal disease: Secondary | ICD-10-CM | POA: Diagnosis present

## 2022-09-30 DIAGNOSIS — F3131 Bipolar disorder, current episode depressed, mild: Secondary | ICD-10-CM

## 2022-09-30 DIAGNOSIS — M109 Gout, unspecified: Secondary | ICD-10-CM | POA: Diagnosis present

## 2022-09-30 DIAGNOSIS — E1122 Type 2 diabetes mellitus with diabetic chronic kidney disease: Secondary | ICD-10-CM | POA: Diagnosis present

## 2022-09-30 DIAGNOSIS — R4182 Altered mental status, unspecified: Secondary | ICD-10-CM | POA: Diagnosis present

## 2022-09-30 DIAGNOSIS — D631 Anemia in chronic kidney disease: Secondary | ICD-10-CM | POA: Diagnosis present

## 2022-09-30 DIAGNOSIS — I120 Hypertensive chronic kidney disease with stage 5 chronic kidney disease or end stage renal disease: Secondary | ICD-10-CM | POA: Diagnosis present

## 2022-09-30 DIAGNOSIS — Z85820 Personal history of malignant melanoma of skin: Secondary | ICD-10-CM

## 2022-09-30 DIAGNOSIS — F3130 Bipolar disorder, current episode depressed, mild or moderate severity, unspecified: Secondary | ICD-10-CM | POA: Diagnosis not present

## 2022-09-30 HISTORY — DX: Altered mental status, unspecified: R41.82

## 2022-09-30 LAB — CBC
HCT: 26.5 % — ABNORMAL LOW (ref 39.0–52.0)
Hemoglobin: 9.4 g/dL — ABNORMAL LOW (ref 13.0–17.0)
MCH: 35.9 pg — ABNORMAL HIGH (ref 26.0–34.0)
MCHC: 35.5 g/dL (ref 30.0–36.0)
MCV: 101.1 fL — ABNORMAL HIGH (ref 80.0–100.0)
Platelets: 137 10*3/uL — ABNORMAL LOW (ref 150–400)
RBC: 2.62 MIL/uL — ABNORMAL LOW (ref 4.22–5.81)
RDW: 14.2 % (ref 11.5–15.5)
WBC: 3.1 10*3/uL — ABNORMAL LOW (ref 4.0–10.5)
nRBC: 0 % (ref 0.0–0.2)

## 2022-09-30 LAB — BASIC METABOLIC PANEL
Anion gap: 11 (ref 5–15)
BUN: 59 mg/dL — ABNORMAL HIGH (ref 8–23)
CO2: 27 mmol/L (ref 22–32)
Calcium: 9 mg/dL (ref 8.9–10.3)
Chloride: 99 mmol/L (ref 98–111)
Creatinine, Ser: 5.06 mg/dL — ABNORMAL HIGH (ref 0.61–1.24)
GFR, Estimated: 11 mL/min — ABNORMAL LOW (ref >60)
Glucose, Bld: 96 mg/dL (ref 70–99)
Potassium: 3.8 mmol/L (ref 3.5–5.1)
Sodium: 137 mmol/L (ref 135–145)

## 2022-09-30 MED ORDER — CALCITRIOL 0.25 MCG PO CAPS
0.2500 ug | ORAL_CAPSULE | Freq: Every day | ORAL | Status: DC
  Administered 2022-10-01 – 2022-10-05 (×5): 0.25 ug via ORAL
  Filled 2022-09-30 (×5): qty 1

## 2022-09-30 MED ORDER — ROSUVASTATIN CALCIUM 5 MG PO TABS
10.0000 mg | ORAL_TABLET | Freq: Every day | ORAL | Status: DC
  Administered 2022-10-01 – 2022-10-05 (×5): 10 mg via ORAL
  Filled 2022-09-30 (×5): qty 2

## 2022-09-30 MED ORDER — OLANZAPINE 5 MG PO TABS
10.0000 mg | ORAL_TABLET | Freq: Every day | ORAL | Status: DC
  Administered 2022-09-30 – 2022-10-04 (×5): 10 mg via ORAL
  Filled 2022-09-30 (×5): qty 2

## 2022-09-30 MED ORDER — ALUM & MAG HYDROXIDE-SIMETH 200-200-20 MG/5ML PO SUSP: 30.0000 mL | ORAL | Status: DC | PRN

## 2022-09-30 MED ORDER — LORAZEPAM 2 MG/ML IJ SOLN: 2.0000 mg | Freq: Three times a day (TID) | INTRAMUSCULAR | Status: DC | PRN

## 2022-09-30 MED ORDER — DIPHENHYDRAMINE HCL 50 MG/ML IJ SOLN: 50.0000 mg | Freq: Three times a day (TID) | INTRAMUSCULAR | Status: DC | PRN

## 2022-09-30 MED ORDER — MAGNESIUM HYDROXIDE 400 MG/5ML PO SUSP: 30.0000 mL | Freq: Every day | ORAL | Status: DC | PRN

## 2022-09-30 MED ORDER — DOXEPIN HCL 50 MG PO CAPS
100.0000 mg | ORAL_CAPSULE | Freq: Every day | ORAL | Status: DC
  Administered 2022-09-30 – 2022-10-04 (×5): 100 mg via ORAL
  Filled 2022-09-30 (×5): qty 2

## 2022-09-30 MED ORDER — DIPHENHYDRAMINE HCL 25 MG PO CAPS: 50.0000 mg | ORAL_CAPSULE | Freq: Three times a day (TID) | ORAL | Status: DC | PRN

## 2022-09-30 MED ORDER — PENTAFLUOROPROP-TETRAFLUOROETH EX AERO
INHALATION_SPRAY | CUTANEOUS | Status: AC
  Filled 2022-09-30: qty 30

## 2022-09-30 MED ORDER — BUPROPION HCL ER (XL) 150 MG PO TB24
150.0000 mg | ORAL_TABLET | Freq: Every day | ORAL | Status: DC
  Administered 2022-10-01 – 2022-10-05 (×5): 150 mg via ORAL
  Filled 2022-09-30 (×5): qty 1

## 2022-09-30 MED ORDER — HALOPERIDOL 0.5 MG PO TABS: 5.0000 mg | ORAL_TABLET | Freq: Three times a day (TID) | ORAL | Status: DC | PRN

## 2022-09-30 MED ORDER — ADULT MULTIVITAMIN W/MINERALS CH
1.0000 | ORAL_TABLET | Freq: Every day | ORAL | Status: DC
  Administered 2022-10-01 – 2022-10-05 (×5): 1 via ORAL
  Filled 2022-09-30 (×5): qty 1

## 2022-09-30 MED ORDER — LOPERAMIDE HCL 2 MG PO CAPS: 2.0000 mg | ORAL_CAPSULE | Freq: Every day | ORAL | Status: DC | PRN

## 2022-09-30 MED ORDER — OLANZAPINE 10 MG PO TABS: 10.0000 mg | ORAL_TABLET | Freq: Every day | ORAL | Status: DC

## 2022-09-30 MED ORDER — ACETAMINOPHEN 325 MG PO TABS
650.0000 mg | ORAL_TABLET | Freq: Four times a day (QID) | ORAL | Status: DC | PRN
Start: 2022-09-30 — End: 2022-09-30

## 2022-09-30 MED ORDER — LORAZEPAM 2 MG PO TABS: 2.0000 mg | ORAL_TABLET | Freq: Three times a day (TID) | ORAL | Status: DC | PRN

## 2022-09-30 MED ORDER — ACETAMINOPHEN 325 MG PO TABS: 650.0000 mg | ORAL_TABLET | Freq: Four times a day (QID) | ORAL | Status: DC | PRN

## 2022-09-30 MED ORDER — DOXEPIN HCL 100 MG PO CAPS: 100.0000 mg | ORAL_CAPSULE | Freq: Every day | ORAL | Status: DC

## 2022-09-30 MED ORDER — HALOPERIDOL LACTATE 5 MG/ML IJ SOLN: 5.0000 mg | Freq: Three times a day (TID) | INTRAMUSCULAR | Status: DC | PRN

## 2022-09-30 MED ORDER — LORATADINE 10 MG PO TABS
10.0000 mg | ORAL_TABLET | Freq: Every day | ORAL | Status: DC
  Administered 2022-10-01 – 2022-10-05 (×5): 10 mg via ORAL
  Filled 2022-09-30 (×5): qty 1

## 2022-09-30 MED ORDER — VITAMIN D 25 MCG (1000 UNIT) PO TABS
1000.0000 [IU] | ORAL_TABLET | Freq: Every day | ORAL | Status: DC
  Administered 2022-10-01 – 2022-10-05 (×5): 1000 [IU] via ORAL
  Filled 2022-09-30 (×5): qty 1

## 2022-09-30 MED ORDER — BUPROPION HCL ER (XL) 150 MG PO TB24: 150.0000 mg | ORAL_TABLET | Freq: Every day | ORAL | Status: DC

## 2022-09-30 MED ORDER — EPOETIN ALFA 4000 UNIT/ML IJ SOLN: 4000.0000 [IU] | INTRAMUSCULAR | Status: DC

## 2022-09-30 MED ORDER — AMLODIPINE BESYLATE 5 MG PO TABS
10.0000 mg | ORAL_TABLET | Freq: Every day | ORAL | Status: DC
  Administered 2022-10-01: 10 mg via ORAL
  Filled 2022-09-30: qty 2

## 2022-09-30 MED ORDER — HYDRALAZINE HCL 50 MG PO TABS
100.0000 mg | ORAL_TABLET | Freq: Three times a day (TID) | ORAL | Status: DC
  Administered 2022-09-30 – 2022-10-05 (×8): 100 mg via ORAL
  Filled 2022-09-30 (×16): qty 2

## 2022-09-30 MED ORDER — LAMOTRIGINE 25 MG PO TABS
25.0000 mg | ORAL_TABLET | ORAL | Status: DC
  Administered 2022-09-30 – 2022-10-05 (×8): 25 mg via ORAL
  Filled 2022-09-30 (×7): qty 1

## 2022-09-30 MED ORDER — BENAZEPRIL HCL 20 MG PO TABS
40.0000 mg | ORAL_TABLET | Freq: Every day | ORAL | Status: DC
  Administered 2022-10-01 – 2022-10-05 (×5): 40 mg via ORAL
  Filled 2022-09-30 (×5): qty 2

## 2022-09-30 MED ORDER — LAMOTRIGINE 25 MG PO TABS: 25.0000 mg | ORAL_TABLET | ORAL | Status: DC

## 2022-09-30 NOTE — Group Note (Signed)
Date:  09/30/2022 Time:  9:13 PM  Group Topic/Focus:  Coping With Mental Health Crisis:   The purpose of this group is to help patients identify strategies for coping with mental health crisis.  Group discusses possible causes of crisis and ways to manage them effectively.    Participation Level:  Active  Participation Quality:  Attentive  Affect:  Appropriate  Cognitive:  Appropriate  Insight: Good  Engagement in Group:  Engaged  Modes of Intervention:  Discussion  Additional Comments:  Adult Unit Workbook pg.[phone]  Roberto Scales 09/30/2022, 9:13 PM

## 2022-09-30 NOTE — Plan of Care (Deleted)
.  tpmd

## 2022-09-30 NOTE — Plan of Care (Signed)
  Problem: Health Behavior/Discharge Planning: Goal: Ability to manage health-related needs will improve Outcome: Progressing   Problem: Clinical Measurements: Goal: Ability to maintain clinical measurements within normal limits will improve Outcome: Progressing   

## 2022-09-30 NOTE — Progress Notes (Signed)
   09/30/22 2300  Psych Admission Type (Psych Patients Only)  Admission Status Voluntary  Psychosocial Assessment  Patient Complaints Worrying  Eye Contact Fair  Facial Expression Flat  Affect Flat  Speech Logical/coherent  Interaction Assertive  Motor Activity Slow  Appearance/Hygiene In scrubs  Behavior Characteristics Cooperative  Mood Depressed  Thought Process  Coherency WDL  Content WDL  Delusions None reported or observed  Perception WDL  Hallucination None reported or observed  Judgment WDL  Confusion WDL  Danger to Self  Current suicidal ideation? Denies  Self-Injurious Behavior No self-injurious ideation or behavior indicators observed or expressed   Agreement Not to Harm Self Yes  Description of Agreement Verbal  Danger to Others  Danger to Others None reported or observed

## 2022-09-30 NOTE — Progress Notes (Signed)
Attempted to give report to [facility] psych. Nurse unavailable. Will call back.

## 2022-09-30 NOTE — Progress Notes (Signed)
[facility]  Dialysis Note   Subjective:   Patient seen sitting up in chair Alert and oriented Eating breakfast States he feels well this morning  Safety sitter at bedside Dialysis treatment later today  Objective:  Vital signs in last 24 hours:  Temp:  [98 F (36.7 C)-98.5 F (36.9 C)] 98 F (36.7 C) (07/11 0728) Pulse Rate:  [77-92] 91 (07/11 0728) Resp:  [14-18] 18 (07/11 0728) BP: (102-139)/(58-100) 119/100 (07/11 0728) SpO2:  [99 %-100 %] 100 % (07/11 0728)  Weight change:  Filed Weights   09/28/22 1814  Weight: 78.3 kg    Intake/Output: No intake/output data recorded.   Intake/Output this shift:  No intake/output data recorded.  Physical Exam: General: NAD  Head: Normocephalic, atraumatic. Moist oral mucosal membranes  Eyes: Anicteric  Lungs:  Clear to auscultation  Heart: Regular rate and rhythm  Abdomen:  Soft, nontender  Extremities: No peripheral edema.  Neurologic: Alert, moving all four extremities  Skin: No lesions  Access: Lt AVF    Basic Metabolic Panel: Recent Labs  Lab 09/24/22 0837 09/26/22 0603 09/30/22 0353  NA 138 133* 137  K 3.9 3.5 3.8  CL 103 96* 99  CO2 23 26 27   GLUCOSE 105* 88 96  BUN 77* 64* 59*  CREATININE 5.94* 4.96* 5.06*  CALCIUM 9.0 8.8* 9.0  PHOS 4.4  --   --     Liver Function Tests: Recent Labs  Lab 09/24/22 0837 09/26/22 0603  AST  --  31  ALT  --  15  ALKPHOS  --  99  BILITOT  --  0.7  PROT  --  6.1*  ALBUMIN 3.6 3.3*   No results for input(s): "LIPASE", "AMYLASE" in the last 168 hours. No results for input(s): "AMMONIA" in the last 168 hours.  CBC: Recent Labs  Lab 09/24/22 0837 09/26/22 0603 09/30/22 0353  WBC 4.7 5.3 3.1*  NEUTROABS  --  4.2  --   HGB 9.7* 9.2* 9.4*  HCT 28.4* 26.4* 26.5*  MCV 103.6* 103.5* 101.1*  PLT 117* 114* 137*    Cardiac Enzymes: No results for input(s): "CKTOTAL", "CKMB", "CKMBINDEX", "TROPONINI" in the last 168 hours.  BNP: Invalid  input(s): "POCBNP"  CBG: Recent Labs  Lab 09/26/22 0257  GLUCAP 114*    Microbiology: Results for orders placed or performed during the hospital encounter of 09/26/22  Culture, blood (x 2)     Status: None (Preliminary result)   Collection Time: 09/26/22 11:36 AM   Specimen: BLOOD  Result Value Ref Range Status   Specimen Description BLOOD RIGHT ANTECUBITAL  Final   Special Requests   Final    BOTTLES DRAWN AEROBIC AND ANAEROBIC Blood Culture adequate volume   Culture   Final    NO GROWTH 4 DAYS Performed at [facility], [address], [place], [place] [address]    Report Status PENDING  Incomplete    Coagulation Studies: No results for input(s): "LABPROT", "INR" in the last 72 hours.  Urinalysis: No results for input(s): "COLORURINE", "LABSPEC", "PHURINE", "GLUCOSEU", "HGBUR", "BILIRUBINUR", "KETONESUR", "PROTEINUR", "UROBILINOGEN", "NITRITE", "LEUKOCYTESUR" in the last 72 hours.  Invalid input(s): "APPERANCEUR"    Imaging: No results found.   Medications:      amLODipine  10 mg Oral Daily   amoxicillin-clavulanate  1 tablet Oral BID   benazepril  40 mg Oral Daily   buPROPion  150 mg Oral Daily   calcitRIOL  0.25 mcg Oral Daily   doxepin  100 mg Oral  QHS   heparin  5,000 Units Subcutaneous Q8H   hydrALAZINE  100 mg Oral TID   lamoTRIgine  25 mg Oral BH-q8a4p   OLANZapine  10 mg Oral QHS   polyethylene glycol  17 g Oral Daily   rosuvastatin  10 mg Oral Daily   acetaminophen **OR** acetaminophen, ondansetron **OR** ondansetron (ZOFRAN) IV, mouth rinse, senna-docusate, traZODone  Assessment/ Plan:  [name]. is a 77 y.o.  male white male with end stage renal disease on hemodialysis, hypertension, gout, depression, history of melanoma, crohn's, and CVA who presents to [facility] on 07/30/2022 for Bipolar depression (HCC) [F31.9]   Principal Problem:   Bipolar depression (HCC) End-stage renal disease.  **Due to extended admission, patient  will need to be re-established with outpatient clinic prior to discharge. Please contact renal navigator for discharge updates**   End Stage Renal Disease on hemodialysis:  Will receive treatment later today   [facility] vascular lab)  Date Value Ref Range Status  09/28/2021 3.6 3.5 - 5.1 mmol/L Final    Comment:    Performed at [facility], [address] [place], [place] [address]   No intake or output data in the 24 hours ending 09/30/22 1034  2. Hypertension with chronic kidney disease: Maintain the patient on benazepril at this time. BP (!) 119/100 (BP Location: Right Wrist)   Pulse 91   Temp 98 F (36.7 C)   Resp 18   Ht 5\' 6"  (1.676 m)   Wt 78.3 kg   SpO2 100%   BMI 27.86 kg/m   Blood pressure stable for this patient  3. Anemia of chronic kidney disease/ kidney injury/chronic disease/acute blood loss:   Lab Results  Component Value Date   HGB 9.4 (L) 09/30/2022  Hgb 9.4, acceptable Continue Epogen 4000 units IV with dialysis.   4. Secondary Hyperparathyroidism:     Lab Results  Component Value Date   CALCIUM 9.0 09/30/2022   CAION 1.17 11/05/2021   PHOS 4.4 09/24/2022   Continue daily calcitriol    LOS: 4 [facility] 7/11/202410:34 AM

## 2022-09-30 NOTE — Discharge Summary (Signed)
Physician Discharge Summary  Luis Miller. WUJ:[id number] DOB: [birth year] DOA: 09/26/2022  PCP: Larae Grooms, NP  Admit date: 09/26/2022 Discharge date: 09/30/2022  Admitted From: [facility] geri psych Disposition:  [facility] geri psych   Recommendations for Outpatient Follow-up:  Follow up with PCP in 1-2 weeks   Home Health: no  Equipment/Devices:  Discharge Condition: stable  CODE STATUS: full  Diet recommendation: Heart Healthy   Brief/Interim Summary: HPI was taken from Dr. Para March:  Luis Miller. is a 77 y.o. male with medical history significant of Bipolar mood disorder, ESRD on HD, DM, HTN, hospitalized in geropsych since 5/10 who is being seen in consultation for unstable vitals for which a rapid response was called.  Patient is on geropsych for severe depression with suicidal ideation and is currently awaiting placement.  Early in the morning of 7/7, he was found on the floor confused and tremulous.  At baseline he is alert and oriented.  Patient, who underwent dialysis earlier in the day was reported as being in his usual state of health until that time.  His temp was 102.5 with heart rate 135 and BP initially 152/88 trending down to 111/49. On my assessment, patient awake and lying in bed.  Appears somewhat disoriented.  When asked he states he tripped in his bed sheets and fell.Marland Kitchen   Discharge Diagnoses:  Principal Problem:   Sepsis (HCC) Active Problems:   Hypertension   Bipolar disorder, in full remission, most recent episode depressed (HCC)   Type 2 diabetes mellitus with other diabetic kidney complication (HCC)  Suspected sepsis: unknown etiology.  No acute abnormality noted on chest x-ray.  No urinary symptoms. Will complete augmentin course 09/30/22. Blood cxs NGTD. No sepsis currently    ESRD: on HD. HD as per nephro. Nephro following and recs apprec    DM2: well controlled, HbA1c 5.4.   HTN: continue on home dose of amlodipine, benazepril, hydralazine    Bipolar disorder: w/ depression with suicidal ideation. Continue on bupropion, olanzapine, doxepin, lamictal.    Hx of CVA: continue on statin  Discharge Instructions  Discharge Instructions     Diet - low sodium heart healthy   Complete by: As directed    Discharge instructions   Complete by: As directed    F/u w/ PCP in 1-2 weeks. F/u w/ psych ASAP   Increase activity slowly   Complete by: As directed       Allergies as of 09/30/2022       Reactions   Mirtazapine Rash   Aspirin    Other reaction(s): Unknown Patient was instructed not to take aspirin but can not remember why   Atomoxetine Other (See Comments)   Urinary sx   Strattera [atomoxetine Hcl] Other (See Comments)   Urinary sx   Xyzal [levocetirizine Dihydrochloride] Rash   Patient reported on 11/02/17        Medication List     STOP taking these medications    FLUoxetine 20 MG capsule Commonly known as: PROzac   fluticasone 50 MCG/ACT nasal spray Commonly known as: FLONASE       TAKE these medications    allopurinol 100 MG tablet Commonly known as: ZYLOPRIM Take 100 mg by mouth 2 (two) times daily.   amLODipine 10 MG tablet Commonly known as: NORVASC TAKE 1 TABLET DAILY   benazepril 40 MG tablet Commonly known as: LOTENSIN Take 40 mg by mouth daily.   buPROPion 150 MG 24 hr tablet Commonly known as: WELLBUTRIN XL  Take 1 tablet (150 mg total) by mouth daily. Start taking on: October 01, 2022   calcitRIOL 0.25 MCG capsule Commonly known as: ROCALTROL Take 0.25 mcg by mouth daily.   doxepin 100 MG capsule Commonly known as: SINEQUAN Take 1 capsule (100 mg total) by mouth at bedtime.   hydrALAZINE 100 MG tablet Commonly known as: APRESOLINE Take 100 mg by mouth 3 (three) times daily.   lamoTRIgine 25 MG tablet Commonly known as: LAMICTAL Take 1 tablet (25 mg total) by mouth 2 (two) times daily at 8 am and 4 pm. What changed: when to take this   loperamide 2 MG capsule Commonly  known as: IMODIUM Take 2 mg by mouth daily as needed for diarrhea or loose stools.   loratadine 10 MG tablet Commonly known as: CLARITIN TAKE 1 TABLET BY MOUTH EVERY DAY   multivitamin tablet Take 1 tablet by mouth daily.   OLANZapine 10 MG tablet Commonly known as: ZYPREXA Take 1 tablet (10 mg total) by mouth at bedtime. What changed:  medication strength how much to take   rosuvastatin 10 MG tablet Commonly known as: CRESTOR TAKE 1 TABLET BY MOUTH EVERY DAY   Vitamin D (Cholecalciferol) 25 MCG (1000 UT) Tabs Take 1,000 tablets by mouth daily.        Allergies  Allergen Reactions   Mirtazapine Rash   Aspirin     Other reaction(s): Unknown Patient was instructed not to take aspirin but can not remember why   Atomoxetine Other (See Comments)    Urinary sx   Strattera [Atomoxetine Hcl] Other (See Comments)    Urinary sx   Xyzal [Levocetirizine Dihydrochloride] Rash    Patient reported on 11/02/17    Consultations: Psych  Nephro    Procedures/Studies: DG Chest 2 View  Result Date: 09/27/2022 CLINICAL DATA:  Cough and fever. EXAM: CHEST - 2 VIEW COMPARISON:  Radiograph 09/09/2022 FINDINGS: The cardiomediastinal contours are normal. Subsegmental scarring at the left lung base. Pulmonary vasculature is normal. No consolidation, pleural effusion, or pneumothorax. Multiple remote right rib fractures. No acute osseous abnormalities are seen. IMPRESSION: No acute airspace disease or explanation for symptoms. Subsegmental scarring at the left lung base. Electronically Signed   By: Narda Rutherford M.D.   On: 09/27/2022 15:20   DG Knee 1-2 Views Right  Result Date: 09/26/2022 CLINICAL DATA:  Status post fall.  Right knee pain. EXAM: RIGHT KNEE - 1-2 VIEW COMPARISON:  None Available. FINDINGS: Trace suprapatellar joint effusion. No acute fracture or dislocation. Patellofemoral and medial compartment joint space narrowing and marginal spur formation with sharpening of the tibial  spines. Soft tissues scratch there is mild medial soft tissue edema. IMPRESSION: 1. Trace suprapatellar joint effusion. No acute fracture or dislocation. 2. Mild medial soft tissue edema. 3. Patellofemoral and medial compartment osteoarthritis. Electronically Signed   By: Signa Kell M.D.   On: 09/26/2022 06:12   PERIPHERAL VASCULAR CATHETERIZATION  Result Date: 09/21/2022 See surgical note for result.  DG Pelvis 1-2 Views  Result Date: 09/19/2022 CLINICAL DATA:  Fall EXAM: PELVIS - 1-2 VIEW COMPARISON:  None Available. FINDINGS: Normal alignment. No acute fracture or dislocation. Joint spaces are preserved. Penile prosthesis partially visualized with a reservoir noted within the right hemipelvis. IMPRESSION: 1. No acute fracture or dislocation. Electronically Signed   By: Helyn Numbers M.D.   On: 09/19/2022 01:40   DG Chest 1 View  Result Date: 09/09/2022 CLINICAL DATA:  Chronic kidney disease. EXAM: CHEST  1 VIEW COMPARISON:  Chest  x-ray dated June 30, 2020. FINDINGS: The heart size and mediastinal contours are within normal limits. Normal pulmonary vascularity. No focal consolidation, pleural effusion, or pneumothorax. No acute osseous abnormality. Old bilateral rib fractures again noted. IMPRESSION: No active disease. Electronically Signed   By: Obie Dredge M.D.   On: 09/09/2022 13:00   (Echo, Carotid, EGD, Colonoscopy, ERCP)    Subjective: Pt c/o depression.    Discharge Exam: Vitals:   09/30/22 0728 09/30/22 1153  BP: (!) 119/100 (!) 101/55  Pulse: 91 81  Resp: 18 18  Temp: 98 F (36.7 C) 97.6 F (36.4 C)  SpO2: 100% 100%   Vitals:   09/30/22 0452 09/30/22 0728 09/30/22 1153 09/30/22 1158  BP: 108/61 (!) 119/100 (!) 101/55   Pulse: 81 91 81   Resp: 16 18 18    Temp: 98 F (36.7 C) 98 F (36.7 C) 97.6 F (36.4 C)   TempSrc: Oral  Oral   SpO2: 99% 100% 100%   Weight:    80.5 kg  Height:        General: Pt is alert, awake, not in acute distress Cardiovascular:  S1/S2 +, no rubs, no gallops Respiratory: CTA bilaterally, no wheezing, no rhonchi Abdominal: Soft, NT, ND, bowel sounds + Extremities: no edema, no cyanosis    The results of significant diagnostics from this hospitalization (including imaging, microbiology, ancillary and laboratory) are listed below for reference.     Microbiology: Recent Results (from the past 240 hour(s))  Culture, blood (x 2)     Status: None (Preliminary result)   Collection Time: 09/26/22  6:03 AM   Specimen: BLOOD RIGHT ARM  Result Value Ref Range Status   Specimen Description BLOOD RIGHT ARM  Final   Special Requests   Final    BOTTLES DRAWN AEROBIC AND ANAEROBIC Blood Culture adequate volume   Culture   Final    NO GROWTH 4 DAYS Performed at [facility], [address], [place], [place] [address]    Report Status PENDING  Incomplete  Culture, blood (x 2)     Status: None (Preliminary result)   Collection Time: 09/26/22 11:36 AM   Specimen: BLOOD  Result Value Ref Range Status   Specimen Description BLOOD RIGHT ANTECUBITAL  Final   Special Requests   Final    BOTTLES DRAWN AEROBIC AND ANAEROBIC Blood Culture adequate volume   Culture   Final    NO GROWTH 4 DAYS Performed at [facility], [address] [place], [place] [address]    Report Status PENDING  Incomplete     Labs: BNP (last 3 results) No results for input(s): "BNP" in the last 8760 hours. Basic Metabolic Panel: Recent Labs  Lab 09/24/22 0837 09/26/22 0603 09/30/22 0353  NA 138 133* 137  K 3.9 3.5 3.8  CL 103 96* 99  CO2 23 26 27   GLUCOSE 105* 88 96  BUN 77* 64* 59*  CREATININE 5.94* 4.96* 5.06*  CALCIUM 9.0 8.8* 9.0  PHOS 4.4  --   --    Liver Function Tests: Recent Labs  Lab 09/24/22 0837 09/26/22 0603  AST  --  31  ALT  --  15  ALKPHOS  --  99  BILITOT  --  0.7  PROT  --  6.1*  ALBUMIN 3.6 3.3*   No results for input(s): "LIPASE", "AMYLASE" in the last 168 hours. No results for input(s):  "AMMONIA" in the last 168 hours. CBC: Recent Labs  Lab 09/24/22 [phone] 09/26/22 [phone]  09/30/22 0353  WBC 4.7 5.3 3.1*  NEUTROABS  --  4.2  --   HGB 9.7* 9.2* 9.4*  HCT 28.4* 26.4* 26.5*  MCV 103.6* 103.5* 101.1*  PLT 117* 114* 137*   Cardiac Enzymes: No results for input(s): "CKTOTAL", "CKMB", "CKMBINDEX", "TROPONINI" in the last 168 hours. BNP: Invalid input(s): "POCBNP" CBG: Recent Labs  Lab 09/26/22 0257  GLUCAP 114*   D-Dimer No results for input(s): "DDIMER" in the last 72 hours. Hgb A1c No results for input(s): "HGBA1C" in the last 72 hours. Lipid Profile No results for input(s): "CHOL", "HDL", "LDLCALC", "TRIG", "CHOLHDL", "LDLDIRECT" in the last 72 hours. Thyroid function studies No results for input(s): "TSH", "T4TOTAL", "T3FREE", "THYROIDAB" in the last 72 hours.  Invalid input(s): "FREET3" Anemia work up Recent Labs    09/28/22 0603  VITAMINB12 273   Urinalysis    Component Value Date/Time   COLORURINE YELLOW (A) 08/01/2019 1824   APPEARANCEUR Clear 05/12/2022 1039   LABSPEC 1.011 08/01/2019 1824   PHURINE 5.0 08/01/2019 1824   GLUCOSEU Negative 05/12/2022 1039   HGBUR NEGATIVE 08/01/2019 1824   BILIRUBINUR Negative 05/12/2022 1039   KETONESUR NEGATIVE 08/01/2019 1824   PROTEINUR 2+ (A) 05/12/2022 1039   PROTEINUR 30 (A) 08/01/2019 1824   NITRITE Negative 05/12/2022 1039   NITRITE NEGATIVE 08/01/2019 1824   LEUKOCYTESUR Trace (A) 05/12/2022 1039   LEUKOCYTESUR NEGATIVE 08/01/2019 1824   Sepsis Labs Recent Labs  Lab 09/24/22 0837 09/26/22 0603 09/30/22 0353  WBC 4.7 5.3 3.1*   Microbiology Recent Results (from the past 240 hour(s))  Culture, blood (x 2)     Status: None (Preliminary result)   Collection Time: 09/26/22  6:03 AM   Specimen: BLOOD RIGHT ARM  Result Value Ref Range Status   Specimen Description BLOOD RIGHT ARM  Final   Special Requests   Final    BOTTLES DRAWN AEROBIC AND ANAEROBIC Blood Culture adequate volume   Culture    Final    NO GROWTH 4 DAYS Performed at [facility], [address] [place], [place] [address]    Report Status PENDING  Incomplete  Culture, blood (x 2)     Status: None (Preliminary result)   Collection Time: 09/26/22 11:36 AM   Specimen: BLOOD  Result Value Ref Range Status   Specimen Description BLOOD RIGHT ANTECUBITAL  Final   Special Requests   Final    BOTTLES DRAWN AEROBIC AND ANAEROBIC Blood Culture adequate volume   Culture   Final    NO GROWTH 4 DAYS Performed at [facility], [address] [place], [place] [address]    Report Status PENDING  Incomplete     Time coordinating discharge: Over 30 minutes  SIGNED:   Charise Killian, MD  [facility] 09/30/2022, 1:11 PM Pager   If 7PM-7AM, please contact night-coverage [email or web address]

## 2022-09-30 NOTE — Progress Notes (Signed)
Pt to dialysis.

## 2022-09-30 NOTE — Tx Team (Signed)
Initial Treatment Plan 09/30/2022 3:45 PM Luis Miller. WUJ:[id number]    PATIENT STRESSORS: Health problems     PATIENT STRENGTHS: Ability for insight  Average or above average intelligence  Capable of independent living  General fund of knowledge  Supportive family/friends    PATIENT IDENTIFIED PROBLEMS: Lack of ability to take meds at home  Suicidal ideation  Lack of support                 DISCHARGE CRITERIA:  Ability to meet basic life and health needs Adequate post-discharge living arrangements Improved stabilization in mood, thinking, and/or behavior Medical problems require only outpatient monitoring Motivation to continue treatment in a less acute level of care Need for constant or close observation no longer present Reduction of life-threatening or endangering symptoms to within safe limits Safe-care adequate arrangements made Verbal commitment to aftercare and medication compliance  PRELIMINARY DISCHARGE PLAN:   PATIENT/FAMILY INVOLVEMENT: This treatment plan has been presented to and reviewed with the patient, Luis Miller.,  The patient and family have been given the opportunity to ask questions and make suggestions.  Bonnita Hollow, RN 09/30/2022, 3:45 PM

## 2022-09-30 NOTE — Care Management Important Message (Signed)
Important Message  Patient Details  Name: Luis Miller. MRN: [id number] Date of Birth: 05/07/1945   Medicare Important Message Given:  N/A - LOS <3 / Initial given by admissions     [name] A Alexiz Cothran 09/30/2022, 8:42 AM

## 2022-09-30 NOTE — Progress Notes (Signed)
Report given to [facility]. Patient finishing lunch and then transporting down with RN and security.

## 2022-09-30 NOTE — TOC Transition Note (Signed)
Transition of Care [facility]) - CM/SW Discharge Note   Patient Details  Name: Luis Miller. MRN: [id number] Date of Birth: 03-07-1946  [facility] CM/SW Contact:  Allena Katz, LCSW Phone Number: 09/30/2022, 10:40 AM   Clinical Narrative:   Pt discharging to [facility]. CSW signing off.           Patient Goals and CMS Choice      Discharge Placement                         Discharge Plan and Services Additional resources added to the After Visit Summary for                                       Social Determinants of Health (SDOH) Interventions SDOH Screenings   Food Insecurity: No Food Insecurity (07/30/2022)  Housing: Patient Declined (07/30/2022)  Transportation Needs: No Transportation Needs (07/30/2022)  Utilities: Not At Risk (07/30/2022)  Alcohol Screen: Low Risk  (07/31/2022)  Depression (PHQ2-9): Low Risk  (07/26/2022)  Financial Resource Strain: Low Risk  (12/07/2021)  Physical Activity: Insufficiently Active (12/07/2021)  Social Connections: Moderately Integrated (12/07/2021)  Stress: No Stress Concern Present (12/07/2021)  Tobacco Use: Medium Risk (09/28/2022)     Readmission Risk Interventions     No data to display

## 2022-09-30 NOTE — Progress Notes (Deleted)
Patient to dialysis in stable condition via transport team with 1:1 sitter

## 2022-09-30 NOTE — Group Note (Signed)
Recreation Therapy Group Note   Group Topic:Healthy Support Systems  Group Date: 09/30/2022 Start Time: 1400 End Time: 1445 Facilitators: Rosina Lowenstein, LRT, CTRS Location: Courtyard  Group Description: Emotional Check in. Patient sat and talked with LRT about how they are doing and whatever else is on their mind. LRT provided active listening, reassurance and encouragement. Group was held outside in the courtyard to get fresh air and sunlight. Music was playing in the background.    Goal Area(s) Addressed: Patient will engage in conversation with LRT. Patient will communicate their wants, needs, or questions.  Patient will practice a new coping skill of "talking to someone".   Affect/Mood: N/A   Participation Level: Did not attend    Clinical Observations/Individualized Feedback: [name] did not attend group due to not being on the unit yet.  Plan: Continue to engage patient in RT group sessions 2-3x/week.   Rosina Lowenstein, LRT, CTRS 09/30/2022 3:35 PM

## 2022-09-30 NOTE — Progress Notes (Signed)
Pt down for dialysis.  Pt was cannulated x 2 15g needles and clots pulled throughout needle.  Pts access also starting to swell.  NP made aware and will try again for tx tomorrow.  Floor nurse given report and advised to ice pts arm throughout day.  Pt being transferred back to behavioral health today and asked nurse to give this report to oncoming [facility] nurse.  Also instructed pt to ice arm or remind nurse to give him ice pack for access arm.

## 2022-10-01 LAB — CBC
HCT: 27.4 % — ABNORMAL LOW (ref 39.0–52.0)
Hemoglobin: 9.7 g/dL — ABNORMAL LOW (ref 13.0–17.0)
MCH: 35.9 pg — ABNORMAL HIGH (ref 26.0–34.0)
MCHC: 35.4 g/dL (ref 30.0–36.0)
MCV: 101.5 fL — ABNORMAL HIGH (ref 80.0–100.0)
Platelets: 160 10*3/uL (ref 150–400)
RBC: 2.7 MIL/uL — ABNORMAL LOW (ref 4.22–5.81)
RDW: 14.5 % (ref 11.5–15.5)
WBC: 3.9 10*3/uL — ABNORMAL LOW (ref 4.0–10.5)
nRBC: 0 % (ref 0.0–0.2)

## 2022-10-01 LAB — RENAL FUNCTION PANEL
Albumin: 3.7 g/dL (ref 3.5–5.0)
Anion gap: 12 (ref 5–15)
BUN: 82 mg/dL — ABNORMAL HIGH (ref 8–23)
CO2: 24 mmol/L (ref 22–32)
Calcium: 9.2 mg/dL (ref 8.9–10.3)
Chloride: 101 mmol/L (ref 98–111)
Creatinine, Ser: 6.25 mg/dL — ABNORMAL HIGH (ref 0.61–1.24)
GFR, Estimated: 9 mL/min — ABNORMAL LOW (ref >60)
Glucose, Bld: 135 mg/dL — ABNORMAL HIGH (ref 70–99)
Phosphorus: 4.5 mg/dL (ref 2.5–4.6)
Potassium: 4 mmol/L (ref 3.5–5.1)
Sodium: 137 mmol/L (ref 135–145)

## 2022-10-01 LAB — CULTURE, BLOOD (ROUTINE X 2)
Culture: NO GROWTH
Culture: NO GROWTH
Special Requests: ADEQUATE
Special Requests: ADEQUATE

## 2022-10-01 MED ORDER — EPOETIN ALFA 4000 UNIT/ML IJ SOLN
4000.0000 [IU] | INTRAMUSCULAR | Status: DC
  Administered 2022-10-01 – 2022-10-05 (×2): 4000 [IU] via INTRAVENOUS
  Filled 2022-10-01 (×3): qty 1

## 2022-10-01 MED ORDER — EPOETIN ALFA 4000 UNIT/ML IJ SOLN
INTRAMUSCULAR | Status: AC
  Filled 2022-10-01: qty 1
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
## 2022-10-01 NOTE — Progress Notes (Signed)
[facility]  Dialysis Note   Subjective:   Patient seen and evaluated during dialysis   HEMODIALYSIS FLOWSHEET:  Blood Flow Rate (mL/min): 350 mL/min Arterial Pressure (mmHg): -190 mmHg Venous Pressure (mmHg): 150 mmHg TMP (mmHg): 15 mmHg Ultrafiltration Rate (mL/min): 543 mL/min Dialysate Flow Rate (mL/min): 300 ml/min  Unable to successfully acces HD access during treatment yesterday  Access well today, treatment in progress  Objective:  Vital signs in last 24 hours:  Temp:  [97.3 F (36.3 C)-98.4 F (36.9 C)] 97.6 F (36.4 C) (07/12 1232) Pulse Rate:  [68-109] 68 (07/12 1300) Resp:  [16-18] 16 (07/12 1300) BP: (92-145)/(59-79) 92/72 (07/12 1330) SpO2:  [95 %-100 %] 99 % (07/12 1330) Weight:  [80.5 kg-81.9 kg] 81.9 kg (07/12 1256)  Weight change:  Filed Weights   09/30/22 1500 10/01/22 1256  Weight: 80.5 kg 81.9 kg    Intake/Output: No intake/output data recorded.   Intake/Output this shift:  No intake/output data recorded.  Physical Exam: General: NAD  Head: Normocephalic, atraumatic. Moist oral mucosal membranes  Eyes: Anicteric  Lungs:  Clear to auscultation  Heart: Regular rate and rhythm  Abdomen:  Soft, nontender  Extremities: No peripheral edema.  Neurologic: Alert, moving all four extremities  Skin: No lesions  Access: Lt AVF    Basic Metabolic Panel: Recent Labs  Lab 09/26/22 0603 09/30/22 0353 10/01/22 1257  NA 133* 137 137  K 3.5 3.8 4.0  CL 96* 99 101  CO2 26 27 24   GLUCOSE 88 96 135*  BUN 64* 59* 82*  CREATININE 4.96* 5.06* 6.25*  CALCIUM 8.8* 9.0 9.2  PHOS  --   --  4.5    Liver Function Tests: Recent Labs  Lab 09/26/22 0603 10/01/22 1257  AST 31  --   ALT 15  --   ALKPHOS 99  --   BILITOT 0.7  --   PROT 6.1*  --   ALBUMIN 3.3* 3.7   No results for input(s): "LIPASE", "AMYLASE" in the last 168 hours. No results for input(s): "AMMONIA" in the last 168 hours.  CBC: Recent Labs  Lab 09/26/22 0603  09/30/22 0353 10/01/22 1257  WBC 5.3 3.1* 3.9*  NEUTROABS 4.2  --   --   HGB 9.2* 9.4* 9.7*  HCT 26.4* 26.5* 27.4*  MCV 103.5* 101.1* 101.5*  PLT 114* 137* 160    Cardiac Enzymes: No results for input(s): "CKTOTAL", "CKMB", "CKMBINDEX", "TROPONINI" in the last 168 hours.  BNP: Invalid input(s): "POCBNP"  CBG: Recent Labs  Lab 09/26/22 0257  GLUCAP 114*    Microbiology: Results for orders placed or performed during the hospital encounter of 09/26/22  Culture, blood (x 2)     Status: None   Collection Time: 09/26/22 11:36 AM   Specimen: BLOOD  Result Value Ref Range Status   Specimen Description BLOOD RIGHT ANTECUBITAL  Final   Special Requests   Final    BOTTLES DRAWN AEROBIC AND ANAEROBIC Blood Culture adequate volume   Culture   Final    NO GROWTH 5 DAYS Performed at [facility], [address] [place], [place] [address]    Report Status 10/01/2022 FINAL  Final    Coagulation Studies: No results for input(s): "LABPROT", "INR" in the last 72 hours.  Urinalysis: No results for input(s): "COLORURINE", "LABSPEC", "PHURINE", "GLUCOSEU", "HGBUR", "BILIRUBINUR", "KETONESUR", "PROTEINUR", "UROBILINOGEN", "NITRITE", "LEUKOCYTESUR" in the last 72 hours.  Invalid input(s): "APPERANCEUR"    Imaging: No results found.   Medications:  amLODipine  10 mg Oral Daily   benazepril  40 mg Oral Daily   buPROPion  150 mg Oral Daily   calcitRIOL  0.25 mcg Oral Daily   cholecalciferol  1,000 Units Oral Daily   doxepin  100 mg Oral QHS   hydrALAZINE  100 mg Oral TID   lamoTRIgine  25 mg Oral [id number]   loratadine  10 mg Oral Daily   multivitamin with minerals  1 tablet Oral Daily   OLANZapine  10 mg Oral QHS   rosuvastatin  10 mg Oral Daily   loperamide, magnesium hydroxide  Assessment/ Plan:  [name]. is a 77 y.o.  male white male with end stage renal disease on hemodialysis, hypertension, gout, depression, history of melanoma, crohn's,  and CVA who presents to [facility] on 07/30/2022 for Bipolar depression (HCC) [F31.9]   Principal Problem:   Bipolar depression (HCC) End-stage renal disease.  **Due to extended admission, patient will need to be re-established with outpatient clinic prior to discharge. Please contact renal navigator for discharge updates**   End Stage Renal Disease on hemodialysis:  Unable to cannulate access, staff pulling thrmobus. Treatment aborted and patient returned today. Arterial cannulation remains difficult but treatment progressing well with decreased BFR. Next treatment scheduled for Saturday.    [facility] vascular lab)  Date Value Ref Range Status  09/28/2021 3.6 3.5 - 5.1 mmol/L Final    Comment:    Performed at [facility], [address] [place], [place] [address]   No intake or output data in the 24 hours ending 10/01/22 1355  2. Hypertension with chronic kidney disease: Maintain the patient on benazepril at this time. BP 92/72 (BP Location: Right Arm)   Pulse 68   Temp 97.6 F (36.4 C) (Oral)   Resp 16   Ht 5\' 6"  (1.676 m)   Wt 81.9 kg   SpO2 99%   BMI 29.14 kg/m   Blood pressure 99/68 during dialysis  3. Anemia of chronic kidney disease/ kidney injury/chronic disease/acute blood loss:   Lab Results  Component Value Date   HGB 9.7 (L) 10/01/2022  Hgb 9.7 Continue Epogen 4000 units IV with dialysis.   4. Secondary Hyperparathyroidism:     Lab Results  Component Value Date   CALCIUM 9.2 10/01/2022   CAION 1.17 11/05/2021   PHOS 4.5 10/01/2022  Will continue to monitor bone minerals during this admission. Continue daily calcitriol    LOS: 1 [facility] 7/12/20241:55 PM

## 2022-10-01 NOTE — Progress Notes (Signed)
Patient present in the dayroom for breakfast.  Flat affect.  Patient is pleasant and cooperative.  Reports he feels "the same."  Voices concerns about having to go home and live alone.  Endorses anxiety and depression.  Medications  administered after dialysis.  15 min checks in place for safety.  Patient present in the milieu. Appropriate interaction with peers.   Patient will have dialysis again tomorrow 7/13.

## 2022-10-01 NOTE — Group Note (Signed)
Recreation Therapy Group Note   Group Topic:Health and Wellness  Group Date: 10/01/2022 Start Time: 1400 End Time: 1445 Facilitators: Rosina Lowenstein, LRT, CTRS Location:  Day Room  Group Description: Seated Exercise. LRT discussed the mental and physical benefits of exercise. LRT and group discussed how physical activity can be used as a coping skill. Pt's and LRT followed along to an exercise video on the TV screen that provided a visual representation and audio description of every exercise performed. Pt's encouraged to listen to their bodies and stop at any time if they experience feelings of discomfort or pain. LRT passed out water after session was over and encouraged pts do drink and stay hydrated.  Goal Area(s) Addressed: Patient will learn benefits of physical activity. Patient will identify exercise as a coping skill.  Patient will follow multistep directions. Patient will try a new leisure interest.   Affect/Mood: N/A   Participation Level: Did not attend    Clinical Observations/Individualized Feedback: [name] did not attend group due to being at dialysis.   Plan: Continue to engage patient in RT group sessions 2-3x/week.   Rosina Lowenstein, LRT, CTRS 10/01/2022 3:14 PM

## 2022-10-01 NOTE — B[facility] Counselor (Signed)
Treatment team did not occur today as scheduled as directed by MD.   Reynaldo Minium, MSW, [facility] 10/01/2022 4:29 PM

## 2022-10-01 NOTE — Discharge Planning (Signed)
Established hemodialysis patient known at [facility]. Patient has not received a treatment at facility since the beginning of May. Due to this extended stay, before patient is discharged back to clinic, they will require updated records and reestablish a chair time.  Please update me when patient is nearing discharge, so the clinic will be updated and new chair time given.  Dimas Chyle Dialysis Coordinator II  [facility] Cell: [phone] eFax: [phone] [email or web address]

## 2022-10-01 NOTE — Group Note (Signed)
Date:  10/01/2022 Time:  12:12 PM  Group Topic/Focus:  Rediscovering Joy:   The focus of this group is to explore various ways to relieve stress in a positive manner.    Participation Level:  Active  Participation Quality:  Appropriate  Affect:  Appropriate  Cognitive:  Alert and Appropriate  Insight: Appropriate  Engagement in Group:  Engaged  Modes of Intervention:  Activity and Discussion  Additional Comments:    Marta Antu 10/01/2022, 12:12 PM

## 2022-10-01 NOTE — Progress Notes (Signed)
   10/01/22 2300  Psych Admission Type (Psych Patients Only)  Admission Status Voluntary  Psychosocial Assessment  Patient Complaints Anxiety  Eye Contact Fair  Facial Expression Flat  Affect Sad;Anxious  Speech Logical/coherent  Interaction Assertive  Motor Activity Slow  Appearance/Hygiene Unremarkable  Behavior Characteristics Cooperative;Appropriate to situation  Mood Anxious;Sad  Thought Process  Coherency WDL  Content WDL  Delusions None reported or observed  Perception WDL  Hallucination None reported or observed  Judgment Impaired  Confusion None  Danger to Self  Current suicidal ideation? Denies  Self-Injurious Behavior No self-injurious ideation or behavior indicators observed or expressed   Agreement Not to Harm Self Yes  Description of Agreement verbal  Danger to Others  Danger to Others None reported or observed

## 2022-10-01 NOTE — Plan of Care (Signed)

## 2022-10-01 NOTE — Progress Notes (Signed)
  Received patient in bed to unit.   Informed consent signed and in chart.    TX duration:3hrs ( per NP)     Transported back to floor  Hand-off given to patient's nurse. No c/o and no distress noted    Access used: L AVG Access issues: none   Total UF removed: 1.0L Medication(s) given: 4,000u epoogen Post HD VS: 127/74 Post HD weight: 80.6kg     Lynann Beaver  [facility]

## 2022-10-02 DIAGNOSIS — F314 Bipolar disorder, current episode depressed, severe, without psychotic features: Principal | ICD-10-CM

## 2022-10-02 LAB — CBC
HCT: 29.2 % — ABNORMAL LOW (ref 39.0–52.0)
Hemoglobin: 10.4 g/dL — ABNORMAL LOW (ref 13.0–17.0)
MCH: 35.7 pg — ABNORMAL HIGH (ref 26.0–34.0)
MCHC: 35.6 g/dL (ref 30.0–36.0)
MCV: 100.3 fL — ABNORMAL HIGH (ref 80.0–100.0)
Platelets: 155 10*3/uL (ref 150–400)
RBC: 2.91 MIL/uL — ABNORMAL LOW (ref 4.22–5.81)
RDW: 14.1 % (ref 11.5–15.5)
WBC: 4.2 10*3/uL (ref 4.0–10.5)
nRBC: 0 % (ref 0.0–0.2)

## 2022-10-02 LAB — RENAL FUNCTION PANEL
Albumin: 3.7 g/dL (ref 3.5–5.0)
Anion gap: 13 (ref 5–15)
BUN: 54 mg/dL — ABNORMAL HIGH (ref 8–23)
CO2: 25 mmol/L (ref 22–32)
Calcium: 9.2 mg/dL (ref 8.9–10.3)
Chloride: 96 mmol/L — ABNORMAL LOW (ref 98–111)
Creatinine, Ser: 4.89 mg/dL — ABNORMAL HIGH (ref 0.61–1.24)
GFR, Estimated: 12 mL/min — ABNORMAL LOW (ref >60)
Glucose, Bld: 160 mg/dL — ABNORMAL HIGH (ref 70–99)
Phosphorus: 3.6 mg/dL (ref 2.5–4.6)
Potassium: 3.6 mmol/L (ref 3.5–5.1)
Sodium: 134 mmol/L — ABNORMAL LOW (ref 135–145)

## 2022-10-02 MED ORDER — EPOETIN ALFA 4000 UNIT/ML IJ SOLN
INTRAMUSCULAR | Status: AC
  Filled 2022-10-02: qty 1

## 2022-10-02 NOTE — Progress Notes (Signed)
   10/02/22 1400  Psych Admission Type (Psych Patients Only)  Admission Status Voluntary  Psychosocial Assessment  Patient Complaints Hopelessness  Eye Contact Fair  Facial Expression Flat  Affect Sad  Speech Logical/coherent  Interaction Assertive  Motor Activity Slow  Appearance/Hygiene Unremarkable  Behavior Characteristics Cooperative  Mood Sad  Thought Process  Coherency WDL  Content WDL  Delusions None reported or observed  Perception WDL  Hallucination None reported or observed  Judgment Impaired  Confusion None  Danger to Self  Current suicidal ideation? Denies  Agreement Not to Harm Self Yes  Description of Agreement verbal

## 2022-10-02 NOTE — Group Note (Signed)
Date:  10/02/2022 Time:  12:35 PM  Group Topic/Focus:  Coping With Mental Health Crisis:   The purpose of this group is to help patients identify strategies for coping with mental health crisis.  Group discusses possible causes of crisis and ways to manage them effectively.    Participation Level:  Did Not Attend  Participation Quality:    Affect:    Cognitive:    Insight:   Engagement in Group:    Modes of Intervention:    Additional Comments:    Luis Miller 10/02/2022, 12:35 PM

## 2022-10-02 NOTE — Progress Notes (Signed)
[facility]  Dialysis Note   Subjective:   Patient seen and evaluated during dialysis   HEMODIALYSIS FLOWSHEET:  Blood Flow Rate (mL/min): 400 mL/min Arterial Pressure (mmHg): -240 mmHg Venous Pressure (mmHg): 240 mmHg TMP (mmHg): 9 mmHg Ultrafiltration Rate (mL/min): 0 mL/min (UF paused for BP support) Dialysate Flow Rate (mL/min): 300 ml/min Bolus Amount (mL): 100 mL  Hemodialysis treatment today and yesterday. Hypotensive on treatment. Patient states he is not eating much. Reduced UF goal.   Objective:  Vital signs in last 24 hours:  Temp:  [97.6 F (36.4 C)-98.2 F (36.8 C)] 98.1 F (36.7 C) (07/13 0940) Pulse Rate:  [66-88] 77 (07/13 1115) Resp:  [14-19] 15 (07/13 1115) BP: (89-130)/(56-88) 89/56 (07/13 1115) SpO2:  [98 %-100 %] 99 % (07/13 1115) Weight:  [80.6 kg-81.9 kg] 80.8 kg (07/13 0939)  Weight change: 1.387 kg Filed Weights   10/01/22 1256 10/01/22 1630 10/02/22 0939  Weight: 81.9 kg 80.6 kg 80.8 kg    Intake/Output: I/O last 3 completed shifts: In: -  Out: 1000 [Other:1000]   Intake/Output this shift:  No intake/output data recorded.  Physical Exam: General: NAD, seated in chair  Head: Normocephalic, atraumatic. Moist oral mucosal membranes  Eyes: Anicteric  Lungs:  Clear to auscultation  Heart: Regular rate and rhythm  Abdomen:  Soft, nontender  Extremities: No peripheral edema.  Neurologic: Alert, moving all four extremities  Skin: No lesions  Access: Lt AVF    Basic Metabolic Panel: Recent Labs  Lab 09/26/22 0603 09/30/22 0353 10/01/22 1257 10/02/22 0956  NA 133* 137 137 134*  K 3.5 3.8 4.0 3.6  CL 96* 99 101 96*  CO2 26 27 24 25   GLUCOSE 88 96 135* 160*  BUN 64* 59* 82* 54*  CREATININE 4.96* 5.06* 6.25* 4.89*  CALCIUM 8.8* 9.0 9.2 9.2  PHOS  --   --  4.5 3.6    Liver Function Tests: Recent Labs  Lab 09/26/22 0603 10/01/22 1257 10/02/22 0956  AST 31  --   --   ALT 15  --   --   ALKPHOS 99  --   --    BILITOT 0.7  --   --   PROT 6.1*  --   --   ALBUMIN 3.3* 3.7 3.7   No results for input(s): "LIPASE", "AMYLASE" in the last 168 hours. No results for input(s): "AMMONIA" in the last 168 hours.  CBC: Recent Labs  Lab 09/26/22 0603 09/30/22 0353 10/01/22 1257 10/02/22 0956  WBC 5.3 3.1* 3.9* 4.2  NEUTROABS 4.2  --   --   --   HGB 9.2* 9.4* 9.7* 10.4*  HCT 26.4* 26.5* 27.4* 29.2*  MCV 103.5* 101.1* 101.5* 100.3*  PLT 114* 137* 160 155    Cardiac Enzymes: No results for input(s): "CKTOTAL", "CKMB", "CKMBINDEX", "TROPONINI" in the last 168 hours.  BNP: Invalid input(s): "POCBNP"  CBG: Recent Labs  Lab 09/26/22 0257  GLUCAP 114*    Microbiology: Results for orders placed or performed in visit on 05/12/22  Microscopic Examination     Status: Abnormal   Collection Time: 05/12/22 10:39 AM   Urine  Result Value Ref Range Status   WBC, UA 6-10 (A) 0 - 5 /hpf Final   RBC, Urine 0-2 0 - 2 /hpf Final   Epithelial Cells (non renal) 0-10 0 - 10 /hpf Final   Bacteria, UA Moderate (A) None seen/Few Final    Coagulation Studies: No results for input(s): "LABPROT", "INR" in the last 72  hours.  Urinalysis: No results for input(s): "COLORURINE", "LABSPEC", "PHURINE", "GLUCOSEU", "HGBUR", "BILIRUBINUR", "KETONESUR", "PROTEINUR", "UROBILINOGEN", "NITRITE", "LEUKOCYTESUR" in the last 72 hours.  Invalid input(s): "APPERANCEUR"    Imaging: No results found.   Medications:      amLODipine  10 mg Oral Daily   benazepril  40 mg Oral Daily   buPROPion  150 mg Oral Daily   calcitRIOL  0.25 mcg Oral Daily   cholecalciferol  1,000 Units Oral Daily   doxepin  100 mg Oral QHS   epoetin (EPOGEN/PROCRIT) injection  4,000 Units Intravenous Q T,Th,Sa-HD   hydrALAZINE  100 mg Oral TID   lamoTRIgine  25 mg Oral [id number]   loratadine  10 mg Oral Daily   multivitamin with minerals  1 tablet Oral Daily   OLANZapine  10 mg Oral QHS   rosuvastatin  10 mg Oral Daily   loperamide,  magnesium hydroxide  Assessment/ Plan:  [name]. is a 77 y.o.  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  **Due to extended admission, patient will need to be re-established with outpatient clinic prior to discharge. Please contact renal navigator for discharge updates**   End Stage Renal Disease on hemodialysis: access cannulated with no issues this morning.     Potassium [facility] vascular lab)  Date Value Ref Range Status  09/28/2021 3.6 3.5 - 5.1 mmol/L Final    Comment:    Performed at [facility], [address] [place], [place] [address]    Intake/Output Summary (Last 24 hours) at 10/02/2022 1127 Last data filed at 10/01/2022 1600 Gross per 24 hour  Intake --  Output 1000 ml  Net -1000 ml    2. Hypotension with history of Hypertension with chronic kidney disease:  - discontinue amlodipine, continue benazepril.  - Continue to monitor blood pressures. .  BP (!) 89/56 (BP Location: Right Arm)   Pulse 77   Temp 98.1 F (36.7 C) (Oral)   Resp 15   Ht 5\' 6"  (1.676 m)   Wt 80.8 kg   SpO2 99%   BMI 28.75 kg/m    3. Anemia of chronic kidney disease/ kidney injury/chronic disease/acute blood loss:   Lab Results  Component Value Date   HGB 10.4 (L) 10/02/2022   Continue Epogen 4000 units IV with dialysis.   4. Secondary Hyperparathyroidism:     Lab Results  Component Value Date   CALCIUM 9.2 10/02/2022   CAION 1.17 11/05/2021   PHOS 3.6 10/02/2022   - Continue daily calcitriol - Continue cholecalciferol - Not currently on a phos binder.    LOS: 2 [facility] 7/13/202411:27 AM

## 2022-10-02 NOTE — Progress Notes (Signed)
Patient is in a session right now and will be brought to Dialysis unit after 15 mins.

## 2022-10-02 NOTE — Group Note (Signed)
Date:  10/02/2022 Time:  8:18 PM  Group Topic/Focus:  Conflict Resolution:   The focus of this group is to discuss the conflict resolution process and how it may be used upon discharge.    Participation Level:  Active  Participation Quality:  Appropriate  Affect:  Appropriate  Cognitive:  Appropriate  Insight: Appropriate  Engagement in Group:  Engaged  Modes of Intervention:  Education  Additional Comments:    Jeannette How 10/02/2022, 8:18 PM

## 2022-10-02 NOTE — Progress Notes (Signed)
   10/02/22 0600  15 Minute Checks  Location Bedroom  Visual Appearance Calm  Behavior Composed  Sleep (Behavioral Health Patients Only)  Calculate sleep? (Click Yes once per 24 hr at 0600 safety check) Yes  Documented sleep last 24 hours 8.25

## 2022-10-02 NOTE — Progress Notes (Signed)
   10/02/22 2000  Psych Admission Type (Psych Patients Only)  Admission Status Voluntary  Psychosocial Assessment  Patient Complaints Hopelessness;Worrying  Eye Contact Fair  Facial Expression Flat  Affect Sad;Flat  Speech Logical/coherent  Interaction Assertive  Motor Activity Slow  Appearance/Hygiene Unremarkable  Behavior Characteristics Cooperative  Mood Sad  Thought Process  Coherency WDL  Content WDL  Delusions None reported or observed  Perception WDL  Hallucination None reported or observed  Judgment Impaired  Confusion None  Danger to Self  Current suicidal ideation? Denies  Self-Injurious Behavior No self-injurious ideation or behavior indicators observed or expressed   Agreement Not to Harm Self Yes  Description of Agreement verbal  Danger to Others  Danger to Others None reported or observed

## 2022-10-02 NOTE — Group Note (Signed)
Date:  10/02/2022 Time:  3:04 PM  Group Topic/Focus:  Coping With Mental Health Crisis:   The purpose of this group is to help patients identify strategies for coping with mental health crisis.  Group discusses possible causes of crisis and ways to manage them effectively. Rediscovering Joy:   The focus of this group is to explore various ways to relieve stress in a positive manner.  We went outside for fresh air and relaxation!    Participation Level:  Did Not Attend  Participation Quality:    Affect:    Cognitive:    Insight:   Engagement in Group:    Modes of Intervention:    Additional Comments:    Parys Elenbaas L Rhet Rorke 10/02/2022, 3:04 PM

## 2022-10-02 NOTE — Plan of Care (Signed)
  Problem: Pain Managment: Goal: General experience of comfort will improve Outcome: Progressing   Problem: Safety: Goal: Ability to remain free from injury will improve Outcome: Progressing   Problem: Coping: Goal: Level of anxiety will decrease Outcome: Not Progressing   

## 2022-10-02 NOTE — Progress Notes (Addendum)
Hemodialysis note  Received patient in bed to unit. Alert and oriented.  Informed consent signed and in chart.  Treatment initiated: 0950 Treatment completed: 1336  Patient tolerated well. Transported back to room, alert without acute distress.  Report given to patient's RN.   Access used: LUA AVG Access issues: None  Total UF removed: 400 ml Medication(s) given:  None  Post HD weight: 80.3 kg   Wolfgang Phoenix [facility]
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
## 2022-10-02 NOTE — [facility] Counselor (Signed)
Adult Comprehensive Assessment  Patient ID: Luis Miller., male   DOB: June 08, 1945, 76 y.o.   MRN: [id number]  Information Source: Information source: Patient  Current Stressors:  Patient states their primary concerns and needs for treatment are:: Patient reported "I want to be like I was." Patient reported starting dialysis a year ago, being unable to balance his checkbook recently, and worrying about failing his DL renewal test coming up in August 2024...these were the "triggers" of his recent depression in May that brought him into the hospital. Patient states their goals for this hospitilization and ongoing recovery are:: Patient struggled to identify goals, seems to be grieving/fearing the loss of independence due to normal, age related, cognitive decline. Financial / Lack of resources (include bankruptcy): Patient reported fiancial worries regarding the cost of his hospital stay. Housing / Lack of housing: Patient worries about cooking/cleaning/yardwork when he returns home. Patient verbalized concern for being a burden on his son who lives in [place]. Physical health (include injuries & life threatening diseases): Patient verbalized concern over transportation for his dialysis once he goes home. Bereavement / Loss: Patient verbalized concerns/worries that point to grieving his loss of independence.  Living/Environment/Situation:  Living Arrangements: Alone  Family History:     Childhood History:     Education:     Employment/Work Situation:      Architect:      Alcohol/Substance Abuse:      Social Support System:      Leisure/Recreation:      Strengths/Needs:   Patient states these barriers may affect their return to the community: Transportation, ability to care for himself, ability to take the correct medications. Other important information patient would like considered in planning for their treatment: Discussed returing home, patient stated he,  "does not like being alone." Discussed other living situations like assisted living or a retirement community, patient stated, "I don't like people." Discussed how a retirement community gets rid of his "worries" of not being able to maintain his home, not being a burden to his son, transportation for dialysis, and assistance with taking the correct medications. Further discussed that participation in activities is purely voluntary, that he can see people as much or as little as he chooses.  Discharge Plan:   Does patient have access to transportation?:  (10/02/2022 Patient is worried he will lose his DL in August if he can't pass the test.)  Summary/Recommendations:   Summary and Recommendations (to be completed by the evaluator): 10/02/2022 The patient reported that "worries" were the trigger to his depression in May; not being able to balance his check book, afraid of failing his upcoming DL renewal test in August, not being able to get himself to his dialysis apointments, becoming a burden to his son. Discussed returning home and patient stated, "I don't want to be alone" discussed alternative living situations that would reduce his "worries" like a retirement center and patient stated, "I don't like people." Discussed grieving the loss of independence and making peace with the aging process.Recommendations include: crisis stabilization, therapeutic milieu, encourage group attendance and participation, medication management for mood stabilization, and development of a comprehensive mental wellness plan.  Marshell Levan. 10/02/2022
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
## 2022-10-03 NOTE — Progress Notes (Signed)
   10/03/22 0615  15 Minute Checks  Location Bedroom  Visual Appearance Calm  Behavior Composed  Sleep (Behavioral Health Patients Only)  Calculate sleep? (Click Yes once per 24 hr at 0600 safety check) Yes  Documented sleep last 24 hours 7.5

## 2022-10-03 NOTE — Group Note (Signed)
Date:  10/03/2022 Time:  11:24 AM  Group Topic/Focus:  Coping With Mental Health Crisis:   The purpose of this group is to help patients identify strategies for coping with mental health crisis.  Group discusses possible causes of crisis and ways to manage them effectively. Healthy Communication:   The focus of this group is to discuss communication, barriers to communication, as well as healthy ways to communicate with others. Self Care:   The focus of this group is to help patients understand the importance of self-care in order to improve or restore emotional, physical, spiritual, interpersonal, and financial health.    Participation Level:  Active  Participation Quality:  Appropriate  Affect:  Appropriate  Cognitive:  Appropriate  Insight: Appropriate  Engagement in Group:  Engaged  Modes of Intervention:  Discussion  Additional Comments:     Brieanna Nau L Dameer Speiser 10/03/2022, 11:24 AM

## 2022-10-03 NOTE — Progress Notes (Signed)
Patient is alert and oriented. Affect sad. Mood sullen. Appetite good. Pain 0/10. He participated in both groups and communicated well with social work and other staff.  No PRN's requested. No distress noted.  Q15 minute unit checks in place.

## 2022-10-03 NOTE — Progress Notes (Signed)
   10/03/22 2000  Psych Admission Type (Psych Patients Only)  Admission Status Voluntary  Psychosocial Assessment  Patient Complaints Hopelessness  Eye Contact Fair  Facial Expression Flat  Affect Sad  Speech Logical/coherent  Interaction Assertive  Motor Activity Slow  Appearance/Hygiene Unremarkable  Behavior Characteristics Cooperative  Mood Sad  Thought Process  Coherency WDL  Content WDL  Delusions None reported or observed  Perception WDL  Hallucination None reported or observed  Judgment Impaired  Confusion None  Danger to Self  Current suicidal ideation? Denies  Self-Injurious Behavior No self-injurious ideation or behavior indicators observed or expressed   Agreement Not to Harm Self Yes  Description of Agreement verbal  Danger to Others  Danger to Others None reported or observed

## 2022-10-03 NOTE — Plan of Care (Signed)
  Problem: Activity: Goal: Risk for activity intolerance will decrease Outcome: Progressing   Problem: Nutrition: Goal: Adequate nutrition will be maintained Outcome: Progressing   Problem: Coping: Goal: Level of anxiety will decrease Outcome: Progressing   

## 2022-10-03 NOTE — Group Note (Signed)
LCSW Group Therapy Note  Group Date: 10/03/2022 Start Time: 1255 End Time: 1330   Type of Therapy and Topic:  Group Therapy - Healthy vs Unhealthy Coping Skills  Participation Level:  Active   Description of Group The focus of this group was to determine what unhealthy coping techniques typically are used by group members and what healthy coping techniques would be helpful in coping with various problems. Patients were guided in becoming aware of the differences between healthy and unhealthy coping techniques. Patients were asked to identify 2-3 healthy coping skills they would like to learn to use more effectively.  Therapeutic Goals Patients learned that coping is what human beings do all day long to deal with various situations in their lives Patients defined and discussed healthy vs unhealthy coping techniques Patients identified their preferred coping techniques and identified whether these were healthy or unhealthy Patients determined 2-3 healthy coping skills they would like to become more familiar with and use more often. Patients provided support and ideas to each other   Summary of Patient Progress: Patient attended group. 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 Patient proved open to input from peers and feedback from CSW. Patient demonstrated fair insight into the subject matter, was respectful of peers, and participated throughout the entire session. Luis Miller reported experiencing frustration due to mental fog during the group therapy.   Therapeutic Modalities Cognitive Behavioral Therapy Motivational Interviewing  Luis Miller, [place] 10/03/2022  2:30 PM

## 2022-10-04 MED ORDER — HEPARIN SODIUM (PORCINE) 1000 UNIT/ML DIALYSIS: 25.0000 [IU]/kg | INTRAMUSCULAR | Status: DC | PRN

## 2022-10-04 MED ORDER — HEPARIN SODIUM (PORCINE) 1000 UNIT/ML DIALYSIS: 1000.0000 [IU] | INTRAMUSCULAR | Status: DC | PRN

## 2022-10-04 NOTE — Progress Notes (Signed)
   10/04/22 2200  Psych Admission Type (Psych Patients Only)  Admission Status Voluntary  Psychosocial Assessment  Patient Complaints None  Eye Contact Fair  Facial Expression Flat  Affect Sad  Speech Logical/coherent  Interaction Assertive  Motor Activity Slow  Appearance/Hygiene Unremarkable  Behavior Characteristics Cooperative  Mood Pleasant  Thought Process  Coherency WDL  Content WDL  Delusions None reported or observed  Perception WDL  Hallucination None reported or observed  Judgment Impaired  Confusion None  Danger to Self  Current suicidal ideation? Denies  Self-Injurious Behavior No self-injurious ideation or behavior indicators observed or expressed   Agreement Not to Harm Self Yes  Description of Agreement verbal  Danger to Others  Danger to Others None reported or observed

## 2022-10-04 NOTE — Group Note (Signed)
Date:  10/04/2022 Time:  8:45 PM  Group Topic/Focus:  Movie therapy     Participation Level:  Active  Participation Quality:  Appropriate  Affect:  Appropriate  Cognitive:  Alert  Insight: Appropriate  Engagement in Group:  Engaged  Modes of Intervention:  Exploration  Additional Comments:    Garry Heater 10/04/2022, 8:45 PM

## 2022-10-04 NOTE — Group Note (Signed)
LCSW Group Therapy Note  Group Date: 10/04/2022 Start Time: 1315 End Time: 1400   Type of Therapy and Topic:  Group Therapy - Healthy vs Unhealthy Coping Skills  Participation Level:  Minimal   Description of Group The focus of this group was to determine what unhealthy coping techniques typically are used by group members and what healthy coping techniques would be helpful in coping with various problems. Patients were guided in becoming aware of the differences between healthy and unhealthy coping techniques. Patients were asked to identify 2-3 healthy coping skills they would like to learn to use more effectively.  Therapeutic Goals Patients learned that coping is what human beings do all day long to deal with various situations in their lives Patients defined and discussed healthy vs unhealthy coping techniques Patients identified their preferred coping techniques and identified whether these were healthy or unhealthy Patients determined 2-3 healthy coping skills they would like to become more familiar with and use more often. Patients provided support and ideas to each other   Summary of Patient Progress:  During group, pt expressed concerns regarding coping skills needed for discharge. Patient proved open to input from peers and feedback from CSW. Patient demonstrated fair insight into the subject matter, was respectful of peers, and participated throughout the entire session.   Therapeutic Modalities Cognitive Behavioral Therapy Motivational Interviewing  Elza Rafter, [place] 10/04/2022  3:30 PM
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
## 2022-10-04 NOTE — B[facility] Counselor (Signed)
CSW contacted pt's son in order to confirm discharge plans. Son confirmed his father could be discharged tomorrow and he would come get him from the hospital. CSW will confirm homehealth appointments and make psychotherapy appointment per son's request.   Reynaldo Minium, MSW, [facility] 10/04/2022 11:47 AM

## 2022-10-04 NOTE — Group Note (Signed)
Date:  10/04/2022 Time:  6:58 AM  Group Topic/Focus:  Building Self Esteem:   The Focus of this group is helping patients become aware of the effects of self-esteem on their lives, the things they and others do that enhance or undermine their self-esteem, seeing the relationship between their level of self-esteem and the choices they make and learning ways to enhance self-esteem. Goals Group:   The focus of this group is to help patients establish daily goals to achieve during treatment and discuss how the patient can incorporate goal setting into their daily lives to aide in recovery. Healthy Communication:   The focus of this group is to discuss communication, barriers to communication, as well as healthy ways to communicate with others. Making Healthy Choices:   The focus of this group is to help patients identify negative/unhealthy choices they were using prior to admission and identify positive/healthier coping strategies to replace them upon discharge.    Participation Level:  Active  Participation Quality:  Attentive  Affect:  Appropriate  Cognitive:  Alert, Appropriate, and Oriented  Insight: Improving  Engagement in Group:  Engaged  Modes of Intervention:  Discussion  Additional Comments:    Luis Miller 10/04/2022, 6:58 AM

## 2022-10-04 NOTE — Group Note (Signed)
Recreation Therapy Group Note   Group Topic:Emotion Expression  Group Date: 10/04/2022 Start Time: 1400 End Time: 1455 Facilitators: Rosina Lowenstein, LRT, CTRS Location:  Dayroom  Group Description: Positivity Collage. LRT and patients discussed the importance of having a positive mindset and being happy. Patients received magazines, safety scissors, a glue stick and a piece of paper. Pts were encouraged to find images or words in the magazines that showed "happiness" or positivity to them. Pt shared their collage with the group once they were finished. LRT and pts discussed how it can be difficult to always have a positive mindset, especially when they have mental health challenges.    Goal Area(s) Addressed:  Pt will identify things associate with positivity. Pt will reduce negative thinking. Pt will identify a new coping skill of thinking positive thoughts.    Affect/Mood: Appropriate   Participation Level: Active and Engaged   Participation Quality: Independent   Behavior: Appropriate, Calm, and Cooperative   Speech/Thought Process: Coherent   Insight: Good   Judgement: Good   Modes of Intervention: Art and Activity   Patient Response to Interventions:  Attentive, Engaged, Interested , and Receptive   Education Outcome:  Acknowledges education   Clinical Observations/Individualized Feedback: [name] was active in their participation of session activities and group discussion. Pt identified "a cake, a couple riding a bike on the beach, a little kitten and a bird nest with eggs in it" as positive things. Pt interacted well with LRT and peers duration of session. Pt seemed to have a brighter affect than in previous interactions.    Plan: Continue to engage patient in RT group sessions 2-3x/week.   Rosina Lowenstein, LRT, CTRS 10/04/2022 3:49 PM

## 2022-10-04 NOTE — BH IP Treatment Plan (Signed)
Interdisciplinary Treatment and Diagnostic Plan Update  10/04/2022 Time of Session: 9:35 AM  Luis Miller. MRN: [id number]  Principal Diagnosis: Altered mental status  Secondary Diagnoses: Principal Problem:   Altered mental status Active Problems:   Severe bipolar I disorder, current or most recent episode depressed (HCC)   Current Medications:  Current Facility-Administered Medications  Medication Dose Route Frequency Provider Last Rate Last Admin   benazepril (LOTENSIN) tablet 40 mg  40 mg Oral Daily Ophelia Shoulder E, NP   40 mg at 10/04/22 1100   buPROPion (WELLBUTRIN XL) 24 hr tablet 150 mg  150 mg Oral Daily Ophelia Shoulder E, NP   150 mg at 10/04/22 7829   calcitRIOL (ROCALTROL) capsule 0.25 mcg  0.25 mcg Oral Daily Ophelia Shoulder E, NP   0.25 mcg at 10/04/22 1100   cholecalciferol (VITAMIN D3) 25 MCG (1000 UNIT) tablet 1,000 Units  1,000 Units Oral Daily Ophelia Shoulder E, NP   1,000 Units at 10/04/22 0939   doxepin (SINEQUAN) capsule 100 mg  100 mg Oral QHS Ophelia Shoulder E, NP   100 mg at 10/03/22 2141   epoetin alfa (EPOGEN) injection 4,000 Units  4,000 Units Intravenous Q T,Th,Sa-HD Wendee Beavers, NP   4,000 Units at 10/01/22 1528   hydrALAZINE (APRESOLINE) tablet 100 mg  100 mg Oral TID Ophelia Shoulder E, NP   100 mg at 10/04/22 0939   lamoTRIgine (LAMICTAL) tablet 25 mg  25 mg Oral [id number] Ophelia Shoulder E, NP   25 mg at 10/04/22 0825   loperamide (IMODIUM) capsule 2 mg  2 mg Oral Daily PRN Chales Abrahams, NP       loratadine (CLARITIN) tablet 10 mg  10 mg Oral Daily Ophelia Shoulder E, NP   10 mg at 10/04/22 0939   magnesium hydroxide (MILK OF MAGNESIA) suspension 30 mL  30 mL Oral Daily PRN Chales Abrahams, NP       multivitamin with minerals tablet 1 tablet  1 tablet Oral Daily Ophelia Shoulder E, NP   1 tablet at 10/04/22 0939   OLANZapine (ZYPREXA) tablet 10 mg  10 mg Oral QHS Ophelia Shoulder E, NP   10 mg at 10/03/22 2141   rosuvastatin (CRESTOR) tablet 10 mg  10 mg Oral  Daily Ophelia Shoulder E, NP   10 mg at 10/04/22 5621   PTA Medications: Medications Prior to Admission  Medication Sig Dispense Refill Last Dose   allopurinol (ZYLOPRIM) 100 MG tablet Take 100 mg by mouth 2 (two) times daily.      amLODipine (NORVASC) 10 MG tablet TAKE 1 TABLET DAILY (Patient taking differently: Take 10 mg by mouth daily.) 90 tablet 1    benazepril (LOTENSIN) 40 MG tablet Take 40 mg by mouth daily.       buPROPion (WELLBUTRIN XL) 150 MG 24 hr tablet Take 1 tablet (150 mg total) by mouth daily.      calcitRIOL (ROCALTROL) 0.25 MCG capsule Take 0.25 mcg by mouth daily.      doxepin (SINEQUAN) 100 MG capsule Take 1 capsule (100 mg total) by mouth at bedtime.      hydrALAZINE (APRESOLINE) 100 MG tablet Take 100 mg by mouth 3 (three) times daily.      lamoTRIgine (LAMICTAL) 25 MG tablet Take 1 tablet (25 mg total) by mouth 2 (two) times daily at 8 am and 4 pm.      loperamide (IMODIUM) 2 MG capsule Take 2 mg by mouth daily as needed for diarrhea or loose  stools.      loratadine (CLARITIN) 10 MG tablet TAKE 1 TABLET BY MOUTH EVERY DAY 90 tablet 1    Multiple Vitamin (MULTIVITAMIN) tablet Take 1 tablet by mouth daily.      OLANZapine (ZYPREXA) 10 MG tablet Take 1 tablet (10 mg total) by mouth at bedtime.      rosuvastatin (CRESTOR) 10 MG tablet TAKE 1 TABLET BY MOUTH EVERY DAY 90 tablet 0    Vitamin D, Cholecalciferol, 25 MCG (1000 UT) TABS Take 1,000 tablets by mouth daily.        Patient Stressors: Health problems    Patient Strengths: Ability for insight  Average or above average intelligence  Capable of independent living  General fund of knowledge  Supportive family/friends   Treatment Modalities: Medication Management, Group therapy, Case management,  1 to 1 session with clinician, Psychoeducation, Recreational therapy.   Physician Treatment Plan for Primary Diagnosis: Altered mental status Long Term Goal(s):     Short Term Goals:    Medication Management: Evaluate  patient's response, side effects, and tolerance of medication regimen.  Therapeutic Interventions: 1 to 1 sessions, Unit Group sessions and Medication administration.  Evaluation of Outcomes: Met  Physician Treatment Plan for Secondary Diagnosis: Principal Problem:   Altered mental status Active Problems:   Severe bipolar I disorder, current or most recent episode depressed (HCC)  Long Term Goal(s):     Short Term Goals:       Medication Management: Evaluate patient's response, side effects, and tolerance of medication regimen.  Therapeutic Interventions: 1 to 1 sessions, Unit Group sessions and Medication administration.  Evaluation of Outcomes: Met   RN Treatment Plan for Primary Diagnosis: Altered mental status Long Term Goal(s): Knowledge of disease and therapeutic regimen to maintain health will improve  Short Term Goals: Ability to remain free from injury will improve, Ability to verbalize frustration and anger appropriately will improve, Ability to demonstrate self-control, Ability to participate in decision making will improve, Ability to verbalize feelings will improve, Ability to disclose and discuss suicidal ideas, Ability to identify and develop effective coping behaviors will improve, and Compliance with prescribed medications will improve  Medication Management: RN will administer medications as ordered by provider, will assess and evaluate patient's response and provide education to patient for prescribed medication. RN will report any adverse and/or side effects to prescribing provider.  Therapeutic Interventions: 1 on 1 counseling sessions, Psychoeducation, Medication administration, Evaluate responses to treatment, Monitor vital signs and CBGs as ordered, Perform/monitor CIWA, COWS, AIMS and Fall Risk screenings as ordered, Perform wound care treatments as ordered.  Evaluation of Outcomes: Met   LCSW Treatment Plan for Primary Diagnosis: Altered mental status Long  Term Goal(s): Safe transition to appropriate next level of care at discharge, Engage patient in therapeutic group addressing interpersonal concerns.  Short Term Goals: Engage patient in aftercare planning with referrals and resources, Increase social support, Increase ability to appropriately verbalize feelings, Increase emotional regulation, Facilitate acceptance of mental health diagnosis and concerns, Identify triggers associated with mental health/substance abuse issues, and Increase skills for wellness and recovery  Therapeutic Interventions: Assess for all discharge needs, 1 to 1 time with Social worker, Explore available resources and support systems, Assess for adequacy in community support network, Educate family and significant other(s) on suicide prevention, Complete Psychosocial Assessment, Interpersonal group therapy.  Evaluation of Outcomes: Met   Progress in Treatment: Attending groups: Yes. Participating in groups: Yes. Taking medication as prescribed: Yes. Toleration medication: Yes. Family/Significant other contact made: Yes,  individual(s) contacted:  [name] [phone] Patient understands diagnosis: Yes. Discussing patient identified problems/goals with staff: Yes. Medical problems stabilized or resolved: Yes. Denies suicidal/homicidal ideation: Yes. Issues/concerns per patient self-inventory: No. Other: None   New problem(s) identified: No, Describe:  None identified   New Short Term/Long Term Goal(s):  elimination of symptoms of psychosis, medication management for mood stabilization; elimination of SI thoughts; development of comprehensive mental wellness/sobriety plan.   Patient Goals:  "Just get all my stuff together"   Discharge Plan or Barriers: CSW will assist with appropriate discharge planning.   Reason for Continuation of Hospitalization: Anxiety Medication stabilization  Estimated Length of Stay: 1 to 7 days   Last 3 [place] Suicide Severity  Risk Score: Flowsheet Row Admission (Current) from 09/30/2022 in [facility] BE[facility]AVIORAL MEDICINE Most recent reading at 09/30/2022  2:00 PM Admission (Discharged) from 07/30/2022 in [facility] BEHAVIORAL MEDIC[facility] Most recent reading at 08/23/2022 12:20 PM ED from 07/30/2022 in [facility] at [facility] Most recent reading at 07/30/2022  3:10 PM  C-SSRS RISK CATEGORY No Risk No Risk No Risk       Last PHQ 2/9 Scores:    07/26/2022    9:10 AM 02/24/2022    1:14 PM 01/25/2022    1:34 PM  Depression screen PHQ 2/9  Decreased Interest 1 0 0  Down, Depressed, Hopeless 1 0 0  PHQ - 2 Score 2 0 0  Altered sleeping 0 0 0  Tired, decreased energy 0 0 0  Change in appetite 0 0 0  Feeling bad or failure about yourself  1 0 0  Trouble concentrating 0 0 0  Moving slowly or fidgety/restless 0 0 0  Suicidal thoughts 0 0 0  PHQ-9 Score 3 0 0  Difficult doing work/chores  Not difficult at all     Scribe for Treatment Team: Elza Rafter, LCSWA 10/04/2022 11:50 AM

## 2022-10-04 NOTE — Progress Notes (Signed)
Patient is a voluntary admission on 09/30/22 from the medical floor to [facility] psych for Depression with SI without plan  Patient main concern is caring for himself at home. He lives alone but has support from his son. Has dialysis on Tue, Thur, Sat.  Plan is to discharge home tomorrow with his son picking him up - voices concern on keeping his dialysis appt on Thurs. SW made aware. Other is calm, cooperative, ambulates without assistance, interacts well with peers and staff.  Will continue to monitor.

## 2022-10-04 NOTE — Plan of Care (Signed)
  Problem: Education: Goal: Knowledge of General Education information will improve Description: Including pain rating scale, medication(s)/side effects and non-pharmacologic comfort measures Outcome: Progressing   Problem: Health Behavior/Discharge Planning: Goal: Ability to manage health-related needs will improve Outcome: Progressing   Problem: Clinical Measurements: Goal: Ability to maintain clinical measurements within normal limits will improve Outcome: Progressing Goal: Will remain free from infection Outcome: Progressing Goal: Diagnostic test results will improve Outcome: Progressing Goal: Respiratory complications will improve Outcome: Progressing Goal: Cardiovascular complication will be avoided Outcome: Progressing   Problem: Activity: Goal: Risk for activity intolerance will decrease Outcome: Progressing   Problem: Nutrition: Goal: Adequate nutrition will be maintained Outcome: Progressing   Problem: Coping: Goal: Level of anxiety will decrease Outcome: Progressing   Problem: Elimination: Goal: Will not experience complications related to bowel motility Outcome: Progressing Goal: Will not experience complications related to urinary retention Outcome: Progressing   Problem: Pain Managment: Goal: General experience of comfort will improve Outcome: Progressing   Problem: Safety: Goal: Ability to remain free from injury will improve Outcome: Progressing   Problem: Skin Integrity: Goal: Risk for impaired skin integrity will decrease Outcome: Progressing   Problem: Fluid Volume: Goal: Hemodynamic stability will improve Outcome: Progressing   Problem: Clinical Measurements: Goal: Diagnostic test results will improve Outcome: Progressing Goal: Signs and symptoms of infection will decrease Outcome: Progressing   Problem: Respiratory: Goal: Ability to maintain adequate ventilation will improve Outcome: Progressing   Problem: Education: Goal:  Utilization of techniques to improve thought processes will improve Outcome: Progressing Goal: Knowledge of the prescribed therapeutic regimen will improve Outcome: Progressing   Problem: Activity: Goal: Interest or engagement in leisure activities will improve Outcome: Progressing Goal: Imbalance in normal sleep/wake cycle will improve Outcome: Progressing   Problem: Coping: Goal: Coping ability will improve Outcome: Progressing Goal: Will verbalize feelings Outcome: Progressing   Problem: Health Behavior/Discharge Planning: Goal: Ability to make decisions will improve Outcome: Progressing Goal: Compliance with therapeutic regimen will improve Outcome: Progressing   Problem: Role Relationship: Goal: Will demonstrate positive changes in social behaviors and relationships Outcome: Progressing   Problem: Safety: Goal: Ability to disclose and discuss suicidal ideas will improve Outcome: Progressing Goal: Ability to identify and utilize support systems that promote safety will improve Outcome: Progressing   Problem: Self-Concept: Goal: Will verbalize positive feelings about self Outcome: Progressing Goal: Level of anxiety will decrease Outcome: Progressing   

## 2022-10-04 NOTE — Group Note (Signed)
Date:  10/04/2022 Time:  10:39 AM  Group Topic/Focus:  Rediscovering Joy:   The focus of this group is to explore various ways to relieve stress in a positive manner.    Participation Level:  Did Not Attend  Participation Quality:    Affect:    Cognitive:    Insight:   Engagement in Group:    Modes of Intervention:    Additional Comments:  Chose to stay inside, in the dayroom.  Leonie Green 10/04/2022, 10:39 AM

## 2022-10-04 NOTE — Progress Notes (Signed)
   10/04/22 0557  15 Minute Checks  Location Bedroom  Visual Appearance Calm  Behavior Sleeping  Sleep (Behavioral Health Patients Only)  Calculate sleep? (Click Yes once per 24 hr at 0600 safety check) Yes  Documented sleep last 24 hours 7

## 2022-10-05 DIAGNOSIS — F3130 Bipolar disorder, current episode depressed, mild or moderate severity, unspecified: Secondary | ICD-10-CM

## 2022-10-05 LAB — RENAL FUNCTION PANEL
Albumin: 3.6 g/dL (ref 3.5–5.0)
Anion gap: 13 (ref 5–15)
BUN: 74 mg/dL — ABNORMAL HIGH (ref 8–23)
CO2: 24 mmol/L (ref 22–32)
Calcium: 8.8 mg/dL — ABNORMAL LOW (ref 8.9–10.3)
Chloride: 96 mmol/L — ABNORMAL LOW (ref 98–111)
Creatinine, Ser: 5.56 mg/dL — ABNORMAL HIGH (ref 0.61–1.24)
GFR, Estimated: 10 mL/min — ABNORMAL LOW (ref >60)
Glucose, Bld: 187 mg/dL — ABNORMAL HIGH (ref 70–99)
Phosphorus: 4.2 mg/dL (ref 2.5–4.6)
Potassium: 3.6 mmol/L (ref 3.5–5.1)
Sodium: 133 mmol/L — ABNORMAL LOW (ref 135–145)

## 2022-10-05 LAB — CBC
HCT: 28 % — ABNORMAL LOW (ref 39.0–52.0)
Hemoglobin: 9.6 g/dL — ABNORMAL LOW (ref 13.0–17.0)
MCH: 35.4 pg — ABNORMAL HIGH (ref 26.0–34.0)
MCHC: 34.3 g/dL (ref 30.0–36.0)
MCV: 103.3 fL — ABNORMAL HIGH (ref 80.0–100.0)
Platelets: 163 10*3/uL (ref 150–400)
RBC: 2.71 MIL/uL — ABNORMAL LOW (ref 4.22–5.81)
RDW: 13.8 % (ref 11.5–15.5)
WBC: 4.4 10*3/uL (ref 4.0–10.5)
nRBC: 0 % (ref 0.0–0.2)

## 2022-10-05 MED ORDER — OLANZAPINE 10 MG PO TABS: 10.0000 mg | ORAL_TABLET | Freq: Every day | ORAL | 3 refills | Status: DC

## 2022-10-05 MED ORDER — EPOETIN ALFA 4000 UNIT/ML IJ SOLN
INTRAMUSCULAR | Status: AC
  Filled 2022-10-05: qty 1

## 2022-10-05 MED ORDER — BUPROPION HCL ER (XL) 150 MG PO TB24: 150.0000 mg | ORAL_TABLET | Freq: Every day | ORAL | 3 refills | Status: DC

## 2022-10-05 MED ORDER — DOXEPIN HCL 100 MG PO CAPS: 100.0000 mg | ORAL_CAPSULE | Freq: Every day | ORAL | 3 refills | Status: DC

## 2022-10-05 MED ORDER — LAMOTRIGINE 25 MG PO TABS: 25.0000 mg | ORAL_TABLET | ORAL | 3 refills | Status: DC
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
## 2022-10-05 NOTE — Progress Notes (Signed)
   10/05/22 1400  Psych Admission Type (Psych Patients Only)  Admission Status Voluntary  Psychosocial Assessment  Patient Complaints None  Eye Contact Brief  Facial Expression Flat  Affect Flat  Speech Logical/coherent  Interaction Assertive  Motor Activity Slow  Appearance/Hygiene In scrubs  Behavior Characteristics Cooperative  Mood Pleasant  Thought Process  Coherency WDL  Content WDL  Delusions None reported or observed  Perception WDL  Hallucination None reported or observed  Judgment Impaired  Confusion None  Danger to Self  Current suicidal ideation? Denies  Danger to Others  Danger to Others None reported or observed   Patient discharged discharged at this time in care of son. All discharge instructions given to patient with understanding. Patient went to dialysis today. Tolerated all medications and meals.

## 2022-10-05 NOTE — Care Management Important Message (Signed)
Important Message  Patient Details  Name: Luis Miller. MRN: [id number] Date of Birth: 12/16/1945   Medicare Important Message Given:  Yes     Laretta Alstrom 10/05/2022, 12:26 PM

## 2022-10-05 NOTE — [facility] Suicide Risk Assessment (Signed)
[facility] Discharge Suicide Risk Assessment   Principal Problem: Altered mental status Discharge Diagnoses: Principal Problem:   Altered mental status Active Problems:   Severe bipolar I disorder, current or most recent episode depressed (HCC)   Total Time spent with patient: 1 hour  Musculoskeletal: Strength & Muscle Tone: within normal limits Gait & Station: normal Patient leans: N/A  Psychiatric Specialty Exam  Presentation  General Appearance:  Casual  Eye Contact: Fair  Speech: Slow; Slurred  Speech Volume: Decreased  Handedness: Right   Mood and Affect  Mood: Anxious; Depressed  Duration of Depression Symptoms: Less than two weeks  Affect: Congruent; Restricted   Thought Process  Thought Processes: Goal Directed  Descriptions of Associations:Circumstantial  Orientation:Partial  Thought Content:Logical  History of Schizophrenia/Schizoaffective disorder:No  Duration of Psychotic Symptoms:No data recorded Hallucinations:No data recorded Ideas of Reference:None  Suicidal Thoughts:No data recorded Homicidal Thoughts:No data recorded  Sensorium  Memory: Immediate Fair; Remote Fair  Judgment: Fair  Insight: Fair   Art therapist  Concentration: Fair  Attention Span: Fair  Recall: [employer] of Knowledge: Fair  Language: Fair   Psychomotor Activity  Psychomotor Activity:No data recorded  Assets  Assets: Communication Skills; Desire for Improvement   Sleep  Sleep:No data recorded   Blood pressure 126/69, pulse 90, temperature 97.7 F (36.5 C), resp. rate 18, height 5\' 6"  (1.676 m), weight 81 kg, SpO2 99%. Body mass index is 28.82 kg/m.  Mental Status Per Nursing Assessment::   On Admission:  Suicidal ideation indicated by patient  Demographic Factors:  Male and Age 69 or older  Loss Factors: NA  Historical Factors: NA  Risk Reduction Factors:   NA  Continued Clinical Symptoms:  Depression:    Anhedonia Dysthymia  Cognitive Features That Contribute To Risk:  Closed-mindedness    Suicide Risk:  Minimal: No identifiable suicidal ideation.  Patients presenting with no risk factors but with morbid ruminations; may be classified as minimal risk based on the severity of the depressive symptoms    Plan Of Care/Follow-up recommendations: See social work note   [name], DO 10/05/2022, 9:10 AM

## 2022-10-05 NOTE — Discharge Planning (Signed)
No changes to patient's dialysis schedule. Patient will resume the follow outpatient hemodialysis plan: [facility]  [address] [place], [place] [address] [phone]  Scheduled days: Tuesday Thursday and Saturday Treatment time: 10:30am  Clinic is aware of discharge and ready to accept patient back on Thursday 7/18. Son is aware of dialysis schedule.   Dimas Chyle Dialysis Coordinator II  [facility] Cell: [phone] eFax: [phone] [email or web address]

## 2022-10-05 NOTE — Progress Notes (Signed)
  [facility] Adult Case Management Discharge Plan :  Will you be returning to the same living situation after discharge:  Yes,  pt will be returning home  At discharge, do you have transportation home?: Yes,  pt's son will be  picking him up  Do you have the ability to pay for your medications: Yes, MEDICARE / MEDICARE PART A AND B   Release of information consent forms completed and in the chart;  Patient's signature needed at discharge.  Patient to Follow up at:  Follow-up Information     [facility]. Go on 10/18/2022.   Why: Your appointment is scheduled for 10/18/22 at 10:00 AM with Dr.Eappen   [address] [place], [place] [address]                Next level of care provider has access to [facility] Link:yes  Safety Planning and Suicide Prevention discussed: Luis Miller  Luis Miller [phone]     Has patient been referred to the Quitline?: Patient does not use tobacco/nicotine products  Patient has been referred for addiction treatment: No known substance use disorder.  [address] LCSWA 10/05/2022, 11:04 AM

## 2022-10-05 NOTE — Progress Notes (Signed)
[facility]  Dialysis Note   Subjective:   Patient seen and evaluated during dialysis   HEMODIALYSIS FLOWSHEET:  Blood Flow Rate (mL/min): 400 mL/min Arterial Pressure (mmHg): -220 mmHg Venous Pressure (mmHg): 180 mmHg TMP (mmHg): 8 mmHg Ultrafiltration Rate (mL/min): 686 mL/min Dialysate Flow Rate (mL/min): 300 ml/min Bolus Amount (mL): 100 mL  Tolerating treatment well States he feels "okay"  Objective:  Vital signs in last 24 hours:  Temp:  [97.7 F (36.5 C)-99.5 F (37.5 C)] 97.7 F (36.5 C) (07/16 0858) Pulse Rate:  [71-95] 71 (07/16 0930) Resp:  [14-18] 15 (07/16 0930) BP: (106-152)/(56-82) 106/61 (07/16 0930) SpO2:  [98 %-100 %] 98 % (07/16 0930) Weight:  [81 kg] 81 kg (07/16 0858)  Weight change:  Filed Weights   10/02/22 0939 10/02/22 1336 10/05/22 0858  Weight: 80.8 kg 80.3 kg 81 kg    Intake/Output: No intake/output data recorded.   Intake/Output this shift:  No intake/output data recorded.  Physical Exam: General: NAD, seated in chair  Head: Normocephalic, atraumatic. Moist oral mucosal membranes  Eyes: Anicteric  Lungs:  Clear to auscultation  Heart: Regular rate and rhythm  Abdomen:  Soft, nontender  Extremities: No peripheral edema.  Neurologic: Alert, moving all four extremities  Skin: No lesions  Access: Lt AVF    Basic Metabolic Panel: Recent Labs  Lab 09/30/22 0353 10/01/22 1257 10/02/22 0956  NA 137 137 134*  K 3.8 4.0 3.6  CL 99 101 96*  CO2 27 24 25   GLUCOSE 96 135* 160*  BUN 59* 82* 54*  CREATININE 5.06* 6.25* 4.89*  CALCIUM 9.0 9.2 9.2  PHOS  --  4.5 3.6    Liver Function Tests: Recent Labs  Lab 10/01/22 1257 10/02/22 0956  ALBUMIN 3.7 3.7   No results for input(s): "LIPASE", "AMYLASE" in the last 168 hours. No results for input(s): "AMMONIA" in the last 168 hours.  CBC: Recent Labs  Lab 09/30/22 0353 10/01/22 1257 10/02/22 0956  WBC 3.1* 3.9* 4.2  HGB 9.4* 9.7* 10.4*  HCT 26.5* 27.4*  29.2*  MCV 101.1* 101.5* 100.3*  PLT 137* 160 155    Cardiac Enzymes: No results for input(s): "CKTOTAL", "CKMB", "CKMBINDEX", "TROPONINI" in the last 168 hours.  BNP: Invalid input(s): "POCBNP"  CBG: No results for input(s): "GLUCAP" in the last 168 hours.   Microbiology: Results for orders placed or performed in visit on 05/12/22  Microscopic Examination     Status: Abnormal   Collection Time: 05/12/22 10:39 AM   Urine  Result Value Ref Range Status   WBC, UA 6-10 (A) 0 - 5 /hpf Final   RBC, Urine 0-2 0 - 2 /hpf Final   Epithelial Cells (non renal) 0-10 0 - 10 /hpf Final   Bacteria, UA Moderate (A) None seen/Few Final    Coagulation Studies: No results for input(s): "LABPROT", "INR" in the last 72 hours.  Urinalysis: No results for input(s): "COLORURINE", "LABSPEC", "PHURINE", "GLUCOSEU", "HGBUR", "BILIRUBINUR", "KETONESUR", "PROTEINUR", "UROBILINOGEN", "NITRITE", "LEUKOCYTESUR" in the last 72 hours.  Invalid input(s): "APPERANCEUR"    Imaging: No results found.   Medications:      benazepril  40 mg Oral Daily   buPROPion  150 mg Oral Daily   calcitRIOL  0.25 mcg Oral Daily   cholecalciferol  1,000 Units Oral Daily   doxepin  100 mg Oral QHS   epoetin (EPOGEN/PROCRIT) injection  4,000 Units Intravenous Q T,Th,Sa-HD   hydrALAZINE  100 mg Oral TID   lamoTRIgine  25 mg Oral  [id number]   loratadine  10 mg Oral Daily   multivitamin with minerals  1 tablet Oral Daily   OLANZapine  10 mg Oral QHS   rosuvastatin  10 mg Oral Daily   heparin, heparin, loperamide, magnesium hydroxide  Assessment/ Plan:  [name]. is a 77 y.o.  male white male with end stage renal disease on hemodialysis, hypertension, gout, depression, history of melanoma, crohn's, and CVA who presents to [facility] on 07/30/2022 for Bipolar depression (HCC) [F31.9]   Principal Problem:   Bipolar depression (HCC) End-stage renal disease.  **Due to extended admission, patient will need to  be re-established with outpatient clinic prior to discharge. Please contact renal navigator for discharge updates**   End Stage Renal Disease on hemodialysis: Receiving treatment today, seated in chair. Tolerating treatment well.     [facility] vascular lab)  Date Value Ref Range Status  09/28/2021 3.6 3.5 - 5.1 mmol/L Final    Comment:    Performed at [facility], [address] [place], [place] [address]   No intake or output data in the 24 hours ending 10/05/22 0954   2. Hypotension with history of Hypertension with chronic kidney disease:  - discontinue amlodipine, continue benazepril.  - Blood pressure 119/56 during dialysis  BP 106/61 (BP Location: Right Arm)   Pulse 71   Temp 97.7 F (36.5 C) (Oral)   Resp 15   Ht 5\' 6"  (1.676 m)   Wt 81 kg   SpO2 98%   BMI 28.82 kg/m    3. Anemia of chronic kidney disease/ kidney injury/chronic disease/acute blood loss:   Lab Results  Component Value Date   HGB 10.4 (L) 10/02/2022  Hgb stable Continue Epogen 4000 units IV with dialysis.   4. Secondary Hyperparathyroidism:     Lab Results  Component Value Date   CALCIUM 9.2 10/02/2022   CAION 1.17 11/05/2021   PHOS 3.6 10/02/2022  Will continue to monitor bone minerals during this admission. - Continue daily calcitriol - Continue cholecalciferol - Not currently on a phos binder.    LOS: 5 [facility] 7/16/20249:54 AM

## 2022-10-05 NOTE — Progress Notes (Signed)
  Received patient in bed to unit.   Informed consent signed and in chart.    TX duration: 3.5hrs     Transported back to floor Hand-off given to patient's nurse. No c/o no distress noted  Pt voicing concerns of not ready to go home, not being able to get his meds together when he is by himself.     Access used: LAVG Access issues: none   Total UF removed: 1.0L Medication(s) given: 4,000u epogen Post HD VS: 143/78 Post HD weight: 80.2     Lynann Beaver  Kid[facility] Dialysis Unit

## 2022-10-05 NOTE — Progress Notes (Signed)
   10/05/22 0603  15 Minute Checks  Location Bedroom  Visual Appearance Calm  Behavior Sleeping  Sleep (Behavioral Health Patients Only)  Calculate sleep? (Click Yes once per 24 hr at 0600 safety check) Yes  Documented sleep last 24 hours 7

## 2022-10-06 ENCOUNTER — Telehealth: Payer: Self-pay | Admitting: *Deleted

## 2022-10-06 ENCOUNTER — Other Ambulatory Visit: Payer: Self-pay | Admitting: Psychiatry

## 2022-10-06 DIAGNOSIS — F341 Dysthymic disorder: Secondary | ICD-10-CM | POA: Diagnosis not present

## 2022-10-06 DIAGNOSIS — Z8616 Personal history of COVID-19: Secondary | ICD-10-CM | POA: Diagnosis not present

## 2022-10-06 DIAGNOSIS — Z87891 Personal history of nicotine dependence: Secondary | ICD-10-CM | POA: Diagnosis not present

## 2022-10-06 DIAGNOSIS — M1990 Unspecified osteoarthritis, unspecified site: Secondary | ICD-10-CM | POA: Diagnosis not present

## 2022-10-06 DIAGNOSIS — F6089 Other specific personality disorders: Secondary | ICD-10-CM | POA: Diagnosis not present

## 2022-10-06 DIAGNOSIS — Z85828 Personal history of other malignant neoplasm of skin: Secondary | ICD-10-CM | POA: Diagnosis not present

## 2022-10-06 DIAGNOSIS — M109 Gout, unspecified: Secondary | ICD-10-CM | POA: Diagnosis not present

## 2022-10-06 DIAGNOSIS — E232 Diabetes insipidus: Secondary | ICD-10-CM | POA: Diagnosis not present

## 2022-10-06 DIAGNOSIS — F314 Bipolar disorder, current episode depressed, severe, without psychotic features: Secondary | ICD-10-CM | POA: Diagnosis not present

## 2022-10-06 DIAGNOSIS — N2581 Secondary hyperparathyroidism of renal origin: Secondary | ICD-10-CM | POA: Diagnosis not present

## 2022-10-06 DIAGNOSIS — D631 Anemia in chronic kidney disease: Secondary | ICD-10-CM | POA: Diagnosis not present

## 2022-10-06 DIAGNOSIS — Z556 Problems related to health literacy: Secondary | ICD-10-CM | POA: Diagnosis not present

## 2022-10-06 DIAGNOSIS — E291 Testicular hypofunction: Secondary | ICD-10-CM | POA: Diagnosis not present

## 2022-10-06 DIAGNOSIS — T82898D Other specified complication of vascular prosthetic devices, implants and grafts, subsequent encounter: Secondary | ICD-10-CM | POA: Diagnosis not present

## 2022-10-06 DIAGNOSIS — I120 Hypertensive chronic kidney disease with stage 5 chronic kidney disease or end stage renal disease: Secondary | ICD-10-CM | POA: Diagnosis not present

## 2022-10-06 DIAGNOSIS — N186 End stage renal disease: Secondary | ICD-10-CM | POA: Diagnosis not present

## 2022-10-06 DIAGNOSIS — Z604 Social exclusion and rejection: Secondary | ICD-10-CM | POA: Diagnosis not present

## 2022-10-06 DIAGNOSIS — F3131 Bipolar disorder, current episode depressed, mild: Secondary | ICD-10-CM

## 2022-10-06 DIAGNOSIS — N529 Male erectile dysfunction, unspecified: Secondary | ICD-10-CM | POA: Diagnosis not present

## 2022-10-06 DIAGNOSIS — Z8673 Personal history of transient ischemic attack (TIA), and cerebral infarction without residual deficits: Secondary | ICD-10-CM | POA: Diagnosis not present

## 2022-10-06 DIAGNOSIS — I700 Atherosclerosis of aorta: Secondary | ICD-10-CM | POA: Diagnosis not present

## 2022-10-06 DIAGNOSIS — K5090 Crohn's disease, unspecified, without complications: Secondary | ICD-10-CM | POA: Diagnosis not present

## 2022-10-06 NOTE — Transitions of Care (Post Inpatient/ED Visit) (Signed)
10/06/2022  Name: Luis Miller. MRN: [id number] DOB: 08/24/1945  Today's TOC FU Call Status: Today's TOC FU Call Status:: Successful TOC FU Call Competed TOC FU Call Complete Date: 10/06/22  Transition Care Management Follow-up Telephone Call Date of Discharge: 10/05/22 Discharge Facility: [facility] [place] [facility]) Type of Discharge: Inpatient Admission Primary Inpatient Discharge Diagnosis:: altered mental status How have you been since you were released from the hospital?: Same (I am feeling down. Per patient he is not homicidal or suicidal) Any questions or concerns?: Yes Patient Questions/Concerns:: Patient is depressed. Patient Questions/Concerns Addressed: Other:  Items Reviewed: Did you receive and understand the discharge instructions provided?: Yes Medications obtained,verified, and reconciled?: Yes (Medications Reviewed) Any new allergies since your discharge?: No Dietary orders reviewed?: No Do you have support at home?: Yes People in Home: friend(s) Name of Support/Comfort Primary Source: [name]  Medications Reviewed Today: Medications Reviewed Today     Reviewed by Luella Cook, RN (Case Manager) on 10/06/22 at 1352  Med List Status: <None>   Medication Order Taking? Sig Documenting Provider Last Dose Status Informant  benazepril (LOTENSIN) 40 MG tablet [id number] Yes Take 40 mg by mouth daily.  [provider] Taking Active Self           Med Note Earlie Lou   Tue Aug 05, 2015 11:05 AM)        buPROPion (WELLBUTRIN XL) 150 MG 24 hr tablet [id number] Yes Take 1 tablet (150 mg total) by mouth daily. [name], DO Taking Active   calcitRIOL (ROCALTROL) 0.25 MCG capsule [id number] Yes Take 0.25 mcg by mouth daily. [provider] Taking Active Self  doxepin (SINEQUAN) 100 MG capsule [id number] Yes Take 1 capsule (100 mg total) by mouth at bedtime. [name], DO Taking Active   hydrALAZINE  (APRESOLINE) 100 MG tablet [id number] Yes Take 100 mg by mouth 3 (three) times daily. [provider] Taking Active Self  lamoTRIgine (LAMICTAL) 25 MG tablet [id number] Yes Take 1 tablet (25 mg total) by mouth 2 (two) times daily at 8 am and 4 pm. [name], DO Taking Active   loperamide (IMODIUM) 2 MG capsule [id number] Yes Take 2 mg by mouth daily as needed for diarrhea or loose stools. [provider] Taking Active Self  loratadine (CLARITIN) 10 MG tablet [id number] Yes TAKE 1 TABLET BY MOUTH EVERY DAY Larae Grooms, NP Taking Active Self  Multiple Vitamin (MULTIVITAMIN) tablet [id number] Yes Take 1 tablet by mouth daily. [provider] Taking Active Self  OLANZapine (ZYPREXA) 10 MG tablet [id number] Yes Take 1 tablet (10 mg total) by mouth at bedtime. [name], DO Taking Active   rosuvastatin (CRESTOR) 10 MG tablet [id number] Yes TAKE 1 TABLET BY MOUTH EVERY DAY Larae Grooms, NP Taking Active Self  Vitamin D, Cholecalciferol, 25 MCG (1000 UT) TABS [id number] Yes Take 1,000 tablets by mouth daily.  [provider] Taking Active Self            Home Care and Equipment/Supplies: Were Home Health Services Ordered?: NA Any new equipment or medical supplies ordered?: NA  Functional Questionnaire: Do you need assistance with bathing/showering or dressing?: No Do you need assistance with meal preparation?: Yes Do you need assistance with eating?: No Do you have difficulty maintaining continence: No Do you need assistance with getting out of bed/getting out of a chair/moving?: No Do you have difficulty managing or taking your medications?: No  Follow up appointments reviewed: PCP Follow-up  appointment confirmed?: NA Specialist Hospital Follow-up appointment confirmed?: Yes Date of Specialist follow-up appointment?: 10/18/22 Follow-Up Specialty Provider:: [id number] Dr Elna Breslow, [id number] Michiel Cowboy Do you need  transportation to your follow-up appointment?: No Do you understand care options if your condition(s) worsen?: Yes-patient verbalized understanding  SDOH Interventions Today    Flowsheet Row Most Recent Value  SDOH Interventions   Food Insecurity Interventions Intervention Not Indicated  Housing Interventions Intervention Not Indicated  Transportation Interventions Intervention Not Indicated, Patient Resources (Friends/Family)      Interventions Today    Flowsheet Row Most Recent Value  General Interventions   General Interventions Discussed/Reviewed General Interventions Discussed, General Interventions Reviewed, Doctor Visits, [employer], Communication with  [employer] Social Worker to call patient now since patient is depressed.]  Doctor Visits Discussed/Reviewed Doctor Visits Reviewed, Doctor Visits Discussed  Communication with Social Work  Mental Health Interventions   Mental Health Discussed/Reviewed Mental Health Discussed, Mental Health Reviewed, Depression, Refer to Social Work for resources  Refer to Social Work for resources regarding Other  Pharmacy Interventions   Pharmacy Dicussed/Reviewed Pharmacy Topics Discussed, Pharmacy Topics Reviewed      TOC Interventions Today    Flowsheet Row Most Recent Value  TOC Interventions   TOC Interventions Discussed/Reviewed TOC Interventions Discussed, TOC Interventions Reviewed        Gean Maidens BSN RN [facility] Care Management [phone]

## 2022-10-07 ENCOUNTER — Telehealth: Payer: Self-pay | Admitting: *Deleted

## 2022-10-07 DIAGNOSIS — N250 Renal osteodystrophy: Secondary | ICD-10-CM | POA: Diagnosis not present

## 2022-10-07 DIAGNOSIS — Z992 Dependence on renal dialysis: Secondary | ICD-10-CM | POA: Diagnosis not present

## 2022-10-07 DIAGNOSIS — D631 Anemia in chronic kidney disease: Secondary | ICD-10-CM | POA: Diagnosis not present

## 2022-10-07 DIAGNOSIS — D509 Iron deficiency anemia, unspecified: Secondary | ICD-10-CM | POA: Diagnosis not present

## 2022-10-07 DIAGNOSIS — N186 End stage renal disease: Secondary | ICD-10-CM | POA: Diagnosis not present

## 2022-10-07 NOTE — Patient Instructions (Signed)
Visit Information  Thank you for taking time to visit with me today. Please don't hesitate to contact me if I can be of assistance to you.   Following are the goals we discussed today:  Please follow up with [facility] on 10/18/22 CSW to identify available mental health counselors for ongoing mental health follow up   Our next appointment is by telephone on 10/13/22 at 2pm  Please call the care guide team at [phone] if you need to cancel or reschedule your appointment.   If you are experiencing a Mental Health or Behavioral Health Crisis or need someone to talk to, please call the Suicide and Crisis Lifeline: 988   Patient verbalizes understanding of instructions and care plan provided today and agrees to view in MyChart. Active MyChart status and patient understanding of how to access instructions and care plan via MyChart confirmed with patient.     Telephone follow up appointment with care management team member scheduled for: 10/13/22  Verna Czech, LCSW Clinical Social Worker  [facility] Care Management [phone]

## 2022-10-07 NOTE — Patient Outreach (Signed)
  Care Coordination   Follow Up Visit Note   10/07/2022 Name: Luis Miller. MRN: [id number] DOB: Jan 24, 1946  Luis Miller. is a 77 y.o. year old male who sees Larae Grooms, NP for primary care. I spoke with  Luis Miller. by phone today.  What matters to the patients health and wellness today?  Ongoing mental health support    Goals Addressed             This Visit's Progress    Mental health follow up       Interventions Today    Flowsheet Row Most Recent Value  Chronic Disease   Chronic disease during today's visit Other  [depression]  General Interventions   General Interventions Discussed/Reviewed General Interventions Discussed, Programmer, applications, Communication with  Scientist, physiological in the home,  HH, PT, OT social worker ordered  patient to be referred to meals on wheels, son and ex-wife assist with day to day care]  Doctor Visits Discussed/Reviewed Doctor Visits Reviewed  Communication with RN  Mental Health Interventions   Mental Health Discussed/Reviewed Mental Health Discussed, Depression  [Follow up scheduled with Dr. Elna Breslow on 10/18/22, interested in follow up with a therapist-CSW to identify options]  Pharmacy Interventions   Pharmacy Dicussed/Reviewed Pharmacy Topics Discussed  [Confitmed that son and ex-wife have filled his pill box]  Safety Interventions   Safety Discussed/Reviewed Safety Discussed  [patient encouraged to contact [phone] in the event of a mental health crisis. patient denies thoughts of self harm  or harm to others at this time.]              SDOH assessments and interventions completed:  Yes  SDOH Interventions Today    Flowsheet Row Most Recent Value  SDOH Interventions   Food Insecurity Interventions Intervention Not Indicated  Transportation Interventions Intervention Not Indicated  Utilities Interventions Intervention Not Indicated        Care Coordination Interventions:  Yes, provided   Follow up plan: Follow  up call scheduled for 10/13/22    Encounter Outcome:  Pt. Visit Completed

## 2022-10-09 DIAGNOSIS — N250 Renal osteodystrophy: Secondary | ICD-10-CM | POA: Diagnosis not present

## 2022-10-09 DIAGNOSIS — D631 Anemia in chronic kidney disease: Secondary | ICD-10-CM | POA: Diagnosis not present

## 2022-10-09 DIAGNOSIS — Z992 Dependence on renal dialysis: Secondary | ICD-10-CM | POA: Diagnosis not present

## 2022-10-09 DIAGNOSIS — D509 Iron deficiency anemia, unspecified: Secondary | ICD-10-CM | POA: Diagnosis not present

## 2022-10-09 DIAGNOSIS — N186 End stage renal disease: Secondary | ICD-10-CM | POA: Diagnosis not present

## 2022-10-11 ENCOUNTER — Telehealth: Payer: Self-pay | Admitting: Nurse Practitioner

## 2022-10-11 DIAGNOSIS — T82898D Other specified complication of vascular prosthetic devices, implants and grafts, subsequent encounter: Secondary | ICD-10-CM | POA: Diagnosis not present

## 2022-10-11 DIAGNOSIS — N186 End stage renal disease: Secondary | ICD-10-CM | POA: Diagnosis not present

## 2022-10-11 DIAGNOSIS — I700 Atherosclerosis of aorta: Secondary | ICD-10-CM | POA: Diagnosis not present

## 2022-10-11 DIAGNOSIS — I120 Hypertensive chronic kidney disease with stage 5 chronic kidney disease or end stage renal disease: Secondary | ICD-10-CM | POA: Diagnosis not present

## 2022-10-11 DIAGNOSIS — F314 Bipolar disorder, current episode depressed, severe, without psychotic features: Secondary | ICD-10-CM | POA: Diagnosis not present

## 2022-10-11 DIAGNOSIS — D631 Anemia in chronic kidney disease: Secondary | ICD-10-CM | POA: Diagnosis not present

## 2022-10-11 NOTE — Telephone Encounter (Signed)
Judithann Sauger calling from [employer] [facility] calling to request social work visit for next Monday for community resources. Please advise [phone]- [phone]

## 2022-10-12 ENCOUNTER — Ambulatory Visit: Payer: Medicare Other | Admitting: Urology

## 2022-10-12 DIAGNOSIS — D631 Anemia in chronic kidney disease: Secondary | ICD-10-CM | POA: Diagnosis not present

## 2022-10-12 DIAGNOSIS — N186 End stage renal disease: Secondary | ICD-10-CM | POA: Diagnosis not present

## 2022-10-12 DIAGNOSIS — D509 Iron deficiency anemia, unspecified: Secondary | ICD-10-CM | POA: Diagnosis not present

## 2022-10-12 DIAGNOSIS — Z992 Dependence on renal dialysis: Secondary | ICD-10-CM | POA: Diagnosis not present

## 2022-10-12 DIAGNOSIS — N250 Renal osteodystrophy: Secondary | ICD-10-CM | POA: Diagnosis not present
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
## 2022-10-12 NOTE — Telephone Encounter (Signed)
Spoke with Judithann Sauger from [facility] in regards to patient. Karen Kitchens is requesting another MVS visit for the patient. Per Denny Peon, provided verbal OK for patient. Karen Kitchens had me reach out to [employer] at [phone] to provide verbal OK order for patient to start authorization for approval. Tiffany verbalized understanding and has no further questions at this time.

## 2022-10-13 ENCOUNTER — Ambulatory Visit: Payer: Self-pay | Admitting: *Deleted

## 2022-10-13 ENCOUNTER — Encounter: Payer: Self-pay | Admitting: Urology

## 2022-10-13 ENCOUNTER — Ambulatory Visit (INDEPENDENT_AMBULATORY_CARE_PROVIDER_SITE_OTHER): Payer: Medicare Other | Admitting: Urology

## 2022-10-13 VITALS — BP 146/76 | HR 97 | Wt 179.0 lb

## 2022-10-13 DIAGNOSIS — R972 Elevated prostate specific antigen [PSA]: Secondary | ICD-10-CM

## 2022-10-13 DIAGNOSIS — N529 Male erectile dysfunction, unspecified: Secondary | ICD-10-CM

## 2022-10-13 DIAGNOSIS — N401 Enlarged prostate with lower urinary tract symptoms: Secondary | ICD-10-CM

## 2022-10-13 DIAGNOSIS — R310 Gross hematuria: Secondary | ICD-10-CM

## 2022-10-13 DIAGNOSIS — R319 Hematuria, unspecified: Secondary | ICD-10-CM | POA: Diagnosis not present

## 2022-10-13 DIAGNOSIS — N138 Other obstructive and reflux uropathy: Secondary | ICD-10-CM | POA: Diagnosis not present

## 2022-10-13 LAB — URINALYSIS, COMPLETE
Bilirubin, UA: NEGATIVE
Glucose, UA: NEGATIVE
Ketones, UA: NEGATIVE
Leukocytes,UA: NEGATIVE
Nitrite, UA: NEGATIVE
RBC, UA: NEGATIVE
Specific Gravity, UA: 1.015 (ref 1.005–1.030)
Urobilinogen, Ur: 0.2 mg/dL (ref 0.2–1.0)
pH, UA: 8.5 — ABNORMAL HIGH (ref 5.0–7.5)

## 2022-10-13 LAB — MICROSCOPIC EXAMINATION: Bacteria, UA: NONE SEEN

## 2022-10-13 NOTE — Patient Outreach (Signed)
  Care Coordination   Follow Up Visit Note   10/13/2022 Name: Luis Miller. MRN: [id number] DOB: [birth year]  Luis Balsam. is a 77 y.o. year old male who sees Larae Grooms, NP for primary care. I spoke with  Luis Balsam. by phone today.  What matters to the patients health and wellness today?  Transportation to Dialysis, mental health follow up    Goals Addressed             This Visit's Progress    Mental health follow up       Interventions Today    Flowsheet Row Most Recent Value  Chronic Disease   Chronic disease during today's visit Other  [Major Depression]  General Interventions   General Interventions Discussed/Reviewed General Interventions Reviewed, [employer] requesting assistance with [employer] transit application. Transport needed to Dialysis 3x per week-patient's portion to be faxed to this social worker [phone], forms to be forwarded to provider for completion]  Doctor Visits Discussed/Reviewed Doctor Visits Discussed  Mental Health Interventions   Mental Health Discussed/Reviewed Mental Health Reviewed  [patient has appointment with psychiatrist on 10/18/22-will ask for availability of a therapist for follow up-CSW to assist with referrals if one not available]              SDOH assessments and interventions completed:  No     Care Coordination Interventions:  Yes, provided   Follow up plan: Follow up call scheduled for 10/14/22    Encounter Outcome:  Pt. Visit Completed

## 2022-10-13 NOTE — Patient Instructions (Addendum)
Visit Information  Thank you for taking time to visit with me today. Please don't hesitate to contact me if I can be of assistance to you.   Following are the goals we discussed today:  Please fax [employer]  form to [phone] Please follow up with appointment scheduled with Psychiatrist on 10/18/22   Our next appointment is by telephone on 10/14/22 at 9:30am  Please call the care guide team at [phone] if you need to cancel or reschedule your appointment.   If you are experiencing a Mental Health or Behavioral Health Crisis or need someone to talk to, please call the Suicide and Crisis Lifeline: 988   Patient verbalizes understanding of instructions and care plan provided today and agrees to view in MyChart. Active MyChart status and patient understanding of how to access instructions and care plan via MyChart confirmed with patient.     Telephone follow up appointment with care management team member scheduled for: 10/14/22  Verna Czech, LCSW Clinical Social Worker  [facility] Care Management [phone]

## 2022-10-14 ENCOUNTER — Encounter: Payer: Self-pay | Admitting: *Deleted

## 2022-10-14 ENCOUNTER — Telehealth: Payer: Self-pay | Admitting: Nurse Practitioner

## 2022-10-14 DIAGNOSIS — D509 Iron deficiency anemia, unspecified: Secondary | ICD-10-CM | POA: Diagnosis not present

## 2022-10-14 DIAGNOSIS — Z992 Dependence on renal dialysis: Secondary | ICD-10-CM | POA: Diagnosis not present

## 2022-10-14 DIAGNOSIS — N186 End stage renal disease: Secondary | ICD-10-CM | POA: Diagnosis not present

## 2022-10-14 DIAGNOSIS — N250 Renal osteodystrophy: Secondary | ICD-10-CM | POA: Diagnosis not present

## 2022-10-14 DIAGNOSIS — D631 Anemia in chronic kidney disease: Secondary | ICD-10-CM | POA: Diagnosis not present

## 2022-10-14 LAB — PSA, TOTAL AND FREE
PSA, Free Pct: 18.3 %
PSA, Free: 1.48 ng/mL
Prostate Specific Ag, Serum: 8.1 ng/mL — ABNORMAL HIGH (ref 0.0–4.0)

## 2022-10-14 NOTE — Telephone Encounter (Signed)
Home Health Verbal Orders - Caller/Agency: Sheran Luz Number: [phone] Requesting OT/PT/Skilled Nursing/Social Work/Speech Therapy: OT  Frequency:   Care giver requested to move the OT eval from this week to next week   Just needs a new order for next week.

## 2022-10-14 NOTE — Telephone Encounter (Signed)
Left message for Thayer Ohm from [facility] to provide OK verbal orders for the patient. Jama Flavors to give our office a call if he has any questions or concerns.

## 2022-10-14 NOTE — Patient Outreach (Signed)
  Care Coordination   Follow Up Visit Note   10/14/2022 Name: Luis Miller. MRN: [id number] DOB: [birth year]  Luis Balsam. is a 77 y.o. year old male who sees Larae Grooms, NP for primary care. I spoke with  Luis Balsam. 's son and family friend by phone today.  What matters to the patients health and wellness today?  Transportation to Dialysis    Goals Addressed             This Visit's Progress    transportation needs       Interventions Today    Flowsheet Row Most Recent Value  Chronic Disease   Chronic disease during today's visit Other  [Major Depression]  General Interventions   General Interventions Discussed/Reviewed Communication with  Communication with --  [spoke with patient's son and family friend regarding transportation needs and required forms. Dialysis center may be able to complete eligibility forms for [employer], will plan to follow up next week]              SDOH assessments and interventions completed:  No     Care Coordination Interventions:  Yes, provided   Follow up plan: Follow up call scheduled for 10/19/22    Encounter Outcome:  Pt. Visit Completed

## 2022-10-14 NOTE — Telephone Encounter (Signed)
Home Health Verbal Orders - Caller/Agency: Thayer Ohm with [facility] Callback Number: [phone]  Requesting PT Frequency: 1 x wk for 8 wks  Please assist further

## 2022-10-14 NOTE — Telephone Encounter (Signed)
Left message for Darral Dash from [facility] to provide OK verbal orders for patient. Doristine Counter to give our office a call back if she has any questions.

## 2022-10-16 DIAGNOSIS — N250 Renal osteodystrophy: Secondary | ICD-10-CM | POA: Diagnosis not present

## 2022-10-16 DIAGNOSIS — N186 End stage renal disease: Secondary | ICD-10-CM | POA: Diagnosis not present

## 2022-10-16 DIAGNOSIS — D631 Anemia in chronic kidney disease: Secondary | ICD-10-CM | POA: Diagnosis not present

## 2022-10-16 DIAGNOSIS — Z992 Dependence on renal dialysis: Secondary | ICD-10-CM | POA: Diagnosis not present

## 2022-10-16 DIAGNOSIS — D509 Iron deficiency anemia, unspecified: Secondary | ICD-10-CM | POA: Diagnosis not present

## 2022-10-18 ENCOUNTER — Encounter: Payer: Self-pay | Admitting: Psychiatry

## 2022-10-18 ENCOUNTER — Ambulatory Visit
Admission: RE | Admit: 2022-10-18 | Discharge: 2022-10-18 | Disposition: A | Discharge status: Home / Self Care | Payer: Medicare Other | Source: Ambulatory Visit | Attending: Psychiatry | Admitting: Psychiatry

## 2022-10-18 ENCOUNTER — Ambulatory Visit (INDEPENDENT_AMBULATORY_CARE_PROVIDER_SITE_OTHER): Payer: Medicare Other | Admitting: Psychiatry

## 2022-10-18 VITALS — BP 152/78 | HR 88 | Temp 98.1°F | Ht 66.0 in | Wt 182.6 lb

## 2022-10-18 DIAGNOSIS — F314 Bipolar disorder, current episode depressed, severe, without psychotic features: Secondary | ICD-10-CM

## 2022-10-18 DIAGNOSIS — G4701 Insomnia due to medical condition: Secondary | ICD-10-CM | POA: Diagnosis not present

## 2022-10-18 DIAGNOSIS — Z79899 Other long term (current) drug therapy: Secondary | ICD-10-CM | POA: Diagnosis not present

## 2022-10-18 DIAGNOSIS — Z9189 Other specified personal risk factors, not elsewhere classified: Secondary | ICD-10-CM | POA: Diagnosis not present

## 2022-10-18 DIAGNOSIS — Z136 Encounter for screening for cardiovascular disorders: Secondary | ICD-10-CM | POA: Diagnosis not present

## 2022-10-18 MED ORDER — LITHIUM CARBONATE 150 MG PO CAPS
150.0000 mg | ORAL_CAPSULE | Freq: Every day | ORAL | 0 refills | Status: DC
Start: 2022-10-18 — End: 2022-11-26

## 2022-10-18 NOTE — Patient Instructions (Addendum)
Please call for EKG - [phone]   [email or web address]  [email or web address]  [employer] [email or web address] [address] [place], [place] [address]  ~20.2 mi [phone]  [facility] [email or web address] [address] [place], [place] [address]  ~20.9 mi [phone]   [facility] - [phone]  [facility] - [phone]  [facility] - [phone]   [facility] [phone]  [facility] [phone]   [email or web address] this clinician can offer telehealth and has a sliding scale option  [email or web address] this group also offers sliding scale rates and is based out of [place]   [employer] [facility] has interns who offer sliding scale rates and some of the full time clinicians do, as well. You complete their contact form on their website and the referrals coordinator will help to get connected to someone   Medicaid below :  [facility] [address] [place], [place] [address]  [phone]    [employer] [address] [place], [place] [address]  [phone]    [facility] [address] [place], [place] [address]  [phone]
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
## 2022-10-19 ENCOUNTER — Encounter: Payer: Self-pay | Admitting: *Deleted

## 2022-10-19 DIAGNOSIS — N186 End stage renal disease: Secondary | ICD-10-CM | POA: Diagnosis not present

## 2022-10-19 DIAGNOSIS — F314 Bipolar disorder, current episode depressed, severe, without psychotic features: Secondary | ICD-10-CM | POA: Diagnosis not present

## 2022-10-19 DIAGNOSIS — T82898D Other specified complication of vascular prosthetic devices, implants and grafts, subsequent encounter: Secondary | ICD-10-CM | POA: Diagnosis not present

## 2022-10-19 DIAGNOSIS — Z992 Dependence on renal dialysis: Secondary | ICD-10-CM | POA: Diagnosis not present

## 2022-10-19 DIAGNOSIS — D509 Iron deficiency anemia, unspecified: Secondary | ICD-10-CM | POA: Diagnosis not present

## 2022-10-19 DIAGNOSIS — I120 Hypertensive chronic kidney disease with stage 5 chronic kidney disease or end stage renal disease: Secondary | ICD-10-CM | POA: Diagnosis not present

## 2022-10-19 DIAGNOSIS — D631 Anemia in chronic kidney disease: Secondary | ICD-10-CM | POA: Diagnosis not present

## 2022-10-19 DIAGNOSIS — N250 Renal osteodystrophy: Secondary | ICD-10-CM | POA: Diagnosis not present

## 2022-10-19 DIAGNOSIS — I700 Atherosclerosis of aorta: Secondary | ICD-10-CM | POA: Diagnosis not present

## 2022-10-20 ENCOUNTER — Telehealth: Payer: Self-pay | Admitting: Nurse Practitioner

## 2022-10-20 DIAGNOSIS — I120 Hypertensive chronic kidney disease with stage 5 chronic kidney disease or end stage renal disease: Secondary | ICD-10-CM | POA: Diagnosis not present

## 2022-10-20 DIAGNOSIS — D631 Anemia in chronic kidney disease: Secondary | ICD-10-CM | POA: Diagnosis not present

## 2022-10-20 DIAGNOSIS — F314 Bipolar disorder, current episode depressed, severe, without psychotic features: Secondary | ICD-10-CM | POA: Diagnosis not present

## 2022-10-20 DIAGNOSIS — I700 Atherosclerosis of aorta: Secondary | ICD-10-CM | POA: Diagnosis not present

## 2022-10-20 DIAGNOSIS — T82898D Other specified complication of vascular prosthetic devices, implants and grafts, subsequent encounter: Secondary | ICD-10-CM | POA: Diagnosis not present

## 2022-10-20 DIAGNOSIS — N186 End stage renal disease: Secondary | ICD-10-CM | POA: Diagnosis not present

## 2022-10-20 DIAGNOSIS — Z992 Dependence on renal dialysis: Secondary | ICD-10-CM | POA: Diagnosis not present

## 2022-10-20 NOTE — Telephone Encounter (Signed)
Home Health Verbal Orders - Caller/Agency: Sheran Luz Number:  [phone] Requesting OT/PT/Skilled Nursing/Social Work/Speech Therapy: OT  Frequency:   Eval and patient does not need OT services, but she would like for him to have a Speech Evaluation for cognition.

## 2022-10-21 DIAGNOSIS — Z992 Dependence on renal dialysis: Secondary | ICD-10-CM | POA: Diagnosis not present

## 2022-10-21 DIAGNOSIS — D631 Anemia in chronic kidney disease: Secondary | ICD-10-CM | POA: Diagnosis not present

## 2022-10-21 DIAGNOSIS — D509 Iron deficiency anemia, unspecified: Secondary | ICD-10-CM | POA: Diagnosis not present

## 2022-10-21 DIAGNOSIS — N250 Renal osteodystrophy: Secondary | ICD-10-CM | POA: Diagnosis not present

## 2022-10-21 DIAGNOSIS — N186 End stage renal disease: Secondary | ICD-10-CM | POA: Diagnosis not present

## 2022-10-21 NOTE — Telephone Encounter (Signed)
Spoke with Darral Dash and provided verbal OK for patient orders. Esther verbalized understanding.

## 2022-10-22 ENCOUNTER — Ambulatory Visit
Admission: RE | Admit: 2022-10-22 | Discharge: 2022-10-22 | Disposition: A | Discharge status: Home / Self Care | Payer: Medicare Other | Source: Ambulatory Visit | Attending: Urology | Admitting: Urology

## 2022-10-22 DIAGNOSIS — K8020 Calculus of gallbladder without cholecystitis without obstruction: Secondary | ICD-10-CM | POA: Diagnosis not present

## 2022-10-22 DIAGNOSIS — R310 Gross hematuria: Secondary | ICD-10-CM | POA: Insufficient documentation

## 2022-10-22 DIAGNOSIS — K5730 Diverticulosis of large intestine without perforation or abscess without bleeding: Secondary | ICD-10-CM | POA: Diagnosis not present

## 2022-10-22 DIAGNOSIS — N281 Cyst of kidney, acquired: Secondary | ICD-10-CM | POA: Diagnosis not present

## 2022-10-23 DIAGNOSIS — Z992 Dependence on renal dialysis: Secondary | ICD-10-CM | POA: Diagnosis not present

## 2022-10-23 DIAGNOSIS — D631 Anemia in chronic kidney disease: Secondary | ICD-10-CM | POA: Diagnosis not present

## 2022-10-23 DIAGNOSIS — D509 Iron deficiency anemia, unspecified: Secondary | ICD-10-CM | POA: Diagnosis not present

## 2022-10-23 DIAGNOSIS — N250 Renal osteodystrophy: Secondary | ICD-10-CM | POA: Diagnosis not present

## 2022-10-23 DIAGNOSIS — N186 End stage renal disease: Secondary | ICD-10-CM | POA: Diagnosis not present

## 2022-10-25 DIAGNOSIS — L570 Actinic keratosis: Secondary | ICD-10-CM | POA: Diagnosis not present

## 2022-10-25 DIAGNOSIS — D225 Melanocytic nevi of trunk: Secondary | ICD-10-CM | POA: Diagnosis not present

## 2022-10-25 DIAGNOSIS — Z872 Personal history of diseases of the skin and subcutaneous tissue: Secondary | ICD-10-CM | POA: Diagnosis not present

## 2022-10-25 DIAGNOSIS — C44629 Squamous cell carcinoma of skin of left upper limb, including shoulder: Secondary | ICD-10-CM | POA: Diagnosis not present

## 2022-10-25 DIAGNOSIS — Z86006 Personal history of melanoma in-situ: Secondary | ICD-10-CM | POA: Diagnosis not present

## 2022-10-25 DIAGNOSIS — Z85828 Personal history of other malignant neoplasm of skin: Secondary | ICD-10-CM | POA: Diagnosis not present

## 2022-10-25 DIAGNOSIS — Z85820 Personal history of malignant melanoma of skin: Secondary | ICD-10-CM | POA: Diagnosis not present

## 2022-10-25 DIAGNOSIS — D485 Neoplasm of uncertain behavior of skin: Secondary | ICD-10-CM | POA: Diagnosis not present

## 2022-10-25 DIAGNOSIS — L821 Other seborrheic keratosis: Secondary | ICD-10-CM | POA: Diagnosis not present

## 2022-10-25 DIAGNOSIS — C44622 Squamous cell carcinoma of skin of right upper limb, including shoulder: Secondary | ICD-10-CM | POA: Diagnosis not present

## 2022-10-25 DIAGNOSIS — Z08 Encounter for follow-up examination after completed treatment for malignant neoplasm: Secondary | ICD-10-CM | POA: Diagnosis not present

## 2022-10-26 DIAGNOSIS — N250 Renal osteodystrophy: Secondary | ICD-10-CM | POA: Diagnosis not present

## 2022-10-26 DIAGNOSIS — N186 End stage renal disease: Secondary | ICD-10-CM | POA: Diagnosis not present

## 2022-10-26 DIAGNOSIS — D631 Anemia in chronic kidney disease: Secondary | ICD-10-CM | POA: Diagnosis not present

## 2022-10-26 DIAGNOSIS — Z992 Dependence on renal dialysis: Secondary | ICD-10-CM | POA: Diagnosis not present

## 2022-10-26 DIAGNOSIS — D509 Iron deficiency anemia, unspecified: Secondary | ICD-10-CM | POA: Diagnosis not present

## 2022-10-28 DIAGNOSIS — D631 Anemia in chronic kidney disease: Secondary | ICD-10-CM | POA: Diagnosis not present

## 2022-10-28 DIAGNOSIS — Z992 Dependence on renal dialysis: Secondary | ICD-10-CM | POA: Diagnosis not present

## 2022-10-28 DIAGNOSIS — N250 Renal osteodystrophy: Secondary | ICD-10-CM | POA: Diagnosis not present

## 2022-10-28 DIAGNOSIS — D509 Iron deficiency anemia, unspecified: Secondary | ICD-10-CM | POA: Diagnosis not present

## 2022-10-28 DIAGNOSIS — N186 End stage renal disease: Secondary | ICD-10-CM | POA: Diagnosis not present

## 2022-10-29 ENCOUNTER — Telehealth: Payer: Self-pay | Admitting: Nurse Practitioner

## 2022-10-29 ENCOUNTER — Ambulatory Visit: Payer: Self-pay

## 2022-10-29 ENCOUNTER — Telehealth: Payer: Self-pay | Admitting: Psychiatry

## 2022-10-29 DIAGNOSIS — I700 Atherosclerosis of aorta: Secondary | ICD-10-CM | POA: Diagnosis not present

## 2022-10-29 DIAGNOSIS — N186 End stage renal disease: Secondary | ICD-10-CM | POA: Diagnosis not present

## 2022-10-29 DIAGNOSIS — T82898D Other specified complication of vascular prosthetic devices, implants and grafts, subsequent encounter: Secondary | ICD-10-CM | POA: Diagnosis not present

## 2022-10-29 DIAGNOSIS — D631 Anemia in chronic kidney disease: Secondary | ICD-10-CM | POA: Diagnosis not present

## 2022-10-29 DIAGNOSIS — I120 Hypertensive chronic kidney disease with stage 5 chronic kidney disease or end stage renal disease: Secondary | ICD-10-CM | POA: Diagnosis not present

## 2022-10-29 DIAGNOSIS — F314 Bipolar disorder, current episode depressed, severe, without psychotic features: Secondary | ICD-10-CM | POA: Diagnosis not present

## 2022-10-29 NOTE — Telephone Encounter (Signed)
I have referred patient to Filomena Jungling for consultation for ketamine nasal spray.

## 2022-10-29 NOTE — Telephone Encounter (Signed)
Home Health Verbal Orders - Caller/Agency: Lynden Ang from [facility]  Callback Number: [phone] Requesting speech therapy Frequency: speech /cognition/1x4/

## 2022-10-29 NOTE — Telephone Encounter (Signed)
  Chief Complaint: Memory problems Symptoms:  Frequency:  Pertinent Negatives: Patient denies  Disposition: [] ED /[] Urgent Care (no appt availability in office) / [] Appointment(In office/virtual)/ []  [facility]/ [] Home Care/ [] Refused Recommended Disposition /[] [facility]/ [x]  Follow-up with PCP Additional Notes: Called Cathy from [facility] back. She would like pt to have a dementia work -up. She reports that pt is forgetting a lot of things, he is forgetting to take his medications. Family members have also noticed this. Pt does have a hx of depression and other behavioral health issues.  Lynden Ang gave pt a SLUMS Exam. Pt scored 10/30 which is in the dementia category. Lynden Ang would like pt to have labs and imaging. Please advise.    Summary: forgetting a lot   Cathy from [facility] called in about pt showing signs of dementia. He is forgetting a lot. She states thinks he will benefit from a dementia work up.     Reason for Disposition  [1] Follow-up call from patient regarding patient's clinical status AND [2] information urgent  Answer Assessment - Initial Assessment Questions 1. REASON FOR CALL or QUESTION: "What is your reason for calling today?" or "How can I best help you?" or "What question do you have that I can help answer?"     Pt needs a dementia work-up 2. CALLER: Document the source of call. (e.g., laboratory, patient).     Cathy from [employer]  Protocols used: PCP Call - No Triage-A-AH

## 2022-10-30 DIAGNOSIS — Z992 Dependence on renal dialysis: Secondary | ICD-10-CM | POA: Diagnosis not present

## 2022-10-30 DIAGNOSIS — D509 Iron deficiency anemia, unspecified: Secondary | ICD-10-CM | POA: Diagnosis not present

## 2022-10-30 DIAGNOSIS — N250 Renal osteodystrophy: Secondary | ICD-10-CM | POA: Diagnosis not present

## 2022-10-30 DIAGNOSIS — N186 End stage renal disease: Secondary | ICD-10-CM | POA: Diagnosis not present

## 2022-10-30 DIAGNOSIS — D631 Anemia in chronic kidney disease: Secondary | ICD-10-CM | POA: Diagnosis not present
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
## 2022-11-01 ENCOUNTER — Encounter (INDEPENDENT_AMBULATORY_CARE_PROVIDER_SITE_OTHER): Payer: Self-pay | Admitting: Vascular Surgery

## 2022-11-01 ENCOUNTER — Ambulatory Visit (INDEPENDENT_AMBULATORY_CARE_PROVIDER_SITE_OTHER): Payer: Medicare Other | Admitting: Vascular Surgery

## 2022-11-01 ENCOUNTER — Ambulatory Visit (INDEPENDENT_AMBULATORY_CARE_PROVIDER_SITE_OTHER): Payer: Medicare Other

## 2022-11-01 VITALS — BP 127/70 | HR 76 | Resp 18 | Ht 66.0 in | Wt 182.0 lb

## 2022-11-01 DIAGNOSIS — I10 Essential (primary) hypertension: Secondary | ICD-10-CM | POA: Diagnosis not present

## 2022-11-01 DIAGNOSIS — E782 Mixed hyperlipidemia: Secondary | ICD-10-CM | POA: Diagnosis not present

## 2022-11-01 DIAGNOSIS — E1129 Type 2 diabetes mellitus with other diabetic kidney complication: Secondary | ICD-10-CM | POA: Diagnosis not present

## 2022-11-01 DIAGNOSIS — Z992 Dependence on renal dialysis: Secondary | ICD-10-CM

## 2022-11-01 DIAGNOSIS — N186 End stage renal disease: Secondary | ICD-10-CM | POA: Diagnosis not present

## 2022-11-01 NOTE — Telephone Encounter (Signed)
Left message for Lynden Ang from [facility] to provide verbal OK orders per Jacquelin Hawking, PA. Starlyn Skeans to give our office a call back if she has any questions or concerns regarding voice message left.

## 2022-11-02 DIAGNOSIS — D509 Iron deficiency anemia, unspecified: Secondary | ICD-10-CM | POA: Diagnosis not present

## 2022-11-02 DIAGNOSIS — D631 Anemia in chronic kidney disease: Secondary | ICD-10-CM | POA: Diagnosis not present

## 2022-11-02 DIAGNOSIS — Z992 Dependence on renal dialysis: Secondary | ICD-10-CM | POA: Diagnosis not present

## 2022-11-02 DIAGNOSIS — N186 End stage renal disease: Secondary | ICD-10-CM | POA: Diagnosis not present

## 2022-11-02 DIAGNOSIS — N250 Renal osteodystrophy: Secondary | ICD-10-CM | POA: Diagnosis not present

## 2022-11-02 NOTE — Telephone Encounter (Signed)
Called and scheduled appointment on 11/05/2022 @ 2:00 pm.

## 2022-11-03 ENCOUNTER — Telehealth: Payer: Self-pay

## 2022-11-03 DIAGNOSIS — T82898D Other specified complication of vascular prosthetic devices, implants and grafts, subsequent encounter: Secondary | ICD-10-CM | POA: Diagnosis not present

## 2022-11-03 DIAGNOSIS — D631 Anemia in chronic kidney disease: Secondary | ICD-10-CM | POA: Diagnosis not present

## 2022-11-03 DIAGNOSIS — I120 Hypertensive chronic kidney disease with stage 5 chronic kidney disease or end stage renal disease: Secondary | ICD-10-CM | POA: Diagnosis not present

## 2022-11-03 DIAGNOSIS — N186 End stage renal disease: Secondary | ICD-10-CM | POA: Diagnosis not present

## 2022-11-03 DIAGNOSIS — F314 Bipolar disorder, current episode depressed, severe, without psychotic features: Secondary | ICD-10-CM | POA: Diagnosis not present

## 2022-11-03 DIAGNOSIS — I700 Atherosclerosis of aorta: Secondary | ICD-10-CM | POA: Diagnosis not present

## 2022-11-03 NOTE — Telephone Encounter (Signed)
Contacted Ms. Luis Miller, discussed that per discussion with Dr. Cherylann Ratel, nephrologist the plan was to have his lithium levels drawn twice a week prior to dialysis.  She agrees to discuss with nephrology.  She agrees to fax the lab report to this provider as well.

## 2022-11-03 NOTE — Telephone Encounter (Signed)
Yes , aware and confirmed , plan was discussed by phone with nephrologist Dr.Lateef prior to starting Lithium. Please let them know.

## 2022-11-03 NOTE — Telephone Encounter (Signed)
Mrs. Luis Miller left a message that patient has dialysis with them and that he states he is on lithium and he needs his labwork done monthly. they need to speak with you about his treatment and have a order for labwork.

## 2022-11-03 NOTE — Telephone Encounter (Signed)
states they need a conformation that you are aware that there is a interaction with other medication the benazepril and the lithium

## 2022-11-04 ENCOUNTER — Other Ambulatory Visit
Admission: RE | Admit: 2022-11-04 | Discharge: 2022-11-04 | Disposition: A | Discharge status: Home / Self Care | Payer: Medicare Other | Source: Other Acute Inpatient Hospital | Attending: Nephrology | Admitting: Nephrology

## 2022-11-04 DIAGNOSIS — Z992 Dependence on renal dialysis: Secondary | ICD-10-CM | POA: Diagnosis not present

## 2022-11-04 DIAGNOSIS — R7889 Finding of other specified substances, not normally found in blood: Secondary | ICD-10-CM | POA: Insufficient documentation

## 2022-11-04 DIAGNOSIS — N186 End stage renal disease: Secondary | ICD-10-CM | POA: Diagnosis not present

## 2022-11-04 DIAGNOSIS — N250 Renal osteodystrophy: Secondary | ICD-10-CM | POA: Diagnosis not present

## 2022-11-04 DIAGNOSIS — D631 Anemia in chronic kidney disease: Secondary | ICD-10-CM | POA: Diagnosis not present

## 2022-11-04 DIAGNOSIS — D509 Iron deficiency anemia, unspecified: Secondary | ICD-10-CM | POA: Diagnosis not present

## 2022-11-04 LAB — LITHIUM LEVEL: Lithium Lvl: 0.33 mmol/L — ABNORMAL LOW (ref 0.60–1.20)

## 2022-11-05 ENCOUNTER — Encounter: Payer: Self-pay | Admitting: Physician Assistant

## 2022-11-05 ENCOUNTER — Ambulatory Visit: Payer: Medicare Other | Admitting: Physician Assistant

## 2022-11-05 VITALS — BP 107/67 | HR 77 | Wt 177.2 lb

## 2022-11-05 DIAGNOSIS — N186 End stage renal disease: Secondary | ICD-10-CM | POA: Diagnosis not present

## 2022-11-05 DIAGNOSIS — K5090 Crohn's disease, unspecified, without complications: Secondary | ICD-10-CM | POA: Diagnosis not present

## 2022-11-05 DIAGNOSIS — Z8616 Personal history of COVID-19: Secondary | ICD-10-CM | POA: Diagnosis not present

## 2022-11-05 DIAGNOSIS — Z8673 Personal history of transient ischemic attack (TIA), and cerebral infarction without residual deficits: Secondary | ICD-10-CM | POA: Diagnosis not present

## 2022-11-05 DIAGNOSIS — F341 Dysthymic disorder: Secondary | ICD-10-CM | POA: Diagnosis not present

## 2022-11-05 DIAGNOSIS — F3130 Bipolar disorder, current episode depressed, mild or moderate severity, unspecified: Secondary | ICD-10-CM | POA: Diagnosis not present

## 2022-11-05 DIAGNOSIS — I120 Hypertensive chronic kidney disease with stage 5 chronic kidney disease or end stage renal disease: Secondary | ICD-10-CM | POA: Diagnosis not present

## 2022-11-05 DIAGNOSIS — N2581 Secondary hyperparathyroidism of renal origin: Secondary | ICD-10-CM | POA: Diagnosis not present

## 2022-11-05 DIAGNOSIS — E291 Testicular hypofunction: Secondary | ICD-10-CM | POA: Diagnosis not present

## 2022-11-05 DIAGNOSIS — M1990 Unspecified osteoarthritis, unspecified site: Secondary | ICD-10-CM | POA: Diagnosis not present

## 2022-11-05 DIAGNOSIS — F6089 Other specific personality disorders: Secondary | ICD-10-CM | POA: Diagnosis not present

## 2022-11-05 DIAGNOSIS — N529 Male erectile dysfunction, unspecified: Secondary | ICD-10-CM | POA: Diagnosis not present

## 2022-11-05 DIAGNOSIS — Z85828 Personal history of other malignant neoplasm of skin: Secondary | ICD-10-CM | POA: Diagnosis not present

## 2022-11-05 DIAGNOSIS — T82898D Other specified complication of vascular prosthetic devices, implants and grafts, subsequent encounter: Secondary | ICD-10-CM | POA: Diagnosis not present

## 2022-11-05 DIAGNOSIS — Z87891 Personal history of nicotine dependence: Secondary | ICD-10-CM | POA: Diagnosis not present

## 2022-11-05 DIAGNOSIS — F314 Bipolar disorder, current episode depressed, severe, without psychotic features: Secondary | ICD-10-CM | POA: Diagnosis not present

## 2022-11-05 DIAGNOSIS — D631 Anemia in chronic kidney disease: Secondary | ICD-10-CM | POA: Diagnosis not present

## 2022-11-05 DIAGNOSIS — I700 Atherosclerosis of aorta: Secondary | ICD-10-CM | POA: Diagnosis not present

## 2022-11-05 DIAGNOSIS — Z604 Social exclusion and rejection: Secondary | ICD-10-CM | POA: Diagnosis not present

## 2022-11-05 DIAGNOSIS — Z556 Problems related to health literacy: Secondary | ICD-10-CM | POA: Diagnosis not present

## 2022-11-05 DIAGNOSIS — M109 Gout, unspecified: Secondary | ICD-10-CM | POA: Diagnosis not present

## 2022-11-05 DIAGNOSIS — E232 Diabetes insipidus: Secondary | ICD-10-CM | POA: Diagnosis not present

## 2022-11-05 NOTE — Progress Notes (Unsigned)
Acute Office Visit   Patient: Luis Miller.   DOB: 07/07/1945   77 y.o. Male  MRN: [id number] Visit Date: 11/05/2022  Today's healthcare provider: Oswaldo Conroy Georjean Toya, PA-C  Introduced myself to the patient as a Secondary school teacher and provided education on APPs in clinical practice.    Chief Complaint  Patient presents with   Dementia   Memory Loss   Depression    Patient says he was recently in the hospital for Depression. Patient says he is really depressed to where it is effect his every day life, to where he sometimes forget things.    Subjective    HPI HPI     Depression    Additional comments: Patient says he was recently in the hospital for Depression. Patient says he is really depressed to where it is effect his every day life, to where he sometimes forget things.       Last edited by Malen Gauze, CMA on 11/05/2022  2:21 PM.       Concern for Memory Loss/ Dementia  He reports he came home from the hospital after being admitted for depression He was hospitalized for mood several times (07/30/22-09/26/22, 09/30/22-10/06/22)  He has had ECT treatments this year  He has had significant help from his son and ex-wife and lives alone He reports he is doing dialysis 3 times per week and has home health as well as PT services coming to his home   His apt was made by [place] from [facility] health services as she is concerned that he is forgetting a lot of things He states his family has noticed that his concentration and memory seems altered lately   He states he has been getting alerts to take his medications (reminders) and this has improved compliance   He reports he feeds himself 2-3 times per day and makes snacks throughout the day. Dialysis days sometimes restrict eating lunch  He denies leaving the stove on, leaving doors unlocked or open He reports that he frequently checks his calendar for apts but he sometimes has trouble reading them due to cramped writing     Patient has previous hx of bipolar 1 disorder, depression and is currently on dialysis  He is currently taking Trazodone 50 mg PO at bedtime PRN, Lamotrigine 25 mg PO BID, Lithium 150 mg PO suppertime, Wellbutrin 150 mg PO every day, Zyrexa 10 mg PO at bedtime, - managed by Psychiatry  He thinks he has been on lithium for about 2 weeks now    He reports concerns for Constipation He mentioned this to his Dialysis and they called him in a script for Lactulose        11/05/2022    2:23 PM 02/23/2017    1:46 PM  MMSE - Mini Mental State Exam  Orientation to time 4 5  Orientation to Place 5 5  Registration 3 3  Attention/ Calculation 3 4  Recall 3 2  Language- name 2 objects 2 2  Language- repeat 1 1  Language- follow 3 step command 3 3  Language- read & follow direction 1 1  Write a sentence 1 1  Copy design 1 1  Total score 27 28      12/07/2021   11:07 AM 12/01/2020    9:50 AM 11/20/2018   10:42 AM 11/14/2017   10:26 AM 11/11/2016    8:56 AM  6CIT Screen  What Year? 0 points 0 points  0 points 0 points 0 points  What month? 0 points 0 points 0 points 0 points 0 points  What time?  0 points 0 points 0 points 0 points  Count back from 20 0 points 0 points 0 points 0 points 0 points  Months in reverse 4 points 0 points 0 points 0 points 0 points  Repeat phrase 0 points 2 points 2 points 2 points 0 points  Total Score  2 points 2 points 2 points 0 points       Medications: Outpatient Medications Prior to Visit  Medication Sig   benazepril (LOTENSIN) 40 MG tablet Take 40 mg by mouth daily.    buPROPion (WELLBUTRIN XL) 150 MG 24 hr tablet Take 1 tablet (150 mg total) by mouth daily.   calcitRIOL (ROCALTROL) 0.25 MCG capsule Take 0.25 mcg by mouth daily.   doxepin (SINEQUAN) 100 MG capsule Take 1 capsule (100 mg total) by mouth at bedtime.   hydrALAZINE (APRESOLINE) 100 MG tablet Take 100 mg by mouth 3 (three) times daily.   lactulose (CHRONULAC) 10 GM/15ML solution Take  20 g by mouth daily.   lamoTRIgine (LAMICTAL) 25 MG tablet Take 1 tablet (25 mg total) by mouth 2 (two) times daily at 8 am and 4 pm.   lithium carbonate 150 MG capsule Take 1 capsule (150 mg total) by mouth daily with supper.   loperamide (IMODIUM) 2 MG capsule Take 2 mg by mouth daily as needed for diarrhea or loose stools.   loratadine (CLARITIN) 10 MG tablet TAKE 1 TABLET BY MOUTH EVERY DAY   Multiple Vitamin (MULTIVITAMIN) tablet Take 1 tablet by mouth daily.   OLANZapine (ZYPREXA) 10 MG tablet Take 1 tablet (10 mg total) by mouth at bedtime.   rosuvastatin (CRESTOR) 10 MG tablet TAKE 1 TABLET BY MOUTH EVERY DAY   traZODone (DESYREL) 50 MG tablet Take 50 mg by mouth at bedtime as needed for sleep.   Vitamin D, Cholecalciferol, 25 MCG (1000 UT) TABS Take 1,000 tablets by mouth daily.    No facility-administered medications prior to visit.    Review of Systems  Psychiatric/Behavioral:  Positive for dysphoric mood. Negative for agitation, behavioral problems, confusion, hallucinations, sleep disturbance and suicidal ideas.     {Insert previous labs (optional):23779} {See past labs  Heme  Chem  Endocrine  Serology  Results Review (optional):1}   Objective    BP 107/67   Pulse 77   Wt 177 lb 3.2 oz (80.4 kg)   SpO2 98%   BMI 28.60 kg/m  {Insert last BP/Wt (optional):23777}{See vitals history (optional):1}   Physical Exam Vitals reviewed.  Constitutional:      General: He is awake.     Appearance: Normal appearance. He is well-developed and well-groomed.  HENT:     Head: Normocephalic and atraumatic.  Neurological:     Mental Status: He is alert.  Psychiatric:        Attention and Perception: Attention and perception normal.        Mood and Affect: Affect is flat.        Speech: Speech normal.        Behavior: Behavior normal. Behavior is cooperative.        Thought Content: Thought content normal.        Cognition and Memory: Cognition normal.        Judgment:  Judgment normal.       No results found for any visits on 11/05/22.  Assessment & Plan  No follow-ups on file.        Problem List Items Addressed This Visit   None    No follow-ups on file.   I, Madisynn Plair E Kylina Vultaggio, PA-C, have reviewed all documentation for this visit. The documentation on 11/05/22 for the exam, diagnosis, procedures, and orders are all accurate and complete.   Jacquelin Hawking, MHS, PA-C [facility]

## 2022-11-06 DIAGNOSIS — D631 Anemia in chronic kidney disease: Secondary | ICD-10-CM | POA: Diagnosis not present

## 2022-11-06 DIAGNOSIS — D509 Iron deficiency anemia, unspecified: Secondary | ICD-10-CM | POA: Diagnosis not present

## 2022-11-06 DIAGNOSIS — N250 Renal osteodystrophy: Secondary | ICD-10-CM | POA: Diagnosis not present

## 2022-11-06 DIAGNOSIS — N186 End stage renal disease: Secondary | ICD-10-CM | POA: Diagnosis not present

## 2022-11-06 DIAGNOSIS — Z992 Dependence on renal dialysis: Secondary | ICD-10-CM | POA: Diagnosis not present

## 2022-11-07 ENCOUNTER — Encounter (INDEPENDENT_AMBULATORY_CARE_PROVIDER_SITE_OTHER): Payer: Self-pay | Admitting: Vascular Surgery

## 2022-11-08 DIAGNOSIS — N186 End stage renal disease: Secondary | ICD-10-CM | POA: Diagnosis not present

## 2022-11-08 DIAGNOSIS — I700 Atherosclerosis of aorta: Secondary | ICD-10-CM | POA: Diagnosis not present

## 2022-11-08 DIAGNOSIS — D631 Anemia in chronic kidney disease: Secondary | ICD-10-CM | POA: Diagnosis not present

## 2022-11-08 DIAGNOSIS — T82898D Other specified complication of vascular prosthetic devices, implants and grafts, subsequent encounter: Secondary | ICD-10-CM | POA: Diagnosis not present

## 2022-11-08 DIAGNOSIS — F314 Bipolar disorder, current episode depressed, severe, without psychotic features: Secondary | ICD-10-CM | POA: Diagnosis not present

## 2022-11-08 DIAGNOSIS — I120 Hypertensive chronic kidney disease with stage 5 chronic kidney disease or end stage renal disease: Secondary | ICD-10-CM | POA: Diagnosis not present

## 2022-11-08 NOTE — Assessment & Plan Note (Signed)
Chronic, ongoing Patient is followed by Psychiatry for medication management and was recently started on Lithium to manage his Bipolar 1 disorder. He is also taking other medications- Lamictal 25 mg PO BID, Wellbutrin 150 mg PO every day, Zyprexa 10 mg pO at bedtime per chart review We discussed that his medications, recent ECT therapies, persistent depression, may all be contributing to his perceived memory changes His MMSE was 27 today which is largely unchanged from 2018. Reviewed his 6CIT results which also do not appear to be fluctuating. Will send message to his Psychiatry provider to discuss if memory concerns may be addressed with medication adjustments or if he may need to start Donepezil or memantine.  Recommend follow up in about 3 months or sooner if concerns arise.

## 2022-11-09 DIAGNOSIS — N186 End stage renal disease: Secondary | ICD-10-CM | POA: Diagnosis not present

## 2022-11-09 DIAGNOSIS — N250 Renal osteodystrophy: Secondary | ICD-10-CM | POA: Diagnosis not present

## 2022-11-09 DIAGNOSIS — Z992 Dependence on renal dialysis: Secondary | ICD-10-CM | POA: Diagnosis not present

## 2022-11-09 DIAGNOSIS — D631 Anemia in chronic kidney disease: Secondary | ICD-10-CM | POA: Diagnosis not present

## 2022-11-09 DIAGNOSIS — D509 Iron deficiency anemia, unspecified: Secondary | ICD-10-CM | POA: Diagnosis not present

## 2022-11-11 DIAGNOSIS — D509 Iron deficiency anemia, unspecified: Secondary | ICD-10-CM | POA: Diagnosis not present

## 2022-11-11 DIAGNOSIS — Z992 Dependence on renal dialysis: Secondary | ICD-10-CM | POA: Diagnosis not present

## 2022-11-11 DIAGNOSIS — D631 Anemia in chronic kidney disease: Secondary | ICD-10-CM | POA: Diagnosis not present

## 2022-11-11 DIAGNOSIS — N186 End stage renal disease: Secondary | ICD-10-CM | POA: Diagnosis not present

## 2022-11-11 DIAGNOSIS — N250 Renal osteodystrophy: Secondary | ICD-10-CM | POA: Diagnosis not present

## 2022-11-12 ENCOUNTER — Telehealth: Payer: Self-pay | Admitting: Psychiatry

## 2022-11-12 NOTE — Telephone Encounter (Signed)
I have reviewed labs-lithium level received from [place] ,Lydia Dated 11/04/2022-0.33-subtherapeutic. However will continue to keep the patient on this current dosage for now and will consider dosage readjustment as needed in the future.  Patient currently on dialysis.

## 2022-11-13 DIAGNOSIS — D509 Iron deficiency anemia, unspecified: Secondary | ICD-10-CM | POA: Diagnosis not present

## 2022-11-13 DIAGNOSIS — N250 Renal osteodystrophy: Secondary | ICD-10-CM | POA: Diagnosis not present

## 2022-11-13 DIAGNOSIS — N186 End stage renal disease: Secondary | ICD-10-CM | POA: Diagnosis not present

## 2022-11-13 DIAGNOSIS — D631 Anemia in chronic kidney disease: Secondary | ICD-10-CM | POA: Diagnosis not present

## 2022-11-13 DIAGNOSIS — Z992 Dependence on renal dialysis: Secondary | ICD-10-CM | POA: Diagnosis not present

## 2022-11-15 DIAGNOSIS — F314 Bipolar disorder, current episode depressed, severe, without psychotic features: Secondary | ICD-10-CM | POA: Diagnosis not present

## 2022-11-15 DIAGNOSIS — N186 End stage renal disease: Secondary | ICD-10-CM | POA: Diagnosis not present

## 2022-11-15 DIAGNOSIS — I120 Hypertensive chronic kidney disease with stage 5 chronic kidney disease or end stage renal disease: Secondary | ICD-10-CM | POA: Diagnosis not present

## 2022-11-15 DIAGNOSIS — D631 Anemia in chronic kidney disease: Secondary | ICD-10-CM | POA: Diagnosis not present

## 2022-11-15 DIAGNOSIS — I700 Atherosclerosis of aorta: Secondary | ICD-10-CM | POA: Diagnosis not present

## 2022-11-15 DIAGNOSIS — T82898D Other specified complication of vascular prosthetic devices, implants and grafts, subsequent encounter: Secondary | ICD-10-CM | POA: Diagnosis not present

## 2022-11-16 DIAGNOSIS — N250 Renal osteodystrophy: Secondary | ICD-10-CM | POA: Diagnosis not present

## 2022-11-16 DIAGNOSIS — D509 Iron deficiency anemia, unspecified: Secondary | ICD-10-CM | POA: Diagnosis not present

## 2022-11-16 DIAGNOSIS — N186 End stage renal disease: Secondary | ICD-10-CM | POA: Diagnosis not present

## 2022-11-16 DIAGNOSIS — D631 Anemia in chronic kidney disease: Secondary | ICD-10-CM | POA: Diagnosis not present

## 2022-11-16 DIAGNOSIS — Z992 Dependence on renal dialysis: Secondary | ICD-10-CM | POA: Diagnosis not present

## 2022-11-18 ENCOUNTER — Other Ambulatory Visit
Admission: RE | Admit: 2022-11-18 | Discharge: 2022-11-18 | Disposition: A | Discharge status: Home / Self Care | Payer: Medicare Other | Source: Ambulatory Visit | Attending: Nephrology | Admitting: Nephrology

## 2022-11-18 DIAGNOSIS — E569 Vitamin deficiency, unspecified: Secondary | ICD-10-CM | POA: Diagnosis not present

## 2022-11-18 DIAGNOSIS — G9341 Metabolic encephalopathy: Secondary | ICD-10-CM | POA: Diagnosis not present

## 2022-11-18 DIAGNOSIS — S0181XA Laceration without foreign body of other part of head, initial encounter: Secondary | ICD-10-CM | POA: Diagnosis not present

## 2022-11-18 DIAGNOSIS — G4089 Other seizures: Secondary | ICD-10-CM | POA: Diagnosis not present

## 2022-11-18 DIAGNOSIS — N400 Enlarged prostate without lower urinary tract symptoms: Secondary | ICD-10-CM | POA: Diagnosis present

## 2022-11-18 DIAGNOSIS — R000 Tachycardia, unspecified: Secondary | ICD-10-CM | POA: Diagnosis not present

## 2022-11-18 DIAGNOSIS — Z85820 Personal history of malignant melanoma of skin: Secondary | ICD-10-CM | POA: Diagnosis not present

## 2022-11-18 DIAGNOSIS — N186 End stage renal disease: Secondary | ICD-10-CM | POA: Diagnosis not present

## 2022-11-18 DIAGNOSIS — Y92009 Unspecified place in unspecified non-institutional (private) residence as the place of occurrence of the external cause: Secondary | ICD-10-CM | POA: Diagnosis not present

## 2022-11-18 DIAGNOSIS — E162 Hypoglycemia, unspecified: Secondary | ICD-10-CM | POA: Diagnosis not present

## 2022-11-18 DIAGNOSIS — Z992 Dependence on renal dialysis: Secondary | ICD-10-CM | POA: Diagnosis not present

## 2022-11-18 DIAGNOSIS — Y92003 Bedroom of unspecified non-institutional (private) residence as the place of occurrence of the external cause: Secondary | ICD-10-CM | POA: Diagnosis not present

## 2022-11-18 DIAGNOSIS — E876 Hypokalemia: Secondary | ICD-10-CM | POA: Diagnosis present

## 2022-11-18 DIAGNOSIS — T796XXS Traumatic ischemia of muscle, sequela: Secondary | ICD-10-CM | POA: Diagnosis not present

## 2022-11-18 DIAGNOSIS — Z87891 Personal history of nicotine dependence: Secondary | ICD-10-CM | POA: Diagnosis not present

## 2022-11-18 DIAGNOSIS — W19XXXA Unspecified fall, initial encounter: Secondary | ICD-10-CM | POA: Diagnosis not present

## 2022-11-18 DIAGNOSIS — R1311 Dysphagia, oral phase: Secondary | ICD-10-CM | POA: Diagnosis not present

## 2022-11-18 DIAGNOSIS — R42 Dizziness and giddiness: Secondary | ICD-10-CM | POA: Diagnosis not present

## 2022-11-18 DIAGNOSIS — I9589 Other hypotension: Secondary | ICD-10-CM | POA: Diagnosis not present

## 2022-11-18 DIAGNOSIS — R310 Gross hematuria: Secondary | ICD-10-CM | POA: Diagnosis not present

## 2022-11-18 DIAGNOSIS — G4700 Insomnia, unspecified: Secondary | ICD-10-CM | POA: Diagnosis not present

## 2022-11-18 DIAGNOSIS — R4182 Altered mental status, unspecified: Secondary | ICD-10-CM | POA: Diagnosis not present

## 2022-11-18 DIAGNOSIS — D509 Iron deficiency anemia, unspecified: Secondary | ICD-10-CM | POA: Diagnosis not present

## 2022-11-18 DIAGNOSIS — F319 Bipolar disorder, unspecified: Secondary | ICD-10-CM | POA: Diagnosis present

## 2022-11-18 DIAGNOSIS — N10 Acute tubulo-interstitial nephritis: Secondary | ICD-10-CM | POA: Diagnosis present

## 2022-11-18 DIAGNOSIS — W06XXXA Fall from bed, initial encounter: Secondary | ICD-10-CM | POA: Diagnosis present

## 2022-11-18 DIAGNOSIS — F3289 Other specified depressive episodes: Secondary | ICD-10-CM | POA: Diagnosis not present

## 2022-11-18 DIAGNOSIS — E1122 Type 2 diabetes mellitus with diabetic chronic kidney disease: Secondary | ICD-10-CM | POA: Diagnosis present

## 2022-11-18 DIAGNOSIS — A4151 Sepsis due to Escherichia coli [E. coli]: Secondary | ICD-10-CM | POA: Diagnosis present

## 2022-11-18 DIAGNOSIS — I7300 Raynaud's syndrome without gangrene: Secondary | ICD-10-CM | POA: Diagnosis not present

## 2022-11-18 DIAGNOSIS — A419 Sepsis, unspecified organism: Secondary | ICD-10-CM | POA: Diagnosis not present

## 2022-11-18 DIAGNOSIS — E559 Vitamin D deficiency, unspecified: Secondary | ICD-10-CM | POA: Diagnosis not present

## 2022-11-18 DIAGNOSIS — Z85828 Personal history of other malignant neoplasm of skin: Secondary | ICD-10-CM | POA: Diagnosis not present

## 2022-11-18 DIAGNOSIS — E785 Hyperlipidemia, unspecified: Secondary | ICD-10-CM | POA: Diagnosis present

## 2022-11-18 DIAGNOSIS — J99 Respiratory disorders in diseases classified elsewhere: Secondary | ICD-10-CM | POA: Diagnosis not present

## 2022-11-18 DIAGNOSIS — R2681 Unsteadiness on feet: Secondary | ICD-10-CM | POA: Diagnosis not present

## 2022-11-18 DIAGNOSIS — R6520 Severe sepsis without septic shock: Secondary | ICD-10-CM | POA: Diagnosis not present

## 2022-11-18 DIAGNOSIS — K5090 Crohn's disease, unspecified, without complications: Secondary | ICD-10-CM | POA: Diagnosis present

## 2022-11-18 DIAGNOSIS — F329 Major depressive disorder, single episode, unspecified: Secondary | ICD-10-CM | POA: Diagnosis not present

## 2022-11-18 DIAGNOSIS — M6281 Muscle weakness (generalized): Secondary | ICD-10-CM | POA: Diagnosis not present

## 2022-11-18 DIAGNOSIS — N390 Urinary tract infection, site not specified: Secondary | ICD-10-CM | POA: Diagnosis not present

## 2022-11-18 DIAGNOSIS — I959 Hypotension, unspecified: Secondary | ICD-10-CM | POA: Diagnosis not present

## 2022-11-18 DIAGNOSIS — K8020 Calculus of gallbladder without cholecystitis without obstruction: Secondary | ICD-10-CM | POA: Diagnosis not present

## 2022-11-18 DIAGNOSIS — I700 Atherosclerosis of aorta: Secondary | ICD-10-CM | POA: Diagnosis present

## 2022-11-18 DIAGNOSIS — R41841 Cognitive communication deficit: Secondary | ICD-10-CM | POA: Diagnosis not present

## 2022-11-18 DIAGNOSIS — K5730 Diverticulosis of large intestine without perforation or abscess without bleeding: Secondary | ICD-10-CM | POA: Diagnosis not present

## 2022-11-18 DIAGNOSIS — I120 Hypertensive chronic kidney disease with stage 5 chronic kidney disease or end stage renal disease: Secondary | ICD-10-CM | POA: Diagnosis present

## 2022-11-18 DIAGNOSIS — I517 Cardiomegaly: Secondary | ICD-10-CM | POA: Diagnosis not present

## 2022-11-18 DIAGNOSIS — N250 Renal osteodystrophy: Secondary | ICD-10-CM | POA: Diagnosis not present

## 2022-11-18 DIAGNOSIS — G4733 Obstructive sleep apnea (adult) (pediatric): Secondary | ICD-10-CM | POA: Diagnosis not present

## 2022-11-18 DIAGNOSIS — N2581 Secondary hyperparathyroidism of renal origin: Secondary | ICD-10-CM | POA: Diagnosis not present

## 2022-11-18 DIAGNOSIS — R7889 Finding of other specified substances, not normally found in blood: Secondary | ICD-10-CM | POA: Insufficient documentation

## 2022-11-18 DIAGNOSIS — D638 Anemia in other chronic diseases classified elsewhere: Secondary | ICD-10-CM | POA: Diagnosis not present

## 2022-11-18 DIAGNOSIS — I771 Stricture of artery: Secondary | ICD-10-CM | POA: Diagnosis not present

## 2022-11-18 DIAGNOSIS — J309 Allergic rhinitis, unspecified: Secondary | ICD-10-CM | POA: Diagnosis not present

## 2022-11-18 DIAGNOSIS — K5909 Other constipation: Secondary | ICD-10-CM | POA: Diagnosis not present

## 2022-11-18 DIAGNOSIS — S2243XA Multiple fractures of ribs, bilateral, initial encounter for closed fracture: Secondary | ICD-10-CM | POA: Diagnosis not present

## 2022-11-18 DIAGNOSIS — D696 Thrombocytopenia, unspecified: Secondary | ICD-10-CM | POA: Diagnosis present

## 2022-11-18 DIAGNOSIS — E11649 Type 2 diabetes mellitus with hypoglycemia without coma: Secondary | ICD-10-CM | POA: Diagnosis not present

## 2022-11-18 DIAGNOSIS — D649 Anemia, unspecified: Secondary | ICD-10-CM | POA: Diagnosis not present

## 2022-11-18 DIAGNOSIS — R197 Diarrhea, unspecified: Secondary | ICD-10-CM | POA: Diagnosis not present

## 2022-11-18 DIAGNOSIS — M109 Gout, unspecified: Secondary | ICD-10-CM | POA: Diagnosis present

## 2022-11-18 DIAGNOSIS — Z8616 Personal history of COVID-19: Secondary | ICD-10-CM | POA: Diagnosis not present

## 2022-11-18 DIAGNOSIS — R55 Syncope and collapse: Secondary | ICD-10-CM | POA: Diagnosis not present

## 2022-11-18 DIAGNOSIS — D631 Anemia in chronic kidney disease: Secondary | ICD-10-CM | POA: Diagnosis not present

## 2022-11-18 DIAGNOSIS — M6259 Muscle wasting and atrophy, not elsewhere classified, multiple sites: Secondary | ICD-10-CM | POA: Diagnosis not present

## 2022-11-18 LAB — LITHIUM LEVEL: Lithium Lvl: 0.32 mmol/L — ABNORMAL LOW (ref 0.60–1.20)

## 2022-11-19 ENCOUNTER — Ambulatory Visit (INDEPENDENT_AMBULATORY_CARE_PROVIDER_SITE_OTHER): Payer: Medicare Other | Admitting: Urology

## 2022-11-19 ENCOUNTER — Telehealth: Payer: Self-pay | Admitting: Psychiatry

## 2022-11-19 ENCOUNTER — Encounter: Payer: Self-pay | Admitting: Urology

## 2022-11-19 VITALS — BP 85/41 | HR 102 | Ht 66.0 in | Wt 177.0 lb

## 2022-11-19 DIAGNOSIS — R310 Gross hematuria: Secondary | ICD-10-CM | POA: Diagnosis not present

## 2022-11-19 NOTE — Telephone Encounter (Signed)
I have reviewed labs received from [facility] -lithium level-0.32.

## 2022-11-19 NOTE — Progress Notes (Signed)
   11/19/22  CC:  Chief Complaint  Patient presents with   Cysto    HPI: Refer to [facility]'s note 10/13/2022.  CT abdomen pelvis without contrast was performed secondary to CKD.  There was mild to moderate renal cortical atrophy and bilateral simple renal cysts.  Denies recurrent gross hematuria  Blood pressure (!) 85/41, pulse (!) 102, height 5\' 6"  (1.676 m), weight 177 lb (80.3 kg). NED. A&Ox3.   No respiratory distress   Abd soft, NT, ND Normal phallus with bilateral descended testicles  Cystoscopy Procedure Note  Patient identification was confirmed, informed consent was obtained, and patient was prepped using Betadine solution.  Lidocaine jelly was administered per urethral meatus.     Pre-Procedure: - Inspection reveals a normal caliber urethral meatus.  Procedure: The flexible cystoscope was introduced without difficulty - No urethral strictures/lesions are present. -  Prominent lateral lobe enlargement  prostate with marked hypervascularity/friability - Normal bladder neck - Bilateral ureteral orifices identified - Bladder mucosa  reveals no ulcers, tumors, or lesions - No bladder stones -Moderate trabeculation  Retroflexion shows backbleeding from prostatic urethra.  No lesions or intravesical median lobe   Post-Procedure: - Patient tolerated the procedure well  Assessment/ Plan: No bladder mucosal lesions BPH most likely etiology of hematuria States he has had no recent hematuria and recommend 5-ARI should episodes increase Keep scheduled follow-up with Onalee Hua, MD

## 2022-11-20 DIAGNOSIS — D509 Iron deficiency anemia, unspecified: Secondary | ICD-10-CM | POA: Diagnosis not present

## 2022-11-20 DIAGNOSIS — Z992 Dependence on renal dialysis: Secondary | ICD-10-CM | POA: Diagnosis not present

## 2022-11-20 DIAGNOSIS — D631 Anemia in chronic kidney disease: Secondary | ICD-10-CM | POA: Diagnosis not present

## 2022-11-20 DIAGNOSIS — N186 End stage renal disease: Secondary | ICD-10-CM | POA: Diagnosis not present

## 2022-11-20 DIAGNOSIS — N250 Renal osteodystrophy: Secondary | ICD-10-CM | POA: Diagnosis not present

## 2022-11-21 ENCOUNTER — Emergency Department: Payer: Medicare Other

## 2022-11-21 ENCOUNTER — Other Ambulatory Visit: Payer: Self-pay

## 2022-11-21 ENCOUNTER — Encounter: Payer: Self-pay | Admitting: Internal Medicine

## 2022-11-21 ENCOUNTER — Inpatient Hospital Stay: Payer: Medicare Other

## 2022-11-21 ENCOUNTER — Inpatient Hospital Stay
Admission: EM | Admit: 2022-11-21 | Discharge: 2022-11-26 | DRG: 871 | Disposition: A | Discharge status: Skilled Nursing Facility | Payer: Medicare Other | Attending: Internal Medicine | Admitting: Internal Medicine

## 2022-11-21 DIAGNOSIS — K5730 Diverticulosis of large intestine without perforation or abscess without bleeding: Secondary | ICD-10-CM | POA: Diagnosis not present

## 2022-11-21 DIAGNOSIS — Z85820 Personal history of malignant melanoma of skin: Secondary | ICD-10-CM

## 2022-11-21 DIAGNOSIS — I7300 Raynaud's syndrome without gangrene: Secondary | ICD-10-CM | POA: Diagnosis not present

## 2022-11-21 DIAGNOSIS — Z85828 Personal history of other malignant neoplasm of skin: Secondary | ICD-10-CM | POA: Diagnosis not present

## 2022-11-21 DIAGNOSIS — J99 Respiratory disorders in diseases classified elsewhere: Secondary | ICD-10-CM | POA: Diagnosis not present

## 2022-11-21 DIAGNOSIS — G9341 Metabolic encephalopathy: Secondary | ICD-10-CM

## 2022-11-21 DIAGNOSIS — F319 Bipolar disorder, unspecified: Secondary | ICD-10-CM | POA: Diagnosis present

## 2022-11-21 DIAGNOSIS — R2681 Unsteadiness on feet: Secondary | ICD-10-CM | POA: Diagnosis not present

## 2022-11-21 DIAGNOSIS — N400 Enlarged prostate without lower urinary tract symptoms: Secondary | ICD-10-CM | POA: Diagnosis present

## 2022-11-21 DIAGNOSIS — I517 Cardiomegaly: Secondary | ICD-10-CM | POA: Diagnosis not present

## 2022-11-21 DIAGNOSIS — I771 Stricture of artery: Secondary | ICD-10-CM | POA: Diagnosis not present

## 2022-11-21 DIAGNOSIS — D631 Anemia in chronic kidney disease: Secondary | ICD-10-CM | POA: Diagnosis present

## 2022-11-21 DIAGNOSIS — R4182 Altered mental status, unspecified: Secondary | ICD-10-CM | POA: Diagnosis not present

## 2022-11-21 DIAGNOSIS — I120 Hypertensive chronic kidney disease with stage 5 chronic kidney disease or end stage renal disease: Secondary | ICD-10-CM | POA: Diagnosis present

## 2022-11-21 DIAGNOSIS — E1129 Type 2 diabetes mellitus with other diabetic kidney complication: Secondary | ICD-10-CM | POA: Diagnosis present

## 2022-11-21 DIAGNOSIS — Y92009 Unspecified place in unspecified non-institutional (private) residence as the place of occurrence of the external cause: Secondary | ICD-10-CM

## 2022-11-21 DIAGNOSIS — E785 Hyperlipidemia, unspecified: Secondary | ICD-10-CM | POA: Diagnosis present

## 2022-11-21 DIAGNOSIS — S0181XA Laceration without foreign body of other part of head, initial encounter: Secondary | ICD-10-CM | POA: Diagnosis not present

## 2022-11-21 DIAGNOSIS — K5090 Crohn's disease, unspecified, without complications: Secondary | ICD-10-CM | POA: Diagnosis present

## 2022-11-21 DIAGNOSIS — F314 Bipolar disorder, current episode depressed, severe, without psychotic features: Secondary | ICD-10-CM

## 2022-11-21 DIAGNOSIS — N2581 Secondary hyperparathyroidism of renal origin: Secondary | ICD-10-CM | POA: Diagnosis present

## 2022-11-21 DIAGNOSIS — E569 Vitamin deficiency, unspecified: Secondary | ICD-10-CM | POA: Diagnosis not present

## 2022-11-21 DIAGNOSIS — G4089 Other seizures: Secondary | ICD-10-CM | POA: Diagnosis not present

## 2022-11-21 DIAGNOSIS — Z8616 Personal history of COVID-19: Secondary | ICD-10-CM | POA: Diagnosis not present

## 2022-11-21 DIAGNOSIS — Y92003 Bedroom of unspecified non-institutional (private) residence as the place of occurrence of the external cause: Secondary | ICD-10-CM

## 2022-11-21 DIAGNOSIS — T796XXS Traumatic ischemia of muscle, sequela: Secondary | ICD-10-CM | POA: Diagnosis not present

## 2022-11-21 DIAGNOSIS — Z992 Dependence on renal dialysis: Secondary | ICD-10-CM | POA: Diagnosis not present

## 2022-11-21 DIAGNOSIS — R197 Diarrhea, unspecified: Secondary | ICD-10-CM | POA: Diagnosis not present

## 2022-11-21 DIAGNOSIS — K5909 Other constipation: Secondary | ICD-10-CM | POA: Diagnosis not present

## 2022-11-21 DIAGNOSIS — R000 Tachycardia, unspecified: Secondary | ICD-10-CM | POA: Diagnosis not present

## 2022-11-21 DIAGNOSIS — Z87891 Personal history of nicotine dependence: Secondary | ICD-10-CM

## 2022-11-21 DIAGNOSIS — E663 Overweight: Secondary | ICD-10-CM | POA: Insufficient documentation

## 2022-11-21 DIAGNOSIS — I700 Atherosclerosis of aorta: Secondary | ICD-10-CM | POA: Diagnosis present

## 2022-11-21 DIAGNOSIS — M6281 Muscle weakness (generalized): Secondary | ICD-10-CM | POA: Diagnosis not present

## 2022-11-21 DIAGNOSIS — R1311 Dysphagia, oral phase: Secondary | ICD-10-CM | POA: Diagnosis not present

## 2022-11-21 DIAGNOSIS — W06XXXA Fall from bed, initial encounter: Secondary | ICD-10-CM | POA: Diagnosis present

## 2022-11-21 DIAGNOSIS — E11649 Type 2 diabetes mellitus with hypoglycemia without coma: Secondary | ICD-10-CM | POA: Diagnosis not present

## 2022-11-21 DIAGNOSIS — I10 Essential (primary) hypertension: Secondary | ICD-10-CM | POA: Diagnosis present

## 2022-11-21 DIAGNOSIS — D696 Thrombocytopenia, unspecified: Secondary | ICD-10-CM | POA: Diagnosis present

## 2022-11-21 DIAGNOSIS — G4700 Insomnia, unspecified: Secondary | ICD-10-CM | POA: Diagnosis not present

## 2022-11-21 DIAGNOSIS — N186 End stage renal disease: Secondary | ICD-10-CM

## 2022-11-21 DIAGNOSIS — N10 Acute tubulo-interstitial nephritis: Secondary | ICD-10-CM | POA: Insufficient documentation

## 2022-11-21 DIAGNOSIS — N390 Urinary tract infection, site not specified: Secondary | ICD-10-CM | POA: Diagnosis not present

## 2022-11-21 DIAGNOSIS — Z6828 Body mass index (BMI) 28.0-28.9, adult: Secondary | ICD-10-CM

## 2022-11-21 DIAGNOSIS — K8020 Calculus of gallbladder without cholecystitis without obstruction: Secondary | ICD-10-CM | POA: Diagnosis not present

## 2022-11-21 DIAGNOSIS — Z974 Presence of external hearing-aid: Secondary | ICD-10-CM

## 2022-11-21 DIAGNOSIS — R6520 Severe sepsis without septic shock: Secondary | ICD-10-CM | POA: Diagnosis present

## 2022-11-21 DIAGNOSIS — A419 Sepsis, unspecified organism: Secondary | ICD-10-CM | POA: Diagnosis present

## 2022-11-21 DIAGNOSIS — R7989 Other specified abnormal findings of blood chemistry: Secondary | ICD-10-CM | POA: Diagnosis not present

## 2022-11-21 DIAGNOSIS — T796XXA Traumatic ischemia of muscle, initial encounter: Secondary | ICD-10-CM | POA: Diagnosis present

## 2022-11-21 DIAGNOSIS — Z96652 Presence of left artificial knee joint: Secondary | ICD-10-CM | POA: Diagnosis present

## 2022-11-21 DIAGNOSIS — D638 Anemia in other chronic diseases classified elsewhere: Secondary | ICD-10-CM | POA: Insufficient documentation

## 2022-11-21 DIAGNOSIS — E1122 Type 2 diabetes mellitus with diabetic chronic kidney disease: Secondary | ICD-10-CM | POA: Diagnosis present

## 2022-11-21 DIAGNOSIS — R300 Dysuria: Secondary | ICD-10-CM | POA: Diagnosis present

## 2022-11-21 DIAGNOSIS — R42 Dizziness and giddiness: Secondary | ICD-10-CM | POA: Diagnosis not present

## 2022-11-21 DIAGNOSIS — F3289 Other specified depressive episodes: Secondary | ICD-10-CM | POA: Diagnosis not present

## 2022-11-21 DIAGNOSIS — D649 Anemia, unspecified: Secondary | ICD-10-CM | POA: Diagnosis present

## 2022-11-21 DIAGNOSIS — S2243XA Multiple fractures of ribs, bilateral, initial encounter for closed fracture: Secondary | ICD-10-CM | POA: Diagnosis not present

## 2022-11-21 DIAGNOSIS — F329 Major depressive disorder, single episode, unspecified: Secondary | ICD-10-CM | POA: Diagnosis not present

## 2022-11-21 DIAGNOSIS — M109 Gout, unspecified: Secondary | ICD-10-CM | POA: Diagnosis present

## 2022-11-21 DIAGNOSIS — J309 Allergic rhinitis, unspecified: Secondary | ICD-10-CM | POA: Diagnosis not present

## 2022-11-21 DIAGNOSIS — I959 Hypotension, unspecified: Secondary | ICD-10-CM | POA: Diagnosis present

## 2022-11-21 DIAGNOSIS — G4733 Obstructive sleep apnea (adult) (pediatric): Secondary | ICD-10-CM | POA: Diagnosis present

## 2022-11-21 DIAGNOSIS — E559 Vitamin D deficiency, unspecified: Secondary | ICD-10-CM | POA: Diagnosis not present

## 2022-11-21 DIAGNOSIS — Z8673 Personal history of transient ischemic attack (TIA), and cerebral infarction without residual deficits: Secondary | ICD-10-CM

## 2022-11-21 DIAGNOSIS — A4151 Sepsis due to Escherichia coli [E. coli]: Principal | ICD-10-CM | POA: Diagnosis present

## 2022-11-21 DIAGNOSIS — I9589 Other hypotension: Secondary | ICD-10-CM | POA: Diagnosis not present

## 2022-11-21 DIAGNOSIS — R400 Somnolence: Secondary | ICD-10-CM | POA: Diagnosis present

## 2022-11-21 DIAGNOSIS — E876 Hypokalemia: Secondary | ICD-10-CM | POA: Diagnosis present

## 2022-11-21 DIAGNOSIS — R310 Gross hematuria: Secondary | ICD-10-CM | POA: Diagnosis not present

## 2022-11-21 DIAGNOSIS — Z888 Allergy status to other drugs, medicaments and biological substances status: Secondary | ICD-10-CM

## 2022-11-21 DIAGNOSIS — E162 Hypoglycemia, unspecified: Secondary | ICD-10-CM | POA: Diagnosis not present

## 2022-11-21 DIAGNOSIS — K5900 Constipation, unspecified: Secondary | ICD-10-CM | POA: Insufficient documentation

## 2022-11-21 DIAGNOSIS — M6282 Rhabdomyolysis: Secondary | ICD-10-CM | POA: Diagnosis present

## 2022-11-21 DIAGNOSIS — W19XXXA Unspecified fall, initial encounter: Secondary | ICD-10-CM | POA: Diagnosis not present

## 2022-11-21 DIAGNOSIS — R41841 Cognitive communication deficit: Secondary | ICD-10-CM | POA: Diagnosis not present

## 2022-11-21 DIAGNOSIS — M6259 Muscle wasting and atrophy, not elsewhere classified, multiple sites: Secondary | ICD-10-CM | POA: Diagnosis not present

## 2022-11-21 DIAGNOSIS — R55 Syncope and collapse: Secondary | ICD-10-CM | POA: Diagnosis not present

## 2022-11-21 DIAGNOSIS — S0081XA Abrasion of other part of head, initial encounter: Secondary | ICD-10-CM | POA: Diagnosis present

## 2022-11-21 LAB — URINALYSIS, W/ REFLEX TO CULTURE (INFECTION SUSPECTED)
Bilirubin Urine: NEGATIVE
Glucose, UA: NEGATIVE mg/dL
Ketones, ur: NEGATIVE mg/dL
Nitrite: NEGATIVE
Protein, ur: >300 mg/dL — AB
RBC / HPF: >50 RBC/hpf (ref 0–5)
Specific Gravity, Urine: 1.02 (ref 1.005–1.030)
Squamous Epithelial / HPF: NONE SEEN /HPF (ref 0–5)
WBC, UA: >50 WBC/hpf (ref 0–5)
pH: 8.5 — ABNORMAL HIGH (ref 5.0–8.0)

## 2022-11-21 LAB — RESP PANEL BY RT-PCR (RSV, FLU A&B, COVID)  RVPGX2
Influenza A by PCR: NEGATIVE
Influenza B by PCR: NEGATIVE
Resp Syncytial Virus by PCR: NEGATIVE
SARS Coronavirus 2 by RT PCR: NEGATIVE

## 2022-11-21 LAB — TYPE AND SCREEN
ABO/RH(D): A POS
Antibody Screen: NEGATIVE

## 2022-11-21 LAB — COMPREHENSIVE METABOLIC PANEL
ALT: 32 U/L (ref 0–44)
AST: 129 U/L — ABNORMAL HIGH (ref 15–41)
Albumin: 3.8 g/dL (ref 3.5–5.0)
Alkaline Phosphatase: 88 U/L (ref 38–126)
Anion gap: 16 — ABNORMAL HIGH (ref 5–15)
BUN: 41 mg/dL — ABNORMAL HIGH (ref 8–23)
CO2: 28 mmol/L (ref 22–32)
Calcium: 9.2 mg/dL (ref 8.9–10.3)
Chloride: 94 mmol/L — ABNORMAL LOW (ref 98–111)
Creatinine, Ser: 6.33 mg/dL — ABNORMAL HIGH (ref 0.61–1.24)
GFR, Estimated: 8 mL/min — ABNORMAL LOW (ref >60)
Glucose, Bld: 117 mg/dL — ABNORMAL HIGH (ref 70–99)
Potassium: 3.6 mmol/L (ref 3.5–5.1)
Sodium: 138 mmol/L (ref 135–145)
Total Bilirubin: 1.1 mg/dL (ref 0.3–1.2)
Total Protein: 7.5 g/dL (ref 6.5–8.1)

## 2022-11-21 LAB — CK: Total CK: 6869 U/L — ABNORMAL HIGH (ref 49–397)

## 2022-11-21 LAB — CBC WITH DIFFERENTIAL/PLATELET
Abs Immature Granulocytes: 0.06 10*3/uL (ref 0.00–0.07)
Basophils Absolute: 0 10*3/uL (ref 0.0–0.1)
Basophils Relative: 0 %
Eosinophils Absolute: 0 10*3/uL (ref 0.0–0.5)
Eosinophils Relative: 0 %
HCT: 28.5 % — ABNORMAL LOW (ref 39.0–52.0)
Hemoglobin: 9.4 g/dL — ABNORMAL LOW (ref 13.0–17.0)
Immature Granulocytes: 1 %
Lymphocytes Relative: 3 %
Lymphs Abs: 0.4 10*3/uL — ABNORMAL LOW (ref 0.7–4.0)
MCH: 34.6 pg — ABNORMAL HIGH (ref 26.0–34.0)
MCHC: 33 g/dL (ref 30.0–36.0)
MCV: 104.8 fL — ABNORMAL HIGH (ref 80.0–100.0)
Monocytes Absolute: 0.6 10*3/uL (ref 0.1–1.0)
Monocytes Relative: 6 %
Neutro Abs: 9.9 10*3/uL — ABNORMAL HIGH (ref 1.7–7.7)
Neutrophils Relative %: 90 %
Platelets: 110 10*3/uL — ABNORMAL LOW (ref 150–400)
RBC: 2.72 MIL/uL — ABNORMAL LOW (ref 4.22–5.81)
RDW: 13 % (ref 11.5–15.5)
WBC: 11 10*3/uL — ABNORMAL HIGH (ref 4.0–10.5)
nRBC: 0 % (ref 0.0–0.2)

## 2022-11-21 LAB — LITHIUM LEVEL: Lithium Lvl: 0.36 mmol/L — ABNORMAL LOW (ref 0.60–1.20)

## 2022-11-21 LAB — PROTIME-INR
INR: 1.3 — ABNORMAL HIGH (ref 0.8–1.2)
Prothrombin Time: 16.5 s — ABNORMAL HIGH (ref 11.4–15.2)

## 2022-11-21 LAB — LACTIC ACID, PLASMA: Lactic Acid, Venous: 1.1 mmol/L (ref 0.5–1.9)

## 2022-11-21 LAB — TROPONIN I (HIGH SENSITIVITY)
Troponin I (High Sensitivity): 136 ng/L (ref <18)
Troponin I (High Sensitivity): 147 ng/L (ref <18)
Troponin I (High Sensitivity): 176 ng/L (ref <18)

## 2022-11-21 LAB — APTT: aPTT: 33 s (ref 24–36)

## 2022-11-21 MED ORDER — BUPROPION HCL ER (XL) 150 MG PO TB24
150.0000 mg | ORAL_TABLET | Freq: Every day | ORAL | Status: DC
  Administered 2022-11-22 – 2022-11-26 (×5): 150 mg via ORAL
  Filled 2022-11-21 (×6): qty 1

## 2022-11-21 MED ORDER — HYDRALAZINE HCL 50 MG PO TABS: 100.0000 mg | ORAL_TABLET | Freq: Three times a day (TID) | ORAL | Status: DC

## 2022-11-21 MED ORDER — LACTATED RINGERS IV BOLUS
250.0000 mL | Freq: Once | INTRAVENOUS | Status: AC
  Administered 2022-11-21: 250 mL via INTRAVENOUS

## 2022-11-21 MED ORDER — DOXEPIN HCL 100 MG PO CAPS
100.0000 mg | ORAL_CAPSULE | Freq: Every day | ORAL | Status: DC
  Administered 2022-11-21: 100 mg via ORAL
  Filled 2022-11-21: qty 1

## 2022-11-21 MED ORDER — SODIUM CHLORIDE 0.9 % IV SOLN: 1.0000 g | INTRAVENOUS | Status: DC

## 2022-11-21 MED ORDER — ACETAMINOPHEN 325 MG PO TABS
650.0000 mg | ORAL_TABLET | Freq: Once | ORAL | Status: AC
  Administered 2022-11-21: 650 mg via ORAL
  Filled 2022-11-21: qty 2

## 2022-11-21 MED ORDER — LACTATED RINGERS IV BOLUS: 1000.0000 mL | Freq: Once | INTRAVENOUS | Status: DC

## 2022-11-21 MED ORDER — NEPRO/CARBSTEADY PO LIQD
237.0000 mL | Freq: Two times a day (BID) | ORAL | Status: DC
  Administered 2022-11-25 – 2022-11-26 (×2): 237 mL via ORAL

## 2022-11-21 MED ORDER — ACETAMINOPHEN 650 MG RE SUPP: 650.0000 mg | Freq: Four times a day (QID) | RECTAL | Status: DC | PRN

## 2022-11-21 MED ORDER — LACTATED RINGERS IV SOLN: 150.0000 mL/h | INTRAVENOUS | Status: DC

## 2022-11-21 MED ORDER — HEPARIN SODIUM (PORCINE) 5000 UNIT/ML IJ SOLN
5000.0000 [IU] | Freq: Two times a day (BID) | INTRAMUSCULAR | Status: DC
  Filled 2022-11-21: qty 1

## 2022-11-21 MED ORDER — LITHIUM CARBONATE 150 MG PO CAPS
150.0000 mg | ORAL_CAPSULE | Freq: Every day | ORAL | Status: DC
  Administered 2022-11-22 – 2022-11-25 (×4): 150 mg via ORAL
  Filled 2022-11-21 (×5): qty 1

## 2022-11-21 MED ORDER — SODIUM CHLORIDE 0.9 % IV SOLN
2.0000 g | Freq: Once | INTRAVENOUS | Status: AC
  Administered 2022-11-21: 2 g via INTRAVENOUS
  Filled 2022-11-21: qty 12.5

## 2022-11-21 MED ORDER — CALCITRIOL 0.25 MCG PO CAPS
0.2500 ug | ORAL_CAPSULE | Freq: Every day | ORAL | Status: DC
  Administered 2022-11-22 – 2022-11-26 (×5): 0.25 ug via ORAL
  Filled 2022-11-21 (×5): qty 1

## 2022-11-21 MED ORDER — LACTATED RINGERS IV SOLN: INTRAVENOUS | Status: DC

## 2022-11-21 MED ORDER — LACTATED RINGERS IV SOLN: Freq: Once | INTRAVENOUS | Status: AC

## 2022-11-21 MED ORDER — OLANZAPINE 10 MG PO TABS
10.0000 mg | ORAL_TABLET | Freq: Every day | ORAL | Status: DC
  Administered 2022-11-21 – 2022-11-25 (×4): 10 mg via ORAL
  Filled 2022-11-21 (×6): qty 1

## 2022-11-21 MED ORDER — VITAMIN D 25 MCG (1000 UNIT) PO TABS
1000.0000 [IU] | ORAL_TABLET | Freq: Every day | ORAL | Status: DC
  Administered 2022-11-22 – 2022-11-26 (×5): 1000 [IU] via ORAL
  Filled 2022-11-21 (×6): qty 1

## 2022-11-21 MED ORDER — LACTATED RINGERS IV SOLN: Freq: Once | INTRAVENOUS | Status: DC

## 2022-11-21 MED ORDER — IBUPROFEN 800 MG PO TABS
800.0000 mg | ORAL_TABLET | Freq: Once | ORAL | Status: AC
  Administered 2022-11-21: 800 mg via ORAL
  Filled 2022-11-21: qty 1

## 2022-11-21 MED ORDER — TRAZODONE HCL 50 MG PO TABS: 50.0000 mg | ORAL_TABLET | Freq: Every evening | ORAL | Status: DC | PRN

## 2022-11-21 MED ORDER — SODIUM CHLORIDE 0.9 % IV BOLUS (SEPSIS)
1000.0000 mL | Freq: Once | INTRAVENOUS | Status: AC
  Administered 2022-11-21: 1000 mL via INTRAVENOUS

## 2022-11-21 MED ORDER — SODIUM CHLORIDE 0.9% FLUSH
3.0000 mL | Freq: Two times a day (BID) | INTRAVENOUS | Status: DC
  Administered 2022-11-21 – 2022-11-26 (×10): 3 mL via INTRAVENOUS

## 2022-11-21 MED ORDER — ACETAMINOPHEN 325 MG PO TABS: 650.0000 mg | ORAL_TABLET | Freq: Four times a day (QID) | ORAL | Status: DC | PRN

## 2022-11-21 MED ORDER — PANTOPRAZOLE SODIUM 40 MG IV SOLR
40.0000 mg | Freq: Two times a day (BID) | INTRAVENOUS | Status: DC
  Administered 2022-11-21 – 2022-11-23 (×4): 40 mg via INTRAVENOUS
  Filled 2022-11-21 (×4): qty 10

## 2022-11-21 MED ORDER — LAMOTRIGINE 25 MG PO TABS
25.0000 mg | ORAL_TABLET | ORAL | Status: DC
  Administered 2022-11-22 – 2022-11-26 (×8): 25 mg via ORAL
  Filled 2022-11-21 (×9): qty 1

## 2022-11-21 MED ORDER — ROSUVASTATIN CALCIUM 10 MG PO TABS: 10.0000 mg | ORAL_TABLET | Freq: Every day | ORAL | Status: DC

## 2022-11-21 NOTE — Progress Notes (Signed)
The patient is admitted from ED to 2 C 211. A & O x 4, but falls asleep in the middle of conversation. He denies any acute pain. Will try and finish the admission profile.

## 2022-11-21 NOTE — Consult Note (Signed)
Pharmacy Antibiotic Note  Luis Miller. is a 77 y.o. male admitted on 11/21/2022 with UTI. PMH significant for HTN, bipolar disorder, gout, T2DM, ESRD on HD ([facility]), HLD. Patient reports that he recently had a cystoscopy performed due to recurrent UTIs and hematuria. Pharmacy has been consulted for cefepime dosing.  Plan: Day 1 of antibiotics Start cefepime 1 g IV Q24H Continue to monitor renal function and follow culture results   Height: 5\' 6"  (167.6 cm) Weight: 80.3 kg (177 lb) IBW/kg (Calculated) : 63.8  Temp (24hrs), Avg:101.4 F (38.6 C), Min:100 F (37.8 C), Max:102.8 F (39.3 C)  Recent Labs  Lab 11/21/22 1710  WBC 11.0*  CREATININE 6.33*  LATICACIDVEN 1.1    Estimated Creatinine Clearance: 9.6 mL/min (A) (by C-G formula based on SCr of 6.33 mg/dL (H)).    Allergies  Allergen Reactions   Mirtazapine Rash   Aspirin     Other reaction(s): Unknown Patient was instructed not to take aspirin but can not remember why   Atomoxetine Other (See Comments)    Urinary sx   Strattera [Atomoxetine Hcl] Other (See Comments)    Urinary sx   Xyzal [Levocetirizine Dihydrochloride] Rash    Patient reported on 11/02/17    Antimicrobials this admission: 9/1 Cefepime >>   Dose adjustments this admission: N/A  Microbiology results: 9/1 BCx: IP  Thank you for allowing pharmacy to be a part of this patient's care.  Celene Squibb, PharmD Clinical Pharmacist 11/21/2022 9:47 PM

## 2022-11-21 NOTE — Consult Note (Signed)
PHARMACY -  BRIEF ANTIBIOTIC NOTE   Pharmacy has received consult(s) for cefepime from an ED provider.  The patient's profile has been reviewed for ht/wt/allergies/indication/available labs.    One time order(s) placed for cefepime 2g  Further antibiotics/pharmacy consults should be ordered by admitting physician if indicated.                       Thank you, Celene Squibb 11/21/2022  5:53 PM

## 2022-11-21 NOTE — Assessment & Plan Note (Signed)
Pt is very somnolent follows commands is oriented. We will get head ct stat. NPO/ aspiration / fall precaution.  Head ct negative.  We will get blood gas/ ammonia level.

## 2022-11-21 NOTE — ED Provider Notes (Signed)
[facility] Provider Note   Event Date/Time   First MD Initiated Contact with Patient 11/21/22 1707     (approximate) History  Fall (Patient brought in by EMS from home for evaluation after sustaining a fall when he was getting out of bed; EMS states that he became dizzy and fell and was on the floor for approx. 3-4 hours; Upon arrival to ED, patient is A&O x 4 and denies loss of consciousness; He does have a small abrasion to his forehead but denies significant head injury (is not on blood thinners); Denies cardiac/pulmonary history; Recently had cystoscopy performed for reoccuring UTIs and hematuria; Low grade temp. Of 100.7)  HPI Luis Miller. is a 77 y.o. male with a past medical history of hypertension, gout, stroke, prediabetes, Crohn's disease, irritable bowel syndrome, hyperthyroidism who presents via EMS after being found down at home.  Patient was reportedly trying to get out of bed when he began feeling dizziness and fell to the floor approximately 2-3 feet.  Patient states that he was on the floor for 3 to 4 hours before getting to a phone and was able to call 911.  Patient denies any loss of consciousness stating that he was significantly weak and was unable to get off of the floor by himself.  Patient has a small abrasion to his forehead but denies any significant head injury.  Patient states that he recently had a cystoscopy performed due to recurrent UTIs and hematuria.  Patient was found to have a fever to 100.21F.  Patient endorses persistent dysuria ROS: Patient currently denies any vision changes, tinnitus, difficulty speaking, facial droop, sore throat, chest pain, shortness of breath, abdominal pain, nausea/vomiting/diarrhea, or weakness/numbness/paresthesias in any extremity   Physical Exam  Triage Vital Signs: ED Triage Vitals  Encounter Vitals Group     BP 11/21/22 1659 128/70     Systolic BP Percentile --      Diastolic BP Percentile --       Pulse Rate 11/21/22 1659 97     Resp 11/21/22 1659 19     Temp 11/21/22 1659 (!) 100.7 F (38.2 C)     Temp Source 11/21/22 1659 Oral     SpO2 11/21/22 1659 98 %     Weight 11/21/22 1700 177 lb (80.3 kg)     Height 11/21/22 1700 5\' 6"  (1.676 m)     Head Circumference --      Peak Flow --      Pain Score 11/21/22 1700 1     Pain Loc --      Pain Education --      Exclude from Growth Chart --    Most recent vital signs: Vitals:   11/21/22 1900 11/21/22 1902  BP: (!) 153/83 (!) 153/83  Pulse: (!) 102 (!) 102  Resp: 20 20  Temp:  (!) 102.8 F (39.3 C)  SpO2: 97% 97%   General: Awake, oriented x4. CV:  Good peripheral perfusion.  Resp:  Normal effort.  Abd:  No distention.  Other:  Elderly overweight Caucasian male resting comfortably in no acute distress ED Results / Procedures / Treatments  Labs (all labs ordered are listed, but only abnormal results are displayed) Labs Reviewed  CBC WITH DIFFERENTIAL/PLATELET - Abnormal; Notable for the following components:      Result Value   WBC 11.0 (*)    RBC 2.72 (*)    Hemoglobin 9.4 (*)    HCT 28.5 (*)  MCV 104.8 (*)    MCH 34.6 (*)    Platelets 110 (*)    Neutro Abs 9.9 (*)    Lymphs Abs 0.4 (*)    All other components within normal limits  COMPREHENSIVE METABOLIC PANEL - Abnormal; Notable for the following components:   Chloride 94 (*)    Glucose, Bld 117 (*)    BUN 41 (*)    Creatinine, Ser 6.33 (*)    AST 129 (*)    GFR, Estimated 8 (*)    Anion gap 16 (*)    All other components within normal limits  PROTIME-INR - Abnormal; Notable for the following components:   Prothrombin Time 16.5 (*)    INR 1.3 (*)    All other components within normal limits  TROPONIN I (HIGH SENSITIVITY) - Abnormal; Notable for the following components:   Troponin I (High Sensitivity) 136 (*)    All other components within normal limits  RESP PANEL BY RT-PCR (RSV, FLU A&B, COVID)  RVPGX2  CULTURE, BLOOD (ROUTINE X 2)  CULTURE, BLOOD  (ROUTINE X 2)  LACTIC ACID, PLASMA  APTT  URINALYSIS, ROUTINE W REFLEX MICROSCOPIC  TROPONIN I (HIGH SENSITIVITY)   EKG ED ECG REPORT I, Merwyn Katos, the attending physician, personally viewed and interpreted this ECG. Date: 11/21/2022 EKG Time: 1710 Rate: 96 Rhythm: normal sinus rhythm QRS Axis: normal Intervals: normal ST/T Wave abnormalities: normal Narrative Interpretation: no evidence of acute ischemia RADIOLOGY ED MD interpretation: One-view portable chest x-ray interpreted by me shows no evidence of acute abnormalities including no pneumonia, pneumothorax, or widened mediastinum -Agree with radiology assessment Official radiology report(s): DG Chest Port 1 View  Result Date: 11/21/2022 CLINICAL DATA:  Dizziness.  Fall. EXAM: PORTABLE CHEST 1 VIEW COMPARISON:  09/27/2022 FINDINGS: Remote bilateral rib fractures. The left-sided fractures are new since the prior plain film. Midline trachea. Mild cardiomegaly. Tortuous thoracic aorta. No pleural effusion or pneumothorax. Clear lungs. No congestive failure. IMPRESSION: 1. No acute cardiopulmonary disease. 2. Cardiomegaly without congestive failure. Electronically Signed   By: Jeronimo Greaves M.D.   On: 11/21/2022 18:44   PROCEDURES: Critical Care performed: Yes, see critical care procedure note(s) .1-3 Lead EKG Interpretation  Performed by: Merwyn Katos, MD Authorized by: Merwyn Katos, MD     Interpretation: normal     ECG rate:  91   ECG rate assessment: normal     Rhythm: sinus rhythm     Ectopy: none     Conduction: normal   CRITICAL CARE Performed by: Merwyn Katos  Total critical care time: 31 minutes  Critical care time was exclusive of separately billable procedures and treating other patients.  Critical care was necessary to treat or prevent imminent or life-threatening deterioration.  Critical care was time spent personally by me on the following activities: development of treatment plan with patient  and/or surrogate as well as nursing, discussions with consultants, evaluation of patient's response to treatment, examination of patient, obtaining history from patient or surrogate, ordering and performing treatments and interventions, ordering and review of laboratory studies, ordering and review of radiographic studies, pulse oximetry and re-evaluation of patient's condition.  MEDICATIONS ORDERED IN ED: Medications  lactated ringers infusion (has no administration in time range)  acetaminophen (TYLENOL) tablet 650 mg (650 mg Oral Given 11/21/22 1728)  sodium chloride 0.9 % bolus 1,000 mL (1,000 mLs Intravenous New Bag/Given 11/21/22 1803)  ceFEPIme (MAXIPIME) 2 g in sodium chloride 0.9 % 100 mL IVPB (0 g Intravenous Stopped  11/21/22 1834)  ibuprofen (ADVIL) tablet 800 mg (800 mg Oral Given 11/21/22 1915)   IMPRESSION / MDM / ASSESSMENT AND PLAN / ED COURSE  I reviewed the triage vital signs and the nursing notes.                             The patient is on the cardiac monitor to evaluate for evidence of arrhythmia and/or significant heart rate changes. Patient's presentation is most consistent with acute presentation with potential threat to life or bodily function. The Pt presents with fever, weakness, tachycardia highly concerning for sepsis (suspected urinary source). At this time, the Pt is satting well on room air, normotensive, and appears HDS.  Will start empiric antibiotics and fluids.  Due to mild hypotension on arrival, will administer fluids gradually with frequent reassessment. Have low suspicion for a GI, skin/soft tissue, or CNS source at this time, but will reconsider if initial workup is unremarkable.  - CBC, BMP, LFTs - VBG - UA - BCx x2, Lactate - EKG - CXR - Empiric Abx: Rocephin - Fluids: Patient in end-stage renal disease on dialysis Tuesday/Thursday/Saturday with dialysis session yesterday.  Patient shows no signs of septic shock at this time and therefore we will only  administer 1 L of normal saline  Dispo: Admit to medicine   FINAL CLINICAL IMPRESSION(S) / ED DIAGNOSES   Final diagnoses:  Fall, initial encounter  Sepsis, due to unspecified organism, unspecified whether acute organ dysfunction present [facility])   Rx / DC Orders   ED Discharge Orders     None      Note:  This document was prepared using Dragon voice recognition software and may include unintentional dictation errors.   Merwyn Katos, MD 11/21/22 [phone]

## 2022-11-21 NOTE — Consult Note (Signed)
CODE SEPSIS - PHARMACY COMMUNICATION  **Broad Spectrum Antibiotics should be administered within 1 hour of Sepsis diagnosis**  Time Code Sepsis Called/Page Received: 1749  Antibiotics Ordered: cefepime  Time of 1st antibiotic administration: 1804  Additional action taken by pharmacy: N/A  Celene Squibb, PharmD Clinical Pharmacist 11/21/2022 5:53 PM

## 2022-11-21 NOTE — Progress Notes (Signed)
Elink following for sepsis protocol. 

## 2022-11-21 NOTE — Assessment & Plan Note (Signed)
Resolved. Diet will be carb consistent once pt is awake and passes swallow as he is somnolent but oriented.

## 2022-11-21 NOTE — ED Triage Notes (Signed)
Patient brought in by EMS from home for evaluation after sustaining a fall when he was getting out of bed; EMS states that he became dizzy and fell and was on the floor for approx. 3-4 hours; Upon arrival to ED, patient is A&O x 4 and denies loss of consciousness; He does have a small abrasion to his forehead but denies significant head injury (is not on blood thinners); Denies cardiac/pulmonary history; Recently had cystoscopy performed for reoccuring UTIs and hematuria; Low grade temp. Of 100.7

## 2022-11-21 NOTE — H&P (Incomplete)
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 be reasonable and necessary in order to provide the required intensity of service to ensure the patient's safety. The patient's presenting symptoms,  physical exam findings, and initial radiographic and laboratory data in the context of their chronic comorbidities is felt to place them at high risk for further clinical deterioration. Furthermore, it is not anticipated that the patient will be medically stable for discharge from the hospital within 2 midnights of admission.   * I certify that at the point of admission it is my clinical judgment that the patient will require inpatient hospital care spanning beyond 2 midnights from the point of admission due to high intensity of service, high risk for further deterioration and high frequency of surveillance required.*  Author: Gertha Calkin, MD 11/21/2022 11:56 PM  For on call review [email or web address].

## 2022-11-21 NOTE — Assessment & Plan Note (Signed)
Pt coming with fall from home. Suspect this may be related to developing infection and weakness. Pt lives alone and is otherwise stable. Ct imaging donew shows perinephric stranding. And we will continue with fall precaution. Cont with IV abx.

## 2022-11-21 NOTE — Assessment & Plan Note (Signed)
Urinalysis / culture.  Iv abx.,

## 2022-11-21 NOTE — Assessment & Plan Note (Signed)
Gentle IV fluid hydration.  Check CPK tomorrow.  Hold Crestor.  This is the cause of the elevated troponin.

## 2022-11-21 NOTE — H&P (Incomplete)
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 documented.  Have requested ICU APP to review pt he has not had but 250 cc output.  And may need pressor support if midodrine does not work.  Chart shows pt's bp was 85/41 at  office visit on 8/30/ 24  with tachycardia  of 102.   Rhabdomyolysis 2/2 to fall ( 2/2 to hypotension )and immobility on floor for four hour or so. Cont with LR.    Dysuria Urinalysis / culture.  Iv abx.,  Type 2 diabetes mellitus with other diabetic kidney complication (HCC) Resolved. Accucheck / glycemic protocol.  Diet will be carb consistent once pt is awake and passes swallow as he is somnolent but oriented.   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  Pt still makes urine so avoid contrast. And renally dose medication.     Hypertension Vitals:   11/21/22 1659 11/21/22 1700 11/21/22 1900 11/21/22 1902  BP: 128/70 122/64 (!) 153/83 (!) 153/83   11/21/22 2016 11/21/22 2030 11/21/22 2100 11/21/22 2200  BP: 124/60 (!) 133/119 125/64 (!) 92/54   11/21/22 2200 11/21/22 2211 11/21/22 2215 11/21/22 2218  BP: (!) 92/54 (!) 157/145 (!) 80/46 (!) 86/49  Home meds are Lotensin and hydralazine - held.  Started midodrine and 2 d echo to assess EF.    Anemia Suspect anemia of CKD as pt is anemic since 2016 in our chart records.  Type/ screen/ IV PPI. MCV elevated we will get B12 level and folate.  Hematology consult as needed on for additional eval for macrocytosis / EP eval.   Elevated troponin Suspect due to increased demand. Echo ordered and pending.  We will follow cardiology consult as needed.  EKG sr at 96 and qrs 122 and qtc 464 no st t wave changes.    Gross hematuria Type/ screen.  PSA. Transfuse if h/h is 8 and below.  Urology consult.     Sepsis secondary to UTI (HCC) Suspect 2/2 to UTI. Continue with cefepime per pharmacy consult.  Pt on MIVF lr at 100.     DVT prophylaxis:  Eliquis  Consults:  Nephrology   Advance Care Planning:    Code Status: Full Code   Family Communication:  None   Disposition Plan:  Home   Severity of Illness: The appropriate patient status for this patient is INPATIENT. Inpatient status is judged to be reasonable and necessary in order to provide the required intensity of service to ensure the patient's safety. The patient's presenting symptoms, physical exam findings, and initial radiographic and laboratory data in the context of their chronic comorbidities is felt to place them at high risk for further clinical deterioration. Furthermore, it is not anticipated that the patient will be medically stable for discharge from the hospital within 2 midnights of admission.   * I certify that at the point of admission it is my clinical judgment that the  patient will require inpatient hospital care spanning beyond 2 midnights from the point of admission due to high intensity of service, high risk for further deterioration and high frequency of surveillance required.*  Author: Gertha Calkin, MD 11/22/2022 2:21 AM  For on call review [email or web address].

## 2022-11-21 NOTE — ED Notes (Signed)
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

## 2022-11-21 NOTE — Assessment & Plan Note (Addendum)
Nephrology consult.

## 2022-11-22 ENCOUNTER — Encounter: Payer: Self-pay | Admitting: Internal Medicine

## 2022-11-22 ENCOUNTER — Inpatient Hospital Stay
Admit: 2022-11-22 | Discharge: 2022-11-22 | Disposition: A | Discharge status: Home / Self Care | Payer: Medicare Other | Attending: Internal Medicine | Admitting: Internal Medicine

## 2022-11-22 DIAGNOSIS — I9589 Other hypotension: Secondary | ICD-10-CM

## 2022-11-22 DIAGNOSIS — G4733 Obstructive sleep apnea (adult) (pediatric): Secondary | ICD-10-CM

## 2022-11-22 DIAGNOSIS — R55 Syncope and collapse: Secondary | ICD-10-CM | POA: Diagnosis not present

## 2022-11-22 DIAGNOSIS — G9341 Metabolic encephalopathy: Secondary | ICD-10-CM

## 2022-11-22 DIAGNOSIS — E162 Hypoglycemia, unspecified: Secondary | ICD-10-CM

## 2022-11-22 DIAGNOSIS — D649 Anemia, unspecified: Secondary | ICD-10-CM

## 2022-11-22 DIAGNOSIS — R6520 Severe sepsis without septic shock: Secondary | ICD-10-CM

## 2022-11-22 DIAGNOSIS — I959 Hypotension, unspecified: Secondary | ICD-10-CM | POA: Diagnosis present

## 2022-11-22 DIAGNOSIS — N186 End stage renal disease: Secondary | ICD-10-CM

## 2022-11-22 DIAGNOSIS — A419 Sepsis, unspecified organism: Secondary | ICD-10-CM | POA: Diagnosis not present

## 2022-11-22 DIAGNOSIS — Z992 Dependence on renal dialysis: Secondary | ICD-10-CM

## 2022-11-22 DIAGNOSIS — T796XXS Traumatic ischemia of muscle, sequela: Secondary | ICD-10-CM

## 2022-11-22 DIAGNOSIS — R310 Gross hematuria: Secondary | ICD-10-CM

## 2022-11-22 LAB — BLOOD GAS, VENOUS
Acid-Base Excess: 5.8 mmol/L — ABNORMAL HIGH (ref 0.0–2.0)
Bicarbonate: 30.6 mmol/L — ABNORMAL HIGH (ref 20.0–28.0)
O2 Saturation: 72.2 %
Patient temperature: 37
pCO2, Ven: 44 mmHg (ref 44–60)
pH, Ven: 7.45 — ABNORMAL HIGH (ref 7.25–7.43)
pO2, Ven: 43 mmHg (ref 32–45)

## 2022-11-22 LAB — ECHOCARDIOGRAM COMPLETE
AR max vel: 3.87 cm2
AV Area VTI: 3.76 cm2
AV Area mean vel: 3.66 cm2
AV Mean grad: 6 mmHg
AV Peak grad: 10.2 mmHg
Ao pk vel: 1.6 m/s
Area-P 1/2: 3.68 cm2
Height: 66 in
S' Lateral: 3.1 cm
Weight: 2832.01 [oz_av]

## 2022-11-22 LAB — BLOOD CULTURE ID PANEL (REFLEXED) - BCID2

## 2022-11-22 LAB — GLUCOSE, CAPILLARY
Glucose-Capillary: 75 mg/dL (ref 70–99)
Glucose-Capillary: 74 mg/dL (ref 70–99)
Glucose-Capillary: 68 mg/dL — ABNORMAL LOW (ref 70–99)
Glucose-Capillary: 101 mg/dL — ABNORMAL HIGH (ref 70–99)
Glucose-Capillary: 113 mg/dL — ABNORMAL HIGH (ref 70–99)
Glucose-Capillary: 110 mg/dL — ABNORMAL HIGH (ref 70–99)
Glucose-Capillary: 176 mg/dL — ABNORMAL HIGH (ref 70–99)

## 2022-11-22 LAB — FOLATE: Folate: >40 ng/mL (ref >5.9)

## 2022-11-22 LAB — FERRITIN: Ferritin: 636 ng/mL — ABNORMAL HIGH (ref 24–336)

## 2022-11-22 LAB — CORTISOL-AM, BLOOD: Cortisol - AM: 14.6 ug/dL (ref 6.7–22.6)

## 2022-11-22 LAB — TROPONIN I (HIGH SENSITIVITY): Troponin I (High Sensitivity): 164 ng/L (ref <18)

## 2022-11-22 LAB — OCCULT BLOOD X 1 CARD TO LAB, STOOL: Fecal Occult Bld: NEGATIVE

## 2022-11-22 LAB — AMMONIA: Ammonia: 18 umol/L (ref 9–35)

## 2022-11-22 LAB — HEMOGLOBIN A1C
Hgb A1c MFr Bld: 5 % (ref 4.8–5.6)
Mean Plasma Glucose: 96.8 mg/dL

## 2022-11-22 LAB — GAMMA GT: GGT: 20 U/L (ref 7–50)

## 2022-11-22 LAB — HEPATITIS B SURFACE ANTIGEN: Hepatitis B Surface Ag: NONREACTIVE

## 2022-11-22 LAB — VITAMIN B12: Vitamin B-12: 431 pg/mL (ref 180–914)

## 2022-11-22 LAB — TSH: TSH: 0.157 u[IU]/mL — ABNORMAL LOW (ref 0.350–4.500)

## 2022-11-22 LAB — BRAIN NATRIURETIC PEPTIDE: B Natriuretic Peptide: 167.4 pg/mL — ABNORMAL HIGH (ref 0.0–100.0)

## 2022-11-22 LAB — LACTIC ACID, PLASMA: Lactic Acid, Venous: 1.1 mmol/L (ref 0.5–1.9)

## 2022-11-22 LAB — T4, FREE: Free T4: 1.05 ng/dL (ref 0.61–1.12)

## 2022-11-22 LAB — PROCALCITONIN: Procalcitonin: 2.99 ng/mL

## 2022-11-22 LAB — MRSA NEXT GEN BY PCR, NASAL: MRSA by PCR Next Gen: NOT DETECTED

## 2022-11-22 LAB — ETHANOL: Alcohol, Ethyl (B): <10 mg/dL (ref <10)

## 2022-11-22 MED ORDER — MIDODRINE HCL 5 MG PO TABS
5.0000 mg | ORAL_TABLET | Freq: Three times a day (TID) | ORAL | Status: DC
  Administered 2022-11-22 – 2022-11-23 (×4): 5 mg via ORAL
  Filled 2022-11-22 (×5): qty 1

## 2022-11-22 MED ORDER — INSULIN ASPART 100 UNIT/ML IJ SOLN: 0.0000 [IU] | INTRAMUSCULAR | Status: DC | PRN

## 2022-11-22 MED ORDER — DEXTROSE 50 % IV SOLN
25.0000 g | Freq: Once | INTRAVENOUS | Status: AC
  Administered 2022-11-22: 25 g via INTRAVENOUS
  Filled 2022-11-22: qty 50

## 2022-11-22 MED ORDER — SODIUM CHLORIDE 0.9 % IV SOLN
2.0000 g | INTRAVENOUS | Status: DC
  Administered 2022-11-22 – 2022-11-25 (×4): 2 g via INTRAVENOUS
  Filled 2022-11-22 (×4): qty 20

## 2022-11-22 MED ORDER — DEXTROSE-SODIUM CHLORIDE 5-0.9 % IV SOLN: INTRAVENOUS | Status: DC

## 2022-11-22 MED ORDER — CHLORHEXIDINE GLUCONATE CLOTH 2 % EX PADS
6.0000 | MEDICATED_PAD | Freq: Every day | CUTANEOUS | Status: DC
  Administered 2022-11-22 – 2022-11-26 (×3): 6 via TOPICAL

## 2022-11-22 MED ORDER — LACTATED RINGERS IV BOLUS
500.0000 mL | Freq: Once | INTRAVENOUS | Status: AC
  Administered 2022-11-22: 500 mL via INTRAVENOUS

## 2022-11-22 MED ORDER — SODIUM CHLORIDE 0.9 % IV SOLN: INTRAVENOUS | Status: DC

## 2022-11-22 MED ORDER — SODIUM CHLORIDE 0.9 % IV BOLUS
500.0000 mL | Freq: Once | INTRAVENOUS | Status: AC
  Administered 2022-11-22: 500 mL via INTRAVENOUS

## 2022-11-22 NOTE — Consult Note (Signed)
PHARMACY - PHYSICIAN COMMUNICATION CRITICAL VALUE ALERT - BLOOD CULTURE IDENTIFICATION (BCID)  Luis Miller. is an 77 y.o. male who presented to [facility] on 11/21/2022 with a chief complaint of dizziness and a fall. Recently had cystoscopy performed for reoccuring UTIs and hematuria.   Assessment: 4/4 E.coli with no resistance (urine suspected source)  Name of physician (or Provider) Contacted: Alford Highland   Current antibiotics: Cefepime 1 gm daily  Changes to prescribed antibiotics recommended: Ceftriaxone 2 gm daily  Results for orders placed or performed during the hospital encounter of 11/21/22  Blood Culture ID Panel (Reflexed) (Collected: 11/21/2022  5:11 PM)  Result Value Ref Range   Enterococcus faecalis NOT DETECTED NOT DETECTED   Enterococcus Faecium NOT DETECTED NOT DETECTED   Listeria monocytogenes NOT DETECTED NOT DETECTED   Staphylococcus species NOT DETECTED NOT DETECTED   Staphylococcus aureus (BCID) NOT DETECTED NOT DETECTED   Staphylococcus epidermidis NOT DETECTED NOT DETECTED   Staphylococcus lugdunensis NOT DETECTED NOT DETECTED   Streptococcus species NOT DETECTED NOT DETECTED   Streptococcus agalactiae NOT DETECTED NOT DETECTED   Streptococcus pneumoniae NOT DETECTED NOT DETECTED   Streptococcus pyogenes NOT DETECTED NOT DETECTED   A.calcoaceticus-baumannii NOT DETECTED NOT DETECTED   Bacteroides fragilis NOT DETECTED NOT DETECTED   Enterobacterales DETECTED (A) NOT DETECTED   Enterobacter cloacae complex NOT DETECTED NOT DETECTED   Escherichia coli DETECTED (A) NOT DETECTED   Klebsiella aerogenes NOT DETECTED NOT DETECTED   Klebsiella oxytoca NOT DETECTED NOT DETECTED   Klebsiella pneumoniae NOT DETECTED NOT DETECTED   Proteus species NOT DETECTED NOT DETECTED   Salmonella species NOT DETECTED NOT DETECTED   Serratia marcescens NOT DETECTED NOT DETECTED   Haemophilus influenzae NOT DETECTED NOT DETECTED   Neisseria meningitidis NOT DETECTED NOT  DETECTED   Pseudomonas aeruginosa NOT DETECTED NOT DETECTED   Stenotrophomonas maltophilia NOT DETECTED NOT DETECTED   Candida albicans NOT DETECTED NOT DETECTED   Candida auris NOT DETECTED NOT DETECTED   Candida glabrata NOT DETECTED NOT DETECTED   Candida krusei NOT DETECTED NOT DETECTED   Candida parapsilosis NOT DETECTED NOT DETECTED   Candida tropicalis NOT DETECTED NOT DETECTED   Cryptococcus neoformans/gattii NOT DETECTED NOT DETECTED   CTX-M ESBL NOT DETECTED NOT DETECTED   Carbapenem resistance IMP NOT DETECTED NOT DETECTED   Carbapenem resistance KPC NOT DETECTED NOT DETECTED   Carbapenem resistance NDM NOT DETECTED NOT DETECTED   Carbapenem resist OXA 48 LIKE NOT DETECTED NOT DETECTED   Carbapenem resistance VIM NOT DETECTED NOT DETECTED   Littie Deeds, PharmD PGY1 Pharmacy Resident 11/22/2022  7:23 AM

## 2022-11-22 NOTE — Assessment & Plan Note (Signed)
Patient more alert today.

## 2022-11-22 NOTE — Progress Notes (Signed)
*  PRELIMINARY RESULTS* Echocardiogram 2D Echocardiogram has been performed.  Earlie Server Manal Kreutzer 11/22/2022, 1:12 PM

## 2022-11-22 NOTE — Assessment & Plan Note (Signed)
On CPAP at night

## 2022-11-22 NOTE — TOC Initial Note (Signed)
Transition of Care (TOC) - Initial/Assessment Note    Patient Details  Name: Luis Miller. MRN: [id number] Date of Birth: [birth year]  Transition of Care [facility]) CM/SW Contact:    Darolyn Rua, LCSW Phone Number: 11/22/2022, 9:59 AM  Clinical Narrative:                  Patient presented to ED from home with fall, patient has hx of geropsych at [facility].  PCP: Larae Grooms, NP  Insurance: Medicare/Medicare part A and B HH: History of [facility] DME: has walker at home  Pending PT/OT evals for appropriate dispo planning. TOC will continue to follow.    Expected Discharge Plan:  (TBD) Barriers to Discharge: Continued Medical Work up   Patient Goals and CMS Choice Patient states their goals for this hospitalization and ongoing recovery are:: to go home CMS Medicare.gov Compare Post Acute Care list provided to:: Patient Choice offered to / list presented to : Patient      Expected Discharge Plan and Services       Living arrangements for the past 2 months: Single Family Home                                      Prior Living Arrangements/Services Living arrangements for the past 2 months: Single Family Home Lives with:: Self                   Activities of Daily Living Home Assistive Devices/Equipment: None ADL Screening (condition at time of admission) Patient's cognitive ability adequate to safely complete daily activities?: Yes Is the patient deaf or have difficulty hearing?: No Does the patient have difficulty seeing, even when wearing glasses/contacts?: Yes (on left eye) Does the patient have difficulty concentrating, remembering, or making decisions?: No Patient able to express need for assistance with ADLs?: Yes Does the patient have difficulty dressing or bathing?: No Independently performs ADLs?: Yes (appropriate for developmental age) Does the patient have difficulty walking or climbing stairs?: No Weakness of Legs: None Weakness of  Arms/Hands: None  Permission Sought/Granted                  Emotional Assessment              Admission diagnosis:  Fall, initial encounter [W19.XXXA] Fall at home, initial encounter [phone], Y92.009] Sepsis, due to unspecified organism, unspecified whether acute organ dysfunction present [facility]) [A41.9] Patient Active Problem List   Diagnosis Date Noted   Hypotension 11/22/2022   Anemia 11/22/2022   Gross hematuria 11/22/2022   Elevated troponin 11/22/2022   Dysuria 11/21/2022   Fall at home, initial encounter 11/21/2022   Rhabdomyolysis 11/21/2022   Somnolence 11/21/2022   Severe bipolar I disorder, current or most recent episode depressed (HCC) 10/02/2022   Sepsis secondary to UTI (HCC) 09/26/2022   SIRS (systemic inflammatory response syndrome) (HCC) 09/26/2022   Malfunction of arteriovenous graft (HCC) 09/20/2022   Bipolar I disorder, most recent episode depressed (HCC) 07/30/2022   Bipolar depression (HCC) 07/30/2022   Bipolar I disorder, mild, current or most recent episode depressed, with anxious distress (HCC) 07/26/2022   Anxiety disorder 01/06/2022   ESRD on hemodialysis (HCC) 08/24/2021   Sleep apnea 08/24/2021   Type 2 diabetes mellitus with other diabetic kidney complication (HCC) 05/25/2021   Abnormal TSH 10/01/2020   Memory loss 05/19/2020   Hyperlipidemia 05/19/2020   At risk  for prolonged QT interval syndrome 04/24/2020   Insomnia due to medical condition 04/24/2020   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   Hypertension associated with diabetes (HCC) 02/01/2019   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   Hemorrhoids, internal 12/12/2017   Hx of adenomatous colonic polyps 12/05/2017   Thrombosed external hemorrhoid 12/05/2017   Drug-induced tremor 05/08/2017   Secondary hyperparathyroidism of renal origin (HCC) 04/05/2017   Advanced care planning/counseling discussion 04/05/2017   Carotid bruit 04/05/2017   Gout 02/26/2016   Erectile  dysfunction due to arterial insufficiency 07/08/2015   Obesity 11/27/2014   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 [place] [address] Phone: [phone] Fax: [phone]  [facility] - [place], [place] - [address] [place] [address] Phone: [phone] Fax: [phone]     Social Determinants of Health (SDOH) Social History: SDOH Screenings   Food Insecurity: No Food Insecurity (11/21/2022)  Housing: Patient Declined (11/21/2022)  Transportation Needs: Patient Declined (11/21/2022)  Utilities: Not At Risk (11/21/2022)  Alcohol Screen: Low Risk  (09/30/2022)  Depression (PHQ2-9): High Risk (11/05/2022)  Financial Resource Strain: Low Risk  (12/07/2021)  Physical Activity: Insufficiently Active (12/07/2021)  Social Connections: Moderately Integrated (12/07/2021)  Stress: No Stress Concern Present (12/07/2021)  Tobacco Use: Medium Risk (11/19/2022)   SDOH Interventions:     Readmission Risk Interventions     No data to display

## 2022-11-22 NOTE — Assessment & Plan Note (Addendum)
Had recent cystoscopy which did not show any etiology for hematuria.  Likely BPH.

## 2022-11-22 NOTE — Progress Notes (Addendum)
SLP Cancellation Note  Patient Details Name: Luis Miller. MRN: [id number] DOB: Jul 20, 1945   Cancelled treatment:       Reason Eval/Treat Not Completed: Fatigue/lethargy limiting ability to participate (chart reviewed; consulted NSG in room w/ pt re: his status today)  Per NSG report and chart notes, pt has been somnolent and more lethargic this morning. NSG is concerned re: his swallowing and risk for aspiration/choking w/ po's d/t unable to stay awake during po's this morning at breakfast meal. Noted similar lethargy upon being in room w/ pt/NSG -- pt was able to awaken briefly w/ MOD+ verbal/tactile time.   Recommend pt maintain an NPO status until less lethargic and able to maintain consistent alertness w/out constant verbal/tactile stim by Staff, for safe oral intake at meals. NSG to monitor pt's alert State for Pills in Puree, if any oral medication is ordered but Hold if pt cannot remain awake. ST services will f/u w/ pt in the morning post Time given for improved medical status and alertness. NSG agreed. Rapid Response NSG present and agreed as well. Noted Nephrology following. Recommend frequent oral care for hygiene and stimulation of swallowing.      Jerilynn Som, MS, CCC-SLP Speech Language Pathologist Rehab Services; [facility] [phone] (ascom) [place] 11/22/2022, 12:16 PM

## 2022-11-22 NOTE — Assessment & Plan Note (Signed)
Yesterday.  Given an amp of D50 and D10 in the fluids.  Now since he is on a diet we will get rid of D10.

## 2022-11-22 NOTE — Hospital Course (Signed)
77 year old man with history of end-stage renal disease on hemodialysis, anemia, bipolar disorder, sleep apnea, gout, hypertension coming in after a fall.  Patient found to have a fever.  Patient had a recent cystoscopy for hematuria. Positive for E. coli in blood cultures.  CT scan concerning for right pyelonephritis, hydronephrosis.  Antibiotics switched over to Rocephin.    Transfer out of ICU on 9/3.

## 2022-11-22 NOTE — Progress Notes (Signed)
Initial Nutrition Assessment  DOCUMENTATION CODES:   Not applicable  INTERVENTION:   -RD will follow for diet advancement and add supplements as appropriate  NUTRITION DIAGNOSIS:   Inadequate oral intake related to inability to eat, dysphagia as evidenced by NPO status.  GOAL:   Patient will meet greater than or equal to 90% of their needs  MONITOR:   Diet advancement  REASON FOR ASSESSMENT:   Malnutrition Screening Tool    ASSESSMENT:   Pt with medical history significant for end-stage renal disease on hemodialysis, anemia, bipolar disorder, sleep apnea, gout, hypertension coming for a fall at home.  Pt admitted with fall at home and somnolence and sepsis secondary to pyelonephritis.   Reviewed I/O's: +2.2 L x 24 hours  UOP: 275 ml x 24 hours   Pt lying in bed at time of visit. Pt did not interact much with RD other than to state hands were cold during exam.   Case discussed with SLP. Pt too lethargic to take PO's at this time. Pt unable to stay awake to safely take pills or eat breakfast. Rapid response called and pt transferred to ICU.   Reviewed wt hx; pt has experienced a 6.9% wt loss over the past 6 months, which is not significant for time frame. Unsure of EDW. Pt received full HD on 11/20/22 per nephrology notes.   Medications reviewed and include vitamin D3 and dextrose 5%-0.0% sodium chloride infusion @ 60 ml/hr.   Labs reviewed: CBGS: 68-101.    NUTRITION - FOCUSED PHYSICAL EXAM:  Flowsheet Row Most Recent Value  Orbital Region No depletion  Upper Arm Region No depletion  Thoracic and Lumbar Region No depletion  Buccal Region No depletion  Temple Region No depletion  Clavicle Bone Region No depletion  Clavicle and Acromion Bone Region No depletion  Scapular Bone Region No depletion  Dorsal Hand No depletion  Patellar Region Mild depletion  Anterior Thigh Region Mild depletion  Posterior Calf Region Mild depletion  Edema (RD Assessment) None   Hair Reviewed  Eyes Reviewed  Mouth Reviewed  Skin Reviewed  Nails Reviewed       Diet Order:   Diet Order             Diet NPO time specified Except for: Other (See Comments), Sips with Meds  Diet effective now                   EDUCATION NEEDS:   No education needs have been identified at this time  Skin:  Skin Assessment: Reviewed RN Assessment  Last BM:  Unknown  Height:   Ht Readings from Last 1 Encounters:  11/21/22 5\' 6"  (1.676 m)    Weight:   Wt Readings from Last 1 Encounters:  11/22/22 78.1 kg    Ideal Body Weight:  64.5 kg  BMI:  Body mass index is 27.79 kg/m.  Estimated Nutritional Needs:   Kcal:  1950-2150  Protein:  100-115 grams  Fluid:  1000 ml +UOP    Levada Schilling, RD, LDN, CDCES Registered Dietitian II Certified Diabetes Care and Education Specialist Please refer to [facility] for RD and/or RD on-call/weekend/after hours pager

## 2022-11-22 NOTE — Assessment & Plan Note (Addendum)
Taper midodrine down to 2.5 mg 3 times daily may be able to come off midodrine.  Discontinue fluids.

## 2022-11-22 NOTE — Plan of Care (Signed)
  Problem: Respiratory: Goal: Ability to maintain adequate ventilation will improve Outcome: Progressing   Problem: Clinical Measurements: Goal: Respiratory complications will improve Outcome: Progressing Goal: Cardiovascular complication will be avoided Outcome: Progressing   Problem: Pain Managment: Goal: General experience of comfort will improve Outcome: Progressing   Problem: Fluid Volume: Goal: Hemodynamic stability will improve Outcome: Not Progressing   Problem: Clinical Measurements: Goal: Signs and symptoms of infection will decrease Outcome: Not Progressing   Problem: Clinical Measurements: Goal: Ability to maintain clinical measurements within normal limits will improve Outcome: Not Progressing Goal: Will remain free from infection Outcome: Not Progressing   Problem: Activity: Goal: Risk for activity intolerance will decrease Outcome: Not Progressing

## 2022-11-22 NOTE — Assessment & Plan Note (Addendum)
Present on admission, with pyelonephritis with E. coli growing in blood cultures, fever, hypotension, leukocytosis, a acute metabolic encephalopathy.  Antibiotics switched to Rocephin.  Patient had recent cystoscopy.

## 2022-11-22 NOTE — Assessment & Plan Note (Deleted)
Check a ferritin.  Suspect anemia of chronic disease.  Last hemoglobin 9.4.

## 2022-11-22 NOTE — Progress Notes (Signed)
PT Cancellation Note  Patient Details Name: Luis Miller. MRN: [id number] DOB: Dec 18, 1945   Cancelled Treatment:    Reason Eval/Treat Not Completed: Medical issues which prohibited therapy (Consult received and chart reviewed.  Patient recently involved with rapid response and subsequent transfer to CCU.  Will hold therapy services at this time.  Will continue to follow and re-attempt as medically appropriate.)   [name], PT, DPT, NCS 11/22/22, 1:16 PM [phone]

## 2022-11-22 NOTE — Consult Note (Signed)
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 authorized for detection and/or diagnosis of SARS-CoV-2 by FDA under an Emergency Use Authorization (EUA). This EUA will remain  in effect (meaning this test can be used) for the duration of the COVID-19  declaration under Section 564(b)(1) of the Act, 21 U.S.C.section 360bbb-3(b)(1), unless the authorization is terminated  or revoked sooner.       Influenza A by PCR NEGATIVE NEGATIVE Final   Influenza B by PCR NEGATIVE NEGATIVE Final    Comment: (NOTE) The  Xpert Xpress SARS-CoV-2/FLU/RSV plus assay is intended as an aid in the diagnosis of influenza from Nasopharyngeal swab specimens and should not be used as a sole basis for treatment. Nasal washings and aspirates are unacceptable for Xpert Xpress SARS-CoV-2/FLU/RSV testing.  Fact Sheet for Patients: BloggerCourse.com  Fact Sheet for Healthcare Providers: SeriousBroker.it  This test is not yet approved or cleared by the [place] FDA and has been authorized for detection and/or diagnosis of SARS-CoV-2 by FDA under an Emergency Use Authorization (EUA). This EUA will remain in effect (meaning this test can be used) for the duration of the COVID-19 declaration under Section 564(b)(1) of the Act, 21 U.S.C. section 360bbb-3(b)(1), unless the authorization is terminated or revoked.     Resp Syncytial Virus by PCR NEGATIVE NEGATIVE Final    Comment: (NOTE) Fact Sheet for Patients: BloggerCourse.com  Fact Sheet for Healthcare Providers: SeriousBroker.it  This test is not yet approved or cleared by the [place] FDA and has been authorized for detection and/or diagnosis of SARS-CoV-2 by FDA under an Emergency Use Authorization (EUA). This EUA will remain in effect (meaning this test can be used) for the duration of the COVID-19 declaration under Section 564(b)(1) of the Act, 21 U.S.C. section 360bbb-3(b)(1), unless the authorization is terminated or revoked.  Performed at [facility], [address] [place], [place] [address]    Creatinine: Recent Labs    11/21/22 1710  CREATININE 6.33*    Impression/Assessment:  Gross hematuria secondary to BPH Sepsis secondary to UTI  Plan:  Agree with maintaining Foley catheter for now.  Urine seems to be clearing up.  Can consider addition of finasteride but I do not think we need to add it right now.  Continue treatment  of UTI with antibiotics, fluid resuscitation and supportive ICU care as you are doing.  Ray Church, III 11/22/2022, 1:57 PM

## 2022-11-22 NOTE — Progress Notes (Signed)
Patient very lethargic this shift. MD Wieting states r/t positive blood cultures. Patient is unable to stay awake for conversation or to eat. Speech at bedside this shift. Rapid nurse also saw patient. MD Thedore Mins also visited with patient.   Patient was able to take meds and an apple sauce this morning. Vital signs are stable at this time. Patient is aware of who he is, where he is and what day it is. He did however state "I am so weak, I feel confused."  Foley is patent and draining pink urine. LUE fistula with +bruit and +thrill.  Patient is unable to recall last BM.

## 2022-11-22 NOTE — Progress Notes (Addendum)
    Patient Name: Luis Miller.   MRN: [id number]   Date of Birth/ Sex: 1945/08/27 , male      Admission Date: 11/21/2022  Attending Provider: Gertha Calkin, MD  Primary Diagnosis: Fall at home, initial encounter     I personally evaluated this patient at the bedside per the attending's provider's request for sepsis related hypotension.   Briefly, Luis Balsam. is a 77 y.o. male with significant PMH of Bipolar mood disorder, ESRD on HD, DM, HTN, HLD, Gout, Left Retinal stroke, IBS, DI, and recurrent UTI's s/p recent cystoscope 11/19/22 who presented to the ED post fall.  ED Course: Initial vital signs showed HR of 103 beats/minute, BP mm Hg, the RR 20 breaths/minute, and the oxygen saturation 100% on RA and a temperature of 103F Pertinent Labs/Diagnostics Findings: Glucose:117 BUN/Cr.:41/6.33, AST/ALT:129/32 WBC:11 Hgb/Hct: 9.4/28.5 Plts: 110 PCT: negative <0.10  Lactic acid: 1.1 COVID PCR: Negative,  troponin:136 ~147~176~164 BNP: 167 CK:6869 CXR>negative  CTH> negative CTA Chest, Abd/pelvis>No acute cardiopulmonary disease. Bibasilar atelectasis or scarring. Right perinephric stranding, new since prior study. No hydronephrosis or evidence of stones. Recommend clinical correlation to completely exclude infection/pyelonephritis. Cholelithiasis.Moderate stool burden with scattered colonic diverticula.  #Sepsis Secondary to Pyelonephritis Hx of recurrent UTIs s/p Cystoscopy 8/31 Initial interventions/workup included: 2.5 L of NS & Ceftriaxone meets SIRS criteria: Heart Rate 103 beats/minute, Respiratory Rate 20 breaths/minute,Temperature 102.0,  -Supplemental oxygen as needed, to maintain SpO2 > 90% -Monitor fever curve -Trend WBC's & Procalcitonin -Follow cultures as above -Continue empiric Ceftriaxone pending cultures & sensitivities -No indication for pressors at this time, -Urology consult if appropriate, was seen by Dr. Lonna Cobb on 8/31 for cystoscopy  #Thrombocytopenia   -Monitor for S/Sx of bleeding -Trend CBC -SCD's for VTE Prophylaxis (chemical ppx contraindicated due to hx of hematuria)   #ESRD on HD #Acute Rhabdomyolysis in the setting of prolonged immobilization, found down >4 hours -Trend CK -IVFs pending HD -Monitor I&O's / urinary output -Follow BMP -Ensure adequate renal perfusion -Avoid nephrotoxic agents as able -Replace electrolytes as indicated -Nephro consulted for HD    #Elevated troponin~Likely demand in the setting of above #HTN #HLD -Peaked at 176 -Trend troponin -Hold BP meds benazepril and hydralazine for now -Continue rosuvastatin once able to take p.o.  #Hx of Bipolar Disorder -Lithium level 0.36 -Resume home meds once able to take po, He is on Wellbutrin, doxepin, lamotrigine, lithium, Zyprexa and trazodone   Currently no critical care needs, patient is hemodynamically stable he awakes easily to voice and able to answers questions appropriately is alert and oriented x 4 and maintaining sat of 100% on room air.  Please reconsult with any critical care needs.   Webb Silversmith DNP, CCRN, FNP-C, AGACNP-BC Acute Care & Family Nurse Practitioner [facility] P[facility]M on call pager [phone]         .eo

## 2022-11-22 NOTE — Progress Notes (Signed)
Patients BG of 86. MD placed orders for d50% as well as maintenance fluids with dextrose. Patient with complaint of "prickly fingers." Fingers notably cyanotic. MD Wieting and Rapid nurse Sarah at bedside. Recheck BG 113  Order to transfer to stepdown.

## 2022-11-22 NOTE — Progress Notes (Signed)
[facility] [place], [place] 11/22/22  Subjective:   Hospital day # 1  Patient is a 77 year old male with end-stage renal disease on hemodialysis, hypertension, gout, depression, history of melanoma, Crohn's disease, history of stroke, bipolar disorder.  He was recently hospitalized at [facility] behavioral health for severe bipolar 1 disorder with recurrent depression.  Discharged on 10/05/2022.  Patient had a cystoscopy on 11/19/2022.  No bladder mucosal lesions were seen.  Etiology of hematuria was thought to be most likely BPH. He then presented to the emergency room on 11/22/2022 via EMS after sustaining a fall while getting out of bed.  Per notes, he stated he became dizzy and fell and was on the floor for approximately 3 to 4 hours.  Subsequently he was noted to have a fever of 102.8.  His urinalysis showed large hemoglobin, greater than 300 protein, greater than 50 WBCs and greater than 50 RBCs. He also endorses generalized weakness.  Blood pressure has been low off and on.  This morning blood pressure is 101/60 but overnight it was low at 80/46. Patient reports that he received his full dialysis on Saturday.   Objective:  Vital signs in last 24 hours:  Temp:  [97.6 F (36.4 C)-102.8 F (39.3 C)] 98.2 F (36.8 C) (09/02 1005) Pulse Rate:  [75-103] 81 (09/02 1005) Resp:  [16-22] 22 (09/02 1005) BP: (80-157)/(46-145) 101/60 (09/02 1005) SpO2:  [92 %-100 %] 95 % (09/02 1005) Weight:  [80.3 kg] 80.3 kg (09/01 1700)  Weight change:  Filed Weights   11/21/22 1700  Weight: 80.3 kg    Intake/Output:    Intake/Output Summary (Last 24 hours) at 11/22/2022 1032 Last data filed at 11/22/2022 4098 Gross per 24 hour  Intake 2469.86 ml  Output 275 ml  Net 2194.86 ml     Physical Exam: General: Chronically ill-appearing, laying in the bed  HEENT Moist oral mucous membranes  Pulm/lungs Normal breathing effort, clear to auscultation  CVS/Heart Regular, no rub  Abdomen:   Soft, nontender, nondistended  Extremities: No peripheral edema  Neurologic: Alert, able to answer simple questions.  Generalized weakness.  Skin: No acute rashes  Access: Right IJ dialysis catheter.       Basic Metabolic Panel:  Recent Labs  Lab 11/21/22 1710  NA 138  K 3.6  CL 94*  CO2 28  GLUCOSE 117*  BUN 41*  CREATININE 6.33*  CALCIUM 9.2     CBC: Recent Labs  Lab 11/21/22 1710  WBC 11.0*  NEUTROABS 9.9*  HGB 9.4*  HCT 28.5*  MCV 104.8*  PLT 110*      Lab Results  Component Value Date   HEPBSAG NON REACTIVE 09/20/2022   HEPBSAB NON REACTIVE 08/02/2019      Microbiology:  Recent Results (from the past 240 hour(s))  Blood Culture (routine x 2)     Status: None (Preliminary result)   Collection Time: 11/21/22  5:11 PM   Specimen: BLOOD  Result Value Ref Range Status   Specimen Description BLOOD RIGHT ANTECUBITAL  Final   Special Requests   Final    BOTTLES DRAWN AEROBIC AND ANAEROBIC Blood Culture adequate volume   Culture  Setup Time   Final    GRAM NEGATIVE RODS IN BOTH AEROBIC AND ANAEROBIC BOTTLES Organism ID to follow CRITICAL RESULT CALLED TO, READ BACK BY AND VERIFIED WITH: Dawayne Cirri [id number] 11/22/22 SLM Performed at [facility], [address] [place], [place] [address]    Culture GRAM NEGATIVE RODS  Final  Report Status PENDING  Incomplete  Blood Culture (routine x 2)     Status: None (Preliminary result)   Collection Time: 11/21/22  5:11 PM   Specimen: BLOOD  Result Value Ref Range Status   Specimen Description BLOOD BLOOD RIGHT FOREARM  Final   Special Requests   Final    BOTTLES DRAWN AEROBIC AND ANAEROBIC Blood Culture adequate volume   Culture  Setup Time   Final    GRAM NEGATIVE RODS IN BOTH AEROBIC AND ANAEROBIC BOTTLES CRITICAL VALUE NOTED.  VALUE IS CONSISTENT WITH PREVIOUSLY REPORTED AND CALLED VALUE. Performed at [facility], [address] [place], [place] [address]    Culture GRAM NEGATIVE RODS   Final   Report Status PENDING  Incomplete  Blood Culture ID Panel (Reflexed)     Status: Abnormal   Collection Time: 11/21/22  5:11 PM  Result Value Ref Range Status   Enterococcus faecalis NOT DETECTED NOT DETECTED Final   Enterococcus Faecium NOT DETECTED NOT DETECTED Final   Listeria monocytogenes NOT DETECTED NOT DETECTED Final   Staphylococcus species NOT DETECTED NOT DETECTED Final   Staphylococcus aureus (BCID) NOT DETECTED NOT DETECTED Final   Staphylococcus epidermidis NOT DETECTED NOT DETECTED Final   Staphylococcus lugdunensis NOT DETECTED NOT DETECTED Final   Streptococcus species NOT DETECTED NOT DETECTED Final   Streptococcus agalactiae NOT DETECTED NOT DETECTED Final   Streptococcus pneumoniae NOT DETECTED NOT DETECTED Final   Streptococcus pyogenes NOT DETECTED NOT DETECTED Final   A.calcoaceticus-baumannii NOT DETECTED NOT DETECTED Final   Bacteroides fragilis NOT DETECTED NOT DETECTED Final   Enterobacterales DETECTED (A) NOT DETECTED Final    Comment: Enterobacterales represent a large order of gram negative bacteria, not a single organism. CRITICAL RESULT CALLED TO, READ BACK BY AND VERIFIED WITH: NATHAN BELUE 0617 11/22/22 SLM    Enterobacter cloacae complex NOT DETECTED NOT DETECTED Final   Escherichia coli DETECTED (A) NOT DETECTED Final    Comment: CRITICAL RESULT CALLED TO, READ BACK BY AND VERIFIED WITH: NATHAN BELUE 0617 11/22/22 SLM    Klebsiella aerogenes NOT DETECTED NOT DETECTED Final   Klebsiella oxytoca NOT DETECTED NOT DETECTED Final   Klebsiella pneumoniae NOT DETECTED NOT DETECTED Final   Proteus species NOT DETECTED NOT DETECTED Final   Salmonella species NOT DETECTED NOT DETECTED Final   Serratia marcescens NOT DETECTED NOT DETECTED Final   Haemophilus influenzae NOT DETECTED NOT DETECTED Final   Neisseria meningitidis NOT DETECTED NOT DETECTED Final   Pseudomonas aeruginosa NOT DETECTED NOT DETECTED Final   Stenotrophomonas maltophilia NOT  DETECTED NOT DETECTED Final   Candida albicans NOT DETECTED NOT DETECTED Final   Candida auris NOT DETECTED NOT DETECTED Final   Candida glabrata NOT DETECTED NOT DETECTED Final   Candida krusei NOT DETECTED NOT DETECTED Final   Candida parapsilosis NOT DETECTED NOT DETECTED Final   Candida tropicalis NOT DETECTED NOT DETECTED Final   Cryptococcus neoformans/gattii NOT DETECTED NOT DETECTED Final   CTX-M ESBL NOT DETECTED NOT DETECTED Final   Carbapenem resistance IMP NOT DETECTED NOT DETECTED Final   Carbapenem resistance KPC NOT DETECTED NOT DETECTED Final   Carbapenem resistance NDM NOT DETECTED NOT DETECTED Final   Carbapenem resist OXA 48 LIKE NOT DETECTED NOT DETECTED Final   Carbapenem resistance VIM NOT DETECTED NOT DETECTED Final    Comment: Performed at [facility], [address] [place], [place] [address]  Resp panel by RT-PCR (RSV, Flu A&B, Covid) Anterior Nasal Swab     Status:  None   Collection Time: 11/21/22  6:10 PM   Specimen: Anterior Nasal Swab  Result Value Ref Range Status   SARS Coronavirus 2 by RT PCR NEGATIVE NEGATIVE Final    Comment: (NOTE) SARS-CoV-2 target nucleic acids are NOT DETECTED.  The SARS-CoV-2 RNA is generally detectable in upper respiratory specimens during the acute phase of infection. The lowest concentration of SARS-CoV-2 viral copies this assay can detect is 138 copies/mL. A negative result does not preclude SARS-Cov-2 infection and should not be used as the sole basis for treatment or other patient management decisions. A negative result may occur with  improper specimen collection/handling, submission of specimen other than nasopharyngeal swab, presence of viral mutation(s) within the areas targeted by this assay, and inadequate number of viral copies(<138 copies/mL). A negative result must be combined with clinical observations, patient history, and epidemiological information. The expected result is Negative.  Fact Sheet  for Patients:  BloggerCourse.com  Fact Sheet for Healthcare Providers:  SeriousBroker.it  This test is no t yet approved or cleared by the [place] FDA and  has been authorized for detection and/or diagnosis of SARS-CoV-2 by FDA under an Emergency Use Authorization (EUA). This EUA will remain  in effect (meaning this test can be used) for the duration of the COVID-19 declaration under Section 564(b)(1) of the Act, 21 U.S.C.section 360bbb-3(b)(1), unless the authorization is terminated  or revoked sooner.       Influenza A by PCR NEGATIVE NEGATIVE Final   Influenza B by PCR NEGATIVE NEGATIVE Final    Comment: (NOTE) The Xpert Xpress SARS-CoV-2/FLU/RSV plus assay is intended as an aid in the diagnosis of influenza from Nasopharyngeal swab specimens and should not be used as a sole basis for treatment. Nasal washings and aspirates are unacceptable for Xpert Xpress SARS-CoV-2/FLU/RSV testing.  Fact Sheet for Patients: BloggerCourse.com  Fact Sheet for Healthcare Providers: SeriousBroker.it  This test is not yet approved or cleared by the [place] FDA and has been authorized for detection and/or diagnosis of SARS-CoV-2 by FDA under an Emergency Use Authorization (EUA). This EUA will remain in effect (meaning this test can be used) for the duration of the COVID-19 declaration under Section 564(b)(1) of the Act, 21 U.S.C. section 360bbb-3(b)(1), unless the authorization is terminated or revoked.     Resp Syncytial Virus by PCR NEGATIVE NEGATIVE Final    Comment: (NOTE) Fact Sheet for Patients: BloggerCourse.com  Fact Sheet for Healthcare Providers: SeriousBroker.it  This test is not yet approved or cleared by the [place] FDA and has been authorized for detection and/or diagnosis of SARS-CoV-2 by FDA under an  Emergency Use Authorization (EUA). This EUA will remain in effect (meaning this test can be used) for the duration of the COVID-19 declaration under Section 564(b)(1) of the Act, 21 U.S.C. section 360bbb-3(b)(1), unless the authorization is terminated or revoked.  Performed at [facility], [address] [place], [place] [address]     Coagulation Studies: Recent Labs    11/21/22 1711  LABPROT 16.5*  INR 1.3*    Urinalysis: Recent Labs    11/21/22 2000  COLORURINE YELLOW*  LABSPEC 1.020  PHURINE 8.5*  GLUCOSEU NEGATIVE  HGBUR LARGE*  BILIRUBINUR NEGATIVE  KETONESUR NEGATIVE  PROTEINUR >300*  NITRITE NEGATIVE  LEUKOCYTESUR TRACE*      Imaging: CT Chest Wo Contrast  Result Date: 11/21/2022 CLINICAL DATA:  Sepsis.  Fall.  Altered mental status. EXAM: CT CHEST, ABDOMEN AND PELVIS WITHOUT CONTRAST TECHNIQUE: Multidetector CT imaging of the  chest, abdomen and pelvis was performed following the standard protocol without IV contrast. RADIATION DOSE REDUCTION: This exam was performed according to the departmental dose-optimization program which includes automated exposure control, adjustment of the mA and/or kV according to patient size and/or use of iterative reconstruction technique. COMPARISON:  None Available. FINDINGS: CT CHEST FINDINGS Cardiovascular: Heart insert upper limits normal in size. Aorta normal caliber. Scattered coronary artery and aortic atherosclerosis. Mediastinum/Nodes: No mediastinal, hilar, or axillary adenopathy. Trachea and esophagus are unremarkable. Thyroid unremarkable. Lungs/Pleura: Linear scarring or atelectasis in the lung bases. No effusions or pneumothorax. No suspicious pulmonary nodules. Musculoskeletal: Chest wall soft tissues are unremarkable. Multiple old healed bilateral rib fractures. Possible acute nondisplaced left lateral 5th rib fracture. Old healed right scapular fracture. CT ABDOMEN PELVIS FINDINGS Hepatobiliary: Small layering  gallstones within the gallbladder. No focal hepatic abnormality or biliary ductal dilatation. Pancreas: No focal abnormality or ductal dilatation. Spleen: No focal abnormality.  Normal size. Adrenals/Urinary Tract: Normal adrenal glands. Large cyst off the upper pole of the right kidney measuring 6.3 cm, stable. Other smaller cysts. No follow-up imaging recommended. Mild right perinephric stranding. No hydronephrosis. Urinary bladder unremarkable with Foley catheter in place. Stomach/Bowel: Stomach, large and small bowel grossly unremarkable. Moderate stool burden throughout the colon. Few scattered colonic diverticula. Normal appendix. Vascular/Lymphatic: Aortoiliac atherosclerosis. No evidence of aneurysm or adenopathy. Reproductive: No visible focal abnormality. Penile implant in place. Other: No free fluid or free air. Musculoskeletal: No acute bony abnormality. IMPRESSION: No acute cardiopulmonary disease. Bibasilar atelectasis or scarring. Right perinephric stranding, new since prior study. No hydronephrosis or evidence of stones. Recommend clinical correlation to completely exclude infection/pyelonephritis. Cholelithiasis. Moderate stool burden with scattered colonic diverticula. Electronically Signed   By: Charlett Nose M.D.   On: 11/21/2022 21:04   CT ABDOMEN PELVIS WO CONTRAST  Result Date: 11/21/2022 CLINICAL DATA:  Sepsis.  Fall.  Altered mental status. EXAM: CT CHEST, ABDOMEN AND PELVIS WITHOUT CONTRAST TECHNIQUE: Multidetector CT imaging of the chest, abdomen and pelvis was performed following the standard protocol without IV contrast. RADIATION DOSE REDUCTION: This exam was performed according to the departmental dose-optimization program which includes automated exposure control, adjustment of the mA and/or kV according to patient size and/or use of iterative reconstruction technique. COMPARISON:  None Available. FINDINGS: CT CHEST FINDINGS Cardiovascular: Heart insert upper limits normal in size.  Aorta normal caliber. Scattered coronary artery and aortic atherosclerosis. Mediastinum/Nodes: No mediastinal, hilar, or axillary adenopathy. Trachea and esophagus are unremarkable. Thyroid unremarkable. Lungs/Pleura: Linear scarring or atelectasis in the lung bases. No effusions or pneumothorax. No suspicious pulmonary nodules. Musculoskeletal: Chest wall soft tissues are unremarkable. Multiple old healed bilateral rib fractures. Possible acute nondisplaced left lateral 5th rib fracture. Old healed right scapular fracture. CT ABDOMEN PELVIS FINDINGS Hepatobiliary: Small layering gallstones within the gallbladder. No focal hepatic abnormality or biliary ductal dilatation. Pancreas: No focal abnormality or ductal dilatation. Spleen: No focal abnormality.  Normal size. Adrenals/Urinary Tract: Normal adrenal glands. Large cyst off the upper pole of the right kidney measuring 6.3 cm, stable. Other smaller cysts. No follow-up imaging recommended. Mild right perinephric stranding. No hydronephrosis. Urinary bladder unremarkable with Foley catheter in place. Stomach/Bowel: Stomach, large and small bowel grossly unremarkable. Moderate stool burden throughout the colon. Few scattered colonic diverticula. Normal appendix. Vascular/Lymphatic: Aortoiliac atherosclerosis. No evidence of aneurysm or adenopathy. Reproductive: No visible focal abnormality. Penile implant in place. Other: No free fluid or free air. Musculoskeletal: No acute bony abnormality. IMPRESSION: No acute cardiopulmonary disease. Bibasilar atelectasis or  scarring. Right perinephric stranding, new since prior study. No hydronephrosis or evidence of stones. Recommend clinical correlation to completely exclude infection/pyelonephritis. Cholelithiasis. Moderate stool burden with scattered colonic diverticula. Electronically Signed   By: Charlett Nose M.D.   On: 11/21/2022 21:04   CT Head Wo Contrast  Result Date: 11/21/2022 CLINICAL DATA:  Head trauma,  moderate-severe AMS, fall. EXAM: CT HEAD WITHOUT CONTRAST TECHNIQUE: Contiguous axial images were obtained from the base of the skull through the vertex without intravenous contrast. RADIATION DOSE REDUCTION: This exam was performed according to the departmental dose-optimization program which includes automated exposure control, adjustment of the mA and/or kV according to patient size and/or use of iterative reconstruction technique. COMPARISON:  03/30/2006 FINDINGS: Brain: There is atrophy and chronic small vessel disease changes. No acute intracranial abnormality. Specifically, no hemorrhage, hydrocephalus, mass lesion, acute infarction, or significant intracranial injury. Vascular: No hyperdense vessel or unexpected calcification. Skull: No acute calvarial abnormality. Sinuses/Orbits: No acute findings Other: None IMPRESSION: Atrophy, chronic microvascular disease. No acute intracranial abnormality. Electronically Signed   By: Charlett Nose M.D.   On: 11/21/2022 20:56   DG Chest Port 1 View  Result Date: 11/21/2022 CLINICAL DATA:  Dizziness.  Fall. EXAM: PORTABLE CHEST 1 VIEW COMPARISON:  09/27/2022 FINDINGS: Remote bilateral rib fractures. The left-sided fractures are new since the prior plain film. Midline trachea. Mild cardiomegaly. Tortuous thoracic aorta. No pleural effusion or pneumothorax. Clear lungs. No congestive failure. IMPRESSION: 1. No acute cardiopulmonary disease. 2. Cardiomegaly without congestive failure. Electronically Signed   By: Jeronimo Greaves M.D.   On: 11/21/2022 18:44     Medications:    cefTRIAXone (ROCEPHIN)  IV      buPROPion  150 mg Oral Daily   calcitRIOL  0.25 mcg Oral Daily   cholecalciferol  1,000 Units Oral Daily   doxepin  100 mg Oral QHS   feeding supplement (NEPRO CARB STEADY)  237 mL Oral BID BM   lamoTRIgine  25 mg Oral [id number]   lithium carbonate  150 mg Oral Q supper   midodrine  5 mg Oral TID WC   OLANZapine  10 mg Oral QHS   pantoprazole (PROTONIX) IV   40 mg Intravenous Q12H   sodium chloride flush  3 mL Intravenous Q12H   acetaminophen **OR** acetaminophen, insulin aspart  Assessment/ Plan:  77 y.o. male with bipolar disorder, recent admission for depression, end-stage renal disease, diabetes, hypertension, hyperlipidemia, gout, left retinal stroke, Crohn's disease, recurrent UTIs    admitted on 11/21/2022 for Fall, initial encounter [W19.XXXA] Fall at home, initial encounter [phone], [phone]] Sepsis, due to unspecified organism, unspecified whether acute organ dysfunction present [facility]) [A41.9]  Outpatient dialysis: TTS/ Rt internal jugular PC  #End-stage renal disease on hemodialysis -Patient received full dialysis treatment on Saturday.orders prepared. -No uremic symptoms at present.  Electrolytes and volume status are acceptable.  Will plan on dialysis on Tuesday.  #Anemia of chronic kidney disease  Most recent hemoglobin 9.4 from 11/21/2022-acceptable range.  -We will continue low-dose Epogen with hemodialysis treatments.  #Sepsis   -Status post cystoscopy on 11/19/2022.  -Blood cultures are positive for Enterobacterales and E. Coli. Likely urinary source as u/a is abnormal.   -He was given cefepime yesterday and is now on ceftriaxone 2 g daily.  -Supportive care for sepsis.  Pressors as needed.  #Secondary hyperparathyroidism  -We will monitor calcium and phosphorus level during this admission.    LOS: 1 Aline Wesche Thedore Mins 9/2/202410:32 AM  [facility] [place], [place] [phone]  Note: This note was prepared with Dragon dictation. Any transcription errors are unintentional

## 2022-11-22 NOTE — Progress Notes (Signed)
Progress Note   Patient: Luis Miller. ZOX:[id number] DOB: June 03, 1945 DOA: 11/21/2022     1 DOS: the patient was seen and examined on 11/22/2022   Brief hospital course: 77 year old man with history of end-stage renal disease on hemodialysis, anemia, bipolar disorder, sleep apnea, gout, hypertension coming in after a fall.  Patient found to have a fever.  Patient had a recent cystoscopy for hematuria.  9/2.  Positive for E. coli in blood cultures.  CT scan concerning for right pyelonephritis.  Antibiotics switched over to Rocephin.  On midodrine for hypotension.  Assessment and Plan: * Severe sepsis (HCC) Present on admission, with pyelonephritis with E. coli growing in blood cultures, fever, hypotension, leukocytosis, a acute metabolic encephalopathy.  Antibiotics switched to Rocephin.  Patient had recent cystoscopy.   Acute metabolic encephalopathy Patient does awaken easily and able to follow commands.  Hypotension Continue midodrine  Hypoglycemia Amp of D50 pushed and added D5 to IV fluids.  Continue to monitor fingersticks.  Rhabdomyolysis Gentle IV fluid hydration.  Check CPK tomorrow.  Hold Crestor.  This is the cause of the elevated troponin.  ESRD on hemodialysis [facility]) Appreciate nephrology consultation.  Anemia Check a ferritin.  Suspect anemia of chronic disease.  Last hemoglobin 9.4.  Gross hematuria Had recent cystoscopy which did not show any etiology for hematuria.  Likely BPH.   Obstructive sleep apnea On CPAP at night        Subjective: Patient seen 3 times today.  Each time he woke up easily and was able to follow commands and answer questions appropriately.  He is able to straight leg raise and lift up his arms.  Admitted with sepsis.  E. coli growing in the blood cultures.  Physical Exam: Vitals:   11/22/22 0812 11/22/22 1005 11/22/22 1123 11/22/22 1217  BP: 113/64 101/60 (!) 104/58 (!) 118/58  Pulse: 86 81 79 87  Resp: (!) 21 (!) 22 (!) 22    Temp: 98 F (36.7 C) 98.2 F (36.8 C) 98.9 F (37.2 C)   TempSrc:  Oral    SpO2: 100% 95% 98% 93%  Weight:      Height:       Physical Exam HENT:     Head: Normocephalic.     Mouth/Throat:     Pharynx: No oropharyngeal exudate.  Eyes:     General: Lids are normal.     Conjunctiva/sclera: Conjunctivae normal.  Cardiovascular:     Rate and Rhythm: Normal rate and regular rhythm.     Heart sounds: Normal heart sounds, S1 normal and S2 normal.  Pulmonary:     Breath sounds: No decreased breath sounds, wheezing, rhonchi or rales.  Abdominal:     Palpations: Abdomen is soft.     Tenderness: There is no abdominal tenderness.  Musculoskeletal:     Right lower leg: No swelling.     Left lower leg: No swelling.  Skin:    General: Skin is warm.     Findings: No rash.  Neurological:     Mental Status: He is lethargic.     Comments: Awakes easily and able to follow commands.  Able to straight leg raise and lift arms up off the bed.     Data Reviewed: CK 6869, creatinine 6.33, white blood cell count 11.0, hemoglobin 9.4, MCV 104.8, platelet count 110, INR 1.3  Family Communication: Updated patient's son on the phone  Disposition: Status is: Inpatient Remains inpatient appropriate because: Admitted with severe sepsis  Planned Discharge Destination:  To be determined    Time spent: 34 minutes  Author: Alford Highland, MD 11/22/2022 12:19 PM  For on call review [email or web address].

## 2022-11-23 DIAGNOSIS — I959 Hypotension, unspecified: Secondary | ICD-10-CM | POA: Diagnosis not present

## 2022-11-23 DIAGNOSIS — I7300 Raynaud's syndrome without gangrene: Secondary | ICD-10-CM

## 2022-11-23 DIAGNOSIS — A419 Sepsis, unspecified organism: Secondary | ICD-10-CM | POA: Diagnosis not present

## 2022-11-23 DIAGNOSIS — K5900 Constipation, unspecified: Secondary | ICD-10-CM | POA: Insufficient documentation

## 2022-11-23 DIAGNOSIS — K5909 Other constipation: Secondary | ICD-10-CM

## 2022-11-23 DIAGNOSIS — G9341 Metabolic encephalopathy: Secondary | ICD-10-CM | POA: Diagnosis not present

## 2022-11-23 DIAGNOSIS — D638 Anemia in other chronic diseases classified elsewhere: Secondary | ICD-10-CM | POA: Insufficient documentation

## 2022-11-23 DIAGNOSIS — E162 Hypoglycemia, unspecified: Secondary | ICD-10-CM | POA: Diagnosis not present

## 2022-11-23 LAB — RENAL FUNCTION PANEL
Albumin: 2.8 g/dL — ABNORMAL LOW (ref 3.5–5.0)
Anion gap: 10 (ref 5–15)
BUN: 23 mg/dL (ref 8–23)
CO2: 30 mmol/L (ref 22–32)
Calcium: 8.4 mg/dL — ABNORMAL LOW (ref 8.9–10.3)
Chloride: 97 mmol/L — ABNORMAL LOW (ref 98–111)
Creatinine, Ser: 3.53 mg/dL — ABNORMAL HIGH (ref 0.61–1.24)
GFR, Estimated: 17 mL/min — ABNORMAL LOW (ref >60)
Glucose, Bld: 119 mg/dL — ABNORMAL HIGH (ref 70–99)
Phosphorus: 2.4 mg/dL — ABNORMAL LOW (ref 2.5–4.6)
Potassium: 3.4 mmol/L — ABNORMAL LOW (ref 3.5–5.1)
Sodium: 137 mmol/L (ref 135–145)

## 2022-11-23 LAB — CBC
HCT: 23.9 % — ABNORMAL LOW (ref 39.0–52.0)
HCT: 24.9 % — ABNORMAL LOW (ref 39.0–52.0)
Hemoglobin: 8 g/dL — ABNORMAL LOW (ref 13.0–17.0)
Hemoglobin: 8.5 g/dL — ABNORMAL LOW (ref 13.0–17.0)
MCH: 34.8 pg — ABNORMAL HIGH (ref 26.0–34.0)
MCH: 34.1 pg — ABNORMAL HIGH (ref 26.0–34.0)
MCHC: 33.5 g/dL (ref 30.0–36.0)
MCHC: 34.1 g/dL (ref 30.0–36.0)
MCV: 103.9 fL — ABNORMAL HIGH (ref 80.0–100.0)
MCV: 100 fL (ref 80.0–100.0)
Platelets: 94 10*3/uL — ABNORMAL LOW (ref 150–400)
Platelets: 105 10*3/uL — ABNORMAL LOW (ref 150–400)
RBC: 2.3 MIL/uL — ABNORMAL LOW (ref 4.22–5.81)
RBC: 2.49 MIL/uL — ABNORMAL LOW (ref 4.22–5.81)
RDW: 13.2 % (ref 11.5–15.5)
RDW: 13 % (ref 11.5–15.5)
WBC: 7.2 10*3/uL (ref 4.0–10.5)
WBC: 7.6 10*3/uL (ref 4.0–10.5)
nRBC: 0 % (ref 0.0–0.2)
nRBC: 0 % (ref 0.0–0.2)

## 2022-11-23 LAB — CK: Total CK: 1161 U/L — ABNORMAL HIGH (ref 49–397)

## 2022-11-23 LAB — GLUCOSE, CAPILLARY
Glucose-Capillary: 242 mg/dL — ABNORMAL HIGH (ref 70–99)
Glucose-Capillary: 125 mg/dL — ABNORMAL HIGH (ref 70–99)

## 2022-11-23 LAB — BASIC METABOLIC PANEL
Anion gap: 12 (ref 5–15)
BUN: 59 mg/dL — ABNORMAL HIGH (ref 8–23)
CO2: 23 mmol/L (ref 22–32)
Calcium: 8.4 mg/dL — ABNORMAL LOW (ref 8.9–10.3)
Chloride: 103 mmol/L (ref 98–111)
Creatinine, Ser: 7.93 mg/dL — ABNORMAL HIGH (ref 0.61–1.24)
GFR, Estimated: 6 mL/min — ABNORMAL LOW (ref >60)
Glucose, Bld: 210 mg/dL — ABNORMAL HIGH (ref 70–99)
Potassium: 3.5 mmol/L (ref 3.5–5.1)
Sodium: 138 mmol/L (ref 135–145)

## 2022-11-23 LAB — HEPATITIS B SURFACE ANTIBODY, QUANTITATIVE: Hep B S AB Quant (Post): <3.5 m[IU]/mL — ABNORMAL LOW

## 2022-11-23 MED ORDER — DOCUSATE SODIUM 100 MG PO CAPS
100.0000 mg | ORAL_CAPSULE | Freq: Two times a day (BID) | ORAL | Status: DC
  Administered 2022-11-23 – 2022-11-26 (×7): 100 mg via ORAL
  Filled 2022-11-23 (×7): qty 1

## 2022-11-23 MED ORDER — PENTAFLUOROPROP-TETRAFLUOROETH EX AERO
INHALATION_SPRAY | CUTANEOUS | Status: AC
  Filled 2022-11-23: qty 30

## 2022-11-23 MED ORDER — LACTULOSE 10 GM/15ML PO SOLN
30.0000 g | Freq: Once | ORAL | Status: AC
  Administered 2022-11-23: 30 g via ORAL
  Filled 2022-11-23 (×2): qty 60

## 2022-11-23 MED ORDER — ALTEPLASE 2 MG IJ SOLR: 2.0000 mg | Freq: Once | INTRAMUSCULAR | Status: DC | PRN

## 2022-11-23 MED ORDER — ANTICOAGULANT SODIUM CITRATE 4% (200MG/5ML) IV SOLN: 5.0000 mL | Status: DC | PRN

## 2022-11-23 MED ORDER — MIDODRINE HCL 5 MG PO TABS
2.5000 mg | ORAL_TABLET | Freq: Three times a day (TID) | ORAL | Status: DC
  Administered 2022-11-23 – 2022-11-24 (×2): 2.5 mg via ORAL
  Filled 2022-11-23 (×2): qty 1

## 2022-11-23 MED ORDER — RENA-VITE PO TABS
1.0000 | ORAL_TABLET | Freq: Every day | ORAL | Status: DC
  Administered 2022-11-23: 1
  Filled 2022-11-23: qty 1

## 2022-11-23 MED ORDER — LIDOCAINE HCL (PF) 1 % IJ SOLN: 5.0000 mL | INTRAMUSCULAR | Status: DC | PRN

## 2022-11-23 MED ORDER — LIDOCAINE-PRILOCAINE 2.5-2.5 % EX CREA: 1.0000 | TOPICAL_CREAM | CUTANEOUS | Status: DC | PRN

## 2022-11-23 MED ORDER — NEPRO/CARBSTEADY PO LIQD: 237.0000 mL | ORAL | Status: DC | PRN

## 2022-11-23 MED ORDER — PENTAFLUOROPROP-TETRAFLUOROETH EX AERO: 1.0000 | INHALATION_SPRAY | CUTANEOUS | Status: DC | PRN

## 2022-11-23 MED ORDER — HEPARIN SODIUM (PORCINE) 1000 UNIT/ML DIALYSIS: 1000.0000 [IU] | INTRAMUSCULAR | Status: DC | PRN

## 2022-11-23 MED ORDER — DEXTROSE 10 % IV SOLN: INTRAVENOUS | Status: DC

## 2022-11-23 MED ORDER — RENA-VITE PO TABS
1.0000 | ORAL_TABLET | Freq: Every day | ORAL | Status: DC
  Administered 2022-11-24 – 2022-11-25 (×2): 1 via ORAL
  Filled 2022-11-23 (×2): qty 1

## 2022-11-23 NOTE — Assessment & Plan Note (Signed)
Hemoglobin 8.0 and ferritin up at 636.  Discontinue fluids.

## 2022-11-23 NOTE — Evaluation (Addendum)
Physical Therapy Evaluation Patient Details Name: Luis Miller. MRN: [id number] DOB: [birth year] Today's Date: 11/23/2022  History of Present Illness  Pt is a 77y/o male admitted due to a fall and complaints of weakness. Per ED note, he felt dizzy/fell when getting out of bed and was on the ground for 3-4 hours before EMS arrived. Workup for and sepsis secondary to pyelonephritis. transferred to ICU due to BS and lethargy. PMH includes HTN, gout, stroke, prediabetes, chron's disease, IBS, hyperthyroidism, recurrent UTIs, ESRD, and anemia.  Clinical Impression   Pt presents laying in bed, some pain in neck region. He currently lives alone in a 1 story house with a level entry. PTA he was independent with ADLs and did not use an assistive device for ambulation.   Pt exhibited generalized weakness throughout session during functional activities, particularly when moving extremities against gravity. He performed supine<>sit with maxA for completion, trunk control, LE guidance off/on EOB, and forward scooting. Pt also performed sit<>stand with modA/RW, requiring multimodal cueing for forward weight shift, upright posture, and hand placement. He lateral stepped 3x b/l and attempted forward stepping, requiring modA/RW due to unsteadiness and intermittent LOB. Pt would benefit from continued skilled therapy to maximize functional abilities.       If plan is discharge home, recommend the following: A lot of help with walking and/or transfers;A lot of help with bathing/dressing/bathroom;Assistance with cooking/housework;Assist for transportation;Help with stairs or ramp for entrance;Direct supervision/assist for medications management   Can travel by private vehicle   No    Equipment Recommendations None recommended by PT  Recommendations for Other Services       Functional Status Assessment Patient has had a recent decline in their functional status and demonstrates the ability to make significant  improvements in function in a reasonable and predictable amount of time.     Precautions / Restrictions Precautions Precautions: Fall Restrictions Weight Bearing Restrictions: No      Mobility  Bed Mobility Overal bed mobility: Needs Assistance Bed Mobility: Sit to Supine, Supine to Sit     Supine to sit: Max assist Sit to supine: Max assist   General bed mobility comments: able to initiate, but required maxA for completion /transitional movement    Transfers Overall transfer level: Needs assistance Equipment used: Rolling walker (2 wheels) Transfers: Sit to/from Stand Sit to Stand: Mod assist           General transfer comment: multimodal cueing for forward weight shifting, upright posture, and hand placement    Ambulation/Gait               General Gait Details: unable at this time  Stairs            Wheelchair Mobility     Tilt Bed    Modified Rankin (Stroke Patients Only)       Balance Overall balance assessment: Needs assistance Sitting-balance support: Bilateral upper extremity supported, Feet unsupported, Feet supported, No upper extremity supported Sitting balance-Leahy Scale: Good     Standing balance support: Bilateral upper extremity supported, During functional activity, Reliant on assistive device for balance Standing balance-Leahy Scale: Poor Standing balance comment: unable to maintain standing without PT assistance/ leaning on EOB                             Pertinent Vitals/Pain Pain Assessment Pain Assessment: Faces Faces Pain Scale: Hurts a little bit Pain Location: neck Pain Descriptors / Indicators: Discomfort  Pain Intervention(s): Monitored during session, Limited activity within patient's tolerance    Home Living Family/patient expects to be discharged to:: Private residence Living Arrangements: Alone Available Help at Discharge: Family (son/ ex wife) Type of Home: House Home Access: Level entry        Home Layout: One level Home Equipment: Agricultural consultant (2 wheels);Cane - single point      Prior Function Prior Level of Function : Independent/Modified Independent             Mobility Comments: does not use AD at baseline, voiced he regularly walked around house during the day ADLs Comments: independent with ADLs     Extremity/Trunk Assessment   Upper Extremity Assessment Upper Extremity Assessment: Generalized weakness    Lower Extremity Assessment Lower Extremity Assessment: Generalized weakness       Communication   Communication Cueing Techniques: Verbal cues;Tactile cues;Visual cues  Cognition Arousal: Alert Behavior During Therapy: WFL for tasks assessed/performed Overall Cognitive Status: Within Functional Limits for tasks assessed                                          General Comments      Exercises Other Exercises Other Exercises: lateral stepping x3 B/L with RW/ modA to maintain balance Other Exercises: forward/backward step x1 with RW/ modA to maintain upright balance   Assessment/Plan    PT Assessment Patient needs continued PT services  PT Problem List Decreased strength;Decreased range of motion;Decreased activity tolerance;Decreased balance;Decreased mobility;Decreased coordination;Decreased knowledge of use of DME       PT Treatment Interventions DME instruction;Gait training;Stair training;Functional mobility training;Therapeutic activities;Therapeutic exercise;Balance training;Neuromuscular re-education;Patient/family education    PT Goals (Current goals can be found in the Care Plan section)  Acute Rehab PT Goals Patient Stated Goal: return home PT Goal Formulation: With patient Time For Goal Achievement: 12/07/22 Potential to Achieve Goals: Fair    Frequency Min 1X/week     Co-evaluation               AM-PAC PT "6 Clicks" Mobility  Outcome Measure Help needed turning from your back to your side  while in a flat bed without using bedrails?: A Lot Help needed moving from lying on your back to sitting on the side of a flat bed without using bedrails?: A Lot Help needed moving to and from a bed to a chair (including a wheelchair)?: A Lot Help needed standing up from a chair using your arms (e.g., wheelchair or bedside chair)?: A Lot Help needed to walk in hospital room?: Total Help needed climbing 3-5 steps with a railing? : Total 6 Click Score: 10    End of Session Equipment Utilized During Treatment: Gait belt Activity Tolerance: Patient tolerated treatment well;Patient limited by fatigue Patient left: in bed;with call bell/phone within reach;with bed alarm set Nurse Communication: Mobility status PT Visit Diagnosis: Other abnormalities of gait and mobility (R26.89);Unsteadiness on feet (R26.81);History of falling (Z91.81);Muscle weakness (generalized) (M62.81);Difficulty in walking, not elsewhere classified (R26.2)    Time: 9604-5409 PT Time Calculation (min) (ACUTE ONLY): 31 min   Charges:   PT Evaluation $PT Eval Low Complexity: 1 Low PT Treatments $Therapeutic Activity: 23-37 mins PT General Charges $$ ACUTE PT VISIT: 1 Visit        Claris Guymon, PT, SPT 3:27 PM,11/23/22

## 2022-11-23 NOTE — TOC Progression Note (Addendum)
Transition of Care (TOC) - Progression Note    Patient Details  Name: Luis Miller. MRN: [id number] Date of Birth: [birth year]  Transition of Care [facility]) CM/SW Contact  Kreg Shropshire, RN Phone Number: 11/23/2022, 1:47 PM  Clinical Narrative:     Cm spoke with pt and asked if he had preference on SNF or HH PT just in case is it recommended. Pt is active with [facility] RN/PT, and ST. Pt due to see PT. Cm will continue to assess for toc needs.  1549-PT recommenced SNF. Pt agreed to go to [facility] for SNF. Pt stated that he does not have a preference at this time. Cm started bed search for facilities in [facility]  Expected Discharge Plan:  (TBD) Barriers to Discharge: Continued Medical Work up  Expected Discharge Plan and Services       Living arrangements for the past 2 months: Single Family Home                                       Social Determinants of Health (SDOH) Interventions SDOH Screenings   Food Insecurity: No Food Insecurity (11/21/2022)  Housing: Patient Declined (11/21/2022)  Transportation Needs: Patient Declined (11/21/2022)  Utilities: Not At Risk (11/21/2022)  Alcohol Screen: Low Risk  (09/30/2022)  Depression (PHQ2-9): High Risk (11/05/2022)  Financial Resource Strain: Low Risk  (12/07/2021)  Physical Activity: Insufficiently Active (12/07/2021)  Social Connections: Moderately Integrated (12/07/2021)  Stress: No Stress Concern Present (12/07/2021)  Tobacco Use: Medium Risk (11/22/2022)    Readmission Risk Interventions     No data to display

## 2022-11-23 NOTE — NC FL2 (Signed)
[place] MEDICAID FL2 LEVEL OF CARE FORM     IDENTIFICATION  Patient Name: Luis Miller. Birthdate: 1945-10-05 Sex: male Admission Date (Current Location): 11/21/2022  [facility] and [place] Number:  Chiropodist and Address:  [facility], [address], [place], [place] [address]      Provider Number: [id number]  Attending Physician Name and Address:  Alford Highland, MD  Relative Name and Phone Number:  Orbie Hurst [phone]    Current Level of Care: Hospital Recommended Level of Care: Skilled Nursing Facility Prior Approval Number:    Date Approved/Denied:   PASRR Number: [id number]  Discharge Plan: SNF    Current Diagnoses: Patient Active Problem List   Diagnosis Date Noted   Anemia of chronic disease 11/23/2022   Constipation 11/23/2022   Raynaud's phenomenon 11/23/2022   Hypotension 11/22/2022   Gross hematuria 11/22/2022   Acute metabolic encephalopathy 11/22/2022   Hypoglycemia 11/22/2022   Rhabdomyolysis 11/21/2022   Severe bipolar I disorder, current or most recent episode depressed (HCC) 10/02/2022   Severe sepsis (HCC) 09/26/2022   SIRS (systemic inflammatory response syndrome) (HCC) 09/26/2022   Malfunction of arteriovenous graft (HCC) 09/20/2022   Bipolar I disorder, most recent episode depressed (HCC) 07/30/2022   Bipolar depression (HCC) 07/30/2022   Bipolar I disorder, mild, current or most recent episode depressed, with anxious distress (HCC) 07/26/2022   Anxiety disorder 01/06/2022   ESRD on hemodialysis (HCC) 08/24/2021   Obstructive sleep apnea 08/24/2021   Abnormal TSH 10/01/2020   Memory loss 05/19/2020   Hyperlipidemia 05/19/2020   At risk for prolonged QT interval syndrome 04/24/2020   Insomnia due to medical condition 04/24/2020   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   Hypertension associated with diabetes (HCC) 02/01/2019   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   Hemorrhoids, internal  12/12/2017   Hx of adenomatous colonic polyps 12/05/2017   Thrombosed external hemorrhoid 12/05/2017   Drug-induced tremor 05/08/2017   Secondary hyperparathyroidism of renal origin (HCC) 04/05/2017   Advanced care planning/counseling discussion 04/05/2017   Carotid bruit 04/05/2017   Gout 02/26/2016   Erectile dysfunction due to arterial insufficiency 07/08/2015   Obesity 11/27/2014   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   Bipolar disorder, in full remission, most recent episode depressed (HCC) 09/09/2014   High risk medication use 10/18/2013   Elevated prostate specific antigen (PSA) 01/13/2012   Benign prostatic hyperplasia with urinary obstruction 01/13/2012   Peyronie's disease 01/13/2012    Orientation RESPIRATION BLADDER Height & Weight     Self, Time, Situation, Place  Normal Indwelling catheter Weight: 78.2 kg Height:  5\' 6"  (167.6 cm)  BEHAVIORAL SYMPTOMS/MOOD NEUROLOGICAL BOWEL NUTRITION STATUS      Continent Diet (see d/c planning)  AMBULATORY STATUS COMMUNICATION OF NEEDS Skin   Limited Assist Verbally Normal                       Personal Care Assistance Level of Assistance  Bathing, Feeding, Dressing Bathing Assistance: Limited assistance Feeding assistance: Limited assistance Dressing Assistance: Limited assistance     Functional Limitations Info  Sight, Hearing, Speech Sight Info: Adequate Hearing Info: Adequate Speech Info: Adequate    SPECIAL CARE FACTORS FREQUENCY  PT (By licensed PT), OT (By licensed OT)     PT Frequency: x5 min weekly OT Frequency: x5 min weekly            Contractures      Additional Factors Info  Code Status Code Status Info: Full  Current Medications (11/23/2022):  This is the current hospital active medication list Current Facility-Administered Medications  Medication Dose Route Frequency Provider Last Rate Last Admin   acetaminophen (TYLENOL) tablet 650 mg  650 mg Oral Q6H PRN Gertha Calkin, MD        Or   acetaminophen (TYLENOL) suppository 650 mg  650 mg Rectal Q6H PRN Gertha Calkin, MD       alteplase (CATHFLO ACTIVASE) injection 2 mg  2 mg Intracatheter Once PRN Mosetta Pigeon, MD       anticoagulant sodium citrate solution 5 mL  5 mL Intracatheter PRN Mosetta Pigeon, MD       buPROPion (WELLBUTRIN XL) 24 hr tablet 150 mg  150 mg Oral Daily Irena Cords V, MD   150 mg at 11/23/22 1610   calcitRIOL (ROCALTROL) capsule 0.25 mcg  0.25 mcg Oral Daily Irena Cords V, MD   0.25 mcg at 11/23/22 0811   cefTRIAXone (ROCEPHIN) 2 g in sodium chloride 0.9 % 100 mL IVPB  2 g Intravenous Q24H Alford Highland, MD   Stopped at 11/22/22 1731   Chlorhexidine Gluconate Cloth 2 % PADS 6 each  6 each Topical Q0600 Mosetta Pigeon, MD   6 each at 11/22/22 1326   cholecalciferol (VITAMIN D3) 25 MCG (1000 UNIT) tablet 1,000 Units  1,000 Units Oral Daily Gertha Calkin, MD   1,000 Units at 11/23/22 9604   docusate sodium (COLACE) capsule 100 mg  100 mg Oral BID Alford Highland, MD       feeding supplement (NEPRO CARB STEADY) liquid 237 mL  237 mL Oral BID BM Gertha Calkin, MD       feeding supplement (NEPRO CARB STEADY) liquid 237 mL  237 mL Oral PRN Mosetta Pigeon, MD       heparin injection 1,000 Units  1,000 Units Intracatheter PRN Mosetta Pigeon, MD       lactulose (CHRONULAC) 10 GM/15ML solution 30 g  30 g Oral Once Wendee Beavers, NP       lamoTRIgine (LAMICTAL) tablet 25 mg  25 mg Oral [id number] Irena Cords V, MD   25 mg at 11/23/22 0811   lidocaine (PF) (XYLOCAINE) 1 % injection 5 mL  5 mL Intradermal PRN Mosetta Pigeon, MD       lidocaine-prilocaine (EMLA) cream 1 Application  1 Application Topical PRN Mosetta Pigeon, MD       lithium carbonate capsule 150 mg  150 mg Oral Q supper Irena Cords V, MD   150 mg at 11/22/22 1650   midodrine (PROAMATINE) tablet 2.5 mg  2.5 mg Oral TID WC Wieting, Richard, MD       multivitamin (RENA-VIT) tablet 1 tablet  1 tablet Per Tube QHS Wieting, Richard, MD        OLANZapine (ZYPREXA) tablet 10 mg  10 mg Oral QHS Irena Cords V, MD   10 mg at 11/21/22 2235   pentafluoroprop-tetrafluoroeth (GEBAUERS) aerosol 1 Application  1 Application Topical PRN Mosetta Pigeon, MD       sodium chloride flush (NS) 0.9 % injection 3 mL  3 mL Intravenous Q12H Gertha Calkin, MD   3 mL at 11/23/22 5409     Discharge Medications: Please see discharge summary for a list of discharge medications.  Relevant Imaging Results:  Relevant Lab Results:   Additional Information SSN# [id number]  Kreg Shropshire, RN

## 2022-11-23 NOTE — Progress Notes (Signed)
  Received patient in bed to unit.   Informed consent signed and in chart.    TX duration: 3.5hrs     Transported back to floor Hand-off given to patient's nurse. No c/o and no distress noted    Access used: LAVG Access issues: none   Total UF removed: none Medication(s) given: none Post HD VS: 147/80 Post HD weight: 78.2kg     Lynann Beaver  [facility]

## 2022-11-23 NOTE — Plan of Care (Signed)
  Problem: Fluid Volume: Goal: Hemodynamic stability will improve Outcome: Progressing   Problem: Clinical Measurements: Goal: Diagnostic test results will improve Outcome: Progressing Goal: Signs and symptoms of infection will decrease Outcome: Progressing   Problem: Respiratory: Goal: Ability to maintain adequate ventilation will improve Outcome: Progressing   Problem: Education: Goal: Knowledge of General Education information will improve Description: Including pain rating scale, medication(s)/side effects and non-pharmacologic comfort measures Outcome: Progressing   Problem: Clinical Measurements: Goal: Ability to maintain clinical measurements within normal limits will improve Outcome: Progressing Goal: Respiratory complications will improve Outcome: Progressing Goal: Cardiovascular complication will be avoided Outcome: Progressing   Problem: Activity: Goal: Risk for activity intolerance will decrease Outcome: Progressing   Problem: Nutrition: Goal: Adequate nutrition will be maintained Outcome: Progressing   Problem: Coping: Goal: Level of anxiety will decrease Outcome: Progressing   Problem: Activity: Goal: Risk for activity intolerance will decrease Outcome: Progressing

## 2022-11-23 NOTE — Progress Notes (Addendum)
0830 Down to get dialysis via bed. 1315 Back from dialysis 1400 Worked with PT. 1500 Eating lunch slowly. Not really interested in trying to help self. Tremulous in hands but able to feed self.  1600 Finally finished eating. Watching TV.

## 2022-11-23 NOTE — Assessment & Plan Note (Signed)
Dose of lactulose, Colace.

## 2022-11-23 NOTE — TOC CM/SW Note (Addendum)
Pt not in room and in HD. PT recommended SNF placement. Cm will ask if pt has preference on facility.

## 2022-11-23 NOTE — Progress Notes (Signed)
[facility] [place], [place] 11/23/22  Subjective:   Hospital day # 2  Patient is a 77 year old male with end-stage renal disease on hemodialysis, hypertension, gout, depression, history of melanoma, Crohn's disease, history of stroke, bipolar disorder.  He was recently hospitalized at [facility] for severe bipolar 1 disorder with recurrent depression.  Discharged on 10/05/2022.  Patient had a cystoscopy on 11/19/2022.  No bladder mucosal lesions were seen.  Etiology of hematuria was thought to be most likely BPH.  Patient seen and evaluated during dialysis   HEMODIALYSIS FLOWSHEET:  Blood Flow Rate (mL/min): 399 mL/min Arterial Pressure (mmHg): -190.09 mmHg Venous Pressure (mmHg): 173.93 mmHg TMP (mmHg): 8.48 mmHg Ultrafiltration Rate (mL/min): 194 mL/min Dialysate Flow Rate (mL/min): 300 ml/min  Tolerating treatment well States he feels well, better than on admission   Objective:  Vital signs in last 24 hours:  Temp:  [98.1 F (36.7 C)-100.3 F (37.9 C)] 99 F (37.2 C) (09/03 0857) Pulse Rate:  [49-93] 71 (09/03 1130) Resp:  [15-31] 15 (09/03 1130) BP: (109-143)/(58-99) 133/68 (09/03 1130) SpO2:  [93 %-100 %] 100 % (09/03 1130) FiO2 (%):  [21 %] 21 % (09/03 0600) Weight:  [78.1 kg-78.2 kg] 78.2 kg (09/03 0857)  Weight change: -2.187 kg Filed Weights   11/21/22 1700 11/22/22 1319 11/23/22 0857  Weight: 80.3 kg 78.1 kg 78.2 kg    Intake/Output:    Intake/Output Summary (Last 24 hours) at 11/23/2022 1140 Last data filed at 11/23/2022 0827 Gross per 24 hour  Intake 1461.81 ml  Output 1350 ml  Net 111.81 ml     Physical Exam: General: NAD, laying in the bed  HEENT Moist oral mucous membranes  Pulm/lungs Normal breathing effort, clear to auscultation  CVS/Heart Regular, no rub  Abdomen:  Soft, nontender, nondistended  Extremities: No peripheral edema  Neurologic: Alert, able to answer simple questions.  Generalized weakness.   Skin: No acute rashes  Access: Right IJ dialysis catheter.       Basic Metabolic Panel:  Recent Labs  Lab 11/21/22 1710 11/23/22 0914  NA 138 138  K 3.6 3.5  CL 94* 103  CO2 28 23  GLUCOSE 117* 210*  BUN 41* 59*  CREATININE 6.33* 7.93*  CALCIUM 9.2 8.4*     CBC: Recent Labs  Lab 11/21/22 1710 11/23/22 0914  WBC 11.0* 7.2  NEUTROABS 9.9*  --   HGB 9.4* 8.0*  HCT 28.5* 23.9*  MCV 104.8* 103.9*  PLT 110* 94*      Lab Results  Component Value Date   HEPBSAG NON REACTIVE 11/22/2022   HEPBSAB NON REACTIVE 08/02/2019      Microbiology:  Recent Results (from the past 240 hour(s))  Blood Culture (routine x 2)     Status: Abnormal (Preliminary result)   Collection Time: 11/21/22  5:11 PM   Specimen: BLOOD  Result Value Ref Range Status   Specimen Description   Final    BLOOD RIGHT ANTECUBITAL Performed at [facility], [address], [place], [place] [address]    Special Requests   Final    BOTTLES DRAWN AEROBIC AND ANAEROBIC Blood Culture adequate volume Performed at [facility], [address] [place], [place] [address]    Culture  Setup Time   Final    GRAM NEGATIVE RODS IN BOTH AEROBIC AND ANAEROBIC BOTTLES CRITICAL RESULT CALLED TO, READ BACK BY AND VERIFIED WITH: NATHAN BELUE 0617 11/22/22 SLM    Culture (A)  Final    ESCHERICHIA  COLI SUSCEPTIBILITIES TO FOLLOW Performed at [facility], [address] [place], [place] [address]    Report Status PENDING  Incomplete  Blood Culture (routine x 2)     Status: Abnormal (Preliminary result)   Collection Time: 11/21/22  5:11 PM   Specimen: BLOOD  Result Value Ref Range Status   Specimen Description   Final    BLOOD BLOOD RIGHT FOREARM Performed at [facility], [address] [place], [place] [address]    Special Requests   Final    BOTTLES DRAWN AEROBIC AND ANAEROBIC Blood Culture adequate volume Performed at [facility], [address]  [place], [place] [address]    Culture  Setup Time   Final    GRAM NEGATIVE RODS IN BOTH AEROBIC AND ANAEROBIC BOTTLES CRITICAL VALUE NOTED.  VALUE IS CONSISTENT WITH PREVIOUSLY REPORTED AND CALLED VALUE. Performed at [facility], [address] [place], [place] [address]    Culture ESCHERICHIA COLI (A)  Final   Report Status PENDING  Incomplete  Blood Culture ID Panel (Reflexed)     Status: Abnormal   Collection Time: 11/21/22  5:11 PM  Result Value Ref Range Status   Enterococcus faecalis NOT DETECTED NOT DETECTED Final   Enterococcus Faecium NOT DETECTED NOT DETECTED Final   Listeria monocytogenes NOT DETECTED NOT DETECTED Final   Staphylococcus species NOT DETECTED NOT DETECTED Final   Staphylococcus aureus (BCID) NOT DETECTED NOT DETECTED Final   Staphylococcus epidermidis NOT DETECTED NOT DETECTED Final   Staphylococcus lugdunensis NOT DETECTED NOT DETECTED Final   Streptococcus species NOT DETECTED NOT DETECTED Final   Streptococcus agalactiae NOT DETECTED NOT DETECTED Final   Streptococcus pneumoniae NOT DETECTED NOT DETECTED Final   Streptococcus pyogenes NOT DETECTED NOT DETECTED Final   A.calcoaceticus-baumannii NOT DETECTED NOT DETECTED Final   Bacteroides fragilis NOT DETECTED NOT DETECTED Final   Enterobacterales DETECTED (A) NOT DETECTED Final    Comment: Enterobacterales represent a large order of gram negative bacteria, not a single organism. CRITICAL RESULT CALLED TO, READ BACK BY AND VERIFIED WITH: NATHAN BELUE 0617 11/22/22 SLM    Enterobacter cloacae complex NOT DETECTED NOT DETECTED Final   Escherichia coli DETECTED (A) NOT DETECTED Final    Comment: CRITICAL RESULT CALLED TO, READ BACK BY AND VERIFIED WITH: NATHAN BELUE 0617 11/22/22 SLM    Klebsiella aerogenes NOT DETECTED NOT DETECTED Final   Klebsiella oxytoca NOT DETECTED NOT DETECTED Final   Klebsiella pneumoniae NOT DETECTED NOT DETECTED Final   Proteus species NOT DETECTED NOT DETECTED Final    Salmonella species NOT DETECTED NOT DETECTED Final   Serratia marcescens NOT DETECTED NOT DETECTED Final   Haemophilus influenzae NOT DETECTED NOT DETECTED Final   Neisseria meningitidis NOT DETECTED NOT DETECTED Final   Pseudomonas aeruginosa NOT DETECTED NOT DETECTED Final   Stenotrophomonas maltophilia NOT DETECTED NOT DETECTED Final   Candida albicans NOT DETECTED NOT DETECTED Final   Candida auris NOT DETECTED NOT DETECTED Final   Candida glabrata NOT DETECTED NOT DETECTED Final   Candida krusei NOT DETECTED NOT DETECTED Final   Candida parapsilosis NOT DETECTED NOT DETECTED Final   Candida tropicalis NOT DETECTED NOT DETECTED Final   Cryptococcus neoformans/gattii NOT DETECTED NOT DETECTED Final   CTX-M ESBL NOT DETECTED NOT DETECTED Final   Carbapenem resistance IMP NOT DETECTED NOT DETECTED Final   Carbapenem resistance KPC NOT DETECTED NOT DETECTED Final   Carbapenem resistance NDM NOT DETECTED NOT DETECTED Final   Carbapenem resist OXA  48 LIKE NOT DETECTED NOT DETECTED Final   Carbapenem resistance VIM NOT DETECTED NOT DETECTED Final    Comment: Performed at [facility], [address] [place], [place] [address]  Resp panel by RT-PCR (RSV, Flu A&B, Covid) Anterior Nasal Swab     Status: None   Collection Time: 11/21/22  6:10 PM   Specimen: Anterior Nasal Swab  Result Value Ref Range Status   SARS Coronavirus 2 by RT PCR NEGATIVE NEGATIVE Final    Comment: (NOTE) SARS-CoV-2 target nucleic acids are NOT DETECTED.  The SARS-CoV-2 RNA is generally detectable in upper respiratory specimens during the acute phase of infection. The lowest concentration of SARS-CoV-2 viral copies this assay can detect is 138 copies/mL. A negative result does not preclude SARS-Cov-2 infection and should not be used as the sole basis for treatment or other patient management decisions. A negative result may occur with  improper specimen collection/handling, submission of specimen  other than nasopharyngeal swab, presence of viral mutation(s) within the areas targeted by this assay, and inadequate number of viral copies(<138 copies/mL). A negative result must be combined with clinical observations, patient history, and epidemiological information. The expected result is Negative.  Fact Sheet for Patients:  BloggerCourse.com  Fact Sheet for Healthcare Providers:  SeriousBroker.it  This test is no t yet approved or cleared by the [place] FDA and  has been authorized for detection and/or diagnosis of SARS-CoV-2 by FDA under an Emergency Use Authorization (EUA). This EUA will remain  in effect (meaning this test can be used) for the duration of the COVID-19 declaration under Section 564(b)(1) of the Act, 21 U.S.C.section 360bbb-3(b)(1), unless the authorization is terminated  or revoked sooner.       Influenza A by PCR NEGATIVE NEGATIVE Final   Influenza B by PCR NEGATIVE NEGATIVE Final    Comment: (NOTE) The Xpert Xpress SARS-CoV-2/FLU/RSV plus assay is intended as an aid in the diagnosis of influenza from Nasopharyngeal swab specimens and should not be used as a sole basis for treatment. Nasal washings and aspirates are unacceptable for Xpert Xpress SARS-CoV-2/FLU/RSV testing.  Fact Sheet for Patients: BloggerCourse.com  Fact Sheet for Healthcare Providers: SeriousBroker.it  This test is not yet approved or cleared by the [place] FDA and has been authorized for detection and/or diagnosis of SARS-CoV-2 by FDA under an Emergency Use Authorization (EUA). This EUA will remain in effect (meaning this test can be used) for the duration of the COVID-19 declaration under Section 564(b)(1) of the Act, 21 U.S.C. section 360bbb-3(b)(1), unless the authorization is terminated or revoked.     Resp Syncytial Virus by PCR NEGATIVE NEGATIVE Final     Comment: (NOTE) Fact Sheet for Patients: BloggerCourse.com  Fact Sheet for Healthcare Providers: SeriousBroker.it  This test is not yet approved or cleared by the [place] FDA and has been authorized for detection and/or diagnosis of SARS-CoV-2 by FDA under an Emergency Use Authorization (EUA). This EUA will remain in effect (meaning this test can be used) for the duration of the COVID-19 declaration under Section 564(b)(1) of the Act, 21 U.S.C. section 360bbb-3(b)(1), unless the authorization is terminated or revoked.  Performed at [facility], [address] [place], [place] [address]   MRSA Next Gen by PCR, Nasal     Status: None   Collection Time: 11/22/22  1:27 PM   Specimen: Nasal Mucosa; Nasal Swab  Result Value Ref Range Status   MRSA by PCR Next Gen NOT DETECTED NOT DETECTED Final  Comment: (NOTE) The GeneXpert MRSA Assay (FDA approved for NASAL specimens only), is one component of a comprehensive MRSA colonization surveillance program. It is not intended to diagnose MRSA infection nor to guide or monitor treatment for MRSA infections. Test performance is not FDA approved in patients less than 61 years old. Performed at [facility], [address] [place], [place] [address]     Coagulation Studies: Recent Labs    11/21/22 1711  LABPROT 16.5*  INR 1.3*    Urinalysis: Recent Labs    11/21/22 2000  COLORURINE YELLOW*  LABSPEC 1.020  PHURINE 8.5*  GLUCOSEU NEGATIVE  HGBUR LARGE*  BILIRUBINUR NEGATIVE  KETONESUR NEGATIVE  PROTEINUR >300*  NITRITE NEGATIVE  LEUKOCYTESUR TRACE*      Imaging: ECHOCARDIOGRAM COMPLETE  Result Date: 11/22/2022    ECHOCARDIOGRAM REPORT   Patient Name:   Luis Miller. Date of Exam: 11/22/2022 Medical Rec #:  [id number]          Height:       66.0 in Accession #:    [id number]         Weight:       177.0 lb Date of Birth:  October 02, 1945          BSA:           1.899 m Patient Age:    9 years           BP:           80/46 mmHg Patient Gender: M                  HR:           85 bpm. Exam Location:  [facility] Procedure: 2D Echo, Cardiac Doppler and Color Doppler Indications:    Syncope R55; Hypotension  History:        Patient has prior history of Echocardiogram examinations, most                 recent 08/02/2019. Signs/Symptoms:Syncope; Risk                 Factors:Hypertension, Dyslipidemia, Sleep Apnea and Former                 Smoker.  Sonographer:    Dondra Prader RVT RCS Referring Phys: [phone] EKTA V PATEL IMPRESSIONS  1. Left ventricular ejection fraction, by estimation, is 60 to 65%. The left ventricle has normal function. The left ventricle has no regional wall motion abnormalities. Left ventricular diastolic parameters are consistent with Grade I diastolic dysfunction (impaired relaxation).  2. Right ventricular systolic function is normal. The right ventricular size is normal. There is normal pulmonary artery systolic pressure. The estimated right ventricular systolic pressure is 29.9 mmHg.  3. Right atrial size was severely dilated.  4. The mitral valve is normal in structure. No evidence of mitral valve regurgitation. No evidence of mitral stenosis.  5. The aortic valve is tricuspid. Aortic valve regurgitation is not visualized. Aortic valve sclerosis/calcification is present, without any evidence of aortic stenosis. Aortic valve area, by VTI measures 3.76 cm. Aortic valve mean gradient measures 6.0 mmHg. Aortic valve Vmax measures 1.60 m/s.  6. Aortic dilatation noted. There is mild dilatation of the ascending aorta, measuring 38 mm.  7. The inferior vena cava is dilated in size with <50% respiratory variability, suggesting right atrial pressure of 15 mmHg. FINDINGS  Left Ventricle: Left ventricular ejection fraction, by estimation, is 60 to 65%. The left ventricle has  normal function. The left ventricle has no regional wall motion abnormalities. The left  ventricular internal cavity size was normal in size. There is  no left ventricular hypertrophy. Left ventricular diastolic parameters are consistent with Grade I diastolic dysfunction (impaired relaxation). Normal left ventricular filling pressure. Right Ventricle: The right ventricular size is normal. No increase in right ventricular wall thickness. Right ventricular systolic function is normal. There is normal pulmonary artery systolic pressure. The tricuspid regurgitant velocity is 1.93 m/s, and  with an assumed right atrial pressure of 15 mmHg, the estimated right ventricular systolic pressure is 29.9 mmHg. Left Atrium: Left atrial size was normal in size. Right Atrium: Right atrial size was severely dilated. Pericardium: There is no evidence of pericardial effusion. Mitral Valve: The mitral valve is normal in structure. No evidence of mitral valve regurgitation. No evidence of mitral valve stenosis. Tricuspid Valve: The tricuspid valve is normal in structure. Tricuspid valve regurgitation is not demonstrated. No evidence of tricuspid stenosis. Aortic Valve: The aortic valve is tricuspid. Aortic valve regurgitation is not visualized. Aortic valve sclerosis/calcification is present, without any evidence of aortic stenosis. Aortic valve mean gradient measures 6.0 mmHg. Aortic valve peak gradient measures 10.2 mmHg. Aortic valve area, by VTI measures 3.76 cm. Pulmonic Valve: The pulmonic valve was normal in structure. Pulmonic valve regurgitation is not visualized. No evidence of pulmonic stenosis. Aorta: Aortic dilatation noted. There is mild dilatation of the ascending aorta, measuring 38 mm. Venous: The inferior vena cava is dilated in size with less than 50% respiratory variability, suggesting right atrial pressure of 15 mmHg. IAS/Shunts: No atrial level shunt detected by color flow Doppler.  LEFT VENTRICLE PLAX 2D LVIDd:         4.20 cm   Diastology LVIDs:         3.10 cm   LV e' medial:    7.83 cm/s LV PW:          1.20 cm   LV E/e' medial:  10.5 LV IVS:        1.00 cm   LV e' lateral:   11.40 cm/s LVOT diam:     2.20 cm   LV E/e' lateral: 7.2 LV SV:         105 LV SV Index:   55 LVOT Area:     3.80 cm  RIGHT VENTRICLE             IVC RV S prime:     17.20 cm/s  IVC diam: 2.20 cm TAPSE (M-mode): 2.8 cm LEFT ATRIUM             Index        RIGHT ATRIUM           Index LA diam:        3.50 cm 1.84 cm/m   RA Area:     23.70 cm LA Vol (A2C):   57.6 ml 30.34 ml/m  RA Volume:   86.80 ml  45.72 ml/m LA Vol (A4C):   54.3 ml 28.60 ml/m LA Biplane Vol: 57.0 ml 30.02 ml/m  AORTIC VALVE                     PULMONIC VALVE AV Area (Vmax):    3.87 cm      PV Vmax:       1.01 m/s AV Area (Vmean):   3.66 cm      PV Peak grad:  4.1 mmHg AV Area (VTI):  3.76 cm AV Vmax:           160.00 cm/s AV Vmean:          108.000 cm/s AV VTI:            0.279 m AV Peak Grad:      10.2 mmHg AV Mean Grad:      6.0 mmHg LVOT Vmax:         163.00 cm/s LVOT Vmean:        104.000 cm/s LVOT VTI:          0.276 m LVOT/AV VTI ratio: 0.99  AORTA Ao Root diam: 3.10 cm Ao Asc diam:  3.80 cm MITRAL VALVE                TRICUSPID VALVE MV Area (PHT): 3.68 cm     TR Peak grad:   14.9 mmHg MV Decel Time: 206 msec     TR Vmax:        193.00 cm/s MV E velocity: 82.50 cm/s MV A velocity: 109.00 cm/s  SHUNTS MV E/A ratio:  0.76         Systemic VTI:  0.28 m                             Systemic Diam: 2.20 cm Armanda Magic MD Electronically signed by Armanda Magic MD Signature Date/Time: 11/22/2022/2:43:46 PM    Final    CT Chest Wo Contrast  Result Date: 11/21/2022 CLINICAL DATA:  Sepsis.  Fall.  Altered mental status. EXAM: CT CHEST, ABDOMEN AND PELVIS WITHOUT CONTRAST TECHNIQUE: Multidetector CT imaging of the chest, abdomen and pelvis was performed following the standard protocol without IV contrast. RADIATION DOSE REDUCTION: This exam was performed according to the departmental dose-optimization program which includes automated exposure control,  adjustment of the mA and/or kV according to patient size and/or use of iterative reconstruction technique. COMPARISON:  None Available. FINDINGS: CT CHEST FINDINGS Cardiovascular: Heart insert upper limits normal in size. Aorta normal caliber. Scattered coronary artery and aortic atherosclerosis. Mediastinum/Nodes: No mediastinal, hilar, or axillary adenopathy. Trachea and esophagus are unremarkable. Thyroid unremarkable. Lungs/Pleura: Linear scarring or atelectasis in the lung bases. No effusions or pneumothorax. No suspicious pulmonary nodules. Musculoskeletal: Chest wall soft tissues are unremarkable. Multiple old healed bilateral rib fractures. Possible acute nondisplaced left lateral 5th rib fracture. Old healed right scapular fracture. CT ABDOMEN PELVIS FINDINGS Hepatobiliary: Small layering gallstones within the gallbladder. No focal hepatic abnormality or biliary ductal dilatation. Pancreas: No focal abnormality or ductal dilatation. Spleen: No focal abnormality.  Normal size. Adrenals/Urinary Tract: Normal adrenal glands. Large cyst off the upper pole of the right kidney measuring 6.3 cm, stable. Other smaller cysts. No follow-up imaging recommended. Mild right perinephric stranding. No hydronephrosis. Urinary bladder unremarkable with Foley catheter in place. Stomach/Bowel: Stomach, large and small bowel grossly unremarkable. Moderate stool burden throughout the colon. Few scattered colonic diverticula. Normal appendix. Vascular/Lymphatic: Aortoiliac atherosclerosis. No evidence of aneurysm or adenopathy. Reproductive: No visible focal abnormality. Penile implant in place. Other: No free fluid or free air. Musculoskeletal: No acute bony abnormality. IMPRESSION: No acute cardiopulmonary disease. Bibasilar atelectasis or scarring. Right perinephric stranding, new since prior study. No hydronephrosis or evidence of stones. Recommend clinical correlation to completely exclude infection/pyelonephritis.  Cholelithiasis. Moderate stool burden with scattered colonic diverticula. Electronically Signed   By: Charlett Nose M.D.   On: 11/21/2022 21:04   CT ABDOMEN PELVIS WO CONTRAST  Result  Date: 11/21/2022 CLINICAL DATA:  Sepsis.  Fall.  Altered mental status. EXAM: CT CHEST, ABDOMEN AND PELVIS WITHOUT CONTRAST TECHNIQUE: Multidetector CT imaging of the chest, abdomen and pelvis was performed following the standard protocol without IV contrast. RADIATION DOSE REDUCTION: This exam was performed according to the departmental dose-optimization program which includes automated exposure control, adjustment of the mA and/or kV according to patient size and/or use of iterative reconstruction technique. COMPARISON:  None Available. FINDINGS: CT CHEST FINDINGS Cardiovascular: Heart insert upper limits normal in size. Aorta normal caliber. Scattered coronary artery and aortic atherosclerosis. Mediastinum/Nodes: No mediastinal, hilar, or axillary adenopathy. Trachea and esophagus are unremarkable. Thyroid unremarkable. Lungs/Pleura: Linear scarring or atelectasis in the lung bases. No effusions or pneumothorax. No suspicious pulmonary nodules. Musculoskeletal: Chest wall soft tissues are unremarkable. Multiple old healed bilateral rib fractures. Possible acute nondisplaced left lateral 5th rib fracture. Old healed right scapular fracture. CT ABDOMEN PELVIS FINDINGS Hepatobiliary: Small layering gallstones within the gallbladder. No focal hepatic abnormality or biliary ductal dilatation. Pancreas: No focal abnormality or ductal dilatation. Spleen: No focal abnormality.  Normal size. Adrenals/Urinary Tract: Normal adrenal glands. Large cyst off the upper pole of the right kidney measuring 6.3 cm, stable. Other smaller cysts. No follow-up imaging recommended. Mild right perinephric stranding. No hydronephrosis. Urinary bladder unremarkable with Foley catheter in place. Stomach/Bowel: Stomach, large and small bowel grossly  unremarkable. Moderate stool burden throughout the colon. Few scattered colonic diverticula. Normal appendix. Vascular/Lymphatic: Aortoiliac atherosclerosis. No evidence of aneurysm or adenopathy. Reproductive: No visible focal abnormality. Penile implant in place. Other: No free fluid or free air. Musculoskeletal: No acute bony abnormality. IMPRESSION: No acute cardiopulmonary disease. Bibasilar atelectasis or scarring. Right perinephric stranding, new since prior study. No hydronephrosis or evidence of stones. Recommend clinical correlation to completely exclude infection/pyelonephritis. Cholelithiasis. Moderate stool burden with scattered colonic diverticula. Electronically Signed   By: Charlett Nose M.D.   On: 11/21/2022 21:04   CT Head Wo Contrast  Result Date: 11/21/2022 CLINICAL DATA:  Head trauma, moderate-severe AMS, fall. EXAM: CT HEAD WITHOUT CONTRAST TECHNIQUE: Contiguous axial images were obtained from the base of the skull through the vertex without intravenous contrast. RADIATION DOSE REDUCTION: This exam was performed according to the departmental dose-optimization program which includes automated exposure control, adjustment of the mA and/or kV according to patient size and/or use of iterative reconstruction technique. COMPARISON:  03/30/2006 FINDINGS: Brain: There is atrophy and chronic small vessel disease changes. No acute intracranial abnormality. Specifically, no hemorrhage, hydrocephalus, mass lesion, acute infarction, or significant intracranial injury. Vascular: No hyperdense vessel or unexpected calcification. Skull: No acute calvarial abnormality. Sinuses/Orbits: No acute findings Other: None IMPRESSION: Atrophy, chronic microvascular disease. No acute intracranial abnormality. Electronically Signed   By: Charlett Nose M.D.   On: 11/21/2022 20:56   DG Chest Port 1 View  Result Date: 11/21/2022 CLINICAL DATA:  Dizziness.  Fall. EXAM: PORTABLE CHEST 1 VIEW COMPARISON:  09/27/2022  FINDINGS: Remote bilateral rib fractures. The left-sided fractures are new since the prior plain film. Midline trachea. Mild cardiomegaly. Tortuous thoracic aorta. No pleural effusion or pneumothorax. Clear lungs. No congestive failure. IMPRESSION: 1. No acute cardiopulmonary disease. 2. Cardiomegaly without congestive failure. Electronically Signed   By: Jeronimo Greaves M.D.   On: 11/21/2022 18:44     Medications:    cefTRIAXone (ROCEPHIN)  IV Stopped (11/22/22 1731)   dextrose 5 % and 0.9 % NaCl 60 mL/hr at 11/23/22 0812    buPROPion  150 mg Oral Daily   calcitRIOL  0.25 mcg Oral Daily   Chlorhexidine Gluconate Cloth  6 each Topical Q0600   cholecalciferol  1,000 Units Oral Daily   feeding supplement (NEPRO CARB STEADY)  237 mL Oral BID BM   lamoTRIgine  25 mg Oral [id number]   lithium carbonate  150 mg Oral Q supper   midodrine  5 mg Oral TID WC   multivitamin  1 tablet Per Tube QHS   OLANZapine  10 mg Oral QHS   pantoprazole (PROTONIX) IV  40 mg Intravenous Q12H   sodium chloride flush  3 mL Intravenous Q12H   acetaminophen **OR** acetaminophen  Assessment/ Plan:  77 y.o. male with bipolar disorder, recent admission for depression, end-stage renal disease, diabetes, hypertension, hyperlipidemia, gout, left retinal stroke, Crohn's disease, recurrent UTIs    admitted on 11/21/2022 for Fall, initial encounter [W19.XXXA] Fall at home, initial encounter [id number], Y92.009] Sepsis, due to unspecified organism, unspecified whether acute organ dysfunction present [facility]) [A41.9]  Outpatient dialysis: TTS/ Rt internal jugular PC  #End-stage renal disease on hemodialysis -Receiving dialysis today, UF 0 - Next treatment scheduled for Thursday.  #Anemia of chronic kidney disease  - Hemoglobin 8.0, below goal  - Continue low-dose Epogen with hemodialysis treatments.  #Sepsis   -Status post cystoscopy on 11/19/2022.  -Blood cultures are positive for Enterobacterales and E. Coli. Likely urinary  source as u/a is abnormal.   -He was given cefepime yesterday and is now on ceftriaxone 2 g daily.  -Supportive care for sepsis.    #Secondary hyperparathyroidism  - Calcium within desired range.     LOS: 2 Wendee Beavers 9/3/202411:40 AM  [address] [place], [place] [phone]

## 2022-11-23 NOTE — Assessment & Plan Note (Signed)
Patient with some voice fingertips especially ones with fingerstick.  Could be Raynaud's phenomenon.

## 2022-11-23 NOTE — Progress Notes (Signed)
Progress Note   Patient: Luis Miller. FAO:[id number] DOB: 05-23-1945 DOA: 11/21/2022     2 DOS: the patient was seen and examined on 11/23/2022   Brief hospital course: 77 year old man with history of end-stage renal disease on hemodialysis, anemia, bipolar disorder, sleep apnea, gout, hypertension coming in after a fall.  Patient found to have a fever.  Patient had a recent cystoscopy for hematuria.  9/2.  Positive for E. coli in blood cultures.  CT scan concerning for right pyelonephritis.  Antibiotics switched over to Rocephin.  On midodrine for hypotension. 9/3.  Patient feeling better today.  Asking for medications for constipation.  Assessment and Plan: * Severe sepsis (HCC) Present on admission, with pyelonephritis with E. coli growing in blood cultures, fever, hypotension, leukocytosis, a acute metabolic encephalopathy.  Antibiotics switched to Rocephin on 9/2.Marland Kitchen  Patient had recent cystoscopy.   Acute metabolic encephalopathy Patient more alert today.  Hypotension Taper midodrine down to 2.5 mg 3 times daily may be able to come off midodrine.  Discontinue fluids.  Hypoglycemia Yesterday.  Given an amp of D50 and D10 in the fluids.  Now since he is on a diet we will get rid of D10.  Rhabdomyolysis CK improved from 6869 down to 1161.  ESRD on hemodialysis [facility]) Appreciate nephrology consultation.  Raynaud's phenomenon Patient with some voice fingertips especially ones with fingerstick.  Could be Raynaud's phenomenon.  Constipation Dose of lactulose, Colace.  Anemia of chronic disease Hemoglobin 8.0 and ferritin up at 636.  Discontinue fluids.  Gross hematuria Had recent cystoscopy which did not show any etiology for hematuria.  Likely BPH.   Obstructive sleep apnea On CPAP at night        Subjective: Patient feeling a little bit better.  Fingertips bluish.  Asking for meds for constipation.  Still feels weak.  Still has a tremor.  Physical Exam: Vitals:    11/23/22 1200 11/23/22 1230 11/23/22 1241 11/23/22 1333  BP: (!) 126/99 (!) 142/82 (!) 147/80 (!) 152/80  Pulse: 72 75  85  Resp: (!) 24 18  19   Temp:   97.8 F (36.6 C)   TempSrc:   Oral   SpO2: 100% 100%  100%  Weight:      Height:       Physical Exam HENT:     Head: Normocephalic.  Eyes:     General: Lids are normal.     Conjunctiva/sclera: Conjunctivae normal.  Cardiovascular:     Rate and Rhythm: Normal rate and regular rhythm.     Heart sounds: Normal heart sounds, S1 normal and S2 normal.  Pulmonary:     Breath sounds: No decreased breath sounds, wheezing, rhonchi or rales.  Abdominal:     Palpations: Abdomen is soft.     Tenderness: There is no abdominal tenderness.  Musculoskeletal:     Right lower leg: No swelling.     Left lower leg: No swelling.  Skin:    Comments: Finger tips a little bluish.  Neurological:     Mental Status: He is alert.     Comments: Answers questions appropriately.  Still has a little bit of a twitch with his upper extremities.     Data Reviewed: Creatinine 7.93, CK 1161, BNP two 167.4, ferritin 636, hemoglobin 8.0  Family Communication: Updated son on the phone  Disposition: Status is: Inpatient Remains inpatient appropriate because: Transfer out of stepdown unit today  Planned Discharge Destination: To be determined    Time spent: 28 minutes  Author: Alford Highland, MD 11/23/2022 1:51 PM  For on call review [email or web address].

## 2022-11-23 NOTE — Progress Notes (Signed)
[facility] [place], [place] 11/23/22  Subjective:   Hospital day # 2  Patient is a 77 year old male with end-stage renal disease on hemodialysis, hypertension, gout, depression, history of melanoma, Crohn's disease, history of stroke, bipolar disorder.  He was recently hospitalized at [facility] behavioral health for severe bipolar 1 disorder with recurrent depression.  Discharged on 10/05/2022.  Patient had a cystoscopy on 11/19/2022.  No bladder mucosal lesions were seen.  Etiology of hematuria was thought to be most likely BPH.  Patient seen and evaluated during dialysis   HEMODIALYSIS FLOWSHEET:  Blood Flow Rate (mL/min): 400 mL/min Arterial Pressure (mmHg): -203.84 mmHg Venous Pressure (mmHg): 193.13 mmHg TMP (mmHg): 4.85 mmHg Ultrafiltration Rate (mL/min): 194 mL/min Dialysate Flow Rate (mL/min): 300 ml/min  Tolerating treatment well States he feels well, better than on admission   Objective:  Vital signs in last 24 hours:  Temp:  [97.8 F (36.6 C)-100.3 F (37.9 C)] 97.8 F (36.6 C) (09/03 1241) Pulse Rate:  [67-93] 85 (09/03 1333) Resp:  [14-31] 19 (09/03 1333) BP: (109-152)/(59-99) 152/80 (09/03 1333) SpO2:  [99 %-100 %] 100 % (09/03 1333) FiO2 (%):  [21 %] 21 % (09/03 0600) Weight:  [78.2 kg] 78.2 kg (09/03 0857)  Weight change: -2.187 kg Filed Weights   11/21/22 1700 11/22/22 1319 11/23/22 0857  Weight: 80.3 kg 78.1 kg 78.2 kg    Intake/Output:    Intake/Output Summary (Last 24 hours) at 11/23/2022 1407 Last data filed at 11/23/2022 1241 Gross per 24 hour  Intake 1336.72 ml  Output 1150 ml  Net 186.72 ml     Physical Exam: General: NAD, laying in the bed  HEENT Moist oral mucous membranes  Pulm/lungs Normal breathing effort, clear to auscultation  CVS/Heart Regular, no rub  Abdomen:  Soft, nontender, nondistended  Extremities: No peripheral edema  Neurologic: Alert, able to answer simple questions.  Generalized weakness.  Skin: No  acute rashes  Access: Right IJ dialysis catheter.       Basic Metabolic Panel:  Recent Labs  Lab 11/21/22 1710 11/23/22 0914  NA 138 138  K 3.6 3.5  CL 94* 103  CO2 28 23  GLUCOSE 117* 210*  BUN 41* 59*  CREATININE 6.33* 7.93*  CALCIUM 9.2 8.4*     CBC: Recent Labs  Lab 11/21/22 1710 11/23/22 0914  WBC 11.0* 7.2  NEUTROABS 9.9*  --   HGB 9.4* 8.0*  HCT 28.5* 23.9*  MCV 104.8* 103.9*  PLT 110* 94*      Lab Results  Component Value Date   HEPBSAG NON REACTIVE 11/22/2022   HEPBSAB NON REACTIVE 08/02/2019      Microbiology:  Recent Results (from the past 240 hour(s))  Blood Culture (routine x 2)     Status: Abnormal (Preliminary result)   Collection Time: 11/21/22  5:11 PM   Specimen: BLOOD  Result Value Ref Range Status   Specimen Description   Final    BLOOD RIGHT ANTECUBITAL Performed at [facility], [address] [place], [place] [address]    Special Requests   Final    BOTTLES DRAWN AEROBIC AND ANAEROBIC Blood Culture adequate volume Performed at [facility], [address] [place], [place] [address]    Culture  Setup Time   Final    GRAM NEGATIVE RODS IN BOTH AEROBIC AND ANAEROBIC BOTTLES CRITICAL RESULT CALLED TO, READ BACK BY AND VERIFIED WITH: NATHAN BELUE 0617 11/22/22 SLM    Culture (A)  Final    ESCHERICHIA COLI  SUSCEPTIBILITIES TO FOLLOW Performed at [facility], [address], [place], [place] [address]    Report Status PENDING  Incomplete  Blood Culture (routine x 2)     Status: Abnormal (Preliminary result)   Collection Time: 11/21/22  5:11 PM   Specimen: BLOOD  Result Value Ref Range Status   Specimen Description   Final    BLOOD BLOOD RIGHT FOREARM Performed at [facility], [address], [place], [place] [address]    Special Requests   Final    BOTTLES DRAWN AEROBIC AND ANAEROBIC Blood Culture adequate volume Performed at [facility], [address], [place],  [place] [address]    Culture  Setup Time   Final    GRAM NEGATIVE RODS IN BOTH AEROBIC AND ANAEROBIC BOTTLES CRITICAL VALUE NOTED.  VALUE IS CONSISTENT WITH PREVIOUSLY REPORTED AND CALLED VALUE. Performed at [facility], [address] [place], [place] [address]    Culture ESCHERICHIA COLI (A)  Final   Report Status PENDING  Incomplete  Blood Culture ID Panel (Reflexed)     Status: Abnormal   Collection Time: 11/21/22  5:11 PM  Result Value Ref Range Status   Enterococcus faecalis NOT DETECTED NOT DETECTED Final   Enterococcus Faecium NOT DETECTED NOT DETECTED Final   Listeria monocytogenes NOT DETECTED NOT DETECTED Final   Staphylococcus species NOT DETECTED NOT DETECTED Final   Staphylococcus aureus (BCID) NOT DETECTED NOT DETECTED Final   Staphylococcus epidermidis NOT DETECTED NOT DETECTED Final   Staphylococcus lugdunensis NOT DETECTED NOT DETECTED Final   Streptococcus species NOT DETECTED NOT DETECTED Final   Streptococcus agalactiae NOT DETECTED NOT DETECTED Final   Streptococcus pneumoniae NOT DETECTED NOT DETECTED Final   Streptococcus pyogenes NOT DETECTED NOT DETECTED Final   A.calcoaceticus-baumannii NOT DETECTED NOT DETECTED Final   Bacteroides fragilis NOT DETECTED NOT DETECTED Final   Enterobacterales DETECTED (A) NOT DETECTED Final    Comment: Enterobacterales represent a large order of gram negative bacteria, not a single organism. CRITICAL RESULT CALLED TO, READ BACK BY AND VERIFIED WITH: NATHAN BELUE 0617 11/22/22 SLM    Enterobacter cloacae complex NOT DETECTED NOT DETECTED Final   Escherichia coli DETECTED (A) NOT DETECTED Final    Comment: CRITICAL RESULT CALLED TO, READ BACK BY AND VERIFIED WITH: NATHAN BELUE 0617 11/22/22 SLM    Klebsiella aerogenes NOT DETECTED NOT DETECTED Final   Klebsiella oxytoca NOT DETECTED NOT DETECTED Final   Klebsiella pneumoniae NOT DETECTED NOT DETECTED Final   Proteus species NOT DETECTED NOT DETECTED Final   Salmonella  species NOT DETECTED NOT DETECTED Final   Serratia marcescens NOT DETECTED NOT DETECTED Final   Haemophilus influenzae NOT DETECTED NOT DETECTED Final   Neisseria meningitidis NOT DETECTED NOT DETECTED Final   Pseudomonas aeruginosa NOT DETECTED NOT DETECTED Final   Stenotrophomonas maltophilia NOT DETECTED NOT DETECTED Final   Candida albicans NOT DETECTED NOT DETECTED Final   Candida auris NOT DETECTED NOT DETECTED Final   Candida glabrata NOT DETECTED NOT DETECTED Final   Candida krusei NOT DETECTED NOT DETECTED Final   Candida parapsilosis NOT DETECTED NOT DETECTED Final   Candida tropicalis NOT DETECTED NOT DETECTED Final   Cryptococcus neoformans/gattii NOT DETECTED NOT DETECTED Final   CTX-M ESBL NOT DETECTED NOT DETECTED Final   Carbapenem resistance IMP NOT DETECTED NOT DETECTED Final   Carbapenem resistance KPC NOT DETECTED NOT DETECTED Final   Carbapenem resistance NDM NOT DETECTED NOT DETECTED Final   Carbapenem resist OXA 48  LIKE NOT DETECTED NOT DETECTED Final   Carbapenem resistance VIM NOT DETECTED NOT DETECTED Final    Comment: Performed at [facility], [address] [place], [place] [address]  Resp panel by RT-PCR (RSV, Flu A&B, Covid) Anterior Nasal Swab     Status: None   Collection Time: 11/21/22  6:10 PM   Specimen: Anterior Nasal Swab  Result Value Ref Range Status   SARS Coronavirus 2 by RT PCR NEGATIVE NEGATIVE Final    Comment: (NOTE) SARS-CoV-2 target nucleic acids are NOT DETECTED.  The SARS-CoV-2 RNA is generally detectable in upper respiratory specimens during the acute phase of infection. The lowest concentration of SARS-CoV-2 viral copies this assay can detect is 138 copies/mL. A negative result does not preclude SARS-Cov-2 infection and should not be used as the sole basis for treatment or other patient management decisions. A negative result may occur with  improper specimen collection/handling, submission of specimen other than  nasopharyngeal swab, presence of viral mutation(s) within the areas targeted by this assay, and inadequate number of viral copies(<138 copies/mL). A negative result must be combined with clinical observations, patient history, and epidemiological information. The expected result is Negative.  Fact Sheet for Patients:  BloggerCourse.com  Fact Sheet for Healthcare Providers:  SeriousBroker.it  This test is no t yet approved or cleared by the [place] FDA and  has been authorized for detection and/or diagnosis of SARS-CoV-2 by FDA under an Emergency Use Authorization (EUA). This EUA will remain  in effect (meaning this test can be used) for the duration of the COVID-19 declaration under Section 564(b)(1) of the Act, 21 U.S.C.section 360bbb-3(b)(1), unless the authorization is terminated  or revoked sooner.       Influenza A by PCR NEGATIVE NEGATIVE Final   Influenza B by PCR NEGATIVE NEGATIVE Final    Comment: (NOTE) The Xpert Xpress SARS-CoV-2/FLU/RSV plus assay is intended as an aid in the diagnosis of influenza from Nasopharyngeal swab specimens and should not be used as a sole basis for treatment. Nasal washings and aspirates are unacceptable for Xpert Xpress SARS-CoV-2/FLU/RSV testing.  Fact Sheet for Patients: BloggerCourse.com  Fact Sheet for Healthcare Providers: SeriousBroker.it  This test is not yet approved or cleared by the [place] FDA and has been authorized for detection and/or diagnosis of SARS-CoV-2 by FDA under an Emergency Use Authorization (EUA). This EUA will remain in effect (meaning this test can be used) for the duration of the COVID-19 declaration under Section 564(b)(1) of the Act, 21 U.S.C. section 360bbb-3(b)(1), unless the authorization is terminated or revoked.     Resp Syncytial Virus by PCR NEGATIVE NEGATIVE Final    Comment:  (NOTE) Fact Sheet for Patients: BloggerCourse.com  Fact Sheet for Healthcare Providers: SeriousBroker.it  This test is not yet approved or cleared by the [place] FDA and has been authorized for detection and/or diagnosis of SARS-CoV-2 by FDA under an Emergency Use Authorization (EUA). This EUA will remain in effect (meaning this test can be used) for the duration of the COVID-19 declaration under Section 564(b)(1) of the Act, 21 U.S.C. section 360bbb-3(b)(1), unless the authorization is terminated or revoked.  Performed at [facility], [address] [place], [place] [address]   MRSA Next Gen by PCR, Nasal     Status: None   Collection Time: 11/22/22  1:27 PM   Specimen: Nasal Mucosa; Nasal Swab  Result Value Ref Range Status   MRSA by PCR Next Gen NOT DETECTED NOT DETECTED Final  Comment: (NOTE) The GeneXpert MRSA Assay (FDA approved for NASAL specimens only), is one component of a comprehensive MRSA colonization surveillance program. It is not intended to diagnose MRSA infection nor to guide or monitor treatment for MRSA infections. Test performance is not FDA approved in patients less than 44 years old. Performed at [facility], [address] [place], [place] [address]     Coagulation Studies: Recent Labs    11/21/22 1711  LABPROT 16.5*  INR 1.3*    Urinalysis: Recent Labs    11/21/22 2000  COLORURINE YELLOW*  LABSPEC 1.020  PHURINE 8.5*  GLUCOSEU NEGATIVE  HGBUR LARGE*  BILIRUBINUR NEGATIVE  KETONESUR NEGATIVE  PROTEINUR >300*  NITRITE NEGATIVE  LEUKOCYTESUR TRACE*      Imaging: ECHOCARDIOGRAM COMPLETE  Result Date: 11/22/2022    ECHOCARDIOGRAM REPORT   Patient Name:   Lott Baladez. Date of Exam: 11/22/2022 Medical Rec #:  [id number]          Height:       66.0 in Accession #:    [id number]         Weight:       177.0 lb Date of Birth:  May 27, 1945          BSA:           1.899 m Patient Age:    32 years           BP:           80/46 mmHg Patient Gender: M                  HR:           85 bpm. Exam Location:  [facility] Procedure: 2D Echo, Cardiac Doppler and Color Doppler Indications:    Syncope R55; Hypotension  History:        Patient has prior history of Echocardiogram examinations, most                 recent 08/02/2019. Signs/Symptoms:Syncope; Risk                 Factors:Hypertension, Dyslipidemia, Sleep Apnea and Former                 Smoker.  Sonographer:    Dondra Prader RVT RCS Referring Phys: [phone] EKTA V PATEL IMPRESSIONS  1. Left ventricular ejection fraction, by estimation, is 60 to 65%. The left ventricle has normal function. The left ventricle has no regional wall motion abnormalities. Left ventricular diastolic parameters are consistent with Grade I diastolic dysfunction (impaired relaxation).  2. Right ventricular systolic function is normal. The right ventricular size is normal. There is normal pulmonary artery systolic pressure. The estimated right ventricular systolic pressure is 29.9 mmHg.  3. Right atrial size was severely dilated.  4. The mitral valve is normal in structure. No evidence of mitral valve regurgitation. No evidence of mitral stenosis.  5. The aortic valve is tricuspid. Aortic valve regurgitation is not visualized. Aortic valve sclerosis/calcification is present, without any evidence of aortic stenosis. Aortic valve area, by VTI measures 3.76 cm. Aortic valve mean gradient measures 6.0 mmHg. Aortic valve Vmax measures 1.60 m/s.  6. Aortic dilatation noted. There is mild dilatation of the ascending aorta, measuring 38 mm.  7. The inferior vena cava is dilated in size with <50% respiratory variability, suggesting right atrial pressure of 15 mmHg. FINDINGS  Left Ventricle: Left ventricular ejection fraction, by estimation, is 60 to 65%. The left ventricle has  normal function. The left ventricle has no regional wall motion abnormalities. The left  ventricular internal cavity size was normal in size. There is  no left ventricular hypertrophy. Left ventricular diastolic parameters are consistent with Grade I diastolic dysfunction (impaired relaxation). Normal left ventricular filling pressure. Right Ventricle: The right ventricular size is normal. No increase in right ventricular wall thickness. Right ventricular systolic function is normal. There is normal pulmonary artery systolic pressure. The tricuspid regurgitant velocity is 1.93 m/s, and  with an assumed right atrial pressure of 15 mmHg, the estimated right ventricular systolic pressure is 29.9 mmHg. Left Atrium: Left atrial size was normal in size. Right Atrium: Right atrial size was severely dilated. Pericardium: There is no evidence of pericardial effusion. Mitral Valve: The mitral valve is normal in structure. No evidence of mitral valve regurgitation. No evidence of mitral valve stenosis. Tricuspid Valve: The tricuspid valve is normal in structure. Tricuspid valve regurgitation is not demonstrated. No evidence of tricuspid stenosis. Aortic Valve: The aortic valve is tricuspid. Aortic valve regurgitation is not visualized. Aortic valve sclerosis/calcification is present, without any evidence of aortic stenosis. Aortic valve mean gradient measures 6.0 mmHg. Aortic valve peak gradient measures 10.2 mmHg. Aortic valve area, by VTI measures 3.76 cm. Pulmonic Valve: The pulmonic valve was normal in structure. Pulmonic valve regurgitation is not visualized. No evidence of pulmonic stenosis. Aorta: Aortic dilatation noted. There is mild dilatation of the ascending aorta, measuring 38 mm. Venous: The inferior vena cava is dilated in size with less than 50% respiratory variability, suggesting right atrial pressure of 15 mmHg. IAS/Shunts: No atrial level shunt detected by color flow Doppler.  LEFT VENTRICLE PLAX 2D LVIDd:         4.20 cm   Diastology LVIDs:         3.10 cm   LV e' medial:    7.83 cm/s LV PW:          1.20 cm   LV E/e' medial:  10.5 LV IVS:        1.00 cm   LV e' lateral:   11.40 cm/s LVOT diam:     2.20 cm   LV E/e' lateral: 7.2 LV SV:         105 LV SV Index:   55 LVOT Area:     3.80 cm  RIGHT VENTRICLE             IVC RV S prime:     17.20 cm/s  IVC diam: 2.20 cm TAPSE (M-mode): 2.8 cm LEFT ATRIUM             Index        RIGHT ATRIUM           Index LA diam:        3.50 cm 1.84 cm/m   RA Area:     23.70 cm LA Vol (A2C):   57.6 ml 30.34 ml/m  RA Volume:   86.80 ml  45.72 ml/m LA Vol (A4C):   54.3 ml 28.60 ml/m LA Biplane Vol: 57.0 ml 30.02 ml/m  AORTIC VALVE                     PULMONIC VALVE AV Area (Vmax):    3.87 cm      PV Vmax:       1.01 m/s AV Area (Vmean):   3.66 cm      PV Peak grad:  4.1 mmHg AV Area (VTI):  3.76 cm AV Vmax:           160.00 cm/s AV Vmean:          108.000 cm/s AV VTI:            0.279 m AV Peak Grad:      10.2 mmHg AV Mean Grad:      6.0 mmHg LVOT Vmax:         163.00 cm/s LVOT Vmean:        104.000 cm/s LVOT VTI:          0.276 m LVOT/AV VTI ratio: 0.99  AORTA Ao Root diam: 3.10 cm Ao Asc diam:  3.80 cm MITRAL VALVE                TRICUSPID VALVE MV Area (PHT): 3.68 cm     TR Peak grad:   14.9 mmHg MV Decel Time: 206 msec     TR Vmax:        193.00 cm/s MV E velocity: 82.50 cm/s MV A velocity: 109.00 cm/s  SHUNTS MV E/A ratio:  0.76         Systemic VTI:  0.28 m                             Systemic Diam: 2.20 cm Armanda Magic MD Electronically signed by Armanda Magic MD Signature Date/Time: 11/22/2022/2:43:46 PM    Final    CT Chest Wo Contrast  Result Date: 11/21/2022 CLINICAL DATA:  Sepsis.  Fall.  Altered mental status. EXAM: CT CHEST, ABDOMEN AND PELVIS WITHOUT CONTRAST TECHNIQUE: Multidetector CT imaging of the chest, abdomen and pelvis was performed following the standard protocol without IV contrast. RADIATION DOSE REDUCTION: This exam was performed according to the departmental dose-optimization program which includes automated exposure control,  adjustment of the mA and/or kV according to patient size and/or use of iterative reconstruction technique. COMPARISON:  None Available. FINDINGS: CT CHEST FINDINGS Cardiovascular: Heart insert upper limits normal in size. Aorta normal caliber. Scattered coronary artery and aortic atherosclerosis. Mediastinum/Nodes: No mediastinal, hilar, or axillary adenopathy. Trachea and esophagus are unremarkable. Thyroid unremarkable. Lungs/Pleura: Linear scarring or atelectasis in the lung bases. No effusions or pneumothorax. No suspicious pulmonary nodules. Musculoskeletal: Chest wall soft tissues are unremarkable. Multiple old healed bilateral rib fractures. Possible acute nondisplaced left lateral 5th rib fracture. Old healed right scapular fracture. CT ABDOMEN PELVIS FINDINGS Hepatobiliary: Small layering gallstones within the gallbladder. No focal hepatic abnormality or biliary ductal dilatation. Pancreas: No focal abnormality or ductal dilatation. Spleen: No focal abnormality.  Normal size. Adrenals/Urinary Tract: Normal adrenal glands. Large cyst off the upper pole of the right kidney measuring 6.3 cm, stable. Other smaller cysts. No follow-up imaging recommended. Mild right perinephric stranding. No hydronephrosis. Urinary bladder unremarkable with Foley catheter in place. Stomach/Bowel: Stomach, large and small bowel grossly unremarkable. Moderate stool burden throughout the colon. Few scattered colonic diverticula. Normal appendix. Vascular/Lymphatic: Aortoiliac atherosclerosis. No evidence of aneurysm or adenopathy. Reproductive: No visible focal abnormality. Penile implant in place. Other: No free fluid or free air. Musculoskeletal: No acute bony abnormality. IMPRESSION: No acute cardiopulmonary disease. Bibasilar atelectasis or scarring. Right perinephric stranding, new since prior study. No hydronephrosis or evidence of stones. Recommend clinical correlation to completely exclude infection/pyelonephritis.  Cholelithiasis. Moderate stool burden with scattered colonic diverticula. Electronically Signed   By: Charlett Nose M.D.   On: 11/21/2022 21:04   CT ABDOMEN PELVIS WO CONTRAST  Result  Date: 11/21/2022 CLINICAL DATA:  Sepsis.  Fall.  Altered mental status. EXAM: CT CHEST, ABDOMEN AND PELVIS WITHOUT CONTRAST TECHNIQUE: Multidetector CT imaging of the chest, abdomen and pelvis was performed following the standard protocol without IV contrast. RADIATION DOSE REDUCTION: This exam was performed according to the departmental dose-optimization program which includes automated exposure control, adjustment of the mA and/or kV according to patient size and/or use of iterative reconstruction technique. COMPARISON:  None Available. FINDINGS: CT CHEST FINDINGS Cardiovascular: Heart insert upper limits normal in size. Aorta normal caliber. Scattered coronary artery and aortic atherosclerosis. Mediastinum/Nodes: No mediastinal, hilar, or axillary adenopathy. Trachea and esophagus are unremarkable. Thyroid unremarkable. Lungs/Pleura: Linear scarring or atelectasis in the lung bases. No effusions or pneumothorax. No suspicious pulmonary nodules. Musculoskeletal: Chest wall soft tissues are unremarkable. Multiple old healed bilateral rib fractures. Possible acute nondisplaced left lateral 5th rib fracture. Old healed right scapular fracture. CT ABDOMEN PELVIS FINDINGS Hepatobiliary: Small layering gallstones within the gallbladder. No focal hepatic abnormality or biliary ductal dilatation. Pancreas: No focal abnormality or ductal dilatation. Spleen: No focal abnormality.  Normal size. Adrenals/Urinary Tract: Normal adrenal glands. Large cyst off the upper pole of the right kidney measuring 6.3 cm, stable. Other smaller cysts. No follow-up imaging recommended. Mild right perinephric stranding. No hydronephrosis. Urinary bladder unremarkable with Foley catheter in place. Stomach/Bowel: Stomach, large and small bowel grossly  unremarkable. Moderate stool burden throughout the colon. Few scattered colonic diverticula. Normal appendix. Vascular/Lymphatic: Aortoiliac atherosclerosis. No evidence of aneurysm or adenopathy. Reproductive: No visible focal abnormality. Penile implant in place. Other: No free fluid or free air. Musculoskeletal: No acute bony abnormality. IMPRESSION: No acute cardiopulmonary disease. Bibasilar atelectasis or scarring. Right perinephric stranding, new since prior study. No hydronephrosis or evidence of stones. Recommend clinical correlation to completely exclude infection/pyelonephritis. Cholelithiasis. Moderate stool burden with scattered colonic diverticula. Electronically Signed   By: Charlett Nose M.D.   On: 11/21/2022 21:04   CT Head Wo Contrast  Result Date: 11/21/2022 CLINICAL DATA:  Head trauma, moderate-severe AMS, fall. EXAM: CT HEAD WITHOUT CONTRAST TECHNIQUE: Contiguous axial images were obtained from the base of the skull through the vertex without intravenous contrast. RADIATION DOSE REDUCTION: This exam was performed according to the departmental dose-optimization program which includes automated exposure control, adjustment of the mA and/or kV according to patient size and/or use of iterative reconstruction technique. COMPARISON:  03/30/2006 FINDINGS: Brain: There is atrophy and chronic small vessel disease changes. No acute intracranial abnormality. Specifically, no hemorrhage, hydrocephalus, mass lesion, acute infarction, or significant intracranial injury. Vascular: No hyperdense vessel or unexpected calcification. Skull: No acute calvarial abnormality. Sinuses/Orbits: No acute findings Other: None IMPRESSION: Atrophy, chronic microvascular disease. No acute intracranial abnormality. Electronically Signed   By: Charlett Nose M.D.   On: 11/21/2022 20:56   DG Chest Port 1 View  Result Date: 11/21/2022 CLINICAL DATA:  Dizziness.  Fall. EXAM: PORTABLE CHEST 1 VIEW COMPARISON:  09/27/2022  FINDINGS: Remote bilateral rib fractures. The left-sided fractures are new since the prior plain film. Midline trachea. Mild cardiomegaly. Tortuous thoracic aorta. No pleural effusion or pneumothorax. Clear lungs. No congestive failure. IMPRESSION: 1. No acute cardiopulmonary disease. 2. Cardiomegaly without congestive failure. Electronically Signed   By: Jeronimo Greaves M.D.   On: 11/21/2022 18:44     Medications:    anticoagulant sodium citrate     cefTRIAXone (ROCEPHIN)  IV Stopped (11/22/22 1731)    buPROPion  150 mg Oral Daily   calcitRIOL  0.25 mcg Oral Daily  Chlorhexidine Gluconate Cloth  6 each Topical Q0600   cholecalciferol  1,000 Units Oral Daily   docusate sodium  100 mg Oral BID   feeding supplement (NEPRO CARB STEADY)  237 mL Oral BID BM   lactulose  30 g Oral Once   lamoTRIgine  25 mg Oral [id number]   lithium carbonate  150 mg Oral Q supper   midodrine  2.5 mg Oral TID WC   multivitamin  1 tablet Per Tube QHS   OLANZapine  10 mg Oral QHS   sodium chloride flush  3 mL Intravenous Q12H   acetaminophen **OR** acetaminophen, alteplase, anticoagulant sodium citrate, feeding supplement (NEPRO CARB STEADY), heparin, lidocaine (PF), lidocaine-prilocaine, pentafluoroprop-tetrafluoroeth  Assessment/ Plan:  77 y.o. male with bipolar disorder, recent admission for depression, end-stage renal disease, diabetes, hypertension, hyperlipidemia, gout, left retinal stroke, Crohn's disease, recurrent UTIs    admitted on 11/21/2022 for Fall, initial encounter [W19.XXXA] Fall at home, initial encounter [phone], Y92.009] Sepsis, due to unspecified organism, unspecified whether acute organ dysfunction present [facility]) [A41.9]  Outpatient dialysis: TTS/ Rt internal jugular PC  #End-stage renal disease on hemodialysis -Receiving dialysis today, UF 0 - Next treatment scheduled for Thursday.  #Anemia of chronic kidney disease  - Hemoglobin 8.0, below goal  - Continue low-dose Epogen with  hemodialysis treatments.  #Sepsis   -Status post cystoscopy on 11/19/2022.  -Blood cultures are positive for Enterobacterales and E. Coli. Likely urinary source as u/a is abnormal.   -He was given cefepime yesterday and is now on ceftriaxone 2 g daily.  -Supportive care for sepsis.    #Secondary hyperparathyroidism  - Calcium within desired range.   # Constipation, reports no bowel movement for a few days. Will order Lactulose 30g one time.   # Hypoglycemia, glipizide outpatient, now held. Will change IVF to D10 at 85ml/hr.    LOS: 2 Wendee Beavers 9/3/20242:07 PM  [facility] [address] [place], [place] [phone]
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
## 2022-11-24 ENCOUNTER — Ambulatory Visit: Payer: Medicare Other | Admitting: Physician Assistant

## 2022-11-24 DIAGNOSIS — R6520 Severe sepsis without septic shock: Secondary | ICD-10-CM | POA: Diagnosis not present

## 2022-11-24 DIAGNOSIS — N186 End stage renal disease: Secondary | ICD-10-CM | POA: Diagnosis not present

## 2022-11-24 DIAGNOSIS — E663 Overweight: Secondary | ICD-10-CM | POA: Insufficient documentation

## 2022-11-24 DIAGNOSIS — A419 Sepsis, unspecified organism: Secondary | ICD-10-CM | POA: Diagnosis not present

## 2022-11-24 DIAGNOSIS — G9341 Metabolic encephalopathy: Secondary | ICD-10-CM | POA: Diagnosis not present

## 2022-11-24 LAB — BASIC METABOLIC PANEL
Anion gap: 8 (ref 5–15)
BUN: 39 mg/dL — ABNORMAL HIGH (ref 8–23)
CO2: 28 mmol/L (ref 22–32)
Calcium: 8.3 mg/dL — ABNORMAL LOW (ref 8.9–10.3)
Chloride: 100 mmol/L (ref 98–111)
Creatinine, Ser: 4.92 mg/dL — ABNORMAL HIGH (ref 0.61–1.24)
GFR, Estimated: 11 mL/min — ABNORMAL LOW (ref >60)
Glucose, Bld: 129 mg/dL — ABNORMAL HIGH (ref 70–99)
Potassium: 3.6 mmol/L (ref 3.5–5.1)
Sodium: 136 mmol/L (ref 135–145)

## 2022-11-24 LAB — CBC
HCT: 22.3 % — ABNORMAL LOW (ref 39.0–52.0)
Hemoglobin: 7.6 g/dL — ABNORMAL LOW (ref 13.0–17.0)
MCH: 34.4 pg — ABNORMAL HIGH (ref 26.0–34.0)
MCHC: 34.1 g/dL (ref 30.0–36.0)
MCV: 100.9 fL — ABNORMAL HIGH (ref 80.0–100.0)
Platelets: 104 10*3/uL — ABNORMAL LOW (ref 150–400)
RBC: 2.21 MIL/uL — ABNORMAL LOW (ref 4.22–5.81)
RDW: 13.2 % (ref 11.5–15.5)
WBC: 5.7 10*3/uL (ref 4.0–10.5)
nRBC: 0 % (ref 0.0–0.2)

## 2022-11-24 LAB — GLUCOSE, CAPILLARY
Glucose-Capillary: 89 mg/dL (ref 70–99)
Glucose-Capillary: 118 mg/dL — ABNORMAL HIGH (ref 70–99)
Glucose-Capillary: 149 mg/dL — ABNORMAL HIGH (ref 70–99)
Glucose-Capillary: 188 mg/dL — ABNORMAL HIGH (ref 70–99)

## 2022-11-24 LAB — CULTURE, BLOOD (ROUTINE X 2)
Special Requests: ADEQUATE
Special Requests: ADEQUATE

## 2022-11-24 LAB — PSA (REFLEX TO FREE) (SERIAL): Prostate Specific Ag, Serum: 13.5 ng/mL — ABNORMAL HIGH (ref 0.0–4.0)

## 2022-11-24 MED ORDER — POLYETHYLENE GLYCOL 3350 17 G PO PACK
17.0000 g | PACK | Freq: Every day | ORAL | Status: DC
  Administered 2022-11-24 – 2022-11-26 (×2): 17 g via ORAL
  Filled 2022-11-24 (×4): qty 1

## 2022-11-24 MED ORDER — TRAZODONE HCL 50 MG PO TABS
50.0000 mg | ORAL_TABLET | Freq: Every evening | ORAL | Status: DC | PRN
  Administered 2022-11-24 – 2022-11-25 (×2): 50 mg via ORAL
  Filled 2022-11-24 (×2): qty 1

## 2022-11-24 NOTE — Progress Notes (Addendum)
0800 Patient awake and eating breakfast. No complaints. 1000 PT here but patient asleep. 1100 Working with OT 1230 Walked 210 feet with PT with minmal assist. 1413 Report called to [employer] on 2C. 1430 Transferrwed to room 215 via wheelchair.

## 2022-11-24 NOTE — Discharge Planning (Signed)
ESTABLISHED HEMODIALYSIS Outpatient Facility [facility]  [address] [place], [place] [address] [phone]  Schedule: TTS 10:30am  Confirmed above schedule with Amber, AA. Patient is active and can resume schedule upon discharge.  Dimas Chyle Dialysis Coordinator II  [facility] Cell: [phone] eFax: [phone] [email or web address]

## 2022-11-24 NOTE — Evaluation (Signed)
Occupational Therapy Evaluation Patient Details Name: Archit Knoth. MRN: [id number] DOB: 1945/12/12 Today's Date: 11/24/2022   History of Present Illness Pt is a 77y/o male admitted due to a fall and complaints of weakness. Per ED note, he felt dizzy/fell when getting out of bed and was on the ground for 3-4 hours before EMS arrived. Workup for and sepsis secondary to pyelonephritis. transferred to ICU due to BS and lethargy. PMH includes HTN, gout, stroke, prediabetes, chron's disease, IBS, hyperthyroidism, recurrent UTIs, ESRD, and anemia.   Clinical Impression   Patient received for OT evaluation. See flowsheet below for details of function. Generally, patient requiring MOD A for bed mobility, CGA-MIN A for functional mobility at RW (sidesteps at EOB only), and set up-MIN A for ADLs. Patient will benefit from continued OT while in acute care.      If plan is discharge home, recommend the following: A lot of help with walking and/or transfers;A little help with bathing/dressing/bathroom;Assistance with cooking/housework;Direct supervision/assist for medications management;Direct supervision/assist for financial management;Assist for transportation;Help with stairs or ramp for entrance    Functional Status Assessment  Patient has had a recent decline in their functional status and demonstrates the ability to make significant improvements in function in a reasonable and predictable amount of time.  Equipment Recommendations  Other (comment) (defer)    Recommendations for Other Services       Precautions / Restrictions Precautions Precautions: Fall Restrictions Weight Bearing Restrictions: No      Mobility Bed Mobility Overal bed mobility: Needs Assistance Bed Mobility: Sit to Supine, Supine to Sit     Supine to sit: Mod assist, HOB elevated (assist for trunk elevation) Sit to supine: Supervision   General bed mobility comments: Pt able to t/f self back into bed without  assist today    Transfers Overall transfer level: Needs assistance Equipment used: Rolling walker (2 wheels) Transfers: Sit to/from Stand Sit to Stand: Contact guard assist, Min assist           General transfer comment: Posterior lean in standing; very pronounced during t/f; improved upon standing, but likely still leaning back on the bed.      Balance Overall balance assessment: Needs assistance Sitting-balance support: Feet supported Sitting balance-Leahy Scale: Good     Standing balance support: Bilateral upper extremity supported, Reliant on assistive device for balance Standing balance-Leahy Scale: Poor                             ADL either performed or assessed with clinical judgement   ADL Overall ADL's : Needs assistance/impaired   Eating/Feeding Details (indicate cue type and reason): anticipate set up Grooming: Wash/dry face;Sitting;Set up Grooming Details (indicate cue type and reason): seated EOB   Upper Body Bathing Details (indicate cue type and reason): anticipate set up from seated   Lower Body Bathing Details (indicate cue type and reason): anticipate set up from seated   Upper Body Dressing Details (indicate cue type and reason): anticipate set up from seated   Lower Body Dressing Details (indicate cue type and reason): anticipate CGA-MIN A (especially for balance while standing to pull up); pt was able to make figure four position today from seated   Toilet Transfer Details (indicate cue type and reason): anticipate CGA-MIN A   Toileting - Clothing Manipulation Details (indicate cue type and reason): anticipate MIN A for balance   Tub/Shower Transfer Details (indicate cue type and reason): not tested  Functional mobility during ADLs: Contact guard assist;Minimal assistance;Rolling walker (2 wheels) (only sidestep to the L at EOB today for safety; slight posterior lean) General ADL Comments: Pt with decreased activity tolerance. Tolerated  sitting EOB x15 minutes today. O2 sats 100% on room air.     Vision Baseline Vision/History: 1 Wears glasses Patient Visual Report: No change from baseline       Perception         Praxis         Pertinent Vitals/Pain Pain Assessment Pain Assessment: 0-10 Pain Score:  (unrated; mild) Pain Location: neck and lower back; chronic. Pt with L lateral flexion of neck; states this is chronic. Pain Descriptors / Indicators: Discomfort Pain Intervention(s): Monitored during session, Limited activity within patient's tolerance     Extremity/Trunk Assessment Upper Extremity Assessment Upper Extremity Assessment: Generalized weakness (Pt able to demonstrate WNL ROM today; states that his arms are feeling better)   Lower Extremity Assessment Lower Extremity Assessment: Generalized weakness   Cervical / Trunk Assessment Cervical / Trunk Assessment: Other exceptions Cervical / Trunk Exceptions: L neck flexion; pt states this is baseline. Slight L lean in sitting   Communication Communication Communication: No apparent difficulties   Cognition Arousal: Alert Behavior During Therapy: WFL for tasks assessed/performed Overall Cognitive Status: Within Functional Limits for tasks assessed                                 General Comments: A+Ox4. Cooperative     General Comments  Participating well    Exercises     Shoulder Instructions      Home Living Family/patient expects to be discharged to:: Private residence Living Arrangements: Alone Available Help at Discharge: Family;Available PRN/intermittently (son and ex-wife) Type of Home: House Home Access: Level entry     Home Layout: One level     Bathroom Shower/Tub: Chief Strategy Officer: Handicapped height     Home Equipment: Agricultural consultant (2 wheels);Cane - single point          Prior Functioning/Environment Prior Level of Function : Independent/Modified Independent              Mobility Comments: No AD use at baseline. Pt states he has had two falls in the past 10 days. Pt states "I always fall backwards." ADLs Comments: (I) ADLs. [place]no highway[place] to grocery store. Ex-wife often brings by food as well.        OT Problem List: Decreased strength;Decreased activity tolerance;Impaired balance (sitting and/or standing)      OT Treatment/Interventions: Self-care/ADL training;Therapeutic exercise;Therapeutic activities;Patient/family education    OT Goals(Current goals can be found in the care plan section) Acute Rehab OT Goals Patient Stated Goal: Get better OT Goal Formulation: With patient Time For Goal Achievement: 12/08/22 Potential to Achieve Goals: Good ADL Goals Pt Will Perform Grooming: with modified independence;standing Pt Will Perform Lower Body Bathing: with modified independence;sit to/from stand Pt Will Perform Lower Body Dressing: with modified independence;sit to/from stand Pt Will Transfer to Toilet: with modified independence;ambulating;regular height toilet Pt Will Perform Toileting - Clothing Manipulation and hygiene: with modified independence;sit to/from stand  OT Frequency: Min 1X/week    Co-evaluation              AM-PAC OT "6 Clicks" Daily Activity     Outcome Measure Help from another person eating meals?: None Help from another person taking care of personal grooming?:  A Little Help from another person toileting, which includes using toliet, bedpan, or urinal?: A Little Help from another person bathing (including washing, rinsing, drying)?: A Little Help from another person to put on and taking off regular upper body clothing?: A Little Help from another person to put on and taking off regular lower body clothing?: A Little 6 Click Score: 19   End of Session Equipment Utilized During Treatment: Rolling walker (2 wheels) Nurse Communication: Mobility status  Activity Tolerance: Patient tolerated treatment  well Patient left: in bed;with call bell/phone within reach;with bed alarm set  OT Visit Diagnosis: Unsteadiness on feet (R26.81);Repeated falls (R29.6)                Time: 1610-9604 OT Time Calculation (min): 24 min Charges:  OT General Charges $OT Visit: 1 Visit OT Evaluation $OT Eval Moderate Complexity: 1 Mod OT Treatments $Self Care/Home Management : 8-22 mins  Linward Foster, MS, OTR/L  Alvester Morin 11/24/2022, 1:23 PM

## 2022-11-24 NOTE — Progress Notes (Addendum)
Physical Therapy Treatment Patient Details Name: Luis Miller. MRN: [id number] DOB: 08/14/1945 Today's Date: 11/24/2022   History of Present Illness Pt is a 77y/o male admitted due to a fall and complaints of weakness. Per ED note, he felt dizzy/fell when getting out of bed and was on the ground for 3-4 hours before EMS arrived. Workup for and sepsis secondary to pyelonephritis. transferred to ICU due to BS and lethargy. PMH includes HTN, gout, stroke, prediabetes, chron's disease, IBS, hyperthyroidism, recurrent UTIs, ESRD, and anemia.    PT Comments  Pt presents laying in bed, some pain in neck region but agreeable to participate in PT. He was able to complete supine> sit with supervision, increased time/ effort noted to achieve sitting. Pt also performed sit<>stand, step pivot to recliner, and ambulated ~170ft with CGA/ RW. PT noted overall improved standing balance, decreased posterior lean/reliance on EOB to remain upright, decreased safety awareness (rolled RW into door, but able to correct without physical assist), and no LOB during session. Pt voiced he overall feels unsteady while walking. He would benefit from continued skilled care to maximize functional abilities.    If plan is discharge home, recommend the following: A little help with walking and/or transfers;A little help with bathing/dressing/bathroom;Assistance with cooking/housework;Help with stairs or ramp for entrance;Assist for transportation;Direct supervision/assist for medications management   Can travel by private vehicle     No  Equipment Recommendations  None recommended by PT    Recommendations for Other Services       Precautions / Restrictions Precautions Precautions: Fall Restrictions Weight Bearing Restrictions: No     Mobility  Bed Mobility Overal bed mobility: Needs Assistance Bed Mobility: Supine to Sit     Supine to sit: Supervision     General bed mobility comments: able to scoot EOB  without asisstance    Transfers Overall transfer level: Needs assistance Equipment used: Rolling walker (2 wheels) Transfers: Sit to/from Stand, Bed to chair/wheelchair/BSC Sit to Stand: Contact guard assist   Step pivot transfers: Contact guard assist       General transfer comment: improved upright standing compared to previous visit    Ambulation/Gait Ambulation/Gait assistance: Contact guard assist Gait Distance (Feet): 100 Feet Assistive device: Rolling walker (2 wheels)   Gait velocity: decreased     General Gait Details: pt voiced he feels overall unsteady while walking, decreased awareness of surroundings (bumped into door) but was able to readjust walker position without physical assist   Stairs             Wheelchair Mobility     Tilt Bed    Modified Rankin (Stroke Patients Only)       Balance Overall balance assessment: Needs assistance Sitting-balance support: Feet supported, Bilateral upper extremity supported, No upper extremity supported Sitting balance-Leahy Scale: Good     Standing balance support: Bilateral upper extremity supported, Reliant on assistive device for balance Standing balance-Leahy Scale: Poor                              Cognition Arousal: Alert Behavior During Therapy: WFL for tasks assessed/performed Overall Cognitive Status: Within Functional Limits for tasks assessed                                          Exercises      General Comments  General comments (skin integrity, edema, etc.): Participating well      Pertinent Vitals/Pain Pain Assessment Pain Assessment: Faces Faces Pain Scale: Hurts a little bit Pain Location: neck Pain Intervention(s): Monitored during session, Repositioned    Home Living Family/patient expects to be discharged to:: Private residence Living Arrangements: Alone Available Help at Discharge: Family;Available PRN/intermittently (son and  ex-wife) Type of Home: House Home Access: Level entry       Home Layout: One level Home Equipment: Agricultural consultant (2 wheels);Cane - single point      Prior Function            PT Goals (current goals can now be found in the care plan section) Acute Rehab PT Goals Patient Stated Goal: return home Progress towards PT goals: Progressing toward goals    Frequency    Min 1X/week      PT Plan      Co-evaluation              AM-PAC PT "6 Clicks" Mobility   Outcome Measure  Help needed turning from your back to your side while in a flat bed without using bedrails?: A Little Help needed moving from lying on your back to sitting on the side of a flat bed without using bedrails?: A Little Help needed moving to and from a bed to a chair (including a wheelchair)?: A Little Help needed standing up from a chair using your arms (e.g., wheelchair or bedside chair)?: A Little Help needed to walk in hospital room?: A Little Help needed climbing 3-5 steps with a railing? : A Lot 6 Click Score: 17    End of Session Equipment Utilized During Treatment: Gait belt Activity Tolerance: Patient tolerated treatment well Patient left: in chair;with call bell/phone within reach;with chair alarm set   PT Visit Diagnosis: Other abnormalities of gait and mobility (R26.89);Unsteadiness on feet (R26.81);History of falling (Z91.81);Muscle weakness (generalized) (M62.81);Difficulty in walking, not elsewhere classified (R26.2)     Time: 7829-5621 PT Time Calculation (min) (ACUTE ONLY): 28 min  Charges:    $Therapeutic Activity: 23-37 mins PT General Charges $$ ACUTE PT VISIT: 1 Visit                    Iasia Forcier, PT, SPT 2:54 PM,11/24/22
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
## 2022-11-24 NOTE — Progress Notes (Signed)
[facility] [place], [place] 11/24/22  Subjective:   Hospital day # 3  Patient is a 77 year old male with end-stage renal disease on hemodialysis, hypertension, gout, depression, history of melanoma, Crohn's disease, history of stroke, bipolar disorder.  He was recently hospitalized at [facility] behavioral health for severe bipolar 1 disorder with recurrent depression.  Discharged on 10/05/2022.  Patient had a cystoscopy on 11/19/2022.  No bladder mucosal lesions were seen.  Etiology of hematuria was thought to be most likely BPH.  Sitting up in the chair eating his lunch.  Feels better.   Objective:  Vital signs in last 24 hours:  Temp:  [98.5 F (36.9 C)-99.6 F (37.6 C)] 98.5 F (36.9 C) (09/04 1438) Pulse Rate:  [73-98] 80 (09/04 1438) Resp:  [12-25] 20 (09/04 1400) BP: (123-146)/(56-110) 128/71 (09/04 1438) SpO2:  [98 %-100 %] 99 % (09/04 1438) FiO2 (%):  [21 %] 21 % (09/03 2142) Weight:  [82.3 kg] 82.3 kg (09/04 0500)  Weight change: 0.1 kg Filed Weights   11/23/22 0857 11/23/22 1250 11/24/22 0500  Weight: 78.2 kg 78.2 kg 82.3 kg    Intake/Output:    Intake/Output Summary (Last 24 hours) at 11/24/2022 1504 Last data filed at 11/24/2022 1420 Gross per 24 hour  Intake 630 ml  Output 1000 ml  Net -370 ml     Physical Exam: General: NAD,   HEENT Moist oral mucous membranes  Pulm/lungs Normal breathing effort, clear to auscultation  CVS/Heart Regular, no rub  Abdomen:  Soft, nontender, nondistended  Extremities: No peripheral edema  Neurologic: Alert, able to answer simple questions.  Generalized weakness.  Skin: No acute rashes  Access: Right IJ dialysis catheter.       Basic Metabolic Panel:  Recent Labs  Lab 11/21/22 1710 11/23/22 0914 11/23/22 1419 11/24/22 0605  NA 138 138 137 136  K 3.6 3.5 3.4* 3.6  CL 94* 103 97* 100  CO2 28 23 30 28   GLUCOSE 117* 210* 119* 129*  BUN 41* 59* 23 39*  CREATININE 6.33* 7.93* 3.53* 4.92*  CALCIUM  9.2 8.4* 8.4* 8.3*  PHOS  --   --  2.4*  --      CBC: Recent Labs  Lab 11/21/22 1710 11/23/22 0914 11/23/22 1419 11/24/22 0605  WBC 11.0* 7.2 7.6 5.7  NEUTROABS 9.9*  --   --   --   HGB 9.4* 8.0* 8.5* 7.6*  HCT 28.5* 23.9* 24.9* 22.3*  MCV 104.8* 103.9* 100.0 100.9*  PLT 110* 94* 105* 104*      Lab Results  Component Value Date   HEPBSAG NON REACTIVE 11/22/2022   HEPBSAB NON REACTIVE 08/02/2019      Microbiology:  Recent Results (from the past 240 hour(s))  Blood Culture (routine x 2)     Status: Abnormal   Collection Time: 11/21/22  5:11 PM   Specimen: BLOOD  Result Value Ref Range Status   Specimen Description   Final    BLOOD RIGHT ANTECUBITAL Performed at [facility], [address], [place], [place] [address]    Special Requests   Final    BOTTLES DRAWN AEROBIC AND ANAEROBIC Blood Culture adequate volume Performed at [facility], [address] [place], [place] [address]    Culture  Setup Time   Final    GRAM NEGATIVE RODS IN BOTH AEROBIC AND ANAEROBIC BOTTLES CRITICAL RESULT CALLED TO, READ BACK BY AND VERIFIED WITH: Dawayne Cirri 1914 11/22/22 SLM Performed at [facility], [address], [place], [place] [address]    Culture ESCHERICHIA COLI (A)  Final   Report Status 11/24/2022 FINAL  Final   Organism ID, Bacteria ESCHERICHIA COLI  Final      Susceptibility   Escherichia coli - MIC*    AMPICILLIN >=32 RESISTANT Resistant     CEFEPIME <=0.12 SENSITIVE Sensitive     CEFTAZIDIME <=1 SENSITIVE Sensitive     CEFTRIAXONE <=0.25 SENSITIVE Sensitive     CIPROFLOXACIN <=0.25 SENSITIVE Sensitive     GENTAMICIN <=1 SENSITIVE Sensitive     IMIPENEM <=0.25 SENSITIVE Sensitive     TRIMETH/SULFA >=320 RESISTANT Resistant     AMPICILLIN/SULBACTAM >=32 RESISTANT Resistant     PIP/TAZO 32 INTERMEDIATE Intermediate     * ESCHERICHIA COLI  Blood Culture (routine x 2)     Status: Abnormal   Collection Time: 11/21/22  5:11 PM    Specimen: BLOOD  Result Value Ref Range Status   Specimen Description   Final    BLOOD BLOOD RIGHT FOREARM Performed at [facility], [address], [place], [place] [address]    Special Requests   Final    BOTTLES DRAWN AEROBIC AND ANAEROBIC Blood Culture adequate volume Performed at [facility], [address] [place] [address] [place], [place] [address]    Culture  Setup Time   Final    GRAM NEGATIVE RODS IN BOTH AEROBIC AND ANAEROBIC BOTTLES CRITICAL VALUE NOTED.  VALUE IS CONSISTENT WITH PREVIOUSLY REPORTED AND CALLED VALUE. Performed at [facility], [address] [place], [place] [address]    Culture (A)  Final    ESCHERICHIA COLI SUSCEPTIBILITIES PERFORMED ON PREVIOUS CULTURE WITHIN THE LAST 5 DAYS. Performed at [facility], [address], [place], [place] [address]    Report Status 11/24/2022 FINAL  Final  Blood Culture ID Panel (Reflexed)     Status: Abnormal   Collection Time: 11/21/22  5:11 PM  Result Value Ref Range Status   Enterococcus faecalis NOT DETECTED NOT DETECTED Final   Enterococcus Faecium NOT DETECTED NOT DETECTED Final   Listeria monocytogenes NOT DETECTED NOT DETECTED Final   Staphylococcus species NOT DETECTED NOT DETECTED Final   Staphylococcus aureus (BCID) NOT DETECTED NOT DETECTED Final   Staphylococcus epidermidis NOT DETECTED NOT DETECTED Final   Staphylococcus lugdunensis NOT DETECTED NOT DETECTED Final   Streptococcus species NOT DETECTED NOT DETECTED Final   Streptococcus agalactiae NOT DETECTED NOT DETECTED Final   Streptococcus pneumoniae NOT DETECTED NOT DETECTED Final   Streptococcus pyogenes NOT DETECTED NOT DETECTED Final   A.calcoaceticus-baumannii NOT DETECTED NOT DETECTED Final   Bacteroides fragilis NOT DETECTED NOT DETECTED Final   Enterobacterales DETECTED (A) NOT DETECTED Final    Comment: Enterobacterales represent a large order of gram negative bacteria, not a single organism. CRITICAL RESULT CALLED  TO, READ BACK BY AND VERIFIED WITH: NATHAN BELUE 0617 11/22/22 SLM    Enterobacter cloacae complex NOT DETECTED NOT DETECTED Final   Escherichia coli DETECTED (A) NOT DETECTED Final    Comment: CRITICAL RESULT CALLED TO, READ BACK BY AND VERIFIED WITH: NATHAN BELUE 0617 11/22/22 SLM    Klebsiella aerogenes NOT DETECTED NOT DETECTED Final   Klebsiella oxytoca NOT DETECTED NOT DETECTED Final   Klebsiella pneumoniae NOT DETECTED NOT DETECTED Final   Proteus species NOT DETECTED NOT DETECTED Final   Salmonella species NOT DETECTED NOT DETECTED Final   Serratia marcescens NOT DETECTED NOT DETECTED Final   Haemophilus influenzae NOT DETECTED NOT DETECTED Final   Neisseria meningitidis  NOT DETECTED NOT DETECTED Final   Pseudomonas aeruginosa NOT DETECTED NOT DETECTED Final   Stenotrophomonas maltophilia NOT DETECTED NOT DETECTED Final   Candida albicans NOT DETECTED NOT DETECTED Final   Candida auris NOT DETECTED NOT DETECTED Final   Candida glabrata NOT DETECTED NOT DETECTED Final   Candida krusei NOT DETECTED NOT DETECTED Final   Candida parapsilosis NOT DETECTED NOT DETECTED Final   Candida tropicalis NOT DETECTED NOT DETECTED Final   Cryptococcus neoformans/gattii NOT DETECTED NOT DETECTED Final   CTX-M ESBL NOT DETECTED NOT DETECTED Final   Carbapenem resistance IMP NOT DETECTED NOT DETECTED Final   Carbapenem resistance KPC NOT DETECTED NOT DETECTED Final   Carbapenem resistance NDM NOT DETECTED NOT DETECTED Final   Carbapenem resist OXA 48 LIKE NOT DETECTED NOT DETECTED Final   Carbapenem resistance VIM NOT DETECTED NOT DETECTED Final    Comment: Performed at [facility], [address] [place], [place] [address]  Resp panel by RT-PCR (RSV, Flu A&B, Covid) Anterior Nasal Swab     Status: None   Collection Time: 11/21/22  6:10 PM   Specimen: Anterior Nasal Swab  Result Value Ref Range Status   SARS Coronavirus 2 by RT PCR NEGATIVE NEGATIVE Final    Comment:  (NOTE) SARS-CoV-2 target nucleic acids are NOT DETECTED.  The SARS-CoV-2 RNA is generally detectable in upper respiratory specimens during the acute phase of infection. The lowest concentration of SARS-CoV-2 viral copies this assay can detect is 138 copies/mL. A negative result does not preclude SARS-Cov-2 infection and should not be used as the sole basis for treatment or other patient management decisions. A negative result may occur with  improper specimen collection/handling, submission of specimen other than nasopharyngeal swab, presence of viral mutation(s) within the areas targeted by this assay, and inadequate number of viral copies(<138 copies/mL). A negative result must be combined with clinical observations, patient history, and epidemiological information. The expected result is Negative.  Fact Sheet for Patients:  BloggerCourse.com  Fact Sheet for Healthcare Providers:  SeriousBroker.it  This test is no t yet approved or cleared by the [place] FDA and  has been authorized for detection and/or diagnosis of SARS-CoV-2 by FDA under an Emergency Use Authorization (EUA). This EUA will remain  in effect (meaning this test can be used) for the duration of the COVID-19 declaration under Section 564(b)(1) of the Act, 21 U.S.C.section 360bbb-3(b)(1), unless the authorization is terminated  or revoked sooner.       Influenza A by PCR NEGATIVE NEGATIVE Final   Influenza B by PCR NEGATIVE NEGATIVE Final    Comment: (NOTE) The Xpert Xpress SARS-CoV-2/FLU/RSV plus assay is intended as an aid in the diagnosis of influenza from Nasopharyngeal swab specimens and should not be used as a sole basis for treatment. Nasal washings and aspirates are unacceptable for Xpert Xpress SARS-CoV-2/FLU/RSV testing.  Fact Sheet for Patients: BloggerCourse.com  Fact Sheet for Healthcare  Providers: SeriousBroker.it  This test is not yet approved or cleared by the [place] FDA and has been authorized for detection and/or diagnosis of SARS-CoV-2 by FDA under an Emergency Use Authorization (EUA). This EUA will remain in effect (meaning this test can be used) for the duration of the COVID-19 declaration under Section 564(b)(1) of the Act, 21 U.S.C. section 360bbb-3(b)(1), unless the authorization is terminated or revoked.     Resp Syncytial Virus by PCR NEGATIVE NEGATIVE Final    Comment: (NOTE) Fact Sheet for Patients: BloggerCourse.com  Fact Sheet for Healthcare Providers:  SeriousBroker.it  This test is not yet approved or cleared by the [place] and has been authorized for detection and/or diagnosis of SARS-CoV-2 by FDA under an Emergency Use Authorization (EUA). This EUA will remain in effect (meaning this test can be used) for the duration of the COVID-19 declaration under Section 564(b)(1) of the Act, 21 U.S.C. section 360bbb-3(b)(1), unless the authorization is terminated or revoked.  Performed at [facility], [address] [place], [place] [address]   MRSA Next Gen by PCR, Nasal     Status: None   Collection Time: 11/22/22  1:27 PM   Specimen: Nasal Mucosa; Nasal Swab  Result Value Ref Range Status   MRSA by PCR Next Gen NOT DETECTED NOT DETECTED Final    Comment: (NOTE) The GeneXpert MRSA Assay (FDA approved for NASAL specimens only), is one component of a comprehensive MRSA colonization surveillance program. It is not intended to diagnose MRSA infection nor to guide or monitor treatment for MRSA infections. Test performance is not FDA approved in patients less than 27 years old. Performed at [facility], [address] [place], [place] [address]     Coagulation Studies: Recent Labs    11/21/22 1711  LABPROT 16.5*  INR 1.3*     Urinalysis: Recent Labs    11/21/22 2000  COLORURINE YELLOW*  LABSPEC 1.020  PHURINE 8.5*  GLUCOSEU NEGATIVE  HGBUR LARGE*  BILIRUBINUR NEGATIVE  KETONESUR NEGATIVE  PROTEINUR >300*  NITRITE NEGATIVE  LEUKOCYTESUR TRACE*      Imaging: No results found.   Medications:    anticoagulant sodium citrate     cefTRIAXone (ROCEPHIN)  IV 2 g (11/23/22 1823)    buPROPion  150 mg Oral Daily   calcitRIOL  0.25 mcg Oral Daily   Chlorhexidine Gluconate Cloth  6 each Topical Q0600   cholecalciferol  1,000 Units Oral Daily   docusate sodium  100 mg Oral BID   feeding supplement (NEPRO CARB STEADY)  237 mL Oral BID BM   lamoTRIgine  25 mg Oral [id number]   lithium carbonate  150 mg Oral Q supper   multivitamin  1 tablet Oral QHS   OLANZapine  10 mg Oral QHS   polyethylene glycol  17 g Oral Daily   sodium chloride flush  3 mL Intravenous Q12H   acetaminophen **OR** acetaminophen, alteplase, anticoagulant sodium citrate, feeding supplement (NEPRO CARB STEADY), heparin, lidocaine (PF), lidocaine-prilocaine, pentafluoroprop-tetrafluoroeth  Assessment/ Plan:  77 y.o. male with bipolar disorder, recent admission for depression, end-stage renal disease, diabetes, hypertension, hyperlipidemia, gout, left retinal stroke, Crohn's disease, recurrent UTIs    admitted on 11/21/2022 for Fall, initial encounter [W19.XXXA] Fall at home, initial encounter [phone], Y92.009] Sepsis, due to unspecified organism, unspecified whether acute organ dysfunction present [facility]) [A41.9]  Outpatient dialysis: TTS/ Rt internal jugular PC  #End-stage renal disease on hemodialysis - Next treatment scheduled for Thursday.  #Anemia of chronic kidney disease  - Hemoglobin 7.6, below goal  - Continue low-dose Epogen with hemodialysis treatments.  #Sepsis   -Status post cystoscopy on 11/19/2022.  -Blood cultures are positive for Enterobacterales and E. Coli. Likely urinary source as u/a is abnormal.   -He is  now on ceftriaxone 2 g daily.  -Supportive care for sepsis.    #Secondary hyperparathyroidism  - Calcium within desired range.   # Constipation,  -Was given a one-time dose of lactulose.    LOS: 3 Bunyan Brier Thedore Mins 9/4/20243:04 PM  [facility] [place], [place] [phone]

## 2022-11-24 NOTE — Progress Notes (Signed)
  Progress Note   Patient: Luis Miller. HQI:[id number] DOB: Sep 17, 1945 DOA: 11/21/2022     3 DOS: the patient was seen and examined on 11/24/2022   Brief hospital course: 77 year old man with history of end-stage renal disease on hemodialysis, anemia, bipolar disorder, sleep apnea, gout, hypertension coming in after a fall.  Patient found to have a fever.  Patient had a recent cystoscopy for hematuria. Positive for E. coli in blood cultures.  CT scan concerning for right pyelonephritis, hydronephrosis.  Antibiotics switched over to Rocephin.    Transfer out of ICU on 9/3.   Principal Problem:   Severe sepsis (HCC) Active Problems:   Acute metabolic encephalopathy   Hypotension   Hypoglycemia   Rhabdomyolysis   ESRD on hemodialysis (HCC)   Obstructive sleep apnea   Gross hematuria   Anemia of chronic disease   Constipation   Raynaud's phenomenon   Overweight (BMI 25.0-29.9)   Assessment and Plan: * Severe sepsis (HCC) with E. coli bacteremia. Right pyelonephritis secondary to E. coli. Present on admission, with pyelonephritis with E. coli growing in blood cultures, fever, hypotension, leukocytosis, a acute metabolic encephalopathy.  Antibiotics switched to Rocephin on 9/2.Marland Kitchen  Patient had recent cystoscopy. Condition seems to be stabilizing.  Repeat blood culture today.  Source of infection most likely secondary to right pyelonephritis, however, there is no urine culture sent out.  ID consult. Echocardiogram showed ejection fraction 60 to 65% without significant valvular disease.   Acute metabolic encephalopathy Improved.  Hypotension Initial hypotension has resolved.  Discontinue midodrine.  Hypoglycemia Condition has improved after eating.  Rhabdomyolysis CK improved from 6869 down to 1161.  ESRD on hemodialysis [facility]) HD per nephrology.  Raynaud's phenomenon Of his hands, follow.  Constipation Continue stool softeners.  Anemia of chronic disease Continue to  follow.  Gross hematuria Had recent cystoscopy which did not show any etiology for hematuria.  Likely BPH.   Obstructive sleep apnea On CPAP at night       Subjective:  Patient doing better today, no confusion.  Physical Exam: Vitals:   11/24/22 1200 11/24/22 1300 11/24/22 1400 11/24/22 1438  BP: 133/74  (!) 145/78 128/71  Pulse: 77 73 85 80  Resp: (!) 25 12 20    Temp:    98.5 F (36.9 C)  TempSrc:      SpO2: 100% 100% 100% 99%  Weight:      Height:       General exam: Appears calm and comfortable  Respiratory system: Clear to auscultation. Respiratory effort normal. Cardiovascular system: S1 & S2 heard, RRR. No JVD, murmurs, rubs, gallops or clicks. No pedal edema. Gastrointestinal system: Abdomen is nondistended, soft and nontender. No organomegaly or masses felt. Normal bowel sounds heard. Central nervous system: Alert and oriented. No focal neurological deficits. Extremities: Symmetric 5 x 5 power. Skin: No rashes, lesions or ulcers Psychiatry: Judgement and insight appear normal. Mood & affect appropriate.    Data Reviewed:  T scan and the lab results reviewed.None Family Communication: None  Disposition: Status is: Inpatient Remains inpatient appropriate because: Severity of disease, IV treatment.     Time spent: 50 minutes  Author: Marrion Coy, MD 11/24/2022 3:39 PM  For on call review [email or web address].

## 2022-11-25 DIAGNOSIS — N10 Acute tubulo-interstitial nephritis: Secondary | ICD-10-CM | POA: Insufficient documentation

## 2022-11-25 DIAGNOSIS — A419 Sepsis, unspecified organism: Secondary | ICD-10-CM | POA: Diagnosis not present

## 2022-11-25 DIAGNOSIS — R6520 Severe sepsis without septic shock: Secondary | ICD-10-CM | POA: Diagnosis not present

## 2022-11-25 DIAGNOSIS — G9341 Metabolic encephalopathy: Secondary | ICD-10-CM | POA: Diagnosis not present

## 2022-11-25 DIAGNOSIS — N186 End stage renal disease: Secondary | ICD-10-CM | POA: Diagnosis not present

## 2022-11-25 LAB — CBC
HCT: 22.3 % — ABNORMAL LOW (ref 39.0–52.0)
HCT: 22 % — ABNORMAL LOW (ref 39.0–52.0)
Hemoglobin: 7.5 g/dL — ABNORMAL LOW (ref 13.0–17.0)
Hemoglobin: 7.3 g/dL — ABNORMAL LOW (ref 13.0–17.0)
MCH: 33.9 pg (ref 26.0–34.0)
MCH: 34 pg (ref 26.0–34.0)
MCHC: 33.6 g/dL (ref 30.0–36.0)
MCHC: 33.2 g/dL (ref 30.0–36.0)
MCV: 100.9 fL — ABNORMAL HIGH (ref 80.0–100.0)
MCV: 102.3 fL — ABNORMAL HIGH (ref 80.0–100.0)
Platelets: 104 10*3/uL — ABNORMAL LOW (ref 150–400)
Platelets: 103 10*3/uL — ABNORMAL LOW (ref 150–400)
RBC: 2.21 MIL/uL — ABNORMAL LOW (ref 4.22–5.81)
RBC: 2.15 MIL/uL — ABNORMAL LOW (ref 4.22–5.81)
RDW: 13.1 % (ref 11.5–15.5)
RDW: 13 % (ref 11.5–15.5)
WBC: 4.7 10*3/uL (ref 4.0–10.5)
WBC: 4.8 10*3/uL (ref 4.0–10.5)
nRBC: 0 % (ref 0.0–0.2)
nRBC: 0 % (ref 0.0–0.2)

## 2022-11-25 LAB — RENAL FUNCTION PANEL
Albumin: 2.6 g/dL — ABNORMAL LOW (ref 3.5–5.0)
Anion gap: 11 (ref 5–15)
BUN: 61 mg/dL — ABNORMAL HIGH (ref 8–23)
CO2: 26 mmol/L (ref 22–32)
Calcium: 8.6 mg/dL — ABNORMAL LOW (ref 8.9–10.3)
Chloride: 99 mmol/L (ref 98–111)
Creatinine, Ser: 5.72 mg/dL — ABNORMAL HIGH (ref 0.61–1.24)
GFR, Estimated: 10 mL/min — ABNORMAL LOW (ref >60)
Glucose, Bld: 117 mg/dL — ABNORMAL HIGH (ref 70–99)
Phosphorus: 3.7 mg/dL (ref 2.5–4.6)
Potassium: 3.4 mmol/L — ABNORMAL LOW (ref 3.5–5.1)
Sodium: 136 mmol/L (ref 135–145)

## 2022-11-25 MED ORDER — EPOETIN ALFA 10000 UNIT/ML IJ SOLN
10000.0000 [IU] | INTRAMUSCULAR | Status: DC
  Administered 2022-11-25: 10000 [IU] via INTRAVENOUS

## 2022-11-25 MED ORDER — EPOETIN ALFA 10000 UNIT/ML IJ SOLN
INTRAMUSCULAR | Status: AC
  Filled 2022-11-25: qty 1

## 2022-11-25 MED ORDER — EPOETIN ALFA 10000 UNIT/ML IJ SOLN: 10000.0000 [IU] | INTRAMUSCULAR | Status: AC

## 2022-11-25 MED ORDER — ORAL CARE MOUTH RINSE: 15.0000 mL | OROMUCOSAL | Status: DC | PRN

## 2022-11-25 MED ORDER — LEVOFLOXACIN 750 MG PO TABS
750.0000 mg | ORAL_TABLET | Freq: Once | ORAL | Status: AC
  Administered 2022-11-26: 750 mg via ORAL
  Filled 2022-11-25: qty 1

## 2022-11-25 MED ORDER — LEVOFLOXACIN 250 MG PO TABS
ORAL_TABLET | ORAL | Status: DC
Start: 2022-11-26 — End: 2022-11-26

## 2022-11-25 MED ORDER — LEVOFLOXACIN 250 MG PO TABS: 250.0000 mg | ORAL_TABLET | Freq: Every day | ORAL | Status: DC

## 2022-11-25 NOTE — Care Management Important Message (Signed)
Important Message  Patient Details  Name: Luis Miller. MRN: [id number] Date of Birth: Nov 22, 1945   Medicare Important Message Given:  Yes     [name] 11/25/2022, 2:03 PM

## 2022-11-25 NOTE — Plan of Care (Signed)
°  Problem: Fluid Volume: °Goal: Hemodynamic stability will improve °Outcome: Progressing °  °Problem: Clinical Measurements: °Goal: Diagnostic test results will improve °Outcome: Progressing °Goal: Signs and symptoms of infection will decrease °Outcome: Progressing °  °

## 2022-11-25 NOTE — Progress Notes (Signed)
Mobility Specialist - Progress Note   11/25/22 1612  Mobility  Activity Ambulated with assistance to bathroom;Ambulated with assistance in room;Ambulated with assistance in hallway  Level of Assistance Standby assist, set-up cues, supervision of patient - no hands on  Assistive Device Front wheel walker  Distance Ambulated (ft) 180 ft  Activity Response Tolerated well  $Mobility charge 1 Mobility  Mobility Specialist Start Time (ACUTE ONLY) 1539  Mobility Specialist Stop Time (ACUTE ONLY) 1601  Mobility Specialist Time Calculation (min) (ACUTE ONLY) 22 min   Pt supine upon entry, utilizing RA. Pt completed bed mob ModI-- extra time required to bring BLE EOB. Pt STS to RW MinA and amb one lap around the NS MinG-SBA, tolerated well. Pt returned to the room, amb to the bathroom and back to bed. Pt left supine with alarm set and needs within reach.  Zetta Bills Mobility Specialist 11/25/22 4:15 PM

## 2022-11-25 NOTE — Progress Notes (Signed)
Hemodialysis Note  Received patient in bed to unit. Alert and oriented. Informed consent signed and in chart.   Treatment initiated:0903 Treatment completed:1253  Patient tolerated treatment well. Transported back to the room alert, without acute distress. Report given to patient's RN.  Access used: LUA AVF  Access issues: None   Total UF removed: 2000 ml/ 2.0 Liters  Medications given: None  Post HD VS: Stable  Post HD weight: 78.4 kg standing   Bartolo Darter, RN [facility]

## 2022-11-25 NOTE — TOC Progression Note (Signed)
Transition of Care (TOC) - Progression Note    Patient Details  Name: Luis Miller. MRN: [id number] Date of Birth: Nov 12, 1945  Transition of Care [facility]) CM/SW Contact  Chapman Fitch, RN Phone Number: 11/25/2022, 3:20 PM  Clinical Narrative:     Bed offers presented to patient.  Patient accepts offer at [facility] Accepted bed in [facility], Tammy at [facility] notified  Message sent to MD to determine when patient is medically ready for discharge   Expected Discharge Plan:  (TBD) Barriers to Discharge: Continued Medical Work up  Expected Discharge Plan and Services       Living arrangements for the past 2 months: Single Family Home                                       Social Determinants of Health (SDOH) Interventions SDOH Screenings   Food Insecurity: No Food Insecurity (11/21/2022)  Housing: Patient Declined (11/21/2022)  Transportation Needs: Patient Declined (11/21/2022)  Utilities: Not At Risk (11/21/2022)  Alcohol Screen: Low Risk  (09/30/2022)  Depression (PHQ2-9): High Risk (11/05/2022)  Financial Resource Strain: Low Risk  (12/07/2021)  Physical Activity: Insufficiently Active (12/07/2021)  Social Connections: Moderately Integrated (12/07/2021)  Stress: No Stress Concern Present (12/07/2021)  Tobacco Use: Medium Risk (11/22/2022)    Readmission Risk Interventions     No data to display

## 2022-11-25 NOTE — Progress Notes (Signed)
Progress Note   Patient: Luis Miller. WUJ:[id number] DOB: 1945/08/04 DOA: 11/21/2022     4 DOS: the patient was seen and examined on 11/25/2022   Brief hospital course: 77 year old man with history of end-stage renal disease on hemodialysis, anemia, bipolar disorder, sleep apnea, gout, hypertension coming in after a fall.  Patient found to have a fever.  Patient had a recent cystoscopy for hematuria. Positive for E. coli in blood cultures.  CT scan concerning for right pyelonephritis, hydronephrosis.  Antibiotics switched over to Rocephin.    Transfer out of ICU on 9/3.   Principal Problem:   Severe sepsis (HCC) Active Problems:   Acute metabolic encephalopathy   Hypotension   Hypoglycemia   Rhabdomyolysis   ESRD on hemodialysis (HCC)   Obstructive sleep apnea   Gross hematuria   Anemia of chronic disease   Constipation   Raynaud's phenomenon   Overweight (BMI 25.0-29.9)   Assessment and Plan: * Severe sepsis (HCC) with E. coli bacteremia. Right pyelonephritis secondary to E. coli. Present on admission, with pyelonephritis with E. coli growing in blood cultures, fever, hypotension, leukocytosis, acute metabolic encephalopathy.  Antibiotics switched to Rocephin on 9/2.Marland Kitchen  Patient had recent cystoscopy. Condition seems to be stabilizing.  Repeat blood culture negative x 24 hours.  Source of infection most likely secondary to right pyelonephritis, however, there is no urine culture sent out.  ID consult obtained.  Echocardiogram showed ejection fraction 60 to 65% without significant valvular disease. Condition improving, planning to treat with 14 days antibiotics with oral agent.  Elevated troponin secondary to mismatch demand and supply.   Likely due to sepsis.  Condition stable.  Acute metabolic encephalopathy Improved.   Hypotension Initial hypotension has resolved.  Discontinued midodrine.   Hypoglycemia Condition has improved after eating.   Rhabdomyolysis CK  improved from 6869 down to 1161.   ESRD on hemodialysis (HCC) Hypokalemia. HD per nephrology.   Raynaud's phenomenon Of his hands, follow.   Constipation Continue stool softeners.   Anemia of chronic disease Chronic thrombocytopenia. Hemoglobin trending down to 7.3 today, anticipating blood transfusion tomorrow if hemoglobin dropped down to below 7.0.   Gross hematuria Had recent cystoscopy which did not show any etiology for hematuria.  Likely BPH.     Obstructive sleep apnea On CPAP at night        Subjective:  Patient doing well today, no complaint.  Physical Exam: Vitals:   11/25/22 1100 11/25/22 1115 11/25/22 1130 11/25/22 1200  BP: 136/64 120/63 115/60 111/60  Pulse: 66 68 61 64  Resp: 18 18 17 18   Temp:      TempSrc:      SpO2: 100% 99% 99% 100%  Weight:      Height:       General exam: Appears calm and comfortable  Respiratory system: Clear to auscultation. Respiratory effort normal. Cardiovascular system: S1 & S2 heard, RRR. No JVD, murmurs, rubs, gallops or clicks. No pedal edema. Gastrointestinal system: Abdomen is nondistended, soft and nontender. No organomegaly or masses felt. Normal bowel sounds heard. Central nervous system: Alert and oriented x3. No focal neurological deficits. Extremities: Symmetric 5 x 5 power. Skin: No rashes, lesions or ulcers Psychiatry: Judgement and insight appear normal. Mood & affect appropriate.    Data Reviewed:  Lab results reviewed.  Family Communication: None  Disposition: Status is: Inpatient Remains inpatient appropriate because: Severity of disease, IV treatment.     Time spent: 35 minutes  Author: Marrion Coy, MD 11/25/2022 12:58 PM  For on call review [email or web address].

## 2022-11-25 NOTE — Plan of Care (Signed)

## 2022-11-25 NOTE — Progress Notes (Signed)
[facility] [place], [place] 11/25/22  Subjective:   Hospital day # 4  Patient is a 77 year old male with end-stage renal disease on hemodialysis, hypertension, gout, depression, history of melanoma, Crohn's disease, history of stroke, bipolar disorder.  He was recently hospitalized at [facility] behavioral [facility] for severe bipolar 1 disorder with recurrent depression.  Discharged on 10/05/2022.  Patient had a cystoscopy on 11/19/2022.  No bladder mucosal lesions were seen.  Etiology of hematuria was thought to be most likely BPH.  Patient seen and evaluated during dialysis    HEMODIALYSIS FLOWSHEET:  Blood Flow Rate (mL/min): 399 mL/min Arterial Pressure (mmHg): -244.03 mmHg Venous Pressure (mmHg): 190.5 mmHg TMP (mmHg): 13.94 mmHg Ultrafiltration Rate (mL/min): 829 mL/min Dialysate Flow Rate (mL/min): 300 ml/min  Tolerating treatment well No complaints   Objective:  Vital signs in last 24 hours:  Temp:  [98.2 F (36.8 C)-98.9 F (37.2 C)] 98.3 F (36.8 C) (09/05 0854) Pulse Rate:  [56-85] 70 (09/05 1045) Resp:  [0-25] 16 (09/05 1045) BP: (114-145)/(58-78) 126/58 (09/05 1045) SpO2:  [96 %-100 %] 98 % (09/05 1045) FiO2 (%):  [21 %] 21 % (09/04 2230) Weight:  [80.5 kg-82.2 kg] 80.5 kg (09/05 0906)  Weight change: 4 kg Filed Weights   11/24/22 0500 11/25/22 0500 11/25/22 0906  Weight: 82.3 kg 82.2 kg 80.5 kg    Intake/Output:    Intake/Output Summary (Last 24 hours) at 11/25/2022 1122 Last data filed at 11/24/2022 2000 Gross per 24 hour  Intake 120 ml  Output --  Net 120 ml     Physical Exam: General: NAD  HEENT Moist oral mucous membranes  Pulm/lungs Normal breathing effort, clear to auscultation  CVS/Heart Regular, no rub  Abdomen:  Soft, nontender, nondistended  Extremities: No peripheral edema  Neurologic: Alert, able to answer simple questions.  Generalized weakness.  Skin: No acute rashes  Access: Lt AVF       Basic Metabolic  Panel:  Recent Labs  Lab 11/21/22 1710 11/23/22 0914 11/23/22 1419 11/24/22 0605 11/25/22 0910  NA 138 138 137 136 136  K 3.6 3.5 3.4* 3.6 3.4*  CL 94* 103 97* 100 99  CO2 28 23 30 28 26   GLUCOSE 117* 210* 119* 129* 117*  BUN 41* 59* 23 39* 61*  CREATININE 6.33* 7.93* 3.53* 4.92* 5.72*  CALCIUM 9.2 8.4* 8.4* 8.3* 8.6*  PHOS  --   --  2.4*  --  3.7     CBC: Recent Labs  Lab 11/21/22 1710 11/23/22 0914 11/23/22 1419 11/24/22 0605 11/25/22 0632 11/25/22 0910  WBC 11.0* 7.2 7.6 5.7 4.7 4.8  NEUTROABS 9.9*  --   --   --   --   --   HGB 9.4* 8.0* 8.5* 7.6* 7.5* 7.3*  HCT 28.5* 23.9* 24.9* 22.3* 22.3* 22.0*  MCV 104.8* 103.9* 100.0 100.9* 100.9* 102.3*  PLT 110* 94* 105* 104* 104* 103*      Lab Results  Component Value Date   HEPBSAG NON REACTIVE 11/22/2022   HEPBSAB NON REACTIVE 08/02/2019      Microbiology:  Recent Results (from the past 240 hour(s))  Blood Culture (routine x 2)     Status: Abnormal   Collection Time: 11/21/22  5:11 PM   Specimen: BLOOD  Result Value Ref Range Status   Specimen Description   Final    BLOOD RIGHT ANTECUBITAL Performed at [facility], [address] [place], [place] [address]    Special Requests   Final  BOTTLES DRAWN AEROBIC AND ANAEROBIC Blood Culture adequate volume Performed at [facility], [address] [place], [place] [address]    Culture  Setup Time   Final    GRAM NEGATIVE RODS IN BOTH AEROBIC AND ANAEROBIC BOTTLES CRITICAL RESULT CALLED TO, READ BACK BY AND VERIFIED WITH: Dawayne Cirri 1914 11/22/22 SLM Performed at [facility], [address], [place], [place] [address]    Culture ESCHERICHIA COLI (A)  Final   Report Status 11/24/2022 FINAL  Final   Organism ID, Bacteria ESCHERICHIA COLI  Final      Susceptibility   Escherichia coli - MIC*    AMPICILLIN >=32 RESISTANT Resistant     CEFEPIME <=0.12 SENSITIVE Sensitive     CEFTAZIDIME <=1 SENSITIVE Sensitive     CEFTRIAXONE  <=0.25 SENSITIVE Sensitive     CIPROFLOXACIN <=0.25 SENSITIVE Sensitive     GENTAMICIN <=1 SENSITIVE Sensitive     IMIPENEM <=0.25 SENSITIVE Sensitive     TRIMETH/SULFA >=320 RESISTANT Resistant     AMPICILLIN/SULBACTAM >=32 RESISTANT Resistant     PIP/TAZO 32 INTERMEDIATE Intermediate     * ESCHERICHIA COLI  Blood Culture (routine x 2)     Status: Abnormal   Collection Time: 11/21/22  5:11 PM   Specimen: BLOOD  Result Value Ref Range Status   Specimen Description   Final    BLOOD BLOOD RIGHT FOREARM Performed at [facility], [address] [place], [place] [address]    Special Requests   Final    BOTTLES DRAWN AEROBIC AND ANAEROBIC Blood Culture adequate volume Performed at [facility], [address] [place], [place] [address]    Culture  Setup Time   Final    GRAM NEGATIVE RODS IN BOTH AEROBIC AND ANAEROBIC BOTTLES CRITICAL VALUE NOTED.  VALUE IS CONSISTENT WITH PREVIOUSLY REPORTED AND CALLED VALUE. Performed at [facility], [address] [place], [place] [address]    Culture (A)  Final    ESCHERICHIA COLI SUSCEPTIBILITIES PERFORMED ON PREVIOUS CULTURE WITHIN THE LAST 5 DAYS. Performed at [facility], [address] [place], [place] [address]    Report Status 11/24/2022 FINAL  Final  Blood Culture ID Panel (Reflexed)     Status: Abnormal   Collection Time: 11/21/22  5:11 PM  Result Value Ref Range Status   Enterococcus faecalis NOT DETECTED NOT DETECTED Final   Enterococcus Faecium NOT DETECTED NOT DETECTED Final   Listeria monocytogenes NOT DETECTED NOT DETECTED Final   Staphylococcus species NOT DETECTED NOT DETECTED Final   Staphylococcus aureus (BCID) NOT DETECTED NOT DETECTED Final   Staphylococcus epidermidis NOT DETECTED NOT DETECTED Final   Staphylococcus lugdunensis NOT DETECTED NOT DETECTED Final   Streptococcus species NOT DETECTED NOT DETECTED Final   Streptococcus agalactiae NOT DETECTED NOT DETECTED Final    Streptococcus pneumoniae NOT DETECTED NOT DETECTED Final   Streptococcus pyogenes NOT DETECTED NOT DETECTED Final   A.calcoaceticus-baumannii NOT DETECTED NOT DETECTED Final   Bacteroides fragilis NOT DETECTED NOT DETECTED Final   Enterobacterales DETECTED (A) NOT DETECTED Final    Comment: Enterobacterales represent a large order of gram negative bacteria, not a single organism. CRITICAL RESULT CALLED TO, READ BACK BY AND VERIFIED WITH: NATHAN BELUE 0617 11/22/22 SLM    Enterobacter cloacae complex NOT DETECTED NOT DETECTED Final   Escherichia coli DETECTED (A) NOT DETECTED Final    Comment: CRITICAL RESULT CALLED TO, READ BACK BY AND VERIFIED WITH: NATHAN BELUE 0617 11/22/22 SLM  Klebsiella aerogenes NOT DETECTED NOT DETECTED Final   Klebsiella oxytoca NOT DETECTED NOT DETECTED Final   Klebsiella pneumoniae NOT DETECTED NOT DETECTED Final   Proteus species NOT DETECTED NOT DETECTED Final   Salmonella species NOT DETECTED NOT DETECTED Final   Serratia marcescens NOT DETECTED NOT DETECTED Final   Haemophilus influenzae NOT DETECTED NOT DETECTED Final   Neisseria meningitidis NOT DETECTED NOT DETECTED Final   Pseudomonas aeruginosa NOT DETECTED NOT DETECTED Final   Stenotrophomonas maltophilia NOT DETECTED NOT DETECTED Final   Candida albicans NOT DETECTED NOT DETECTED Final   Candida auris NOT DETECTED NOT DETECTED Final   Candida glabrata NOT DETECTED NOT DETECTED Final   Candida krusei NOT DETECTED NOT DETECTED Final   Candida parapsilosis NOT DETECTED NOT DETECTED Final   Candida tropicalis NOT DETECTED NOT DETECTED Final   Cryptococcus neoformans/gattii NOT DETECTED NOT DETECTED Final   CTX-M ESBL NOT DETECTED NOT DETECTED Final   Carbapenem resistance IMP NOT DETECTED NOT DETECTED Final   Carbapenem resistance KPC NOT DETECTED NOT DETECTED Final   Carbapenem resistance NDM NOT DETECTED NOT DETECTED Final   Carbapenem resist OXA 48 LIKE NOT DETECTED NOT DETECTED Final    Carbapenem resistance VIM NOT DETECTED NOT DETECTED Final    Comment: Performed at [facility], [address] [place], [place] [address]  Resp panel by RT-PCR (RSV, Flu A&B, Covid) Anterior Nasal Swab     Status: None   Collection Time: 11/21/22  6:10 PM   Specimen: Anterior Nasal Swab  Result Value Ref Range Status   SARS Coronavirus 2 by RT PCR NEGATIVE NEGATIVE Final    Comment: (NOTE) SARS-CoV-2 target nucleic acids are NOT DETECTED.  The SARS-CoV-2 RNA is generally detectable in upper respiratory specimens during the acute phase of infection. The lowest concentration of SARS-CoV-2 viral copies this assay can detect is 138 copies/mL. A negative result does not preclude SARS-Cov-2 infection and should not be used as the sole basis for treatment or other patient management decisions. A negative result may occur with  improper specimen collection/handling, submission of specimen other than nasopharyngeal swab, presence of viral mutation(s) within the areas targeted by this assay, and inadequate number of viral copies(<138 copies/mL). A negative result must be combined with clinical observations, patient history, and epidemiological information. The expected result is Negative.  Fact Sheet for Patients:  BloggerCourse.com  Fact Sheet for Healthcare Providers:  SeriousBroker.it  This test is no t yet approved or cleared by the [place] FDA and  has been authorized for detection and/or diagnosis of SARS-CoV-2 by FDA under an Emergency Use Authorization (EUA). This EUA will remain  in effect (meaning this test can be used) for the duration of the COVID-19 declaration under Section 564(b)(1) of the Act, 21 U.S.C.section 360bbb-3(b)(1), unless the authorization is terminated  or revoked sooner.       Influenza A by PCR NEGATIVE NEGATIVE Final   Influenza B by PCR NEGATIVE NEGATIVE Final    Comment: (NOTE) The  Xpert Xpress SARS-CoV-2/FLU/RSV plus assay is intended as an aid in the diagnosis of influenza from Nasopharyngeal swab specimens and should not be used as a sole basis for treatment. Nasal washings and aspirates are unacceptable for Xpert Xpress SARS-CoV-2/FLU/RSV testing.  Fact Sheet for Patients: BloggerCourse.com  Fact Sheet for Healthcare Providers: SeriousBroker.it  This test is not yet approved or cleared by the [place] FDA and has been authorized for detection and/or diagnosis of SARS-CoV-2 by FDA under an Emergency Use Authorization (  EUA). This EUA will remain in effect (meaning this test can be used) for the duration of the COVID-19 declaration under Section 564(b)(1) of the Act, 21 U.S.C. section 360bbb-3(b)(1), unless the authorization is terminated or revoked.     Resp Syncytial Virus by PCR NEGATIVE NEGATIVE Final    Comment: (NOTE) Fact Sheet for Patients: BloggerCourse.com  Fact Sheet for Healthcare Providers: SeriousBroker.it  This test is not yet approved or cleared by the [place] FDA and has been authorized for detection and/or diagnosis of SARS-CoV-2 by FDA under an Emergency Use Authorization (EUA). This EUA will remain in effect (meaning this test can be used) for the duration of the COVID-19 declaration under Section 564(b)(1) of the Act, 21 U.S.C. section 360bbb-3(b)(1), unless the authorization is terminated or revoked.  Performed at [facility], [address] [place], [place] [address]   MRSA Next Gen by PCR, Nasal     Status: None   Collection Time: 11/22/22  1:27 PM   Specimen: Nasal Mucosa; Nasal Swab  Result Value Ref Range Status   MRSA by PCR Next Gen NOT DETECTED NOT DETECTED Final    Comment: (NOTE) The GeneXpert MRSA Assay (FDA approved for NASAL specimens only), is one component of a comprehensive MRSA  colonization surveillance program. It is not intended to diagnose MRSA infection nor to guide or monitor treatment for MRSA infections. Test performance is not FDA approved in patients less than 16 years old. Performed at [facility], [address] [place], [place] [address]   Culture, blood (Routine X 2) w Reflex to ID Panel     Status: None (Preliminary result)   Collection Time: 11/24/22  9:35 AM   Specimen: BLOOD RIGHT HAND  Result Value Ref Range Status   Specimen Description BLOOD RIGHT HAND  Final   Special Requests   Final    BOTTLES DRAWN AEROBIC AND ANAEROBIC Blood Culture adequate volume   Culture   Final    NO GROWTH 1 DAY Performed at [facility], [address] [place], [place] [address]    Report Status PENDING  Incomplete  Culture, blood (Routine X 2) w Reflex to ID Panel     Status: None (Preliminary result)   Collection Time: 11/24/22  9:35 AM   Specimen: BLOOD RIGHT ARM  Result Value Ref Range Status   Specimen Description BLOOD RIGHT ARM  Final   Special Requests   Final    BOTTLES DRAWN AEROBIC AND ANAEROBIC Blood Culture adequate volume   Culture   Final    NO GROWTH 1 DAY Performed at [facility], [address] [place], [place] [address]    Report Status PENDING  Incomplete    Coagulation Studies: No results for input(s): "LABPROT", "INR" in the last 72 hours.   Urinalysis: No results for input(s): "COLORURINE", "LABSPEC", "PHURINE", "GLUCOSEU", "HGBUR", "BILIRUBINUR", "KETONESUR", "PROTEINUR", "UROBILINOGEN", "NITRITE", "LEUKOCYTESUR" in the last 72 hours.  Invalid input(s): "APPERANCEUR"     Imaging: No results found.   Medications:    anticoagulant sodium citrate     cefTRIAXone (ROCEPHIN)  IV 2 g (11/24/22 1508)    buPROPion  150 mg Oral Daily   calcitRIOL  0.25 mcg Oral Daily   Chlorhexidine Gluconate Cloth  6 each Topical Q0600   cholecalciferol  1,000 Units Oral Daily   docusate sodium  100 mg  Oral BID   epoetin (EPOGEN/PROCRIT) injection  10,000 Units Intravenous Q T,Th,Sa-HD   feeding supplement (NEPRO CARB STEADY)  237  mL Oral BID BM   lamoTRIgine  25 mg Oral [id number]   lithium carbonate  150 mg Oral Q supper   multivitamin  1 tablet Oral QHS   OLANZapine  10 mg Oral QHS   polyethylene glycol  17 g Oral Daily   sodium chloride flush  3 mL Intravenous Q12H   acetaminophen **OR** acetaminophen, alteplase, anticoagulant sodium citrate, feeding supplement (NEPRO CARB STEADY), heparin, lidocaine (PF), lidocaine-prilocaine, pentafluoroprop-tetrafluoroeth, traZODone  Assessment/ Plan:  77 y.o. male with bipolar disorder, recent admission for depression, end-stage renal disease, diabetes, hypertension, hyperlipidemia, gout, left retinal stroke, Crohn's disease, recurrent UTIs    admitted on 11/21/2022 for Fall, initial encounter [W19.XXXA] Fall at home, initial encounter [phone], Y92.009] Sepsis, due to unspecified organism, unspecified whether acute organ dysfunction present [facility]) [A41.9]  Outpatient dialysis: TTS/ Lt AVF  #End-stage renal disease on hemodialysis - Receiving treatment today, No UF - Next treatment scheduled for Saturday  #Anemia of chronic kidney disease  - Hemoglobin 7.3  - Continue low-dose Epogen with hemodialysis treatments.  #Sepsis   -Status post cystoscopy on 11/19/2022.  -Blood cultures are positive for Enterobacterales and E. Coli. Likely urinary source as u/a is abnormal.   -Continue ceftriaxone 2 g daily, per primary team.  -Supportive care for sepsis.    #Secondary hyperparathyroidism  - Calcium and phosphorus within desired range  # Constipation,  -Continue colace and miralax    LOS: 4 [facility] 9/5/202411:22 AM  [facility] [place], [place] [phone]

## 2022-11-25 NOTE — Discharge Summary (Signed)
Physician Discharge Summary   Patient: Luis Miller. MRN: [id number] DOB: 1945/08/04  Admit date:     11/21/2022  Discharge date: 11/25/22  Discharge Physician: Marrion Coy   PCP: Larae Grooms, NP   Recommendations at discharge:   Follow-up with PCP in 1 week. Check a CBC at her next dialysis.  Transfuse if hemoglobin less than 7.0.  Discharge Diagnoses: Principal Problem:   Severe sepsis (HCC) Active Problems:   Acute metabolic encephalopathy   Hypotension   Hypoglycemia   Rhabdomyolysis   ESRD on hemodialysis (HCC)   Obstructive sleep apnea   Gross hematuria   Anemia of chronic disease   Constipation   Raynaud's phenomenon   Overweight (BMI 25.0-29.9)   Acute pyelonephritis  Resolved Problems:   * No resolved hospital problems. *  Hospital Course: 77 year old man with history of end-stage renal disease on hemodialysis, anemia, bipolar disorder, sleep apnea, gout, hypertension coming in after a fall.  Patient found to have a fever.  Patient had a recent cystoscopy for hematuria. Positive for E. coli in blood cultures.  CT scan concerning for right pyelonephritis, no hydronephrosis.  Antibiotics switched over to Rocephin.    Transfer out of ICU on 9/3. Patient had E. coli septicemia, appear to be caused by pyelonephritis.   Patient has been seen by ID, decision made to transition to Levaquin at time of discharge.  Patient was taking 750 mg x1 dose tomorrow, followed with 250 mg daily x 6 days. Assessment and Plan:  * Severe sepsis (HCC) with E. coli bacteremia. Right pyelonephritis secondary to E. coli. Present on admission, with pyelonephritis with E. coli growing in blood cultures, fever, hypotension, leukocytosis, acute metabolic encephalopathy.  Antibiotics switched to Rocephin on 9/2.Marland Kitchen  Patient had recent cystoscopy. Condition seems to be stabilizing.  Repeat blood culture negative x 24 hours.  Source of infection most likely secondary to right  pyelonephritis, however, there is no urine culture sent out.  ID consult obtained.  Echocardiogram showed ejection fraction 60 to 65% without significant valvular disease. Antibiotics as above.   Elevated troponin secondary to mismatch demand and supply.   Likely due to sepsis.  Condition stable.   Acute metabolic encephalopathy Improved.   Hypotension Essential hypertension. Initial hypotension has resolved.  Discontinued midodrine. Patient blood pressure was also on hold, blood pressure noticeably elevated.  Not restarting ACE inhibitor and hydralazine.   Hypoglycemia Condition has improved after eating.   Traumatic rhabdomyolysis Fall with weakness. Patient will going to nursing home for rehab.   ESRD on hemodialysis (HCC) Hypokalemia. HD per nephrology.   Raynaud's phenomenon Of his hands, stable   Constipation Continue stool softeners.   Anemia of chronic disease Chronic thrombocytopenia. Hemoglobin trending down to 7.3 today, no active bleeding. Please check hemoglobin next dialysis, transfuse with hemoglobin less than 7.   Gross hematuria Had recent cystoscopy which did not show any etiology for hematuria.  Likely BPH.     Obstructive sleep apnea On CPAP at night  Consultants: ID Procedures performed: None  Disposition: Skilled nursing facility Diet recommendation:  Discharge Diet Orders (From admission, onward)     Start     Ordered   11/25/22 0000  Diet general       Comments: Dys 3 diet   11/25/22 1545           Dysphagia type 3 thin Liquid DISCHARGE MEDICATION: Allergies as of 11/25/2022       Reactions   Mirtazapine Rash   Aspirin  Other reaction(s): Unknown Patient was instructed not to take aspirin but can not remember why   Atomoxetine Other (See Comments)   Urinary sx   Strattera [atomoxetine Hcl] Other (See Comments)   Urinary sx   Xyzal [levocetirizine Dihydrochloride] Rash   Patient reported on 11/02/17        Medication  List     STOP taking these medications    benazepril 40 MG tablet Commonly known as: LOTENSIN   hydrALAZINE 100 MG tablet Commonly known as: APRESOLINE       TAKE these medications    buPROPion 150 MG 24 hr tablet Commonly known as: WELLBUTRIN XL Take 1 tablet (150 mg total) by mouth daily.   calcitRIOL 0.25 MCG capsule Commonly known as: ROCALTROL Take 0.25 mcg by mouth daily.   doxepin 100 MG capsule Commonly known as: SINEQUAN Take 1 capsule (100 mg total) by mouth at bedtime.   epoetin alfa 40981 UNIT/ML injection Commonly known as: EPOGEN Inject 1 mL (10,000 Units total) into the vein Every Tuesday,Thursday,and Saturday with dialysis. Start taking on: November 27, 2022   lactulose 10 GM/15ML solution Commonly known as: CHRONULAC Take 20 g by mouth daily.   lamoTRIgine 25 MG tablet Commonly known as: LAMICTAL Take 1 tablet (25 mg total) by mouth 2 (two) times daily at 8 am and 4 pm.   levofloxacin 250 MG tablet Commonly known as: Levaquin Take 3 tablets (750 mg total) by mouth daily for 1 day, THEN 1 tablet (250 mg total) daily for 6 days. Start taking on: November 26, 2022   lithium carbonate 150 MG capsule Take 1 capsule (150 mg total) by mouth daily with supper.   loperamide 2 MG capsule Commonly known as: IMODIUM Take 2 mg by mouth daily as needed for diarrhea or loose stools.   loratadine 10 MG tablet Commonly known as: CLARITIN TAKE 1 TABLET BY MOUTH EVERY DAY   multivitamin tablet Take 1 tablet by mouth daily.   OLANZapine 10 MG tablet Commonly known as: ZYPREXA Take 1 tablet (10 mg total) by mouth at bedtime.   rosuvastatin 10 MG tablet Commonly known as: CRESTOR TAKE 1 TABLET BY MOUTH EVERY DAY   traZODone 50 MG tablet Commonly known as: DESYREL Take 50 mg by mouth at bedtime as needed for sleep.   Vitamin D (Cholecalciferol) 25 MCG (1000 UT) Tabs Take 1,000 Units by mouth daily.        Contact information for follow-up  providers     Larae Grooms, NP Follow up in 1 week(s).   Specialty: Nurse Practitioner Contact information: [address] [place] [address] [phone]              Contact information for after-discharge care     Destination     [facility] Preferred SNF .   Service: Skilled Nursing Contact information: [address] [place] [address] [phone]                    Discharge Exam: Ceasar Mons Weights   11/25/22 0500 11/25/22 0906 11/25/22 1331  Weight: 82.2 kg 80.5 kg 78.4 kg   General exam: Appears calm and comfortable  Respiratory system: Clear to auscultation. Respiratory effort normal. Cardiovascular system: S1 & S2 heard, RRR. No JVD, murmurs, rubs, gallops or clicks. No pedal edema. Gastrointestinal system: Abdomen is nondistended, soft and nontender. No organomegaly or masses felt. Normal bowel sounds heard. Central nervous system: Alert and oriented x3. No focal  neurological deficits. Extremities: Symmetric 5 x 5 power. Skin: No rashes, lesions or ulcers Psychiatry: Judgement and insight appear normal. Mood & affect appropriate.    Condition at discharge: fair  The results of significant diagnostics from this hospitalization (including imaging, microbiology, ancillary and laboratory) are listed below for reference.   Imaging Studies: ECHOCARDIOGRAM COMPLETE  Result Date: 11/22/2022    ECHOCARDIOGRAM REPORT   Patient Name:   Yehuda Okubo. Date of Exam: 11/22/2022 Medical Rec #:  [id number]          Height:       66.0 in Accession #:    [id number]         Weight:       177.0 lb Date of Birth:  10/20/1945          BSA:          1.899 m Patient Age:    60 years           BP:           80/46 mmHg Patient Gender: M                  HR:           85 bpm. Exam Location:  [facility] Procedure: 2D Echo, Cardiac Doppler and Color Doppler Indications:    Syncope R55; Hypotension  History:        Patient has prior history  of Echocardiogram examinations, most                 recent 08/02/2019. Signs/Symptoms:Syncope; Risk                 Factors:Hypertension, Dyslipidemia, Sleep Apnea and Former                 Smoker.  Sonographer:    Dondra Prader RVT RCS Referring Phys: [id number] EKTA V PATEL IMPRESSIONS  1. Left ventricular ejection fraction, by estimation, is 60 to 65%. The left ventricle has normal function. The left ventricle has no regional wall motion abnormalities. Left ventricular diastolic parameters are consistent with Grade I diastolic dysfunction (impaired relaxation).  2. Right ventricular systolic function is normal. The right ventricular size is normal. There is normal pulmonary artery systolic pressure. The estimated right ventricular systolic pressure is 29.9 mmHg.  3. Right atrial size was severely dilated.  4. The mitral valve is normal in structure. No evidence of mitral valve regurgitation. No evidence of mitral stenosis.  5. The aortic valve is tricuspid. Aortic valve regurgitation is not visualized. Aortic valve sclerosis/calcification is present, without any evidence of aortic stenosis. Aortic valve area, by VTI measures 3.76 cm. Aortic valve mean gradient measures 6.0 mmHg. Aortic valve Vmax measures 1.60 m/s.  6. Aortic dilatation noted. There is mild dilatation of the ascending aorta, measuring 38 mm.  7. The inferior vena cava is dilated in size with <50% respiratory variability, suggesting right atrial pressure of 15 mmHg. FINDINGS  Left Ventricle: Left ventricular ejection fraction, by estimation, is 60 to 65%. The left ventricle has normal function. The left ventricle has no regional wall motion abnormalities. The left ventricular internal cavity size was normal in size. There is  no left ventricular hypertrophy. Left ventricular diastolic parameters are consistent with Grade I diastolic dysfunction (impaired relaxation). Normal left ventricular filling pressure. Right Ventricle: The right ventricular size  is normal. No increase in right ventricular wall thickness. Right ventricular systolic function is normal. There is normal pulmonary artery systolic pressure.  The tricuspid regurgitant velocity is 1.93 m/s, and  with an assumed right atrial pressure of 15 mmHg, the estimated right ventricular systolic pressure is 29.9 mmHg. Left Atrium: Left atrial size was normal in size. Right Atrium: Right atrial size was severely dilated. Pericardium: There is no evidence of pericardial effusion. Mitral Valve: The mitral valve is normal in structure. No evidence of mitral valve regurgitation. No evidence of mitral valve stenosis. Tricuspid Valve: The tricuspid valve is normal in structure. Tricuspid valve regurgitation is not demonstrated. No evidence of tricuspid stenosis. Aortic Valve: The aortic valve is tricuspid. Aortic valve regurgitation is not visualized. Aortic valve sclerosis/calcification is present, without any evidence of aortic stenosis. Aortic valve mean gradient measures 6.0 mmHg. Aortic valve peak gradient measures 10.2 mmHg. Aortic valve area, by VTI measures 3.76 cm. Pulmonic Valve: The pulmonic valve was normal in structure. Pulmonic valve regurgitation is not visualized. No evidence of pulmonic stenosis. Aorta: Aortic dilatation noted. There is mild dilatation of the ascending aorta, measuring 38 mm. Venous: The inferior vena cava is dilated in size with less than 50% respiratory variability, suggesting right atrial pressure of 15 mmHg. IAS/Shunts: No atrial level shunt detected by color flow Doppler.  LEFT VENTRICLE PLAX 2D LVIDd:         4.20 cm   Diastology LVIDs:         3.10 cm   LV e' medial:    7.83 cm/s LV PW:         1.20 cm   LV E/e' medial:  10.5 LV IVS:        1.00 cm   LV e' lateral:   11.40 cm/s LVOT diam:     2.20 cm   LV E/e' lateral: 7.2 LV SV:         105 LV SV Index:   55 LVOT Area:     3.80 cm  RIGHT VENTRICLE             IVC RV S prime:     17.20 cm/s  IVC diam: 2.20 cm TAPSE (M-mode):  2.8 cm LEFT ATRIUM             Index        RIGHT ATRIUM           Index LA diam:        3.50 cm 1.84 cm/m   RA Area:     23.70 cm LA Vol (A2C):   57.6 ml 30.34 ml/m  RA Volume:   86.80 ml  45.72 ml/m LA Vol (A4C):   54.3 ml 28.60 ml/m LA Biplane Vol: 57.0 ml 30.02 ml/m  AORTIC VALVE                     PULMONIC VALVE AV Area (Vmax):    3.87 cm      PV Vmax:       1.01 m/s AV Area (Vmean):   3.66 cm      PV Peak grad:  4.1 mmHg AV Area (VTI):     3.76 cm AV Vmax:           160.00 cm/s AV Vmean:          108.000 cm/s AV VTI:            0.279 m AV Peak Grad:      10.2 mmHg AV Mean Grad:      6.0 mmHg LVOT Vmax:         163.00  cm/s LVOT Vmean:        104.000 cm/s LVOT VTI:          0.276 m LVOT/AV VTI ratio: 0.99  AORTA Ao Root diam: 3.10 cm Ao Asc diam:  3.80 cm MITRAL VALVE                TRICUSPID VALVE MV Area (PHT): 3.68 cm     TR Peak grad:   14.9 mmHg MV Decel Time: 206 msec     TR Vmax:        193.00 cm/s MV E velocity: 82.50 cm/s MV A velocity: 109.00 cm/s  SHUNTS MV E/A ratio:  0.76         Systemic VTI:  0.28 m                             Systemic Diam: 2.20 cm Armanda Magic MD Electronically signed by Armanda Magic MD Signature Date/Time: 11/22/2022/2:43:46 PM    Final    CT Chest Wo Contrast  Result Date: 11/21/2022 CLINICAL DATA:  Sepsis.  Fall.  Altered mental status. EXAM: CT CHEST, ABDOMEN AND PELVIS WITHOUT CONTRAST TECHNIQUE: Multidetector CT imaging of the chest, abdomen and pelvis was performed following the standard protocol without IV contrast. RADIATION DOSE REDUCTION: This exam was performed according to the departmental dose-optimization program which includes automated exposure control, adjustment of the mA and/or kV according to patient size and/or use of iterative reconstruction technique. COMPARISON:  None Available. FINDINGS: CT CHEST FINDINGS Cardiovascular: Heart insert upper limits normal in size. Aorta normal caliber. Scattered coronary artery and aortic atherosclerosis.  Mediastinum/Nodes: No mediastinal, hilar, or axillary adenopathy. Trachea and esophagus are unremarkable. Thyroid unremarkable. Lungs/Pleura: Linear scarring or atelectasis in the lung bases. No effusions or pneumothorax. No suspicious pulmonary nodules. Musculoskeletal: Chest wall soft tissues are unremarkable. Multiple old healed bilateral rib fractures. Possible acute nondisplaced left lateral 5th rib fracture. Old healed right scapular fracture. CT ABDOMEN PELVIS FINDINGS Hepatobiliary: Small layering gallstones within the gallbladder. No focal hepatic abnormality or biliary ductal dilatation. Pancreas: No focal abnormality or ductal dilatation. Spleen: No focal abnormality.  Normal size. Adrenals/Urinary Tract: Normal adrenal glands. Large cyst off the upper pole of the right kidney measuring 6.3 cm, stable. Other smaller cysts. No follow-up imaging recommended. Mild right perinephric stranding. No hydronephrosis. Urinary bladder unremarkable with Foley catheter in place. Stomach/Bowel: Stomach, large and small bowel grossly unremarkable. Moderate stool burden throughout the colon. Few scattered colonic diverticula. Normal appendix. Vascular/Lymphatic: Aortoiliac atherosclerosis. No evidence of aneurysm or adenopathy. Reproductive: No visible focal abnormality. Penile implant in place. Other: No free fluid or free air. Musculoskeletal: No acute bony abnormality. IMPRESSION: No acute cardiopulmonary disease. Bibasilar atelectasis or scarring. Right perinephric stranding, new since prior study. No hydronephrosis or evidence of stones. Recommend clinical correlation to completely exclude infection/pyelonephritis. Cholelithiasis. Moderate stool burden with scattered colonic diverticula. Electronically Signed   By: Charlett Nose M.D.   On: 11/21/2022 21:04   CT ABDOMEN PELVIS WO CONTRAST  Result Date: 11/21/2022 CLINICAL DATA:  Sepsis.  Fall.  Altered mental status. EXAM: CT CHEST, ABDOMEN AND PELVIS WITHOUT  CONTRAST TECHNIQUE: Multidetector CT imaging of the chest, abdomen and pelvis was performed following the standard protocol without IV contrast. RADIATION DOSE REDUCTION: This exam was performed according to the departmental dose-optimization program which includes automated exposure control, adjustment of the mA and/or kV according to patient size and/or use of iterative reconstruction technique. COMPARISON:  None Available. FINDINGS: CT CHEST FINDINGS Cardiovascular: Heart insert upper limits normal in size. Aorta normal caliber. Scattered coronary artery and aortic atherosclerosis. Mediastinum/Nodes: No mediastinal, hilar, or axillary adenopathy. Trachea and esophagus are unremarkable. Thyroid unremarkable. Lungs/Pleura: Linear scarring or atelectasis in the lung bases. No effusions or pneumothorax. No suspicious pulmonary nodules. Musculoskeletal: Chest wall soft tissues are unremarkable. Multiple old healed bilateral rib fractures. Possible acute nondisplaced left lateral 5th rib fracture. Old healed right scapular fracture. CT ABDOMEN PELVIS FINDINGS Hepatobiliary: Small layering gallstones within the gallbladder. No focal hepatic abnormality or biliary ductal dilatation. Pancreas: No focal abnormality or ductal dilatation. Spleen: No focal abnormality.  Normal size. Adrenals/Urinary Tract: Normal adrenal glands. Large cyst off the upper pole of the right kidney measuring 6.3 cm, stable. Other smaller cysts. No follow-up imaging recommended. Mild right perinephric stranding. No hydronephrosis. Urinary bladder unremarkable with Foley catheter in place. Stomach/Bowel: Stomach, large and small bowel grossly unremarkable. Moderate stool burden throughout the colon. Few scattered colonic diverticula. Normal appendix. Vascular/Lymphatic: Aortoiliac atherosclerosis. No evidence of aneurysm or adenopathy. Reproductive: No visible focal abnormality. Penile implant in place. Other: No free fluid or free air.  Musculoskeletal: No acute bony abnormality. IMPRESSION: No acute cardiopulmonary disease. Bibasilar atelectasis or scarring. Right perinephric stranding, new since prior study. No hydronephrosis or evidence of stones. Recommend clinical correlation to completely exclude infection/pyelonephritis. Cholelithiasis. Moderate stool burden with scattered colonic diverticula. Electronically Signed   By: Charlett Nose M.D.   On: 11/21/2022 21:04   CT Head Wo Contrast  Result Date: 11/21/2022 CLINICAL DATA:  Head trauma, moderate-severe AMS, fall. EXAM: CT HEAD WITHOUT CONTRAST TECHNIQUE: Contiguous axial images were obtained from the base of the skull through the vertex without intravenous contrast. RADIATION DOSE REDUCTION: This exam was performed according to the departmental dose-optimization program which includes automated exposure control, adjustment of the mA and/or kV according to patient size and/or use of iterative reconstruction technique. COMPARISON:  03/30/2006 FINDINGS: Brain: There is atrophy and chronic small vessel disease changes. No acute intracranial abnormality. Specifically, no hemorrhage, hydrocephalus, mass lesion, acute infarction, or significant intracranial injury. Vascular: No hyperdense vessel or unexpected calcification. Skull: No acute calvarial abnormality. Sinuses/Orbits: No acute findings Other: None IMPRESSION: Atrophy, chronic microvascular disease. No acute intracranial abnormality. Electronically Signed   By: Charlett Nose M.D.   On: 11/21/2022 20:56   DG Chest Port 1 View  Result Date: 11/21/2022 CLINICAL DATA:  Dizziness.  Fall. EXAM: PORTABLE CHEST 1 VIEW COMPARISON:  09/27/2022 FINDINGS: Remote bilateral rib fractures. The left-sided fractures are new since the prior plain film. Midline trachea. Mild cardiomegaly. Tortuous thoracic aorta. No pleural effusion or pneumothorax. Clear lungs. No congestive failure. IMPRESSION: 1. No acute cardiopulmonary disease. 2. Cardiomegaly  without congestive failure. Electronically Signed   By: Jeronimo Greaves M.D.   On: 11/21/2022 18:44   VAS US DUPLEX DIALYSIS ACCESS (AVF,AVG)  Result Date: 11/03/2022 DIALYSIS ACCESS Patient Name:  Philo Glessner.  Date of Exam:   11/01/2022 Medical Rec #: [id number]           Accession #:    [id number] Date of Birth: [birth year]           Patient Gender: M Patient Age:   65 years Exam Location:  [facility] Procedure:      VAS US DUPLEX DIALYSIS ACCESS (AVF, AVG) Referring Phys: Festus Barren --------------------------------------------------------------------------------  Access Site: Left Upper Extremity. Access Type: Brachial Axillary AVG. History: Created 11/05/2021  09/21/2022: PTA and Stent placement Venous Outflow Left Brachial          Axillary AVG peripheral segment. Comparison Study: 03/12/2022 Performing Technologist: Debbe Bales RVS  Examination Guidelines: A complete evaluation includes B-mode imaging, spectral Doppler, color Doppler, and power Doppler as needed of all accessible portions of each vessel. Unilateral testing is considered an integral part of a complete examination. Limited examinations for reoccurring indications may be performed as noted.  Findings:   +--------------------+----------+-----------------+--------+ AVG                 PSV (cm/s)Flow Vol (mL/min)Describe +--------------------+----------+-----------------+--------+ Native artery inflow   503          1332                +--------------------+----------+-----------------+--------+ Arterial anastomosis   744                              +--------------------+----------+-----------------+--------+ Prox graft             546                              +--------------------+----------+-----------------+--------+ Mid graft              400                              +--------------------+----------+-----------------+--------+ Distal graft           326                               +--------------------+----------+-----------------+--------+ Venous anastomosis     117                              +--------------------+----------+-----------------+--------+ Venous outflow         127                              +--------------------+----------+-----------------+--------+ +--------------+-------------+---------+---------+---------+-------------------+               Diameter (cm)  Depth  Branching   PSV       Flow Volume                                  (cm)             (cm/s)       (ml/min)       +--------------+-------------+---------+---------+---------+-------------------+ Lt Rad Art                                      70                        Dist                                                                      +--------------+-------------+---------+---------+---------+-------------------+  Summary: The Left Brachial Axillary AVG appears to be patent throughout; Flow Volume appears to be Normal as well.  *See table(s) above for measurements and observations.  Diagnosing physician: Festus Barren MD Electronically signed by Festus Barren MD on 11/03/2022 at 3:18:30 PM.   --------------------------------------------------------------------------------   Final     Microbiology: Results for orders placed or performed during the hospital encounter of 11/21/22  Blood Culture (routine x 2)     Status: Abnormal   Collection Time: 11/21/22  5:11 PM   Specimen: BLOOD  Result Value Ref Range Status   Specimen Description   Final    BLOOD RIGHT ANTECUBITAL Performed at [facility], [address], [place], [place] [address]    Special Requests   Final    BOTTLES DRAWN AEROBIC AND ANAEROBIC Blood Culture adequate volume Performed at [facility], [address], [place], [place] [address]    Culture  Setup Time   Final    GRAM NEGATIVE RODS IN BOTH AEROBIC AND ANAEROBIC BOTTLES CRITICAL RESULT CALLED TO, READ BACK BY AND  VERIFIED WITH: Dawayne Cirri [id number] 11/22/22 SLM Performed at [facility], [address] [place], [place] [address]    Culture ESCHERICHIA COLI (A)  Final   Report Status 11/24/2022 FINAL  Final   Organism ID, Bacteria ESCHERICHIA COLI  Final      Susceptibility   Escherichia coli - MIC*    AMPICILLIN >=32 RESISTANT Resistant     CEFEPIME <=0.12 SENSITIVE Sensitive     CEFTAZIDIME <=1 SENSITIVE Sensitive     CEFTRIAXONE <=0.25 SENSITIVE Sensitive     CIPROFLOXACIN <=0.25 SENSITIVE Sensitive     GENTAMICIN <=1 SENSITIVE Sensitive     IMIPENEM <=0.25 SENSITIVE Sensitive     TRIMETH/SULFA >=320 RESISTANT Resistant     AMPICILLIN/SULBACTAM >=32 RESISTANT Resistant     PIP/TAZO 32 INTERMEDIATE Intermediate     * ESCHERICHIA COLI  Blood Culture (routine x 2)     Status: Abnormal   Collection Time: 11/21/22  5:11 PM   Specimen: BLOOD  Result Value Ref Range Status   Specimen Description   Final    BLOOD BLOOD RIGHT FOREARM Performed at [facility], [address] [place], [place] [address]    Special Requests   Final    BOTTLES DRAWN AEROBIC AND ANAEROBIC Blood Culture adequate volume Performed at [facility], [address] [place], [place] [address]    Culture  Setup Time   Final    GRAM NEGATIVE RODS IN BOTH AEROBIC AND ANAEROBIC BOTTLES CRITICAL VALUE NOTED.  VALUE IS CONSISTENT WITH PREVIOUSLY REPORTED AND CALLED VALUE. Performed at [facility], [address] [place], [place] [address]    Culture (A)  Final    ESCHERICHIA COLI SUSCEPTIBILITIES PERFORMED ON PREVIOUS CULTURE WITHIN THE LAST 5 DAYS. Performed at [facility], [address], [place], [place] [address]    Report Status 11/24/2022 FINAL  Final  Blood Culture ID Panel (Reflexed)     Status: Abnormal   Collection Time: 11/21/22  5:11 PM  Result Value Ref Range Status   Enterococcus faecalis NOT DETECTED NOT DETECTED Final   Enterococcus Faecium NOT DETECTED NOT  DETECTED Final   Listeria monocytogenes NOT DETECTED NOT DETECTED Final   Staphylococcus species NOT DETECTED NOT DETECTED Final   Staphylococcus aureus (BCID) NOT DETECTED NOT DETECTED Final   Staphylococcus epidermidis NOT DETECTED NOT DETECTED Final   Staphylococcus lugdunensis NOT DETECTED NOT DETECTED Final  Streptococcus species NOT DETECTED NOT DETECTED Final   Streptococcus agalactiae NOT DETECTED NOT DETECTED Final   Streptococcus pneumoniae NOT DETECTED NOT DETECTED Final   Streptococcus pyogenes NOT DETECTED NOT DETECTED Final   A.calcoaceticus-baumannii NOT DETECTED NOT DETECTED Final   Bacteroides fragilis NOT DETECTED NOT DETECTED Final   Enterobacterales DETECTED (A) NOT DETECTED Final    Comment: Enterobacterales represent a large order of gram negative bacteria, not a single organism. CRITICAL RESULT CALLED TO, READ BACK BY AND VERIFIED WITH: NATHAN BELUE 0617 11/22/22 [facility]    Enterobacter cloacae complex NOT DETECTED NOT DETECTED Final   Escherichia coli DETECTED (A) NOT DETECTED Final    Comment: CRITICAL RESULT CALLED TO, READ BACK BY AND VERIFIED WITH: NATHAN BELUE 0617 11/22/22 [facility]    Klebsiella aerogenes NOT DETECTED NOT DETECTED Final   Klebsiella oxytoca NOT DETECTED NOT DETECTED Final   Klebsiella pneumoniae NOT DETECTED NOT DETECTED Final   Proteus species NOT DETECTED NOT DETECTED Final   Salmonella species NOT DETECTED NOT DETECTED Final   Serratia marcescens NOT DETECTED NOT DETECTED Final   Haemophilus influenzae NOT DETECTED NOT DETECTED Final   Neisseria meningitidis NOT DETECTED NOT DETECTED Final   Pseudomonas aeruginosa NOT DETECTED NOT DETECTED Final   Stenotrophomonas maltophilia NOT DETECTED NOT DETECTED Final   Candida albicans NOT DETECTED NOT DETECTED Final   Candida auris NOT DETECTED NOT DETECTED Final   Candida glabrata NOT DETECTED NOT DETECTED Final   Candida krusei NOT DETECTED NOT DETECTED Final   Candida parapsilosis NOT DETECTED NOT  DETECTED Final   Candida tropicalis NOT DETECTED NOT DETECTED Final   Cryptococcus neoformans/gattii NOT DETECTED NOT DETECTED Final   CTX-M ESBL NOT DETECTED NOT DETECTED Final   Carbapenem resistance IMP NOT DETECTED NOT DETECTED Final   Carbapenem resistance KPC NOT DETECTED NOT DETECTED Final   Carbapenem resistance NDM NOT DETECTED NOT DETECTED Final   Carbapenem resist OXA 48 LIKE NOT DETECTED NOT DETECTED Final   Carbapenem resistance VIM NOT DETECTED NOT DETECTED Final    Comment: Performed at [facility], [address] [place], [place] [address]  Resp panel by RT-PCR (RSV, Flu A&B, Covid) Anterior Nasal Swab     Status: None   Collection Time: 11/21/22  6:10 PM   Specimen: Anterior Nasal Swab  Result Value Ref Range Status   SARS Coronavirus 2 by RT PCR NEGATIVE NEGATIVE Final    Comment: (NOTE) SARS-CoV-2 target nucleic acids are NOT DETECTED.  The SARS-CoV-2 RNA is generally detectable in upper respiratory specimens during the acute phase of infection. The lowest concentration of SARS-CoV-2 viral copies this assay can detect is 138 copies/mL. A negative result does not preclude SARS-Cov-2 infection and should not be used as the sole basis for treatment or other patient management decisions. A negative result may occur with  improper specimen collection/handling, submission of specimen other than nasopharyngeal swab, presence of viral mutation(s) within the areas targeted by this assay, and inadequate number of viral copies(<138 copies/mL). A negative result must be combined with clinical observations, patient history, and epidemiological information. The expected result is Negative.  Fact Sheet for Patients:  BloggerCourse.com  Fact Sheet for Healthcare Providers:  SeriousBroker.it  This test is no t yet approved or cleared by the [place] FDA and  has been authorized for detection and/or diagnosis of  SARS-CoV-2 by FDA under an Emergency Use Authorization (EUA). This EUA will remain  in effect (meaning this test can be used) for the duration of the  COVID-19 declaration under Section 564(b)(1) of the Act, 21 U.S.C.section 360bbb-3(b)(1), unless the authorization is terminated  or revoked sooner.       Influenza A by PCR NEGATIVE NEGATIVE Final   Influenza B by PCR NEGATIVE NEGATIVE Final    Comment: (NOTE) The Xpert Xpress SARS-CoV-2/FLU/RSV plus assay is intended as an aid in the diagnosis of influenza from Nasopharyngeal swab specimens and should not be used as a sole basis for treatment. Nasal washings and aspirates are unacceptable for Xpert Xpress SARS-CoV-2/FLU/RSV testing.  Fact Sheet for Patients: BloggerCourse.com  Fact Sheet for Healthcare Providers: SeriousBroker.it  This test is not yet approved or cleared by the [place] FDA and has been authorized for detection and/or diagnosis of SARS-CoV-2 by FDA under an Emergency Use Authorization (EUA). This EUA will remain in effect (meaning this test can be used) for the duration of the COVID-19 declaration under Section 564(b)(1) of the Act, 21 U.S.C. section 360bbb-3(b)(1), unless the authorization is terminated or revoked.     Resp Syncytial Virus by PCR NEGATIVE NEGATIVE Final    Comment: (NOTE) Fact Sheet for Patients: BloggerCourse.com  Fact Sheet for Healthcare Providers: SeriousBroker.it  This test is not yet approved or cleared by the [place] FDA and has been authorized for detection and/or diagnosis of SARS-CoV-2 by FDA under an Emergency Use Authorization (EUA). This EUA will remain in effect (meaning this test can be used) for the duration of the COVID-19 declaration under Section 564(b)(1) of the Act, 21 U.S.C. section 360bbb-3(b)(1), unless the authorization is terminated  or revoked.  Performed at [facility], [address] [place], [place] [address]   MRSA Next Gen by PCR, Nasal     Status: None   Collection Time: 11/22/22  1:27 PM   Specimen: Nasal Mucosa; Nasal Swab  Result Value Ref Range Status   MRSA by PCR Next Gen NOT DETECTED NOT DETECTED Final    Comment: (NOTE) The GeneXpert MRSA Assay (FDA approved for NASAL specimens only), is one component of a comprehensive MRSA colonization surveillance program. It is not intended to diagnose MRSA infection nor to guide or monitor treatment for MRSA infections. Test performance is not FDA approved in patients less than 63 years old. Performed at [facility] [address] [place], [place] [address]   Culture, blood (Routine X 2) w Reflex to ID Panel     Status: None (Preliminary result)   Collection Time: 11/24/22  9:35 AM   Specimen: BLOOD RIGHT HAND  Result Value Ref Range Status   Specimen Description BLOOD RIGHT HAND  Final   Special Requests   Final    BOTTLES DRAWN AEROBIC AND ANAEROBIC Blood Culture adequate volume   Culture   Final    NO GROWTH 1 DAY Performed at [facility], [address], [place], [place] [address]    Report Status PENDING  Incomplete  Culture, blood (Routine X 2) w Reflex to ID Panel     Status: None (Preliminary result)   Collection Time: 11/24/22  9:35 AM   Specimen: BLOOD RIGHT ARM  Result Value Ref Range Status   Specimen Description BLOOD RIGHT ARM  Final   Special Requests   Final    BOTTLES DRAWN AEROBIC AND ANAEROBIC Blood Culture adequate volume   Culture   Final    NO GROWTH 1 DAY Performed at [facility], [address], [place], [place] [address]    Report Status PENDING  Incomplete    Labs:  CBC: Recent Labs  Lab 11/21/22 1710 11/23/22 0914 11/23/22 1419 11/24/22 0605 11/25/22 0632 11/25/22 0910  WBC 11.0* 7.2 7.6 5.7 4.7 4.8  NEUTROABS 9.9*  --   --   --   --   --   HGB 9.4* 8.0* 8.5* 7.6*  7.5* 7.3*  HCT 28.5* 23.9* 24.9* 22.3* 22.3* 22.0*  MCV 104.8* 103.9* 100.0 100.9* 100.9* 102.3*  PLT 110* 94* 105* 104* 104* 103*   Basic Metabolic Panel: Recent Labs  Lab 11/21/22 1710 11/23/22 0914 11/23/22 1419 11/24/22 0605 11/25/22 0910  NA 138 138 137 136 136  K 3.6 3.5 3.4* 3.6 3.4*  CL 94* 103 97* 100 99  CO2 28 23 30 28 26   GLUCOSE 117* 210* 119* 129* 117*  BUN 41* 59* 23 39* 61*  CREATININE 6.33* 7.93* 3.53* 4.92* 5.72*  CALCIUM 9.2 8.4* 8.4* 8.3* 8.6*  PHOS  --   --  2.4*  --  3.7   Liver Function Tests: Recent Labs  Lab 11/21/22 1710 11/23/22 1419 11/25/22 0910  AST 129*  --   --   ALT 32  --   --   ALKPHOS 88  --   --   BILITOT 1.1  --   --   PROT 7.5  --   --   ALBUMIN 3.8 2.8* 2.6*   CBG: Recent Labs  Lab 11/23/22 1623 11/23/22 2149 11/24/22 0721 11/24/22 1114 11/24/22 1439  GLUCAP 89 125* 118* 149* 188*    Discharge time spent: greater than 30 minutes.  Signed: Marrion Coy, MD [facility] 11/25/2022

## 2022-11-26 DIAGNOSIS — R1311 Dysphagia, oral phase: Secondary | ICD-10-CM | POA: Diagnosis not present

## 2022-11-26 DIAGNOSIS — Z466 Encounter for fitting and adjustment of urinary device: Secondary | ICD-10-CM | POA: Diagnosis not present

## 2022-11-26 DIAGNOSIS — J309 Allergic rhinitis, unspecified: Secondary | ICD-10-CM | POA: Diagnosis not present

## 2022-11-26 DIAGNOSIS — I10 Essential (primary) hypertension: Secondary | ICD-10-CM | POA: Diagnosis not present

## 2022-11-26 DIAGNOSIS — R2681 Unsteadiness on feet: Secondary | ICD-10-CM | POA: Diagnosis not present

## 2022-11-26 DIAGNOSIS — R197 Diarrhea, unspecified: Secondary | ICD-10-CM | POA: Diagnosis not present

## 2022-11-26 DIAGNOSIS — R310 Gross hematuria: Secondary | ICD-10-CM | POA: Diagnosis not present

## 2022-11-26 DIAGNOSIS — N111 Chronic obstructive pyelonephritis: Secondary | ICD-10-CM | POA: Diagnosis not present

## 2022-11-26 DIAGNOSIS — N2581 Secondary hyperparathyroidism of renal origin: Secondary | ICD-10-CM | POA: Diagnosis not present

## 2022-11-26 DIAGNOSIS — T82868A Thrombosis of vascular prosthetic devices, implants and grafts, initial encounter: Secondary | ICD-10-CM | POA: Diagnosis not present

## 2022-11-26 DIAGNOSIS — N186 End stage renal disease: Secondary | ICD-10-CM | POA: Diagnosis not present

## 2022-11-26 DIAGNOSIS — I7300 Raynaud's syndrome without gangrene: Secondary | ICD-10-CM | POA: Diagnosis not present

## 2022-11-26 DIAGNOSIS — G4089 Other seizures: Secondary | ICD-10-CM | POA: Diagnosis not present

## 2022-11-26 DIAGNOSIS — Z538 Procedure and treatment not carried out for other reasons: Secondary | ICD-10-CM | POA: Diagnosis not present

## 2022-11-26 DIAGNOSIS — D509 Iron deficiency anemia, unspecified: Secondary | ICD-10-CM | POA: Diagnosis not present

## 2022-11-26 DIAGNOSIS — E569 Vitamin deficiency, unspecified: Secondary | ICD-10-CM | POA: Diagnosis not present

## 2022-11-26 DIAGNOSIS — N250 Renal osteodystrophy: Secondary | ICD-10-CM | POA: Diagnosis not present

## 2022-11-26 DIAGNOSIS — M6281 Muscle weakness (generalized): Secondary | ICD-10-CM | POA: Diagnosis not present

## 2022-11-26 DIAGNOSIS — R6520 Severe sepsis without septic shock: Secondary | ICD-10-CM | POA: Diagnosis not present

## 2022-11-26 DIAGNOSIS — G9341 Metabolic encephalopathy: Secondary | ICD-10-CM | POA: Diagnosis not present

## 2022-11-26 DIAGNOSIS — F319 Bipolar disorder, unspecified: Secondary | ICD-10-CM | POA: Diagnosis not present

## 2022-11-26 DIAGNOSIS — E559 Vitamin D deficiency, unspecified: Secondary | ICD-10-CM | POA: Diagnosis not present

## 2022-11-26 DIAGNOSIS — Y841 Kidney dialysis as the cause of abnormal reaction of the patient, or of later complication, without mention of misadventure at the time of the procedure: Secondary | ICD-10-CM | POA: Diagnosis not present

## 2022-11-26 DIAGNOSIS — F329 Major depressive disorder, single episode, unspecified: Secondary | ICD-10-CM | POA: Diagnosis not present

## 2022-11-26 DIAGNOSIS — M6259 Muscle wasting and atrophy, not elsewhere classified, multiple sites: Secondary | ICD-10-CM | POA: Diagnosis not present

## 2022-11-26 DIAGNOSIS — R6521 Severe sepsis with septic shock: Secondary | ICD-10-CM | POA: Diagnosis not present

## 2022-11-26 DIAGNOSIS — A419 Sepsis, unspecified organism: Secondary | ICD-10-CM | POA: Diagnosis not present

## 2022-11-26 DIAGNOSIS — N401 Enlarged prostate with lower urinary tract symptoms: Secondary | ICD-10-CM | POA: Diagnosis not present

## 2022-11-26 DIAGNOSIS — G4700 Insomnia, unspecified: Secondary | ICD-10-CM | POA: Diagnosis not present

## 2022-11-26 DIAGNOSIS — D649 Anemia, unspecified: Secondary | ICD-10-CM | POA: Diagnosis not present

## 2022-11-26 DIAGNOSIS — R41841 Cognitive communication deficit: Secondary | ICD-10-CM | POA: Diagnosis not present

## 2022-11-26 DIAGNOSIS — R7889 Finding of other specified substances, not normally found in blood: Secondary | ICD-10-CM | POA: Diagnosis not present

## 2022-11-26 DIAGNOSIS — D631 Anemia in chronic kidney disease: Secondary | ICD-10-CM | POA: Diagnosis not present

## 2022-11-26 DIAGNOSIS — Z992 Dependence on renal dialysis: Secondary | ICD-10-CM | POA: Diagnosis not present

## 2022-11-26 DIAGNOSIS — F3289 Other specified depressive episodes: Secondary | ICD-10-CM | POA: Diagnosis not present

## 2022-11-26 DIAGNOSIS — J99 Respiratory disorders in diseases classified elsewhere: Secondary | ICD-10-CM | POA: Diagnosis not present

## 2022-11-26 DIAGNOSIS — E785 Hyperlipidemia, unspecified: Secondary | ICD-10-CM | POA: Diagnosis not present

## 2022-11-26 LAB — CBC
HCT: 22.9 % — ABNORMAL LOW (ref 39.0–52.0)
Hemoglobin: 7.7 g/dL — ABNORMAL LOW (ref 13.0–17.0)
MCH: 34.2 pg — ABNORMAL HIGH (ref 26.0–34.0)
MCHC: 33.6 g/dL (ref 30.0–36.0)
MCV: 101.8 fL — ABNORMAL HIGH (ref 80.0–100.0)
Platelets: 120 10*3/uL — ABNORMAL LOW (ref 150–400)
RBC: 2.25 MIL/uL — ABNORMAL LOW (ref 4.22–5.81)
RDW: 13.2 % (ref 11.5–15.5)
WBC: 4.5 10*3/uL (ref 4.0–10.5)
nRBC: 0 % (ref 0.0–0.2)

## 2022-11-26 MED ORDER — LEVOFLOXACIN 250 MG PO TABS: 250.0000 mg | ORAL_TABLET | Freq: Every day | ORAL | Status: AC

## 2022-11-26 MED ORDER — LITHIUM CARBONATE 150 MG PO CAPS: 150.0000 mg | ORAL_CAPSULE | Freq: Every day | ORAL | 0 refills | Status: DC

## 2022-11-26 NOTE — Plan of Care (Signed)
Patient is adequate for discharge, resolving care plan.  Luis Miller

## 2022-11-26 NOTE — Progress Notes (Signed)
Mobility Specialist - Progress Note   11/26/22 1017  Mobility  Activity Ambulated with assistance in room;Ambulated with assistance in hallway  Level of Assistance Standby assist, set-up cues, supervision of patient - no hands on  Assistive Device Front wheel walker  Distance Ambulated (ft) 160 ft  Activity Response Tolerated well  $Mobility charge 1 Mobility  Mobility Specialist Start Time (ACUTE ONLY) 1008  Mobility Specialist Stop Time (ACUTE ONLY) 1016  Mobility Specialist Time Calculation (min) (ACUTE ONLY) 8 min   Pt supine upon entry, utilizing RA. Pt agreeable to OOB amb this date. Pt completed bed mob indep, STS to RW ModI and amb one lap around the NS with supervision. Pt returned to the room, left supine with alarm set and needs within reach. RN notified.  Zetta Bills Mobility Specialist 11/26/22 10:20 AM

## 2022-11-26 NOTE — Progress Notes (Signed)
[facility] [place], [place] 11/26/22  Subjective:   Hospital day # 5  Patient is a 77 year old male with end-stage renal disease on hemodialysis, hypertension, gout, depression, history of melanoma, Crohn's disease, history of stroke, bipolar disorder.  He was recently hospitalized at [facility] behavioral health for severe bipolar 1 disorder with recurrent depression.  Discharged on 10/05/2022.  Patient had a cystoscopy on 11/19/2022.  No bladder mucosal lesions were seen.  Etiology of hematuria was thought to be most likely BPH.  Patient sitting up in bed Alert and oriented Possible discharge later today.  Voices concerns of discharging with foley catheter in place   Objective:  Vital signs in last 24 hours:  Temp:  [97.6 F (36.4 C)-99.1 F (37.3 C)] 98.4 F (36.9 C) (09/06 0831) Pulse Rate:  [70-78] 78 (09/06 0831) Resp:  [16-18] 16 (09/06 0831) BP: (119-138)/(67-76) 131/73 (09/06 0831) SpO2:  [96 %-100 %] 97 % (09/06 0831) Weight:  [78.7 kg] 78.7 kg (09/06 0500)  Weight change: -1.7 kg Filed Weights   11/25/22 0906 11/25/22 1331 11/26/22 0500  Weight: 80.5 kg 78.4 kg 78.7 kg    Intake/Output:    Intake/Output Summary (Last 24 hours) at 11/26/2022 1514 Last data filed at 11/26/2022 0800 Gross per 24 hour  Intake 691.03 ml  Output 850 ml  Net -158.97 ml     Physical Exam: General: NAD  HEENT Moist oral mucous membranes  Pulm/lungs Normal breathing effort, clear to auscultation  CVS/Heart Regular, no rub  Abdomen:  Soft, nontender, nondistended  Extremities: No peripheral edema  Neurologic: Alert, able to answer simple questions.    Skin: No acute rashes  Access: Lt AVF  GU  Foley catheter    Basic Metabolic Panel:  Recent Labs  Lab 11/21/22 1710 11/23/22 0914 11/23/22 1419 11/24/22 0605 11/25/22 0910  NA 138 138 137 136 136  K 3.6 3.5 3.4* 3.6 3.4*  CL 94* 103 97* 100 99  CO2 28 23 30 28 26   GLUCOSE 117* 210* 119* 129* 117*  BUN  41* 59* 23 39* 61*  CREATININE 6.33* 7.93* 3.53* 4.92* 5.72*  CALCIUM 9.2 8.4* 8.4* 8.3* 8.6*  PHOS  --   --  2.4*  --  3.7     CBC: Recent Labs  Lab 11/21/22 1710 11/23/22 0914 11/23/22 1419 11/24/22 0605 11/25/22 0632 11/25/22 0910 11/26/22 0630  WBC 11.0*   < > 7.6 5.7 4.7 4.8 4.5  NEUTROABS 9.9*  --   --   --   --   --   --   HGB 9.4*   < > 8.5* 7.6* 7.5* 7.3* 7.7*  HCT 28.5*   < > 24.9* 22.3* 22.3* 22.0* 22.9*  MCV 104.8*   < > 100.0 100.9* 100.9* 102.3* 101.8*  PLT 110*   < > 105* 104* 104* 103* 120*   < > = values in this interval not displayed.      Lab Results  Component Value Date   HEPBSAG NON REACTIVE 11/22/2022   HEPBSAB NON REACTIVE 08/02/2019      Microbiology:  Recent Results (from the past 240 hour(s))  Blood Culture (routine x 2)     Status: Abnormal   Collection Time: 11/21/22  5:11 PM   Specimen: BLOOD  Result Value Ref Range Status   Specimen Description   Final    BLOOD RIGHT ANTECUBITAL Performed at [facility], [address], [place], [place] [address]    Special Requests   Final  BOTTLES DRAWN AEROBIC AND ANAEROBIC Blood Culture adequate volume Performed at [facility], [address] [place], [place] [address]    Culture  Setup Time   Final    GRAM NEGATIVE RODS IN BOTH AEROBIC AND ANAEROBIC BOTTLES CRITICAL RESULT CALLED TO, READ BACK BY AND VERIFIED WITH: Dawayne Cirri 6045 11/22/22 SLM Performed at [facility], [address] [place], [place] [address]    Culture ESCHERICHIA COLI (A)  Final   Report Status 11/24/2022 FINAL  Final   Organism ID, Bacteria ESCHERICHIA COLI  Final      Susceptibility   Escherichia coli - MIC*    AMPICILLIN >=32 RESISTANT Resistant     CEFEPIME <=0.12 SENSITIVE Sensitive     CEFTAZIDIME <=1 SENSITIVE Sensitive     CEFTRIAXONE <=0.25 SENSITIVE Sensitive     CIPROFLOXACIN <=0.25 SENSITIVE Sensitive     GENTAMICIN <=1 SENSITIVE Sensitive     IMIPENEM <=0.25  SENSITIVE Sensitive     TRIMETH/SULFA >=320 RESISTANT Resistant     AMPICILLIN/SULBACTAM >=32 RESISTANT Resistant     PIP/TAZO 32 INTERMEDIATE Intermediate     * ESCHERICHIA COLI  Blood Culture (routine x 2)     Status: Abnormal   Collection Time: 11/21/22  5:11 PM   Specimen: BLOOD  Result Value Ref Range Status   Specimen Description   Final    BLOOD BLOOD RIGHT FOREARM Performed at [facility], [address], [place], [place] [address]    Special Requests   Final    BOTTLES DRAWN AEROBIC AND ANAEROBIC Blood Culture adequate volume Performed at [facility], [address] [place], [place] [address]    Culture  Setup Time   Final    GRAM NEGATIVE RODS IN BOTH AEROBIC AND ANAEROBIC BOTTLES CRITICAL VALUE NOTED.  VALUE IS CONSISTENT WITH PREVIOUSLY REPORTED AND CALLED VALUE. Performed at [facility], [address] [place], [place] [address]    Culture (A)  Final    ESCHERICHIA COLI SUSCEPTIBILITIES PERFORMED ON PREVIOUS CULTURE WITHIN THE LAST 5 DAYS. Performed at [facility], [address] [place], [place] [address]    Report Status 11/24/2022 FINAL  Final  Blood Culture ID Panel (Reflexed)     Status: Abnormal   Collection Time: 11/21/22  5:11 PM  Result Value Ref Range Status   Enterococcus faecalis NOT DETECTED NOT DETECTED Final   Enterococcus Faecium NOT DETECTED NOT DETECTED Final   Listeria monocytogenes NOT DETECTED NOT DETECTED Final   Staphylococcus species NOT DETECTED NOT DETECTED Final   Staphylococcus aureus (BCID) NOT DETECTED NOT DETECTED Final   Staphylococcus epidermidis NOT DETECTED NOT DETECTED Final   Staphylococcus lugdunensis NOT DETECTED NOT DETECTED Final   Streptococcus species NOT DETECTED NOT DETECTED Final   Streptococcus agalactiae NOT DETECTED NOT DETECTED Final   Streptococcus pneumoniae NOT DETECTED NOT DETECTED Final   Streptococcus pyogenes NOT DETECTED NOT DETECTED Final    A.calcoaceticus-baumannii NOT DETECTED NOT DETECTED Final   Bacteroides fragilis NOT DETECTED NOT DETECTED Final   Enterobacterales DETECTED (A) NOT DETECTED Final    Comment: Enterobacterales represent a large order of gram negative bacteria, not a single organism. CRITICAL RESULT CALLED TO, READ BACK BY AND VERIFIED WITH: NATHAN BELUE 0617 11/22/22 SLM    Enterobacter cloacae complex NOT DETECTED NOT DETECTED Final   Escherichia coli DETECTED (A) NOT DETECTED Final    Comment: CRITICAL RESULT CALLED TO, READ BACK BY AND VERIFIED WITH: NATHAN BELUE 0617 11/22/22 SLM  Klebsiella aerogenes NOT DETECTED NOT DETECTED Final   Klebsiella oxytoca NOT DETECTED NOT DETECTED Final   Klebsiella pneumoniae NOT DETECTED NOT DETECTED Final   Proteus species NOT DETECTED NOT DETECTED Final   Salmonella species NOT DETECTED NOT DETECTED Final   Serratia marcescens NOT DETECTED NOT DETECTED Final   Haemophilus influenzae NOT DETECTED NOT DETECTED Final   Neisseria meningitidis NOT DETECTED NOT DETECTED Final   Pseudomonas aeruginosa NOT DETECTED NOT DETECTED Final   Stenotrophomonas maltophilia NOT DETECTED NOT DETECTED Final   Candida albicans NOT DETECTED NOT DETECTED Final   Candida auris NOT DETECTED NOT DETECTED Final   Candida glabrata NOT DETECTED NOT DETECTED Final   Candida krusei NOT DETECTED NOT DETECTED Final   Candida parapsilosis NOT DETECTED NOT DETECTED Final   Candida tropicalis NOT DETECTED NOT DETECTED Final   Cryptococcus neoformans/gattii NOT DETECTED NOT DETECTED Final   CTX-M ESBL NOT DETECTED NOT DETECTED Final   Carbapenem resistance IMP NOT DETECTED NOT DETECTED Final   Carbapenem resistance KPC NOT DETECTED NOT DETECTED Final   Carbapenem resistance NDM NOT DETECTED NOT DETECTED Final   Carbapenem resist OXA 48 LIKE NOT DETECTED NOT DETECTED Final   Carbapenem resistance VIM NOT DETECTED NOT DETECTED Final    Comment: Performed at [facility], [address] [place], [place] [address]  Resp panel by RT-PCR (RSV, Flu A&B, Covid) Anterior Nasal Swab     Status: None   Collection Time: 11/21/22  6:10 PM   Specimen: Anterior Nasal Swab  Result Value Ref Range Status   SARS Coronavirus 2 by RT PCR NEGATIVE NEGATIVE Final    Comment: (NOTE) SARS-CoV-2 target nucleic acids are NOT DETECTED.  The SARS-CoV-2 RNA is generally detectable in upper respiratory specimens during the acute phase of infection. The lowest concentration of SARS-CoV-2 viral copies this assay can detect is 138 copies/mL. A negative result does not preclude SARS-Cov-2 infection and should not be used as the sole basis for treatment or other patient management decisions. A negative result may occur with  improper specimen collection/handling, submission of specimen other than nasopharyngeal swab, presence of viral mutation(s) within the areas targeted by this assay, and inadequate number of viral copies(<138 copies/mL). A negative result must be combined with clinical observations, patient history, and epidemiological information. The expected result is Negative.  Fact Sheet for Patients:  BloggerCourse.com  Fact Sheet for Healthcare Providers:  SeriousBroker.it  This test is no t yet approved or cleared by the [place] FDA and  has been authorized for detection and/or diagnosis of SARS-CoV-2 by FDA under an Emergency Use Authorization (EUA). This EUA will remain  in effect (meaning this test can be used) for the duration of the COVID-19 declaration under Section 564(b)(1) of the Act, 21 U.S.C.section 360bbb-3(b)(1), unless the authorization is terminated  or revoked sooner.       Influenza A by PCR NEGATIVE NEGATIVE Final   Influenza B by PCR NEGATIVE NEGATIVE Final    Comment: (NOTE) The Xpert Xpress SARS-CoV-2/FLU/RSV plus assay is intended as an aid in the diagnosis of influenza from Nasopharyngeal swab  specimens and should not be used as a sole basis for treatment. Nasal washings and aspirates are unacceptable for Xpert Xpress SARS-CoV-2/FLU/RSV testing.  Fact Sheet for Patients: BloggerCourse.com  Fact Sheet for Healthcare Providers: SeriousBroker.it  This test is not yet approved or cleared by the [place] FDA and has been authorized for detection and/or diagnosis of SARS-CoV-2 by FDA under an Emergency Use Authorization (  EUA). This EUA will remain in effect (meaning this test can be used) for the duration of the COVID-19 declaration under Section 564(b)(1) of the Act, 21 U.S.C. section 360bbb-3(b)(1), unless the authorization is terminated or revoked.     Resp Syncytial Virus by PCR NEGATIVE NEGATIVE Final    Comment: (NOTE) Fact Sheet for Patients: BloggerCourse.com  Fact Sheet for Healthcare Providers: SeriousBroker.it  This test is not yet approved or cleared by the [place] FDA and has been authorized for detection and/or diagnosis of SARS-CoV-2 by FDA under an Emergency Use Authorization (EUA). This EUA will remain in effect (meaning this test can be used) for the duration of the COVID-19 declaration under Section 564(b)(1) of the Act, 21 U.S.C. section 360bbb-3(b)(1), unless the authorization is terminated or revoked.  Performed at [facility], [address] [place], [place] [address]   MRSA Next Gen by PCR, Nasal     Status: None   Collection Time: 11/22/22  1:27 PM   Specimen: Nasal Mucosa; Nasal Swab  Result Value Ref Range Status   MRSA by PCR Next Gen NOT DETECTED NOT DETECTED Final    Comment: (NOTE) The GeneXpert MRSA Assay (FDA approved for NASAL specimens only), is one component of a comprehensive MRSA colonization surveillance program. It is not intended to diagnose MRSA infection nor to guide or monitor treatment for MRSA  infections. Test performance is not FDA approved in patients less than 20 years old. Performed at [facility], [address] [place], [place] [address]   Culture, blood (Routine X 2) w Reflex to ID Panel     Status: None (Preliminary result)   Collection Time: 11/24/22  9:35 AM   Specimen: BLOOD RIGHT HAND  Result Value Ref Range Status   Specimen Description BLOOD RIGHT HAND  Final   Special Requests   Final    BOTTLES DRAWN AEROBIC AND ANAEROBIC Blood Culture adequate volume   Culture   Final    NO GROWTH 2 DAYS Performed at [facility], [address], [place], [place] [address]    Report Status PENDING  Incomplete  Culture, blood (Routine X 2) w Reflex to ID Panel     Status: None (Preliminary result)   Collection Time: 11/24/22  9:35 AM   Specimen: BLOOD RIGHT ARM  Result Value Ref Range Status   Specimen Description BLOOD RIGHT ARM  Final   Special Requests   Final    BOTTLES DRAWN AEROBIC AND ANAEROBIC Blood Culture adequate volume   Culture   Final    NO GROWTH 2 DAYS Performed at [facility], [address], [place], [place] [address]    Report Status PENDING  Incomplete    Coagulation Studies: No results for input(s): "LABPROT", "INR" in the last 72 hours.   Urinalysis: No results for input(s): "COLORURINE", "LABSPEC", "PHURINE", "GLUCOSEU", "HGBUR", "BILIRUBINUR", "KETONESUR", "PROTEINUR", "UROBILINOGEN", "NITRITE", "LEUKOCYTESUR" in the last 72 hours.  Invalid input(s): "APPERANCEUR"     Imaging: No results found.   Medications:      buPROPion  150 mg Oral Daily   calcitRIOL  0.25 mcg Oral Daily   Chlorhexidine Gluconate Cloth  6 each Topical Q0600   cholecalciferol  1,000 Units Oral Daily   docusate sodium  100 mg Oral BID   epoetin (EPOGEN/PROCRIT) injection  10,000 Units Intravenous Q T,Th,Sa-HD   feeding supplement (NEPRO CARB STEADY)  237 mL Oral BID BM   lamoTRIgine  25 mg Oral [id number]   [START ON  11/27/2022] levofloxacin  250 mg Oral Daily   lithium carbonate  150 mg Oral Q supper   multivitamin  1 tablet Oral QHS   OLANZapine  10 mg Oral QHS   polyethylene glycol  17 g Oral Daily   sodium chloride flush  3 mL Intravenous Q12H   acetaminophen **OR** acetaminophen, mouth rinse, traZODone  Assessment/ Plan:  77 y.o. male with bipolar disorder, recent admission for depression, end-stage renal disease, diabetes, hypertension, hyperlipidemia, gout, left retinal stroke, Crohn's disease, recurrent UTIs    admitted on 11/21/2022 for Fall, initial encounter [W19.XXXA] Fall at home, initial encounter [phone], [id number]] Sepsis, due to unspecified organism, unspecified whether acute organ dysfunction present [facility]) [A41.9]  Outpatient dialysis: TTS/ Lt AVF  #End-stage renal disease on hemodialysis - Next treatment scheduled for Saturday  #Anemia of chronic kidney disease  - Hemoglobin 7.7  - Continue Epogen with hemodialysis treatments.  #Sepsis   -Status post cystoscopy on 11/19/2022.  -Blood cultures are positive for Enterobacterales and E. Coli. Likely urinary source as u/a is abnormal.   -Will discharge on oral Levaquin   #Secondary hyperparathyroidism  - Calcium and phosphorus within desired range  # Constipation,  -Continue colace and miralax  - BM recorded today    LOS: 5 Wendee Beavers 9/6/20243:14 PM  [facility] [place], [place] [phone]

## 2022-11-26 NOTE — Progress Notes (Signed)
   11/25/22 2241  BiPAP/CPAP/SIPAP  $ Non-Invasive Ventilator  Non-Invasive Vent Subsequent  BiPAP/CPAP/SIPAP Pt Type Adult  BiPAP/CPAP/SIPAP DREAMSTATIOND  Mask Type Nasal mask  Mask Size Medium  EPAP 8 cmH2O  Patient Home Equipment No  Auto Titrate No

## 2022-11-26 NOTE — Plan of Care (Signed)

## 2022-11-26 NOTE — Discharge Summary (Signed)
Physician Discharge Summary   Patient: Luis Miller. MRN: [id number] DOB: 04/12/1945  Admit date:     11/21/2022  Discharge date: 11/26/22  Discharge Physician: Marrion Coy   PCP: Larae Grooms, NP   Recommendations at discharge:   Follow-up with PCP in 1 week. Follow-up with nephrology for hemodialysis. Check a CBC at  next dialysis. Follow-up with urology as scheduled. Patient has received Levaquin 750 mg today, start Levaquin tomorrow at 250 mg daily.  Discharge Diagnoses: Principal Problem:   Severe sepsis (HCC) Active Problems:   Acute metabolic encephalopathy   Hypotension   Hypoglycemia   Rhabdomyolysis   ESRD on hemodialysis (HCC)   Obstructive sleep apnea   Gross hematuria   Anemia of chronic disease   Constipation   Raynaud's phenomenon   Overweight (BMI 25.0-29.9)   Acute pyelonephritis  Resolved Problems:   * No resolved hospital problems. *  Hospital Course: 77 year old man with history of end-stage renal disease on hemodialysis, anemia, bipolar disorder, sleep apnea, gout, hypertension coming in after a fall.  Patient found to have a fever.  Patient had a recent cystoscopy for hematuria. Positive for E. coli in blood cultures.  CT scan concerning for right pyelonephritis, hydronephrosis.  Antibiotics switched over to Rocephin.    Transfer out of ICU on 9/3. Patient had E. coli septicemia, appear to be caused by pyelonephritis.   Patient has been seen by ID, decision made to transition to Levaquin at time of discharge.  Patient received a dose of Levaquin at 750 mg today, will continue with 250 mg daily for 6 days. Assessment and Plan:  * Severe sepsis (HCC) with E. coli bacteremia. Right pyelonephritis secondary to E. coli. Present on admission, with pyelonephritis with E. coli growing in blood cultures, fever, hypotension, leukocytosis, acute metabolic encephalopathy.  Antibiotics switched to Rocephin on 9/2.Marland Kitchen  Patient had recent  cystoscopy. Condition seems to be stabilizing.  Repeat blood culture negative x 24 hours.  Source of infection most likely secondary to right pyelonephritis, however, there is no urine culture sent out.  ID consult obtained.  Echocardiogram showed ejection fraction 60 to 65% without significant valvular disease. Antibiotics as above.   Elevated troponin secondary to mismatch demand and supply.   Likely due to sepsis.  Condition stable.   Acute metabolic encephalopathy Improved.   Hypotension Essential hypertension. Initial hypotension has resolved.  Discontinued midodrine. Patient blood pressure was also on hold, blood pressure noticeably elevated.  Not restarting ACE inhibitor and hydralazine.   Hypoglycemia Condition has improved after eating.   Traumatic rhabdomyolysis Fall with weakness. Patient will going to nursing home for rehab.   ESRD on hemodialysis (HCC) Hypokalemia. HD per nephrology.   Raynaud's phenomenon Of his hands, stable   Constipation Continue stool softeners.   Anemia of chronic disease Chronic thrombocytopenia. Hemoglobin trending down to 7.3 yesterday, no evidence of bleeding. This appears to be secondary to anemia of chronic kidney disease in addition to bone marrow suppression from medical illness.  Hemoglobin increased to 7.7 today, no need for transfusion.  Medically stable for discharge.   Gross hematuria Had recent cystoscopy which did not show any etiology for hematuria.  Likely BPH.     Obstructive sleep apnea On CPAP at night      Consultants: ID, nephrology Procedures performed: None  Disposition: Skilled nursing facility Diet recommendation:  Discharge Diet Orders (From admission, onward)     Start     Ordered   11/25/22 0000  Diet general  Comments: Dys 3 diet   11/25/22 1545           Dysphagia type 3 thin Liquid DISCHARGE MEDICATION: Allergies as of 11/26/2022       Reactions   Mirtazapine Rash   Aspirin     Other reaction(s): Unknown Patient was instructed not to take aspirin but can not remember why   Atomoxetine Other (See Comments)   Urinary sx   Strattera [atomoxetine Hcl] Other (See Comments)   Urinary sx   Xyzal [levocetirizine Dihydrochloride] Rash   Patient reported on 11/02/17        Medication List     STOP taking these medications    benazepril 40 MG tablet Commonly known as: LOTENSIN   hydrALAZINE 100 MG tablet Commonly known as: APRESOLINE       TAKE these medications    buPROPion 150 MG 24 hr tablet Commonly known as: WELLBUTRIN XL Take 1 tablet (150 mg total) by mouth daily.   calcitRIOL 0.25 MCG capsule Commonly known as: ROCALTROL Take 0.25 mcg by mouth daily.   doxepin 100 MG capsule Commonly known as: SINEQUAN Take 1 capsule (100 mg total) by mouth at bedtime.   epoetin alfa 16109 UNIT/ML injection Commonly known as: EPOGEN Inject 1 mL (10,000 Units total) into the vein Every Tuesday,Thursday,and Saturday with dialysis. Start taking on: November 27, 2022   lactulose 10 GM/15ML solution Commonly known as: CHRONULAC Take 20 g by mouth daily.   lamoTRIgine 25 MG tablet Commonly known as: LAMICTAL Take 1 tablet (25 mg total) by mouth 2 (two) times daily at 8 am and 4 pm.   levofloxacin 250 MG tablet Commonly known as: LEVAQUIN Take 1 tablet (250 mg total) by mouth daily for 6 days. Start taking on: November 27, 2022   lithium carbonate 150 MG capsule Take 1 capsule (150 mg total) by mouth daily with supper.   loperamide 2 MG capsule Commonly known as: IMODIUM Take 2 mg by mouth daily as needed for diarrhea or loose stools.   loratadine 10 MG tablet Commonly known as: CLARITIN TAKE 1 TABLET BY MOUTH EVERY DAY   multivitamin tablet Take 1 tablet by mouth daily.   OLANZapine 10 MG tablet Commonly known as: ZYPREXA Take 1 tablet (10 mg total) by mouth at bedtime.   rosuvastatin 10 MG tablet Commonly known as: CRESTOR TAKE 1 TABLET  BY MOUTH EVERY DAY   traZODone 50 MG tablet Commonly known as: DESYREL Take 50 mg by mouth at bedtime as needed for sleep.   Vitamin D (Cholecalciferol) 25 MCG (1000 UT) Tabs Take 1,000 Units by mouth daily.        Contact information for follow-up providers     Larae Grooms, NP Follow up in 1 week(s).   Specialty: Nurse Practitioner Contact information: [address] [place] [address] [phone]              Contact information for after-discharge care     Destination     [facility] Preferred SNF .   Service: Skilled Nursing Contact information: [address] [place] [address] [phone]                    Discharge Exam: Filed Weights   11/25/22 0906 11/25/22 1331 11/26/22 0500  Weight: 80.5 kg 78.4 kg 78.7 kg   General exam: Appears calm and comfortable  Respiratory system: Clear to auscultation. Respiratory effort normal. Cardiovascular system: S1 & S2 heard, RRR.  No JVD, murmurs, rubs, gallops or clicks. No pedal edema. Gastrointestinal system: Abdomen is nondistended, soft and nontender. No organomegaly or masses felt. Normal bowel sounds heard. Central nervous system: Alert and oriented. No focal neurological deficits. Extremities: Symmetric 5 x 5 power. Skin: No rashes, lesions or ulcers Psychiatry: Judgement and insight appear normal. Mood & affect appropriate.    Condition at discharge: good  The results of significant diagnostics from this hospitalization (including imaging, microbiology, ancillary and laboratory) are listed below for reference.   Imaging Studies: ECHOCARDIOGRAM COMPLETE  Result Date: 11/22/2022    ECHOCARDIOGRAM REPORT   Patient Name:   Luis Miller. Date of Exam: 11/22/2022 Medical Rec #:  [id number]          Height:       66.0 in Accession #:    [id number]         Weight:       177.0 lb Date of Birth:  08/30/1945          BSA:          1.899 m Patient Age:     77 years           BP:           80/46 mmHg Patient Gender: M                  HR:           85 bpm. Exam Location:  [facility] Procedure: 2D Echo, Cardiac Doppler and Color Doppler Indications:    Syncope R55; Hypotension  History:        Patient has prior history of Echocardiogram examinations, most                 recent 08/02/2019. Signs/Symptoms:Syncope; Risk                 Factors:Hypertension, Dyslipidemia, Sleep Apnea and Former                 Smoker.  Sonographer:    Dondra Prader RVT RCS Referring Phys: [id number] EKTA V PATEL IMPRESSIONS  1. Left ventricular ejection fraction, by estimation, is 60 to 65%. The left ventricle has normal function. The left ventricle has no regional wall motion abnormalities. Left ventricular diastolic parameters are consistent with Grade I diastolic dysfunction (impaired relaxation).  2. Right ventricular systolic function is normal. The right ventricular size is normal. There is normal pulmonary artery systolic pressure. The estimated right ventricular systolic pressure is 29.9 mmHg.  3. Right atrial size was severely dilated.  4. The mitral valve is normal in structure. No evidence of mitral valve regurgitation. No evidence of mitral stenosis.  5. The aortic valve is tricuspid. Aortic valve regurgitation is not visualized. Aortic valve sclerosis/calcification is present, without any evidence of aortic stenosis. Aortic valve area, by VTI measures 3.76 cm. Aortic valve mean gradient measures 6.0 mmHg. Aortic valve Vmax measures 1.60 m/s.  6. Aortic dilatation noted. There is mild dilatation of the ascending aorta, measuring 38 mm.  7. The inferior vena cava is dilated in size with <50% respiratory variability, suggesting right atrial pressure of 15 mmHg. FINDINGS  Left Ventricle: Left ventricular ejection fraction, by estimation, is 60 to 65%. The left ventricle has normal function. The left ventricle has no regional wall motion abnormalities. The left ventricular internal cavity size  was normal in size. There is  no left ventricular hypertrophy. Left ventricular diastolic parameters are consistent with Grade I diastolic dysfunction (  impaired relaxation). Normal left ventricular filling pressure. Right Ventricle: The right ventricular size is normal. No increase in right ventricular wall thickness. Right ventricular systolic function is normal. There is normal pulmonary artery systolic pressure. The tricuspid regurgitant velocity is 1.93 m/s, and  with an assumed right atrial pressure of 15 mmHg, the estimated right ventricular systolic pressure is 29.9 mmHg. Left Atrium: Left atrial size was normal in size. Right Atrium: Right atrial size was severely dilated. Pericardium: There is no evidence of pericardial effusion. Mitral Valve: The mitral valve is normal in structure. No evidence of mitral valve regurgitation. No evidence of mitral valve stenosis. Tricuspid Valve: The tricuspid valve is normal in structure. Tricuspid valve regurgitation is not demonstrated. No evidence of tricuspid stenosis. Aortic Valve: The aortic valve is tricuspid. Aortic valve regurgitation is not visualized. Aortic valve sclerosis/calcification is present, without any evidence of aortic stenosis. Aortic valve mean gradient measures 6.0 mmHg. Aortic valve peak gradient measures 10.2 mmHg. Aortic valve area, by VTI measures 3.76 cm. Pulmonic Valve: The pulmonic valve was normal in structure. Pulmonic valve regurgitation is not visualized. No evidence of pulmonic stenosis. Aorta: Aortic dilatation noted. There is mild dilatation of the ascending aorta, measuring 38 mm. Venous: The inferior vena cava is dilated in size with less than 50% respiratory variability, suggesting right atrial pressure of 15 mmHg. IAS/Shunts: No atrial level shunt detected by color flow Doppler.  LEFT VENTRICLE PLAX 2D LVIDd:         4.20 cm   Diastology LVIDs:         3.10 cm   LV e' medial:    7.83 cm/s LV PW:         1.20 cm   LV E/e' medial:   10.5 LV IVS:        1.00 cm   LV e' lateral:   11.40 cm/s LVOT diam:     2.20 cm   LV E/e' lateral: 7.2 LV SV:         105 LV SV Index:   55 LVOT Area:     3.80 cm  RIGHT VENTRICLE             IVC RV S prime:     17.20 cm/s  IVC diam: 2.20 cm TAPSE (M-mode): 2.8 cm LEFT ATRIUM             Index        RIGHT ATRIUM           Index LA diam:        3.50 cm 1.84 cm/m   RA Area:     23.70 cm LA Vol (A2C):   57.6 ml 30.34 ml/m  RA Volume:   86.80 ml  45.72 ml/m LA Vol (A4C):   54.3 ml 28.60 ml/m LA Biplane Vol: 57.0 ml 30.02 ml/m  AORTIC VALVE                     PULMONIC VALVE AV Area (Vmax):    3.87 cm      PV Vmax:       1.01 m/s AV Area (Vmean):   3.66 cm      PV Peak grad:  4.1 mmHg AV Area (VTI):     3.76 cm AV Vmax:           160.00 cm/s AV Vmean:          108.000 cm/s AV VTI:  0.279 m AV Peak Grad:      10.2 mmHg AV Mean Grad:      6.0 mmHg LVOT Vmax:         163.00 cm/s LVOT Vmean:        104.000 cm/s LVOT VTI:          0.276 m LVOT/AV VTI ratio: 0.99  AORTA Ao Root diam: 3.10 cm Ao Asc diam:  3.80 cm MITRAL VALVE                TRICUSPID VALVE MV Area (PHT): 3.68 cm     TR Peak grad:   14.9 mmHg MV Decel Time: 206 msec     TR Vmax:        193.00 cm/s MV E velocity: 82.50 cm/s MV A velocity: 109.00 cm/s  SHUNTS MV E/A ratio:  0.76         Systemic VTI:  0.28 m                             Systemic Diam: 2.20 cm Armanda Magic MD Electronically signed by Armanda Magic MD Signature Date/Time: 11/22/2022/2:43:46 PM    Final    CT Chest Wo Contrast  Result Date: 11/21/2022 CLINICAL DATA:  Sepsis.  Fall.  Altered mental status. EXAM: CT CHEST, ABDOMEN AND PELVIS WITHOUT CONTRAST TECHNIQUE: Multidetector CT imaging of the chest, abdomen and pelvis was performed following the standard protocol without IV contrast. RADIATION DOSE REDUCTION: This exam was performed according to the departmental dose-optimization program which includes automated exposure control, adjustment of the mA and/or kV according  to patient size and/or use of iterative reconstruction technique. COMPARISON:  None Available. FINDINGS: CT CHEST FINDINGS Cardiovascular: Heart insert upper limits normal in size. Aorta normal caliber. Scattered coronary artery and aortic atherosclerosis. Mediastinum/Nodes: No mediastinal, hilar, or axillary adenopathy. Trachea and esophagus are unremarkable. Thyroid unremarkable. Lungs/Pleura: Linear scarring or atelectasis in the lung bases. No effusions or pneumothorax. No suspicious pulmonary nodules. Musculoskeletal: Chest wall soft tissues are unremarkable. Multiple old healed bilateral rib fractures. Possible acute nondisplaced left lateral 5th rib fracture. Old healed right scapular fracture. CT ABDOMEN PELVIS FINDINGS Hepatobiliary: Small layering gallstones within the gallbladder. No focal hepatic abnormality or biliary ductal dilatation. Pancreas: No focal abnormality or ductal dilatation. Spleen: No focal abnormality.  Normal size. Adrenals/Urinary Tract: Normal adrenal glands. Large cyst off the upper pole of the right kidney measuring 6.3 cm, stable. Other smaller cysts. No follow-up imaging recommended. Mild right perinephric stranding. No hydronephrosis. Urinary bladder unremarkable with Foley catheter in place. Stomach/Bowel: Stomach, large and small bowel grossly unremarkable. Moderate stool burden throughout the colon. Few scattered colonic diverticula. Normal appendix. Vascular/Lymphatic: Aortoiliac atherosclerosis. No evidence of aneurysm or adenopathy. Reproductive: No visible focal abnormality. Penile implant in place. Other: No free fluid or free air. Musculoskeletal: No acute bony abnormality. IMPRESSION: No acute cardiopulmonary disease. Bibasilar atelectasis or scarring. Right perinephric stranding, new since prior study. No hydronephrosis or evidence of stones. Recommend clinical correlation to completely exclude infection/pyelonephritis. Cholelithiasis. Moderate stool burden with  scattered colonic diverticula. Electronically Signed   By: Charlett Nose M.D.   On: 11/21/2022 21:04   CT ABDOMEN PELVIS WO CONTRAST  Result Date: 11/21/2022 CLINICAL DATA:  Sepsis.  Fall.  Altered mental status. EXAM: CT CHEST, ABDOMEN AND PELVIS WITHOUT CONTRAST TECHNIQUE: Multidetector CT imaging of the chest, abdomen and pelvis was performed following the standard protocol without IV contrast. RADIATION DOSE REDUCTION:  This exam was performed according to the departmental dose-optimization program which includes automated exposure control, adjustment of the mA and/or kV according to patient size and/or use of iterative reconstruction technique. COMPARISON:  None Available. FINDINGS: CT CHEST FINDINGS Cardiovascular: Heart insert upper limits normal in size. Aorta normal caliber. Scattered coronary artery and aortic atherosclerosis. Mediastinum/Nodes: No mediastinal, hilar, or axillary adenopathy. Trachea and esophagus are unremarkable. Thyroid unremarkable. Lungs/Pleura: Linear scarring or atelectasis in the lung bases. No effusions or pneumothorax. No suspicious pulmonary nodules. Musculoskeletal: Chest wall soft tissues are unremarkable. Multiple old healed bilateral rib fractures. Possible acute nondisplaced left lateral 5th rib fracture. Old healed right scapular fracture. CT ABDOMEN PELVIS FINDINGS Hepatobiliary: Small layering gallstones within the gallbladder. No focal hepatic abnormality or biliary ductal dilatation. Pancreas: No focal abnormality or ductal dilatation. Spleen: No focal abnormality.  Normal size. Adrenals/Urinary Tract: Normal adrenal glands. Large cyst off the upper pole of the right kidney measuring 6.3 cm, stable. Other smaller cysts. No follow-up imaging recommended. Mild right perinephric stranding. No hydronephrosis. Urinary bladder unremarkable with Foley catheter in place. Stomach/Bowel: Stomach, large and small bowel grossly unremarkable. Moderate stool burden throughout the  colon. Few scattered colonic diverticula. Normal appendix. Vascular/Lymphatic: Aortoiliac atherosclerosis. No evidence of aneurysm or adenopathy. Reproductive: No visible focal abnormality. Penile implant in place. Other: No free fluid or free air. Musculoskeletal: No acute bony abnormality. IMPRESSION: No acute cardiopulmonary disease. Bibasilar atelectasis or scarring. Right perinephric stranding, new since prior study. No hydronephrosis or evidence of stones. Recommend clinical correlation to completely exclude infection/pyelonephritis. Cholelithiasis. Moderate stool burden with scattered colonic diverticula. Electronically Signed   By: Charlett Nose M.D.   On: 11/21/2022 21:04   CT Head Wo Contrast  Result Date: 11/21/2022 CLINICAL DATA:  Head trauma, moderate-severe AMS, fall. EXAM: CT HEAD WITHOUT CONTRAST TECHNIQUE: Contiguous axial images were obtained from the base of the skull through the vertex without intravenous contrast. RADIATION DOSE REDUCTION: This exam was performed according to the departmental dose-optimization program which includes automated exposure control, adjustment of the mA and/or kV according to patient size and/or use of iterative reconstruction technique. COMPARISON:  03/30/2006 FINDINGS: Brain: There is atrophy and chronic small vessel disease changes. No acute intracranial abnormality. Specifically, no hemorrhage, hydrocephalus, mass lesion, acute infarction, or significant intracranial injury. Vascular: No hyperdense vessel or unexpected calcification. Skull: No acute calvarial abnormality. Sinuses/Orbits: No acute findings Other: None IMPRESSION: Atrophy, chronic microvascular disease. No acute intracranial abnormality. Electronically Signed   By: Charlett Nose M.D.   On: 11/21/2022 20:56   DG Chest Port 1 View  Result Date: 11/21/2022 CLINICAL DATA:  Dizziness.  Fall. EXAM: PORTABLE CHEST 1 VIEW COMPARISON:  09/27/2022 FINDINGS: Remote bilateral rib fractures. The left-sided  fractures are new since the prior plain film. Midline trachea. Mild cardiomegaly. Tortuous thoracic aorta. No pleural effusion or pneumothorax. Clear lungs. No congestive failure. IMPRESSION: 1. No acute cardiopulmonary disease. 2. Cardiomegaly without congestive failure. Electronically Signed   By: Jeronimo Greaves M.D.   On: 11/21/2022 18:44   VAS US DUPLEX DIALYSIS ACCESS (AVF,AVG)  Result Date: 11/03/2022 DIALYSIS ACCESS Patient Name:  Luis Miller.  Date of Exam:   11/01/2022 Medical Rec #: [id number]           Accession #:    [id number] Date of Birth: 1946-02-26           Patient Gender: M Patient Age:   44 years Exam Location:  [facility] Procedure:  VAS US DUPLEX DIALYSIS ACCESS (AVF, AVG) Referring Phys: Festus Barren --------------------------------------------------------------------------------  Access Site: Left Upper Extremity. Access Type: Brachial Axillary AVG. History: Created 11/05/2021           09/21/2022: PTA and Stent placement Venous Outflow Left Brachial          Axillary AVG peripheral segment. Comparison Study: 03/12/2022 Performing Technologist: Debbe Bales RVS  Examination Guidelines: A complete evaluation includes B-mode imaging, spectral Doppler, color Doppler, and power Doppler as needed of all accessible portions of each vessel. Unilateral testing is considered an integral part of a complete examination. Limited examinations for reoccurring indications may be performed as noted.  Findings:   +--------------------+----------+-----------------+--------+ AVG                 PSV (cm/s)Flow Vol (mL/min)Describe +--------------------+----------+-----------------+--------+ Native artery inflow   503          1332                +--------------------+----------+-----------------+--------+ Arterial anastomosis   744                              +--------------------+----------+-----------------+--------+ Prox graft             546                               +--------------------+----------+-----------------+--------+ Mid graft              400                              +--------------------+----------+-----------------+--------+ Distal graft           326                              +--------------------+----------+-----------------+--------+ Venous anastomosis     117                              +--------------------+----------+-----------------+--------+ Venous outflow         127                              +--------------------+----------+-----------------+--------+ +--------------+-------------+---------+---------+---------+-------------------+               Diameter (cm)  Depth  Branching   PSV       Flow Volume                                  (cm)             (cm/s)       (ml/min)       +--------------+-------------+---------+---------+---------+-------------------+ Lt Rad Art                                      70                        Dist                                                                      +--------------+-------------+---------+---------+---------+-------------------+  Summary: The Left Brachial Axillary AVG appears to be patent throughout; Flow Volume appears to be Normal as well.  *See table(s) above for measurements and observations.  Diagnosing physician: Festus Barren MD Electronically signed by Festus Barren MD on 11/03/2022 at 3:18:30 PM.   --------------------------------------------------------------------------------   Final     Microbiology: Results for orders placed or performed during the hospital encounter of 11/21/22  Blood Culture (routine x 2)     Status: Abnormal   Collection Time: 11/21/22  5:11 PM   Specimen: BLOOD  Result Value Ref Range Status   Specimen Description   Final    BLOOD RIGHT ANTECUBITAL Performed at [facility], [address], [place], [place] [address]    Special Requests   Final    BOTTLES DRAWN AEROBIC AND ANAEROBIC Blood  Culture adequate volume Performed at [facility], [address], [place], [place] [address]    Culture  Setup Time   Final    GRAM NEGATIVE RODS IN BOTH AEROBIC AND ANAEROBIC BOTTLES CRITICAL RESULT CALLED TO, READ BACK BY AND VERIFIED WITH: Dawayne Cirri [id number] 11/22/22 SLM Performed at [facility], [address], [place], [place] [address]    Culture ESCHERICHIA COLI (A)  Final   Report Status 11/24/2022 FINAL  Final   Organism ID, Bacteria ESCHERICHIA COLI  Final      Susceptibility   Escherichia coli - MIC*    AMPICILLIN >=32 RESISTANT Resistant     CEFEPIME <=0.12 SENSITIVE Sensitive     CEFTAZIDIME <=1 SENSITIVE Sensitive     CEFTRIAXONE <=0.25 SENSITIVE Sensitive     CIPROFLOXACIN <=0.25 SENSITIVE Sensitive     GENTAMICIN <=1 SENSITIVE Sensitive     IMIPENEM <=0.25 SENSITIVE Sensitive     TRIMETH/SULFA >=320 RESISTANT Resistant     AMPICILLIN/SULBACTAM >=32 RESISTANT Resistant     PIP/TAZO 32 INTERMEDIATE Intermediate     * ESCHERICHIA COLI  Blood Culture (routine x 2)     Status: Abnormal   Collection Time: 11/21/22  5:11 PM   Specimen: BLOOD  Result Value Ref Range Status   Specimen Description   Final    BLOOD BLOOD RIGHT FOREARM Performed at [facility], [address] [place], [place] [address]    Special Requests   Final    BOTTLES DRAWN AEROBIC AND ANAEROBIC Blood Culture adequate volume Performed at [facility], [address] [place], [place] [address]    Culture  Setup Time   Final    GRAM NEGATIVE RODS IN BOTH AEROBIC AND ANAEROBIC BOTTLES CRITICAL VALUE NOTED.  VALUE IS CONSISTENT WITH PREVIOUSLY REPORTED AND CALLED VALUE. Performed at [facility], [address] [place], [place] [address]    Culture (A)  Final    ESCHERICHIA COLI SUSCEPTIBILITIES PERFORMED ON PREVIOUS CULTURE WITHIN THE LAST 5 DAYS. Performed at [facility], [address] [place], [place] [address]    Report Status  11/24/2022 FINAL  Final  Blood Culture ID Panel (Reflexed)     Status: Abnormal   Collection Time: 11/21/22  5:11 PM  Result Value Ref Range Status   Enterococcus faecalis NOT DETECTED NOT DETECTED Final   Enterococcus Faecium NOT DETECTED NOT DETECTED Final   Listeria monocytogenes NOT DETECTED NOT DETECTED Final   Staphylococcus species NOT DETECTED NOT DETECTED Final   Staphylococcus aureus (BCID) NOT DETECTED NOT DETECTED Final   Staphylococcus epidermidis NOT DETECTED NOT DETECTED Final   Staphylococcus lugdunensis NOT DETECTED NOT DETECTED Final  Streptococcus species NOT DETECTED NOT DETECTED Final   Streptococcus agalactiae NOT DETECTED NOT DETECTED Final   Streptococcus pneumoniae NOT DETECTED NOT DETECTED Final   Streptococcus pyogenes NOT DETECTED NOT DETECTED Final   A.calcoaceticus-baumannii NOT DETECTED NOT DETECTED Final   Bacteroides fragilis NOT DETECTED NOT DETECTED Final   Enterobacterales DETECTED (A) NOT DETECTED Final    Comment: Enterobacterales represent a large order of gram negative bacteria, not a single organism. CRITICAL RESULT CALLED TO, READ BACK BY AND VERIFIED WITH: NATHAN BELUE 0617 11/22/22 [facility]    Enterobacter cloacae complex NOT DETECTED NOT DETECTED Final   Escherichia coli DETECTED (A) NOT DETECTED Final    Comment: CRITICAL RESULT CALLED TO, READ BACK BY AND VERIFIED WITH: NATHAN BELUE 0617 11/22/22 [facility]    Klebsiella aerogenes NOT DETECTED NOT DETECTED Final   Klebsiella oxytoca NOT DETECTED NOT DETECTED Final   Klebsiella pneumoniae NOT DETECTED NOT DETECTED Final   Proteus species NOT DETECTED NOT DETECTED Final   Salmonella species NOT DETECTED NOT DETECTED Final   Serratia marcescens NOT DETECTED NOT DETECTED Final   Haemophilus influenzae NOT DETECTED NOT DETECTED Final   Neisseria meningitidis NOT DETECTED NOT DETECTED Final   Pseudomonas aeruginosa NOT DETECTED NOT DETECTED Final   Stenotrophomonas maltophilia NOT DETECTED NOT DETECTED  Final   Candida albicans NOT DETECTED NOT DETECTED Final   Candida auris NOT DETECTED NOT DETECTED Final   Candida glabrata NOT DETECTED NOT DETECTED Final   Candida krusei NOT DETECTED NOT DETECTED Final   Candida parapsilosis NOT DETECTED NOT DETECTED Final   Candida tropicalis NOT DETECTED NOT DETECTED Final   Cryptococcus neoformans/gattii NOT DETECTED NOT DETECTED Final   CTX-M ESBL NOT DETECTED NOT DETECTED Final   Carbapenem resistance IMP NOT DETECTED NOT DETECTED Final   Carbapenem resistance KPC NOT DETECTED NOT DETECTED Final   Carbapenem resistance NDM NOT DETECTED NOT DETECTED Final   Carbapenem resist OXA 48 LIKE NOT DETECTED NOT DETECTED Final   Carbapenem resistance VIM NOT DETECTED NOT DETECTED Final    Comment: Performed at [facility], [address] [place], [place] [address]  Resp panel by RT-PCR (RSV, Flu A&B, Covid) Anterior Nasal Swab     Status: None   Collection Time: 11/21/22  6:10 PM   Specimen: Anterior Nasal Swab  Result Value Ref Range Status   SARS Coronavirus 2 by RT PCR NEGATIVE NEGATIVE Final    Comment: (NOTE) SARS-CoV-2 target nucleic acids are NOT DETECTED.  The SARS-CoV-2 RNA is generally detectable in upper respiratory specimens during the acute phase of infection. The lowest concentration of SARS-CoV-2 viral copies this assay can detect is 138 copies/mL. A negative result does not preclude SARS-Cov-2 infection and should not be used as the sole basis for treatment or other patient management decisions. A negative result may occur with  improper specimen collection/handling, submission of specimen other than nasopharyngeal swab, presence of viral mutation(s) within the areas targeted by this assay, and inadequate number of viral copies(<138 copies/mL). A negative result must be combined with clinical observations, patient history, and epidemiological information. The expected result is Negative.  Fact Sheet for Patients:   BloggerCourse.com  Fact Sheet for Healthcare Providers:  SeriousBroker.it  This test is no t yet approved or cleared by the [place] FDA and  has been authorized for detection and/or diagnosis of SARS-CoV-2 by FDA under an Emergency Use Authorization (EUA). This EUA will remain  in effect (meaning this test can be used) for the duration of the  COVID-19 declaration under Section 564(b)(1) of the Act, 21 U.S.C.section 360bbb-3(b)(1), unless the authorization is terminated  or revoked sooner.       Influenza A by PCR NEGATIVE NEGATIVE Final   Influenza B by PCR NEGATIVE NEGATIVE Final    Comment: (NOTE) The Xpert Xpress SARS-CoV-2/FLU/RSV plus assay is intended as an aid in the diagnosis of influenza from Nasopharyngeal swab specimens and should not be used as a sole basis for treatment. Nasal washings and aspirates are unacceptable for Xpert Xpress SARS-CoV-2/FLU/RSV testing.  Fact Sheet for Patients: BloggerCourse.com  Fact Sheet for Healthcare Providers: SeriousBroker.it  This test is not yet approved or cleared by the [place] FDA and has been authorized for detection and/or diagnosis of SARS-CoV-2 by FDA under an Emergency Use Authorization (EUA). This EUA will remain in effect (meaning this test can be used) for the duration of the COVID-19 declaration under Section 564(b)(1) of the Act, 21 U.S.C. section 360bbb-3(b)(1), unless the authorization is terminated or revoked.     Resp Syncytial Virus by PCR NEGATIVE NEGATIVE Final    Comment: (NOTE) Fact Sheet for Patients: BloggerCourse.com  Fact Sheet for Healthcare Providers: SeriousBroker.it  This test is not yet approved or cleared by the [place] FDA and has been authorized for detection and/or diagnosis of SARS-CoV-2 by FDA under an Emergency Use  Authorization (EUA). This EUA will remain in effect (meaning this test can be used) for the duration of the COVID-19 declaration under Section 564(b)(1) of the Act, 21 U.S.C. section 360bbb-3(b)(1), unless the authorization is terminated or revoked.  Performed at [facility], [address] [place], [place] [address]   MRSA Next Gen by PCR, Nasal     Status: None   Collection Time: 11/22/22  1:27 PM   Specimen: Nasal Mucosa; Nasal Swab  Result Value Ref Range Status   MRSA by PCR Next Gen NOT DETECTED NOT DETECTED Final    Comment: (NOTE) The GeneXpert MRSA Assay (FDA approved for NASAL specimens only), is one component of a comprehensive MRSA colonization surveillance program. It is not intended to diagnose MRSA infection nor to guide or monitor treatment for MRSA infections. Test performance is not FDA approved in patients less than 33 years old. Performed at [facility], [address] [place], [place] [address]   Culture, blood (Routine X 2) w Reflex to ID Panel     Status: None (Preliminary result)   Collection Time: 11/24/22  9:35 AM   Specimen: BLOOD RIGHT HAND  Result Value Ref Range Status   Specimen Description BLOOD RIGHT HAND  Final   Special Requests   Final    BOTTLES DRAWN AEROBIC AND ANAEROBIC Blood Culture adequate volume   Culture   Final    NO GROWTH 2 DAYS Performed at [facility], [address], [place], [place] [address]    Report Status PENDING  Incomplete  Culture, blood (Routine X 2) w Reflex to ID Panel     Status: None (Preliminary result)   Collection Time: 11/24/22  9:35 AM   Specimen: BLOOD RIGHT ARM  Result Value Ref Range Status   Specimen Description BLOOD RIGHT ARM  Final   Special Requests   Final    BOTTLES DRAWN AEROBIC AND ANAEROBIC Blood Culture adequate volume   Culture   Final    NO GROWTH 2 DAYS Performed at [facility], [address], [place], [place] [address]    Report Status  PENDING  Incomplete  Labs: CBC: Recent Labs  Lab 11/21/22 1710 11/23/22 0914 11/23/22 1419 11/24/22 0605 11/25/22 0632 11/25/22 0910 11/26/22 0630  WBC 11.0*   < > 7.6 5.7 4.7 4.8 4.5  NEUTROABS 9.9*  --   --   --   --   --   --   HGB 9.4*   < > 8.5* 7.6* 7.5* 7.3* 7.7*  HCT 28.5*   < > 24.9* 22.3* 22.3* 22.0* 22.9*  MCV 104.8*   < > 100.0 100.9* 100.9* 102.3* 101.8*  PLT 110*   < > 105* 104* 104* 103* 120*   < > = values in this interval not displayed.   Basic Metabolic Panel: Recent Labs  Lab 11/21/22 1710 11/23/22 0914 11/23/22 1419 11/24/22 0605 11/25/22 0910  NA 138 138 137 136 136  K 3.6 3.5 3.4* 3.6 3.4*  CL 94* 103 97* 100 99  CO2 28 23 30 28 26   GLUCOSE 117* 210* 119* 129* 117*  BUN 41* 59* 23 39* 61*  CREATININE 6.33* 7.93* 3.53* 4.92* 5.72*  CALCIUM 9.2 8.4* 8.4* 8.3* 8.6*  PHOS  --   --  2.4*  --  3.7   Liver Function Tests: Recent Labs  Lab 11/21/22 1710 11/23/22 1419 11/25/22 0910  AST 129*  --   --   ALT 32  --   --   ALKPHOS 88  --   --   BILITOT 1.1  --   --   PROT 7.5  --   --   ALBUMIN 3.8 2.8* 2.6*   CBG: Recent Labs  Lab 11/23/22 1623 11/23/22 2149 11/24/22 0721 11/24/22 1114 11/24/22 1439  GLUCAP 89 125* 118* 149* 188*    Discharge time spent: greater than 30 minutes.  Signed: Marrion Coy, MD [facility] 11/26/2022

## 2022-11-26 NOTE — Progress Notes (Signed)
Report given to [place] [facility].

## 2022-11-26 NOTE — Consult Note (Signed)
[facility] Customer service manager [facility]) Accountable Care Organization (ACO) [facility] Liaison Note  11/26/2022  Luis Miller [birth year] [id number]  Location: [facility] RN Hospital Liaison screened the patient remotely at [facility].  Insurance: [employer]   [name] is a 77 y.o. male who is a Primary Care Patient of Larae Grooms, NP. The patient was screened for  readmission hospitalization with noted extreme risk score for unplanned readmission risk with 4 IP/1 ED in 6 months.  The patient was assessed for potential [facility]) Care Management service needs for post hospital transition for care coordination. Review of patient's electronic medical record reveals patient was admitted for a fall secondary to Sepsis. Pt will discharge to SNF for ongoing rehabilitation at [facility]. Facility will continue to address pt's ongoing needs  Plan: [facility] Liaison will continue to follow progress and disposition to asess for post hospital community care coordination/management needs.  Referral request for community care coordination: Will collaborate with PAC-RN concerning pt's d/c to SNF.   [facility] Care Management/Population Health does not replace or interfere with any arrangements made by the Inpatient Transition of Care team.   For questions contact:   Elliot Cousin, RN, [facility] Liaison [place]   Population Health Office Hours MTWF  8:00 am-6:00 pm [phone] mobile [phone] [Office toll free line] Office Hours are M-F 8:30 - 5 pm [email or web address]

## 2022-11-26 NOTE — TOC Transition Note (Signed)
Transition of Care [facility]) - CM/SW Discharge Note   Patient Details  Name: Luis Miller. MRN: [id number] Date of Birth: 18-Sep-1945  Transition of Care [facility]) CM/SW Contact:  Chapman Fitch, RN Phone Number: 11/26/2022, 11:07 AM   Clinical Narrative:    Patient will DC to: [facility] Anticipated DC date:11/26/22  Family notified: Son Psychologist, prison and probation services by: ex spouse [name]  MD to sign lithium script, [name] to take patients home CPAP machine to facility   Per MD patient ready for DC to . RN, patient, patient's family, and facility notified of DC. Discharge Summary sent to facility. RN given number for report. DC packet on chart.  TOC signing off.      Barriers to Discharge: Continued Medical Work up   Patient Goals and CMS Choice CMS Medicare.gov Compare Post Acute Care list provided to:: Patient Choice offered to / list presented to : Patient  Discharge Placement                         Discharge Plan and Services Additional resources added to the After Visit Summary for                                       Social Determinants of Health (SDOH) Interventions SDOH Screenings   Food Insecurity: No Food Insecurity (11/21/2022)  Housing: Patient Declined (11/21/2022)  Transportation Needs: Patient Declined (11/21/2022)  Utilities: Not At Risk (11/21/2022)  Alcohol Screen: Low Risk  (09/30/2022)  Depression (PHQ2-9): High Risk (11/05/2022)  Financial Resource Strain: Low Risk  (12/07/2021)  Physical Activity: Insufficiently Active (12/07/2021)  Social Connections: Moderately Integrated (12/07/2021)  Stress: No Stress Concern Present (12/07/2021)  Tobacco Use: Medium Risk (11/22/2022)     Readmission Risk Interventions     No data to display

## 2022-11-27 DIAGNOSIS — D509 Iron deficiency anemia, unspecified: Secondary | ICD-10-CM | POA: Diagnosis not present

## 2022-11-27 DIAGNOSIS — D631 Anemia in chronic kidney disease: Secondary | ICD-10-CM | POA: Diagnosis not present

## 2022-11-27 DIAGNOSIS — N186 End stage renal disease: Secondary | ICD-10-CM | POA: Diagnosis not present

## 2022-11-27 DIAGNOSIS — Z992 Dependence on renal dialysis: Secondary | ICD-10-CM | POA: Diagnosis not present

## 2022-11-27 DIAGNOSIS — N250 Renal osteodystrophy: Secondary | ICD-10-CM | POA: Diagnosis not present

## 2022-11-29 DIAGNOSIS — M6281 Muscle weakness (generalized): Secondary | ICD-10-CM | POA: Diagnosis not present

## 2022-11-29 DIAGNOSIS — R6521 Severe sepsis with septic shock: Secondary | ICD-10-CM | POA: Diagnosis not present

## 2022-11-29 LAB — CULTURE, BLOOD (ROUTINE X 2)
Culture: NO GROWTH
Culture: NO GROWTH
Special Requests: ADEQUATE
Special Requests: ADEQUATE

## 2022-11-29 NOTE — Group Note (Deleted)

## 2022-11-30 ENCOUNTER — Telehealth (INDEPENDENT_AMBULATORY_CARE_PROVIDER_SITE_OTHER): Payer: Self-pay

## 2022-11-30 NOTE — Telephone Encounter (Signed)
Spoke with Luis Miller at [facility] [address] regarding the patient being scheduled for a left arm declot. Patient is scheduled on 12/01/22 with Dr. Gilda Crease with a 10:30 am arrival time the [facility]. Pre-procedure instructions were discussed and faxed to [facility] at [address]

## 2022-12-01 ENCOUNTER — Encounter
Admission: RE | Disposition: A | Discharge status: Skilled Nursing Facility | Payer: Self-pay | Source: Home / Self Care | Attending: Vascular Surgery

## 2022-12-01 ENCOUNTER — Ambulatory Visit
Admission: RE | Admit: 2022-12-01 | Discharge: 2022-12-01 | Disposition: A | Discharge status: Skilled Nursing Facility | Payer: Medicare Other | Attending: Vascular Surgery | Admitting: Vascular Surgery

## 2022-12-01 ENCOUNTER — Other Ambulatory Visit: Payer: Self-pay

## 2022-12-01 DIAGNOSIS — Z538 Procedure and treatment not carried out for other reasons: Secondary | ICD-10-CM | POA: Diagnosis not present

## 2022-12-01 DIAGNOSIS — N186 End stage renal disease: Secondary | ICD-10-CM | POA: Diagnosis not present

## 2022-12-01 LAB — POTASSIUM: Potassium: 4 mmol/L (ref 3.5–5.1)

## 2022-12-01 LAB — BUN: BUN: 80 mg/dL — ABNORMAL HIGH (ref 8–23)

## 2022-12-01 LAB — CREATININE, SERUM
Creatinine, Ser: 6.52 mg/dL — ABNORMAL HIGH (ref 0.61–1.24)
GFR, Estimated: 8 mL/min — ABNORMAL LOW (ref >60)

## 2022-12-01 SURGERY — PERIPHERAL VASCULAR THROMBECTOMY
Anesthesia: Moderate Sedation | Laterality: Left

## 2022-12-01 MED ORDER — SODIUM CHLORIDE 0.9 % IV SOLN: INTRAVENOUS | Status: DC

## 2022-12-01 MED ORDER — FAMOTIDINE 20 MG PO TABS: 40.0000 mg | ORAL_TABLET | Freq: Once | ORAL | Status: DC | PRN

## 2022-12-01 MED ORDER — MIDAZOLAM HCL 2 MG/ML PO SYRP: 8.0000 mg | ORAL_SOLUTION | Freq: Once | ORAL | Status: DC | PRN

## 2022-12-01 MED ORDER — CEFAZOLIN SODIUM-DEXTROSE 1-4 GM/50ML-% IV SOLN
INTRAVENOUS | Status: AC
  Filled 2022-12-01: qty 50

## 2022-12-01 MED ORDER — METHYLPREDNISOLONE SODIUM SUCC 125 MG IJ SOLR: 125.0000 mg | Freq: Once | INTRAMUSCULAR | Status: DC | PRN

## 2022-12-01 MED ORDER — HYDROMORPHONE HCL 1 MG/ML IJ SOLN: 1.0000 mg | Freq: Once | INTRAMUSCULAR | Status: DC | PRN

## 2022-12-01 MED ORDER — DIPHENHYDRAMINE HCL 50 MG/ML IJ SOLN: 50.0000 mg | Freq: Once | INTRAMUSCULAR | Status: DC | PRN

## 2022-12-01 MED ORDER — CEFAZOLIN SODIUM-DEXTROSE 1-4 GM/50ML-% IV SOLN: 1.0000 g | INTRAVENOUS | Status: DC

## 2022-12-01 MED ORDER — ONDANSETRON HCL 4 MG/2ML IJ SOLN: 4.0000 mg | Freq: Four times a day (QID) | INTRAMUSCULAR | Status: DC | PRN

## 2022-12-01 NOTE — H&P (View-Only) (Signed)
Patient ate bacon and eggs before coming for his procedure.  His procedure has been rescheduled for December 02, 2019

## 2022-12-01 NOTE — H&P (Signed)
Patient ate bacon and eggs before coming for his procedure.  His procedure has been rescheduled for December 02, 2019

## 2022-12-01 NOTE — Progress Notes (Signed)
Pt not NPO since midnight so procedure was not completed. Transport called for pt to return to [facility]. Called pt's care nurse at [facility] and they are aware that he is to return to [facility] at 1000 tomorrow and needs to be NPO at midnight.

## 2022-12-02 ENCOUNTER — Inpatient Hospital Stay: Payer: Medicare Other | Admitting: Physician Assistant

## 2022-12-02 ENCOUNTER — Encounter: Payer: Self-pay | Admitting: Urology

## 2022-12-02 ENCOUNTER — Other Ambulatory Visit: Payer: Self-pay

## 2022-12-02 ENCOUNTER — Encounter
Admission: RE | Disposition: A | Discharge status: Home / Self Care | Payer: Self-pay | Source: Home / Self Care | Attending: Vascular Surgery

## 2022-12-02 ENCOUNTER — Ambulatory Visit
Admission: RE | Admit: 2022-12-02 | Discharge: 2022-12-02 | Disposition: A | Discharge status: Home / Self Care | Payer: Medicare Other | Attending: Vascular Surgery | Admitting: Vascular Surgery

## 2022-12-02 DIAGNOSIS — T82868A Thrombosis of vascular prosthetic devices, implants and grafts, initial encounter: Secondary | ICD-10-CM | POA: Insufficient documentation

## 2022-12-02 DIAGNOSIS — Y841 Kidney dialysis as the cause of abnormal reaction of the patient, or of later complication, without mention of misadventure at the time of the procedure: Secondary | ICD-10-CM | POA: Diagnosis not present

## 2022-12-02 DIAGNOSIS — Z992 Dependence on renal dialysis: Secondary | ICD-10-CM | POA: Insufficient documentation

## 2022-12-02 DIAGNOSIS — N186 End stage renal disease: Secondary | ICD-10-CM

## 2022-12-02 HISTORY — PX: PERIPHERAL VASCULAR THROMBECTOMY: CATH118306

## 2022-12-02 LAB — POTASSIUM (ARMC VASCULAR LAB ONLY): Potassium (ARMC vascular lab): 4.6 mmol/L (ref 3.5–5.1)

## 2022-12-02 SURGERY — PERIPHERAL VASCULAR THROMBECTOMY
Anesthesia: Moderate Sedation | Laterality: Left

## 2022-12-02 MED ORDER — HEPARIN SODIUM (PORCINE) 1000 UNIT/ML IJ SOLN
INTRAMUSCULAR | Status: AC
  Filled 2022-12-02: qty 10

## 2022-12-02 MED ORDER — HEPARIN (PORCINE) IN NACL 2000-0.9 UNIT/L-% IV SOLN
INTRAVENOUS | Status: DC | PRN
  Administered 2022-12-02: 1000 mL

## 2022-12-02 MED ORDER — FAMOTIDINE 20 MG PO TABS: 40.0000 mg | ORAL_TABLET | Freq: Once | ORAL | Status: DC | PRN

## 2022-12-02 MED ORDER — MIDAZOLAM HCL 2 MG/2ML IJ SOLN
INTRAMUSCULAR | Status: AC
  Filled 2022-12-02: qty 2

## 2022-12-02 MED ORDER — HYDROMORPHONE HCL 1 MG/ML IJ SOLN: 1.0000 mg | Freq: Once | INTRAMUSCULAR | Status: DC | PRN

## 2022-12-02 MED ORDER — MIDAZOLAM HCL 2 MG/ML PO SYRP: 8.0000 mg | ORAL_SOLUTION | Freq: Once | ORAL | Status: DC | PRN

## 2022-12-02 MED ORDER — SODIUM CHLORIDE 0.9 % IV SOLN: INTRAVENOUS | Status: DC

## 2022-12-02 MED ORDER — IODIXANOL 320 MG/ML IV SOLN
INTRAVENOUS | Status: DC | PRN
  Administered 2022-12-02: 50 mL

## 2022-12-02 MED ORDER — FENTANYL CITRATE PF 50 MCG/ML IJ SOSY
PREFILLED_SYRINGE | INTRAMUSCULAR | Status: AC
  Filled 2022-12-02: qty 1

## 2022-12-02 MED ORDER — CEFAZOLIN SODIUM-DEXTROSE 1-4 GM/50ML-% IV SOLN
1.0000 g | INTRAVENOUS | Status: AC
  Administered 2022-12-02: 1 g via INTRAVENOUS

## 2022-12-02 MED ORDER — DIPHENHYDRAMINE HCL 50 MG/ML IJ SOLN: 50.0000 mg | Freq: Once | INTRAMUSCULAR | Status: DC | PRN

## 2022-12-02 MED ORDER — LIDOCAINE-EPINEPHRINE (PF) 1 %-1:200000 IJ SOLN
INTRAMUSCULAR | Status: DC | PRN
  Administered 2022-12-02: 10 mL

## 2022-12-02 MED ORDER — HEPARIN SODIUM (PORCINE) 1000 UNIT/ML IJ SOLN
INTRAMUSCULAR | Status: DC | PRN
  Administered 2022-12-02: 3000 [IU] via INTRAVENOUS

## 2022-12-02 MED ORDER — MIDAZOLAM HCL 2 MG/2ML IJ SOLN
INTRAMUSCULAR | Status: DC | PRN
  Administered 2022-12-02: 1 mg via INTRAVENOUS
  Administered 2022-12-02: .5 mg via INTRAVENOUS

## 2022-12-02 MED ORDER — METHYLPREDNISOLONE SODIUM SUCC 125 MG IJ SOLR: 125.0000 mg | Freq: Once | INTRAMUSCULAR | Status: DC | PRN

## 2022-12-02 MED ORDER — FENTANYL CITRATE (PF) 100 MCG/2ML IJ SOLN
INTRAMUSCULAR | Status: DC | PRN
  Administered 2022-12-02: 50 ug via INTRAVENOUS

## 2022-12-02 MED ORDER — CEFAZOLIN SODIUM-DEXTROSE 1-4 GM/50ML-% IV SOLN
INTRAVENOUS | Status: AC
  Filled 2022-12-02: qty 50

## 2022-12-02 MED ORDER — ONDANSETRON HCL 4 MG/2ML IJ SOLN: 4.0000 mg | Freq: Four times a day (QID) | INTRAMUSCULAR | Status: DC | PRN

## 2022-12-02 SURGICAL SUPPLY — 8 items
CANNULA 5F STIFF (CANNULA) IMPLANT
CATH EMBOLECTOMY 5FR (BALLOONS) IMPLANT
CATH THROMBEC 7F 65 CLEANER15 (MISCELLANEOUS) IMPLANT
COVER PROBE ULTRASOUND 5X96 (MISCELLANEOUS) IMPLANT
PACK ANGIOGRAPHY (CUSTOM PROCEDURE TRAY) ×1 IMPLANT
SHEATH BRITE TIP 6FRX5.5 (SHEATH) IMPLANT
SHEATH BRITE TIP 7FRX5.5 (SHEATH) IMPLANT
SUT MNCRL AB 4-0 PS2 18 (SUTURE) IMPLANT

## 2022-12-02 NOTE — Interval H&P Note (Signed)
History and Physical Interval Note:  12/02/2022 9:59 AM  Luis Miller.  has presented today for surgery, with the diagnosis of L arm declot    End Stage [facility].  The various methods of treatment have been discussed with the patient and family. After consideration of risks, benefits and other options for treatment, the patient has consented to  Procedure(s): PERIPHERAL VASCULAR THROMBECTOMY (Left) as a surgical intervention.  The patient's history has been reviewed, patient examined, no change in status, stable for surgery.  I have reviewed the patient's chart and labs.  Questions were answered to the patient's satisfaction.     Festus Barren

## 2022-12-02 NOTE — Op Note (Signed)
[facility]    OPERATIVE NOTE   PROCEDURE: 1.  Left brachial artery to axillary vein arteriovenous graft cannulation under ultrasound guidance in both a retrograde and then antegrade fashion crossing 2.  Left arm shuntogram and central venogram 3.  Mechanical thrombectomy to the left brachial artery to axillary vein AV graft with the 7 [place] cleaner device 4.  Fogarty embolectomy for residual arterial plug   PRE-OPERATIVE DIAGNOSIS: 1. ESRD 2.  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  The patient is scheduled for an attempted declot and shuntogram.  The patient is aware the risks include but are not limited to: bleeding, infection, thrombosis of the cannulated access, and possible anaphylactic reaction to the contrast.  The patient is aware of the risks of the procedure and elects to proceed forward.  DESCRIPTION: After full informed written consent was obtained, the patient was brought back to the angiography suite and placed supine upon the angiography table.  The patient was connected to monitoring equipment. Moderate conscious sedation was administered with a face to face encounter with the patient throughout the procedure with my supervision of the RN administering medicines and monitoring the patient's vital signs, pulse oximetry, telemetry and mental status throughout from the start of the procedure until the patient was taken to the  recovery room. The left arm was prepped and draped in the standard fashion for a percutaneous access intervention.  Under ultrasound guidance, the left brachial artery to axillary vein arteriovenous graft was cannulated with a micropuncture needle under direct ultrasound guidance due to the pulseless nature of the graft in both an antegrade and a retrograde fashion crossing, and permanent images were performed.  The microwire was advanced and the needle was exchanged for the a microsheath.  I then upsized to a 7 Fr Sheath and imaging was performed.  Hand injections were completed to image the access including the central venous system. This demonstrated no flow within the AV graft.  Based on the images, this patient will need extensive treatment to salvage the graft. I then gave the patient 3000 units of intravenous heparin.   An attempt to clear the arterial plug was done with 3 passes of the Fogarty embolectomy balloon. This resulted in resolution of the arterial plug, and clearance of the arterial side of the graft. The arterial outflow was seen to be intact distally. The retrograde sheath was removed. I then turned my attention to the thrombus in the distal graft and the axillary vein. Mechanical thrombectomy was performed using the 7 [place] cleaner device. This resulted in resolution of the thrombus and restored patency of the AV graft.     Based on the completion imaging, no further intervention is necessary.  The wire and balloon were removed from the sheath.  A 4-0 Monocryl purse-string suture was sewn around the sheath.  The sheath was removed while tying down the suture.  A sterile bandage was applied to the puncture site.  COMPLICATIONS: None  CONDITION: Stable  Festus Barren 12/02/2022 12:17 PM   This note was created with Dragon Medical transcription system. Any errors in dictation are purely unintentional.

## 2022-12-03 ENCOUNTER — Other Ambulatory Visit
Admission: RE | Admit: 2022-12-03 | Discharge: 2022-12-03 | Disposition: A | Discharge status: Home / Self Care | Payer: Medicare Other | Source: Ambulatory Visit | Attending: Nephrology | Admitting: Nephrology

## 2022-12-03 ENCOUNTER — Encounter: Payer: Self-pay | Admitting: Vascular Surgery

## 2022-12-03 ENCOUNTER — Ambulatory Visit: Payer: Medicare Other | Admitting: Psychiatry

## 2022-12-03 DIAGNOSIS — R7889 Finding of other specified substances, not normally found in blood: Secondary | ICD-10-CM | POA: Diagnosis not present

## 2022-12-03 DIAGNOSIS — I10 Essential (primary) hypertension: Secondary | ICD-10-CM | POA: Diagnosis not present

## 2022-12-03 DIAGNOSIS — Z992 Dependence on renal dialysis: Secondary | ICD-10-CM | POA: Diagnosis not present

## 2022-12-03 DIAGNOSIS — N186 End stage renal disease: Secondary | ICD-10-CM | POA: Diagnosis not present

## 2022-12-03 DIAGNOSIS — R6521 Severe sepsis with septic shock: Secondary | ICD-10-CM | POA: Diagnosis not present

## 2022-12-03 DIAGNOSIS — N250 Renal osteodystrophy: Secondary | ICD-10-CM | POA: Diagnosis not present

## 2022-12-03 DIAGNOSIS — D509 Iron deficiency anemia, unspecified: Secondary | ICD-10-CM | POA: Diagnosis not present

## 2022-12-03 DIAGNOSIS — D631 Anemia in chronic kidney disease: Secondary | ICD-10-CM | POA: Diagnosis not present

## 2022-12-03 LAB — LITHIUM LEVEL: Lithium Lvl: 0.43 mmol/L — ABNORMAL LOW (ref 0.60–1.20)

## 2022-12-04 DIAGNOSIS — N250 Renal osteodystrophy: Secondary | ICD-10-CM | POA: Diagnosis not present

## 2022-12-04 DIAGNOSIS — D509 Iron deficiency anemia, unspecified: Secondary | ICD-10-CM | POA: Diagnosis not present

## 2022-12-04 DIAGNOSIS — Z992 Dependence on renal dialysis: Secondary | ICD-10-CM | POA: Diagnosis not present

## 2022-12-04 DIAGNOSIS — N186 End stage renal disease: Secondary | ICD-10-CM | POA: Diagnosis not present

## 2022-12-04 DIAGNOSIS — D631 Anemia in chronic kidney disease: Secondary | ICD-10-CM | POA: Diagnosis not present

## 2022-12-06 ENCOUNTER — Telehealth: Payer: Self-pay | Admitting: Psychiatry

## 2022-12-06 NOTE — Telephone Encounter (Signed)
I have reviewed labs received from [facility],Li level - 0.43 -subtherapeutic.  Patient on dialysis, will continue current medication dosage for now.

## 2022-12-07 DIAGNOSIS — N186 End stage renal disease: Secondary | ICD-10-CM | POA: Diagnosis not present

## 2022-12-07 DIAGNOSIS — Z992 Dependence on renal dialysis: Secondary | ICD-10-CM | POA: Diagnosis not present

## 2022-12-07 DIAGNOSIS — D509 Iron deficiency anemia, unspecified: Secondary | ICD-10-CM | POA: Diagnosis not present

## 2022-12-07 DIAGNOSIS — D631 Anemia in chronic kidney disease: Secondary | ICD-10-CM | POA: Diagnosis not present

## 2022-12-07 DIAGNOSIS — N111 Chronic obstructive pyelonephritis: Secondary | ICD-10-CM | POA: Diagnosis not present

## 2022-12-07 DIAGNOSIS — N250 Renal osteodystrophy: Secondary | ICD-10-CM | POA: Diagnosis not present

## 2022-12-08 ENCOUNTER — Encounter: Payer: Self-pay | Admitting: Urology

## 2022-12-08 ENCOUNTER — Ambulatory Visit: Payer: Medicare Other | Admitting: Urology

## 2022-12-08 ENCOUNTER — Ambulatory Visit (INDEPENDENT_AMBULATORY_CARE_PROVIDER_SITE_OTHER): Payer: Medicare Other | Admitting: Urology

## 2022-12-08 VITALS — BP 130/70 | HR 74 | Ht 66.0 in | Wt 175.0 lb

## 2022-12-08 DIAGNOSIS — N138 Other obstructive and reflux uropathy: Secondary | ICD-10-CM

## 2022-12-08 DIAGNOSIS — Z992 Dependence on renal dialysis: Secondary | ICD-10-CM

## 2022-12-08 DIAGNOSIS — Z466 Encounter for fitting and adjustment of urinary device: Secondary | ICD-10-CM

## 2022-12-08 DIAGNOSIS — R310 Gross hematuria: Secondary | ICD-10-CM | POA: Diagnosis not present

## 2022-12-08 DIAGNOSIS — N186 End stage renal disease: Secondary | ICD-10-CM | POA: Diagnosis not present

## 2022-12-08 DIAGNOSIS — N401 Enlarged prostate with lower urinary tract symptoms: Secondary | ICD-10-CM | POA: Diagnosis not present

## 2022-12-08 LAB — BLADDER SCAN AMB NON-IMAGING: Scan Result: 57

## 2022-12-08 NOTE — Progress Notes (Signed)
Catheter Removal  Patient is present today for a catheter removal.  9ml of water was drained from the balloon. A 16FR foley cath was removed from the bladder, no complications were noted. Patient tolerated well.  Performed by: Ples Specter CMA   Follow up/ Additional notes: This afternoon
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
## 2022-12-09 DIAGNOSIS — D631 Anemia in chronic kidney disease: Secondary | ICD-10-CM | POA: Diagnosis not present

## 2022-12-09 DIAGNOSIS — Z992 Dependence on renal dialysis: Secondary | ICD-10-CM | POA: Diagnosis not present

## 2022-12-09 DIAGNOSIS — D509 Iron deficiency anemia, unspecified: Secondary | ICD-10-CM | POA: Diagnosis not present

## 2022-12-09 DIAGNOSIS — N186 End stage renal disease: Secondary | ICD-10-CM | POA: Diagnosis not present

## 2022-12-09 DIAGNOSIS — N250 Renal osteodystrophy: Secondary | ICD-10-CM | POA: Diagnosis not present

## 2022-12-10 ENCOUNTER — Other Ambulatory Visit: Payer: Self-pay | Admitting: Nurse Practitioner

## 2022-12-10 DIAGNOSIS — D649 Anemia, unspecified: Secondary | ICD-10-CM | POA: Diagnosis not present

## 2022-12-10 DIAGNOSIS — N111 Chronic obstructive pyelonephritis: Secondary | ICD-10-CM | POA: Diagnosis not present

## 2022-12-10 DIAGNOSIS — M6281 Muscle weakness (generalized): Secondary | ICD-10-CM | POA: Diagnosis not present

## 2022-12-10 DIAGNOSIS — Z992 Dependence on renal dialysis: Secondary | ICD-10-CM | POA: Diagnosis not present

## 2022-12-10 MED ORDER — CALCITRIOL 0.25 MCG PO CAPS: 0.2500 ug | ORAL_CAPSULE | Freq: Every day | ORAL | 0 refills | Status: DC

## 2022-12-11 DIAGNOSIS — D631 Anemia in chronic kidney disease: Secondary | ICD-10-CM | POA: Diagnosis not present

## 2022-12-11 DIAGNOSIS — N250 Renal osteodystrophy: Secondary | ICD-10-CM | POA: Diagnosis not present

## 2022-12-11 DIAGNOSIS — D509 Iron deficiency anemia, unspecified: Secondary | ICD-10-CM | POA: Diagnosis not present

## 2022-12-11 DIAGNOSIS — Z992 Dependence on renal dialysis: Secondary | ICD-10-CM | POA: Diagnosis not present

## 2022-12-11 DIAGNOSIS — N186 End stage renal disease: Secondary | ICD-10-CM | POA: Diagnosis not present

## 2022-12-13 DIAGNOSIS — F341 Dysthymic disorder: Secondary | ICD-10-CM | POA: Diagnosis not present

## 2022-12-13 DIAGNOSIS — Z556 Problems related to health literacy: Secondary | ICD-10-CM | POA: Diagnosis not present

## 2022-12-13 DIAGNOSIS — N12 Tubulo-interstitial nephritis, not specified as acute or chronic: Secondary | ICD-10-CM | POA: Diagnosis not present

## 2022-12-13 DIAGNOSIS — E559 Vitamin D deficiency, unspecified: Secondary | ICD-10-CM | POA: Diagnosis not present

## 2022-12-13 DIAGNOSIS — M109 Gout, unspecified: Secondary | ICD-10-CM | POA: Diagnosis not present

## 2022-12-13 DIAGNOSIS — G4733 Obstructive sleep apnea (adult) (pediatric): Secondary | ICD-10-CM | POA: Diagnosis not present

## 2022-12-13 DIAGNOSIS — Z8673 Personal history of transient ischemic attack (TIA), and cerebral infarction without residual deficits: Secondary | ICD-10-CM | POA: Diagnosis not present

## 2022-12-13 DIAGNOSIS — R6521 Severe sepsis with septic shock: Secondary | ICD-10-CM | POA: Diagnosis not present

## 2022-12-13 DIAGNOSIS — Z992 Dependence on renal dialysis: Secondary | ICD-10-CM | POA: Diagnosis not present

## 2022-12-13 DIAGNOSIS — N2581 Secondary hyperparathyroidism of renal origin: Secondary | ICD-10-CM | POA: Diagnosis not present

## 2022-12-13 DIAGNOSIS — Z85820 Personal history of malignant melanoma of skin: Secondary | ICD-10-CM | POA: Diagnosis not present

## 2022-12-13 DIAGNOSIS — I700 Atherosclerosis of aorta: Secondary | ICD-10-CM | POA: Diagnosis not present

## 2022-12-13 DIAGNOSIS — N186 End stage renal disease: Secondary | ICD-10-CM | POA: Diagnosis not present

## 2022-12-13 DIAGNOSIS — Z85828 Personal history of other malignant neoplasm of skin: Secondary | ICD-10-CM | POA: Diagnosis not present

## 2022-12-13 DIAGNOSIS — F314 Bipolar disorder, current episode depressed, severe, without psychotic features: Secondary | ICD-10-CM | POA: Diagnosis not present

## 2022-12-13 DIAGNOSIS — F609 Personality disorder, unspecified: Secondary | ICD-10-CM | POA: Diagnosis not present

## 2022-12-13 DIAGNOSIS — Z9181 History of falling: Secondary | ICD-10-CM | POA: Diagnosis not present

## 2022-12-13 DIAGNOSIS — Z604 Social exclusion and rejection: Secondary | ICD-10-CM | POA: Diagnosis not present

## 2022-12-13 DIAGNOSIS — A4151 Sepsis due to Escherichia coli [E. coli]: Secondary | ICD-10-CM | POA: Diagnosis not present

## 2022-12-13 DIAGNOSIS — Z8616 Personal history of COVID-19: Secondary | ICD-10-CM | POA: Diagnosis not present

## 2022-12-13 DIAGNOSIS — E785 Hyperlipidemia, unspecified: Secondary | ICD-10-CM | POA: Diagnosis not present

## 2022-12-13 DIAGNOSIS — I120 Hypertensive chronic kidney disease with stage 5 chronic kidney disease or end stage renal disease: Secondary | ICD-10-CM | POA: Diagnosis not present

## 2022-12-13 DIAGNOSIS — Z87891 Personal history of nicotine dependence: Secondary | ICD-10-CM | POA: Diagnosis not present

## 2022-12-13 DIAGNOSIS — I7300 Raynaud's syndrome without gangrene: Secondary | ICD-10-CM | POA: Diagnosis not present

## 2022-12-13 DIAGNOSIS — D631 Anemia in chronic kidney disease: Secondary | ICD-10-CM | POA: Diagnosis not present

## 2022-12-13 NOTE — Telephone Encounter (Signed)
Lab in date  Requested Prescriptions  Pending Prescriptions Disp Refills   loratadine (CLARITIN) 10 MG tablet [Pharmacy Med Name: LORATADINE 10 MG TABLET] 90 tablet 1    Sig: TAKE 1 TABLET BY MOUTH EVERY DAY     Ear, Nose, and Throat:  Antihistamines 2 Failed - 12/10/2022  3:53 PM      Failed - Cr in normal range and within 360 days    Creatinine  Date Value Ref Range Status  03/08/2014 2.22 (H) 0.60 - 1.30 mg/dL Final   Creatinine, Ser  Date Value Ref Range Status  12/01/2022 6.52 (H) 0.61 - 1.24 mg/dL Final   Creatinine, Urine  Date Value Ref Range Status  08/01/2019 194 mg/dL Final    Comment:    Performed at [facility], [address] [place], [place] [address]         Passed - Valid encounter within last 12 months    Recent Outpatient Visits           1 month ago Bipolar I disorder, most recent episode depressed (HCC)   [place] [facility] Mecum, Erin E, PA-C   9 months ago Hypertension associated with diabetes [facility])   [place] [facility] Larae Grooms, NP   1 year ago Annual physical exam   [place] [facility] Larae Grooms, NP   1 year ago Cellulitis of left lower leg   [place] [facility], Corrie Dandy T, NP   1 year ago Cellulitis of left lower leg   [place] [facility] Marjie Skiff, NP       Future Appointments             In 1 month Larae Grooms, NP [place] [facility]   In 5 months [place], Elana Alm [facility]

## 2022-12-14 DIAGNOSIS — D509 Iron deficiency anemia, unspecified: Secondary | ICD-10-CM | POA: Diagnosis not present

## 2022-12-14 DIAGNOSIS — N186 End stage renal disease: Secondary | ICD-10-CM | POA: Diagnosis not present

## 2022-12-14 DIAGNOSIS — N250 Renal osteodystrophy: Secondary | ICD-10-CM | POA: Diagnosis not present

## 2022-12-14 DIAGNOSIS — D631 Anemia in chronic kidney disease: Secondary | ICD-10-CM | POA: Diagnosis not present

## 2022-12-14 DIAGNOSIS — Z992 Dependence on renal dialysis: Secondary | ICD-10-CM | POA: Diagnosis not present

## 2022-12-15 ENCOUNTER — Telehealth: Payer: Self-pay | Admitting: Nurse Practitioner

## 2022-12-15 DIAGNOSIS — D2362 Other benign neoplasm of skin of left upper limb, including shoulder: Secondary | ICD-10-CM | POA: Diagnosis not present

## 2022-12-15 DIAGNOSIS — C44629 Squamous cell carcinoma of skin of left upper limb, including shoulder: Secondary | ICD-10-CM | POA: Diagnosis not present

## 2022-12-15 NOTE — Telephone Encounter (Signed)
Home Health Verbal Orders - Caller/Agency: Arline Asp with [facility]   Callback Number: [phone] (VM SECURED)  Requesting PT   Requesting Speech Therapy for Cognitive   Frequency:  PT: 2 week 3  1 week 6    Speech Therapy: 1 week 1

## 2022-12-15 NOTE — Telephone Encounter (Signed)
Okay for verbal orders.

## 2022-12-16 NOTE — Telephone Encounter (Signed)
Called and left vm for verbal orders per provider

## 2022-12-18 DIAGNOSIS — N250 Renal osteodystrophy: Secondary | ICD-10-CM | POA: Diagnosis not present

## 2022-12-18 DIAGNOSIS — Z992 Dependence on renal dialysis: Secondary | ICD-10-CM | POA: Diagnosis not present

## 2022-12-18 DIAGNOSIS — N186 End stage renal disease: Secondary | ICD-10-CM | POA: Diagnosis not present

## 2022-12-18 DIAGNOSIS — D631 Anemia in chronic kidney disease: Secondary | ICD-10-CM | POA: Diagnosis not present

## 2022-12-18 DIAGNOSIS — D509 Iron deficiency anemia, unspecified: Secondary | ICD-10-CM | POA: Diagnosis not present

## 2022-12-20 ENCOUNTER — Telehealth: Payer: Self-pay | Admitting: Nurse Practitioner

## 2022-12-20 DIAGNOSIS — R6521 Severe sepsis with septic shock: Secondary | ICD-10-CM | POA: Diagnosis not present

## 2022-12-20 DIAGNOSIS — I120 Hypertensive chronic kidney disease with stage 5 chronic kidney disease or end stage renal disease: Secondary | ICD-10-CM | POA: Diagnosis not present

## 2022-12-20 DIAGNOSIS — D631 Anemia in chronic kidney disease: Secondary | ICD-10-CM | POA: Diagnosis not present

## 2022-12-20 DIAGNOSIS — A4151 Sepsis due to Escherichia coli [E. coli]: Secondary | ICD-10-CM | POA: Diagnosis not present

## 2022-12-20 DIAGNOSIS — Z992 Dependence on renal dialysis: Secondary | ICD-10-CM | POA: Diagnosis not present

## 2022-12-20 DIAGNOSIS — N12 Tubulo-interstitial nephritis, not specified as acute or chronic: Secondary | ICD-10-CM | POA: Diagnosis not present

## 2022-12-20 DIAGNOSIS — N186 End stage renal disease: Secondary | ICD-10-CM | POA: Diagnosis not present

## 2022-12-20 NOTE — Telephone Encounter (Signed)
Copied from CRM [id number]. Topic: Quick Communication - Home Health Verbal Orders >> Dec 20, 2022 12:07 PM Clide Dales wrote: Caller/Agency: [facility] Callback Number: [phone] Requesting OT/PT/Skilled Nursing/Social Work/Speech Therapy: OT, Speech Therapy Frequency: 2w1, 1w2

## 2022-12-20 NOTE — Telephone Encounter (Signed)
Copied from CRM [id number]. Topic: General - Other >> Dec 20, 2022  3:27 PM Santiya F wrote: Reason for CRM: Darral Dash an Acupuncturist with [facility] is calling in to report pt declined the OT eval.

## 2022-12-20 NOTE — Telephone Encounter (Signed)
Copied from CRM [id number]. Topic: General - Other >> Dec 17, 2022  3:11 PM Santiya F wrote: Reason for CRM: Shawn with [facility] is calling in to inform Pt's PCP that pt missed a PT visit. Ines Bloomer can be reached at [phone]

## 2022-12-21 ENCOUNTER — Telehealth: Payer: Self-pay

## 2022-12-21 ENCOUNTER — Ambulatory Visit: Payer: Medicare Other | Admitting: Urology

## 2022-12-21 ENCOUNTER — Other Ambulatory Visit
Admission: RE | Admit: 2022-12-21 | Discharge: 2022-12-21 | Disposition: A | Discharge status: Home / Self Care | Payer: Medicare Other | Source: Ambulatory Visit | Attending: Nephrology | Admitting: Nephrology

## 2022-12-21 DIAGNOSIS — Z23 Encounter for immunization: Secondary | ICD-10-CM | POA: Diagnosis not present

## 2022-12-21 DIAGNOSIS — R7889 Finding of other specified substances, not normally found in blood: Secondary | ICD-10-CM | POA: Insufficient documentation

## 2022-12-21 DIAGNOSIS — Z992 Dependence on renal dialysis: Secondary | ICD-10-CM | POA: Diagnosis not present

## 2022-12-21 DIAGNOSIS — D509 Iron deficiency anemia, unspecified: Secondary | ICD-10-CM | POA: Diagnosis not present

## 2022-12-21 DIAGNOSIS — N250 Renal osteodystrophy: Secondary | ICD-10-CM | POA: Diagnosis not present

## 2022-12-21 DIAGNOSIS — D631 Anemia in chronic kidney disease: Secondary | ICD-10-CM | POA: Diagnosis not present

## 2022-12-21 DIAGNOSIS — N2581 Secondary hyperparathyroidism of renal origin: Secondary | ICD-10-CM | POA: Diagnosis not present

## 2022-12-21 DIAGNOSIS — E559 Vitamin D deficiency, unspecified: Secondary | ICD-10-CM | POA: Diagnosis not present

## 2022-12-21 DIAGNOSIS — N186 End stage renal disease: Secondary | ICD-10-CM | POA: Diagnosis not present

## 2022-12-21 LAB — LITHIUM LEVEL: Lithium Lvl: 0.37 mmol/L — ABNORMAL LOW (ref 0.60–1.20)

## 2022-12-21 NOTE — Telephone Encounter (Signed)
Noted  

## 2022-12-21 NOTE — Telephone Encounter (Signed)
called and explained that we need a release of information before speaking with her or his son. i faxed her two ROI, she states she will go by patients' house and get him to sign and she will fax back.

## 2022-12-21 NOTE — Telephone Encounter (Signed)
Do we have an ROI signed to communicate with this person ? Please check and let me know.

## 2022-12-21 NOTE — Telephone Encounter (Signed)
kathy from [facility] called would like to speak with you about Luis Miller memory imparment and serverity. Seh states that his son need educated on the serverity of his dad memory deficit and the son not providing care for his dad. pt was last seen on 10-18-22 and next appt 12-31-22

## 2022-12-22 DIAGNOSIS — N12 Tubulo-interstitial nephritis, not specified as acute or chronic: Secondary | ICD-10-CM | POA: Diagnosis not present

## 2022-12-22 DIAGNOSIS — R6521 Severe sepsis with septic shock: Secondary | ICD-10-CM | POA: Diagnosis not present

## 2022-12-22 DIAGNOSIS — N186 End stage renal disease: Secondary | ICD-10-CM | POA: Diagnosis not present

## 2022-12-22 DIAGNOSIS — A4151 Sepsis due to Escherichia coli [E. coli]: Secondary | ICD-10-CM | POA: Diagnosis not present

## 2022-12-22 DIAGNOSIS — D631 Anemia in chronic kidney disease: Secondary | ICD-10-CM | POA: Diagnosis not present

## 2022-12-22 DIAGNOSIS — I120 Hypertensive chronic kidney disease with stage 5 chronic kidney disease or end stage renal disease: Secondary | ICD-10-CM | POA: Diagnosis not present

## 2022-12-22 NOTE — Telephone Encounter (Signed)
Spoke with Lynden Ang from [facility] to provide verbal orders for the patient to have his scheduled appointment with Home Health on Friday. OK for verbals orders since [employer], PA is out of the office until tomorrow 12/23/22. Cathy verbalized understanding and has no further questions.

## 2022-12-22 NOTE — Telephone Encounter (Signed)
Cathy with [facility] called to get an update on the verbal orders that were requested on 9/30 for OT and Speech Therapy. [phone].

## 2022-12-23 ENCOUNTER — Telehealth: Payer: Self-pay | Admitting: Nurse Practitioner

## 2022-12-23 ENCOUNTER — Telehealth: Payer: Self-pay | Admitting: Psychiatry

## 2022-12-23 DIAGNOSIS — N2581 Secondary hyperparathyroidism of renal origin: Secondary | ICD-10-CM | POA: Diagnosis not present

## 2022-12-23 DIAGNOSIS — D509 Iron deficiency anemia, unspecified: Secondary | ICD-10-CM | POA: Diagnosis not present

## 2022-12-23 DIAGNOSIS — Z23 Encounter for immunization: Secondary | ICD-10-CM | POA: Diagnosis not present

## 2022-12-23 DIAGNOSIS — Z992 Dependence on renal dialysis: Secondary | ICD-10-CM | POA: Diagnosis not present

## 2022-12-23 DIAGNOSIS — N186 End stage renal disease: Secondary | ICD-10-CM | POA: Diagnosis not present

## 2022-12-23 DIAGNOSIS — D631 Anemia in chronic kidney disease: Secondary | ICD-10-CM | POA: Diagnosis not present

## 2022-12-23 NOTE — Telephone Encounter (Signed)
Pt's Son reporting needing a PA for calcitRIOL (ROCALTROL) 0.25 MCG capsule [[id number]] and is this a medication that the patient needs to continue to take? [facility] - [place], [place] - [address] Phone: [phone]  Fax: [phone]      Please advise CB- [phone]

## 2022-12-23 NOTE — Telephone Encounter (Signed)
I have reviewed lithium level received from [facility] -dated 12/21/2022-0.37. Patient to continue current medication regimen.

## 2022-12-24 DIAGNOSIS — I120 Hypertensive chronic kidney disease with stage 5 chronic kidney disease or end stage renal disease: Secondary | ICD-10-CM | POA: Diagnosis not present

## 2022-12-24 DIAGNOSIS — A4151 Sepsis due to Escherichia coli [E. coli]: Secondary | ICD-10-CM | POA: Diagnosis not present

## 2022-12-24 DIAGNOSIS — D631 Anemia in chronic kidney disease: Secondary | ICD-10-CM | POA: Diagnosis not present

## 2022-12-24 DIAGNOSIS — N12 Tubulo-interstitial nephritis, not specified as acute or chronic: Secondary | ICD-10-CM | POA: Diagnosis not present

## 2022-12-24 DIAGNOSIS — N186 End stage renal disease: Secondary | ICD-10-CM | POA: Diagnosis not present

## 2022-12-24 DIAGNOSIS — R6521 Severe sepsis with septic shock: Secondary | ICD-10-CM | POA: Diagnosis not present

## 2022-12-24 NOTE — Telephone Encounter (Signed)
left message for patient to continue with current medications.and labwork results.

## 2022-12-25 DIAGNOSIS — Z992 Dependence on renal dialysis: Secondary | ICD-10-CM | POA: Diagnosis not present

## 2022-12-25 DIAGNOSIS — Z23 Encounter for immunization: Secondary | ICD-10-CM | POA: Diagnosis not present

## 2022-12-25 DIAGNOSIS — D631 Anemia in chronic kidney disease: Secondary | ICD-10-CM | POA: Diagnosis not present

## 2022-12-25 DIAGNOSIS — N2581 Secondary hyperparathyroidism of renal origin: Secondary | ICD-10-CM | POA: Diagnosis not present

## 2022-12-25 DIAGNOSIS — N186 End stage renal disease: Secondary | ICD-10-CM | POA: Diagnosis not present

## 2022-12-25 DIAGNOSIS — D509 Iron deficiency anemia, unspecified: Secondary | ICD-10-CM | POA: Diagnosis not present

## 2022-12-27 ENCOUNTER — Telehealth: Payer: Self-pay | Admitting: Nurse Practitioner

## 2022-12-27 DIAGNOSIS — N12 Tubulo-interstitial nephritis, not specified as acute or chronic: Secondary | ICD-10-CM | POA: Diagnosis not present

## 2022-12-27 DIAGNOSIS — I120 Hypertensive chronic kidney disease with stage 5 chronic kidney disease or end stage renal disease: Secondary | ICD-10-CM | POA: Diagnosis not present

## 2022-12-27 DIAGNOSIS — R6521 Severe sepsis with septic shock: Secondary | ICD-10-CM | POA: Diagnosis not present

## 2022-12-27 DIAGNOSIS — A4151 Sepsis due to Escherichia coli [E. coli]: Secondary | ICD-10-CM | POA: Diagnosis not present

## 2022-12-27 DIAGNOSIS — D631 Anemia in chronic kidney disease: Secondary | ICD-10-CM | POA: Diagnosis not present

## 2022-12-27 DIAGNOSIS — N186 End stage renal disease: Secondary | ICD-10-CM | POA: Diagnosis not present

## 2022-12-27 NOTE — Telephone Encounter (Signed)
Sounds like his Nephrology provider will need to be prescribing the Calcitriol as part of his dialysis. Can we contact them or tell the family they need to ask that provider for refills so it will be covered

## 2022-12-27 NOTE — Telephone Encounter (Signed)
Home Health Verbal Orders - Caller/Agency: Lynden Ang from [facility] Callback Number: [phone] Requesting to continue with Speech Therapy:  Frequency: 2x3w  Can leave msg on confidential vm

## 2022-12-27 NOTE — Telephone Encounter (Signed)
Prior Authorization was initiate via CoverMyMeds for prescription of Calcitriol 0.25 MG. Awaiting determination from patient's insurance.   [id number]

## 2022-12-27 NOTE — Telephone Encounter (Signed)
Okay for verbal order

## 2022-12-27 NOTE — Telephone Encounter (Signed)
Patient son [name] was made aware of Denny Peon Mecum, PA recommendations. Richard verbalized understanding and has no further questions at this time.

## 2022-12-27 NOTE — Telephone Encounter (Signed)
Called and LVM giving verbal orders per Karen.  

## 2022-12-28 DIAGNOSIS — Z23 Encounter for immunization: Secondary | ICD-10-CM | POA: Diagnosis not present

## 2022-12-28 DIAGNOSIS — N2581 Secondary hyperparathyroidism of renal origin: Secondary | ICD-10-CM | POA: Diagnosis not present

## 2022-12-28 DIAGNOSIS — N186 End stage renal disease: Secondary | ICD-10-CM | POA: Diagnosis not present

## 2022-12-28 DIAGNOSIS — Z992 Dependence on renal dialysis: Secondary | ICD-10-CM | POA: Diagnosis not present

## 2022-12-28 DIAGNOSIS — D509 Iron deficiency anemia, unspecified: Secondary | ICD-10-CM | POA: Diagnosis not present

## 2022-12-28 DIAGNOSIS — D631 Anemia in chronic kidney disease: Secondary | ICD-10-CM | POA: Diagnosis not present

## 2022-12-28 NOTE — Telephone Encounter (Signed)
Returned call to Ms.[name] at [employer]. She is worried about patient's memory/cognitive function.  She is also worried about the fact that he does not have much social support.  Although he does have a son, son is not able to provide much support since he lives in [place] and works there.  She hence wanted to reach out and conveyed this message regarding recent cognitive/memory problems.  Discussed with Home health that he does have an appointment with this provider tomorrow and we can assess.  Will consider referral to neurology after evaluation tomorrow.  However will have nurse communicate this with primary care provider as well so they can connect him with services as needed since he does not have a lot of social support right now.  Please contact primary care provider and let them know so patient can be connected with support in the community since he is on dialysis and has possible cognitive dysfunction with not a lot of social support.

## 2022-12-28 NOTE — Telephone Encounter (Signed)
received ROI so you can call her now. ROI is in your basket. needs to be scanned.

## 2022-12-29 ENCOUNTER — Ambulatory Visit (INDEPENDENT_AMBULATORY_CARE_PROVIDER_SITE_OTHER): Payer: Medicare Other | Admitting: Psychiatry

## 2022-12-29 ENCOUNTER — Telehealth (INDEPENDENT_AMBULATORY_CARE_PROVIDER_SITE_OTHER): Payer: Self-pay

## 2022-12-29 ENCOUNTER — Telehealth: Payer: Self-pay | Admitting: Psychiatry

## 2022-12-29 ENCOUNTER — Encounter: Payer: Self-pay | Admitting: Psychiatry

## 2022-12-29 VITALS — BP 130/72 | HR 81 | Temp 98.0°F | Ht 66.0 in | Wt 170.6 lb

## 2022-12-29 DIAGNOSIS — G3184 Mild cognitive impairment, so stated: Secondary | ICD-10-CM | POA: Diagnosis not present

## 2022-12-29 DIAGNOSIS — F314 Bipolar disorder, current episode depressed, severe, without psychotic features: Secondary | ICD-10-CM

## 2022-12-29 DIAGNOSIS — Z79899 Other long term (current) drug therapy: Secondary | ICD-10-CM

## 2022-12-29 DIAGNOSIS — G4701 Insomnia due to medical condition: Secondary | ICD-10-CM | POA: Diagnosis not present

## 2022-12-29 MED ORDER — LITHIUM CARBONATE 150 MG PO CAPS: 300.0000 mg | ORAL_CAPSULE | Freq: Every day | ORAL | 1 refills | Status: DC

## 2022-12-29 MED ORDER — OLANZAPINE 5 MG PO TABS
5.0000 mg | ORAL_TABLET | Freq: Every day | ORAL | 1 refills | Status: DC
Start: 2022-12-29 — End: 2023-03-09

## 2022-12-29 NOTE — Telephone Encounter (Signed)
Dever,richard,POA Shari Heritage) [phone]   Above is contact information for son who is involved in his care. They may have to reach son.
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
## 2022-12-29 NOTE — Telephone Encounter (Signed)
Message from [facility], [place]. Received referral on patient. Reached out to schedule however was reluctant to schedule. States he was unaware of the referral to see Dr. Clelia Croft.  KC wants to know if someone can call him to remind him it was being sent or if family member can call as long on HIPAA and can schedule. Call [phone]

## 2022-12-29 NOTE — Telephone Encounter (Signed)
Patients son called to reschedule Luis Miller's appt. Patient will be at [facility] tomorrow at the same time. He stated he has called multiple times this week to reschedule and nobody has returned his call.   Verbal message given to Tanya @  3:10pm.  She stated she had already called and rescheduled.  Patient was still on schedule for tomorrow.

## 2022-12-30 ENCOUNTER — Ambulatory Visit (INDEPENDENT_AMBULATORY_CARE_PROVIDER_SITE_OTHER): Payer: Medicare Other | Admitting: Nurse Practitioner

## 2022-12-30 ENCOUNTER — Encounter (INDEPENDENT_AMBULATORY_CARE_PROVIDER_SITE_OTHER): Payer: Medicare Other

## 2022-12-30 DIAGNOSIS — D509 Iron deficiency anemia, unspecified: Secondary | ICD-10-CM | POA: Diagnosis not present

## 2022-12-30 DIAGNOSIS — N2581 Secondary hyperparathyroidism of renal origin: Secondary | ICD-10-CM | POA: Diagnosis not present

## 2022-12-30 DIAGNOSIS — N186 End stage renal disease: Secondary | ICD-10-CM | POA: Diagnosis not present

## 2022-12-30 DIAGNOSIS — D631 Anemia in chronic kidney disease: Secondary | ICD-10-CM | POA: Diagnosis not present

## 2022-12-30 DIAGNOSIS — Z23 Encounter for immunization: Secondary | ICD-10-CM | POA: Diagnosis not present

## 2022-12-30 DIAGNOSIS — Z992 Dependence on renal dialysis: Secondary | ICD-10-CM | POA: Diagnosis not present

## 2022-12-30 NOTE — Telephone Encounter (Signed)
Thank you :)
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
## 2022-12-31 ENCOUNTER — Ambulatory Visit: Payer: Medicare Other | Admitting: Psychiatry

## 2023-01-01 DIAGNOSIS — Z992 Dependence on renal dialysis: Secondary | ICD-10-CM | POA: Diagnosis not present

## 2023-01-01 DIAGNOSIS — D509 Iron deficiency anemia, unspecified: Secondary | ICD-10-CM | POA: Diagnosis not present

## 2023-01-01 DIAGNOSIS — D631 Anemia in chronic kidney disease: Secondary | ICD-10-CM | POA: Diagnosis not present

## 2023-01-01 DIAGNOSIS — N2581 Secondary hyperparathyroidism of renal origin: Secondary | ICD-10-CM | POA: Diagnosis not present

## 2023-01-01 DIAGNOSIS — N186 End stage renal disease: Secondary | ICD-10-CM | POA: Diagnosis not present

## 2023-01-01 DIAGNOSIS — Z23 Encounter for immunization: Secondary | ICD-10-CM | POA: Diagnosis not present

## 2023-01-04 DIAGNOSIS — D631 Anemia in chronic kidney disease: Secondary | ICD-10-CM | POA: Diagnosis not present

## 2023-01-04 DIAGNOSIS — N2581 Secondary hyperparathyroidism of renal origin: Secondary | ICD-10-CM | POA: Diagnosis not present

## 2023-01-04 DIAGNOSIS — D509 Iron deficiency anemia, unspecified: Secondary | ICD-10-CM | POA: Diagnosis not present

## 2023-01-04 DIAGNOSIS — Z23 Encounter for immunization: Secondary | ICD-10-CM | POA: Diagnosis not present

## 2023-01-04 DIAGNOSIS — Z992 Dependence on renal dialysis: Secondary | ICD-10-CM | POA: Diagnosis not present

## 2023-01-04 DIAGNOSIS — N186 End stage renal disease: Secondary | ICD-10-CM | POA: Diagnosis not present

## 2023-01-05 DIAGNOSIS — A4151 Sepsis due to Escherichia coli [E. coli]: Secondary | ICD-10-CM | POA: Diagnosis not present

## 2023-01-05 DIAGNOSIS — R6521 Severe sepsis with septic shock: Secondary | ICD-10-CM | POA: Diagnosis not present

## 2023-01-05 DIAGNOSIS — N12 Tubulo-interstitial nephritis, not specified as acute or chronic: Secondary | ICD-10-CM | POA: Diagnosis not present

## 2023-01-05 DIAGNOSIS — I120 Hypertensive chronic kidney disease with stage 5 chronic kidney disease or end stage renal disease: Secondary | ICD-10-CM | POA: Diagnosis not present

## 2023-01-05 DIAGNOSIS — N186 End stage renal disease: Secondary | ICD-10-CM | POA: Diagnosis not present

## 2023-01-05 DIAGNOSIS — D631 Anemia in chronic kidney disease: Secondary | ICD-10-CM | POA: Diagnosis not present

## 2023-01-06 DIAGNOSIS — N186 End stage renal disease: Secondary | ICD-10-CM | POA: Diagnosis not present

## 2023-01-06 DIAGNOSIS — N2581 Secondary hyperparathyroidism of renal origin: Secondary | ICD-10-CM | POA: Diagnosis not present

## 2023-01-06 DIAGNOSIS — Z23 Encounter for immunization: Secondary | ICD-10-CM | POA: Diagnosis not present

## 2023-01-06 DIAGNOSIS — D509 Iron deficiency anemia, unspecified: Secondary | ICD-10-CM | POA: Diagnosis not present

## 2023-01-06 DIAGNOSIS — D631 Anemia in chronic kidney disease: Secondary | ICD-10-CM | POA: Diagnosis not present

## 2023-01-06 DIAGNOSIS — Z992 Dependence on renal dialysis: Secondary | ICD-10-CM | POA: Diagnosis not present

## 2023-01-08 DIAGNOSIS — D509 Iron deficiency anemia, unspecified: Secondary | ICD-10-CM | POA: Diagnosis not present

## 2023-01-08 DIAGNOSIS — N2581 Secondary hyperparathyroidism of renal origin: Secondary | ICD-10-CM | POA: Diagnosis not present

## 2023-01-08 DIAGNOSIS — Z23 Encounter for immunization: Secondary | ICD-10-CM | POA: Diagnosis not present

## 2023-01-08 DIAGNOSIS — N186 End stage renal disease: Secondary | ICD-10-CM | POA: Diagnosis not present

## 2023-01-08 DIAGNOSIS — Z992 Dependence on renal dialysis: Secondary | ICD-10-CM | POA: Diagnosis not present

## 2023-01-08 DIAGNOSIS — D631 Anemia in chronic kidney disease: Secondary | ICD-10-CM | POA: Diagnosis not present

## 2023-01-10 DIAGNOSIS — N186 End stage renal disease: Secondary | ICD-10-CM | POA: Diagnosis not present

## 2023-01-10 DIAGNOSIS — R6521 Severe sepsis with septic shock: Secondary | ICD-10-CM | POA: Diagnosis not present

## 2023-01-10 DIAGNOSIS — I120 Hypertensive chronic kidney disease with stage 5 chronic kidney disease or end stage renal disease: Secondary | ICD-10-CM | POA: Diagnosis not present

## 2023-01-10 DIAGNOSIS — A4151 Sepsis due to Escherichia coli [E. coli]: Secondary | ICD-10-CM | POA: Diagnosis not present

## 2023-01-10 DIAGNOSIS — N12 Tubulo-interstitial nephritis, not specified as acute or chronic: Secondary | ICD-10-CM | POA: Diagnosis not present

## 2023-01-10 DIAGNOSIS — D631 Anemia in chronic kidney disease: Secondary | ICD-10-CM | POA: Diagnosis not present

## 2023-01-11 DIAGNOSIS — D631 Anemia in chronic kidney disease: Secondary | ICD-10-CM | POA: Diagnosis not present

## 2023-01-11 DIAGNOSIS — Z23 Encounter for immunization: Secondary | ICD-10-CM | POA: Diagnosis not present

## 2023-01-11 DIAGNOSIS — Z992 Dependence on renal dialysis: Secondary | ICD-10-CM | POA: Diagnosis not present

## 2023-01-11 DIAGNOSIS — D509 Iron deficiency anemia, unspecified: Secondary | ICD-10-CM | POA: Diagnosis not present

## 2023-01-11 DIAGNOSIS — N186 End stage renal disease: Secondary | ICD-10-CM | POA: Diagnosis not present

## 2023-01-11 DIAGNOSIS — N2581 Secondary hyperparathyroidism of renal origin: Secondary | ICD-10-CM | POA: Diagnosis not present

## 2023-01-12 DIAGNOSIS — I7300 Raynaud's syndrome without gangrene: Secondary | ICD-10-CM | POA: Diagnosis not present

## 2023-01-12 DIAGNOSIS — R6521 Severe sepsis with septic shock: Secondary | ICD-10-CM | POA: Diagnosis not present

## 2023-01-12 DIAGNOSIS — G4733 Obstructive sleep apnea (adult) (pediatric): Secondary | ICD-10-CM | POA: Diagnosis not present

## 2023-01-12 DIAGNOSIS — Z992 Dependence on renal dialysis: Secondary | ICD-10-CM | POA: Diagnosis not present

## 2023-01-12 DIAGNOSIS — D631 Anemia in chronic kidney disease: Secondary | ICD-10-CM | POA: Diagnosis not present

## 2023-01-12 DIAGNOSIS — F341 Dysthymic disorder: Secondary | ICD-10-CM | POA: Diagnosis not present

## 2023-01-12 DIAGNOSIS — N186 End stage renal disease: Secondary | ICD-10-CM | POA: Diagnosis not present

## 2023-01-12 DIAGNOSIS — E785 Hyperlipidemia, unspecified: Secondary | ICD-10-CM | POA: Diagnosis not present

## 2023-01-12 DIAGNOSIS — A4151 Sepsis due to Escherichia coli [E. coli]: Secondary | ICD-10-CM | POA: Diagnosis not present

## 2023-01-12 DIAGNOSIS — Z604 Social exclusion and rejection: Secondary | ICD-10-CM | POA: Diagnosis not present

## 2023-01-12 DIAGNOSIS — E559 Vitamin D deficiency, unspecified: Secondary | ICD-10-CM | POA: Diagnosis not present

## 2023-01-12 DIAGNOSIS — Z87891 Personal history of nicotine dependence: Secondary | ICD-10-CM | POA: Diagnosis not present

## 2023-01-12 DIAGNOSIS — N2581 Secondary hyperparathyroidism of renal origin: Secondary | ICD-10-CM | POA: Diagnosis not present

## 2023-01-12 DIAGNOSIS — Z8673 Personal history of transient ischemic attack (TIA), and cerebral infarction without residual deficits: Secondary | ICD-10-CM | POA: Diagnosis not present

## 2023-01-12 DIAGNOSIS — Z85820 Personal history of malignant melanoma of skin: Secondary | ICD-10-CM | POA: Diagnosis not present

## 2023-01-12 DIAGNOSIS — Z556 Problems related to health literacy: Secondary | ICD-10-CM | POA: Diagnosis not present

## 2023-01-12 DIAGNOSIS — M109 Gout, unspecified: Secondary | ICD-10-CM | POA: Diagnosis not present

## 2023-01-12 DIAGNOSIS — F314 Bipolar disorder, current episode depressed, severe, without psychotic features: Secondary | ICD-10-CM | POA: Diagnosis not present

## 2023-01-12 DIAGNOSIS — Z8616 Personal history of COVID-19: Secondary | ICD-10-CM | POA: Diagnosis not present

## 2023-01-12 DIAGNOSIS — Z9181 History of falling: Secondary | ICD-10-CM | POA: Diagnosis not present

## 2023-01-12 DIAGNOSIS — N12 Tubulo-interstitial nephritis, not specified as acute or chronic: Secondary | ICD-10-CM | POA: Diagnosis not present

## 2023-01-12 DIAGNOSIS — Z85828 Personal history of other malignant neoplasm of skin: Secondary | ICD-10-CM | POA: Diagnosis not present

## 2023-01-12 DIAGNOSIS — I120 Hypertensive chronic kidney disease with stage 5 chronic kidney disease or end stage renal disease: Secondary | ICD-10-CM | POA: Diagnosis not present

## 2023-01-12 DIAGNOSIS — I700 Atherosclerosis of aorta: Secondary | ICD-10-CM | POA: Diagnosis not present

## 2023-01-12 DIAGNOSIS — F609 Personality disorder, unspecified: Secondary | ICD-10-CM | POA: Diagnosis not present

## 2023-01-13 DIAGNOSIS — N186 End stage renal disease: Secondary | ICD-10-CM | POA: Diagnosis not present

## 2023-01-13 DIAGNOSIS — D509 Iron deficiency anemia, unspecified: Secondary | ICD-10-CM | POA: Diagnosis not present

## 2023-01-13 DIAGNOSIS — D631 Anemia in chronic kidney disease: Secondary | ICD-10-CM | POA: Diagnosis not present

## 2023-01-13 DIAGNOSIS — N2581 Secondary hyperparathyroidism of renal origin: Secondary | ICD-10-CM | POA: Diagnosis not present

## 2023-01-13 DIAGNOSIS — Z23 Encounter for immunization: Secondary | ICD-10-CM | POA: Diagnosis not present

## 2023-01-13 DIAGNOSIS — Z992 Dependence on renal dialysis: Secondary | ICD-10-CM | POA: Diagnosis not present

## 2023-01-15 DIAGNOSIS — Z23 Encounter for immunization: Secondary | ICD-10-CM | POA: Diagnosis not present

## 2023-01-15 DIAGNOSIS — N2581 Secondary hyperparathyroidism of renal origin: Secondary | ICD-10-CM | POA: Diagnosis not present

## 2023-01-15 DIAGNOSIS — D631 Anemia in chronic kidney disease: Secondary | ICD-10-CM | POA: Diagnosis not present

## 2023-01-15 DIAGNOSIS — Z992 Dependence on renal dialysis: Secondary | ICD-10-CM | POA: Diagnosis not present

## 2023-01-15 DIAGNOSIS — D509 Iron deficiency anemia, unspecified: Secondary | ICD-10-CM | POA: Diagnosis not present

## 2023-01-15 DIAGNOSIS — N186 End stage renal disease: Secondary | ICD-10-CM | POA: Diagnosis not present

## 2023-01-17 DIAGNOSIS — R6521 Severe sepsis with septic shock: Secondary | ICD-10-CM | POA: Diagnosis not present

## 2023-01-17 DIAGNOSIS — D631 Anemia in chronic kidney disease: Secondary | ICD-10-CM | POA: Diagnosis not present

## 2023-01-17 DIAGNOSIS — N12 Tubulo-interstitial nephritis, not specified as acute or chronic: Secondary | ICD-10-CM | POA: Diagnosis not present

## 2023-01-17 DIAGNOSIS — A4151 Sepsis due to Escherichia coli [E. coli]: Secondary | ICD-10-CM | POA: Diagnosis not present

## 2023-01-17 DIAGNOSIS — I120 Hypertensive chronic kidney disease with stage 5 chronic kidney disease or end stage renal disease: Secondary | ICD-10-CM | POA: Diagnosis not present

## 2023-01-17 DIAGNOSIS — N186 End stage renal disease: Secondary | ICD-10-CM | POA: Diagnosis not present

## 2023-01-18 DIAGNOSIS — Z23 Encounter for immunization: Secondary | ICD-10-CM | POA: Diagnosis not present

## 2023-01-18 DIAGNOSIS — N186 End stage renal disease: Secondary | ICD-10-CM | POA: Diagnosis not present

## 2023-01-18 DIAGNOSIS — Z992 Dependence on renal dialysis: Secondary | ICD-10-CM | POA: Diagnosis not present

## 2023-01-18 DIAGNOSIS — D509 Iron deficiency anemia, unspecified: Secondary | ICD-10-CM | POA: Diagnosis not present

## 2023-01-18 DIAGNOSIS — D631 Anemia in chronic kidney disease: Secondary | ICD-10-CM | POA: Diagnosis not present

## 2023-01-18 DIAGNOSIS — N2581 Secondary hyperparathyroidism of renal origin: Secondary | ICD-10-CM | POA: Diagnosis not present

## 2023-01-19 ENCOUNTER — Ambulatory Visit (INDEPENDENT_AMBULATORY_CARE_PROVIDER_SITE_OTHER): Payer: Medicare Other | Admitting: Psychiatry

## 2023-01-19 ENCOUNTER — Encounter: Payer: Self-pay | Admitting: Psychiatry

## 2023-01-19 VITALS — BP 122/71 | HR 66 | Temp 98.1°F | Ht 66.0 in | Wt 174.4 lb

## 2023-01-19 DIAGNOSIS — G4701 Insomnia due to medical condition: Secondary | ICD-10-CM | POA: Diagnosis not present

## 2023-01-19 DIAGNOSIS — G3184 Mild cognitive impairment, so stated: Secondary | ICD-10-CM

## 2023-01-19 DIAGNOSIS — R6521 Severe sepsis with septic shock: Secondary | ICD-10-CM | POA: Diagnosis not present

## 2023-01-19 DIAGNOSIS — I120 Hypertensive chronic kidney disease with stage 5 chronic kidney disease or end stage renal disease: Secondary | ICD-10-CM | POA: Diagnosis not present

## 2023-01-19 DIAGNOSIS — F3175 Bipolar disorder, in partial remission, most recent episode depressed: Secondary | ICD-10-CM

## 2023-01-19 DIAGNOSIS — N186 End stage renal disease: Secondary | ICD-10-CM | POA: Diagnosis not present

## 2023-01-19 DIAGNOSIS — D631 Anemia in chronic kidney disease: Secondary | ICD-10-CM | POA: Diagnosis not present

## 2023-01-19 DIAGNOSIS — A4151 Sepsis due to Escherichia coli [E. coli]: Secondary | ICD-10-CM | POA: Diagnosis not present

## 2023-01-19 DIAGNOSIS — N12 Tubulo-interstitial nephritis, not specified as acute or chronic: Secondary | ICD-10-CM | POA: Diagnosis not present

## 2023-01-19 MED ORDER — BUPROPION HCL ER (XL) 150 MG PO TB24: 150.0000 mg | ORAL_TABLET | Freq: Every day | ORAL | 3 refills | Status: DC

## 2023-01-19 MED ORDER — DOXEPIN HCL 50 MG PO CAPS
50.0000 mg | ORAL_CAPSULE | Freq: Every day | ORAL | 1 refills | Status: DC
Start: 2023-01-19 — End: 2023-03-24

## 2023-01-19 MED ORDER — LAMOTRIGINE 25 MG PO TABS: 25.0000 mg | ORAL_TABLET | ORAL | 3 refills | Status: DC
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
## 2023-01-20 ENCOUNTER — Other Ambulatory Visit
Admission: RE | Admit: 2023-01-20 | Discharge: 2023-01-20 | Disposition: A | Discharge status: Home / Self Care | Payer: Medicare Other | Source: Ambulatory Visit | Attending: Nephrology | Admitting: Nephrology

## 2023-01-20 DIAGNOSIS — D631 Anemia in chronic kidney disease: Secondary | ICD-10-CM | POA: Diagnosis not present

## 2023-01-20 DIAGNOSIS — R7889 Finding of other specified substances, not normally found in blood: Secondary | ICD-10-CM | POA: Insufficient documentation

## 2023-01-20 DIAGNOSIS — Z23 Encounter for immunization: Secondary | ICD-10-CM | POA: Diagnosis not present

## 2023-01-20 DIAGNOSIS — Z992 Dependence on renal dialysis: Secondary | ICD-10-CM | POA: Diagnosis not present

## 2023-01-20 DIAGNOSIS — D509 Iron deficiency anemia, unspecified: Secondary | ICD-10-CM | POA: Diagnosis not present

## 2023-01-20 DIAGNOSIS — N2581 Secondary hyperparathyroidism of renal origin: Secondary | ICD-10-CM | POA: Diagnosis not present

## 2023-01-20 DIAGNOSIS — N186 End stage renal disease: Secondary | ICD-10-CM | POA: Diagnosis not present

## 2023-01-20 LAB — LITHIUM LEVEL: Lithium Lvl: 0.78 mmol/L (ref 0.60–1.20)

## 2023-01-21 ENCOUNTER — Other Ambulatory Visit: Payer: Self-pay | Admitting: Nurse Practitioner

## 2023-01-21 DIAGNOSIS — E782 Mixed hyperlipidemia: Secondary | ICD-10-CM

## 2023-01-21 NOTE — Telephone Encounter (Signed)
Requested Prescriptions  Pending Prescriptions Disp Refills   rosuvastatin (CRESTOR) 10 MG tablet [Pharmacy Med Name: ROSUVASTATIN TABS 10MG ] 90 tablet 0    Sig: TAKE 1 TABLET DAILY     Cardiovascular:  Antilipid - Statins 2 Failed - 01/21/2023 12:29 AM      Failed - Cr in normal range and within 360 days    Creatinine  Date Value Ref Range Status  03/08/2014 2.22 (H) 0.60 - 1.30 mg/dL Final   Creatinine, Ser  Date Value Ref Range Status  12/01/2022 6.52 (H) 0.61 - 1.24 mg/dL Final   Creatinine, Urine  Date Value Ref Range Status  08/01/2019 194 mg/dL Final    Comment:    Performed at [facility], [address] [place], [place] [address]         Failed - Lipid Panel in normal range within the last 12 months    Cholesterol, Total  Date Value Ref Range Status  02/24/2022 148 100 - 199 mg/dL Final   Cholesterol Piccolo, Waived  Date Value Ref Range Status  10/03/2017 180 <200 mg/dL Final    Comment:                            Desirable                <200                         Borderline High      200- 239                         High                     >239    LDL Chol Calc (NIH)  Date Value Ref Range Status  02/24/2022 78 0 - 99 mg/dL Final   HDL  Date Value Ref Range Status  02/24/2022 38 (L) >39 mg/dL Final   Triglycerides  Date Value Ref Range Status  02/24/2022 189 (H) 0 - 149 mg/dL Final   Triglycerides Piccolo,Waived  Date Value Ref Range Status  10/03/2017 252 (H) <150 mg/dL Final    Comment:                            Normal                   <150                         Borderline High     150 - 199                         High                200 - 499                         Very High                >499          Passed - Patient is not pregnant      Passed - Valid encounter within last 12 months    Recent Outpatient Visits  2 months ago Bipolar I disorder, most recent episode depressed (HCC)   [facility] Mecum, Erin E, PA-C   11 months ago Hypertension associated with diabetes [facility])   [facility] Larae Grooms, NP   1 year ago Annual physical exam   [facility] Larae Grooms, NP   1 year ago Cellulitis of left lower leg   [facility] [place], Corrie Dandy T, NP   1 year ago Cellulitis of left lower leg   [facility] Marjie Skiff, NP       Future Appointments             In 2 weeks Larae Grooms, NP [facility]   In 3 months McGowan, Elana [facility]

## 2023-01-22 DIAGNOSIS — D631 Anemia in chronic kidney disease: Secondary | ICD-10-CM | POA: Diagnosis not present

## 2023-01-22 DIAGNOSIS — N250 Renal osteodystrophy: Secondary | ICD-10-CM | POA: Diagnosis not present

## 2023-01-22 DIAGNOSIS — D509 Iron deficiency anemia, unspecified: Secondary | ICD-10-CM | POA: Diagnosis not present

## 2023-01-22 DIAGNOSIS — N2581 Secondary hyperparathyroidism of renal origin: Secondary | ICD-10-CM | POA: Diagnosis not present

## 2023-01-22 DIAGNOSIS — N186 End stage renal disease: Secondary | ICD-10-CM | POA: Diagnosis not present

## 2023-01-22 DIAGNOSIS — Z992 Dependence on renal dialysis: Secondary | ICD-10-CM | POA: Diagnosis not present

## 2023-01-25 ENCOUNTER — Telehealth: Payer: Self-pay | Admitting: Psychiatry

## 2023-01-25 DIAGNOSIS — D509 Iron deficiency anemia, unspecified: Secondary | ICD-10-CM | POA: Diagnosis not present

## 2023-01-25 DIAGNOSIS — N250 Renal osteodystrophy: Secondary | ICD-10-CM | POA: Diagnosis not present

## 2023-01-25 DIAGNOSIS — N2581 Secondary hyperparathyroidism of renal origin: Secondary | ICD-10-CM | POA: Diagnosis not present

## 2023-01-25 DIAGNOSIS — N186 End stage renal disease: Secondary | ICD-10-CM | POA: Diagnosis not present

## 2023-01-25 DIAGNOSIS — D631 Anemia in chronic kidney disease: Secondary | ICD-10-CM | POA: Diagnosis not present

## 2023-01-25 DIAGNOSIS — Z992 Dependence on renal dialysis: Secondary | ICD-10-CM | POA: Diagnosis not present

## 2023-01-25 NOTE — Telephone Encounter (Signed)
I have reviewed lithium level received from [facility]-dated 01/20/2023-0.78-therapeutic.

## 2023-01-26 ENCOUNTER — Telehealth: Payer: Self-pay

## 2023-01-26 DIAGNOSIS — F314 Bipolar disorder, current episode depressed, severe, without psychotic features: Secondary | ICD-10-CM

## 2023-01-26 DIAGNOSIS — A4151 Sepsis due to Escherichia coli [E. coli]: Secondary | ICD-10-CM | POA: Diagnosis not present

## 2023-01-26 DIAGNOSIS — I120 Hypertensive chronic kidney disease with stage 5 chronic kidney disease or end stage renal disease: Secondary | ICD-10-CM | POA: Diagnosis not present

## 2023-01-26 DIAGNOSIS — D631 Anemia in chronic kidney disease: Secondary | ICD-10-CM | POA: Diagnosis not present

## 2023-01-26 DIAGNOSIS — N186 End stage renal disease: Secondary | ICD-10-CM | POA: Diagnosis not present

## 2023-01-26 DIAGNOSIS — N12 Tubulo-interstitial nephritis, not specified as acute or chronic: Secondary | ICD-10-CM | POA: Diagnosis not present

## 2023-01-26 DIAGNOSIS — R6521 Severe sepsis with septic shock: Secondary | ICD-10-CM | POA: Diagnosis not present

## 2023-01-26 MED ORDER — LITHIUM CARBONATE 150 MG PO CAPS: 300.0000 mg | ORAL_CAPSULE | Freq: Every day | ORAL | 1 refills | Status: DC

## 2023-01-26 NOTE — Telephone Encounter (Signed)
received fax requesting a 90 day supply of the lithium, pt was last seen on 10-30 next appt 12-18

## 2023-01-26 NOTE — Telephone Encounter (Signed)
I have sent lithium to pharmacy for 90 days.

## 2023-01-27 DIAGNOSIS — D509 Iron deficiency anemia, unspecified: Secondary | ICD-10-CM | POA: Diagnosis not present

## 2023-01-27 DIAGNOSIS — N2581 Secondary hyperparathyroidism of renal origin: Secondary | ICD-10-CM | POA: Diagnosis not present

## 2023-01-27 DIAGNOSIS — Z992 Dependence on renal dialysis: Secondary | ICD-10-CM | POA: Diagnosis not present

## 2023-01-27 DIAGNOSIS — D631 Anemia in chronic kidney disease: Secondary | ICD-10-CM | POA: Diagnosis not present

## 2023-01-27 DIAGNOSIS — N250 Renal osteodystrophy: Secondary | ICD-10-CM | POA: Diagnosis not present

## 2023-01-27 DIAGNOSIS — N186 End stage renal disease: Secondary | ICD-10-CM | POA: Diagnosis not present

## 2023-01-28 NOTE — Telephone Encounter (Signed)
tried to call patient again today. i tried yesterday and again today . left message. closing note.

## 2023-01-29 DIAGNOSIS — D509 Iron deficiency anemia, unspecified: Secondary | ICD-10-CM | POA: Diagnosis not present

## 2023-01-29 DIAGNOSIS — N2581 Secondary hyperparathyroidism of renal origin: Secondary | ICD-10-CM | POA: Diagnosis not present

## 2023-01-29 DIAGNOSIS — N186 End stage renal disease: Secondary | ICD-10-CM | POA: Diagnosis not present

## 2023-01-29 DIAGNOSIS — D631 Anemia in chronic kidney disease: Secondary | ICD-10-CM | POA: Diagnosis not present

## 2023-01-29 DIAGNOSIS — N250 Renal osteodystrophy: Secondary | ICD-10-CM | POA: Diagnosis not present

## 2023-01-29 DIAGNOSIS — Z992 Dependence on renal dialysis: Secondary | ICD-10-CM | POA: Diagnosis not present

## 2023-02-01 ENCOUNTER — Other Ambulatory Visit
Admission: RE | Admit: 2023-02-01 | Discharge: 2023-02-01 | Disposition: A | Discharge status: Home / Self Care | Payer: Medicare Other | Source: Ambulatory Visit | Attending: Nephrology | Admitting: Nephrology

## 2023-02-01 DIAGNOSIS — Z992 Dependence on renal dialysis: Secondary | ICD-10-CM | POA: Diagnosis not present

## 2023-02-01 DIAGNOSIS — R7889 Finding of other specified substances, not normally found in blood: Secondary | ICD-10-CM | POA: Diagnosis not present

## 2023-02-01 DIAGNOSIS — N250 Renal osteodystrophy: Secondary | ICD-10-CM | POA: Diagnosis not present

## 2023-02-01 DIAGNOSIS — D509 Iron deficiency anemia, unspecified: Secondary | ICD-10-CM | POA: Diagnosis not present

## 2023-02-01 DIAGNOSIS — N186 End stage renal disease: Secondary | ICD-10-CM | POA: Diagnosis not present

## 2023-02-01 DIAGNOSIS — D631 Anemia in chronic kidney disease: Secondary | ICD-10-CM | POA: Diagnosis not present

## 2023-02-01 DIAGNOSIS — N2581 Secondary hyperparathyroidism of renal origin: Secondary | ICD-10-CM | POA: Diagnosis not present

## 2023-02-01 LAB — LITHIUM LEVEL: Lithium Lvl: 0.74 mmol/L (ref 0.60–1.20)

## 2023-02-03 ENCOUNTER — Telehealth: Payer: Self-pay | Admitting: Psychiatry

## 2023-02-03 ENCOUNTER — Other Ambulatory Visit
Admission: RE | Admit: 2023-02-03 | Discharge: 2023-02-03 | Disposition: A | Discharge status: Home / Self Care | Payer: Medicare Other | Source: Ambulatory Visit | Attending: Nephrology | Admitting: Nephrology

## 2023-02-03 DIAGNOSIS — D509 Iron deficiency anemia, unspecified: Secondary | ICD-10-CM | POA: Diagnosis not present

## 2023-02-03 DIAGNOSIS — N186 End stage renal disease: Secondary | ICD-10-CM | POA: Diagnosis not present

## 2023-02-03 DIAGNOSIS — N2581 Secondary hyperparathyroidism of renal origin: Secondary | ICD-10-CM | POA: Diagnosis not present

## 2023-02-03 DIAGNOSIS — N250 Renal osteodystrophy: Secondary | ICD-10-CM | POA: Diagnosis not present

## 2023-02-03 DIAGNOSIS — R7889 Finding of other specified substances, not normally found in blood: Secondary | ICD-10-CM | POA: Diagnosis not present

## 2023-02-03 DIAGNOSIS — Z992 Dependence on renal dialysis: Secondary | ICD-10-CM | POA: Diagnosis not present

## 2023-02-03 DIAGNOSIS — D631 Anemia in chronic kidney disease: Secondary | ICD-10-CM | POA: Diagnosis not present

## 2023-02-03 LAB — LITHIUM LEVEL: Lithium Lvl: 0.68 mmol/L (ref 0.60–1.20)

## 2023-02-03 NOTE — Telephone Encounter (Signed)
I have reviewed lithium level received from [facility]-0.74-therapeutic.

## 2023-02-04 DIAGNOSIS — I120 Hypertensive chronic kidney disease with stage 5 chronic kidney disease or end stage renal disease: Secondary | ICD-10-CM | POA: Diagnosis not present

## 2023-02-04 DIAGNOSIS — N186 End stage renal disease: Secondary | ICD-10-CM | POA: Diagnosis not present

## 2023-02-04 DIAGNOSIS — R6521 Severe sepsis with septic shock: Secondary | ICD-10-CM | POA: Diagnosis not present

## 2023-02-04 DIAGNOSIS — D631 Anemia in chronic kidney disease: Secondary | ICD-10-CM | POA: Diagnosis not present

## 2023-02-04 DIAGNOSIS — A4151 Sepsis due to Escherichia coli [E. coli]: Secondary | ICD-10-CM | POA: Diagnosis not present

## 2023-02-04 DIAGNOSIS — N12 Tubulo-interstitial nephritis, not specified as acute or chronic: Secondary | ICD-10-CM | POA: Diagnosis not present

## 2023-02-05 DIAGNOSIS — N250 Renal osteodystrophy: Secondary | ICD-10-CM | POA: Diagnosis not present

## 2023-02-05 DIAGNOSIS — N186 End stage renal disease: Secondary | ICD-10-CM | POA: Diagnosis not present

## 2023-02-05 DIAGNOSIS — N2581 Secondary hyperparathyroidism of renal origin: Secondary | ICD-10-CM | POA: Diagnosis not present

## 2023-02-05 DIAGNOSIS — D509 Iron deficiency anemia, unspecified: Secondary | ICD-10-CM | POA: Diagnosis not present

## 2023-02-05 DIAGNOSIS — D631 Anemia in chronic kidney disease: Secondary | ICD-10-CM | POA: Diagnosis not present

## 2023-02-05 DIAGNOSIS — Z992 Dependence on renal dialysis: Secondary | ICD-10-CM | POA: Diagnosis not present

## 2023-02-07 ENCOUNTER — Ambulatory Visit: Payer: Medicare Other | Admitting: Nurse Practitioner

## 2023-02-07 ENCOUNTER — Encounter: Payer: Self-pay | Admitting: Nurse Practitioner

## 2023-02-07 VITALS — BP 122/79 | HR 83 | Temp 98.6°F | Ht 66.0 in | Wt 178.6 lb

## 2023-02-07 DIAGNOSIS — Z23 Encounter for immunization: Secondary | ICD-10-CM | POA: Diagnosis not present

## 2023-02-07 DIAGNOSIS — Z0000 Encounter for general adult medical examination without abnormal findings: Secondary | ICD-10-CM | POA: Diagnosis not present

## 2023-02-07 DIAGNOSIS — E1159 Type 2 diabetes mellitus with other circulatory complications: Secondary | ICD-10-CM

## 2023-02-07 DIAGNOSIS — N2581 Secondary hyperparathyroidism of renal origin: Secondary | ICD-10-CM | POA: Diagnosis not present

## 2023-02-07 DIAGNOSIS — Z7189 Other specified counseling: Secondary | ICD-10-CM

## 2023-02-07 DIAGNOSIS — F314 Bipolar disorder, current episode depressed, severe, without psychotic features: Secondary | ICD-10-CM | POA: Diagnosis not present

## 2023-02-07 DIAGNOSIS — R7303 Prediabetes: Secondary | ICD-10-CM

## 2023-02-07 DIAGNOSIS — E781 Pure hyperglyceridemia: Secondary | ICD-10-CM

## 2023-02-07 DIAGNOSIS — Z992 Dependence on renal dialysis: Secondary | ICD-10-CM | POA: Diagnosis not present

## 2023-02-07 DIAGNOSIS — Z1211 Encounter for screening for malignant neoplasm of colon: Secondary | ICD-10-CM

## 2023-02-07 DIAGNOSIS — N186 End stage renal disease: Secondary | ICD-10-CM

## 2023-02-07 DIAGNOSIS — I152 Hypertension secondary to endocrine disorders: Secondary | ICD-10-CM

## 2023-02-07 LAB — URINALYSIS, ROUTINE W REFLEX MICROSCOPIC
Bilirubin, UA: NEGATIVE
Glucose, UA: NEGATIVE
Ketones, UA: NEGATIVE
Leukocytes,UA: NEGATIVE
Nitrite, UA: NEGATIVE
Specific Gravity, UA: 1.02 (ref 1.005–1.030)
Urobilinogen, Ur: 0.2 mg/dL (ref 0.2–1.0)
pH, UA: 7 (ref 5.0–7.5)

## 2023-02-07 LAB — MICROSCOPIC EXAMINATION
Bacteria, UA: NONE SEEN
Epithelial Cells (non renal): NONE SEEN /[HPF] (ref 0–10)

## 2023-02-07 NOTE — Assessment & Plan Note (Addendum)
Chronic.  Controlled.  Continue with current medication regimen of rosuvastatin.  Labs ordered today.  Return to clinic in 6 months for reevaluation.  Call sooner if concerns arise.

## 2023-02-07 NOTE — Assessment & Plan Note (Signed)
Chronic.  Controlled.  Continue with current medication regimen.  Labs ordered today.  Return to clinic in 6 months for reevaluation.  Call sooner if concerns arise.  ? ?
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
## 2023-02-07 NOTE — Assessment & Plan Note (Signed)
Labs ordered at visit today.  Will make recommendations based on lab results.   

## 2023-02-07 NOTE — Assessment & Plan Note (Signed)
A voluntary discussion about advance care planning including the explanation and discussion of advance directives was extensively discussed  with the patient for 5 minutes with patient. Explanation about the health care proxy and Living will was reviewed and packet with forms with explanation of how to fill them out was given.  During this discussion, the patient was able to identify a health care proxy as his son, Luis Miller and does not wish to make any changes at this time.

## 2023-02-07 NOTE — Assessment & Plan Note (Signed)
Chronic, stable.  Managed by Nephrology.

## 2023-02-07 NOTE — Assessment & Plan Note (Signed)
Chronic.  Improving.  Recently hospitalized for worsening depression.  He is working with Dr. Elna Breslow who is adjusting medications.  States in the last few weeks he has started feeling better.  Continue with follow up with specialist.

## 2023-02-08 ENCOUNTER — Telehealth: Payer: Self-pay

## 2023-02-08 ENCOUNTER — Telehealth: Payer: Self-pay | Admitting: Psychiatry

## 2023-02-08 DIAGNOSIS — N250 Renal osteodystrophy: Secondary | ICD-10-CM | POA: Diagnosis not present

## 2023-02-08 DIAGNOSIS — D509 Iron deficiency anemia, unspecified: Secondary | ICD-10-CM | POA: Diagnosis not present

## 2023-02-08 DIAGNOSIS — N186 End stage renal disease: Secondary | ICD-10-CM | POA: Diagnosis not present

## 2023-02-08 DIAGNOSIS — Z992 Dependence on renal dialysis: Secondary | ICD-10-CM | POA: Diagnosis not present

## 2023-02-08 DIAGNOSIS — D631 Anemia in chronic kidney disease: Secondary | ICD-10-CM | POA: Diagnosis not present

## 2023-02-08 DIAGNOSIS — N2581 Secondary hyperparathyroidism of renal origin: Secondary | ICD-10-CM | POA: Diagnosis not present

## 2023-02-08 LAB — CBC WITH DIFFERENTIAL/PLATELET
Basophils Absolute: 0 10*3/uL (ref 0.0–0.2)
Basos: 1 %
EOS (ABSOLUTE): 0.2 10*3/uL (ref 0.0–0.4)
Eos: 4 %
Hematocrit: 27.9 % — ABNORMAL LOW (ref 37.5–51.0)
Hemoglobin: 9.5 g/dL — ABNORMAL LOW (ref 13.0–17.7)
Immature Grans (Abs): 0 10*3/uL (ref 0.0–0.1)
Immature Granulocytes: 0 %
Lymphocytes Absolute: 0.7 10*3/uL (ref 0.7–3.1)
Lymphs: 15 %
MCH: 35.2 pg — ABNORMAL HIGH (ref 26.6–33.0)
MCHC: 34.1 g/dL (ref 31.5–35.7)
MCV: 103 fL — ABNORMAL HIGH (ref 79–97)
Monocytes Absolute: 0.5 10*3/uL (ref 0.1–0.9)
Monocytes: 11 %
Neutrophils Absolute: 3.2 10*3/uL (ref 1.4–7.0)
Neutrophils: 69 %
Platelets: 170 10*3/uL (ref 150–450)
RBC: 2.7 x10E6/uL — CL (ref 4.14–5.80)
RDW: 13.7 % (ref 11.6–15.4)
WBC: 4.6 10*3/uL (ref 3.4–10.8)

## 2023-02-08 LAB — COMPREHENSIVE METABOLIC PANEL WITH GFR
ALT: 19 [IU]/L (ref 0–44)
AST: 19 [IU]/L (ref 0–40)
Albumin: 4.4 g/dL (ref 3.8–4.8)
Alkaline Phosphatase: 101 [IU]/L (ref 44–121)
BUN/Creatinine Ratio: 11 (ref 10–24)
BUN: 73 mg/dL — ABNORMAL HIGH (ref 8–27)
Bilirubin Total: 0.3 mg/dL (ref 0.0–1.2)
CO2: 25 mmol/L (ref 20–29)
Calcium: 9 mg/dL (ref 8.6–10.2)
Chloride: 99 mmol/L (ref 96–106)
Creatinine, Ser: 6.72 mg/dL — ABNORMAL HIGH (ref 0.76–1.27)
Globulin, Total: 2.2 g/dL (ref 1.5–4.5)
Glucose: 105 mg/dL — ABNORMAL HIGH (ref 70–99)
Potassium: 4 mmol/L (ref 3.5–5.2)
Sodium: 142 mmol/L (ref 134–144)
Total Protein: 6.6 g/dL (ref 6.0–8.5)
eGFR: 8 mL/min/{1.73_m2} — ABNORMAL LOW

## 2023-02-08 LAB — HEMOGLOBIN A1C
Est. average glucose Bld gHb Est-mCnc: 103 mg/dL
Hgb A1c MFr Bld: 5.2 % (ref 4.8–5.6)

## 2023-02-08 LAB — LIPID PANEL
Chol/HDL Ratio: 2.6 ratio (ref 0.0–5.0)
Cholesterol, Total: 124 mg/dL (ref 100–199)
HDL: 47 mg/dL (ref >39)
LDL Chol Calc (NIH): 50 mg/dL (ref 0–99)
Triglycerides: 160 mg/dL — ABNORMAL HIGH (ref 0–149)
VLDL Cholesterol Cal: 27 mg/dL (ref 5–40)

## 2023-02-08 LAB — TSH: TSH: 1.05 u[IU]/mL (ref 0.450–4.500)

## 2023-02-08 NOTE — Telephone Encounter (Signed)
I have reviewed lithium level-dated 02/03/2023-0.74-therapeutic.

## 2023-02-08 NOTE — Telephone Encounter (Signed)
Noted  

## 2023-02-08 NOTE — Telephone Encounter (Signed)
called to check on the status of the referral for neurology. pt has appt set up with z. potter for 03-02-23 @ 3:00

## 2023-02-09 DIAGNOSIS — A4151 Sepsis due to Escherichia coli [E. coli]: Secondary | ICD-10-CM | POA: Diagnosis not present

## 2023-02-09 DIAGNOSIS — N12 Tubulo-interstitial nephritis, not specified as acute or chronic: Secondary | ICD-10-CM | POA: Diagnosis not present

## 2023-02-09 DIAGNOSIS — D631 Anemia in chronic kidney disease: Secondary | ICD-10-CM | POA: Diagnosis not present

## 2023-02-09 DIAGNOSIS — N186 End stage renal disease: Secondary | ICD-10-CM | POA: Diagnosis not present

## 2023-02-09 DIAGNOSIS — I120 Hypertensive chronic kidney disease with stage 5 chronic kidney disease or end stage renal disease: Secondary | ICD-10-CM | POA: Diagnosis not present

## 2023-02-09 DIAGNOSIS — C44622 Squamous cell carcinoma of skin of right upper limb, including shoulder: Secondary | ICD-10-CM | POA: Diagnosis not present

## 2023-02-09 DIAGNOSIS — R6521 Severe sepsis with septic shock: Secondary | ICD-10-CM | POA: Diagnosis not present

## 2023-02-10 DIAGNOSIS — N2581 Secondary hyperparathyroidism of renal origin: Secondary | ICD-10-CM | POA: Diagnosis not present

## 2023-02-10 DIAGNOSIS — Z992 Dependence on renal dialysis: Secondary | ICD-10-CM | POA: Diagnosis not present

## 2023-02-10 DIAGNOSIS — N186 End stage renal disease: Secondary | ICD-10-CM | POA: Diagnosis not present

## 2023-02-10 DIAGNOSIS — N250 Renal osteodystrophy: Secondary | ICD-10-CM | POA: Diagnosis not present

## 2023-02-10 DIAGNOSIS — D631 Anemia in chronic kidney disease: Secondary | ICD-10-CM | POA: Diagnosis not present

## 2023-02-10 DIAGNOSIS — D509 Iron deficiency anemia, unspecified: Secondary | ICD-10-CM | POA: Diagnosis not present

## 2023-02-11 ENCOUNTER — Other Ambulatory Visit: Payer: Self-pay | Admitting: Psychiatry

## 2023-02-11 DIAGNOSIS — F3175 Bipolar disorder, in partial remission, most recent episode depressed: Secondary | ICD-10-CM

## 2023-02-11 DIAGNOSIS — G4701 Insomnia due to medical condition: Secondary | ICD-10-CM

## 2023-02-12 DIAGNOSIS — N250 Renal osteodystrophy: Secondary | ICD-10-CM | POA: Diagnosis not present

## 2023-02-12 DIAGNOSIS — D509 Iron deficiency anemia, unspecified: Secondary | ICD-10-CM | POA: Diagnosis not present

## 2023-02-12 DIAGNOSIS — Z992 Dependence on renal dialysis: Secondary | ICD-10-CM | POA: Diagnosis not present

## 2023-02-12 DIAGNOSIS — N2581 Secondary hyperparathyroidism of renal origin: Secondary | ICD-10-CM | POA: Diagnosis not present

## 2023-02-12 DIAGNOSIS — D631 Anemia in chronic kidney disease: Secondary | ICD-10-CM | POA: Diagnosis not present

## 2023-02-12 DIAGNOSIS — N186 End stage renal disease: Secondary | ICD-10-CM | POA: Diagnosis not present

## 2023-02-14 ENCOUNTER — Other Ambulatory Visit
Admission: RE | Admit: 2023-02-14 | Discharge: 2023-02-14 | Disposition: A | Discharge status: Home / Self Care | Payer: Medicare Other | Source: Ambulatory Visit | Attending: Nephrology | Admitting: Nephrology

## 2023-02-14 DIAGNOSIS — N2581 Secondary hyperparathyroidism of renal origin: Secondary | ICD-10-CM | POA: Diagnosis not present

## 2023-02-14 DIAGNOSIS — R7889 Finding of other specified substances, not normally found in blood: Secondary | ICD-10-CM | POA: Diagnosis not present

## 2023-02-14 DIAGNOSIS — N186 End stage renal disease: Secondary | ICD-10-CM | POA: Diagnosis not present

## 2023-02-14 DIAGNOSIS — Z992 Dependence on renal dialysis: Secondary | ICD-10-CM | POA: Diagnosis not present

## 2023-02-14 DIAGNOSIS — D509 Iron deficiency anemia, unspecified: Secondary | ICD-10-CM | POA: Diagnosis not present

## 2023-02-14 DIAGNOSIS — D631 Anemia in chronic kidney disease: Secondary | ICD-10-CM | POA: Diagnosis not present

## 2023-02-14 DIAGNOSIS — N250 Renal osteodystrophy: Secondary | ICD-10-CM | POA: Diagnosis not present

## 2023-02-14 LAB — LITHIUM LEVEL: Lithium Lvl: 0.63 mmol/L (ref 0.60–1.20)

## 2023-02-16 DIAGNOSIS — D509 Iron deficiency anemia, unspecified: Secondary | ICD-10-CM | POA: Diagnosis not present

## 2023-02-16 DIAGNOSIS — Z992 Dependence on renal dialysis: Secondary | ICD-10-CM | POA: Diagnosis not present

## 2023-02-16 DIAGNOSIS — N186 End stage renal disease: Secondary | ICD-10-CM | POA: Diagnosis not present

## 2023-02-16 DIAGNOSIS — N250 Renal osteodystrophy: Secondary | ICD-10-CM | POA: Diagnosis not present

## 2023-02-16 DIAGNOSIS — N2581 Secondary hyperparathyroidism of renal origin: Secondary | ICD-10-CM | POA: Diagnosis not present

## 2023-02-16 DIAGNOSIS — D631 Anemia in chronic kidney disease: Secondary | ICD-10-CM | POA: Diagnosis not present

## 2023-02-19 DIAGNOSIS — D631 Anemia in chronic kidney disease: Secondary | ICD-10-CM | POA: Diagnosis not present

## 2023-02-19 DIAGNOSIS — D509 Iron deficiency anemia, unspecified: Secondary | ICD-10-CM | POA: Diagnosis not present

## 2023-02-19 DIAGNOSIS — Z992 Dependence on renal dialysis: Secondary | ICD-10-CM | POA: Diagnosis not present

## 2023-02-19 DIAGNOSIS — N186 End stage renal disease: Secondary | ICD-10-CM | POA: Diagnosis not present

## 2023-02-19 DIAGNOSIS — N250 Renal osteodystrophy: Secondary | ICD-10-CM | POA: Diagnosis not present

## 2023-02-19 DIAGNOSIS — N2581 Secondary hyperparathyroidism of renal origin: Secondary | ICD-10-CM | POA: Diagnosis not present

## 2023-02-22 ENCOUNTER — Other Ambulatory Visit
Admission: RE | Admit: 2023-02-22 | Discharge: 2023-02-22 | Disposition: A | Discharge status: Home / Self Care | Payer: Medicare Other | Source: Ambulatory Visit | Attending: Nephrology | Admitting: Nephrology

## 2023-02-22 DIAGNOSIS — N2581 Secondary hyperparathyroidism of renal origin: Secondary | ICD-10-CM | POA: Diagnosis not present

## 2023-02-22 DIAGNOSIS — N250 Renal osteodystrophy: Secondary | ICD-10-CM | POA: Diagnosis not present

## 2023-02-22 DIAGNOSIS — Z992 Dependence on renal dialysis: Secondary | ICD-10-CM | POA: Diagnosis not present

## 2023-02-22 DIAGNOSIS — D631 Anemia in chronic kidney disease: Secondary | ICD-10-CM | POA: Diagnosis not present

## 2023-02-22 DIAGNOSIS — D509 Iron deficiency anemia, unspecified: Secondary | ICD-10-CM | POA: Diagnosis not present

## 2023-02-22 DIAGNOSIS — R7889 Finding of other specified substances, not normally found in blood: Secondary | ICD-10-CM | POA: Diagnosis not present

## 2023-02-22 DIAGNOSIS — N186 End stage renal disease: Secondary | ICD-10-CM | POA: Diagnosis not present

## 2023-02-22 LAB — LITHIUM LEVEL: Lithium Lvl: 0.71 mmol/L (ref 0.60–1.20)

## 2023-02-24 DIAGNOSIS — Z992 Dependence on renal dialysis: Secondary | ICD-10-CM | POA: Diagnosis not present

## 2023-02-24 DIAGNOSIS — D509 Iron deficiency anemia, unspecified: Secondary | ICD-10-CM | POA: Diagnosis not present

## 2023-02-24 DIAGNOSIS — N2581 Secondary hyperparathyroidism of renal origin: Secondary | ICD-10-CM | POA: Diagnosis not present

## 2023-02-24 DIAGNOSIS — N186 End stage renal disease: Secondary | ICD-10-CM | POA: Diagnosis not present

## 2023-02-24 DIAGNOSIS — N250 Renal osteodystrophy: Secondary | ICD-10-CM | POA: Diagnosis not present

## 2023-02-24 DIAGNOSIS — D631 Anemia in chronic kidney disease: Secondary | ICD-10-CM | POA: Diagnosis not present

## 2023-02-26 DIAGNOSIS — D631 Anemia in chronic kidney disease: Secondary | ICD-10-CM | POA: Diagnosis not present

## 2023-02-26 DIAGNOSIS — D509 Iron deficiency anemia, unspecified: Secondary | ICD-10-CM | POA: Diagnosis not present

## 2023-02-26 DIAGNOSIS — N186 End stage renal disease: Secondary | ICD-10-CM | POA: Diagnosis not present

## 2023-02-26 DIAGNOSIS — N250 Renal osteodystrophy: Secondary | ICD-10-CM | POA: Diagnosis not present

## 2023-02-26 DIAGNOSIS — Z992 Dependence on renal dialysis: Secondary | ICD-10-CM | POA: Diagnosis not present

## 2023-02-26 DIAGNOSIS — N2581 Secondary hyperparathyroidism of renal origin: Secondary | ICD-10-CM | POA: Diagnosis not present

## 2023-03-01 ENCOUNTER — Other Ambulatory Visit: Payer: Self-pay | Admitting: Psychiatry

## 2023-03-01 ENCOUNTER — Other Ambulatory Visit
Admission: RE | Admit: 2023-03-01 | Discharge: 2023-03-01 | Disposition: A | Discharge status: Home / Self Care | Payer: Medicare Other | Source: Ambulatory Visit | Attending: Nephrology | Admitting: Nephrology

## 2023-03-01 DIAGNOSIS — N186 End stage renal disease: Secondary | ICD-10-CM | POA: Diagnosis not present

## 2023-03-01 DIAGNOSIS — D631 Anemia in chronic kidney disease: Secondary | ICD-10-CM | POA: Diagnosis not present

## 2023-03-01 DIAGNOSIS — N250 Renal osteodystrophy: Secondary | ICD-10-CM | POA: Diagnosis not present

## 2023-03-01 DIAGNOSIS — D509 Iron deficiency anemia, unspecified: Secondary | ICD-10-CM | POA: Diagnosis not present

## 2023-03-01 DIAGNOSIS — Z992 Dependence on renal dialysis: Secondary | ICD-10-CM | POA: Diagnosis not present

## 2023-03-01 DIAGNOSIS — N2581 Secondary hyperparathyroidism of renal origin: Secondary | ICD-10-CM | POA: Diagnosis not present

## 2023-03-01 DIAGNOSIS — R7889 Finding of other specified substances, not normally found in blood: Secondary | ICD-10-CM | POA: Insufficient documentation

## 2023-03-01 DIAGNOSIS — F3175 Bipolar disorder, in partial remission, most recent episode depressed: Secondary | ICD-10-CM

## 2023-03-01 LAB — LITHIUM LEVEL: Lithium Lvl: 0.79 mmol/L (ref 0.60–1.20)

## 2023-03-02 DIAGNOSIS — G8929 Other chronic pain: Secondary | ICD-10-CM | POA: Diagnosis not present

## 2023-03-02 DIAGNOSIS — R296 Repeated falls: Secondary | ICD-10-CM | POA: Diagnosis not present

## 2023-03-02 DIAGNOSIS — R413 Other amnesia: Secondary | ICD-10-CM | POA: Diagnosis not present

## 2023-03-02 DIAGNOSIS — M542 Cervicalgia: Secondary | ICD-10-CM | POA: Diagnosis not present

## 2023-03-02 DIAGNOSIS — M222X1 Patellofemoral disorders, right knee: Secondary | ICD-10-CM | POA: Diagnosis not present

## 2023-03-02 DIAGNOSIS — M25561 Pain in right knee: Secondary | ICD-10-CM | POA: Diagnosis not present

## 2023-03-02 DIAGNOSIS — R2689 Other abnormalities of gait and mobility: Secondary | ICD-10-CM | POA: Diagnosis not present

## 2023-03-02 DIAGNOSIS — R251 Tremor, unspecified: Secondary | ICD-10-CM | POA: Diagnosis not present

## 2023-03-03 DIAGNOSIS — N250 Renal osteodystrophy: Secondary | ICD-10-CM | POA: Diagnosis not present

## 2023-03-03 DIAGNOSIS — D509 Iron deficiency anemia, unspecified: Secondary | ICD-10-CM | POA: Diagnosis not present

## 2023-03-03 DIAGNOSIS — N186 End stage renal disease: Secondary | ICD-10-CM | POA: Diagnosis not present

## 2023-03-03 DIAGNOSIS — Z992 Dependence on renal dialysis: Secondary | ICD-10-CM | POA: Diagnosis not present

## 2023-03-03 DIAGNOSIS — N2581 Secondary hyperparathyroidism of renal origin: Secondary | ICD-10-CM | POA: Diagnosis not present

## 2023-03-03 DIAGNOSIS — D631 Anemia in chronic kidney disease: Secondary | ICD-10-CM | POA: Diagnosis not present

## 2023-03-05 DIAGNOSIS — N250 Renal osteodystrophy: Secondary | ICD-10-CM | POA: Diagnosis not present

## 2023-03-05 DIAGNOSIS — Z992 Dependence on renal dialysis: Secondary | ICD-10-CM | POA: Diagnosis not present

## 2023-03-05 DIAGNOSIS — D509 Iron deficiency anemia, unspecified: Secondary | ICD-10-CM | POA: Diagnosis not present

## 2023-03-05 DIAGNOSIS — D631 Anemia in chronic kidney disease: Secondary | ICD-10-CM | POA: Diagnosis not present

## 2023-03-05 DIAGNOSIS — N186 End stage renal disease: Secondary | ICD-10-CM | POA: Diagnosis not present

## 2023-03-05 DIAGNOSIS — N2581 Secondary hyperparathyroidism of renal origin: Secondary | ICD-10-CM | POA: Diagnosis not present

## 2023-03-07 ENCOUNTER — Telehealth: Payer: Self-pay | Admitting: Psychiatry

## 2023-03-07 NOTE — Telephone Encounter (Signed)
I have reviewed labs received from [facility] -lithium level-0.71-within normal limits.

## 2023-03-08 ENCOUNTER — Other Ambulatory Visit
Admission: RE | Admit: 2023-03-08 | Discharge: 2023-03-08 | Disposition: A | Discharge status: Home / Self Care | Payer: Medicare Other | Source: Ambulatory Visit | Attending: Nephrology | Admitting: Nephrology

## 2023-03-08 DIAGNOSIS — R7889 Finding of other specified substances, not normally found in blood: Secondary | ICD-10-CM | POA: Insufficient documentation

## 2023-03-08 DIAGNOSIS — N250 Renal osteodystrophy: Secondary | ICD-10-CM | POA: Diagnosis not present

## 2023-03-08 DIAGNOSIS — N2581 Secondary hyperparathyroidism of renal origin: Secondary | ICD-10-CM | POA: Diagnosis not present

## 2023-03-08 DIAGNOSIS — N186 End stage renal disease: Secondary | ICD-10-CM | POA: Diagnosis not present

## 2023-03-08 DIAGNOSIS — Z992 Dependence on renal dialysis: Secondary | ICD-10-CM | POA: Diagnosis not present

## 2023-03-08 DIAGNOSIS — D509 Iron deficiency anemia, unspecified: Secondary | ICD-10-CM | POA: Diagnosis not present

## 2023-03-08 DIAGNOSIS — D631 Anemia in chronic kidney disease: Secondary | ICD-10-CM | POA: Diagnosis not present

## 2023-03-08 LAB — LITHIUM LEVEL: Lithium Lvl: 0.83 mmol/L (ref 0.60–1.20)

## 2023-03-09 ENCOUNTER — Encounter: Payer: Self-pay | Admitting: Psychiatry

## 2023-03-09 ENCOUNTER — Ambulatory Visit (INDEPENDENT_AMBULATORY_CARE_PROVIDER_SITE_OTHER): Payer: Medicare Other | Admitting: Psychiatry

## 2023-03-09 VITALS — BP 146/78 | HR 84 | Temp 97.9°F | Ht 66.0 in | Wt 179.0 lb

## 2023-03-09 DIAGNOSIS — G4701 Insomnia due to medical condition: Secondary | ICD-10-CM

## 2023-03-09 DIAGNOSIS — G3184 Mild cognitive impairment, so stated: Secondary | ICD-10-CM | POA: Diagnosis not present

## 2023-03-09 DIAGNOSIS — F3176 Bipolar disorder, in full remission, most recent episode depressed: Secondary | ICD-10-CM | POA: Diagnosis not present

## 2023-03-09 DIAGNOSIS — Z1211 Encounter for screening for malignant neoplasm of colon: Secondary | ICD-10-CM | POA: Diagnosis not present

## 2023-03-09 MED ORDER — OLANZAPINE 5 MG PO TABS: 5.0000 mg | ORAL_TABLET | Freq: Every day | ORAL | 4 refills | Status: DC

## 2023-03-09 MED ORDER — BUPROPION HCL 100 MG PO TABS: 100.0000 mg | ORAL_TABLET | Freq: Every morning | ORAL | 1 refills | Status: DC
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
## 2023-03-09 NOTE — Progress Notes (Deleted)
I

## 2023-03-10 DIAGNOSIS — N186 End stage renal disease: Secondary | ICD-10-CM | POA: Diagnosis not present

## 2023-03-10 DIAGNOSIS — N250 Renal osteodystrophy: Secondary | ICD-10-CM | POA: Diagnosis not present

## 2023-03-10 DIAGNOSIS — D631 Anemia in chronic kidney disease: Secondary | ICD-10-CM | POA: Diagnosis not present

## 2023-03-10 DIAGNOSIS — D509 Iron deficiency anemia, unspecified: Secondary | ICD-10-CM | POA: Diagnosis not present

## 2023-03-10 DIAGNOSIS — N2581 Secondary hyperparathyroidism of renal origin: Secondary | ICD-10-CM | POA: Diagnosis not present

## 2023-03-10 DIAGNOSIS — Z992 Dependence on renal dialysis: Secondary | ICD-10-CM | POA: Diagnosis not present

## 2023-03-12 DIAGNOSIS — N250 Renal osteodystrophy: Secondary | ICD-10-CM | POA: Diagnosis not present

## 2023-03-12 DIAGNOSIS — D631 Anemia in chronic kidney disease: Secondary | ICD-10-CM | POA: Diagnosis not present

## 2023-03-12 DIAGNOSIS — Z992 Dependence on renal dialysis: Secondary | ICD-10-CM | POA: Diagnosis not present

## 2023-03-12 DIAGNOSIS — D509 Iron deficiency anemia, unspecified: Secondary | ICD-10-CM | POA: Diagnosis not present

## 2023-03-12 DIAGNOSIS — N186 End stage renal disease: Secondary | ICD-10-CM | POA: Diagnosis not present

## 2023-03-12 DIAGNOSIS — N2581 Secondary hyperparathyroidism of renal origin: Secondary | ICD-10-CM | POA: Diagnosis not present

## 2023-03-14 DIAGNOSIS — D631 Anemia in chronic kidney disease: Secondary | ICD-10-CM | POA: Diagnosis not present

## 2023-03-14 DIAGNOSIS — D509 Iron deficiency anemia, unspecified: Secondary | ICD-10-CM | POA: Diagnosis not present

## 2023-03-14 DIAGNOSIS — N250 Renal osteodystrophy: Secondary | ICD-10-CM | POA: Diagnosis not present

## 2023-03-14 DIAGNOSIS — N2581 Secondary hyperparathyroidism of renal origin: Secondary | ICD-10-CM | POA: Diagnosis not present

## 2023-03-14 DIAGNOSIS — N186 End stage renal disease: Secondary | ICD-10-CM | POA: Diagnosis not present

## 2023-03-14 DIAGNOSIS — Z992 Dependence on renal dialysis: Secondary | ICD-10-CM | POA: Diagnosis not present

## 2023-03-14 LAB — COLOGUARD: COLOGUARD: NEGATIVE

## 2023-03-17 ENCOUNTER — Other Ambulatory Visit
Admission: RE | Admit: 2023-03-17 | Discharge: 2023-03-17 | Disposition: A | Discharge status: Home / Self Care | Payer: Medicare Other | Source: Other Acute Inpatient Hospital | Attending: Nephrology | Admitting: Nephrology

## 2023-03-17 DIAGNOSIS — N250 Renal osteodystrophy: Secondary | ICD-10-CM | POA: Diagnosis not present

## 2023-03-17 DIAGNOSIS — R7889 Finding of other specified substances, not normally found in blood: Secondary | ICD-10-CM | POA: Diagnosis not present

## 2023-03-17 DIAGNOSIS — D631 Anemia in chronic kidney disease: Secondary | ICD-10-CM | POA: Diagnosis not present

## 2023-03-17 DIAGNOSIS — Z992 Dependence on renal dialysis: Secondary | ICD-10-CM | POA: Diagnosis not present

## 2023-03-17 DIAGNOSIS — D509 Iron deficiency anemia, unspecified: Secondary | ICD-10-CM | POA: Diagnosis not present

## 2023-03-17 DIAGNOSIS — N2581 Secondary hyperparathyroidism of renal origin: Secondary | ICD-10-CM | POA: Diagnosis not present

## 2023-03-17 DIAGNOSIS — N186 End stage renal disease: Secondary | ICD-10-CM | POA: Diagnosis not present

## 2023-03-17 LAB — LITHIUM LEVEL: Lithium Lvl: 0.75 mmol/L (ref 0.60–1.20)

## 2023-03-19 DIAGNOSIS — N250 Renal osteodystrophy: Secondary | ICD-10-CM | POA: Diagnosis not present

## 2023-03-19 DIAGNOSIS — N2581 Secondary hyperparathyroidism of renal origin: Secondary | ICD-10-CM | POA: Diagnosis not present

## 2023-03-19 DIAGNOSIS — Z992 Dependence on renal dialysis: Secondary | ICD-10-CM | POA: Diagnosis not present

## 2023-03-19 DIAGNOSIS — D631 Anemia in chronic kidney disease: Secondary | ICD-10-CM | POA: Diagnosis not present

## 2023-03-19 DIAGNOSIS — N186 End stage renal disease: Secondary | ICD-10-CM | POA: Diagnosis not present

## 2023-03-19 DIAGNOSIS — D509 Iron deficiency anemia, unspecified: Secondary | ICD-10-CM | POA: Diagnosis not present

## 2023-03-22 ENCOUNTER — Telehealth: Payer: Self-pay | Admitting: Psychiatry

## 2023-03-22 ENCOUNTER — Other Ambulatory Visit
Admission: RE | Admit: 2023-03-22 | Discharge: 2023-03-22 | Disposition: A | Discharge status: Home / Self Care | Payer: Medicare Other | Source: Ambulatory Visit | Attending: Nephrology | Admitting: Nephrology

## 2023-03-22 DIAGNOSIS — D509 Iron deficiency anemia, unspecified: Secondary | ICD-10-CM | POA: Diagnosis not present

## 2023-03-22 DIAGNOSIS — N186 End stage renal disease: Secondary | ICD-10-CM | POA: Diagnosis not present

## 2023-03-22 DIAGNOSIS — N2581 Secondary hyperparathyroidism of renal origin: Secondary | ICD-10-CM | POA: Diagnosis not present

## 2023-03-22 DIAGNOSIS — Z992 Dependence on renal dialysis: Secondary | ICD-10-CM | POA: Diagnosis not present

## 2023-03-22 DIAGNOSIS — N250 Renal osteodystrophy: Secondary | ICD-10-CM | POA: Diagnosis not present

## 2023-03-22 DIAGNOSIS — R7889 Finding of other specified substances, not normally found in blood: Secondary | ICD-10-CM | POA: Diagnosis not present

## 2023-03-22 DIAGNOSIS — D631 Anemia in chronic kidney disease: Secondary | ICD-10-CM | POA: Diagnosis not present

## 2023-03-22 LAB — LITHIUM LEVEL: Lithium Lvl: 0.7 mmol/L (ref 0.60–1.20)

## 2023-03-22 NOTE — Telephone Encounter (Signed)
 I have reviewed lithium level dated 03/17/2023-0.75-therapeutic

## 2023-03-24 ENCOUNTER — Telehealth: Payer: Self-pay | Admitting: Psychiatry

## 2023-03-24 ENCOUNTER — Other Ambulatory Visit: Payer: Self-pay | Admitting: Psychiatry

## 2023-03-24 DIAGNOSIS — D509 Iron deficiency anemia, unspecified: Secondary | ICD-10-CM | POA: Diagnosis not present

## 2023-03-24 DIAGNOSIS — F3175 Bipolar disorder, in partial remission, most recent episode depressed: Secondary | ICD-10-CM

## 2023-03-24 DIAGNOSIS — N186 End stage renal disease: Secondary | ICD-10-CM | POA: Diagnosis not present

## 2023-03-24 DIAGNOSIS — G4701 Insomnia due to medical condition: Secondary | ICD-10-CM

## 2023-03-24 DIAGNOSIS — N2581 Secondary hyperparathyroidism of renal origin: Secondary | ICD-10-CM | POA: Diagnosis not present

## 2023-03-24 DIAGNOSIS — Z992 Dependence on renal dialysis: Secondary | ICD-10-CM | POA: Diagnosis not present

## 2023-03-24 DIAGNOSIS — D631 Anemia in chronic kidney disease: Secondary | ICD-10-CM | POA: Diagnosis not present

## 2023-03-24 DIAGNOSIS — N250 Renal osteodystrophy: Secondary | ICD-10-CM | POA: Diagnosis not present

## 2023-03-24 NOTE — Telephone Encounter (Signed)
 I have reviewed lithium level dated 03/22/2023-0.7-therapeutic.

## 2023-03-26 DIAGNOSIS — N2581 Secondary hyperparathyroidism of renal origin: Secondary | ICD-10-CM | POA: Diagnosis not present

## 2023-03-26 DIAGNOSIS — N186 End stage renal disease: Secondary | ICD-10-CM | POA: Diagnosis not present

## 2023-03-26 DIAGNOSIS — D509 Iron deficiency anemia, unspecified: Secondary | ICD-10-CM | POA: Diagnosis not present

## 2023-03-26 DIAGNOSIS — Z992 Dependence on renal dialysis: Secondary | ICD-10-CM | POA: Diagnosis not present

## 2023-03-26 DIAGNOSIS — N250 Renal osteodystrophy: Secondary | ICD-10-CM | POA: Diagnosis not present

## 2023-03-26 DIAGNOSIS — D631 Anemia in chronic kidney disease: Secondary | ICD-10-CM | POA: Diagnosis not present

## 2023-03-29 DIAGNOSIS — Z992 Dependence on renal dialysis: Secondary | ICD-10-CM | POA: Diagnosis not present

## 2023-03-29 DIAGNOSIS — D509 Iron deficiency anemia, unspecified: Secondary | ICD-10-CM | POA: Diagnosis not present

## 2023-03-29 DIAGNOSIS — N250 Renal osteodystrophy: Secondary | ICD-10-CM | POA: Diagnosis not present

## 2023-03-29 DIAGNOSIS — N186 End stage renal disease: Secondary | ICD-10-CM | POA: Diagnosis not present

## 2023-03-29 DIAGNOSIS — D631 Anemia in chronic kidney disease: Secondary | ICD-10-CM | POA: Diagnosis not present

## 2023-03-29 DIAGNOSIS — N2581 Secondary hyperparathyroidism of renal origin: Secondary | ICD-10-CM | POA: Diagnosis not present

## 2023-03-31 ENCOUNTER — Other Ambulatory Visit
Admission: RE | Admit: 2023-03-31 | Discharge: 2023-03-31 | Disposition: A | Discharge status: Home / Self Care | Payer: Medicare Other | Source: Ambulatory Visit | Attending: Nephrology | Admitting: Nephrology

## 2023-03-31 DIAGNOSIS — D509 Iron deficiency anemia, unspecified: Secondary | ICD-10-CM | POA: Diagnosis not present

## 2023-03-31 DIAGNOSIS — N186 End stage renal disease: Secondary | ICD-10-CM | POA: Diagnosis not present

## 2023-03-31 DIAGNOSIS — R7889 Finding of other specified substances, not normally found in blood: Secondary | ICD-10-CM | POA: Diagnosis not present

## 2023-03-31 DIAGNOSIS — Z992 Dependence on renal dialysis: Secondary | ICD-10-CM | POA: Diagnosis not present

## 2023-03-31 DIAGNOSIS — N250 Renal osteodystrophy: Secondary | ICD-10-CM | POA: Diagnosis not present

## 2023-03-31 DIAGNOSIS — N2581 Secondary hyperparathyroidism of renal origin: Secondary | ICD-10-CM | POA: Diagnosis not present

## 2023-03-31 DIAGNOSIS — D631 Anemia in chronic kidney disease: Secondary | ICD-10-CM | POA: Diagnosis not present

## 2023-03-31 LAB — LITHIUM LEVEL: Lithium Lvl: 0.62 mmol/L (ref 0.60–1.20)

## 2023-04-03 ENCOUNTER — Other Ambulatory Visit: Payer: Self-pay | Admitting: Psychiatry

## 2023-04-03 DIAGNOSIS — D631 Anemia in chronic kidney disease: Secondary | ICD-10-CM | POA: Diagnosis not present

## 2023-04-03 DIAGNOSIS — N186 End stage renal disease: Secondary | ICD-10-CM | POA: Diagnosis not present

## 2023-04-03 DIAGNOSIS — N250 Renal osteodystrophy: Secondary | ICD-10-CM | POA: Diagnosis not present

## 2023-04-03 DIAGNOSIS — D509 Iron deficiency anemia, unspecified: Secondary | ICD-10-CM | POA: Diagnosis not present

## 2023-04-03 DIAGNOSIS — F3176 Bipolar disorder, in full remission, most recent episode depressed: Secondary | ICD-10-CM

## 2023-04-03 DIAGNOSIS — N2581 Secondary hyperparathyroidism of renal origin: Secondary | ICD-10-CM | POA: Diagnosis not present

## 2023-04-03 DIAGNOSIS — Z992 Dependence on renal dialysis: Secondary | ICD-10-CM | POA: Diagnosis not present

## 2023-04-05 DIAGNOSIS — Z992 Dependence on renal dialysis: Secondary | ICD-10-CM | POA: Diagnosis not present

## 2023-04-05 DIAGNOSIS — D509 Iron deficiency anemia, unspecified: Secondary | ICD-10-CM | POA: Diagnosis not present

## 2023-04-05 DIAGNOSIS — N250 Renal osteodystrophy: Secondary | ICD-10-CM | POA: Diagnosis not present

## 2023-04-05 DIAGNOSIS — N186 End stage renal disease: Secondary | ICD-10-CM | POA: Diagnosis not present

## 2023-04-05 DIAGNOSIS — D631 Anemia in chronic kidney disease: Secondary | ICD-10-CM | POA: Diagnosis not present

## 2023-04-05 DIAGNOSIS — N2581 Secondary hyperparathyroidism of renal origin: Secondary | ICD-10-CM | POA: Diagnosis not present

## 2023-04-07 ENCOUNTER — Telehealth: Payer: Self-pay | Admitting: Psychiatry

## 2023-04-07 DIAGNOSIS — N2581 Secondary hyperparathyroidism of renal origin: Secondary | ICD-10-CM | POA: Diagnosis not present

## 2023-04-07 DIAGNOSIS — D509 Iron deficiency anemia, unspecified: Secondary | ICD-10-CM | POA: Diagnosis not present

## 2023-04-07 DIAGNOSIS — N250 Renal osteodystrophy: Secondary | ICD-10-CM | POA: Diagnosis not present

## 2023-04-07 DIAGNOSIS — Z992 Dependence on renal dialysis: Secondary | ICD-10-CM | POA: Diagnosis not present

## 2023-04-07 DIAGNOSIS — D631 Anemia in chronic kidney disease: Secondary | ICD-10-CM | POA: Diagnosis not present

## 2023-04-07 DIAGNOSIS — N186 End stage renal disease: Secondary | ICD-10-CM | POA: Diagnosis not present

## 2023-04-07 NOTE — Telephone Encounter (Signed)
I have reviewed lithium level-dated 03/30/2022-0.62-therapeutic. Received from [facility].

## 2023-04-09 DIAGNOSIS — N250 Renal osteodystrophy: Secondary | ICD-10-CM | POA: Diagnosis not present

## 2023-04-09 DIAGNOSIS — Z992 Dependence on renal dialysis: Secondary | ICD-10-CM | POA: Diagnosis not present

## 2023-04-09 DIAGNOSIS — D631 Anemia in chronic kidney disease: Secondary | ICD-10-CM | POA: Diagnosis not present

## 2023-04-09 DIAGNOSIS — D509 Iron deficiency anemia, unspecified: Secondary | ICD-10-CM | POA: Diagnosis not present

## 2023-04-09 DIAGNOSIS — N2581 Secondary hyperparathyroidism of renal origin: Secondary | ICD-10-CM | POA: Diagnosis not present

## 2023-04-09 DIAGNOSIS — N186 End stage renal disease: Secondary | ICD-10-CM | POA: Diagnosis not present

## 2023-04-12 ENCOUNTER — Other Ambulatory Visit
Admission: RE | Admit: 2023-04-12 | Discharge: 2023-04-12 | Disposition: A | Discharge status: Home / Self Care | Payer: Medicare Other | Source: Ambulatory Visit | Attending: Nephrology | Admitting: Nephrology

## 2023-04-12 DIAGNOSIS — Z992 Dependence on renal dialysis: Secondary | ICD-10-CM | POA: Diagnosis not present

## 2023-04-12 DIAGNOSIS — N250 Renal osteodystrophy: Secondary | ICD-10-CM | POA: Diagnosis not present

## 2023-04-12 DIAGNOSIS — R7889 Finding of other specified substances, not normally found in blood: Secondary | ICD-10-CM | POA: Diagnosis not present

## 2023-04-12 DIAGNOSIS — D631 Anemia in chronic kidney disease: Secondary | ICD-10-CM | POA: Diagnosis not present

## 2023-04-12 DIAGNOSIS — N2581 Secondary hyperparathyroidism of renal origin: Secondary | ICD-10-CM | POA: Diagnosis not present

## 2023-04-12 DIAGNOSIS — D509 Iron deficiency anemia, unspecified: Secondary | ICD-10-CM | POA: Diagnosis not present

## 2023-04-12 DIAGNOSIS — N186 End stage renal disease: Secondary | ICD-10-CM | POA: Diagnosis not present

## 2023-04-12 LAB — LITHIUM LEVEL: Lithium Lvl: 0.73 mmol/L (ref 0.60–1.20)

## 2023-04-14 ENCOUNTER — Telehealth: Payer: Self-pay | Admitting: Psychiatry

## 2023-04-14 DIAGNOSIS — N250 Renal osteodystrophy: Secondary | ICD-10-CM | POA: Diagnosis not present

## 2023-04-14 DIAGNOSIS — N186 End stage renal disease: Secondary | ICD-10-CM | POA: Diagnosis not present

## 2023-04-14 DIAGNOSIS — N2581 Secondary hyperparathyroidism of renal origin: Secondary | ICD-10-CM | POA: Diagnosis not present

## 2023-04-14 DIAGNOSIS — D509 Iron deficiency anemia, unspecified: Secondary | ICD-10-CM | POA: Diagnosis not present

## 2023-04-14 DIAGNOSIS — D631 Anemia in chronic kidney disease: Secondary | ICD-10-CM | POA: Diagnosis not present

## 2023-04-14 DIAGNOSIS — Z992 Dependence on renal dialysis: Secondary | ICD-10-CM | POA: Diagnosis not present

## 2023-04-14 NOTE — Telephone Encounter (Signed)
I have reviewed lithium level received from [facility] -dated 04/12/2023-0.73-therapeutic.

## 2023-04-15 DIAGNOSIS — Z961 Presence of intraocular lens: Secondary | ICD-10-CM | POA: Diagnosis not present

## 2023-04-15 DIAGNOSIS — H47012 Ischemic optic neuropathy, left eye: Secondary | ICD-10-CM | POA: Diagnosis not present

## 2023-04-15 DIAGNOSIS — H353131 Nonexudative age-related macular degeneration, bilateral, early dry stage: Secondary | ICD-10-CM | POA: Diagnosis not present

## 2023-04-16 ENCOUNTER — Other Ambulatory Visit: Payer: Self-pay | Admitting: Psychiatry

## 2023-04-16 DIAGNOSIS — N2581 Secondary hyperparathyroidism of renal origin: Secondary | ICD-10-CM | POA: Diagnosis not present

## 2023-04-16 DIAGNOSIS — Z992 Dependence on renal dialysis: Secondary | ICD-10-CM | POA: Diagnosis not present

## 2023-04-16 DIAGNOSIS — F3175 Bipolar disorder, in partial remission, most recent episode depressed: Secondary | ICD-10-CM

## 2023-04-16 DIAGNOSIS — N186 End stage renal disease: Secondary | ICD-10-CM | POA: Diagnosis not present

## 2023-04-16 DIAGNOSIS — D631 Anemia in chronic kidney disease: Secondary | ICD-10-CM | POA: Diagnosis not present

## 2023-04-16 DIAGNOSIS — D509 Iron deficiency anemia, unspecified: Secondary | ICD-10-CM | POA: Diagnosis not present

## 2023-04-16 DIAGNOSIS — G4701 Insomnia due to medical condition: Secondary | ICD-10-CM

## 2023-04-16 DIAGNOSIS — N250 Renal osteodystrophy: Secondary | ICD-10-CM | POA: Diagnosis not present

## 2023-04-17 ENCOUNTER — Other Ambulatory Visit: Payer: Self-pay | Admitting: Psychiatry

## 2023-04-17 DIAGNOSIS — F3175 Bipolar disorder, in partial remission, most recent episode depressed: Secondary | ICD-10-CM

## 2023-04-18 ENCOUNTER — Telehealth: Payer: Self-pay
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
## 2023-04-18 NOTE — Telephone Encounter (Signed)
tried to notify the pt but no answer and mailbox full

## 2023-04-18 NOTE — Telephone Encounter (Signed)
pt left message that he needed a refill on the doxepin. pt last seen on 12-18 next appt 2-12.

## 2023-04-19 ENCOUNTER — Other Ambulatory Visit
Admission: RE | Admit: 2023-04-19 | Discharge: 2023-04-19 | Disposition: A | Discharge status: Home / Self Care | Payer: Medicare Other | Source: Ambulatory Visit | Attending: Nephrology | Admitting: Nephrology

## 2023-04-19 DIAGNOSIS — N2581 Secondary hyperparathyroidism of renal origin: Secondary | ICD-10-CM | POA: Diagnosis not present

## 2023-04-19 DIAGNOSIS — D631 Anemia in chronic kidney disease: Secondary | ICD-10-CM | POA: Diagnosis not present

## 2023-04-19 DIAGNOSIS — N186 End stage renal disease: Secondary | ICD-10-CM | POA: Diagnosis not present

## 2023-04-19 DIAGNOSIS — D509 Iron deficiency anemia, unspecified: Secondary | ICD-10-CM | POA: Diagnosis not present

## 2023-04-19 DIAGNOSIS — R7889 Finding of other specified substances, not normally found in blood: Secondary | ICD-10-CM | POA: Diagnosis not present

## 2023-04-19 DIAGNOSIS — N250 Renal osteodystrophy: Secondary | ICD-10-CM | POA: Diagnosis not present

## 2023-04-19 DIAGNOSIS — Z992 Dependence on renal dialysis: Secondary | ICD-10-CM | POA: Diagnosis not present

## 2023-04-19 LAB — LITHIUM LEVEL: Lithium Lvl: 0.67 mmol/L (ref 0.60–1.20)

## 2023-04-20 ENCOUNTER — Telehealth: Payer: Self-pay | Admitting: Psychiatry

## 2023-04-20 NOTE — Telephone Encounter (Signed)
I have reviewed labs received from [facility] - Li level -0.67-therapeutic.

## 2023-04-21 ENCOUNTER — Other Ambulatory Visit: Payer: Self-pay | Admitting: Nurse Practitioner

## 2023-04-21 DIAGNOSIS — N2581 Secondary hyperparathyroidism of renal origin: Secondary | ICD-10-CM | POA: Diagnosis not present

## 2023-04-21 DIAGNOSIS — N186 End stage renal disease: Secondary | ICD-10-CM | POA: Diagnosis not present

## 2023-04-21 DIAGNOSIS — D509 Iron deficiency anemia, unspecified: Secondary | ICD-10-CM | POA: Diagnosis not present

## 2023-04-21 DIAGNOSIS — Z992 Dependence on renal dialysis: Secondary | ICD-10-CM | POA: Diagnosis not present

## 2023-04-21 DIAGNOSIS — E782 Mixed hyperlipidemia: Secondary | ICD-10-CM

## 2023-04-21 DIAGNOSIS — N250 Renal osteodystrophy: Secondary | ICD-10-CM | POA: Diagnosis not present

## 2023-04-21 DIAGNOSIS — D631 Anemia in chronic kidney disease: Secondary | ICD-10-CM | POA: Diagnosis not present

## 2023-04-22 ENCOUNTER — Ambulatory Visit: Payer: Self-pay

## 2023-04-22 DIAGNOSIS — Z992 Dependence on renal dialysis: Secondary | ICD-10-CM | POA: Diagnosis not present

## 2023-04-22 DIAGNOSIS — N186 End stage renal disease: Secondary | ICD-10-CM | POA: Diagnosis not present

## 2023-04-22 NOTE — Telephone Encounter (Signed)
Message from Alessandra Bevels sent at 04/22/2023 10:28 AM EST  Summary: Advice Nose running   Pt is calling to report that every time he eats his nose runs. No available appts. Please advise        Called pt and unable to LM, VM full.

## 2023-04-22 NOTE — Telephone Encounter (Signed)
Requested Prescriptions  Pending Prescriptions Disp Refills   rosuvastatin (CRESTOR) 10 MG tablet [Pharmacy Med Name: ROSUVASTATIN TABS 10MG ] 90 tablet 0    Sig: TAKE 1 TABLET DAILY     Cardiovascular:  Antilipid - Statins 2 Failed - 04/22/2023  8:43 AM      Failed - Cr in normal range and within 360 days    Creatinine  Date Value Ref Range Status  03/08/2014 2.22 (H) 0.60 - 1.30 mg/dL Final   Creatinine, Ser  Date Value Ref Range Status  02/07/2023 6.72 (H) 0.76 - 1.27 mg/dL Final   Creatinine, Urine  Date Value Ref Range Status  08/01/2019 194 mg/dL Final    Comment:    Performed at [facility], [address] [place], [place] [address]         Failed - Lipid Panel in normal range within the last 12 months    Cholesterol, Total  Date Value Ref Range Status  02/07/2023 124 100 - 199 mg/dL Final   Cholesterol Piccolo, Waived  Date Value Ref Range Status  10/03/2017 180 <200 mg/dL Final    Comment:                            Desirable                <200                         Borderline High      200- 239                         High                     >239    LDL Chol Calc (NIH)  Date Value Ref Range Status  02/07/2023 50 0 - 99 mg/dL Final   HDL  Date Value Ref Range Status  02/07/2023 47 >39 mg/dL Final   Triglycerides  Date Value Ref Range Status  02/07/2023 160 (H) 0 - 149 mg/dL Final   Triglycerides Piccolo,Waived  Date Value Ref Range Status  10/03/2017 252 (H) <150 mg/dL Final    Comment:                            Normal                   <150                         Borderline High     150 - 199                         High                200 - 499                         Very High                >499          Passed - Patient is not pregnant      Passed - Valid encounter within last 12 months    Recent Outpatient Visits  2 months ago Encounter for annual wellness exam in Medicare patient   [facility] Larae Grooms, NP   5 months ago Bipolar I disorder, most recent episode depressed (HCC)   [facility] Mecum, Erin E, PA-C   1 year ago Hypertension associated with diabetes [facility])   [facility] Larae Grooms, NP   1 year ago Annual physical exam   [facility] Larae Grooms, NP   1 year ago Cellulitis of left lower leg   [facility] [place], Dorie Rank, NP       Future Appointments             In 4 weeks McGowan, Elana Alm [facility] [place]   In 3 months Larae Grooms, NP [facility]

## 2023-04-22 NOTE — Telephone Encounter (Signed)
Patient has ben scheduled 

## 2023-04-22 NOTE — Telephone Encounter (Addendum)
Message from Alessandra Bevels sent at 04/22/2023 10:28 AM EST  Summary: Advice Nose running   Pt is calling to report that every time he eats his nose runs. No available appts. Please advise        Third attempt made to reach pt without success. Routing to office. Reason for Disposition . Third attempt to contact caller AND no contact made. Phone number verified.  Protocols used: No Contact or Duplicate Contact Call-A-AH

## 2023-04-22 NOTE — Telephone Encounter (Signed)
Reason for Disposition . [1] Nasal allergies AND [2] only certain times of year AND [3] hay fever diagnosis has never been confirmed by a doctor (or NP/PA)    Patient only has drainage when he eats- patient is taking medication- but it has not helped. Patient is ready to try different approach/option.  Answer Assessment - Initial Assessment Questions 1. SYMPTOM: "What's the main symptom you're concerned about?" (e.g., runny nose, stuffiness, sneezing, itching)     Patient states she has nasal drainage with eating 2. SEVERITY: "How bad is it?" "What does it keep you from doing?" (e.g., sleeping, working)      Nothing like when eating- no other times 3. EYES: "Are the eyes also red, watery, and itchy?"      no 4. TRIGGER: "What pollen or other allergic substance do you think is causing the symptoms?"      Eating- food 5. TREATMENT: "What medicine are you using?" "What medicine worked best in the past?"     Loratadine- 1 year  6. OTHER SYMPTOMS: "Do you have any other symptoms?" (e.g., coughing, difficulty breathing, wheezing)     No allergy symptoms  Protocols used: Nasal Allergies (Hay Fever)-A-AH

## 2023-04-22 NOTE — Telephone Encounter (Signed)
  Chief Complaint: nasal symptoms- continuous nasal drainage when eats only Symptoms: see above Frequency: ongoing-chronic Pertinent Negatives: Patient denies other allergy symptoms Disposition: [] ED /[] Urgent Care (no appt availability in office) / [] Appointment(In office/virtual)/ []  [facility]/ [] Home Care/ [] Refused Recommended Disposition /[] [facility]/ [x]  Follow-up with PCP Additional Notes: Needs appointment- office is closed for lunch - will send message for assistance with scheduling

## 2023-04-22 NOTE — Telephone Encounter (Signed)
Summary: Advice Nose running   Pt is calling to report that every time he eats his nose runs. No available appts. Please advise       Voice mailbox full, unable to leave a message.

## 2023-04-23 DIAGNOSIS — D509 Iron deficiency anemia, unspecified: Secondary | ICD-10-CM | POA: Diagnosis not present

## 2023-04-23 DIAGNOSIS — N186 End stage renal disease: Secondary | ICD-10-CM | POA: Diagnosis not present

## 2023-04-23 DIAGNOSIS — Z992 Dependence on renal dialysis: Secondary | ICD-10-CM | POA: Diagnosis not present

## 2023-04-23 DIAGNOSIS — N2581 Secondary hyperparathyroidism of renal origin: Secondary | ICD-10-CM | POA: Diagnosis not present

## 2023-04-23 DIAGNOSIS — N250 Renal osteodystrophy: Secondary | ICD-10-CM | POA: Diagnosis not present

## 2023-04-23 DIAGNOSIS — D631 Anemia in chronic kidney disease: Secondary | ICD-10-CM | POA: Diagnosis not present

## 2023-04-26 ENCOUNTER — Other Ambulatory Visit
Admission: RE | Admit: 2023-04-26 | Discharge: 2023-04-26 | Disposition: A | Discharge status: Home / Self Care | Payer: Medicare Other | Source: Ambulatory Visit | Attending: Psychiatry | Admitting: Psychiatry

## 2023-04-26 DIAGNOSIS — R7889 Finding of other specified substances, not normally found in blood: Secondary | ICD-10-CM | POA: Insufficient documentation

## 2023-04-26 DIAGNOSIS — D631 Anemia in chronic kidney disease: Secondary | ICD-10-CM | POA: Diagnosis not present

## 2023-04-26 DIAGNOSIS — N186 End stage renal disease: Secondary | ICD-10-CM | POA: Diagnosis not present

## 2023-04-26 DIAGNOSIS — D509 Iron deficiency anemia, unspecified: Secondary | ICD-10-CM | POA: Diagnosis not present

## 2023-04-26 DIAGNOSIS — N250 Renal osteodystrophy: Secondary | ICD-10-CM | POA: Diagnosis not present

## 2023-04-26 DIAGNOSIS — N2581 Secondary hyperparathyroidism of renal origin: Secondary | ICD-10-CM | POA: Diagnosis not present

## 2023-04-26 DIAGNOSIS — Z992 Dependence on renal dialysis: Secondary | ICD-10-CM | POA: Diagnosis not present

## 2023-04-26 LAB — LITHIUM LEVEL: Lithium Lvl: 0.69 mmol/L (ref 0.60–1.20)

## 2023-04-28 DIAGNOSIS — N2581 Secondary hyperparathyroidism of renal origin: Secondary | ICD-10-CM | POA: Diagnosis not present

## 2023-04-28 DIAGNOSIS — D631 Anemia in chronic kidney disease: Secondary | ICD-10-CM | POA: Diagnosis not present

## 2023-04-28 DIAGNOSIS — Z992 Dependence on renal dialysis: Secondary | ICD-10-CM | POA: Diagnosis not present

## 2023-04-28 DIAGNOSIS — D509 Iron deficiency anemia, unspecified: Secondary | ICD-10-CM | POA: Diagnosis not present

## 2023-04-28 DIAGNOSIS — N186 End stage renal disease: Secondary | ICD-10-CM | POA: Diagnosis not present

## 2023-04-28 DIAGNOSIS — N250 Renal osteodystrophy: Secondary | ICD-10-CM | POA: Diagnosis not present

## 2023-04-30 DIAGNOSIS — Z992 Dependence on renal dialysis: Secondary | ICD-10-CM | POA: Diagnosis not present

## 2023-04-30 DIAGNOSIS — D631 Anemia in chronic kidney disease: Secondary | ICD-10-CM | POA: Diagnosis not present

## 2023-04-30 DIAGNOSIS — D509 Iron deficiency anemia, unspecified: Secondary | ICD-10-CM | POA: Diagnosis not present

## 2023-04-30 DIAGNOSIS — N250 Renal osteodystrophy: Secondary | ICD-10-CM | POA: Diagnosis not present

## 2023-04-30 DIAGNOSIS — N186 End stage renal disease: Secondary | ICD-10-CM | POA: Diagnosis not present

## 2023-04-30 DIAGNOSIS — N2581 Secondary hyperparathyroidism of renal origin: Secondary | ICD-10-CM | POA: Diagnosis not present

## 2023-05-02 ENCOUNTER — Telehealth: Payer: Self-pay | Admitting: Psychiatry

## 2023-05-02 ENCOUNTER — Encounter: Payer: Self-pay | Admitting: Nurse Practitioner

## 2023-05-02 ENCOUNTER — Ambulatory Visit: Payer: Medicare Other | Admitting: Nurse Practitioner

## 2023-05-02 VITALS — BP 156/79 | HR 59 | Temp 97.7°F | Wt 185.2 lb

## 2023-05-02 DIAGNOSIS — J302 Other seasonal allergic rhinitis: Secondary | ICD-10-CM

## 2023-05-02 NOTE — Progress Notes (Signed)
 BP (!) 156/79 (BP Location: Right Arm, Patient Position: Sitting, Cuff Size: Normal)   Pulse (!) 59   Temp 97.7 F (36.5 C) (Oral)   Wt 185 lb 3.2 oz (84 kg)   BMI 29.89 kg/m    Subjective:    Patient ID: Luis Hicks., male    DOB: Feb 21, 1946, 78 y.o.   MRN: [id number]  HPI: Luis Rodick. is a 78 y.o. male  Chief Complaint  Patient presents with   Allergic Reaction    When he eats his nose starts running been going on for a couple of months  He already been taking one allergy. Might need a change in allergy meds.    Patient stats he has had a runny nose off and on for several months.  He does take Claritin  daily.  Feels like he might need to change allergy pills.  He denies headache, congestion, ear pain, throat pain or cough.  Relevant past medical, surgical, family and social history reviewed and updated as indicated. Interim medical history since our last visit reviewed. Allergies and medications reviewed and updated.  Review of Systems  HENT:  Positive for rhinorrhea. Negative for congestion, ear pain and sore throat.   Respiratory:  Negative for cough.   Neurological:  Negative for headaches.    Per HPI unless specifically indicated above     Objective:    BP (!) 156/79 (BP Location: Right Arm, Patient Position: Sitting, Cuff Size: Normal)   Pulse (!) 59   Temp 97.7 F (36.5 C) (Oral)   Wt 185 lb 3.2 oz (84 kg)   BMI 29.89 kg/m   Wt Readings from Last 3 Encounters:  05/02/23 185 lb 3.2 oz (84 kg)  02/07/23 178 lb 9.6 oz (81 kg)  12/08/22 175 lb (79.4 kg)    Physical Exam Vitals and nursing note reviewed.  Constitutional:      General: He is not in acute distress.    Appearance: Normal appearance. He is not ill-appearing, toxic-appearing or diaphoretic.  HENT:     Head: Normocephalic.     Right Ear: Tympanic membrane and external ear normal.     Left Ear: Tympanic membrane and external ear normal.     Nose: Rhinorrhea present. No congestion.      Mouth/Throat:     Mouth: Mucous membranes are moist.  Eyes:     General:        Right eye: No discharge.        Left eye: No discharge.     Extraocular Movements: Extraocular movements intact.     Conjunctiva/sclera: Conjunctivae normal.     Pupils: Pupils are equal, round, and reactive to light.  Cardiovascular:     Rate and Rhythm: Normal rate and regular rhythm.     Heart sounds: No murmur heard. Pulmonary:     Effort: Pulmonary effort is normal. No respiratory distress.     Breath sounds: Normal breath sounds. No wheezing, rhonchi or rales.  Abdominal:     General: Abdomen is flat. Bowel sounds are normal.  Musculoskeletal:     Cervical back: Normal range of motion and neck supple.  Skin:    General: Skin is warm and dry.     Capillary Refill: Capillary refill takes less than 2 seconds.  Neurological:     General: No focal deficit present.     Mental Status: He is alert and oriented to person, place, and time.  Psychiatric:  Mood and Affect: Mood normal.        Behavior: Behavior normal.        Thought Content: Thought content normal.        Judgment: Judgment normal.     Results for orders placed or performed during the hospital encounter of 04/26/23  Lithium  level   Collection Time: 04/26/23  4:00 PM  Result Value Ref Range   Lithium  Lvl 0.69 0.60 - 1.20 mmol/L      Assessment & Plan:   Problem List Items Addressed This Visit   None Visit Diagnoses       Seasonal allergies    -  Primary   Recommend changing to Zyrtec and using his flonase .  Patient agrees to plan.  Follow up if not improved.        Follow up plan: Return if symptoms worsen or fail to improve.

## 2023-05-02 NOTE — Telephone Encounter (Signed)
 I have reviewed lithium  level-04/26/23-0.69-therapeutic.

## 2023-05-03 ENCOUNTER — Other Ambulatory Visit
Admission: RE | Admit: 2023-05-03 | Discharge: 2023-05-03 | Disposition: A | Discharge status: Home / Self Care | Payer: Medicare Other | Source: Ambulatory Visit | Attending: Nephrology | Admitting: Nephrology

## 2023-05-03 DIAGNOSIS — N250 Renal osteodystrophy: Secondary | ICD-10-CM | POA: Diagnosis not present

## 2023-05-03 DIAGNOSIS — D509 Iron deficiency anemia, unspecified: Secondary | ICD-10-CM | POA: Diagnosis not present

## 2023-05-03 DIAGNOSIS — Z992 Dependence on renal dialysis: Secondary | ICD-10-CM | POA: Diagnosis not present

## 2023-05-03 DIAGNOSIS — N186 End stage renal disease: Secondary | ICD-10-CM | POA: Diagnosis not present

## 2023-05-03 DIAGNOSIS — D631 Anemia in chronic kidney disease: Secondary | ICD-10-CM | POA: Diagnosis not present

## 2023-05-03 DIAGNOSIS — N2581 Secondary hyperparathyroidism of renal origin: Secondary | ICD-10-CM | POA: Diagnosis not present

## 2023-05-03 DIAGNOSIS — R7889 Finding of other specified substances, not normally found in blood: Secondary | ICD-10-CM | POA: Diagnosis not present

## 2023-05-03 LAB — LITHIUM LEVEL: Lithium Lvl: 0.74 mmol/L (ref 0.60–1.20)

## 2023-05-04 ENCOUNTER — Ambulatory Visit: Payer: Medicare Other | Admitting: Psychiatry

## 2023-05-04 ENCOUNTER — Encounter: Payer: Self-pay | Admitting: Psychiatry

## 2023-05-04 ENCOUNTER — Ambulatory Visit (INDEPENDENT_AMBULATORY_CARE_PROVIDER_SITE_OTHER): Payer: Medicare Other | Admitting: Nurse Practitioner

## 2023-05-04 ENCOUNTER — Encounter (INDEPENDENT_AMBULATORY_CARE_PROVIDER_SITE_OTHER): Payer: Medicare Other

## 2023-05-04 VITALS — BP 157/69 | HR 66 | Temp 98.0°F | Ht 66.0 in | Wt 183.6 lb

## 2023-05-04 DIAGNOSIS — G3184 Mild cognitive impairment, so stated: Secondary | ICD-10-CM | POA: Diagnosis not present

## 2023-05-04 DIAGNOSIS — G4701 Insomnia due to medical condition: Secondary | ICD-10-CM

## 2023-05-04 DIAGNOSIS — R259 Unspecified abnormal involuntary movements: Secondary | ICD-10-CM

## 2023-05-04 DIAGNOSIS — F3176 Bipolar disorder, in full remission, most recent episode depressed: Secondary | ICD-10-CM

## 2023-05-04 MED ORDER — ESZOPICLONE 1 MG PO TABS: 1.0000 mg | ORAL_TABLET | Freq: Every evening | ORAL | 0 refills | Status: DC | PRN

## 2023-05-04 MED ORDER — DOXEPIN HCL 25 MG PO CAPS: 25.0000 mg | ORAL_CAPSULE | Freq: Every day | ORAL | 0 refills | Status: DC
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
## 2023-05-04 NOTE — Patient Instructions (Addendum)
Please stop taking Trazodone, please take it out of your pillbox.  Please wean off the Doxepin.  You are currently on a 50 mg which I am reducing to 25 mg for the next 1 week.  Please stop after 1 week.  Please start taking Donepezil prescribed by neurologist during the day, it is likely this medication could be causing insomnia.  However if it does make you drowsy when you take it in the morning try to take it in the evening around 4 or 5 PM.  Once you stop both the Trazodone and the Doxepin please start taking Lunesta or Eszopiclone which I am sending to the pharmacy today for sleep.  Eszopiclone Tablets What is this medication? ESZOPICLONE (es ZOE pi clone) treats insomnia. It helps you go to sleep faster and stay asleep through the night. It is often used for a short period of time. This medicine may be used for other purposes; ask your health care provider or pharmacist if you have questions. COMMON BRAND NAME(S): Lunesta What should I tell my care team before I take this medication? They need to know if you have any of these conditions: Depression Liver disease Lung or breathing disease, such as asthma or COPD Substance use disorder Sleep-walking, driving, eating, or other activity while not fully awake after taking a sleep medication Suicidal thoughts, plans, or attempt by you or a family member An unusual or allergic reaction to eszopiclone, other medications, foods, dyes, or preservatives Pregnant or trying to get pregnant Breastfeeding How should I use this medication? Take this medication by mouth with a glass of water. Follow the directions on the prescription label. It is better to take this medication on an empty stomach and only when you are ready for bed. Do not take your medication more often than directed. If you have been taking this medication for several weeks and suddenly stop taking it, you may get unpleasant withdrawal symptoms. Your care team may want to gradually  reduce the dose. Do not stop taking this medication on your own. Always follow your care team's advice. A special MedGuide will be given to you by the pharmacist with each prescription and refill. Be sure to read this information carefully each time. Talk to your care team about the use of this medication in children. Special care may be needed. Overdosage: If you think you have taken too much of this medicine contact a poison control center or emergency room at once. NOTE: This medicine is only for you. Do not share this medicine with others. What if I miss a dose? This does not apply. This medication should only be taken immediately before going to sleep. Do not take double or extra doses. What may interact with this medication? Lorazepam Medications for fungal infections, such as ketoconazole, fluconazole, itraconazole Olanzapine Supplements, such as melatonin, St. John's wort, valerian This list may not describe all possible interactions. Give your health care provider a list of all the medicines, herbs, non-prescription drugs, or dietary supplements you use. Also tell them if you smoke, drink alcohol, or use illegal drugs. Some items may interact with your medicine. What should I watch for while using this medication? Visit your care team for regular checks on your progress. Keep a regular sleep schedule by going to bed at about the same time nightly. Avoid caffeine-containing drinks in the evening hours, as caffeine can cause trouble with falling asleep. Talk to your care team if you still have trouble sleeping. You may do unusual  sleep behaviors or activities you do not remember the day after taking this medication. Activities include driving, making or eating food, talking on the phone, sexual activity, or sleep walking. Stop taking this medication and call your care team right away if you find out you have done activities like this. Plan to go to bed and stay in bed for a full night (7 to 8  hours) after you take this medication. You may still be drowsy the morning after taking this medication. This medication may affect your coordination, reaction time, or judgment. Do not drive or operate machinery until you know how this medication affects you. Sit up or stand slowly to reduce the risk of dizzy or fainting spells. If you or your family notice any changes in your behavior, such as new or worsening depression, thoughts of harming yourself, anxiety, other unusual or disturbing thoughts, or memory loss, call your care team right away. After you stop taking this medication, you may have trouble falling asleep. This is called rebound insomnia. This problem usually goes away on its own after 1 or 2 nights. What side effects may I notice from receiving this medication? Side effects that you should report to your care team as soon as possible: Allergic reactions or angioedema--skin rash, itching, hives, swelling of the face, eyes, lips, tongue, arms, or legs, trouble swallowing or breathing CNS depression--slow or shallow breathing, shortness of breath, feeling faint, dizziness, confusion, trouble staying awake Mood and behavior changes--anxiety, nervousness, confusion, hallucinations, irritability, hostility, thoughts of suicide or self-harm, worsening mood, feelings of depression Unusual sleep behaviors or activities you do not remember such as driving, eating, or sexual activity Side effects that usually do not require medical attention (report to your care team if they continue or are bothersome): Dizziness Drowsiness the day after use Dry mouth Headache Metallic taste in mouth This list may not describe all possible side effects. Call your doctor for medical advice about side effects. You may report side effects to FDA at 1-800-FDA-1088. Where should I keep my medication? Keep out of the reach of children and pets. This medication can be abused. Keep it in a safe place to protect it from  theft. Do not share it with anyone. It is only for you. Selling or giving away this medication is dangerous and against the law. Store at room temperature between 20 and 25 degrees C (68 and 77 degrees F). Get rid of any unused medication after the expiration date. This medication may cause harm and death if taken by other adults, children, or pets. It is important to get rid of this medication as soon as you no longer need it or it has expired. You can do this in two ways: Take the medication to a medication take-back program. Check with your pharmacy or law enforcement to find a location. If you cannot return the medication, check the label or package insert to see if the medication should be thrown out in the garbage or flushed down the toilet. If you are not sure, ask your care team. If it is safe to put it in the trash, take the medication out of the container. Mix the medication with cat litter, dirt, coffee grounds, or other unwanted substance. Seal the mixture in a bag or container. Put it in the trash. NOTE: This sheet is a summary. It may not cover all possible information. If you have questions about this medicine, talk to your doctor, pharmacist, or health care provider.  2024 Elsevier/Gold Standard (  2022-09-09 00:00:00) 

## 2023-05-05 ENCOUNTER — Other Ambulatory Visit: Payer: Self-pay | Admitting: Psychiatry

## 2023-05-05 DIAGNOSIS — N2581 Secondary hyperparathyroidism of renal origin: Secondary | ICD-10-CM | POA: Diagnosis not present

## 2023-05-05 DIAGNOSIS — N250 Renal osteodystrophy: Secondary | ICD-10-CM | POA: Diagnosis not present

## 2023-05-05 DIAGNOSIS — N186 End stage renal disease: Secondary | ICD-10-CM | POA: Diagnosis not present

## 2023-05-05 DIAGNOSIS — D631 Anemia in chronic kidney disease: Secondary | ICD-10-CM | POA: Diagnosis not present

## 2023-05-05 DIAGNOSIS — D509 Iron deficiency anemia, unspecified: Secondary | ICD-10-CM | POA: Diagnosis not present

## 2023-05-05 DIAGNOSIS — F314 Bipolar disorder, current episode depressed, severe, without psychotic features: Secondary | ICD-10-CM

## 2023-05-05 DIAGNOSIS — Z992 Dependence on renal dialysis: Secondary | ICD-10-CM | POA: Diagnosis not present

## 2023-05-07 DIAGNOSIS — N2581 Secondary hyperparathyroidism of renal origin: Secondary | ICD-10-CM | POA: Diagnosis not present

## 2023-05-07 DIAGNOSIS — Z992 Dependence on renal dialysis: Secondary | ICD-10-CM | POA: Diagnosis not present

## 2023-05-07 DIAGNOSIS — D509 Iron deficiency anemia, unspecified: Secondary | ICD-10-CM | POA: Diagnosis not present

## 2023-05-07 DIAGNOSIS — D631 Anemia in chronic kidney disease: Secondary | ICD-10-CM | POA: Diagnosis not present

## 2023-05-07 DIAGNOSIS — N250 Renal osteodystrophy: Secondary | ICD-10-CM | POA: Diagnosis not present

## 2023-05-07 DIAGNOSIS — N186 End stage renal disease: Secondary | ICD-10-CM | POA: Diagnosis not present

## 2023-05-09 ENCOUNTER — Telehealth: Payer: Self-pay | Admitting: Psychiatry

## 2023-05-09 NOTE — Telephone Encounter (Signed)
I have reviewed lithium level-dated 05/03/2023-0.74-therapeutic  Received from nephrology-[facility] .

## 2023-05-10 ENCOUNTER — Other Ambulatory Visit
Admission: RE | Admit: 2023-05-10 | Discharge: 2023-05-10 | Disposition: A | Discharge status: Home / Self Care | Payer: Medicare Other | Source: Ambulatory Visit | Attending: Nephrology | Admitting: Nephrology

## 2023-05-10 DIAGNOSIS — Z992 Dependence on renal dialysis: Secondary | ICD-10-CM | POA: Diagnosis not present

## 2023-05-10 DIAGNOSIS — N250 Renal osteodystrophy: Secondary | ICD-10-CM | POA: Diagnosis not present

## 2023-05-10 DIAGNOSIS — R7889 Finding of other specified substances, not normally found in blood: Secondary | ICD-10-CM | POA: Insufficient documentation

## 2023-05-10 DIAGNOSIS — N186 End stage renal disease: Secondary | ICD-10-CM | POA: Diagnosis not present

## 2023-05-10 DIAGNOSIS — N2581 Secondary hyperparathyroidism of renal origin: Secondary | ICD-10-CM | POA: Diagnosis not present

## 2023-05-10 DIAGNOSIS — D631 Anemia in chronic kidney disease: Secondary | ICD-10-CM | POA: Diagnosis not present

## 2023-05-10 DIAGNOSIS — D509 Iron deficiency anemia, unspecified: Secondary | ICD-10-CM | POA: Diagnosis not present

## 2023-05-10 LAB — LITHIUM LEVEL: Lithium Lvl: 0.65 mmol/L (ref 0.60–1.20)

## 2023-05-14 ENCOUNTER — Other Ambulatory Visit: Payer: Self-pay | Admitting: Psychiatry

## 2023-05-14 DIAGNOSIS — N186 End stage renal disease: Secondary | ICD-10-CM | POA: Diagnosis not present

## 2023-05-14 DIAGNOSIS — F3175 Bipolar disorder, in partial remission, most recent episode depressed: Secondary | ICD-10-CM

## 2023-05-14 DIAGNOSIS — N250 Renal osteodystrophy: Secondary | ICD-10-CM | POA: Diagnosis not present

## 2023-05-14 DIAGNOSIS — Z992 Dependence on renal dialysis: Secondary | ICD-10-CM | POA: Diagnosis not present

## 2023-05-14 DIAGNOSIS — D631 Anemia in chronic kidney disease: Secondary | ICD-10-CM | POA: Diagnosis not present

## 2023-05-14 DIAGNOSIS — N2581 Secondary hyperparathyroidism of renal origin: Secondary | ICD-10-CM | POA: Diagnosis not present

## 2023-05-14 DIAGNOSIS — D509 Iron deficiency anemia, unspecified: Secondary | ICD-10-CM | POA: Diagnosis not present

## 2023-05-14 DIAGNOSIS — G4701 Insomnia due to medical condition: Secondary | ICD-10-CM

## 2023-05-15 DIAGNOSIS — D509 Iron deficiency anemia, unspecified: Secondary | ICD-10-CM | POA: Diagnosis not present

## 2023-05-15 DIAGNOSIS — N186 End stage renal disease: Secondary | ICD-10-CM | POA: Diagnosis not present

## 2023-05-15 DIAGNOSIS — D631 Anemia in chronic kidney disease: Secondary | ICD-10-CM | POA: Diagnosis not present

## 2023-05-15 DIAGNOSIS — Z992 Dependence on renal dialysis: Secondary | ICD-10-CM | POA: Diagnosis not present

## 2023-05-15 DIAGNOSIS — N250 Renal osteodystrophy: Secondary | ICD-10-CM | POA: Diagnosis not present

## 2023-05-15 DIAGNOSIS — N2581 Secondary hyperparathyroidism of renal origin: Secondary | ICD-10-CM | POA: Diagnosis not present

## 2023-05-17 ENCOUNTER — Other Ambulatory Visit
Admission: RE | Admit: 2023-05-17 | Discharge: 2023-05-17 | Disposition: A | Discharge status: Home / Self Care | Payer: Medicare Other | Source: Ambulatory Visit | Attending: Nephrology | Admitting: Nephrology

## 2023-05-17 DIAGNOSIS — D631 Anemia in chronic kidney disease: Secondary | ICD-10-CM | POA: Diagnosis not present

## 2023-05-17 DIAGNOSIS — Z992 Dependence on renal dialysis: Secondary | ICD-10-CM | POA: Diagnosis not present

## 2023-05-17 DIAGNOSIS — R7889 Finding of other specified substances, not normally found in blood: Secondary | ICD-10-CM | POA: Diagnosis not present

## 2023-05-17 DIAGNOSIS — N2581 Secondary hyperparathyroidism of renal origin: Secondary | ICD-10-CM | POA: Diagnosis not present

## 2023-05-17 DIAGNOSIS — D509 Iron deficiency anemia, unspecified: Secondary | ICD-10-CM | POA: Diagnosis not present

## 2023-05-17 DIAGNOSIS — N186 End stage renal disease: Secondary | ICD-10-CM | POA: Diagnosis not present

## 2023-05-17 DIAGNOSIS — N250 Renal osteodystrophy: Secondary | ICD-10-CM | POA: Diagnosis not present

## 2023-05-17 LAB — LITHIUM LEVEL: Lithium Lvl: 0.64 mmol/L (ref 0.60–1.20)

## 2023-05-18 DIAGNOSIS — L570 Actinic keratosis: Secondary | ICD-10-CM | POA: Diagnosis not present

## 2023-05-18 DIAGNOSIS — D0359 Melanoma in situ of other part of trunk: Secondary | ICD-10-CM | POA: Diagnosis not present

## 2023-05-18 DIAGNOSIS — D485 Neoplasm of uncertain behavior of skin: Secondary | ICD-10-CM | POA: Diagnosis not present

## 2023-05-18 DIAGNOSIS — D2272 Melanocytic nevi of left lower limb, including hip: Secondary | ICD-10-CM | POA: Diagnosis not present

## 2023-05-18 DIAGNOSIS — D2261 Melanocytic nevi of right upper limb, including shoulder: Secondary | ICD-10-CM | POA: Diagnosis not present

## 2023-05-18 DIAGNOSIS — D225 Melanocytic nevi of trunk: Secondary | ICD-10-CM | POA: Diagnosis not present

## 2023-05-18 DIAGNOSIS — Z85820 Personal history of malignant melanoma of skin: Secondary | ICD-10-CM | POA: Diagnosis not present

## 2023-05-18 DIAGNOSIS — D2262 Melanocytic nevi of left upper limb, including shoulder: Secondary | ICD-10-CM | POA: Diagnosis not present

## 2023-05-18 DIAGNOSIS — D2271 Melanocytic nevi of right lower limb, including hip: Secondary | ICD-10-CM | POA: Diagnosis not present

## 2023-05-18 DIAGNOSIS — Z85828 Personal history of other malignant neoplasm of skin: Secondary | ICD-10-CM | POA: Diagnosis not present

## 2023-05-19 ENCOUNTER — Other Ambulatory Visit: Payer: Self-pay | Admitting: Psychiatry

## 2023-05-19 DIAGNOSIS — D631 Anemia in chronic kidney disease: Secondary | ICD-10-CM | POA: Diagnosis not present

## 2023-05-19 DIAGNOSIS — N250 Renal osteodystrophy: Secondary | ICD-10-CM | POA: Diagnosis not present

## 2023-05-19 DIAGNOSIS — Z992 Dependence on renal dialysis: Secondary | ICD-10-CM | POA: Diagnosis not present

## 2023-05-19 DIAGNOSIS — G4701 Insomnia due to medical condition: Secondary | ICD-10-CM

## 2023-05-19 DIAGNOSIS — N2581 Secondary hyperparathyroidism of renal origin: Secondary | ICD-10-CM | POA: Diagnosis not present

## 2023-05-19 DIAGNOSIS — D509 Iron deficiency anemia, unspecified: Secondary | ICD-10-CM | POA: Diagnosis not present

## 2023-05-19 DIAGNOSIS — N186 End stage renal disease: Secondary | ICD-10-CM | POA: Diagnosis not present
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
## 2023-05-20 ENCOUNTER — Ambulatory Visit: Payer: Self-pay | Admitting: Urology

## 2023-05-20 DIAGNOSIS — N186 End stage renal disease: Secondary | ICD-10-CM | POA: Diagnosis not present

## 2023-05-20 DIAGNOSIS — Z992 Dependence on renal dialysis: Secondary | ICD-10-CM | POA: Diagnosis not present

## 2023-05-20 DIAGNOSIS — N138 Other obstructive and reflux uropathy: Secondary | ICD-10-CM

## 2023-05-20 DIAGNOSIS — R972 Elevated prostate specific antigen [PSA]: Secondary | ICD-10-CM

## 2023-05-20 DIAGNOSIS — R319 Hematuria, unspecified: Secondary | ICD-10-CM

## 2023-05-20 DIAGNOSIS — N529 Male erectile dysfunction, unspecified: Secondary | ICD-10-CM

## 2023-05-21 DIAGNOSIS — N186 End stage renal disease: Secondary | ICD-10-CM | POA: Diagnosis not present

## 2023-05-21 DIAGNOSIS — D509 Iron deficiency anemia, unspecified: Secondary | ICD-10-CM | POA: Diagnosis not present

## 2023-05-21 DIAGNOSIS — N2581 Secondary hyperparathyroidism of renal origin: Secondary | ICD-10-CM | POA: Diagnosis not present

## 2023-05-21 DIAGNOSIS — N250 Renal osteodystrophy: Secondary | ICD-10-CM | POA: Diagnosis not present

## 2023-05-21 DIAGNOSIS — D631 Anemia in chronic kidney disease: Secondary | ICD-10-CM | POA: Diagnosis not present

## 2023-05-21 DIAGNOSIS — Z992 Dependence on renal dialysis: Secondary | ICD-10-CM | POA: Diagnosis not present

## 2023-05-23 ENCOUNTER — Ambulatory Visit: Admitting: Nurse Practitioner

## 2023-05-23 ENCOUNTER — Encounter: Payer: Self-pay | Admitting: Nurse Practitioner

## 2023-05-23 VITALS — BP 161/76 | HR 62 | Temp 97.8°F | Ht 66.0 in | Wt 183.6 lb

## 2023-05-23 DIAGNOSIS — R0989 Other specified symptoms and signs involving the circulatory and respiratory systems: Secondary | ICD-10-CM | POA: Diagnosis not present

## 2023-05-23 DIAGNOSIS — R141 Gas pain: Secondary | ICD-10-CM

## 2023-05-23 DIAGNOSIS — M10371 Gout due to renal impairment, right ankle and foot: Secondary | ICD-10-CM

## 2023-05-23 MED ORDER — COLCHICINE 0.6 MG PO TABS: 0.6000 mg | ORAL_TABLET | Freq: Two times a day (BID) | ORAL | 0 refills | Status: DC

## 2023-05-23 NOTE — Assessment & Plan Note (Signed)
 Will treat with Colchicine.  Side effects and benefits of medication discussed.  Discussed this may be related to arthritis vs Gout.  Follow up if not improved.

## 2023-05-23 NOTE — Progress Notes (Signed)
 BP (!) 161/76 (BP Location: Left Arm, Cuff Size: Normal)   Pulse 62   Temp 97.8 F (36.6 C) (Oral)   Ht 5\' 6"  (1.676 m)   Wt 183 lb 9.6 oz (83.3 kg)   BMI 29.63 kg/m    Subjective:    Patient ID: Luis Balsam., male    DOB: Jun 05, 1945, 78 y.o.   MRN: [id number]  HPI: Luis Ledbetter. is a 78 y.o. male  Chief Complaint  Patient presents with   Gas    Patient said he have gas all the time and patient states his hands twitch all the time.   Nasal Congestion    Patient said the Zytrec isn't working his nose still running   Patient states he continues to have a runny nose.  He continues to take zyrtec but it isn't helping.    Patient states he is having a lot of gas.  He has to take Lactulose to help with constipation.  On a fluid restriction.    Patient states he thinks he has gout in his right foot.  States he has a lot of pain in the big toe.     Relevant past medical, surgical, family and social history reviewed and updated as indicated. Interim medical history since our last visit reviewed. Allergies and medications reviewed and updated.  Review of Systems  HENT:  Positive for rhinorrhea.   Gastrointestinal:  Positive for abdominal pain.  Musculoskeletal:        Pain in great toe    Per HPI unless specifically indicated above     Objective:    BP (!) 161/76 (BP Location: Left Arm, Cuff Size: Normal)   Pulse 62   Temp 97.8 F (36.6 C) (Oral)   Ht 5\' 6"  (1.676 m)   Wt 183 lb 9.6 oz (83.3 kg)   BMI 29.63 kg/m   Wt Readings from Last 3 Encounters:  05/23/23 183 lb 9.6 oz (83.3 kg)  05/02/23 185 lb 3.2 oz (84 kg)  02/07/23 178 lb 9.6 oz (81 kg)    Physical Exam Vitals and nursing note reviewed.  Constitutional:      General: He is not in acute distress.    Appearance: Normal appearance. He is not ill-appearing, toxic-appearing or diaphoretic.  HENT:     Head: Normocephalic.     Right Ear: External ear normal.     Left Ear: External ear normal.      Nose: Nose normal. No congestion or rhinorrhea.     Mouth/Throat:     Mouth: Mucous membranes are moist.  Eyes:     General:        Right eye: No discharge.        Left eye: No discharge.     Extraocular Movements: Extraocular movements intact.     Conjunctiva/sclera: Conjunctivae normal.     Pupils: Pupils are equal, round, and reactive to light.  Cardiovascular:     Rate and Rhythm: Normal rate and regular rhythm.     Heart sounds: No murmur heard. Pulmonary:     Effort: Pulmonary effort is normal. No respiratory distress.     Breath sounds: Normal breath sounds. No wheezing, rhonchi or rales.  Abdominal:     General: Abdomen is flat. Bowel sounds are normal.  Musculoskeletal:     Cervical back: Normal range of motion and neck supple.     Left foot: Normal range of motion. No deformity, bunion, Charcot foot, foot drop or prominent  metatarsal heads.  Feet:     Left foot:     Skin integrity: Skin integrity normal.     Comments: Foot exam normal.  No evidence of swelling, redness or pain with palpation. Skin:    General: Skin is warm and dry.     Capillary Refill: Capillary refill takes less than 2 seconds.  Neurological:     General: No focal deficit present.     Mental Status: He is alert and oriented to person, place, and time.  Psychiatric:        Mood and Affect: Mood normal.        Behavior: Behavior normal.        Thought Content: Thought content normal.        Judgment: Judgment normal.     Results for orders placed or performed during the hospital encounter of 05/17/23  Lithium level   Collection Time: 05/17/23  9:45 AM  Result Value Ref Range   Lithium Lvl 0.64 0.60 - 1.20 mmol/L      Assessment & Plan:   Problem List Items Addressed This Visit       Other   Gout   Will treat with Colchicine.  Side effects and benefits of medication discussed.  Discussed this may be related to arthritis vs Gout.  Follow up if not improved.       Gas pain   Could be a  side effect of Lactulose.  Patient also has a fluid restriction.  Recommend using Gas x PRN for pain.  Follow up if not improved.        Other Visit Diagnoses       Runny nose    -  Primary   Referral placed for Allergy.  Continue wtih Zyrtec and flonase.   Relevant Orders   Ambulatory referral to Allergy        Follow up plan: No follow-ups on file.

## 2023-05-23 NOTE — Assessment & Plan Note (Signed)
 Could be a side effect of Lactulose.  Patient also has a fluid restriction.  Recommend using Gas x PRN for pain.  Follow up if not improved.

## 2023-05-24 DIAGNOSIS — N2581 Secondary hyperparathyroidism of renal origin: Secondary | ICD-10-CM | POA: Diagnosis not present

## 2023-05-24 DIAGNOSIS — N186 End stage renal disease: Secondary | ICD-10-CM | POA: Diagnosis not present

## 2023-05-24 DIAGNOSIS — N250 Renal osteodystrophy: Secondary | ICD-10-CM | POA: Diagnosis not present

## 2023-05-24 DIAGNOSIS — D631 Anemia in chronic kidney disease: Secondary | ICD-10-CM | POA: Diagnosis not present

## 2023-05-24 DIAGNOSIS — Z992 Dependence on renal dialysis: Secondary | ICD-10-CM | POA: Diagnosis not present

## 2023-05-24 DIAGNOSIS — D509 Iron deficiency anemia, unspecified: Secondary | ICD-10-CM | POA: Diagnosis not present
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
## 2023-05-25 ENCOUNTER — Other Ambulatory Visit
Admission: RE | Admit: 2023-05-25 | Discharge: 2023-05-25 | Disposition: A | Discharge status: Home / Self Care | Source: Ambulatory Visit | Attending: Nephrology | Admitting: Nephrology

## 2023-05-25 ENCOUNTER — Ambulatory Visit (INDEPENDENT_AMBULATORY_CARE_PROVIDER_SITE_OTHER): Payer: Medicare Other | Admitting: Urology

## 2023-05-25 ENCOUNTER — Encounter: Payer: Self-pay | Admitting: Urology

## 2023-05-25 VITALS — BP 148/81 | HR 76

## 2023-05-25 DIAGNOSIS — N401 Enlarged prostate with lower urinary tract symptoms: Secondary | ICD-10-CM

## 2023-05-25 DIAGNOSIS — R310 Gross hematuria: Secondary | ICD-10-CM | POA: Diagnosis not present

## 2023-05-25 DIAGNOSIS — R319 Hematuria, unspecified: Secondary | ICD-10-CM | POA: Diagnosis not present

## 2023-05-25 DIAGNOSIS — R7889 Finding of other specified substances, not normally found in blood: Secondary | ICD-10-CM | POA: Diagnosis not present

## 2023-05-25 DIAGNOSIS — R972 Elevated prostate specific antigen [PSA]: Secondary | ICD-10-CM | POA: Diagnosis not present

## 2023-05-25 DIAGNOSIS — N529 Male erectile dysfunction, unspecified: Secondary | ICD-10-CM

## 2023-05-25 DIAGNOSIS — N138 Other obstructive and reflux uropathy: Secondary | ICD-10-CM | POA: Diagnosis not present

## 2023-05-25 LAB — LITHIUM LEVEL: Lithium Lvl: 0.65 mmol/L (ref 0.60–1.20)

## 2023-05-25 LAB — BLADDER SCAN AMB NON-IMAGING: Scan Result: 34

## 2023-05-26 DIAGNOSIS — N250 Renal osteodystrophy: Secondary | ICD-10-CM | POA: Diagnosis not present

## 2023-05-26 DIAGNOSIS — N186 End stage renal disease: Secondary | ICD-10-CM | POA: Diagnosis not present

## 2023-05-26 DIAGNOSIS — D631 Anemia in chronic kidney disease: Secondary | ICD-10-CM | POA: Diagnosis not present

## 2023-05-26 DIAGNOSIS — Z992 Dependence on renal dialysis: Secondary | ICD-10-CM | POA: Diagnosis not present

## 2023-05-26 DIAGNOSIS — N2581 Secondary hyperparathyroidism of renal origin: Secondary | ICD-10-CM | POA: Diagnosis not present

## 2023-05-26 DIAGNOSIS — D509 Iron deficiency anemia, unspecified: Secondary | ICD-10-CM | POA: Diagnosis not present

## 2023-05-26 LAB — FPSA% REFLEX
% FREE PSA: 29.9 %
PSA, FREE: 2.45 ng/mL

## 2023-05-26 LAB — URINALYSIS, COMPLETE
Bilirubin, UA: NEGATIVE
Glucose, UA: NEGATIVE
Ketones, UA: NEGATIVE
Nitrite, UA: NEGATIVE
Specific Gravity, UA: 1.02 (ref 1.005–1.030)
Urobilinogen, Ur: 0.2 mg/dL (ref 0.2–1.0)
pH, UA: 7 (ref 5.0–7.5)

## 2023-05-26 LAB — MICROSCOPIC EXAMINATION

## 2023-05-26 LAB — PSA TOTAL (REFLEX TO FREE): Prostate Specific Ag, Serum: 8.2 ng/mL — ABNORMAL HIGH (ref 0.0–4.0)

## 2023-05-28 DIAGNOSIS — N2581 Secondary hyperparathyroidism of renal origin: Secondary | ICD-10-CM | POA: Diagnosis not present

## 2023-05-28 DIAGNOSIS — N186 End stage renal disease: Secondary | ICD-10-CM | POA: Diagnosis not present

## 2023-05-28 DIAGNOSIS — Z992 Dependence on renal dialysis: Secondary | ICD-10-CM | POA: Diagnosis not present

## 2023-05-28 DIAGNOSIS — D509 Iron deficiency anemia, unspecified: Secondary | ICD-10-CM | POA: Diagnosis not present

## 2023-05-28 DIAGNOSIS — D631 Anemia in chronic kidney disease: Secondary | ICD-10-CM | POA: Diagnosis not present

## 2023-05-28 DIAGNOSIS — N250 Renal osteodystrophy: Secondary | ICD-10-CM | POA: Diagnosis not present

## 2023-05-31 DIAGNOSIS — N250 Renal osteodystrophy: Secondary | ICD-10-CM | POA: Diagnosis not present

## 2023-05-31 DIAGNOSIS — N186 End stage renal disease: Secondary | ICD-10-CM | POA: Diagnosis not present

## 2023-05-31 DIAGNOSIS — N2581 Secondary hyperparathyroidism of renal origin: Secondary | ICD-10-CM | POA: Diagnosis not present

## 2023-05-31 DIAGNOSIS — D631 Anemia in chronic kidney disease: Secondary | ICD-10-CM | POA: Diagnosis not present

## 2023-05-31 DIAGNOSIS — Z992 Dependence on renal dialysis: Secondary | ICD-10-CM | POA: Diagnosis not present

## 2023-05-31 DIAGNOSIS — D509 Iron deficiency anemia, unspecified: Secondary | ICD-10-CM | POA: Diagnosis not present

## 2023-06-01 DIAGNOSIS — M25461 Effusion, right knee: Secondary | ICD-10-CM | POA: Diagnosis not present

## 2023-06-01 DIAGNOSIS — M25561 Pain in right knee: Secondary | ICD-10-CM | POA: Diagnosis not present

## 2023-06-01 DIAGNOSIS — G8929 Other chronic pain: Secondary | ICD-10-CM | POA: Diagnosis not present

## 2023-06-01 DIAGNOSIS — M1711 Unilateral primary osteoarthritis, right knee: Secondary | ICD-10-CM | POA: Diagnosis not present

## 2023-06-02 ENCOUNTER — Other Ambulatory Visit
Admission: RE | Admit: 2023-06-02 | Discharge: 2023-06-02 | Disposition: A | Discharge status: Home / Self Care | Source: Ambulatory Visit | Attending: Nephrology | Admitting: Nephrology

## 2023-06-02 ENCOUNTER — Telehealth: Payer: Self-pay | Admitting: Psychiatry

## 2023-06-02 DIAGNOSIS — N2581 Secondary hyperparathyroidism of renal origin: Secondary | ICD-10-CM | POA: Diagnosis not present

## 2023-06-02 DIAGNOSIS — D631 Anemia in chronic kidney disease: Secondary | ICD-10-CM | POA: Diagnosis not present

## 2023-06-02 DIAGNOSIS — N186 End stage renal disease: Secondary | ICD-10-CM | POA: Diagnosis not present

## 2023-06-02 DIAGNOSIS — D509 Iron deficiency anemia, unspecified: Secondary | ICD-10-CM | POA: Diagnosis not present

## 2023-06-02 DIAGNOSIS — Z992 Dependence on renal dialysis: Secondary | ICD-10-CM | POA: Diagnosis not present

## 2023-06-02 DIAGNOSIS — N250 Renal osteodystrophy: Secondary | ICD-10-CM | POA: Diagnosis not present

## 2023-06-02 NOTE — Telephone Encounter (Signed)
 I have reviewed labs-lithium received 05/10/2023-0.65.  Therapeutic.

## 2023-06-04 DIAGNOSIS — D631 Anemia in chronic kidney disease: Secondary | ICD-10-CM | POA: Diagnosis not present

## 2023-06-04 DIAGNOSIS — Z992 Dependence on renal dialysis: Secondary | ICD-10-CM | POA: Diagnosis not present

## 2023-06-04 DIAGNOSIS — N250 Renal osteodystrophy: Secondary | ICD-10-CM | POA: Diagnosis not present

## 2023-06-04 DIAGNOSIS — D509 Iron deficiency anemia, unspecified: Secondary | ICD-10-CM | POA: Diagnosis not present

## 2023-06-04 DIAGNOSIS — N186 End stage renal disease: Secondary | ICD-10-CM | POA: Diagnosis not present

## 2023-06-04 DIAGNOSIS — N2581 Secondary hyperparathyroidism of renal origin: Secondary | ICD-10-CM | POA: Diagnosis not present

## 2023-06-05 ENCOUNTER — Other Ambulatory Visit: Payer: Self-pay | Admitting: Psychiatry

## 2023-06-05 DIAGNOSIS — G4701 Insomnia due to medical condition: Secondary | ICD-10-CM

## 2023-06-06 DIAGNOSIS — E0590 Thyrotoxicosis, unspecified without thyrotoxic crisis or storm: Secondary | ICD-10-CM | POA: Diagnosis not present

## 2023-06-06 NOTE — Telephone Encounter (Signed)
 buPROPion (WELLBUTRIN) 100 MG tablet 90 tablet 0 04/04/2023 --   Sig - Route: TAKE 1 TABLET (100 MG TOTAL) BY MOUTH IN THE MORNING - Oral   Sent to pharmacy as: buPROPion (WELLBUTRIN) 100 MG tablet   Notes to Pharmacy: F31.6   E-Prescribing Status: Receipt confirmed by pharmacy (04/04/2023  8:33 AM EST)     I sent Bupropion 100 mg to pharmacy on 04/04/2023- 90 days .  Will have CMA contact pharmacy to clarify the issue .

## 2023-06-07 ENCOUNTER — Telehealth: Payer: Self-pay | Admitting: Psychiatry

## 2023-06-07 ENCOUNTER — Other Ambulatory Visit
Admission: RE | Admit: 2023-06-07 | Discharge: 2023-06-07 | Disposition: A | Discharge status: Home / Self Care | Source: Ambulatory Visit | Attending: Nephrology | Admitting: Nephrology

## 2023-06-07 DIAGNOSIS — D509 Iron deficiency anemia, unspecified: Secondary | ICD-10-CM | POA: Diagnosis not present

## 2023-06-07 DIAGNOSIS — D631 Anemia in chronic kidney disease: Secondary | ICD-10-CM | POA: Diagnosis not present

## 2023-06-07 DIAGNOSIS — R7889 Finding of other specified substances, not normally found in blood: Secondary | ICD-10-CM | POA: Diagnosis not present

## 2023-06-07 DIAGNOSIS — N2581 Secondary hyperparathyroidism of renal origin: Secondary | ICD-10-CM | POA: Diagnosis not present

## 2023-06-07 DIAGNOSIS — Z992 Dependence on renal dialysis: Secondary | ICD-10-CM | POA: Diagnosis not present

## 2023-06-07 DIAGNOSIS — N250 Renal osteodystrophy: Secondary | ICD-10-CM | POA: Diagnosis not present

## 2023-06-07 DIAGNOSIS — N186 End stage renal disease: Secondary | ICD-10-CM | POA: Diagnosis not present

## 2023-06-07 LAB — LITHIUM LEVEL: Lithium Lvl: 0.72 mmol/L (ref 0.60–1.20)

## 2023-06-07 NOTE — Telephone Encounter (Signed)
 Note      06/06/23  8:25 AM PT called this morning and said he went to pick up Wellbutrin and it was the wrong dosage. I see in his chart he takes 100mg  but he was given 150mg . He is also out of Lunesta as well. PT has a follow up with you for 06/29/23 and still uses the same pharmacy on file. Thanks so much    This above message I received yesterday regarding patient's Wellbutrin.  I had sent a message to CMA regarding this yesterday.  Since patient has called back, will send another phone message to CMA to call pharmacy to verify his Wellbutrin medication since I did not send the 150 mg and per review of EHR , I had sent the Wellbutrin 100 mg which is his dosage for 90 days supply.   His Lunesta was already sent to pharmacy yesterday.

## 2023-06-08 ENCOUNTER — Other Ambulatory Visit: Payer: Self-pay | Admitting: Psychiatry

## 2023-06-08 DIAGNOSIS — F3176 Bipolar disorder, in full remission, most recent episode depressed: Secondary | ICD-10-CM

## 2023-06-08 NOTE — Telephone Encounter (Signed)
 per dr. Elna Breslow- Attaching CMA to contact pharmacy to clarify Wellbutrin medication which was sent out by [place] in January and it was 100 mg daily.   I called pharmacy and spoke with  Reita May from the pharmacy called back she stated that the patient last got the 100mg  on  1-13 for 90 day supply and then on 3-15 they refilled a rx for 150mg .   Than dr. Elna Breslow asked   Please contact patient and let him know if he filled his wellbutrin /Bupropion 100 mg on 04/04/2023 - for 90 days supply , he should not be due for it until 07/03/2023.   Please also let him know it looks like pharmacy filled a wellbutrin 150 mg , likely from a previous refill left , he needs to stop taking that .   Please ask him to stay on wellbutrin 100 mg daily .     Contacted pt  called pt and notified him not to take the 150mg . I also told him that he should have enough of the 100mg  of the wellbutrin to do until his next appt with dr. Elna Breslow. i asked him if he could count and see how many he had he said about 20. pt was told again to not take the 150mg .   To only take the 100mg .  Also reminded him of his appt set up for 06-29-23 @ 10:30

## 2023-06-08 NOTE — Telephone Encounter (Signed)
 Queenie from the pharmacy called back she stated that the patient last got the 100mg  on 1-13 for 90 day supply and then on 3-15 they refilled a rx for 150mg .

## 2023-06-08 NOTE — Telephone Encounter (Signed)
 tried to call pt but no answer and mailbox is full

## 2023-06-08 NOTE — Telephone Encounter (Signed)
 called pt and notified him not to take the 150mg . I also told him that he should have enough of the 100mg  of the wellbutrin to do until his next appt with dr. Elna Breslow. i asked him if he could count and see how many he had he said about 20. pt was told again to not take the 150mg .   To only take the 100mg .  Also reminded him of his appt set up for 06-29-23 @ 10:30

## 2023-06-08 NOTE — Telephone Encounter (Signed)
 Please contact patient and let him know if he filled his wellbutrin /Bupropion 100 mg on 04/04/2023 - for 90 days supply , he should not be due for it until 07/03/2023.  Please also let him know it looks like pharmacy filled a wellbutrin 150 mg , likely from a previous refill left , he needs to stop taking that .  Please ask him to stay on wellbutrin 100 mg daily .

## 2023-06-09 DIAGNOSIS — D509 Iron deficiency anemia, unspecified: Secondary | ICD-10-CM | POA: Diagnosis not present

## 2023-06-09 DIAGNOSIS — Z992 Dependence on renal dialysis: Secondary | ICD-10-CM | POA: Diagnosis not present

## 2023-06-09 DIAGNOSIS — D631 Anemia in chronic kidney disease: Secondary | ICD-10-CM | POA: Diagnosis not present

## 2023-06-09 DIAGNOSIS — N186 End stage renal disease: Secondary | ICD-10-CM | POA: Diagnosis not present

## 2023-06-09 DIAGNOSIS — N2581 Secondary hyperparathyroidism of renal origin: Secondary | ICD-10-CM | POA: Diagnosis not present

## 2023-06-09 DIAGNOSIS — N250 Renal osteodystrophy: Secondary | ICD-10-CM | POA: Diagnosis not present

## 2023-06-11 DIAGNOSIS — N2581 Secondary hyperparathyroidism of renal origin: Secondary | ICD-10-CM | POA: Diagnosis not present

## 2023-06-11 DIAGNOSIS — D509 Iron deficiency anemia, unspecified: Secondary | ICD-10-CM | POA: Diagnosis not present

## 2023-06-11 DIAGNOSIS — D631 Anemia in chronic kidney disease: Secondary | ICD-10-CM | POA: Diagnosis not present

## 2023-06-11 DIAGNOSIS — N186 End stage renal disease: Secondary | ICD-10-CM | POA: Diagnosis not present

## 2023-06-11 DIAGNOSIS — Z992 Dependence on renal dialysis: Secondary | ICD-10-CM | POA: Diagnosis not present

## 2023-06-11 DIAGNOSIS — N250 Renal osteodystrophy: Secondary | ICD-10-CM | POA: Diagnosis not present

## 2023-06-13 DIAGNOSIS — E0590 Thyrotoxicosis, unspecified without thyrotoxic crisis or storm: Secondary | ICD-10-CM | POA: Diagnosis not present

## 2023-06-14 ENCOUNTER — Other Ambulatory Visit
Admission: RE | Admit: 2023-06-14 | Discharge: 2023-06-14 | Disposition: A | Discharge status: Home / Self Care | Source: Ambulatory Visit | Attending: Nephrology | Admitting: Nephrology

## 2023-06-14 DIAGNOSIS — D509 Iron deficiency anemia, unspecified: Secondary | ICD-10-CM | POA: Diagnosis not present

## 2023-06-14 DIAGNOSIS — N250 Renal osteodystrophy: Secondary | ICD-10-CM | POA: Diagnosis not present

## 2023-06-14 DIAGNOSIS — N2581 Secondary hyperparathyroidism of renal origin: Secondary | ICD-10-CM | POA: Diagnosis not present

## 2023-06-14 DIAGNOSIS — Z992 Dependence on renal dialysis: Secondary | ICD-10-CM | POA: Diagnosis not present

## 2023-06-14 DIAGNOSIS — R7889 Finding of other specified substances, not normally found in blood: Secondary | ICD-10-CM | POA: Insufficient documentation

## 2023-06-14 DIAGNOSIS — N186 End stage renal disease: Secondary | ICD-10-CM | POA: Diagnosis not present

## 2023-06-14 DIAGNOSIS — D631 Anemia in chronic kidney disease: Secondary | ICD-10-CM | POA: Diagnosis not present

## 2023-06-14 LAB — LITHIUM LEVEL: Lithium Lvl: 0.69 mmol/L (ref 0.60–1.20)

## 2023-06-16 DIAGNOSIS — Z992 Dependence on renal dialysis: Secondary | ICD-10-CM | POA: Diagnosis not present

## 2023-06-16 DIAGNOSIS — N2581 Secondary hyperparathyroidism of renal origin: Secondary | ICD-10-CM | POA: Diagnosis not present

## 2023-06-16 DIAGNOSIS — N186 End stage renal disease: Secondary | ICD-10-CM | POA: Diagnosis not present

## 2023-06-16 DIAGNOSIS — D509 Iron deficiency anemia, unspecified: Secondary | ICD-10-CM | POA: Diagnosis not present

## 2023-06-16 DIAGNOSIS — N250 Renal osteodystrophy: Secondary | ICD-10-CM | POA: Diagnosis not present

## 2023-06-16 DIAGNOSIS — D631 Anemia in chronic kidney disease: Secondary | ICD-10-CM | POA: Diagnosis not present

## 2023-06-18 DIAGNOSIS — Z992 Dependence on renal dialysis: Secondary | ICD-10-CM | POA: Diagnosis not present

## 2023-06-18 DIAGNOSIS — N250 Renal osteodystrophy: Secondary | ICD-10-CM | POA: Diagnosis not present

## 2023-06-18 DIAGNOSIS — N2581 Secondary hyperparathyroidism of renal origin: Secondary | ICD-10-CM | POA: Diagnosis not present

## 2023-06-18 DIAGNOSIS — D509 Iron deficiency anemia, unspecified: Secondary | ICD-10-CM | POA: Diagnosis not present

## 2023-06-18 DIAGNOSIS — N186 End stage renal disease: Secondary | ICD-10-CM | POA: Diagnosis not present

## 2023-06-18 DIAGNOSIS — D631 Anemia in chronic kidney disease: Secondary | ICD-10-CM | POA: Diagnosis not present

## 2023-06-20 DIAGNOSIS — N186 End stage renal disease: Secondary | ICD-10-CM | POA: Diagnosis not present

## 2023-06-20 DIAGNOSIS — Z992 Dependence on renal dialysis: Secondary | ICD-10-CM | POA: Diagnosis not present

## 2023-06-21 ENCOUNTER — Other Ambulatory Visit
Admission: RE | Admit: 2023-06-21 | Discharge: 2023-06-21 | Disposition: A | Discharge status: Home / Self Care | Source: Ambulatory Visit | Attending: Nephrology | Admitting: Nephrology

## 2023-06-21 DIAGNOSIS — N186 End stage renal disease: Secondary | ICD-10-CM | POA: Diagnosis not present

## 2023-06-21 DIAGNOSIS — D509 Iron deficiency anemia, unspecified: Secondary | ICD-10-CM | POA: Diagnosis not present

## 2023-06-21 DIAGNOSIS — Z992 Dependence on renal dialysis: Secondary | ICD-10-CM | POA: Diagnosis not present

## 2023-06-21 DIAGNOSIS — N2581 Secondary hyperparathyroidism of renal origin: Secondary | ICD-10-CM | POA: Diagnosis not present

## 2023-06-21 DIAGNOSIS — D631 Anemia in chronic kidney disease: Secondary | ICD-10-CM | POA: Diagnosis not present

## 2023-06-21 DIAGNOSIS — R7889 Finding of other specified substances, not normally found in blood: Secondary | ICD-10-CM | POA: Insufficient documentation

## 2023-06-21 LAB — LITHIUM LEVEL: Lithium Lvl: 0.76 mmol/L (ref 0.60–1.20)

## 2023-06-23 DIAGNOSIS — N186 End stage renal disease: Secondary | ICD-10-CM | POA: Diagnosis not present

## 2023-06-23 DIAGNOSIS — D509 Iron deficiency anemia, unspecified: Secondary | ICD-10-CM | POA: Diagnosis not present

## 2023-06-23 DIAGNOSIS — N2581 Secondary hyperparathyroidism of renal origin: Secondary | ICD-10-CM | POA: Diagnosis not present

## 2023-06-23 DIAGNOSIS — D631 Anemia in chronic kidney disease: Secondary | ICD-10-CM | POA: Diagnosis not present

## 2023-06-23 DIAGNOSIS — Z992 Dependence on renal dialysis: Secondary | ICD-10-CM | POA: Diagnosis not present

## 2023-06-25 DIAGNOSIS — N2581 Secondary hyperparathyroidism of renal origin: Secondary | ICD-10-CM | POA: Diagnosis not present

## 2023-06-25 DIAGNOSIS — N186 End stage renal disease: Secondary | ICD-10-CM | POA: Diagnosis not present

## 2023-06-25 DIAGNOSIS — Z992 Dependence on renal dialysis: Secondary | ICD-10-CM | POA: Diagnosis not present

## 2023-06-25 DIAGNOSIS — D631 Anemia in chronic kidney disease: Secondary | ICD-10-CM | POA: Diagnosis not present

## 2023-06-25 DIAGNOSIS — D509 Iron deficiency anemia, unspecified: Secondary | ICD-10-CM | POA: Diagnosis not present

## 2023-06-28 ENCOUNTER — Other Ambulatory Visit
Admission: RE | Admit: 2023-06-28 | Discharge: 2023-06-28 | Disposition: A | Discharge status: Home / Self Care | Source: Ambulatory Visit | Attending: Nephrology | Admitting: Nephrology

## 2023-06-28 DIAGNOSIS — R7889 Finding of other specified substances, not normally found in blood: Secondary | ICD-10-CM | POA: Insufficient documentation

## 2023-06-28 DIAGNOSIS — N2581 Secondary hyperparathyroidism of renal origin: Secondary | ICD-10-CM | POA: Diagnosis not present

## 2023-06-28 DIAGNOSIS — N186 End stage renal disease: Secondary | ICD-10-CM | POA: Diagnosis not present

## 2023-06-28 DIAGNOSIS — D509 Iron deficiency anemia, unspecified: Secondary | ICD-10-CM | POA: Diagnosis not present

## 2023-06-28 DIAGNOSIS — Z992 Dependence on renal dialysis: Secondary | ICD-10-CM | POA: Diagnosis not present

## 2023-06-28 DIAGNOSIS — D631 Anemia in chronic kidney disease: Secondary | ICD-10-CM | POA: Diagnosis not present

## 2023-06-28 LAB — LITHIUM LEVEL: Lithium Lvl: 0.83 mmol/L (ref 0.60–1.20)

## 2023-06-29 ENCOUNTER — Encounter: Payer: Self-pay | Admitting: Psychiatry

## 2023-06-29 ENCOUNTER — Ambulatory Visit: Payer: Medicare Other | Admitting: Psychiatry

## 2023-06-29 DIAGNOSIS — F3176 Bipolar disorder, in full remission, most recent episode depressed: Secondary | ICD-10-CM | POA: Diagnosis not present

## 2023-06-29 DIAGNOSIS — R259 Unspecified abnormal involuntary movements: Secondary | ICD-10-CM

## 2023-06-29 DIAGNOSIS — G4701 Insomnia due to medical condition: Secondary | ICD-10-CM

## 2023-06-29 DIAGNOSIS — G3184 Mild cognitive impairment, so stated: Secondary | ICD-10-CM

## 2023-06-29 MED ORDER — ESZOPICLONE 1 MG PO TABS
1.0000 mg | ORAL_TABLET | Freq: Every evening | ORAL | 2 refills | Status: DC | PRN
Start: 2023-06-29 — End: 2023-08-29
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
## 2023-06-30 DIAGNOSIS — D631 Anemia in chronic kidney disease: Secondary | ICD-10-CM | POA: Diagnosis not present

## 2023-06-30 DIAGNOSIS — N2581 Secondary hyperparathyroidism of renal origin: Secondary | ICD-10-CM | POA: Diagnosis not present

## 2023-06-30 DIAGNOSIS — N186 End stage renal disease: Secondary | ICD-10-CM | POA: Diagnosis not present

## 2023-06-30 DIAGNOSIS — D509 Iron deficiency anemia, unspecified: Secondary | ICD-10-CM | POA: Diagnosis not present

## 2023-06-30 DIAGNOSIS — Z992 Dependence on renal dialysis: Secondary | ICD-10-CM | POA: Diagnosis not present

## 2023-07-02 DIAGNOSIS — N186 End stage renal disease: Secondary | ICD-10-CM | POA: Diagnosis not present

## 2023-07-02 DIAGNOSIS — N2581 Secondary hyperparathyroidism of renal origin: Secondary | ICD-10-CM | POA: Diagnosis not present

## 2023-07-02 DIAGNOSIS — D631 Anemia in chronic kidney disease: Secondary | ICD-10-CM | POA: Diagnosis not present

## 2023-07-02 DIAGNOSIS — D509 Iron deficiency anemia, unspecified: Secondary | ICD-10-CM | POA: Diagnosis not present

## 2023-07-02 DIAGNOSIS — Z992 Dependence on renal dialysis: Secondary | ICD-10-CM | POA: Diagnosis not present

## 2023-07-05 DIAGNOSIS — N186 End stage renal disease: Secondary | ICD-10-CM | POA: Diagnosis not present

## 2023-07-05 DIAGNOSIS — D509 Iron deficiency anemia, unspecified: Secondary | ICD-10-CM | POA: Diagnosis not present

## 2023-07-05 DIAGNOSIS — N2581 Secondary hyperparathyroidism of renal origin: Secondary | ICD-10-CM | POA: Diagnosis not present

## 2023-07-05 DIAGNOSIS — D631 Anemia in chronic kidney disease: Secondary | ICD-10-CM | POA: Diagnosis not present

## 2023-07-05 DIAGNOSIS — Z992 Dependence on renal dialysis: Secondary | ICD-10-CM | POA: Diagnosis not present

## 2023-07-07 DIAGNOSIS — D631 Anemia in chronic kidney disease: Secondary | ICD-10-CM | POA: Diagnosis not present

## 2023-07-07 DIAGNOSIS — D509 Iron deficiency anemia, unspecified: Secondary | ICD-10-CM | POA: Diagnosis not present

## 2023-07-07 DIAGNOSIS — N186 End stage renal disease: Secondary | ICD-10-CM | POA: Diagnosis not present

## 2023-07-07 DIAGNOSIS — N2581 Secondary hyperparathyroidism of renal origin: Secondary | ICD-10-CM | POA: Diagnosis not present

## 2023-07-07 DIAGNOSIS — Z992 Dependence on renal dialysis: Secondary | ICD-10-CM | POA: Diagnosis not present

## 2023-07-09 DIAGNOSIS — Z992 Dependence on renal dialysis: Secondary | ICD-10-CM | POA: Diagnosis not present

## 2023-07-09 DIAGNOSIS — N2581 Secondary hyperparathyroidism of renal origin: Secondary | ICD-10-CM | POA: Diagnosis not present

## 2023-07-09 DIAGNOSIS — D509 Iron deficiency anemia, unspecified: Secondary | ICD-10-CM | POA: Diagnosis not present

## 2023-07-09 DIAGNOSIS — N186 End stage renal disease: Secondary | ICD-10-CM | POA: Diagnosis not present

## 2023-07-09 DIAGNOSIS — D631 Anemia in chronic kidney disease: Secondary | ICD-10-CM | POA: Diagnosis not present

## 2023-07-12 DIAGNOSIS — Z992 Dependence on renal dialysis: Secondary | ICD-10-CM | POA: Diagnosis not present

## 2023-07-12 DIAGNOSIS — N2581 Secondary hyperparathyroidism of renal origin: Secondary | ICD-10-CM | POA: Diagnosis not present

## 2023-07-12 DIAGNOSIS — N186 End stage renal disease: Secondary | ICD-10-CM | POA: Diagnosis not present

## 2023-07-12 DIAGNOSIS — D631 Anemia in chronic kidney disease: Secondary | ICD-10-CM | POA: Diagnosis not present

## 2023-07-12 DIAGNOSIS — D509 Iron deficiency anemia, unspecified: Secondary | ICD-10-CM | POA: Diagnosis not present

## 2023-07-14 DIAGNOSIS — Z992 Dependence on renal dialysis: Secondary | ICD-10-CM | POA: Diagnosis not present

## 2023-07-14 DIAGNOSIS — N2581 Secondary hyperparathyroidism of renal origin: Secondary | ICD-10-CM | POA: Diagnosis not present

## 2023-07-14 DIAGNOSIS — D509 Iron deficiency anemia, unspecified: Secondary | ICD-10-CM | POA: Diagnosis not present

## 2023-07-14 DIAGNOSIS — N186 End stage renal disease: Secondary | ICD-10-CM | POA: Diagnosis not present

## 2023-07-14 DIAGNOSIS — D631 Anemia in chronic kidney disease: Secondary | ICD-10-CM | POA: Diagnosis not present

## 2023-07-16 DIAGNOSIS — N2581 Secondary hyperparathyroidism of renal origin: Secondary | ICD-10-CM | POA: Diagnosis not present

## 2023-07-16 DIAGNOSIS — D631 Anemia in chronic kidney disease: Secondary | ICD-10-CM | POA: Diagnosis not present

## 2023-07-16 DIAGNOSIS — N186 End stage renal disease: Secondary | ICD-10-CM | POA: Diagnosis not present

## 2023-07-16 DIAGNOSIS — Z992 Dependence on renal dialysis: Secondary | ICD-10-CM | POA: Diagnosis not present

## 2023-07-16 DIAGNOSIS — D509 Iron deficiency anemia, unspecified: Secondary | ICD-10-CM | POA: Diagnosis not present

## 2023-07-19 DIAGNOSIS — N2581 Secondary hyperparathyroidism of renal origin: Secondary | ICD-10-CM | POA: Diagnosis not present

## 2023-07-19 DIAGNOSIS — N186 End stage renal disease: Secondary | ICD-10-CM | POA: Diagnosis not present

## 2023-07-19 DIAGNOSIS — Z992 Dependence on renal dialysis: Secondary | ICD-10-CM | POA: Diagnosis not present

## 2023-07-19 DIAGNOSIS — D509 Iron deficiency anemia, unspecified: Secondary | ICD-10-CM | POA: Diagnosis not present

## 2023-07-19 DIAGNOSIS — D631 Anemia in chronic kidney disease: Secondary | ICD-10-CM | POA: Diagnosis not present

## 2023-07-20 ENCOUNTER — Other Ambulatory Visit: Payer: Self-pay | Admitting: Nurse Practitioner

## 2023-07-20 DIAGNOSIS — E782 Mixed hyperlipidemia: Secondary | ICD-10-CM

## 2023-07-20 DIAGNOSIS — N186 End stage renal disease: Secondary | ICD-10-CM | POA: Diagnosis not present

## 2023-07-20 DIAGNOSIS — Z992 Dependence on renal dialysis: Secondary | ICD-10-CM | POA: Diagnosis not present

## 2023-07-21 DIAGNOSIS — N186 End stage renal disease: Secondary | ICD-10-CM | POA: Diagnosis not present

## 2023-07-21 DIAGNOSIS — D631 Anemia in chronic kidney disease: Secondary | ICD-10-CM | POA: Diagnosis not present

## 2023-07-21 DIAGNOSIS — Z992 Dependence on renal dialysis: Secondary | ICD-10-CM | POA: Diagnosis not present

## 2023-07-21 DIAGNOSIS — D509 Iron deficiency anemia, unspecified: Secondary | ICD-10-CM | POA: Diagnosis not present

## 2023-07-21 DIAGNOSIS — N2581 Secondary hyperparathyroidism of renal origin: Secondary | ICD-10-CM | POA: Diagnosis not present

## 2023-07-22 NOTE — Telephone Encounter (Signed)
 Requested Prescriptions  Pending Prescriptions Disp Refills   rosuvastatin  (CRESTOR ) 10 MG tablet [Pharmacy Med Name: ROSUVASTATIN  TABS 10MG ] 90 tablet 0    Sig: TAKE 1 TABLET DAILY     Cardiovascular:  Antilipid - Statins 2 Failed - 07/22/2023  1:45 PM      Failed - Cr in normal range and within 360 days    Creatinine  Date Value Ref Range Status  03/08/2014 2.22 (H) 0.60 - 1.30 mg/dL Final   Creatinine, Ser  Date Value Ref Range Status  02/07/2023 6.72 (H) 0.76 - 1.27 mg/dL Final   Creatinine, Urine  Date Value Ref Range Status  08/01/2019 194 mg/dL Final    Comment:    Performed at [facility], [address] [place], [place] [address]         Failed - Lipid Panel in normal range within the last 12 months    Cholesterol, Total  Date Value Ref Range Status  02/07/2023 124 100 - 199 mg/dL Final   Cholesterol Piccolo, Waived  Date Value Ref Range Status  10/03/2017 180 <200 mg/dL Final    Comment:                            Desirable                <200                         Borderline High      200- 239                         High                     >239    LDL Chol Calc (NIH)  Date Value Ref Range Status  02/07/2023 50 0 - 99 mg/dL Final   HDL  Date Value Ref Range Status  02/07/2023 47 >39 mg/dL Final   Triglycerides  Date Value Ref Range Status  02/07/2023 160 (H) 0 - 149 mg/dL Final   Triglycerides Piccolo,Waived  Date Value Ref Range Status  10/03/2017 252 (H) <150 mg/dL Final    Comment:                            Normal                   <150                         Borderline High     150 - 199                         High                200 - 499                         Very High                >499          Passed - Patient is not pregnant      Passed - Valid encounter within last 12 months    Recent Outpatient Visits  2 months ago Runny nose   [facility] Aileen Alexanders, NP   2 months  ago Seasonal allergies   [facility] Aileen Alexanders, NP       Future Appointments             In 2 weeks Aileen Alexanders, NP [facility]

## 2023-07-23 DIAGNOSIS — D509 Iron deficiency anemia, unspecified: Secondary | ICD-10-CM | POA: Diagnosis not present

## 2023-07-23 DIAGNOSIS — N186 End stage renal disease: Secondary | ICD-10-CM | POA: Diagnosis not present

## 2023-07-23 DIAGNOSIS — D631 Anemia in chronic kidney disease: Secondary | ICD-10-CM | POA: Diagnosis not present

## 2023-07-23 DIAGNOSIS — N2581 Secondary hyperparathyroidism of renal origin: Secondary | ICD-10-CM | POA: Diagnosis not present

## 2023-07-23 DIAGNOSIS — Z992 Dependence on renal dialysis: Secondary | ICD-10-CM | POA: Diagnosis not present

## 2023-07-26 ENCOUNTER — Other Ambulatory Visit
Admission: RE | Admit: 2023-07-26 | Discharge: 2023-07-26 | Disposition: A | Discharge status: Home / Self Care | Source: Ambulatory Visit | Attending: Nephrology | Admitting: Nephrology

## 2023-07-26 DIAGNOSIS — Z992 Dependence on renal dialysis: Secondary | ICD-10-CM | POA: Diagnosis not present

## 2023-07-26 DIAGNOSIS — R7889 Finding of other specified substances, not normally found in blood: Secondary | ICD-10-CM | POA: Diagnosis not present

## 2023-07-26 DIAGNOSIS — N186 End stage renal disease: Secondary | ICD-10-CM | POA: Diagnosis not present

## 2023-07-26 DIAGNOSIS — N2581 Secondary hyperparathyroidism of renal origin: Secondary | ICD-10-CM | POA: Diagnosis not present

## 2023-07-26 DIAGNOSIS — D509 Iron deficiency anemia, unspecified: Secondary | ICD-10-CM | POA: Diagnosis not present

## 2023-07-26 DIAGNOSIS — D631 Anemia in chronic kidney disease: Secondary | ICD-10-CM | POA: Diagnosis not present

## 2023-07-26 LAB — LITHIUM LEVEL: Lithium Lvl: 0.85 mmol/L (ref 0.60–1.20)

## 2023-07-28 ENCOUNTER — Telehealth: Payer: Self-pay | Admitting: Psychiatry

## 2023-07-28 DIAGNOSIS — N186 End stage renal disease: Secondary | ICD-10-CM | POA: Diagnosis not present

## 2023-07-28 DIAGNOSIS — D631 Anemia in chronic kidney disease: Secondary | ICD-10-CM | POA: Diagnosis not present

## 2023-07-28 DIAGNOSIS — N2581 Secondary hyperparathyroidism of renal origin: Secondary | ICD-10-CM | POA: Diagnosis not present

## 2023-07-28 DIAGNOSIS — Z992 Dependence on renal dialysis: Secondary | ICD-10-CM | POA: Diagnosis not present

## 2023-07-28 DIAGNOSIS — D509 Iron deficiency anemia, unspecified: Secondary | ICD-10-CM | POA: Diagnosis not present

## 2023-07-28 NOTE — Telephone Encounter (Signed)
 I have reviewed lithium  level dated 07/26/2023-0.85 therapeutic.  Received from [facility].

## 2023-07-30 DIAGNOSIS — Z992 Dependence on renal dialysis: Secondary | ICD-10-CM | POA: Diagnosis not present

## 2023-07-30 DIAGNOSIS — N186 End stage renal disease: Secondary | ICD-10-CM | POA: Diagnosis not present

## 2023-07-30 DIAGNOSIS — N2581 Secondary hyperparathyroidism of renal origin: Secondary | ICD-10-CM | POA: Diagnosis not present

## 2023-07-30 DIAGNOSIS — D631 Anemia in chronic kidney disease: Secondary | ICD-10-CM | POA: Diagnosis not present

## 2023-07-30 DIAGNOSIS — D509 Iron deficiency anemia, unspecified: Secondary | ICD-10-CM | POA: Diagnosis not present

## 2023-08-02 ENCOUNTER — Other Ambulatory Visit
Admission: RE | Admit: 2023-08-02 | Discharge: 2023-08-02 | Disposition: A | Discharge status: Home / Self Care | Source: Ambulatory Visit | Attending: Nephrology | Admitting: Nephrology

## 2023-08-02 DIAGNOSIS — R7889 Finding of other specified substances, not normally found in blood: Secondary | ICD-10-CM | POA: Insufficient documentation

## 2023-08-02 DIAGNOSIS — D631 Anemia in chronic kidney disease: Secondary | ICD-10-CM | POA: Diagnosis not present

## 2023-08-02 DIAGNOSIS — D509 Iron deficiency anemia, unspecified: Secondary | ICD-10-CM | POA: Diagnosis not present

## 2023-08-02 DIAGNOSIS — N2581 Secondary hyperparathyroidism of renal origin: Secondary | ICD-10-CM | POA: Diagnosis not present

## 2023-08-02 DIAGNOSIS — Z992 Dependence on renal dialysis: Secondary | ICD-10-CM | POA: Diagnosis not present

## 2023-08-02 DIAGNOSIS — N186 End stage renal disease: Secondary | ICD-10-CM | POA: Diagnosis not present

## 2023-08-02 LAB — LITHIUM LEVEL: Lithium Lvl: 0.8 mmol/L (ref 0.60–1.20)

## 2023-08-03 DIAGNOSIS — D0359 Melanoma in situ of other part of trunk: Secondary | ICD-10-CM | POA: Diagnosis not present

## 2023-08-04 DIAGNOSIS — N2581 Secondary hyperparathyroidism of renal origin: Secondary | ICD-10-CM | POA: Diagnosis not present

## 2023-08-04 DIAGNOSIS — N186 End stage renal disease: Secondary | ICD-10-CM | POA: Diagnosis not present

## 2023-08-04 DIAGNOSIS — D631 Anemia in chronic kidney disease: Secondary | ICD-10-CM | POA: Diagnosis not present

## 2023-08-04 DIAGNOSIS — Z992 Dependence on renal dialysis: Secondary | ICD-10-CM | POA: Diagnosis not present

## 2023-08-04 DIAGNOSIS — D509 Iron deficiency anemia, unspecified: Secondary | ICD-10-CM | POA: Diagnosis not present

## 2023-08-06 DIAGNOSIS — N2581 Secondary hyperparathyroidism of renal origin: Secondary | ICD-10-CM | POA: Diagnosis not present

## 2023-08-06 DIAGNOSIS — D631 Anemia in chronic kidney disease: Secondary | ICD-10-CM | POA: Diagnosis not present

## 2023-08-06 DIAGNOSIS — Z992 Dependence on renal dialysis: Secondary | ICD-10-CM | POA: Diagnosis not present

## 2023-08-06 DIAGNOSIS — N186 End stage renal disease: Secondary | ICD-10-CM | POA: Diagnosis not present

## 2023-08-06 DIAGNOSIS — D509 Iron deficiency anemia, unspecified: Secondary | ICD-10-CM | POA: Diagnosis not present

## 2023-08-08 ENCOUNTER — Ambulatory Visit: Payer: Self-pay | Admitting: Nurse Practitioner

## 2023-08-08 ENCOUNTER — Telehealth: Payer: Self-pay | Admitting: Psychiatry

## 2023-08-08 ENCOUNTER — Encounter: Payer: Self-pay | Admitting: Nurse Practitioner

## 2023-08-08 VITALS — BP 145/88 | HR 74 | Temp 97.9°F | Wt 186.0 lb

## 2023-08-08 DIAGNOSIS — Z992 Dependence on renal dialysis: Secondary | ICD-10-CM | POA: Diagnosis not present

## 2023-08-08 DIAGNOSIS — N186 End stage renal disease: Secondary | ICD-10-CM

## 2023-08-08 DIAGNOSIS — I152 Hypertension secondary to endocrine disorders: Secondary | ICD-10-CM

## 2023-08-08 DIAGNOSIS — E1159 Type 2 diabetes mellitus with other circulatory complications: Secondary | ICD-10-CM

## 2023-08-08 DIAGNOSIS — N2581 Secondary hyperparathyroidism of renal origin: Secondary | ICD-10-CM

## 2023-08-08 DIAGNOSIS — E782 Mixed hyperlipidemia: Secondary | ICD-10-CM

## 2023-08-08 DIAGNOSIS — F3176 Bipolar disorder, in full remission, most recent episode depressed: Secondary | ICD-10-CM

## 2023-08-08 MED ORDER — FLUTICASONE PROPIONATE 50 MCG/ACT NA SUSP: 2.0000 | Freq: Every day | NASAL | 6 refills | Status: DC

## 2023-08-08 MED ORDER — ROSUVASTATIN CALCIUM 10 MG PO TABS: 10.0000 mg | ORAL_TABLET | Freq: Every day | ORAL | 1 refills | Status: AC

## 2023-08-08 NOTE — Assessment & Plan Note (Signed)
 Chronic, stable.  Managed by Nephrology.

## 2023-08-08 NOTE — Assessment & Plan Note (Addendum)
 Stable.  Continues to follow up with Psychiatry.  Denies concerns at visit today.  Reviewed recent note with Psychiatry. Recommend following up with psychiatrist regarding sleep concern.  Follow up in 6 months.  Call sooner if concerns arise.

## 2023-08-08 NOTE — Assessment & Plan Note (Signed)
 Chronic.  Controlled without medication. Managed by Nephrology.  Labs ordered today.  Return to clinic in 6 months for reevaluation.  Call sooner if concerns arise.

## 2023-08-08 NOTE — Assessment & Plan Note (Signed)
 Followed by Nephrology.  Continue per specialists recommendations.

## 2023-08-08 NOTE — Telephone Encounter (Signed)
 I have reviewed lithium  level dated 08/02/2023-0.8-therapeutic.

## 2023-08-08 NOTE — Assessment & Plan Note (Signed)
 Chronic.  Controlled.  Continue with current medication regimen.  Labs ordered today.  Return to clinic in 6 months for reevaluation.  Call sooner if concerns arise.  ? ?

## 2023-08-08 NOTE — Progress Notes (Signed)
 BP (!) 145/88   Pulse 74   Temp 97.9 F (36.6 C) (Oral)   Wt 186 lb (84.4 kg)   SpO2 100%   BMI 30.02 kg/m    Subjective:    Patient ID: Luis Miller., male    DOB: 1945/09/28, 78 y.o.   MRN: [id number]  HPI: Luis Miller. is a 78 y.o. male  Chief Complaint  Patient presents with   Hypertension   HYPERTENSION / HYPERLIPIDEMIA No longer taking any blood pressure medications.   Satisfied with current treatment? no Duration of hypertension: years BP monitoring frequency: not checking BP range:  BP medication side effects: no Past BP meds:  Duration of hyperlipidemia: years Cholesterol medication side effects: no Cholesterol supplements: none Past cholesterol medications: rosuvastatin  (crestor ) Medication compliance: excellent compliance Aspirin: no Recent stressors: no Recurrent headaches: no Visual changes: no Palpitations: no Dyspnea: no Chest pain: no Lower extremity edema: no Dizzy/lightheaded: no  CHRONIC KIDNEY DISEASE On Hemodialysis- doing well with HD. CKD status: controlled Medications renally dose: yes Previous renal evaluation: yes Pneumovax:  Up to Date Influenza Vaccine:  Not up to Date  SLEEP APNEA Endorses 100% use. Sleep apnea status: controlled Duration: chronic Satisfied with current treatment?:  yes CPAP use:  yes   Mood has been improving.  He is working with Dr. Eappen on his medications.  He is sleeping better but he is waking up too early in the morning.  He is on Lunesta . States he isn't getting more than 4 hours of sleep per night.      Relevant past medical, surgical, family and social history reviewed and updated as indicated. Interim medical history since our last visit reviewed. Allergies and medications reviewed and updated.  Review of Systems  Eyes:  Negative for visual disturbance.  Respiratory:  Negative for shortness of breath.   Cardiovascular:  Negative for chest pain and leg swelling.  Neurological:   Negative for light-headedness and headaches.  Psychiatric/Behavioral:  Positive for dysphoric mood and sleep disturbance. Negative for suicidal ideas. The patient is not nervous/anxious.     Per HPI unless specifically indicated above     Objective:     BP (!) 145/88   Pulse 74   Temp 97.9 F (36.6 C) (Oral)   Wt 186 lb (84.4 kg)   SpO2 100%   BMI 30.02 kg/m   Wt Readings from Last 3 Encounters:  08/08/23 186 lb (84.4 kg)  05/23/23 183 lb 9.6 oz (83.3 kg)  05/04/23 183 lb 9.6 oz (83.3 kg)    Physical Exam Vitals and nursing note reviewed.  Constitutional:      General: He is not in acute distress.    Appearance: Normal appearance. He is not ill-appearing, toxic-appearing or diaphoretic.  HENT:     Head: Normocephalic.     Right Ear: External ear normal.     Left Ear: External ear normal.     Nose: Nose normal. No congestion or rhinorrhea.     Mouth/Throat:     Mouth: Mucous membranes are moist.  Eyes:     General:        Right eye: No discharge.        Left eye: No discharge.     Extraocular Movements: Extraocular movements intact.     Conjunctiva/sclera: Conjunctivae normal.     Pupils: Pupils are equal, round, and reactive to light.  Cardiovascular:     Rate and Rhythm: Normal rate and regular rhythm.  Heart sounds: No murmur heard. Pulmonary:     Effort: Pulmonary effort is normal. No respiratory distress.     Breath sounds: Normal breath sounds. No wheezing, rhonchi or rales.  Abdominal:     General: Abdomen is flat. Bowel sounds are normal.  Musculoskeletal:     Cervical back: Normal range of motion and neck supple.  Skin:    General: Skin is warm and dry.     Capillary Refill: Capillary refill takes less than 2 seconds.  Neurological:     General: No focal deficit present.     Mental Status: He is alert and oriented to person, place, and time.  Psychiatric:        Mood and Affect: Mood normal.        Behavior: Behavior normal.        Thought  Content: Thought content normal.        Judgment: Judgment normal.     Results for orders placed or performed during the hospital encounter of 08/02/23  Lithium  level   Collection Time: 08/02/23 10:00 AM  Result Value Ref Range   Lithium  Lvl 0.80 0.60 - 1.20 mmol/L      Assessment & Plan:   Problem List Items Addressed This Visit       Cardiovascular and Mediastinum   Hypertension associated with diabetes (HCC)   Chronic.  Controlled without medication. Managed by Nephrology.  Labs ordered today.  Return to clinic in 6 months for reevaluation.  Call sooner if concerns arise.       Relevant Medications   rosuvastatin  (CRESTOR ) 10 MG tablet   Other Relevant Orders   Hemoglobin A1c   Comprehensive metabolic panel with GFR     Endocrine   Secondary hyperparathyroidism of renal origin (HCC)   Chronic, stable.  Managed by Nephrology.        Genitourinary   ESRD on hemodialysis [facility])   Followed by Nephrology.  Continue per specialists recommendations.        Other   Bipolar disorder, in full remission, most recent episode depressed (HCC) - Primary   Stable.  Continues to follow up with Psychiatry.  Denies concerns at visit today.  Reviewed recent note with Psychiatry. Recommend following up with psychiatrist regarding sleep concern.  Follow up in 6 months.  Call sooner if concerns arise.       Hyperlipidemia   Chronic.  Controlled.  Continue with current medication regimen.  Labs ordered today.  Return to clinic in 6 months for reevaluation.  Call sooner if concerns arise.       Relevant Medications   rosuvastatin  (CRESTOR ) 10 MG tablet   Other Relevant Orders   Lipid panel     Follow up plan: Return in about 6 months (around 02/08/2024) for HTN, HLD, DM2 FU.

## 2023-08-09 ENCOUNTER — Ambulatory Visit: Payer: Self-pay | Admitting: Nurse Practitioner

## 2023-08-09 ENCOUNTER — Other Ambulatory Visit
Admission: RE | Admit: 2023-08-09 | Discharge: 2023-08-09 | Disposition: A | Discharge status: Home / Self Care | Source: Ambulatory Visit | Attending: Nephrology | Admitting: Nephrology

## 2023-08-09 ENCOUNTER — Encounter (INDEPENDENT_AMBULATORY_CARE_PROVIDER_SITE_OTHER): Payer: Self-pay

## 2023-08-09 DIAGNOSIS — R7889 Finding of other specified substances, not normally found in blood: Secondary | ICD-10-CM | POA: Insufficient documentation

## 2023-08-09 DIAGNOSIS — D631 Anemia in chronic kidney disease: Secondary | ICD-10-CM | POA: Diagnosis not present

## 2023-08-09 DIAGNOSIS — D509 Iron deficiency anemia, unspecified: Secondary | ICD-10-CM | POA: Diagnosis not present

## 2023-08-09 DIAGNOSIS — N2581 Secondary hyperparathyroidism of renal origin: Secondary | ICD-10-CM | POA: Diagnosis not present

## 2023-08-09 DIAGNOSIS — N186 End stage renal disease: Secondary | ICD-10-CM | POA: Diagnosis not present

## 2023-08-09 DIAGNOSIS — Z992 Dependence on renal dialysis: Secondary | ICD-10-CM | POA: Diagnosis not present

## 2023-08-09 LAB — LIPID PANEL
Chol/HDL Ratio: 2.9 ratio (ref 0.0–5.0)
Cholesterol, Total: 115 mg/dL (ref 100–199)
HDL: 39 mg/dL — ABNORMAL LOW (ref >39)
LDL Chol Calc (NIH): 46 mg/dL (ref 0–99)
Triglycerides: 183 mg/dL — ABNORMAL HIGH (ref 0–149)
VLDL Cholesterol Cal: 30 mg/dL (ref 5–40)

## 2023-08-09 LAB — HEMOGLOBIN A1C
Est. average glucose Bld gHb Est-mCnc: 108 mg/dL
Hgb A1c MFr Bld: 5.4 % (ref 4.8–5.6)

## 2023-08-09 LAB — COMPREHENSIVE METABOLIC PANEL WITH GFR
ALT: 19 IU/L (ref 0–44)
AST: 19 IU/L (ref 0–40)
Albumin: 4.4 g/dL (ref 3.8–4.8)
Alkaline Phosphatase: 83 IU/L (ref 44–121)
BUN/Creatinine Ratio: 7 — ABNORMAL LOW (ref 10–24)
BUN: 55 mg/dL — ABNORMAL HIGH (ref 8–27)
Bilirubin Total: 0.4 mg/dL (ref 0.0–1.2)
CO2: 24 mmol/L (ref 20–29)
Calcium: 9.9 mg/dL (ref 8.6–10.2)
Chloride: 99 mmol/L (ref 96–106)
Creatinine, Ser: 7.41 mg/dL — ABNORMAL HIGH (ref 0.76–1.27)
Globulin, Total: 2.7 g/dL (ref 1.5–4.5)
Glucose: 76 mg/dL (ref 70–99)
Potassium: 3.9 mmol/L (ref 3.5–5.2)
Sodium: 145 mmol/L — ABNORMAL HIGH (ref 134–144)
Total Protein: 7.1 g/dL (ref 6.0–8.5)
eGFR: 7 mL/min/{1.73_m2} — ABNORMAL LOW (ref >59)

## 2023-08-09 LAB — LITHIUM LEVEL: Lithium Lvl: 0.81 mmol/L (ref 0.60–1.20)

## 2023-08-11 ENCOUNTER — Telehealth: Payer: Self-pay | Admitting: Psychiatry

## 2023-08-11 DIAGNOSIS — D509 Iron deficiency anemia, unspecified: Secondary | ICD-10-CM | POA: Diagnosis not present

## 2023-08-11 DIAGNOSIS — Z992 Dependence on renal dialysis: Secondary | ICD-10-CM | POA: Diagnosis not present

## 2023-08-11 DIAGNOSIS — D631 Anemia in chronic kidney disease: Secondary | ICD-10-CM | POA: Diagnosis not present

## 2023-08-11 DIAGNOSIS — N2581 Secondary hyperparathyroidism of renal origin: Secondary | ICD-10-CM | POA: Diagnosis not present

## 2023-08-11 DIAGNOSIS — N186 End stage renal disease: Secondary | ICD-10-CM | POA: Diagnosis not present

## 2023-08-11 NOTE — Telephone Encounter (Signed)
 Lithium  level dated 08/10/2023-0.81-therapeutic.

## 2023-08-13 ENCOUNTER — Other Ambulatory Visit: Payer: Self-pay | Admitting: Psychiatry

## 2023-08-13 DIAGNOSIS — N186 End stage renal disease: Secondary | ICD-10-CM | POA: Diagnosis not present

## 2023-08-13 DIAGNOSIS — G4701 Insomnia due to medical condition: Secondary | ICD-10-CM

## 2023-08-13 DIAGNOSIS — Z992 Dependence on renal dialysis: Secondary | ICD-10-CM | POA: Diagnosis not present

## 2023-08-13 DIAGNOSIS — D631 Anemia in chronic kidney disease: Secondary | ICD-10-CM | POA: Diagnosis not present

## 2023-08-13 DIAGNOSIS — N2581 Secondary hyperparathyroidism of renal origin: Secondary | ICD-10-CM | POA: Diagnosis not present

## 2023-08-13 DIAGNOSIS — D509 Iron deficiency anemia, unspecified: Secondary | ICD-10-CM | POA: Diagnosis not present

## 2023-08-16 ENCOUNTER — Telehealth: Payer: Self-pay | Admitting: Psychiatry

## 2023-08-16 ENCOUNTER — Other Ambulatory Visit
Admission: RE | Admit: 2023-08-16 | Discharge: 2023-08-16 | Disposition: A | Discharge status: Home / Self Care | Source: Ambulatory Visit | Attending: Nephrology | Admitting: Nephrology

## 2023-08-16 DIAGNOSIS — D509 Iron deficiency anemia, unspecified: Secondary | ICD-10-CM | POA: Diagnosis not present

## 2023-08-16 DIAGNOSIS — D631 Anemia in chronic kidney disease: Secondary | ICD-10-CM | POA: Diagnosis not present

## 2023-08-16 DIAGNOSIS — N186 End stage renal disease: Secondary | ICD-10-CM | POA: Diagnosis not present

## 2023-08-16 DIAGNOSIS — R7889 Finding of other specified substances, not normally found in blood: Secondary | ICD-10-CM | POA: Diagnosis not present

## 2023-08-16 DIAGNOSIS — Z992 Dependence on renal dialysis: Secondary | ICD-10-CM | POA: Diagnosis not present

## 2023-08-16 DIAGNOSIS — N2581 Secondary hyperparathyroidism of renal origin: Secondary | ICD-10-CM | POA: Diagnosis not present

## 2023-08-16 LAB — LITHIUM LEVEL: Lithium Lvl: 0.69 mmol/L (ref 0.60–1.20)

## 2023-08-16 NOTE — Telephone Encounter (Signed)
 I have received most recent lithium  level dated 08/16/2023-0.69-therapeutic received from [facility]

## 2023-08-18 DIAGNOSIS — N186 End stage renal disease: Secondary | ICD-10-CM | POA: Diagnosis not present

## 2023-08-18 DIAGNOSIS — N2581 Secondary hyperparathyroidism of renal origin: Secondary | ICD-10-CM | POA: Diagnosis not present

## 2023-08-18 DIAGNOSIS — Z992 Dependence on renal dialysis: Secondary | ICD-10-CM | POA: Diagnosis not present

## 2023-08-18 DIAGNOSIS — D509 Iron deficiency anemia, unspecified: Secondary | ICD-10-CM | POA: Diagnosis not present

## 2023-08-18 DIAGNOSIS — D631 Anemia in chronic kidney disease: Secondary | ICD-10-CM | POA: Diagnosis not present

## 2023-08-20 DIAGNOSIS — N2581 Secondary hyperparathyroidism of renal origin: Secondary | ICD-10-CM | POA: Diagnosis not present

## 2023-08-20 DIAGNOSIS — D509 Iron deficiency anemia, unspecified: Secondary | ICD-10-CM | POA: Diagnosis not present

## 2023-08-20 DIAGNOSIS — Z992 Dependence on renal dialysis: Secondary | ICD-10-CM | POA: Diagnosis not present

## 2023-08-20 DIAGNOSIS — N186 End stage renal disease: Secondary | ICD-10-CM | POA: Diagnosis not present

## 2023-08-20 DIAGNOSIS — D631 Anemia in chronic kidney disease: Secondary | ICD-10-CM | POA: Diagnosis not present

## 2023-08-23 ENCOUNTER — Other Ambulatory Visit
Admission: RE | Admit: 2023-08-23 | Discharge: 2023-08-23 | Disposition: A | Discharge status: Home / Self Care | Source: Other Acute Inpatient Hospital | Attending: Nephrology | Admitting: Nephrology

## 2023-08-23 DIAGNOSIS — D509 Iron deficiency anemia, unspecified: Secondary | ICD-10-CM | POA: Diagnosis not present

## 2023-08-23 DIAGNOSIS — N186 End stage renal disease: Secondary | ICD-10-CM | POA: Diagnosis not present

## 2023-08-23 DIAGNOSIS — D631 Anemia in chronic kidney disease: Secondary | ICD-10-CM | POA: Diagnosis not present

## 2023-08-23 DIAGNOSIS — N250 Renal osteodystrophy: Secondary | ICD-10-CM | POA: Diagnosis not present

## 2023-08-23 DIAGNOSIS — R7889 Finding of other specified substances, not normally found in blood: Secondary | ICD-10-CM | POA: Diagnosis not present

## 2023-08-23 DIAGNOSIS — Z992 Dependence on renal dialysis: Secondary | ICD-10-CM | POA: Diagnosis not present

## 2023-08-23 DIAGNOSIS — N2581 Secondary hyperparathyroidism of renal origin: Secondary | ICD-10-CM | POA: Diagnosis not present

## 2023-08-23 LAB — LITHIUM LEVEL: Lithium Lvl: 0.77 mmol/L (ref 0.60–1.20)

## 2023-08-24 ENCOUNTER — Ambulatory Visit

## 2023-08-24 ENCOUNTER — Ambulatory Visit (INDEPENDENT_AMBULATORY_CARE_PROVIDER_SITE_OTHER)

## 2023-08-24 DIAGNOSIS — Z23 Encounter for immunization: Secondary | ICD-10-CM | POA: Diagnosis not present

## 2023-08-24 NOTE — Progress Notes (Signed)
 Patient is in office today for a nurse visit for Covid Vaccine. Patient Injection was given in the  Right deltoid. Patient tolerated injection well.

## 2023-08-25 ENCOUNTER — Ambulatory Visit: Admitting: Psychiatry

## 2023-08-25 ENCOUNTER — Telehealth: Payer: Self-pay | Admitting: Psychiatry

## 2023-08-25 DIAGNOSIS — N250 Renal osteodystrophy: Secondary | ICD-10-CM | POA: Diagnosis not present

## 2023-08-25 DIAGNOSIS — N2581 Secondary hyperparathyroidism of renal origin: Secondary | ICD-10-CM | POA: Diagnosis not present

## 2023-08-25 DIAGNOSIS — D509 Iron deficiency anemia, unspecified: Secondary | ICD-10-CM | POA: Diagnosis not present

## 2023-08-25 DIAGNOSIS — D631 Anemia in chronic kidney disease: Secondary | ICD-10-CM | POA: Diagnosis not present

## 2023-08-25 DIAGNOSIS — N186 End stage renal disease: Secondary | ICD-10-CM | POA: Diagnosis not present

## 2023-08-25 DIAGNOSIS — Z992 Dependence on renal dialysis: Secondary | ICD-10-CM | POA: Diagnosis not present

## 2023-08-25 NOTE — Telephone Encounter (Signed)
 I have reviewed lithium  level dated 08/23/2023-0.77-therapeutic.  Received from [facility], [address],[place].

## 2023-08-27 DIAGNOSIS — D509 Iron deficiency anemia, unspecified: Secondary | ICD-10-CM | POA: Diagnosis not present

## 2023-08-27 DIAGNOSIS — D631 Anemia in chronic kidney disease: Secondary | ICD-10-CM | POA: Diagnosis not present

## 2023-08-27 DIAGNOSIS — N186 End stage renal disease: Secondary | ICD-10-CM | POA: Diagnosis not present

## 2023-08-27 DIAGNOSIS — N2581 Secondary hyperparathyroidism of renal origin: Secondary | ICD-10-CM | POA: Diagnosis not present

## 2023-08-27 DIAGNOSIS — Z992 Dependence on renal dialysis: Secondary | ICD-10-CM | POA: Diagnosis not present

## 2023-08-27 DIAGNOSIS — N250 Renal osteodystrophy: Secondary | ICD-10-CM | POA: Diagnosis not present

## 2023-08-28 DIAGNOSIS — D509 Iron deficiency anemia, unspecified: Secondary | ICD-10-CM | POA: Diagnosis not present

## 2023-08-28 DIAGNOSIS — N186 End stage renal disease: Secondary | ICD-10-CM | POA: Diagnosis not present

## 2023-08-28 DIAGNOSIS — N2581 Secondary hyperparathyroidism of renal origin: Secondary | ICD-10-CM | POA: Diagnosis not present

## 2023-08-28 DIAGNOSIS — D631 Anemia in chronic kidney disease: Secondary | ICD-10-CM | POA: Diagnosis not present

## 2023-08-28 DIAGNOSIS — N250 Renal osteodystrophy: Secondary | ICD-10-CM | POA: Diagnosis not present

## 2023-08-28 DIAGNOSIS — Z992 Dependence on renal dialysis: Secondary | ICD-10-CM | POA: Diagnosis not present
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
## 2023-08-29 ENCOUNTER — Encounter: Payer: Self-pay | Admitting: Psychiatry

## 2023-08-29 ENCOUNTER — Ambulatory Visit (INDEPENDENT_AMBULATORY_CARE_PROVIDER_SITE_OTHER): Admitting: Psychiatry

## 2023-08-29 ENCOUNTER — Other Ambulatory Visit: Payer: Self-pay

## 2023-08-29 VITALS — BP 131/74 | HR 73 | Temp 98.2°F | Ht 66.0 in | Wt 190.4 lb

## 2023-08-29 DIAGNOSIS — F3176 Bipolar disorder, in full remission, most recent episode depressed: Secondary | ICD-10-CM | POA: Diagnosis not present

## 2023-08-29 DIAGNOSIS — G3184 Mild cognitive impairment, so stated: Secondary | ICD-10-CM

## 2023-08-29 DIAGNOSIS — G4701 Insomnia due to medical condition: Secondary | ICD-10-CM | POA: Diagnosis not present

## 2023-08-29 DIAGNOSIS — R259 Unspecified abnormal involuntary movements: Secondary | ICD-10-CM

## 2023-08-29 MED ORDER — OLANZAPINE 2.5 MG PO TABS: 2.5000 mg | ORAL_TABLET | Freq: Every day | ORAL | 0 refills | Status: DC

## 2023-08-29 MED ORDER — ESZOPICLONE 2 MG PO TABS: 2.0000 mg | ORAL_TABLET | Freq: Every evening | ORAL | 0 refills | Status: DC | PRN

## 2023-08-29 MED ORDER — BUPROPION HCL 100 MG PO TABS
100.0000 mg | ORAL_TABLET | Freq: Every morning | ORAL | 1 refills | Status: DC
Start: 2023-08-29 — End: 2023-12-14

## 2023-08-30 ENCOUNTER — Other Ambulatory Visit
Admission: RE | Admit: 2023-08-30 | Discharge: 2023-08-30 | Disposition: A | Discharge status: Home / Self Care | Source: Ambulatory Visit | Attending: Nephrology | Admitting: Nephrology

## 2023-08-30 DIAGNOSIS — D631 Anemia in chronic kidney disease: Secondary | ICD-10-CM | POA: Diagnosis not present

## 2023-08-30 DIAGNOSIS — R7889 Finding of other specified substances, not normally found in blood: Secondary | ICD-10-CM | POA: Insufficient documentation

## 2023-08-30 DIAGNOSIS — Z992 Dependence on renal dialysis: Secondary | ICD-10-CM | POA: Diagnosis not present

## 2023-08-30 DIAGNOSIS — N250 Renal osteodystrophy: Secondary | ICD-10-CM | POA: Diagnosis not present

## 2023-08-30 DIAGNOSIS — N2581 Secondary hyperparathyroidism of renal origin: Secondary | ICD-10-CM | POA: Diagnosis not present

## 2023-08-30 DIAGNOSIS — N186 End stage renal disease: Secondary | ICD-10-CM | POA: Diagnosis not present

## 2023-08-30 DIAGNOSIS — D509 Iron deficiency anemia, unspecified: Secondary | ICD-10-CM | POA: Diagnosis not present

## 2023-08-30 LAB — LITHIUM LEVEL: Lithium Lvl: 0.8 mmol/L (ref 0.60–1.20)

## 2023-08-31 ENCOUNTER — Telehealth: Payer: Self-pay | Admitting: Psychiatry

## 2023-08-31 DIAGNOSIS — R2689 Other abnormalities of gait and mobility: Secondary | ICD-10-CM | POA: Diagnosis not present

## 2023-08-31 DIAGNOSIS — R251 Tremor, unspecified: Secondary | ICD-10-CM | POA: Diagnosis not present

## 2023-08-31 DIAGNOSIS — R413 Other amnesia: Secondary | ICD-10-CM | POA: Diagnosis not present

## 2023-08-31 DIAGNOSIS — Z1331 Encounter for screening for depression: Secondary | ICD-10-CM | POA: Diagnosis not present

## 2023-08-31 NOTE — Telephone Encounter (Signed)
 I have reviewed labs-lithium  level dated 08/30/2023-0.80-therapeutic.

## 2023-09-01 DIAGNOSIS — D509 Iron deficiency anemia, unspecified: Secondary | ICD-10-CM | POA: Diagnosis not present

## 2023-09-01 DIAGNOSIS — N186 End stage renal disease: Secondary | ICD-10-CM | POA: Diagnosis not present

## 2023-09-01 DIAGNOSIS — D631 Anemia in chronic kidney disease: Secondary | ICD-10-CM | POA: Diagnosis not present

## 2023-09-01 DIAGNOSIS — Z992 Dependence on renal dialysis: Secondary | ICD-10-CM | POA: Diagnosis not present

## 2023-09-01 DIAGNOSIS — N250 Renal osteodystrophy: Secondary | ICD-10-CM | POA: Diagnosis not present

## 2023-09-01 DIAGNOSIS — N2581 Secondary hyperparathyroidism of renal origin: Secondary | ICD-10-CM | POA: Diagnosis not present

## 2023-09-03 DIAGNOSIS — N2581 Secondary hyperparathyroidism of renal origin: Secondary | ICD-10-CM | POA: Diagnosis not present

## 2023-09-03 DIAGNOSIS — D509 Iron deficiency anemia, unspecified: Secondary | ICD-10-CM | POA: Diagnosis not present

## 2023-09-03 DIAGNOSIS — N250 Renal osteodystrophy: Secondary | ICD-10-CM | POA: Diagnosis not present

## 2023-09-03 DIAGNOSIS — D631 Anemia in chronic kidney disease: Secondary | ICD-10-CM | POA: Diagnosis not present

## 2023-09-03 DIAGNOSIS — N186 End stage renal disease: Secondary | ICD-10-CM | POA: Diagnosis not present

## 2023-09-03 DIAGNOSIS — Z992 Dependence on renal dialysis: Secondary | ICD-10-CM | POA: Diagnosis not present

## 2023-09-06 ENCOUNTER — Other Ambulatory Visit
Admission: RE | Admit: 2023-09-06 | Discharge: 2023-09-06 | Disposition: A | Discharge status: Home / Self Care | Source: Ambulatory Visit | Attending: Nephrology | Admitting: Nephrology

## 2023-09-06 DIAGNOSIS — D631 Anemia in chronic kidney disease: Secondary | ICD-10-CM | POA: Diagnosis not present

## 2023-09-06 DIAGNOSIS — R7889 Finding of other specified substances, not normally found in blood: Secondary | ICD-10-CM | POA: Diagnosis not present

## 2023-09-06 DIAGNOSIS — N250 Renal osteodystrophy: Secondary | ICD-10-CM | POA: Diagnosis not present

## 2023-09-06 DIAGNOSIS — Z992 Dependence on renal dialysis: Secondary | ICD-10-CM | POA: Diagnosis not present

## 2023-09-06 DIAGNOSIS — N2581 Secondary hyperparathyroidism of renal origin: Secondary | ICD-10-CM | POA: Diagnosis not present

## 2023-09-06 DIAGNOSIS — N186 End stage renal disease: Secondary | ICD-10-CM | POA: Diagnosis not present

## 2023-09-06 DIAGNOSIS — D509 Iron deficiency anemia, unspecified: Secondary | ICD-10-CM | POA: Diagnosis not present

## 2023-09-06 LAB — LITHIUM LEVEL: Lithium Lvl: 0.85 mmol/L (ref 0.60–1.20)

## 2023-09-07 ENCOUNTER — Telehealth: Payer: Self-pay | Admitting: Psychiatry

## 2023-09-07 NOTE — Telephone Encounter (Signed)
 I have reviewed labs-lithium  level received from [facility] , 0.85-therapeutic dated 09/06/2023.

## 2023-09-08 DIAGNOSIS — N186 End stage renal disease: Secondary | ICD-10-CM | POA: Diagnosis not present

## 2023-09-08 DIAGNOSIS — N2581 Secondary hyperparathyroidism of renal origin: Secondary | ICD-10-CM | POA: Diagnosis not present

## 2023-09-08 DIAGNOSIS — D509 Iron deficiency anemia, unspecified: Secondary | ICD-10-CM | POA: Diagnosis not present

## 2023-09-08 DIAGNOSIS — D631 Anemia in chronic kidney disease: Secondary | ICD-10-CM | POA: Diagnosis not present

## 2023-09-08 DIAGNOSIS — N250 Renal osteodystrophy: Secondary | ICD-10-CM | POA: Diagnosis not present

## 2023-09-08 DIAGNOSIS — Z992 Dependence on renal dialysis: Secondary | ICD-10-CM | POA: Diagnosis not present

## 2023-09-10 DIAGNOSIS — N250 Renal osteodystrophy: Secondary | ICD-10-CM | POA: Diagnosis not present

## 2023-09-10 DIAGNOSIS — D509 Iron deficiency anemia, unspecified: Secondary | ICD-10-CM | POA: Diagnosis not present

## 2023-09-10 DIAGNOSIS — D631 Anemia in chronic kidney disease: Secondary | ICD-10-CM | POA: Diagnosis not present

## 2023-09-10 DIAGNOSIS — Z992 Dependence on renal dialysis: Secondary | ICD-10-CM | POA: Diagnosis not present

## 2023-09-10 DIAGNOSIS — N2581 Secondary hyperparathyroidism of renal origin: Secondary | ICD-10-CM | POA: Diagnosis not present

## 2023-09-10 DIAGNOSIS — N186 End stage renal disease: Secondary | ICD-10-CM | POA: Diagnosis not present

## 2023-09-13 ENCOUNTER — Other Ambulatory Visit
Admission: RE | Admit: 2023-09-13 | Discharge: 2023-09-13 | Disposition: A | Discharge status: Home / Self Care | Source: Ambulatory Visit | Attending: Nephrology | Admitting: Nephrology

## 2023-09-13 DIAGNOSIS — N2581 Secondary hyperparathyroidism of renal origin: Secondary | ICD-10-CM | POA: Diagnosis not present

## 2023-09-13 DIAGNOSIS — D509 Iron deficiency anemia, unspecified: Secondary | ICD-10-CM | POA: Diagnosis not present

## 2023-09-13 DIAGNOSIS — R7889 Finding of other specified substances, not normally found in blood: Secondary | ICD-10-CM | POA: Diagnosis not present

## 2023-09-13 DIAGNOSIS — N250 Renal osteodystrophy: Secondary | ICD-10-CM | POA: Diagnosis not present

## 2023-09-13 DIAGNOSIS — N186 End stage renal disease: Secondary | ICD-10-CM | POA: Diagnosis not present

## 2023-09-13 DIAGNOSIS — D631 Anemia in chronic kidney disease: Secondary | ICD-10-CM | POA: Diagnosis not present

## 2023-09-13 DIAGNOSIS — Z992 Dependence on renal dialysis: Secondary | ICD-10-CM | POA: Diagnosis not present

## 2023-09-13 LAB — LITHIUM LEVEL: Lithium Lvl: 0.85 mmol/L (ref 0.60–1.20)

## 2023-09-15 DIAGNOSIS — D509 Iron deficiency anemia, unspecified: Secondary | ICD-10-CM | POA: Diagnosis not present

## 2023-09-15 DIAGNOSIS — N2581 Secondary hyperparathyroidism of renal origin: Secondary | ICD-10-CM | POA: Diagnosis not present

## 2023-09-15 DIAGNOSIS — D631 Anemia in chronic kidney disease: Secondary | ICD-10-CM | POA: Diagnosis not present

## 2023-09-15 DIAGNOSIS — N186 End stage renal disease: Secondary | ICD-10-CM | POA: Diagnosis not present

## 2023-09-15 DIAGNOSIS — N250 Renal osteodystrophy: Secondary | ICD-10-CM | POA: Diagnosis not present

## 2023-09-15 DIAGNOSIS — Z992 Dependence on renal dialysis: Secondary | ICD-10-CM | POA: Diagnosis not present

## 2023-09-16 ENCOUNTER — Telehealth (INDEPENDENT_AMBULATORY_CARE_PROVIDER_SITE_OTHER): Admitting: Psychiatry

## 2023-09-16 ENCOUNTER — Encounter: Payer: Self-pay | Admitting: Psychiatry

## 2023-09-16 DIAGNOSIS — R52 Pain, unspecified: Secondary | ICD-10-CM | POA: Diagnosis not present

## 2023-09-16 DIAGNOSIS — G3184 Mild cognitive impairment, so stated: Secondary | ICD-10-CM

## 2023-09-16 DIAGNOSIS — F3176 Bipolar disorder, in full remission, most recent episode depressed: Secondary | ICD-10-CM

## 2023-09-16 DIAGNOSIS — G4701 Insomnia due to medical condition: Secondary | ICD-10-CM | POA: Diagnosis not present

## 2023-09-16 DIAGNOSIS — T50915A Adverse effect of multiple unspecified drugs, medicaments and biological substances, initial encounter: Secondary | ICD-10-CM

## 2023-09-16 MED ORDER — ESZOPICLONE 2 MG PO TABS
2.0000 mg | ORAL_TABLET | Freq: Every evening | ORAL | 1 refills | Status: DC | PRN
Start: 2023-09-27 — End: 2023-12-05
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
## 2023-09-17 DIAGNOSIS — Z992 Dependence on renal dialysis: Secondary | ICD-10-CM | POA: Diagnosis not present

## 2023-09-17 DIAGNOSIS — N2581 Secondary hyperparathyroidism of renal origin: Secondary | ICD-10-CM | POA: Diagnosis not present

## 2023-09-17 DIAGNOSIS — N250 Renal osteodystrophy: Secondary | ICD-10-CM | POA: Diagnosis not present

## 2023-09-17 DIAGNOSIS — D631 Anemia in chronic kidney disease: Secondary | ICD-10-CM | POA: Diagnosis not present

## 2023-09-17 DIAGNOSIS — D509 Iron deficiency anemia, unspecified: Secondary | ICD-10-CM | POA: Diagnosis not present

## 2023-09-17 DIAGNOSIS — N186 End stage renal disease: Secondary | ICD-10-CM | POA: Diagnosis not present

## 2023-09-18 DIAGNOSIS — D509 Iron deficiency anemia, unspecified: Secondary | ICD-10-CM | POA: Diagnosis not present

## 2023-09-18 DIAGNOSIS — N186 End stage renal disease: Secondary | ICD-10-CM | POA: Diagnosis not present

## 2023-09-18 DIAGNOSIS — D631 Anemia in chronic kidney disease: Secondary | ICD-10-CM | POA: Diagnosis not present

## 2023-09-18 DIAGNOSIS — Z992 Dependence on renal dialysis: Secondary | ICD-10-CM | POA: Diagnosis not present

## 2023-09-18 DIAGNOSIS — N250 Renal osteodystrophy: Secondary | ICD-10-CM | POA: Diagnosis not present

## 2023-09-18 DIAGNOSIS — N2581 Secondary hyperparathyroidism of renal origin: Secondary | ICD-10-CM | POA: Diagnosis not present

## 2023-09-19 ENCOUNTER — Telehealth: Payer: Self-pay | Admitting: Psychiatry

## 2023-09-19 DIAGNOSIS — N186 End stage renal disease: Secondary | ICD-10-CM | POA: Diagnosis not present

## 2023-09-19 DIAGNOSIS — Z992 Dependence on renal dialysis: Secondary | ICD-10-CM | POA: Diagnosis not present

## 2023-09-19 NOTE — Telephone Encounter (Signed)
 Reviewed lithium  level received from [facility] dated 09/13/2023-0.85-therapeutic.

## 2023-09-20 ENCOUNTER — Other Ambulatory Visit
Admission: RE | Admit: 2023-09-20 | Discharge: 2023-09-20 | Disposition: A | Discharge status: Home / Self Care | Source: Ambulatory Visit | Attending: Nephrology | Admitting: Nephrology

## 2023-09-20 DIAGNOSIS — Z992 Dependence on renal dialysis: Secondary | ICD-10-CM | POA: Diagnosis not present

## 2023-09-20 DIAGNOSIS — N250 Renal osteodystrophy: Secondary | ICD-10-CM | POA: Diagnosis not present

## 2023-09-20 DIAGNOSIS — R7889 Finding of other specified substances, not normally found in blood: Secondary | ICD-10-CM | POA: Diagnosis not present

## 2023-09-20 DIAGNOSIS — D509 Iron deficiency anemia, unspecified: Secondary | ICD-10-CM | POA: Diagnosis not present

## 2023-09-20 DIAGNOSIS — N2581 Secondary hyperparathyroidism of renal origin: Secondary | ICD-10-CM | POA: Diagnosis not present

## 2023-09-20 DIAGNOSIS — D631 Anemia in chronic kidney disease: Secondary | ICD-10-CM | POA: Diagnosis not present

## 2023-09-20 DIAGNOSIS — N186 End stage renal disease: Secondary | ICD-10-CM | POA: Diagnosis not present

## 2023-09-20 LAB — LITHIUM LEVEL: Lithium Lvl: 0.74 mmol/L (ref 0.60–1.20)

## 2023-09-21 ENCOUNTER — Other Ambulatory Visit: Payer: Self-pay | Admitting: Psychiatry

## 2023-09-21 DIAGNOSIS — F3176 Bipolar disorder, in full remission, most recent episode depressed: Secondary | ICD-10-CM

## 2023-09-21 DIAGNOSIS — G4701 Insomnia due to medical condition: Secondary | ICD-10-CM

## 2023-09-22 ENCOUNTER — Telehealth: Payer: Self-pay | Admitting: Psychiatry

## 2023-09-22 DIAGNOSIS — N186 End stage renal disease: Secondary | ICD-10-CM | POA: Diagnosis not present

## 2023-09-22 DIAGNOSIS — D631 Anemia in chronic kidney disease: Secondary | ICD-10-CM | POA: Diagnosis not present

## 2023-09-22 DIAGNOSIS — N250 Renal osteodystrophy: Secondary | ICD-10-CM | POA: Diagnosis not present

## 2023-09-22 DIAGNOSIS — N2581 Secondary hyperparathyroidism of renal origin: Secondary | ICD-10-CM | POA: Diagnosis not present

## 2023-09-22 DIAGNOSIS — D509 Iron deficiency anemia, unspecified: Secondary | ICD-10-CM | POA: Diagnosis not present

## 2023-09-22 DIAGNOSIS — Z992 Dependence on renal dialysis: Secondary | ICD-10-CM | POA: Diagnosis not present

## 2023-09-22 NOTE — Telephone Encounter (Signed)
 I have reviewed lithium  level dated 09/20/2023-0.74, therapeutic.

## 2023-09-24 DIAGNOSIS — D509 Iron deficiency anemia, unspecified: Secondary | ICD-10-CM | POA: Diagnosis not present

## 2023-09-24 DIAGNOSIS — D631 Anemia in chronic kidney disease: Secondary | ICD-10-CM | POA: Diagnosis not present

## 2023-09-24 DIAGNOSIS — Z992 Dependence on renal dialysis: Secondary | ICD-10-CM | POA: Diagnosis not present

## 2023-09-24 DIAGNOSIS — N250 Renal osteodystrophy: Secondary | ICD-10-CM | POA: Diagnosis not present

## 2023-09-24 DIAGNOSIS — N186 End stage renal disease: Secondary | ICD-10-CM | POA: Diagnosis not present

## 2023-09-24 DIAGNOSIS — N2581 Secondary hyperparathyroidism of renal origin: Secondary | ICD-10-CM | POA: Diagnosis not present

## 2023-09-27 ENCOUNTER — Other Ambulatory Visit
Admission: RE | Admit: 2023-09-27 | Discharge: 2023-09-27 | Disposition: A | Discharge status: Home / Self Care | Source: Ambulatory Visit | Attending: Nephrology | Admitting: Nephrology

## 2023-09-27 ENCOUNTER — Telehealth: Payer: Self-pay | Admitting: Psychiatry

## 2023-09-27 DIAGNOSIS — Z992 Dependence on renal dialysis: Secondary | ICD-10-CM | POA: Diagnosis not present

## 2023-09-27 DIAGNOSIS — D631 Anemia in chronic kidney disease: Secondary | ICD-10-CM | POA: Diagnosis not present

## 2023-09-27 DIAGNOSIS — N186 End stage renal disease: Secondary | ICD-10-CM | POA: Diagnosis not present

## 2023-09-27 DIAGNOSIS — N250 Renal osteodystrophy: Secondary | ICD-10-CM | POA: Diagnosis not present

## 2023-09-27 DIAGNOSIS — D509 Iron deficiency anemia, unspecified: Secondary | ICD-10-CM | POA: Diagnosis not present

## 2023-09-27 DIAGNOSIS — R7889 Finding of other specified substances, not normally found in blood: Secondary | ICD-10-CM | POA: Insufficient documentation

## 2023-09-27 DIAGNOSIS — N2581 Secondary hyperparathyroidism of renal origin: Secondary | ICD-10-CM | POA: Diagnosis not present

## 2023-09-27 LAB — LITHIUM LEVEL: Lithium Lvl: 0.78 mmol/L (ref 0.60–1.20)

## 2023-09-27 NOTE — Telephone Encounter (Signed)
 Patient calls stating he has 1 week left of lunesta . [facility] to [facility] in [place]. Please call to advise

## 2023-09-27 NOTE — Telephone Encounter (Signed)
 Please let patient know to contact his pharmacy since a prescription was sent out for Lunesta  with date specified.

## 2023-09-28 NOTE — Telephone Encounter (Signed)
 Called patient to inform him to call his pharmacy no answer unable to leave a voicemail due to mailbox full

## 2023-09-29 ENCOUNTER — Telehealth: Payer: Self-pay | Admitting: Psychiatry

## 2023-09-29 DIAGNOSIS — D509 Iron deficiency anemia, unspecified: Secondary | ICD-10-CM | POA: Diagnosis not present

## 2023-09-29 DIAGNOSIS — N250 Renal osteodystrophy: Secondary | ICD-10-CM | POA: Diagnosis not present

## 2023-09-29 DIAGNOSIS — N2581 Secondary hyperparathyroidism of renal origin: Secondary | ICD-10-CM | POA: Diagnosis not present

## 2023-09-29 DIAGNOSIS — D631 Anemia in chronic kidney disease: Secondary | ICD-10-CM | POA: Diagnosis not present

## 2023-09-29 DIAGNOSIS — Z992 Dependence on renal dialysis: Secondary | ICD-10-CM | POA: Diagnosis not present

## 2023-09-29 DIAGNOSIS — N186 End stage renal disease: Secondary | ICD-10-CM | POA: Diagnosis not present

## 2023-09-29 NOTE — Telephone Encounter (Signed)
 I have reviewed lithium  level received from [facility] -dated 09/27/2023-0.78-therapeutic.

## 2023-10-01 DIAGNOSIS — D509 Iron deficiency anemia, unspecified: Secondary | ICD-10-CM | POA: Diagnosis not present

## 2023-10-01 DIAGNOSIS — D631 Anemia in chronic kidney disease: Secondary | ICD-10-CM | POA: Diagnosis not present

## 2023-10-01 DIAGNOSIS — N2581 Secondary hyperparathyroidism of renal origin: Secondary | ICD-10-CM | POA: Diagnosis not present

## 2023-10-01 DIAGNOSIS — N186 End stage renal disease: Secondary | ICD-10-CM | POA: Diagnosis not present

## 2023-10-01 DIAGNOSIS — Z992 Dependence on renal dialysis: Secondary | ICD-10-CM | POA: Diagnosis not present

## 2023-10-01 DIAGNOSIS — N250 Renal osteodystrophy: Secondary | ICD-10-CM | POA: Diagnosis not present

## 2023-10-04 ENCOUNTER — Other Ambulatory Visit
Admission: RE | Admit: 2023-10-04 | Discharge: 2023-10-04 | Disposition: A | Discharge status: Home / Self Care | Source: Other Acute Inpatient Hospital | Attending: Nephrology | Admitting: Nephrology

## 2023-10-04 DIAGNOSIS — R7889 Finding of other specified substances, not normally found in blood: Secondary | ICD-10-CM | POA: Insufficient documentation

## 2023-10-04 DIAGNOSIS — D509 Iron deficiency anemia, unspecified: Secondary | ICD-10-CM | POA: Diagnosis not present

## 2023-10-04 DIAGNOSIS — N186 End stage renal disease: Secondary | ICD-10-CM | POA: Diagnosis not present

## 2023-10-04 DIAGNOSIS — D631 Anemia in chronic kidney disease: Secondary | ICD-10-CM | POA: Diagnosis not present

## 2023-10-04 DIAGNOSIS — Z992 Dependence on renal dialysis: Secondary | ICD-10-CM | POA: Diagnosis not present

## 2023-10-04 DIAGNOSIS — N250 Renal osteodystrophy: Secondary | ICD-10-CM | POA: Diagnosis not present

## 2023-10-04 DIAGNOSIS — N2581 Secondary hyperparathyroidism of renal origin: Secondary | ICD-10-CM | POA: Diagnosis not present

## 2023-10-04 LAB — LITHIUM LEVEL: Lithium Lvl: 0.78 mmol/L (ref 0.60–1.20)

## 2023-10-05 ENCOUNTER — Telehealth: Payer: Self-pay | Admitting: Psychiatry

## 2023-10-05 NOTE — Telephone Encounter (Signed)
 I have reviewed lithium  level- 0.78-therapeutic dated 10/04/2023.

## 2023-10-06 DIAGNOSIS — Z992 Dependence on renal dialysis: Secondary | ICD-10-CM | POA: Diagnosis not present

## 2023-10-06 DIAGNOSIS — D509 Iron deficiency anemia, unspecified: Secondary | ICD-10-CM | POA: Diagnosis not present

## 2023-10-06 DIAGNOSIS — D631 Anemia in chronic kidney disease: Secondary | ICD-10-CM | POA: Diagnosis not present

## 2023-10-06 DIAGNOSIS — N2581 Secondary hyperparathyroidism of renal origin: Secondary | ICD-10-CM | POA: Diagnosis not present

## 2023-10-06 DIAGNOSIS — N186 End stage renal disease: Secondary | ICD-10-CM | POA: Diagnosis not present

## 2023-10-06 DIAGNOSIS — N250 Renal osteodystrophy: Secondary | ICD-10-CM | POA: Diagnosis not present

## 2023-10-07 DIAGNOSIS — D485 Neoplasm of uncertain behavior of skin: Secondary | ICD-10-CM | POA: Diagnosis not present

## 2023-10-07 DIAGNOSIS — L858 Other specified epidermal thickening: Secondary | ICD-10-CM | POA: Diagnosis not present

## 2023-10-08 DIAGNOSIS — N250 Renal osteodystrophy: Secondary | ICD-10-CM | POA: Diagnosis not present

## 2023-10-08 DIAGNOSIS — N2581 Secondary hyperparathyroidism of renal origin: Secondary | ICD-10-CM | POA: Diagnosis not present

## 2023-10-08 DIAGNOSIS — D631 Anemia in chronic kidney disease: Secondary | ICD-10-CM | POA: Diagnosis not present

## 2023-10-08 DIAGNOSIS — Z992 Dependence on renal dialysis: Secondary | ICD-10-CM | POA: Diagnosis not present

## 2023-10-08 DIAGNOSIS — D509 Iron deficiency anemia, unspecified: Secondary | ICD-10-CM | POA: Diagnosis not present

## 2023-10-08 DIAGNOSIS — N186 End stage renal disease: Secondary | ICD-10-CM | POA: Diagnosis not present

## 2023-10-11 ENCOUNTER — Other Ambulatory Visit
Admission: RE | Admit: 2023-10-11 | Discharge: 2023-10-11 | Disposition: A | Discharge status: Home / Self Care | Source: Ambulatory Visit | Attending: Nephrology | Admitting: Nephrology

## 2023-10-11 DIAGNOSIS — Z992 Dependence on renal dialysis: Secondary | ICD-10-CM | POA: Diagnosis not present

## 2023-10-11 DIAGNOSIS — R7889 Finding of other specified substances, not normally found in blood: Secondary | ICD-10-CM | POA: Insufficient documentation

## 2023-10-11 DIAGNOSIS — D631 Anemia in chronic kidney disease: Secondary | ICD-10-CM | POA: Diagnosis not present

## 2023-10-11 DIAGNOSIS — D509 Iron deficiency anemia, unspecified: Secondary | ICD-10-CM | POA: Diagnosis not present

## 2023-10-11 DIAGNOSIS — N2581 Secondary hyperparathyroidism of renal origin: Secondary | ICD-10-CM | POA: Diagnosis not present

## 2023-10-11 DIAGNOSIS — N186 End stage renal disease: Secondary | ICD-10-CM | POA: Diagnosis not present

## 2023-10-11 DIAGNOSIS — N250 Renal osteodystrophy: Secondary | ICD-10-CM | POA: Diagnosis not present

## 2023-10-11 LAB — LITHIUM LEVEL: Lithium Lvl: 0.74 mmol/L (ref 0.60–1.20)

## 2023-10-13 ENCOUNTER — Telehealth: Payer: Self-pay | Admitting: Psychiatry

## 2023-10-13 DIAGNOSIS — M1711 Unilateral primary osteoarthritis, right knee: Secondary | ICD-10-CM | POA: Diagnosis not present

## 2023-10-13 DIAGNOSIS — N186 End stage renal disease: Secondary | ICD-10-CM | POA: Diagnosis not present

## 2023-10-13 DIAGNOSIS — M25561 Pain in right knee: Secondary | ICD-10-CM | POA: Diagnosis not present

## 2023-10-13 DIAGNOSIS — D509 Iron deficiency anemia, unspecified: Secondary | ICD-10-CM | POA: Diagnosis not present

## 2023-10-13 DIAGNOSIS — D631 Anemia in chronic kidney disease: Secondary | ICD-10-CM | POA: Diagnosis not present

## 2023-10-13 DIAGNOSIS — G8929 Other chronic pain: Secondary | ICD-10-CM | POA: Diagnosis not present

## 2023-10-13 DIAGNOSIS — N250 Renal osteodystrophy: Secondary | ICD-10-CM | POA: Diagnosis not present

## 2023-10-13 DIAGNOSIS — Z992 Dependence on renal dialysis: Secondary | ICD-10-CM | POA: Diagnosis not present

## 2023-10-13 DIAGNOSIS — N2581 Secondary hyperparathyroidism of renal origin: Secondary | ICD-10-CM | POA: Diagnosis not present

## 2023-10-13 NOTE — Telephone Encounter (Signed)
 I have reviewed lithium  level dated 10/11/2023-0.74-therapeutic. Received from [facility] .

## 2023-10-15 DIAGNOSIS — N250 Renal osteodystrophy: Secondary | ICD-10-CM | POA: Diagnosis not present

## 2023-10-15 DIAGNOSIS — Z992 Dependence on renal dialysis: Secondary | ICD-10-CM | POA: Diagnosis not present

## 2023-10-15 DIAGNOSIS — N186 End stage renal disease: Secondary | ICD-10-CM | POA: Diagnosis not present

## 2023-10-15 DIAGNOSIS — D631 Anemia in chronic kidney disease: Secondary | ICD-10-CM | POA: Diagnosis not present

## 2023-10-15 DIAGNOSIS — N2581 Secondary hyperparathyroidism of renal origin: Secondary | ICD-10-CM | POA: Diagnosis not present

## 2023-10-15 DIAGNOSIS — D509 Iron deficiency anemia, unspecified: Secondary | ICD-10-CM | POA: Diagnosis not present

## 2023-10-18 ENCOUNTER — Other Ambulatory Visit
Admission: RE | Admit: 2023-10-18 | Discharge: 2023-10-18 | Disposition: A | Discharge status: Home / Self Care | Source: Ambulatory Visit | Attending: Nephrology | Admitting: Nephrology

## 2023-10-18 ENCOUNTER — Other Ambulatory Visit: Payer: Self-pay | Admitting: Psychiatry

## 2023-10-18 DIAGNOSIS — N186 End stage renal disease: Secondary | ICD-10-CM | POA: Diagnosis not present

## 2023-10-18 DIAGNOSIS — F3175 Bipolar disorder, in partial remission, most recent episode depressed: Secondary | ICD-10-CM

## 2023-10-18 DIAGNOSIS — N250 Renal osteodystrophy: Secondary | ICD-10-CM | POA: Diagnosis not present

## 2023-10-18 DIAGNOSIS — R7889 Finding of other specified substances, not normally found in blood: Secondary | ICD-10-CM | POA: Insufficient documentation

## 2023-10-18 DIAGNOSIS — N2581 Secondary hyperparathyroidism of renal origin: Secondary | ICD-10-CM | POA: Diagnosis not present

## 2023-10-18 DIAGNOSIS — D509 Iron deficiency anemia, unspecified: Secondary | ICD-10-CM | POA: Diagnosis not present

## 2023-10-18 DIAGNOSIS — Z992 Dependence on renal dialysis: Secondary | ICD-10-CM | POA: Diagnosis not present

## 2023-10-18 DIAGNOSIS — D631 Anemia in chronic kidney disease: Secondary | ICD-10-CM | POA: Diagnosis not present

## 2023-10-18 LAB — LITHIUM LEVEL: Lithium Lvl: 0.73 mmol/L (ref 0.60–1.20)

## 2023-10-19 ENCOUNTER — Other Ambulatory Visit: Payer: Self-pay | Admitting: Nurse Practitioner

## 2023-10-19 DIAGNOSIS — E782 Mixed hyperlipidemia: Secondary | ICD-10-CM

## 2023-10-19 NOTE — Telephone Encounter (Signed)
 Rx 08/08/23 #90 1RF- too soon Requested Prescriptions  Pending Prescriptions Disp Refills   rosuvastatin  (CRESTOR ) 10 MG tablet [Pharmacy Med Name: ROSUVASTATIN  TABS 10MG ] 90 tablet 3    Sig: TAKE 1 TABLET DAILY     Cardiovascular:  Antilipid - Statins 2 Failed - 10/19/2023  3:22 PM      Failed - Cr in normal range and within 360 days    Creatinine  Date Value Ref Range Status  03/08/2014 2.22 (H) 0.60 - 1.30 mg/dL Final   Creatinine, Ser  Date Value Ref Range Status  08/08/2023 7.41 (H) 0.76 - 1.27 mg/dL Final   Creatinine, Urine  Date Value Ref Range Status  08/01/2019 194 mg/dL Final    Comment:    Performed at [facility], [address] [place], [place] [address]         Failed - Lipid Panel in normal range within the last 12 months    Cholesterol, Total  Date Value Ref Range Status  08/08/2023 115 100 - 199 mg/dL Final   Cholesterol Piccolo, Waived  Date Value Ref Range Status  10/03/2017 180 <200 mg/dL Final    Comment:                            Desirable                <200                         Borderline High      200- 239                         High                     >239    LDL Chol Calc (NIH)  Date Value Ref Range Status  08/08/2023 46 0 - 99 mg/dL Final   HDL  Date Value Ref Range Status  08/08/2023 39 (L) >39 mg/dL Final   Triglycerides  Date Value Ref Range Status  08/08/2023 183 (H) 0 - 149 mg/dL Final   Triglycerides Piccolo,Waived  Date Value Ref Range Status  10/03/2017 252 (H) <150 mg/dL Final    Comment:                            Normal                   <150                         Borderline High     150 - 199                         High                200 - 499                         Very High                >499          Passed - Patient is not pregnant      Passed - Valid encounter within last 12 months    Recent Outpatient Visits  2 months ago Bipolar disorder, in full remission, most recent  episode depressed [facility])   [place] [facility] Melvin Pao, NP   4 months ago Runny nose   [place] [facility] Melvin Pao, NP   5 months ago Seasonal allergies   [place] [facility] Melvin Pao, NP

## 2023-10-20 DIAGNOSIS — N250 Renal osteodystrophy: Secondary | ICD-10-CM | POA: Diagnosis not present

## 2023-10-20 DIAGNOSIS — D631 Anemia in chronic kidney disease: Secondary | ICD-10-CM | POA: Diagnosis not present

## 2023-10-20 DIAGNOSIS — N186 End stage renal disease: Secondary | ICD-10-CM | POA: Diagnosis not present

## 2023-10-20 DIAGNOSIS — N2581 Secondary hyperparathyroidism of renal origin: Secondary | ICD-10-CM | POA: Diagnosis not present

## 2023-10-20 DIAGNOSIS — Z992 Dependence on renal dialysis: Secondary | ICD-10-CM | POA: Diagnosis not present

## 2023-10-20 DIAGNOSIS — D509 Iron deficiency anemia, unspecified: Secondary | ICD-10-CM | POA: Diagnosis not present

## 2023-10-21 ENCOUNTER — Telehealth: Payer: Self-pay | Admitting: Psychiatry

## 2023-10-21 DIAGNOSIS — D631 Anemia in chronic kidney disease: Secondary | ICD-10-CM | POA: Diagnosis not present

## 2023-10-21 DIAGNOSIS — H353132 Nonexudative age-related macular degeneration, bilateral, intermediate dry stage: Secondary | ICD-10-CM | POA: Diagnosis not present

## 2023-10-21 DIAGNOSIS — D509 Iron deficiency anemia, unspecified: Secondary | ICD-10-CM | POA: Diagnosis not present

## 2023-10-21 DIAGNOSIS — Z992 Dependence on renal dialysis: Secondary | ICD-10-CM | POA: Diagnosis not present

## 2023-10-21 DIAGNOSIS — H47012 Ischemic optic neuropathy, left eye: Secondary | ICD-10-CM | POA: Diagnosis not present

## 2023-10-21 DIAGNOSIS — N186 End stage renal disease: Secondary | ICD-10-CM | POA: Diagnosis not present

## 2023-10-21 DIAGNOSIS — Z23 Encounter for immunization: Secondary | ICD-10-CM | POA: Diagnosis not present

## 2023-10-21 DIAGNOSIS — Z961 Presence of intraocular lens: Secondary | ICD-10-CM | POA: Diagnosis not present

## 2023-10-21 DIAGNOSIS — N250 Renal osteodystrophy: Secondary | ICD-10-CM | POA: Diagnosis not present

## 2023-10-21 DIAGNOSIS — N2581 Secondary hyperparathyroidism of renal origin: Secondary | ICD-10-CM | POA: Diagnosis not present

## 2023-10-21 DIAGNOSIS — H26492 Other secondary cataract, left eye: Secondary | ICD-10-CM | POA: Diagnosis not present

## 2023-10-21 NOTE — Telephone Encounter (Signed)
 I have reviewed lithium  level-10/18/2023-0.73-therapeutic.

## 2023-10-21 NOTE — Telephone Encounter (Signed)
 I have reviewed lithium  level dated 10/18/2023-0.73-therapeutic.

## 2023-10-22 DIAGNOSIS — N2581 Secondary hyperparathyroidism of renal origin: Secondary | ICD-10-CM | POA: Diagnosis not present

## 2023-10-22 DIAGNOSIS — Z992 Dependence on renal dialysis: Secondary | ICD-10-CM | POA: Diagnosis not present

## 2023-10-22 DIAGNOSIS — N186 End stage renal disease: Secondary | ICD-10-CM | POA: Diagnosis not present

## 2023-10-22 DIAGNOSIS — D631 Anemia in chronic kidney disease: Secondary | ICD-10-CM | POA: Diagnosis not present

## 2023-10-22 DIAGNOSIS — Z23 Encounter for immunization: Secondary | ICD-10-CM | POA: Diagnosis not present

## 2023-10-22 DIAGNOSIS — D509 Iron deficiency anemia, unspecified: Secondary | ICD-10-CM | POA: Diagnosis not present

## 2023-10-25 ENCOUNTER — Other Ambulatory Visit
Admission: RE | Admit: 2023-10-25 | Discharge: 2023-10-25 | Disposition: A | Discharge status: Home / Self Care | Source: Other Acute Inpatient Hospital | Attending: Nephrology | Admitting: Nephrology

## 2023-10-25 DIAGNOSIS — D631 Anemia in chronic kidney disease: Secondary | ICD-10-CM | POA: Diagnosis not present

## 2023-10-25 DIAGNOSIS — R7889 Finding of other specified substances, not normally found in blood: Secondary | ICD-10-CM | POA: Diagnosis not present

## 2023-10-25 DIAGNOSIS — N2581 Secondary hyperparathyroidism of renal origin: Secondary | ICD-10-CM | POA: Diagnosis not present

## 2023-10-25 DIAGNOSIS — N186 End stage renal disease: Secondary | ICD-10-CM | POA: Diagnosis not present

## 2023-10-25 DIAGNOSIS — Z23 Encounter for immunization: Secondary | ICD-10-CM | POA: Diagnosis not present

## 2023-10-25 DIAGNOSIS — D509 Iron deficiency anemia, unspecified: Secondary | ICD-10-CM | POA: Diagnosis not present

## 2023-10-25 DIAGNOSIS — Z992 Dependence on renal dialysis: Secondary | ICD-10-CM | POA: Diagnosis not present

## 2023-10-25 LAB — LITHIUM LEVEL: Lithium Lvl: 0.84 mmol/L (ref 0.60–1.20)

## 2023-10-27 DIAGNOSIS — N186 End stage renal disease: Secondary | ICD-10-CM | POA: Diagnosis not present

## 2023-10-27 DIAGNOSIS — Z992 Dependence on renal dialysis: Secondary | ICD-10-CM | POA: Diagnosis not present

## 2023-10-27 DIAGNOSIS — D631 Anemia in chronic kidney disease: Secondary | ICD-10-CM | POA: Diagnosis not present

## 2023-10-27 DIAGNOSIS — Z23 Encounter for immunization: Secondary | ICD-10-CM | POA: Diagnosis not present

## 2023-10-27 DIAGNOSIS — N2581 Secondary hyperparathyroidism of renal origin: Secondary | ICD-10-CM | POA: Diagnosis not present

## 2023-10-27 DIAGNOSIS — D509 Iron deficiency anemia, unspecified: Secondary | ICD-10-CM | POA: Diagnosis not present

## 2023-10-29 DIAGNOSIS — N2581 Secondary hyperparathyroidism of renal origin: Secondary | ICD-10-CM | POA: Diagnosis not present

## 2023-10-29 DIAGNOSIS — D631 Anemia in chronic kidney disease: Secondary | ICD-10-CM | POA: Diagnosis not present

## 2023-10-29 DIAGNOSIS — Z23 Encounter for immunization: Secondary | ICD-10-CM | POA: Diagnosis not present

## 2023-10-29 DIAGNOSIS — N186 End stage renal disease: Secondary | ICD-10-CM | POA: Diagnosis not present

## 2023-10-29 DIAGNOSIS — D509 Iron deficiency anemia, unspecified: Secondary | ICD-10-CM | POA: Diagnosis not present

## 2023-10-29 DIAGNOSIS — Z992 Dependence on renal dialysis: Secondary | ICD-10-CM | POA: Diagnosis not present

## 2023-11-01 DIAGNOSIS — D631 Anemia in chronic kidney disease: Secondary | ICD-10-CM | POA: Diagnosis not present

## 2023-11-01 DIAGNOSIS — Z992 Dependence on renal dialysis: Secondary | ICD-10-CM | POA: Diagnosis not present

## 2023-11-01 DIAGNOSIS — D509 Iron deficiency anemia, unspecified: Secondary | ICD-10-CM | POA: Diagnosis not present

## 2023-11-01 DIAGNOSIS — N186 End stage renal disease: Secondary | ICD-10-CM | POA: Diagnosis not present

## 2023-11-01 DIAGNOSIS — N2581 Secondary hyperparathyroidism of renal origin: Secondary | ICD-10-CM | POA: Diagnosis not present

## 2023-11-01 DIAGNOSIS — Z23 Encounter for immunization: Secondary | ICD-10-CM | POA: Diagnosis not present

## 2023-11-02 ENCOUNTER — Telehealth: Payer: Self-pay

## 2023-11-03 DIAGNOSIS — D631 Anemia in chronic kidney disease: Secondary | ICD-10-CM | POA: Diagnosis not present

## 2023-11-03 DIAGNOSIS — Z992 Dependence on renal dialysis: Secondary | ICD-10-CM | POA: Diagnosis not present

## 2023-11-03 DIAGNOSIS — N186 End stage renal disease: Secondary | ICD-10-CM | POA: Diagnosis not present

## 2023-11-03 DIAGNOSIS — N2581 Secondary hyperparathyroidism of renal origin: Secondary | ICD-10-CM | POA: Diagnosis not present

## 2023-11-03 DIAGNOSIS — D509 Iron deficiency anemia, unspecified: Secondary | ICD-10-CM | POA: Diagnosis not present

## 2023-11-03 DIAGNOSIS — Z23 Encounter for immunization: Secondary | ICD-10-CM | POA: Diagnosis not present

## 2023-11-03 NOTE — Telephone Encounter (Signed)
 disregard

## 2023-11-05 DIAGNOSIS — N186 End stage renal disease: Secondary | ICD-10-CM | POA: Diagnosis not present

## 2023-11-05 DIAGNOSIS — Z23 Encounter for immunization: Secondary | ICD-10-CM | POA: Diagnosis not present

## 2023-11-05 DIAGNOSIS — Z992 Dependence on renal dialysis: Secondary | ICD-10-CM | POA: Diagnosis not present

## 2023-11-05 DIAGNOSIS — D509 Iron deficiency anemia, unspecified: Secondary | ICD-10-CM | POA: Diagnosis not present

## 2023-11-05 DIAGNOSIS — N2581 Secondary hyperparathyroidism of renal origin: Secondary | ICD-10-CM | POA: Diagnosis not present

## 2023-11-05 DIAGNOSIS — D631 Anemia in chronic kidney disease: Secondary | ICD-10-CM | POA: Diagnosis not present

## 2023-11-07 ENCOUNTER — Ambulatory Visit: Admitting: Psychiatry

## 2023-11-08 ENCOUNTER — Other Ambulatory Visit
Admission: RE | Admit: 2023-11-08 | Discharge: 2023-11-08 | Disposition: A | Discharge status: Home / Self Care | Source: Other Acute Inpatient Hospital | Attending: Nephrology | Admitting: Nephrology

## 2023-11-08 DIAGNOSIS — R7889 Finding of other specified substances, not normally found in blood: Secondary | ICD-10-CM | POA: Diagnosis not present

## 2023-11-08 DIAGNOSIS — Z992 Dependence on renal dialysis: Secondary | ICD-10-CM | POA: Diagnosis not present

## 2023-11-08 DIAGNOSIS — N186 End stage renal disease: Secondary | ICD-10-CM | POA: Diagnosis not present

## 2023-11-08 DIAGNOSIS — D631 Anemia in chronic kidney disease: Secondary | ICD-10-CM | POA: Diagnosis not present

## 2023-11-08 DIAGNOSIS — D509 Iron deficiency anemia, unspecified: Secondary | ICD-10-CM | POA: Diagnosis not present

## 2023-11-08 DIAGNOSIS — Z23 Encounter for immunization: Secondary | ICD-10-CM | POA: Diagnosis not present

## 2023-11-08 DIAGNOSIS — N2581 Secondary hyperparathyroidism of renal origin: Secondary | ICD-10-CM | POA: Diagnosis not present

## 2023-11-08 LAB — LITHIUM LEVEL: Lithium Lvl: 0.65 mmol/L (ref 0.60–1.20)

## 2023-11-10 DIAGNOSIS — N186 End stage renal disease: Secondary | ICD-10-CM | POA: Diagnosis not present

## 2023-11-10 DIAGNOSIS — Z23 Encounter for immunization: Secondary | ICD-10-CM | POA: Diagnosis not present

## 2023-11-10 DIAGNOSIS — D631 Anemia in chronic kidney disease: Secondary | ICD-10-CM | POA: Diagnosis not present

## 2023-11-10 DIAGNOSIS — N2581 Secondary hyperparathyroidism of renal origin: Secondary | ICD-10-CM | POA: Diagnosis not present

## 2023-11-10 DIAGNOSIS — D509 Iron deficiency anemia, unspecified: Secondary | ICD-10-CM | POA: Diagnosis not present

## 2023-11-10 DIAGNOSIS — Z992 Dependence on renal dialysis: Secondary | ICD-10-CM | POA: Diagnosis not present

## 2023-11-11 ENCOUNTER — Telehealth: Payer: Self-pay | Admitting: Urology

## 2023-11-11 NOTE — Telephone Encounter (Signed)
 Would you call [name] and have him schedule an appointment in September?  It will be for a 45-month follow-up for his history of elevated PSA.  I would also like a free and total PSA done 1 week prior to his appointment.

## 2023-11-12 ENCOUNTER — Other Ambulatory Visit: Payer: Self-pay | Admitting: Psychiatry

## 2023-11-12 ENCOUNTER — Other Ambulatory Visit: Payer: Self-pay | Admitting: Physician Assistant

## 2023-11-12 DIAGNOSIS — Z23 Encounter for immunization: Secondary | ICD-10-CM | POA: Diagnosis not present

## 2023-11-12 DIAGNOSIS — N186 End stage renal disease: Secondary | ICD-10-CM | POA: Diagnosis not present

## 2023-11-12 DIAGNOSIS — G4701 Insomnia due to medical condition: Secondary | ICD-10-CM

## 2023-11-12 DIAGNOSIS — D631 Anemia in chronic kidney disease: Secondary | ICD-10-CM | POA: Diagnosis not present

## 2023-11-12 DIAGNOSIS — Z992 Dependence on renal dialysis: Secondary | ICD-10-CM | POA: Diagnosis not present

## 2023-11-12 DIAGNOSIS — F3175 Bipolar disorder, in partial remission, most recent episode depressed: Secondary | ICD-10-CM

## 2023-11-12 DIAGNOSIS — D509 Iron deficiency anemia, unspecified: Secondary | ICD-10-CM | POA: Diagnosis not present

## 2023-11-12 DIAGNOSIS — N2581 Secondary hyperparathyroidism of renal origin: Secondary | ICD-10-CM | POA: Diagnosis not present

## 2023-11-14 DIAGNOSIS — H26492 Other secondary cataract, left eye: Secondary | ICD-10-CM | POA: Diagnosis not present

## 2023-11-14 NOTE — Telephone Encounter (Signed)
 Requested Prescriptions  Refused Prescriptions Disp Refills   loratadine  (CLARITIN ) 10 MG tablet [Pharmacy Med Name: LORATADINE  10 MG TABLET] 90 tablet 1    Sig: TAKE 1 TABLET BY MOUTH EVERY DAY     Ear, Nose, and Throat:  Antihistamines 2 Failed - 11/14/2023  3:56 PM      Failed - Cr in normal range and within 360 days    Creatinine  Date Value Ref Range Status  03/08/2014 2.22 (H) 0.60 - 1.30 mg/dL Final   Creatinine, Ser  Date Value Ref Range Status  08/08/2023 7.41 (H) 0.76 - 1.27 mg/dL Final   Creatinine, Urine  Date Value Ref Range Status  08/01/2019 194 mg/dL Final    Comment:    Performed at [facility], [address], [place], [place] [address]         Passed - Valid encounter within last 12 months    Recent Outpatient Visits           3 months ago Bipolar disorder, in full remission, most recent episode depressed [facility])   [place] [facility] Luis Pao, NP   5 months ago Runny nose   [place] [facility] Luis Pao, NP   6 months ago Seasonal allergies   [place] [facility] Luis Pao, NP       Future Appointments             In 2 weeks [place], Luis Miller [facility] [place]

## 2023-11-15 DIAGNOSIS — Z992 Dependence on renal dialysis: Secondary | ICD-10-CM | POA: Diagnosis not present

## 2023-11-15 DIAGNOSIS — N186 End stage renal disease: Secondary | ICD-10-CM | POA: Diagnosis not present

## 2023-11-15 DIAGNOSIS — D509 Iron deficiency anemia, unspecified: Secondary | ICD-10-CM | POA: Diagnosis not present

## 2023-11-15 DIAGNOSIS — Z23 Encounter for immunization: Secondary | ICD-10-CM | POA: Diagnosis not present

## 2023-11-15 DIAGNOSIS — D631 Anemia in chronic kidney disease: Secondary | ICD-10-CM | POA: Diagnosis not present

## 2023-11-15 DIAGNOSIS — N2581 Secondary hyperparathyroidism of renal origin: Secondary | ICD-10-CM | POA: Diagnosis not present

## 2023-11-16 ENCOUNTER — Other Ambulatory Visit
Admission: RE | Admit: 2023-11-16 | Discharge: 2023-11-16 | Disposition: A | Discharge status: Home / Self Care | Source: Ambulatory Visit | Attending: Nephrology | Admitting: Nephrology

## 2023-11-16 DIAGNOSIS — C44729 Squamous cell carcinoma of skin of left lower limb, including hip: Secondary | ICD-10-CM | POA: Diagnosis not present

## 2023-11-16 DIAGNOSIS — C44629 Squamous cell carcinoma of skin of left upper limb, including shoulder: Secondary | ICD-10-CM | POA: Diagnosis not present

## 2023-11-16 DIAGNOSIS — R7889 Finding of other specified substances, not normally found in blood: Secondary | ICD-10-CM | POA: Insufficient documentation

## 2023-11-16 LAB — LITHIUM LEVEL: Lithium Lvl: 0.79 mmol/L (ref 0.60–1.20)

## 2023-11-17 DIAGNOSIS — D509 Iron deficiency anemia, unspecified: Secondary | ICD-10-CM | POA: Diagnosis not present

## 2023-11-17 DIAGNOSIS — N2581 Secondary hyperparathyroidism of renal origin: Secondary | ICD-10-CM | POA: Diagnosis not present

## 2023-11-17 DIAGNOSIS — Z23 Encounter for immunization: Secondary | ICD-10-CM | POA: Diagnosis not present

## 2023-11-17 DIAGNOSIS — D631 Anemia in chronic kidney disease: Secondary | ICD-10-CM | POA: Diagnosis not present

## 2023-11-17 DIAGNOSIS — N186 End stage renal disease: Secondary | ICD-10-CM | POA: Diagnosis not present

## 2023-11-17 DIAGNOSIS — Z992 Dependence on renal dialysis: Secondary | ICD-10-CM | POA: Diagnosis not present

## 2023-11-18 DIAGNOSIS — M25561 Pain in right knee: Secondary | ICD-10-CM | POA: Diagnosis not present

## 2023-11-18 DIAGNOSIS — M1711 Unilateral primary osteoarthritis, right knee: Secondary | ICD-10-CM | POA: Diagnosis not present

## 2023-11-18 DIAGNOSIS — G8929 Other chronic pain: Secondary | ICD-10-CM | POA: Diagnosis not present

## 2023-11-18 NOTE — Progress Notes (Signed)
 Large Joint Injection: R knee  Date/Time: 11/18/2023 10:00 AM  Performed by: Drake Fonda Loving, PA Authorized by: Drake Fonda Loving, PA   Procedure discussed: discussed risks, benefits, and alternatives   Consent Given by:  Patient Indications:  Pain Needle Size:  22 G Location:  Knee Site:  R knee Medications:  3 mL lidocaine  1 %; 30 mg sodium hyaluronate 30 mg/2 mL Patient tolerance:  Patient tolerated the procedure well Instructions: patient was counseled as to the expected post injection course, including the possibility of temporary worsening of symptoms   patient was instructed as to concerning symptoms or signs and instructed to contact the office if these should appear     Right knee Orthovisc injection Procedure:  The risks, benefits and alternatives of the proposed injection were discussed with patient.  The patient states that he understands these and he verbally consented to proceed.  The patient's right  Anterolateral region was cleaned with alcohol solution and then ethylchloride anesthetic spray was utilized. The patient was injected with 3 cc's of 1% lidocaine  for anesthetic effect, using a 25 gauge needle. Using a 22 gauge needle,  2 cc's of Orthovisc was injected into the patient's knee.   Patient tolerated this well without any complications.  He was counseled as to expected post injection course, including the possibility of temporary worsening of symptoms.  He was instructed as to concerning symptoms or signs, and was  instructed to contact our office if these should appear.

## 2023-11-19 DIAGNOSIS — D631 Anemia in chronic kidney disease: Secondary | ICD-10-CM | POA: Diagnosis not present

## 2023-11-19 DIAGNOSIS — D509 Iron deficiency anemia, unspecified: Secondary | ICD-10-CM | POA: Diagnosis not present

## 2023-11-19 DIAGNOSIS — N186 End stage renal disease: Secondary | ICD-10-CM | POA: Diagnosis not present

## 2023-11-19 DIAGNOSIS — Z992 Dependence on renal dialysis: Secondary | ICD-10-CM | POA: Diagnosis not present

## 2023-11-19 DIAGNOSIS — N2581 Secondary hyperparathyroidism of renal origin: Secondary | ICD-10-CM | POA: Diagnosis not present

## 2023-11-19 DIAGNOSIS — Z23 Encounter for immunization: Secondary | ICD-10-CM | POA: Diagnosis not present

## 2023-11-20 DIAGNOSIS — Z992 Dependence on renal dialysis: Secondary | ICD-10-CM | POA: Diagnosis not present

## 2023-11-20 DIAGNOSIS — N186 End stage renal disease: Secondary | ICD-10-CM | POA: Diagnosis not present

## 2023-11-22 ENCOUNTER — Other Ambulatory Visit
Admission: RE | Admit: 2023-11-22 | Discharge: 2023-11-22 | Disposition: A | Discharge status: Home / Self Care | Source: Ambulatory Visit | Attending: Nephrology | Admitting: Nephrology

## 2023-11-22 ENCOUNTER — Other Ambulatory Visit: Payer: Self-pay | Admitting: *Deleted

## 2023-11-22 DIAGNOSIS — N186 End stage renal disease: Secondary | ICD-10-CM | POA: Diagnosis not present

## 2023-11-22 DIAGNOSIS — N2581 Secondary hyperparathyroidism of renal origin: Secondary | ICD-10-CM | POA: Diagnosis not present

## 2023-11-22 DIAGNOSIS — D631 Anemia in chronic kidney disease: Secondary | ICD-10-CM | POA: Diagnosis not present

## 2023-11-22 DIAGNOSIS — R7889 Finding of other specified substances, not normally found in blood: Secondary | ICD-10-CM | POA: Diagnosis not present

## 2023-11-22 DIAGNOSIS — Z992 Dependence on renal dialysis: Secondary | ICD-10-CM | POA: Diagnosis not present

## 2023-11-22 DIAGNOSIS — R972 Elevated prostate specific antigen [PSA]: Secondary | ICD-10-CM

## 2023-11-22 DIAGNOSIS — D509 Iron deficiency anemia, unspecified: Secondary | ICD-10-CM | POA: Diagnosis not present

## 2023-11-22 DIAGNOSIS — N250 Renal osteodystrophy: Secondary | ICD-10-CM | POA: Diagnosis not present

## 2023-11-22 LAB — LITHIUM LEVEL: Lithium Lvl: 0.82 mmol/L (ref 0.60–1.20)

## 2023-11-23 ENCOUNTER — Other Ambulatory Visit

## 2023-11-23 DIAGNOSIS — R972 Elevated prostate specific antigen [PSA]: Secondary | ICD-10-CM | POA: Diagnosis not present

## 2023-11-24 DIAGNOSIS — N2581 Secondary hyperparathyroidism of renal origin: Secondary | ICD-10-CM | POA: Diagnosis not present

## 2023-11-24 DIAGNOSIS — D509 Iron deficiency anemia, unspecified: Secondary | ICD-10-CM | POA: Diagnosis not present

## 2023-11-24 DIAGNOSIS — N250 Renal osteodystrophy: Secondary | ICD-10-CM | POA: Diagnosis not present

## 2023-11-24 DIAGNOSIS — N186 End stage renal disease: Secondary | ICD-10-CM | POA: Diagnosis not present

## 2023-11-24 DIAGNOSIS — D631 Anemia in chronic kidney disease: Secondary | ICD-10-CM | POA: Diagnosis not present

## 2023-11-24 DIAGNOSIS — Z992 Dependence on renal dialysis: Secondary | ICD-10-CM | POA: Diagnosis not present

## 2023-11-24 LAB — PSA TOTAL (REFLEX TO FREE): Prostate Specific Ag, Serum: 8.9 ng/mL — ABNORMAL HIGH (ref 0.0–4.0)

## 2023-11-24 LAB — FPSA% REFLEX
% FREE PSA: 27.5 %
PSA, FREE: 2.45 ng/mL

## 2023-11-25 DIAGNOSIS — M1711 Unilateral primary osteoarthritis, right knee: Secondary | ICD-10-CM | POA: Diagnosis not present

## 2023-11-25 NOTE — Progress Notes (Signed)
 Large Joint Injection: R knee  Date/Time: 11/25/2023 10:00 AM  Performed by: Drake Fonda Loving, PA Authorized by: Drake Fonda Loving, PA   Location:  Knee Site:  R knee Medications:  3 mL lidocaine  1 %; 30 mg sodium hyaluronate 30 mg/2 mL   Right knee Orthovisc injection Procedure:   The risks, benefits and alternatives of the proposed injection were discussed with patient.  The patient states that he understands these and he verbally consented to proceed.  The patient's right  Anterolateral region was cleaned with alcohol solution and then ethylchloride anesthetic spray was utilized. The patient was injected with 3 cc's of 1% lidocaine  for anesthetic effect, using a 25 gauge needle. Using a 22 gauge needle,  2 cc's of Orthovisc was injected into the patient's knee.    Patient tolerated this well without any complications.  He was counseled as to expected post injection course, including the possibility of temporary worsening of symptoms.  He was instructed as to concerning symptoms or signs, and was  instructed to contact our office if these should appear.

## 2023-11-26 DIAGNOSIS — N250 Renal osteodystrophy: Secondary | ICD-10-CM | POA: Diagnosis not present

## 2023-11-26 DIAGNOSIS — N2581 Secondary hyperparathyroidism of renal origin: Secondary | ICD-10-CM | POA: Diagnosis not present

## 2023-11-26 DIAGNOSIS — Z992 Dependence on renal dialysis: Secondary | ICD-10-CM | POA: Diagnosis not present

## 2023-11-26 DIAGNOSIS — D509 Iron deficiency anemia, unspecified: Secondary | ICD-10-CM | POA: Diagnosis not present

## 2023-11-26 DIAGNOSIS — N186 End stage renal disease: Secondary | ICD-10-CM | POA: Diagnosis not present

## 2023-11-26 DIAGNOSIS — D631 Anemia in chronic kidney disease: Secondary | ICD-10-CM | POA: Diagnosis not present
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
## 2023-11-29 ENCOUNTER — Other Ambulatory Visit
Admission: RE | Admit: 2023-11-29 | Discharge: 2023-11-29 | Disposition: A | Discharge status: Home / Self Care | Source: Ambulatory Visit | Attending: Nephrology | Admitting: Nephrology

## 2023-11-29 DIAGNOSIS — D631 Anemia in chronic kidney disease: Secondary | ICD-10-CM | POA: Diagnosis not present

## 2023-11-29 DIAGNOSIS — N186 End stage renal disease: Secondary | ICD-10-CM | POA: Diagnosis not present

## 2023-11-29 DIAGNOSIS — R7889 Finding of other specified substances, not normally found in blood: Secondary | ICD-10-CM | POA: Diagnosis not present

## 2023-11-29 DIAGNOSIS — N250 Renal osteodystrophy: Secondary | ICD-10-CM | POA: Diagnosis not present

## 2023-11-29 DIAGNOSIS — Z992 Dependence on renal dialysis: Secondary | ICD-10-CM | POA: Diagnosis not present

## 2023-11-29 DIAGNOSIS — N2581 Secondary hyperparathyroidism of renal origin: Secondary | ICD-10-CM | POA: Diagnosis not present

## 2023-11-29 DIAGNOSIS — D509 Iron deficiency anemia, unspecified: Secondary | ICD-10-CM | POA: Diagnosis not present

## 2023-11-29 LAB — LITHIUM LEVEL: Lithium Lvl: 0.75 mmol/L (ref 0.60–1.20)

## 2023-11-30 ENCOUNTER — Ambulatory Visit (INDEPENDENT_AMBULATORY_CARE_PROVIDER_SITE_OTHER): Admitting: Urology

## 2023-11-30 ENCOUNTER — Encounter: Payer: Self-pay | Admitting: Urology

## 2023-11-30 VITALS — BP 118/73 | HR 81 | Ht 66.0 in | Wt 190.1 lb

## 2023-11-30 DIAGNOSIS — R319 Hematuria, unspecified: Secondary | ICD-10-CM

## 2023-11-30 DIAGNOSIS — N401 Enlarged prostate with lower urinary tract symptoms: Secondary | ICD-10-CM | POA: Diagnosis not present

## 2023-11-30 DIAGNOSIS — R972 Elevated prostate specific antigen [PSA]: Secondary | ICD-10-CM | POA: Diagnosis not present

## 2023-11-30 DIAGNOSIS — N529 Male erectile dysfunction, unspecified: Secondary | ICD-10-CM | POA: Diagnosis not present

## 2023-11-30 DIAGNOSIS — N138 Other obstructive and reflux uropathy: Secondary | ICD-10-CM

## 2023-12-01 ENCOUNTER — Telehealth: Payer: Self-pay | Admitting: Psychiatry

## 2023-12-01 DIAGNOSIS — N2581 Secondary hyperparathyroidism of renal origin: Secondary | ICD-10-CM | POA: Diagnosis not present

## 2023-12-01 DIAGNOSIS — N186 End stage renal disease: Secondary | ICD-10-CM | POA: Diagnosis not present

## 2023-12-01 DIAGNOSIS — D631 Anemia in chronic kidney disease: Secondary | ICD-10-CM | POA: Diagnosis not present

## 2023-12-01 DIAGNOSIS — D509 Iron deficiency anemia, unspecified: Secondary | ICD-10-CM | POA: Diagnosis not present

## 2023-12-01 DIAGNOSIS — N250 Renal osteodystrophy: Secondary | ICD-10-CM | POA: Diagnosis not present

## 2023-12-01 DIAGNOSIS — Z992 Dependence on renal dialysis: Secondary | ICD-10-CM | POA: Diagnosis not present

## 2023-12-01 NOTE — Telephone Encounter (Signed)
 I have reviewed lithium  level-dated 11/29/2023-0.75-therapeutic.  Received from [facility].

## 2023-12-02 DIAGNOSIS — M1711 Unilateral primary osteoarthritis, right knee: Secondary | ICD-10-CM | POA: Diagnosis not present

## 2023-12-03 DIAGNOSIS — N250 Renal osteodystrophy: Secondary | ICD-10-CM | POA: Diagnosis not present

## 2023-12-03 DIAGNOSIS — N2581 Secondary hyperparathyroidism of renal origin: Secondary | ICD-10-CM | POA: Diagnosis not present

## 2023-12-03 DIAGNOSIS — D509 Iron deficiency anemia, unspecified: Secondary | ICD-10-CM | POA: Diagnosis not present

## 2023-12-03 DIAGNOSIS — N186 End stage renal disease: Secondary | ICD-10-CM | POA: Diagnosis not present

## 2023-12-03 DIAGNOSIS — D631 Anemia in chronic kidney disease: Secondary | ICD-10-CM | POA: Diagnosis not present

## 2023-12-03 DIAGNOSIS — Z992 Dependence on renal dialysis: Secondary | ICD-10-CM | POA: Diagnosis not present

## 2023-12-05 ENCOUNTER — Other Ambulatory Visit: Payer: Self-pay | Admitting: Physician Assistant

## 2023-12-05 ENCOUNTER — Other Ambulatory Visit: Payer: Self-pay | Admitting: Psychiatry

## 2023-12-05 ENCOUNTER — Other Ambulatory Visit: Payer: Self-pay | Admitting: Nurse Practitioner

## 2023-12-05 DIAGNOSIS — F314 Bipolar disorder, current episode depressed, severe, without psychotic features: Secondary | ICD-10-CM

## 2023-12-05 DIAGNOSIS — G4701 Insomnia due to medical condition: Secondary | ICD-10-CM

## 2023-12-06 ENCOUNTER — Other Ambulatory Visit
Admission: RE | Admit: 2023-12-06 | Discharge: 2023-12-06 | Disposition: A | Discharge status: Home / Self Care | Source: Ambulatory Visit | Attending: Nephrology | Admitting: Nephrology

## 2023-12-06 DIAGNOSIS — N186 End stage renal disease: Secondary | ICD-10-CM | POA: Diagnosis not present

## 2023-12-06 DIAGNOSIS — D509 Iron deficiency anemia, unspecified: Secondary | ICD-10-CM | POA: Diagnosis not present

## 2023-12-06 DIAGNOSIS — R7889 Finding of other specified substances, not normally found in blood: Secondary | ICD-10-CM | POA: Diagnosis not present

## 2023-12-06 DIAGNOSIS — Z992 Dependence on renal dialysis: Secondary | ICD-10-CM | POA: Diagnosis not present

## 2023-12-06 DIAGNOSIS — N250 Renal osteodystrophy: Secondary | ICD-10-CM | POA: Diagnosis not present

## 2023-12-06 DIAGNOSIS — N2581 Secondary hyperparathyroidism of renal origin: Secondary | ICD-10-CM | POA: Diagnosis not present

## 2023-12-06 DIAGNOSIS — D631 Anemia in chronic kidney disease: Secondary | ICD-10-CM | POA: Diagnosis not present

## 2023-12-06 LAB — LITHIUM LEVEL: Lithium Lvl: 0.7 mmol/L (ref 0.60–1.20)

## 2023-12-06 NOTE — Telephone Encounter (Signed)
 Too soon for refill.  Requested Prescriptions  Pending Prescriptions Disp Refills   fluticasone  (FLONASE ) 50 MCG/ACT nasal spray [Pharmacy Med Name: FLUTICASONE  PROP 50 MCG SPRAY] 48 mL 2    Sig: SPRAY 2 SPRAYS INTO EACH NOSTRIL EVERY DAY     Ear, Nose, and Throat: Nasal Preparations - Corticosteroids Passed - 12/06/2023  3:22 PM      Passed - Valid encounter within last 12 months    Recent Outpatient Visits           4 months ago Bipolar disorder, in full remission, most recent episode depressed [facility])   [facility] [place] Melvin Pao, NP   6 months ago Runny nose   [facility] Melvin Pao, NP   7 months ago Seasonal allergies    [facility] Melvin Pao, NP

## 2023-12-08 DIAGNOSIS — N186 End stage renal disease: Secondary | ICD-10-CM | POA: Diagnosis not present

## 2023-12-08 DIAGNOSIS — N250 Renal osteodystrophy: Secondary | ICD-10-CM | POA: Diagnosis not present

## 2023-12-08 DIAGNOSIS — D631 Anemia in chronic kidney disease: Secondary | ICD-10-CM | POA: Diagnosis not present

## 2023-12-08 DIAGNOSIS — N2581 Secondary hyperparathyroidism of renal origin: Secondary | ICD-10-CM | POA: Diagnosis not present

## 2023-12-08 DIAGNOSIS — D509 Iron deficiency anemia, unspecified: Secondary | ICD-10-CM | POA: Diagnosis not present

## 2023-12-08 DIAGNOSIS — Z992 Dependence on renal dialysis: Secondary | ICD-10-CM | POA: Diagnosis not present

## 2023-12-10 DIAGNOSIS — N2581 Secondary hyperparathyroidism of renal origin: Secondary | ICD-10-CM | POA: Diagnosis not present

## 2023-12-10 DIAGNOSIS — N186 End stage renal disease: Secondary | ICD-10-CM | POA: Diagnosis not present

## 2023-12-10 DIAGNOSIS — Z992 Dependence on renal dialysis: Secondary | ICD-10-CM | POA: Diagnosis not present

## 2023-12-10 DIAGNOSIS — D631 Anemia in chronic kidney disease: Secondary | ICD-10-CM | POA: Diagnosis not present

## 2023-12-10 DIAGNOSIS — D509 Iron deficiency anemia, unspecified: Secondary | ICD-10-CM | POA: Diagnosis not present

## 2023-12-10 DIAGNOSIS — N250 Renal osteodystrophy: Secondary | ICD-10-CM | POA: Diagnosis not present

## 2023-12-12 DIAGNOSIS — E0590 Thyrotoxicosis, unspecified without thyrotoxic crisis or storm: Secondary | ICD-10-CM | POA: Diagnosis not present

## 2023-12-13 DIAGNOSIS — D631 Anemia in chronic kidney disease: Secondary | ICD-10-CM | POA: Diagnosis not present

## 2023-12-13 DIAGNOSIS — Z992 Dependence on renal dialysis: Secondary | ICD-10-CM | POA: Diagnosis not present

## 2023-12-13 DIAGNOSIS — D509 Iron deficiency anemia, unspecified: Secondary | ICD-10-CM | POA: Diagnosis not present

## 2023-12-13 DIAGNOSIS — N186 End stage renal disease: Secondary | ICD-10-CM | POA: Diagnosis not present

## 2023-12-13 DIAGNOSIS — N250 Renal osteodystrophy: Secondary | ICD-10-CM | POA: Diagnosis not present

## 2023-12-13 DIAGNOSIS — N2581 Secondary hyperparathyroidism of renal origin: Secondary | ICD-10-CM | POA: Diagnosis not present

## 2023-12-14 ENCOUNTER — Encounter: Payer: Self-pay | Admitting: Psychiatry

## 2023-12-14 ENCOUNTER — Telehealth (INDEPENDENT_AMBULATORY_CARE_PROVIDER_SITE_OTHER): Admitting: Psychiatry

## 2023-12-14 DIAGNOSIS — G3184 Mild cognitive impairment, so stated: Secondary | ICD-10-CM

## 2023-12-14 DIAGNOSIS — F3176 Bipolar disorder, in full remission, most recent episode depressed: Secondary | ICD-10-CM | POA: Diagnosis not present

## 2023-12-14 DIAGNOSIS — G4701 Insomnia due to medical condition: Secondary | ICD-10-CM | POA: Diagnosis not present

## 2023-12-14 MED ORDER — ESZOPICLONE 3 MG PO TABS: 3.0000 mg | ORAL_TABLET | Freq: Every day | ORAL | 1 refills | Status: DC

## 2023-12-14 MED ORDER — BUPROPION HCL 75 MG PO TABS: 75.0000 mg | ORAL_TABLET | Freq: Every morning | ORAL | 1 refills | Status: DC
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
## 2023-12-15 ENCOUNTER — Other Ambulatory Visit
Admission: RE | Admit: 2023-12-15 | Discharge: 2023-12-15 | Disposition: A | Discharge status: Home / Self Care | Source: Ambulatory Visit | Attending: Nephrology | Admitting: Nephrology

## 2023-12-15 DIAGNOSIS — D631 Anemia in chronic kidney disease: Secondary | ICD-10-CM | POA: Diagnosis not present

## 2023-12-15 DIAGNOSIS — Z992 Dependence on renal dialysis: Secondary | ICD-10-CM | POA: Diagnosis not present

## 2023-12-15 DIAGNOSIS — D509 Iron deficiency anemia, unspecified: Secondary | ICD-10-CM | POA: Diagnosis not present

## 2023-12-15 DIAGNOSIS — N2581 Secondary hyperparathyroidism of renal origin: Secondary | ICD-10-CM | POA: Diagnosis not present

## 2023-12-15 DIAGNOSIS — N186 End stage renal disease: Secondary | ICD-10-CM | POA: Diagnosis not present

## 2023-12-15 DIAGNOSIS — R7889 Finding of other specified substances, not normally found in blood: Secondary | ICD-10-CM | POA: Insufficient documentation

## 2023-12-15 DIAGNOSIS — N250 Renal osteodystrophy: Secondary | ICD-10-CM | POA: Diagnosis not present

## 2023-12-15 LAB — LITHIUM LEVEL: Lithium Lvl: 0.67 mmol/L (ref 0.60–1.20)

## 2023-12-17 DIAGNOSIS — N250 Renal osteodystrophy: Secondary | ICD-10-CM | POA: Diagnosis not present

## 2023-12-17 DIAGNOSIS — Z992 Dependence on renal dialysis: Secondary | ICD-10-CM | POA: Diagnosis not present

## 2023-12-17 DIAGNOSIS — D631 Anemia in chronic kidney disease: Secondary | ICD-10-CM | POA: Diagnosis not present

## 2023-12-17 DIAGNOSIS — N2581 Secondary hyperparathyroidism of renal origin: Secondary | ICD-10-CM | POA: Diagnosis not present

## 2023-12-17 DIAGNOSIS — N186 End stage renal disease: Secondary | ICD-10-CM | POA: Diagnosis not present

## 2023-12-17 DIAGNOSIS — D509 Iron deficiency anemia, unspecified: Secondary | ICD-10-CM | POA: Diagnosis not present

## 2023-12-19 DIAGNOSIS — Z8639 Personal history of other endocrine, nutritional and metabolic disease: Secondary | ICD-10-CM | POA: Diagnosis not present

## 2023-12-20 DIAGNOSIS — N250 Renal osteodystrophy: Secondary | ICD-10-CM | POA: Diagnosis not present

## 2023-12-20 DIAGNOSIS — N2581 Secondary hyperparathyroidism of renal origin: Secondary | ICD-10-CM | POA: Diagnosis not present

## 2023-12-20 DIAGNOSIS — D631 Anemia in chronic kidney disease: Secondary | ICD-10-CM | POA: Diagnosis not present

## 2023-12-20 DIAGNOSIS — Z992 Dependence on renal dialysis: Secondary | ICD-10-CM | POA: Diagnosis not present

## 2023-12-20 DIAGNOSIS — D509 Iron deficiency anemia, unspecified: Secondary | ICD-10-CM | POA: Diagnosis not present

## 2023-12-20 DIAGNOSIS — N186 End stage renal disease: Secondary | ICD-10-CM | POA: Diagnosis not present

## 2023-12-21 DIAGNOSIS — Z85828 Personal history of other malignant neoplasm of skin: Secondary | ICD-10-CM | POA: Diagnosis not present

## 2023-12-21 DIAGNOSIS — D2271 Melanocytic nevi of right lower limb, including hip: Secondary | ICD-10-CM | POA: Diagnosis not present

## 2023-12-21 DIAGNOSIS — C44219 Basal cell carcinoma of skin of left ear and external auricular canal: Secondary | ICD-10-CM | POA: Diagnosis not present

## 2023-12-21 DIAGNOSIS — D2261 Melanocytic nevi of right upper limb, including shoulder: Secondary | ICD-10-CM | POA: Diagnosis not present

## 2023-12-21 DIAGNOSIS — D2262 Melanocytic nevi of left upper limb, including shoulder: Secondary | ICD-10-CM | POA: Diagnosis not present

## 2023-12-21 DIAGNOSIS — D485 Neoplasm of uncertain behavior of skin: Secondary | ICD-10-CM | POA: Diagnosis not present

## 2023-12-21 DIAGNOSIS — L538 Other specified erythematous conditions: Secondary | ICD-10-CM | POA: Diagnosis not present

## 2023-12-21 DIAGNOSIS — L820 Inflamed seborrheic keratosis: Secondary | ICD-10-CM | POA: Diagnosis not present

## 2023-12-21 DIAGNOSIS — Z85820 Personal history of malignant melanoma of skin: Secondary | ICD-10-CM | POA: Diagnosis not present

## 2023-12-21 DIAGNOSIS — D2272 Melanocytic nevi of left lower limb, including hip: Secondary | ICD-10-CM | POA: Diagnosis not present

## 2023-12-22 ENCOUNTER — Other Ambulatory Visit
Admission: RE | Admit: 2023-12-22 | Discharge: 2023-12-22 | Disposition: A | Discharge status: Home / Self Care | Source: Ambulatory Visit | Attending: Nephrology | Admitting: Nephrology

## 2023-12-22 DIAGNOSIS — N186 End stage renal disease: Secondary | ICD-10-CM | POA: Diagnosis not present

## 2023-12-22 DIAGNOSIS — Z992 Dependence on renal dialysis: Secondary | ICD-10-CM | POA: Diagnosis not present

## 2023-12-22 DIAGNOSIS — R7889 Finding of other specified substances, not normally found in blood: Secondary | ICD-10-CM | POA: Insufficient documentation

## 2023-12-22 DIAGNOSIS — N2581 Secondary hyperparathyroidism of renal origin: Secondary | ICD-10-CM | POA: Diagnosis not present

## 2023-12-22 DIAGNOSIS — N250 Renal osteodystrophy: Secondary | ICD-10-CM | POA: Diagnosis not present

## 2023-12-22 DIAGNOSIS — D631 Anemia in chronic kidney disease: Secondary | ICD-10-CM | POA: Diagnosis not present

## 2023-12-22 DIAGNOSIS — D509 Iron deficiency anemia, unspecified: Secondary | ICD-10-CM | POA: Diagnosis not present

## 2023-12-22 LAB — LITHIUM LEVEL: Lithium Lvl: 0.71 mmol/L (ref 0.60–1.20)

## 2023-12-24 DIAGNOSIS — Z992 Dependence on renal dialysis: Secondary | ICD-10-CM | POA: Diagnosis not present

## 2023-12-24 DIAGNOSIS — N250 Renal osteodystrophy: Secondary | ICD-10-CM | POA: Diagnosis not present

## 2023-12-24 DIAGNOSIS — N2581 Secondary hyperparathyroidism of renal origin: Secondary | ICD-10-CM | POA: Diagnosis not present

## 2023-12-24 DIAGNOSIS — D631 Anemia in chronic kidney disease: Secondary | ICD-10-CM | POA: Diagnosis not present

## 2023-12-24 DIAGNOSIS — N186 End stage renal disease: Secondary | ICD-10-CM | POA: Diagnosis not present

## 2023-12-24 DIAGNOSIS — D509 Iron deficiency anemia, unspecified: Secondary | ICD-10-CM | POA: Diagnosis not present

## 2023-12-27 DIAGNOSIS — D631 Anemia in chronic kidney disease: Secondary | ICD-10-CM | POA: Diagnosis not present

## 2023-12-27 DIAGNOSIS — Z992 Dependence on renal dialysis: Secondary | ICD-10-CM | POA: Diagnosis not present

## 2023-12-27 DIAGNOSIS — N250 Renal osteodystrophy: Secondary | ICD-10-CM | POA: Diagnosis not present

## 2023-12-27 DIAGNOSIS — D509 Iron deficiency anemia, unspecified: Secondary | ICD-10-CM | POA: Diagnosis not present

## 2023-12-27 DIAGNOSIS — N2581 Secondary hyperparathyroidism of renal origin: Secondary | ICD-10-CM | POA: Diagnosis not present

## 2023-12-27 DIAGNOSIS — N186 End stage renal disease: Secondary | ICD-10-CM | POA: Diagnosis not present

## 2023-12-29 DIAGNOSIS — N250 Renal osteodystrophy: Secondary | ICD-10-CM | POA: Diagnosis not present

## 2023-12-29 DIAGNOSIS — N2581 Secondary hyperparathyroidism of renal origin: Secondary | ICD-10-CM | POA: Diagnosis not present

## 2023-12-29 DIAGNOSIS — D631 Anemia in chronic kidney disease: Secondary | ICD-10-CM | POA: Diagnosis not present

## 2023-12-29 DIAGNOSIS — D509 Iron deficiency anemia, unspecified: Secondary | ICD-10-CM | POA: Diagnosis not present

## 2023-12-29 DIAGNOSIS — Z992 Dependence on renal dialysis: Secondary | ICD-10-CM | POA: Diagnosis not present

## 2023-12-29 DIAGNOSIS — N186 End stage renal disease: Secondary | ICD-10-CM | POA: Diagnosis not present

## 2023-12-31 DIAGNOSIS — D631 Anemia in chronic kidney disease: Secondary | ICD-10-CM | POA: Diagnosis not present

## 2023-12-31 DIAGNOSIS — N2581 Secondary hyperparathyroidism of renal origin: Secondary | ICD-10-CM | POA: Diagnosis not present

## 2023-12-31 DIAGNOSIS — N250 Renal osteodystrophy: Secondary | ICD-10-CM | POA: Diagnosis not present

## 2023-12-31 DIAGNOSIS — D509 Iron deficiency anemia, unspecified: Secondary | ICD-10-CM | POA: Diagnosis not present

## 2023-12-31 DIAGNOSIS — N186 End stage renal disease: Secondary | ICD-10-CM | POA: Diagnosis not present

## 2023-12-31 DIAGNOSIS — Z992 Dependence on renal dialysis: Secondary | ICD-10-CM | POA: Diagnosis not present

## 2024-01-03 DIAGNOSIS — Z992 Dependence on renal dialysis: Secondary | ICD-10-CM | POA: Diagnosis not present

## 2024-01-03 DIAGNOSIS — D631 Anemia in chronic kidney disease: Secondary | ICD-10-CM | POA: Diagnosis not present

## 2024-01-03 DIAGNOSIS — N250 Renal osteodystrophy: Secondary | ICD-10-CM | POA: Diagnosis not present

## 2024-01-03 DIAGNOSIS — N186 End stage renal disease: Secondary | ICD-10-CM | POA: Diagnosis not present

## 2024-01-03 DIAGNOSIS — N2581 Secondary hyperparathyroidism of renal origin: Secondary | ICD-10-CM | POA: Diagnosis not present

## 2024-01-03 DIAGNOSIS — D509 Iron deficiency anemia, unspecified: Secondary | ICD-10-CM | POA: Diagnosis not present

## 2024-01-05 DIAGNOSIS — D631 Anemia in chronic kidney disease: Secondary | ICD-10-CM | POA: Diagnosis not present

## 2024-01-05 DIAGNOSIS — Z992 Dependence on renal dialysis: Secondary | ICD-10-CM | POA: Diagnosis not present

## 2024-01-05 DIAGNOSIS — N2581 Secondary hyperparathyroidism of renal origin: Secondary | ICD-10-CM | POA: Diagnosis not present

## 2024-01-05 DIAGNOSIS — N250 Renal osteodystrophy: Secondary | ICD-10-CM | POA: Diagnosis not present

## 2024-01-05 DIAGNOSIS — D509 Iron deficiency anemia, unspecified: Secondary | ICD-10-CM | POA: Diagnosis not present

## 2024-01-05 DIAGNOSIS — N186 End stage renal disease: Secondary | ICD-10-CM | POA: Diagnosis not present

## 2024-01-07 ENCOUNTER — Other Ambulatory Visit: Payer: Self-pay | Admitting: Psychiatry

## 2024-01-07 ENCOUNTER — Other Ambulatory Visit
Admission: RE | Admit: 2024-01-07 | Discharge: 2024-01-07 | Disposition: A | Source: Ambulatory Visit | Attending: Nephrology | Admitting: Nephrology

## 2024-01-07 DIAGNOSIS — N2581 Secondary hyperparathyroidism of renal origin: Secondary | ICD-10-CM | POA: Diagnosis not present

## 2024-01-07 DIAGNOSIS — N250 Renal osteodystrophy: Secondary | ICD-10-CM | POA: Diagnosis not present

## 2024-01-07 DIAGNOSIS — R7889 Finding of other specified substances, not normally found in blood: Secondary | ICD-10-CM | POA: Insufficient documentation

## 2024-01-07 DIAGNOSIS — D631 Anemia in chronic kidney disease: Secondary | ICD-10-CM | POA: Diagnosis not present

## 2024-01-07 DIAGNOSIS — D509 Iron deficiency anemia, unspecified: Secondary | ICD-10-CM | POA: Diagnosis not present

## 2024-01-07 DIAGNOSIS — Z992 Dependence on renal dialysis: Secondary | ICD-10-CM | POA: Diagnosis not present

## 2024-01-07 DIAGNOSIS — N186 End stage renal disease: Secondary | ICD-10-CM | POA: Diagnosis not present

## 2024-01-07 DIAGNOSIS — F3176 Bipolar disorder, in full remission, most recent episode depressed: Secondary | ICD-10-CM

## 2024-01-07 LAB — LITHIUM LEVEL: Lithium Lvl: 0.61 mmol/L (ref 0.60–1.20)

## 2024-01-08 ENCOUNTER — Other Ambulatory Visit: Payer: Self-pay | Admitting: Psychiatry

## 2024-01-08 ENCOUNTER — Other Ambulatory Visit: Payer: Self-pay | Admitting: Nurse Practitioner

## 2024-01-08 DIAGNOSIS — G4701 Insomnia due to medical condition: Secondary | ICD-10-CM

## 2024-01-08 DIAGNOSIS — F3176 Bipolar disorder, in full remission, most recent episode depressed: Secondary | ICD-10-CM

## 2024-01-10 DIAGNOSIS — N2581 Secondary hyperparathyroidism of renal origin: Secondary | ICD-10-CM | POA: Diagnosis not present

## 2024-01-10 DIAGNOSIS — Z992 Dependence on renal dialysis: Secondary | ICD-10-CM | POA: Diagnosis not present

## 2024-01-10 DIAGNOSIS — D631 Anemia in chronic kidney disease: Secondary | ICD-10-CM | POA: Diagnosis not present

## 2024-01-10 DIAGNOSIS — N186 End stage renal disease: Secondary | ICD-10-CM | POA: Diagnosis not present

## 2024-01-10 DIAGNOSIS — N250 Renal osteodystrophy: Secondary | ICD-10-CM | POA: Diagnosis not present

## 2024-01-10 DIAGNOSIS — D509 Iron deficiency anemia, unspecified: Secondary | ICD-10-CM | POA: Diagnosis not present

## 2024-01-10 NOTE — Telephone Encounter (Signed)
 Requested Prescriptions  Pending Prescriptions Disp Refills   fluticasone  (FLONASE ) 50 MCG/ACT nasal spray [Pharmacy Med Name: FLUTICASONE  PROP 50 MCG SPRAY] 48 mL 2    Sig: SPRAY 2 SPRAYS INTO EACH NOSTRIL EVERY DAY     Ear, Nose, and Throat: Nasal Preparations - Corticosteroids Passed - 01/10/2024  2:14 PM      Passed - Valid encounter within last 12 months    Recent Outpatient Visits           5 months ago Bipolar disorder, in full remission, most recent episode depressed   [place] [facility] Melvin Pao, NP   7 months ago Runny nose   [place] [facility] Melvin Pao, NP   8 months ago Seasonal allergies   [facility] Melvin Pao, NP

## 2024-01-12 ENCOUNTER — Telehealth: Payer: Self-pay | Admitting: Psychiatry

## 2024-01-12 DIAGNOSIS — N2581 Secondary hyperparathyroidism of renal origin: Secondary | ICD-10-CM | POA: Diagnosis not present

## 2024-01-12 DIAGNOSIS — D509 Iron deficiency anemia, unspecified: Secondary | ICD-10-CM | POA: Diagnosis not present

## 2024-01-12 DIAGNOSIS — D631 Anemia in chronic kidney disease: Secondary | ICD-10-CM | POA: Diagnosis not present

## 2024-01-12 DIAGNOSIS — N186 End stage renal disease: Secondary | ICD-10-CM | POA: Diagnosis not present

## 2024-01-12 DIAGNOSIS — N250 Renal osteodystrophy: Secondary | ICD-10-CM | POA: Diagnosis not present

## 2024-01-12 DIAGNOSIS — Z992 Dependence on renal dialysis: Secondary | ICD-10-CM | POA: Diagnosis not present

## 2024-01-12 NOTE — Telephone Encounter (Signed)
 Lithium  level-0.61-therapeutic dated 01/07/2024.  Received from [facility].

## 2024-01-14 DIAGNOSIS — D631 Anemia in chronic kidney disease: Secondary | ICD-10-CM | POA: Diagnosis not present

## 2024-01-14 DIAGNOSIS — D509 Iron deficiency anemia, unspecified: Secondary | ICD-10-CM | POA: Diagnosis not present

## 2024-01-14 DIAGNOSIS — Z992 Dependence on renal dialysis: Secondary | ICD-10-CM | POA: Diagnosis not present

## 2024-01-14 DIAGNOSIS — N250 Renal osteodystrophy: Secondary | ICD-10-CM | POA: Diagnosis not present

## 2024-01-14 DIAGNOSIS — N186 End stage renal disease: Secondary | ICD-10-CM | POA: Diagnosis not present

## 2024-01-14 DIAGNOSIS — N2581 Secondary hyperparathyroidism of renal origin: Secondary | ICD-10-CM | POA: Diagnosis not present

## 2024-01-17 ENCOUNTER — Other Ambulatory Visit
Admission: RE | Admit: 2024-01-17 | Discharge: 2024-01-17 | Disposition: A | Discharge status: Home / Self Care | Source: Ambulatory Visit | Attending: Nephrology | Admitting: Nephrology

## 2024-01-17 DIAGNOSIS — N186 End stage renal disease: Secondary | ICD-10-CM | POA: Diagnosis not present

## 2024-01-17 DIAGNOSIS — D631 Anemia in chronic kidney disease: Secondary | ICD-10-CM | POA: Diagnosis not present

## 2024-01-17 DIAGNOSIS — R7889 Finding of other specified substances, not normally found in blood: Secondary | ICD-10-CM | POA: Insufficient documentation

## 2024-01-17 DIAGNOSIS — Z992 Dependence on renal dialysis: Secondary | ICD-10-CM | POA: Diagnosis not present

## 2024-01-17 DIAGNOSIS — N2581 Secondary hyperparathyroidism of renal origin: Secondary | ICD-10-CM | POA: Diagnosis not present

## 2024-01-17 DIAGNOSIS — N250 Renal osteodystrophy: Secondary | ICD-10-CM | POA: Diagnosis not present

## 2024-01-17 DIAGNOSIS — D509 Iron deficiency anemia, unspecified: Secondary | ICD-10-CM | POA: Diagnosis not present

## 2024-01-17 LAB — LITHIUM LEVEL: Lithium Lvl: 0.72 mmol/L (ref 0.60–1.20)

## 2024-01-18 ENCOUNTER — Ambulatory Visit (INDEPENDENT_AMBULATORY_CARE_PROVIDER_SITE_OTHER): Admitting: Psychiatry

## 2024-01-18 ENCOUNTER — Other Ambulatory Visit: Payer: Self-pay

## 2024-01-18 ENCOUNTER — Telehealth: Payer: Self-pay

## 2024-01-18 ENCOUNTER — Encounter: Payer: Self-pay | Admitting: Psychiatry

## 2024-01-18 VITALS — BP 126/74 | HR 79 | Temp 97.8°F | Ht 66.0 in | Wt 191.6 lb

## 2024-01-18 DIAGNOSIS — G3184 Mild cognitive impairment, so stated: Secondary | ICD-10-CM

## 2024-01-18 DIAGNOSIS — G4701 Insomnia due to medical condition: Secondary | ICD-10-CM

## 2024-01-18 DIAGNOSIS — F3176 Bipolar disorder, in full remission, most recent episode depressed: Secondary | ICD-10-CM

## 2024-01-18 NOTE — Telephone Encounter (Signed)
 Patient came into office with a letter from his insurance from [facility] stating that his medication Eszopiclone  will no longer be covered on his  formulary drug plan beginning 03-22-2024 and the alternatives drugs that's listed the patient has already tried and failed which are zolipidem, doxepin , 3 mg, 6 mg, belsomra . Called patients insurance spoke to [place] she stated that the patient needs to call member services to discuss a plan that will cover the tier 4 drug. Called  patient to explain he had concerns of changing one thing and not being covered for something else I explained to him that  a representative would go over all the prescription drug coverages for him when he calls he voiced understanding
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
## 2024-01-19 DIAGNOSIS — N2581 Secondary hyperparathyroidism of renal origin: Secondary | ICD-10-CM | POA: Diagnosis not present

## 2024-01-19 DIAGNOSIS — N250 Renal osteodystrophy: Secondary | ICD-10-CM | POA: Diagnosis not present

## 2024-01-19 DIAGNOSIS — Z992 Dependence on renal dialysis: Secondary | ICD-10-CM | POA: Diagnosis not present

## 2024-01-19 DIAGNOSIS — D509 Iron deficiency anemia, unspecified: Secondary | ICD-10-CM | POA: Diagnosis not present

## 2024-01-19 DIAGNOSIS — N186 End stage renal disease: Secondary | ICD-10-CM | POA: Diagnosis not present

## 2024-01-19 DIAGNOSIS — D631 Anemia in chronic kidney disease: Secondary | ICD-10-CM | POA: Diagnosis not present

## 2024-01-20 DIAGNOSIS — Z992 Dependence on renal dialysis: Secondary | ICD-10-CM | POA: Diagnosis not present

## 2024-01-20 DIAGNOSIS — M1711 Unilateral primary osteoarthritis, right knee: Secondary | ICD-10-CM | POA: Diagnosis not present

## 2024-01-20 DIAGNOSIS — N186 End stage renal disease: Secondary | ICD-10-CM | POA: Diagnosis not present

## 2024-01-20 NOTE — Progress Notes (Signed)
 Large Joint Injection: R knee  Date/Time: 01/20/2024 8:45 AM  Performed by: Drake Fonda Loving, PA Authorized by: Drake Fonda Loving, PA   Location:  Knee Site:  R knee Medications:  2 mL BUPivacaine  HCl 0.25 %; 2 mL lidocaine  1 %; 40 mg triamcinolone  acetonide 40 mg/mL   Right Knee Injection  The risks, benefits, and alternatives of the proposed injection were discussed with patient.  The patient states that he understands these and he verbally consents to proceed.  A surgical time out was performed.  The patient's right lateral joint line was cleaned with an alcohol prep.  Topical skin anesthesia was obtained using ethyl chloride spray.  Using aseptic technique, the knee was injected with a solution of 1 cc of Kenalog -40, 2 cc of 0.25% Marcaine  and 2cc of 1% Xylocaine .  The patient tolerated the procedure well and without any complications.  The patient was counseled as to the expected post-injection course, including the possibility of temporary worsening of symptoms.  He also was counseled as to concerning symptoms or signs, and was instructed to contact our office if any of these should develop.

## 2024-01-21 DIAGNOSIS — Z992 Dependence on renal dialysis: Secondary | ICD-10-CM | POA: Diagnosis not present

## 2024-01-21 DIAGNOSIS — N250 Renal osteodystrophy: Secondary | ICD-10-CM | POA: Diagnosis not present

## 2024-01-21 DIAGNOSIS — D509 Iron deficiency anemia, unspecified: Secondary | ICD-10-CM | POA: Diagnosis not present

## 2024-01-21 DIAGNOSIS — N186 End stage renal disease: Secondary | ICD-10-CM | POA: Diagnosis not present

## 2024-01-21 DIAGNOSIS — N2581 Secondary hyperparathyroidism of renal origin: Secondary | ICD-10-CM | POA: Diagnosis not present

## 2024-01-21 DIAGNOSIS — D631 Anemia in chronic kidney disease: Secondary | ICD-10-CM | POA: Diagnosis not present

## 2024-01-24 DIAGNOSIS — D509 Iron deficiency anemia, unspecified: Secondary | ICD-10-CM | POA: Diagnosis not present

## 2024-01-24 DIAGNOSIS — N2581 Secondary hyperparathyroidism of renal origin: Secondary | ICD-10-CM | POA: Diagnosis not present

## 2024-01-24 DIAGNOSIS — N186 End stage renal disease: Secondary | ICD-10-CM | POA: Diagnosis not present

## 2024-01-24 DIAGNOSIS — Z992 Dependence on renal dialysis: Secondary | ICD-10-CM | POA: Diagnosis not present

## 2024-01-24 DIAGNOSIS — N250 Renal osteodystrophy: Secondary | ICD-10-CM | POA: Diagnosis not present

## 2024-01-24 DIAGNOSIS — D631 Anemia in chronic kidney disease: Secondary | ICD-10-CM | POA: Diagnosis not present

## 2024-01-26 DIAGNOSIS — N2581 Secondary hyperparathyroidism of renal origin: Secondary | ICD-10-CM | POA: Diagnosis not present

## 2024-01-26 DIAGNOSIS — D509 Iron deficiency anemia, unspecified: Secondary | ICD-10-CM | POA: Diagnosis not present

## 2024-01-26 DIAGNOSIS — N186 End stage renal disease: Secondary | ICD-10-CM | POA: Diagnosis not present

## 2024-01-26 DIAGNOSIS — Z992 Dependence on renal dialysis: Secondary | ICD-10-CM | POA: Diagnosis not present

## 2024-01-26 DIAGNOSIS — D631 Anemia in chronic kidney disease: Secondary | ICD-10-CM | POA: Diagnosis not present

## 2024-01-26 DIAGNOSIS — N250 Renal osteodystrophy: Secondary | ICD-10-CM | POA: Diagnosis not present

## 2024-01-27 ENCOUNTER — Other Ambulatory Visit
Admission: RE | Admit: 2024-01-27 | Discharge: 2024-01-27 | Disposition: A | Discharge status: Home / Self Care | Source: Ambulatory Visit | Attending: Nephrology | Admitting: Nephrology

## 2024-01-27 DIAGNOSIS — R7889 Finding of other specified substances, not normally found in blood: Secondary | ICD-10-CM | POA: Insufficient documentation

## 2024-01-27 LAB — LITHIUM LEVEL: Lithium Lvl: 0.77 mmol/L (ref 0.60–1.20)

## 2024-01-28 DIAGNOSIS — N2581 Secondary hyperparathyroidism of renal origin: Secondary | ICD-10-CM | POA: Diagnosis not present

## 2024-01-28 DIAGNOSIS — D509 Iron deficiency anemia, unspecified: Secondary | ICD-10-CM | POA: Diagnosis not present

## 2024-01-28 DIAGNOSIS — N250 Renal osteodystrophy: Secondary | ICD-10-CM | POA: Diagnosis not present

## 2024-01-28 DIAGNOSIS — N186 End stage renal disease: Secondary | ICD-10-CM | POA: Diagnosis not present

## 2024-01-28 DIAGNOSIS — Z992 Dependence on renal dialysis: Secondary | ICD-10-CM | POA: Diagnosis not present

## 2024-01-28 DIAGNOSIS — D631 Anemia in chronic kidney disease: Secondary | ICD-10-CM | POA: Diagnosis not present

## 2024-01-31 DIAGNOSIS — D509 Iron deficiency anemia, unspecified: Secondary | ICD-10-CM | POA: Diagnosis not present

## 2024-01-31 DIAGNOSIS — Z992 Dependence on renal dialysis: Secondary | ICD-10-CM | POA: Diagnosis not present

## 2024-01-31 DIAGNOSIS — N2581 Secondary hyperparathyroidism of renal origin: Secondary | ICD-10-CM | POA: Diagnosis not present

## 2024-01-31 DIAGNOSIS — D631 Anemia in chronic kidney disease: Secondary | ICD-10-CM | POA: Diagnosis not present

## 2024-01-31 DIAGNOSIS — N186 End stage renal disease: Secondary | ICD-10-CM | POA: Diagnosis not present

## 2024-01-31 DIAGNOSIS — N250 Renal osteodystrophy: Secondary | ICD-10-CM | POA: Diagnosis not present

## 2024-02-02 ENCOUNTER — Other Ambulatory Visit
Admission: RE | Admit: 2024-02-02 | Discharge: 2024-02-02 | Disposition: A | Discharge status: Home / Self Care | Source: Ambulatory Visit | Attending: Nephrology | Admitting: Nephrology

## 2024-02-02 DIAGNOSIS — R7889 Finding of other specified substances, not normally found in blood: Secondary | ICD-10-CM | POA: Insufficient documentation

## 2024-02-02 DIAGNOSIS — N2581 Secondary hyperparathyroidism of renal origin: Secondary | ICD-10-CM | POA: Diagnosis not present

## 2024-02-02 DIAGNOSIS — N250 Renal osteodystrophy: Secondary | ICD-10-CM | POA: Diagnosis not present

## 2024-02-02 DIAGNOSIS — D631 Anemia in chronic kidney disease: Secondary | ICD-10-CM | POA: Diagnosis not present

## 2024-02-02 DIAGNOSIS — N186 End stage renal disease: Secondary | ICD-10-CM | POA: Diagnosis not present

## 2024-02-02 DIAGNOSIS — Z992 Dependence on renal dialysis: Secondary | ICD-10-CM | POA: Diagnosis not present

## 2024-02-02 DIAGNOSIS — D509 Iron deficiency anemia, unspecified: Secondary | ICD-10-CM | POA: Diagnosis not present

## 2024-02-02 LAB — LITHIUM LEVEL: Lithium Lvl: 0.78 mmol/L (ref 0.60–1.20)

## 2024-02-04 DIAGNOSIS — N186 End stage renal disease: Secondary | ICD-10-CM | POA: Diagnosis not present

## 2024-02-04 DIAGNOSIS — N250 Renal osteodystrophy: Secondary | ICD-10-CM | POA: Diagnosis not present

## 2024-02-04 DIAGNOSIS — D631 Anemia in chronic kidney disease: Secondary | ICD-10-CM | POA: Diagnosis not present

## 2024-02-04 DIAGNOSIS — N2581 Secondary hyperparathyroidism of renal origin: Secondary | ICD-10-CM | POA: Diagnosis not present

## 2024-02-04 DIAGNOSIS — D509 Iron deficiency anemia, unspecified: Secondary | ICD-10-CM | POA: Diagnosis not present

## 2024-02-04 DIAGNOSIS — Z992 Dependence on renal dialysis: Secondary | ICD-10-CM | POA: Diagnosis not present

## 2024-02-07 ENCOUNTER — Other Ambulatory Visit
Admission: RE | Admit: 2024-02-07 | Discharge: 2024-02-07 | Disposition: A | Discharge status: Home / Self Care | Source: Ambulatory Visit | Attending: Nephrology | Admitting: Nephrology

## 2024-02-07 DIAGNOSIS — D631 Anemia in chronic kidney disease: Secondary | ICD-10-CM | POA: Diagnosis not present

## 2024-02-07 DIAGNOSIS — Z992 Dependence on renal dialysis: Secondary | ICD-10-CM | POA: Diagnosis not present

## 2024-02-07 DIAGNOSIS — N2581 Secondary hyperparathyroidism of renal origin: Secondary | ICD-10-CM | POA: Diagnosis not present

## 2024-02-07 DIAGNOSIS — R7889 Finding of other specified substances, not normally found in blood: Secondary | ICD-10-CM | POA: Diagnosis not present

## 2024-02-07 DIAGNOSIS — N250 Renal osteodystrophy: Secondary | ICD-10-CM | POA: Diagnosis not present

## 2024-02-07 DIAGNOSIS — D509 Iron deficiency anemia, unspecified: Secondary | ICD-10-CM | POA: Diagnosis not present

## 2024-02-07 DIAGNOSIS — N186 End stage renal disease: Secondary | ICD-10-CM | POA: Diagnosis not present

## 2024-02-07 LAB — LITHIUM LEVEL: Lithium Lvl: 0.78 mmol/L (ref 0.60–1.20)

## 2024-02-09 ENCOUNTER — Telehealth: Payer: Self-pay | Admitting: Psychiatry

## 2024-02-09 DIAGNOSIS — N186 End stage renal disease: Secondary | ICD-10-CM | POA: Diagnosis not present

## 2024-02-09 DIAGNOSIS — D631 Anemia in chronic kidney disease: Secondary | ICD-10-CM | POA: Diagnosis not present

## 2024-02-09 DIAGNOSIS — N2581 Secondary hyperparathyroidism of renal origin: Secondary | ICD-10-CM | POA: Diagnosis not present

## 2024-02-09 DIAGNOSIS — Z992 Dependence on renal dialysis: Secondary | ICD-10-CM | POA: Diagnosis not present

## 2024-02-09 DIAGNOSIS — D509 Iron deficiency anemia, unspecified: Secondary | ICD-10-CM | POA: Diagnosis not present

## 2024-02-09 DIAGNOSIS — N250 Renal osteodystrophy: Secondary | ICD-10-CM | POA: Diagnosis not present

## 2024-02-09 NOTE — Telephone Encounter (Signed)
 I have reviewed lithium  level received from [facility] -dated 02/07/2024-0.78 therapeutic.

## 2024-02-11 DIAGNOSIS — D509 Iron deficiency anemia, unspecified: Secondary | ICD-10-CM | POA: Diagnosis not present

## 2024-02-11 DIAGNOSIS — D631 Anemia in chronic kidney disease: Secondary | ICD-10-CM | POA: Diagnosis not present

## 2024-02-11 DIAGNOSIS — N2581 Secondary hyperparathyroidism of renal origin: Secondary | ICD-10-CM | POA: Diagnosis not present

## 2024-02-11 DIAGNOSIS — N250 Renal osteodystrophy: Secondary | ICD-10-CM | POA: Diagnosis not present

## 2024-02-11 DIAGNOSIS — Z992 Dependence on renal dialysis: Secondary | ICD-10-CM | POA: Diagnosis not present

## 2024-02-11 DIAGNOSIS — N186 End stage renal disease: Secondary | ICD-10-CM | POA: Diagnosis not present

## 2024-02-13 ENCOUNTER — Telehealth: Payer: Self-pay | Admitting: Psychiatry

## 2024-02-13 ENCOUNTER — Other Ambulatory Visit
Admission: RE | Admit: 2024-02-13 | Discharge: 2024-02-13 | Disposition: A | Discharge status: Home / Self Care | Source: Ambulatory Visit | Attending: Nephrology | Admitting: Nephrology

## 2024-02-13 ENCOUNTER — Ambulatory Visit: Admitting: Nurse Practitioner

## 2024-02-13 DIAGNOSIS — R7889 Finding of other specified substances, not normally found in blood: Secondary | ICD-10-CM | POA: Insufficient documentation

## 2024-02-13 DIAGNOSIS — Z992 Dependence on renal dialysis: Secondary | ICD-10-CM | POA: Diagnosis not present

## 2024-02-13 DIAGNOSIS — N250 Renal osteodystrophy: Secondary | ICD-10-CM | POA: Diagnosis not present

## 2024-02-13 DIAGNOSIS — D631 Anemia in chronic kidney disease: Secondary | ICD-10-CM | POA: Diagnosis not present

## 2024-02-13 DIAGNOSIS — N2581 Secondary hyperparathyroidism of renal origin: Secondary | ICD-10-CM | POA: Diagnosis not present

## 2024-02-13 DIAGNOSIS — G4701 Insomnia due to medical condition: Secondary | ICD-10-CM

## 2024-02-13 DIAGNOSIS — N186 End stage renal disease: Secondary | ICD-10-CM | POA: Diagnosis not present

## 2024-02-13 DIAGNOSIS — D509 Iron deficiency anemia, unspecified: Secondary | ICD-10-CM | POA: Diagnosis not present

## 2024-02-13 LAB — LITHIUM LEVEL: Lithium Lvl: 0.67 mmol/L (ref 0.60–1.20)

## 2024-02-13 MED ORDER — ESZOPICLONE 3 MG PO TABS: 3.0000 mg | ORAL_TABLET | Freq: Every day | ORAL | 0 refills | Status: DC

## 2024-02-13 NOTE — Progress Notes (Deleted)
 There were no vitals taken for this visit.   Subjective:    Patient ID: Luis ONEIDA Launie Mickey., male    DOB: 12/10/1945, 78 y.o.   MRN: [id number]  HPI: Luis Desroches. is a 78 y.o. male  No chief complaint on file.  HYPERTENSION / HYPERLIPIDEMIA No longer taking any blood pressure medications.   Satisfied with current treatment? no Duration of hypertension: years BP monitoring frequency: not checking BP range:  BP medication side effects: no Past BP meds:  Duration of hyperlipidemia: years Cholesterol medication side effects: no Cholesterol supplements: none Past cholesterol medications: rosuvastatin  (crestor ) Medication compliance: excellent compliance Aspirin: no Recent stressors: no Recurrent headaches: no Visual changes: no Palpitations: no Dyspnea: no Chest pain: no Lower extremity edema: no Dizzy/lightheaded: no  CHRONIC KIDNEY DISEASE On Hemodialysis- doing well with HD. CKD status: controlled Medications renally dose: yes Previous renal evaluation: yes Pneumovax:  Up to Date Influenza Vaccine:  Not up to Date  SLEEP APNEA Endorses 100% use. Sleep apnea status: controlled Duration: chronic Satisfied with current treatment?:  yes CPAP use:  yes   Mood has been improving.  He is working with Dr. Eappen on his medications.  He is sleeping better but he is waking up too early in the morning.  He is on Lunesta . States he isn't getting more than 4 hours of sleep per night.      Relevant past medical, surgical, family and social history reviewed and updated as indicated. Interim medical history since our last visit reviewed. Allergies and medications reviewed and updated.  Review of Systems  Eyes:  Negative for visual disturbance.  Respiratory:  Negative for shortness of breath.   Cardiovascular:  Negative for chest pain and leg swelling.  Neurological:  Negative for light-headedness and headaches.  Psychiatric/Behavioral:  Positive for dysphoric mood  and sleep disturbance. Negative for suicidal ideas. The patient is not nervous/anxious.     Per HPI unless specifically indicated above     Objective:     There were no vitals taken for this visit.  Wt Readings from Last 3 Encounters:  11/30/23 190 lb 1.6 oz (86.2 kg)  08/08/23 186 lb (84.4 kg)  05/23/23 183 lb 9.6 oz (83.3 kg)    Physical Exam Vitals and nursing note reviewed.  Constitutional:      General: He is not in acute distress.    Appearance: Normal appearance. He is not ill-appearing, toxic-appearing or diaphoretic.  HENT:     Head: Normocephalic.     Right Ear: External ear normal.     Left Ear: External ear normal.     Nose: Nose normal. No congestion or rhinorrhea.     Mouth/Throat:     Mouth: Mucous membranes are moist.  Eyes:     General:        Right eye: No discharge.        Left eye: No discharge.     Extraocular Movements: Extraocular movements intact.     Conjunctiva/sclera: Conjunctivae normal.     Pupils: Pupils are equal, round, and reactive to light.  Cardiovascular:     Rate and Rhythm: Normal rate and regular rhythm.     Heart sounds: No murmur heard. Pulmonary:     Effort: Pulmonary effort is normal. No respiratory distress.     Breath sounds: Normal breath sounds. No wheezing, rhonchi or rales.  Abdominal:     General: Abdomen is flat. Bowel sounds are normal.  Musculoskeletal:     Cervical  back: Normal range of motion and neck supple.  Skin:    General: Skin is warm and dry.     Capillary Refill: Capillary refill takes less than 2 seconds.  Neurological:     General: No focal deficit present.     Mental Status: He is alert and oriented to person, place, and time.  Psychiatric:        Mood and Affect: Mood normal.        Behavior: Behavior normal.        Thought Content: Thought content normal.        Judgment: Judgment normal.     Results for orders placed or performed during the hospital encounter of 02/07/24  Lithium  level    Collection Time: 02/07/24 11:00 AM  Result Value Ref Range   Lithium  Lvl 0.78 0.60 - 1.20 mmol/L      Assessment & Plan:   Problem List Items Addressed This Visit       Cardiovascular and Mediastinum   Hypertension associated with diabetes (HCC)     Genitourinary   ESRD on hemodialysis (HCC)     Other   Hyperlipidemia   Bipolar I disorder, most recent episode depressed (HCC)   Prediabetes - Primary     Follow up plan: No follow-ups on file.

## 2024-02-13 NOTE — Telephone Encounter (Signed)
 Lithium  is not due for a refill on our end.  A prescription was sent out on 12/05/2023-90-day supply with a refill.  Patient needs to contact his pharmacy.  Lunesta  was sent out for 60-day supply per patient request.

## 2024-02-13 NOTE — Telephone Encounter (Signed)
 Patient states he is out of Lithium  carbonate 150 mg, and the Eszopiclone  3mg . [facility] [place] [facility] as you can since he is out

## 2024-02-14 DIAGNOSIS — S6992XA Unspecified injury of left wrist, hand and finger(s), initial encounter: Secondary | ICD-10-CM | POA: Diagnosis not present

## 2024-02-14 DIAGNOSIS — M20012 Mallet finger of left finger(s): Secondary | ICD-10-CM | POA: Diagnosis not present

## 2024-02-14 NOTE — Telephone Encounter (Signed)
 Noted

## 2024-02-14 NOTE — Telephone Encounter (Signed)
 Patient was able to pick up the Lithium  today

## 2024-02-15 DIAGNOSIS — N2581 Secondary hyperparathyroidism of renal origin: Secondary | ICD-10-CM | POA: Diagnosis not present

## 2024-02-15 DIAGNOSIS — Z992 Dependence on renal dialysis: Secondary | ICD-10-CM | POA: Diagnosis not present

## 2024-02-15 DIAGNOSIS — D631 Anemia in chronic kidney disease: Secondary | ICD-10-CM | POA: Diagnosis not present

## 2024-02-15 DIAGNOSIS — N186 End stage renal disease: Secondary | ICD-10-CM | POA: Diagnosis not present

## 2024-02-15 DIAGNOSIS — N250 Renal osteodystrophy: Secondary | ICD-10-CM | POA: Diagnosis not present

## 2024-02-15 DIAGNOSIS — D509 Iron deficiency anemia, unspecified: Secondary | ICD-10-CM | POA: Diagnosis not present

## 2024-02-18 DIAGNOSIS — N250 Renal osteodystrophy: Secondary | ICD-10-CM | POA: Diagnosis not present

## 2024-02-18 DIAGNOSIS — N186 End stage renal disease: Secondary | ICD-10-CM | POA: Diagnosis not present

## 2024-02-18 DIAGNOSIS — Z992 Dependence on renal dialysis: Secondary | ICD-10-CM | POA: Diagnosis not present

## 2024-02-18 DIAGNOSIS — D631 Anemia in chronic kidney disease: Secondary | ICD-10-CM | POA: Diagnosis not present

## 2024-02-18 DIAGNOSIS — D509 Iron deficiency anemia, unspecified: Secondary | ICD-10-CM | POA: Diagnosis not present

## 2024-02-18 DIAGNOSIS — N2581 Secondary hyperparathyroidism of renal origin: Secondary | ICD-10-CM | POA: Diagnosis not present

## 2024-02-19 DIAGNOSIS — N186 End stage renal disease: Secondary | ICD-10-CM | POA: Diagnosis not present

## 2024-02-19 DIAGNOSIS — Z992 Dependence on renal dialysis: Secondary | ICD-10-CM | POA: Diagnosis not present

## 2024-02-23 ENCOUNTER — Other Ambulatory Visit
Admission: RE | Admit: 2024-02-23 | Discharge: 2024-02-23 | Disposition: A | Source: Ambulatory Visit | Attending: Nephrology | Admitting: Nephrology

## 2024-02-23 DIAGNOSIS — R7889 Finding of other specified substances, not normally found in blood: Secondary | ICD-10-CM | POA: Diagnosis not present

## 2024-02-23 LAB — LITHIUM LEVEL: Lithium Lvl: 0.81 mmol/L (ref 0.60–1.20)

## 2024-02-28 DIAGNOSIS — Z992 Dependence on renal dialysis: Secondary | ICD-10-CM | POA: Diagnosis not present

## 2024-02-29 DIAGNOSIS — R413 Other amnesia: Secondary | ICD-10-CM | POA: Diagnosis not present

## 2024-02-29 DIAGNOSIS — C44219 Basal cell carcinoma of skin of left ear and external auricular canal: Secondary | ICD-10-CM | POA: Diagnosis not present

## 2024-02-29 DIAGNOSIS — M542 Cervicalgia: Secondary | ICD-10-CM | POA: Diagnosis not present

## 2024-02-29 DIAGNOSIS — Z992 Dependence on renal dialysis: Secondary | ICD-10-CM | POA: Diagnosis not present

## 2024-02-29 DIAGNOSIS — N186 End stage renal disease: Secondary | ICD-10-CM | POA: Diagnosis not present

## 2024-02-29 DIAGNOSIS — R2689 Other abnormalities of gait and mobility: Secondary | ICD-10-CM | POA: Diagnosis not present

## 2024-02-29 DIAGNOSIS — R251 Tremor, unspecified: Secondary | ICD-10-CM | POA: Diagnosis not present

## 2024-03-01 ENCOUNTER — Telehealth: Payer: Self-pay | Admitting: Psychiatry

## 2024-03-01 ENCOUNTER — Other Ambulatory Visit
Admission: RE | Admit: 2024-03-01 | Discharge: 2024-03-01 | Disposition: A | Discharge status: Home / Self Care | Source: Ambulatory Visit | Attending: Nephrology | Admitting: Nephrology

## 2024-03-01 DIAGNOSIS — R7889 Finding of other specified substances, not normally found in blood: Secondary | ICD-10-CM | POA: Diagnosis present

## 2024-03-01 LAB — LITHIUM LEVEL: Lithium Lvl: 0.7 mmol/L (ref 0.60–1.20)

## 2024-03-01 NOTE — Telephone Encounter (Signed)
 I have reviewed lithium  level received from [facility] dated 02/23/2024-0.81-therapeutic.

## 2024-03-06 ENCOUNTER — Other Ambulatory Visit
Admission: RE | Admit: 2024-03-06 | Discharge: 2024-03-06 | Disposition: A | Source: Ambulatory Visit | Attending: Nephrology | Admitting: Nephrology

## 2024-03-06 DIAGNOSIS — R7889 Finding of other specified substances, not normally found in blood: Secondary | ICD-10-CM | POA: Insufficient documentation

## 2024-03-06 LAB — LITHIUM LEVEL: Lithium Lvl: 0.78 mmol/L (ref 0.60–1.20)

## 2024-03-08 ENCOUNTER — Telehealth: Payer: Self-pay | Admitting: Psychiatry

## 2024-03-08 NOTE — Telephone Encounter (Signed)
 I have reviewed lithium  level dated 03/01/2024-0.70-therapeutic.

## 2024-03-13 ENCOUNTER — Other Ambulatory Visit
Admission: RE | Admit: 2024-03-13 | Discharge: 2024-03-13 | Disposition: A | Discharge status: Home / Self Care | Source: Other Acute Inpatient Hospital | Attending: Nephrology | Admitting: Nephrology

## 2024-03-13 DIAGNOSIS — R7889 Finding of other specified substances, not normally found in blood: Secondary | ICD-10-CM | POA: Insufficient documentation

## 2024-03-13 LAB — LITHIUM LEVEL: Lithium Lvl: 0.96 mmol/L (ref 0.60–1.20)

## 2024-03-20 ENCOUNTER — Telehealth: Payer: Self-pay

## 2024-03-20 DIAGNOSIS — G4701 Insomnia due to medical condition: Secondary | ICD-10-CM

## 2024-03-20 DIAGNOSIS — F3176 Bipolar disorder, in full remission, most recent episode depressed: Secondary | ICD-10-CM

## 2024-03-20 MED ORDER — OLANZAPINE 2.5 MG PO TABS: 2.5000 mg | ORAL_TABLET | Freq: Every day | ORAL | 1 refills | Status: AC

## 2024-03-20 NOTE — Telephone Encounter (Signed)
 I have sent olanzapine  as requested to pharmacy.

## 2024-03-20 NOTE — Addendum Note (Signed)
 Addended byBETHA COBY HEIGHT on: 03/20/2024 12:56 PM   Modules accepted: Orders

## 2024-03-20 NOTE — Telephone Encounter (Signed)
 Pt Calling stating he would like refill of OLANZapine  (ZYPREXA ) 2.5 MG tablet  [id number] [place], [place] - [address]   Last seen 01/18/24 Next apt 04/16/24

## 2024-03-23 ENCOUNTER — Other Ambulatory Visit
Admission: RE | Admit: 2024-03-23 | Discharge: 2024-03-23 | Disposition: A | Discharge status: Home / Self Care | Source: Ambulatory Visit | Attending: Nephrology | Admitting: Nephrology

## 2024-03-23 ENCOUNTER — Telehealth: Payer: Self-pay | Admitting: Psychiatry

## 2024-03-23 DIAGNOSIS — R7889 Finding of other specified substances, not normally found in blood: Secondary | ICD-10-CM | POA: Insufficient documentation

## 2024-03-23 LAB — LITHIUM LEVEL: Lithium Lvl: 0.68 mmol/L (ref 0.60–1.20)

## 2024-03-25 NOTE — Telephone Encounter (Signed)
 Please assist patient.

## 2024-03-26 ENCOUNTER — Other Ambulatory Visit
Admission: RE | Admit: 2024-03-26 | Discharge: 2024-03-26 | Disposition: A | Discharge status: Home / Self Care | Source: Ambulatory Visit | Attending: Nephrology | Admitting: Nephrology

## 2024-03-26 DIAGNOSIS — N186 End stage renal disease: Secondary | ICD-10-CM | POA: Diagnosis present

## 2024-03-26 LAB — LITHIUM LEVEL: Lithium Lvl: 0.87 mmol/L (ref 0.60–1.20)

## 2024-03-26 NOTE — Telephone Encounter (Signed)
 Called patient to make sure he has the same insurance that is in his chart or if it changed at the beginning of the year no answer left voicemail for patient to call the office back with the information

## 2024-03-28 ENCOUNTER — Telehealth (INDEPENDENT_AMBULATORY_CARE_PROVIDER_SITE_OTHER): Payer: Self-pay

## 2024-03-28 NOTE — Telephone Encounter (Signed)
 Spoke with the patient and he is scheduled with Dr. Marea for a left arm fistulagram on 04/05/24 with a 10:15 am arrival time to the [facility]. Pre-procedure instructions were discussed and will be sent to Mychart and mailed.

## 2024-04-03 ENCOUNTER — Other Ambulatory Visit
Admission: RE | Admit: 2024-04-03 | Discharge: 2024-04-03 | Disposition: A | Discharge status: Home / Self Care | Source: Ambulatory Visit | Attending: Nephrology | Admitting: Nephrology

## 2024-04-03 DIAGNOSIS — R7889 Finding of other specified substances, not normally found in blood: Secondary | ICD-10-CM | POA: Insufficient documentation

## 2024-04-03 LAB — LITHIUM LEVEL: Lithium Lvl: 1.03 mmol/L (ref 0.60–1.20)

## 2024-04-05 ENCOUNTER — Telehealth: Payer: Self-pay | Admitting: Psychiatry

## 2024-04-05 ENCOUNTER — Other Ambulatory Visit: Payer: Self-pay | Admitting: Nurse Practitioner

## 2024-04-05 ENCOUNTER — Encounter: Admission: RE | Disposition: A | Payer: Self-pay | Source: Home / Self Care | Attending: Vascular Surgery

## 2024-04-05 ENCOUNTER — Encounter: Payer: Self-pay | Admitting: Vascular Surgery

## 2024-04-05 ENCOUNTER — Ambulatory Visit
Admission: RE | Admit: 2024-04-05 | Discharge: 2024-04-05 | Disposition: A | Discharge status: Home / Self Care | Attending: Vascular Surgery | Admitting: Vascular Surgery

## 2024-04-05 DIAGNOSIS — T82510A Breakdown (mechanical) of surgically created arteriovenous fistula, initial encounter: Secondary | ICD-10-CM | POA: Insufficient documentation

## 2024-04-05 DIAGNOSIS — N186 End stage renal disease: Secondary | ICD-10-CM

## 2024-04-05 DIAGNOSIS — T82858A Stenosis of vascular prosthetic devices, implants and grafts, initial encounter: Secondary | ICD-10-CM | POA: Diagnosis not present

## 2024-04-05 DIAGNOSIS — Z87891 Personal history of nicotine dependence: Secondary | ICD-10-CM | POA: Insufficient documentation

## 2024-04-05 DIAGNOSIS — I120 Hypertensive chronic kidney disease with stage 5 chronic kidney disease or end stage renal disease: Secondary | ICD-10-CM | POA: Insufficient documentation

## 2024-04-05 DIAGNOSIS — I871 Compression of vein: Secondary | ICD-10-CM | POA: Diagnosis not present

## 2024-04-05 DIAGNOSIS — T82898A Other specified complication of vascular prosthetic devices, implants and grafts, initial encounter: Secondary | ICD-10-CM

## 2024-04-05 DIAGNOSIS — Y832 Surgical operation with anastomosis, bypass or graft as the cause of abnormal reaction of the patient, or of later complication, without mention of misadventure at the time of the procedure: Secondary | ICD-10-CM | POA: Insufficient documentation

## 2024-04-05 DIAGNOSIS — Z992 Dependence on renal dialysis: Secondary | ICD-10-CM | POA: Diagnosis not present

## 2024-04-05 DIAGNOSIS — E782 Mixed hyperlipidemia: Secondary | ICD-10-CM

## 2024-04-05 HISTORY — PX: A/V FISTULAGRAM: CATH118298

## 2024-04-05 LAB — POTASSIUM (ARMC VASCULAR LAB ONLY): Potassium (ARMC vascular lab): 3.9 mmol/L (ref 3.5–5.1)

## 2024-04-05 MED ORDER — MIDAZOLAM HCL 2 MG/ML PO SYRP: 8.0000 mg | ORAL_SOLUTION | Freq: Once | ORAL | Status: DC | PRN

## 2024-04-05 MED ORDER — CEFAZOLIN SODIUM-DEXTROSE 1-4 GM/50ML-% IV SOLN
1.0000 g | INTRAVENOUS | Status: AC
  Administered 2024-04-05: 1 g via INTRAVENOUS

## 2024-04-05 MED ORDER — FAMOTIDINE 20 MG PO TABS: 40.0000 mg | ORAL_TABLET | Freq: Once | ORAL | Status: DC | PRN

## 2024-04-05 MED ORDER — CEFAZOLIN SODIUM-DEXTROSE 1-4 GM/50ML-% IV SOLN
INTRAVENOUS | Status: AC
  Filled 2024-04-05: qty 50

## 2024-04-05 MED ORDER — FENTANYL CITRATE (PF) 100 MCG/2ML IJ SOLN
INTRAMUSCULAR | Status: DC | PRN
  Administered 2024-04-05: 25 ug via INTRAVENOUS
  Administered 2024-04-05: 50 ug via INTRAVENOUS

## 2024-04-05 MED ORDER — HYDROMORPHONE HCL 1 MG/ML IJ SOLN: 1.0000 mg | Freq: Once | INTRAMUSCULAR | Status: DC | PRN

## 2024-04-05 MED ORDER — MIDAZOLAM HCL (PF) 2 MG/2ML IJ SOLN
INTRAMUSCULAR | Status: DC | PRN
  Administered 2024-04-05 (×2): 1 mg via INTRAVENOUS

## 2024-04-05 MED ORDER — FENTANYL CITRATE (PF) 100 MCG/2ML IJ SOLN
INTRAMUSCULAR | Status: AC
  Filled 2024-04-05: qty 2

## 2024-04-05 MED ORDER — HEPARIN (PORCINE) IN NACL 1000-0.9 UT/500ML-% IV SOLN
INTRAVENOUS | Status: DC | PRN
  Administered 2024-04-05: 500 mL

## 2024-04-05 MED ORDER — MIDAZOLAM HCL 2 MG/2ML IJ SOLN
INTRAMUSCULAR | Status: AC
  Filled 2024-04-05: qty 4

## 2024-04-05 MED ORDER — ONDANSETRON HCL 4 MG/2ML IJ SOLN: 4.0000 mg | Freq: Four times a day (QID) | INTRAMUSCULAR | Status: DC | PRN

## 2024-04-05 MED ORDER — SODIUM CHLORIDE 0.9 % IV SOLN: INTRAVENOUS | Status: DC

## 2024-04-05 MED ORDER — IODIXANOL 320 MG/ML IV SOLN
INTRAVENOUS | Status: DC | PRN
  Administered 2024-04-05: 20 mL

## 2024-04-05 MED ORDER — LIDOCAINE-EPINEPHRINE (PF) 1 %-1:200000 IJ SOLN
INTRAMUSCULAR | Status: DC | PRN
  Administered 2024-04-05: 10 mL

## 2024-04-05 MED ORDER — HEPARIN SODIUM (PORCINE) 1000 UNIT/ML IJ SOLN
INTRAMUSCULAR | Status: AC
  Filled 2024-04-05: qty 10

## 2024-04-05 MED ORDER — METHYLPREDNISOLONE SODIUM SUCC 125 MG IJ SOLR: 125.0000 mg | Freq: Once | INTRAMUSCULAR | Status: DC | PRN

## 2024-04-05 MED ORDER — DIPHENHYDRAMINE HCL 50 MG/ML IJ SOLN: 50.0000 mg | Freq: Once | INTRAMUSCULAR | Status: DC | PRN

## 2024-04-05 NOTE — Op Note (Signed)
 [place] [facility]    OPERATIVE NOTE   PROCEDURE: 1.  Left brachial artery to axillary vein arteriovenous graft cannulation under ultrasound guidance 2.  Left arm shuntogram 3.  Covered stent placement to treat both the pseudoaneurysms and the high-grade stenosis in the graft with an 8 mm diameter by 10 cm length Viabahn stent  PRE-OPERATIVE DIAGNOSIS: 1. ESRD 2. Malfunctioning left brachial artery to axillary vein arteriovenous graft  POST-OPERATIVE DIAGNOSIS: same as above   SURGEON: Selinda Gu, MD  ANESTHESIA: local with MCS  ESTIMATED BLOOD LOSS: 3 cc  FINDING(S): 85 to 90% stenosis in the midportion of the graft.  Between this and the arterial anastomosis, there were 2 medium sized pseudoaneurysms likely due to the increased pressure from the stenosis.  The previously placed stent across the venous anastomosis was widely patent.  The central venous circulation was widely patent.  SPECIMEN(S):  None  CONTRAST: 20 cc  FLUORO TIME: 1.3 minutes  MODERATE CONSCIOUS SEDATION TIME:  Approximately 21 minutes using 2 mg of Versed  and 75 mcg of Fentanyl   INDICATIONS: Luis Miller. is a 79 y.o. male who presents with malfunctioning left brachial artery axillary vein arteriovenous graft.  The patient is scheduled for left arm shuntogram.  The patient is aware the risks include but are not limited to: bleeding, infection, thrombosis of the cannulated access, and possible anaphylactic reaction to the contrast.  The patient is aware of the risks of the procedure and elects to proceed forward.  DESCRIPTION: After full informed written consent was obtained, the patient was brought back to the angiography suite and placed supine upon the angiography table.  The patient was connected to monitoring equipment. Moderate conscious sedation was administered during a face to face encounter throughout the procedure with my supervision of the RN administering medicines and  monitoring the patient's vital signs, pulse oximetry, telemetry and mental status throughout from the start of the procedure until the patient was taken to the recovery room The left arm was prepped and draped in the standard fashion for a percutaneous access intervention.  Under ultrasound guidance, the left brachial artery to axillary vein arteriovenous graft was cannulated with a micropuncture needle under direct ultrasound guidance were it was patent and a permanent image was performed.  The microwire was advanced into the graft and the needle was exchanged for the a microsheath.  I then upsized to a 6 Fr Sheath and imaging was performed.  Hand injections were completed to image the access including the central venous system. This demonstrated 85 to 90% stenosis in the midportion of the graft.  Between this and the arterial anastomosis, there were 2 medium sized pseudoaneurysms likely due to the increased pressure from the stenosis.  The previously placed stent across the venous anastomosis was widely patent.  The central venous circulation was widely patent.  Based on the images, this patient will need a covered stent to address the pseudoaneurysms as well as the stenosis simultaneously treating both issues.  I then upsized to a 7 French sheath.. I then gave the patient 3000 units of intravenous heparin .  I then crossed the stenosis and the pseudoaneurysms with a V18 wire.  Based on the imaging, a 8 mm x 10 cm Viabahn stent.  This was placed across both pseudoaneurysms and the stenosis within the graft and deployed.  I then used a 7 mm diameter angioplasty balloon was selected for postdilatation.  The balloon was centered around the stenosis and within the new  stent and inflated to 12 ATM for 1 minute(s).  On completion imaging, pseudoaneurysms appeared to be excluded and there was less than 10% residual stenosis present.     Based on the completion imaging, no further intervention is necessary.  The wire  and balloon were removed from the sheath.  A 4-0 Monocryl purse-string suture was sewn around the sheath.  The sheath was removed while tying down the suture.  A sterile bandage was applied to the puncture site.  COMPLICATIONS: None  CONDITION: Stable   Selinda Gu  04/05/2024 1:42 PM    This note was created with Dragon Medical transcription system. Any errors in dictation are purely unintentional.
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
## 2024-04-05 NOTE — Telephone Encounter (Signed)
 Reviewed lithium  level received from [facility], dated 04/03/2024-1.03-trending up. Discussed with patient to get the lithium  level repeated tomorrow or in the next couple of days.  Patient agrees to discuss with nephrology who is currently monitoring him.  Patient provided information about lithium  toxicity and to get immediate help if he notices any symptoms.

## 2024-04-06 ENCOUNTER — Other Ambulatory Visit: Payer: Self-pay | Admitting: Psychiatry

## 2024-04-06 DIAGNOSIS — F3176 Bipolar disorder, in full remission, most recent episode depressed: Secondary | ICD-10-CM

## 2024-04-09 ENCOUNTER — Other Ambulatory Visit (INDEPENDENT_AMBULATORY_CARE_PROVIDER_SITE_OTHER): Payer: Self-pay | Admitting: Nurse Practitioner

## 2024-04-09 ENCOUNTER — Telehealth: Payer: Self-pay

## 2024-04-09 DIAGNOSIS — G4701 Insomnia due to medical condition: Secondary | ICD-10-CM

## 2024-04-09 DIAGNOSIS — N186 End stage renal disease: Secondary | ICD-10-CM

## 2024-04-09 MED ORDER — ESZOPICLONE 3 MG PO TABS: 3.0000 mg | ORAL_TABLET | Freq: Every day | ORAL | 0 refills | Status: AC

## 2024-04-09 NOTE — Telephone Encounter (Signed)
 Medication management - [facility] [place], after patient left a message he was in need of a new Bupropion  75 mg order and a new Eszopiclone  3 mg order. Pharmacist verified they had pt's new Bupropion  order sent in on 04/06/24 for #90 but that they did need a new Eszopiclone  3 mg order, last provided for #60 on 02/13/24.  Agreed to send patient's provider a request for a a new Escopiclone order for patient as collateral verified they had his Bupropion  order filled and ready for pick up.  Patient returns next on 04/16/24.

## 2024-04-09 NOTE — Telephone Encounter (Signed)
 I have sent Lunesta  3 mg to pharmacy, 90-day supply.

## 2024-04-10 ENCOUNTER — Other Ambulatory Visit (INDEPENDENT_AMBULATORY_CARE_PROVIDER_SITE_OTHER)

## 2024-04-10 ENCOUNTER — Other Ambulatory Visit
Admission: RE | Admit: 2024-04-10 | Discharge: 2024-04-10 | Disposition: A | Discharge status: Home / Self Care | Source: Ambulatory Visit | Attending: Nephrology | Admitting: Nephrology

## 2024-04-10 DIAGNOSIS — R7889 Finding of other specified substances, not normally found in blood: Secondary | ICD-10-CM | POA: Insufficient documentation

## 2024-04-10 DIAGNOSIS — N186 End stage renal disease: Secondary | ICD-10-CM | POA: Diagnosis not present

## 2024-04-10 LAB — LITHIUM LEVEL: Lithium Lvl: 1.09 mmol/L (ref 0.60–1.20)

## 2024-04-10 NOTE — Telephone Encounter (Signed)
 Medication management - Telephone call with patient to inform Dr. Coby had sent in his new Lunesta  order and that he also already [facility] an order at [facility] [place] [facility]up[facility] . Patient to call back if any issues filling new orders.

## 2024-04-11 ENCOUNTER — Other Ambulatory Visit: Payer: Self-pay

## 2024-04-11 ENCOUNTER — Emergency Department

## 2024-04-11 ENCOUNTER — Inpatient Hospital Stay
Admission: EM | Admit: 2024-04-11 | Discharge: 2024-04-20 | DRG: 682 | Disposition: A | Discharge status: Skilled Nursing Facility | Attending: Internal Medicine | Admitting: Internal Medicine

## 2024-04-11 DIAGNOSIS — I10 Essential (primary) hypertension: Secondary | ICD-10-CM | POA: Diagnosis present

## 2024-04-11 DIAGNOSIS — W19XXXA Unspecified fall, initial encounter: Secondary | ICD-10-CM | POA: Diagnosis not present

## 2024-04-11 DIAGNOSIS — Z8673 Personal history of transient ischemic attack (TIA), and cerebral infarction without residual deficits: Secondary | ICD-10-CM

## 2024-04-11 DIAGNOSIS — I120 Hypertensive chronic kidney disease with stage 5 chronic kidney disease or end stage renal disease: Principal | ICD-10-CM | POA: Diagnosis present

## 2024-04-11 DIAGNOSIS — Z79899 Other long term (current) drug therapy: Secondary | ICD-10-CM

## 2024-04-11 DIAGNOSIS — G4701 Insomnia due to medical condition: Secondary | ICD-10-CM | POA: Diagnosis present

## 2024-04-11 DIAGNOSIS — I5A Non-ischemic myocardial injury (non-traumatic): Secondary | ICD-10-CM | POA: Diagnosis not present

## 2024-04-11 DIAGNOSIS — D696 Thrombocytopenia, unspecified: Secondary | ICD-10-CM | POA: Diagnosis present

## 2024-04-11 DIAGNOSIS — Z85820 Personal history of malignant melanoma of skin: Secondary | ICD-10-CM

## 2024-04-11 DIAGNOSIS — M25551 Pain in right hip: Secondary | ICD-10-CM | POA: Diagnosis present

## 2024-04-11 DIAGNOSIS — J9601 Acute respiratory failure with hypoxia: Secondary | ICD-10-CM | POA: Diagnosis present

## 2024-04-11 DIAGNOSIS — Z888 Allergy status to other drugs, medicaments and biological substances status: Secondary | ICD-10-CM

## 2024-04-11 DIAGNOSIS — Y92009 Unspecified place in unspecified non-institutional (private) residence as the place of occurrence of the external cause: Secondary | ICD-10-CM

## 2024-04-11 DIAGNOSIS — R001 Bradycardia, unspecified: Secondary | ICD-10-CM | POA: Diagnosis present

## 2024-04-11 DIAGNOSIS — F3176 Bipolar disorder, in full remission, most recent episode depressed: Secondary | ICD-10-CM | POA: Diagnosis present

## 2024-04-11 DIAGNOSIS — Z7901 Long term (current) use of anticoagulants: Secondary | ICD-10-CM

## 2024-04-11 DIAGNOSIS — G9341 Metabolic encephalopathy: Secondary | ICD-10-CM | POA: Diagnosis present

## 2024-04-11 DIAGNOSIS — N186 End stage renal disease: Secondary | ICD-10-CM | POA: Diagnosis present

## 2024-04-11 DIAGNOSIS — R531 Weakness: Principal | ICD-10-CM

## 2024-04-11 DIAGNOSIS — E785 Hyperlipidemia, unspecified: Secondary | ICD-10-CM | POA: Diagnosis present

## 2024-04-11 DIAGNOSIS — Z96652 Presence of left artificial knee joint: Secondary | ICD-10-CM | POA: Diagnosis present

## 2024-04-11 DIAGNOSIS — E66811 Obesity, class 1: Secondary | ICD-10-CM | POA: Diagnosis present

## 2024-04-11 DIAGNOSIS — E782 Mixed hyperlipidemia: Secondary | ICD-10-CM | POA: Diagnosis present

## 2024-04-11 DIAGNOSIS — E875 Hyperkalemia: Secondary | ICD-10-CM | POA: Diagnosis present

## 2024-04-11 DIAGNOSIS — R9431 Abnormal electrocardiogram [ECG] [EKG]: Secondary | ICD-10-CM | POA: Insufficient documentation

## 2024-04-11 DIAGNOSIS — I495 Sick sinus syndrome: Secondary | ICD-10-CM | POA: Diagnosis present

## 2024-04-11 DIAGNOSIS — I483 Typical atrial flutter: Secondary | ICD-10-CM | POA: Diagnosis present

## 2024-04-11 DIAGNOSIS — G4733 Obstructive sleep apnea (adult) (pediatric): Secondary | ICD-10-CM | POA: Diagnosis not present

## 2024-04-11 DIAGNOSIS — Z6831 Body mass index (BMI) 31.0-31.9, adult: Secondary | ICD-10-CM

## 2024-04-11 DIAGNOSIS — Y92018 Other place in single-family (private) house as the place of occurrence of the external cause: Secondary | ICD-10-CM

## 2024-04-11 DIAGNOSIS — R413 Other amnesia: Secondary | ICD-10-CM | POA: Diagnosis present

## 2024-04-11 DIAGNOSIS — M109 Gout, unspecified: Secondary | ICD-10-CM | POA: Diagnosis present

## 2024-04-11 DIAGNOSIS — Z85828 Personal history of other malignant neoplasm of skin: Secondary | ICD-10-CM

## 2024-04-11 DIAGNOSIS — Z8616 Personal history of COVID-19: Secondary | ICD-10-CM

## 2024-04-11 DIAGNOSIS — I4892 Unspecified atrial flutter: Secondary | ICD-10-CM | POA: Insufficient documentation

## 2024-04-11 DIAGNOSIS — M79641 Pain in right hand: Secondary | ICD-10-CM | POA: Diagnosis present

## 2024-04-11 DIAGNOSIS — I9581 Postprocedural hypotension: Secondary | ICD-10-CM | POA: Diagnosis not present

## 2024-04-11 DIAGNOSIS — Z992 Dependence on renal dialysis: Secondary | ICD-10-CM

## 2024-04-11 DIAGNOSIS — F319 Bipolar disorder, unspecified: Secondary | ICD-10-CM | POA: Diagnosis present

## 2024-04-11 DIAGNOSIS — I441 Atrioventricular block, second degree: Secondary | ICD-10-CM | POA: Diagnosis present

## 2024-04-11 DIAGNOSIS — Z818 Family history of other mental and behavioral disorders: Secondary | ICD-10-CM

## 2024-04-11 DIAGNOSIS — K5090 Crohn's disease, unspecified, without complications: Secondary | ICD-10-CM | POA: Diagnosis present

## 2024-04-11 DIAGNOSIS — E663 Overweight: Secondary | ICD-10-CM | POA: Diagnosis present

## 2024-04-11 DIAGNOSIS — N2581 Secondary hyperparathyroidism of renal origin: Secondary | ICD-10-CM | POA: Diagnosis present

## 2024-04-11 DIAGNOSIS — Z886 Allergy status to analgesic agent status: Secondary | ICD-10-CM

## 2024-04-11 DIAGNOSIS — R21 Rash and other nonspecific skin eruption: Secondary | ICD-10-CM | POA: Diagnosis not present

## 2024-04-11 DIAGNOSIS — N19 Unspecified kidney failure: Secondary | ICD-10-CM | POA: Diagnosis present

## 2024-04-11 DIAGNOSIS — Z87891 Personal history of nicotine dependence: Secondary | ICD-10-CM

## 2024-04-11 DIAGNOSIS — I4891 Unspecified atrial fibrillation: Secondary | ICD-10-CM | POA: Diagnosis present

## 2024-04-11 DIAGNOSIS — I420 Dilated cardiomyopathy: Secondary | ICD-10-CM | POA: Diagnosis present

## 2024-04-11 DIAGNOSIS — D631 Anemia in chronic kidney disease: Secondary | ICD-10-CM | POA: Diagnosis present

## 2024-04-11 DIAGNOSIS — N400 Enlarged prostate without lower urinary tract symptoms: Secondary | ICD-10-CM | POA: Diagnosis present

## 2024-04-11 DIAGNOSIS — I2489 Other forms of acute ischemic heart disease: Secondary | ICD-10-CM | POA: Diagnosis present

## 2024-04-11 DIAGNOSIS — R0602 Shortness of breath: Secondary | ICD-10-CM

## 2024-04-11 LAB — COMPREHENSIVE METABOLIC PANEL WITH GFR
ALT: 8 U/L (ref 0–44)
AST: 22 U/L (ref 15–41)
Albumin: 4.1 g/dL (ref 3.5–5.0)
Alkaline Phosphatase: 70 U/L (ref 38–126)
Anion gap: 19 — ABNORMAL HIGH (ref 5–15)
BUN: 101 mg/dL — ABNORMAL HIGH (ref 8–23)
CO2: 22 mmol/L (ref 22–32)
Calcium: 11 mg/dL — ABNORMAL HIGH (ref 8.9–10.3)
Chloride: 99 mmol/L (ref 98–111)
Creatinine, Ser: 12.9 mg/dL — ABNORMAL HIGH (ref 0.61–1.24)
GFR, Estimated: 4 mL/min — ABNORMAL LOW
Glucose, Bld: 114 mg/dL — ABNORMAL HIGH (ref 70–99)
Potassium: 5.7 mmol/L — ABNORMAL HIGH (ref 3.5–5.1)
Sodium: 140 mmol/L (ref 135–145)
Total Bilirubin: 0.6 mg/dL (ref 0.0–1.2)
Total Protein: 6.7 g/dL (ref 6.5–8.1)

## 2024-04-11 LAB — TROPONIN T, HIGH SENSITIVITY: Troponin T High Sensitivity: 114 ng/L (ref 0–19)

## 2024-04-11 LAB — CBC
HCT: 28.6 % — ABNORMAL LOW (ref 39.0–52.0)
Hemoglobin: 9.1 g/dL — ABNORMAL LOW (ref 13.0–17.0)
MCH: 34.2 pg — ABNORMAL HIGH (ref 26.0–34.0)
MCHC: 31.8 g/dL (ref 30.0–36.0)
MCV: 107.5 fL — ABNORMAL HIGH (ref 80.0–100.0)
Platelets: 135 K/uL — ABNORMAL LOW (ref 150–400)
RBC: 2.66 MIL/uL — ABNORMAL LOW (ref 4.22–5.81)
RDW: 15.3 % (ref 11.5–15.5)
WBC: 9 K/uL (ref 4.0–10.5)
nRBC: 0 % (ref 0.0–0.2)

## 2024-04-11 LAB — MAGNESIUM: Magnesium: 3.5 mg/dL — ABNORMAL HIGH (ref 1.7–2.4)

## 2024-04-11 MED ORDER — OLANZAPINE 5 MG PO TABS
2.5000 mg | ORAL_TABLET | Freq: Every day | ORAL | Status: DC
  Administered 2024-04-11: 2.5 mg via ORAL
  Filled 2024-04-11: qty 1

## 2024-04-11 MED ORDER — ACETAMINOPHEN 325 MG PO TABS: 650.0000 mg | ORAL_TABLET | Freq: Four times a day (QID) | ORAL | Status: DC | PRN

## 2024-04-11 MED ORDER — DM-GUAIFENESIN ER 30-600 MG PO TB12: 1.0000 | ORAL_TABLET | Freq: Two times a day (BID) | ORAL | Status: DC | PRN

## 2024-04-11 MED ORDER — DONEPEZIL HCL 5 MG PO TABS: 5.0000 mg | ORAL_TABLET | Freq: Every day | ORAL | Status: DC

## 2024-04-11 MED ORDER — HYDRALAZINE HCL 20 MG/ML IJ SOLN: 5.0000 mg | INTRAMUSCULAR | Status: DC | PRN

## 2024-04-11 MED ORDER — ALBUTEROL SULFATE (2.5 MG/3ML) 0.083% IN NEBU: 2.5000 mg | INHALATION_SOLUTION | RESPIRATORY_TRACT | Status: DC | PRN

## 2024-04-11 MED ORDER — SODIUM ZIRCONIUM CYCLOSILICATE 10 G PO PACK
10.0000 g | PACK | Freq: Once | ORAL | Status: AC
  Administered 2024-04-11: 10 g via ORAL
  Filled 2024-04-11: qty 1

## 2024-04-11 MED ORDER — ZOLPIDEM TARTRATE 5 MG PO TABS
5.0000 mg | ORAL_TABLET | Freq: Every evening | ORAL | Status: DC | PRN
  Administered 2024-04-11: 5 mg via ORAL
  Filled 2024-04-11: qty 1

## 2024-04-11 MED ORDER — HEPARIN SODIUM (PORCINE) 5000 UNIT/ML IJ SOLN
5000.0000 [IU] | Freq: Three times a day (TID) | INTRAMUSCULAR | Status: DC
  Administered 2024-04-11 – 2024-04-14 (×8): 5000 [IU] via SUBCUTANEOUS
  Filled 2024-04-11 (×8): qty 1

## 2024-04-11 MED ORDER — PRIMIDONE 50 MG PO TABS
25.0000 mg | ORAL_TABLET | Freq: Every day | ORAL | Status: DC
  Administered 2024-04-11 – 2024-04-19 (×9): 25 mg via ORAL
  Filled 2024-04-11 (×11): qty 0.5

## 2024-04-11 MED ORDER — LORATADINE 10 MG PO TABS
10.0000 mg | ORAL_TABLET | Freq: Every day | ORAL | Status: DC
  Administered 2024-04-13 – 2024-04-20 (×6): 10 mg via ORAL
  Filled 2024-04-11 (×6): qty 1

## 2024-04-11 MED ORDER — GABAPENTIN 100 MG PO CAPS
100.0000 mg | ORAL_CAPSULE | Freq: Every day | ORAL | Status: DC
  Administered 2024-04-13 – 2024-04-20 (×7): 100 mg via ORAL
  Filled 2024-04-11 (×8): qty 1

## 2024-04-11 MED ORDER — ALBUTEROL SULFATE HFA 108 (90 BASE) MCG/ACT IN AERS: 2.0000 | INHALATION_SPRAY | RESPIRATORY_TRACT | Status: DC | PRN

## 2024-04-11 MED ORDER — ROSUVASTATIN CALCIUM 10 MG PO TABS
10.0000 mg | ORAL_TABLET | Freq: Every day | ORAL | Status: DC
  Administered 2024-04-13 – 2024-04-20 (×7): 10 mg via ORAL
  Filled 2024-04-11 (×7): qty 1

## 2024-04-11 MED ORDER — BUPROPION HCL 75 MG PO TABS
75.0000 mg | ORAL_TABLET | Freq: Every day | ORAL | Status: DC
  Filled 2024-04-11: qty 1

## 2024-04-11 MED ORDER — CHLORHEXIDINE GLUCONATE CLOTH 2 % EX PADS
6.0000 | MEDICATED_PAD | Freq: Every day | CUTANEOUS | Status: DC
  Administered 2024-04-12 – 2024-04-19 (×8): 6 via TOPICAL

## 2024-04-11 MED ORDER — ONDANSETRON HCL 4 MG/2ML IJ SOLN: 4.0000 mg | Freq: Three times a day (TID) | INTRAMUSCULAR | Status: DC | PRN

## 2024-04-11 MED ORDER — ADULT MULTIVITAMIN W/MINERALS CH
1.0000 | ORAL_TABLET | Freq: Every day | ORAL | Status: DC
  Administered 2024-04-13 – 2024-04-20 (×6): 1 via ORAL
  Filled 2024-04-11 (×7): qty 1

## 2024-04-11 NOTE — ED Triage Notes (Addendum)
 Pt to ED via [facility] from home. Pt was scheduled for dialysis today and they called for wellness check and pt found outside on ground. Pt reports had been out there approximately 1hr. Pt reports his pants kept falling down and he kept pulling them back up and next thing he knew he was on the ground. FD arrived and pt still on ground. Pt reports right hand pain and right hip pain. Pt's lips dry on arrival. Pt receives dialysis MWF and last tx last Wednesday. Pt lethargic but responds to name and answers RN's questions. Pt is A&Ox3.   This RN unable to obtain oxygen sats in triage despite rotating sites from hand to ear and using nose reader. Pt denies SOB   CBG 149 102/52 HR 90 98% RA

## 2024-04-11 NOTE — ED Provider Notes (Signed)
 "  [facility] Provider Note    Event Date/Time   First MD Initiated Contact with Patient 04/11/24 1510     (approximate)  History   Chief Complaint: Fall (/)  HPI  Luis Miller. is a 79 y.o. male with a past medical history of ESRD on HD Monday/Wednesday/Friday last dialysis 1 week ago, hypertension, hyperlipidemia, presents to the emergency department for weakness, found down outside.  According to report patient was supposed to have dialysis today but did not show up, they performed a wellness check and found the patient outside in his front yard.  Patient reports he had only been there for about 1 hour.  Normal temperature in the emergency department.  Patient states he was feeling somewhat weak does not believe he fell but states he had a slow descent to the ground.  Denies hitting his head.  Denies LOC.  Patient denies feeling weak but states he has been told that he is weaker than normal.  Physical Exam   Triage Vital Signs: ED Triage Vitals  Encounter Vitals Group     BP 04/11/24 1424 (!) 112/57     Girls Systolic BP Percentile --      Girls Diastolic BP Percentile --      Boys Systolic BP Percentile --      Boys Diastolic BP Percentile --      Pulse Rate 04/11/24 1424 (!) 42     Resp 04/11/24 1424 20     Temp 04/11/24 1424 98 F (36.7 C)     Temp Source 04/11/24 1424 Axillary     SpO2 --      Weight --      Height --      Head Circumference --      Peak Flow --      Pain Score 04/11/24 1422 4     Pain Loc --      Pain Education --      Exclude from Growth Chart --     Most recent vital signs: Vitals:   04/11/24 1424  BP: (!) 112/57  Pulse: (!) 42  Resp: 20  Temp: 98 F (36.7 C)    General: Awake, no distress.  CV:  Good peripheral perfusion.  Regular rate and rhythm  Resp:  Normal effort.  Equal breath sounds bilaterally.  Abd:  No distention.  Soft, nontender.  No rebound or guarding. Other:  Good range of motion in all  extremities he does state some pain in the right hip although good range of motion in the right hip.  Patient does have some pain in the right hand where he has an abrasion/small skin tear.  ED Results / Procedures / Treatments   EKG  EKG viewed and interpreted by myself shows sinus bradycardia around 53 bpm with a narrow QRS, normal axis normal intervals nonspecific ST changes.  Some electrical interference.  RADIOLOGY  I have reviewed interpret the CT head images.  No obvious bleed seen on my evaluation. Radiology is read the CT scan is negative for acute abnormality CT cervical spine negative for acute abnormality but does show some patchy opacities in the lungs.  I suspect this is more likely related to mild edema given the patient's lack of recent dialysis.  No infectious symptoms. Hip x-ray negative. Hand x-ray negative. Chest x-ray shows no acute cardiopulmonary disease.  MEDICATIONS ORDERED IN ED: Medications - No data to display   IMPRESSION / MDM / ASSESSMENT AND PLAN /  ED COURSE  I reviewed the triage vital signs and the nursing notes.  Patient's presentation is most consistent with acute presentation with potential threat to life or bodily function.  Patient presents emergency department after being found down on the ground outside his home after not showing up for dialysis.  Reports he believes his last dialysis treatment was 1 week ago.  Patient does appear somewhat somnolent but awakens to voice and answers questions appropriately and accurately.  Patient denies any pain however with palpation and movement is experiencing some minimal discomfort in the right hip and right hand.  We will obtain labs and chest x-ray right hip x-ray and right hand x-ray as a precaution.  Given 1 week without dialysis high concern for metabolic/electrolyte abnormality.  Currently patient appears comfortable, calm, no distress.  Somewhat difficult to get an O2 saturation on the patient they  appear to be in the 80s on room air placed on 3 L currently satting around 89 to 91% we will increase to 6 L while awaiting chest x-ray results.  Labs have finally resulted (very difficult stick had to have multiple nurses and lab come draw blood work).  Blood work shows a reassuring CBC normal white blood cell count chemistry shows anion gap elevation with a BUN of 101 very likely the cause of the patient's increased fatigue somnolence/weakness.  Patient's potassium elevated to 5.7.  I spoke to Dr. Dennise of nephrology he recommends Lokelma .  We will admit to the hospitalist service for further workup and treatment and nephrology will dialyze tomorrow.  FINAL CLINICAL IMPRESSION(S) / ED DIAGNOSES   Weakness Fall Uremia  Note:  This document was prepared using Dragon voice recognition software and may include unintentional dictation errors.   Dorothyann Drivers, MD 04/11/24 1910  "
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
## 2024-04-11 NOTE — ED Notes (Signed)
 Lab called for blood draw.

## 2024-04-12 ENCOUNTER — Inpatient Hospital Stay

## 2024-04-12 ENCOUNTER — Other Ambulatory Visit: Payer: Self-pay

## 2024-04-12 DIAGNOSIS — R9431 Abnormal electrocardiogram [ECG] [EKG]: Secondary | ICD-10-CM | POA: Insufficient documentation

## 2024-04-12 DIAGNOSIS — N19 Unspecified kidney failure: Secondary | ICD-10-CM

## 2024-04-12 LAB — LITHIUM LEVEL: Lithium Lvl: 1.56 mmol/L (ref 0.60–1.20)

## 2024-04-12 LAB — TROPONIN T, HIGH SENSITIVITY: Troponin T High Sensitivity: 122 ng/L (ref 0–19)

## 2024-04-12 LAB — COMPREHENSIVE METABOLIC PANEL WITH GFR
ALT: 7 U/L (ref 0–44)
AST: 25 U/L (ref 15–41)
Albumin: 3.9 g/dL (ref 3.5–5.0)
Alkaline Phosphatase: 75 U/L (ref 38–126)
Anion gap: 15 (ref 5–15)
BUN: 52 mg/dL — ABNORMAL HIGH (ref 8–23)
CO2: 27 mmol/L (ref 22–32)
Calcium: 10.2 mg/dL (ref 8.9–10.3)
Chloride: 95 mmol/L — ABNORMAL LOW (ref 98–111)
Creatinine, Ser: 7.29 mg/dL — ABNORMAL HIGH (ref 0.61–1.24)
GFR, Estimated: 7 mL/min — ABNORMAL LOW
Glucose, Bld: 100 mg/dL — ABNORMAL HIGH (ref 70–99)
Potassium: 3.7 mmol/L (ref 3.5–5.1)
Sodium: 137 mmol/L (ref 135–145)
Total Bilirubin: 1 mg/dL (ref 0.0–1.2)
Total Protein: 7 g/dL (ref 6.5–8.1)

## 2024-04-12 LAB — BASIC METABOLIC PANEL WITH GFR
Anion gap: 20 — ABNORMAL HIGH (ref 5–15)
BUN: 104 mg/dL — ABNORMAL HIGH (ref 8–23)
CO2: 21 mmol/L — ABNORMAL LOW (ref 22–32)
Calcium: 10.8 mg/dL — ABNORMAL HIGH (ref 8.9–10.3)
Chloride: 99 mmol/L (ref 98–111)
Creatinine, Ser: 13.4 mg/dL — ABNORMAL HIGH (ref 0.61–1.24)
GFR, Estimated: 3 mL/min — ABNORMAL LOW
Glucose, Bld: 96 mg/dL (ref 70–99)
Potassium: 5.5 mmol/L — ABNORMAL HIGH (ref 3.5–5.1)
Sodium: 140 mmol/L (ref 135–145)

## 2024-04-12 LAB — URINALYSIS, ROUTINE W REFLEX MICROSCOPIC
Bilirubin Urine: NEGATIVE
Glucose, UA: NEGATIVE mg/dL
Ketones, ur: NEGATIVE mg/dL
Nitrite: NEGATIVE
Protein, ur: 100 mg/dL — AB
RBC / HPF: >50 RBC/hpf (ref 0–5)
Specific Gravity, Urine: 1.014 (ref 1.005–1.030)
Squamous Epithelial / HPF: 0 /HPF (ref 0–5)
WBC, UA: >50 WBC/hpf (ref 0–5)
pH: 5 (ref 5.0–8.0)

## 2024-04-12 LAB — CBC
HCT: 31 % — ABNORMAL LOW (ref 39.0–52.0)
Hemoglobin: 9.8 g/dL — ABNORMAL LOW (ref 13.0–17.0)
MCH: 34.3 pg — ABNORMAL HIGH (ref 26.0–34.0)
MCHC: 31.6 g/dL (ref 30.0–36.0)
MCV: 108.4 fL — ABNORMAL HIGH (ref 80.0–100.0)
Platelets: 156 K/uL (ref 150–400)
RBC: 2.86 MIL/uL — ABNORMAL LOW (ref 4.22–5.81)
RDW: 15.1 % (ref 11.5–15.5)
WBC: 7.9 K/uL (ref 4.0–10.5)
nRBC: 0 % (ref 0.0–0.2)

## 2024-04-12 LAB — PHOSPHORUS: Phosphorus: 4.8 mg/dL — ABNORMAL HIGH (ref 2.5–4.6)

## 2024-04-12 LAB — MAGNESIUM: Magnesium: 2.8 mg/dL — ABNORMAL HIGH (ref 1.7–2.4)

## 2024-04-12 LAB — POTASSIUM: Potassium: 5.8 mmol/L — ABNORMAL HIGH (ref 3.5–5.1)

## 2024-04-12 LAB — CK: Total CK: 232 U/L (ref 49–397)

## 2024-04-12 MED ORDER — PATIROMER SORBITEX CALCIUM 8.4 G PO PACK: 25.2000 g | PACK | Freq: Every day | ORAL | Status: DC

## 2024-04-12 MED ORDER — PENTAFLUOROPROP-TETRAFLUOROETH EX AERO: 1.0000 | INHALATION_SPRAY | CUTANEOUS | Status: DC | PRN

## 2024-04-12 MED ORDER — ALTEPLASE 2 MG IJ SOLR: 2.0000 mg | Freq: Once | INTRAMUSCULAR | Status: DC | PRN

## 2024-04-12 MED ORDER — LIDOCAINE-PRILOCAINE 2.5-2.5 % EX CREA: 1.0000 | TOPICAL_CREAM | CUTANEOUS | Status: DC | PRN

## 2024-04-12 MED ORDER — LIDOCAINE HCL (PF) 1 % IJ SOLN
5.0000 mL | INTRAMUSCULAR | Status: DC | PRN
  Filled 2024-04-12: qty 5

## 2024-04-12 MED ORDER — HEPARIN SODIUM (PORCINE) 1000 UNIT/ML DIALYSIS: 1000.0000 [IU] | INTRAMUSCULAR | Status: DC | PRN

## 2024-04-12 MED ORDER — NEPRO/CARBSTEADY PO LIQD: 237.0000 mL | ORAL | Status: DC | PRN

## 2024-04-12 MED ORDER — PATIROMER SORBITEX CALCIUM 8.4 G PO PACK
25.2000 g | PACK | Freq: Every day | ORAL | Status: DC
  Administered 2024-04-12 – 2024-04-14 (×2): 25.2 g via ORAL
  Filled 2024-04-12 (×5): qty 3

## 2024-04-12 MED ORDER — METOPROLOL TARTRATE 5 MG/5ML IV SOLN
2.5000 mg | Freq: Once | INTRAVENOUS | Status: AC
  Administered 2024-04-12: 2.5 mg via INTRAVENOUS
  Filled 2024-04-12: qty 5

## 2024-04-12 MED ORDER — ANTICOAGULANT SODIUM CITRATE 4% (200MG/5ML) IV SOLN
5.0000 mL | Status: DC | PRN
  Filled 2024-04-12: qty 5

## 2024-04-12 NOTE — Progress Notes (Signed)
 Pt otf for dialysis

## 2024-04-12 NOTE — Progress Notes (Signed)
 Lab at bedside

## 2024-04-12 NOTE — Progress Notes (Signed)
 " [facility]  ROUNDING NOTE   Subjective:   Luis Miller. is a 79 year old male with end-stage renal disease on hemodialysis, hypertension, gout, depression, history of melanoma, Crohn's disease, history of stroke, bipolar disorder. 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 Due to access concerns, last treatment received on 1/14.  Patient seen and evaluated during dialysis.   HEMODIALYSIS FLOWSHEET:  Blood Flow Rate (mL/min): 0 mL/min Arterial Pressure (mmHg): -5.66 mmHg Venous Pressure (mmHg): 72.12 mmHg TMP (mmHg): 25.45 mmHg Ultrafiltration Rate (mL/min): 508 mL/min Dialysate Flow Rate (mL/min): 299 ml/min  Patient somnolent but easily aroused for short periods.  Oriented to name only.  Denies pain or shortness of breath.  Chart review states patient found outside on the ground.  Patient stated at the time he was on his way to dialysis.  Labs on ED arrival concerning for potassium 5.7, BUN 101, creatinine 12.9 with GFR 4, troponin is 114, and hemoglobin 9.1.  Lithium  1.56.  All imaging negative for acute findings or fractures.  We have been consulted to maintain dialysis during this admission.   Objective:  Vital signs in last 24 hours:  Temp:  [98.1 F (36.7 C)-100.1 F (37.8 C)] 99.4 F (37.4 C) (01/22 1200) Pulse Rate:  [43-116] 112 (01/22 1200) Resp:  [17-25] 20 (01/22 1200) BP: (110-153)/(61-113) 143/88 (01/22 1200) SpO2:  [95 %-100 %] 98 % (01/22 1200) FiO2 (%):  [32 %] 32 % (01/21 2358) Weight:  [88 kg-89.4 kg] 88 kg (01/22 1109)  Weight change:  Filed Weights   04/12/24 0338 04/12/24 0737 04/12/24  1109  Weight: 89.4 kg 88.6 kg 88 kg    Intake/Output: No intake/output data recorded.   Intake/Output this shift:  Total I/O In: -  Out: 1200 [Other:1200]  Physical Exam: General: NAD, resting comfortably  Head: Normocephalic, atraumatic. Moist oral mucosal membranes  Eyes: Anicteric  Lungs:  Clear to auscultation, normal effort  Heart: Regular rate and rhythm  Abdomen:  Soft, nontender  Extremities:  Trace peripheral edema.  Neurologic: Somnolent, easily aroused  Skin: Warm,dry, no rash  Access: Left AVF    Basic Metabolic Panel: Recent Labs  Lab 04/11/24 1800 04/11/24 2319 04/12/24 0425 04/12/24 1302  NA 140  --  140 137  K 5.7* 5.8* 5.5* 3.7  CL 99  --  99 95*  CO2 22  --  21* 27  GLUCOSE 114*  --  96 100*  BUN 101*  --  104* 52*  CREATININE 12.90*  --  13.40* 7.29*  CALCIUM  11.0*  --  10.8* 10.2  MG 3.5*  --   --  2.8*  PHOS  --   --   --  4.8*    Liver Function Tests: Recent Labs  Lab 04/11/24 1800 04/12/24 1302  AST 22 25  ALT 8 7  ALKPHOS 70 75  BILITOT 0.6 1.0  PROT 6.7 7.0  ALBUMIN 4.1 3.9   No results for input(s): LIPASE, AMYLASE in the last 168 hours. No results for input(s): AMMONIA in the last 168 hours.  CBC: Recent Labs  Lab 04/11/24 1800 04/12/24 0425  WBC 9.0 7.9  HGB 9.1* 9.8*  HCT 28.6* 31.0*  MCV 107.5*  108.4*  PLT 135* 156    Cardiac Enzymes: Recent Labs  Lab 04/11/24 2319  CKTOTAL 232    BNP: Invalid input(s): POCBNP  CBG: No results for input(s): GLUCAP in the last 168 hours.  Microbiology: Results for orders placed or performed in visit on 05/25/23  Microscopic Examination     Status: Abnormal   Collection Time: 05/25/23  4:05 PM   Urine  Result Value Ref Range Status   WBC, UA 11-30 (A) 0 - 5 /hpf Final   RBC, Urine 3-10 (A) 0 - 2 /hpf Final   Epithelial Cells (non renal) 0-10 0 - 10 /hpf Final   Casts Present (A) None seen /lpf Final   Cast Type Hyaline casts N/A Final   Mucus, UA  Present (A) Not Estab. Final   Bacteria, UA Moderate (A) None seen/Few Final    Coagulation Studies: No results for input(s): LABPROT, INR in the last 72 hours.  Urinalysis: Recent Labs    04/11/24 2317  COLORURINE YELLOW*  LABSPEC 1.014  PHURINE 5.0  GLUCOSEU NEGATIVE  HGBUR LARGE*  BILIRUBINUR NEGATIVE  KETONESUR NEGATIVE  PROTEINUR 100*  NITRITE NEGATIVE  LEUKOCYTESUR TRACE*      Imaging: CT Cervical Spine Wo Contrast Result Date: 04/11/2024 EXAM: CT CERVICAL SPINE WITHOUT CONTRAST 04/11/2024 05:19:06 PM TECHNIQUE: CT of the cervical spine was performed without the administration of intravenous contrast. Multiplanar reformatted images are provided for review. Automated exposure control, iterative reconstruction, and/or weight based adjustment of the mA/kV was utilized to reduce the radiation dose to as low as reasonably achievable. COMPARISON: 03/30/2006 CLINICAL HISTORY: fall FINDINGS: BONES AND ALIGNMENT: Straightening of the normal cervical lordosis. There is trace degenerative anterolisthesis of C3 on C4 and C4 on C5. No acute fracture or traumatic malalignment. DEGENERATIVE CHANGES: Disc space narrowing most pronounced at C5-C6 and C6-C7. There is no high grade osseous spinal canal stenosis. Foraminal stenosis greatest at C3-C4 and C4-C5. SOFT TISSUES: No prevertebral soft tissue swelling. LUNGS: There are patchy ground glass opacities in the right upper lobe concerning for infection. IMPRESSION: 1. No acute fracture or traumatic malalignment of the cervical spine. 2. Patchy ground glass opacities in the right upper lobe concerning for infection. Electronically signed by: Donnice Mania MD 04/11/2024 05:47 PM EST RP Workstation: [id number]   CT HEAD WO CONTRAST Result Date: 04/11/2024 EXAM: CT HEAD WITHOUT CONTRAST 04/11/2024 05:19:06 PM TECHNIQUE: CT of the head was performed without the administration of intravenous contrast. Automated exposure control, iterative  reconstruction, and/or weight based adjustment of the mA/kV was utilized to reduce the radiation dose to as low as reasonably achievable. COMPARISON: 11/21/2022 CLINICAL HISTORY: fall fall fall fall fall FINDINGS: BRAIN AND VENTRICLES: No acute hemorrhage. No evidence of acute infarct. No hydrocephalus. No extra-axial collection. No mass effect or midline shift. Mild chronic microvascular ischemic changes. Mild parenchymal volume loss. Atherosclerosis of the carotid siphons. ORBITS: Bilateral lens replacement. SINUSES: No acute abnormality. SOFT TISSUES AND SKULL: No acute soft tissue abnormality. No skull fracture. IMPRESSION: 1. No acute intracranial abnormality. Electronically signed by: Donnice Mania MD 04/11/2024 05:26 PM EST RP Workstation: [id number]   DG Chest Port 1 View Result Date: 04/11/2024 CLINICAL DATA:  Hypoxia.  Fall. EXAM: PORTABLE CHEST 1 VIEW COMPARISON:  11/21/2022 FINDINGS: Patient is rotated to the right. Lungs are hypoinflated without focal airspace consolidation or effusion. Cardiomediastinal silhouette is within normal. Multiple old right posterolateral rib fractures. Several old lateral left rib fractures. IMPRESSION: Hypoinflation without acute cardiopulmonary disease. Electronically Signed   By:  Toribio Agreste M.D.   On: 04/11/2024 15:48   DG Hand Complete Right Result Date: 04/11/2024 CLINICAL DATA:  Fall with right hand pain. EXAM: RIGHT HAND - COMPLETE 3+ VIEW COMPARISON:  None Available. FINDINGS: Degenerative changes over the first carpometacarpal joint. Mild decreased bone mineralization. Bony alignment is normal. There is no evidence of acute fracture or dislocation. IMPRESSION: 1. No acute findings. 2. Degenerative changes over the first carpometacarpal joint. Electronically Signed   By: Toribio Agreste M.D.   On: 04/11/2024 15:46   DG Hip Unilat W or Wo Pelvis 2-3 Views Right Result Date: 04/11/2024 CLINICAL DATA:  Fall. EXAM: DG HIP (WITH OR WITHOUT PELVIS) 2-3V RIGHT  COMPARISON:  None Available. FINDINGS: There is no evidence of acute fracture or dislocation. Mild degenerate changes of the spine and sacroiliac joints. Penile prosthesis. Remainder of the exam is unremarkable. IMPRESSION: No acute findings. Electronically Signed   By: Toribio Agreste M.D.   On: 04/11/2024 15:45     Medications:     buPROPion   75 mg Oral Daily   Chlorhexidine  Gluconate Cloth  6 each Topical Q0600   donepezil   5 mg Oral QHS   gabapentin   100 mg Oral Daily   heparin   5,000 Units Subcutaneous Q8H   loratadine   10 mg Oral Daily   multivitamin with minerals  1 tablet Oral Daily   OLANZapine   2.5 mg Oral QHS   patiromer   25.2 g Oral Daily   primidone   25 mg Oral QHS   rosuvastatin   10 mg Oral Daily   acetaminophen , albuterol , dextromethorphan-guaiFENesin , hydrALAZINE , ondansetron  (ZOFRAN ) IV, zolpidem   Assessment/ Plan:  [name]. is a 79 y.o.  male with end-stage renal disease on hemodialysis, hypertension, gout, depression, history of melanoma, Crohn's disease, history of stroke, bipolar disorder. Patient presents to ED after suffering a fall and has been admitted for Mixed hyperlipidemia [E78.2] Uremia [N19] Weakness [R53.1] Insomnia due to medical condition [G47.01] Bipolar disorder, in full remission, most recent episode depressed [F31.76]   End stage renal disease on hemodialysis. Last treatment received on 1/14 due to access. Concerning for uremia, BUN 101. Will offer dialysis today and tomorrow to improve mentation. Venous site does prove difficult for access. Patient recently had fistulagram, no interventions required.   2. Anemia of chronic kidney disease Lab Results  Component Value Date   HGB 9.8 (L) 04/12/2024    Hgb within optimal range. No need for ESA at this time.   3. Secondary Hyperparathyroidism: with outpatient labs: PTH 235, phosphorus 6.0, calcium  10.4 on 04/13/23.   Lab Results  Component Value Date   CALCIUM  10.2 04/12/2024    CAION 1.17 11/05/2021   PHOS 4.8 (H) 04/12/2024    Will continue to monitor bone minerals during this admission.   4. Fall, unwitnessed. Imaging negative for acute fracture. PT/OT.    LOS: 0 Luis Miller 1/22/20263:20 PM   "

## 2024-04-12 NOTE — Progress Notes (Signed)
Pt otf for CT

## 2024-04-12 NOTE — Plan of Care (Signed)
  Problem: Clinical Measurements: Goal: Diagnostic test results will improve Outcome: Progressing   Problem: Coping: Goal: Level of anxiety will decrease Outcome: Progressing   Problem: Safety: Goal: Ability to remain free from injury will improve Outcome: Progressing   

## 2024-04-12 NOTE — Progress Notes (Signed)
" °  Received patient in bed to unit.   Informed consent signed and in chart.    TX duration: 3hrs     Transported back to floor  Hand-off given to patient's nurse. No c/o   no acute distress noted    Access used: L AVF Access issues: none   Total UF removed: 1.2L Medication(s) given: none Post HD VS: wnl      Olivia Hurst LPN [facility]   "

## 2024-04-12 NOTE — Progress Notes (Signed)
 OT Cancellation Note  Patient Details Name: Luis Miller. MRN: [id number] DOB: 1945/04/06   Cancelled Treatment:    Reason Eval/Treat Not Completed: Patient at procedure or test/ unavailable. Chart reviewed - pt off floor for HD. Will continue to follow and initiate services as pt medically appropriate to participate in therapy.   Shanell Aden OTS    Andreah Goheen 04/12/2024, 8:47 AM

## 2024-04-12 NOTE — Progress Notes (Signed)
 PT Cancellation Note  Patient Details Name: Luis Miller. MRN: [id number] DOB: January 29, 1946   Cancelled Treatment:    Reason Eval/Treat Not Completed: Patient at procedure or test/unavailable (Consult received and chart reviewed.  Patient currently off unit for HD; will re-attempt at later time/date as medically appropriate and available.)  Janasha Barkalow H. Delores, PT, DPT, NCS 04/12/24, 9:03 AM [phone]

## 2024-04-12 NOTE — Progress Notes (Signed)
 Dr. Leesa informed of patient's inability to follow commands after returning from dialysis. Pt appears to have trouble keeping eyes open. PERRLA. Pt wakes to voice but does not follow commands. Face symmetrical. EKG ordered per Dr. Leesa and completed by this RN. Informed Dr. Dezii that EKG is in his chart. Tele monitor remains on patient

## 2024-04-12 NOTE — Progress Notes (Signed)
 " PROGRESS NOTE    Luis Miller.  FMW:[id number] DOB: 10-Dec-1945 DOA: 04/11/2024 PCP: Lateef, Munsoor, MD  Chief Complaint  Patient presents with   [facility]:  Luis Miller. is a 79 year old male with ESRD on HD MWF, hypertension, hyperlipidemia, junctional bradycardia, stroke, gout, memory loss, bipolar depression, BPH, OSA, anemia, thrombocytopenia, Crohn's disease, ulcerative colitis who presents after being found down. Patient was meant to have dialysis on day of arrival but did not arrive.  They performed a wellness check and patient was found on the ground outside of his home.  He reports he had been there for about an hour with generalized weakness.  He does not believe he fell and endorses a slow descent to the ground.  Head CT on arrival was without acute intracranial abnormality.  Patient has multiple lab derangements on arrival consistent with being overdue for dialysis including potassium of 5.7, calcium  11, bicarb 22, creatinine 12.9.  CXR negative for infiltration.  CT C-spine shows patchy ground glass infiltrates in the right upper lobe.  Patient was admitted and nephrology was consulted.  Patient received dialysis for the first time on 1/22.  He had significant improvement in labs afterwards.  Subjective: Evaluated patient while he was receiving dialysis.  He is very drowsy.  He is disoriented to all questions.  Objective: Vitals:   04/12/24 1100 04/12/24 1109 04/12/24 1200 04/12/24 1535  BP: 136/84 (!) 153/87 (!) 143/88 131/86  Pulse: (!) 116 (!) 112 (!) 112 (!) 112  Resp: (!) 22 17 20 20   Temp:  98.3 F (36.8 C) 99.4 F (37.4 C) 98.9 F (37.2 C)  TempSrc:  Oral Axillary Oral  SpO2: 98% 98% 98% 99%  Weight:  88 kg    Height:        Intake/Output Summary (Last 24 hours) at 04/12/2024 1609 Last data filed at 04/12/2024 1109 Gross per 24 hour  Intake --  Output 1200 ml  Net -1200 ml   Filed Weights   04/12/24 0338 04/12/24 0737  04/12/24 1109  Weight: 89.4 kg 88.6 kg 88 kg    Examination: General exam: Appears calm and comfortable Respiratory system: No work of breathing, symmetric chest wall expansion Cardiovascular system: S1 & S2 heard, RRR.  Gastrointestinal system: Abdomen is nondistended, soft and nontender.  Neuro: Drowsy, disoriented, requires significant prompting to answer questions, occasional twitching extremities  Assessment & Plan:  Principal Problem:   Uremia Active Problems:   Junctional bradycardia   Hyperkalemia   Hypercalcemia   Hypermagnesemia   Fall at home, initial encounter   Myocardial injury   HTN (hypertension)   Hyperlipidemia   ESRD on hemodialysis (HCC)   Thrombocytopenia   Anemia in ESRD (end-stage renal disease) (HCC)   Bipolar depression (HCC)   Obstructive sleep apnea   Memory loss   Overweight (BMI 25.0-29.9)   QT prolongation    Uremia ESRD on HD Electrolyte disturbances including hyperkalemia, hypercalcemia, hypomagnesemia - Last dialysis 1 week prior to arrival.  Patient appears to have been having some transportation concerns - Patient with uremia on arrival, potassium 5.7, magnesium  3.5, BUN 101 - Has received dialysis x 1 via fistula - Per nephrology venous site has been difficult to access but does appear to be functional.  Had recent fistulogram without intervention needed - Will require ongoing HD - Labs improved after dialysis today. - Repeat CMP in AM.  Prolonged QT interval - QTc on EKG today:  640 (after HD) - Hold all antipsychotic medications medications that could cause prolonged QTc - Repeat EKG - Patient was found to be Afib RVR 110s.  Received 2.5 mg IVP metoprolol .  Had brief improvement. - Continue monitoring on telemetry. - Repeat EKG tomorrow - Echo ordered  Metabolic encephalopathy - Presumed secondary to uremia.  Hopefully will improve with continued HD - Head CT on arrival without acute intracranial abnormality - Repeat head  CT in 2 days the same - Continue to monitor closely - Will need additional dialysis sessions - PT/OT - Frequent reorientation - Fall precautions - Delirium precautions  Junctional bradycardia - Chronic.  Heart rate regularly in the 50s. - Patient has actually been elevated throughout the day today - Avoid scheduled nodal blockers  Fall at home, initial encounter - Trauma scans negative for acute injury - PT/OT  Elevated troponin - Troponin 114, --> 122 no chest pain - Likely demand ischemia with decreased clearance due to ESRD - Continue statin -- Obtain ECHO  Hypertension - Resume home meds  Hyperlipidemia - Continue statin  Thrombocytopenia, chronic - Platelets stable - Trend CBC  Anemia in ESRD - Hemoglobin 9.1 - Follow CBC, no acute bleeding  Bipolar depression - Currently with prolonged QTc.  Hold all psych medications at this time - Lithium  level is elevated.  Hold lithium   OSA - CPAP at night  Memory loss - On donepezil  at home   Body mass index is 31.31 kg/m. Overweight - Outpatient follow up for lifestyle modification and risk factor management   DVT prophylaxis: Heparin    Code Status: Full Code Disposition:  Inpt pending clinical resolution  Consultants:    Procedures:    Antimicrobials:  Anti-infectives (From admission, onward)    None       Data Reviewed: I have personally reviewed following labs and imaging studies CBC: Recent Labs  Lab 04/11/24 1800 04/12/24 0425  WBC 9.0 7.9  HGB 9.1* 9.8*  HCT 28.6* 31.0*  MCV 107.5* 108.4*  PLT 135* 156   Basic Metabolic Panel: Recent Labs  Lab 04/11/24 1800 04/11/24 2319 04/12/24 0425 04/12/24 1302  NA 140  --  140 137  K 5.7* 5.8* 5.5* 3.7  CL 99  --  99 95*  CO2 22  --  21* 27  GLUCOSE 114*  --  96 100*  BUN 101*  --  104* 52*  CREATININE 12.90*  --  13.40* 7.29*  CALCIUM  11.0*  --  10.8* 10.2  MG 3.5*  --   --  2.8*  PHOS  --   --   --  4.8*   GFR: Estimated  Creatinine Clearance: 8.5 mL/min (A) (by C-G formula based on SCr of 7.29 mg/dL (H)). Liver Function Tests: Recent Labs  Lab 04/11/24 1800 04/12/24 1302  AST 22 25  ALT 8 7  ALKPHOS 70 75  BILITOT 0.6 1.0  PROT 6.7 7.0  ALBUMIN 4.1 3.9   CBG: No results for input(s): GLUCAP in the last 168 hours.  No results found for this or any previous visit (from the past 240 hours).   Radiology Studies: CT HEAD WO CONTRAST ( ) Result Date: 04/12/2024 CLINICAL DATA:  Altered mental status. EXAM: CT HEAD WITHOUT CONTRAST TECHNIQUE: Contiguous axial images were obtained from the base of the skull through the vertex without intravenous contrast. RADIATION DOSE REDUCTION: This exam was performed according to the departmental dose-optimization program which includes automated exposure control, adjustment of the mA and/or kV according to patient size and/or  use of iterative reconstruction technique. COMPARISON:  04/11/2024 and 11/21/2022 FINDINGS: Brain: Ventricles, cisterns and other CSF spaces are within normal. There is no mass, mass effect, shift of midline structures or acute hemorrhage. No acute infarction. Vascular: No hyperdense vessel or unexpected calcification. Skull: Normal. Negative for fracture or focal lesion. Sinuses/Orbits: No acute finding. Other: None. IMPRESSION: No acute findings. Electronically Signed   By: Toribio Agreste M.D.   On: 04/12/2024 15:28   CT Cervical Spine Wo Contrast Result Date: 04/11/2024 EXAM: CT CERVICAL SPINE WITHOUT CONTRAST 04/11/2024 05:19:06 PM TECHNIQUE: CT of the cervical spine was performed without the administration of intravenous contrast. Multiplanar reformatted images are provided for review. Automated exposure control, iterative reconstruction, and/or weight based adjustment of the mA/kV was utilized to reduce the radiation dose to as low as reasonably achievable. COMPARISON: 03/30/2006 CLINICAL HISTORY: fall FINDINGS: BONES AND ALIGNMENT: Straightening of  the normal cervical lordosis. There is trace degenerative anterolisthesis of C3 on C4 and C4 on C5. No acute fracture or traumatic malalignment. DEGENERATIVE CHANGES: Disc space narrowing most pronounced at C5-C6 and C6-C7. There is no high grade osseous spinal canal stenosis. Foraminal stenosis greatest at C3-C4 and C4-C5. SOFT TISSUES: No prevertebral soft tissue swelling. LUNGS: There are patchy ground glass opacities in the right upper lobe concerning for infection. IMPRESSION: 1. No acute fracture or traumatic malalignment of the cervical spine. 2. Patchy ground glass opacities in the right upper lobe concerning for infection. Electronically signed by: Donnice Mania MD 04/11/2024 05:47 PM EST RP Workstation: [id number]   CT HEAD WO CONTRAST Result Date: 04/11/2024 EXAM: CT HEAD WITHOUT CONTRAST 04/11/2024 05:19:06 PM TECHNIQUE: CT of the head was performed without the administration of intravenous contrast. Automated exposure control, iterative reconstruction, and/or weight based adjustment of the mA/kV was utilized to reduce the radiation dose to as low as reasonably achievable. COMPARISON: 11/21/2022 CLINICAL HISTORY: fall fall fall fall fall FINDINGS: BRAIN AND VENTRICLES: No acute hemorrhage. No evidence of acute infarct. No hydrocephalus. No extra-axial collection. No mass effect or midline shift. Mild chronic microvascular ischemic changes. Mild parenchymal volume loss. Atherosclerosis of the carotid siphons. ORBITS: Bilateral lens replacement. SINUSES: No acute abnormality. SOFT TISSUES AND SKULL: No acute soft tissue abnormality. No skull fracture. IMPRESSION: 1. No acute intracranial abnormality. Electronically signed by: Donnice Mania MD 04/11/2024 05:26 PM EST RP Workstation: [id number]   DG Chest Port 1 View Result Date: 04/11/2024 CLINICAL DATA:  Hypoxia.  Fall. EXAM: PORTABLE CHEST 1 VIEW COMPARISON:  11/21/2022 FINDINGS: Patient is rotated to the right. Lungs are hypoinflated without focal  airspace consolidation or effusion. Cardiomediastinal silhouette is within normal. Multiple old right posterolateral rib fractures. Several old lateral left rib fractures. IMPRESSION: Hypoinflation without acute cardiopulmonary disease. Electronically Signed   By: Toribio Agreste M.D.   On: 04/11/2024 15:48   DG Hand Complete Right Result Date: 04/11/2024 CLINICAL DATA:  Fall with right hand pain. EXAM: RIGHT HAND - COMPLETE 3+ VIEW COMPARISON:  None Available. FINDINGS: Degenerative changes over the first carpometacarpal joint. Mild decreased bone mineralization. Bony alignment is normal. There is no evidence of acute fracture or dislocation. IMPRESSION: 1. No acute findings. 2. Degenerative changes over the first carpometacarpal joint. Electronically Signed   By: Toribio Agreste M.D.   On: 04/11/2024 15:46   DG Hip Unilat W or Wo Pelvis 2-3 Views Right Result Date: 04/11/2024 CLINICAL DATA:  Fall. EXAM: DG HIP (WITH OR WITHOUT PELVIS) 2-3V RIGHT COMPARISON:  None Available. FINDINGS: There is no evidence of acute  fracture or dislocation. Mild degenerate changes of the spine and sacroiliac joints. Penile prosthesis. Remainder of the exam is unremarkable. IMPRESSION: No acute findings. Electronically Signed   By: Toribio Agreste M.D.   On: 04/11/2024 15:45    Scheduled Meds:  Chlorhexidine  Gluconate Cloth  6 each Topical Q0600   gabapentin   100 mg Oral Daily   heparin   5,000 Units Subcutaneous Q8H   loratadine   10 mg Oral Daily   multivitamin with minerals  1 tablet Oral Daily   patiromer   25.2 g Oral Daily   primidone   25 mg Oral QHS   rosuvastatin   10 mg Oral Daily   Continuous Infusions:  anticoagulant sodium citrate        LOS: 0 days  MDM: Patient is high risk for one or more organ failure.  They necessitate ongoing hospitalization for continued IV therapies and subsequent lab monitoring. Total time spent interpreting labs and vitals, reviewing the medical record, coordinating care amongst  consultants and care team members, directly assessing and discussing care with the patient and/or family: 55 min  Lena Gores, DO [facility]  To contact the attending physician between 7A-7P please use Epic Chat. To contact the covering physician during after hours 7P-7A, please review Amion.  04/12/2024, 4:09 PM   *This document has been created with the assistance of dictation software. Please excuse typographical errors. *   "

## 2024-04-12 NOTE — Plan of Care (Signed)
   Problem: Education: Goal: Knowledge of General Education information will improve Description: Including pain rating scale, medication(s)/side effects and non-pharmacologic comfort measures Outcome: Progressing   Problem: Safety: Goal: Ability to remain free from injury will improve Outcome: Progressing   Problem: Skin Integrity: Goal: Risk for impaired skin integrity will decrease Outcome: Progressing

## 2024-04-13 ENCOUNTER — Inpatient Hospital Stay: Admit: 2024-04-13 | Discharge: 2024-04-13 | Disposition: A | Attending: Family Medicine

## 2024-04-13 ENCOUNTER — Inpatient Hospital Stay

## 2024-04-13 DIAGNOSIS — R9431 Abnormal electrocardiogram [ECG] [EKG]: Secondary | ICD-10-CM | POA: Diagnosis not present

## 2024-04-13 DIAGNOSIS — I4891 Unspecified atrial fibrillation: Secondary | ICD-10-CM

## 2024-04-13 DIAGNOSIS — N19 Unspecified kidney failure: Secondary | ICD-10-CM | POA: Diagnosis not present

## 2024-04-13 LAB — RENAL FUNCTION PANEL
Albumin: 3.5 g/dL (ref 3.5–5.0)
Anion gap: 19 — ABNORMAL HIGH (ref 5–15)
BUN: 69 mg/dL — ABNORMAL HIGH (ref 8–23)
CO2: 24 mmol/L (ref 22–32)
Calcium: 9.8 mg/dL (ref 8.9–10.3)
Chloride: 96 mmol/L — ABNORMAL LOW (ref 98–111)
Creatinine, Ser: 9.27 mg/dL — ABNORMAL HIGH (ref 0.61–1.24)
GFR, Estimated: 5 mL/min — ABNORMAL LOW
Glucose, Bld: 92 mg/dL (ref 70–99)
Phosphorus: 6.8 mg/dL — ABNORMAL HIGH (ref 2.5–4.6)
Potassium: 4 mmol/L (ref 3.5–5.1)
Sodium: 138 mmol/L (ref 135–145)

## 2024-04-13 LAB — CBC
HCT: 28.4 % — ABNORMAL LOW (ref 39.0–52.0)
Hemoglobin: 9.3 g/dL — ABNORMAL LOW (ref 13.0–17.0)
MCH: 33.9 pg (ref 26.0–34.0)
MCHC: 32.7 g/dL (ref 30.0–36.0)
MCV: 103.6 fL — ABNORMAL HIGH (ref 80.0–100.0)
Platelets: 178 K/uL (ref 150–400)
RBC: 2.74 MIL/uL — ABNORMAL LOW (ref 4.22–5.81)
RDW: 14.6 % (ref 11.5–15.5)
WBC: 7.9 K/uL (ref 4.0–10.5)
nRBC: 0 % (ref 0.0–0.2)

## 2024-04-13 LAB — HEPATITIS B SURFACE ANTIBODY, QUANTITATIVE: Hep B S AB Quant (Post): <3.5 m[IU]/mL — ABNORMAL LOW

## 2024-04-13 LAB — MISC LABCORP TEST (SEND OUT): Labcorp test code: 6510

## 2024-04-13 MED ORDER — PENTAFLUOROPROP-TETRAFLUOROETH EX AERO
INHALATION_SPRAY | CUTANEOUS | Status: AC
  Filled 2024-04-13: qty 30

## 2024-04-13 NOTE — Evaluation (Signed)
 Physical Therapy Evaluation Patient Details Name: Luis Miller. MRN: [id number] DOB: 11/06/1945 Today's Date: 04/13/2024  History of Present Illness  Luis Miller. is Luis 79 year old male with ESRD on HD MWF, hypertension, hyperlipidemia, junctional bradycardia, stroke, gout, memory loss, bipolar depression, BPH, OSA, anemia, thrombocytopenia, Crohn's disease, ulcerative colitis who presents after being found down and missed dialysis. Admitted with uremia.  Clinical Impression  Patient admitted with the above. PTA, patient lives home alone and was independent with no AD. Patient oriented to self during session. Patient presents with weakness, impaired balance, and decreased activity tolerance. Required minA+2 for bed mobility and min-modA+2 to stand from EOB x 3. Ambulated around the bed to recliner with minA+2 and HHAx2 (face to face with patient). On RA throughout with spO2 92% with activity. Patient will benefit from skilled PT services during acute stay to address listed deficits. Patient will benefit from ongoing therapy at discharge to maximize functional independence and safety.         If plan is discharge home, recommend the following: Luis lot of help with walking and/or transfers;Luis lot of help with bathing/dressing/bathroom;Assistance with cooking/housework;Direct supervision/assist for medications management;Direct supervision/assist for financial management;Assist for transportation;Supervision due to cognitive status   Can travel by private vehicle        Equipment Recommendations Rolling Soledad Budreau (2 wheels);BSC/3in1  Recommendations for Other Services       Functional Status Assessment Patient has had Luis recent decline in their functional status and demonstrates the ability to make significant improvements in function in Luis reasonable and predictable amount of time.     Precautions / Restrictions Precautions Precautions: Fall Recall of Precautions/Restrictions:  Impaired Restrictions Weight Bearing Restrictions Per Provider Order: No      Mobility  Bed Mobility Overal bed mobility: Needs Assistance Bed Mobility: Supine to Sit     Supine to sit: Min assist, +2 for physical assistance          Transfers Overall transfer level: Needs assistance Equipment used: 2 person hand held assist Transfers: Sit to/from Stand Sit to Stand: Mod assist, +2 safety/equipment                Ambulation/Gait Ambulation/Gait assistance: Min assist, +2 safety/equipment Gait Distance (Feet): 15 Feet Assistive device: 2 person hand held assist Gait Pattern/deviations: Decreased stride length, Step-to pattern Gait velocity: decreased     General Gait Details: unsteady with ambulation requiring minA to steady and +2 for safety  Stairs            Wheelchair Mobility     Tilt Bed    Modified Rankin (Stroke Patients Only)       Balance Overall balance assessment: Needs assistance Sitting-balance support: No upper extremity supported, Feet supported Sitting balance-Leahy Scale: Fair     Standing balance support: No upper extremity supported, During functional activity Standing balance-Leahy Scale: Poor                               Pertinent Vitals/Pain Pain Assessment Pain Assessment: No/denies pain    Home Living Family/patient expects to be discharged to:: Private residence Living Arrangements: Alone   Type of Home: House Home Access: Level entry       Home Layout: One level Home Equipment: [employer] (2 wheels)      Prior Function Prior Level of Function : Independent/Modified Independent  Mobility Comments: no AD, drives to HD       Extremity/Trunk Assessment   Upper Extremity Assessment Upper Extremity Assessment: Generalized weakness    Lower Extremity Assessment Lower Extremity Assessment: Generalized weakness       Communication   Communication Communication:  Impaired Factors Affecting Communication: Hearing impaired    Cognition Arousal: Alert Behavior During Therapy: Flat affect   PT - Cognitive impairments: Orientation, Awareness, Memory, Attention, Sequencing, Problem solving                       PT - Cognition Comments: Luis&Ox1, Following commands: Impaired Following commands impaired: Follows multi-step commands inconsistently     Cueing Cueing Techniques: Verbal cues     General Comments General comments (skin integrity, edema, etc.): SpO2 92% on RA with activity    Exercises     Assessment/Plan    PT Assessment Patient needs continued PT services  PT Problem List Decreased strength;Decreased activity tolerance;Decreased balance;Decreased mobility;Decreased knowledge of use of DME;Decreased cognition;Decreased safety awareness;Decreased knowledge of precautions;Cardiopulmonary status limiting activity       PT Treatment Interventions DME instruction;Gait training;Functional mobility training;Neuromuscular re-education;Therapeutic activities;Therapeutic exercise;Balance training;Patient/family education    PT Goals (Current goals can be found in the Care Plan section)  Acute Rehab PT Goals Patient Stated Goal: did not state PT Goal Formulation: Patient unable to participate in goal setting Time For Goal Achievement: 04/27/24 Potential to Achieve Goals: Fair    Frequency Min 2X/week     Co-evaluation PT/OT/SLP Co-Evaluation/Treatment: Yes Reason for Co-Treatment: Necessary to address cognition/behavior during functional activity;For patient/therapist safety PT goals addressed during session: Mobility/safety with mobility OT goals addressed during session: ADL's and self-care       AM-PAC PT 6 Clicks Mobility  Outcome Measure Help needed turning from your back to your side while in Luis flat bed without using bedrails?: Luis Little Help needed moving from lying on your back to sitting on the side of Luis flat bed  without using bedrails?: Luis Little Help needed moving to and from Luis bed to Luis chair (including Luis wheelchair)?: Luis Lot Help needed standing up from Luis chair using your arms (e.g., wheelchair or bedside chair)?: Luis Lot Help needed to walk in hospital room?: Luis Lot Help needed climbing 3-5 steps with Luis railing? : Luis Lot 6 Click Score: 14    End of Session   Activity Tolerance: Patient tolerated treatment well Patient left: in chair;with call bell/phone within reach;with chair alarm set Nurse Communication: Mobility status PT Visit Diagnosis: Unsteadiness on feet (R26.81);Muscle weakness (generalized) (M62.81);History of falling (Z91.81);Difficulty in walking, not elsewhere classified (R26.2)    Time: 8657-8575 PT Time Calculation (min) (ACUTE ONLY): 42 min   Charges:   PT Evaluation $PT Eval Moderate Complexity: 1 Mod PT Treatments $Therapeutic Activity: 8-22 mins PT General Charges $$ ACUTE PT VISIT: 1 Visit         Luis Miller, PT, DPT Physical Therapist - [facility]  [facility]   Luis Miller Luis Miller 04/13/2024, 3:36 PM

## 2024-04-13 NOTE — Progress Notes (Signed)
 Pt receives outpt HD at [facility] on MWF at 11:45am. Navigator following to assist with any HD needs.  Suzen Satchel Dialysis Navigator [phone]

## 2024-04-13 NOTE — Evaluation (Addendum)
 Occupational Therapy Evaluation Patient Details Name: Luis Miller. MRN: [id number] DOB: 1945-11-27 Today's Date: 04/13/2024   History of Present Illness   Keahi Mccarney. is a 79 year old male with ESRD on HD MWF, hypertension, hyperlipidemia, junctional bradycardia, stroke, gout, memory loss, bipolar depression, BPH, OSA, anemia, thrombocytopenia, Crohn's disease, ulcerative colitis who presents after being found down and missed dialysis. Admitted with uremia.   Clinical Impressions Mr Thielman was seen for OT evaluation this date. Prior to hospital admission, pt was IND. Pt lives alone in level entry house. Disoriented to situation. Pt presents to acute OT demonstrating impaired ADL performance and functional mobility 2/2 decreased activity tolerance and functional strength/balance deficits. Pt currently requires MIN A standing grooming tasks, tolerates ~5 min + 5 min activities with seated rest breaks - assist for intermittent balance deficits. MIN A x2 + HHA for ADL t/f ~15 ft, SpO2 92% on RA with activity. Pt would benefit from skilled OT to address noted impairments and functional limitations (see below for any additional details). Upon hospital discharge, recommend OT follow up <3 hours/day.    If plan is discharge home, recommend the following:   A little help with bathing/dressing/bathroom;A lot of help with walking and/or transfers;Help with stairs or ramp for entrance     Functional Status Assessment   Patient has had a recent decline in their functional status and demonstrates the ability to make significant improvements in function in a reasonable and predictable amount of time.     Equipment Recommendations   BSC/3in1     Recommendations for Other Services         Precautions/Restrictions   Precautions Precautions: Fall Recall of Precautions/Restrictions: Impaired Restrictions Weight Bearing Restrictions Per Provider Order: No     Mobility Bed  Mobility Overal bed mobility: Needs Assistance Bed Mobility: Supine to Sit     Supine to sit: Min assist, +2 for physical assistance          Transfers Overall transfer level: Needs assistance Equipment used: 2 person hand held assist Transfers: Sit to/from Stand Sit to Stand: Mod assist, +2 safety/equipment                  Balance Overall balance assessment: Needs assistance Sitting-balance support: No upper extremity supported, Feet supported Sitting balance-Leahy Scale: Fair     Standing balance support: No upper extremity supported, During functional activity Standing balance-Leahy Scale: Poor                             ADL either performed or assessed with clinical judgement   ADL Overall ADL's : Needs assistance/impaired                                       General ADL Comments: MIN A standing grooming tasks, requires seated rest breaks t/o, assist for intermittent balance deficits. MIN A x2 + HHA for ADL t/f ~15 ft      Vision         Perception         Praxis         Pertinent Vitals/Pain Pain Assessment Pain Assessment: No/denies pain     Extremity/Trunk Assessment Upper Extremity Assessment Upper Extremity Assessment: Generalized weakness   Lower Extremity Assessment Lower Extremity Assessment: Generalized weakness       Communication Communication Communication: Impaired Factors  Affecting Communication: Hearing impaired   Cognition Arousal: Alert Behavior During Therapy: Flat affect Cognition: Cognition impaired   Orientation impairments: Situation       Executive functioning impairment (select all impairments): Initiation                   Following commands: Impaired Following commands impaired: Follows multi-step commands inconsistently     Cueing  General Comments   Cueing Techniques: Verbal cues  SpO2 92% on RA with activity   Exercises     Shoulder Instructions       Home Living Family/patient expects to be discharged to:: Private residence Living Arrangements: Alone   Type of Home: House Home Access: Level entry     Home Layout: One level               Home Equipment: Agricultural [employer]ant (2 wheels)          Prior Functioning/Environment Prior Level of Function : Independent/Modified Independent             Mobility Comments: no AD, drives to HD      OT Problem List: Decreased strength;Decreased range of motion;Decreased activity tolerance;Impaired balance (sitting and/or standing);Decreased safety awareness   OT Treatment/Interventions: Self-care/ADL training;Therapeutic exercise;DME and/or AE instruction;Energy conservation;Therapeutic activities;Patient/family education      OT Goals(Current goals can be found in the care plan section)   Acute Rehab OT Goals Patient Stated Goal: to return to PLOF OT Goal Formulation: With patient Time For Goal Achievement: 04/27/24 Potential to Achieve Goals: Good ADL Goals Pt Will Perform Grooming: with modified independence;standing Pt Will Perform Lower Body Dressing: with modified independence;sit to/from stand Pt Will Transfer to Toilet: with modified independence;ambulating;regular height toilet   OT Frequency:  Min 2X/week    Co-evaluation PT/OT/SLP Co-Evaluation/Treatment: Yes Reason for Co-Treatment: Necessary to address cognition/behavior during functional activity;For patient/therapist safety PT goals addressed during session: Mobility/safety with mobility OT goals addressed during session: ADL's and self-care      AM-PAC OT 6 Clicks Daily Activity     Outcome Measure Help from another person eating meals?: None Help from another person taking care of personal grooming?: A Little Help from another person toileting, which includes using toliet, bedpan, or urinal?: A Little Help from another person bathing (including washing, rinsing, drying)?: A Lot Help from  another person to put on and taking off regular upper body clothing?: A Little Help from another person to put on and taking off regular lower body clothing?: A Lot 6 Click Score: 17   End of Session Nurse Communication: Mobility status  Activity Tolerance: Patient tolerated treatment well Patient left: in chair;with call bell/phone within reach;with chair alarm set  OT Visit Diagnosis: Other abnormalities of gait and mobility (R26.89);Muscle weakness (generalized) (M62.81)                Time: 8656-8574 OT Time Calculation (min): 42 min Charges:  OT General Charges $OT Visit: 1 Visit OT Evaluation $OT Eval Low Complexity: 1 Low OT Treatments $Self Care/Home Management : 8-22 mins  Elston Slot, M.S. OTR/L  04/13/24, 3:35 PM  ascom [phone]

## 2024-04-13 NOTE — Progress Notes (Signed)
 Pt alert, tolerated dialysis tx well. No c/o at this time. Remains on 3l/min O2. Call to give report, nurse stated I'm dealing with a lot right now. Tried to give Hand off to Jon Pouch, [facility].

## 2024-04-13 NOTE — Progress Notes (Signed)
 " [facility]  ROUNDING NOTE   Subjective:   Luis Miller. is a 79 year old male with end-stage renal disease on hemodialysis, hypertension, gout, depression, history of melanoma, Crohn's disease, history of stroke, bipolar disorder. 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 . Patient seen and evaluated during dialysis   HEMODIALYSIS FLOWSHEET:  Blood Flow Rate (mL/min): 350 mL/min Arterial Pressure (mmHg): -211.91 mmHg Venous Pressure (mmHg): 187.26 mmHg TMP (mmHg): 3.03 mmHg Ultrafiltration Rate (mL/min): 0 mL/min Dialysate Flow Rate (mL/min): 300 ml/min  Mentation has improved 3L [place]   Objective:  Vital signs in last 24 hours:  Temp:  [98.4 F (36.9 C)-99.9 F (37.7 C)] 99 F (37.2 C) (01/23 0750) Pulse Rate:  [97-123] 123 (01/23 1130) Resp:  [18-23] 21 (01/23 1130) BP: (113-144)/(64-103) 129/89 (01/23 1130) SpO2:  [94 %-100 %] 95 % (01/23 1130) Weight:  [85.3 kg-88.1 kg] 85.3 kg (01/23 0750)  Weight change: -0.5 kg Filed Weights   04/12/24 1109 04/13/24 0358 04/13/24 0750  Weight: 88 kg 88.1 kg 85.3 kg    Intake/Output: I/O last 3 completed shifts: In: 240 [P.O.:240] Out: 1850 [Urine:650; Other:1200]   Intake/Output this shift:  No intake/output data recorded.  Physical Exam: General: NAD, resting comfortably  Head: Normocephalic, atraumatic. Moist oral mucosal membranes  Eyes: Anicteric  Lungs:  Clear to auscultation, normal effort  Heart: Regular rate and rhythm  Abdomen:  Soft, nontender  Extremities:  Trace peripheral edema.  Neurologic: Awake, alert  Skin: Warm,dry, no rash  Access:  Left AVF    Basic Metabolic Panel: Recent Labs  Lab 04/11/24 1800 04/11/24 2319 04/12/24 0425 04/12/24 1302 04/13/24 0812  NA 140  --  140 137 138  K 5.7* 5.8* 5.5* 3.7 4.0  CL 99  --  99 95* 96*  CO2 22  --  21* 27 24  GLUCOSE 114*  --  96 100* 92  BUN 101*  --  104* 52* 69*  CREATININE 12.90*  --  13.40* 7.29* 9.27*  CALCIUM  11.0*  --  10.8* 10.2 9.8  MG 3.5*  --   --  2.8*  --   PHOS  --   --   --  4.8* 6.8*    Liver Function Tests: Recent Labs  Lab 04/11/24 1800 04/12/24 1302 04/13/24 0812  AST 22 25  --   ALT 8 7  --   ALKPHOS 70 75  --   BILITOT 0.6 1.0  --   PROT 6.7 7.0  --   ALBUMIN 4.1 3.9 3.5   No results for input(s): LIPASE, AMYLASE in the last 168 hours. No results for input(s): AMMONIA in the last 168 hours.  CBC: Recent Labs  Lab 04/11/24 1800 04/12/24 0425 04/13/24 0812  WBC 9.0 7.9 7.9  HGB 9.1* 9.8* 9.3*  HCT 28.6* 31.0* 28.4*  MCV 107.5* 108.4* 103.6*  PLT 135* 156 178    Cardiac Enzymes: Recent Labs  Lab 04/11/24 2319  CKTOTAL 232    BNP: Invalid input(s): POCBNP  CBG: No results for input(s): GLUCAP in the last 168 hours.  Microbiology: Results for orders placed or performed in visit on 05/25/23  Microscopic Examination     Status:  Abnormal   Collection Time: 05/25/23  4:05 PM   Urine  Result Value Ref Range Status   WBC, UA 11-30 (A) 0 - 5 /hpf Final   RBC, Urine 3-10 (A) 0 - 2 /hpf Final   Epithelial Cells (non renal) 0-10 0 - 10 /hpf Final   Casts Present (A) None seen /lpf Final   Cast Type Hyaline casts N/A Final   Mucus, UA Present (A) Not Estab. Final   Bacteria, UA Moderate (A) None seen/Few Final    Coagulation Studies: No results for input(s): LABPROT, INR in the last 72 hours.  Urinalysis: Recent Labs    04/11/24 2317  COLORURINE YELLOW*  LABSPEC 1.014  PHURINE 5.0  GLUCOSEU NEGATIVE  HGBUR LARGE*  BILIRUBINUR NEGATIVE  KETONESUR NEGATIVE  PROTEINUR 100*  NITRITE NEGATIVE   LEUKOCYTESUR TRACE*      Imaging: CT HEAD WO CONTRAST ( ) Result Date: 04/12/2024 CLINICAL DATA:  Altered mental status. EXAM: CT HEAD WITHOUT CONTRAST TECHNIQUE: Contiguous axial images were obtained from the base of the skull through the vertex without intravenous contrast. RADIATION DOSE REDUCTION: This exam was performed according to the departmental dose-optimization program which includes automated exposure control, adjustment of the mA and/or kV according to patient size and/or use of iterative reconstruction technique. COMPARISON:  04/11/2024 and 11/21/2022 FINDINGS: Brain: Ventricles, cisterns and other CSF spaces are within normal. There is no mass, mass effect, shift of midline structures or acute hemorrhage. No acute infarction. Vascular: No hyperdense vessel or unexpected calcification. Skull: Normal. Negative for fracture or focal lesion. Sinuses/Orbits: No acute finding. Other: None. IMPRESSION: No acute findings. Electronically Signed   By: Toribio Agreste M.D.   On: 04/12/2024 15:28   CT Cervical Spine Wo Contrast Result Date: 04/11/2024 EXAM: CT CERVICAL SPINE WITHOUT CONTRAST 04/11/2024 05:19:06 PM TECHNIQUE: CT of the cervical spine was performed without the administration of intravenous contrast. Multiplanar reformatted images are provided for review. Automated exposure control, iterative reconstruction, and/or weight based adjustment of the mA/kV was utilized to reduce the radiation dose to as low as reasonably achievable. COMPARISON: 03/30/2006 CLINICAL HISTORY: fall FINDINGS: BONES AND ALIGNMENT: Straightening of the normal cervical lordosis. There is trace degenerative anterolisthesis of C3 on C4 and C4 on C5. No acute fracture or traumatic malalignment. DEGENERATIVE CHANGES: Disc space narrowing most pronounced at C5-C6 and C6-C7. There is no high grade osseous spinal canal stenosis. Foraminal stenosis greatest at C3-C4 and C4-C5. SOFT TISSUES: No prevertebral soft tissue  swelling. LUNGS: There are patchy ground glass opacities in the right upper lobe concerning for infection. IMPRESSION: 1. No acute fracture or traumatic malalignment of the cervical spine. 2. Patchy ground glass opacities in the right upper lobe concerning for infection. Electronically signed by: Donnice Mania MD 04/11/2024 05:47 PM EST RP Workstation: [id number]   CT HEAD WO CONTRAST Result Date: 04/11/2024 EXAM: CT HEAD WITHOUT CONTRAST 04/11/2024 05:19:06 PM TECHNIQUE: CT of the head was performed without the administration of intravenous contrast. Automated exposure control, iterative reconstruction, and/or weight based adjustment of the mA/kV was utilized to reduce the radiation dose to as low as reasonably achievable. COMPARISON: 11/21/2022 CLINICAL HISTORY: fall fall fall fall fall FINDINGS: BRAIN AND VENTRICLES: No acute hemorrhage. No evidence of acute infarct. No hydrocephalus. No extra-axial collection. No mass effect or midline shift. Mild chronic microvascular ischemic changes. Mild parenchymal volume loss. Atherosclerosis of the carotid siphons. ORBITS: Bilateral lens replacement. SINUSES: No acute abnormality. SOFT TISSUES AND SKULL: No acute soft tissue abnormality. No skull fracture. IMPRESSION:  1. No acute intracranial abnormality. Electronically signed by: Donnice Mania MD 04/11/2024 05:26 PM EST RP Workstation: [id number]   DG Chest Port 1 View Result Date: 04/11/2024 CLINICAL DATA:  Hypoxia.  Fall. EXAM: PORTABLE CHEST 1 VIEW COMPARISON:  11/21/2022 FINDINGS: Patient is rotated to the right. Lungs are hypoinflated without focal airspace consolidation or effusion. Cardiomediastinal silhouette is within normal. Multiple old right posterolateral rib fractures. Several old lateral left rib fractures. IMPRESSION: Hypoinflation without acute cardiopulmonary disease. Electronically Signed   By: Toribio Agreste M.D.   On: 04/11/2024 15:48   DG Hand Complete Right Result Date: 04/11/2024 CLINICAL  DATA:  Fall with right hand pain. EXAM: RIGHT HAND - COMPLETE 3+ VIEW COMPARISON:  None Available. FINDINGS: Degenerative changes over the first carpometacarpal joint. Mild decreased bone mineralization. Bony alignment is normal. There is no evidence of acute fracture or dislocation. IMPRESSION: 1. No acute findings. 2. Degenerative changes over the first carpometacarpal joint. Electronically Signed   By: Toribio Agreste M.D.   On: 04/11/2024 15:46   DG Hip Unilat W or Wo Pelvis 2-3 Views Right Result Date: 04/11/2024 CLINICAL DATA:  Fall. EXAM: DG HIP (WITH OR WITHOUT PELVIS) 2-3V RIGHT COMPARISON:  None Available. FINDINGS: There is no evidence of acute fracture or dislocation. Mild degenerate changes of the spine and sacroiliac joints. Penile prosthesis. Remainder of the exam is unremarkable. IMPRESSION: No acute findings. Electronically Signed   By: Toribio Agreste M.D.   On: 04/11/2024 15:45     Medications:    anticoagulant sodium citrate       Chlorhexidine  Gluconate Cloth  6 each Topical Q0600   gabapentin   100 mg Oral Daily   heparin   5,000 Units Subcutaneous Q8H   loratadine   10 mg Oral Daily   multivitamin with minerals  1 tablet Oral Daily   patiromer   25.2 g Oral Daily   primidone   25 mg Oral QHS   rosuvastatin   10 mg Oral Daily   acetaminophen , albuterol , alteplase , anticoagulant sodium citrate , dextromethorphan-guaiFENesin , feeding supplement (NEPRO CARB STEADY), heparin , hydrALAZINE , lidocaine  (PF), lidocaine -prilocaine , ondansetron  (ZOFRAN ) IV, pentafluoroprop-tetrafluoroeth  Assessment/ Plan:  [name]. is a 79 y.o.  male with end-stage renal disease on hemodialysis, hypertension, gout, depression, history of melanoma, Crohn's disease, history of stroke, bipolar disorder. Patient presents to ED after suffering a fall and has been admitted for Mixed hyperlipidemia [E78.2] Uremia [N19] Weakness [R53.1] Insomnia due to medical condition [G47.01] Bipolar disorder, in  full remission, most recent episode depressed [F31.76]   End stage renal disease on hemodialysis. Last treatment received on 1/14 due to access. Patient received dialysis today, UF goal 1L as tolerated. Next treatment scheduled for Monday. Will assess need for additional treatment tomorrow.   2. Anemia of chronic kidney disease Lab Results  Component Value Date   HGB 9.3 (L) 04/13/2024    Hgb within optimal range. No need for ESA at this time.   3. Secondary Hyperparathyroidism: with outpatient labs: PTH 235, phosphorus 6.0, calcium  10.4 on 04/13/23.   Lab Results  Component Value Date   CALCIUM  9.8 04/13/2024   CAION 1.17 11/05/2021   PHOS 6.8 (H) 04/13/2024    Phos remains elevated, likely due to lack of treatments. Will improve with consistent dialysis.   4. Fall, unwitnessed. Imaging negative for acute fracture. PT/OT.    LOS: 1 Cande Mastropietro 1/23/202611:48 AM   "

## 2024-04-13 NOTE — Progress Notes (Signed)
 Echocardiogram 2D Echocardiogram has been performed.  Luis Miller 04/13/2024, 11:40 PM

## 2024-04-13 NOTE — Progress Notes (Signed)
 " PROGRESS NOTE    Luis Miller.  FMW:[id number] DOB: May 01, 1945 DOA: 04/11/2024 PCP: Lateef, Munsoor, MD  Chief Complaint  Patient presents with   [facility]:  Luis Miller. is a 79 year old male with ESRD on HD MWF, hypertension, hyperlipidemia, junctional bradycardia, stroke, gout, memory loss, bipolar depression, BPH, OSA, anemia, thrombocytopenia, Crohn's disease, ulcerative colitis who presents after being found down. Patient was meant to have dialysis on day of arrival but did not arrive.  They performed a wellness check and patient was found on the ground outside of his home.  He reports he had been there for about an hour with generalized weakness.  He does not believe he fell and endorses a slow descent to the ground.  Head CT on arrival was without acute intracranial abnormality.  Patient has multiple lab derangements on arrival consistent with being overdue for dialysis including potassium of 5.7, calcium  11, bicarb 22, creatinine 12.9.  CXR negative for infiltration.  CT C-spine shows patchy ground glass infiltrates in the right upper lobe.  Patient was admitted and nephrology was consulted.  Patient received dialysis for the first time on 1/22.  He had significant improvement in labs afterwards.  Subjective: Evaluated patient during dialysis today.  He is now able to report that he is at [facility], but still unable to answer other questions.  Does require significant prompting.  Objective: Vitals:   04/13/24 1130 04/13/24 1200 04/13/24 1215 04/13/24 1248  BP: 129/89  137/87 (!) 125/96  Pulse: (!) 123 (!) 122 (!) 121 (!) 108  Resp: (!) 21 17 18 17   Temp:   98.6 F (37 C) (!) 97.5 F (36.4 C)  TempSrc:   Oral Oral  SpO2: 95% 96% 94% 100%  Weight:   85.3 kg   Height:        Intake/Output Summary (Last 24 hours) at 04/13/2024 1353 Last data filed at 04/13/2024 0547 Gross per 24 hour  Intake 240 ml  Output 650 ml  Net -410 ml   Filed  Weights   04/13/24 0358 04/13/24 0750 04/13/24 1215  Weight: 88.1 kg 85.3 kg 85.3 kg    Examination: General exam: Appears calm and comfortable Respiratory system: No work of breathing, symmetric chest wall expansion Cardiovascular system: S1 & S2 heard, RRR.  Gastrointestinal system: Abdomen is nondistended, soft and nontender.  Neuro: Oriented to self and place.  Assessment & Plan:  Principal Problem:   Uremia Active Problems:   Junctional bradycardia   Hyperkalemia   Hypercalcemia   Hypermagnesemia   Fall at home, initial encounter   Myocardial injury   HTN (hypertension)   Hyperlipidemia   ESRD on hemodialysis (HCC)   Thrombocytopenia   Anemia in ESRD (end-stage renal disease) (HCC)   Bipolar depression (HCC)   Obstructive sleep apnea   Memory loss   Overweight (BMI 25.0-29.9)   QT prolongation    Uremia ESRD on HD Electrolyte disturbances including hyperkalemia, hypercalcemia, hypomagnesemia - Last dialysis 1 week prior to arrival.  Patient appears to have been having some transportation concerns - Patient with uremia on arrival, potassium 5.7, magnesium  3.5, BUN 101 - Now status post dialysis x 2.  Mental status is improving some.  Uremia will be slow to resolve - Per nephrology venous site has been difficult to access but does appear to be functional.  Had recent fistulogram without intervention needed - Will require ongoing HD - Labs improving.  Continue  to monitor CMP  Prolonged QT interval - QTc on EKG 1/22: 640 (after HD) - repeat EKG pending today - Hold all antipsychotic medications medications that could cause prolonged Qtc - 1/22 EKG with Afib RVR 110s.  Received 2.5 mg IVP metoprolol .  Had brief improvement.  May require additional dose today. - Continue close monitoring on telemetry - Echocardiogram ordered, pending  Metabolic encephalopathy - Presumed secondary to uremia.  Hopefully will improve with continued HD - Some improvement today - 2  head CT is without acute intracranial abnormality - Continue to monitor closely - Hold all psych medications at this time - PT/OT - Frequent reorientation - Precautions - Will precautions  Junctional bradycardia - Chronic.  Heart rate regularly in the 50s. - Patient has actually been elevated throughout the day  - Avoid scheduled nodal blockers  Fall at home, initial encounter - Trauma scans negative for acute injury - PT/OT  Elevated troponin - Troponin 114, --> 122 no chest pain - Likely demand ischemia with decreased clearance due to ESRD - Continue statin -Echo still pending  Hypertension - Resume home meds  Hyperlipidemia - Continue statin  Thrombocytopenia, chronic - Platelets stable - Trend CBC  Anemia in ESRD - Hemoglobin 9.1 - Follow CBC, no acute bleeding  Bipolar depression - Currently with prolonged QTc.  Hold all psych medications at this time - Lithium  level is elevated.  Hold lithium   OSA - CPAP at night  Memory loss - On donepezil  at home  Body mass index is 30.35 kg/m. Obesity Class 1 - Outpatient follow up for lifestyle modification and risk factor management   DVT prophylaxis: Heparin    Code Status: Full Code Disposition:  Inpt pending clinical resolution  Consultants:    Procedures:    Antimicrobials:  Anti-infectives (From admission, onward)    None       Data Reviewed: I have personally reviewed following labs and imaging studies CBC: Recent Labs  Lab 04/11/24 1800 04/12/24 0425 04/13/24 0812  WBC 9.0 7.9 7.9  HGB 9.1* 9.8* 9.3*  HCT 28.6* 31.0* 28.4*  MCV 107.5* 108.4* 103.6*  PLT 135* 156 178   Basic Metabolic Panel: Recent Labs  Lab 04/11/24 1800 04/11/24 2319 04/12/24 0425 04/12/24 1302 04/13/24 0812  NA 140  --  140 137 138  K 5.7* 5.8* 5.5* 3.7 4.0  CL 99  --  99 95* 96*  CO2 22  --  21* 27 24  GLUCOSE 114*  --  96 100* 92  BUN 101*  --  104* 52* 69*  CREATININE 12.90*  --  13.40* 7.29* 9.27*   CALCIUM  11.0*  --  10.8* 10.2 9.8  MG 3.5*  --   --  2.8*  --   PHOS  --   --   --  4.8* 6.8*   GFR: Estimated Creatinine Clearance: 6.6 mL/min (A) (by C-G formula based on SCr of 9.27 mg/dL (H)). Liver Function Tests: Recent Labs  Lab 04/11/24 1800 04/12/24 1302 04/13/24 0812  AST 22 25  --   ALT 8 7  --   ALKPHOS 70 75  --   BILITOT 0.6 1.0  --   PROT 6.7 7.0  --   ALBUMIN 4.1 3.9 3.5   CBG: No results for input(s): GLUCAP in the last 168 hours.  No results found for this or any previous visit (from the past 240 hours).   Radiology Studies: CT HEAD WO CONTRAST ( ) Result Date: 04/12/2024 CLINICAL DATA:  Altered mental status.  EXAM: CT HEAD WITHOUT CONTRAST TECHNIQUE: Contiguous axial images were obtained from the base of the skull through the vertex without intravenous contrast. RADIATION DOSE REDUCTION: This exam was performed according to the departmental dose-optimization program which includes automated exposure control, adjustment of the mA and/or kV according to patient size and/or use of iterative reconstruction technique. COMPARISON:  04/11/2024 and 11/21/2022 FINDINGS: Brain: Ventricles, cisterns and other CSF spaces are within normal. There is no mass, mass effect, shift of midline structures or acute hemorrhage. No acute infarction. Vascular: No hyperdense vessel or unexpected calcification. Skull: Normal. Negative for fracture or focal lesion. Sinuses/Orbits: No acute finding. Other: None. IMPRESSION: No acute findings. Electronically Signed   By: Toribio Agreste M.D.   On: 04/12/2024 15:28   CT Cervical Spine Wo Contrast Result Date: 04/11/2024 EXAM: CT CERVICAL SPINE WITHOUT CONTRAST 04/11/2024 05:19:06 PM TECHNIQUE: CT of the cervical spine was performed without the administration of intravenous contrast. Multiplanar reformatted images are provided for review. Automated exposure control, iterative reconstruction, and/or weight based adjustment of the mA/kV was  utilized to reduce the radiation dose to as low as reasonably achievable. COMPARISON: 03/30/2006 CLINICAL HISTORY: fall FINDINGS: BONES AND ALIGNMENT: Straightening of the normal cervical lordosis. There is trace degenerative anterolisthesis of C3 on C4 and C4 on C5. No acute fracture or traumatic malalignment. DEGENERATIVE CHANGES: Disc space narrowing most pronounced at C5-C6 and C6-C7. There is no high grade osseous spinal canal stenosis. Foraminal stenosis greatest at C3-C4 and C4-C5. SOFT TISSUES: No prevertebral soft tissue swelling. LUNGS: There are patchy ground glass opacities in the right upper lobe concerning for infection. IMPRESSION: 1. No acute fracture or traumatic malalignment of the cervical spine. 2. Patchy ground glass opacities in the right upper lobe concerning for infection. Electronically signed by: Donnice Mania MD 04/11/2024 05:47 PM EST RP Workstation: [id number]   CT HEAD WO CONTRAST Result Date: 04/11/2024 EXAM: CT HEAD WITHOUT CONTRAST 04/11/2024 05:19:06 PM TECHNIQUE: CT of the head was performed without the administration of intravenous contrast. Automated exposure control, iterative reconstruction, and/or weight based adjustment of the mA/kV was utilized to reduce the radiation dose to as low as reasonably achievable. COMPARISON: 11/21/2022 CLINICAL HISTORY: fall fall fall fall fall FINDINGS: BRAIN AND VENTRICLES: No acute hemorrhage. No evidence of acute infarct. No hydrocephalus. No extra-axial collection. No mass effect or midline shift. Mild chronic microvascular ischemic changes. Mild parenchymal volume loss. Atherosclerosis of the carotid siphons. ORBITS: Bilateral lens replacement. SINUSES: No acute abnormality. SOFT TISSUES AND SKULL: No acute soft tissue abnormality. No skull fracture. IMPRESSION: 1. No acute intracranial abnormality. Electronically signed by: Donnice Mania MD 04/11/2024 05:26 PM EST RP Workstation: [id number]   DG Chest Port 1 View Result Date:  04/11/2024 CLINICAL DATA:  Hypoxia.  Fall. EXAM: PORTABLE CHEST 1 VIEW COMPARISON:  11/21/2022 FINDINGS: Patient is rotated to the right. Lungs are hypoinflated without focal airspace consolidation or effusion. Cardiomediastinal silhouette is within normal. Multiple old right posterolateral rib fractures. Several old lateral left rib fractures. IMPRESSION: Hypoinflation without acute cardiopulmonary disease. Electronically Signed   By: Toribio Agreste M.D.   On: 04/11/2024 15:48   DG Hand Complete Right Result Date: 04/11/2024 CLINICAL DATA:  Fall with right hand pain. EXAM: RIGHT HAND - COMPLETE 3+ VIEW COMPARISON:  None Available. FINDINGS: Degenerative changes over the first carpometacarpal joint. Mild decreased bone mineralization. Bony alignment is normal. There is no evidence of acute fracture or dislocation. IMPRESSION: 1. No acute findings. 2. Degenerative changes over the first carpometacarpal joint. Electronically  Signed   By: Toribio Agreste M.D.   On: 04/11/2024 15:46   DG Hip Unilat W or Wo Pelvis 2-3 Views Right Result Date: 04/11/2024 CLINICAL DATA:  Fall. EXAM: DG HIP (WITH OR WITHOUT PELVIS) 2-3V RIGHT COMPARISON:  None Available. FINDINGS: There is no evidence of acute fracture or dislocation. Mild degenerate changes of the spine and sacroiliac joints. Penile prosthesis. Remainder of the exam is unremarkable. IMPRESSION: No acute findings. Electronically Signed   By: Toribio Agreste M.D.   On: 04/11/2024 15:45    Scheduled Meds:  Chlorhexidine  Gluconate Cloth  6 each Topical Q0600   gabapentin   100 mg Oral Daily   heparin   5,000 Units Subcutaneous Q8H   loratadine   10 mg Oral Daily   multivitamin with minerals  1 tablet Oral Daily   patiromer   25.2 g Oral Daily   primidone   25 mg Oral QHS   rosuvastatin   10 mg Oral Daily   Continuous Infusions:     LOS: 1 day  MDM: Patient is high risk for one or more organ failure.  They necessitate ongoing hospitalization for continued IV  therapies and subsequent lab monitoring. Total time spent interpreting labs and vitals, reviewing the medical record, coordinating care amongst consultants and care team members, directly assessing and discussing care with the patient and/or family: 55 min  Keslyn Teater, DO [facility]  To contact the attending physician between 7A-7P please use Epic Chat. To contact the covering physician during after hours 7P-7A, please review Amion.  04/13/2024, 1:53 PM   *This document has been created with the assistance of dictation software. Please excuse typographical errors. *   "

## 2024-04-14 ENCOUNTER — Encounter: Payer: Self-pay | Admitting: Internal Medicine

## 2024-04-14 DIAGNOSIS — R9431 Abnormal electrocardiogram [ECG] [EKG]: Secondary | ICD-10-CM | POA: Diagnosis not present

## 2024-04-14 DIAGNOSIS — N19 Unspecified kidney failure: Secondary | ICD-10-CM | POA: Diagnosis not present

## 2024-04-14 LAB — MISC LABCORP TEST (SEND OUT): Labcorp test code: 6510

## 2024-04-14 LAB — ECHOCARDIOGRAM COMPLETE
AR max vel: 2.33 cm2
AV Peak grad: 8.4 mmHg
Ao pk vel: 1.45 m/s
Area-P 1/2: 5.97 cm2
Height: 66 in
MV VTI: 2.61 cm2
Weight: 3008.84 [oz_av]

## 2024-04-14 LAB — CBC
HCT: 29.5 % — ABNORMAL LOW (ref 39.0–52.0)
Hemoglobin: 10 g/dL — ABNORMAL LOW (ref 13.0–17.0)
MCH: 34.5 pg — ABNORMAL HIGH (ref 26.0–34.0)
MCHC: 33.9 g/dL (ref 30.0–36.0)
MCV: 101.7 fL — ABNORMAL HIGH (ref 80.0–100.0)
Platelets: 174 10*3/uL (ref 150–400)
RBC: 2.9 MIL/uL — ABNORMAL LOW (ref 4.22–5.81)
RDW: 14.2 % (ref 11.5–15.5)
WBC: 6.6 10*3/uL (ref 4.0–10.5)
nRBC: 0 % (ref 0.0–0.2)

## 2024-04-14 LAB — LITHIUM LEVEL: Lithium Lvl: 0.35 mmol/L — ABNORMAL LOW (ref 0.60–1.20)

## 2024-04-14 LAB — BASIC METABOLIC PANEL WITH GFR
Anion gap: 14 (ref 5–15)
BUN: 48 mg/dL — ABNORMAL HIGH (ref 8–23)
CO2: 26 mmol/L (ref 22–32)
Calcium: 9.6 mg/dL (ref 8.9–10.3)
Chloride: 96 mmol/L — ABNORMAL LOW (ref 98–111)
Creatinine, Ser: 6.3 mg/dL — ABNORMAL HIGH (ref 0.61–1.24)
GFR, Estimated: 8 mL/min — ABNORMAL LOW
Glucose, Bld: 103 mg/dL — ABNORMAL HIGH (ref 70–99)
Potassium: 3.7 mmol/L (ref 3.5–5.1)
Sodium: 137 mmol/L (ref 135–145)

## 2024-04-14 LAB — LACTIC ACID, PLASMA: Lactic Acid, Venous: 1.1 mmol/L (ref 0.5–1.9)

## 2024-04-14 LAB — PHOSPHORUS: Phosphorus: 5.3 mg/dL — ABNORMAL HIGH (ref 2.5–4.6)

## 2024-04-14 LAB — HEPARIN LEVEL (UNFRACTIONATED): Heparin Unfractionated: <0.1 [IU]/mL — ABNORMAL LOW (ref 0.30–0.70)

## 2024-04-14 LAB — PROTIME-INR
INR: 1.2 (ref 0.8–1.2)
Prothrombin Time: 15.5 s — ABNORMAL HIGH (ref 11.4–15.2)

## 2024-04-14 LAB — MAGNESIUM: Magnesium: 2.6 mg/dL — ABNORMAL HIGH (ref 1.7–2.4)

## 2024-04-14 LAB — APTT: aPTT: 35 s (ref 24–36)

## 2024-04-14 LAB — BETA-HYDROXYBUTYRIC ACID: Beta-Hydroxybutyric Acid: 1.24 mmol/L — ABNORMAL HIGH (ref 0.05–0.27)

## 2024-04-14 LAB — BLOOD GAS, VENOUS

## 2024-04-14 MED ORDER — LITHIUM CARBONATE ER 300 MG PO TBCR
300.0000 mg | EXTENDED_RELEASE_TABLET | Freq: Every day | ORAL | Status: DC
  Administered 2024-04-14 – 2024-04-20 (×6): 300 mg via ORAL
  Filled 2024-04-14 (×8): qty 1

## 2024-04-14 MED ORDER — HEPARIN BOLUS VIA INFUSION
2500.0000 [IU] | Freq: Once | INTRAVENOUS | Status: AC
  Administered 2024-04-14: 2500 [IU] via INTRAVENOUS
  Filled 2024-04-14: qty 2500

## 2024-04-14 MED ORDER — METOPROLOL TARTRATE 5 MG/5ML IV SOLN
2.5000 mg | Freq: Once | INTRAVENOUS | Status: AC
  Administered 2024-04-14: 2.5 mg via INTRAVENOUS
  Filled 2024-04-14: qty 5

## 2024-04-14 MED ORDER — HEPARIN (PORCINE) 25000 UT/250ML-% IV SOLN
1750.0000 [IU]/h | INTRAVENOUS | Status: DC
  Administered 2024-04-14: 1200 [IU]/h via INTRAVENOUS
  Administered 2024-04-15: 1400 [IU]/h via INTRAVENOUS
  Administered 2024-04-15: 1700 [IU]/h via INTRAVENOUS
  Administered 2024-04-16 – 2024-04-17 (×3): 1750 [IU]/h via INTRAVENOUS
  Filled 2024-04-14 (×5): qty 250

## 2024-04-14 MED ORDER — DOCUSATE SODIUM 100 MG PO CAPS
100.0000 mg | ORAL_CAPSULE | Freq: Two times a day (BID) | ORAL | Status: DC
  Administered 2024-04-14 – 2024-04-19 (×6): 100 mg via ORAL
  Filled 2024-04-14 (×7): qty 1

## 2024-04-14 MED ORDER — METOPROLOL TARTRATE 25 MG PO TABS
25.0000 mg | ORAL_TABLET | Freq: Three times a day (TID) | ORAL | Status: DC
  Administered 2024-04-14 – 2024-04-18 (×12): 25 mg via ORAL
  Filled 2024-04-14: qty 1
  Filled 2024-04-14: qty 0.5
  Filled 2024-04-14 (×11): qty 1

## 2024-04-14 MED ORDER — HEPARIN BOLUS VIA INFUSION
2000.0000 [IU] | Freq: Once | INTRAVENOUS | Status: AC
  Administered 2024-04-14: 2000 [IU] via INTRAVENOUS
  Filled 2024-04-14: qty 2000

## 2024-04-14 NOTE — Progress Notes (Signed)
"  ° °      Overnight   NAME: Luis Miller Launie Mickey. MRN: [id number] DOB : March 27, 1945    Date of Service   04/14/2024   HPI/Events of Note   HPI:  79-year-old male with ESRD on HD MWF, hypertension, hyperlipidemia, junctional bradycardia, stroke, gout, memory loss, bipolar depression, BPH, OSA, anemia, thrombocytopenia, Crohn's disease, ulcerative colitis who presents after being found down. Patient was meant to have dialysis on day of arrival but did not arrive.  They performed a wellness check and patient was found on the ground outside of his home.  He reports he had been there for about an hour with generalized weakness.  He does not believe he fell and endorses a slow descent to the ground.  Head CT on arrival was without acute intracranial abnormality.  Patient has multiple lab derangements on arrival consistent with being overdue for dialysis including potassium of 5.7, calcium  11, bicarb 22, creatinine 12.9.  CXR negative for infiltration.  CT C-spine shows patchy ground glass infiltrates in the right upper lobe.  Patient was admitted and nephrology was consulted.  Patient received dialysis for the first time on 1/22.  He had significant improvement in labs afterwards.     Overnight:  Rhythm change to Aflutter 110s. BP stable, denies cp, sob, palpitations. EKG ordered. On chart review found to have anion gap of 19.    Interventions/ Plan   EKG- a flutter with prolonged QT/QTC Metoprolol  IV 2.5 mg  Cbc, BMP, lactic acid, beta hydroxy, vbg    Updates     Brianny Soulliere Donati- Aram BSN RN CCRN AGACNP-BC Acute Care Nurse Practitioner [facility]  "
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
## 2024-04-14 NOTE — Progress Notes (Signed)
" °   04/14/24 1308  Vitals  Temp 98.4 F (36.9 C)  BP (!) 130/91  Pulse Rate (!) 123  Resp 19  Weight 85.9 kg  Type of Weight Post-Dialysis  Oxygen Therapy  SpO2 100 %  O2 Device Nasal Cannula  Patient Activity (if Appropriate) In bed  Pulse Oximetry Type Continuous  During Treatment Monitoring  HD Safety Checks Performed Yes  Intra-Hemodialysis Comments Tx completed  Post Treatment  Dialyzer Clearance Clear  Hemodialysis Intake (mL) 500 mL  Liters Processed 20.7  Fluid Removed (mL) 0 mL  Tolerated HD Treatment Yes  Post-Hemodialysis Comments Tx terminated per NP. Patient is under his edw. Heart rate has been elevated despite 400cc saline bolus. Patient reports no symptoms. Access is positive for thrill and bruit. No prolonged bleeding. Hemostasis achieved.  AVG/AVF Arterial Site Held (minutes) 5 minutes  AVG/AVF Venous Site Held (minutes) 5 minutes  Fistula / Graft Left Upper arm Arteriovenous fistula  No placement date or time found.   Orientation: Left  Access Location: Upper arm  Access Type: Arteriovenous fistula  Site Condition No complications  Fistula / Graft Assessment Present;Thrill;Bruit  Status Deaccessed;Patent  Drainage Description None (Hemostasis achieved)    "

## 2024-04-14 NOTE — Plan of Care (Signed)

## 2024-04-14 NOTE — Progress Notes (Signed)
 " PROGRESS NOTE    Luis Miller.  FMW:[id number] DOB: [birth year] DOA: 04/11/2024 PCP: Lateef, Munsoor, MD  Chief Complaint  Patient presents with   [facility] Course:  Luis Miller. is a 79 year old male with ESRD on HD MWF, hypertension, hyperlipidemia, junctional bradycardia, stroke, gout, memory loss, bipolar depression, BPH, OSA, anemia, thrombocytopenia, Crohn's disease, ulcerative colitis who presents after being found down. Patient was meant to have dialysis on day of arrival but did not arrive.  They performed a wellness check and patient was found on the ground outside of his home.  He reports he had been there for about an hour with generalized weakness.  He does not believe he fell and endorses a slow descent to the ground.  Head CT on arrival was without acute intracranial abnormality.  Patient has multiple lab derangements on arrival consistent with being overdue for dialysis including potassium of 5.7, calcium  11, bicarb 22, creatinine 12.9.  CXR negative for infiltration.  CT C-spine shows patchy ground glass infiltrates in the right upper lobe.  Patient was admitted and nephrology was consulted.  Patient received dialysis for the first time on 1/22.  He had significant improvement in labs afterwards.  Subjective: No acute events overnight. On evaluation today patient has no acute complaints.  He has requested I call his son.  I did discuss his care directly with his son, Charlie on the phone.   Objective: Vitals:   04/14/24 1157 04/14/24 1200 04/14/24 1230 04/14/24 1306  BP: 126/87 126/87 126/68 137/83  Pulse: (!) 124  (!) 124 (!) 126  Resp: 19 19 17 18   Temp:      TempSrc:      SpO2: 100%  98% 100%  Weight:      Height:        Intake/Output Summary (Last 24 hours) at 04/14/2024 1309 Last data filed at 04/14/2024 0231 Gross per 24 hour  Intake --  Output 300 ml  Net -300 ml   Filed Weights   04/13/24 1215 04/14/24 0607 04/14/24 1150  Weight:  85.3 kg 85 kg 85.5 kg    Examination: General exam: Appears calm and comfortable Respiratory system: No work of breathing, symmetric chest wall expansion Cardiovascular system: S1 & S2 heard, RRR.  Gastrointestinal system: Abdomen is nondistended, soft and nontender.  Neuro: Alert and oriented to self, place, year, president  Assessment & Plan:  Principal Problem:   Uremia Active Problems:   Junctional bradycardia   Hyperkalemia   Hypercalcemia   Hypermagnesemia   Fall at home, initial encounter   Myocardial injury   HTN (hypertension)   Hyperlipidemia   ESRD on hemodialysis (HCC)   Thrombocytopenia   Anemia in ESRD (end-stage renal disease) (HCC)   Bipolar depression (HCC)   Obstructive sleep apnea   Memory loss   Overweight (BMI 25.0-29.9)   QT prolongation    Uremia ESRD on HD Electrolyte disturbances including hyperkalemia, hypercalcemia, hypomagnesemia - Last dialysis 1 week prior to arrival.  Patient appears to have been having some transportation concerns - Patient with uremia on arrival, potassium 5.7, magnesium  3.5, BUN 101 - Now status post dialysis x 3.  Mental status is improving to baseline. - Per nephrology venous site has been difficult to access but does appear to be functional.  Had recent fistulogram without intervention needed - Continue HD -Labs improved.  Continue monitor CMP  Prolonged QT interval, ruled out - QTc very prolonged on  EKG, however after further review by cardiology appears that this is misinterpreted due to possible A-flutter/AV block -Antipsychotics have been held - 1/22 EKG with Afib RVR 110s.  Received 2.5 mg IVP metoprolol .  Had brief improvement.  May require additional dose today. - Continue close monitoring on telemetry  Atrial flutter Atrial fibrillation with RVR - Cardiology has been consulted - Intermittently responsive to metoprolol  - Has history of junctional bradycardia - Currently on heparin  drip.  Appreciate  cardiology review - Echo: LVEF 60 to 65%, no RWMA, diastolic parameters indeterminate.  No significant valvular abnormalities noted - Continue monitor on telemetry  Metabolic encephalopathy - Presumed secondary to uremia. - Appears to be resolving today.  ANO x 3 now - Also medications have been held, will begin to gradually resume.  Patient's son reports he is very unstable without lithium  - 2 head CT is without acute intracranial abnormality - Continue to monitor closely - PT/OT - Frequent reorientation  Junctional bradycardia - Chronic.  Heart rate regularly in the 50s. - Patient has actually been elevated throughout the day.  See above - Avoid scheduled nodal blockers  Fall at home, initial encounter - Trauma scans negative for acute injury - PT/OT  Elevated troponin - Troponin 114 --> 122 no chest pain - Likely demand ischemia with decreased clearance due to ESRD - Continue statin - Echo as above  Hypertension - Resume home meds  Hyperlipidemia - Continue statin  Thrombocytopenia, chronic - Platelets stable - Trend CBC  Anemia in ESRD - Hemoglobin 9.1 - Follow CBC, no acute bleeding  Bipolar depression Supratherapeutic lithium  - Currently with prolonged QTc.  Hold all psych medications at this time - Patient's bipolar has been very difficult to manage.  Significant success with lithium .  Was supratherapeutic on arrival after months of being therapeutic.  Likely secondary to missed dialysis.  Lithium  was held.  Repeat lithium  level today.  Gradually restart as able  OSA - CPAP at night  Memory loss - On donepezil  at home  Body mass index is 30.42 kg/m. Obesity Class 1 - Outpatient follow up for lifestyle modification and risk factor management   DVT prophylaxis: Heparin    Code Status: Full Code Disposition:  Inpt pending clinical resolution  Consultants:  Treatment Team:  Consulting Physician: Raford Riggs, MD  Procedures:     Antimicrobials:  Anti-infectives (From admission, onward)    None       Data Reviewed: I have personally reviewed following labs and imaging studies CBC: Recent Labs  Lab 04/11/24 1800 04/12/24 0425 04/13/24 0812 04/14/24 0542  WBC 9.0 7.9 7.9 6.6  HGB 9.1* 9.8* 9.3* 10.0*  HCT 28.6* 31.0* 28.4* 29.5*  MCV 107.5* 108.4* 103.6* 101.7*  PLT 135* 156 178 174   Basic Metabolic Panel: Recent Labs  Lab 04/11/24 1800 04/11/24 2319 04/12/24 0425 04/12/24 1302 04/13/24 0812 04/14/24 0542 04/14/24 1009  NA 140  --  140 137 138 137  --   K 5.7* 5.8* 5.5* 3.7 4.0 3.7  --   CL 99  --  99 95* 96* 96*  --   CO2 22  --  21* 27 24 26   --   GLUCOSE 114*  --  96 100* 92 103*  --   BUN 101*  --  104* 52* 69* 48*  --   CREATININE 12.90*  --  13.40* 7.29* 9.27* 6.30*  --   CALCIUM  11.0*  --  10.8* 10.2 9.8 9.6  --   MG 3.5*  --   --  2.8*  --   --  2.6*  PHOS  --   --   --  4.8* 6.8*  --  5.3*   GFR: Estimated Creatinine Clearance: 9.7 mL/min (A) (by C-G formula based on SCr of 6.3 mg/dL (H)). Liver Function Tests: Recent Labs  Lab 04/11/24 1800 04/12/24 1302 04/13/24 0812  AST 22 25  --   ALT 8 7  --   ALKPHOS 70 75  --   BILITOT 0.6 1.0  --   PROT 6.7 7.0  --   ALBUMIN 4.1 3.9 3.5   CBG: No results for input(s): GLUCAP in the last 168 hours.  No results found for this or any previous visit (from the past 240 hours).   Radiology Studies: ECHOCARDIOGRAM COMPLETE Result Date: 04/14/2024    ECHOCARDIOGRAM REPORT   Patient Name:   Kris No. Date of Exam: 04/13/2024 Medical Rec #:  [id number]          Height:       66.0 in Accession #:    [id number]         Weight:       188.1 lb Date of Birth:  07/24/1945          BSA:          1.948 m Patient Age:    78 years           BP:           122/85 mmHg Patient Gender: M                  HR:           122 bpm. Exam Location:  [facility] Procedure: 2D Echo, Cardiac Doppler and Color Doppler (Both Spectral and Color             Flow Doppler were utilized during procedure). Indications:     Atrial Fibrillation I48.91  History:         Patient has prior history of Echocardiogram examinations, most                  recent 11/22/2022. Previous Myocardial Infarction, ESRD,                  Arrythmias:Bradycardia, Signs/Symptoms:Hypotension; Risk                  Factors:Hypertension, Sleep Apnea and Dyslipidemia.  Sonographer:     Thea Norlander RCS Referring Phys:  [id number] LORANE POLAND Diagnosing Phys: Annabella Scarce MD  Sonographer Comments: Suboptimal parasternal window. IMPRESSIONS  1. Left ventricular ejection fraction, by estimation, is 60 to 65%. The left ventricle has normal function. The left ventricle has no regional wall motion abnormalities. Left ventricular diastolic parameters are indeterminate.  2. Right ventricular systolic function is normal. The right ventricular size is normal. There is normal pulmonary artery systolic pressure.  3. Left atrial size was mildly dilated.  4. The mitral valve is normal in structure. No evidence of mitral valve regurgitation. No evidence of mitral stenosis.  5. The aortic valve is normal in structure. Aortic valve regurgitation is not visualized. No aortic stenosis is present.  6. The inferior vena cava is normal in size with greater than 50% respiratory variability, suggesting right atrial pressure of 3 mmHg. FINDINGS  Left Ventricle: Left ventricular ejection fraction, by estimation, is 60 to 65%. The left ventricle has normal function. The left ventricle has no regional wall motion abnormalities. The left ventricular internal cavity size  was normal in size. There is  no left ventricular hypertrophy. Left ventricular diastolic function could not be evaluated due to atrial fibrillation. Left ventricular diastolic parameters are indeterminate. Indeterminate filling pressures. Right Ventricle: The right ventricular size is normal. No increase in right ventricular wall thickness. Right  ventricular systolic function is normal. There is normal pulmonary artery systolic pressure. The tricuspid regurgitant velocity is 2.34 m/s, and  with an assumed right atrial pressure of 3 mmHg, the estimated right ventricular systolic pressure is 24.9 mmHg. Left Atrium: Left atrial size was mildly dilated. Right Atrium: Right atrial size was normal in size. Pericardium: There is no evidence of pericardial effusion. Mitral Valve: The mitral valve is normal in structure. No evidence of mitral valve regurgitation. No evidence of mitral valve stenosis. MV peak gradient, 10.9 mmHg. The mean mitral valve gradient is 4.0 mmHg. Tricuspid Valve: The tricuspid valve is normal in structure. Tricuspid valve regurgitation is not demonstrated. No evidence of tricuspid stenosis. Aortic Valve: The aortic valve is normal in structure. Aortic valve regurgitation is not visualized. No aortic stenosis is present. Aortic valve peak gradient measures 8.4 mmHg. Pulmonic Valve: The pulmonic valve was normal in structure. Pulmonic valve regurgitation is not visualized. No evidence of pulmonic stenosis. Aorta: The aortic root is normal in size and structure. Venous: The inferior vena cava is normal in size with greater than 50% respiratory variability, suggesting right atrial pressure of 3 mmHg. IAS/Shunts: No atrial level shunt detected by color flow Doppler.  LEFT VENTRICLE PLAX 2D LVOT diam:     1.90 cm   Diastology LV SV:         47        LV e' medial:    11.00 cm/s LV SV Index:   24        LV E/e' medial:  12.8 LVOT Area:     2.84 cm  LV e' lateral:   16.30 cm/s                          LV E/e' lateral: 8.7  RIGHT VENTRICLE             IVC RV S prime:     18.30 cm/s  IVC diam: 1.30 cm TAPSE (M-mode): 1.5 cm LEFT ATRIUM           Index        RIGHT ATRIUM           Index LA Vol (A2C): 57.0 ml 29.26 ml/m  RA Area:     16.50 cm LA Vol (A4C): 51.0 ml 26.18 ml/m  RA Volume:   46.50 ml  23.87 ml/m  AORTIC VALVE                 PULMONIC  VALVE AV Area (Vmax): 2.33 cm     PV Vmax:       1.52 m/s AV Vmax:        145.00 cm/s  PV Peak grad:  9.3 mmHg AV Peak Grad:   8.4 mmHg LVOT Vmax:      119.00 cm/s LVOT Vmean:     76.567 cm/s LVOT VTI:       0.165 m  AORTA Ao Root diam: 3.40 cm Ao Asc diam:  3.70 cm MITRAL VALVE                TRICUSPID VALVE MV Area (PHT): 5.97 cm     TR Peak grad:  21.9 mmHg MV Area VTI:   2.61 cm     TR Vmax:        234.00 cm/s MV Peak grad:  10.9 mmHg MV Mean grad:  4.0 mmHg     SHUNTS MV Vmax:       1.65 m/s     Systemic VTI:  0.16 m MV Vmean:      89.3 cm/s    Systemic Diam: 1.90 cm MV Decel Time: 127 msec MV E velocity: 141.00 cm/s MV A velocity: 81.60 cm/s MV E/A ratio:  1.73 Annabella Scarce MD Electronically signed by Annabella Scarce MD Signature Date/Time: 04/14/2024/9:30:51 AM    Final    CT HEAD WO CONTRAST ( ) Result Date: 04/12/2024 CLINICAL DATA:  Altered mental status. EXAM: CT HEAD WITHOUT CONTRAST TECHNIQUE: Contiguous axial images were obtained from the base of the skull through the vertex without intravenous contrast. RADIATION DOSE REDUCTION: This exam was performed according to the departmental dose-optimization program which includes automated exposure control, adjustment of the mA and/or kV according to patient size and/or use of iterative reconstruction technique. COMPARISON:  04/11/2024 and 11/21/2022 FINDINGS: Brain: Ventricles, cisterns and other CSF spaces are within normal. There is no mass, mass effect, shift of midline structures or acute hemorrhage. No acute infarction. Vascular: No hyperdense vessel or unexpected calcification. Skull: Normal. Negative for fracture or focal lesion. Sinuses/Orbits: No acute finding. Other: None. IMPRESSION: No acute findings. Electronically Signed   By: Toribio Agreste M.D.   On: 04/12/2024 15:28    Scheduled Meds:  Chlorhexidine  Gluconate Cloth  6 each Topical Q0600   gabapentin   100 mg Oral Daily   loratadine   10 mg Oral Daily   multivitamin with  minerals  1 tablet Oral Daily   patiromer   25.2 g Oral Daily   primidone   25 mg Oral QHS   rosuvastatin   10 mg Oral Daily   Continuous Infusions:  heparin  1,200 Units/hr (04/14/24 1041)      LOS: 2 days  MDM: Patient is high risk for one or more organ failure.  They necessitate ongoing hospitalization for continued IV therapies and subsequent lab monitoring. Total time spent interpreting labs and vitals, reviewing the medical record, coordinating care amongst consultants and care team members, directly assessing and discussing care with the patient and/or family: 55 min  Kameryn Davern, DO [facility]  To contact the attending physician between 7A-7P please use Epic Chat. To contact the covering physician during after hours 7P-7A, please review Amion.  04/14/2024, 1:09 PM   *This document has been created with the assistance of dictation software. Please excuse typographical errors. *   "
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
## 2024-04-14 NOTE — Consult Note (Signed)
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 malalignment of the cervical spine. 2. Patchy ground glass opacities in the right upper lobe concerning for infection. Electronically signed by: Donnice Mania MD 04/11/2024 05:47 PM EST RP Workstation: [id number]   CT HEAD WO CONTRAST Result Date: 04/11/2024 EXAM: CT HEAD WITHOUT CONTRAST 04/11/2024 05:19:06 PM TECHNIQUE: CT of the head was performed without the administration of intravenous contrast. Automated exposure control, iterative reconstruction, and/or weight based adjustment of the mA/kV was utilized to reduce the radiation dose to as low as reasonably achievable.  COMPARISON: 11/21/2022 CLINICAL HISTORY: fall fall fall fall fall FINDINGS: BRAIN AND VENTRICLES: No acute hemorrhage. No evidence of acute infarct. No hydrocephalus. No extra-axial collection. No mass effect or midline shift. Mild chronic microvascular ischemic changes. Mild parenchymal volume loss. Atherosclerosis of the carotid siphons. ORBITS: Bilateral lens replacement. SINUSES: No acute abnormality. SOFT TISSUES AND SKULL: No acute soft tissue  abnormality. No skull fracture. IMPRESSION: 1. No acute intracranial abnormality. Electronically signed by: Donnice Mania MD 04/11/2024 05:26 PM EST RP Workstation: [id number]   DG Chest Port 1 View Result Date: 04/11/2024 CLINICAL DATA:  Hypoxia.  Fall. EXAM: PORTABLE CHEST 1 VIEW COMPARISON:  11/21/2022 FINDINGS: Patient is rotated to the right. Lungs are hypoinflated without focal airspace consolidation or effusion. Cardiomediastinal silhouette is within normal. Multiple old right posterolateral rib fractures. Several old lateral left rib fractures. IMPRESSION: Hypoinflation without acute cardiopulmonary disease. Electronically Signed   By: Toribio Agreste M.D.   On: 04/11/2024 15:48   DG Hand Complete Right Result Date: 04/11/2024 CLINICAL DATA:  Fall with right hand pain. EXAM: RIGHT HAND - COMPLETE 3+ VIEW COMPARISON:  None Available. FINDINGS: Degenerative changes over the first carpometacarpal joint. Mild decreased bone mineralization. Bony alignment is normal. There is no evidence of acute fracture or dislocation. IMPRESSION: 1. No acute findings. 2. Degenerative changes over the first carpometacarpal joint. Electronically Signed   By: Toribio Agreste M.D.   On: 04/11/2024 15:46   DG Hip Unilat W or Wo Pelvis 2-3 Views Right Result Date: 04/11/2024 CLINICAL DATA:  Fall. EXAM: DG HIP (WITH OR WITHOUT PELVIS) 2-3V RIGHT COMPARISON:  None Available. FINDINGS: There is no evidence of acute fracture or dislocation. Mild degenerate changes of the spine and sacroiliac joints. Penile prosthesis. Remainder of the exam is unremarkable. IMPRESSION: No acute findings. Electronically Signed   By: Toribio Agreste M.D.   On: 04/11/2024 15:45     Assessment and Plan:  # Atrial flutter: He is relatively asymptomatic.  Denies any prior history of atrial fibrillation or flutter.  He was started on IV heparin .  Rates remain uncontrolled despite starting metoprolol .  Given his need for HD, will avoid lowering BP too  much with attempts at rate control.  Will start IV amiodarone .  If he does not convert, plan for TEE/DCCV prior to discharge.  Plan to transition to Eliquis  as long as there are no GI bleeding issues.  He denies any issues with this in years.   # Hyperlipidemia:  Continue rosuvastatin .  # Demand ischemia:  HS-troponin elevated on admission.  Pattern consistent with demand ischemia.  No ischemic work up at this time.   Risk Assessment/Risk Scores:         CHA2DS2-VASc Score = 6   This indicates a 9.7% annual risk of stroke. The patient's score is based upon: CHF History: 0 HTN History: 1 Diabetes History: 0 Stroke History: 2 Vascular Disease History: 1 Age Score: 2 Gender Score: 0        For questions or updates, please contact [facility] Please consult [email or web address] for contact info under      Signed, Annabella Scarce, MD  04/15/2024 11:02 AM     [1]  Allergies Allergen Reactions   Mirtazapine  Rash   Aspirin     Other reaction(s): Unknown Patient was instructed not to take aspirin but can not remember why   Atomoxetine Other (See Comments)    Urinary sx   Strattera [Atomoxetine Hcl] Other (See Comments)    Urinary sx  Xyzal  [Levocetirizine Dihydrochloride ] Rash    Patient reported on 11/02/17   "

## 2024-04-14 NOTE — Progress Notes (Signed)
 " [facility]  ROUNDING NOTE   Subjective:   Luis Miller. is a 79 year old male with end-stage renal disease on hemodialysis, hypertension, gout, depression, history of melanoma, Crohn's disease, history of stroke, bipolar disorder. 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 . Patient seen and evaluated during dialysis   Hemodialysis dialysis treatment flowsheet  Blood flow rate (mL/min): 299 Arterial pressures (mmHg): -151.91 Venous pressures (mmHg): 189.08 TMP (mmHg): 12.93 Ultrafiltration rate (mL/min): 267 Dialysate (mL/min): 300  Patient alert and oriented. Admits to feeling poorly and wants son updated on status.   Objective:  Vital signs in last 24 hours:  Temp:  [97.5 F (36.4 C)-99.1 F (37.3 C)] 98.8 F (37.1 C) (01/24 1150) Pulse Rate:  [98-125] 124 (01/24 1157) Resp:  [16-20] 19 (01/24 1157) BP: (114-137)/(78-96) 126/87 (01/24 1157) SpO2:  [89 %-100 %] 100 % (01/24 1157) Weight:  [85 kg-85.5 kg] 85.5 kg (01/24 1150)  Weight change: -3.3 kg Filed Weights   04/13/24 1215 04/14/24 0607 04/14/24 1150  Weight: 85.3 kg 85 kg 85.5 kg    Intake/Output: I/O last 3 completed shifts: In: -  Out: 700 [Urine:700]   Intake/Output this shift:  No intake/output data recorded.  Physical Exam: General: NAD, resting comfortably  Head: Normocephalic  Eyes: Anicteric  Lungs:  Clear to auscultation  Heart: Irregular  Abdomen:  Soft, nontender  Extremities:  Trace peripheral edema.  Neurologic: Awake, alert  Skin: Warm,dry, no rash  Access: Left AVF    Basic Metabolic Panel: Recent Labs  Lab  04/11/24 1800 04/11/24 2319 04/12/24 0425 04/12/24 1302 04/13/24 0812 04/14/24 0542 04/14/24 1009  NA 140  --  140 137 138 137  --   K 5.7* 5.8* 5.5* 3.7 4.0 3.7  --   CL 99  --  99 95* 96* 96*  --   CO2 22  --  21* 27 24 26   --   GLUCOSE 114*  --  96 100* 92 103*  --   BUN 101*  --  104* 52* 69* 48*  --   CREATININE 12.90*  --  13.40* 7.29* 9.27* 6.30*  --   CALCIUM  11.0*  --  10.8* 10.2 9.8 9.6  --   MG 3.5*  --   --  2.8*  --   --  2.6*  PHOS  --   --   --  4.8* 6.8*  --  5.3*    Liver Function Tests: Recent Labs  Lab 04/11/24 1800 04/12/24 1302 04/13/24 0812  AST 22 25  --   ALT 8 7  --   ALKPHOS 70 75  --   BILITOT 0.6 1.0  --   PROT 6.7 7.0  --   ALBUMIN 4.1 3.9 3.5   No results for input(s): LIPASE, AMYLASE in the last 168 hours. No results for input(s): AMMONIA in the last 168 hours.  CBC: Recent Labs  Lab 04/11/24 1800 04/12/24 0425 04/13/24 0812 04/14/24 0542  WBC 9.0 7.9 7.9 6.6  HGB 9.1* 9.8* 9.3* 10.0*  HCT 28.6* 31.0* 28.4* 29.5*  MCV 107.5* 108.4* 103.6* 101.7*  PLT 135* 156 178 174    Cardiac Enzymes: Recent Labs  Lab 04/11/24  2319  CKTOTAL 232    BNP: Invalid input(s): POCBNP  CBG: No results for input(s): GLUCAP in the last 168 hours.  Microbiology: Results for orders placed or performed in visit on 05/25/23  Microscopic Examination     Status: Abnormal   Collection Time: 05/25/23  4:05 PM   Urine  Result Value Ref Range Status   WBC, UA 11-30 (A) 0 - 5 /hpf Final   RBC, Urine 3-10 (A) 0 - 2 /hpf Final   Epithelial Cells (non renal) 0-10 0 - 10 /hpf Final   Casts Present (A) None seen /lpf Final   Cast Type Hyaline casts N/A Final   Mucus, UA Present (A) Not Estab. Final   Bacteria, UA Moderate (A) None seen/Few Final    Coagulation Studies: Recent Labs    04/14/24 1009  LABPROT 15.5*  INR 1.2    Urinalysis: Recent Labs    04/11/24 2317  COLORURINE YELLOW*  LABSPEC 1.014  PHURINE 5.0  GLUCOSEU  NEGATIVE  HGBUR LARGE*  BILIRUBINUR NEGATIVE  KETONESUR NEGATIVE  PROTEINUR 100*  NITRITE NEGATIVE  LEUKOCYTESUR TRACE*      Imaging: ECHOCARDIOGRAM COMPLETE Result Date: 04/14/2024    ECHOCARDIOGRAM REPORT   Patient Name:   [name]. Date of Exam: 04/13/2024 Medical Rec #:  [id number]          Height:       66.0 in Accession #:    [id number]         Weight:       188.1 lb Date of Birth:  [birth year]          BSA:          1.948 m Patient Age:    78 years           BP:           122/85 mmHg Patient Gender: M                  HR:           122 bpm. Exam Location:  [facility] Procedure: 2D Echo, Cardiac Doppler and Color Doppler (Both Spectral and Color            Flow Doppler were utilized during procedure). Indications:     Atrial Fibrillation I48.91  History:         Patient has prior history of Echocardiogram examinations, most                  recent 11/22/2022. Previous Myocardial Infarction, ESRD,                  Arrythmias:Bradycardia, Signs/Symptoms:Hypotension; Risk                  Factors:Hypertension, Sleep Apnea and Dyslipidemia.  Sonographer:     Thea Norlander RCS Referring Phys:  [id number] LORANE POLAND Diagnosing Phys: Annabella Scarce MD  Sonographer Comments: Suboptimal parasternal window. IMPRESSIONS  1. Left ventricular ejection fraction, by estimation, is 60 to 65%. The left ventricle has normal function. The left ventricle has no regional wall motion abnormalities. Left ventricular diastolic parameters are indeterminate.  2. Right ventricular systolic function is normal. The right ventricular size is normal. There is normal pulmonary artery systolic pressure.  3. Left atrial size was mildly dilated.  4. The mitral valve is normal in structure. No evidence of mitral valve regurgitation. No evidence of mitral stenosis.  5. The aortic valve is normal in structure. Aortic valve regurgitation  is not visualized. No aortic stenosis is present.  6. The inferior vena cava is normal in size  with greater than 50% respiratory variability, suggesting right atrial pressure of 3 mmHg. FINDINGS  Left Ventricle: Left ventricular ejection fraction, by estimation, is 60 to 65%. The left ventricle has normal function. The left ventricle has no regional wall motion abnormalities. The left ventricular internal cavity size was normal in size. There is  no left ventricular hypertrophy. Left ventricular diastolic function could not be evaluated due to atrial fibrillation. Left ventricular diastolic parameters are indeterminate. Indeterminate filling pressures. Right Ventricle: The right ventricular size is normal. No increase in right ventricular wall thickness. Right ventricular systolic function is normal. There is normal pulmonary artery systolic pressure. The tricuspid regurgitant velocity is 2.34 m/s, and  with an assumed right atrial pressure of 3 mmHg, the estimated right ventricular systolic pressure is 24.9 mmHg. Left Atrium: Left atrial size was mildly dilated. Right Atrium: Right atrial size was normal in size. Pericardium: There is no evidence of pericardial effusion. Mitral Valve: The mitral valve is normal in structure. No evidence of mitral valve regurgitation. No evidence of mitral valve stenosis. MV peak gradient, 10.9 mmHg. The mean mitral valve gradient is 4.0 mmHg. Tricuspid Valve: The tricuspid valve is normal in structure. Tricuspid valve regurgitation is not demonstrated. No evidence of tricuspid stenosis. Aortic Valve: The aortic valve is normal in structure. Aortic valve regurgitation is not visualized. No aortic stenosis is present. Aortic valve peak gradient measures 8.4 mmHg. Pulmonic Valve: The pulmonic valve was normal in structure. Pulmonic valve regurgitation is not visualized. No evidence of pulmonic stenosis. Aorta: The aortic root is normal in size and structure. Venous: The inferior vena cava is normal in size with greater than 50% respiratory variability, suggesting right atrial  pressure of 3 mmHg. IAS/Shunts: No atrial level shunt detected by color flow Doppler.  LEFT VENTRICLE PLAX 2D LVOT diam:     1.90 cm   Diastology LV SV:         47        LV e' medial:    11.00 cm/s LV SV Index:   24        LV E/e' medial:  12.8 LVOT Area:     2.84 cm  LV e' lateral:   16.30 cm/s                          LV E/e' lateral: 8.7  RIGHT VENTRICLE             IVC RV S prime:     18.30 cm/s  IVC diam: 1.30 cm TAPSE (M-mode): 1.5 cm LEFT ATRIUM           Index        RIGHT ATRIUM           Index LA Vol (A2C): 57.0 ml 29.26 ml/m  RA Area:     16.50 cm LA Vol (A4C): 51.0 ml 26.18 ml/m  RA Volume:   46.50 ml  23.87 ml/m  AORTIC VALVE                 PULMONIC VALVE AV Area (Vmax): 2.33 cm     PV Vmax:       1.52 m/s AV Vmax:        145.00 cm/s  PV Peak grad:  9.3 mmHg AV Peak Grad:   8.4 mmHg LVOT Vmax:  119.00 cm/s LVOT Vmean:     76.567 cm/s LVOT VTI:       0.165 m  AORTA Ao Root diam: 3.40 cm Ao Asc diam:  3.70 cm MITRAL VALVE                TRICUSPID VALVE MV Area (PHT): 5.97 cm     TR Peak grad:   21.9 mmHg MV Area VTI:   2.61 cm     TR Vmax:        234.00 cm/s MV Peak grad:  10.9 mmHg MV Mean grad:  4.0 mmHg     SHUNTS MV Vmax:       1.65 m/s     Systemic VTI:  0.16 m MV Vmean:      89.3 cm/s    Systemic Diam: 1.90 cm MV Decel Time: 127 msec MV E velocity: 141.00 cm/s MV A velocity: 81.60 cm/s MV E/A ratio:  1.73 Annabella Scarce MD Electronically signed by Annabella Scarce MD Signature Date/Time: 04/14/2024/9:30:51 AM    Final    CT HEAD WO CONTRAST ( ) Result Date: 04/12/2024 CLINICAL DATA:  Altered mental status. EXAM: CT HEAD WITHOUT CONTRAST TECHNIQUE: Contiguous axial images were obtained from the base of the skull through the vertex without intravenous contrast. RADIATION DOSE REDUCTION: This exam was performed according to the departmental dose-optimization program which includes automated exposure control, adjustment of the mA and/or kV according to patient size and/or use of  iterative reconstruction technique. COMPARISON:  04/11/2024 and 11/21/2022 FINDINGS: Brain: Ventricles, cisterns and other CSF spaces are within normal. There is no mass, mass effect, shift of midline structures or acute hemorrhage. No acute infarction. Vascular: No hyperdense vessel or unexpected calcification. Skull: Normal. Negative for fracture or focal lesion. Sinuses/Orbits: No acute finding. Other: None. IMPRESSION: No acute findings. Electronically Signed   By: Toribio Agreste M.D.   On: 04/12/2024 15:28     Medications:    heparin  1,200 Units/hr (04/14/24 1041)    Chlorhexidine  Gluconate Cloth  6 each Topical Q0600   gabapentin   100 mg Oral Daily   loratadine   10 mg Oral Daily   multivitamin with minerals  1 tablet Oral Daily   patiromer   25.2 g Oral Daily   primidone   25 mg Oral QHS   rosuvastatin   10 mg Oral Daily   acetaminophen , albuterol , dextromethorphan-guaiFENesin , hydrALAZINE , ondansetron  (ZOFRAN ) IV  Assessment/ Plan:  Mr. Savion Washam. is a 79 y.o.  male with end-stage renal disease on hemodialysis, hypertension, gout, depression, history of melanoma, Crohn's disease, history of stroke, bipolar disorder. 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 Last treatment received on 1/14 due to access. Additional dialysis today in anticipation for weather next week. Patient tachycardic and patient below EDW. 0UF removed and IVF bolus given.  2. Anemia of chronic kidney disease Lab Results  Component Value Date   HGB 10.0 (L) 04/14/2024    Hgb within optimal range. No need for ESA at this time.   3. Secondary Hyperparathyroidism: with outpatient labs: PTH 235, phosphorus 6.0, calcium   10.4 on 04/13/23.   Lab Results  Component Value Date   CALCIUM  9.6 04/14/2024   CAION 1.17 11/05/2021   PHOS 5.3 (H) 04/14/2024    Phos improving at  5.3  4. Fall, unwitnessed. Imaging negative for acute fracture. PT/OT.    LOS: 2 Tiki Tucciarone SHAUNNA Dines 1/24/202612:08 PM   "

## 2024-04-15 DIAGNOSIS — I4892 Unspecified atrial flutter: Secondary | ICD-10-CM | POA: Insufficient documentation

## 2024-04-15 DIAGNOSIS — N186 End stage renal disease: Secondary | ICD-10-CM | POA: Diagnosis not present

## 2024-04-15 DIAGNOSIS — N19 Unspecified kidney failure: Secondary | ICD-10-CM | POA: Diagnosis not present

## 2024-04-15 DIAGNOSIS — Z992 Dependence on renal dialysis: Secondary | ICD-10-CM | POA: Diagnosis not present

## 2024-04-15 DIAGNOSIS — I483 Typical atrial flutter: Secondary | ICD-10-CM

## 2024-04-15 DIAGNOSIS — R9431 Abnormal electrocardiogram [ECG] [EKG]: Secondary | ICD-10-CM | POA: Diagnosis not present

## 2024-04-15 DIAGNOSIS — E785 Hyperlipidemia, unspecified: Secondary | ICD-10-CM | POA: Diagnosis not present

## 2024-04-15 LAB — CBC
HCT: 28.9 % — ABNORMAL LOW (ref 39.0–52.0)
Hemoglobin: 9.6 g/dL — ABNORMAL LOW (ref 13.0–17.0)
MCH: 33.9 pg (ref 26.0–34.0)
MCHC: 33.2 g/dL (ref 30.0–36.0)
MCV: 102.1 fL — ABNORMAL HIGH (ref 80.0–100.0)
Platelets: 204 10*3/uL (ref 150–400)
RBC: 2.83 MIL/uL — ABNORMAL LOW (ref 4.22–5.81)
RDW: 14.2 % (ref 11.5–15.5)
WBC: 7.1 10*3/uL (ref 4.0–10.5)
nRBC: 0 % (ref 0.0–0.2)

## 2024-04-15 LAB — COMPREHENSIVE METABOLIC PANEL WITH GFR
ALT: 10 U/L (ref 0–44)
AST: 25 U/L (ref 15–41)
Albumin: 3.4 g/dL — ABNORMAL LOW (ref 3.5–5.0)
Alkaline Phosphatase: 65 U/L (ref 38–126)
Anion gap: 16 — ABNORMAL HIGH (ref 5–15)
BUN: 54 mg/dL — ABNORMAL HIGH (ref 8–23)
CO2: 25 mmol/L (ref 22–32)
Calcium: 9.9 mg/dL (ref 8.9–10.3)
Chloride: 97 mmol/L — ABNORMAL LOW (ref 98–111)
Creatinine, Ser: 6.49 mg/dL — ABNORMAL HIGH (ref 0.61–1.24)
GFR, Estimated: 8 mL/min — ABNORMAL LOW
Glucose, Bld: 102 mg/dL — ABNORMAL HIGH (ref 70–99)
Potassium: 3.7 mmol/L (ref 3.5–5.1)
Sodium: 137 mmol/L (ref 135–145)
Total Bilirubin: 0.5 mg/dL (ref 0.0–1.2)
Total Protein: 6.2 g/dL — ABNORMAL LOW (ref 6.5–8.1)

## 2024-04-15 LAB — MAGNESIUM: Magnesium: 2.5 mg/dL — ABNORMAL HIGH (ref 1.7–2.4)

## 2024-04-15 LAB — HEPARIN LEVEL (UNFRACTIONATED)
Heparin Unfractionated: 0.15 [IU]/mL — ABNORMAL LOW (ref 0.30–0.70)
Heparin Unfractionated: 0.38 [IU]/mL (ref 0.30–0.70)

## 2024-04-15 LAB — PHOSPHORUS: Phosphorus: 5.5 mg/dL — ABNORMAL HIGH (ref 2.5–4.6)

## 2024-04-15 LAB — BLOOD GAS, VENOUS
Acid-Base Excess: 1.7 mmol/L (ref 0.0–2.0)
Bicarbonate: 28.2 mmol/L — ABNORMAL HIGH (ref 20.0–28.0)
Patient temperature: 37
Patient temperature: 37
pCO2, Ven: 51 mmHg (ref 44–60)
pH, Ven: 7.35 (ref 7.25–7.43)
pO2, Ven: 53 mmHg — ABNORMAL HIGH (ref 32–45)

## 2024-04-15 MED ORDER — PATIROMER SORBITEX CALCIUM 8.4 G PO PACK
8.4000 g | PACK | Freq: Every day | ORAL | Status: DC
  Administered 2024-04-15: 8.4 g via ORAL
  Filled 2024-04-15 (×2): qty 1

## 2024-04-15 MED ORDER — HEPARIN BOLUS VIA INFUSION
2500.0000 [IU] | Freq: Once | INTRAVENOUS | Status: AC
  Administered 2024-04-15: 2500 [IU] via INTRAVENOUS
  Filled 2024-04-15: qty 2500
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
## 2024-04-15 NOTE — Plan of Care (Signed)

## 2024-04-15 NOTE — Progress Notes (Signed)
 " PROGRESS NOTE    Luis Miller.  FMW:[id number] DOB: January 18, 1946 DOA: 04/11/2024 PCP: Lateef, Munsoor, MD  Chief Complaint  Patient presents with   [facility] Course:  Luis Miller. is a 79 year old male with ESRD on HD MWF, hypertension, hyperlipidemia, junctional bradycardia, stroke, gout, memory loss, bipolar depression, BPH, OSA, anemia, thrombocytopenia, Crohn's disease, ulcerative colitis who presents after being found down. Patient was meant to have dialysis on day of arrival but did not arrive.  They performed a wellness check and patient was found on the ground outside of his home.  He reports he had been there for about an hour with generalized weakness.  He does not believe he fell and endorses a slow descent to the ground.  Head CT on arrival was without acute intracranial abnormality.  Patient has multiple lab derangements on arrival consistent with being overdue for dialysis including potassium of 5.7, calcium  11, bicarb 22, creatinine 12.9.  CXR negative for infiltration.  CT C-spine shows patchy ground glass infiltrates in the right upper lobe.  Patient was admitted and nephrology was consulted.  Patient received dialysis for the first time on 1/22.  He had significant improvement in labs afterwards.  Subjective: Still tachycardic this morning.  Patient denies any acute complaints.  Denies pain   Objective: Vitals:   04/14/24 2320 04/15/24 0419 04/15/24 0500 04/15/24 0727  BP: 111/80 133/67  120/81  Pulse: (!) 120 (!) 116  (!) 114  Resp: 20 20  18   Temp: 99.7 F (37.6 C) 98.4 F (36.9 C)  98.6 F (37 C)  TempSrc:    Oral  SpO2: 99% 96%  95%  Weight:   84 kg   Height:        Intake/Output Summary (Last 24 hours) at 04/15/2024 1137 Last data filed at 04/14/2024 2102 Gross per 24 hour  Intake 249.98 ml  Output 200 ml  Net 49.98 ml   Filed Weights   04/14/24 1150 04/14/24 1308 04/15/24 0500  Weight: 85.5 kg 85.9 kg 84 kg     Examination: General exam: Appears calm and comfortable Respiratory system: No work of breathing, symmetric chest wall expansion Cardiovascular system: S1 & S2 heard, RRR.  Gastrointestinal system: Abdomen is nondistended, soft and nontender.  Neuro: Alert and oriented to self, and place.  Disoriented to year today  Assessment & Plan:  Principal Problem:   Uremia Active Problems:   Junctional bradycardia   Hyperkalemia   Hypercalcemia   Hypermagnesemia   Fall at home, initial encounter   Myocardial injury   HTN (hypertension)   Hyperlipidemia   ESRD on hemodialysis (HCC)   Thrombocytopenia   Anemia in ESRD (end-stage renal disease) (HCC)   Bipolar depression (HCC)   Obstructive sleep apnea   Memory loss   Overweight (BMI 25.0-29.9)   QT prolongation   Typical atrial flutter (HCC)    Uremia ESRD on HD Electrolyte disturbances including hyperkalemia, hypercalcemia, hypomagnesemia - Last dialysis 1 week prior to arrival.  Patient appears to have been having some transportation concerns - Patient with uremia on arrival, potassium 5.7, magnesium  3.5, BUN 101 - Now status post dialysis x 3.  Mental status is improving to baseline. - Per nephrology venous site has been difficult to access but does appear to be functional.  Had recent fistulogram without intervention needed - Continue with HD - Continue to monitor CMP.  Labs improving  Prolonged QT interval, ruled out - QTc  very prolonged on EKG, however after further review by cardiology appears that this is misinterpreted due to possible A-flutter/AV block -Antipsychotics have been held - Continue telemetry monitoring  Atrial flutter Atrial fibrillation with RVR - Cardiology has been consulted - Intermittently responsive to metoprolol  - Metoprolol  started yesterday, and rate remains uncontrolled - Cardiology starting amiodarone  today, if this is not successful they are planning for TEE/DCCV prior to discharge -  Continue with heparin  drip for now with eventual transition to Eliquis  - Echo: LVEF 60 to 65%, no RWMA, diastolic parameters indeterminate.  No significant valvular abnormalities noted - Continue close monitoring on telemetry  Metabolic encephalopathy - Presumed secondary to uremia. - Has gradually resolved - Frequent reorientation, delirium precautions - Psychiatric medications held on arrival due to persistent somnolence.  Gradually resume - 2 head CT is without acute intracranial abnormality - PT/OT as able to participate  Junctional bradycardia - Chronic.  Heart rate regularly in the 50s. - Patient has actually been elevated throughout the day.  See above - Avoid scheduled nodal blockers  Fall at home, initial encounter - Trauma scans negative for acute injury - PT/OT  Elevated troponin - Troponin 114 --> 122 no chest pain - Likely demand ischemia with decreased clearance due to ESRD.  Cardiology aware. - Continue statin.  Hypertension - Resume home meds  Hyperlipidemia - Continue statin  Thrombocytopenia, chronic - Platelets stable - Trend CBC  Anemia in ESRD - Hemoglobin 9.1 - Follow CBC, no acute bleeding  Bipolar depression Supratherapeutic lithium  - Patient's bipolar has been very difficult to manage.  Significant success with lithium .  Was supratherapeutic on arrival after months of being therapeutic.  Likely secondary to missed dialysis.  Lithium  was held. - Repeat lithium  level on 1/24 is subtherapeutic.  Lithium  restarted.  Repeat lithium  levels every 48 hours r  OSA - CPAP at night  Memory loss - On donepezil  at home  Body mass index is 29.89 kg/m. Obesity Class 1 - Outpatient follow up for lifestyle modification and risk factor management   DVT prophylaxis: Heparin    Code Status: Full Code Disposition:  Inpt pending clinical resolution  Consultants:  Treatment Team:  Consulting Physician: Raford Riggs, MD Consulting Physician:  Perla Evalene PARAS, MD  Procedures:    Antimicrobials:  Anti-infectives (From admission, onward)    None       Data Reviewed: I have personally reviewed following labs and imaging studies CBC: Recent Labs  Lab 04/11/24 1800 04/12/24 0425 04/13/24 0812 04/14/24 0542 04/15/24 0628  WBC 9.0 7.9 7.9 6.6 7.1  HGB 9.1* 9.8* 9.3* 10.0* 9.6*  HCT 28.6* 31.0* 28.4* 29.5* 28.9*  MCV 107.5* 108.4* 103.6* 101.7* 102.1*  PLT 135* 156 178 174 204   Basic Metabolic Panel: Recent Labs  Lab 04/11/24 1800 04/11/24 2319 04/12/24 0425 04/12/24 1302 04/13/24 0812 04/14/24 0542 04/14/24 1009 04/15/24 0628  NA 140  --  140 137 138 137  --  137  K 5.7*   < > 5.5* 3.7 4.0 3.7  --  3.7  CL 99  --  99 95* 96* 96*  --  97*  CO2 22  --  21* 27 24 26   --  25  GLUCOSE 114*  --  96 100* 92 103*  --  102*  BUN 101*  --  104* 52* 69* 48*  --  54*  CREATININE 12.90*  --  13.40* 7.29* 9.27* 6.30*  --  6.49*  CALCIUM  11.0*  --  10.8* 10.2 9.8 9.6  --  9.9  MG 3.5*  --   --  2.8*  --   --  2.6* 2.5*  PHOS  --   --   --  4.8* 6.8*  --  5.3* 5.5*   < > = values in this interval not displayed.   GFR: Estimated Creatinine Clearance: 9.4 mL/min (A) (by C-G formula based on SCr of 6.49 mg/dL (H)). Liver Function Tests: Recent Labs  Lab 04/11/24 1800 04/12/24 1302 04/13/24 0812 04/15/24 0628  AST 22 25  --  25  ALT 8 7  --  10  ALKPHOS 70 75  --  65  BILITOT 0.6 1.0  --  0.5  PROT 6.7 7.0  --  6.2*  ALBUMIN 4.1 3.9 3.5 3.4*   CBG: No results for input(s): GLUCAP in the last 168 hours.  No results found for this or any previous visit (from the past 240 hours).   Radiology Studies: ECHOCARDIOGRAM COMPLETE Result Date: 04/14/2024    ECHOCARDIOGRAM REPORT   Patient Name:   Wasyl Dornfeld. Date of Exam: 04/13/2024 Medical Rec #:  [id number]          Height:       66.0 in Accession #:    [id number]         Weight:       188.1 lb Date of Birth:  1945-04-16          BSA:          1.948 m  Patient Age:    78 years           BP:           122/85 mmHg Patient Gender: M                  HR:           122 bpm. Exam Location:  [facility] Procedure: 2D Echo, Cardiac Doppler and Color Doppler (Both Spectral and Color            Flow Doppler were utilized during procedure). Indications:     Atrial Fibrillation I48.91  History:         Patient has prior history of Echocardiogram examinations, most                  recent 11/22/2022. Previous Myocardial Infarction, ESRD,                  Arrythmias:Bradycardia, Signs/Symptoms:Hypotension; Risk                  Factors:Hypertension, Sleep Apnea and Dyslipidemia.  Sonographer:     Thea Norlander RCS Referring Phys:  [id number] LORANE POLAND Diagnosing Phys: Annabella Scarce MD  Sonographer Comments: Suboptimal parasternal window. IMPRESSIONS  1. Left ventricular ejection fraction, by estimation, is 60 to 65%. The left ventricle has normal function. The left ventricle has no regional wall motion abnormalities. Left ventricular diastolic parameters are indeterminate.  2. Right ventricular systolic function is normal. The right ventricular size is normal. There is normal pulmonary artery systolic pressure.  3. Left atrial size was mildly dilated.  4. The mitral valve is normal in structure. No evidence of mitral valve regurgitation. No evidence of mitral stenosis.  5. The aortic valve is normal in structure. Aortic valve regurgitation is not visualized. No aortic stenosis is present.  6. The inferior vena cava is normal in size with greater than 50% respiratory variability, suggesting right atrial pressure of 3 mmHg. FINDINGS  Left Ventricle:  Left ventricular ejection fraction, by estimation, is 60 to 65%. The left ventricle has normal function. The left ventricle has no regional wall motion abnormalities. The left ventricular internal cavity size was normal in size. There is  no left ventricular hypertrophy. Left ventricular diastolic function could not be evaluated due  to atrial fibrillation. Left ventricular diastolic parameters are indeterminate. Indeterminate filling pressures. Right Ventricle: The right ventricular size is normal. No increase in right ventricular wall thickness. Right ventricular systolic function is normal. There is normal pulmonary artery systolic pressure. The tricuspid regurgitant velocity is 2.34 m/s, and  with an assumed right atrial pressure of 3 mmHg, the estimated right ventricular systolic pressure is 24.9 mmHg. Left Atrium: Left atrial size was mildly dilated. Right Atrium: Right atrial size was normal in size. Pericardium: There is no evidence of pericardial effusion. Mitral Valve: The mitral valve is normal in structure. No evidence of mitral valve regurgitation. No evidence of mitral valve stenosis. MV peak gradient, 10.9 mmHg. The mean mitral valve gradient is 4.0 mmHg. Tricuspid Valve: The tricuspid valve is normal in structure. Tricuspid valve regurgitation is not demonstrated. No evidence of tricuspid stenosis. Aortic Valve: The aortic valve is normal in structure. Aortic valve regurgitation is not visualized. No aortic stenosis is present. Aortic valve peak gradient measures 8.4 mmHg. Pulmonic Valve: The pulmonic valve was normal in structure. Pulmonic valve regurgitation is not visualized. No evidence of pulmonic stenosis. Aorta: The aortic root is normal in size and structure. Venous: The inferior vena cava is normal in size with greater than 50% respiratory variability, suggesting right atrial pressure of 3 mmHg. IAS/Shunts: No atrial level shunt detected by color flow Doppler.  LEFT VENTRICLE PLAX 2D LVOT diam:     1.90 cm   Diastology LV SV:         47        LV e' medial:    11.00 cm/s LV SV Index:   24        LV E/e' medial:  12.8 LVOT Area:     2.84 cm  LV e' lateral:   16.30 cm/s                          LV E/e' lateral: 8.7  RIGHT VENTRICLE             IVC RV S prime:     18.30 cm/s  IVC diam: 1.30 cm TAPSE (M-mode): 1.5 cm LEFT  ATRIUM           Index        RIGHT ATRIUM           Index LA Vol (A2C): 57.0 ml 29.26 ml/m  RA Area:     16.50 cm LA Vol (A4C): 51.0 ml 26.18 ml/m  RA Volume:   46.50 ml  23.87 ml/m  AORTIC VALVE                 PULMONIC VALVE AV Area (Vmax): 2.33 cm     PV Vmax:       1.52 m/s AV Vmax:        145.00 cm/s  PV Peak grad:  9.3 mmHg AV Peak Grad:   8.4 mmHg LVOT Vmax:      119.00 cm/s LVOT Vmean:     76.567 cm/s LVOT VTI:       0.165 m  AORTA Ao Root diam: 3.40 cm Ao Asc diam:  3.70 cm MITRAL VALVE  TRICUSPID VALVE MV Area (PHT): 5.97 cm     TR Peak grad:   21.9 mmHg MV Area VTI:   2.61 cm     TR Vmax:        234.00 cm/s MV Peak grad:  10.9 mmHg MV Mean grad:  4.0 mmHg     SHUNTS MV Vmax:       1.65 m/s     Systemic VTI:  0.16 m MV Vmean:      89.3 cm/s    Systemic Diam: 1.90 cm MV Decel Time: 127 msec MV E velocity: 141.00 cm/s MV A velocity: 81.60 cm/s MV E/A ratio:  1.73 Annabella Scarce MD Electronically signed by Annabella Scarce MD Signature Date/Time: 04/14/2024/9:30:51 AM    Final     Scheduled Meds:  Chlorhexidine  Gluconate Cloth  6 each Topical Q0600   docusate sodium   100 mg Oral BID   gabapentin   100 mg Oral Daily   lithium  carbonate  300 mg Oral Q supper   loratadine   10 mg Oral Daily   metoprolol  tartrate  25 mg Oral Q8H   multivitamin with minerals  1 tablet Oral Daily   patiromer   8.4 g Oral Daily   primidone   25 mg Oral QHS   rosuvastatin   10 mg Oral Daily   Continuous Infusions:  heparin  1,700 Units/hr (04/15/24 0740)      LOS: 3 days  MDM: Patient is high risk for one or more organ failure.  They necessitate ongoing hospitalization for continued IV therapies and subsequent lab monitoring. Total time spent interpreting labs and vitals, reviewing the medical record, coordinating care amongst consultants and care team members, directly assessing and discussing care with the patient and/or family: 55 min  Jazae Gandolfi, DO [facility]  To contact the  attending physician between 7A-7P please use Epic Chat. To contact the covering physician during after hours 7P-7A, please review Amion.  04/15/2024, 11:37 AM   *This document has been created with the assistance of dictation software. Please excuse typographical errors. *   "

## 2024-04-15 NOTE — Progress Notes (Signed)
 " [facility]  ROUNDING NOTE   Subjective:   Luis Miller. is a 79 year old male with end-stage renal disease on hemodialysis, hypertension, gout, depression, history of melanoma, Crohn's disease, history of stroke, bipolar disorder. 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 SABRA Update:  Patient seen and evaluated at bedside. Patient alert and awake. Admits to not eating as he should.   Objective:  Vital signs in last 24 hours:  Temp:  [98.4 F (36.9 C)-99.7 F (37.6 C)] 98.6 F (37 C) (01/25 0727) Pulse Rate:  [114-126] 120 (01/25 1140) Resp:  [17-20] 18 (01/25 0727) BP: (102-137)/(67-95) 133/84 (01/25 1140) SpO2:  [95 %-100 %] 95 % (01/25 0727) FiO2 (%):  [28 %] 28 % (01/24 2055) Weight:  [84 kg-85.9 kg] 84 kg (01/25 0500)  Weight change: 0.2 kg Filed Weights   04/14/24 1150 04/14/24 1308 04/15/24 0500  Weight: 85.5 kg 85.9 kg 84 kg    Intake/Output: I/O last 3 completed shifts: In: 250 [P.O.:240; I.V.:10] Out: 500 [Urine:500]   Intake/Output this shift:  No intake/output data recorded.  Physical Exam: General: NAD, resting comfortably  Head: Normocephalic  Eyes: Anicteric  Lungs:  Clear to auscultation, O2 [place]  Heart: Irregular  Abdomen:  Soft, nontender  Extremities:  No peripheral edema.  Neurologic: Awake, alert  Skin: Warm,dry, no rash  Access: Left AVF    Basic Metabolic Panel: Recent Labs  Lab 04/11/24 1800 04/11/24 2319 04/12/24 0425 04/12/24 1302 04/13/24 0812 04/14/24 0542 04/14/24 1009 04/15/24 0628  NA 140  --  140 137 138 137  --  137  K 5.7*   < > 5.5* 3.7 4.0 3.7  --  3.7   CL 99  --  99 95* 96* 96*  --  97*  CO2 22  --  21* 27 24 26   --  25  GLUCOSE 114*  --  96 100* 92 103*  --  102*  BUN 101*  --  104* 52* 69* 48*  --  54*  CREATININE 12.90*  --  13.40* 7.29* 9.27* 6.30*  --  6.49*  CALCIUM  11.0*  --  10.8* 10.2 9.8 9.6  --  9.9  MG 3.5*  --   --  2.8*  --   --  2.6* 2.5*  PHOS  --   --   --  4.8* 6.8*  --  5.3* 5.5*   < > = values in this interval not displayed.    Liver Function Tests: Recent Labs  Lab 04/11/24 1800 04/12/24 1302 04/13/24 0812 04/15/24 0628  AST 22 25  --  25  ALT 8 7  --  10  ALKPHOS 70 75  --  65  BILITOT 0.6 1.0  --  0.5  PROT 6.7 7.0  --  6.2*  ALBUMIN 4.1 3.9 3.5 3.4*   No results for input(s): LIPASE, AMYLASE in the last 168 hours. No results for input(s): AMMONIA in the last 168 hours.  CBC: Recent Labs  Lab 04/11/24 1800 04/12/24 0425 04/13/24 0812 04/14/24 0542 04/15/24 0628  WBC 9.0 7.9 7.9 6.6 7.1  HGB 9.1* 9.8* 9.3* 10.0* 9.6*  HCT 28.6* 31.0* 28.4* 29.5* 28.9*  MCV 107.5*  108.4* 103.6* 101.7* 102.1*  PLT 135* 156 178 174 204    Cardiac Enzymes: Recent Labs  Lab 04/11/24 2319  CKTOTAL 232    BNP: Invalid input(s): POCBNP  CBG: No results for input(s): GLUCAP in the last 168 hours.  Microbiology: Results for orders placed or performed in visit on 05/25/23  Microscopic Examination     Status: Abnormal   Collection Time: 05/25/23  4:05 PM   Urine  Result Value Ref Range Status   WBC, UA 11-30 (A) 0 - 5 /hpf Final   RBC, Urine 3-10 (A) 0 - 2 /hpf Final   Epithelial Cells (non renal) 0-10 0 - 10 /hpf Final   Casts Present (A) None seen /lpf Final   Cast Type Hyaline casts N/A Final   Mucus, UA Present (A) Not Estab. Final   Bacteria, UA Moderate (A) None seen/Few Final    Coagulation Studies: Recent Labs    04/14/24 1009  LABPROT 15.5*  INR 1.2    Urinalysis: No results for input(s): COLORURINE, LABSPEC, PHURINE, GLUCOSEU, HGBUR, BILIRUBINUR, KETONESUR,  PROTEINUR, UROBILINOGEN, NITRITE, LEUKOCYTESUR in the last 72 hours.  Invalid input(s): APPERANCEUR     Imaging: ECHOCARDIOGRAM COMPLETE Result Date: 04/14/2024    ECHOCARDIOGRAM REPORT   Patient Name:   Luis Miller. Date of Exam: 04/13/2024 Medical Rec #:  [id number]          Height:       66.0 in Accession #:    [id number]         Weight:       188.1 lb Date of Birth:  1945-07-03          BSA:          1.948 m Patient Age:    78 years           BP:           122/85 mmHg Patient Gender: M                  HR:           122 bpm. Exam Location:  [facility] Procedure: 2D Echo, Cardiac Doppler and Color Doppler (Both Spectral and Color            Flow Doppler were utilized during procedure). Indications:     Atrial Fibrillation I48.91  History:         Patient has prior history of Echocardiogram examinations, most                  recent 11/22/2022. Previous Myocardial Infarction, ESRD,                  Arrythmias:Bradycardia, Signs/Symptoms:Hypotension; Risk                  Factors:Hypertension, Sleep Apnea and Dyslipidemia.  Sonographer:     Thea Norlander RCS Referring Phys:  [id number] LORANE POLAND Diagnosing Phys: Annabella Scarce MD  Sonographer Comments: Suboptimal parasternal window. IMPRESSIONS  1. Left ventricular ejection fraction, by estimation, is 60 to 65%. The left ventricle has normal function. The left ventricle has no regional wall motion abnormalities. Left ventricular diastolic parameters are indeterminate.  2. Right ventricular systolic function is normal. The right ventricular size is normal. There is normal pulmonary artery systolic pressure.  3. Left atrial size was mildly dilated.  4. The mitral valve is normal in structure. No evidence of mitral valve regurgitation. No evidence of mitral stenosis.  5. The aortic valve  is normal in structure. Aortic valve regurgitation is not visualized. No aortic stenosis is present.  6. The inferior vena cava is normal in size with greater  than 50% respiratory variability, suggesting right atrial pressure of 3 mmHg. FINDINGS  Left Ventricle: Left ventricular ejection fraction, by estimation, is 60 to 65%. The left ventricle has normal function. The left ventricle has no regional wall motion abnormalities. The left ventricular internal cavity size was normal in size. There is  no left ventricular hypertrophy. Left ventricular diastolic function could not be evaluated due to atrial fibrillation. Left ventricular diastolic parameters are indeterminate. Indeterminate filling pressures. Right Ventricle: The right ventricular size is normal. No increase in right ventricular wall thickness. Right ventricular systolic function is normal. There is normal pulmonary artery systolic pressure. The tricuspid regurgitant velocity is 2.34 m/s, and  with an assumed right atrial pressure of 3 mmHg, the estimated right ventricular systolic pressure is 24.9 mmHg. Left Atrium: Left atrial size was mildly dilated. Right Atrium: Right atrial size was normal in size. Pericardium: There is no evidence of pericardial effusion. Mitral Valve: The mitral valve is normal in structure. No evidence of mitral valve regurgitation. No evidence of mitral valve stenosis. MV peak gradient, 10.9 mmHg. The mean mitral valve gradient is 4.0 mmHg. Tricuspid Valve: The tricuspid valve is normal in structure. Tricuspid valve regurgitation is not demonstrated. No evidence of tricuspid stenosis. Aortic Valve: The aortic valve is normal in structure. Aortic valve regurgitation is not visualized. No aortic stenosis is present. Aortic valve peak gradient measures 8.4 mmHg. Pulmonic Valve: The pulmonic valve was normal in structure. Pulmonic valve regurgitation is not visualized. No evidence of pulmonic stenosis. Aorta: The aortic root is normal in size and structure. Venous: The inferior vena cava is normal in size with greater than 50% respiratory variability, suggesting right atrial pressure of 3  mmHg. IAS/Shunts: No atrial level shunt detected by color flow Doppler.  LEFT VENTRICLE PLAX 2D LVOT diam:     1.90 cm   Diastology LV SV:         47        LV e' medial:    11.00 cm/s LV SV Index:   24        LV E/e' medial:  12.8 LVOT Area:     2.84 cm  LV e' lateral:   16.30 cm/s                          LV E/e' lateral: 8.7  RIGHT VENTRICLE             IVC RV S prime:     18.30 cm/s  IVC diam: 1.30 cm TAPSE (M-mode): 1.5 cm LEFT ATRIUM           Index        RIGHT ATRIUM           Index LA Vol (A2C): 57.0 ml 29.26 ml/m  RA Area:     16.50 cm LA Vol (A4C): 51.0 ml 26.18 ml/m  RA Volume:   46.50 ml  23.87 ml/m  AORTIC VALVE                 PULMONIC VALVE AV Area (Vmax): 2.33 cm     PV Vmax:       1.52 m/s AV Vmax:        145.00 cm/s  PV Peak grad:  9.3 mmHg AV Peak Grad:   8.4 mmHg  LVOT Vmax:      119.00 cm/s LVOT Vmean:     76.567 cm/s LVOT VTI:       0.165 m  AORTA Ao Root diam: 3.40 cm Ao Asc diam:  3.70 cm MITRAL VALVE                TRICUSPID VALVE MV Area (PHT): 5.97 cm     TR Peak grad:   21.9 mmHg MV Area VTI:   2.61 cm     TR Vmax:        234.00 cm/s MV Peak grad:  10.9 mmHg MV Mean grad:  4.0 mmHg     SHUNTS MV Vmax:       1.65 m/s     Systemic VTI:  0.16 m MV Vmean:      89.3 cm/s    Systemic Diam: 1.90 cm MV Decel Time: 127 msec MV E velocity: 141.00 cm/s MV A velocity: 81.60 cm/s MV E/A ratio:  1.73 Annabella Scarce MD Electronically signed by Annabella Scarce MD Signature Date/Time: 04/14/2024/9:30:51 AM    Final      Medications:    heparin  1,700 Units/hr (04/15/24 0740)    Chlorhexidine  Gluconate Cloth  6 each Topical Q0600   docusate sodium   100 mg Oral BID   gabapentin   100 mg Oral Daily   lithium  carbonate  300 mg Oral Q supper   loratadine   10 mg Oral Daily   metoprolol  tartrate  25 mg Oral Q8H   multivitamin with minerals  1 tablet Oral Daily   patiromer   8.4 g Oral Daily   primidone   25 mg Oral QHS   rosuvastatin   10 mg Oral Daily   acetaminophen , albuterol ,  dextromethorphan-guaiFENesin , hydrALAZINE , ondansetron  (ZOFRAN ) IV  Assessment/ Plan:  Luis Miller. is a 79 y.o.  male with end-stage renal disease on hemodialysis, hypertension, gout, depression, history of melanoma, Crohn's disease, history of stroke, bipolar disorder. 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   Tachycardic yesterday during dialysis with 0LUF. Will evaluate need for dialysis tomorrow.  2. Anemia of chronic kidney disease Lab Results  Component Value Date   HGB 9.6 (L) 04/15/2024    Hgb 9.5 No need for ESA at this time.   3. Secondary Hyperparathyroidism: with outpatient labs: PTH 235, phosphorus 6.0, calcium  10.4 on 04/13/23.   Lab Results  Component Value Date   CALCIUM  9.9 04/15/2024   CAION 1.17 11/05/2021   PHOS 5.5 (H) 04/15/2024    Phos 5.5, within normal range  4. Fall, unwitnessed. Imaging negative for acute fracture. PT/OT.    LOS: 3 Luis Miller Luis Miller 1/25/202612:58 PM   "

## 2024-04-15 NOTE — Care Management Important Message (Signed)
 Important Message  Patient Details  Name: Luis Miller. MRN: [id number] Date of Birth: December 01, 1945   Important Message Given:  Yes - Medicare IM     Faelyn Sigler W, CMA 04/15/2024, 12:19 PM
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
## 2024-04-16 ENCOUNTER — Ambulatory Visit: Admitting: Psychiatry

## 2024-04-16 DIAGNOSIS — N186 End stage renal disease: Secondary | ICD-10-CM | POA: Diagnosis not present

## 2024-04-16 DIAGNOSIS — I483 Typical atrial flutter: Secondary | ICD-10-CM

## 2024-04-16 DIAGNOSIS — R9431 Abnormal electrocardiogram [ECG] [EKG]: Secondary | ICD-10-CM | POA: Diagnosis not present

## 2024-04-16 DIAGNOSIS — R7989 Other specified abnormal findings of blood chemistry: Secondary | ICD-10-CM

## 2024-04-16 DIAGNOSIS — N19 Unspecified kidney failure: Secondary | ICD-10-CM | POA: Diagnosis not present

## 2024-04-16 LAB — BLOOD GAS, VENOUS
Acid-Base Excess: 0.1 mmol/L (ref 0.0–2.0)
Bicarbonate: 26 mmol/L (ref 20.0–28.0)
O2 Saturation: 88 %
Patient temperature: 37
pCO2, Ven: 46 mmHg (ref 44–60)
pH, Ven: 7.36 (ref 7.25–7.43)
pO2, Ven: 57 mmHg — ABNORMAL HIGH (ref 32–45)

## 2024-04-16 LAB — CBC
HCT: 28.5 % — ABNORMAL LOW (ref 39.0–52.0)
Hemoglobin: 9.4 g/dL — ABNORMAL LOW (ref 13.0–17.0)
MCH: 33.3 pg (ref 26.0–34.0)
MCHC: 33 g/dL (ref 30.0–36.0)
MCV: 101.1 fL — ABNORMAL HIGH (ref 80.0–100.0)
Platelets: 221 10*3/uL (ref 150–400)
RBC: 2.82 MIL/uL — ABNORMAL LOW (ref 4.22–5.81)
RDW: 14.5 % (ref 11.5–15.5)
WBC: 7.1 10*3/uL (ref 4.0–10.5)
nRBC: 0 % (ref 0.0–0.2)

## 2024-04-16 LAB — COMPREHENSIVE METABOLIC PANEL WITH GFR
ALT: 12 U/L (ref 0–44)
AST: 23 U/L (ref 15–41)
Albumin: 3.5 g/dL (ref 3.5–5.0)
Alkaline Phosphatase: 64 U/L (ref 38–126)
Anion gap: 15 (ref 5–15)
BUN: 68 mg/dL — ABNORMAL HIGH (ref 8–23)
CO2: 25 mmol/L (ref 22–32)
Calcium: 10.1 mg/dL (ref 8.9–10.3)
Chloride: 97 mmol/L — ABNORMAL LOW (ref 98–111)
Creatinine, Ser: 7.75 mg/dL — ABNORMAL HIGH (ref 0.61–1.24)
GFR, Estimated: 7 mL/min — ABNORMAL LOW
Glucose, Bld: 93 mg/dL (ref 70–99)
Potassium: 3.7 mmol/L (ref 3.5–5.1)
Sodium: 137 mmol/L (ref 135–145)
Total Bilirubin: 0.4 mg/dL (ref 0.0–1.2)
Total Protein: 6.4 g/dL — ABNORMAL LOW (ref 6.5–8.1)

## 2024-04-16 LAB — HEPARIN LEVEL (UNFRACTIONATED): Heparin Unfractionated: 0.33 [IU]/mL (ref 0.30–0.70)

## 2024-04-16 LAB — MAGNESIUM: Magnesium: 2.6 mg/dL — ABNORMAL HIGH (ref 1.7–2.4)

## 2024-04-16 LAB — HEPATITIS B SURFACE ANTIGEN: Hepatitis B Surface Ag: NONREACTIVE

## 2024-04-16 LAB — PHOSPHORUS: Phosphorus: 6.3 mg/dL — ABNORMAL HIGH (ref 2.5–4.6)

## 2024-04-16 MED ORDER — AMIODARONE HCL IN DEXTROSE 360-4.14 MG/200ML-% IV SOLN
30.0000 mg/h | INTRAVENOUS | Status: DC
  Administered 2024-04-16 – 2024-04-17 (×4): 30 mg/h via INTRAVENOUS
  Filled 2024-04-16 (×6): qty 200

## 2024-04-16 MED ORDER — AMIODARONE HCL IN DEXTROSE 360-4.14 MG/200ML-% IV SOLN
60.0000 mg/h | INTRAVENOUS | Status: DC
  Filled 2024-04-16: qty 200

## 2024-04-16 MED ORDER — AMIODARONE LOAD VIA INFUSION
150.0000 mg | Freq: Once | INTRAVENOUS | Status: AC
  Administered 2024-04-16: 150 mg via INTRAVENOUS
  Filled 2024-04-16: qty 83.34

## 2024-04-16 MED ORDER — AMIODARONE HCL IN DEXTROSE 360-4.14 MG/200ML-% IV SOLN
60.0000 mg/h | INTRAVENOUS | Status: DC
  Administered 2024-04-16 (×2): 60 mg/h via INTRAVENOUS
  Filled 2024-04-16 (×2): qty 200

## 2024-04-16 MED ORDER — AMIODARONE LOAD VIA INFUSION
150.0000 mg | Freq: Once | INTRAVENOUS | Status: DC
  Filled 2024-04-16: qty 83.34

## 2024-04-16 MED ORDER — AMIODARONE HCL IN DEXTROSE 360-4.14 MG/200ML-% IV SOLN
30.0000 mg/h | INTRAVENOUS | Status: DC
  Filled 2024-04-16: qty 200

## 2024-04-16 NOTE — Plan of Care (Signed)

## 2024-04-16 NOTE — Progress Notes (Signed)
 PT Cancellation Note  Patient Details Name: Luis Miller. MRN: [id number] DOB: 10-09-1945   Cancelled Treatment:    Reason Eval/Treat Not Completed: Medical issues which prohibited therapy (Chart reviewed for planned treatment session.  Per primary RN, patient not medically ready/not able to tolerate therapy today. Advises hold. Will continue to follow and re-attempt next date as medically appropriate.)   Cristie Mckinney H. Delores, PT, DPT, NCS 04/16/24, 10:51 AM [phone]

## 2024-04-16 NOTE — Progress Notes (Signed)
 " [facility]  ROUNDING NOTE   Subjective:   Luis Miller. is a 79 year old male with end-stage renal disease on hemodialysis, hypertension, gout, depression, history of melanoma, Crohn's disease, history of stroke, bipolar disorder. 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 SABRA Update:  Patient resting quietly in bed Flat affect Denies shortness of breath, 3L Trace lower extremity edema  Objective:  Vital signs in last 24 hours:  Temp:  [97.7 F (36.5 C)-98.8 F (37.1 C)] 97.7 F (36.5 C) (01/26 0736) Pulse Rate:  [117-121] 121 (01/26 0736) Resp:  [15-22] 16 (01/26 0917) BP: (114-157)/(75-88) 114/88 (01/26 0917) SpO2:  [98 %-100 %] 100 % (01/26 0917) Weight:  [85 kg] 85 kg (01/26 0500)  Weight change: -0.5 kg Filed Weights   04/14/24 1308 04/15/24 0500 04/16/24 0500  Weight: 85.9 kg 84 kg 85 kg    Intake/Output: I/O last 3 completed shifts: In: -  Out: 600 [Urine:600]   Intake/Output this shift:  No intake/output data recorded.  Physical Exam: General: NAD, resting comfortably  Head: Normocephalic  Eyes: Anicteric  Lungs:  Clear to auscultation, O2 [place]  Heart: Irregular  Abdomen:  Soft, nontender  Extremities:  Trace peripheral edema.  Neurologic: Awake, alert  Skin: Warm,dry, no rash  Access: Left AVF    Basic Metabolic Panel: Recent Labs  Lab 04/11/24 1800 04/11/24 2319 04/12/24 1302 04/13/24 0812 04/14/24 0542 04/14/24 1009 04/15/24 0628 04/16/24 0003  NA 140   < > 137 138 137  --  137 137  K 5.7*   < > 3.7 4.0 3.7  --  3.7 3.7  CL 99   < > 95* 96* 96*  --  97* 97*  CO2 22   < > 27  24 26   --  25 25  GLUCOSE 114*   < > 100* 92 103*  --  102* 93  BUN 101*   < > 52* 69* 48*  --  54* 68*  CREATININE 12.90*   < > 7.29* 9.27* 6.30*  --  6.49* 7.75*  CALCIUM  11.0*   < > 10.2 9.8 9.6  --  9.9 10.1  MG 3.5*  --  2.8*  --   --  2.6* 2.5* 2.6*  PHOS  --   --  4.8* 6.8*  --  5.3* 5.5* 6.3*   < > = values in this interval not displayed.    Liver Function Tests: Recent Labs  Lab 04/11/24 1800 04/12/24 1302 04/13/24 0812 04/15/24 0628 04/16/24 0003  AST 22 25  --  25 23  ALT 8 7  --  10 12  ALKPHOS 70 75  --  65 64  BILITOT 0.6 1.0  --  0.5 0.4  PROT 6.7 7.0  --  6.2* 6.4*  ALBUMIN 4.1 3.9 3.5 3.4* 3.5   No results for input(s): LIPASE, AMYLASE in the last 168 hours. No results for input(s): AMMONIA in the last 168 hours.  CBC: Recent Labs  Lab 04/12/24 0425 04/13/24 0812 04/14/24 0542 04/15/24 0628 04/16/24 0003  WBC 7.9 7.9 6.6 7.1 7.1  HGB 9.8* 9.3* 10.0* 9.6* 9.4*  HCT 31.0* 28.4* 29.5* 28.9* 28.5*  MCV 108.4* 103.6* 101.7*  102.1* 101.1*  PLT 156 178 174 204 221    Cardiac Enzymes: Recent Labs  Lab 04/11/24 2319  CKTOTAL 232    BNP: Invalid input(s): POCBNP  CBG: No results for input(s): GLUCAP in the last 168 hours.  Microbiology: Results for orders placed or performed in visit on 05/25/23  Microscopic Examination     Status: Abnormal   Collection Time: 05/25/23  4:05 PM   Urine  Result Value Ref Range Status   WBC, UA 11-30 (A) 0 - 5 /hpf Final   RBC, Urine 3-10 (A) 0 - 2 /hpf Final   Epithelial Cells (non renal) 0-10 0 - 10 /hpf Final   Casts Present (A) None seen /lpf Final   Cast Type Hyaline casts N/A Final   Mucus, UA Present (A) Not Estab. Final   Bacteria, UA Moderate (A) None seen/Few Final    Coagulation Studies: Recent Labs    04/14/24 1009  LABPROT 15.5*  INR 1.2    Urinalysis: No results for input(s): COLORURINE, LABSPEC, PHURINE, GLUCOSEU, HGBUR, BILIRUBINUR, KETONESUR, PROTEINUR,  UROBILINOGEN, NITRITE, LEUKOCYTESUR in the last 72 hours.  Invalid input(s): APPERANCEUR     Imaging: No results found.    Medications:    amiodarone  60 mg/hr (04/16/24 0914)   Followed by   amiodarone      heparin  1,750 Units/hr (04/16/24 0828)    Chlorhexidine  Gluconate Cloth  6 each Topical Q0600   docusate sodium   100 mg Oral BID   gabapentin   100 mg Oral Daily   lithium  carbonate  300 mg Oral Q supper   loratadine   10 mg Oral Daily   metoprolol  tartrate  25 mg Oral Q8H   multivitamin with minerals  1 tablet Oral Daily   primidone   25 mg Oral QHS   rosuvastatin   10 mg Oral Daily   acetaminophen , albuterol , dextromethorphan-guaiFENesin , hydrALAZINE , ondansetron  (ZOFRAN ) IV  Assessment/ Plan:  Luis Miller. is a 79 y.o.  male with end-stage renal disease on hemodialysis, hypertension, gout, depression, history of melanoma, Crohn's disease, history of stroke, bipolar disorder. 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   Last treatment received on Saturday. Appears stable today, will plan for short treatment tomorrow.   2. Anemia of chronic kidney disease Lab Results  Component Value Date   HGB 9.4 (L) 04/16/2024    Hgb 9.4 No need for ESA at this time.   3. Secondary Hyperparathyroidism: with outpatient labs: PTH 235, phosphorus 6.0, calcium  10.4 on 04/13/23.   Lab Results  Component Value Date   CALCIUM  10.1 04/16/2024   CAION 1.17 11/05/2021   PHOS 6.3 (H) 04/16/2024    Phos elevated, 6.3. Will continue to monitor improvements with dialysis.   4. Fall, unwitnessed. Imaging negative for acute fracture. PT/OT evaluations underway.    LOS: 4 Shyana Kulakowski 1/26/202611:01 AM   "

## 2024-04-16 NOTE — Progress Notes (Signed)
 "  Rounding Note   Patient Name: Luis Miller. Date of Encounter: 04/16/2024  [facility] Cardiologist: None  Consult completed by Dr Raford  Subjective Presented after being found down at home after missing dialysis was noted to be in atrial flutter.  He has been continued on IV heparin , metoprolol  tartrate 25 mg every 8 hours and amiodarone  infusion to be started this morning.  No overnight events recorded.  Prior documentation reveals patient is asymptomatic without chest pain or symptoms of decompensation.  Scheduled Meds:  amiodarone   150 mg Intravenous Once   Chlorhexidine  Gluconate Cloth  6 each Topical Q0600   docusate sodium   100 mg Oral BID   gabapentin   100 mg Oral Daily   lithium  carbonate  300 mg Oral Q supper   loratadine   10 mg Oral Daily   metoprolol  tartrate  25 mg Oral Q8H   multivitamin with minerals  1 tablet Oral Daily   primidone   25 mg Oral QHS   rosuvastatin   10 mg Oral Daily   Continuous Infusions:  amiodarone      Followed by   amiodarone      heparin  1,750 Units/hr (04/16/24 0730)   PRN Meds: acetaminophen , albuterol , dextromethorphan-guaiFENesin , hydrALAZINE , ondansetron  (ZOFRAN ) IV   Vital Signs  Vitals:   04/16/24 0011 04/16/24 0440 04/16/24 0500 04/16/24 0736  BP: (!) 146/82 121/75  (!) 157/88  Pulse: (!) 120 (!) 117  (!) 121  Resp: 20 15  15   Temp:  98.2 F (36.8 C)  97.7 F (36.5 C)  TempSrc:      SpO2: 99% 100%  100%  Weight:   85 kg   Height:        Intake/Output Summary (Last 24 hours) at 04/16/2024 0743 Last data filed at 04/16/2024 0300 Gross per 24 hour  Intake --  Output 400 ml  Net -400 ml      04/16/2024    5:00 AM 04/15/2024    5:00 AM 04/14/2024    1:08 PM  Last 3 Weights  Weight (lbs) 187 lb 6.3 oz 185 lb 3 oz 189 lb 6 oz  Weight (kg) 85 kg 84 kg 85.9 kg      Telemetry See MD attestation- Personally Reviewed  ECG  No new tracings- Personally Reviewed  Physical Exam See MD  attestation  Labs High Sensitivity Troponin:  No results for input(s): TROPONINIHS in the last 720 hours.  Recent Labs  Lab 04/11/24 1800 04/11/24 2319  TRNPT 114* 122*       Chemistry Recent Labs  Lab 04/12/24 1302 04/13/24 0812 04/14/24 0542 04/14/24 1009 04/15/24 0628 04/16/24 0003  NA 137 138 137  --  137 137  K 3.7 4.0 3.7  --  3.7 3.7  CL 95* 96* 96*  --  97* 97*  CO2 27 24 26   --  25 25  GLUCOSE 100* 92 103*  --  102* 93  BUN 52* 69* 48*  --  54* 68*  CREATININE 7.29* 9.27* 6.30*  --  6.49* 7.75*  CALCIUM  10.2 9.8 9.6  --  9.9 10.1  MG 2.8*  --   --  2.6* 2.5* 2.6*  PROT 7.0  --   --   --  6.2* 6.4*  ALBUMIN 3.9 3.5  --   --  3.4* 3.5  AST 25  --   --   --  25 23  ALT 7  --   --   --  10 12  ALKPHOS 75  --   --   --  65 64  BILITOT 1.0  --   --   --  0.5 0.4  GFRNONAA 7* 5* 8*  --  8* 7*  ANIONGAP 15 19* 14  --  16* 15    Lipids No results for input(s): CHOL, TRIG, HDL, LABVLDL, LDLCALC, CHOLHDL in the last 168 hours.  Hematology Recent Labs  Lab 04/14/24 0542 04/15/24 0628 04/16/24 0003  WBC 6.6 7.1 7.1  RBC 2.90* 2.83* 2.82*  HGB 10.0* 9.6* 9.4*  HCT 29.5* 28.9* 28.5*  MCV 101.7* 102.1* 101.1*  MCH 34.5* 33.9 33.3  MCHC 33.9 33.2 33.0  RDW 14.2 14.2 14.5  PLT 174 204 221   Thyroid  No results for input(s): TSH, FREET4 in the last 168 hours.  BNPNo results for input(s): BNP, PROBNP in the last 168 hours.  DDimer No results for input(s): DDIMER in the last 168 hours.   Radiology  No results found.  Cardiac Studies 2d echo 04/13/2024 1. Left ventricular ejection fraction, by estimation, is 60 to 65%. The  left ventricle has normal function. The left ventricle has no regional  wall motion abnormalities. Left ventricular diastolic parameters are  indeterminate.   2. Right ventricular systolic function is normal. The right ventricular  size is normal. There is normal pulmonary artery systolic pressure.   3. Left atrial size  was mildly dilated.   4. The mitral valve is normal in structure. No evidence of mitral valve  regurgitation. No evidence of mitral stenosis.   5. The aortic valve is normal in structure. Aortic valve regurgitation is  not visualized. No aortic stenosis is present.   6. The inferior vena cava is normal in size with greater than 50%  respiratory variability, suggesting right atrial pressure of 3 mmHg.   Patient Profile   79 y.o. male with a past medical history of end-stage renal disease on hemodialysis, CVA, junctional bradycardia, hypertension, hyperlipidemia, OSA, Crohn's disease, bipolar disorder who has been seen and evaluated for atrial flutter.  Assessment & Plan  Atrial flutter -No prior history of atrial fibrillation or atrial flutter -Continue IV heparin  infusion for CHA2DS2-VASc of at least 6 for stroke prophylaxis -Continued on metoprolol  25 mg every 8 hours - Rates continued to remain in the 1 teens 120s -IV amiodarone  ordered for this morning bolus and drip per protocol -If he continues to remain difficult to rate control consider TEE/DCCV prior to discharge -Hemoglobin has remained stable, if he has no issues on heparin  infusion transition to apixaban  prior to discharge -Continue with telemetry monitoring -TSH 0.600 - Echocardiogram revealed LVEF of 60 to 65%, no RWMA, diastolic parameters were indeterminate, no significant valvular abnormalities were noted  Demand ischemia -High-sensitivity troponin elevated on admission, trended to 122 and 114, not consistent with ACS -Not on more consistent with demand ischemia -EKG as needed for pain or changes -No ischemic workup pending at this time  Hyperlipidemia -LDL 46 -Continue on rosuvastatin   End-stage renal disease -Currently on hemodialysis -Ongoing management per nephrology   For questions or updates, please contact [facility] Please consult [email or web address] for contact info under       Signed, Kambrie Eddleman, NP  04/16/2024, 7:43 AM    "

## 2024-04-16 NOTE — Progress Notes (Signed)
 Went in patients room and he was removing CPAP.  He is now alert and oriented to person and place.  Put back on 3LNC.

## 2024-04-16 NOTE — TOC Initial Note (Signed)
 Transition of Care (TOC) - Initial/Assessment Note    Patient Details  Name: Luis Miller. MRN: [id number] Date of Birth: 1946/02/24  Transition of Care [facility]) CM/SW Contact:    Lauraine JAYSON Carpen, LCSW Phone Number: 04/16/2024, 2:59 PM  Clinical Narrative:  Patient not fully oriented. CSW called son, introduced role, and explained that discharge planning would be discussed. Son is agreeable to SNF placement but would like patient's input once mentation is improved. No further concerns. CSW will continue to follow patient and his son for support and facilitate discharge to SNF once medically stable.                Expected Discharge Plan: Skilled Nursing Facility Barriers to Discharge: Continued Medical Work up   Patient Goals and CMS Choice Patient states their goals for this hospitalization and ongoing recovery are:: Patient not fully oriented.          Expected Discharge Plan and Services     Post Acute Care Choice: Skilled Nursing Facility Living arrangements for the past 2 months: Single Family Home                                      Prior Living Arrangements/Services Living arrangements for the past 2 months: Single Family Home Lives with:: Self Patient language and need for interpreter reviewed:: Yes Do you feel safe going back to the place where you live?: Yes      Need for Family Participation in Patient Care: Yes (Comment) Care giver support system in place?: Yes (comment)   Criminal Activity/Legal Involvement Pertinent to Current Situation/Hospitalization: No - Comment as needed  Activities of Daily Living   ADL Screening (condition at time of admission) Independently performs ADLs?: Yes (appropriate for developmental age) Is the patient deaf or have difficulty hearing?: No Does the patient have difficulty seeing, even when wearing glasses/contacts?: No Does the patient have difficulty concentrating, remembering, or making decisions?:  No  Permission Sought/Granted Permission sought to share information with : Family Supports, Magazine Features Editor    Share Information with NAME: Kara Mierzejewski  Permission granted to share info w AGENCY: SNF's  Permission granted to share info w Relationship: Son  Permission granted to share info w Contact Information: [phone]  Emotional Assessment Appearance:: Appears stated age Attitude/Demeanor/Rapport: Unable to Assess Affect (typically observed): Unable to Assess Orientation: : Oriented to Self, Oriented to Place Alcohol / Substance Use: Not Applicable Psych Involvement: No (comment)  Admission diagnosis:  Mixed hyperlipidemia [E78.2] Uremia [N19] Weakness [R53.1] Insomnia due to medical condition [G47.01] Bipolar disorder, in full remission, most recent episode depressed [F31.76] Patient Active Problem List   Diagnosis Date Noted   Typical atrial flutter (HCC) 04/15/2024   QT prolongation 04/12/2024   Uremia 04/11/2024   Hyperkalemia 04/11/2024   Hypercalcemia 04/11/2024   Hypermagnesemia 04/11/2024   HTN (hypertension) 04/11/2024   Fall at home, initial encounter 04/11/2024   Thrombocytopenia 04/11/2024   Anemia in ESRD (end-stage renal disease) (HCC) 04/11/2024   Myocardial injury 04/11/2024   Gas pain 05/23/2023   Abnormal movements 05/04/2023   Hypertriglyceridemia 02/07/2023   Prediabetes 02/07/2023   Mild cognitive impairment 12/29/2022   Acute pyelonephritis 11/25/2022   Overweight (BMI 25.0-29.9) 11/24/2022   Anemia of chronic disease 11/23/2022   Constipation 11/23/2022   Raynaud's phenomenon 11/23/2022   Hypotension 11/22/2022   Gross hematuria 11/22/2022   Acute metabolic encephalopathy 11/22/2022  Hypoglycemia 11/22/2022   Rhabdomyolysis 11/21/2022   Bipolar 1 disorder, depressed, severe (HCC) 10/02/2022   Severe sepsis (HCC) 09/26/2022   SIRS (systemic inflammatory response syndrome) (HCC) 09/26/2022   Malfunction of  arteriovenous graft 09/20/2022   Bipolar I disorder, most recent episode depressed (HCC) 07/30/2022   Bipolar depression (HCC) 07/30/2022   Bipolar I disorder, mild, current or most recent episode depressed, with anxious distress (HCC) 07/26/2022   Anxiety disorder 01/06/2022   ESRD on hemodialysis (HCC) 08/24/2021   Obstructive sleep apnea 08/24/2021   Abnormal TSH 10/01/2020   Memory loss 05/19/2020   Hyperlipidemia 05/19/2020   At risk for prolonged QT interval syndrome 04/24/2020   Insomnia due to medical condition 04/24/2020   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   Hypertension associated with diabetes (HCC) 02/01/2019   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   Hemorrhoids, internal 12/12/2017   Hx of adenomatous colonic polyps 12/05/2017   Thrombosed external hemorrhoid 12/05/2017   Drug-induced tremor 05/08/2017   Secondary hyperparathyroidism of renal origin 04/05/2017   Advanced care planning/counseling discussion 04/05/2017   Carotid bruit 04/05/2017   Gout 02/26/2016   Erectile dysfunction due to arterial insufficiency 07/08/2015   Obesity 11/27/2014   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   Bipolar disorder, in full remission, most recent episode depressed 09/09/2014   High risk medication use 10/18/2013   Elevated prostate specific antigen (PSA) 01/13/2012   Benign prostatic hyperplasia with urinary obstruction 01/13/2012   Peyronie's disease 01/13/2012   PCP:  Marcelino Gales, MD Pharmacy:   [id number] - [place], [place] - [address] [place] [address] Phone: [phone] Fax: [phone]  [facility] - [place], [place] - [address] [place] [address] Phone: [phone] Fax: [phone]     Social Drivers of Health (SDOH) Social History: SDOH Screenings   Food Insecurity: No Food Insecurity (04/11/2024)  Housing: Low Risk (04/11/2024)  Transportation Needs: No Transportation Needs (04/11/2024)   Utilities: Not At Risk (04/11/2024)  Alcohol Screen: Low Risk (09/30/2022)  Depression (PHQ2-9): Low Risk (01/18/2024)  Financial Resource Strain: Low Risk  (01/20/2024)   Received from [facility]  Physical Activity: Insufficiently Active (12/07/2021)  Social Connections: Socially Isolated (04/11/2024)  Stress: No Stress Concern Present (12/07/2021)  Tobacco Use: Medium Risk (04/14/2024)   SDOH Interventions:     Readmission Risk Interventions     No data to display

## 2024-04-16 NOTE — Progress Notes (Signed)
 OT Cancellation Note  Patient Details Name: Luis Miller. MRN: [id number] DOB: [birth year]   Cancelled Treatment:    Reason Eval/Treat Not Completed: Medical issues which prohibited therapy. Chart reviewed, pt recently transferred to 2A for tachycardia and lethargy. Per primary RN, patient not medically ready/not able to tolerate therapy today. Advises hold. Will continue to follow and re-attempt next date as medically appropriate.   Elston Slot, M.S. OTR/L  04/16/24, 10:54 AM  ascom [phone]

## 2024-04-16 NOTE — Progress Notes (Signed)
 Patient transferred to 2A.  Very sleepy.  Contacted MD and she recommended him get put back on CPAP.  Amio gtt delayed d/t no iv access.  IV team consulted for stat IV.

## 2024-04-16 NOTE — Plan of Care (Signed)

## 2024-04-16 NOTE — Progress Notes (Signed)
 " PROGRESS NOTE    Luis Miller.  FMW:[id number] DOB: [birth year] DOA: 04/11/2024 PCP: Lateef, Munsoor, MD  Chief Complaint  Patient presents with   [facility] Course:  Luis Miller. is a 79 year old male with ESRD on HD MWF, hypertension, hyperlipidemia, junctional bradycardia, stroke, gout, memory loss, bipolar depression, BPH, OSA, anemia, thrombocytopenia, Crohn's disease, ulcerative colitis who presents after being found down. Patient was meant to have dialysis on day of arrival but did not arrive.  They performed a wellness check and patient was found on the ground outside of his home.  He reports he had been there for about an hour with generalized weakness.  He does not believe he fell and endorses a slow descent to the ground.  Head CT on arrival was without acute intracranial abnormality.  Patient has multiple lab derangements on arrival consistent with being overdue for dialysis including potassium of 5.7, calcium  11, bicarb 22, creatinine 12.9.  CXR negative for infiltration.  CT C-spine shows patchy ground glass infiltrates in the right upper lobe.  Patient was admitted and nephrology was consulted.  Patient received dialysis for the first time on 1/22.  He had significant improvement in labs afterwards.  Subjective: Patient was drowsy this morning and there was some concerns about his CPAP use.  He did eventually calm down.  VBG was well compensated.  At the time my evaluation he is alert and oriented, pleasant, no acute complaints  Objective: Vitals:   04/16/24 0736 04/16/24 0825 04/16/24 0917 04/16/24 1124  BP: (!) 157/88 139/82 114/88 106/82  Pulse: (!) 121     Resp: 15 (!) 22 16   Temp: 97.7 F (36.5 C)   97.6 F (36.4 C)  TempSrc:    Axillary  SpO2: 100% 100% 100%   Weight:      Height:        Intake/Output Summary (Last 24 hours) at 04/16/2024 1417 Last data filed at 04/16/2024 0300 Gross per 24 hour  Intake --  Output 400 ml  Net -400 ml    Filed Weights   04/14/24 1308 04/15/24 0500 04/16/24 0500  Weight: 85.9 kg 84 kg 85 kg    Examination: General exam: Appears calm and comfortable Respiratory system: No work of breathing, symmetric chest wall expansion Cardiovascular system: S1 & S2 heard, RRR.  Gastrointestinal system: Abdomen is nondistended, soft and nontender.  Neuro: Alert and oriented to self, and place.  Disoriented to year today  Assessment & Plan:  Principal Problem:   Uremia Active Problems:   Junctional bradycardia   Hyperkalemia   Hypercalcemia   Hypermagnesemia   Fall at home, initial encounter   Myocardial injury   HTN (hypertension)   Hyperlipidemia   ESRD on hemodialysis (HCC)   Thrombocytopenia   Anemia in ESRD (end-stage renal disease) (HCC)   Bipolar depression (HCC)   Obstructive sleep apnea   Memory loss   Overweight (BMI 25.0-29.9)   QT prolongation   Typical atrial flutter (HCC)    Uremia ESRD on HD Electrolyte disturbances including hyperkalemia, hypercalcemia, hypomagnesemia - Last dialysis 1 week prior to arrival.  Patient appears to have been having some transportation concerns - Patient with uremia on arrival, potassium 5.7, magnesium  3.5, BUN 101 - Now status post dialysis x 3.  Mental status waxes and wanes but is closer to his baseline now - With HD per nephro.  Likely session tomorrow - Per nephrology venous site has been  difficult to access but does appear to be functional.  Had recent fistulogram without intervention needed - Continue to monitor CMP.  Labs improving  Prolonged QT interval, ruled out - QTc very prolonged on EKG, however after further review by cardiology appears that this is misinterpreted due to possible A-flutter/AV block -Antipsychotics have been held - Continue telemetry monitoring  Atrial flutter Atrial fibrillation with RVR - Cardiology has been consulted - Minimally responsive to metoprolol  and amiodarone  bolus - Amiodarone  drip  started today - if this is not successful they are planning for TEE/DCCV prior to discharge - Continue with heparin  drip for now with eventual transition to Eliquis  - Echo: LVEF 60 to 65%, no RWMA, diastolic parameters indeterminate.  No significant valvular abnormalities noted - Continue close monitoring on telemetry  Metabolic encephalopathy - Presumed secondary to uremia.,  Waxes and wanes - Has gradually resolved - Frequent reorientation, delirium precautions - Psychiatric medications held on arrival due to persistent somnolence.  Gradually resume - 2 head CT is without acute intracranial abnormality - Continue PT/OT as able to participate  Junctional bradycardia - Chronic.  Heart rate regularly in the 50s. - Patient has actually been elevated throughout the day.  See above - Avoid scheduled nodal blockers  Fall at home, initial encounter - Trauma scans negative for acute injury - PT/OT  Elevated troponin - Troponin 114 --> 122 no chest pain - Likely demand ischemia with decreased clearance due to ESRD.  Cardiology aware. - Continue statin.  Hypertension - Resume home meds  Hyperlipidemia - Continue statin  Thrombocytopenia, chronic - Platelets stable - Trend CBC  Anemia in ESRD - Hemoglobin 9.1 - Follow CBC, no acute bleeding  Bipolar depression Supratherapeutic lithium  - Patient's bipolar has been very difficult to manage.  Significant success with lithium .  Was supratherapeutic on arrival after months of being therapeutic.  Likely secondary to missed dialysis.  Lithium  was held. - Repeat lithium  level on 1/24 is subtherapeutic.  Lithium  restarted. - Repeat lithium  level tomorrow  OSA - CPAP at night  Memory loss - On donepezil  at home  Body mass index is 30.25 kg/m. Obesity Class 1 - Outpatient follow up for lifestyle modification and risk factor management   DVT prophylaxis: Heparin    Code Status: Full Code Disposition:  Inpt pending clinical  resolution in HR. Hopefully after HD  Consultants:  Treatment Team:  Consulting Physician: Raford Riggs, MD Consulting Physician: Perla Evalene PARAS, MD  Procedures:    Antimicrobials:  Anti-infectives (From admission, onward)    None       Data Reviewed: I have personally reviewed following labs and imaging studies CBC: Recent Labs  Lab 04/12/24 0425 04/13/24 0812 04/14/24 0542 04/15/24 0628 04/16/24 0003  WBC 7.9 7.9 6.6 7.1 7.1  HGB 9.8* 9.3* 10.0* 9.6* 9.4*  HCT 31.0* 28.4* 29.5* 28.9* 28.5*  MCV 108.4* 103.6* 101.7* 102.1* 101.1*  PLT 156 178 174 204 221   Basic Metabolic Panel: Recent Labs  Lab 04/11/24 1800 04/11/24 2319 04/12/24 1302 04/13/24 0812 04/14/24 0542 04/14/24 1009 04/15/24 0628 04/16/24 0003  NA 140   < > 137 138 137  --  137 137  K 5.7*   < > 3.7 4.0 3.7  --  3.7 3.7  CL 99   < > 95* 96* 96*  --  97* 97*  CO2 22   < > 27 24 26   --  25 25  GLUCOSE 114*   < > 100* 92 103*  --  102*  93  BUN 101*   < > 52* 69* 48*  --  54* 68*  CREATININE 12.90*   < > 7.29* 9.27* 6.30*  --  6.49* 7.75*  CALCIUM  11.0*   < > 10.2 9.8 9.6  --  9.9 10.1  MG 3.5*  --  2.8*  --   --  2.6* 2.5* 2.6*  PHOS  --   --  4.8* 6.8*  --  5.3* 5.5* 6.3*   < > = values in this interval not displayed.   GFR: Estimated Creatinine Clearance: 7.9 mL/min (A) (by C-G formula based on SCr of 7.75 mg/dL (H)). Liver Function Tests: Recent Labs  Lab 04/11/24 1800 04/12/24 1302 04/13/24 0812 04/15/24 0628 04/16/24 0003  AST 22 25  --  25 23  ALT 8 7  --  10 12  ALKPHOS 70 75  --  65 64  BILITOT 0.6 1.0  --  0.5 0.4  PROT 6.7 7.0  --  6.2* 6.4*  ALBUMIN 4.1 3.9 3.5 3.4* 3.5   CBG: No results for input(s): GLUCAP in the last 168 hours.  No results found for this or any previous visit (from the past 240 hours).   Radiology Studies: No results found.   Scheduled Meds:  Chlorhexidine  Gluconate Cloth  6 each Topical Q0600   docusate sodium   100 mg Oral BID    gabapentin   100 mg Oral Daily   lithium  carbonate  300 mg Oral Q supper   loratadine   10 mg Oral Daily   metoprolol  tartrate  25 mg Oral Q8H   multivitamin with minerals  1 tablet Oral Daily   primidone   25 mg Oral QHS   rosuvastatin   10 mg Oral Daily   Continuous Infusions:  amiodarone      heparin  1,750 Units/hr (04/16/24 0828)      LOS: 4 days  MDM: Patient is high risk for one or more organ failure.  They necessitate ongoing hospitalization for continued IV therapies and subsequent lab monitoring. Total time spent interpreting labs and vitals, reviewing the medical record, coordinating care amongst consultants and care team members, directly assessing and discussing care with the patient and/or family: 55 min  Leonilda Cozby, DO [facility]  To contact the attending physician between 7A-7P please use Epic Chat. To contact the covering physician during after hours 7P-7A, please review Amion.  04/16/2024, 2:17 PM   *This document has been created with the assistance of dictation software. Please excuse typographical errors. *   "

## 2024-04-16 NOTE — Progress Notes (Signed)
 Respiratory recommended VBG d/t lethargy.  MD contacted and received order.

## 2024-04-16 NOTE — Progress Notes (Signed)
 Patient has second IV. Amio gtt started.  VSS.  Patient remains sleepy, CPAP put on after receiving it from 1A.  Respiratory called to assess patient and verify CPAP placement.

## 2024-04-16 NOTE — [place] FL2 (Signed)
 " [place]  MEDICAID FL2 LEVEL OF CARE FORM     IDENTIFICATION  Patient Name: Luis Miller. Birthdate: 01-Mar-1946 Sex: male Admission Date (Current Location): 04/11/2024  [place] and [place] Number:  Chiropodist and Address:  [facility], [address] [place], [place] [address]      Provider Number: [id number]  Attending Physician Name and Address:  Leesa Kast, DO  Relative Name and Phone Number:       Current Level of Care: Hospital Recommended Level of Care: Skilled Nursing Facility Prior Approval Number:    Date Approved/Denied:   PASRR Number: [id number]  Discharge Plan: SNF    Current Diagnoses: Patient Active Problem List   Diagnosis Date Noted   Typical atrial flutter (HCC) 04/15/2024   QT prolongation 04/12/2024   Uremia 04/11/2024   Hyperkalemia 04/11/2024   Hypercalcemia 04/11/2024   Hypermagnesemia 04/11/2024   HTN (hypertension) 04/11/2024   Fall at home, initial encounter 04/11/2024   Thrombocytopenia 04/11/2024   Anemia in ESRD (end-stage renal disease) (HCC) 04/11/2024   Myocardial injury 04/11/2024   Gas pain 05/23/2023   Abnormal movements 05/04/2023   Hypertriglyceridemia 02/07/2023   Prediabetes 02/07/2023   Mild cognitive impairment 12/29/2022   Acute pyelonephritis 11/25/2022   Overweight (BMI 25.0-29.9) 11/24/2022   Anemia of chronic disease 11/23/2022   Constipation 11/23/2022   Raynaud's phenomenon 11/23/2022   Hypotension 11/22/2022   Gross hematuria 11/22/2022   Acute metabolic encephalopathy 11/22/2022   Hypoglycemia 11/22/2022   Rhabdomyolysis 11/21/2022   Bipolar 1 disorder, depressed, severe (HCC) 10/02/2022   Severe sepsis (HCC) 09/26/2022   SIRS (systemic inflammatory response syndrome) (HCC) 09/26/2022   Malfunction of arteriovenous graft 09/20/2022   Bipolar I disorder, most recent episode depressed (HCC) 07/30/2022   Bipolar depression (HCC) 07/30/2022   Bipolar I  disorder, mild, current or most recent episode depressed, with anxious distress (HCC) 07/26/2022   Anxiety disorder 01/06/2022   ESRD on hemodialysis (HCC) 08/24/2021   Obstructive sleep apnea 08/24/2021   Abnormal TSH 10/01/2020   Memory loss 05/19/2020   Hyperlipidemia 05/19/2020   At risk for prolonged QT interval syndrome 04/24/2020   Insomnia due to medical condition 04/24/2020   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   Hypertension associated with diabetes (HCC) 02/01/2019   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   Hemorrhoids, internal 12/12/2017   Hx of adenomatous colonic polyps 12/05/2017   Thrombosed external hemorrhoid 12/05/2017   Drug-induced tremor 05/08/2017   Secondary hyperparathyroidism of renal origin 04/05/2017   Advanced care planning/counseling discussion 04/05/2017   Carotid bruit 04/05/2017   Gout 02/26/2016   Erectile dysfunction due to arterial insufficiency 07/08/2015   Obesity 11/27/2014   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   Bipolar disorder, in full remission, most recent episode depressed 09/09/2014   High risk medication use 10/18/2013   Elevated prostate specific antigen (PSA) 01/13/2012   Benign prostatic hyperplasia with urinary obstruction 01/13/2012   Peyronie's disease 01/13/2012    Orientation RESPIRATION BLADDER Height & Weight     Self, Place  O2, Other (Comment) (Nasal Cannula 3 L. Has home CPAP.) Incontinent Weight: 187 lb 6.3 oz (85 kg) Height:  5' 6 (167.6 cm)  BEHAVIORAL SYMPTOMS/MOOD NEUROLOGICAL BOWEL NUTRITION STATUS   (None)  (None) Incontinent Diet (Renal with fluid restriction: 1200 mL.)  AMBULATORY STATUS COMMUNICATION OF NEEDS Skin   Limited Assist Verbally Skin abrasions, Bruising  Personal Care Assistance Level of Assistance  Bathing, Feeding, Dressing Bathing Assistance: Limited assistance Feeding assistance: Limited assistance Dressing Assistance: Limited assistance     Functional Limitations  Info  Sight, Hearing, Speech Sight Info: Adequate Hearing Info: Adequate Speech Info: Adequate    SPECIAL CARE FACTORS FREQUENCY  PT (By licensed PT), OT (By licensed OT)     PT Frequency: 5 x week OT Frequency: 5 x week            Contractures Contractures Info: Not present    Additional Factors Info  Code Status, Allergies Code Status Info: Full code Allergies Info: Mirtazapine , Aspirin, Atomoxetine, Strattera (Atomoxetine Hcl), Xyzal  (Levocetirizine Dihydrochloride )           Current Medications (04/16/2024):  This is the current hospital active medication list Current Facility-Administered Medications  Medication Dose Route Frequency Provider Last Rate Last Admin   acetaminophen  (TYLENOL ) tablet 650 mg  650 mg Oral Q6H PRN Niu, Xilin, MD       albuterol  (PROVENTIL ) (2.5 MG/3ML) 0.083% nebulizer solution 2.5 mg  2.5 mg Nebulization Q4H PRN Niu, Xilin, MD       amiodarone  (NEXTERONE  PREMIX) 360-4.14 MG/200ML-% (1.8 mg/mL) IV infusion  30 mg/hr Intravenous Continuous Belue, Rankin RAMAN, RPH       Chlorhexidine  Gluconate Cloth 2 % PADS 6 each  6 each Topical Q0600 Dennise Capri, MD   6 each at 04/16/24 0610   dextromethorphan-guaiFENesin  (MUCINEX  DM) 30-600 MG per 12 hr tablet 1 tablet  1 tablet Oral BID PRN Niu, Xilin, MD       docusate sodium  (COLACE) capsule 100 mg  100 mg Oral BID Dezii, Alexandra, DO   100 mg at 04/15/24 2130   gabapentin  (NEURONTIN ) capsule 100 mg  100 mg Oral Daily Niu, Xilin, MD   100 mg at 04/16/24 1422   heparin  ADULT infusion 100 units/mL (25000 units/250mL)  1,750 Units/hr Intravenous Continuous Dezii, Alexandra, DO 17.5 mL/hr at 04/16/24 0828 1,750 Units/hr at 04/16/24 0828   hydrALAZINE  (APRESOLINE ) injection 5 mg  5 mg Intravenous Q2H PRN Niu, Xilin, MD       lithium  carbonate (LITHOBID ) ER tablet 300 mg  300 mg Oral Q supper Dezii, Alexandra, DO   300 mg at 04/15/24 1607   loratadine  (CLARITIN ) tablet 10 mg  10 mg Oral Daily Niu, Xilin, MD   10  mg at 04/15/24 0731   metoprolol  tartrate (LOPRESSOR ) tablet 25 mg  25 mg Oral Q8H Raford Riggs, MD   25 mg at 04/16/24 1419   multivitamin with minerals tablet 1 tablet  1 tablet Oral Daily Niu, Xilin, MD   1 tablet at 04/15/24 9268   ondansetron  (ZOFRAN ) injection 4 mg  4 mg Intravenous Q8H PRN Niu, Xilin, MD       primidone  (MYSOLINE ) tablet 25 mg  25 mg Oral QHS Niu, Xilin, MD   25 mg at 04/15/24 2130   rosuvastatin  (CRESTOR ) tablet 10 mg  10 mg Oral Daily Niu, Xilin, MD   10 mg at 04/16/24 1422     Discharge Medications: Please see discharge summary for a list of discharge medications.  Relevant Imaging Results:  Relevant Lab Results:   Additional Information SS#: [id number]. HD at [facility] on MWF at 11:45am.  Lauraine JAYSON Carpen, LCSW     "
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
## 2024-04-16 NOTE — Progress Notes (Signed)
" °   04/15/24 2033  Assess: MEWS Score  Temp 98.8 F (37.1 C)  BP (!) 155/87  MAP (mmHg) 105  Pulse Rate (!) 121  Resp 20  SpO2 98 %  O2 Device Nasal Cannula  O2 Flow Rate (L/min) 3 L/min  Assess: MEWS Score  MEWS Temp 0  MEWS Systolic 0  MEWS Pulse 2  MEWS RR 0  MEWS LOC 0  MEWS Score 2  MEWS Score Color Yellow  Assess: if the MEWS score is Yellow or Red  Were vital signs accurate and taken at a resting state? Yes  Does the patient meet 2 or more of the SIRS criteria? No  MEWS guidelines implemented  Yes, yellow  Treat  MEWS Interventions Considered administering scheduled or prn medications/treatments as ordered  Take Vital Signs  Increase Vital Sign Frequency  Yellow: Q2hr x1, continue Q4hrs until patient remains green for 12hrs  Escalate  MEWS: Escalate Yellow: Discuss with charge nurse and consider notifying provider and/or RRT  Notify: Charge Nurse/RN  Name of Charge Nurse/RN Notified Tax Adviser  Provider Notification  Provider Name/Title Laneta Boston  Date Provider Notified 04/15/24  Time Provider Notified 2100  Method of Notification Page  Notification Reason  (Continued Yellow MEWS. Patient has been tachy since 0800  HR running over 120, order states target HR 65-105.)  Provider response No new orders  Date of Provider Response 04/15/24  Time of Provider Response 2105  Assess: SIRS CRITERIA  SIRS Temperature  0  SIRS Respirations  0  SIRS Pulse 1  SIRS WBC 0  SIRS Score Sum  1    "

## 2024-04-17 ENCOUNTER — Telehealth (HOSPITAL_COMMUNITY): Payer: Self-pay

## 2024-04-17 ENCOUNTER — Other Ambulatory Visit (HOSPITAL_COMMUNITY): Payer: Self-pay

## 2024-04-17 DIAGNOSIS — I4892 Unspecified atrial flutter: Secondary | ICD-10-CM

## 2024-04-17 DIAGNOSIS — N19 Unspecified kidney failure: Secondary | ICD-10-CM | POA: Diagnosis not present

## 2024-04-17 DIAGNOSIS — I480 Paroxysmal atrial fibrillation: Secondary | ICD-10-CM

## 2024-04-17 LAB — COMPREHENSIVE METABOLIC PANEL WITH GFR
ALT: 18 U/L (ref 0–44)
AST: 30 U/L (ref 15–41)
Albumin: 3.5 g/dL (ref 3.5–5.0)
Alkaline Phosphatase: 69 U/L (ref 38–126)
Anion gap: 18 — ABNORMAL HIGH (ref 5–15)
BUN: 80 mg/dL — ABNORMAL HIGH (ref 8–23)
CO2: 23 mmol/L (ref 22–32)
Calcium: 9.4 mg/dL (ref 8.9–10.3)
Chloride: 97 mmol/L — ABNORMAL LOW (ref 98–111)
Creatinine, Ser: 8.79 mg/dL — ABNORMAL HIGH (ref 0.61–1.24)
GFR, Estimated: 6 mL/min — ABNORMAL LOW
Glucose, Bld: 96 mg/dL (ref 70–99)
Potassium: 3.9 mmol/L (ref 3.5–5.1)
Sodium: 137 mmol/L (ref 135–145)
Total Bilirubin: 0.3 mg/dL (ref 0.0–1.2)
Total Protein: 6 g/dL — ABNORMAL LOW (ref 6.5–8.1)

## 2024-04-17 LAB — CBC
HCT: 28.1 % — ABNORMAL LOW (ref 39.0–52.0)
Hemoglobin: 9.2 g/dL — ABNORMAL LOW (ref 13.0–17.0)
MCH: 33.8 pg (ref 26.0–34.0)
MCHC: 32.7 g/dL (ref 30.0–36.0)
MCV: 103.3 fL — ABNORMAL HIGH (ref 80.0–100.0)
Platelets: 209 10*3/uL (ref 150–400)
RBC: 2.72 MIL/uL — ABNORMAL LOW (ref 4.22–5.81)
RDW: 14.4 % (ref 11.5–15.5)
WBC: 6.3 10*3/uL (ref 4.0–10.5)
nRBC: 0 % (ref 0.0–0.2)

## 2024-04-17 LAB — HEPARIN LEVEL (UNFRACTIONATED): Heparin Unfractionated: 0.68 [IU]/mL (ref 0.30–0.70)

## 2024-04-17 LAB — LITHIUM LEVEL: Lithium Lvl: 0.82 mmol/L (ref 0.60–1.20)

## 2024-04-17 LAB — PHOSPHORUS: Phosphorus: 6.5 mg/dL — ABNORMAL HIGH (ref 2.5–4.6)

## 2024-04-17 LAB — MAGNESIUM: Magnesium: 2.7 mg/dL — ABNORMAL HIGH (ref 1.7–2.4)

## 2024-04-17 MED ORDER — EPOETIN ALFA-EPBX 4000 UNIT/ML IJ SOLN
4000.0000 [IU] | INTRAMUSCULAR | Status: DC
  Administered 2024-04-18 – 2024-04-20 (×2): 4000 [IU] via INTRAVENOUS
  Filled 2024-04-17: qty 1

## 2024-04-17 MED ORDER — APIXABAN 5 MG PO TABS
5.0000 mg | ORAL_TABLET | Freq: Two times a day (BID) | ORAL | Status: DC
  Administered 2024-04-17 – 2024-04-19 (×5): 5 mg via ORAL
  Filled 2024-04-17 (×5): qty 1

## 2024-04-17 MED ORDER — SODIUM CHLORIDE 0.9 % IV SOLN: INTRAVENOUS | Status: DC

## 2024-04-17 NOTE — Progress Notes (Signed)
 " PROGRESS NOTE    Luis Miller.  FMW:[id number] DOB: Jan 29, 1946 DOA: 04/11/2024 PCP: Lateef, Munsoor, MD  Chief Complaint  Patient presents with   [facility] Course:  Luis Miller. is a 79 year old male with ESRD on HD MWF, hypertension, hyperlipidemia, junctional bradycardia, stroke, gout, memory loss, bipolar depression, BPH, OSA, anemia, thrombocytopenia, Crohn's disease, ulcerative colitis who presents after being found down. Patient was meant to have dialysis on day of arrival but did not arrive.  They performed a wellness check and patient was found on the ground outside of his home.  He reports he had been there for about an hour with generalized weakness.  He does not believe he fell and endorses a slow descent to the ground.  Head CT on arrival was without acute intracranial abnormality.  Patient has multiple lab derangements on arrival consistent with being overdue for dialysis including potassium of 5.7, calcium  11, bicarb 22, creatinine 12.9.  CXR negative for infiltration.  CT C-spine shows patchy ground glass infiltrates in the right upper lobe.  Patient was admitted and nephrology was consulted.  Patient received dialysis for the first time on 1/22.  He had significant improvement in labs afterwards. Stay then prolonged due to A-fib/flutter with RVR.  He was started on amiodarone  and metoprolol  but rate remained difficult to control.  Cardiology now planning for TEE/DCCV tomorrow.  Subjective: No acute events overnight.  Heart rate continues to be elevated above 110.  Mental status waxes and wanes  Objective: Vitals:   04/17/24 1215 04/17/24 1230 04/17/24 1245 04/17/24 1422  BP: (!) 122/55 (!) 122/105 111/76 122/73  Pulse: (!) 116 (!) 113  (!) 116  Resp: (!) 21 20 (!) 22 19  Temp:    99 F (37.2 C)  TempSrc:    Oral  SpO2: (!) 10% 99% 99% 100%  Weight:      Height:        Intake/Output Summary (Last 24 hours) at 04/17/2024 1626 Last data filed at  04/17/2024 1400 Gross per 24 hour  Intake 492.64 ml  Output 2450 ml  Net -1957.36 ml   Filed Weights   04/16/24 0500 04/17/24 0500 04/17/24 1000  Weight: 85 kg 86.2 kg 86.3 kg    Examination: General exam: Appears calm and comfortable Respiratory system: No work of breathing, symmetric chest wall expansion Cardiovascular system: S1 & S2 heard, RRR.  Gastrointestinal system: Abdomen is nondistended, soft and nontender.  Neuro: Alert and oriented to self, place and situation  Assessment & Plan:  Principal Problem:   Uremia Active Problems:   Junctional bradycardia   Hyperkalemia   Hypercalcemia   Hypermagnesemia   Fall at home, initial encounter   Myocardial injury   HTN (hypertension)   Hyperlipidemia   ESRD on hemodialysis (HCC)   Thrombocytopenia   Anemia in ESRD (end-stage renal disease) (HCC)   Bipolar depression (HCC)   Obstructive sleep apnea   Memory loss   Overweight (BMI 25.0-29.9)   QT prolongation   Typical atrial flutter (HCC)    Uremia ESRD on HD Electrolyte disturbances including hyperkalemia, hypercalcemia, hypomagnesemia - Last dialysis 1 week prior to arrival.  Patient appears to have been having some transportation concerns - Patient with uremia on arrival, potassium 5.7, magnesium  3.5, BUN 101 - Now status post dialysis x 4.  Mental status waxes and wanes but is closer to his baseline now - Continue with HD per nephro - Per nephrology  venous site has been difficult to access but does appear to be functional.  Had recent fistulogram without intervention needed  Prolonged QT interval, ruled out - QTc very prolonged on EKG, however after further review by cardiology appears that this is misinterpreted due to possible A-flutter/AV block -Antipsychotics have been held - Continue telemetry monitoring  Atrial flutter Atrial fibrillation with RVR - Cardiology has been consulted - Has been minimally responsive to metoprolol  and amiodarone  -  Cardiology now planning for TEE/DCCV tomorrow 1/28 - Has been on heparin  drip but cardiology cleared for transition to Eliquis   - Echo: LVEF 60 to 65%, no RWMA, diastolic parameters indeterminate.  No significant valvular abnormalities noted - Continue close monitoring on telemetry  Metabolic encephalopathy - Presumed secondary to uremia - Continues to wax and wane.  At baseline today - Continue frequent reorientation and delirium precautions - Psychiatric medications held on arrival due to persistent somnolence.  Gradually resume - 2 head CT is without acute intracranial abnormality - Continue PT/OT as able to participate  History of junctional bradycardia - Chronic.  Heart rate regularly in the 50s. - Patient has actually been elevated throughout the day.  See above - Avoid scheduled nodal blockers  Fall at home, initial encounter - Trauma scans negative for acute injury - PT/OT  Elevated troponin - Troponin 114 --> 122 no chest pain - Likely demand ischemia with decreased clearance due to ESRD.  Cardiology aware. - Continue statin.  Hypertension - Resume home meds  Hyperlipidemia - Continue statin  Thrombocytopenia, chronic - Platelets stable - Trend CBC  Anemia in ESRD - Hemoglobin 9.1 - Follow CBC, no acute bleeding  Bipolar depression Supratherapeutic lithium  - Patient's bipolar has been very difficult to manage.  Significant success with lithium .  Was supratherapeutic on arrival after months of being therapeutic.  Likely secondary to missed dialysis.  Lithium  was held. - Repeat lithium  level on 1/24 is subtherapeutic.  Lithium  restarted. - Lithium  levels better now.  Continue current dosing  OSA - CPAP at night  Memory loss - On donepezil  at home  Body mass index is 30.71 kg/m. Obesity Class 1 - Outpatient follow up for lifestyle modification and risk factor management   DVT prophylaxis: Eliquis    Code Status: Full Code Disposition: TEE/DCCV  tomorrow  Consultants:  Treatment Team:  Consulting Physician: Raford Riggs, MD Consulting Physician: Perla Evalene PARAS, MD  Procedures:    Antimicrobials:  Anti-infectives (From admission, onward)    None       Data Reviewed: I have personally reviewed following labs and imaging studies CBC: Recent Labs  Lab 04/13/24 0812 04/14/24 0542 04/15/24 0628 04/16/24 0003 04/17/24 0327  WBC 7.9 6.6 7.1 7.1 6.3  HGB 9.3* 10.0* 9.6* 9.4* 9.2*  HCT 28.4* 29.5* 28.9* 28.5* 28.1*  MCV 103.6* 101.7* 102.1* 101.1* 103.3*  PLT 178 174 204 221 209   Basic Metabolic Panel: Recent Labs  Lab 04/12/24 1302 04/13/24 0812 04/14/24 0542 04/14/24 1009 04/15/24 0628 04/16/24 0003 04/17/24 0327  NA 137 138 137  --  137 137 137  K 3.7 4.0 3.7  --  3.7 3.7 3.9  CL 95* 96* 96*  --  97* 97* 97*  CO2 27 24 26   --  25 25 23   GLUCOSE 100* 92 103*  --  102* 93 96  BUN 52* 69* 48*  --  54* 68* 80*  CREATININE 7.29* 9.27* 6.30*  --  6.49* 7.75* 8.79*  CALCIUM  10.2 9.8 9.6  --  9.9  10.1 9.4  MG 2.8*  --   --  2.6* 2.5* 2.6* 2.7*  PHOS 4.8* 6.8*  --  5.3* 5.5* 6.3* 6.5*   GFR: Estimated Creatinine Clearance: 7 mL/min (A) (by C-G formula based on SCr of 8.79 mg/dL (H)). Liver Function Tests: Recent Labs  Lab 04/11/24 1800 04/12/24 1302 04/13/24 0812 04/15/24 0628 04/16/24 0003 04/17/24 0327  AST 22 25  --  25 23 30   ALT 8 7  --  10 12 18   ALKPHOS 70 75  --  65 64 69  BILITOT 0.6 1.0  --  0.5 0.4 0.3  PROT 6.7 7.0  --  6.2* 6.4* 6.0*  ALBUMIN 4.1 3.9 3.5 3.4* 3.5 3.5   CBG: No results for input(s): GLUCAP in the last 168 hours.  No results found for this or any previous visit (from the past 240 hours).   Radiology Studies: No results found.   Scheduled Meds:  apixaban   5 mg Oral BID   Chlorhexidine  Gluconate Cloth  6 each Topical Q0600   docusate sodium   100 mg Oral BID   [START ON 04/18/2024] epoetin  alfa-epbx (RETACRIT ) injection  4,000 Units Intravenous Q  M,W,F-1800   gabapentin   100 mg Oral Daily   lithium  carbonate  300 mg Oral Q supper   loratadine   10 mg Oral Daily   metoprolol  tartrate  25 mg Oral Q8H   multivitamin with minerals  1 tablet Oral Daily   primidone   25 mg Oral QHS   rosuvastatin   10 mg Oral Daily   Continuous Infusions:  amiodarone  30 mg/hr (04/17/24 0811)      LOS: 5 days  MDM: Patient is high risk for one or more organ failure.  They necessitate ongoing hospitalization for continued IV therapies and subsequent lab monitoring. Total time spent interpreting labs and vitals, reviewing the medical record, coordinating care amongst consultants and care team members, directly assessing and discussing care with the patient and/or family: 55 min  Zhi Geier, DO [facility]  To contact the attending physician between 7A-7P please use Epic Chat. To contact the covering physician during after hours 7P-7A, please review Amion.  04/17/2024, 4:26 PM   *This document has been created with the assistance of dictation software. Please excuse typographical errors. *   "

## 2024-04-17 NOTE — Progress Notes (Signed)
 " [facility]  ROUNDING NOTE   Subjective:   Luis Yin. is a 79 year old male with end-stage renal disease on hemodialysis, hypertension, gout, depression, history of melanoma, Crohn's disease, history of stroke, bipolar disorder. 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 SABRA Update:  Patient seen and evaluated during dialysis   HEMODIALYSIS FLOWSHEET:  Blood Flow Rate (mL/min): 399 mL/min Arterial Pressure (mmHg): -165.24 mmHg Venous Pressure (mmHg): 205.45 mmHg TMP (mmHg): 7.07 mmHg Ultrafiltration Rate (mL/min): 987 mL/min Dialysate Flow Rate (mL/min): 299 ml/min Dialysis Fluid Bolus: Normal Saline Bolus Amount (mL): 200 mL (Patient is under edw. Patient received saline bolus for elevate heart rate)  No complaints at this time  Objective:  Vital signs in last 24 hours:  Temp:  [97.8 F (36.6 C)-98.4 F (36.9 C)] 98.4 F (36.9 C) (01/27 1200) Pulse Rate:  [53-118] 113 (01/27 1230) Resp:  [15-22] 22 (01/27 1245) BP: (105-149)/(55-105) 111/76 (01/27 1245) SpO2:  [10 %-100 %] 99 % (01/27 1245) FiO2 (%):  [28 %] 28 % (01/26 2305) Weight:  [86.2 kg-86.3 kg] 86.3 kg (01/27 1000)  Weight change: 1.2 kg Filed Weights   04/16/24 0500 04/17/24 0500 04/17/24 1000  Weight: 85 kg 86.2 kg 86.3 kg    Intake/Output: I/O last 3 completed shifts: In: 787.7 [I.V.:787.7] Out: 1100 [Urine:1100]   Intake/Output this shift:  Total I/O In: -  Out: 250 [Urine:250]  Physical Exam: General: NAD, resting comfortably, flat affect  Head: Normocephalic  Eyes: Anicteric  Lungs:  Clear to auscultation, O2 [place]  Heart:  Irregular  Abdomen:  Soft, nontender  Extremities:  Trace peripheral edema.  Neurologic: Awake, alert  Skin: Warm,dry, no rash  Access: Left AVF    Basic Metabolic Panel: Recent Labs  Lab 04/12/24 1302 04/13/24 0812 04/14/24 0542 04/14/24 1009 04/15/24 0628 04/16/24 0003 04/17/24 0327  NA 137 138 137  --  137 137 137  K 3.7 4.0 3.7  --  3.7 3.7 3.9  CL 95* 96* 96*  --  97* 97* 97*  CO2 27 24 26   --  25 25 23   GLUCOSE 100* 92 103*  --  102* 93 96  BUN 52* 69* 48*  --  54* 68* 80*  CREATININE 7.29* 9.27* 6.30*  --  6.49* 7.75* 8.79*  CALCIUM  10.2 9.8 9.6  --  9.9 10.1 9.4  MG 2.8*  --   --  2.6* 2.5* 2.6* 2.7*  PHOS 4.8* 6.8*  --  5.3* 5.5* 6.3* 6.5*    Liver Function Tests: Recent Labs  Lab 04/11/24 1800 04/12/24 1302 04/13/24 0812 04/15/24 0628 04/16/24 0003 04/17/24 0327  AST 22 25  --  25 23 30   ALT 8 7  --  10 12 18   ALKPHOS 70 75  --  65 64 69  BILITOT 0.6 1.0  --  0.5 0.4 0.3  PROT 6.7 7.0  --  6.2* 6.4* 6.0*  ALBUMIN 4.1 3.9 3.5 3.4* 3.5 3.5   No results for input(s): LIPASE, AMYLASE in the last 168 hours. No results for input(s): AMMONIA in the last 168 hours.  CBC: Recent Labs  Lab 04/13/24 9187 04/14/24 0542 04/15/24 9371 04/16/24 0003 04/17/24 0327  WBC 7.9 6.6 7.1 7.1 6.3  HGB 9.3* 10.0* 9.6* 9.4* 9.2*  HCT 28.4* 29.5* 28.9* 28.5* 28.1*  MCV 103.6* 101.7* 102.1* 101.1* 103.3*  PLT 178 174 204 221 209    Cardiac Enzymes: Recent Labs  Lab 04/11/24 2319  CKTOTAL 232    BNP: Invalid input(s): POCBNP  CBG: No results for input(s): GLUCAP in the last 168 hours.  Microbiology: Results for orders placed or performed in visit on 05/25/23  Microscopic Examination     Status: Abnormal   Collection Time: 05/25/23  4:05 PM   Urine  Result Value Ref Range Status   WBC, UA 11-30 (A) 0 - 5 /hpf Final   RBC, Urine 3-10 (A) 0 - 2 /hpf Final   Epithelial Cells (non renal) 0-10 0 - 10 /hpf Final   Casts Present (A) None seen /lpf  Final   Cast Type Hyaline casts N/A Final   Mucus, UA Present (A) Not Estab. Final   Bacteria, UA Moderate (A) None seen/Few Final    Coagulation Studies: No results for input(s): LABPROT, INR in the last 72 hours.   Urinalysis: No results for input(s): COLORURINE, LABSPEC, PHURINE, GLUCOSEU, HGBUR, BILIRUBINUR, KETONESUR, PROTEINUR, UROBILINOGEN, NITRITE, LEUKOCYTESUR in the last 72 hours.  Invalid input(s): APPERANCEUR     Imaging: No results found.    Medications:    amiodarone  30 mg/hr (04/17/24 0811)    apixaban   5 mg Oral BID   Chlorhexidine  Gluconate Cloth  6 each Topical Q0600   docusate sodium   100 mg Oral BID   gabapentin   100 mg Oral Daily   lithium  carbonate  300 mg Oral Q supper   loratadine   10 mg Oral Daily   metoprolol  tartrate  25 mg Oral Q8H   multivitamin with minerals  1 tablet Oral Daily   primidone   25 mg Oral QHS   rosuvastatin   10 mg Oral Daily   acetaminophen , albuterol , dextromethorphan-guaiFENesin , hydrALAZINE , ondansetron  (ZOFRAN ) IV  Assessment/ Plan:  Luis Miller. is a 79 y.o.  male with end-stage renal disease on hemodialysis, hypertension, gout, depression, history of melanoma, Crohn's disease, history of stroke, bipolar disorder. 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   Receiving shortened dialysis treatment today. Will receive next treatment tomorrow to maintain outpatient schedule.   2. Anemia of chronic kidney disease Lab Results  Component Value Date   HGB 9.2 (L) 04/17/2024    Hgb 9.2, stable Will order low dose retacrit  4000 units with dialysis tomorrow.   3. Secondary Hyperparathyroidism: with  outpatient labs: PTH 235, phosphorus 6.0, calcium  10.4 on 04/13/23.   Lab Results  Component Value Date   CALCIUM  9.4 04/17/2024   CAION 1.17 11/05/2021   PHOS 6.5 (H) 04/17/2024    Hyperphosphatemia noted. Will continue to monitor improvements with dialysis. May have to consider binders  4. Fall, unwitnessed. Imaging negative for acute fracture. PT/OT recommend rehab.    LOS: 5 Luis Miller 1/27/20261:23 PM   "

## 2024-04-17 NOTE — Progress Notes (Signed)
 Patient stood at bedside with assistance but was unable to maintain stable stance.
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
## 2024-04-17 NOTE — Progress Notes (Signed)
 Patient returned from dialysis.

## 2024-04-17 NOTE — TOC Progression Note (Signed)
 Transition of Care (TOC) - Progression Note    Patient Details  Name: Luis Miller. MRN: [id number] Date of Birth: June 15, 1945  Transition of Care [facility]) CM/SW Contact  Shasta DELENA Daring, RN Phone Number: 04/17/2024, 4:38 PM  Clinical Narrative:     Met with patient. He was alone in his room, lying in bed. Had just finished speaking with cardiology. Appeared alert and oriented. Discussed SNF offers. Patient accepted offer from [facility].  Notified his son and notified [facility] rep and linked in hub.  Will follow for discharge readiness.  Expected Discharge Plan: Skilled Nursing Facility Barriers to Discharge: Continued Medical Work up               Expected Discharge Plan and Services     Post Acute Care Choice: Skilled Nursing Facility Living arrangements for the past 2 months: Single Family Home                                       Social Drivers of Health (SDOH) Interventions SDOH Screenings   Food Insecurity: No Food Insecurity (04/11/2024)  Housing: Low Risk (04/11/2024)  Transportation Needs: No Transportation Needs (04/11/2024)  Utilities: Not At Risk (04/11/2024)  Alcohol Screen: Low Risk (09/30/2022)  Depression (PHQ2-9): Low Risk (01/18/2024)  Financial Resource Strain: Low Risk  (01/20/2024)   Received from [facility]  Physical Activity: Insufficiently Active (12/07/2021)  Social Connections: Socially Isolated (04/11/2024)  Stress: No Stress Concern Present (12/07/2021)  Tobacco Use: Medium Risk (04/14/2024)    Readmission Risk Interventions     No data to display

## 2024-04-17 NOTE — Plan of Care (Signed)

## 2024-04-17 NOTE — Progress Notes (Signed)
 Cardiology at bedside.

## 2024-04-17 NOTE — Progress Notes (Signed)
 "  Rounding Note   Patient Name: Luis Miller. Date of Encounter: 04/17/2024  [facility] Cardiologist: None  Consult completed by Dr Raford  Subjective Presented after being found down at home after missing dialysis was noted to be in atrial flutter.  He has been continued on apixaban  5 mg twice daily, metoprolol  tartrate 25 mg every 8 hours and amiodarone  infusion to be started this morning.  No overnight events recorded.  Patient received dialysis today.  Denies any chest pain or shortness of breath.  Scheduled Meds:  apixaban   5 mg Oral BID   Chlorhexidine  Gluconate Cloth  6 each Topical Q0600   docusate sodium   100 mg Oral BID   [START ON 04/18/2024] epoetin  alfa-epbx (RETACRIT ) injection  4,000 Units Intravenous Q M,W,F-1800   gabapentin   100 mg Oral Daily   lithium  carbonate  300 mg Oral Q supper   loratadine   10 mg Oral Daily   metoprolol  tartrate  25 mg Oral Q8H   multivitamin with minerals  1 tablet Oral Daily   primidone   25 mg Oral QHS   rosuvastatin   10 mg Oral Daily   Continuous Infusions:  amiodarone  30 mg/hr (04/17/24 0811)   PRN Meds: acetaminophen , albuterol , dextromethorphan-guaiFENesin , hydrALAZINE , ondansetron  (ZOFRAN ) IV   Vital Signs  Vitals:   04/17/24 1215 04/17/24 1230 04/17/24 1245 04/17/24 1422  BP: (!) 122/55 (!) 122/105 111/76 122/73  Pulse: (!) 116 (!) 113  (!) 116  Resp: (!) 21 20 (!) 22 19  Temp:    99 F (37.2 C)  TempSrc:    Oral  SpO2: (!) 10% 99% 99% 100%  Weight:      Height:        Intake/Output Summary (Last 24 hours) at 04/17/2024 1621 Last data filed at 04/17/2024 1400 Gross per 24 hour  Intake 492.64 ml  Output 2450 ml  Net -1957.36 ml      04/17/2024   10:00 AM 04/17/2024    5:00 AM 04/16/2024    5:00 AM  Last 3 Weights  Weight (lbs) 190 lb 4.1 oz 190 lb 0.6 oz 187 lb 6.3 oz  Weight (kg) 86.3 kg 86.2 kg 85 kg      Telemetry Atrial fibrillation rates of 90s to 110- Personally Reviewed  ECG  No new  tracings- Personally Reviewed  Physical Exam Physical Exam Constitutional:      Appearance: He is obese.  HENT:     Head: Normocephalic and atraumatic.     Mouth/Throat:     Mouth: Mucous membranes are moist.     Pharynx: Oropharynx is clear.  Eyes:     Pupils: Pupils are equal, round, and reactive to light.  Cardiovascular:     Rate and Rhythm: Tachycardia present. Rhythm irregular.  Pulmonary:     Effort: Pulmonary effort is normal.     Comments: Diminished bases bilaterally respirations are unlabored at rest on 2 L of O2 via nasal cannula Abdominal:     General: Bowel sounds are normal.     Palpations: Abdomen is soft.  Skin:    General: Skin is warm and dry.  Neurological:     General: No focal deficit present.     Mental Status: He is alert and oriented to person, place, and time.  Psychiatric:        Mood and Affect: Mood normal.        Behavior: Behavior normal.      Labs High Sensitivity Troponin:  No results for  input(s): TROPONINIHS in the last 720 hours.  Recent Labs  Lab 04/11/24 1800 04/11/24 2319  TRNPT 114* 122*       Chemistry Recent Labs  Lab 04/15/24 0628 04/16/24 0003 04/17/24 0327  NA 137 137 137  K 3.7 3.7 3.9  CL 97* 97* 97*  CO2 25 25 23   GLUCOSE 102* 93 96  BUN 54* 68* 80*  CREATININE 6.49* 7.75* 8.79*  CALCIUM  9.9 10.1 9.4  MG 2.5* 2.6* 2.7*  PROT 6.2* 6.4* 6.0*  ALBUMIN 3.4* 3.5 3.5  AST 25 23 30   ALT 10 12 18   ALKPHOS 65 64 69  BILITOT 0.5 0.4 0.3  GFRNONAA 8* 7* 6*  ANIONGAP 16* 15 18*    Lipids No results for input(s): CHOL, TRIG, HDL, LABVLDL, LDLCALC, CHOLHDL in the last 168 hours.  Hematology Recent Labs  Lab 04/15/24 0628 04/16/24 0003 04/17/24 0327  WBC 7.1 7.1 6.3  RBC 2.83* 2.82* 2.72*  HGB 9.6* 9.4* 9.2*  HCT 28.9* 28.5* 28.1*  MCV 102.1* 101.1* 103.3*  MCH 33.9 33.3 33.8  MCHC 33.2 33.0 32.7  RDW 14.2 14.5 14.4  PLT 204 221 209   Thyroid  No results for input(s): TSH, FREET4 in the  last 168 hours.  BNPNo results for input(s): BNP, PROBNP in the last 168 hours.  DDimer No results for input(s): DDIMER in the last 168 hours.   Radiology  No results found.  Cardiac Studies 2d echo 04/13/2024 1. Left ventricular ejection fraction, by estimation, is 60 to 65%. The  left ventricle has normal function. The left ventricle has no regional  wall motion abnormalities. Left ventricular diastolic parameters are  indeterminate.   2. Right ventricular systolic function is normal. The right ventricular  size is normal. There is normal pulmonary artery systolic pressure.   3. Left atrial size was mildly dilated.   4. The mitral valve is normal in structure. No evidence of mitral valve  regurgitation. No evidence of mitral stenosis.   5. The aortic valve is normal in structure. Aortic valve regurgitation is  not visualized. No aortic stenosis is present.   6. The inferior vena cava is normal in size with greater than 50%  respiratory variability, suggesting right atrial pressure of 3 mmHg.   Patient Profile   79 y.o. male with a past medical history of end-stage renal disease on hemodialysis, CVA, junctional bradycardia, hypertension, hyperlipidemia, OSA, Crohn's disease, bipolar disorder who has been seen and evaluated for atrial flutter.  Assessment & Plan  Atrial flutter -No prior history of atrial fibrillation or atrial flutter -Continue IV heparin  infusion for CHA2DS2-VASc of at least 6 for stroke prophylaxis -Continued on metoprolol  25 mg every 8 hours - Rates continued to remain in the 1 teens 120s -Currently is continued on IV amiodarone  -If he continues to remain difficult to rate control consider TEE/DCCV prior to discharge -Hemoglobin has remained stable, IV heparin  transition to apixaban  5 mg twice daily -Continue with telemetry monitoring -TSH 0.600 - Echocardiogram revealed LVEF of 60 to 65%, no RWMA, diastolic parameters were indeterminate, no significant  valvular abnormalities were noted - Scheduled for TEE cardioversion tomorrow 04/18/2024 Informed Consent   Shared Decision Making/Informed Consent   The risks [stroke, cardiac arrhythmias rarely resulting in the need for a temporary or permanent pacemaker, skin irritation or burns, esophageal damage, perforation (1:10,000 risk), bleeding, pharyngeal hematoma as well as other potential complications associated with conscious sedation including aspiration, arrhythmia, respiratory failure and death], benefits (treatment guidance, restoration of normal  sinus rhythm, diagnostic support) and alternatives of a transesophageal echocardiogram guided cardioversion were discussed in detail with Mr. Amsler and he is willing to proceed.     Demand ischemia -High-sensitivity troponin elevated on admission, trended to 122 and 114, not consistent with ACS -Not on more consistent with demand ischemia -EKG as needed for pain or changes -No ischemic workup pending at this time  Hyperlipidemia -LDL 46 -Continue on rosuvastatin   End-stage renal disease -Currently on hemodialysis -Ongoing management per nephrology   For questions or updates, please contact [facility] Please consult [email or web address] for contact info under       Signed, Evelio Rueda, NP  04/17/2024, 4:21 PM    "

## 2024-04-17 NOTE — Discharge Instructions (Addendum)
 Information on my medicine - ELIQUIS  (apixaban )  This medication education was reviewed with me or my healthcare representative as part of my discharge preparation.  The pharmacist that spoke with me during my hospital stay was:   Why was Eliquis  prescribed for you? Eliquis  was prescribed for you to reduce the risk of a blood clot forming that can cause a stroke if you have a medical condition called atrial fibrillation (a type of irregular heartbeat).  What do You need to know about Eliquis  ? Take your Eliquis  TWICE DAILY - one tablet in the morning and one tablet in the evening with or without food. If you have difficulty swallowing the tablet whole please discuss with your pharmacist how to take the medication safely.  Take Eliquis  exactly as prescribed by your doctor and DO NOT stop taking Eliquis  without talking to the doctor who prescribed the medication.  Stopping may increase your risk of developing a stroke.  Refill your prescription before you run out.  After discharge, you should have regular check-up appointments with your healthcare provider that is prescribing your Eliquis .  In the future your dose may need to be changed if your kidney function or weight changes by a significant amount or as you get older.  What do you do if you miss a dose? If you miss a dose, take it as soon as you remember on the same day and resume taking twice daily.  Do not take more than one dose of ELIQUIS  at the same time to make up a missed dose.  Important Safety Information A possible side effect of Eliquis  is bleeding. You should call your healthcare provider right away if you experience any of the following: Bleeding from an injury or your nose that does not stop. Unusual colored urine (red or dark brown) or unusual colored stools (red or black). Unusual bruising for unknown reasons. A serious fall or if you hit your head (even if there is no bleeding).  Some medicines may interact with  Eliquis  and might increase your risk of bleeding or clotting while on Eliquis . To help avoid this, consult your healthcare provider or pharmacist prior to using any new prescription or non-prescription medications, including herbals, vitamins, non-steroidal anti-inflammatory drugs (NSAIDs) and supplements.  This website has more information on Eliquis  (apixaban ): [email or web address] .[email or web address] [email or web address]     Do you feel isolated?  [facility] offers a [employer] that anyone can call toll free at [phone]. The friendship line is available 24 hours a day  [facility] is a Program of All-inclusive Care for the Elderly (PACE). Their mission is to promote and sustain the independence of seniors wishing to remain in the community. They provide seniors with comprehensive long-term health, social, medical and dietary care. Their program is a safe alternative to nursing home care. [phone]  [facility] Address [facility] [address] [place], [place] [address] Phone: [phone]. . Online zoom yoga class, connect with others without leaving your home [facility] offers [place] dance cardio sessions for individuals via Zoom. This program provides: - Dance fitness activities Please contact program for more information. Servinganyone in need adults 18+ hiv/aids individuals families Call [phone]  Email [email or web address]@yahoo .com to get more info  [employer] offers an online [employer] to individuals where they can receive help to focus on their best health. Whether you're a [employer] member or not, the neighborhood center offers a... Main Serviceshealth education  exercise & fitness  community support  services  recreation  virtual support Other Servicessupport groups Servinganyone in need adults young adults teens seniors individuals families [email or web address] to get more info  Schedule on their  website  The [facility] offers an array of activities for adults age 33 and over. This program provides:- Fitness and health programs- Tech classes- Activity books Main Serviceshealth education  community support services  exercise & fitness  recreation  more education Servingseniors  Call [phone]    For more resources go online to [email or web address] and type in you zipcode

## 2024-04-17 NOTE — Telephone Encounter (Signed)
 Pharmacy Patient Advocate Encounter  Insurance verification completed.    The patient is insured through [employer]. Patient has Medicare and is not eligible for a copay card, but may be able to apply for patient assistance or Medicare RX Payment Plan (Patient Must reach out to their plan, if eligible for payment plan), if available.    Ran test claim for Eliquis  5mg  tablet and the current 30 day co-pay is $249.70 due to deductible.   This test claim was processed through [facility]- copay amounts may vary at other pharmacies due to pharmacy/plan contracts, or as the patient moves through the different stages of their insurance plan.

## 2024-04-17 NOTE — Progress Notes (Signed)
 OT Cancellation Note  Patient Details Name: Luis Miller. MRN: [id number] DOB: February 22, 1946   Cancelled Treatment:    Reason Eval/Treat Not Completed: Other (comment) (pt OTF for HD, OT will re attempt as able) Therisa Sheffield, OTD OTR/L  04/17/24, 9:51 AM

## 2024-04-17 NOTE — Progress Notes (Signed)
 Patient off floor to dialysis

## 2024-04-17 NOTE — Progress Notes (Signed)
 PT Cancellation Note  Patient Details Name: Luis Miller. MRN: [id number] DOB: Apr 16, 1945   Cancelled Treatment:    Reason Eval/Treat Not Completed: Patient at procedure or test/unavailable Pt currently OTF at dialysis. Will f/u as able.  Richerd Pinal, PT, DPT 04/17/24, 12:01 PM   Richerd CHRISTELLA Pinal 04/17/2024, 12:01 PM

## 2024-04-18 ENCOUNTER — Encounter
Admission: EM | Disposition: A | Discharge status: Skilled Nursing Facility | Payer: Self-pay | Source: Home / Self Care | Attending: Family Medicine

## 2024-04-18 ENCOUNTER — Encounter: Payer: Self-pay | Admitting: Internal Medicine

## 2024-04-18 ENCOUNTER — Inpatient Hospital Stay: Admit: 2024-04-18 | Discharge: 2024-04-18 | Disposition: A | Attending: Cardiology | Admitting: Cardiology

## 2024-04-18 ENCOUNTER — Inpatient Hospital Stay

## 2024-04-18 ENCOUNTER — Other Ambulatory Visit: Payer: Self-pay

## 2024-04-18 DIAGNOSIS — I10 Essential (primary) hypertension: Secondary | ICD-10-CM | POA: Diagnosis not present

## 2024-04-18 DIAGNOSIS — I420 Dilated cardiomyopathy: Secondary | ICD-10-CM

## 2024-04-18 DIAGNOSIS — R0602 Shortness of breath: Secondary | ICD-10-CM

## 2024-04-18 DIAGNOSIS — R531 Weakness: Principal | ICD-10-CM

## 2024-04-18 DIAGNOSIS — E782 Mixed hyperlipidemia: Secondary | ICD-10-CM

## 2024-04-18 DIAGNOSIS — I340 Nonrheumatic mitral (valve) insufficiency: Secondary | ICD-10-CM | POA: Diagnosis not present

## 2024-04-18 DIAGNOSIS — I5A Non-ischemic myocardial injury (non-traumatic): Secondary | ICD-10-CM | POA: Diagnosis not present

## 2024-04-18 DIAGNOSIS — Z992 Dependence on renal dialysis: Secondary | ICD-10-CM | POA: Diagnosis not present

## 2024-04-18 DIAGNOSIS — N186 End stage renal disease: Secondary | ICD-10-CM | POA: Diagnosis not present

## 2024-04-18 DIAGNOSIS — N19 Unspecified kidney failure: Secondary | ICD-10-CM | POA: Diagnosis not present

## 2024-04-18 DIAGNOSIS — I483 Typical atrial flutter: Secondary | ICD-10-CM | POA: Diagnosis not present

## 2024-04-18 LAB — COMPREHENSIVE METABOLIC PANEL WITH GFR
ALT: 16 U/L (ref 0–44)
ALT: 17 U/L (ref 0–44)
AST: 26 U/L (ref 15–41)
AST: 27 U/L (ref 15–41)
Albumin: 3.6 g/dL (ref 3.5–5.0)
Albumin: 3.5 g/dL (ref 3.5–5.0)
Alkaline Phosphatase: 63 U/L (ref 38–126)
Alkaline Phosphatase: 63 U/L (ref 38–126)
Anion gap: 14 (ref 5–15)
Anion gap: 14 (ref 5–15)
BUN: 51 mg/dL — ABNORMAL HIGH (ref 8–23)
BUN: 31 mg/dL — ABNORMAL HIGH (ref 8–23)
CO2: 27 mmol/L (ref 22–32)
CO2: 26 mmol/L (ref 22–32)
Calcium: 9.3 mg/dL (ref 8.9–10.3)
Calcium: 9 mg/dL (ref 8.9–10.3)
Chloride: 96 mmol/L — ABNORMAL LOW (ref 98–111)
Chloride: 96 mmol/L — ABNORMAL LOW (ref 98–111)
Creatinine, Ser: 6.5 mg/dL — ABNORMAL HIGH (ref 0.61–1.24)
Creatinine, Ser: 5.05 mg/dL — ABNORMAL HIGH (ref 0.61–1.24)
GFR, Estimated: 8 mL/min — ABNORMAL LOW
GFR, Estimated: 11 mL/min — ABNORMAL LOW
Glucose, Bld: 115 mg/dL — ABNORMAL HIGH (ref 70–99)
Glucose, Bld: 121 mg/dL — ABNORMAL HIGH (ref 70–99)
Potassium: 3.7 mmol/L (ref 3.5–5.1)
Potassium: 4 mmol/L (ref 3.5–5.1)
Sodium: 137 mmol/L (ref 135–145)
Sodium: 136 mmol/L (ref 135–145)
Total Bilirubin: 0.3 mg/dL (ref 0.0–1.2)
Total Bilirubin: 0.3 mg/dL (ref 0.0–1.2)
Total Protein: 6.3 g/dL — ABNORMAL LOW (ref 6.5–8.1)
Total Protein: 6.2 g/dL — ABNORMAL LOW (ref 6.5–8.1)

## 2024-04-18 LAB — MAGNESIUM
Magnesium: 2.5 mg/dL — ABNORMAL HIGH (ref 1.7–2.4)
Magnesium: 2.2 mg/dL (ref 1.7–2.4)

## 2024-04-18 LAB — CBC
HCT: 30.9 % — ABNORMAL LOW (ref 39.0–52.0)
Hemoglobin: 10 g/dL — ABNORMAL LOW (ref 13.0–17.0)
MCH: 33.9 pg (ref 26.0–34.0)
MCHC: 32.4 g/dL (ref 30.0–36.0)
MCV: 104.7 fL — ABNORMAL HIGH (ref 80.0–100.0)
Platelets: 205 10*3/uL (ref 150–400)
RBC: 2.95 MIL/uL — ABNORMAL LOW (ref 4.22–5.81)
RDW: 14.5 % (ref 11.5–15.5)
WBC: 7 10*3/uL (ref 4.0–10.5)
nRBC: 0 % (ref 0.0–0.2)

## 2024-04-18 LAB — MISC LABCORP TEST (SEND OUT): Labcorp test code: 6510

## 2024-04-18 LAB — ECHO TEE

## 2024-04-18 LAB — PHOSPHORUS: Phosphorus: 5.5 mg/dL — ABNORMAL HIGH (ref 2.5–4.6)

## 2024-04-18 LAB — GLUCOSE, CAPILLARY: Glucose-Capillary: 104 mg/dL — ABNORMAL HIGH (ref 70–99)

## 2024-04-18 MED ORDER — METOPROLOL TARTRATE 25 MG PO TABS
12.5000 mg | ORAL_TABLET | Freq: Two times a day (BID) | ORAL | Status: DC
  Administered 2024-04-19: 12.5 mg via ORAL
  Filled 2024-04-18 (×2): qty 1

## 2024-04-18 MED ORDER — EPHEDRINE SULFATE (PRESSORS) 25 MG/5ML IV SOSY
PREFILLED_SYRINGE | INTRAVENOUS | Status: DC | PRN
  Administered 2024-04-18 (×3): 10 mg via INTRAVENOUS

## 2024-04-18 MED ORDER — LIDOCAINE VISCOUS HCL 2 % MT SOLN
OROMUCOSAL | Status: AC
  Filled 2024-04-18: qty 15

## 2024-04-18 MED ORDER — PROPOFOL 10 MG/ML IV BOLUS
INTRAVENOUS | Status: DC | PRN
  Administered 2024-04-18 (×3): 50 mg via INTRAVENOUS

## 2024-04-18 MED ORDER — PHENYLEPHRINE 80 MCG/ML (10ML) SYRINGE FOR IV PUSH (FOR BLOOD PRESSURE SUPPORT)
PREFILLED_SYRINGE | INTRAVENOUS | Status: DC | PRN
  Administered 2024-04-18 (×4): 160 ug via INTRAVENOUS

## 2024-04-18 MED ORDER — EPOETIN ALFA-EPBX 4000 UNIT/ML IJ SOLN
INTRAMUSCULAR | Status: AC
  Filled 2024-04-18: qty 1

## 2024-04-18 MED ORDER — SODIUM CHLORIDE 0.9 % IV BOLUS
250.0000 mL | Freq: Once | INTRAVENOUS | Status: AC
  Administered 2024-04-18: 250 mL via INTRAVENOUS

## 2024-04-18 MED ORDER — LIDOCAINE HCL (PF) 2 % IJ SOLN
INTRAMUSCULAR | Status: DC | PRN
  Administered 2024-04-18: 10 mg via INTRADERMAL

## 2024-04-18 MED ORDER — BUTAMBEN-TETRACAINE-BENZOCAINE 2-2-14 % EX AERO
INHALATION_SPRAY | CUTANEOUS | Status: AC
  Filled 2024-04-18: qty 5

## 2024-04-18 NOTE — Progress Notes (Signed)
 Hemodialysis Note:  Received patient in bed to unit. Alert and oriented. Informed consent singed and in chart.  Treatment initiated: 0830 Treatment completed: 1214  Access used: Left AVF Access issues: None  Patient tolerated well. Transported back to room, alert without acute distress. Report given to patient's RN.  Total UF removed: 1.9 Liters Medications given: Retacrit  4000 units IV  Post HD weight: 82.8 Kg  [facility]

## 2024-04-18 NOTE — Progress Notes (Addendum)
 Patient arrived back to unit, repositioned in bed.

## 2024-04-18 NOTE — Progress Notes (Signed)
 PT Cancellation Note  Patient Details Name: Luis Miller. MRN: [id number] DOB: March 17, 1946   Cancelled Treatment:    Reason Eval/Treat Not Completed: Patient at procedure or test/unavailable Pt OTF at dialysis at this time. Will f/u as able.  Richerd Pinal, PT, DPT 04/18/24, 9:37 AM   Richerd CHRISTELLA Pinal 04/18/2024, 9:37 AM

## 2024-04-18 NOTE — Progress Notes (Signed)
 Patient back on unit from dialysis.

## 2024-04-18 NOTE — Progress Notes (Signed)
 "  Rounding Note   Patient Name: Luis Miller. Date of Encounter: 04/18/2024  [facility] Cardiologist: Dr. Rodney Nicolas  Subjective Seen on rounds during dialysis Mild confusion but alert Telemetry reviewed, remains in atrial flutter with elevated ventricular rate 110 with low blood pressure - On Eliquis  5 twice daily metoprolol  to tartrate 25 every 8 hours amiodarone  infusion   Scheduled Meds:  apixaban   5 mg Oral BID   Chlorhexidine  Gluconate Cloth  6 each Topical Q0600   docusate sodium   100 mg Oral BID   epoetin  alfa-epbx (RETACRIT ) injection  4,000 Units Intravenous Q M,W,F-1800   gabapentin   100 mg Oral Daily   lithium  carbonate  300 mg Oral Q supper   loratadine   10 mg Oral Daily   metoprolol  tartrate  25 mg Oral Q8H   multivitamin with minerals  1 tablet Oral Daily   primidone   25 mg Oral QHS   rosuvastatin   10 mg Oral Daily   Continuous Infusions:  sodium chloride  20 mL/hr at 04/17/24 2019   amiodarone  30 mg/hr (04/17/24 2019)   PRN Meds: acetaminophen , albuterol , dextromethorphan-guaiFENesin , hydrALAZINE , ondansetron  (ZOFRAN ) IV   Vital Signs  Vitals:   04/18/24 1030 04/18/24 1100 04/18/24 1130 04/18/24 1214  BP: 109/64 109/89 95/73 (!) 100/54  Pulse: (!) 109 (!) 110 (!) 110 (!) 109  Resp: 15 17 18 20   Temp:    98 F (36.7 C)  TempSrc:    Oral  SpO2: 98% 97% 98% 97%  Weight:      Height:        Intake/Output Summary (Last 24 hours) at 04/18/2024 1246 Last data filed at 04/18/2024 1214 Gross per 24 hour  Intake 361.65 ml  Output 3400 ml  Net -3038.35 ml      04/18/2024    7:46 AM 04/18/2024    5:00 AM 04/17/2024   10:00 AM  Last 3 Weights  Weight (lbs) 186 lb 11.7 oz 186 lb 8.2 oz 190 lb 4.1 oz  Weight (kg) 84.7 kg 84.6 kg 86.3 kg      Telemetry Atrial flutter ventricular rate 110- Personally Reviewed  ECG   - Personally Reviewed  Physical Exam  GEN: No acute distress.   Neck: No JVD Cardiac: Irregularly  irregular, no murmurs, rubs, or gallops.  Respiratory: Clear to auscultation bilaterally. GI: Soft, nontender, non-distended  MS: No edema; No deformity. Neuro:  Nonfocal  Psych: Alert, mild confusion  Labs High Sensitivity Troponin:  No results for input(s): TROPONINIHS in the last 720 hours.  Recent Labs  Lab 04/11/24 1800 04/11/24 2319  TRNPT 114* 122*       Chemistry Recent Labs  Lab 04/16/24 0003 04/17/24 0327 04/18/24 0535  NA 137 137 137  K 3.7 3.9 3.7  CL 97* 97* 96*  CO2 25 23 27   GLUCOSE 93 96 115*  BUN 68* 80* 51*  CREATININE 7.75* 8.79* 6.50*  CALCIUM  10.1 9.4 9.3  MG 2.6* 2.7* 2.5*  PROT 6.4* 6.0* 6.3*  ALBUMIN 3.5 3.5 3.6  AST 23 30 26   ALT 12 18 16   ALKPHOS 64 69 63  BILITOT 0.4 0.3 0.3  GFRNONAA 7* 6* 8*  ANIONGAP 15 18* 14    Lipids No results for input(s): CHOL, TRIG, HDL, LABVLDL, LDLCALC, CHOLHDL in the last 168 hours.  Hematology Recent Labs  Lab 04/16/24 0003 04/17/24 0327 04/18/24 0535  WBC 7.1 6.3 7.0  RBC 2.82* 2.72* 2.95*  HGB 9.4* 9.2* 10.0*  HCT 28.5* 28.1*  30.9*  MCV 101.1* 103.3* 104.7*  MCH 33.3 33.8 33.9  MCHC 33.0 32.7 32.4  RDW 14.5 14.4 14.5  PLT 221 209 205   Thyroid  No results for input(s): TSH, FREET4 in the last 168 hours.  BNPNo results for input(s): BNP, PROBNP in the last 168 hours.  DDimer No results for input(s): DDIMER in the last 168 hours.   Radiology  No results found.  Cardiac Studies   Patient Profile   79 y.o. male   Assessment & Plan    Atrial flutter New onset atrial flutter this admission Was started on heparin  infusion Difficult to control rates on metoprolol , amiodarone  infusion was started - Plan for TEE cardioversion later today - Procedure was discussed with son in detail, he is POA and providing consent to move forward with TEE cardioversion  Demand ischemia -High-sensitivity troponin mildly elevated nontrending, not consistent with ACS -No ischemic  workup pending at this time   Hyperlipidemia -LDL 46, on rosuvastatin    End-stage renal disease Receives dialysis Monday Wednesday Friday, missed several HD treatments presenting to ER April 11, 2024 -Was found on the ground reported right hand and hip pain, lethargy/confusion -Ongoing management per nephrology     For questions or updates, please contact [facility] Please consult [email or web address] for contact info under     Signed, Kanika Bungert, MD  04/18/2024, 12:46 PM    "

## 2024-04-18 NOTE — Anesthesia Postprocedure Evaluation (Signed)
"   Anesthesia Post Note  Patient: Luis Miller.  Procedure(s) Performed: ECHOCARDIOGRAM, TRANSESOPHAGEAL CARDIOVERSION  Patient location during evaluation: Specials Recovery Anesthesia Type: General Level of consciousness: awake and alert Pain management: pain level controlled Vital Signs Assessment: post-procedure vital signs reviewed and stable Respiratory status: spontaneous breathing, nonlabored ventilation, respiratory function stable and patient connected to nasal cannula oxygen Cardiovascular status: blood pressure returned to baseline and stable Postop Assessment: no apparent nausea or vomiting Anesthetic complications: no   There were no known notable events for this encounter.   Last Vitals:  Vitals:   04/18/24 1910 04/18/24 2007  BP: 94/72 (!) 90/56  Pulse: 60   Resp: 17 (!) 26  Temp: 36.8 C   SpO2:      Last Pain:  Vitals:   04/18/24 1910  TempSrc: Axillary  PainSc: 0-No pain                 Prentice Murphy      "

## 2024-04-18 NOTE — Progress Notes (Signed)
 Transesophageal Echocardiogram :  Indication: atrial flutter Requesting/ordering  physician: Dr. Jens  Procedure: Benzocaine  spray x2 and 2 mls x 2 of viscous lidocaine  were given orally to provide local anesthesia to the oropharynx. The patient was positioned supine on the left side, bite block provided. The patient was moderately sedated with the doses of versed  and fentanyl  as detailed below.  Using digital technique an omniplane probe was advanced into the distal esophagus without incident.   Moderate sedation: 1. Sedation used:  per anesthesia  See report in EPIC  for complete details: In brief, transgastric imaging revealed mildly depressed LV function with mild global hypokinesis and no mural apical thrombus.  Estimated ejection fraction was 45%. Right sided cardiac chambers with mildly depressed function with no evidence of pulmonary hypertension.  Imaging of the septum showed no ASD or VSD Bubble study was negative for shunt 2D and color flow confirmed no PFO  The LA was well visualized in orthogonal views.  There was no spontaneous contrast and no thrombus in the LA and LA appendage   The descending thoracic aorta had no  mural aortic debris with no evidence of aneurysmal dilation or dissection  Cardioversion to follow   Luis Miller 04/18/2024 3:07 PM

## 2024-04-18 NOTE — Plan of Care (Signed)

## 2024-04-18 NOTE — CV Procedure (Signed)
 Cardioversion procedure note For atrial flutter, typical  Procedure Details:  Consent: Risks of procedure as well as the alternatives and risks of each were explained to the (patient/caregiver).  Consent for procedure obtained.  Time Out: Verified patient identification, verified procedure, site/side was marked, verified correct patient position, special equipment/implants available, medications/allergies/relevent history reviewed, required imaging and test results available.  Performed  Patient placed on cardiac monitor, pulse oximetry, supplemental oxygen as necessary.   Sedation given: propofol  IV, Dr. Dario Pacer pads placed anterior and posterior chest.   Cardioverted 1 time(s).   Cardioverted at  175 J. Synchronized biphasic Converted to junctional rhythm   Evaluation: Findings: Post procedure EKG shows: junctional rhythm Complications: none Patient did tolerate procedure well. Amiodarone  infusion stopped  Time Spent Directly with the Patient:  45 minutes   Velinda Lunger, M.D., Ph.D.

## 2024-04-18 NOTE — Progress Notes (Signed)
Patient off unit to specials.

## 2024-04-18 NOTE — Progress Notes (Signed)
 " [facility]  ROUNDING NOTE   Subjective:   Luis Miller. is a 79 year old male with end-stage renal disease on hemodialysis, hypertension, gout, depression, history of melanoma, Crohn's disease, history of stroke, bipolar disorder. 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 SABRA Update:  Patient seen and evaluated during dialysis   HEMODIALYSIS FLOWSHEET:  Blood Flow Rate (mL/min): 280 mL/min Arterial Pressure (mmHg): -125.04 mmHg Venous Pressure (mmHg): 244.23 mmHg TMP (mmHg): 26.26 mmHg Ultrafiltration Rate (mL/min): 1279 mL/min Dialysate Flow Rate (mL/min): 299 ml/min Dialysis Fluid Bolus: Normal Saline Bolus Amount (mL): 200 mL (Patient is under edw. Patient received saline bolus for elevate heart rate)  Tolerating treatment well Mild arm soreness due to difficult site assess with treatment.   Objective:  Vital signs in last 24 hours:  Temp:  [98 F (36.7 C)-99 F (37.2 C)] 98 F (36.7 C) (01/28 1214) Pulse Rate:  [100-116] 109 (01/28 1214) Resp:  [11-22] 20 (01/28 1214) BP: (95-140)/(54-89) 100/54 (01/28 1214) SpO2:  [96 %-100 %] 97 % (01/28 1214) Weight:  [84.6 kg-84.7 kg] 84.7 kg (01/28 0746)  Weight change: 0.1 kg Filed Weights   04/17/24 1000 04/18/24 0500 04/18/24 0746  Weight: 86.3 kg 84.6 kg 84.7 kg    Intake/Output: I/O last 3 completed shifts: In: 854.3 [I.V.:854.3] Out: 2450 [Urine:950; Other:1500]   Intake/Output this shift:  No intake/output data recorded.  Physical Exam: General: NAD, resting comfortably, flat affect  Head: Normocephalic  Eyes: Anicteric  Lungs:  Clear to  auscultation, O2 [place]  Heart: Irregular  Abdomen:  Soft, nontender  Extremities:  Trace peripheral edema.  Neurologic: Awake, alert  Skin: Warm,dry, no rash  Access: Left AVF    Basic Metabolic Panel: Recent Labs  Lab 04/14/24 0542 04/14/24 1009 04/15/24 0628 04/16/24 0003 04/17/24 0327 04/18/24 0535  NA 137  --  137 137 137 137  K 3.7  --  3.7 3.7 3.9 3.7  CL 96*  --  97* 97* 97* 96*  CO2 26  --  25 25 23 27   GLUCOSE 103*  --  102* 93 96 115*  BUN 48*  --  54* 68* 80* 51*  CREATININE 6.30*  --  6.49* 7.75* 8.79* 6.50*  CALCIUM  9.6  --  9.9 10.1 9.4 9.3  MG  --  2.6* 2.5* 2.6* 2.7* 2.5*  PHOS  --  5.3* 5.5* 6.3* 6.5* 5.5*    Liver Function Tests: Recent Labs  Lab 04/12/24 1302 04/13/24 0812 04/15/24 0628 04/16/24 0003 04/17/24 0327 04/18/24 0535  AST 25  --  25 23 30 26   ALT 7  --  10 12 18 16   ALKPHOS 75  --  65 64 69 63  BILITOT 1.0  --  0.5 0.4 0.3 0.3  PROT 7.0  --  6.2* 6.4* 6.0* 6.3*  ALBUMIN 3.9 3.5 3.4* 3.5 3.5 3.6   No results for input(s): LIPASE, AMYLASE in the last 168 hours. No results for input(s): AMMONIA in the last 168 hours.  CBC: Recent Labs  Lab 04/14/24 0542 04/15/24 0628 04/16/24 0003 04/17/24 0327 04/18/24 0535  WBC 6.6 7.1 7.1 6.3 7.0  HGB 10.0* 9.6* 9.4* 9.2* 10.0*  HCT 29.5* 28.9*  28.5* 28.1* 30.9*  MCV 101.7* 102.1* 101.1* 103.3* 104.7*  PLT 174 204 221 209 205    Cardiac Enzymes: Recent Labs  Lab 04/11/24 2319  CKTOTAL 232    BNP: Invalid input(s): POCBNP  CBG: No results for input(s): GLUCAP in the last 168 hours.  Microbiology: Results for orders placed or performed in visit on 05/25/23  Microscopic Examination     Status: Abnormal   Collection Time: 05/25/23  4:05 PM   Urine  Result Value Ref Range Status   WBC, UA 11-30 (A) 0 - 5 /hpf Final   RBC, Urine 3-10 (A) 0 - 2 /hpf Final   Epithelial Cells (non renal) 0-10 0 - 10 /hpf Final   Casts Present (A) None seen /lpf Final   Cast Type Hyaline  casts N/A Final   Mucus, UA Present (A) Not Estab. Final   Bacteria, UA Moderate (A) None seen/Few Final    Coagulation Studies: No results for input(s): LABPROT, INR in the last 72 hours.   Urinalysis: No results for input(s): COLORURINE, LABSPEC, PHURINE, GLUCOSEU, HGBUR, BILIRUBINUR, KETONESUR, PROTEINUR, UROBILINOGEN, NITRITE, LEUKOCYTESUR in the last 72 hours.  Invalid input(s): APPERANCEUR     Imaging: No results found.    Medications:    sodium chloride  20 mL/hr at 04/17/24 2019   amiodarone  30 mg/hr (04/17/24 2019)    apixaban   5 mg Oral BID   Chlorhexidine  Gluconate Cloth  6 each Topical Q0600   docusate sodium   100 mg Oral BID   epoetin  alfa-epbx (RETACRIT ) injection  4,000 Units Intravenous Q M,W,F-1800   gabapentin   100 mg Oral Daily   lithium  carbonate  300 mg Oral Q supper   loratadine   10 mg Oral Daily   metoprolol  tartrate  25 mg Oral Q8H   multivitamin with minerals  1 tablet Oral Daily   primidone   25 mg Oral QHS   rosuvastatin   10 mg Oral Daily   acetaminophen , albuterol , dextromethorphan-guaiFENesin , hydrALAZINE , ondansetron  (ZOFRAN ) IV  Assessment/ Plan:  Luis Miller. is a 79 y.o.  male with end-stage renal disease on hemodialysis, hypertension, gout, depression, history of melanoma, Crohn's disease, history of stroke, bipolar disorder. 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   Receiving dialysis today to maintain outpatient schedule. Next treatment scheduled for Friday.   2. Anemia of chronic kidney disease Lab Results  Component Value Date   HGB 10.0 (L) 04/18/2024    Hgb 10.0. Continue low dose retacrit  4000  units with dialysis   3. Secondary Hyperparathyroidism: with outpatient labs: PTH 235, phosphorus 6.0, calcium  10.4 on 04/13/23.   Lab Results  Component Value Date   CALCIUM  9.3 04/18/2024   CAION 1.17 11/05/2021   PHOS 5.5 (H) 04/18/2024    Hyperphosphatemia noted. Slowly correcting with dialysis.   4. Fall, unwitnessed. Imaging negative for acute fracture. PT/OT recommend rehab.    LOS: 6 Luis Miller 1/28/202612:35 PM   "

## 2024-04-18 NOTE — Progress Notes (Addendum)
 OT Cancellation Note  Patient Details Name: Luis Miller. MRN: [id number] DOB: 1945/12/01   Cancelled Treatment:    Reason Eval/Treat Not Completed: Other (comment) (pt is OTF for HD, OT will re attempt as able)  Addendum 15:20 checked back in on patient for OT tx attempt  and per cardiology he was on his way to procedure. OT will reattempt as able.   Therisa Sheffield, OTD OTR/L  04/18/24, 8:54 AM

## 2024-04-18 NOTE — Progress Notes (Signed)
 "    Progress Note    Luis Miller.  FMW:[id number] DOB: [birth year]  DOA: 04/11/2024 PCP: Lateef, Munsoor, MD      Brief Narrative:    Medical records reviewed and are as summarized below:  Luis Miller. is a 79 y.o. male with ESRD on HD MWF, hypertension, hyperlipidemia, junctional bradycardia, stroke, gout, memory loss, bipolar depression, BPH, OSA, anemia, thrombocytopenia, Crohn's disease, ulcerative colitis, who was brought to the hospital after he was found on the floor after a wellness check was done at his home.  He was supposed to have dialysis on that day but he did not show up to a wellness check was done at his home. He complained of right hip pain and right flank pain.      Assessment/Plan:   Principal Problem:   Uremia Active Problems:   Junctional bradycardia   Hyperkalemia   Hypercalcemia   Hypermagnesemia   Fall at home, initial encounter   Myocardial injury   HTN (hypertension)   Hyperlipidemia   ESRD on hemodialysis (HCC)   Thrombocytopenia   Anemia in ESRD (end-stage renal disease) (HCC)   Bipolar depression (HCC)   Obstructive sleep apnea   Memory loss   Overweight (BMI 25.0-29.9)   QT prolongation   Atrial flutter (HCC)   Weakness     Body mass index is 30.14 kg/m.   ESRD with uremia, hyperkalemia: Uremia and hyperkalemia improved after hemodialysis. Dialysis renal site has been difficult to access.  Nephrologist.  Had recent fistulogram without intervention required.   Acute metabolic encephalopathy: He had waxing and waning mental status.  Mental status has improved.    Atrial fibrillation/atrial flutter with RVR: Continue IV amiodarone  infusion.  Continue metoprolol  and Eliquis . Plan for TEE and cardioversion today.  Follow-up with cardiologist.   Elevated troponin: This is likely due to demand ischemia.   S/p fall at home: PT and OT recommended discharge to SNF.   Bipolar disorder, depression: Continue  lithium . Lithium  was initially supratherapeutic at 1.56 on 04/11/2024. Repeat lithium  on 04/14/2024 was subtherapeutic at 0.35.   Comorbidities include hypertension, hyperlipidemia, chronic thrombocytopenia OSA on CPAP at night, chronic anemia, history of junctional bradycardia     Diet Order             Diet NPO time specified Except for: Sips with Meds  Diet effective midnight                                  Consultants: Cardiologist Nephrologist  Procedures: Plan for TEE and cardioversion    Medications:    apixaban   5 mg Oral BID   Chlorhexidine  Gluconate Cloth  6 each Topical Q0600   docusate sodium   100 mg Oral BID   epoetin  alfa-epbx (RETACRIT ) injection  4,000 Units Intravenous Q M,W,F-1800   gabapentin   100 mg Oral Daily   lithium  carbonate  300 mg Oral Q supper   loratadine   10 mg Oral Daily   metoprolol  tartrate  25 mg Oral Q8H   multivitamin with minerals  1 tablet Oral Daily   primidone   25 mg Oral QHS   rosuvastatin   10 mg Oral Daily   Continuous Infusions:  sodium chloride  20 mL/hr at 04/17/24 2019   amiodarone  30 mg/hr (04/17/24 2019)     Anti-infectives (From admission, onward)    None  Family Communication/Anticipated D/C date and plan/Code Status   DVT prophylaxis:  apixaban  (ELIQUIS ) tablet 5 mg     Code Status: Full Code  Family Communication: None Disposition Plan: Plan to discharge to SNF   Status is: Inpatient Remains inpatient appropriate because: Uremia, A-fib with RVR       Subjective:   Interval events noted.  He said they had problems accessing his dialysis access with the needle at hemodialysis unit.  No other complaints.  No shortness of breath, chest pain or palpitations.   Rosina, RN at the bedside  Objective:    Vitals:   04/18/24 1100 04/18/24 1130 04/18/24 1214 04/18/24 1253  BP: 109/89 95/73 (!) 100/54 121/78  Pulse: (!) 110 (!) 110 (!) 109 (!) 108  Resp: 17 18  20 16   Temp:   98 F (36.7 C) 98 F (36.7 C)  TempSrc:   Oral   SpO2: 97% 98% 97% 100%  Weight:      Height:       No data found.   Intake/Output Summary (Last 24 hours) at 04/18/2024 1315 Last data filed at 04/18/2024 1214 Gross per 24 hour  Intake 361.65 ml  Output 3400 ml  Net -3038.35 ml   Filed Weights   04/17/24 1000 04/18/24 0500 04/18/24 0746  Weight: 86.3 kg 84.6 kg 84.7 kg    Exam:  GEN: NAD SKIN: Warm and dry EYES: No pallor or icterus ENT: MMM CV: Irregular rate and rhythm, tachycardic PULM: CTA B ABD: soft, ND, NT, +BS CNS: AAO x 3, non focal EXT: No edema or tenderness        Data Reviewed:   I have personally reviewed following labs and imaging studies:  Labs: Labs show the following:   Basic Metabolic Panel: Recent Labs  Lab 04/14/24 0542 04/14/24 1009 04/15/24 0628 04/16/24 0003 04/17/24 0327 04/18/24 0535  NA 137  --  137 137 137 137  K 3.7  --  3.7 3.7 3.9 3.7  CL 96*  --  97* 97* 97* 96*  CO2 26  --  25 25 23 27   GLUCOSE 103*  --  102* 93 96 115*  BUN 48*  --  54* 68* 80* 51*  CREATININE 6.30*  --  6.49* 7.75* 8.79* 6.50*  CALCIUM  9.6  --  9.9 10.1 9.4 9.3  MG  --  2.6* 2.5* 2.6* 2.7* 2.5*  PHOS  --  5.3* 5.5* 6.3* 6.5* 5.5*   GFR Estimated Creatinine Clearance: 9.4 mL/min (A) (by C-G formula based on SCr of 6.5 mg/dL (H)). Liver Function Tests: Recent Labs  Lab 04/12/24 1302 04/13/24 0812 04/15/24 0628 04/16/24 0003 04/17/24 0327 04/18/24 0535  AST 25  --  25 23 30 26   ALT 7  --  10 12 18 16   ALKPHOS 75  --  65 64 69 63  BILITOT 1.0  --  0.5 0.4 0.3 0.3  PROT 7.0  --  6.2* 6.4* 6.0* 6.3*  ALBUMIN 3.9 3.5 3.4* 3.5 3.5 3.6   No results for input(s): LIPASE, AMYLASE in the last 168 hours. No results for input(s): AMMONIA in the last 168 hours. Coagulation profile Recent Labs  Lab 04/14/24 1009  INR 1.2    CBC: Recent Labs  Lab 04/14/24 0542 04/15/24 0628 04/16/24 0003 04/17/24 0327  04/18/24 0535  WBC 6.6 7.1 7.1 6.3 7.0  HGB 10.0* 9.6* 9.4* 9.2* 10.0*  HCT 29.5* 28.9* 28.5* 28.1* 30.9*  MCV 101.7* 102.1* 101.1* 103.3* 104.7*  PLT 174  204 221 209 205   Cardiac Enzymes: Recent Labs  Lab 04/11/24 2319  CKTOTAL 232   BNP (last 3 results) No results for input(s): PROBNP in the last 8760 hours. CBG: Recent Labs  Lab 04/18/24 1251  GLUCAP 104*   D-Dimer: No results for input(s): DDIMER in the last 72 hours. Hgb A1c: No results for input(s): HGBA1C in the last 72 hours. Lipid Profile: No results for input(s): CHOL, HDL, LDLCALC, TRIG, CHOLHDL, LDLDIRECT in the last 72 hours. Thyroid  function studies: No results for input(s): TSH, T4TOTAL, T3FREE, THYROIDAB in the last 72 hours.  Invalid input(s): FREET3 Anemia work up: No results for input(s): VITAMINB12, FOLATE, FERRITIN, TIBC, IRON, RETICCTPCT in the last 72 hours. Sepsis Labs: Recent Labs  Lab 04/14/24 0542 04/15/24 0628 04/16/24 0003 04/17/24 0327 04/18/24 0535  WBC 6.6 7.1 7.1 6.3 7.0  LATICACIDVEN 1.1  --   --   --   --     Microbiology No results found for this or any previous visit (from the past 240 hours).  Procedures and diagnostic studies:  No results found.             LOS: 6 days   Kashlyn Salinas  [facility]   Pager on [email or web address]. If 7PM-7AM, please contact night-coverage at [email or web address]     04/18/2024, 1:15 PM           "

## 2024-04-18 NOTE — Transfer of Care (Signed)
 Immediate Anesthesia Transfer of Care Note  Patient: Catcher Dehoyos.  Procedure(s) Performed: ECHOCARDIOGRAM, TRANSESOPHAGEAL CARDIOVERSION  Patient Location: Short Stay  Anesthesia Type:General  Level of Consciousness: drowsy  Airway & Oxygen Therapy: Patient Spontanous Breathing and Patient connected to nasal cannula oxygen  Post-op Assessment: Report given to RN and Post -op Vital signs reviewed and stable  Post vital signs: Reviewed and stable  Last Vitals:  Vitals Value Taken Time  BP    Temp    Pulse    Resp    SpO2      Last Pain:  Vitals:   04/18/24 1411  TempSrc: Oral  PainSc: 0-No pain      Patients Stated Pain Goal: 0 (04/16/24 0457)  Complications: There were no known notable events for this encounter.

## 2024-04-18 NOTE — Progress Notes (Signed)
*  PRELIMINARY RESULTS* Echocardiogram Echocardiogram Transesophageal has been performed.  Floydene Harder 04/18/2024, 3:06 PM

## 2024-04-18 NOTE — Progress Notes (Signed)
 Patient off floor to dialysis
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
## 2024-04-19 ENCOUNTER — Encounter: Payer: Self-pay | Admitting: Cardiovascular Disease

## 2024-04-19 ENCOUNTER — Telehealth: Payer: Self-pay | Admitting: Psychiatry

## 2024-04-19 DIAGNOSIS — R001 Bradycardia, unspecified: Secondary | ICD-10-CM | POA: Diagnosis not present

## 2024-04-19 DIAGNOSIS — N19 Unspecified kidney failure: Secondary | ICD-10-CM | POA: Diagnosis not present

## 2024-04-19 DIAGNOSIS — Z992 Dependence on renal dialysis: Secondary | ICD-10-CM | POA: Diagnosis not present

## 2024-04-19 DIAGNOSIS — I483 Typical atrial flutter: Secondary | ICD-10-CM | POA: Diagnosis not present

## 2024-04-19 DIAGNOSIS — N186 End stage renal disease: Secondary | ICD-10-CM | POA: Diagnosis not present

## 2024-04-19 DIAGNOSIS — E782 Mixed hyperlipidemia: Secondary | ICD-10-CM | POA: Diagnosis not present

## 2024-04-19 LAB — TSH: TSH: 2.43 u[IU]/mL (ref 0.350–4.500)

## 2024-04-19 MED ORDER — DIPHENHYDRAMINE HCL 25 MG PO CAPS
25.0000 mg | ORAL_CAPSULE | Freq: Once | ORAL | Status: AC
  Administered 2024-04-19: 25 mg via ORAL
  Filled 2024-04-19: qty 1

## 2024-04-19 MED ORDER — HYDROCERIN EX CREA
1.0000 | TOPICAL_CREAM | Freq: Two times a day (BID) | CUTANEOUS | Status: DC
  Administered 2024-04-19: 1 via TOPICAL
  Filled 2024-04-19: qty 113

## 2024-04-19 NOTE — Progress Notes (Signed)
 " [facility]  ROUNDING NOTE   Subjective:   Luis Miller. is a 79 year old male with end-stage renal disease on hemodialysis, hypertension, gout, depression, history of melanoma, Crohn's disease, history of stroke, bipolar disorder. 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 SABRA Update:   Patient sitting up in bed Alert and oriented Denies pain Room air  Objective:  Vital signs in last 24 hours:  Temp:  [97.2 F (36.2 C)-98.3 F (36.8 C)] 98.2 F (36.8 C) (01/29 1144) Pulse Rate:  [47-62] 62 (01/29 1144) Resp:  [16-26] 20 (01/29 1144) BP: (73-133)/(31-85) 120/60 (01/29 1144) SpO2:  [95 %-100 %] 95 % (01/29 1144) Weight:  [82 kg] 82 kg (01/29 0500)  Weight change: -1.6 kg Filed Weights   04/18/24 0746 04/18/24 1214 04/19/24 0500  Weight: 84.7 kg 82.8 kg 82 kg    Intake/Output: I/O last 3 completed shifts: In: 661.7 [I.V.:661.7] Out: 2150 [Urine:250; Other:1900]   Intake/Output this shift:  No intake/output data recorded.  Physical Exam: General: NAD, resting comfortably, flat affect  Head: Normocephalic  Eyes: Anicteric  Lungs:  Clear to auscultation, O2 [place]  Heart: Irregular  Abdomen:  Soft, nontender  Extremities:  Trace peripheral edema.  Neurologic: Awake, alert  Skin: Warm,dry, no rash  Access: Left AVF    Basic Metabolic Panel: Recent Labs  Lab 04/14/24 1009 04/15/24 0628 04/16/24 0003 04/17/24 0327 04/18/24 0535 04/18/24 2244  NA  --  137 137 137 137 136  K  --  3.7 3.7 3.9 3.7 4.0  CL  --  97* 97* 97* 96* 96*  CO2  --  25 25 23 27 26   GLUCOSE  --  102* 93 96 115* 121*  BUN  --   54* 68* 80* 51* 31*  CREATININE  --  6.49* 7.75* 8.79* 6.50* 5.05*  CALCIUM   --  9.9 10.1 9.4 9.3 9.0  MG 2.6* 2.5* 2.6* 2.7* 2.5* 2.2  PHOS 5.3* 5.5* 6.3* 6.5* 5.5*  --     Liver Function Tests: Recent Labs  Lab 04/15/24 0628 04/16/24 0003 04/17/24 0327 04/18/24 0535 04/18/24 2244  AST 25 23 30 26 27   ALT 10 12 18 16 17   ALKPHOS 65 64 69 63 63  BILITOT 0.5 0.4 0.3 0.3 0.3  PROT 6.2* 6.4* 6.0* 6.3* 6.2*  ALBUMIN 3.4* 3.5 3.5 3.6 3.5   No results for input(s): LIPASE, AMYLASE in the last 168 hours. No results for input(s): AMMONIA in the last 168 hours.  CBC: Recent Labs  Lab 04/14/24 0542 04/15/24 0628 04/16/24 0003 04/17/24 0327 04/18/24 0535  WBC 6.6 7.1 7.1 6.3 7.0  HGB 10.0* 9.6* 9.4* 9.2* 10.0*  HCT 29.5* 28.9* 28.5* 28.1* 30.9*  MCV 101.7* 102.1* 101.1* 103.3* 104.7*  PLT 174 204 221 209 205    Cardiac Enzymes: No results for input(s): CKTOTAL, CKMB, CKMBINDEX, TROPONINI in the last 168 hours.   BNP: Invalid input(s): POCBNP  CBG: Recent Labs  Lab 04/18/24 1251  GLUCAP 104*    Microbiology: Results for orders placed or performed in visit on 05/25/23  Microscopic Examination     Status: Abnormal   Collection Time: 05/25/23  4:05 PM   Urine  Result Value Ref Range Status   WBC, UA 11-30 (A) 0 - 5 /hpf Final   RBC, Urine 3-10 (A) 0 - 2 /hpf Final   Epithelial Cells (non renal) 0-10 0 - 10 /hpf Final   Casts Present (A) None seen /lpf Final   Cast Type Hyaline casts N/A Final   Mucus, UA Present (A) Not Estab. Final   Bacteria, UA Moderate (A) None seen/Few Final    Coagulation Studies: No results for input(s): LABPROT, INR in the last 72 hours.   Urinalysis: No results for input(s): COLORURINE, LABSPEC, PHURINE, GLUCOSEU, HGBUR, BILIRUBINUR, KETONESUR, PROTEINUR, UROBILINOGEN, NITRITE, LEUKOCYTESUR in the last 72 hours.  Invalid input(s): APPERANCEUR     Imaging: ECHO TEE Result Date:  04/18/2024    TRANSESOPHOGEAL ECHO REPORT   Patient Name:   Luis Miller. Date of Exam: 04/18/2024 Medical Rec #:  [id number]          Height:       66.0 in Accession #:    [id number]         Weight:       186.7 lb Date of Birth:  10-25-1945          BSA:          1.942 m Patient Age:    78 years           BP:           107/84 mmHg Patient Gender: M                  HR:           110 bpm. Exam Location:  [facility] Procedure: Transesophageal Echo, Cardiac Doppler, Color Doppler and Saline            Contrast Bubble Study (Both Spectral and Color Flow Doppler were            utilized during procedure). Indications:     Atrial Flutter  History:         Patient has prior history of Echocardiogram examinations, most                  recent 04/14/2024. Risk Factors:Hypertension and Dyslipidemia.  Sonographer:     Christopher Furnace Referring Phys:  [id number] SHERI HAMMOCK Diagnosing Phys: Evalene Lunger MD PROCEDURE: After discussion of the risks and benefits of a TEE, an informed consent was obtained from the patient. TEE procedure time was 30 minutes. The transesophogeal probe was passed without difficulty through the esophogus of the patient. Imaged were obtained with the patient in a left lateral decubitus position. Local oropharyngeal anesthetic was provided with Cetacaine  and viscous lidocaine . Sedation performed by different physician. Image quality was excellent. The patient's vital signs; including heart rate, blood pressure, and oxygen saturation; remained stable throughout the procedure. The patient developed no complications during the procedure. A successful direct current cardioversion was performed at 200 joules with 1 attempt.  IMPRESSIONS  1. Left ventricular ejection fraction, by estimation, is 40 to 45%. The left ventricle has mildly decreased function. The left ventricle demonstrates global hypokinesis.  2. Right ventricular systolic function is mildly reduced. The right ventricular size is normal.  3. No left  atrial/left atrial appendage thrombus was detected.  4. The mitral valve is normal in structure. Mild mitral valve regurgitation. No evidence of mitral stenosis.  5. The aortic valve is normal in structure. Aortic valve regurgitation is not visualized. No aortic stenosis is present.  6. The inferior  vena cava is normal in size with greater than 50% respiratory variability, suggesting right atrial pressure of 3 mmHg.  7. Agitated saline contrast bubble study was negative, with no evidence of any interatrial shunt.  8. Cardioversion to follow Conclusion(s)/Recommendation(s): FINDINGS  Left Ventricle: Left ventricular ejection fraction, by estimation, is 40 to 45%. The left ventricle has mildly decreased function. The left ventricle demonstrates global hypokinesis. The left ventricular internal cavity size was normal in size. There is  no left ventricular hypertrophy. Right Ventricle: The right ventricular size is normal. No increase in right ventricular wall thickness. Right ventricular systolic function is mildly reduced. Left Atrium: Left atrial size was normal in size. No left atrial/left atrial appendage thrombus was detected. Right Atrium: Right atrial size was normal in size. Pericardium: There is no evidence of pericardial effusion. Mitral Valve: The mitral valve is normal in structure. Mild mitral valve regurgitation. No evidence of mitral valve stenosis. Tricuspid Valve: The tricuspid valve is normal in structure. Tricuspid valve regurgitation is not demonstrated. No evidence of tricuspid stenosis. Aortic Valve: The aortic valve is normal in structure. Aortic valve regurgitation is not visualized. No aortic stenosis is present. Pulmonic Valve: The pulmonic valve was normal in structure. Pulmonic valve regurgitation is not visualized. No evidence of pulmonic stenosis. Aorta: The aortic root is normal in size and structure. Venous: The inferior vena cava is normal in size with greater than 50% respiratory  variability, suggesting right atrial pressure of 3 mmHg. IAS/Shunts: No atrial level shunt detected by color flow Doppler. Agitated saline contrast was given intravenously to evaluate for intracardiac shunting. Agitated saline contrast bubble study was negative, with no evidence of any interatrial shunt. There  is no evidence of a patent foramen ovale. There is no evidence of an atrial septal defect. Additional Comments: 3D was performed not requiring image post processing on an independent workstation and was indeterminate. Timothy Gollan MD Electronically signed by Timothy Gollan MD Signature Date/Time: 04/18/2024/4:23:54 PM    Final       Medications:      apixaban   5 mg Oral BID   Chlorhexidine  Gluconate Cloth  6 each Topical Q0600   docusate sodium   100 mg Oral BID   epoetin  alfa-epbx (RETACRIT ) injection  4,000 Units Intravenous Q M,W,F-1800   gabapentin   100 mg Oral Daily   lithium  carbonate  300 mg Oral Q supper   loratadine   10 mg Oral Daily   metoprolol  tartrate  12.5 mg Oral BID   multivitamin with minerals  1 tablet Oral Daily   primidone   25 mg Oral QHS   rosuvastatin   10 mg Oral Daily   acetaminophen , albuterol , dextromethorphan-guaiFENesin , hydrALAZINE , ondansetron  (ZOFRAN ) IV  Assessment/ Plan:  Mr. Shermar Friedland. is a 79 y.o.  male with end-stage renal disease on hemodialysis, hypertension, gout, depression, history of melanoma, Crohn's disease, history of stroke, bipolar disorder. 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   Tolerated dialysis well yesterday, UF 1.9L achieved.  Next treatment scheduled for Friday.   2. Anemia of chronic kidney disease Lab Results  Component  Value Date   HGB 10.0 (L) 04/18/2024    Hgb 10.0. Continue low dose retacrit  4000 units with dialysis   3. Secondary Hyperparathyroidism: with outpatient labs:  PTH 235, phosphorus 6.0, calcium  10.4 on 04/13/23.   Lab Results  Component Value Date   CALCIUM  9.0 04/18/2024   CAION 1.17 11/05/2021   PHOS 5.5 (H) 04/18/2024    Hyperphosphatemia noted. Will continue to monitor with dialysis.   4. Fall, unwitnessed. Imaging negative for acute fracture. PT/OT recommend rehab.    LOS: 7 Onofrio Klemp 1/29/20263:18 PM   "

## 2024-04-19 NOTE — Evaluation (Signed)
 Physical Therapy Re-Evaluation Patient Details Name: Siddharth Babington. MRN: [id number] DOB: 1946-03-09 Today's Date: 04/19/2024  History of Present Illness  Pt is a 79 year old male found down outside of his home after a wellness check. Pt with lab derangements due to missed HD session, started on HD. Developed a-fib/flutter with RVR s/p cardioversion. PMH: ESRD on HD MWF, HTN, HLD, junctional bradycardia, stroke, gout, memory loss, bipolar depression, BPH, OSA, anemia, thrombocytopenia, Crohn's disease, ulcerative colitis  Clinical Impression  Patient seen for re-assessment following ongoing medical issues and now s/p cardioversion. He is agreeable to PT and was able to progress mobility today. Heart rate 49 at rest, increasing to 75bpm with activity. MAP 65-69 with activity with no dizziness reported. The patient does continue to require assistance with mobility. He walked into the hallway with steadying assistance provided using rolling walker. Activity tolerance limited by fatigue. Recommend to continue PT to maximize independence and decrease caregiver burden. As patient lives alone, consider rehabilitation < 3 hours/day after this hospital stay.       If plan is discharge home, recommend the following: A little help with walking and/or transfers;A little help with bathing/dressing/bathroom;Assist for transportation;Help with stairs or ramp for entrance;Supervision due to cognitive status;Assistance with cooking/housework   Can travel by private vehicle   No    Equipment Recommendations None recommended by PT  Recommendations for Other Services       Functional Status Assessment Patient has had a recent decline in their functional status and demonstrates the ability to make significant improvements in function in a reasonable and predictable amount of time.     Precautions / Restrictions Precautions Precautions: Fall Recall of Precautions/Restrictions: Impaired Restrictions Weight  Bearing Restrictions Per Provider Order: No      Mobility  Bed Mobility Overal bed mobility: Needs Assistance Bed Mobility: Supine to Sit, Sit to Supine     Supine to sit: Min assist Sit to supine: Min assist        Transfers Overall transfer level: Needs assistance Equipment used: Rolling walker (2 wheels), 2 person hand held assist Transfers: Sit to/from Stand Sit to Stand: Min assist           General transfer comment: 2 standing bouts performed, with and without rolling walker. lifting assistance provided with each stand    Ambulation/Gait Ambulation/Gait assistance: Min assist, +2 safety/equipment Gait Distance (Feet): 30 Feet Assistive device: Rolling walker (2 wheels) Gait Pattern/deviations: Step-through pattern, Trunk flexed Gait velocity: decreased     General Gait Details: no dizziness reported with activity. activity tolerance limited by fatigue. encourgaed rolling walker for safety and fall prevention given generalized weakness compared to baseline  Stairs            Wheelchair Mobility     Tilt Bed    Modified Rankin (Stroke Patients Only)       Balance Overall balance assessment: Needs assistance Sitting-balance support: Feet supported Sitting balance-Leahy Scale: Fair     Standing balance support: No upper extremity supported Standing balance-Leahy Scale: Fair                               Pertinent Vitals/Pain Pain Assessment Pain Assessment: No/denies pain    Home Living Family/patient expects to be discharged to:: Private residence Living Arrangements: Alone Available Help at Discharge:  (unsure) Type of Home: House Home Access: Level entry       Home Layout: One level  Home Equipment: Agricultural Consultant (2 wheels)      Prior Function Prior Level of Function : Independent/Modified Independent             Mobility Comments: reports taking the bus to HD, independent with walking        Extremity/Trunk Assessment   Upper Extremity Assessment Upper Extremity Assessment: Generalized weakness    Lower Extremity Assessment Lower Extremity Assessment: Generalized weakness       Communication   Communication Communication: No apparent difficulties    Cognition Arousal: Alert Behavior During Therapy: Flat affect   PT - Cognitive impairments: Initiation                         Following commands: Impaired Following commands impaired: Follows multi-step commands inconsistently     Cueing Cueing Techniques: Tactile cues, Visual cues, Verbal cues     General Comments General comments (skin integrity, edema, etc.): MAP 65-69 with activity. heart rate 49 at rest, increasing with activity up to 75bpm. no dizziness reported with activity. care plan updated accordingly based on medical issues and current functional status    Exercises     Assessment/Plan    PT Assessment Patient needs continued PT services  PT Problem List Decreased strength;Decreased range of motion;Decreased activity tolerance;Decreased balance;Decreased mobility;Decreased cognition;Decreased safety awareness       PT Treatment Interventions DME instruction;Gait training;Stair training;Functional mobility training;Therapeutic activities;Therapeutic exercise;Balance training;Neuromuscular re-education;Cognitive remediation;Patient/family education    PT Goals (Current goals can be found in the Care Plan section)  Acute Rehab PT Goals Patient Stated Goal: none stated PT Goal Formulation: With patient Time For Goal Achievement: 05/03/24 Potential to Achieve Goals: Fair    Frequency Min 2X/week     Co-evaluation PT/OT/SLP Co-Evaluation/Treatment: Yes Reason for Co-Treatment: Other (comment);To address functional/ADL transfers (limited tolerance) PT goals addressed during session: Mobility/safety with mobility OT goals addressed during session: ADL's and self-care        AM-PAC PT 6 Clicks Mobility  Outcome Measure Help needed turning from your back to your side while in a flat bed without using bedrails?: A Little Help needed moving from lying on your back to sitting on the side of a flat bed without using bedrails?: A Little Help needed moving to and from a bed to a chair (including a wheelchair)?: A Little Help needed standing up from a chair using your arms (e.g., wheelchair or bedside chair)?: A Little Help needed to walk in hospital room?: A Lot Help needed climbing 3-5 steps with a railing? : A Lot 6 Click Score: 16    End of Session   Activity Tolerance: Patient tolerated treatment well Patient left: in bed;with call bell/phone within reach;with bed alarm set Nurse Communication: Mobility status PT Visit Diagnosis: Muscle weakness (generalized) (M62.81);Unsteadiness on feet (R26.81)    Time: 8994-8974 PT Time Calculation (min) (ACUTE ONLY): 20 min   Charges:   PT Evaluation $PT Re-evaluation: 1 Re-eval   PT General Charges $$ ACUTE PT VISIT: 1 Visit         Randine Essex, PT, MPT   Randine LULLA Essex 04/19/2024, 11:55 AM

## 2024-04-19 NOTE — Progress Notes (Signed)
 "    Progress Note    Luis Miller.  FMW:[id number] DOB: [birth year]  DOA: 04/11/2024 PCP: Lateef, Munsoor, MD      Brief Narrative:    Medical records reviewed and are as summarized below:  Luis Miller. is a 79 y.o. male with ESRD on HD MWF, hypertension, hyperlipidemia, junctional bradycardia, stroke, gout, memory loss, bipolar depression, BPH, OSA, anemia, thrombocytopenia, Crohn's disease, ulcerative colitis, who was brought to the hospital after he was found on the floor after a wellness check was done at his home.  He was supposed to have dialysis on that day but he did not show up to a wellness check was done at his home. He complained of right hip pain and right flank pain.      Assessment/Plan:   Principal Problem:   Uremia Active Problems:   Junctional bradycardia   Hyperkalemia   Hypercalcemia   Hypermagnesemia   Fall at home, initial encounter   Myocardial injury   HTN (hypertension)   Hyperlipidemia   ESRD on hemodialysis (HCC)   Thrombocytopenia   Anemia in ESRD (end-stage renal disease) (HCC)   Bipolar depression (HCC)   Obstructive sleep apnea   Memory loss   Overweight (BMI 25.0-29.9)   QT prolongation   Atrial flutter (HCC)   Weakness   SOB (shortness of breath)   Dilated cardiomyopathy (HCC)     Body mass index is 29.18 kg/m.   ESRD with uremia, hyperkalemia: Uremia and hyperkalemia improved after hemodialysis. Dialysis renal site has been difficult to access.  Nephrologist.  Had recent fistulogram without intervention required. Plan for hemodialysis tomorrow   Acute metabolic encephalopathy: He had waxing and waning mental status.  Mental status has improved.    Atrial fibrillation/atrial flutter with RVR: S/p TEE and cardioversion on 04/18/2024.   Bradycardia: Patient has developed bradycardia post cardioversion.   Metoprolol  has been decreased from 25 mg to 12.5 mg twice daily. Case discussed with Dr. Gollan,  cardiologist.  He recommended monitoring in the hospital for at least another day. Continue Eliquis . S/p IV amiodarone  infusion.   Elevated troponin: This is likely due to demand ischemia.   S/p fall at home: PT and OT recommended discharge to SNF.   Bipolar disorder, depression: Continue lithium . Lithium  was initially supratherapeutic at 1.56 on 04/11/2024. Repeat lithium  on 04/14/2024 was subtherapeutic at 0.35.   Comorbidities include hypertension, hyperlipidemia, chronic thrombocytopenia OSA on CPAP at night, chronic anemia, history of junctional bradycardia     Diet Order             Diet renal with fluid restriction Fluid restriction: 1200 mL Fluid; Room service appropriate? Yes; Fluid consistency: Thin  Diet effective now                                  Consultants: Cardiologist Nephrologist  Procedures: S/P TEE and cardioversion on 04/18/2024    Medications:    apixaban   5 mg Oral BID   Chlorhexidine  Gluconate Cloth  6 each Topical Q0600   docusate sodium   100 mg Oral BID   epoetin  alfa-epbx (RETACRIT ) injection  4,000 Units Intravenous Q M,W,F-1800   gabapentin   100 mg Oral Daily   lithium  carbonate  300 mg Oral Q supper   loratadine   10 mg Oral Daily   metoprolol  tartrate  12.5 mg Oral BID   multivitamin with minerals  1 tablet Oral Daily  primidone   25 mg Oral QHS   rosuvastatin   10 mg Oral Daily   Continuous Infusions:     Anti-infectives (From admission, onward)    None              Family Communication/Anticipated D/C date and plan/Code Status   DVT prophylaxis:  apixaban  (ELIQUIS ) tablet 5 mg     Code Status: Full Code  Family Communication: None Disposition Plan: Plan to discharge to SNF   Status is: Inpatient Remains inpatient appropriate because: Uremia,  bradycardia post cardioversion       Subjective:   Interval events noted.  He had TEE and cardioversion yesterday.  She was hypotensive  after the procedure and required normal saline fluid bolus.  She was also bradycardic and metoprolol  was held overnight.  He has no complaints today.  He feels better.  Objective:    Vitals:   04/19/24 0145 04/19/24 0500 04/19/24 0811 04/19/24 1144  BP: 104/85  129/64 120/60  Pulse: (!) 59  (!) 50 62  Resp: 20  19 20   Temp:   98 F (36.7 C) 98.2 F (36.8 C)  TempSrc:      SpO2: 97%  98% 95%  Weight:  82 kg    Height:       No data found.   Intake/Output Summary (Last 24 hours) at 04/19/2024 1532 Last data filed at 04/18/2024 1700 Gross per 24 hour  Intake --  Output 250 ml  Net -250 ml   Filed Weights   04/18/24 0746 04/18/24 1214 04/19/24 0500  Weight: 84.7 kg 82.8 kg 82 kg    Exam:  GEN: NAD SKIN: Warm and dry EYES: No pallor or icterus ENT: MMM CV: RRR, bradycardic PULM: CTA B ABD: soft, ND, NT, +BS CNS: AAO x 3, non focal EXT: No edema or tenderness         Data Reviewed:   I have personally reviewed following labs and imaging studies:  Labs: Labs show the following:   Basic Metabolic Panel: Recent Labs  Lab 04/14/24 1009 04/15/24 0628 04/16/24 0003 04/17/24 0327 04/18/24 0535 04/18/24 2244  NA  --  137 137 137 137 136  K  --  3.7 3.7 3.9 3.7 4.0  CL  --  97* 97* 97* 96* 96*  CO2  --  25 25 23 27 26   GLUCOSE  --  102* 93 96 115* 121*  BUN  --  54* 68* 80* 51* 31*  CREATININE  --  6.49* 7.75* 8.79* 6.50* 5.05*  CALCIUM   --  9.9 10.1 9.4 9.3 9.0  MG 2.6* 2.5* 2.6* 2.7* 2.5* 2.2  PHOS 5.3* 5.5* 6.3* 6.5* 5.5*  --    GFR Estimated Creatinine Clearance: 11.9 mL/min (A) (by C-G formula based on SCr of 5.05 mg/dL (H)). Liver Function Tests: Recent Labs  Lab 04/15/24 0628 04/16/24 0003 04/17/24 0327 04/18/24 0535 04/18/24 2244  AST 25 23 30 26 27   ALT 10 12 18 16 17   ALKPHOS 65 64 69 63 63  BILITOT 0.5 0.4 0.3 0.3 0.3  PROT 6.2* 6.4* 6.0* 6.3* 6.2*  ALBUMIN 3.4* 3.5 3.5 3.6 3.5   No results for input(s): LIPASE, AMYLASE in  the last 168 hours. No results for input(s): AMMONIA in the last 168 hours. Coagulation profile Recent Labs  Lab 04/14/24 1009  INR 1.2    CBC: Recent Labs  Lab 04/14/24 0542 04/15/24 0628 04/16/24 0003 04/17/24 0327 04/18/24 0535  WBC 6.6 7.1 7.1 6.3 7.0  HGB 10.0* 9.6* 9.4* 9.2* 10.0*  HCT 29.5* 28.9* 28.5* 28.1* 30.9*  MCV 101.7* 102.1* 101.1* 103.3* 104.7*  PLT 174 204 221 209 205   Cardiac Enzymes: No results for input(s): CKTOTAL, CKMB, CKMBINDEX, TROPONINI in the last 168 hours.  BNP (last 3 results) No results for input(s): PROBNP in the last 8760 hours. CBG: Recent Labs  Lab 04/18/24 1251  GLUCAP 104*   D-Dimer: No results for input(s): DDIMER in the last 72 hours. Hgb A1c: No results for input(s): HGBA1C in the last 72 hours. Lipid Profile: No results for input(s): CHOL, HDL, LDLCALC, TRIG, CHOLHDL, LDLDIRECT in the last 72 hours. Thyroid  function studies: Recent Labs    04/19/24 1000  TSH 2.430   Anemia work up: No results for input(s): VITAMINB12, FOLATE, FERRITIN, TIBC, IRON, RETICCTPCT in the last 72 hours. Sepsis Labs: Recent Labs  Lab 04/14/24 0542 04/15/24 0628 04/16/24 0003 04/17/24 0327 04/18/24 0535  WBC 6.6 7.1 7.1 6.3 7.0  LATICACIDVEN 1.1  --   --   --   --     Microbiology No results found for this or any previous visit (from the past 240 hours).  Procedures and diagnostic studies:  ECHO TEE Result Date: 04/18/2024    TRANSESOPHOGEAL ECHO REPORT   Patient Name:   Luis Abdallah. Date of Exam: 04/18/2024 Medical Rec #:  [id number]          Height:       66.0 in Accession #:    [id number]         Weight:       186.7 lb Date of Birth:  1946-01-05          BSA:          1.942 m Patient Age:    78 years           BP:           107/84 mmHg Patient Gender: M                  HR:           110 bpm. Exam Location:  [facility] Procedure: Transesophageal Echo, Cardiac Doppler, Color Doppler and Saline             Contrast Bubble Study (Both Spectral and Color Flow Doppler were            utilized during procedure). Indications:     Atrial Flutter  History:         Patient has prior history of Echocardiogram examinations, most                  recent 04/14/2024. Risk Factors:Hypertension and Dyslipidemia.  Sonographer:     Christopher Furnace Referring Phys:  [id number] SHERI HAMMOCK Diagnosing Phys: Evalene Lunger MD PROCEDURE: After discussion of the risks and benefits of a TEE, an informed consent was obtained from the patient. TEE procedure time was 30 minutes. The transesophogeal probe was passed without difficulty through the esophogus of the patient. Imaged were obtained with the patient in a left lateral decubitus position. Local oropharyngeal anesthetic was provided with Cetacaine  and viscous lidocaine . Sedation performed by different physician. Image quality was excellent. The patient's vital signs; including heart rate, blood pressure, and oxygen saturation; remained stable throughout the procedure. The patient developed no complications during the procedure. A successful direct current cardioversion was performed at 200 joules with 1 attempt.  IMPRESSIONS  1. Left ventricular ejection fraction, by estimation, is  40 to 45%. The left ventricle has mildly decreased function. The left ventricle demonstrates global hypokinesis.  2. Right ventricular systolic function is mildly reduced. The right ventricular size is normal.  3. No left atrial/left atrial appendage thrombus was detected.  4. The mitral valve is normal in structure. Mild mitral valve regurgitation. No evidence of mitral stenosis.  5. The aortic valve is normal in structure. Aortic valve regurgitation is not visualized. No aortic stenosis is present.  6. The inferior vena cava is normal in size with greater than 50% respiratory variability, suggesting right atrial pressure of 3 mmHg.  7. Agitated saline contrast bubble study was negative, with no evidence of  any interatrial shunt.  8. Cardioversion to follow Conclusion(s)/Recommendation(s): FINDINGS  Left Ventricle: Left ventricular ejection fraction, by estimation, is 40 to 45%. The left ventricle has mildly decreased function. The left ventricle demonstrates global hypokinesis. The left ventricular internal cavity size was normal in size. There is  no left ventricular hypertrophy. Right Ventricle: The right ventricular size is normal. No increase in right ventricular wall thickness. Right ventricular systolic function is mildly reduced. Left Atrium: Left atrial size was normal in size. No left atrial/left atrial appendage thrombus was detected. Right Atrium: Right atrial size was normal in size. Pericardium: There is no evidence of pericardial effusion. Mitral Valve: The mitral valve is normal in structure. Mild mitral valve regurgitation. No evidence of mitral valve stenosis. Tricuspid Valve: The tricuspid valve is normal in structure. Tricuspid valve regurgitation is not demonstrated. No evidence of tricuspid stenosis. Aortic Valve: The aortic valve is normal in structure. Aortic valve regurgitation is not visualized. No aortic stenosis is present. Pulmonic Valve: The pulmonic valve was normal in structure. Pulmonic valve regurgitation is not visualized. No evidence of pulmonic stenosis. Aorta: The aortic root is normal in size and structure. Venous: The inferior vena cava is normal in size with greater than 50% respiratory variability, suggesting right atrial pressure of 3 mmHg. IAS/Shunts: No atrial level shunt detected by color flow Doppler. Agitated saline contrast was given intravenously to evaluate for intracardiac shunting. Agitated saline contrast bubble study was negative, with no evidence of any interatrial shunt. There  is no evidence of a patent foramen ovale. There is no evidence of an atrial septal defect. Additional Comments: 3D was performed not requiring image post processing on an independent  workstation and was indeterminate. Evalene Lunger MD Electronically signed by Evalene Lunger MD Signature Date/Time: 04/18/2024/4:23:54 PM    Final                LOS: 7 days   AIDA CHO  [facility]   Pager on [email or web address]. If 7PM-7AM, please contact night-coverage at [email or web address]     04/19/2024, 3:32 PM           "

## 2024-04-19 NOTE — Telephone Encounter (Signed)
 I have reviewed most recent lithium  level received from [facility] dated 04/10/2024.  1.09.  Uptrending.  However per review of medical records patient had multiple lithium  levels drawn since he got admitted to the hospital/medical service.  Will await discharge instructions.

## 2024-04-19 NOTE — Progress Notes (Signed)
"  ° °      Overnight   NAME: Luis Miller. MRN: [id number] DOB : 1945-12-16    Date of Service   04/19/2024   HPI/Events of Note   HPI:  79 y.o. male with ESRD on HD MWF, hypertension, hyperlipidemia, junctional bradycardia, stroke, gout, memory loss, bipolar depression, BPH, OSA, anemia, thrombocytopenia, Crohn's disease, ulcerative colitis, who was brought to the hospital after he was found on the floor after a wellness check was done at his home.  He was supposed to have dialysis on that day but he did not show up to a wellness check was done at his home. He complained of right hip pain and right flank pain.     Overnight:  Rn reports rash to patient back that is itchy in nature. Pt reports this developed shortly after they started using CHG wipes on him. Denies rash to any other location     Interventions/ Plan   PO 25 mg benadryl  Cetaphil cream  D/c chg baths      Updates     Kaydin Karbowski Donati- Garmon BSN RN CCRN AGACNP-BC Acute Care Nurse Practitioner [facility] [place]  "

## 2024-04-19 NOTE — TOC Progression Note (Addendum)
 Transition of Care (TOC) - Progression Note    Patient Details  Name: Luis Miller. MRN: [id number] Date of Birth: 1945-08-13  Transition of Care [facility] CM/SW Contact  Lauraine JAYSON Carpen, LCSW Phone Number: 04/19/2024, 9:21 AM  Clinical Narrative:   Left message for [facility] admissions coordinator to see if they have a bed today.  11:36 am: Per MD, not ready for discharge today. SNF admissions coordinator is aware.  Expected Discharge Plan: Skilled Nursing Facility Barriers to Discharge: Continued Medical Work up               Expected Discharge Plan and Services     Post Acute Care Choice: Skilled Nursing Facility Living arrangements for the past 2 months: Single Family Home                                       Social Drivers of Health (SDOH) Interventions SDOH Screenings   Food Insecurity: No Food Insecurity (04/11/2024)  Housing: Low Risk (04/11/2024)  Transportation Needs: No Transportation Needs (04/11/2024)  Utilities: Not At Risk (04/11/2024)  Alcohol Screen: Low Risk (09/30/2022)  Depression (PHQ2-9): Low Risk (01/18/2024)  Financial Resource Strain: Low Risk  (01/20/2024)   Received from [facility]  Physical Activity: Insufficiently Active (12/07/2021)  Social Connections: Socially Isolated (04/11/2024)  Stress: No Stress Concern Present (12/07/2021)  Tobacco Use: Medium Risk (04/18/2024)    Readmission Risk Interventions     No data to display

## 2024-04-19 NOTE — Evaluation (Addendum)
 Occupational Therapy Evaluation Patient Details Name: Luis Miller. MRN: [id number] DOB: [birth year] Today's Date: 04/19/2024   History of Present Illness   Pt is a 79 year old male found down outside of his home after a wellness check. Pt with lab derangements due to missed HD session, started on HD. Developed a-fib/flutter with RVR s/p cardioversion. PMH: ESRD on HD MWF, HTN, HLD, junctional bradycardia, stroke, gout, memory loss, bipolar depression, BPH, OSA, anemia, thrombocytopenia, Crohn's disease, ulcerative colitis     Clinical Impressions Pt is seen for OT/PT re-evaluation this date with goals/care plan updated to reflect medical status s/p cardioversion. Pt is supine in bed on arrival. He denies pain. Pt performed bed mobility, transfers and ADL transfer/functional mobility ~20 ft with Min A +2 for safety and endurance. HR 49 at rest and to 72 with activity. BP table and pt denies dizziness throughout session. Pt endorses fatigue with short distance mobility. Standing grooming tasks performed with unilateral support on counter to maintain balance. Pt returned to bed with all needs in place and will cont to require skilled acute OT services to maximize his safety and IND to return to PLOF.      If plan is discharge home, recommend the following:   A little help with bathing/dressing/bathroom;Help with stairs or ramp for entrance;A little help with walking and/or transfers;Assistance with cooking/housework     Functional Status Assessment   Patient has had a recent decline in their functional status and demonstrates the ability to make significant improvements in function in a reasonable and predictable amount of time.     Equipment Recommendations   BSC/3in1;Other (comment) (defer to next venue)     Recommendations for Other Services         Precautions/Restrictions   Precautions Precautions: Fall Recall of Precautions/Restrictions:  Impaired Restrictions Weight Bearing Restrictions Per Provider Order: No     Mobility Bed Mobility Overal bed mobility: Needs Assistance Bed Mobility: Supine to Sit, Sit to Supine     Supine to sit: Min assist Sit to supine: Min assist   General bed mobility comments: cues for technique    Transfers Overall transfer level: Needs assistance Equipment used: Rolling walker (2 wheels), 2 person hand held assist Transfers: Sit to/from Stand Sit to Stand: Min assist           General transfer comment: stood from EOB x2 bouts without AD and using RW      Balance Overall balance assessment: Needs assistance Sitting-balance support: Feet supported Sitting balance-Leahy Scale: Fair     Standing balance support: No upper extremity supported Standing balance-Leahy Scale: Fair Standing balance comment: unilateral support on sink counter during grooming tasks                           ADL either performed or assessed with clinical judgement   ADL Overall ADL's : Needs assistance/impaired     Grooming: Oral care;Wash/dry face;Wash/dry hands;Minimal assistance;Contact guard assist;Standing                                 General ADL Comments: CGA for standing grooming tasks with BUE or unilateral support required on sink counter and seated rest breaks between activity; Min A x2 + RW for ambulation bout ~20 ft     Vision         Perception  Praxis         Pertinent Vitals/Pain Pain Assessment Pain Assessment: No/denies pain     Extremity/Trunk Assessment Upper Extremity Assessment Upper Extremity Assessment: Generalized weakness   Lower Extremity Assessment Lower Extremity Assessment: Generalized weakness       Communication Communication Communication: No apparent difficulties   Cognition Arousal: Alert Behavior During Therapy: Flat affect                                 Following commands:  Impaired Following commands impaired: Follows multi-step commands inconsistently     Cueing  General Comments   Cueing Techniques: Tactile cues;Visual cues;Verbal cues  HR stable with activity up to 72bpm, 49 at rest; denies dizziness and BP stable   Exercises     Shoulder Instructions      Home Living Family/patient expects to be discharged to:: Private residence Living Arrangements: Alone Available Help at Discharge:  (unsure) Type of Home: House Home Access: Level entry     Home Layout: One level               Home Equipment: Agricultural [employer]ant (2 wheels)          Prior Functioning/Environment Prior Level of Function : Independent/Modified Independent             Mobility Comments: reports taking the bus to HD, independent with walking      OT Problem List: Decreased strength;Decreased range of motion;Decreased activity tolerance;Impaired balance (sitting and/or standing);Decreased safety awareness   OT Treatment/Interventions: Self-care/ADL training;Therapeutic exercise;DME and/or AE instruction;Energy conservation;Therapeutic activities;Patient/family education      OT Goals(Current goals can be found in the care plan section)   Acute Rehab OT Goals Patient Stated Goal: get stronger OT Goal Formulation: With patient Time For Goal Achievement: 05/03/24 Potential to Achieve Goals: Good   OT Frequency:  Min 2X/week    Co-evaluation PT/OT/SLP Co-Evaluation/Treatment: Yes Reason for Co-Treatment: Other (comment);To address functional/ADL transfers (limited tolerance) PT goals addressed during session: Mobility/safety with mobility OT goals addressed during session: ADL's and self-care      AM-PAC OT 6 Clicks Daily Activity     Outcome Measure Help from another person eating meals?: None Help from another person taking care of personal grooming?: A Little Help from another person toileting, which includes using toliet, bedpan, or urinal?: A  Little Help from another person bathing (including washing, rinsing, drying)?: A Lot Help from another person to put on and taking off regular upper body clothing?: A Little Help from another person to put on and taking off regular lower body clothing?: A Lot 6 Click Score: 17   End of Session Equipment Utilized During Treatment: Rolling walker (2 wheels) Nurse Communication: Mobility status  Activity Tolerance: Patient tolerated treatment well Patient left: in bed;with call bell/phone within reach;with bed alarm set  OT Visit Diagnosis: Other abnormalities of gait and mobility (R26.89);Muscle weakness (generalized) (M62.81)                Time: 8994-8974 OT Time Calculation (min): 20 min Charges:  OT General Charges $OT Visit: 1 Visit OT Evaluation $OT Re-eval: 1 Re-eval Mireyah Chervenak Chrismon, OTR/L  04/19/24, 1:15 PM  Reyaan Thoma E Chrismon 04/19/2024, 1:00 PM

## 2024-04-19 NOTE — Plan of Care (Signed)

## 2024-04-19 NOTE — Progress Notes (Signed)
"  °  Progress Note  Patient Name: Luis Miller. Date of Encounter: 04/19/2024 [facility] Cardiologist: None   Interval Summary     He is s/p successful TEE/DCCV and remains in SR with HR 50s, as low as 40s at times. No chest pain or SOB reported.   Vital Signs Vitals:   04/19/24 0058 04/19/24 0145 04/19/24 0500 04/19/24 0811  BP:  104/85  129/64  Pulse:  (!) 59  (!) 50  Resp:  20  19  Temp: 97.9 F (36.6 C)   98 F (36.7 C)  TempSrc:      SpO2:  97%  98%  Weight:   82 kg   Height:        Intake/Output Summary (Last 24 hours) at 04/19/2024 1002 Last data filed at 04/18/2024 1700 Gross per 24 hour  Intake 300 ml  Output 2150 ml  Net -1850 ml      04/19/2024    5:00 AM 04/18/2024   12:14 PM 04/18/2024    7:46 AM  Last 3 Weights  Weight (lbs) 180 lb 12.4 oz 182 lb 8.7 oz 186 lb 11.7 oz  Weight (kg) 82 kg 82.8 kg 84.7 kg      Telemetry/ECG  SR RH 50s, down to the 40s  - Personally Reviewed  Physical Exam  GEN: No acute distress.   Neck: No JVD Cardiac: RR, brady, no murmurs, rubs, or gallops.  Respiratory: Clear to auscultation bilaterally. GI: Soft, nontender, non-distended  MS: No edema  Cardiac Studies 2d echo 04/13/2024 1. Left ventricular ejection fraction, by estimation, is 60 to 65%. The  left ventricle has normal function. The left ventricle has no regional  wall motion abnormalities. Left ventricular diastolic parameters are  indeterminate.   2. Right ventricular systolic function is normal. The right ventricular  size is normal. There is normal pulmonary artery systolic pressure.   3. Left atrial size was mildly dilated.   4. The mitral valve is normal in structure. No evidence of mitral valve  regurgitation. No evidence of mitral stenosis.   5. The aortic valve is normal in structure. Aortic valve regurgitation is  not visualized. No aortic stenosis is present.   6. The inferior vena cava is normal in size with greater than 50%   respiratory variability, suggesting right atrial pressure of 3 mmHg.    Patient Profile   79 y.o. male with a past medical history of end-stage renal disease on hemodialysis, CVA, junctional bradycardia, hypertension, hyperlipidemia, OSA, Crohn's disease, bipolar disorder who has been seen and evaluated for atrial flutter.    Assessment & Plan   Aflutter - s/p TEE/DCCV. Post DCCV he had junctional rhythm and amio and BB were held - remains in SR with HR in the 50s - BB held for low heart rates, stop amio - may not need BB therapy on d/c. Continue to monitor - echo showed normal LVEF - continue Eliquis  5mg  BID  Demand ischemia - HS trop 114>122 - no ischemic work- up at this time  ESRD - HD M,W, - reportedly missed several Hd days prior to admission    For questions or updates, please contact [facility] Please consult [email or web address] for contact info under         Signed, Levia Waltermire VEAR Fishman, PA-C   "

## 2024-04-19 NOTE — Plan of Care (Signed)
" °  Problem: Education: Goal: Knowledge of General Education information will improve Description: Including pain rating scale, medication(s)/side effects and non-pharmacologic comfort measures 04/19/2024 0744 by Cleotis Erminio SAUNDERS, RN Outcome: Progressing 04/19/2024 0743 by Cleotis Erminio SAUNDERS, RN Outcome: Progressing   Problem: Health Behavior/Discharge Planning: Goal: Ability to manage health-related needs will improve 04/19/2024 0744 by Cleotis Erminio SAUNDERS, RN Outcome: Progressing 04/19/2024 0743 by Cleotis Erminio SAUNDERS, RN Outcome: Progressing   Problem: Clinical Measurements: Goal: Ability to maintain clinical measurements within normal limits will improve 04/19/2024 0744 by Cleotis Erminio SAUNDERS, RN Outcome: Progressing 04/19/2024 0743 by Cleotis Erminio SAUNDERS, RN Outcome: Progressing   Problem: Clinical Measurements: Goal: Ability to maintain clinical measurements within normal limits will improve 04/19/2024 0744 by Cleotis Erminio SAUNDERS, RN Outcome: Progressing 04/19/2024 0743 by Cleotis Erminio SAUNDERS, RN Outcome: Progressing Goal: Will remain free from infection 04/19/2024 0744 by Cleotis Erminio SAUNDERS, RN Outcome: Progressing 04/19/2024 0743 by Cleotis Erminio SAUNDERS, RN Outcome: Progressing Goal: Diagnostic test results will improve 04/19/2024 0744 by Cleotis Erminio SAUNDERS, RN Outcome: Progressing 04/19/2024 0743 by Cleotis Erminio SAUNDERS, RN Outcome: Progressing Goal: Respiratory complications will improve 04/19/2024 0744 by Cleotis Erminio SAUNDERS, RN Outcome: Progressing 04/19/2024 0743 by Cleotis Erminio SAUNDERS, RN Outcome: Progressing Goal: Cardiovascular complication will be avoided 04/19/2024 0744 by Cleotis Erminio SAUNDERS, RN Outcome: Progressing 04/19/2024 0743 by Cleotis Erminio SAUNDERS, RN Outcome: Progressing   Problem: Activity: Goal: Risk for activity intolerance will decrease 04/19/2024 0744 by Cleotis Erminio SAUNDERS, RN Outcome: Progressing 04/19/2024 0743 by Cleotis Erminio SAUNDERS, RN Outcome: Progressing    Problem: Nutrition: Goal: Adequate nutrition will be maintained 04/19/2024 0744 by Cleotis Erminio SAUNDERS, RN Outcome: Progressing 04/19/2024 0743 by Cleotis Erminio SAUNDERS, RN Outcome: Progressing   Problem: Coping: Goal: Level of anxiety will decrease 04/19/2024 0744 by Cleotis Erminio SAUNDERS, RN Outcome: Progressing 04/19/2024 0743 by Cleotis Erminio SAUNDERS, RN Outcome: Progressing   Problem: Elimination: Goal: Will not experience complications related to bowel motility 04/19/2024 0744 by Cleotis Erminio SAUNDERS, RN Outcome: Progressing 04/19/2024 0743 by Cleotis Erminio SAUNDERS, RN Outcome: Progressing Goal: Will not experience complications related to urinary retention 04/19/2024 0744 by Cleotis Erminio SAUNDERS, RN Outcome: Progressing 04/19/2024 0743 by Cleotis Erminio SAUNDERS, RN Outcome: Progressing   Problem: Pain Managment: Goal: General experience of comfort will improve and/or be controlled 04/19/2024 0744 by Cleotis Erminio SAUNDERS, RN Outcome: Progressing 04/19/2024 0743 by Cleotis Erminio SAUNDERS, RN Outcome: Progressing   Problem: Safety: Goal: Ability to remain free from injury will improve 04/19/2024 0744 by Cleotis Erminio SAUNDERS, RN Outcome: Progressing 04/19/2024 0743 by Cleotis Erminio SAUNDERS, RN Outcome: Progressing   Problem: Skin Integrity: Goal: Risk for impaired skin integrity will decrease 04/19/2024 0744 by Cleotis Erminio SAUNDERS, RN Outcome: Progressing 04/19/2024 0743 by Cleotis Erminio SAUNDERS, RN Outcome: Progressing   "

## 2024-04-20 ENCOUNTER — Encounter: Payer: Self-pay | Admitting: Cardiovascular Disease

## 2024-04-20 DIAGNOSIS — I2489 Other forms of acute ischemic heart disease: Secondary | ICD-10-CM

## 2024-04-20 DIAGNOSIS — I5020 Unspecified systolic (congestive) heart failure: Secondary | ICD-10-CM

## 2024-04-20 DIAGNOSIS — I495 Sick sinus syndrome: Secondary | ICD-10-CM | POA: Diagnosis not present

## 2024-04-20 DIAGNOSIS — N19 Unspecified kidney failure: Secondary | ICD-10-CM | POA: Diagnosis not present

## 2024-04-20 DIAGNOSIS — R001 Bradycardia, unspecified: Secondary | ICD-10-CM | POA: Diagnosis not present

## 2024-04-20 DIAGNOSIS — I483 Typical atrial flutter: Secondary | ICD-10-CM | POA: Diagnosis not present

## 2024-04-20 LAB — RENAL FUNCTION PANEL
Albumin: 3.6 g/dL (ref 3.5–5.0)
Anion gap: 14 (ref 5–15)
BUN: 54 mg/dL — ABNORMAL HIGH (ref 8–23)
CO2: 25 mmol/L (ref 22–32)
Calcium: 9.4 mg/dL (ref 8.9–10.3)
Chloride: 96 mmol/L — ABNORMAL LOW (ref 98–111)
Creatinine, Ser: 7.87 mg/dL — ABNORMAL HIGH (ref 0.61–1.24)
GFR, Estimated: 6 mL/min — ABNORMAL LOW
Glucose, Bld: 103 mg/dL — ABNORMAL HIGH (ref 70–99)
Phosphorus: 6.1 mg/dL — ABNORMAL HIGH (ref 2.5–4.6)
Potassium: 3.7 mmol/L (ref 3.5–5.1)
Sodium: 135 mmol/L (ref 135–145)

## 2024-04-20 LAB — CBC
HCT: 28.2 % — ABNORMAL LOW (ref 39.0–52.0)
Hemoglobin: 9.2 g/dL — ABNORMAL LOW (ref 13.0–17.0)
MCH: 33.5 pg (ref 26.0–34.0)
MCHC: 32.6 g/dL (ref 30.0–36.0)
MCV: 102.5 fL — ABNORMAL HIGH (ref 80.0–100.0)
Platelets: 204 10*3/uL (ref 150–400)
RBC: 2.75 MIL/uL — ABNORMAL LOW (ref 4.22–5.81)
RDW: 14.4 % (ref 11.5–15.5)
WBC: 6.5 10*3/uL (ref 4.0–10.5)
nRBC: 0 % (ref 0.0–0.2)

## 2024-04-20 LAB — GLUCOSE, CAPILLARY
Glucose-Capillary: 101 mg/dL — ABNORMAL HIGH (ref 70–99)
Glucose-Capillary: 138 mg/dL — ABNORMAL HIGH (ref 70–99)

## 2024-04-20 LAB — LITHIUM LEVEL: Lithium Lvl: 0.74 mmol/L (ref 0.60–1.20)

## 2024-04-20 MED ORDER — PENTAFLUOROPROP-TETRAFLUOROETH EX AERO: 1.0000 | INHALATION_SPRAY | CUTANEOUS | Status: DC | PRN

## 2024-04-20 MED ORDER — SEVELAMER CARBONATE 800 MG PO TABS
1600.0000 mg | ORAL_TABLET | Freq: Three times a day (TID) | ORAL | Status: DC
  Administered 2024-04-20: 1600 mg via ORAL
  Filled 2024-04-20: qty 2

## 2024-04-20 MED ORDER — LIDOCAINE-PRILOCAINE 2.5-2.5 % EX CREA: 1.0000 | TOPICAL_CREAM | CUTANEOUS | Status: DC | PRN

## 2024-04-20 MED ORDER — EPOETIN ALFA-EPBX 4000 UNIT/ML IJ SOLN
INTRAMUSCULAR | Status: AC
  Filled 2024-04-20: qty 1

## 2024-04-20 MED ORDER — DIPHENHYDRAMINE HCL 25 MG PO CAPS
25.0000 mg | ORAL_CAPSULE | Freq: Once | ORAL | Status: AC
  Administered 2024-04-20: 25 mg via ORAL
  Filled 2024-04-20: qty 1

## 2024-04-20 MED ORDER — HYDROCORTISONE 1 % EX CREA
1.0000 | TOPICAL_CREAM | Freq: Once | CUTANEOUS | Status: AC
  Administered 2024-04-20: 1 via TOPICAL
  Filled 2024-04-20: qty 28

## 2024-04-20 MED ORDER — APIXABAN 5 MG PO TABS: 5.0000 mg | ORAL_TABLET | Freq: Two times a day (BID) | ORAL | Status: AC

## 2024-04-20 MED ORDER — NEPRO/CARBSTEADY PO LIQD
237.0000 mL | Freq: Two times a day (BID) | ORAL | Status: DC
  Administered 2024-04-20: 237 mL via ORAL

## 2024-04-20 MED ORDER — HEPARIN SODIUM (PORCINE) 1000 UNIT/ML DIALYSIS: 1000.0000 [IU] | INTRAMUSCULAR | Status: DC | PRN

## 2024-04-20 MED ORDER — SEVELAMER CARBONATE 800 MG PO TABS: 1600.0000 mg | ORAL_TABLET | Freq: Three times a day (TID) | ORAL | Status: AC

## 2024-04-20 MED ORDER — HYDROCERIN EX CREA: 1.0000 | TOPICAL_CREAM | Freq: Two times a day (BID) | CUTANEOUS | Status: AC

## 2024-04-20 NOTE — Progress Notes (Signed)
"  °  Progress Note  Patient Name: Tarun Patchell. Date of Encounter: 04/20/2024 [facility] Cardiologist: None   Interval Summary    Seen in dialysis. He remains in SR with HR in the 50s and 60s.   Vital Signs Vitals:   04/20/24 0900 04/20/24 0905 04/20/24 0930 04/20/24 1000  BP: 93/60 135/62 113/62 (!) 96/56  Pulse: 63 62 (!) 47 (!) 46  Resp: 16 19 17 17   Temp:      TempSrc:      SpO2: 100% 100% 100% 100%  Weight:      Height:       No intake or output data in the 24 hours ending 04/20/24 1100    04/20/2024    8:20 AM 04/20/2024    5:00 AM 04/19/2024    5:00 AM  Last 3 Weights  Weight (lbs) 182 lb 12.2 oz 182 lb 12.2 oz 180 lb 12.4 oz  Weight (kg) 82.9 kg 82.9 kg 82 kg      Telemetry/ECG  NSR HR 50-60s - Personally Reviewed  Physical Exam  GEN: No acute distress.   Neck: No JVD Cardiac: RRR, no murmurs, rubs, or gallops.  Respiratory: Clear to auscultation bilaterally. GI: Soft, nontender, non-distended  MS: No edema  Cardiac Studies 2d echo 04/13/2024 1. Left ventricular ejection fraction, by estimation, is 60 to 65%. The  left ventricle has normal function. The left ventricle has no regional  wall motion abnormalities. Left ventricular diastolic parameters are  indeterminate.   2. Right ventricular systolic function is normal. The right ventricular  size is normal. There is normal pulmonary artery systolic pressure.   3. Left atrial size was mildly dilated.   4. The mitral valve is normal in structure. No evidence of mitral valve  regurgitation. No evidence of mitral stenosis.   5. The aortic valve is normal in structure. Aortic valve regurgitation is  not visualized. No aortic stenosis is present.   6. The inferior vena cava is normal in size with greater than 50%  respiratory variability, suggesting right atrial pressure of 3 mmHg.    Patient Profile   79 y.o. male with a past medical history of end-stage renal disease on hemodialysis, CVA,  junctional bradycardia, hypertension, hyperlipidemia, OSA, Crohn's disease, bipolar disorder who has been seen and evaluated for atrial flutter.  Assessment & Plan   Aflutter - s/p TEE/DCCV. Post DCCV he had junctional rhythm and amio and BB were held - remains in SR with HR in the 50s - BB held for low heart rates, stop amio - restarted on Toprol  12.5mg  daily.  - echo showed normal LVEF - continue Eliquis  5mg  BID   Demand ischemia - HS trop 114>122 - no ischemic work- up at this time   ESRD - HD M,W, - reportedly missed several Hd days prior to admission    For questions or updates, please contact [facility] Please consult [email or web address] for contact info under         Signed, Lubertha Leite VEAR Fishman, PA-C   "

## 2024-04-20 NOTE — Plan of Care (Signed)

## 2024-04-20 NOTE — Plan of Care (Signed)
 Pt alert oriented x 4. 2L nasal cannula. Rash noted to entire back, pt itching. On call NP informed. Back cleansed multiple times to help with itching. Received orders for benadryl  x 2, Cetaphil applied, then later hydrocortisone  applied. Medications were effective for a while but then pt verbalized itching was back.    Problem: Education: Goal: Knowledge of General Education information will improve Description: Including pain rating scale, medication(s)/side effects and non-pharmacologic comfort measures Outcome: Progressing   Problem: Health Behavior/Discharge Planning: Goal: Ability to manage health-related needs will improve Outcome: Progressing   Problem: Clinical Measurements: Goal: Ability to maintain clinical measurements within normal limits will improve Outcome: Progressing Goal: Will remain free from infection Outcome: Progressing Goal: Diagnostic test results will improve Outcome: Progressing Goal: Respiratory complications will improve Outcome: Progressing Goal: Cardiovascular complication will be avoided Outcome: Progressing   Problem: Activity: Goal: Risk for activity intolerance will decrease Outcome: Progressing   Problem: Nutrition: Goal: Adequate nutrition will be maintained Outcome: Progressing   Problem: Coping: Goal: Level of anxiety will decrease Outcome: Progressing   Problem: Elimination: Goal: Will not experience complications related to bowel motility Outcome: Progressing Goal: Will not experience complications related to urinary retention Outcome: Progressing   Problem: Pain Managment: Goal: General experience of comfort will improve and/or be controlled Outcome: Progressing   Problem: Safety: Goal: Ability to remain free from injury will improve Outcome: Progressing   Problem: Skin Integrity: Goal: Risk for impaired skin integrity will decrease Outcome: Progressing

## 2024-04-20 NOTE — Progress Notes (Signed)
" °   04/20/24 1148  Vitals  Temp 98.1 F (36.7 C)  Temp Source Oral  BP 107/62  MAP (mmHg) 74  BP Location Right Arm  BP Method Automatic  Patient Position (if appropriate) Lying  Pulse Rate (!) 50  Pulse Rate Source Monitor  ECG Heart Rate (!) 51  Resp 17  Oxygen Therapy  SpO2 100 %  O2 Device Nasal Cannula  O2 Flow Rate (L/min) 2 L/min  Patient Activity (if Appropriate) In bed  Pulse Oximetry Type Continuous  Oximetry Probe Site Changed No  During Treatment Monitoring  Blood Flow Rate (mL/min) 349 mL/min  Arterial Pressure (mmHg) -219.38 mmHg  Venous Pressure (mmHg) 166.66 mmHg  TMP (mmHg) 11.31 mmHg  Ultrafiltration Rate (mL/min) 503 mL/min  Dialysate Flow Rate (mL/min) 300 ml/min  Dialysate Potassium Concentration 3  Dialysate Calcium  Concentration 2.5  Duration of HD Treatment -hour(s) 2.97 hour(s)  Cumulative Fluid Removed (mL) per Treatment  1184.69  HD Safety Checks Performed Yes  Intra-Hemodialysis Comments Tx completed  Post Treatment  Dialyzer Clearance Lightly streaked  Hemodialysis Intake (mL) 0 mL  Liters Processed 63.5  Fluid Removed (mL) 1200 mL  Tolerated HD Treatment Yes  Fistula / Graft Left Upper arm Arteriovenous fistula  No placement date or time found.   Orientation: Left  Access Location: Upper arm  Access Type: Arteriovenous fistula  Site Condition No complications  Fistula / Graft Assessment Present;Thrill;Bruit  Status Flushed;Deaccessed;Patent  Drainage Description None   Pt tolerated tx well. Removed 1.2 L Vss, Report given to Floor nurse.  "

## 2024-04-20 NOTE — Progress Notes (Signed)
 Occupational Therapy Treatment Patient Details Name: Luis Miller. MRN: [id number] DOB: Apr 08, 1945 Today's Date: 04/20/2024   History of present illness Pt is a 79 year old male found down outside of his home after a wellness check. Pt with lab derangements due to missed HD session, started on HD. Developed a-fib/flutter with RVR s/p cardioversion. PMH: ESRD on HD MWF, HTN, HLD, junctional bradycardia, stroke, gout, memory loss, bipolar depression, BPH, OSA, anemia, thrombocytopenia, Crohn's disease, ulcerative colitis   OT comments  Pt is supine in bed on arrival. Pleasant and agreeable to OT session. He denies pain initially, but then reports a headache that began while sitting EOB-reports he gets these during dialysis/after dialysis at baseline. He required CGA for bed mobility tasks with increased time and cues for initiation. Pt performed ADL session while seated EOB requiring Min A to supervision for seated UB ADLs. Pt required Mod A for LB bathing and Min A for LB dressing to donn mesh underwear via figure four. CGA for 3 STS bouts from EOB to RW. Pt declined functional mobility d/t headache, but able to take lateral steps to [facility] with Min/CGA for safety. HR 73bpm and sp02 stable at 93% on RA. Pt returned to bed with all needs in place and will cont to require skilled acute OT services to maximize his safety and IND to return to PLOF.       If plan is discharge home, recommend the following:  A little help with bathing/dressing/bathroom;Help with stairs or ramp for entrance;A little help with walking and/or transfers;Assistance with cooking/housework   Equipment Recommendations  BSC/3in1;Other (comment) (defer to next venue)    Recommendations for Other Services      Precautions / Restrictions Precautions Precautions: Fall Recall of Precautions/Restrictions: Impaired Restrictions Weight Bearing Restrictions Per Provider Order: No       Mobility Bed Mobility Overal bed  mobility: Needs Assistance Bed Mobility: Supine to Sit, Sit to Supine     Supine to sit: Min assist, Contact guard, Used rails, HOB elevated Sit to supine: Contact guard assist, Used rails   General bed mobility comments: cues for technique and initiation    Transfers Overall transfer level: Needs assistance Equipment used: Rolling walker (2 wheels) Transfers: Sit to/from Stand Sit to Stand: Contact guard assist, Min assist           General transfer comment: increased time and cues for safety to stand x3 bouts form EOB to RW during session for ADL performance     Balance Overall balance assessment: Needs assistance Sitting-balance support: Feet supported Sitting balance-Leahy Scale: Good     Standing balance support: During functional activity, Single extremity supported Standing balance-Leahy Scale: Fair Standing balance comment: LB dressing with unilateral support on RW                           ADL either performed or assessed with clinical judgement   ADL Overall ADL's : Needs assistance/impaired     Grooming: Wash/dry face;Sitting;Set up;Applying deodorant Grooming Details (indicate cue type and reason): seated on EOB Upper Body Bathing: Supervision/ safety;Cueing for sequencing;Sitting Upper Body Bathing Details (indicate cue type and reason): cues for initiation while seated on EOB Lower Body Bathing: Moderate assistance;Sit to/from stand;Sitting/lateral leans Lower Body Bathing Details (indicate cue type and reason): below the knees and peri-region in standing after BM smears on chux pad Upper Body Dressing : Minimal assistance;Sitting Upper Body Dressing Details (indicate cue type and reason):  doff gown and donn paper scrub top Lower Body Dressing: Minimal assistance;Sit to/from stand;Sitting/lateral leans Lower Body Dressing Details (indicate cue type and reason): donn mesh underwear                    Extremity/Trunk Assessment               Vision       Perception     Praxis     Communication Communication Communication: No apparent difficulties   Cognition Arousal: Alert Behavior During Therapy: Flat affect                                 Following commands: Impaired Following commands impaired: Follows multi-step commands inconsistently      Cueing   Cueing Techniques: Tactile cues, Visual cues, Verbal cues  Exercises      Shoulder Instructions       General Comments sp02 93% on RA and HR 73 bpm after activity    Pertinent Vitals/ Pain       Pain Assessment Pain Assessment: Faces Faces Pain Scale: Hurts little more Pain Location: headache Pain Descriptors / Indicators: Aching, Headache Pain Intervention(s): Monitored during session, Repositioned  Home Living                                          Prior Functioning/Environment              Frequency  Min 2X/week        Progress Toward Goals  OT Goals(current goals can now be found in the care plan section)  Progress towards OT goals: Progressing toward goals  Acute Rehab OT Goals Patient Stated Goal: get stronger OT Goal Formulation: With patient Time For Goal Achievement: 05/03/24 Potential to Achieve Goals: Good  Plan      Co-evaluation                 AM-PAC OT 6 Clicks Daily Activity     Outcome Measure   Help from another person eating meals?: None Help from another person taking care of personal grooming?: A Little Help from another person toileting, which includes using toliet, bedpan, or urinal?: A Little Help from another person bathing (including washing, rinsing, drying)?: A Lot Help from another person to put on and taking off regular upper body clothing?: A Little Help from another person to put on and taking off regular lower body clothing?: A Lot 6 Click Score: 17    End of Session Equipment Utilized During Treatment: Rolling walker (2 wheels)  OT  Visit Diagnosis: Other abnormalities of gait and mobility (R26.89);Muscle weakness (generalized) (M62.81)   Activity Tolerance Patient tolerated treatment well   Patient Left in bed;with call bell/phone within reach;with bed alarm set   Nurse Communication Mobility status        Time: 1405-1430 OT Time Calculation (min): 25 min  Charges: OT General Charges $OT Visit: 1 Visit OT Treatments $Self Care/Home Management : 23-37 mins  Edelmiro Innocent Chrismon, OTR/L  04/20/24, 2:55 PM   Arshia Spellman E Chrismon 04/20/2024, 2:52 PM

## 2024-04-20 NOTE — Progress Notes (Signed)
 D/C order noted. Contacted [facility] to be advised of pt and d/c today. Requested documents faxed to clinic for continuation of care.  Suzen Satchel Dialysis Navigator [phone]

## 2024-04-20 NOTE — TOC Transition Note (Signed)
 Transition of Care [facility]) - Discharge Note   Patient Details  Name: Luis Miller. MRN: [id number] Date of Birth: Dec 24, 1945  [facility] CM/SW Contact:  Lauraine JAYSON Carpen, LCSW Phone Number: 04/20/2024, 1:36 PM   Clinical Narrative:  Patient has orders to discharge to [facility] today. RN will call report to [phone] (Room 605B). [facility] has been arranged and he is 10th on the list. No further concerns. CSW signing off.   Final next level of care: Skilled Nursing Facility Barriers to Discharge: Barriers Resolved   Patient Goals and CMS Choice Patient states their goals for this hospitalization and ongoing recovery are:: Patient not fully oriented.   Choice offered to / list presented to : Patient, Adult Children      Discharge Placement   Existing PASRR number confirmed : 04/16/24          Patient chooses bed at: [facility] [place] Patient to be transferred to facility by: [facility] Name of family member notified: Charlie Launie Patient and family notified of of transfer: 04/20/24  Discharge Plan and Services Additional resources added to the After Visit Summary for       Post Acute Care Choice: Skilled Nursing Facility                               Social Drivers of Health (SDOH) Interventions SDOH Screenings   Food Insecurity: No Food Insecurity (04/11/2024)  Housing: Low Risk (04/11/2024)  Transportation Needs: No Transportation Needs (04/11/2024)  Utilities: Not At Risk (04/11/2024)  Alcohol Screen: Low Risk (09/30/2022)  Depression (PHQ2-9): Low Risk (01/18/2024)  Financial Resource Strain: Low Risk  (01/20/2024)   Received from [facility]  Physical Activity: Insufficiently Active (12/07/2021)  Social Connections: Socially Isolated (04/11/2024)  Stress: No Stress Concern Present (12/07/2021)  Tobacco Use: Medium Risk (04/18/2024)     Readmission Risk Interventions      No data to display

## 2024-04-20 NOTE — Progress Notes (Signed)
 " [facility]  ROUNDING NOTE   Subjective:   Luis Miller. is a 79 year old male with end-stage renal disease on hemodialysis, hypertension, gout, depression, history of melanoma, Crohn's disease, history of stroke, bipolar disorder. 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 SABRA Update:   Patient seen and evaluated during dialysis   HEMODIALYSIS FLOWSHEET:  Blood Flow Rate (mL/min): 349 mL/min Arterial Pressure (mmHg): -192.52 mmHg Venous Pressure (mmHg): 266.45 mmHg TMP (mmHg): 16.77 mmHg Ultrafiltration Rate (mL/min): 771 mL/min Dialysate Flow Rate (mL/min): 299 ml/min Dialysis Fluid Bolus: Normal Saline Bolus Amount (mL): 100 mL  Resting comfortably No complaints to offer  Objective:  Vital signs in last 24 hours:  Temp:  [97.9 F (36.6 C)-98.7 F (37.1 C)] 98.1 F (36.7 C) (01/30 0818) Pulse Rate:  [46-69] 46 (01/30 1000) Resp:  [14-20] 17 (01/30 1000) BP: (93-135)/(56-98) 96/56 (01/30 1000) SpO2:  [94 %-100 %] 100 % (01/30 1000) Weight:  [82.9 kg] 82.9 kg (01/30 0820)  Weight change: -1.8 kg Filed Weights   04/19/24 0500 04/20/24 0500 04/20/24 0820  Weight: 82 kg 82.9 kg 82.9 kg    Intake/Output: No intake/output data recorded.   Intake/Output this shift:  No intake/output data recorded.  Physical Exam: General: NAD, resting comfortably, flat affect  Head: Normocephalic  Eyes: Anicteric  Lungs:  Clear to auscultation, O2 [place]  Heart: Regular rate and rhythm, bradycardic  Abdomen:  Soft, nontender  Extremities:  Trace peripheral edema.  Neurologic: Awake, alert  Skin: Warm,dry, no rash   Access: Left AVF    Basic Metabolic Panel: Recent Labs  Lab 04/15/24 0628 04/16/24 0003 04/17/24 0327 04/18/24 0535 04/18/24 2244 04/20/24 0828  NA 137 137 137 137 136 135  K 3.7 3.7 3.9 3.7 4.0 3.7  CL 97* 97* 97* 96* 96* 96*  CO2 25 25 23 27 26 25   GLUCOSE 102* 93 96 115* 121* 103*  BUN 54* 68* 80* 51* 31* 54*  CREATININE 6.49* 7.75* 8.79* 6.50* 5.05* 7.87*  CALCIUM  9.9 10.1 9.4 9.3 9.0 9.4  MG 2.5* 2.6* 2.7* 2.5* 2.2  --   PHOS 5.5* 6.3* 6.5* 5.5*  --  6.1*    Liver Function Tests: Recent Labs  Lab 04/15/24 0628 04/16/24 0003 04/17/24 0327 04/18/24 0535 04/18/24 2244 04/20/24 0828  AST 25 23 30 26 27   --   ALT 10 12 18 16 17   --   ALKPHOS 65 64 69 63 63  --   BILITOT 0.5 0.4 0.3 0.3 0.3  --   PROT 6.2* 6.4* 6.0* 6.3* 6.2*  --   ALBUMIN 3.4* 3.5 3.5 3.6 3.5 3.6   No results for input(s): LIPASE, AMYLASE in the last 168 hours. No results for input(s): AMMONIA in the last 168 hours.  CBC: Recent Labs  Lab 04/15/24 0628 04/16/24 0003 04/17/24 0327 04/18/24 0535 04/20/24 0828  WBC 7.1 7.1 6.3 7.0 6.5  HGB 9.6* 9.4* 9.2* 10.0* 9.2*  HCT 28.9* 28.5* 28.1* 30.9* 28.2*  MCV 102.1* 101.1* 103.3* 104.7* 102.5*  PLT 204 221 209 205 204    Cardiac Enzymes: No results for input(s): CKTOTAL, CKMB, CKMBINDEX, TROPONINI in the last 168 hours.   BNP: Invalid input(s):  POCBNP  CBG: Recent Labs  Lab 04/18/24 1251  GLUCAP 104*    Microbiology: Results for orders placed or performed in visit on 05/25/23  Microscopic Examination     Status: Abnormal   Collection Time: 05/25/23  4:05 PM   Urine  Result Value Ref Range Status   WBC, UA 11-30 (A) 0 - 5 /hpf Final   RBC, Urine 3-10 (A) 0 - 2 /hpf Final   Epithelial Cells (non renal) 0-10 0 - 10 /hpf Final   Casts Present (A) None seen /lpf Final   Cast Type Hyaline casts N/A Final   Mucus, UA Present (A) Not Estab. Final   Bacteria, UA Moderate (A) None seen/Few Final    Coagulation  Studies: No results for input(s): LABPROT, INR in the last 72 hours.   Urinalysis: No results for input(s): COLORURINE, LABSPEC, PHURINE, GLUCOSEU, HGBUR, BILIRUBINUR, KETONESUR, PROTEINUR, UROBILINOGEN, NITRITE, LEUKOCYTESUR in the last 72 hours.  Invalid input(s): APPERANCEUR     Imaging: ECHO TEE Result Date: 04/18/2024    TRANSESOPHOGEAL ECHO REPORT   Patient Name:   Luis Miller. Date of Exam: 04/18/2024 Medical Rec #:  [id number]          Height:       66.0 in Accession #:    [id number]         Weight:       186.7 lb Date of Birth:  06-06-1945          BSA:          1.942 m Patient Age:    78 years           BP:           107/84 mmHg Patient Gender: M                  HR:           110 bpm. Exam Location:  [facility] Procedure: Transesophageal Echo, Cardiac Doppler, Color Doppler and Saline            Contrast Bubble Study (Both Spectral and Color Flow Doppler were            utilized during procedure). Indications:     Atrial Flutter  History:         Patient has prior history of Echocardiogram examinations, most                  recent 04/14/2024. Risk Factors:Hypertension and Dyslipidemia.  Sonographer:     Christopher Furnace Referring Phys:  [id number] SHERI HAMMOCK Diagnosing Phys: Evalene Lunger MD PROCEDURE: After discussion of the risks and benefits of a TEE, an informed consent was obtained from the patient. TEE procedure time was 30 minutes. The transesophogeal probe was passed without difficulty through the esophogus of the patient. Imaged were obtained with the patient in a left lateral decubitus position. Local oropharyngeal anesthetic was provided with Cetacaine  and viscous lidocaine . Sedation performed by different physician. Image quality was excellent. The patient's vital signs; including heart rate, blood pressure, and oxygen saturation; remained stable throughout the procedure. The patient developed no complications during the procedure. A successful direct current  cardioversion was performed at 200 joules with 1 attempt.  IMPRESSIONS  1. Left ventricular ejection fraction, by estimation, is 40 to 45%. The left ventricle has mildly decreased function. The left ventricle demonstrates global hypokinesis.  2. Right ventricular systolic function is mildly reduced. The right ventricular size is normal.  3. No left atrial/left  atrial appendage thrombus was detected.  4. The mitral valve is normal in structure. Mild mitral valve regurgitation. No evidence of mitral stenosis.  5. The aortic valve is normal in structure. Aortic valve regurgitation is not visualized. No aortic stenosis is present.  6. The inferior vena cava is normal in size with greater than 50% respiratory variability, suggesting right atrial pressure of 3 mmHg.  7. Agitated saline contrast bubble study was negative, with no evidence of any interatrial shunt.  8. Cardioversion to follow Conclusion(s)/Recommendation(s): FINDINGS  Left Ventricle: Left ventricular ejection fraction, by estimation, is 40 to 45%. The left ventricle has mildly decreased function. The left ventricle demonstrates global hypokinesis. The left ventricular internal cavity size was normal in size. There is  no left ventricular hypertrophy. Right Ventricle: The right ventricular size is normal. No increase in right ventricular wall thickness. Right ventricular systolic function is mildly reduced. Left Atrium: Left atrial size was normal in size. No left atrial/left atrial appendage thrombus was detected. Right Atrium: Right atrial size was normal in size. Pericardium: There is no evidence of pericardial effusion. Mitral Valve: The mitral valve is normal in structure. Mild mitral valve regurgitation. No evidence of mitral valve stenosis. Tricuspid Valve: The tricuspid valve is normal in structure. Tricuspid valve regurgitation is not demonstrated. No evidence of tricuspid stenosis. Aortic Valve: The aortic valve is normal in structure. Aortic valve  regurgitation is not visualized. No aortic stenosis is present. Pulmonic Valve: The pulmonic valve was normal in structure. Pulmonic valve regurgitation is not visualized. No evidence of pulmonic stenosis. Aorta: The aortic root is normal in size and structure. Venous: The inferior vena cava is normal in size with greater than 50% respiratory variability, suggesting right atrial pressure of 3 mmHg. IAS/Shunts: No atrial level shunt detected by color flow Doppler. Agitated saline contrast was given intravenously to evaluate for intracardiac shunting. Agitated saline contrast bubble study was negative, with no evidence of any interatrial shunt. There  is no evidence of a patent foramen ovale. There is no evidence of an atrial septal defect. Additional Comments: 3D was performed not requiring image post processing on an independent workstation and was indeterminate. Timothy Gollan MD Electronically signed by Timothy Gollan MD Signature Date/Time: 04/18/2024/4:23:54 PM    Final       Medications:      apixaban   5 mg Oral BID   docusate sodium   100 mg Oral BID   epoetin  alfa-epbx (RETACRIT ) injection  4,000 Units Intravenous Q M,W,F-1800   feeding supplement (NEPRO CARB STEADY)  237 mL Oral BID BM   gabapentin   100 mg Oral Daily   hydrocerin  1 Application Topical BID   lithium  carbonate  300 mg Oral Q supper   loratadine   10 mg Oral Daily   multivitamin with minerals  1 tablet Oral Daily   primidone   25 mg Oral QHS   rosuvastatin   10 mg Oral Daily   acetaminophen , albuterol , dextromethorphan-guaiFENesin , heparin , hydrALAZINE , lidocaine -prilocaine , ondansetron  (ZOFRAN ) IV, pentafluoroprop-tetrafluoroeth  Assessment/ Plan:  [name]. is a 79 y.o.  male with end-stage renal disease on hemodialysis, hypertension, gout, depression, history of melanoma, Crohn's disease, history of stroke, bipolar disorder. 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   Receiving dialysis  today, UF goal 1.5L as tolerated. Next treatment scheduled for Monday.   2. Anemia of chronic kidney disease Lab Results  Component Value Date   HGB 9.2 (L) 04/20/2024    Hgb 9.2. Continue low dose retacrit  4000 units with dialysis   3. Secondary Hyperparathyroidism: with outpatient labs: PTH 235, phosphorus 6.0, calcium  10.4 on 04/13/23.   Lab Results  Component Value Date   CALCIUM  9.4 04/20/2024   CAION 1.17 11/05/2021   PHOS 6.1 (H) 04/20/2024    Hyperphosphatemia noted. Elevated today, 6.1. Patient prescribed sevelamer  outpatient. Will continue this with meals.   4. Fall, unwitnessed. Imaging negative for acute fracture. PT/OT recommend rehab. Seeking placement at [facility].    LOS: 8 Jobie Popp 1/30/202610:48 AM   "

## 2024-04-26 ENCOUNTER — Other Ambulatory Visit
Admission: RE | Admit: 2024-04-26 | Discharge: 2024-04-26 | Disposition: A | Source: Ambulatory Visit | Attending: Nephrology | Admitting: Nephrology

## 2024-05-01 ENCOUNTER — Ambulatory Visit

## 2024-05-18 ENCOUNTER — Ambulatory Visit: Admitting: Medical

## 2024-05-22 ENCOUNTER — Encounter (INDEPENDENT_AMBULATORY_CARE_PROVIDER_SITE_OTHER)

## 2024-05-22 ENCOUNTER — Ambulatory Visit (INDEPENDENT_AMBULATORY_CARE_PROVIDER_SITE_OTHER): Admitting: Nurse Practitioner

## 2024-05-29 ENCOUNTER — Other Ambulatory Visit
